# Patient Record
Sex: Male | Born: 1940
Health system: Southern US, Community
[De-identification: ages and names within clinical notes are randomized; demographics above are authoritative.]

## PROBLEM LIST (undated history)

## (undated) DIAGNOSIS — F1010 Alcohol abuse, uncomplicated: Secondary | ICD-10-CM

## (undated) DIAGNOSIS — R002 Palpitations: Secondary | ICD-10-CM

## (undated) DIAGNOSIS — R0600 Dyspnea, unspecified: Secondary | ICD-10-CM

## (undated) DIAGNOSIS — N529 Male erectile dysfunction, unspecified: Secondary | ICD-10-CM

## (undated) DIAGNOSIS — N400 Enlarged prostate without lower urinary tract symptoms: Secondary | ICD-10-CM

## (undated) DIAGNOSIS — K219 Gastro-esophageal reflux disease without esophagitis: Secondary | ICD-10-CM

## (undated) DIAGNOSIS — Z9289 Personal history of other medical treatment: Secondary | ICD-10-CM

## (undated) DIAGNOSIS — I4892 Unspecified atrial flutter: Secondary | ICD-10-CM

## (undated) DIAGNOSIS — R972 Elevated prostate specific antigen [PSA]: Secondary | ICD-10-CM

## (undated) DIAGNOSIS — I779 Disorder of arteries and arterioles, unspecified: Secondary | ICD-10-CM

## (undated) DIAGNOSIS — M1990 Unspecified osteoarthritis, unspecified site: Secondary | ICD-10-CM

## (undated) DIAGNOSIS — C61 Malignant neoplasm of prostate: Secondary | ICD-10-CM

## (undated) DIAGNOSIS — I10 Essential (primary) hypertension: Secondary | ICD-10-CM

## (undated) DIAGNOSIS — J189 Pneumonia, unspecified organism: Secondary | ICD-10-CM

## (undated) DIAGNOSIS — C801 Malignant (primary) neoplasm, unspecified: Secondary | ICD-10-CM

## (undated) DIAGNOSIS — N281 Cyst of kidney, acquired: Secondary | ICD-10-CM

## (undated) DIAGNOSIS — E785 Hyperlipidemia, unspecified: Secondary | ICD-10-CM

## (undated) DIAGNOSIS — J449 Chronic obstructive pulmonary disease, unspecified: Secondary | ICD-10-CM

## (undated) DIAGNOSIS — I2510 Atherosclerotic heart disease of native coronary artery without angina pectoris: Secondary | ICD-10-CM

## (undated) HISTORY — DX: Chronic obstructive pulmonary disease, unspecified: J44.9

## (undated) HISTORY — DX: Essential (primary) hypertension: I10

## (undated) HISTORY — DX: Malignant neoplasm of prostate: C61

## (undated) HISTORY — PX: CATARACT EXTRACTION, BILATERAL: SHX1313

## (undated) HISTORY — DX: Elevated prostate specific antigen (PSA): R97.20

## (undated) HISTORY — PX: EYE SURGERY: SHX253

## (undated) HISTORY — DX: Disorder of arteries and arterioles, unspecified: I77.9

## (undated) HISTORY — DX: Personal history of other medical treatment: Z92.89

## (undated) HISTORY — DX: Unspecified atrial flutter: I48.92

## (undated) HISTORY — DX: Alcohol abuse, uncomplicated: F10.10

## (undated) HISTORY — DX: Palpitations: R00.2

## (undated) HISTORY — DX: Male erectile dysfunction, unspecified: N52.9

## (undated) HISTORY — DX: Atherosclerotic heart disease of native coronary artery without angina pectoris: I25.10

## (undated) HISTORY — DX: Hyperlipidemia, unspecified: E78.5

## (undated) HISTORY — DX: Benign prostatic hyperplasia without lower urinary tract symptoms: N40.0

## (undated) HISTORY — DX: Cyst of kidney, acquired: N28.1

## (undated) HISTORY — PX: MULTIPLE TOOTH EXTRACTIONS: SHX2053

## (undated) HISTORY — PX: TONSILLECTOMY: SUR1361

## (undated) HISTORY — DX: Gastro-esophageal reflux disease without esophagitis: K21.9

## (undated) HISTORY — PX: KNEE ARTHROSCOPY: SUR90

---

## 1999-09-12 ENCOUNTER — Emergency Department (HOSPITAL_COMMUNITY): Admission: EM | Admit: 1999-09-12 | Discharge: 1999-09-12 | Payer: Self-pay

## 2000-07-30 HISTORY — PX: CARDIAC CATHETERIZATION: SHX172

## 2001-06-02 ENCOUNTER — Ambulatory Visit (HOSPITAL_COMMUNITY): Admission: RE | Admit: 2001-06-02 | Discharge: 2001-06-02 | Payer: Self-pay | Admitting: *Deleted

## 2003-04-28 ENCOUNTER — Encounter: Admission: RE | Admit: 2003-04-28 | Discharge: 2003-04-28 | Payer: Self-pay | Admitting: Otolaryngology

## 2003-04-28 ENCOUNTER — Encounter: Payer: Self-pay | Admitting: Otolaryngology

## 2006-04-03 ENCOUNTER — Emergency Department (HOSPITAL_COMMUNITY): Admission: EM | Admit: 2006-04-03 | Discharge: 2006-04-03 | Payer: Self-pay | Admitting: Emergency Medicine

## 2006-12-25 ENCOUNTER — Encounter: Admission: RE | Admit: 2006-12-25 | Discharge: 2006-12-25 | Payer: Self-pay | Admitting: Orthopedic Surgery

## 2007-01-08 ENCOUNTER — Encounter: Admission: RE | Admit: 2007-01-08 | Discharge: 2007-01-08 | Payer: Self-pay | Admitting: Orthopedic Surgery

## 2008-02-09 ENCOUNTER — Encounter: Admission: RE | Admit: 2008-02-09 | Discharge: 2008-02-09 | Payer: Self-pay | Admitting: Otolaryngology

## 2009-06-26 ENCOUNTER — Emergency Department (HOSPITAL_COMMUNITY): Admission: EM | Admit: 2009-06-26 | Discharge: 2009-06-27 | Payer: Self-pay | Admitting: Emergency Medicine

## 2009-11-22 ENCOUNTER — Ambulatory Visit (HOSPITAL_COMMUNITY): Admission: RE | Admit: 2009-11-22 | Discharge: 2009-11-22 | Payer: Self-pay | Admitting: Orthopedic Surgery

## 2010-10-17 LAB — COMPREHENSIVE METABOLIC PANEL
Albumin: 4.1 g/dL (ref 3.5–5.2)
Alkaline Phosphatase: 93 U/L (ref 39–117)
BUN: 8 mg/dL (ref 6–23)
Chloride: 101 mEq/L (ref 96–112)
Glucose, Bld: 91 mg/dL (ref 70–99)
Potassium: 4.1 mEq/L (ref 3.5–5.1)
Total Bilirubin: 0.9 mg/dL (ref 0.3–1.2)

## 2010-10-17 LAB — DIFFERENTIAL
Basophils Absolute: 0 10*3/uL (ref 0.0–0.1)
Basophils Relative: 0 % (ref 0–1)
Eosinophils Absolute: 0.1 10*3/uL (ref 0.0–0.7)
Monocytes Absolute: 0.6 10*3/uL (ref 0.1–1.0)
Neutro Abs: 7.2 10*3/uL (ref 1.7–7.7)
Neutrophils Relative %: 77 % (ref 43–77)

## 2010-10-17 LAB — URINALYSIS, ROUTINE W REFLEX MICROSCOPIC
Bilirubin Urine: NEGATIVE
Hgb urine dipstick: NEGATIVE
Ketones, ur: NEGATIVE mg/dL
Nitrite: NEGATIVE
pH: 7.5 (ref 5.0–8.0)

## 2010-10-17 LAB — CBC
HCT: 51.6 % (ref 39.0–52.0)
Hemoglobin: 17.5 g/dL — ABNORMAL HIGH (ref 13.0–17.0)
WBC: 9.4 10*3/uL (ref 4.0–10.5)

## 2010-10-17 LAB — APTT: aPTT: 31 seconds (ref 24–37)

## 2010-12-04 ENCOUNTER — Emergency Department (HOSPITAL_COMMUNITY)
Admission: EM | Admit: 2010-12-04 | Discharge: 2010-12-04 | Disposition: A | Discharge status: Home / Self Care | Payer: Medicare Other | Attending: Emergency Medicine | Admitting: Emergency Medicine

## 2010-12-04 DIAGNOSIS — I491 Atrial premature depolarization: Secondary | ICD-10-CM | POA: Insufficient documentation

## 2010-12-04 DIAGNOSIS — K219 Gastro-esophageal reflux disease without esophagitis: Secondary | ICD-10-CM | POA: Insufficient documentation

## 2010-12-04 DIAGNOSIS — E785 Hyperlipidemia, unspecified: Secondary | ICD-10-CM | POA: Insufficient documentation

## 2010-12-04 DIAGNOSIS — F411 Generalized anxiety disorder: Secondary | ICD-10-CM | POA: Insufficient documentation

## 2010-12-04 DIAGNOSIS — F1010 Alcohol abuse, uncomplicated: Secondary | ICD-10-CM | POA: Insufficient documentation

## 2010-12-04 LAB — CBC
HCT: 44.1 % (ref 39.0–52.0)
Hemoglobin: 15.7 g/dL (ref 13.0–17.0)
MCH: 34.7 pg — ABNORMAL HIGH (ref 26.0–34.0)
MCHC: 35.6 g/dL (ref 30.0–36.0)
MCV: 97.6 fL (ref 78.0–100.0)
RDW: 14.2 % (ref 11.5–15.5)

## 2010-12-04 LAB — DIFFERENTIAL
Basophils Absolute: 0.1 10*3/uL (ref 0.0–0.1)
Eosinophils Relative: 2 % (ref 0–5)
Lymphocytes Relative: 20 % (ref 12–46)
Lymphs Abs: 1.3 10*3/uL (ref 0.7–4.0)
Monocytes Absolute: 0.7 10*3/uL (ref 0.1–1.0)
Monocytes Relative: 11 % (ref 3–12)

## 2010-12-04 LAB — COMPREHENSIVE METABOLIC PANEL
BUN: 8 mg/dL (ref 6–23)
CO2: 29 mEq/L (ref 19–32)
Calcium: 9 mg/dL (ref 8.4–10.5)
GFR calc non Af Amer: >60 mL/min (ref >60)
Glucose, Bld: 67 mg/dL — ABNORMAL LOW (ref 70–99)
Total Protein: 6.5 g/dL (ref 6.0–8.3)

## 2010-12-04 LAB — RAPID URINE DRUG SCREEN, HOSP PERFORMED
Barbiturates: NOT DETECTED
Benzodiazepines: NOT DETECTED
Cocaine: NOT DETECTED

## 2010-12-04 LAB — ETHANOL: Alcohol, Ethyl (B): <11 mg/dL — ABNORMAL HIGH (ref 0–10)

## 2010-12-15 NOTE — Cardiovascular Report (Signed)
[place]  Patient:    SAVAN, RUTA Visit Number: [id number] MRN: [id number]          Service Type: CAT Location: [facility] 2874 [id number] Attending Physician:  Meade Maw A Dictated by:   Lemont Fillers Fraser Din, M.D. Proc. Date: 06/02/01 Admit Date:  06/02/2001 Discharge Date: 06/02/2001   CC:         Pearla Dubonnet, M.D.   Cardiac Catheterization  REFERRING PHYSICIAN: Pearla Dubonnet, M.D.  INDICATIONS FOR PROCEDURE: Positive stress test with development of jaw pain at 4 minutes into the test with nonspecific ST changes up to 1 mm horizontal ST depression at peak exercise.  DESCRIPTION OF PROCEDURE: After obtaining written informed consent, the patient was brought to the cardiac catheterization lab in the postabsorptive state. Preoperative sedation was achieved with IV Versed. The right groin was was prepped and draped in the usual sterile fashion. Local anesthesia was achieved using 1% Xylocaine. A 6 French hemostasis sheath was placed into the right femoral artery using a modified Seldinger technique.  Selective coronary angiography was performed using a JL4, JR4 Judkins catheters. Multiple views were obtained. All catheter exchange were made over a guide wire. Single plane ventriculogram was performed in the RAO position using a 6 French pigtail curved catheter.  FINDINGS: The aortic pressure was 123/66, LV pressure was 128/17. There was no gradient noted on pullback. Single plane ventriculogram revealed normal wall motion with an ejection fraction of 65%. There was no MR noted.  CORONARY ANGIOGRAPHY: Left main coronary artery: The left main coronary artery bifurcated into the left anterior descending and circumflex vessel. There was no significant disease in the left main coronary artery.  Left anterior descending: The left anterior descending gives rise to a small diagonal #1, large bifurcating diagonal #2, and goes on to end as an  apical branch. 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  Circumflex vessel: The circumflex vessel gives rise to a large bifurcating OM-1 and then ends as an AV groove vessel. There is no significant disease in the circumflex or its branches.  Right coronary artery: The right coronary artery is a large dominant artery giving rise to several RV marginals, PDA and PL branch. There is a long proximal lesion of up to 30 mm noted in the right coronary artery of 50% stenosis.  IMPRESSION: Borderline proximal disease in the right coronary artery. The films were reviewed with Dr. Corliss Marcus. The disease did not appear to be critical for intervention and it was felt that percutaneous revascularization with a long stent placement would result in more harm that good.  RECOMMENDATIONS: To continue with medical therapy and to keep LDL less than 100. Should he have significant ongoing pain which inhibits his ability to exercise, we will perform a stress Cardiolite for further evaluation. If the territory of the right coronary artery is noted to have problems, we will consider further intervention in that region. Dictated by:   Lemont Fillers Fraser Din, M.D. Attending Physician:  Meade Maw A DD:  06/02/01 TD:  06/03/01 Job: [id number]

## 2012-07-09 ENCOUNTER — Encounter (INDEPENDENT_AMBULATORY_CARE_PROVIDER_SITE_OTHER): Payer: Medicare Other | Admitting: Ophthalmology

## 2012-07-14 ENCOUNTER — Encounter (INDEPENDENT_AMBULATORY_CARE_PROVIDER_SITE_OTHER): Payer: Medicare Other | Admitting: Ophthalmology

## 2012-07-14 DIAGNOSIS — H43819 Vitreous degeneration, unspecified eye: Secondary | ICD-10-CM

## 2012-07-14 DIAGNOSIS — D3130 Benign neoplasm of unspecified choroid: Secondary | ICD-10-CM

## 2012-07-14 DIAGNOSIS — H33309 Unspecified retinal break, unspecified eye: Secondary | ICD-10-CM

## 2012-07-16 ENCOUNTER — Encounter (INDEPENDENT_AMBULATORY_CARE_PROVIDER_SITE_OTHER): Payer: Medicare Other | Admitting: Ophthalmology

## 2012-07-16 DIAGNOSIS — H33309 Unspecified retinal break, unspecified eye: Secondary | ICD-10-CM

## 2012-08-01 ENCOUNTER — Ambulatory Visit (INDEPENDENT_AMBULATORY_CARE_PROVIDER_SITE_OTHER): Payer: Medicare Other | Admitting: Ophthalmology

## 2012-08-01 DIAGNOSIS — H33309 Unspecified retinal break, unspecified eye: Secondary | ICD-10-CM

## 2012-12-01 ENCOUNTER — Ambulatory Visit (INDEPENDENT_AMBULATORY_CARE_PROVIDER_SITE_OTHER): Payer: Medicare Other | Admitting: Ophthalmology

## 2013-02-02 ENCOUNTER — Ambulatory Visit (INDEPENDENT_AMBULATORY_CARE_PROVIDER_SITE_OTHER): Payer: Self-pay | Admitting: Ophthalmology

## 2013-07-15 ENCOUNTER — Encounter: Payer: Self-pay | Admitting: Podiatry

## 2013-07-15 ENCOUNTER — Ambulatory Visit (INDEPENDENT_AMBULATORY_CARE_PROVIDER_SITE_OTHER): Payer: Medicare Other | Admitting: Podiatry

## 2013-07-15 VITALS — Ht 71.0 in | Wt 170.0 lb

## 2013-07-15 DIAGNOSIS — Q828 Other specified congenital malformations of skin: Secondary | ICD-10-CM

## 2013-07-15 NOTE — Progress Notes (Signed)
Patient ID: [name] [name], male   DOB: [birth year], 72 y.o.   MRN: [id number]  Subjective: This patient presents for ongoing debridement of a painful skin lesion on the plantar aspect of the left foot.  Objective: Hallux limitus left. Nucleated hyperkeratotic lesion plantar interphalangeal joint left hallux.  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  Plan: The hyperkeratotic tissue was debrided, packed with salinocaine. Return at patient request.

## 2013-10-05 ENCOUNTER — Ambulatory Visit: Payer: Medicare Other | Admitting: Podiatry

## 2014-09-28 ENCOUNTER — Other Ambulatory Visit: Payer: Self-pay | Admitting: Internal Medicine

## 2014-09-28 DIAGNOSIS — N281 Cyst of kidney, acquired: Secondary | ICD-10-CM

## 2014-09-29 ENCOUNTER — Ambulatory Visit
Admission: RE | Admit: 2014-09-29 | Discharge: 2014-09-29 | Disposition: A | Discharge status: Home / Self Care | Payer: Medicare Other | Source: Ambulatory Visit | Attending: Internal Medicine | Admitting: Internal Medicine

## 2014-09-29 DIAGNOSIS — N281 Cyst of kidney, acquired: Secondary | ICD-10-CM

## 2015-04-25 ENCOUNTER — Other Ambulatory Visit: Payer: Self-pay | Admitting: Acute Care

## 2015-04-25 ENCOUNTER — Encounter: Payer: Self-pay | Admitting: Acute Care

## 2015-04-25 DIAGNOSIS — F17210 Nicotine dependence, cigarettes, uncomplicated: Secondary | ICD-10-CM

## 2015-04-25 NOTE — Progress Notes (Signed)
Patient ID: [name] [name], male   DOB: October 28, 1940, 74 y.o.   MRN: [id number]     Cardiology Office Note   Date:  04/25/2015   ID:  [name] [name], DOB 1941-07-14, MRN [id number]  PCP:  Henrine Screws, MD  Cardiologist:   Jenkins Rouge, MD   No chief complaint on file.     History of Present Illness: [name] [name] is a 74 y.o. male who presents for evaluation of CAD.  As far as I can tell has not been evaluated in a long time  Seen by Keturah Barre In 2002.  Reviewed cath and he only had a 50% proximal RCA lesion that was long.  CRF;s elevated lipids on statin , HTN and smoking  35 pack year history Referred To radiology for low dose CT screening.  No chest pain but progressively more exertional dyspnea COPD described as severe with multiple inhalers.  Last [facility] cardiology Notes we could find showed normal myovue in 06/21/2003 with EF 68%  ECG portion of exercise stress test also reviewed and normal with 6 minutes of exercise Max HR not Reported   Limited by COPD just started on albuterol and advair with significant improvement.  Unable to walk on treadmill due to chronic left foot pain and great toe deformity. Has had an episode of SSCP while golfing 3 weeks ago.  Self limited central radiated to back Resolved spontaneously no pleuritic or positional component     Past Medical History  Diagnosis Date  . GERD (gastroesophageal reflux disease)   . COPD (chronic obstructive pulmonary disease)   . Coronary artery disease   . Hypertension   . Alcohol abuse   . Hyperlipidemia   . Elevated PSA   . BPH with elevated PSA   . Kidney cysts   . ED (erectile dysfunction)   . Palpitations     Past Surgical History  Procedure Laterality Date  . Tonsillectomy    . Multiple tooth extractions    . Cardiac catheterization  2002    50% RCA  . Knee arthroscopy    . Cataract extraction, bilateral       Current Outpatient Prescriptions    Medication Sig Dispense Refill  . albuterol (PROAIR HFA) 108 (90 BASE) MCG/ACT inhaler Inhale 2 puffs into the lungs every 4 (four) hours as needed. Breathing problems    . EPINEPHrine 0.3 mg/0.3 mL IJ SOAJ injection Inject 0.3 mg as directed once. Use as directed    . esomeprazole (NEXIUM) 20 MG capsule Take 20 mg by mouth daily at 12 noon.    . fluticasone-salmeterol (ADVAIR HFA) 115-21 MCG/ACT inhaler Inhale 2 puffs into the lungs daily.    . nitroGLYCERIN (NITROSTAT) 0.4 MG SL tablet Place 1 tablet under the tongue. Every 5 minutes, max 3    . rosuvastatin (CRESTOR) 5 MG tablet Take 10 mg by mouth daily.    Marland Kitchen Umeclidinium Bromide (INCRUSE ELLIPTA) 62.5 MCG/INH AEPB Inhale 1 puff into the lungs as needed.      No current facility-administered medications for this visit.    Allergies:   Flagyl     Social History:  The patient  reports that he has been smoking Cigarettes.  He has a 40 pack-year smoking history. He has never used smokeless tobacco. He reports that he does not drink alcohol or use illicit drugs.   Family History:  The patient's family history includes Alzheimer's disease in his mother; Hypertension in his mother.  ROS:  Please see the history of present illness.   Otherwise, review of systems are positive for none.   All other systems are reviewed and negative.    PHYSICAL EXAM: VS:  There were no vitals taken for this visit. , BMI There is no weight on file to calculate BMI. Affect appropriate Healthy:  appears stated age 58: normal Neck supple with no adenopathy JVP normal no bruits no thyromegaly Lungs Poor air movement exp wheezing and good diaphragmatic motion Heart:  S1/S2 no murmur, no rub, gallop or click PMI normal Abdomen: benighn, BS positve, no tenderness, no AAA no bruit.  No HSM or HJR Distal pulses intact with no bruits No edema Neuro non-focal Skin warm and dry No muscular weakness    EKG:   2015 SR rate 83  PAC otherwise normal    04/26/15  SR rate 78  RAD pulmonary disease pattern    Recent Labs: No results found for requested labs within last 365 days.    Lipid Panel No results found for: CHOL, TRIG, HDL, CHOLHDL, VLDL, LDLCALC, LDLDIRECT    Wt Readings from Last 3 Encounters:  07/15/13 77.111 kg (170 lb)      Other studies Reviewed: Additional studies/ records that were reviewed today include:  [facility] notes echo 2012 and myovue 2014 .    ASSESSMENT AND PLAN:  1.  Chest pain  Cath 2002 with 50% RCA.  Still smoking Isolated episode. ECG ok  F/u lexiscan myovue. 2. COPD long talk about smoking and ongoing risk of cancer and need for oxgyen.  He is getting a low dose screening CT Breathing much better with advair and albuterol 3. Chol:  On statin f/u labs primary   Current medicines are reviewed at length with the patient today.  The patient does not have concerns regarding medicines.  The following changes have been made:  no change  Labs/ tests ordered today include: Lexiscan myovue  No orders of the defined types were placed in this encounter.     Disposition:   FU with me in a  Year if myovue normal      Signed, Jenkins Rouge, MD  04/25/2015 3:19 PM    [facility] [place], [place], [place]  [address] Phone: [phone]; Fax: [phone]

## 2015-04-26 ENCOUNTER — Encounter: Payer: Self-pay | Admitting: Cardiovascular Disease

## 2015-04-26 ENCOUNTER — Ambulatory Visit (INDEPENDENT_AMBULATORY_CARE_PROVIDER_SITE_OTHER): Payer: Medicare Other | Admitting: Cardiovascular Disease

## 2015-04-26 VITALS — BP 106/54 | HR 78 | Ht 70.0 in | Wt 163.4 lb

## 2015-04-26 DIAGNOSIS — I998 Other disorder of circulatory system: Secondary | ICD-10-CM

## 2015-04-26 NOTE — Patient Instructions (Addendum)
Medication Instructions:  Your physician recommends that you continue on your current medications as directed. Please refer to the Current Medication list given to you today.  Labwork: NONE Testing/Procedures: Your physician has requested that you have a lexiscan myoview. For further information please visit [email or web address]. Please follow instruction sheet, as given.   Follow-Up: Your physician wants you to follow-up TC:YELY  WITH  DR Blima Singer will receive a reminder letter in the mail two months in advance. If you don't receive a letter, please call our office to schedule the follow-up appointment.   Any Other Special Instructions Will Be Listed Below (If Applicable).

## 2015-05-04 ENCOUNTER — Ambulatory Visit (INDEPENDENT_AMBULATORY_CARE_PROVIDER_SITE_OTHER): Payer: Medicare Other | Admitting: Podiatry

## 2015-05-04 ENCOUNTER — Encounter: Payer: Self-pay | Admitting: Podiatry

## 2015-05-04 DIAGNOSIS — Q828 Other specified congenital malformations of skin: Secondary | ICD-10-CM | POA: Diagnosis not present

## 2015-05-04 NOTE — Progress Notes (Signed)
   Subjective:    Patient ID: [name] [name], male    DOB: 1941/04/12, 74 y.o.   MRN: [id number]  HPI Patient presents today complaining of a painful plantar skin lesion on the left hallux gradually over the last several months. He was last seen for a similar problem for debridement on 07/15/2013   Review of Systems  All other systems reviewed and are negative.      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 Patient is advised that lesion is associated with hallux limitus rigidus Attached felt pad on patient's existing orthotic to pad off plantar left hallux interphalangeal joint . Return as needed for debridement

## 2015-05-04 NOTE — Patient Instructions (Signed)
The day I trimmed a nucleated corn on the bottom of the left great toe This area forms because the first great toe joint is fused are rigid and the area where the corn forms is trying to take the slack up creating excessive pressure in that area Removed a medicated pad on the left great toe in 1-2 days

## 2015-05-09 ENCOUNTER — Encounter: Payer: Self-pay | Admitting: Acute Care

## 2015-05-09 ENCOUNTER — Telehealth: Payer: Self-pay | Admitting: Acute Care

## 2015-05-09 ENCOUNTER — Ambulatory Visit (INDEPENDENT_AMBULATORY_CARE_PROVIDER_SITE_OTHER): Payer: Medicare Other | Admitting: Acute Care

## 2015-05-09 ENCOUNTER — Ambulatory Visit
Admission: RE | Admit: 2015-05-09 | Discharge: 2015-05-09 | Disposition: A | Discharge status: Home / Self Care | Payer: Medicare Other | Source: Ambulatory Visit | Attending: Acute Care | Admitting: Acute Care

## 2015-05-09 DIAGNOSIS — F17210 Nicotine dependence, cigarettes, uncomplicated: Secondary | ICD-10-CM

## 2015-05-09 NOTE — Progress Notes (Signed)
Shared Decision Making Visit Lung Cancer Screening Program [phone])   Eligibility:  Age 74 y.o.  Pack Years Smoking History Calculation 40 pack year (# packs/per year x # years smoked)  Recent History of coughing up blood  No  Unexplained weight loss? no ( >Than 15 pounds within the last 6 months )  Prior History Lung / other cancer no (Diagnosis within the last 5 years already requiring surveillance chest CT Scans).  Smoking Status Current Smoker  Former Smokers: Years since quit:NA  Quit Date: NA  Visit Components:  Discussion included one or more decision making aids. yes  Discussion included risk/benefits of screening. yes  Discussion included potential follow up diagnostic testing for abnormal scans. yes  Discussion included meaning and risk of over diagnosis. yes  Discussion included meaning and risk of False Positives. yes  Discussion included meaning of total radiation exposure. yes  Counseling Included:  Importance of adherence to annual lung cancer LDCT screening. yes  Impact of comorbidities on ability to participate in the program. yes  Ability and willingness to under diagnostic treatment. yes  Smoking Cessation Counseling:  Current Smokers:   Discussed importance of smoking cessation. yes  Information about tobacco cessation classes and interventions provided to patient. yes  Patient provided with "ticket" for LDCT Scan. yes  Symptomatic Patient. no  Counseling  Diagnosis Code: Tobacco Use Z72.0  Asymptomatic Patient yes  Counseling (Intermediate counseling: > three minutes counseling) P3790  Former Smokers:   Discussed the importance of maintaining cigarette abstinence. NA: current smoker.  Diagnosis Code: Personal History of Nicotine Dependence. W40.973  Information about tobacco cessation classes and interventions provided to patient. Yes  Patient provided with "ticket" for LDCT Scan. yes  Written Order for Lung Cancer  Screening with LDCT placed in Epic. Yes (CT Chest Lung Cancer Screening Low Dose W/O CM) [id number] Z12.2-Screening of respiratory organs Z87.891-Personal history of nicotine dependence  I spent 15 minutes of face to face time with Mr. Taras discussing the risks and benefits of lung cancer screening. We viewed a power point discussing the above noted topics, pausing at intervals to allow for questions to be asked and answered to ensure understanding. We discussed that the single most powerful action that he can take to decrease his risk of lung cancer is to stop smoking. We discussed classes available through both [place] and the public health department that may aid in his goal of becoming smoke free. He did quit at one time in the past by using behavior modification. He is currently in the process of trying to decrease his number of cigarettes by using a vapor system.We discussed the use of nicotine replacement therapy in addition to medication to assist liberating himself from cigarettes. He did site the cost of nicotine replacement therapy as a barrier to using that mode. I have given him the " Be stronger than your excuses" card with information about nicotine replacement therapy through [facility], which provides it for free. He has not set a quit date, but is in the process of doing so. He has my card and contact information. I have told him to call me and I will help him in any way I can to meet his goal. We discussed the time and location of his scan, and that I will call him with the results in the next 48 hours.I also gave him a copy of the power point we reviewed together in the event he has any questions in the future. He verbalized  understanding of all of the above and had no further questions before leaving the office.   Magdalen Spatz, NP

## 2015-05-09 NOTE — Telephone Encounter (Signed)
I called Mr. Fidalgo to give him the results of his LDCT scan. I explained to him that his scan was read as a Lung RADs 2, nodules with a very low likelihood of becoming a clinically active cancer due to lack of size or growth. I explained that the recommendation was for his to have a repeat scan in 12 months to continue to monitor nodules for any changes. I told him we will call him 3 weeks before the scan is due in Oct. Of 2017 to schedule the scan. I also told him I would fax the results of the scan to Dr. [name]. He verbalized understanding of all of the above and had no further questions. He has my contact information in the event he has any questions for me in the future. I reminded him to call Dr. [name] if he has any changes in his health that involve blood in his sputum when he coughs, or weight loss that is unintentional.He verbalized understanding.

## 2015-05-11 ENCOUNTER — Telehealth (HOSPITAL_COMMUNITY): Payer: Self-pay | Admitting: *Deleted

## 2015-05-11 NOTE — Telephone Encounter (Signed)
Left message with spouse in reference to upcoming appointment scheduled for 05/16/15. Phone number given for a call back so details instructions can be given. Hubbard Robinson, RN

## 2015-05-12 ENCOUNTER — Telehealth (HOSPITAL_COMMUNITY): Payer: Self-pay

## 2015-05-12 NOTE — Telephone Encounter (Signed)
Left message on voicemail in reference to upcoming appointment scheduled for 05-16-2015. Phone number given for a call back so details instructions can be given. Oletta Lamas, Louanne Calvillo A

## 2015-05-13 ENCOUNTER — Telehealth (HOSPITAL_COMMUNITY): Payer: Self-pay | Admitting: *Deleted

## 2015-05-13 NOTE — Telephone Encounter (Signed)
Patient given detailed instructions per Myocardial Perfusion Study Information Sheet for test on 05/16/15 at 12:30. Patient notified to arrive 15 minutes early and that it is imperative to arrive on time for appointment to keep from having the test rescheduled.  If you need to cancel or reschedule your appointment, please call the office within 24 hours of your appointment. Failure to do so may result in a cancellation of your appointment, and a $50 no show fee. Patient verbalized understanding. [name]

## 2015-05-13 NOTE — Telephone Encounter (Signed)
Left message on voicemail in reference to upcoming appointment scheduled for 05/16/15. Phone number given for a call back so details instructions can be given. [name] [name], Ranae Palms

## 2015-05-16 ENCOUNTER — Ambulatory Visit (HOSPITAL_COMMUNITY): Payer: Medicare Other | Attending: Cardiovascular Disease

## 2015-05-16 DIAGNOSIS — R0609 Other forms of dyspnea: Secondary | ICD-10-CM | POA: Insufficient documentation

## 2015-05-16 DIAGNOSIS — I998 Other disorder of circulatory system: Secondary | ICD-10-CM | POA: Insufficient documentation

## 2015-05-16 DIAGNOSIS — I10 Essential (primary) hypertension: Secondary | ICD-10-CM | POA: Insufficient documentation

## 2015-05-16 DIAGNOSIS — R9439 Abnormal result of other cardiovascular function study: Secondary | ICD-10-CM | POA: Insufficient documentation

## 2015-05-16 DIAGNOSIS — R079 Chest pain, unspecified: Secondary | ICD-10-CM | POA: Diagnosis not present

## 2015-05-16 DIAGNOSIS — R002 Palpitations: Secondary | ICD-10-CM | POA: Insufficient documentation

## 2015-05-16 DIAGNOSIS — F17200 Nicotine dependence, unspecified, uncomplicated: Secondary | ICD-10-CM | POA: Insufficient documentation

## 2015-05-16 DIAGNOSIS — I2510 Atherosclerotic heart disease of native coronary artery without angina pectoris: Secondary | ICD-10-CM | POA: Diagnosis not present

## 2015-05-16 LAB — MYOCARDIAL PERFUSION IMAGING
CHL CUP NUCLEAR SSS: 0
CHL CUP RESTING HR STRESS: 64 {beats}/min
CSEPPHR: 88 {beats}/min
LV dias vol: 91 mL
LVSYSVOL: 41 mL
NUC STRESS TID: 1.19
RATE: 0.28
SDS: 0
SRS: 0

## 2015-05-16 MED ORDER — THALLOUS CHLORIDE TL 201 1 MCI/ML IV SOLN: 4.0000 | Freq: Once | INTRAVENOUS | Status: DC | PRN

## 2015-05-16 MED ORDER — TECHNETIUM TC 99M SESTAMIBI GENERIC - CARDIOLITE
10.4000 | Freq: Once | INTRAVENOUS | Status: AC | PRN
  Administered 2015-05-16: 10 via INTRAVENOUS

## 2015-05-16 MED ORDER — REGADENOSON 0.4 MG/5ML IV SOLN
0.4000 mg | Freq: Once | INTRAVENOUS | Status: AC
  Administered 2015-05-16: 0.4 mg via INTRAVENOUS

## 2015-05-16 MED ORDER — TECHNETIUM TC 99M SESTAMIBI GENERIC - CARDIOLITE
32.1000 | Freq: Once | INTRAVENOUS | Status: AC | PRN
  Administered 2015-05-16: 32.1 via INTRAVENOUS

## 2015-05-26 ENCOUNTER — Encounter: Payer: Self-pay | Admitting: Cardiovascular Disease

## 2015-05-27 ENCOUNTER — Encounter: Payer: Self-pay | Admitting: Cardiovascular Disease

## 2015-05-30 ENCOUNTER — Other Ambulatory Visit: Payer: Self-pay | Admitting: *Deleted

## 2015-05-30 MED ORDER — ROSUVASTATIN CALCIUM 10 MG PO TABS: 10.0000 mg | ORAL_TABLET | Freq: Every day | ORAL | Status: DC

## 2015-10-18 ENCOUNTER — Other Ambulatory Visit: Payer: Self-pay | Admitting: Acute Care

## 2015-10-18 DIAGNOSIS — F17210 Nicotine dependence, cigarettes, uncomplicated: Secondary | ICD-10-CM

## 2016-05-21 ENCOUNTER — Encounter: Payer: Self-pay | Admitting: Acute Care

## 2016-06-01 ENCOUNTER — Ambulatory Visit
Admission: RE | Admit: 2016-06-01 | Discharge: 2016-06-01 | Disposition: A | Discharge status: Home / Self Care | Payer: Medicare Other | Source: Ambulatory Visit | Attending: Acute Care | Admitting: Acute Care

## 2016-06-01 DIAGNOSIS — F17210 Nicotine dependence, cigarettes, uncomplicated: Secondary | ICD-10-CM

## 2016-06-05 ENCOUNTER — Telehealth: Payer: Self-pay | Admitting: Acute Care

## 2016-06-05 NOTE — Telephone Encounter (Signed)
I have called Mr. Mourning with the results of his low-dose CT scan. I explained that his scan was read as a Lung RADS 4 B which indicates suspicious findings for which additional diagnostic testing and or tissue sampling is recommended. I explained that there were 5 new nodules that were not noted on last year scan. I explained the next steps would be to come in and meet with Dr. Baltazar Apo, pulmonologist , for consultation. We have made an appointment for Friday, November 10 at 9 AM with Dr. Lamonte Sakai. I explained to Mr. Colaluca that this is first step in determining if we need diagnostic follow-up of these new nodules. I explained that this will allow Dr. Lamonte Sakai to review the scan with him and go over options for next step in regard to tissue sampling and eventual diagnosis. Mr. Grech verbalized understanding of the above. I confirmed the address for the appointment, and that pulmonary was located on the second floor. He has verbalized understanding of the above and had no further questions. I did tell him that I would get in touch with his primary care physician Dr. Josetta Huddle and give him the results of the scan and let him know that the patient is scheduled to be seen by Dr. Lamonte Sakai on Friday. Mr. Winget had no further questions at completion of the call.

## 2016-06-05 NOTE — Telephone Encounter (Signed)
I have called [name] [name]'s primary care physician Dr. Josetta Huddle to update him on the results of the low-dose screening CT. I explained to him that the scan was read as a Lung RADS 4 B indicates suspicious findings for which additional diagnostic testing and or tissue sampling is recommended. I told him that I have [name] [name] scheduled to see Dr. Baltazar Apo this Friday, 06/08/2016 at 9 AM for consultation regarding next steps for imaging and potential tissue sampling. I did tell Dr. [name] that I have called the patient with the results of the scan. I also told Dr. [name] that we would make sure he got a copy of the office visit with Dr. Lamonte Sakai, and that we would make sure he was aware of neck steps and plans moving forward. Dr. [name] was in agreement with this plan.

## 2016-06-08 ENCOUNTER — Encounter: Payer: Self-pay | Admitting: Emergency Medicine

## 2016-06-08 ENCOUNTER — Ambulatory Visit (INDEPENDENT_AMBULATORY_CARE_PROVIDER_SITE_OTHER): Payer: Medicare Other | Admitting: Emergency Medicine

## 2016-06-08 DIAGNOSIS — R918 Other nonspecific abnormal finding of lung field: Secondary | ICD-10-CM

## 2016-06-08 DIAGNOSIS — J449 Chronic obstructive pulmonary disease, unspecified: Secondary | ICD-10-CM | POA: Diagnosis not present

## 2016-06-08 DIAGNOSIS — Z720 Tobacco use: Secondary | ICD-10-CM

## 2016-06-08 HISTORY — DX: Other nonspecific abnormal finding of lung field: R91.8

## 2016-06-08 NOTE — Assessment & Plan Note (Signed)
Has been using Anoro or breo or Incruse depending on samples from Dr gates. Currently on Anoro.

## 2016-06-08 NOTE — Patient Instructions (Signed)
Continue your Anoro as you are taking it.  Continue your chantix  Work hard on decreasing your cigarettes.  We will perform a PET scan to better evaluate your pulmonary nodules.  Follow with Dr Lamonte Sakai next available after the PET scan

## 2016-06-08 NOTE — Assessment & Plan Note (Signed)
We will perform a PET scan to better evaluate the pulmonary nodules. Depending on this result we will either arrange for a high-resolution CT to facilitate navigational bronchoscopy or we will wait for 3 months to repeat a CT to look for interval change. I will discuss this with him after the PET scan is done.

## 2016-06-08 NOTE — Assessment & Plan Note (Signed)
Discussed cessations. He is about to run out of chantix. We'll work on trying to help him find financial assistance for this.

## 2016-06-08 NOTE — Progress Notes (Signed)
Subjective:    Patient ID: [name] [name], male    DOB: 11-18-1940, 75 y.o.   MRN: [id number]  HPI 75 yo man with hx tobacco use (40 pack years), hx CAD, HTN, hyperlipidemia, COPD diagnosed in . Currently managed w samples of either Anoro or Breo, Incruse. He has been dissipating in the low-dose CT scan lung cancer screening program since October 2016. He had a repeat scan 06/02/16 that I personally reviewed. This scan identified new nodules compared with one year prior. There was an 11.7 mm left upper lobe pulmonary nodule, irregular 14.9 mm per segmental right lower lobe nodule and 13.1 mm posterior right upper lobe nodule with some central clearing. He is evaluated today for evaluation of the nodules. He tells me that he had a URI vs AE-COPD in September - this had cleared by the time the scan was done.   He has been trying to quit smoking - has been on chantix x 30 days.  No weight loss, decreased cough occas prod at night, no hemoptysis.    Review of Systems  Constitutional: Negative for fever and unexpected weight change.  HENT: Positive for dental problem. Negative for congestion, ear pain, nosebleeds, postnasal drip, rhinorrhea, sinus pressure, sneezing, sore throat and trouble swallowing.   Eyes: Negative for redness and itching.  Respiratory: Positive for cough and shortness of breath. Negative for chest tightness and wheezing.   Cardiovascular: Negative for palpitations and leg swelling.  Gastrointestinal: Negative for nausea and vomiting.  Genitourinary: Negative for dysuria.  Musculoskeletal: Negative for joint swelling.  Skin: Negative for rash.  Neurological: Negative for headaches.  Hematological: Does not bruise/bleed easily.  Psychiatric/Behavioral: Negative for dysphoric mood. The patient is not nervous/anxious.     Past Medical History:  Diagnosis Date  . Alcohol abuse   . BPH with elevated PSA   . COPD (chronic obstructive pulmonary disease) ([place])   .  Coronary artery disease   . ED (erectile dysfunction)   . Elevated PSA   . GERD (gastroesophageal reflux disease)   . Hyperlipidemia   . Hypertension   . Kidney cysts   . Palpitations      Family History  Problem Relation Age of Onset  . Hypertension Mother   . Alzheimer's disease Mother      Social History   Social History  . Marital status: Married    Spouse name: N/A  . Number of children: N/A  . Years of education: N/A   Occupational History  . Not on file.   Social History Main Topics  . Smoking status: Current Every Day Smoker    Packs/day: 0.25    Years: 40.00    Types: Cigarettes  . Smokeless tobacco: Never Used     Comment: Currently smokes 1/2 pack per day.  . Alcohol use No  . Drug use: No  . Sexual activity: Not on file   Other Topics Concern  . Not on file   Social History Narrative  . 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  stays active, likes to golf   Allergies  Allergen Reactions  . Flagyl  [Metronidazole] Other (See Comments)     Outpatient Medications Prior to Visit  Medication Sig Dispense Refill  . esomeprazole (NEXIUM) 20 MG capsule Take 20 mg by mouth daily at 12 noon.    Marland Kitchen albuterol (PROAIR HFA) 108 (90 BASE) MCG/ACT inhaler Inhale 2 puffs into the lungs  every 4 (four) hours as needed. Breathing problems    . ANORO ELLIPTA 62.5-25 MCG/INH AEPB     . BREO ELLIPTA 200-25 MCG/INH AEPB     . EPINEPHrine 0.3 mg/0.3 mL IJ SOAJ injection Inject 0.3 mg as directed once. Use as directed    . fluticasone-salmeterol (ADVAIR HFA) 115-21 MCG/ACT inhaler Inhale 2 puffs into the lungs daily.    . rosuvastatin (CRESTOR) 10 MG tablet Take 1 tablet (10 mg total) by mouth daily. 90 tablet 3  . Umeclidinium Bromide (INCRUSE ELLIPTA) 62.5 MCG/INH AEPB Inhale 1 puff into the lungs daily as needed (for wheezing & SOB).      Facility-Administered Medications Prior to Visit  Medication Dose Route Frequency  Provider Last Rate Last Dose  . thallous chloride (201 THALLIUM) injection 4 milli Curie  4 millicurie Intravenous Once PRN Larey Dresser, MD            Objective:   Physical Exam Vitals:   06/08/16 0858  Weight: 162 lb (73.5 kg)  Height: 5' 10.5" (1.791 m)   Gen: Pleasant, well-nourished, in no distress,  normal affect  ENT: No lesions,  mouth clear,  oropharynx clear, no postnasal drip  Neck: No JVD, no TMG, no carotid bruits  Lungs: No use of accessory muscles, no dullness to percussion, clear without rales or rhonchi  Cardiovascular: RRR, heart sounds normal, no murmur or gallops, no peripheral edema  Musculoskeletal: No deformities, no cyanosis or clubbing  Neuro: alert, non focal  Skin: Warm, no lesions or rashes   06/02/16 --  COMPARISON:  05/09/2015 screening chest CT.  FINDINGS: Cardiovascular: Normal heart size. No significant pericardial fluid/thickening. Left main, left anterior descending and right coronary atherosclerosis. Atherosclerotic nonaneurysmal thoracic aorta. Normal caliber pulmonary arteries.  Mediastinum/Nodes: No discrete thyroid nodules. Unremarkable esophagus. No pathologically enlarged axillary, mediastinal or gross hilar lymph nodes, noting limited sensitivity for the detection of hilar adenopathy on this noncontrast study.  Lungs/Pleura: No pneumothorax. No pleural effusion. Moderate centrilobular emphysema with diffuse bronchial wall thickening. The previously described pulmonary nodules have not significantly changed. There are 5 new pulmonary nodules, with the largest as follows:  - left upper lobe nodule measuring 11.7 mm in volume derived mean diameter (series 3/ image 109)  - irregular posterior superior segment right lower lobe nodule measuring 14.9 mm in volume derived mean diameter (series 3/ image 134)  - irregular posterior right upper lobe nodule measuring 13.1 mm in volume derived mean diameter (series 3/  image 130)  Upper abdomen: Unremarkable.  Musculoskeletal: No aggressive appearing focal osseous lesions. Moderate to marked thoracic spondylosis.  IMPRESSION: 1. Lung-RADS Category 4B-S, suspicious. Five new pulmonary nodules, including solid 11.7 mm left upper lobe pulmonary nodule and irregular 14.9 mm superior segment right lower lobe and 13.1 mm posterior right upper lobe pulmonary nodules. Recommend further evaluation with PET-CT for potential biopsy planning. If there is any clinical suspicion of pneumonia, a trial of antibiotic therapy preceding the PET-CT may be considered. Additional imaging evaluation or consultation with pulmonary medicine or thoracic surgery recommended. 2. The "S" modifier above refers to potentially clinically significant non lung cancer related findings. Specifically, left main and two-vessel coronary atherosclerosis . 3. Additional findings include aortic atherosclerosis and moderate emphysema.       Assessment & Plan:  Pulmonary nodules We will perform a PET scan to better evaluate the pulmonary nodules. Depending on this result we will either arrange for a high-resolution CT to facilitate navigational bronchoscopy or we will wait  for 3 months to repeat a CT to look for interval change. I will discuss this with him after the PET scan is done.  COPD (chronic obstructive pulmonary disease) (HCC) Has been using Anoro or breo or Incruse depending on samples from Dr gates. Currently on Anoro.   Tobacco use Discussed cessations. He is about to run out of chantix. We'll work on trying to help him find financial assistance for this.   Baltazar Apo, MD, PhD 06/08/2016, 9:28 AM  [facility] [phone] or if no answer [phone]

## 2016-06-14 ENCOUNTER — Ambulatory Visit (INDEPENDENT_AMBULATORY_CARE_PROVIDER_SITE_OTHER): Payer: Medicare Other | Admitting: Orthopaedic Surgery

## 2016-06-14 DIAGNOSIS — G8929 Other chronic pain: Secondary | ICD-10-CM | POA: Diagnosis not present

## 2016-06-14 DIAGNOSIS — M25562 Pain in left knee: Secondary | ICD-10-CM

## 2016-06-14 DIAGNOSIS — M1712 Unilateral primary osteoarthritis, left knee: Secondary | ICD-10-CM

## 2016-06-14 DIAGNOSIS — M7632 Iliotibial band syndrome, left leg: Secondary | ICD-10-CM | POA: Diagnosis not present

## 2016-06-14 MED ORDER — LIDOCAINE HCL 1 % IJ SOLN
3.0000 mL | INTRAMUSCULAR | Status: AC | PRN
  Administered 2016-06-14: 3 mL

## 2016-06-14 MED ORDER — METHYLPREDNISOLONE ACETATE 40 MG/ML IJ SUSP
40.0000 mg | INTRAMUSCULAR | Status: AC | PRN
  Administered 2016-06-14: 40 mg via INTRA_ARTICULAR

## 2016-06-14 MED ORDER — DICLOFENAC SODIUM 1 % TD GEL: 2.0000 g | Freq: Two times a day (BID) | TRANSDERMAL | Status: DC | PRN

## 2016-06-14 NOTE — Progress Notes (Signed)
Office Visit Note   Patient: [name] [name]           Date of Birth: 1941/06/26           MRN: [id number] Visit Date: 06/14/2016              Requested by: Josetta Huddle, MD [address] [place], [place] [address] PCP: Henrine Screws, MD   Assessment & Plan: Visit Diagnoses:  1. Chronic pain of left knee   2. Unilateral primary osteoarthritis, left knee     Plan: He is interested deathly and hyaluronic acid injection in his left knee. I gave him a handout on Monovisc we will make sure we ordered this for him. In the interim I did place an intra-articular steroid injection in his left knee as well as a steroid injection around the IT band and the lateral collateral ligament area. In 4 weeks when we see him back hopefully will have the monovisc injection approved for him  Follow-Up Instructions: Return in about 4 weeks (around 07/12/2016).   Orders:  No orders of the defined types were placed in this encounter.  Meds ordered this encounter  Medications  . diclofenac sodium (VOLTAREN) 1 % transdermal gel 2 g      Procedures: Large Joint Inj Date/Time: 06/14/2016 9:29 AM Performed by: Mcarthur Rossetti Authorized by: Mcarthur Rossetti   Location:  Knee Site:  L knee Ultrasound Guidance: No   Fluoroscopic Guidance: No   Arthrogram: No   Medications:  3 mL lidocaine 1 %; 40 mg methylPREDNISolone acetate 40 MG/ML     Clinical Data: No additional findings.   Subjective: Chief Complaint  Patient presents with  . Left Knee - Pain    Pain for several months in his left knee. Pain is bad when playing golf. 2-3 weeks ago went to [facility] and they told him he had arthritis and a meniscus tear.    HPI His main complaint is pain in his left knee when he swings and pulses in the golf club he is a right-handed golfer so he puts a lot of strain on the lateral aspect of his left knee when he swings. This been going on for a while. He has  had an injection around his knee but he feels ligaments is more intra-articular. He also has some wrist pain as well. He denies any locking catching in his knee and instability in the knee. He does wear a knee sleeve when he plays golf.   Review of Systems Negative for headache, shortness of breath, chest pain, fever, chills, nausea, vomiting  Objective: Vital Signs: There were no vitals taken for this visit.  Physical Exam He is alert and oriented 3 in no acute distress Ortho Exam Examination of his left knee shows no effusion. His knee is ligamentous is stable. There is some slight lateral joint line tenderness of the his pain seems to be more of the lateral collateral ligament and IT band. He deathly replicates this when he swings and is a popping sensation there as well. Specialty Comments:  No specialty comments available.  Imaging: No results found. He has some fluoroscopic images of his left knee that were taken at another office and you can see he has some lateral joint line narrowing of the left knee but no gross deformity. No significant malalignment of the left knee  PMFS History: Patient Active Problem List   Diagnosis Date Noted  . Pulmonary nodules 06/08/2016  .  COPD (chronic obstructive pulmonary disease) ([place]) 06/08/2016  . Tobacco use 06/08/2016   Past Medical History:  Diagnosis Date  . Alcohol abuse   . BPH with elevated PSA   . COPD (chronic obstructive pulmonary disease) ([place])   . Coronary artery disease   . ED (erectile dysfunction)   . Elevated PSA   . GERD (gastroesophageal reflux disease)   . Hyperlipidemia   . Hypertension   . Kidney cysts   . Palpitations     Family History  Problem Relation Age of Onset  . Hypertension Mother   . Alzheimer's disease Mother     Past Surgical History:  Procedure Laterality Date  . CARDIAC CATHETERIZATION  2002   50% RCA  . CATARACT EXTRACTION, BILATERAL    . KNEE ARTHROSCOPY    . MULTIPLE TOOTH  EXTRACTIONS    . TONSILLECTOMY     Social History   Occupational History  . Not on file.   Social History Main Topics  . Smoking status: Current Every Day Smoker    Packs/day: 0.25    Years: 40.00    Types: Cigarettes  . Smokeless tobacco: Never Used     Comment: Currently smokes 1/2 pack per day.  . Alcohol use No  . Drug use: No  . Sexual activity: Not on file

## 2016-06-18 ENCOUNTER — Encounter (HOSPITAL_COMMUNITY)
Admission: RE | Admit: 2016-06-18 | Discharge: 2016-06-18 | Disposition: A | Discharge status: Home / Self Care | Payer: Medicare Other | Source: Ambulatory Visit | Attending: Emergency Medicine | Admitting: Emergency Medicine

## 2016-06-18 ENCOUNTER — Telehealth (INDEPENDENT_AMBULATORY_CARE_PROVIDER_SITE_OTHER): Payer: Self-pay | Admitting: Orthopaedic Surgery

## 2016-06-18 DIAGNOSIS — R918 Other nonspecific abnormal finding of lung field: Secondary | ICD-10-CM | POA: Diagnosis present

## 2016-06-18 LAB — GLUCOSE, CAPILLARY: GLUCOSE-CAPILLARY: 107 mg/dL — AB (ref 65–99)

## 2016-06-18 MED ORDER — FLUDEOXYGLUCOSE F - 18 (FDG) INJECTION
9.0000 | Freq: Once | INTRAVENOUS | Status: AC | PRN
  Administered 2016-06-18: 9 via INTRAVENOUS

## 2016-06-18 MED ORDER — DICLOFENAC SODIUM 1 % TD GEL: 2.0000 g | Freq: Four times a day (QID) | TRANSDERMAL | 3 refills | Status: DC

## 2016-06-18 NOTE — Telephone Encounter (Signed)
as

## 2016-06-18 NOTE — Telephone Encounter (Signed)
Please advise 

## 2016-06-18 NOTE — Telephone Encounter (Signed)
Hopefully I just sent some in

## 2016-07-11 ENCOUNTER — Ambulatory Visit (INDEPENDENT_AMBULATORY_CARE_PROVIDER_SITE_OTHER): Payer: Medicare Other | Admitting: Orthopaedic Surgery

## 2016-07-12 ENCOUNTER — Ambulatory Visit (INDEPENDENT_AMBULATORY_CARE_PROVIDER_SITE_OTHER): Payer: Medicare Other | Admitting: Orthopaedic Surgery

## 2016-07-12 ENCOUNTER — Ambulatory Visit (INDEPENDENT_AMBULATORY_CARE_PROVIDER_SITE_OTHER): Payer: Medicare Other | Admitting: Emergency Medicine

## 2016-07-12 ENCOUNTER — Encounter: Payer: Self-pay | Admitting: Emergency Medicine

## 2016-07-12 DIAGNOSIS — J449 Chronic obstructive pulmonary disease, unspecified: Secondary | ICD-10-CM | POA: Diagnosis not present

## 2016-07-12 DIAGNOSIS — R918 Other nonspecific abnormal finding of lung field: Secondary | ICD-10-CM | POA: Diagnosis not present

## 2016-07-12 NOTE — Assessment & Plan Note (Signed)
He has been alternating long-acting bronchodilators to putting on which samples he can get. Currently on breo .

## 2016-07-12 NOTE — Patient Instructions (Signed)
We will perform a high resolution CT scan of your chest  We will arrange for a navigational bronchoscopy to be done after Christmas to evaluate your pulmonary nodules.  Continue your inhaled medication as you have been taking it. Follow with Dr Lamonte Sakai in 1 month

## 2016-07-12 NOTE — Assessment & Plan Note (Signed)
With hypermetabolism noted on PET scan. These remain concerning for possible malignancy. I believe the best modality is navigational bronchoscopy. We will perform a superD CT scan and plan to perform ENB after christmas to achieve tissue dx, BAL for cx data,

## 2016-07-12 NOTE — Progress Notes (Signed)
Subjective:    Patient ID: [name] [name], male    DOB: October 17, 1940, 75 y.o.   MRN: [id number]  HPI 75 yo man with hx tobacco use (40 pack years), hx CAD, HTN, hyperlipidemia, COPD diagnosed in . Currently managed w samples of either Anoro or Breo, Incruse. He has been Participating in the low-dose CT scan lung cancer screening program since October 2016. He had a repeat scan 06/02/16 that I personally reviewed. This scan identified new nodules compared with one year prior. There was an 11.7 mm left upper lobe pulmonary nodule, irregular 14.9 mm per segmental right lower lobe nodule and 13.1 mm posterior right upper lobe nodule with some central clearing. He is evaluated today for evaluation of the nodules. He tells me that he had a URI vs AE-COPD in September - this had cleared by the time the scan was done.   He has been trying to quit smoking - has been on chantix x 30 days.  No weight loss, decreased cough occas prod at night, no hemoptysis.   ROV 07/12/16 -- this follow-up visit for history of tobacco use and COPD as well as new pulmonary nodules identified on low-dose CT scan screening. His CT scan from 06/02/1710.7 mm left upper lobe nodule, irregular 14.9 mm segmental right lower lobe nodule. A stone this we arrange for a PET scan that was done on 06/18/16. He returns today to review the results. I have reviewed the scan myself. This shows hypermetabolism of the dominant right lower lobe nodule L as some hypermetabolism in the 2 Center meter posterior right upper lobe nodule, 0.7 cm right upper lobe nodule, 1.1 cm left upper lobe nodule. No evidence of distal disease. He is currently on Breo, rotates with Incruse and Anoro.    Review of Systems  Constitutional: Negative for fever and unexpected weight change.  HENT: Positive for dental problem. Negative for congestion, ear pain, nosebleeds, postnasal drip, rhinorrhea, sinus pressure, sneezing, sore throat and trouble swallowing.     Eyes: Negative for redness and itching.  Respiratory: Positive for cough and shortness of breath. Negative for chest tightness and wheezing.   Cardiovascular: Negative for palpitations and leg swelling.  Gastrointestinal: Negative for nausea and vomiting.  Genitourinary: Negative for dysuria.  Musculoskeletal: Negative for joint swelling.  Skin: Negative for rash.  Neurological: Negative for headaches.  Hematological: Does not bruise/bleed easily.  Psychiatric/Behavioral: Negative for dysphoric mood. The patient is not nervous/anxious.     Past Medical History:  Diagnosis Date  . Alcohol abuse   . BPH with elevated PSA   . COPD (chronic obstructive pulmonary disease) ([place])   . Coronary artery disease   . ED (erectile dysfunction)   . Elevated PSA   . GERD (gastroesophageal reflux disease)   . Hyperlipidemia   . Hypertension   . Kidney cysts   . Palpitations      Family History  Problem Relation Age of Onset  . Hypertension Mother   . Alzheimer's disease Mother      Social History   Social History  . Marital status: Married    Spouse name: N/A  . Number of children: N/A  . Years of education: N/A   Occupational History  . Not on file.   Social History Main Topics  . Smoking status: Current Every Day Smoker    Packs/day: 0.25    Years: 40.00    Types: Cigarettes  . Smokeless tobacco: Never Used     Comment: Currently smokes  1/2 pack per day.  . Alcohol use No  . Drug use: No  . Sexual activity: Not on file   Other Topics Concern  . Not on file   Social History Narrative  . 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  stays active, likes to golf   Allergies  Allergen Reactions  . Flagyl  [Metronidazole] Other (See Comments)     Outpatient Medications Prior to Visit  Medication Sig Dispense Refill  . esomeprazole (NEXIUM) 20 MG capsule Take 20 mg by mouth daily at 12 noon.    . umeclidinium-vilanterol (ANORO  ELLIPTA) 62.5-25 MCG/INH AEPB Inhale 1 puff into the lungs daily.    . diclofenac sodium (VOLTAREN) 1 % GEL Apply 2 g topically 4 (four) times daily. (Patient not taking: Reported on 07/12/2016) 100 g 3   Facility-Administered Medications Prior to Visit  Medication Dose Route Frequency Provider Last Rate Last Dose  . thallous chloride (201 THALLIUM) injection 4 milli Curie  4 millicurie Intravenous Once PRN Larey Dresser, MD            Objective:   Physical Exam Vitals:   07/12/16 1513  BP: 114/66  Pulse: 77  SpO2: 98%  Weight: 161 lb (73 kg)  Height: 5' 10.5" (1.791 m)   Gen: Pleasant, well-nourished, in no distress,  normal affect  ENT: No lesions,  mouth clear,  oropharynx clear, no postnasal drip  Neck: No JVD, no TMG, no carotid bruits  Lungs: No use of accessory muscles, no dullness to percussion, clear without rales or rhonchi  Cardiovascular: RRR, heart sounds normal, no murmur or gallops, no peripheral edema  Musculoskeletal: No deformities, no cyanosis or clubbing  Neuro: alert, non focal  Skin: Warm, no lesions or rashes    PET 06/18/16 --  COMPARISON:  06/01/2016 screening chest CT.  FINDINGS: NECK  No hypermetabolic lymph nodes in the neck.  CHEST  Left main, left anterior descending, left circumflex and right coronary atherosclerosis. Atherosclerotic nonaneurysmal thoracic aorta. No hypermetabolic axillary, mediastinal or hilar lymph nodes. No pleural effusions. Moderate centrilobular emphysema and mild diffuse bronchial wall thickening.  There is a dominant hypermetabolic irregular 2.5 x 2.4 cm superior segment right lower lobe pulmonary nodule abutting and distorting the posterior pleura with max SUV 3.6 (series 8/image 46), not appreciably changed in size from 06/01/2016 chest CT.  There is an irregular hypermetabolic 2.0 x 1.9 cm posterior right upper lobe pulmonary nodule abutting the right major fissure with max SUV 3.1 (series  8/image 42), not appreciably changed in size from 06/01/2016 chest CT.  There is an irregular 0.7 cm right upper lobe pulmonary nodule with low level metabolism with max SUV 1.7 (series 8/ image 35), considered significant for a nodule of this small size, which is unchanged in size from 06/01/2016 chest CT.  There is a solid hypermetabolic 1.1 x 1.0 cm left upper lobe pulmonary nodule with max SUV 3.2 (series 8/image 32), not appreciably changed in size from 06/01/2016 chest CT.  There are at least 4 additional scattered pulmonary nodules in both lungs, largest 5 mm in the right upper lobe (series 8/image 32), below PET resolution.  ABDOMEN/PELVIS  No abnormal hypermetabolic activity within the liver, pancreas, adrenal glands, or spleen. No hypermetabolic lymph nodes in the abdomen or pelvis. Small segmental focus of hypermetabolism in the cecum without definite wall thickening or other correlate on the CT images, favor physiologic activity. Exophytic simple  5.5 cm renal cyst in the lateral lower right kidney. Mildly enlarged prostate with nonspecific internal prostatic calcifications. Atherosclerotic nonaneurysmal abdominal aorta.  SKELETON  No focal hypermetabolic activity to suggest skeletal metastasis.  IMPRESSION: 1. Dominant hypermetabolic (max SUV 3.6) irregular 2.5 cm superior segment right lower lobe pulmonary nodule abutting and distorting the posterior pleura, suspicious for primary bronchogenic carcinoma. This nodule probably represents the best initial target for tissue sampling. 2. Three additional smaller hypermetabolic pulmonary nodules, measuring 2.0 cm in the posterior right upper lobe, 0.7 cm in the right upper lobe and 1.1 cm in the left upper lobe. These could represent synchronous primary bronchogenic carcinomas and/or pulmonary metastases. 3. No hypermetabolic thoracic lymphadenopathy or distant metastatic disease. No pleural effusions. 4.  Aortic atherosclerosis. Left main and 3 vessel coronary atherosclerosis. 5. Moderate centrilobular emphysema and mild diffuse bronchial wall thickening, suggesting COPD       Assessment & Plan:  Pulmonary nodules With hypermetabolism noted on PET scan. These remain concerning for possible malignancy. I believe the best modality is navigational bronchoscopy. We will perform a superD CT scan and plan to perform ENB after christmas to achieve tissue dx, BAL for cx data,   COPD (chronic obstructive pulmonary disease) ([place]) He has been alternating long-acting bronchodilators to putting on which samples he can get. Currently on breo .   Baltazar Apo, MD, PhD 07/12/2016, 3:50 PM [facility] [phone] or if no answer [phone]

## 2016-07-17 ENCOUNTER — Ambulatory Visit (INDEPENDENT_AMBULATORY_CARE_PROVIDER_SITE_OTHER)
Admission: RE | Admit: 2016-07-17 | Discharge: 2016-07-17 | Disposition: A | Discharge status: Home / Self Care | Payer: Medicare Other | Source: Ambulatory Visit | Attending: Emergency Medicine | Admitting: Emergency Medicine

## 2016-07-17 ENCOUNTER — Telehealth: Payer: Self-pay | Admitting: Emergency Medicine

## 2016-07-17 ENCOUNTER — Ambulatory Visit (INDEPENDENT_AMBULATORY_CARE_PROVIDER_SITE_OTHER): Payer: Medicare Other | Admitting: Orthopaedic Surgery

## 2016-07-17 DIAGNOSIS — R918 Other nonspecific abnormal finding of lung field: Secondary | ICD-10-CM

## 2016-07-17 NOTE — Telephone Encounter (Signed)
Return call from [facility]

## 2016-07-17 NOTE — Telephone Encounter (Signed)
[name] [name] from pre-admission returned phone call.[name] [name]

## 2016-07-17 NOTE — Telephone Encounter (Signed)
lmtcb for pt.  

## 2016-07-17 NOTE — Telephone Encounter (Signed)
Called and lmomtcb x 1 for Jan.

## 2016-07-18 NOTE — Telephone Encounter (Signed)
done

## 2016-07-18 NOTE — Telephone Encounter (Signed)
Spoke with pre admissions. They are needing orders placed by RB. Will route message to him to make him aware.

## 2016-07-18 NOTE — Pre-Procedure Instructions (Signed)
    [name] [name].  07/18/2016      [id number] [address], [address] [place] [address] Phone: [phone] Fax: [phone]    Your procedure is scheduled on Thursday, December 28th   Report to [facility] Admitting at 5:30 AM   Call this number if you have problems the [facility] of surgery:  [phone], or [phone] Monday - Friday 8 - 4:30 pm   Remember:  Do not eat food or drink liquids after midnight Wednesday.   Take these medicines the morning of surgery with A SIP OF WATER : Nexium             4-5 days prior to surgery, STOP TAKING any Vitamins, Herbal supplements, Anti-inflammatories.   Do not wear jewelry - no rings or watches.  Do not wear lotions, colognes or deoderant.              Men may shave face and neck.  Do not bring valuables to the hospital.  [facility] is not responsible for any belongings or valuables.  Contacts, dentures or bridgework may not be worn into surgery.  Leave your suitcase in the car.  After surgery it may be brought to your room. For patients admitted to the hospital, discharge time will be determined by your treatment team.  Patients discharged the day of surgery will not be allowed to drive home.   Please read over the following fact sheets that you were given. Pain Booklet and Surgical Site Infection Prevention

## 2016-07-19 ENCOUNTER — Encounter (HOSPITAL_COMMUNITY): Payer: Self-pay

## 2016-07-19 ENCOUNTER — Telehealth: Payer: Self-pay | Admitting: Emergency Medicine

## 2016-07-19 ENCOUNTER — Encounter (HOSPITAL_COMMUNITY)
Admission: RE | Admit: 2016-07-19 | Discharge: 2016-07-19 | Disposition: A | Discharge status: Home / Self Care | Payer: Medicare Other | Source: Ambulatory Visit | Attending: Emergency Medicine | Admitting: Emergency Medicine

## 2016-07-19 DIAGNOSIS — R911 Solitary pulmonary nodule: Secondary | ICD-10-CM | POA: Diagnosis not present

## 2016-07-19 DIAGNOSIS — I491 Atrial premature depolarization: Secondary | ICD-10-CM | POA: Diagnosis not present

## 2016-07-19 DIAGNOSIS — Z01810 Encounter for preprocedural cardiovascular examination: Secondary | ICD-10-CM | POA: Diagnosis present

## 2016-07-19 DIAGNOSIS — Z01812 Encounter for preprocedural laboratory examination: Secondary | ICD-10-CM | POA: Insufficient documentation

## 2016-07-19 HISTORY — DX: Dyspnea, unspecified: R06.00

## 2016-07-19 HISTORY — DX: Unspecified osteoarthritis, unspecified site: M19.90

## 2016-07-19 LAB — COMPREHENSIVE METABOLIC PANEL
ALT: 16 U/L — AB (ref 17–63)
AST: 18 U/L (ref 15–41)
Albumin: 4.2 g/dL (ref 3.5–5.0)
Alkaline Phosphatase: 78 U/L (ref 38–126)
Anion gap: 10 (ref 5–15)
BUN: 13 mg/dL (ref 6–20)
CHLORIDE: 101 mmol/L (ref 101–111)
CO2: 26 mmol/L (ref 22–32)
CREATININE: 1.11 mg/dL (ref 0.61–1.24)
Calcium: 9.3 mg/dL (ref 8.9–10.3)
GFR calc Af Amer: >60 mL/min (ref >60)
GFR calc non Af Amer: >60 mL/min (ref >60)
Glucose, Bld: 102 mg/dL — ABNORMAL HIGH (ref 65–99)
Potassium: 4.3 mmol/L (ref 3.5–5.1)
SODIUM: 137 mmol/L (ref 135–145)
Total Bilirubin: 0.8 mg/dL (ref 0.3–1.2)
Total Protein: 7.5 g/dL (ref 6.5–8.1)

## 2016-07-19 LAB — CBC
HCT: 47 % (ref 39.0–52.0)
Hemoglobin: 16 g/dL (ref 13.0–17.0)
MCH: 31.8 pg (ref 26.0–34.0)
MCHC: 34 g/dL (ref 30.0–36.0)
MCV: 93.4 fL (ref 78.0–100.0)
PLATELETS: 197 10*3/uL (ref 150–400)
RBC: 5.03 MIL/uL (ref 4.22–5.81)
RDW: 14 % (ref 11.5–15.5)
WBC: 13.6 10*3/uL — AB (ref 4.0–10.5)

## 2016-07-19 LAB — APTT: aPTT: 40 seconds — ABNORMAL HIGH (ref 24–36)

## 2016-07-19 LAB — PROTIME-INR
INR: 1.01
Prothrombin Time: 13.3 seconds (ref 11.4–15.2)

## 2016-07-19 NOTE — Progress Notes (Signed)
PCP is Dr. America Brown Did have heart cath back in 2002.  Not real sure why he had one.  Had seen Dr. Johnsie Cancel back in 2016 for stress test.  Has had no symptoms, no CP, N/V, diaphoresis.  And he has not been back since.

## 2016-07-20 ENCOUNTER — Telehealth: Payer: Self-pay | Admitting: Emergency Medicine

## 2016-07-20 NOTE — Telephone Encounter (Signed)
Spoke with patient regarding his medication and surgery. He was at the pharmacy picking up a prednisone RX and wanted to know if he fit in the categories of meds he should not take before his procedure next week. I explained that Prednisone did not fall into that category and he should be ok.

## 2016-07-20 NOTE — Telephone Encounter (Signed)
Nothing noted in the phone note.  Will close. error

## 2016-07-20 NOTE — Progress Notes (Signed)
Anesthesia chart review: Patient is a 75 year old male scheduled for navigational bronchoscopy with biopsies on 07/26/2016 by Dr. Lamonte Sakai. Diagnosis: pulmonary nodules (noted on repeat lung cancer screening low dose CT). PET showed some hypermetabolism in the RLL dominant nodule and also small RUL/LUL nodules.   History includes smoking, CAD (50% RCA '02), hypertension, hyperlipidemia, COPD, GERD, BPH, ED, palpitations, renal cyst, arthritis, dyspnea, ETOH abuse (sober since 2014), tonsillectomy, left knee arthroscopy '11.  PCP is Dr. America Brown. Cardiologist is Dr. Johnsie Cancel, last visit 04/25/15 after long hiatus from seeing a cardiologist. A stress test was ordered that was low risk.   Meds include vitamin D, Nexium, Anoro Ellipta.  BP (!) 161/83   Pulse 75   Temp 36.6 C   Resp 20   Ht 5' 10.5" (1.791 m)   Wt 158 lb 4.8 oz (71.8 kg)   SpO2 99%   BMI 22.39 kg/m   EKG 07/19/16: SR with PACs.  Nuclear stress test 04/1715:  The left ventricular ejection fraction is normal (55-65%).  Nuclear stress EF: 55%.  There was no ST segment deviation noted during stress.  Defect 1: There is a medium sized, fixed defect of mild severity present in the basal inferior, mid inferior and apical inferior location. This is consistent with diaphragmatic attenuation. No ischemia noted.  This is a low risk study.  Cardiac cath 06/02/01 (Dr. Fabio Asa): Left main without significant disease. LAD gives rise to a small diagonal 1, large bifurcating diagonal 2, and goes on to end as an apical branch. 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 Circumflex gives rise to a large bifurcating OM1 and and as an AV groove vessel. There is no significant disease in the circumflex or its branches. The RCA is a large dominant artery giving rise to several RV marginals, PDA and PL branch. There is a long proximal lesion up to 30 mm noted in the RCA of 50% stenosis. Recommendations: Continue medical therapy.  If he develops significant ongoing symptoms could consider stress Cardiolite and if the territory of the RCA is noted to have problems would consider intervention.  CT Super D Chest w/o contrast 07/17/16: IMPRESSION: 1. Bilateral pulmonary nodules are again identified as described above. 2. Diffuse bronchial wall thickening with emphysema, as above; imaging findings suggestive of underlying COPD. 3. Aortic atherosclerosis and coronary artery calcifications.  Preoperative labs noted. WBC 13.6, H/H 16.0/47.0, PLT 197. Cr 1.11. PT/INR WNL. PTT 40. Glucose 102. AST 18, ALT 16. PTT not elevated in 2011. Will recheck day of surgery to confirm.  Patient had a non-ischemic stress test just over a year ago. He denied CP, N/V, diaphoresis at PAT. If labs felt acceptable and otherwise no acute changes then I would anticipate that he could proceed as planned.  George Hugh [facility] Short Stay Center/Anesthesiology Phone [phone] 07/20/2016 2:07 PM

## 2016-07-20 NOTE — Telephone Encounter (Signed)
Pt cab [place] be reached @ [phone], he is presently on his way hm cn reach him better on cell.[name] [name]

## 2016-07-26 ENCOUNTER — Ambulatory Visit (HOSPITAL_COMMUNITY): Payer: Medicare Other | Admitting: Vascular Surgery

## 2016-07-26 ENCOUNTER — Encounter (HOSPITAL_COMMUNITY)
Admission: RE | Disposition: A | Discharge status: Home / Self Care | Payer: Self-pay | Source: Ambulatory Visit | Attending: Emergency Medicine

## 2016-07-26 ENCOUNTER — Ambulatory Visit (HOSPITAL_COMMUNITY): Payer: Medicare Other

## 2016-07-26 ENCOUNTER — Ambulatory Visit (HOSPITAL_COMMUNITY)
Admission: RE | Admit: 2016-07-26 | Discharge: 2016-07-26 | Disposition: A | Discharge status: Home / Self Care | Payer: Medicare Other | Source: Ambulatory Visit | Attending: Emergency Medicine | Admitting: Emergency Medicine

## 2016-07-26 ENCOUNTER — Encounter (HOSPITAL_COMMUNITY): Payer: Self-pay | Admitting: *Deleted

## 2016-07-26 DIAGNOSIS — I2510 Atherosclerotic heart disease of native coronary artery without angina pectoris: Secondary | ICD-10-CM | POA: Insufficient documentation

## 2016-07-26 DIAGNOSIS — Z419 Encounter for procedure for purposes other than remedying health state, unspecified: Secondary | ICD-10-CM

## 2016-07-26 DIAGNOSIS — Z79899 Other long term (current) drug therapy: Secondary | ICD-10-CM | POA: Insufficient documentation

## 2016-07-26 DIAGNOSIS — R918 Other nonspecific abnormal finding of lung field: Secondary | ICD-10-CM | POA: Diagnosis present

## 2016-07-26 DIAGNOSIS — K219 Gastro-esophageal reflux disease without esophagitis: Secondary | ICD-10-CM | POA: Insufficient documentation

## 2016-07-26 DIAGNOSIS — J60 Coalworker's pneumoconiosis: Secondary | ICD-10-CM | POA: Diagnosis not present

## 2016-07-26 DIAGNOSIS — F17210 Nicotine dependence, cigarettes, uncomplicated: Secondary | ICD-10-CM | POA: Insufficient documentation

## 2016-07-26 DIAGNOSIS — J449 Chronic obstructive pulmonary disease, unspecified: Secondary | ICD-10-CM | POA: Insufficient documentation

## 2016-07-26 DIAGNOSIS — Z7951 Long term (current) use of inhaled steroids: Secondary | ICD-10-CM | POA: Insufficient documentation

## 2016-07-26 DIAGNOSIS — Z9889 Other specified postprocedural states: Secondary | ICD-10-CM

## 2016-07-26 HISTORY — PX: VIDEO BRONCHOSCOPY WITH ENDOBRONCHIAL NAVIGATION: SHX6175

## 2016-07-26 LAB — APTT: APTT: 35 s (ref 24–36)

## 2016-07-26 SURGERY — VIDEO BRONCHOSCOPY WITH ENDOBRONCHIAL NAVIGATION
Anesthesia: General

## 2016-07-26 MED ORDER — PROMETHAZINE HCL 25 MG/ML IJ SOLN: 6.2500 mg | INTRAMUSCULAR | Status: DC | PRN

## 2016-07-26 MED ORDER — ALBUTEROL SULFATE HFA 108 (90 BASE) MCG/ACT IN AERS
INHALATION_SPRAY | RESPIRATORY_TRACT | Status: AC
  Filled 2016-07-26: qty 6.7

## 2016-07-26 MED ORDER — ONDANSETRON HCL 4 MG/2ML IJ SOLN
INTRAMUSCULAR | Status: DC | PRN
  Administered 2016-07-26: 4 mg via INTRAVENOUS

## 2016-07-26 MED ORDER — FENTANYL CITRATE (PF) 100 MCG/2ML IJ SOLN
INTRAMUSCULAR | Status: DC | PRN
  Administered 2016-07-26: 50 ug via INTRAVENOUS

## 2016-07-26 MED ORDER — SUGAMMADEX SODIUM 200 MG/2ML IV SOLN
INTRAVENOUS | Status: AC
  Filled 2016-07-26: qty 2

## 2016-07-26 MED ORDER — SUGAMMADEX SODIUM 200 MG/2ML IV SOLN
INTRAVENOUS | Status: DC | PRN
  Administered 2016-07-26: 150 mg via INTRAVENOUS

## 2016-07-26 MED ORDER — LIDOCAINE 2% (20 MG/ML) 5 ML SYRINGE
INTRAMUSCULAR | Status: DC | PRN
  Administered 2016-07-26 (×2): 60 mg via INTRAVENOUS

## 2016-07-26 MED ORDER — PROPOFOL 10 MG/ML IV BOLUS
INTRAVENOUS | Status: AC
  Filled 2016-07-26: qty 20

## 2016-07-26 MED ORDER — PHENYLEPHRINE HCL 10 MG/ML IJ SOLN
INTRAMUSCULAR | Status: DC | PRN
  Administered 2016-07-26: 40 ug via INTRAVENOUS
  Administered 2016-07-26: 120 ug via INTRAVENOUS
  Administered 2016-07-26 (×3): 80 ug via INTRAVENOUS
  Administered 2016-07-26: 40 ug via INTRAVENOUS
  Administered 2016-07-26: 80 ug via INTRAVENOUS
  Administered 2016-07-26: 40 ug via INTRAVENOUS

## 2016-07-26 MED ORDER — ARTIFICIAL TEARS OP OINT
TOPICAL_OINTMENT | OPHTHALMIC | Status: AC
  Filled 2016-07-26: qty 3.5

## 2016-07-26 MED ORDER — LIDOCAINE 2% (20 MG/ML) 5 ML SYRINGE
INTRAMUSCULAR | Status: AC
  Filled 2016-07-26: qty 10

## 2016-07-26 MED ORDER — ALBUTEROL SULFATE HFA 108 (90 BASE) MCG/ACT IN AERS
INHALATION_SPRAY | RESPIRATORY_TRACT | Status: DC | PRN
  Administered 2016-07-26: 8 via RESPIRATORY_TRACT

## 2016-07-26 MED ORDER — PROPOFOL 10 MG/ML IV BOLUS
INTRAVENOUS | Status: DC | PRN
  Administered 2016-07-26: 150 mg via INTRAVENOUS

## 2016-07-26 MED ORDER — LACTATED RINGERS IV SOLN
INTRAVENOUS | Status: DC | PRN
  Administered 2016-07-26 (×2): via INTRAVENOUS

## 2016-07-26 MED ORDER — ONDANSETRON HCL 4 MG/2ML IJ SOLN
INTRAMUSCULAR | Status: AC
  Filled 2016-07-26: qty 2

## 2016-07-26 MED ORDER — PHENYLEPHRINE 40 MCG/ML (10ML) SYRINGE FOR IV PUSH (FOR BLOOD PRESSURE SUPPORT)
PREFILLED_SYRINGE | INTRAVENOUS | Status: AC
  Filled 2016-07-26: qty 10

## 2016-07-26 MED ORDER — FENTANYL CITRATE (PF) 100 MCG/2ML IJ SOLN
INTRAMUSCULAR | Status: AC
  Filled 2016-07-26: qty 2

## 2016-07-26 MED ORDER — FENTANYL CITRATE (PF) 100 MCG/2ML IJ SOLN: 25.0000 ug | INTRAMUSCULAR | Status: DC | PRN

## 2016-07-26 MED ORDER — ROCURONIUM BROMIDE 10 MG/ML (PF) SYRINGE
PREFILLED_SYRINGE | INTRAVENOUS | Status: DC | PRN
  Administered 2016-07-26: 50 mg via INTRAVENOUS

## 2016-07-26 MED ORDER — 0.9 % SODIUM CHLORIDE (POUR BTL) OPTIME
TOPICAL | Status: DC | PRN
  Administered 2016-07-26: 1000 mL

## 2016-07-26 SURGICAL SUPPLY — 39 items
ADAPTER BRONCH F/PENTAX (ADAPTER) ×2 IMPLANT
ADPR BSCP EDG PNTX (ADAPTER) ×1
BRUSH BIOPSY BRONCH 10 SDTNB (MISCELLANEOUS) ×2 IMPLANT
BRUSH CYTOL CELLEBRITY 1.5X140 (MISCELLANEOUS) ×2 IMPLANT
BRUSH SUPERTRAX BIOPSY (INSTRUMENTS) IMPLANT
BRUSH SUPERTRAX NDL-TIP CYTO (INSTRUMENTS) ×4 IMPLANT
CANISTER SUCTION 2500CC (MISCELLANEOUS) ×2 IMPLANT
CHANNEL WORK EXTEND EDGE 180 (KITS) IMPLANT
CHANNEL WORK EXTEND EDGE 45 (KITS) IMPLANT
CHANNEL WORK EXTEND EDGE 90 (KITS) IMPLANT
CONT SPEC 4OZ CLIKSEAL STRL BL (MISCELLANEOUS) ×4 IMPLANT
COVER TABLE BACK 60X90 (DRAPES) ×2 IMPLANT
COVIDIEN SUPERDIMENSION CYTOLOGY BRUSH ×2 IMPLANT
FILTER STRAW FLUID ASPIR (MISCELLANEOUS) IMPLANT
FORCEPS BIOP SUPERTRX PREMAR (INSTRUMENTS) ×2 IMPLANT
GAUZE SPONGE 4X4 12PLY STRL (GAUZE/BANDAGES/DRESSINGS) ×2 IMPLANT
GLOVE BIO SURGEON STRL SZ7.5 (GLOVE) ×4 IMPLANT
GOWN STRL REUS W/ TWL LRG LVL3 (GOWN DISPOSABLE) ×1 IMPLANT
GOWN STRL REUS W/TWL LRG LVL3 (GOWN DISPOSABLE) ×1
KIT CLEAN ENDO COMPLIANCE (KITS) ×2 IMPLANT
KIT LOCATABLE GUIDE (CANNULA) IMPLANT
KIT MARKER FIDUCIAL DELIVERY (KITS) IMPLANT
KIT PROCEDURE EDGE 180 (KITS) ×2 IMPLANT
KIT PROCEDURE EDGE 45 (KITS) IMPLANT
KIT PROCEDURE EDGE 90 (KITS) IMPLANT
KIT ROOM TURNOVER OR (KITS) ×2 IMPLANT
MARKER SKIN DUAL TIP RULER LAB (MISCELLANEOUS) ×2 IMPLANT
NEEDLE SUPERTRX PREMARK BIOPSY (NEEDLE) ×2 IMPLANT
NS IRRIG 1000ML POUR BTL (IV SOLUTION) ×2 IMPLANT
OIL SILICONE PENTAX (PARTS (SERVICE/REPAIRS)) ×2 IMPLANT
PAD ARMBOARD 7.5X6 YLW CONV (MISCELLANEOUS) ×4 IMPLANT
PATCHES PATIENT (LABEL) ×2 IMPLANT
SYR 20CC LL (SYRINGE) ×2 IMPLANT
SYR 20ML ECCENTRIC (SYRINGE) ×2 IMPLANT
SYR 50ML SLIP (SYRINGE) ×2 IMPLANT
TOWEL OR 17X24 6PK STRL BLUE (TOWEL DISPOSABLE) ×2 IMPLANT
TRAP SPECIMEN MUCOUS 40CC (MISCELLANEOUS) IMPLANT
TUBE CONNECTING 20X1/4 (TUBING) ×2 IMPLANT
WATER STERILE IRR 1000ML POUR (IV SOLUTION) ×2 IMPLANT

## 2016-07-26 NOTE — Anesthesia Postprocedure Evaluation (Signed)
Anesthesia Post Note  Patient: [name] [name].  Procedure(s) Performed: Procedure(s) (LRB): VIDEO BRONCHOSCOPY WITH ENDOBRONCHIAL NAVIGATION (N/A)  Patient location during evaluation: PACU Anesthesia Type: General Level of consciousness: awake and alert Pain management: pain level controlled Vital Signs Assessment: post-procedure vital signs reviewed and stable Respiratory status: spontaneous breathing, nonlabored ventilation, respiratory function stable and patient connected to nasal cannula oxygen Cardiovascular status: blood pressure returned to baseline and stable Postop Assessment: no signs of nausea or vomiting Anesthetic complications: no       Last Vitals:  Vitals:   07/26/16 0547 07/26/16 0905  BP: (!) 142/68   Pulse: 81   Resp: 20   Temp: 36.5 C 36.3 C    Last Pain:  Vitals:   07/26/16 0547  TempSrc: Oral                 Marlen Mollica S

## 2016-07-26 NOTE — H&P (View-Only) (Signed)
Subjective:    Patient ID: [name] [name], male    DOB: 07-14-1941, 75 y.o.   MRN: [id number]  HPI 75 yo man with hx tobacco use (40 pack years), hx CAD, HTN, hyperlipidemia, COPD diagnosed in . Currently managed w samples of either Anoro or Breo, Incruse. He has been Participating in the low-dose CT scan lung cancer screening program since October 2016. He had a repeat scan 06/02/16 that I personally reviewed. This scan identified new nodules compared with one year prior. There was an 11.7 mm left upper lobe pulmonary nodule, irregular 14.9 mm per segmental right lower lobe nodule and 13.1 mm posterior right upper lobe nodule with some central clearing. He is evaluated today for evaluation of the nodules. He tells me that he had a URI vs AE-COPD in September - this had cleared by the time the scan was done.   He has been trying to quit smoking - has been on chantix x 30 days.  No weight loss, decreased cough occas prod at night, no hemoptysis.   ROV 07/12/16 -- this follow-up visit for history of tobacco use and COPD as well as new pulmonary nodules identified on low-dose CT scan screening. His CT scan from 06/02/1710.7 mm left upper lobe nodule, irregular 14.9 mm segmental right lower lobe nodule. A stone this we arrange for a PET scan that was done on 06/18/16. He returns today to review the results. I have reviewed the scan myself. This shows hypermetabolism of the dominant right lower lobe nodule L as some hypermetabolism in the 2 Center meter posterior right upper lobe nodule, 0.7 cm right upper lobe nodule, 1.1 cm left upper lobe nodule. No evidence of distal disease. He is currently on Breo, rotates with Incruse and Anoro.    Review of Systems  Constitutional: Negative for fever and unexpected weight change.  HENT: Positive for dental problem. Negative for congestion, ear pain, nosebleeds, postnasal drip, rhinorrhea, sinus pressure, sneezing, sore throat and trouble swallowing.     Eyes: Negative for redness and itching.  Respiratory: Positive for cough and shortness of breath. Negative for chest tightness and wheezing.   Cardiovascular: Negative for palpitations and leg swelling.  Gastrointestinal: Negative for nausea and vomiting.  Genitourinary: Negative for dysuria.  Musculoskeletal: Negative for joint swelling.  Skin: Negative for rash.  Neurological: Negative for headaches.  Hematological: Does not bruise/bleed easily.  Psychiatric/Behavioral: Negative for dysphoric mood. The patient is not nervous/anxious.     Past Medical History:  Diagnosis Date  . Alcohol abuse   . BPH with elevated PSA   . COPD (chronic obstructive pulmonary disease) ([place])   . Coronary artery disease   . ED (erectile dysfunction)   . Elevated PSA   . GERD (gastroesophageal reflux disease)   . Hyperlipidemia   . Hypertension   . Kidney cysts   . Palpitations      Family History  Problem Relation Age of Onset  . Hypertension Mother   . Alzheimer's disease Mother      Social History   Social History  . Marital status: Married    Spouse name: N/A  . Number of children: N/A  . Years of education: N/A   Occupational History  . Not on file.   Social History Main Topics  . Smoking status: Current Every Day Smoker    Packs/day: 0.25    Years: 40.00    Types: Cigarettes  . Smokeless tobacco: Never Used     Comment: Currently smokes  1/2 pack per day.  . Alcohol use No  . Drug use: No  . Sexual activity: Not on file   Other Topics Concern  . Not on file   Social History Narrative  . 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  stays active, likes to golf   Allergies  Allergen Reactions  . Flagyl  [Metronidazole] Other (See Comments)     Outpatient Medications Prior to Visit  Medication Sig Dispense Refill  . esomeprazole (NEXIUM) 20 MG capsule Take 20 mg by mouth daily at 12 noon.    . umeclidinium-vilanterol (ANORO  ELLIPTA) 62.5-25 MCG/INH AEPB Inhale 1 puff into the lungs daily.    . diclofenac sodium (VOLTAREN) 1 % GEL Apply 2 g topically 4 (four) times daily. (Patient not taking: Reported on 07/12/2016) 100 g 3   Facility-Administered Medications Prior to Visit  Medication Dose Route Frequency Provider Last Rate Last Dose  . thallous chloride (201 THALLIUM) injection 4 milli Curie  4 millicurie Intravenous Once PRN Larey Dresser, MD            Objective:   Physical Exam Vitals:   07/12/16 1513  BP: 114/66  Pulse: 77  SpO2: 98%  Weight: 161 lb (73 kg)  Height: 5' 10.5" (1.791 m)   Gen: Pleasant, well-nourished, in no distress,  normal affect  ENT: No lesions,  mouth clear,  oropharynx clear, no postnasal drip  Neck: No JVD, no TMG, no carotid bruits  Lungs: No use of accessory muscles, no dullness to percussion, clear without rales or rhonchi  Cardiovascular: RRR, heart sounds normal, no murmur or gallops, no peripheral edema  Musculoskeletal: No deformities, no cyanosis or clubbing  Neuro: alert, non focal  Skin: Warm, no lesions or rashes    PET 06/18/16 --  COMPARISON:  06/01/2016 screening chest CT.  FINDINGS: NECK  No hypermetabolic lymph nodes in the neck.  CHEST  Left main, left anterior descending, left circumflex and right coronary atherosclerosis. Atherosclerotic nonaneurysmal thoracic aorta. No hypermetabolic axillary, mediastinal or hilar lymph nodes. No pleural effusions. Moderate centrilobular emphysema and mild diffuse bronchial wall thickening.  There is a dominant hypermetabolic irregular 2.5 x 2.4 cm superior segment right lower lobe pulmonary nodule abutting and distorting the posterior pleura with max SUV 3.6 (series 8/image 46), not appreciably changed in size from 06/01/2016 chest CT.  There is an irregular hypermetabolic 2.0 x 1.9 cm posterior right upper lobe pulmonary nodule abutting the right major fissure with max SUV 3.1 (series  8/image 42), not appreciably changed in size from 06/01/2016 chest CT.  There is an irregular 0.7 cm right upper lobe pulmonary nodule with low level metabolism with max SUV 1.7 (series 8/ image 35), considered significant for a nodule of this small size, which is unchanged in size from 06/01/2016 chest CT.  There is a solid hypermetabolic 1.1 x 1.0 cm left upper lobe pulmonary nodule with max SUV 3.2 (series 8/image 32), not appreciably changed in size from 06/01/2016 chest CT.  There are at least 4 additional scattered pulmonary nodules in both lungs, largest 5 mm in the right upper lobe (series 8/image 32), below PET resolution.  ABDOMEN/PELVIS  No abnormal hypermetabolic activity within the liver, pancreas, adrenal glands, or spleen. No hypermetabolic lymph nodes in the abdomen or pelvis. Small segmental focus of hypermetabolism in the cecum without definite wall thickening or other correlate on the CT images, favor physiologic activity. Exophytic simple  5.5 cm renal cyst in the lateral lower right kidney. Mildly enlarged prostate with nonspecific internal prostatic calcifications. Atherosclerotic nonaneurysmal abdominal aorta.  SKELETON  No focal hypermetabolic activity to suggest skeletal metastasis.  IMPRESSION: 1. Dominant hypermetabolic (max SUV 3.6) irregular 2.5 cm superior segment right lower lobe pulmonary nodule abutting and distorting the posterior pleura, suspicious for primary bronchogenic carcinoma. This nodule probably represents the best initial target for tissue sampling. 2. Three additional smaller hypermetabolic pulmonary nodules, measuring 2.0 cm in the posterior right upper lobe, 0.7 cm in the right upper lobe and 1.1 cm in the left upper lobe. These could represent synchronous primary bronchogenic carcinomas and/or pulmonary metastases. 3. No hypermetabolic thoracic lymphadenopathy or distant metastatic disease. No pleural effusions. 4.  Aortic atherosclerosis. Left main and 3 vessel coronary atherosclerosis. 5. Moderate centrilobular emphysema and mild diffuse bronchial wall thickening, suggesting COPD       Assessment & Plan:  Pulmonary nodules With hypermetabolism noted on PET scan. These remain concerning for possible malignancy. I believe the best modality is navigational bronchoscopy. We will perform a superD CT scan and plan to perform ENB after christmas to achieve tissue dx, BAL for cx data,   COPD (chronic obstructive pulmonary disease) ([place]) He has been alternating long-acting bronchodilators to putting on which samples he can get. Currently on breo .   Baltazar Apo, MD, PhD 07/12/2016, 3:50 PM [facility] [phone] or if no answer [phone]

## 2016-07-26 NOTE — Interval H&P Note (Signed)
PCCM Interval Note  [name] [name] presents for further f/u of his B pulm nodules. Since I last saw him mid-Dec he has been treated for an AE of his COPD with prednisone taper. He had dyspnea, cough, wheeze, mucous. All of these have improved, taper almost completed. No hemoptysis reported.   Vitals:   07/26/16 0547 07/26/16 0558  BP: (!) 142/68   Pulse: 81   Resp: 20   Temp: 97.7 F (36.5 C)   TempSrc: Oral   SpO2: 100%   Weight: 71.7 kg (158 lb)   Height:  5' 10.5" (1.791 m)   Gen: Pleasant, well-nourished, in no distress,  normal affect  ENT: No lesions,  mouth clear,  oropharynx clear, no postnasal drip  Neck: No JVD, no TMG, no carotid bruits  Lungs: No use of accessory muscles, distant, no wheeze, rales or rhonchi  Cardiovascular: RRR, heart sounds normal, no murmur or gallops, no peripheral edema  Musculoskeletal: No deformities, no cyanosis or clubbing  Neuro: alert, non focal  Skin: Warm, no lesions or rashes  Impression / Plans:  1- bilateral pulm nodules, concerning for primary lung malignancy. Plan ENB with biopsies. Risks and benefits discussed w patient and wife today.  2- COPD with recent AE, completing prednisone taper. Improved. Will watch closely intraop and post-op for possible bronchospasm. Steroids if needed.   Baltazar Apo, MD, PhD 07/26/2016, 7:24 AM [facility] [phone] or if no answer [phone]

## 2016-07-26 NOTE — Op Note (Signed)
Video Bronchoscopy with Electromagnetic Navigation Procedure Note  Date of Operation: 07/26/2016  Pre-op Diagnosis: bilateral pulmonary nodules   Post-op Diagnosis: same  Surgeon: Baltazar Apo  Assistants: none  Anesthesia: General endotracheal anesthesia  Operation: Flexible video fiberoptic bronchoscopy with electromagnetic navigation and biopsies.  Estimated Blood Loss: Minimal  Complications: none apparent  Indications and History: [name] [name]. is a 75 y.o. male with hx former tobacco, COPD, found to have bilateral pulmonary nodules on CT scan of the chest. A PET scan was performed on 06/18/16 that showed hypermetabolism. Recommendation was made to achieve tissue sampling via navigational bronchoscopy.  The risks, benefits, complications, treatment options and expected outcomes were discussed with the patient.  The possibilities of pneumothorax, pneumonia, reaction to medication, pulmonary aspiration, perforation of a viscus, bleeding, failure to diagnose a condition and creating a complication requiring transfusion or operation were discussed with the patient who freely signed the consent.    Description of Procedure: The patient was seen in the Preoperative Area, was examined and was deemed appropriate to proceed.  The patient was taken to OR 10, identified as [name] [name]. and the procedure verified as Flexible Video Fiberoptic Bronchoscopy.  A Time Out was held and the above information confirmed.   Prior to the date of the procedure a high-resolution CT scan of the chest was performed. Utilizing [place] a virtual tracheobronchial tree was generated to allow the creation of distinct navigation pathways to the patient's parenchymal abnormalities in the right lower lobe (target 1), right upper lobe (target 2), left upper lobe (target 3). After being taken to the operating room general anesthesia was initiated and the patient  was orally intubated. The  video fiberoptic bronchoscope was introduced via the endotracheal tube and a general inspection was performed which showed normal airways throughout. There were no endobronchial lesions or abnormal secretions seen. The extendable working channel and locator guide were introduced into the bronchoscope. The distinct navigation pathways prepared prior to this procedure were then utilized to navigate to within 0.5-1.2 cm of patient's lesion identified on CT scan. The right lower lobe and right upper lobe nodules were more easily targeted. The left upper lobe nodule had an adjacent airway which was utilized for sampling but the central component of the lesion could not be directly targeted. The extendable working channel was secured into place at each location and the locator guide was withdrawn. Under fluoroscopic guidance transbronchial needle brushings, transbronchial Wang needle biopsies, and transbronchial forceps biopsies were performed to be sent for cytology and pathology with samples collected as detailed below. A bronchioalveolar lavage was performed in the left upper lobe adjacent to target 3 and sent for cytology and microbiology (bacterial, fungal, AFB smears and cultures). At the end of the procedure a general airway inspection was performed and there was no evidence of active bleeding. The bronchoscope was removed.  The patient tolerated the procedure well. There was no significant blood loss and there were no obvious complications. A post-procedural chest x-ray is pending.  Samples: 1. Transbronchial needle brushings from right lower lobe, target 1 2. Transbronchial Wang needle biopsies from right lower lobe, target 1 3. Transbronchial forceps biopsies from right lower lobe, target 1 4. Transbronchial needle brushings from right upper lobe, target 2 5. Transbronchial forceps biopsies from right upper lobe, target 2 6. Transbronchial needle brushings from left upper lobe, target 3 7.  Transbronchial Wang needle biopsies from left upper lobe, target 3 8. Transbronchial forceps biopsies from left upper  lobe, target 3 9. Bronchoalveolar lavage from left upper lobe  Plans:  The patient will be discharged from the PACU to home when recovered from anesthesia and after chest x-ray is reviewed. We will review the cytology, pathology and microbiology results with the patient when they become available. Outpatient followup will be with Dr Lamonte Sakai.    Baltazar Apo, MD, PhD 07/26/2016, 9:01 AM [facility] [phone] or if no answer [phone]

## 2016-07-26 NOTE — Anesthesia Procedure Notes (Signed)
Procedure Name: Intubation Date/Time: 07/26/2016 7:37 AM Performed by: Myna Bright Pre-anesthesia Checklist: Patient identified, Emergency Drugs available, Suction available and Patient being monitored Patient Re-evaluated:Patient Re-evaluated prior to inductionOxygen Delivery Method: Circle system utilized Preoxygenation: Pre-oxygenation with 100% oxygen Intubation Type: IV induction Ventilation: Mask ventilation without difficulty Laryngoscope Size: Mac and 4 Grade View: Grade II Tube type: Oral Tube size: 8.5 mm Number of attempts: 1 Airway Equipment and Method: Stylet and LTA kit utilized Placement Confirmation: ETT inserted through vocal cords under direct vision,  positive ETCO2 and breath sounds checked- equal and bilateral Secured at: 22 cm Tube secured with: Tape Dental Injury: Teeth and Oropharynx as per pre-operative assessment

## 2016-07-26 NOTE — Anesthesia Preprocedure Evaluation (Signed)
Anesthesia Evaluation  Patient identified by MRN, date of birth, ID band Patient awake    Reviewed: Allergy & Precautions, NPO status , Patient's Chart, lab work & pertinent test results  Airway Mallampati: II  TM Distance: >3 FB Neck ROM: Full    Dental no notable dental hx.    Pulmonary COPD, Current Smoker,    Pulmonary exam normal breath sounds clear to auscultation       Cardiovascular hypertension, Normal cardiovascular exam Rhythm:Regular Rate:Normal     Neuro/Psych negative neurological ROS  negative psych ROS   GI/Hepatic negative GI ROS, Neg liver ROS,   Endo/Other  negative endocrine ROS  Renal/GU negative Renal ROS  negative genitourinary   Musculoskeletal negative musculoskeletal ROS (+)   Abdominal   Peds negative pediatric ROS (+)  Hematology negative hematology ROS (+)   Anesthesia Other Findings   Reproductive/Obstetrics negative OB ROS                             Anesthesia Physical Anesthesia Plan  ASA: III  Anesthesia Plan: General   Post-op Pain Management:    Induction: Intravenous  Airway Management Planned: Oral ETT  Additional Equipment:   Intra-op Plan:   Post-operative Plan: Extubation in OR  Informed Consent: I have reviewed the patients History and Physical, chart, labs and discussed the procedure including the risks, benefits and alternatives for the proposed anesthesia with the patient or authorized representative who has indicated his/her understanding and acceptance.   Dental advisory given  Plan Discussed with: CRNA and Surgeon  Anesthesia Plan Comments:         Anesthesia Quick Evaluation

## 2016-07-26 NOTE — Transfer of Care (Signed)
Immediate Anesthesia Transfer of Care Note  Patient: [name] [name].  Procedure(s) Performed: Procedure(s): VIDEO BRONCHOSCOPY WITH ENDOBRONCHIAL NAVIGATION (N/A)  Patient Location: PACU  Anesthesia Type:General  Level of Consciousness: awake, alert , oriented and patient cooperative  Airway & Oxygen Therapy: Patient Spontanous Breathing and Patient connected to face mask oxygen  Post-op Assessment: Report given to RN, Post -op Vital signs reviewed and stable and Patient moving all extremities  Post vital signs: Reviewed and stable  Last Vitals:  Vitals:   07/26/16 0547  BP: (!) 142/68  Pulse: 81  Resp: 20  Temp: 36.5 C    Last Pain:  Vitals:   07/26/16 0547  TempSrc: Oral         Complications: No apparent anesthesia complications

## 2016-07-26 NOTE — Discharge Instructions (Signed)
Flexible Bronchoscopy, Care After These instructions give you information on caring for yourself after your procedure. Your doctor may also give you more specific instructions. Call your doctor if you have any problems or questions after your procedure. Follow these instructions at home:  Do not eat or drink anything for 2 hours after your procedure. If you try to eat or drink before the medicine wears off, food or drink could go into your lungs. You could also burn yourself.  After 2 hours have passed and when you can cough and gag normally, you may eat soft food and drink liquids slowly.  The day after the test, you may eat your normal diet.  You may do your normal activities.  Keep all doctor visits. Get help right away if:  You get more and more short of breath.  You get light-headed.  You feel like you are going to pass out (faint).  You have chest pain.  You have new problems that worry you.  You cough up more than a little blood.  You cough up more blood than before. This information is not intended to replace advice given to you by your health care provider. Make sure you discuss any questions you have with your health care provider. Document Released: 05/13/2009 Document Revised: 12/22/2015 Document Reviewed: 03/20/2013 Elsevier Interactive Patient Education  2017 [place].  Please call our office for any concerns or questions. [phone].

## 2016-07-27 ENCOUNTER — Encounter (HOSPITAL_COMMUNITY): Payer: Self-pay | Admitting: Emergency Medicine

## 2016-07-28 LAB — CULTURE, RESPIRATORY W GRAM STAIN
Culture: NO GROWTH
Gram Stain: NONE SEEN

## 2016-08-01 ENCOUNTER — Telehealth: Payer: Self-pay | Admitting: Emergency Medicine

## 2016-08-01 NOTE — Telephone Encounter (Signed)
Spoke with pt and reviewed the biopsy results w him - shows no malignancy, some normal epithelium, acanthosis and fibrotic change. Etiology unclear but no evidence cancer. We will plan to repeat CT chest in June 2018.

## 2016-08-07 ENCOUNTER — Other Ambulatory Visit: Payer: Self-pay | Admitting: Acute Care

## 2016-08-07 DIAGNOSIS — R9389 Abnormal findings on diagnostic imaging of other specified body structures: Secondary | ICD-10-CM

## 2016-08-20 ENCOUNTER — Ambulatory Visit (INDEPENDENT_AMBULATORY_CARE_PROVIDER_SITE_OTHER): Payer: Medicare Other | Admitting: Emergency Medicine

## 2016-08-20 ENCOUNTER — Encounter: Payer: Self-pay | Admitting: Emergency Medicine

## 2016-08-20 DIAGNOSIS — J449 Chronic obstructive pulmonary disease, unspecified: Secondary | ICD-10-CM

## 2016-08-20 DIAGNOSIS — R918 Other nonspecific abnormal finding of lung field: Secondary | ICD-10-CM

## 2016-08-20 DIAGNOSIS — Z720 Tobacco use: Secondary | ICD-10-CM

## 2016-08-20 NOTE — Assessment & Plan Note (Signed)
He is currently taking Chantix. He is working on setting a quit date for next Monday. I supported this.

## 2016-08-20 NOTE — Assessment & Plan Note (Signed)
Etiology unclear but biopsies were reassuring. There was evidence for fibrotic change with acanthosis. I believe since he is asymptomatic we can follow him with a repeat CT scan in June 2018. If the nodule change in any way we will plan an alternative means of biopsy

## 2016-08-20 NOTE — Assessment & Plan Note (Signed)
continue Anoro. discussed smoking cessation.  Albuterol prn. He will think about whether he wants a nebulizer for home.

## 2016-08-20 NOTE — Patient Instructions (Signed)
Please continue your Anoro once a day Take albuterol (ProAir) 2 puffs up to every 4 hours if needed for shortness of breath.  We will repeat your Ct scan chest in June 2018.  Follow up in June 2018 after the Ct scan to review.

## 2016-08-20 NOTE — Progress Notes (Signed)
Subjective:    Patient ID: [name] Kemps., male    DOB: 01/12/1941, 76 y.o.   MRN: [id number]  HPI 76 yo man with hx tobacco use (40 pack years), hx CAD, HTN, hyperlipidemia, COPD diagnosed in . Currently managed w samples of either Anoro or Breo, Incruse. He has been Participating in the low-dose CT scan lung cancer screening program since October 2016. He had a repeat scan 06/02/16 that I personally reviewed. This scan identified new nodules compared with one year prior. There was an 11.7 mm left upper lobe pulmonary nodule, irregular 14.9 mm per segmental right lower lobe nodule and 13.1 mm posterior right upper lobe nodule with some central clearing. He is evaluated today for evaluation of the nodules. He tells me that he had a URI vs AE-COPD in September - this had cleared by the time the scan was done.   He has been trying to quit smoking - has been on chantix x 30 days.  No weight loss, decreased cough occas prod at night, no hemoptysis.   ROV 07/12/16 -- this follow-up visit for history of tobacco use and COPD as well as new pulmonary nodules identified on low-dose CT scan screening. His CT scan from 06/02/1710.7 mm left upper lobe nodule, irregular 14.9 mm segmental right lower lobe nodule. A stone this we arrange for a PET scan that was done on 06/18/16. He returns today to review the results. I have reviewed the scan myself. This shows hypermetabolism of the dominant right lower lobe nodule L as some hypermetabolism in the 2 Center meter posterior right upper lobe nodule, 0.7 cm right upper lobe nodule, 1.1 cm left upper lobe nodule. No evidence of distal disease. He is currently on Breo, rotates with Incruse and Anoro.   ROV 08/20/16 -- This follow-up visit for history of COPD and an abnormal CT scan of the chest with hypermetabolic pulmonary nodular disease. He underwent bronchoscopy on 07/26/16 with navigational biopsies that were negative for malignancy. He is currently on Anoro and  tolerates well.    Review of Systems  Constitutional: Negative for fever and unexpected weight change.  HENT: Positive for dental problem. Negative for congestion, ear pain, nosebleeds, postnasal drip, rhinorrhea, sinus pressure, sneezing, sore throat and trouble swallowing.   Eyes: Negative for redness and itching.  Respiratory: Positive for cough and shortness of breath. Negative for chest tightness and wheezing.   Cardiovascular: Negative for palpitations and leg swelling.  Gastrointestinal: Negative for nausea and vomiting.  Genitourinary: Negative for dysuria.  Musculoskeletal: Negative for joint swelling.  Skin: Negative for rash.  Neurological: Negative for headaches.  Hematological: Does not bruise/bleed easily.  Psychiatric/Behavioral: Negative for dysphoric mood. The patient is not nervous/anxious.     Past Medical History:  Diagnosis Date  . Alcohol abuse   . Arthritis   . BPH with elevated PSA   . COPD (chronic obstructive pulmonary disease) ([facility])   . Coronary artery disease   . Dyspnea   . ED (erectile dysfunction)   . Elevated PSA   . GERD (gastroesophageal reflux disease)   . Hyperlipidemia   . Hypertension   . Kidney cysts   . Palpitations      Family History  Problem Relation Age of Onset  . Hypertension Mother   . Alzheimer's disease Mother      Social History   Social History  . Marital status: Married    Spouse name: N/A  . Number of children: N/A  . Years of  education: N/A   Occupational History  . Not on file.   Social History Main Topics  . Smoking status: Current Every Day Smoker    Packs/day: 0.25    Years: 40.00    Types: Cigarettes  . Smokeless tobacco: Never Used     Comment: Currently smokes 1/2 pack per day.  . Alcohol use No     Comment: quit drinking 2014  . Drug use: No  . Sexual activity: Not on file   Other Topics Concern  . Not on file   Social History Narrative  . 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  Never birds.  stays active, likes to golf   Allergies  Allergen Reactions  . No Known Allergies      Outpatient Medications Prior to Visit  Medication Sig Dispense Refill  . Cholecalciferol (VITAMIN D) 2000 units CAPS Take 2,000 Units by mouth daily.    Marland Kitchen esomeprazole (NEXIUM) 20 MG capsule Take 20 mg by mouth daily at 12 noon.    . umeclidinium-vilanterol (ANORO ELLIPTA) 62.5-25 MCG/INH AEPB Inhale 1 puff into the lungs daily.     Facility-Administered Medications Prior to Visit  Medication Dose Route Frequency Provider Last Rate Last Dose  . thallous chloride (201 THALLIUM) injection 4 milli Curie  4 millicurie Intravenous Once PRN Larey Dresser, MD            Objective:   Physical Exam Vitals:   08/20/16 1526  BP: 122/60  Pulse: 88  SpO2: 99%  Weight: 161 lb 12.8 oz (73.4 kg)  Height: 5' 10.5" (1.791 m)   Gen: Pleasant, well-nourished, in no distress,  normal affect  ENT: No lesions,  mouth clear,  oropharynx clear, no postnasal drip  Neck: No JVD, no TMG, no carotid bruits  Lungs: No use of accessory muscles, no dullness to percussion, clear without rales or rhonchi  Cardiovascular: RRR, heart sounds normal, no murmur or gallops, no peripheral edema  Musculoskeletal: No deformities, no cyanosis or clubbing  Neuro: alert, non focal  Skin: Warm, no lesions or rashes    PET 06/18/16 --  COMPARISON:  06/01/2016 screening chest CT.  FINDINGS: NECK  No hypermetabolic lymph nodes in the neck.  CHEST  Left main, left anterior descending, left circumflex and right coronary atherosclerosis. Atherosclerotic nonaneurysmal thoracic aorta. No hypermetabolic axillary, mediastinal or hilar lymph nodes. No pleural effusions. Moderate centrilobular emphysema and mild diffuse bronchial wall thickening.  There is a dominant hypermetabolic irregular 2.5 x 2.4 cm superior segment right lower lobe pulmonary nodule abutting and  distorting the posterior pleura with max SUV 3.6 (series 8/image 46), not appreciably changed in size from 06/01/2016 chest CT.  There is an irregular hypermetabolic 2.0 x 1.9 cm posterior right upper lobe pulmonary nodule abutting the right major fissure with max SUV 3.1 (series 8/image 42), not appreciably changed in size from 06/01/2016 chest CT.  There is an irregular 0.7 cm right upper lobe pulmonary nodule with low level metabolism with max SUV 1.7 (series 8/ image 35), considered significant for a nodule of this small size, which is unchanged in size from 06/01/2016 chest CT.  There is a solid hypermetabolic 1.1 x 1.0 cm left upper lobe pulmonary nodule with max SUV 3.2 (series 8/image 32), not appreciably changed in size from 06/01/2016 chest CT.  There are at least 4 additional scattered pulmonary nodules in both lungs, largest 5 mm in the right upper lobe (series 8/image  32), below PET resolution.  ABDOMEN/PELVIS  No abnormal hypermetabolic activity within the liver, pancreas, adrenal glands, or spleen. No hypermetabolic lymph nodes in the abdomen or pelvis. Small segmental focus of hypermetabolism in the cecum without definite wall thickening or other correlate on the CT images, favor physiologic activity. Exophytic simple 5.5 cm renal cyst in the lateral lower right kidney. Mildly enlarged prostate with nonspecific internal prostatic calcifications. Atherosclerotic nonaneurysmal abdominal aorta.  SKELETON  No focal hypermetabolic activity to suggest skeletal metastasis.  IMPRESSION: 1. Dominant hypermetabolic (max SUV 3.6) irregular 2.5 cm superior segment right lower lobe pulmonary nodule abutting and distorting the posterior pleura, suspicious for primary bronchogenic carcinoma. This nodule probably represents the best initial target for tissue sampling. 2. Three additional smaller hypermetabolic pulmonary nodules, measuring 2.0 cm in the posterior  right upper lobe, 0.7 cm in the right upper lobe and 1.1 cm in the left upper lobe. These could represent synchronous primary bronchogenic carcinomas and/or pulmonary metastases. 3. No hypermetabolic thoracic lymphadenopathy or distant metastatic disease. No pleural effusions. 4. Aortic atherosclerosis. Left main and 3 vessel coronary atherosclerosis. 5. Moderate centrilobular emphysema and mild diffuse bronchial wall thickening, suggesting COPD       Assessment & Plan:  Tobacco use He is currently taking Chantix. He is working on setting a quit date for next Monday. I supported this.   Pulmonary nodules Etiology unclear but biopsies were reassuring. There was evidence for fibrotic change with acanthosis. I believe since he is asymptomatic we can follow him with a repeat CT scan in June 2018. If the nodule change in any way we will plan an alternative means of biopsy  COPD (chronic obstructive pulmonary disease) (HCC) continue Anoro. discussed smoking cessation.  Albuterol prn. He will think about whether he wants a nebulizer for home.   Baltazar Apo, MD, PhD 08/20/2016, 4:22 PM [facility] [phone] or if no answer [phone]

## 2016-09-05 ENCOUNTER — Ambulatory Visit (INDEPENDENT_AMBULATORY_CARE_PROVIDER_SITE_OTHER): Payer: Medicare Other

## 2016-09-05 ENCOUNTER — Ambulatory Visit (INDEPENDENT_AMBULATORY_CARE_PROVIDER_SITE_OTHER): Payer: Medicare Other | Admitting: Physician Assistant

## 2016-09-05 ENCOUNTER — Encounter (INDEPENDENT_AMBULATORY_CARE_PROVIDER_SITE_OTHER): Payer: Self-pay | Admitting: Orthopaedic Surgery

## 2016-09-05 DIAGNOSIS — M25532 Pain in left wrist: Secondary | ICD-10-CM

## 2016-09-05 DIAGNOSIS — M25531 Pain in right wrist: Secondary | ICD-10-CM | POA: Diagnosis not present

## 2016-09-05 DIAGNOSIS — M25572 Pain in left ankle and joints of left foot: Secondary | ICD-10-CM | POA: Diagnosis not present

## 2016-09-05 MED ORDER — DICLOFENAC SODIUM 1 % TD GEL: 2.0000 g | Freq: Four times a day (QID) | TRANSDERMAL | Status: AC

## 2016-09-05 NOTE — Progress Notes (Signed)
Office Visit Note   Patient: [name] [name].           Date of Birth: 07-10-1941           MRN: [id number] Visit Date: 09/05/2016              Requested by: Josetta Huddle, MD [address] [place], [place] [address] PCP: Henrine Screws, MD   Assessment & Plan: Visit Diagnoses:  1. Pain in left wrist   2. Pain in right wrist   3. Sinus tarsi syndrome of left ankle     Plan: I have him apply 2 g of hearing gel to the wrist sinus Tarsi and left knee up to 4 times daily as needed. Otherwise and follow with [place] on a when necessary basis. Questions encouraged and answered  Follow-Up Instructions: Return if symptoms worsen or fail to improve.   Orders:  Orders Placed This Encounter  Procedures  . XR Wrist 2 Views Left   Meds ordered this encounter  Medications  . diclofenac sodium (VOLTAREN) 1 % transdermal gel 2 g      Procedures: No procedures performed   Clinical Data: No additional findings.   Subjective: Chief Complaint  Patient presents with  . Left Knee - Follow-up    Left knee is doing well overall, history of injections with relief. He was last seen on 06/14/16, had previously discussed hyaluronic acid injections.  . Left Wrist - Follow-up    Left wrist status post injection without relief    HPI [name] returns today playing both wrists hurting on lateral aspect only when playing golf. Does wear a brace on the left wrist will playing golf. He states the injection given to him by Dr. Trevor Mace last visit and left wrist did not help at all. 7 no pain in either wrist today. Denies any numbness tingling down either arm. Left knee status post injection 06/04/2016 by Dr. Ninfa Linden gave him good relief and is having no pain in the knee at all. He is having left ankle/foot pain for the last week with today he's having no pain at all. He is seen by podiatrist has orthotics and shoes and has pes planus Review of  Systems   Objective: Vital Signs: There were no vitals taken for this visit.  Physical Exam  Constitutional: He is oriented to person, place, and time. He appears well-developed and well-nourished. He appears distressed.  Cardiovascular: Intact distal pulses.   Pulmonary/Chest: Effort normal.  Neurological: He is alert and oriented to person, place, and time.  Skin: Skin is warm and dry.  Psychiatric: He has a normal mood and affect. His behavior is normal.    Ortho Exam Vital wrist he has excellent range of motion of the wrist without pain. No rashes skin lesions ulcerations or ecchymosis erythema or edema of either wrist. Full sensation throughout bilateral hands full motor bilateral hands. Positive grind test both thumbs. Foot he has tenderness over the sinus Tarsi region only. Good range of motion with dorsi/plantarflexion the ankle. No tenderness over the posterior tibial tendon and peroneal tendon. 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  Imaging: Xr Wrist 2 Views Left  Result Date: 09/05/2016 Left wrist AP lateral view: No acute fracture. CMC joint arthritic changes noted. Otherwise no acute findings.    PMFS History: Patient Active Problem List   Diagnosis Date Noted  . Pulmonary nodules 06/08/2016  . COPD (chronic  obstructive pulmonary disease) ([facility]) 06/08/2016  . Tobacco use 06/08/2016   Past Medical History:  Diagnosis Date  . Alcohol abuse   . Arthritis   . BPH with elevated PSA   . COPD (chronic obstructive pulmonary disease) ([facility])   . Coronary artery disease   . Dyspnea   . ED (erectile dysfunction)   . Elevated PSA   . GERD (gastroesophageal reflux disease)   . Hyperlipidemia   . Hypertension   . Kidney cysts   . Palpitations     Family History  Problem Relation Age of Onset  . Hypertension Mother   . Alzheimer's disease Mother     Past Surgical History:  Procedure Laterality Date  . CARDIAC  CATHETERIZATION  2002   50% RCA  . CATARACT EXTRACTION, BILATERAL    . EYE SURGERY    . KNEE ARTHROSCOPY    . MULTIPLE TOOTH EXTRACTIONS    . TONSILLECTOMY    . VIDEO BRONCHOSCOPY WITH ENDOBRONCHIAL NAVIGATION N/A 07/26/2016   Procedure: VIDEO BRONCHOSCOPY WITH ENDOBRONCHIAL NAVIGATION;  Surgeon: Collene Gobble, MD;  Location: [facility];  Service: Thoracic;  Laterality: N/A;   Social History   Occupational History  . Not on file.   Social History Main Topics  . Smoking status: Current Every Day Smoker    Packs/day: 0.25    Years: 40.00    Types: Cigarettes  . Smokeless tobacco: Never Used     Comment: Currently smokes 1/2 pack per day.  . Alcohol use No     Comment: quit drinking 2014  . Drug use: No  . Sexual activity: Not on file

## 2016-09-06 ENCOUNTER — Telehealth (INDEPENDENT_AMBULATORY_CARE_PROVIDER_SITE_OTHER): Payer: Self-pay | Admitting: Orthopaedic Surgery

## 2016-09-06 NOTE — Telephone Encounter (Signed)
Patient called advised the Rx for cortisone ointment is not at the pharmacy. Patient said he uses the [facility]  On [address]. The number to contact patient is [phone]

## 2016-09-07 ENCOUNTER — Other Ambulatory Visit (INDEPENDENT_AMBULATORY_CARE_PROVIDER_SITE_OTHER): Payer: Self-pay | Admitting: Orthopaedic Surgery

## 2016-09-07 MED ORDER — DICLOFENAC SODIUM 1 % TD GEL: 2.0000 g | Freq: Four times a day (QID) | TRANSDERMAL | 3 refills | Status: DC

## 2016-09-07 NOTE — Telephone Encounter (Signed)
[name] [name] was supposed to send in voltaren gel for him.  I just did it so hopefully it will get there

## 2016-09-07 NOTE — Telephone Encounter (Signed)
Please advise 

## 2016-09-17 ENCOUNTER — Telehealth (INDEPENDENT_AMBULATORY_CARE_PROVIDER_SITE_OTHER): Payer: Self-pay | Admitting: Orthopaedic Surgery

## 2016-09-17 NOTE — Telephone Encounter (Signed)
06/14/2016 progress note faxed to [facility] @ [place] **referring office

## 2016-12-17 ENCOUNTER — Other Ambulatory Visit: Payer: Self-pay | Admitting: Internal Medicine

## 2016-12-17 DIAGNOSIS — I739 Peripheral vascular disease, unspecified: Secondary | ICD-10-CM

## 2016-12-26 ENCOUNTER — Other Ambulatory Visit: Payer: Medicare Other

## 2016-12-28 ENCOUNTER — Ambulatory Visit
Admission: RE | Admit: 2016-12-28 | Discharge: 2016-12-28 | Disposition: A | Discharge status: Home / Self Care | Payer: Medicare Other | Source: Ambulatory Visit | Attending: Internal Medicine | Admitting: Internal Medicine

## 2016-12-28 DIAGNOSIS — I739 Peripheral vascular disease, unspecified: Secondary | ICD-10-CM

## 2017-01-02 ENCOUNTER — Other Ambulatory Visit: Payer: Self-pay | Admitting: Internal Medicine

## 2017-01-02 DIAGNOSIS — I739 Peripheral vascular disease, unspecified: Secondary | ICD-10-CM

## 2017-01-08 ENCOUNTER — Other Ambulatory Visit: Payer: Medicare Other

## 2017-01-10 ENCOUNTER — Other Ambulatory Visit: Payer: Self-pay | Admitting: Vascular Surgery

## 2017-01-10 DIAGNOSIS — I739 Peripheral vascular disease, unspecified: Secondary | ICD-10-CM

## 2017-01-14 ENCOUNTER — Other Ambulatory Visit: Payer: Medicare Other

## 2017-01-14 ENCOUNTER — Ambulatory Visit (INDEPENDENT_AMBULATORY_CARE_PROVIDER_SITE_OTHER)
Admission: RE | Admit: 2017-01-14 | Discharge: 2017-01-14 | Disposition: A | Discharge status: Home / Self Care | Payer: Medicare Other | Source: Ambulatory Visit | Attending: Acute Care | Admitting: Acute Care

## 2017-01-14 DIAGNOSIS — R938 Abnormal findings on diagnostic imaging of other specified body structures: Secondary | ICD-10-CM | POA: Diagnosis not present

## 2017-01-14 DIAGNOSIS — R9389 Abnormal findings on diagnostic imaging of other specified body structures: Secondary | ICD-10-CM

## 2017-01-23 ENCOUNTER — Encounter: Payer: Self-pay | Admitting: Emergency Medicine

## 2017-01-23 ENCOUNTER — Ambulatory Visit (INDEPENDENT_AMBULATORY_CARE_PROVIDER_SITE_OTHER): Payer: Medicare Other | Admitting: Emergency Medicine

## 2017-01-23 VITALS — BP 120/68 | HR 74 | Ht 70.5 in | Wt 170.0 lb

## 2017-01-23 DIAGNOSIS — J449 Chronic obstructive pulmonary disease, unspecified: Secondary | ICD-10-CM

## 2017-01-23 DIAGNOSIS — R918 Other nonspecific abnormal finding of lung field: Secondary | ICD-10-CM | POA: Diagnosis not present

## 2017-01-23 MED ORDER — ALBUTEROL SULFATE (2.5 MG/3ML) 0.083% IN NEBU: 2.5000 mg | INHALATION_SOLUTION | Freq: Four times a day (QID) | RESPIRATORY_TRACT | 5 refills | Status: DC | PRN

## 2017-01-23 NOTE — Progress Notes (Signed)
Subjective:    Patient ID: [name] Kemps., male    DOB: 1940/10/02, 76 y.o.   MRN: [id number]  HPI 76 yo man with hx tobacco use (40 pack years), hx CAD, HTN, hyperlipidemia, COPD diagnosed in . Currently managed w samples of either Anoro or Breo, Incruse. He has been Participating in the low-dose CT scan lung cancer screening program since October 2016. He had a repeat scan 06/02/16 that I personally reviewed. This scan identified new nodules compared with one year prior. There was an 11.7 mm left upper lobe pulmonary nodule, irregular 14.9 mm per segmental right lower lobe nodule and 13.1 mm posterior right upper lobe nodule with some central clearing. He is evaluated today for evaluation of the nodules. He tells me that he had a URI vs AE-COPD in September - this had cleared by the time the scan was done.   He has been trying to quit smoking - has been on chantix x 30 days.  No weight loss, decreased cough occas prod at night, no hemoptysis.   ROV 07/12/16 -- this follow-up visit for history of tobacco use and COPD as well as new pulmonary nodules identified on low-dose CT scan screening. His CT scan from 06/02/1710.7 mm left upper lobe nodule, irregular 14.9 mm segmental right lower lobe nodule. A stone this we arrange for a PET scan that was done on 06/18/16. He returns today to review the results. I have reviewed the scan myself. This shows hypermetabolism of the dominant right lower lobe nodule L as some hypermetabolism in the 2 Center meter posterior right upper lobe nodule, 0.7 cm right upper lobe nodule, 1.1 cm left upper lobe nodule. No evidence of distal disease. He is currently on Breo, rotates with Incruse and Anoro.   ROV 08/20/16 -- This follow-up visit for history of COPD and an abnormal CT scan of the chest with hypermetabolic pulmonary nodular disease. He underwent bronchoscopy on 07/26/16 with navigational biopsies that were negative for malignancy. He is currently on Anoro and  tolerates well.   ROV 01/23/17 -- Patient has a history of COPD for which she's been treated with Breo, rotates Anoro and incruse, currently using Anoro only. Performed a navigational bronchoscopy on 07/26/16 to evaluate hypermetabolic pulmonary nodular disease. The biopsies were negative. He underwent a repeat CT scan of the chest on 01/14/17 that I have personally reviewed. This shows that his multiple pulmonary nodules including a dominant 18 x 12 mm right lower lobe nodule have not changed compared with his prior study. He has been having trouble with some R LE claudication. He uses proAir when it it is hot / humid while golfing.   Review of Systems  Constitutional: Negative for fever and unexpected weight change.  HENT: Positive for dental problem. Negative for congestion, ear pain, nosebleeds, postnasal drip, rhinorrhea, sinus pressure, sneezing, sore throat and trouble swallowing.   Eyes: Negative for redness and itching.  Respiratory: Positive for cough and shortness of breath. Negative for chest tightness and wheezing.   Cardiovascular: Negative for palpitations and leg swelling.  Gastrointestinal: Negative for nausea and vomiting.  Genitourinary: Negative for dysuria.  Musculoskeletal: Negative for joint swelling.  Skin: Negative for rash.  Neurological: Negative for headaches.  Hematological: Does not bruise/bleed easily.  Psychiatric/Behavioral: Negative for dysphoric mood. The patient is not nervous/anxious.     Past Medical History:  Diagnosis Date  . Alcohol abuse   . Arthritis   . BPH with elevated PSA   . COPD (  chronic obstructive pulmonary disease) ([facility])   . Coronary artery disease   . Dyspnea   . ED (erectile dysfunction)   . Elevated PSA   . GERD (gastroesophageal reflux disease)   . Hyperlipidemia   . Hypertension   . Kidney cysts   . Palpitations      Family History  Problem Relation Age of Onset  . Hypertension Mother   . Alzheimer's disease Mother       Social History   Social History  . Marital status: Married    Spouse name: N/A  . Number of children: N/A  . Years of education: N/A   Occupational History  . Not on file.   Social History Main Topics  . Smoking status: Former Smoker    Packs/day: 0.25    Years: 40.00    Types: Cigarettes    Quit date: 08/28/2016  . Smokeless tobacco: Never Used     Comment: Currently using a Vape  . Alcohol use No     Comment: quit drinking 2014  . Drug use: No  . Sexual activity: Not on file   Other Topics Concern  . Not on file   Social History Narrative  . 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  stays active, likes to golf   Allergies  Allergen Reactions  . No Known Allergies      Outpatient Medications Prior to Visit  Medication Sig Dispense Refill  . Cholecalciferol (VITAMIN D) 2000 units CAPS Take 2,000 Units by mouth daily.    Marland Kitchen umeclidinium-vilanterol (ANORO ELLIPTA) 62.5-25 MCG/INH AEPB Inhale 1 puff into the lungs daily.    . diclofenac sodium (VOLTAREN) 1 % GEL Apply 2 g topically 4 (four) times daily. 100 g 3  . esomeprazole (NEXIUM) 20 MG capsule Take 20 mg by mouth daily at 12 noon.     Facility-Administered Medications Prior to Visit  Medication Dose Route Frequency Provider Last Rate Last Dose  . thallous chloride (201 THALLIUM) injection 4 milli Curie  4 millicurie Intravenous Once PRN Larey Dresser, MD            Objective:   Physical Exam Vitals:   01/23/17 1358  BP: 120/68  Pulse: 74  SpO2: 97%  Weight: 170 lb (77.1 kg)  Height: 5' 10.5" (1.791 m)   Gen: Pleasant, well-nourished, in no distress,  normal affect  ENT: No lesions,  mouth clear,  oropharynx clear, no postnasal drip  Neck: No JVD, no TMG, no carotid bruits  Lungs: No use of accessory muscles, no dullness to percussion, clear without rales or rhonchi  Cardiovascular: RRR, heart sounds normal, no murmur or gallops, no peripheral  edema  Musculoskeletal: No deformities, no cyanosis or clubbing  Neuro: alert, non focal  Skin: Warm, no lesions or rashes    PET 06/18/16 --  COMPARISON:  06/01/2016 screening chest CT.  FINDINGS: NECK  No hypermetabolic lymph nodes in the neck.  CHEST  Left main, left anterior descending, left circumflex and right coronary atherosclerosis. Atherosclerotic nonaneurysmal thoracic aorta. No hypermetabolic axillary, mediastinal or hilar lymph nodes. No pleural effusions. Moderate centrilobular emphysema and mild diffuse bronchial wall thickening.  There is a dominant hypermetabolic irregular 2.5 x 2.4 cm superior segment right lower lobe pulmonary nodule abutting and distorting the posterior pleura with max SUV 3.6 (series 8/image 46), not appreciably changed in size from 06/01/2016 chest CT.  There is an irregular hypermetabolic 2.0 x 1.9  cm posterior right upper lobe pulmonary nodule abutting the right major fissure with max SUV 3.1 (series 8/image 42), not appreciably changed in size from 06/01/2016 chest CT.  There is an irregular 0.7 cm right upper lobe pulmonary nodule with low level metabolism with max SUV 1.7 (series 8/ image 35), considered significant for a nodule of this small size, which is unchanged in size from 06/01/2016 chest CT.  There is a solid hypermetabolic 1.1 x 1.0 cm left upper lobe pulmonary nodule with max SUV 3.2 (series 8/image 32), not appreciably changed in size from 06/01/2016 chest CT.  There are at least 4 additional scattered pulmonary nodules in both lungs, largest 5 mm in the right upper lobe (series 8/image 32), below PET resolution.  ABDOMEN/PELVIS  No abnormal hypermetabolic activity within the liver, pancreas, adrenal glands, or spleen. No hypermetabolic lymph nodes in the abdomen or pelvis. Small segmental focus of hypermetabolism in the cecum without definite wall thickening or other correlate on the CT images,  favor physiologic activity. Exophytic simple 5.5 cm renal cyst in the lateral lower right kidney. Mildly enlarged prostate with nonspecific internal prostatic calcifications. Atherosclerotic nonaneurysmal abdominal aorta.  SKELETON  No focal hypermetabolic activity to suggest skeletal metastasis.  IMPRESSION: 1. Dominant hypermetabolic (max SUV 3.6) irregular 2.5 cm superior segment right lower lobe pulmonary nodule abutting and distorting the posterior pleura, suspicious for primary bronchogenic carcinoma. This nodule probably represents the best initial target for tissue sampling. 2. Three additional smaller hypermetabolic pulmonary nodules, measuring 2.0 cm in the posterior right upper lobe, 0.7 cm in the right upper lobe and 1.1 cm in the left upper lobe. These could represent synchronous primary bronchogenic carcinomas and/or pulmonary metastases. 3. No hypermetabolic thoracic lymphadenopathy or distant metastatic disease. No pleural effusions. 4. Aortic atherosclerosis. Left main and 3 vessel coronary atherosclerosis. 5. Moderate centrilobular emphysema and mild diffuse bronchial wall thickening, suggesting COPD       Assessment & Plan:  Pulmonary nodules Biopsy negative in December 2017. He needs a repeat CT scan without contrast in December 2018.  COPD (chronic obstructive pulmonary disease) (HCC) Continue on Anoro as he has been taking it. We will order albuterol nebs for him. He will use these and also pro-air as needed  Baltazar Apo, MD, PhD 01/23/2017, 2:16 PM [facility] [phone] or if no answer [phone]

## 2017-01-23 NOTE — Assessment & Plan Note (Signed)
Biopsy negative in December 2017. He needs a repeat CT scan without contrast in December 2018.

## 2017-01-23 NOTE — Patient Instructions (Addendum)
Your pulmonary nodules are unchanged on your CT scan of the chest. We will need to repeat your CT scan in 06/2017 to insure that they are not changing.  Please continue the Anoro once a day Take albuterol (ProAir) 2 puffs up to every 4 hours if needed for shortness of breath.  We will obtain a nebulizer machine so you can use albuterol as needed for shortness of breath.  Follow with Dr Lamonte Sakai in 6 months or sooner if you have any problems

## 2017-01-23 NOTE — Assessment & Plan Note (Signed)
Continue on Anoro as he has been taking it. We will order albuterol nebs for him. He will use these and also pro-air as needed

## 2017-01-28 ENCOUNTER — Encounter: Payer: Self-pay | Admitting: Vascular Surgery

## 2017-02-05 ENCOUNTER — Ambulatory Visit (HOSPITAL_COMMUNITY)
Admission: RE | Admit: 2017-02-05 | Discharge: 2017-02-05 | Disposition: A | Discharge status: Home / Self Care | Payer: Medicare Other | Source: Ambulatory Visit | Attending: Vascular Surgery | Admitting: Vascular Surgery

## 2017-02-05 DIAGNOSIS — I739 Peripheral vascular disease, unspecified: Secondary | ICD-10-CM | POA: Insufficient documentation

## 2017-02-05 LAB — VAS US LOWER EXTREMITY ARTERIAL DUPLEX
LSFDPSV: -15 cm/s
RATIBDISTSYS: 35 cm/s
RIGHT POST TIB DIST SYS: 40 cm/s
Right super femoral dist sys PSV: -25 cm/s
Right super femoral mid sys PSV: -63 cm/s
Right super femoral prox sys PSV: 96 cm/s

## 2017-02-07 ENCOUNTER — Ambulatory Visit (INDEPENDENT_AMBULATORY_CARE_PROVIDER_SITE_OTHER): Payer: Medicare Other | Admitting: Vascular Surgery

## 2017-02-07 ENCOUNTER — Encounter: Payer: Self-pay | Admitting: Vascular Surgery

## 2017-02-07 VITALS — BP 136/76 | HR 78 | Temp 97.8°F | Resp 16 | Ht 70.5 in | Wt 167.0 lb

## 2017-02-07 DIAGNOSIS — I70219 Atherosclerosis of native arteries of extremities with intermittent claudication, unspecified extremity: Secondary | ICD-10-CM

## 2017-02-07 NOTE — Progress Notes (Signed)
Vitals:   02/07/17 1423  BP: (!) 146/79  Pulse: 78  Resp: 16  Temp: 97.8 F (36.6 C)  SpO2: 98%  Weight: 167 lb (75.8 kg)  Height: 5' 10.5" (1.791 m)

## 2017-02-07 NOTE — Progress Notes (Signed)
Patient name: [name] [name]. MRN: [id number] DOB: 1940/09/18 Sex: male   REASON FOR CONSULT:    Claudication. The consult is requested by Dr. Josetta Huddle.  HPI:   [name] [name]. is a pleasant 76 y.o. male,  He was sent for evaluation of claudication. The patient describes the gradual onset of left calf claudication that began approximately 2 months ago. He spends his pain in the left calf which is brought on by ambulation at approximately 200 yards. His symptoms are relieved with rest. There are no other aggravating or alleviating factors. He denies any hip or thigh claudication. He denies any symptoms on the right side. His activity is somewhat limited also by his COPD. He denies any history of rest pain or nonhealing ulcers.  His risk factors for peripheral vascular disease include hypercholesterolemia and tobacco use. He smoked 1 pack per day but quit 5 months ago. He denies any history of hypertension, diabetes, or family history of premature cardiovascular disease.  I have reviewed the records that were sent by Dr. [name]. The patient does have a history of chronic obstructive pulmonary disease. He has mixed hyperlipidemia and essential hypertension which have been under good control. His most recent labs show a hemoglobin of 14.9. LDL 123. Creatinine is 1.15 with a GFR of 62  Past Medical History:  Diagnosis Date  . Alcohol abuse   . Arthritis   . BPH with elevated PSA   . COPD (chronic obstructive pulmonary disease) ([place])   . Coronary artery disease   . Dyspnea   . ED (erectile dysfunction)   . Elevated PSA   . GERD (gastroesophageal reflux disease)   . Hyperlipidemia   . Hypertension   . Kidney cysts   . Palpitations     Family History  Problem Relation Age of Onset  . Hypertension Mother   . Alzheimer's disease Mother     SOCIAL HISTORY: Social History   Social History  . Marital status: Married    Spouse name: N/A  . Number of children: N/A  .  Years of education: N/A   Occupational History  . Not on file.   Social History Main Topics  . Smoking status: Former Smoker    Packs/day: 0.25    Years: 40.00    Types: Cigarettes    Quit date: 08/28/2016  . Smokeless tobacco: Never Used     Comment: Currently using a Vape  . Alcohol use No     Comment: quit drinking 2014  . Drug use: No  . Sexual activity: Not on file   Other Topics Concern  . Not on file   Social History Narrative  . No narrative on file    Allergies  Allergen Reactions  . No Known Allergies     Current Outpatient Prescriptions  Medication Sig Dispense Refill  . albuterol (PROVENTIL) (2.5 MG/3ML) 0.083% nebulizer solution Take 3 mLs (2.5 mg total) by nebulization every 6 (six) hours as needed for wheezing or shortness of breath. 150 mL 5  . Cholecalciferol (VITAMIN D) 2000 units CAPS Take 2,000 Units by mouth daily.    Marland Kitchen esomeprazole (NEXIUM) 20 MG capsule Take 20 mg by mouth daily at 12 noon.    . umeclidinium-vilanterol (ANORO ELLIPTA) 62.5-25 MCG/INH AEPB Inhale 1 puff into the lungs daily.     No current facility-administered medications for this visit.    Facility-Administered Medications Ordered in Other Visits  Medication Dose Route Frequency Provider Last Rate Last Dose  .  thallous chloride (201 THALLIUM) injection 4 milli Curie  4 millicurie Intravenous Once PRN Larey Dresser, MD        REVIEW OF SYSTEMS:  [X]  denotes positive finding, [ ]  denotes negative finding Cardiac  Comments:  Chest pain or chest pressure:    Shortness of breath upon exertion: X   Short of breath when lying flat:    Irregular heart rhythm:        Vascular    Pain in calf, thigh, or hip brought on by ambulation: X Left calf   Pain in feet at night that wakes you up from your sleep:     Blood clot in your veins:    Leg swelling:         Pulmonary    Oxygen at home:    Productive cough:     Wheezing:         Neurologic    Sudden weakness in arms or  legs:     Sudden numbness in arms or legs:     Sudden onset of difficulty speaking or slurred speech:    Temporary loss of vision in one eye:     Problems with dizziness:         Gastrointestinal    Blood in stool:     Vomited blood:         Genitourinary    Burning when urinating:     Blood in urine:        Psychiatric    Major depression:         Hematologic    Bleeding problems:    Problems with blood clotting too easily:        Skin    Rashes or ulcers:        Constitutional    Fever or chills:     PHYSICAL EXAM:   Vitals:   02/07/17 1423 02/07/17 1426  BP: (!) 146/79 136/76  Pulse: 78 78  Resp: 16   Temp: 97.8 F (36.6 C)   SpO2: 98%   Weight: 167 lb (75.8 kg)   Height: 5' 10.5" (1.791 m)     GENERAL: The patient is a well-nourished male, in no acute distress. The vital signs are documented above. CARDIAC: There is a regular rate and rhythm.  VASCULAR: I do not detect carotid bruits. On the right side, he has a palpable femoral pulse. I cannot palpate popliteal or pedal pulses. On the left side, he has a palpable femoral pulse. I cannot palpate popliteal or pedal pulses. He has no significant lower extremity swelling. PULMONARY: There is good air exchange bilaterally without wheezing or rales. ABDOMEN: Soft and non-tender with normal pitched bowel sounds.  MUSCULOSKELETAL: There are no major deformities or cyanosis. NEUROLOGIC: No focal weakness or paresthesias are detected. SKIN: There are no ulcers or rashes noted. PSYCHIATRIC: The patient has a normal affect.  DATA:    LOWER EXTREMITY ARTERIAL DUPLEX: I have reviewed the lower extremity arterial duplex scanning was done on 02/05/2017. This showed a possible occlusion of the right superficial femoral artery. There was also irregular plaque noted in the right deep femoral artery. In addition, the left superficial femoral artery also appeared to have a possible occlusion.  ARTERIAL DOPPLER STUDY: I did  review his arterial Doppler study that was done on 12/28/2016. This showed an ABI of 77% on the right and 64% on the left.  These numbers dropped to 53% on the right after exercise and 34% on the left  after exercise.  MEDICAL ISSUES:   BILATERAL SUPERFICIAL FEMORAL ARTERY OCCLUSIVE DISEASE: His duplex scan suggests that he has bilateral superficial femoral artery occlusive disease. He has stable claudication in the left calf. We have talked about the importance of staying off of tobacco which he quit 5 months ago. I've also discussed the importance of getting on a structure walking program and the importance of nutrition. If his symptoms progress and certainly we could consider arteriography. He might potentially be a candidate for an endovascular approach to his superficial femoral artery occlusive disease on the left. Likewise, if he developed rest pain or a nonhealing ulcer then we would want to pursue arteriography. I'll be happy to see him back in anytime if his symptoms progress.  Deitra Mayo [facility] [phone]

## 2017-07-25 ENCOUNTER — Ambulatory Visit (INDEPENDENT_AMBULATORY_CARE_PROVIDER_SITE_OTHER)
Admission: RE | Admit: 2017-07-25 | Discharge: 2017-07-25 | Disposition: A | Discharge status: Home / Self Care | Payer: Medicare Other | Source: Ambulatory Visit | Attending: Emergency Medicine | Admitting: Emergency Medicine

## 2017-07-25 DIAGNOSIS — R918 Other nonspecific abnormal finding of lung field: Secondary | ICD-10-CM | POA: Diagnosis not present

## 2017-08-06 ENCOUNTER — Ambulatory Visit: Payer: Medicare Other | Admitting: Emergency Medicine

## 2017-08-26 DIAGNOSIS — J449 Chronic obstructive pulmonary disease, unspecified: Secondary | ICD-10-CM | POA: Diagnosis not present

## 2017-08-27 ENCOUNTER — Ambulatory Visit: Payer: PPO | Admitting: Emergency Medicine

## 2017-09-04 ENCOUNTER — Ambulatory Visit: Payer: PPO | Admitting: Emergency Medicine

## 2017-09-04 ENCOUNTER — Encounter: Payer: Self-pay | Admitting: Emergency Medicine

## 2017-09-04 VITALS — BP 102/70 | HR 77 | Ht 70.5 in | Wt 175.0 lb

## 2017-09-04 DIAGNOSIS — R911 Solitary pulmonary nodule: Secondary | ICD-10-CM

## 2017-09-04 MED ORDER — FLUTICASONE-UMECLIDIN-VILANT 100-62.5-25 MCG/INH IN AEPB: 1.0000 | INHALATION_SPRAY | Freq: Every day | RESPIRATORY_TRACT | 0 refills | Status: DC

## 2017-09-04 NOTE — Progress Notes (Signed)
Subjective:    Patient ID: [name] Kemps., male    DOB: 01/24/1941, 77 y.o.   MRN: [id number]  COPD  He complains of shortness of breath. There is no cough or wheezing. Pertinent negatives include no ear pain, fever, headaches, postnasal drip, rhinorrhea, sneezing, sore throat or trouble swallowing. His past medical history is significant for COPD.   77 yo man with hx tobacco use (40 pack years), hx CAD, HTN, hyperlipidemia, COPD diagnosed in . Currently managed w samples of either Anoro or Breo, Incruse. He has been Participating in the low-dose CT scan lung cancer screening program since October 2016. He had a repeat scan 06/02/16 that I personally reviewed. This scan identified new nodules compared with one year prior. There was an 11.7 mm left upper lobe pulmonary nodule, irregular 14.9 mm per segmental right lower lobe nodule and 13.1 mm posterior right upper lobe nodule with some central clearing. He is evaluated today for evaluation of the nodules. He tells me that he had a URI vs AE-COPD in September - this had cleared by the time the scan was done.   He has been trying to quit smoking - has been on chantix x 30 days.  No weight loss, decreased cough occas prod at night, no hemoptysis.   ROV 07/12/16 -- this follow-up visit for history of tobacco use and COPD as well as new pulmonary nodules identified on low-dose CT scan screening. His CT scan from 06/02/1710.7 mm left upper lobe nodule, irregular 14.9 mm segmental right lower lobe nodule. A stone this we arrange for a PET scan that was done on 06/18/16. He returns today to review the results. I have reviewed the scan myself. This shows hypermetabolism of the dominant right lower lobe nodule L as some hypermetabolism in the 2 Center meter posterior right upper lobe nodule, 0.7 cm right upper lobe nodule, 1.1 cm left upper lobe nodule. No evidence of distal disease. He is currently on Breo, rotates with Incruse and Anoro.   ROV 08/20/16 --  This follow-up visit for history of COPD and an abnormal CT scan of the chest with hypermetabolic pulmonary nodular disease. He underwent bronchoscopy on 07/26/16 with navigational biopsies that were negative for malignancy. He is currently on Anoro and tolerates well.   ROV 01/23/17 -- Patient has a history of COPD for which she's been treated with Breo, rotates Anoro and incruse, currently using Anoro only. Performed a navigational bronchoscopy on 07/26/16 to evaluate hypermetabolic pulmonary nodular disease. The biopsies were negative. He underwent a repeat CT scan of the chest on 01/14/17 that I have personally reviewed. This shows that his multiple pulmonary nodules including a dominant 18 x 12 mm right lower lobe nodule have not changed compared with his prior study. He has been having trouble with some R LE claudication. He uses proAir when it it is hot / humid while golfing.   ROV 09/04/17 --this is a follow-up visit for 77 year old gentleman with a history of COPD.  He also has pulmonary nodules with a dominant right lower lobe nodule.  We performed a navigational bronchoscopy that was negative for malignancy back in 07/26/16.  We have been following with serial CT scans.  Most recent scan was 07/25/17 which I have reviewed.  This shows no significant change in his pulmonary nodules dating back to 07/17/16.  He reports that he sometimes has dyspnea on the golf course. He reports that he tried some Trelegy in December that he got as sample -  he felt that he did better on this than on Anoro. He is back to Anoro right now, since he is on insurance right now.  Difficult concern is samples to keep available to allow him to get through the donut hole when it occurs.                                                        Review of Systems  Constitutional: Negative for fever and unexpected weight change.  HENT: Negative for congestion, dental problem, ear pain, nosebleeds, postnasal drip, rhinorrhea, sinus  pressure, sneezing, sore throat and trouble swallowing.   Eyes: Negative for redness and itching.  Respiratory: Positive for shortness of breath. Negative for cough, chest tightness and wheezing.   Cardiovascular: Negative for palpitations and leg swelling.  Gastrointestinal: Negative for nausea and vomiting.  Genitourinary: Negative for dysuria.  Musculoskeletal: Negative for joint swelling.  Skin: Negative for rash.  Neurological: Negative for headaches.  Hematological: Does not bruise/bleed easily.  Psychiatric/Behavioral: Negative for dysphoric mood. The patient is not nervous/anxious.     Past Medical History:  Diagnosis Date  . Alcohol abuse   . Arthritis   . BPH with elevated PSA   . COPD (chronic obstructive pulmonary disease) ([place])   . Coronary artery disease   . Dyspnea   . ED (erectile dysfunction)   . Elevated PSA   . GERD (gastroesophageal reflux disease)   . Hyperlipidemia   . Hypertension   . Kidney cysts   . Palpitations      Family History  Problem Relation Age of Onset  . Hypertension Mother   . Alzheimer's disease Mother      Social History   Socioeconomic History  . Marital status: Married    Spouse name: Not on file  . Number of children: Not on file  . Years of education: Not on file  . Highest education level: Not on file  Social Needs  . Financial resource strain: Not on file  . Food insecurity - worry: Not on file  . Food insecurity - inability: Not on file  . Transportation needs - medical: Not on file  . Transportation needs - non-medical: Not on file  Occupational History  . Not on file  Tobacco Use  . Smoking status: Former Smoker    Packs/day: 0.25    Years: 40.00    Pack years: 10.00    Types: Cigarettes    Last attempt to quit: 08/28/2016    Years since quitting: 1.0  . Smokeless tobacco: Never Used  . Tobacco comment: Currently using a Vape  Substance and Sexual Activity  . Alcohol use: No    Alcohol/week: 0.0 oz     Comment: quit drinking 2014  . Drug use: No  . Sexual activity: Not on file  Other Topics Concern  . Not on file  Social History Narrative  . 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  Never birds.  stays active, likes to golf   Allergies  Allergen Reactions  . No Known Allergies      Outpatient Medications Prior to Visit  Medication Sig Dispense Refill  . albuterol (PROVENTIL) (2.5 MG/3ML) 0.083% nebulizer solution Take 3 mLs (2.5 mg total) by nebulization every 6 (six) hours as needed for wheezing or  shortness of breath. 150 mL 5  . Cholecalciferol (VITAMIN D) 2000 units CAPS Take 2,000 Units by mouth daily.    Marland Kitchen esomeprazole (NEXIUM) 20 MG capsule Take 20 mg by mouth daily at 12 noon.    . umeclidinium-vilanterol (ANORO ELLIPTA) 62.5-25 MCG/INH AEPB Inhale 1 puff into the lungs daily.     Facility-Administered Medications Prior to Visit  Medication Dose Route Frequency Provider Last Rate Last Dose  . thallous chloride (201 THALLIUM) injection 4 milli Curie  4 millicurie Intravenous Once PRN Larey Dresser, MD            Objective:   Physical Exam Vitals:   09/04/17 1627  BP: 102/70  Pulse: 77  SpO2: 100%  Weight: 175 lb (79.4 kg)  Height: 5' 10.5" (1.791 m)   Gen: Pleasant, well-nourished, in no distress,  normal affect  ENT: No lesions,  mouth clear,  oropharynx clear, no postnasal drip  Neck: No JVD, no TMG, no carotid bruits  Lungs: No use of accessory muscles, no dullness to percussion, clear without rales or rhonchi  Cardiovascular: RRR, heart sounds normal, no murmur or gallops, no peripheral edema  Musculoskeletal: No deformities, no cyanosis or clubbing  Neuro: alert, non focal  Skin: Warm, no lesions or rashes      Assessment & Plan:  Pulmonary nodules Stable on CT scan from 06/2017.  We will repeat in 06/2018 to look for interval change.  COPD (chronic obstructive pulmonary disease) ([place]) Currently managed on Anoro.  He has  been on Trelegy at least one occasion and thought that it may have been better.  He requests samples of Trelegy today to test this again.  I also believe he wants to have these on reserve in order to get through the donut hole later in the year.Baltazar Apo, MD, PhD 09/04/2017, 4:55 PM [facility] [phone] or if no answer [phone]

## 2017-09-04 NOTE — Assessment & Plan Note (Signed)
Stable on CT scan from 06/2017.  We will repeat in 06/2018 to look for interval change.

## 2017-09-04 NOTE — Patient Instructions (Signed)
Please continue Anoro once a day Keep albuterol available to use 2 puffs if needed for shortness of breath, wheezing, chest tightness.  We will give you samples of trelegy to use once a day. If you prefer the trelegy then we can consider changing to this in the future.  We will repeat your Ct chest in December 2019 to look for interval change.  Follow with Dr Lamonte Sakai in December after the CT scan or sooner if you have any problems

## 2017-09-04 NOTE — Assessment & Plan Note (Signed)
Currently managed on Anoro.  He has been on Trelegy at least one occasion and thought that it may have been better.  He requests samples of Trelegy today to test this again.  I also believe he wants to have these on reserve in order to get through the donut hole later in the year.[name] [name]

## 2017-09-26 DIAGNOSIS — J449 Chronic obstructive pulmonary disease, unspecified: Secondary | ICD-10-CM | POA: Diagnosis not present

## 2017-10-24 DIAGNOSIS — J449 Chronic obstructive pulmonary disease, unspecified: Secondary | ICD-10-CM | POA: Diagnosis not present

## 2017-11-05 DIAGNOSIS — D2372 Other benign neoplasm of skin of left lower limb, including hip: Secondary | ICD-10-CM | POA: Diagnosis not present

## 2017-11-24 DIAGNOSIS — J449 Chronic obstructive pulmonary disease, unspecified: Secondary | ICD-10-CM | POA: Diagnosis not present

## 2017-12-16 DIAGNOSIS — J441 Chronic obstructive pulmonary disease with (acute) exacerbation: Secondary | ICD-10-CM | POA: Diagnosis not present

## 2017-12-24 DIAGNOSIS — J449 Chronic obstructive pulmonary disease, unspecified: Secondary | ICD-10-CM | POA: Diagnosis not present

## 2018-01-21 DIAGNOSIS — D2372 Other benign neoplasm of skin of left lower limb, including hip: Secondary | ICD-10-CM | POA: Diagnosis not present

## 2018-01-24 DIAGNOSIS — J449 Chronic obstructive pulmonary disease, unspecified: Secondary | ICD-10-CM | POA: Diagnosis not present

## 2018-02-08 IMAGING — CT CT CHEST LUNG CANCER SCREENING LOW DOSE W/O CM
1 of 5 series · 14 of 40 positions shown, 18 images · non-contrast
Comparison: 05/09/2015 screening chest CT.

CLINICAL DATA: 75-year-old asymptomatic male current smoker with 40
pack-year smoking history.

EXAM:
CT CHEST WITHOUT CONTRAST LOW-DOSE FOR LUNG CANCER SCREENING
TECHNIQUE: Multidetector CT imaging of the chest was performed following the
standard protocol without IV contrast.

[Series 3: lung windows · axial · 0.70mm/px · z∈[-304,+40]mm · 14 of 305 slices shown, 18 images]
[im 15/305  mediastinal]
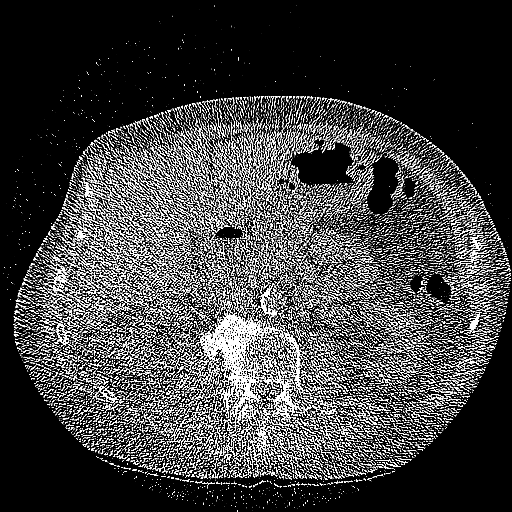
[im 15/305  lung]
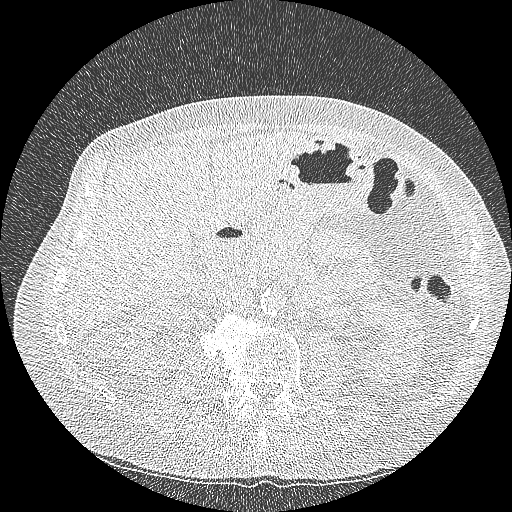
[im 44/305  lung]
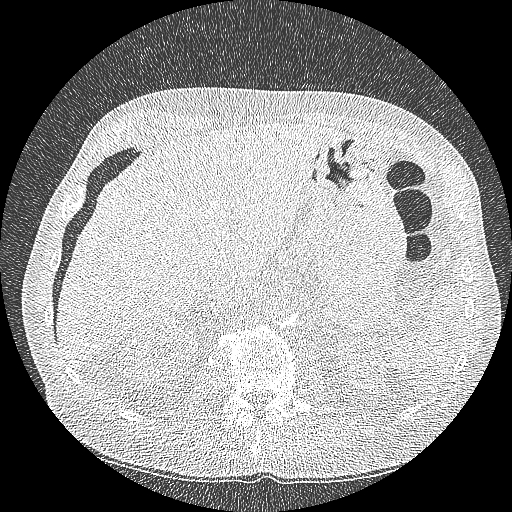
[im 58/305  lung]
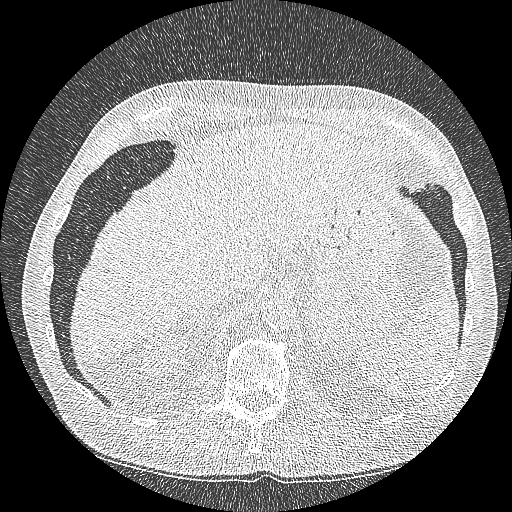
[im 87/305  lung]
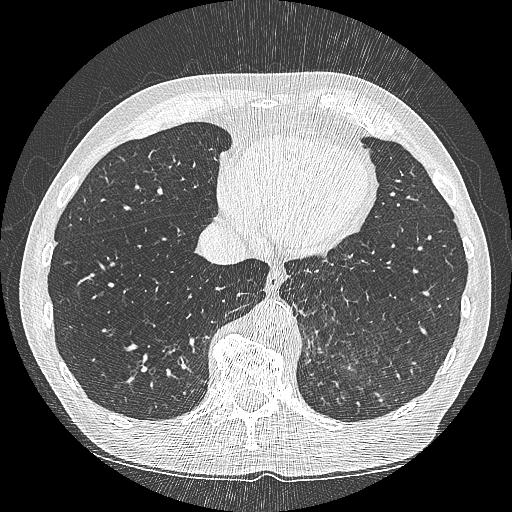
[im 102/305  mediastinal]
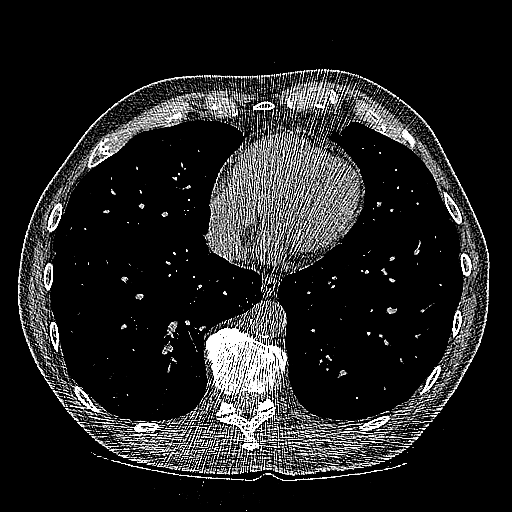
[im 102/305  lung]
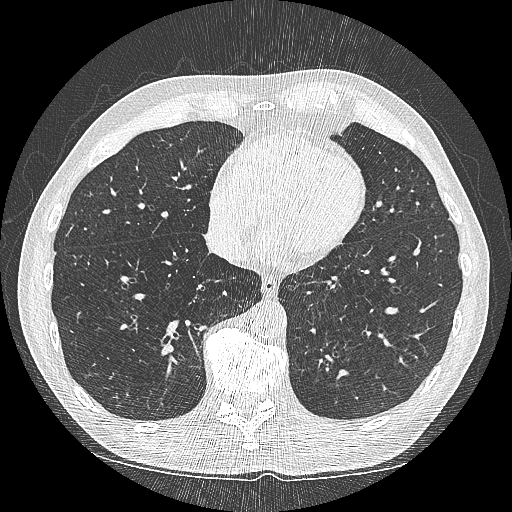
[im 116/305  lung]
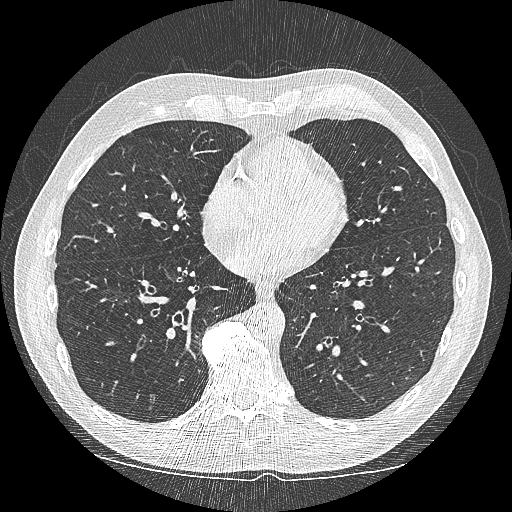
[im 145/305  lung]
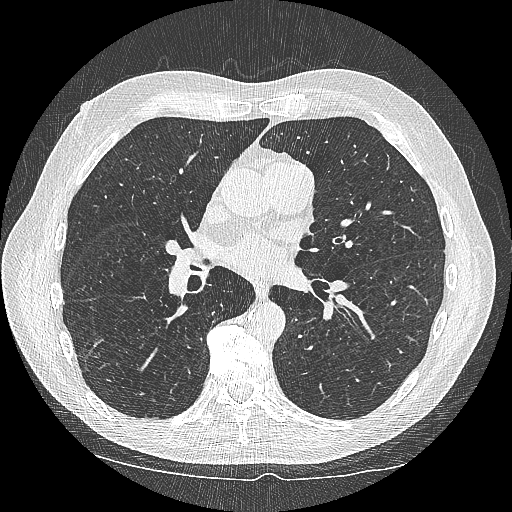
[im 160/305  lung]
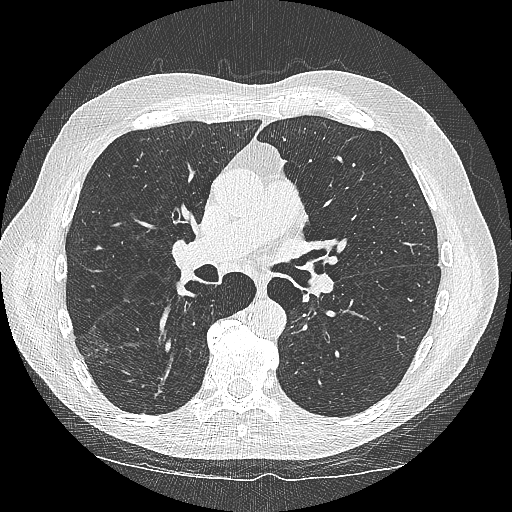
[im 189/305  mediastinal]
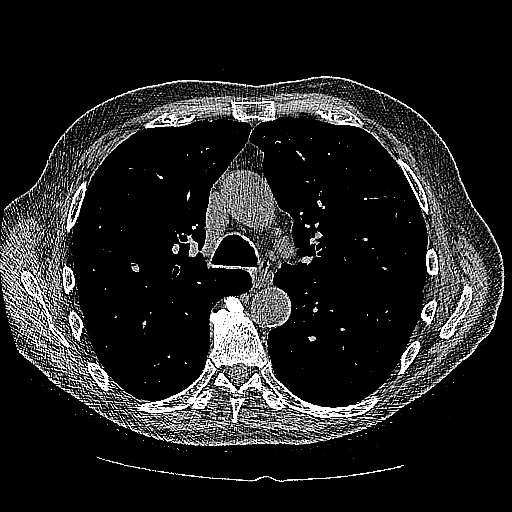
[im 189/305  lung]
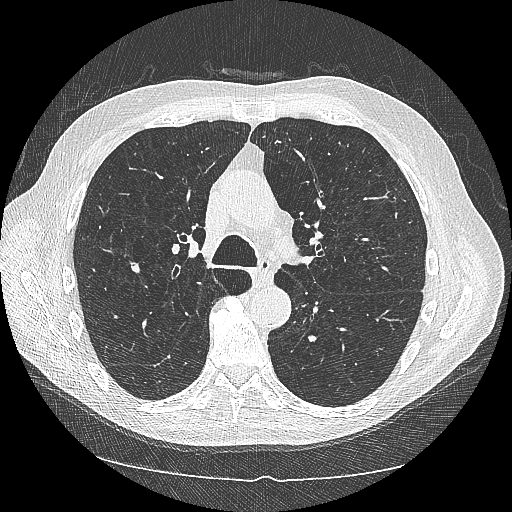
[im 203/305  lung]
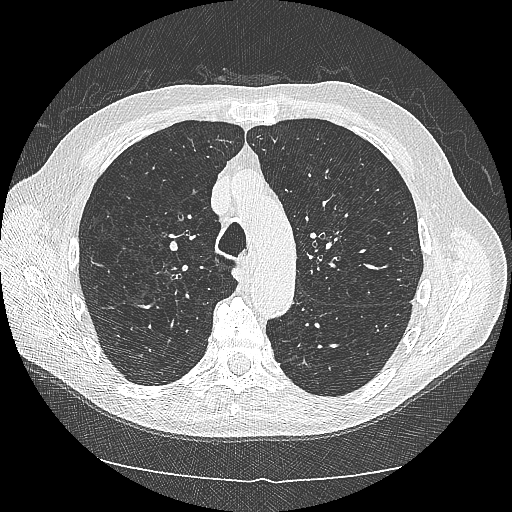
[im 232/305  lung]
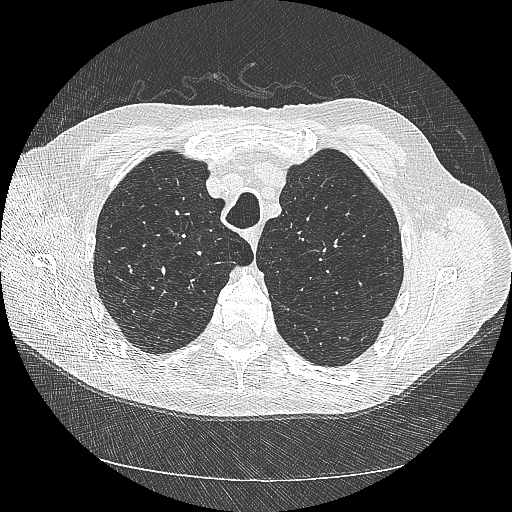
[im 247/305  lung]
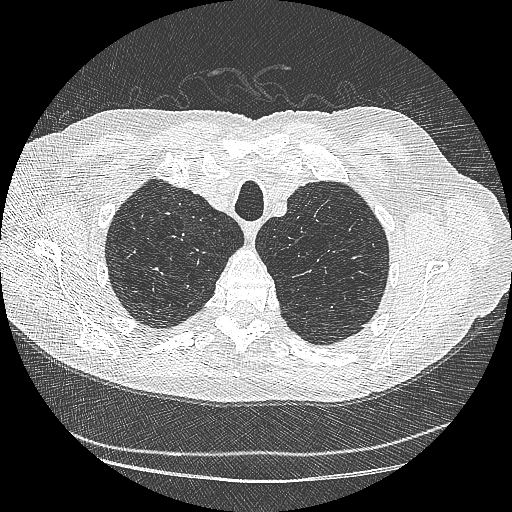
[im 261/305  mediastinal]
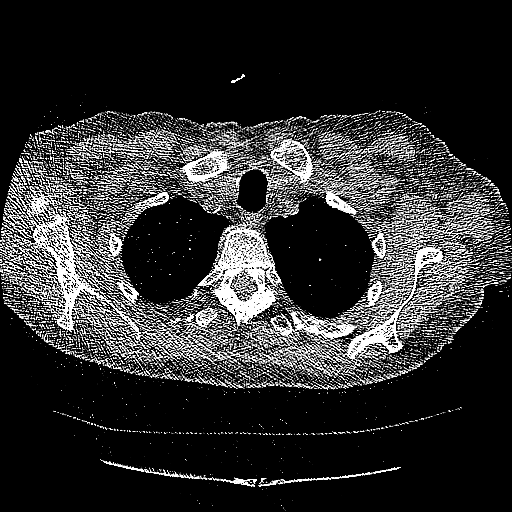
[im 261/305  lung]
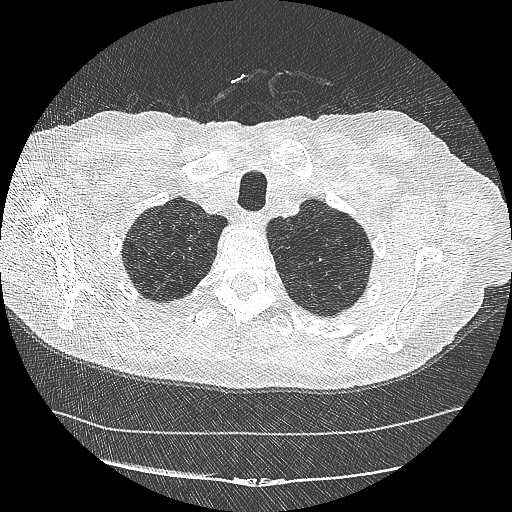
[im 290/305  lung]
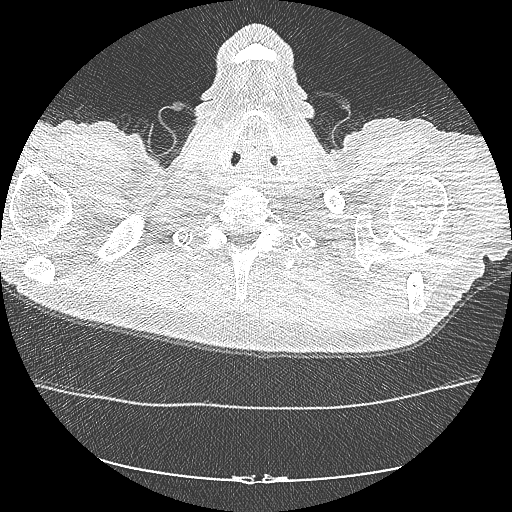

[14 of 40 positions shown; findings below may reference images not displayed]

FINDINGS: Cardiovascular: Normal heart size. No significant pericardial
fluid/thickening. Left main, left anterior descending and right
coronary atherosclerosis. Atherosclerotic nonaneurysmal thoracic
aorta. Normal caliber pulmonary arteries.

Mediastinum/Nodes: No discrete thyroid nodules. Unremarkable
esophagus. No pathologically enlarged axillary, mediastinal or gross
hilar lymph nodes, noting limited sensitivity for the detection of
hilar adenopathy on this noncontrast study.

Lungs/Pleura: No pneumothorax. No pleural effusion. Moderate
centrilobular emphysema with diffuse bronchial wall thickening. The
previously described pulmonary nodules have not significantly
changed. There are 5 new pulmonary nodules, with the largest as
follows:

- left upper lobe nodule measuring 11.7 mm in volume derived mean
diameter (series 3/ image 109)

- irregular posterior superior segment right lower lobe nodule
measuring 14.9 mm in volume derived mean diameter (series 3/ image
134)

- irregular posterior right upper lobe nodule measuring 13.1 mm in
volume derived mean diameter (series 3/ image 130)

Upper abdomen: Unremarkable.

Musculoskeletal: No aggressive appearing focal osseous lesions.
Moderate to marked thoracic spondylosis.
IMPRESSION: 1. Lung-RADS Category 4B-S, suspicious. Five new pulmonary nodules,
including solid 11.7 mm left upper lobe pulmonary nodule and
irregular 14.9 mm superior segment right lower lobe and 13.1 mm
posterior right upper lobe pulmonary nodules. Recommend further
evaluation with PET-CT for potential biopsy planning. If there is
any clinical suspicion of pneumonia, a trial of antibiotic therapy
preceding the PET-CT may be considered. Additional imaging
evaluation or consultation with pulmonary medicine or thoracic
surgery recommended.
2. The "S" modifier above refers to potentially clinically
significant non lung cancer related findings. Specifically, left
main and two-vessel coronary atherosclerosis .
3. Additional findings include aortic atherosclerosis and moderate
emphysema.

## 2018-02-18 DIAGNOSIS — J441 Chronic obstructive pulmonary disease with (acute) exacerbation: Secondary | ICD-10-CM | POA: Diagnosis not present

## 2018-04-01 DIAGNOSIS — D2372 Other benign neoplasm of skin of left lower limb, including hip: Secondary | ICD-10-CM | POA: Diagnosis not present

## 2018-04-02 ENCOUNTER — Encounter: Payer: Self-pay | Admitting: Emergency Medicine

## 2018-04-02 ENCOUNTER — Ambulatory Visit: Payer: PPO | Admitting: Emergency Medicine

## 2018-04-02 VITALS — BP 104/60 | HR 90 | Ht 70.5 in | Wt 175.0 lb

## 2018-04-02 DIAGNOSIS — R079 Chest pain, unspecified: Secondary | ICD-10-CM

## 2018-04-02 DIAGNOSIS — R911 Solitary pulmonary nodule: Secondary | ICD-10-CM

## 2018-04-02 DIAGNOSIS — J449 Chronic obstructive pulmonary disease, unspecified: Secondary | ICD-10-CM

## 2018-04-02 DIAGNOSIS — Z23 Encounter for immunization: Secondary | ICD-10-CM

## 2018-04-02 DIAGNOSIS — R0609 Other forms of dyspnea: Secondary | ICD-10-CM | POA: Insufficient documentation

## 2018-04-02 DIAGNOSIS — R0600 Dyspnea, unspecified: Secondary | ICD-10-CM | POA: Insufficient documentation

## 2018-04-02 HISTORY — DX: Dyspnea, unspecified: R06.00

## 2018-04-02 HISTORY — DX: Other forms of dyspnea: R06.09

## 2018-04-02 MED ORDER — TIOTROPIUM BROMIDE-OLODATEROL 2.5-2.5 MCG/ACT IN AERS: 2.0000 | INHALATION_SPRAY | Freq: Every day | RESPIRATORY_TRACT | 0 refills | Status: DC

## 2018-04-02 NOTE — Progress Notes (Signed)
Subjective:    Patient ID: [name] Kemps., male    DOB: 12/13/1940, 77 y.o.   MRN: [id number]  COPD  He complains of shortness of breath. There is no cough or wheezing. Pertinent negatives include no ear pain, fever, headaches, postnasal drip, rhinorrhea, sneezing, sore throat or trouble swallowing. His past medical history is significant for COPD.   77 yo man with hx tobacco use (40 pack years), hx CAD, HTN, hyperlipidemia, COPD diagnosed in . Currently managed w samples of either Anoro or Breo, Incruse. He has been Participating in the low-dose CT scan lung cancer screening program since October 2016. He had a repeat scan 06/02/16 that I personally reviewed. This scan identified new nodules compared with one year prior. There was an 11.7 mm left upper lobe pulmonary nodule, irregular 14.9 mm per segmental right lower lobe nodule and 13.1 mm posterior right upper lobe nodule with some central clearing. He is evaluated today for evaluation of the nodules. He tells me that he had a URI vs AE-COPD in September - this had cleared by the time the scan was done.   He has been trying to quit smoking - has been on chantix x 30 days.  No weight loss, decreased cough occas prod at night, no hemoptysis.   ROV 07/12/16 -- this follow-up visit for history of tobacco use and COPD as well as new pulmonary nodules identified on low-dose CT scan screening. His CT scan from 06/02/1710.7 mm left upper lobe nodule, irregular 14.9 mm segmental right lower lobe nodule. A stone this we arrange for a PET scan that was done on 06/18/16. He returns today to review the results. I have reviewed the scan myself. This shows hypermetabolism of the dominant right lower lobe nodule L as some hypermetabolism in the 2 Center meter posterior right upper lobe nodule, 0.7 cm right upper lobe nodule, 1.1 cm left upper lobe nodule. No evidence of distal disease. He is currently on Breo, rotates with Incruse and Anoro.   ROV 08/20/16 --  This follow-up visit for history of COPD and an abnormal CT scan of the chest with hypermetabolic pulmonary nodular disease. He underwent bronchoscopy on 07/26/16 with navigational biopsies that were negative for malignancy. He is currently on Anoro and tolerates well.   ROV 01/23/17 -- Patient has a history of COPD for which she's been treated with Breo, rotates Anoro and incruse, currently using Anoro only. Performed a navigational bronchoscopy on 07/26/16 to evaluate hypermetabolic pulmonary nodular disease. The biopsies were negative. He underwent a repeat CT scan of the chest on 01/14/17 that I have personally reviewed. This shows that his multiple pulmonary nodules including a dominant 18 x 12 mm right lower lobe nodule have not changed compared with his prior study. He has been having trouble with some R LE claudication. He uses proAir when it it is hot / humid while golfing.   ROV 09/04/17 --this is a follow-up visit for 77 year old gentleman with a history of COPD.  He also has pulmonary nodules with a dominant right lower lobe nodule.  We performed a navigational bronchoscopy that was negative for malignancy back in 07/26/16.  We have been following with serial CT scans.  Most recent scan was 07/25/17 which I have reviewed.  This shows no significant change in his pulmonary nodules dating back to 07/17/16.  He reports that he sometimes has dyspnea on the golf course. He reports that he tried some Trelegy in December that he got as sample -  he felt that he did better on this than on Anoro. He is back to Anoro right now, since he is on insurance right now.  Difficult concern is samples to keep available to allow him to get through the donut hole when it occurs.  ROV 04/02/18 --77 year old man with COPD and pulmonary nodules, dominant right lower lobe nodule that was negative for malignancy by navigational bronchoscopy 07/26/2016, next CT due in 06/2018.  He reports today that he has noticed some increased  dyspnea, most recently he had increased SOB while at altitude in [place]. He had SOB with walking short distance, also notes L arm pain with exertion. He has never had the arm or CP at sea level. He is avoiding walking when he golfs for the last year. He is currently on Anoro, is not sure that it is doing anything.  Her has tried Trelegy, felt that it was better, but he has saved samples to have available during the donut hole.                                                         Review of Systems  Constitutional: Negative for fever and unexpected weight change.  HENT: Negative for congestion, dental problem, ear pain, nosebleeds, postnasal drip, rhinorrhea, sinus pressure, sneezing, sore throat and trouble swallowing.   Eyes: Negative for redness and itching.  Respiratory: Positive for shortness of breath. Negative for cough, chest tightness and wheezing.   Cardiovascular: Negative for palpitations and leg swelling.  Gastrointestinal: Negative for nausea and vomiting.  Genitourinary: Negative for dysuria.  Musculoskeletal: Negative for joint swelling.  Skin: Negative for rash.  Neurological: Negative for headaches.  Hematological: Does not bruise/bleed easily.  Psychiatric/Behavioral: Negative for dysphoric mood. The patient is not nervous/anxious.        Objective:   Physical Exam Vitals:   04/02/18 1521  BP: 104/60  Pulse: 90  SpO2: 96%  Weight: 175 lb (79.4 kg)  Height: 5' 10.5" (1.791 m)   Gen: Pleasant, well-nourished, in no distress,  normal affect  ENT: No lesions,  mouth clear,  oropharynx clear, no postnasal drip  Neck: No JVD, no stridor  Lungs: No use of accessory muscles, no crackles, soft B exp wheezes.   Cardiovascular: RRR, heart sounds normal, no murmur or gallops, no peripheral edema  Musculoskeletal: No deformities, no cyanosis or clubbing  Neuro: alert, non focal  Skin: Warm, no lesions or rashes      Assessment & Plan:  Exertional  dyspnea Slow progressive exertional shortness of breath, but most recently he has had associated chest and left arm discomfort.  He does have a history of probable coronary disease, last stress test was 2016.  I think he needs further evaluation of this and I will refer to cardiology.  In parallel we will evaluate for progression of his COPD, hypoxemia, etc.  COPD (chronic obstructive pulmonary disease) (HCC) Walking oximetry today on room air. We will perform pulmonary function testing at your next office visit. Please stop Anoro for now. Please start Stiolto 2 puffs once daily.  Take this medication consistently for the next month so that we can see if you get more benefit. Keep albuterol available to use 2 puffs up to every 4 hours if needed for shortness of breath, chest tightness, wheezing. Follow with  Dr Lamonte Sakai in 1 month or next available with full PFT.  Pulmonary nodules We will repeat your CT scan of the chest in December 2019 to follow your right lower lobe nodule for stability.  Baltazar Apo, MD, PhD 04/02/2018, 3:59 PM [facility] [phone] or if no answer [phone]

## 2018-04-02 NOTE — Assessment & Plan Note (Signed)
Walking oximetry today on room air. We will perform pulmonary function testing at your next office visit. Please stop Anoro for now. Please start Stiolto 2 puffs once daily.  Take this medication consistently for the next month so that we can see if you get more benefit. Keep albuterol available to use 2 puffs up to every 4 hours if needed for shortness of breath, chest tightness, wheezing. Follow with Dr Lamonte Sakai in 1 month or next available with full PFT.

## 2018-04-02 NOTE — Patient Instructions (Signed)
Walking oximetry today on room air. We will perform pulmonary function testing at your next office visit. Please stop Anoro for now. Please start Stiolto 2 puffs once daily.  Take this medication consistently for the next month so that we can see if you get more benefit. Keep albuterol available to use 2 puffs up to every 4 hours if needed for shortness of breath, chest tightness, wheezing. We will refer you to cardiology for evaluation of exertional shortness of breath with associated arm pain. We will repeat your CT scan of the chest in December 2019 to follow your right lower lobe nodule for stability. Follow with Dr Lamonte Sakai in 1 month or next available with full PFT.

## 2018-04-02 NOTE — Assessment & Plan Note (Signed)
We will repeat your CT scan of the chest in December 2019 to follow your right lower lobe nodule for stability.

## 2018-04-02 NOTE — Assessment & Plan Note (Signed)
Slow progressive exertional shortness of breath, but most recently he has had associated chest and left arm discomfort.  He does have a history of probable coronary disease, last stress test was 2016.  I think he needs further evaluation of this and I will refer to cardiology.  In parallel we will evaluate for progression of his COPD, hypoxemia, etc.

## 2018-04-08 DIAGNOSIS — I10 Essential (primary) hypertension: Secondary | ICD-10-CM

## 2018-04-08 DIAGNOSIS — K6289 Other specified diseases of anus and rectum: Secondary | ICD-10-CM

## 2018-04-08 DIAGNOSIS — E782 Mixed hyperlipidemia: Secondary | ICD-10-CM | POA: Insufficient documentation

## 2018-04-08 DIAGNOSIS — M545 Low back pain, unspecified: Secondary | ICD-10-CM | POA: Insufficient documentation

## 2018-04-08 DIAGNOSIS — R143 Flatulence: Secondary | ICD-10-CM | POA: Insufficient documentation

## 2018-04-08 DIAGNOSIS — K219 Gastro-esophageal reflux disease without esophagitis: Secondary | ICD-10-CM

## 2018-04-08 DIAGNOSIS — J439 Emphysema, unspecified: Secondary | ICD-10-CM | POA: Diagnosis not present

## 2018-04-08 DIAGNOSIS — M189 Osteoarthritis of first carpometacarpal joint, unspecified: Secondary | ICD-10-CM

## 2018-04-08 DIAGNOSIS — G479 Sleep disorder, unspecified: Secondary | ICD-10-CM | POA: Insufficient documentation

## 2018-04-08 DIAGNOSIS — N410 Acute prostatitis: Secondary | ICD-10-CM

## 2018-04-08 DIAGNOSIS — E559 Vitamin D deficiency, unspecified: Secondary | ICD-10-CM | POA: Insufficient documentation

## 2018-04-08 DIAGNOSIS — R972 Elevated prostate specific antigen [PSA]: Secondary | ICD-10-CM

## 2018-04-08 DIAGNOSIS — H532 Diplopia: Secondary | ICD-10-CM | POA: Diagnosis not present

## 2018-04-08 DIAGNOSIS — I2510 Atherosclerotic heart disease of native coronary artery without angina pectoris: Secondary | ICD-10-CM | POA: Insufficient documentation

## 2018-04-08 DIAGNOSIS — H539 Unspecified visual disturbance: Secondary | ICD-10-CM | POA: Diagnosis not present

## 2018-04-08 HISTORY — DX: Vitamin D deficiency, unspecified: E55.9

## 2018-04-08 HISTORY — DX: Acute prostatitis: N41.0

## 2018-04-08 HISTORY — DX: Low back pain, unspecified: M54.50

## 2018-04-08 HISTORY — DX: Flatulence: R14.3

## 2018-04-08 HISTORY — DX: Elevated prostate specific antigen (PSA): R97.20

## 2018-04-08 HISTORY — DX: Essential (primary) hypertension: I10

## 2018-04-08 HISTORY — DX: Sleep disorder, unspecified: G47.9

## 2018-04-08 HISTORY — DX: Gastro-esophageal reflux disease without esophagitis: K21.9

## 2018-04-08 HISTORY — DX: Atherosclerotic heart disease of native coronary artery without angina pectoris: I25.10

## 2018-04-08 HISTORY — DX: Osteoarthritis of first carpometacarpal joint, unspecified: M18.9

## 2018-04-08 HISTORY — DX: Other specified diseases of anus and rectum: K62.89

## 2018-04-08 HISTORY — DX: Mixed hyperlipidemia: E78.2

## 2018-04-09 ENCOUNTER — Encounter: Payer: Self-pay | Admitting: Cardiology

## 2018-04-09 ENCOUNTER — Ambulatory Visit (INDEPENDENT_AMBULATORY_CARE_PROVIDER_SITE_OTHER): Payer: PPO | Admitting: Cardiology

## 2018-04-09 ENCOUNTER — Ambulatory Visit (HOSPITAL_BASED_OUTPATIENT_CLINIC_OR_DEPARTMENT_OTHER)
Admission: RE | Admit: 2018-04-09 | Discharge: 2018-04-09 | Disposition: A | Discharge status: Home / Self Care | Payer: PPO | Source: Ambulatory Visit | Attending: Cardiology | Admitting: Cardiology

## 2018-04-09 VITALS — BP 136/82 | HR 80 | Ht 70.5 in | Wt 176.8 lb

## 2018-04-09 DIAGNOSIS — M79646 Pain in unspecified finger(s): Secondary | ICD-10-CM

## 2018-04-09 DIAGNOSIS — M25569 Pain in unspecified knee: Secondary | ICD-10-CM | POA: Insufficient documentation

## 2018-04-09 DIAGNOSIS — I209 Angina pectoris, unspecified: Secondary | ICD-10-CM | POA: Insufficient documentation

## 2018-04-09 DIAGNOSIS — E782 Mixed hyperlipidemia: Secondary | ICD-10-CM | POA: Diagnosis not present

## 2018-04-09 DIAGNOSIS — N400 Enlarged prostate without lower urinary tract symptoms: Secondary | ICD-10-CM

## 2018-04-09 DIAGNOSIS — J431 Panlobular emphysema: Secondary | ICD-10-CM | POA: Diagnosis not present

## 2018-04-09 DIAGNOSIS — N529 Male erectile dysfunction, unspecified: Secondary | ICD-10-CM | POA: Insufficient documentation

## 2018-04-09 DIAGNOSIS — Z87891 Personal history of nicotine dependence: Secondary | ICD-10-CM

## 2018-04-09 DIAGNOSIS — M2550 Pain in unspecified joint: Secondary | ICD-10-CM

## 2018-04-09 DIAGNOSIS — L84 Corns and callosities: Secondary | ICD-10-CM | POA: Insufficient documentation

## 2018-04-09 DIAGNOSIS — M75100 Unspecified rotator cuff tear or rupture of unspecified shoulder, not specified as traumatic: Secondary | ICD-10-CM

## 2018-04-09 DIAGNOSIS — R351 Nocturia: Secondary | ICD-10-CM | POA: Insufficient documentation

## 2018-04-09 DIAGNOSIS — Q6100 Congenital renal cyst, unspecified: Secondary | ICD-10-CM | POA: Insufficient documentation

## 2018-04-09 DIAGNOSIS — G259 Extrapyramidal and movement disorder, unspecified: Secondary | ICD-10-CM

## 2018-04-09 DIAGNOSIS — Z79899 Other long term (current) drug therapy: Secondary | ICD-10-CM

## 2018-04-09 DIAGNOSIS — R197 Diarrhea, unspecified: Secondary | ICD-10-CM | POA: Insufficient documentation

## 2018-04-09 DIAGNOSIS — J449 Chronic obstructive pulmonary disease, unspecified: Secondary | ICD-10-CM | POA: Diagnosis not present

## 2018-04-09 DIAGNOSIS — F1020 Alcohol dependence, uncomplicated: Secondary | ICD-10-CM | POA: Insufficient documentation

## 2018-04-09 DIAGNOSIS — F17210 Nicotine dependence, cigarettes, uncomplicated: Secondary | ICD-10-CM | POA: Insufficient documentation

## 2018-04-09 HISTORY — DX: Diarrhea, unspecified: R19.7

## 2018-04-09 HISTORY — DX: Pain in unspecified finger(s): M79.646

## 2018-04-09 HISTORY — DX: Pain in unspecified joint: M25.50

## 2018-04-09 HISTORY — DX: Unspecified rotator cuff tear or rupture of unspecified shoulder, not specified as traumatic: M75.100

## 2018-04-09 HISTORY — DX: Other long term (current) drug therapy: Z79.899

## 2018-04-09 HISTORY — DX: Personal history of nicotine dependence: Z87.891

## 2018-04-09 HISTORY — DX: Male erectile dysfunction, unspecified: N52.9

## 2018-04-09 HISTORY — DX: Congenital renal cyst, unspecified: Q61.00

## 2018-04-09 HISTORY — DX: Corns and callosities: L84

## 2018-04-09 HISTORY — DX: Nicotine dependence, cigarettes, uncomplicated: F17.210

## 2018-04-09 HISTORY — DX: Extrapyramidal and movement disorder, unspecified: G25.9

## 2018-04-09 HISTORY — DX: Alcohol dependence, uncomplicated: F10.20

## 2018-04-09 HISTORY — DX: Pain in unspecified knee: M25.569

## 2018-04-09 HISTORY — DX: Benign prostatic hyperplasia without lower urinary tract symptoms: N40.0

## 2018-04-09 HISTORY — DX: Nocturia: R35.1

## 2018-04-09 MED ORDER — ASPIRIN EC 81 MG PO TBEC: 81.0000 mg | DELAYED_RELEASE_TABLET | Freq: Every day | ORAL | 3 refills | Status: DC

## 2018-04-09 MED ORDER — NITROGLYCERIN 0.4 MG SL SUBL: 0.4000 mg | SUBLINGUAL_TABLET | SUBLINGUAL | 11 refills | Status: DC | PRN

## 2018-04-09 NOTE — H&P (View-Only) (Signed)
Cardiology Office Note:    Date:  04/09/2018   ID:  [name] Kemps., DOB Dec 30, 1940, MRN [id number]  PCP:  Josetta Huddle, MD  Cardiologist:  Jenean Lindau, MD   Referring MD: Josetta Huddle, MD    ASSESSMENT:    1. 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 Ex-smoker   4. Mixed hyperlipidemia    PLAN:    In order of problems listed above:  1. Secondary prevention stressed with the patient.  Importance of compliance with diet and medication stressed and he vocalized understanding.  His lipids are followed by his primary care physician.  I advised him never to go back to smoking. 2. Sublingual nitroglycerin prescription was sent, its protocol and 911 protocol explained and the patient vocalized understanding questions were answered to the patient's satisfaction 3. In view of the patient's symptoms, I discussed with the patient options for evaluation. Invasive and noninvasive options were given to the patient. I discussed stress testing and coronary angiography and left heart catheterization at length. Benefits, pros and cons of each approach were discussed at length. Patient had multiple questions which were answered to the patient's satisfaction. Patient opted for invasive evaluation and we will set up for coronary angiography and left heart catheterization. Further recommendations will be made based on the findings with coronary angiography. In the interim if the patient has any significant symptoms in hospital to the nearest emergency room.   Medication Adjustments/Labs and Tests Ordered: Current medicines are reviewed at length with the patient today.  Concerns regarding medicines are outlined above.  No orders of the defined types were placed in this encounter.  No orders of the defined types were placed in this encounter.    History of Present Illness:    [name] Abshier. is a 77 y.o. male who is being seen today for the evaluation of chest  tightness on exertion suggesting angina at the request of Josetta Huddle, MD.  Patient is a pleasant 77 year old male.  He has past medical history of extensive smoking.  He quit about a year and a half ago.  Now he mentions to me that consistently on exertion he has chest tightness radiating to the left arm.  He does not give a history of coronary angiography but he tells me that he may have had one in the past.  Records indicate that he had a 50% RCA stenosis but I do not have that report.  His chest tightness is appearing consistently on exertion and he is concerned about it.  At the time of my evaluation, the patient is alert awake oriented and in no distress.  Past Medical History:  Diagnosis Date  . Alcohol abuse   . Arthritis   . BPH with elevated PSA   . COPD (chronic obstructive pulmonary disease) ([place])   . Coronary artery disease   . Dyspnea   . ED (erectile dysfunction)   . Elevated PSA   . GERD (gastroesophageal reflux disease)   . Hyperlipidemia   . Hypertension   . Kidney cysts   . Palpitations     Past Surgical History:  Procedure Laterality Date  . CARDIAC CATHETERIZATION  2002   50% RCA  . CATARACT EXTRACTION, BILATERAL    . EYE SURGERY    . KNEE ARTHROSCOPY    . MULTIPLE TOOTH EXTRACTIONS    . TONSILLECTOMY    . VIDEO BRONCHOSCOPY WITH ENDOBRONCHIAL NAVIGATION N/A 07/26/2016   Procedure: VIDEO BRONCHOSCOPY WITH  ENDOBRONCHIAL NAVIGATION;  Surgeon: Collene Gobble, MD;  Location: [facility] OR;  Service: Thoracic;  Laterality: N/A;    Current Medications: Current Meds  Medication Sig  . albuterol (PROVENTIL) (2.5 MG/3ML) 0.083% nebulizer solution Take 3 mLs (2.5 mg total) by nebulization every 6 (six) hours as needed for wheezing or shortness of breath.  . Cholecalciferol (VITAMIN D) 2000 units CAPS Take 2,000 Units by mouth daily.  Marland Kitchen esomeprazole (NEXIUM) 20 MG capsule Take 20 mg by mouth daily at 12 noon.  . famotidine (PEPCID) 20 MG tablet Take 20 mg by mouth 2 (two)  times daily.  . nitroGLYCERIN (NITROSTAT) 0.4 MG SL tablet Place 0.4 mg under the tongue every 5 (five) minutes as needed for chest pain.  Marland Kitchen umeclidinium-vilanterol (ANORO ELLIPTA) 62.5-25 MCG/INH AEPB Inhale 1 puff into the lungs daily.     Allergies:   No known allergies   Social History   Socioeconomic History  . Marital status: Married    Spouse name: Not on file  . Number of children: Not on file  . Years of education: Not on file  . Highest education level: Not on file  Occupational History  . Not on file  Social Needs  . Financial resource strain: Not on file  . Food insecurity:    Worry: Not on file    Inability: Not on file  . Transportation needs:    Medical: Not on file    Non-medical: Not on file  Tobacco Use  . Smoking status: Former Smoker    Packs/day: 0.25    Years: 40.00    Pack years: 10.00    Types: Cigarettes    Last attempt to quit: 08/28/2016    Years since quitting: 1.6  . Smokeless tobacco: Never Used  . Tobacco comment: Currently using a Vape  Substance and Sexual Activity  . Alcohol use: No    Alcohol/week: 0.0 standard drinks    Comment: quit drinking 2014  . Drug use: No  . Sexual activity: Not on file  Lifestyle  . Physical activity:    Days per week: Not on file    Minutes per session: Not on file  . Stress: Not on file  Relationships  . Social connections:    Talks on phone: Not on file    Gets together: Not on file    Attends religious service: Not on file    Active member of club or organization: Not on file    Attends meetings of clubs or organizations: Not on file    Relationship status: Not on file  Other Topics Concern  . Not on file  Social History Narrative  . Not on file     Family History: The patient's family history includes Alzheimer's disease in his mother; Hypertension in his mother.  ROS:   Please see the history of present illness.    All other systems reviewed and are negative.  EKGs/Labs/Other Studies  Reviewed:    The following studies were reviewed today: I discussed my findings with the patient at extensive length.  EKG reveals sinus rhythm and nonspecific ST changes.   Recent Labs: No results found for requested labs within last 8760 hours.  Recent Lipid Panel No results found for: CHOL, TRIG, HDL, CHOLHDL, VLDL, LDLCALC, LDLDIRECT  Physical Exam:    VS:  BP 136/82 (BP Location: Right Arm, Patient Position: Sitting, Cuff Size: Normal)   Pulse 80   Ht 5' 10.5" (1.791 m)   Wt 176 lb 12.8 oz (  80.2 kg)   SpO2 97%   BMI 25.01 kg/m     Wt Readings from Last 3 Encounters:  04/09/18 176 lb 12.8 oz (80.2 kg)  04/02/18 175 lb (79.4 kg)  09/04/17 175 lb (79.4 kg)     GEN: Patient is in no acute distress HEENT: Normal NECK: No JVD; No carotid bruits LYMPHATICS: No lymphadenopathy CARDIAC: S1 S2 regular, 2/6 systolic murmur at the apex. RESPIRATORY:  Clear to auscultation without rales, wheezing or rhonchi  ABDOMEN: Soft, non-tender, non-distended MUSCULOSKELETAL:  No edema; No deformity  SKIN: Warm and dry NEUROLOGIC:  Alert and oriented x 3 PSYCHIATRIC:  Normal affect    Signed, Jenean Lindau, MD  04/09/2018 4:27 PM    [facility]

## 2018-04-09 NOTE — Addendum Note (Signed)
Addended by: Mattie Marlin on: 04/09/2018 04:42 PM   Modules accepted: Orders

## 2018-04-09 NOTE — Progress Notes (Signed)
Cardiology Office Note:    Date:  04/09/2018   ID:  [name] Kemps., DOB [birth year], MRN [id number]  PCP:  Josetta Huddle, MD  Cardiologist:  Jenean Lindau, MD   Referring MD: Josetta Huddle, MD    ASSESSMENT:    1. 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 Ex-smoker   4. Mixed hyperlipidemia    PLAN:    In order of problems listed above:  1. Secondary prevention stressed with the patient.  Importance of compliance with diet and medication stressed and he vocalized understanding.  His lipids are followed by his primary care physician.  I advised him never to go back to smoking. 2. Sublingual nitroglycerin prescription was sent, its protocol and 911 protocol explained and the patient vocalized understanding questions were answered to the patient's satisfaction 3. In view of the patient's symptoms, I discussed with the patient options for evaluation. Invasive and noninvasive options were given to the patient. I discussed stress testing and coronary angiography and left heart catheterization at length. Benefits, pros and cons of each approach were discussed at length. Patient had multiple questions which were answered to the patient's satisfaction. Patient opted for invasive evaluation and we will set up for coronary angiography and left heart catheterization. Further recommendations will be made based on the findings with coronary angiography. In the interim if the patient has any significant symptoms in hospital to the nearest emergency room.   Medication Adjustments/Labs and Tests Ordered: Current medicines are reviewed at length with the patient today.  Concerns regarding medicines are outlined above.  No orders of the defined types were placed in this encounter.  No orders of the defined types were placed in this encounter.    History of Present Illness:    [name] Lacher. is a 77 y.o. male who is being seen today for the evaluation of chest  tightness on exertion suggesting angina at the request of Josetta Huddle, MD.  Patient is a pleasant 77 year old male.  He has past medical history of extensive smoking.  He quit about a year and a half ago.  Now he mentions to me that consistently on exertion he has chest tightness radiating to the left arm.  He does not give a history of coronary angiography but he tells me that he may have had one in the past.  Records indicate that he had a 50% RCA stenosis but I do not have that report.  His chest tightness is appearing consistently on exertion and he is concerned about it.  At the time of my evaluation, the patient is alert awake oriented and in no distress.  Past Medical History:  Diagnosis Date  . Alcohol abuse   . Arthritis   . BPH with elevated PSA   . COPD (chronic obstructive pulmonary disease) ([place])   . Coronary artery disease   . Dyspnea   . ED (erectile dysfunction)   . Elevated PSA   . GERD (gastroesophageal reflux disease)   . Hyperlipidemia   . Hypertension   . Kidney cysts   . Palpitations     Past Surgical History:  Procedure Laterality Date  . CARDIAC CATHETERIZATION  2002   50% RCA  . CATARACT EXTRACTION, BILATERAL    . EYE SURGERY    . KNEE ARTHROSCOPY    . MULTIPLE TOOTH EXTRACTIONS    . TONSILLECTOMY    . VIDEO BRONCHOSCOPY WITH ENDOBRONCHIAL NAVIGATION N/A 07/26/2016   Procedure: VIDEO BRONCHOSCOPY WITH  ENDOBRONCHIAL NAVIGATION;  Surgeon: Collene Gobble, MD;  Location: [facility] OR;  Service: Thoracic;  Laterality: N/A;    Current Medications: Current Meds  Medication Sig  . albuterol (PROVENTIL) (2.5 MG/3ML) 0.083% nebulizer solution Take 3 mLs (2.5 mg total) by nebulization every 6 (six) hours as needed for wheezing or shortness of breath.  . Cholecalciferol (VITAMIN D) 2000 units CAPS Take 2,000 Units by mouth daily.  Marland Kitchen esomeprazole (NEXIUM) 20 MG capsule Take 20 mg by mouth daily at 12 noon.  . famotidine (PEPCID) 20 MG tablet Take 20 mg by mouth 2 (two)  times daily.  . nitroGLYCERIN (NITROSTAT) 0.4 MG SL tablet Place 0.4 mg under the tongue every 5 (five) minutes as needed for chest pain.  Marland Kitchen umeclidinium-vilanterol (ANORO ELLIPTA) 62.5-25 MCG/INH AEPB Inhale 1 puff into the lungs daily.     Allergies:   No known allergies   Social History   Socioeconomic History  . Marital status: Married    Spouse name: Not on file  . Number of children: Not on file  . Years of education: Not on file  . Highest education level: Not on file  Occupational History  . Not on file  Social Needs  . Financial resource strain: Not on file  . Food insecurity:    Worry: Not on file    Inability: Not on file  . Transportation needs:    Medical: Not on file    Non-medical: Not on file  Tobacco Use  . Smoking status: Former Smoker    Packs/day: 0.25    Years: 40.00    Pack years: 10.00    Types: Cigarettes    Last attempt to quit: 08/28/2016    Years since quitting: 1.6  . Smokeless tobacco: Never Used  . Tobacco comment: Currently using a Vape  Substance and Sexual Activity  . Alcohol use: No    Alcohol/week: 0.0 standard drinks    Comment: quit drinking 2014  . Drug use: No  . Sexual activity: Not on file  Lifestyle  . Physical activity:    Days per week: Not on file    Minutes per session: Not on file  . Stress: Not on file  Relationships  . Social connections:    Talks on phone: Not on file    Gets together: Not on file    Attends religious service: Not on file    Active member of club or organization: Not on file    Attends meetings of clubs or organizations: Not on file    Relationship status: Not on file  Other Topics Concern  . Not on file  Social History Narrative  . Not on file     Family History: The patient's family history includes Alzheimer's disease in his mother; Hypertension in his mother.  ROS:   Please see the history of present illness.    All other systems reviewed and are negative.  EKGs/Labs/Other Studies  Reviewed:    The following studies were reviewed today: I discussed my findings with the patient at extensive length.  EKG reveals sinus rhythm and nonspecific ST changes.   Recent Labs: No results found for requested labs within last 8760 hours.  Recent Lipid Panel No results found for: CHOL, TRIG, HDL, CHOLHDL, VLDL, LDLCALC, LDLDIRECT  Physical Exam:    VS:  BP 136/82 (BP Location: Right Arm, Patient Position: Sitting, Cuff Size: Normal)   Pulse 80   Ht 5' 10.5" (1.791 m)   Wt 176 lb 12.8 oz (  80.2 kg)   SpO2 97%   BMI 25.01 kg/m     Wt Readings from Last 3 Encounters:  04/09/18 176 lb 12.8 oz (80.2 kg)  04/02/18 175 lb (79.4 kg)  09/04/17 175 lb (79.4 kg)     GEN: Patient is in no acute distress HEENT: Normal NECK: No JVD; No carotid bruits LYMPHATICS: No lymphadenopathy CARDIAC: S1 S2 regular, 2/6 systolic murmur at the apex. RESPIRATORY:  Clear to auscultation without rales, wheezing or rhonchi  ABDOMEN: Soft, non-tender, non-distended MUSCULOSKELETAL:  No edema; No deformity  SKIN: Warm and dry NEUROLOGIC:  Alert and oriented x 3 PSYCHIATRIC:  Normal affect    Signed, Jenean Lindau, MD  04/09/2018 4:27 PM    [facility]

## 2018-04-09 NOTE — Patient Instructions (Addendum)
Medication Instructions:  Your physician has recommended you make the following change in your medication:  START 81 mg enteric coated aspirin daily  Labwork: Your physician recommends that you have the following labs drawn: CBC and BMP today  Testing/Procedures: A chest x-ray takes a picture of the organs and structures inside the chest, including the heart, lungs, and blood vessels. This test can show several things, including, whether the heart is enlarges; whether fluid is building up in the lungs; and whether pacemaker / defibrillator leads are still in place.     [facility] [address] [place], [place] [address] Dept: [phone] Loc: [place].  04/09/2018  You are scheduled for a Cardiac Catheterization on Tuesday, September 17 with Dr. Larae Grooms.  1. Please arrive at the [facility] (Main Entrance A) at [facility]: [address] [place], [place] [address] at 7:00 AM (This time is two hours before your procedure to ensure your preparation). Free valet parking service is available.   Special note: Every effort is made to have your procedure done on time. Please understand that emergencies sometimes delay scheduled procedures.  2. Diet: Do not eat solid foods after midnight.  The patient may have clear liquids until 5am upon the day of the procedure.  3. Labs: Done today.  4. Medication instructions in preparation for your procedure:  On the morning of your procedure, take your Aspirin and any morning medicines NOT listed above.  You may use sips of water.  5. Plan for one night stay--bring personal belongings. 6. Bring a current list of your medications and current insurance cards. 7. You MUST have a responsible person to drive you home. 8. Someone MUST be with you the first 24 hours after you arrive home or your discharge will be  delayed. 9. Please wear clothes that are easy to get on and off and wear slip-on shoes.  Thank you for allowing [place] to care for you!   -- [facility]  Follow-Up: Your physician recommends that you schedule a follow-up appointment in: 1 month  Any Other Special Instructions Will Be Listed Below (If Applicable).     If you need a refill on your cardiac medications before your next appointment, please call your pharmacy.   [place], RN, BSN   Coronary Angiogram With Stent Coronary angiogram with stent placement is a procedure to widen or open a narrow blood vessel of the heart (coronary artery). Arteries may become blocked by cholesterol buildup (plaques) in the lining of the wall. When a coronary artery becomes partially blocked, blood flow to that area decreases. This may lead to chest pain or a heart attack (myocardial infarction). A stent is a small piece of metal that looks like mesh or a spring. Stent placement may be done as treatment for a heart attack or right after a coronary angiogram in which a blocked artery is found. Let your health care provider know about:  Any allergies you have.  All medicines you are taking, including vitamins, herbs, eye drops, creams, and over-the-counter medicines.  Any problems you or family members have had with anesthetic medicines.  Any blood disorders you have.  Any surgeries you have had.  Any medical conditions you have.  Whether you are pregnant or may be pregnant. What are the risks? Generally, this is a safe procedure. However, problems may  occur, including:  Damage to the heart or its blood vessels.  A return of blockage.  Bleeding, infection, or bruising at the insertion site.  A collection of blood under the skin (hematoma) at the insertion site.  A blood clot in another part of the body.  Kidney injury.  Allergic reaction to the dye or contrast that is  used.  Bleeding into the abdomen (retroperitoneal bleeding).  What happens before the procedure? Staying hydrated Follow instructions from your health care provider about hydration, which may include:  Up to 2 hours before the procedure - you may continue to drink clear liquids, such as water, clear fruit juice, black coffee, and plain tea.  Eating and drinking restrictions Follow instructions from your health care provider about eating and drinking, which may include:  8 hours before the procedure - stop eating heavy meals or foods such as meat, fried foods, or fatty foods.  6 hours before the procedure - stop eating light meals or foods, such as toast or cereal.  2 hours before the procedure - stop drinking clear liquids.  Ask your health care provider about:  Changing or stopping your regular medicines. This is especially important if you are taking diabetes medicines or blood thinners.  Taking medicines such as ibuprofen. These medicines can thin your blood. Do not take these medicines before your procedure if your health care provider instructs you not to. Generally, aspirin is recommended before a procedure of passing a small, thin tube (catheter) through a blood vessel and into the heart (cardiac catheterization).  What happens during the procedure?  An IV tube will be inserted into one of your veins.  You will be given one or more of the following: ? A medicine to help you relax (sedative). ? A medicine to numb the area where the catheter will be inserted into an artery (local anesthetic).  To reduce your risk of infection: ? Your health care team will wash or sanitize their hands. ? Your skin will be washed with soap. ? Hair may be removed from the area where the catheter will be inserted.  Using a guide wire, the catheter will be inserted into an artery. The location may be in your groin, in your wrist, or in the fold of your arm (near your elbow).  A type of X-ray  (fluoroscopy) will be used to help guide the catheter to the opening of the arteries in the heart.  A dye will be injected into the catheter, and X-rays will be taken. The dye will help to show where any narrowing or blockages are located in the arteries.  A tiny wire will be guided to the blocked spot, and a balloon will be inflated to make the artery wider.  The stent will be expanded and will crush the plaques into the wall of the vessel. The stent will hold the area open and improve the blood flow. Most stents have a drug coating to reduce the risk of the stent narrowing over time.  The artery may be made wider using a drill, laser, or other tools to remove plaques.  When the blood flow is better, the catheter will be removed. The lining of the artery will grow over the stent, which stays where it was placed. This procedure may vary among health care providers and hospitals. What happens after the procedure?  If the procedure is done through the leg, you will be kept in bed lying flat for about 6 hours. You will be instructed  to not bend and not cross your legs.  The insertion site will be checked frequently.  The pulse in your foot or wrist will be checked frequently.  You may have additional blood tests, X-rays, and a test that records the electrical activity of your heart (electrocardiogram, or ECG). This information is not intended to replace advice given to you by your health care provider. Make sure you discuss any questions you have with your health care provider. Document Released: 01/20/2003 Document Revised: 03/15/2016 Document Reviewed: 02/19/2016 Elsevier Interactive Patient Education  2018 [employer].  Aspirin and Your Heart Aspirin is a medicine that affects the way blood clots. Aspirin can be used to help reduce the risk of blood clots, heart attacks, and other heart-related problems. Should I take aspirin? Your health care provider will help you determine whether it  is safe and beneficial for you to take aspirin daily. Taking aspirin daily may be beneficial if you:  Have had a heart attack or chest pain.  Have undergone open heart surgery such as coronary artery bypass surgery (CABG).  Have had coronary angioplasty.  Have experienced a stroke or transient ischemic attack (TIA).  Have peripheral vascular disease (PVD).  Have chronic heart rhythm problems such as atrial fibrillation.  Are there any risks of taking aspirin daily? Daily use of aspirin can increase your risk of side effects. Some of these include:  Bleeding. Bleeding problems can be minor or serious. An example of a minor problem is a cut that does not stop bleeding. An example of a more serious problem is stomach bleeding or bleeding into the brain. Your risk of bleeding is increased if you are also taking non-steroidal anti-inflammatory medicine (NSAIDs).  Increased bruising.  Upset stomach.  An allergic reaction. People who have nasal polyps have an increased risk of developing an aspirin allergy.  What are some guidelines I should follow when taking aspirin?  Take aspirin only as directed by your health care provider. Make sure you understand how much you should take and what form you should take. The two forms of aspirin are: ? Non-enteric-coated. This type of aspirin does not have a coating and is absorbed quickly. Non-enteric-coated aspirin is usually recommended for people with chest pain. This type of aspirin also comes in a chewable form. ? Enteric-coated. This type of aspirin has a special coating that releases the medicine very slowly. Enteric-coated aspirin causes less stomach upset than non-enteric-coated aspirin. This type of aspirin should not be chewed or crushed.  Drink alcohol in moderation. Drinking alcohol increases your risk of bleeding. When should I seek medical care?  You have unusual bleeding or bruising.  You have stomach pain.  You have an allergic  reaction. Symptoms of an allergic reaction include: ? Hives. ? Itchy skin. ? Swelling of the lips, tongue, or face.  You have ringing in your ears. When should I seek immediate medical care?  Your bowel movements are bloody, dark red, or black in color.  You vomit or cough up blood.  You have blood in your urine.  You cough, wheeze, or feel short of breath. If you have any of the following symptoms, this is an emergency. Do not wait to see if the pain will go away. Get medical help at once. Call your local emergency services (911 in the [place] Do not drive yourself to the hospital.  You have severe chest pain, especially if the pain is crushing or pressure-like and spreads to the arms, back, neck, or  jaw.  You have stroke-like symptoms, such as: ? Loss of vision. ? Difficulty talking. ? Numbness or weakness on one side of your body. ? Numbness or weakness in your arm or leg. ? Not thinking clearly or feeling confused.  This information is not intended to replace advice given to you by your health care provider. Make sure you discuss any questions you have with your health care provider. Document Released: 06/28/2008 Document Revised: 11/23/2015 Document Reviewed: 10/21/2013 Elsevier Interactive Patient Education  2018 [employer].

## 2018-04-10 ENCOUNTER — Telehealth: Payer: Self-pay

## 2018-04-10 LAB — BASIC METABOLIC PANEL
BUN / CREAT RATIO: 16 (ref 10–24)
BUN: 18 mg/dL (ref 8–27)
CHLORIDE: 102 mmol/L (ref 96–106)
CO2: 24 mmol/L (ref 20–29)
Calcium: 9.2 mg/dL (ref 8.6–10.2)
Creatinine, Ser: 1.15 mg/dL (ref 0.76–1.27)
GFR calc Af Amer: 71 mL/min/{1.73_m2} (ref >59)
GFR calc non Af Amer: 61 mL/min/{1.73_m2} (ref >59)
GLUCOSE: 88 mg/dL (ref 65–99)
Potassium: 4.7 mmol/L (ref 3.5–5.2)
Sodium: 141 mmol/L (ref 134–144)

## 2018-04-10 LAB — CBC WITH DIFFERENTIAL/PLATELET
BASOS ABS: 0.1 10*3/uL (ref 0.0–0.2)
Basos: 1 %
EOS (ABSOLUTE): 0.2 10*3/uL (ref 0.0–0.4)
Eos: 3 %
Hematocrit: 44.6 % (ref 37.5–51.0)
Hemoglobin: 15.7 g/dL (ref 13.0–17.7)
IMMATURE GRANULOCYTES: 0 %
Immature Grans (Abs): 0 10*3/uL (ref 0.0–0.1)
Lymphocytes Absolute: 1.4 10*3/uL (ref 0.7–3.1)
Lymphs: 16 %
MCH: 30.8 pg (ref 26.6–33.0)
MCHC: 35.2 g/dL (ref 31.5–35.7)
MCV: 88 fL (ref 79–97)
Monocytes Absolute: 0.6 10*3/uL (ref 0.1–0.9)
Monocytes: 7 %
NEUTROS ABS: 6.5 10*3/uL (ref 1.4–7.0)
NEUTROS PCT: 73 %
PLATELETS: 285 10*3/uL (ref 150–450)
RBC: 5.1 x10E6/uL (ref 4.14–5.80)
RDW: 13.2 % (ref 12.3–15.4)
WBC: 8.9 10*3/uL (ref 3.4–10.8)

## 2018-04-10 NOTE — Telephone Encounter (Signed)
-----   Message from Jenean Lindau, MD sent at 04/10/2018  8:45 AM EDT ----- The results of the study is unremarkable. Please inform patient. I will discuss in detail at next appointment. Cc  primary care/referring physician Jenean Lindau, MD 04/10/2018 8:45 AM

## 2018-04-10 NOTE — Telephone Encounter (Signed)
Patient called and notified of resutls

## 2018-04-14 ENCOUNTER — Telehealth: Payer: Self-pay | Admitting: *Deleted

## 2018-04-14 DIAGNOSIS — H01002 Unspecified blepharitis right lower eyelid: Secondary | ICD-10-CM | POA: Diagnosis not present

## 2018-04-14 DIAGNOSIS — H532 Diplopia: Secondary | ICD-10-CM | POA: Diagnosis not present

## 2018-04-14 DIAGNOSIS — Z961 Presence of intraocular lens: Secondary | ICD-10-CM | POA: Diagnosis not present

## 2018-04-14 DIAGNOSIS — H01001 Unspecified blepharitis right upper eyelid: Secondary | ICD-10-CM | POA: Diagnosis not present

## 2018-04-14 NOTE — Telephone Encounter (Signed)
Pt contacted pre-catheterization scheduled at [facility] for: Tuesday September 17,2019 9 AM Verified arrival time and place: [place] Entrance A at: 7 AM  No solid food after midnight prior to cath, clear liquids until 5 AM day of procedure. Verified no known allergies  Verified no diabetes medications.  AM meds can be  taken pre-cath with sip of water including: ASA 81 mg  Confirmed patient has responsible person to drive home post procedure and for 24 hours after you arrive home:yes  Discussed instructions with patient's wife, Collie Siad [facility]), she verbalized understanding, thanked me for call.

## 2018-04-15 ENCOUNTER — Other Ambulatory Visit: Payer: Self-pay

## 2018-04-15 ENCOUNTER — Ambulatory Visit (HOSPITAL_COMMUNITY)
Admission: RE | Admit: 2018-04-15 | Discharge: 2018-04-15 | Disposition: A | Discharge status: Home / Self Care | Payer: PPO | Source: Ambulatory Visit | Attending: Interventional Cardiology | Admitting: Interventional Cardiology

## 2018-04-15 ENCOUNTER — Ambulatory Visit (HOSPITAL_COMMUNITY)
Admission: RE | Disposition: A | Discharge status: Home / Self Care | Payer: Self-pay | Source: Ambulatory Visit | Attending: Interventional Cardiology

## 2018-04-15 DIAGNOSIS — I10 Essential (primary) hypertension: Secondary | ICD-10-CM | POA: Insufficient documentation

## 2018-04-15 DIAGNOSIS — Z955 Presence of coronary angioplasty implant and graft: Secondary | ICD-10-CM | POA: Diagnosis not present

## 2018-04-15 DIAGNOSIS — Z9889 Other specified postprocedural states: Secondary | ICD-10-CM | POA: Diagnosis not present

## 2018-04-15 DIAGNOSIS — N400 Enlarged prostate without lower urinary tract symptoms: Secondary | ICD-10-CM | POA: Insufficient documentation

## 2018-04-15 DIAGNOSIS — M1990 Unspecified osteoarthritis, unspecified site: Secondary | ICD-10-CM | POA: Diagnosis not present

## 2018-04-15 DIAGNOSIS — Z9842 Cataract extraction status, left eye: Secondary | ICD-10-CM | POA: Insufficient documentation

## 2018-04-15 DIAGNOSIS — I209 Angina pectoris, unspecified: Secondary | ICD-10-CM

## 2018-04-15 DIAGNOSIS — K219 Gastro-esophageal reflux disease without esophagitis: Secondary | ICD-10-CM | POA: Diagnosis not present

## 2018-04-15 DIAGNOSIS — I25118 Atherosclerotic heart disease of native coronary artery with other forms of angina pectoris: Secondary | ICD-10-CM | POA: Insufficient documentation

## 2018-04-15 DIAGNOSIS — J431 Panlobular emphysema: Secondary | ICD-10-CM

## 2018-04-15 DIAGNOSIS — Z87891 Personal history of nicotine dependence: Secondary | ICD-10-CM | POA: Diagnosis not present

## 2018-04-15 DIAGNOSIS — N529 Male erectile dysfunction, unspecified: Secondary | ICD-10-CM | POA: Diagnosis not present

## 2018-04-15 DIAGNOSIS — J449 Chronic obstructive pulmonary disease, unspecified: Secondary | ICD-10-CM | POA: Insufficient documentation

## 2018-04-15 DIAGNOSIS — Z9841 Cataract extraction status, right eye: Secondary | ICD-10-CM | POA: Diagnosis not present

## 2018-04-15 DIAGNOSIS — Z8249 Family history of ischemic heart disease and other diseases of the circulatory system: Secondary | ICD-10-CM | POA: Diagnosis not present

## 2018-04-15 DIAGNOSIS — Z79899 Other long term (current) drug therapy: Secondary | ICD-10-CM | POA: Diagnosis not present

## 2018-04-15 DIAGNOSIS — E782 Mixed hyperlipidemia: Secondary | ICD-10-CM | POA: Diagnosis not present

## 2018-04-15 HISTORY — PX: LEFT HEART CATH AND CORONARY ANGIOGRAPHY: CATH118249

## 2018-04-15 HISTORY — PX: CORONARY STENT INTERVENTION: CATH118234

## 2018-04-15 LAB — POCT ACTIVATED CLOTTING TIME: Activated Clotting Time: 378 seconds

## 2018-04-15 SURGERY — LEFT HEART CATH AND CORONARY ANGIOGRAPHY
Anesthesia: LOCAL

## 2018-04-15 MED ORDER — IOHEXOL 350 MG/ML SOLN
INTRAVENOUS | Status: DC | PRN
  Administered 2018-04-15: 155 mL via INTRACARDIAC

## 2018-04-15 MED ORDER — SODIUM CHLORIDE 0.9% FLUSH: 3.0000 mL | Freq: Two times a day (BID) | INTRAVENOUS | Status: DC

## 2018-04-15 MED ORDER — HYDRALAZINE HCL 20 MG/ML IJ SOLN: 5.0000 mg | INTRAMUSCULAR | Status: AC | PRN

## 2018-04-15 MED ORDER — ASPIRIN EC 81 MG PO TBEC: 81.0000 mg | DELAYED_RELEASE_TABLET | Freq: Every day | ORAL | 0 refills | Status: DC

## 2018-04-15 MED ORDER — LIDOCAINE HCL (PF) 1 % IJ SOLN
INTRAMUSCULAR | Status: DC | PRN
  Administered 2018-04-15: 2 mL

## 2018-04-15 MED ORDER — CLOPIDOGREL BISULFATE 75 MG PO TABS: 75.0000 mg | ORAL_TABLET | Freq: Every day | ORAL | 0 refills | Status: DC

## 2018-04-15 MED ORDER — CLOPIDOGREL BISULFATE 300 MG PO TABS
ORAL_TABLET | ORAL | Status: AC
  Filled 2018-04-15: qty 2

## 2018-04-15 MED ORDER — TIROFIBAN (AGGRASTAT) BOLUS VIA INFUSION
INTRAVENOUS | Status: DC | PRN
  Administered 2018-04-15: 1927.5 ug via INTRAVENOUS

## 2018-04-15 MED ORDER — ASPIRIN 81 MG PO CHEW: 81.0000 mg | CHEWABLE_TABLET | ORAL | Status: DC

## 2018-04-15 MED ORDER — ROSUVASTATIN CALCIUM 20 MG PO TABS: 20.0000 mg | ORAL_TABLET | Freq: Every day | ORAL | 2 refills | Status: DC

## 2018-04-15 MED ORDER — NITROGLYCERIN 1 MG/10 ML FOR IR/CATH LAB
INTRA_ARTERIAL | Status: DC | PRN
  Administered 2018-04-15: 400 ug via INTRA_ARTERIAL
  Administered 2018-04-15 (×2): 200 ug via INTRACORONARY

## 2018-04-15 MED ORDER — SODIUM CHLORIDE 0.9 % IV SOLN: 250.0000 mL | INTRAVENOUS | Status: DC | PRN

## 2018-04-15 MED ORDER — ONDANSETRON HCL 4 MG/2ML IJ SOLN: 4.0000 mg | Freq: Four times a day (QID) | INTRAMUSCULAR | Status: DC | PRN

## 2018-04-15 MED ORDER — SODIUM CHLORIDE 0.9% FLUSH: 3.0000 mL | INTRAVENOUS | Status: DC | PRN

## 2018-04-15 MED ORDER — TIROFIBAN HCL IV 12.5 MG/250 ML
INTRAVENOUS | Status: AC
  Filled 2018-04-15: qty 250

## 2018-04-15 MED ORDER — HEPARIN (PORCINE) IN NACL 1000-0.9 UT/500ML-% IV SOLN
INTRAVENOUS | Status: DC | PRN
  Administered 2018-04-15 (×2): 500 mL

## 2018-04-15 MED ORDER — LABETALOL HCL 5 MG/ML IV SOLN
10.0000 mg | INTRAVENOUS | Status: AC | PRN
  Administered 2018-04-15: 10 mg via INTRAVENOUS
  Filled 2018-04-15: qty 4

## 2018-04-15 MED ORDER — VERAPAMIL HCL 2.5 MG/ML IV SOLN
INTRAVENOUS | Status: AC
  Filled 2018-04-15: qty 2

## 2018-04-15 MED ORDER — SODIUM CHLORIDE 0.9 % IV SOLN
INTRAVENOUS | Status: AC
  Administered 2018-04-15: 12:00:00 via INTRAVENOUS

## 2018-04-15 MED ORDER — LIDOCAINE HCL (PF) 1 % IJ SOLN
INTRAMUSCULAR | Status: AC
  Filled 2018-04-15: qty 30

## 2018-04-15 MED ORDER — CLOPIDOGREL BISULFATE 75 MG PO TABS: 75.0000 mg | ORAL_TABLET | Freq: Every day | ORAL | Status: DC

## 2018-04-15 MED ORDER — SODIUM CHLORIDE 0.9 % WEIGHT BASED INFUSION
3.0000 mL/kg/h | INTRAVENOUS | Status: AC
  Administered 2018-04-15: 3 mL/kg/h via INTRAVENOUS

## 2018-04-15 MED ORDER — FENTANYL CITRATE (PF) 100 MCG/2ML IJ SOLN
INTRAMUSCULAR | Status: AC
  Filled 2018-04-15: qty 2

## 2018-04-15 MED ORDER — ACETAMINOPHEN 325 MG PO TABS: 650.0000 mg | ORAL_TABLET | ORAL | Status: DC | PRN

## 2018-04-15 MED ORDER — ANGIOPLASTY BOOK
Freq: Once | Status: DC
  Filled 2018-04-15: qty 1

## 2018-04-15 MED ORDER — NITROGLYCERIN 1 MG/10 ML FOR IR/CATH LAB
INTRA_ARTERIAL | Status: AC
  Filled 2018-04-15: qty 10

## 2018-04-15 MED ORDER — HEPARIN SODIUM (PORCINE) 1000 UNIT/ML IJ SOLN
INTRAMUSCULAR | Status: DC | PRN
  Administered 2018-04-15: 5000 [IU] via INTRAVENOUS
  Administered 2018-04-15: 4000 [IU] via INTRAVENOUS

## 2018-04-15 MED ORDER — ASPIRIN 81 MG PO CHEW: 81.0000 mg | CHEWABLE_TABLET | Freq: Every day | ORAL | Status: DC

## 2018-04-15 MED ORDER — HEPARIN (PORCINE) IN NACL 1000-0.9 UT/500ML-% IV SOLN
INTRAVENOUS | Status: AC
  Filled 2018-04-15: qty 1000

## 2018-04-15 MED ORDER — VERAPAMIL HCL 2.5 MG/ML IV SOLN
INTRAVENOUS | Status: DC | PRN
  Administered 2018-04-15: 10 mL via INTRA_ARTERIAL

## 2018-04-15 MED ORDER — HEPARIN SODIUM (PORCINE) 1000 UNIT/ML IJ SOLN
INTRAMUSCULAR | Status: AC
  Filled 2018-04-15: qty 1

## 2018-04-15 MED ORDER — MIDAZOLAM HCL 2 MG/2ML IJ SOLN
INTRAMUSCULAR | Status: DC | PRN
  Administered 2018-04-15: 2 mg via INTRAVENOUS

## 2018-04-15 MED ORDER — FENTANYL CITRATE (PF) 100 MCG/2ML IJ SOLN
INTRAMUSCULAR | Status: DC | PRN
  Administered 2018-04-15 (×2): 25 ug via INTRAVENOUS

## 2018-04-15 MED ORDER — SODIUM CHLORIDE 0.9 % WEIGHT BASED INFUSION: 1.0000 mL/kg/h | INTRAVENOUS | Status: DC

## 2018-04-15 MED ORDER — MIDAZOLAM HCL 2 MG/2ML IJ SOLN
INTRAMUSCULAR | Status: AC
  Filled 2018-04-15: qty 2

## 2018-04-15 MED ORDER — CLOPIDOGREL BISULFATE 300 MG PO TABS
ORAL_TABLET | ORAL | Status: DC | PRN
  Administered 2018-04-15: 600 mg via ORAL

## 2018-04-15 MED FILL — CLOPIDOGREL 75 MG TABLET: 75 | 30 days supply | Qty: 30 | Fill #0

## 2018-04-15 SURGICAL SUPPLY — 18 items
BALLN SAPPHIRE 2.0X15 (BALLOONS) ×2
BALLOON SAPPHIRE 2.0X15 (BALLOONS) ×1 IMPLANT
CATH 5FR JL3.5 JR4 ANG PIG MP (CATHETERS) ×2 IMPLANT
CATH LAUNCHER 6FR EBU 3 (CATHETERS) ×2 IMPLANT
DEVICE RAD COMP TR BAND LRG (VASCULAR PRODUCTS) ×2 IMPLANT
GLIDESHEATH SLEND SS 6F .021 (SHEATH) ×2 IMPLANT
GUIDEWIRE INQWIRE 1.5J.035X260 (WIRE) ×1 IMPLANT
INQWIRE 1.5J .035X260CM (WIRE) ×2
KIT ENCORE 26 ADVANTAGE (KITS) ×2 IMPLANT
KIT HEART LEFT (KITS) ×2 IMPLANT
KIT HEMO VALVE WATCHDOG (MISCELLANEOUS) ×2 IMPLANT
PACK CARDIAC CATHETERIZATION (CUSTOM PROCEDURE TRAY) ×2 IMPLANT
STENT SYNERGY DES 2.5X24 (Permanent Stent) ×2 IMPLANT
STENT SYNERGY DES 2.75X8 (Permanent Stent) ×2 IMPLANT
TRANSDUCER W/STOPCOCK (MISCELLANEOUS) ×2 IMPLANT
TUBING CIL FLEX 10 FLL-RA (TUBING) ×2 IMPLANT
WIRE ASAHI PROWATER 180CM (WIRE) ×2 IMPLANT
WIRE HI TORQ BMW 190CM (WIRE) ×4 IMPLANT

## 2018-04-15 NOTE — Discharge Summary (Addendum)
Discharge Summary/SAME DAY PCI    Patient ID: [name] [name].,  MRN: [id number], DOB/AGE: 1941-04-25 77 y.o.  Admit date: 04/15/2018 Discharge date: 04/15/2018  Primary Care Provider: Josetta Huddle Primary Cardiologist: Dr. Geraldo Pitter  Discharge Diagnoses    Active Problems:   Angina pectoris ([place])   Allergies Allergies  Allergen Reactions  . No Known Allergies     Diagnostic Studies/Procedures    Cath: 04/15/18   Prox RCA lesion is 25% stenosed.  1st RPLB lesion is 50% stenosed. THis is a very small vessel.  Mid LAD lesion is 25% stenosed.  1st Mrg lesion is 90% stenosed.  A drug-eluting stent was successfully placed using a STENT SYNERGY DES 2.5X24.  Post intervention, there is a 0% residual stenosis.  Ost 1st Mrg lesion is 70% stenosed.  A drug-eluting stent was successfully placed using a STENT SYNERGY DES 2.75X8.  Post intervention, there is a 0% residual stenosis.  The left ventricular systolic function is normal.  LV end diastolic pressure is normal.  The left ventricular ejection fraction is 55-65% by visual estimate.  There is no aortic valve stenosis.     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   Continue aggressive secondary prevention.  _____________   History of Present Illness      77 y.o. male who was seen by Dr. Geraldo Pitter for the evaluation of chest tightness on exertion suggesting angina at the request of Josetta Huddle, MD.  He has past medical history of extensive smoking.  He quit about a year and a half ago. He reported that consistently on exertion he has chest tightness radiating to the left arm.  He did not give a history of coronary angiography but he reported that he may have had one in the past. 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  His chest tightness was appearing  consistently on exertion and he was concerned about it. Given his symptoms he was set up for outpatient cardiac cath.   Hospital Course     Underwent cardiac cath noted above with PCI/DES x2 to the 1st OM. Plan for DAPT with ASA/plavix for at least 6 months. Added high dose statin as well. Did have residual mild chest discomfort post cath, but resolved. Seen by cardiac rehab and provided education. Instructions/precautions regarding post cath site care given.   [name] was seen by Dr. Irish Lack and determined stable for discharge home. Follow up in the office has been arranged. Medications are listed below.   _____________  Discharge Vitals Blood pressure 125/60, pulse 66, temperature 97.6 F (36.4 C), temperature source Oral, resp. rate 17, height 5' 10.5" (1.791 m), weight 77.1 kg, SpO2 98 %.  Filed Weights   04/15/18 0705  Weight: 77.1 kg    Labs & Radiologic Studies    CBC No results for input(s): WBC, NEUTROABS, HGB, HCT, MCV, PLT in the last 72 hours. Basic Metabolic Panel No results for input(s): NA, K, CL, CO2, GLUCOSE, BUN, CREATININE, CALCIUM, MG, PHOS in the last 72 hours. Liver Function Tests No results for input(s): AST, ALT, ALKPHOS, BILITOT, PROT, ALBUMIN in the last 72 hours. No results for input(s): LIPASE, AMYLASE in the last 72 hours. Cardiac Enzymes No results for input(s): CKTOTAL, CKMB, CKMBINDEX, TROPONINI in the last 72 hours. BNP Invalid input(s): POCBNP D-Dimer No results for input(s): DDIMER in  the last 72 hours. Hemoglobin A1C No results for input(s): HGBA1C in the last 72 hours. Fasting Lipid Panel No results for input(s): CHOL, HDL, LDLCALC, TRIG, CHOLHDL, LDLDIRECT in the last 72 hours. Thyroid Function Tests No results for input(s): TSH, T4TOTAL, T3FREE, THYROIDAB in the last 72 hours.  Invalid input(s): FREET3 _____________  Dg Chest 2 View  Result Date: 04/10/2018 CLINICAL DATA:  Angina EXAM: CHEST - 2 VIEW COMPARISON:   07/25/2017 FINDINGS: There is hyperinflation of the lungs compatible with COPD. Heart and mediastinal contours are within normal limits. No focal opacities or effusions. No acute bony abnormality. Degenerative changes in the thoracic spine. IMPRESSION: COPD.  No active disease. Electronically Signed   By: Rolm Baptise M.D.   On: 04/10/2018 09:34   Disposition   Pt is being discharged home today in good condition.  Follow-up Plans & Appointments    Follow-up Information    Revankar, Reita Cliche, MD Follow up on 04/30/2018.   Specialty:  Cardiology Why:  at 3pm for your follow up appt.  Contact information: [address] [place] [address] [phone]          Discharge Instructions    AMB Referral to Cardiac Rehabilitation - Phase II   Complete by:  As directed    Diagnosis:  Coronary Stents   AMB Referral to Cardiac Rehabilitation - Phase II   Complete by:  As directed    Diagnosis:  Coronary Stents   Amb Referral to Cardiac Rehabilitation   Complete by:  As directed    Diagnosis:  Coronary Stents     Discharge Medications     Medication List    TAKE these medications   albuterol (2.5 MG/3ML) 0.083% nebulizer solution Commonly known as:  PROVENTIL Take 3 mLs (2.5 mg total) by nebulization every 6 (six) hours as needed for wheezing or shortness of breath.   ANORO ELLIPTA 62.5-25 MCG/INH Aepb Generic drug:  umeclidinium-vilanterol Inhale 1 puff into the lungs daily.   aspirin EC 81 MG tablet Take 1 tablet (81 mg total) by mouth daily.   clopidogrel 75 MG tablet Commonly known as:  PLAVIX Take 1 tablet (75 mg total) by mouth daily.   esomeprazole 20 MG capsule Commonly known as:  NEXIUM Take 20 mg by mouth daily at 12 noon.   nitroGLYCERIN 0.4 MG SL tablet Commonly known as:  NITROSTAT Place 1 tablet (0.4 mg total) under the tongue every 5 (five) minutes as needed for chest pain.   rosuvastatin 20 MG tablet Commonly known as:  CRESTOR Take 1  tablet (20 mg total) by mouth at bedtime.   STIOLTO RESPIMAT 2.5-2.5 MCG/ACT Aers Generic drug:  Tiotropium Bromide-Olodaterol Inhale 1 puff into the lungs daily.   Vitamin D 2000 units Caps Take 2,000 Units by mouth daily.       Acute coronary syndrome (MI, NSTEMI, STEMI, etc) this admission?: No.     Outstanding Labs/Studies   FLP/LFTs in 6 weeks.   Duration of Discharge Encounter   Greater than 30 minutes including physician time.  Signed, Reino Bellis NP-C 04/15/2018, 3:03 PM I have examined the patient and reviewed assessment and plan and discussed with patient.  Agree with above as stated.  S/p PCI of OM.  Stressed importance of DAPT.  OK for discharge.   Larae Grooms

## 2018-04-15 NOTE — Progress Notes (Signed)
Patient c/o mild discomfort in chest.  Stated that he was having it prior to the procedure and it is not worse.  Instructed patient to inform RN if new, different, or worsening chest pain occurs.

## 2018-04-15 NOTE — Discharge Instructions (Signed)
Radial Site Care °Refer to this sheet in the next few weeks. These instructions provide you with information about caring for yourself after your procedure. Your health care provider may also give you more specific instructions. Your treatment has been planned according to current medical practices, but problems sometimes occur. Call your health care provider if you have any problems or questions after your procedure. °What can I expect after the procedure? °After your procedure, it is typical to have the following: °· Bruising at the radial site that usually fades within 1-2 weeks. °· Blood collecting in the tissue (hematoma) that may be painful to the touch. It should usually decrease in size and tenderness within 1-2 weeks. ° °Follow these instructions at home: °· Take medicines only as directed by your health care provider. °· You may shower 24-48 hours after the procedure or as directed by your health care provider. Remove the bandage (dressing) and gently wash the site with plain soap and water. Pat the area dry with a clean towel. Do not rub the site, because this may cause bleeding. °· Do not take baths, swim, or use a hot tub until your health care provider approves. °· Check your insertion site every day for redness, swelling, or drainage. °· Do not apply powder or lotion to the site. °· Do not flex or bend the affected arm for 24 hours or as directed by your health care provider. °· Do not push or pull heavy objects with the affected arm for 24 hours or as directed by your health care provider. °· Do not lift over 10 lb (4.5 kg) for 5 days after your procedure or as directed by your health care provider. °· Ask your health care provider when it is okay to: °? Return to work or school. °? Resume usual physical activities or sports. °? Resume sexual activity. °· Do not drive home if you are discharged the same day as the procedure. Have someone else drive you. °· You may drive 24 hours after the procedure  unless otherwise instructed by your health care provider. °· Do not operate machinery or power tools for 24 hours after the procedure. °· If your procedure was done as an outpatient procedure, which means that you went home the same day as your procedure, a responsible adult should be with you for the first 24 hours after you arrive home. °· Keep all follow-up visits as directed by your health care provider. This is important. °Contact a health care provider if: °· You have a fever. °· You have chills. °· You have increased bleeding from the radial site. Hold pressure on the site. °Get help right away if: °· You have unusual pain at the radial site. °· You have redness, warmth, or swelling at the radial site. °· You have drainage (other than a small amount of blood on the dressing) from the radial site. °· The radial site is bleeding, and the bleeding does not stop after 30 minutes of holding steady pressure on the site. °· Your arm or hand becomes pale, cool, tingly, or numb. °This information is not intended to replace advice given to you by your health care provider. Make sure you discuss any questions you have with your health care provider. °Document Released: 08/18/2010 Document Revised: 12/22/2015 Document Reviewed: 02/01/2014 °Elsevier Interactive Patient Education © 2018 [employer]vier Inc[employer] ° ° ° °Moderate Conscious Sedation, Adult, Care After °These instructions provide you with information about caring for yourself after your procedure. Your health care provider   may also give you more specific instructions. Your treatment has been planned according to current medical practices, but problems sometimes occur. Call your health care provider if you have any problems or questions after your procedure. °What can I expect after the procedure? °After your procedure, it is common: °· To feel sleepy for several hours. °· To feel clumsy and have poor balance for several hours. °· To have poor judgment for several  hours. °· To vomit if you eat too soon. ° °Follow these instructions at home: °For at least 24 hours after the procedure: ° °· Do not: °? Participate in activities where you could fall or become injured. °? Drive. °? Use heavy machinery. °? Drink alcohol. °? Take sleeping pills or medicines that cause drowsiness. °? Make important decisions or sign legal documents. °? Take care of children on your own. °· Rest. °Eating and drinking °· Follow the diet recommended by your health care provider. °· If you vomit: °? Drink water, juice, or soup when you can drink without vomiting. °? Make sure you have little or no nausea before eating solid foods. °General instructions °· Have a responsible adult stay with you until you are awake and alert. °· Take over-the-counter and prescription medicines only as told by your health care provider. °· If you smoke, do not smoke without supervision. °· Keep all follow-up visits as told by your health care provider. This is important. °Contact a health care provider if: °· You keep feeling nauseous or you keep vomiting. °· You feel light-headed. °· You develop a rash. °· You have a fever. °Get help right away if: °· You have trouble breathing. °This information is not intended to replace advice given to you by your health care provider. Make sure you discuss any questions you have with your health care provider. °Document Released: 05/06/2013 Document Revised: 12/19/2015 Document Reviewed: 11/05/2015 °Elsevier Interactive Patient Education © 2018 Elsevier Inc. ° °

## 2018-04-15 NOTE — Progress Notes (Signed)
[id number] Stressed importance of plavix with stent. Reviewed NTG use, heart healthy food choices, CRP 2 and encouraged not to vape. Pt stated he only vapes once a day at the most and not willing to give it up right now. Gave pt CRP 2 and Pulmonary Rehab brochures. Pt knows that with a stent CRP 2 is needed prior to pulmonary. Pt stated with his COPD that he may not be able to do CRP 2. Will refer to [facility] and pt can discuss with MD if needed. Graylon Good RN BSN 04/15/2018 1:54 PM

## 2018-04-15 NOTE — Interval H&P Note (Signed)
Cath Lab Visit (complete for each Cath Lab visit)  Clinical Evaluation Leading to the Procedure:   ACS: No  Non-ACS:    Anginal Classification: CCS III  Anti-ischemic medical therapy: Minimal Therapy (1 class of medications)  Non-Invasive Test Results: No non-invasive testing performed  Prior CABG: No previous CABG  Prior cath with moderate disease    History and Physical Interval Note:  04/15/2018 10:04 AM  [name]  has presented today for surgery, with the diagnosis of [place]  The various methods of treatment have been discussed with the patient and family. After consideration of risks, benefits and other options for treatment, the patient has consented to  Procedure(s): LEFT HEART CATH AND CORONARY ANGIOGRAPHY (N/A) as a surgical intervention .  The patient's history has been reviewed, patient examined, no change in status, stable for surgery.  I have reviewed the patient's chart and labs.  Questions were answered to the patient's satisfaction.     Larae Grooms

## 2018-04-16 ENCOUNTER — Encounter (HOSPITAL_COMMUNITY): Payer: Self-pay | Admitting: Interventional Cardiology

## 2018-04-16 ENCOUNTER — Telehealth (HOSPITAL_COMMUNITY): Payer: Self-pay

## 2018-04-16 MED FILL — Tirofiban HCl in NaCl 0.9% IV Soln 12.5 MG/250ML (Base Eq): INTRAVENOUS | Qty: 250 | Status: AC

## 2018-04-16 NOTE — Telephone Encounter (Signed)
Pt insurance is active and benefits verified through [facility]. Co-pay $15.00, DED $0.00/$0.00 met, out of pocket $3,400.00/$96.73 met, co-insurance 0%. No pre-authorization. Lesia/[facility], 04/16/18 @ 2:09PM, MYT#[id number]  Will contact patient to see if he is interested in the Cardiac Rehab Program. If interested, patient will need to complete follow up appt. Once completed, patient will be contacted for scheduling upon review by the RN Navigator.

## 2018-04-16 NOTE — Telephone Encounter (Signed)
Called patient to see if he is interested in the Cardiac Rehab Program. Patient stated not at this time, the days of rehab are the days he plays golf. And does not want to "miss that".  Closed referral

## 2018-04-30 ENCOUNTER — Encounter: Payer: Self-pay | Admitting: Cardiology

## 2018-04-30 ENCOUNTER — Ambulatory Visit (INDEPENDENT_AMBULATORY_CARE_PROVIDER_SITE_OTHER): Payer: PPO | Admitting: Cardiology

## 2018-04-30 VITALS — BP 126/72 | HR 86 | Ht 70.5 in | Wt 175.0 lb

## 2018-04-30 DIAGNOSIS — I10 Essential (primary) hypertension: Secondary | ICD-10-CM

## 2018-04-30 DIAGNOSIS — I2510 Atherosclerotic heart disease of native coronary artery without angina pectoris: Secondary | ICD-10-CM | POA: Diagnosis not present

## 2018-04-30 DIAGNOSIS — E782 Mixed hyperlipidemia: Secondary | ICD-10-CM | POA: Diagnosis not present

## 2018-04-30 NOTE — Progress Notes (Signed)
Cardiology Office Note:    Date:  04/30/2018   ID:  [name] Kemps., DOB 06/29/1941, MRN [id number]  PCP:  Josetta Huddle, MD  Cardiologist:  Jenean Lindau, MD   Referring MD: Josetta Huddle, MD    ASSESSMENT:    1. Atherosclerosis of native coronary artery of native heart without angina pectoris   2. Essential (primary) hypertension   3. Mixed hyperlipidemia    PLAN:    In order of problems listed above:  1. Secondary prevention stressed with the patient.  Importance of compliance with diet and medication stressed and he vocalized understanding.  His blood pressure is stable.  Diet was discussed for dyslipidemia. 2. He will be back in 1 month for liver lipid check.  Exercise protocol was outlined to him at extensive length.  Nitroglycerin protocol was explained too. 3. Patient will be seen in follow-up appointment in 6 months or earlier if the patient has any concerns    Medication Adjustments/Labs and Tests Ordered: Current medicines are reviewed at length with the patient today.  Concerns regarding medicines are outlined above.  No orders of the defined types were placed in this encounter.  No orders of the defined types were placed in this encounter.    No chief complaint on file.    History of Present Illness:    [name] Kervin. is a 77 y.o. male patient was evaluated by me for angina pectoris and underwent coronary stenting.  Subsequently he has done fine.  And he walks on a regular basis.  No chest pain orthopnea or PND.  At the time of my evaluation, the patient is alert awake oriented and in no distress.  Past Medical History:  Diagnosis Date  . Alcohol abuse   . Arthritis   . BPH with elevated PSA   . COPD (chronic obstructive pulmonary disease) ([place])   . Coronary artery disease   . Dyspnea   . ED (erectile dysfunction)   . Elevated PSA   . GERD (gastroesophageal reflux disease)   . Hyperlipidemia   . Hypertension   . Kidney cysts   .  Palpitations     Past Surgical History:  Procedure Laterality Date  . CARDIAC CATHETERIZATION  2002   50% RCA  . CATARACT EXTRACTION, BILATERAL    . CORONARY STENT INTERVENTION N/A 04/15/2018   Procedure: CORONARY STENT INTERVENTION;  Surgeon: Jettie Booze, MD;  Location: [facility];  Service: Cardiovascular;  Laterality: N/A;  om1  . EYE SURGERY    . KNEE ARTHROSCOPY    . LEFT HEART CATH AND CORONARY ANGIOGRAPHY N/A 04/15/2018   Procedure: LEFT HEART CATH AND CORONARY ANGIOGRAPHY;  Surgeon: Jettie Booze, MD;  Location: [facility];  Service: Cardiovascular;  Laterality: N/A;  . MULTIPLE TOOTH EXTRACTIONS    . TONSILLECTOMY    . VIDEO BRONCHOSCOPY WITH ENDOBRONCHIAL NAVIGATION N/A 07/26/2016   Procedure: VIDEO BRONCHOSCOPY WITH ENDOBRONCHIAL NAVIGATION;  Surgeon: Collene Gobble, MD;  Location: [facility];  Service: Thoracic;  Laterality: N/A;    Current Medications: Current Meds  Medication Sig  . albuterol (PROVENTIL) (2.5 MG/3ML) 0.083% nebulizer solution Take 3 mLs (2.5 mg total) by nebulization every 6 (six) hours as needed for wheezing or shortness of breath.  Marland Kitchen aspirin EC 81 MG tablet Take 1 tablet (81 mg total) by mouth daily.  . Cholecalciferol (VITAMIN D) 2000 units CAPS Take 2,000 Units by mouth daily.  . clopidogrel (PLAVIX) 75 MG tablet Take 1  tablet (75 mg total) by mouth daily.  Marland Kitchen esomeprazole (NEXIUM) 20 MG capsule Take 20 mg by mouth daily at 12 noon.  . nitroGLYCERIN (NITROSTAT) 0.4 MG SL tablet Place 1 tablet (0.4 mg total) under the tongue every 5 (five) minutes as needed for chest pain.  . rosuvastatin (CRESTOR) 20 MG tablet Take 1 tablet (20 mg total) by mouth at bedtime.  . Tiotropium Bromide-Olodaterol (STIOLTO RESPIMAT) 2.5-2.5 MCG/ACT AERS Inhale 1 puff into the lungs daily.   Marland Kitchen umeclidinium-vilanterol (ANORO ELLIPTA) 62.5-25 MCG/INH AEPB Inhale 1 puff into the lungs daily.     Allergies:   No known allergies   Social History    Socioeconomic History  . Marital status: Married    Spouse name: Not on file  . Number of children: Not on file  . Years of education: Not on file  . Highest education level: Not on file  Occupational History  . Not on file  Social Needs  . Financial resource strain: Not on file  . Food insecurity:    Worry: Not on file    Inability: Not on file  . Transportation needs:    Medical: Not on file    Non-medical: Not on file  Tobacco Use  . Smoking status: Former Smoker    Packs/day: 0.25    Years: 40.00    Pack years: 10.00    Types: Cigarettes    Last attempt to quit: 08/28/2016    Years since quitting: 1.6  . Smokeless tobacco: Never Used  . Tobacco comment: Currently using a Vape  Substance and Sexual Activity  . Alcohol use: No    Alcohol/week: 0.0 standard drinks    Comment: quit drinking 2014  . Drug use: No  . Sexual activity: Not on file  Lifestyle  . Physical activity:    Days per week: Not on file    Minutes per session: Not on file  . Stress: Not on file  Relationships  . Social connections:    Talks on phone: Not on file    Gets together: Not on file    Attends religious service: Not on file    Active member of club or organization: Not on file    Attends meetings of clubs or organizations: Not on file    Relationship status: Not on file  Other Topics Concern  . Not on file  Social History Narrative  . Not on file     Family History: The patient's family history includes Alzheimer's disease in his mother; Hypertension in his mother.  ROS:   Please see the history of present illness.    All other systems reviewed and are negative.  EKGs/Labs/Other Studies Reviewed:    The following studies were reviewed today: 9/17 2019 CORONARY STENT INTERVENTION  LEFT HEART CATH AND CORONARY ANGIOGRAPHY  Conclusion     Prox RCA lesion is 25% stenosed.  1st RPLB lesion is 50% stenosed. THis is a very small vessel.  Mid LAD lesion is 25%  stenosed.  1st Mrg lesion is 90% stenosed.  A drug-eluting stent was successfully placed using a STENT SYNERGY DES 2.5X24.  Post intervention, there is a 0% residual stenosis.  Ost 1st Mrg lesion is 70% stenosed.  A drug-eluting stent was successfully placed using a STENT SYNERGY DES 2.75X8.  Post intervention, there is a 0% residual stenosis.  The left ventricular systolic function is normal.  LV end diastolic pressure is normal.  The left ventricular ejection fraction is 55-65% by visual estimate.  There is no aortic valve stenosis.     Recent Labs: 04/09/2018: BUN 18; Creatinine, Ser 1.15; Hemoglobin 15.7; Platelets 285; Potassium 4.7; Sodium 141  Recent Lipid Panel No results found for: CHOL, TRIG, HDL, CHOLHDL, VLDL, LDLCALC, LDLDIRECT  Physical Exam:    VS:  BP 126/72 (BP Location: Right Arm, Patient Position: Sitting, Cuff Size: Normal)   Pulse 86   Ht 5' 10.5" (1.791 m)   Wt 175 lb (79.4 kg)   SpO2 95%   BMI 24.75 kg/m     Wt Readings from Last 3 Encounters:  04/30/18 175 lb (79.4 kg)  04/15/18 170 lb (77.1 kg)  04/09/18 176 lb 12.8 oz (80.2 kg)     GEN: Patient is in no acute distress HEENT: Normal NECK: No JVD; No carotid bruits LYMPHATICS: No lymphadenopathy CARDIAC: Hear sounds regular, 2/6 systolic murmur at the apex. RESPIRATORY:  Clear to auscultation without rales, wheezing or rhonchi  ABDOMEN: Soft, non-tender, non-distended MUSCULOSKELETAL:  No edema; No deformity  SKIN: Warm and dry NEUROLOGIC:  Alert and oriented x 3 PSYCHIATRIC:  Normal affect   Signed, Jenean Lindau, MD  04/30/2018 3:32 PM    [facility]

## 2018-04-30 NOTE — Patient Instructions (Signed)
Medication Instructions:  Your physician recommends that you continue on your current medications as directed. Please refer to the Current Medication list given to you today.  Labwork: Your physician recommends that you have the following labs drawn: Please return to the office in 1 month fasting for BMP, liver and lipid panel. No appointment is needed for labs.  Testing/Procedures: None  Follow-Up: Your physician recommends that you schedule a follow-up appointment in: 6 months  Any Other Special Instructions Will Be Listed Below (If Applicable).     If you need a refill on your cardiac medications before your next appointment, please call your pharmacy.   [place], RN, BSN

## 2018-04-30 NOTE — Addendum Note (Signed)
Addended by: Mattie Marlin on: 04/30/2018 03:41 PM   Modules accepted: Orders

## 2018-05-01 DIAGNOSIS — R972 Elevated prostate specific antigen [PSA]: Secondary | ICD-10-CM | POA: Diagnosis not present

## 2018-05-01 DIAGNOSIS — G479 Sleep disorder, unspecified: Secondary | ICD-10-CM | POA: Diagnosis not present

## 2018-05-01 DIAGNOSIS — I2510 Atherosclerotic heart disease of native coronary artery without angina pectoris: Secondary | ICD-10-CM | POA: Diagnosis not present

## 2018-05-01 DIAGNOSIS — K219 Gastro-esophageal reflux disease without esophagitis: Secondary | ICD-10-CM | POA: Diagnosis not present

## 2018-05-01 DIAGNOSIS — I739 Peripheral vascular disease, unspecified: Secondary | ICD-10-CM | POA: Diagnosis not present

## 2018-05-01 DIAGNOSIS — Z86010 Personal history of colonic polyps: Secondary | ICD-10-CM | POA: Diagnosis not present

## 2018-05-01 DIAGNOSIS — Z0000 Encounter for general adult medical examination without abnormal findings: Secondary | ICD-10-CM | POA: Diagnosis not present

## 2018-05-01 DIAGNOSIS — J439 Emphysema, unspecified: Secondary | ICD-10-CM | POA: Diagnosis not present

## 2018-05-01 DIAGNOSIS — I10 Essential (primary) hypertension: Secondary | ICD-10-CM | POA: Diagnosis not present

## 2018-05-01 DIAGNOSIS — N401 Enlarged prostate with lower urinary tract symptoms: Secondary | ICD-10-CM | POA: Diagnosis not present

## 2018-05-02 ENCOUNTER — Encounter: Payer: Self-pay | Admitting: Emergency Medicine

## 2018-05-02 ENCOUNTER — Ambulatory Visit (INDEPENDENT_AMBULATORY_CARE_PROVIDER_SITE_OTHER): Payer: PPO | Admitting: Emergency Medicine

## 2018-05-02 DIAGNOSIS — J449 Chronic obstructive pulmonary disease, unspecified: Secondary | ICD-10-CM | POA: Diagnosis not present

## 2018-05-02 DIAGNOSIS — R918 Other nonspecific abnormal finding of lung field: Secondary | ICD-10-CM

## 2018-05-02 DIAGNOSIS — I2510 Atherosclerotic heart disease of native coronary artery without angina pectoris: Secondary | ICD-10-CM

## 2018-05-02 DIAGNOSIS — J431 Panlobular emphysema: Secondary | ICD-10-CM | POA: Diagnosis not present

## 2018-05-02 LAB — PULMONARY FUNCTION TEST
DL/VA % pred: 45 %
DL/VA: 2 ml/min/mmHg/L
DLCO UNC % PRED: 38 %
DLCO cor % pred: 37 %
DLCO cor: 10.72 ml/min/mmHg
DLCO unc: 11.04 ml/min/mmHg
FEF 25-75 Post: 0.43 L/sec
FEF 25-75 Pre: 0.37 L/sec
FEF2575-%Change-Post: 16 %
FEF2575-%Pred-Post: 23 %
FEF2575-%Pred-Pre: 19 %
FEV1-%Change-Post: 9 %
FEV1-%Pred-Post: 38 %
FEV1-%Pred-Pre: 35 %
FEV1-POST: 1.02 L
FEV1-PRE: 0.92 L
FEV1FVC-%Change-Post: 9 %
FEV1FVC-%Pred-Pre: 51 %
FEV6-%CHANGE-POST: 5 %
FEV6-%PRED-PRE: 66 %
FEV6-%Pred-Post: 70 %
FEV6-POST: 2.41 L
FEV6-Pre: 2.28 L
FEV6FVC-%Change-Post: 5 %
FEV6FVC-%PRED-POST: 102 %
FEV6FVC-%Pred-Pre: 97 %
FVC-%CHANGE-POST: 0 %
FVC-%PRED-PRE: 68 %
FVC-%Pred-Post: 68 %
FVC-POST: 2.52 L
FVC-PRE: 2.5 L
POST FEV6/FVC RATIO: 96 %
Post FEV1/FVC ratio: 40 %
Pre FEV1/FVC ratio: 37 %
Pre FEV6/FVC Ratio: 91 %
RV % pred: 213 %
RV: 5.2 L
TLC % PRED: 131 %
TLC: 8.5 L

## 2018-05-02 MED ORDER — ALBUTEROL SULFATE HFA 108 (90 BASE) MCG/ACT IN AERS: 2.0000 | INHALATION_SPRAY | Freq: Four times a day (QID) | RESPIRATORY_TRACT | 6 refills | Status: DC | PRN

## 2018-05-02 MED ORDER — GLYCOPYRROLATE-FORMOTEROL 9-4.8 MCG/ACT IN AERO: 2.0000 | INHALATION_SPRAY | Freq: Two times a day (BID) | RESPIRATORY_TRACT | 11 refills | Status: DC

## 2018-05-02 NOTE — Assessment & Plan Note (Signed)
Status post recent PTCI.  Hopefully this will also help his exertional dyspnea, functional capacity.

## 2018-05-02 NOTE — Assessment & Plan Note (Signed)
He felt that he benefited from the change to [facility] but notes that this is not going to be covered by his current insurance company.  He also notes that he starts to become more distant, the medication loses its effect later in the day.  I wonder whether starting him on the Bevespi might be more beneficial since it is twice a day.  If so then it may be covered by his current insurance company.  If in the end he prefers the Stiolto then he may decide to change to [facility] to ensure adequate coverage.  We will try changing to Bevespi to see if you get more benefit. Use 2 puffs twice a day.  Let us know if you benefit.  If so we will stay on this medication.  If you prefer the Stiolto then we will work on getting this medication for you either by petitioning your current insurance company or after you change to [place]. Keep your albuterol available to use either 2 puffs or one nebulizer treatment up to every 4 hours if needed for shortness of breath.  We will write you a prescription for Ventolin to see if it is covered by her insurance since you prefer this to your pro-air. Flu shot up-to-date Follow with Dr Lamonte Sakai in 3 months or sooner if you have any problems.

## 2018-05-02 NOTE — Addendum Note (Signed)
Addended by: Lorretta Harp on: 05/02/2018 03:48 PM   Modules accepted: Orders

## 2018-05-02 NOTE — Assessment & Plan Note (Signed)
Due for repeat CT scan in December 2019

## 2018-05-02 NOTE — Patient Instructions (Addendum)
We will try changing to Bevespi to see if you get more benefit. Use 2 puffs twice a day.  Let us know if you benefit.  If so we will stay on this medication.  If you prefer the Stiolto then we will work on getting this medication for you either by petitioning your current insurance company or after you change to [place]. Keep your albuterol available to use either 2 puffs or one nebulizer treatment up to every 4 hours if needed for shortness of breath.  We will write you a prescription for Ventolin to see if it is covered by her insurance since you prefer this to your pro-air. Follow with Dr Lamonte Sakai in 3 months or sooner if you have any problems.

## 2018-05-02 NOTE — Progress Notes (Signed)
PFT completed today. 05/02/18

## 2018-05-02 NOTE — Progress Notes (Signed)
Subjective:    Patient ID: [name] Kemps., male    DOB: 1940/09/26, 77 y.o.   MRN: [id number]  COPD  He complains of shortness of breath. There is no cough or wheezing. Pertinent negatives include no ear pain, fever, headaches, postnasal drip, rhinorrhea, sneezing, sore throat or trouble swallowing. His past medical history is significant for COPD.   77 yo man with hx tobacco use (40 pack years), hx CAD, HTN, hyperlipidemia, COPD diagnosed in . Currently managed w samples of either Anoro or Breo, Incruse. He has been Participating in the low-dose CT scan lung cancer screening program since October 2016. He had a repeat scan 06/02/16 that I personally reviewed. This scan identified new nodules compared with one year prior. There was an 11.7 mm left upper lobe pulmonary nodule, irregular 14.9 mm per segmental right lower lobe nodule and 13.1 mm posterior right upper lobe nodule with some central clearing. He is evaluated today for evaluation of the nodules. He tells me that he had a URI vs AE-COPD in September - this had cleared by the time the scan was done.   He has been trying to quit smoking - has been on chantix x 30 days.  No weight loss, decreased cough occas prod at night, no hemoptysis.   ROV 07/12/16 -- this follow-up visit for history of tobacco use and COPD as well as new pulmonary nodules identified on low-dose CT scan screening. His CT scan from 06/02/1710.7 mm left upper lobe nodule, irregular 14.9 mm segmental right lower lobe nodule. A stone this we arrange for a PET scan that was done on 06/18/16. He returns today to review the results. I have reviewed the scan myself. This shows hypermetabolism of the dominant right lower lobe nodule L as some hypermetabolism in the 2 Center meter posterior right upper lobe nodule, 0.7 cm right upper lobe nodule, 1.1 cm left upper lobe nodule. No evidence of distal disease. He is currently on Breo, rotates with Incruse and Anoro.   ROV 08/20/16 --  This follow-up visit for history of COPD and an abnormal CT scan of the chest with hypermetabolic pulmonary nodular disease. He underwent bronchoscopy on 07/26/16 with navigational biopsies that were negative for malignancy. He is currently on Anoro and tolerates well.   ROV 01/23/17 -- Patient has a history of COPD for which she's been treated with Breo, rotates Anoro and incruse, currently using Anoro only. Performed a navigational bronchoscopy on 07/26/16 to evaluate hypermetabolic pulmonary nodular disease. The biopsies were negative. He underwent a repeat CT scan of the chest on 01/14/17 that I have personally reviewed. This shows that his multiple pulmonary nodules including a dominant 18 x 12 mm right lower lobe nodule have not changed compared with his prior study. He has been having trouble with some R LE claudication. He uses proAir when it it is hot / humid while golfing.   ROV 09/04/17 --this is a follow-up visit for 77 year old gentleman with a history of COPD.  He also has pulmonary nodules with a dominant right lower lobe nodule.  We performed a navigational bronchoscopy that was negative for malignancy back in 07/26/16.  We have been following with serial CT scans.  Most recent scan was 07/25/17 which I have reviewed.  This shows no significant change in his pulmonary nodules dating back to 07/17/16.  He reports that he sometimes has dyspnea on the golf course. He reports that he tried some Trelegy in December that he got as sample -  he felt that he did better on this than on Anoro. He is back to Anoro right now, since he is on insurance right now.  Difficult concern is samples to keep available to allow him to get through the donut hole when it occurs.  ROV 04/02/18 --77 year old man with COPD and pulmonary nodules, dominant right lower lobe nodule that was negative for malignancy by navigational bronchoscopy 07/26/2016, next CT due in 06/2018.  He reports today that he has noticed some increased  dyspnea, most recently he had increased SOB while at altitude in [place]. He had SOB with walking short distance, also notes L arm pain with exertion. He has never had the arm or CP at sea level. He is avoiding walking when he golfs for the last year. He is currently on Anoro, is not sure that it is doing anything.  Her has tried Trelegy, felt that it was better, but he has saved samples to have available during the donut hole.   ROV 05/02/18 --follow-up visit for 77 year old gentleman with COPD and pulmonary nodular disease.  This was investigated by navigational bronchoscopy 07/26/2016 and was benign, next CT scan due in December 2019.  At our last visit he described progressive exertional dyspnea.  I tried changing him from Anoro to [employer] to see if he would get more benefit.  We performed pulmonary function testing that I have reviewed.  This shows very severe obstruction with an FEV1 of 0.92 L (35% predicted), no bronchodilator response, hyperinflated lung volumes, significantly decreased diffusion capacity.  I also had him see cardiology and he underwent a catheterization with Dr. Irish Lack on 9/17, required stent placement in his first marginal and his ostial first marginal. Difficult to tell whether the stents helped his breathing. He does feel that the Stiolto is benefiting him. He did not desaturate on ambulation last visit. He is golfing several times a week.                                                         Review of Systems  Constitutional: Negative for fever and unexpected weight change.  HENT: Negative for congestion, dental problem, ear pain, nosebleeds, postnasal drip, rhinorrhea, sinus pressure, sneezing, sore throat and trouble swallowing.   Eyes: Negative for redness and itching.  Respiratory: Positive for shortness of breath. Negative for cough, chest tightness and wheezing.   Cardiovascular: Negative for palpitations and leg swelling.  Gastrointestinal: Negative for nausea  and vomiting.  Genitourinary: Negative for dysuria.  Musculoskeletal: Negative for joint swelling.  Skin: Negative for rash.  Neurological: Negative for headaches.  Hematological: Does not bruise/bleed easily.  Psychiatric/Behavioral: Negative for dysphoric mood. The patient is not nervous/anxious.        Objective:   Physical Exam Vitals:   05/02/18 1451  BP: 100/60  Pulse: 89  SpO2: 94%  Weight: 173 lb (78.5 kg)  Height: 5' 7.13" (1.705 m)   Gen: Pleasant, well-nourished, in no distress,  normal affect  ENT: No lesions,  mouth clear,  oropharynx clear, no postnasal drip  Neck: No JVD, no stridor  Lungs: No use of accessory muscles, distant,  no crackles, no wheeze  Cardiovascular: RRR, heart sounds normal, no murmur or gallops, no peripheral edema  Musculoskeletal: No deformities, no cyanosis or clubbing  Neuro: alert, non focal  Skin:  Warm, no lesions or rashes      Assessment & Plan:  COPD (chronic obstructive pulmonary disease) ([place]) He felt that he benefited from the change to [facility] but notes that this is not going to be covered by his current insurance company.  He also notes that he starts to become more distant, the medication loses its effect later in the day.  I wonder whether starting him on the Bevespi might be more beneficial since it is twice a day.  If so then it may be covered by his current insurance company.  If in the end he prefers the Stiolto then he may decide to change to [facility] to ensure adequate coverage.  We will try changing to Beves[employer] to see if you get more benefit. Use 2 puffs twice a day.  Let us know if you benefit.  If so we will stay on this medication.  If you prefer the Stiolto then we will work on getting this medication for you either by petitioning your current insurance company or after you change to [place]. Keep your albuterol available to use either 2 puffs or one nebulizer treatment up to every 4 hours if needed for shortness of  breath.  We will write you a prescription for Ventolin to see if it is covered by her insurance since you prefer this to your pro-air. Flu shot up-to-date Follow with Dr Lamonte Sakai in 3 months or sooner if you have any problems.  Pulmonary nodules Due for repeat CT scan in December 2019  Atherosclerotic heart disease of native coronary artery without angina pectoris Status post recent PTCI.  Hopefully this will also help his exertional dyspnea, functional capacity.  Baltazar Apo, MD, PhD 05/02/2018, 3:22 PM [facility] [phone] or if no answer [phone]

## 2018-05-07 DIAGNOSIS — I2510 Atherosclerotic heart disease of native coronary artery without angina pectoris: Secondary | ICD-10-CM | POA: Insufficient documentation

## 2018-05-07 DIAGNOSIS — S01302D Unspecified open wound of left ear, subsequent encounter: Secondary | ICD-10-CM | POA: Diagnosis not present

## 2018-05-07 DIAGNOSIS — L309 Dermatitis, unspecified: Secondary | ICD-10-CM | POA: Diagnosis not present

## 2018-05-07 HISTORY — DX: Atherosclerotic heart disease of native coronary artery without angina pectoris: I25.10

## 2018-05-08 ENCOUNTER — Other Ambulatory Visit: Payer: Self-pay | Admitting: Interventional Cardiology

## 2018-05-08 ENCOUNTER — Telehealth: Payer: Self-pay | Admitting: Emergency Medicine

## 2018-05-08 NOTE — Telephone Encounter (Signed)
ATC patient LMTCB x1

## 2018-05-09 NOTE — Telephone Encounter (Signed)
Called and spoke with pt who stated the [employer] which RB had wanted to switch pt to instead of the stiolto was not covered by [employer].  Pt stated it was too expensive with his insurance. Pt stated he has been using the Stiolto due to having a sample that was given to him at the [place]. Pt states the Stiolto has been helping him but states the Stiolto will no longer be covered next year by insurance.  Per pt, insurance said if RB filled out a form in regards to the [place], they could add it to pt's formulary as a med they could cover.  Stated to pt if he could get the form from insurance, we could have RB fill it out for him. Pt expressed understanding. Nothing further needed.

## 2018-05-14 ENCOUNTER — Other Ambulatory Visit: Payer: Self-pay

## 2018-05-14 ENCOUNTER — Telehealth: Payer: Self-pay | Admitting: Cardiology

## 2018-05-14 MED ORDER — CLOPIDOGREL BISULFATE 75 MG PO TABS: 75.0000 mg | ORAL_TABLET | Freq: Every day | ORAL | 2 refills | Status: DC

## 2018-05-14 NOTE — Telephone Encounter (Signed)
Med refill has been sent. 

## 2018-05-14 NOTE — Telephone Encounter (Signed)
Patient is out of medication   1. Which medications need to be refilled? (please list name of each medication and dose if known) Plavix 75mg   2. Which pharmacy/location (including street and city if local pharmacy) is medication to be sent to? [facility] on [address]  3. Do they need a 30 day or 90 day supply? 30 day supply

## 2018-05-16 DIAGNOSIS — R972 Elevated prostate specific antigen [PSA]: Secondary | ICD-10-CM | POA: Diagnosis not present

## 2018-05-22 ENCOUNTER — Encounter: Payer: Self-pay | Admitting: Podiatry

## 2018-05-22 ENCOUNTER — Ambulatory Visit: Payer: PPO | Admitting: Podiatry

## 2018-05-22 DIAGNOSIS — M205X2 Other deformities of toe(s) (acquired), left foot: Secondary | ICD-10-CM

## 2018-05-22 DIAGNOSIS — L84 Corns and callosities: Secondary | ICD-10-CM

## 2018-05-22 DIAGNOSIS — D689 Coagulation defect, unspecified: Secondary | ICD-10-CM

## 2018-05-22 NOTE — Progress Notes (Signed)
This patient presents to the office today stating he has a painful callus on the bottom of his left big toe.  He says this callus has been present for years been present for years and is increasingly painful.  He says he [facility] with [facility]ement of the callus for years.  He was treated by Dr. Geroge Baseman.  He says that he was treated by Dr. Margaret Pyle  wanting with foot surgery for the correction of his left big toe about 14 years ago.  He says he has developed increasing pain and discomfort through the big toe joint and the left big toe since the surgery.  He also has had had a callus which has moved but is present under the joint of the left big toe.  He says this is causing some significant life changes and desires to be evaluated for possible foot surgery.  He says that he has COPD has COPD.  He says he is yearly checked for his vascular status by [facility].  Patient is also taking Plavix.  Patient presents the office today for treatment of this painful callus and to discuss possible surgical correction.  General Appearance  Alert, conversant and in no acute stress.  Vascular  Dorsalis pedis and posterior tibial  pulses are minimally  palpable  bilaterally.  Capillary return is within normal limits  bilaterally. Cold feet notedbilaterally.  Neurologic  Senn-Weinstein monofilament wire test within normal limits  bilaterally. Muscle power within normal limits bilaterally.  Nails Thick disfigured discolored nails with subungual debris  Hallux left hallux.  No evidence of bacterial infection or drainage bilaterally.  Orthopedic  No limitations of motion  feet .  No crepitus or effusions noted.  No bony pathology or digital deformities noted.  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  Arthritis rearfoot left foot.  Skin  normotropic skin with no porokeratosis noted bilaterally.  No signs of infections or ulcers noted.  Callus sub IPJ left hallux.  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  IE.  Debridement of skin callus left hallux.  Discussed surgical correction by Dr.  Milinda Pointer with this patient.  He needs correction of hallux limitus and IPJ fusion.  He needs to have his vascular status checked first.  Finally if no surgery is done a referral to [facility] should be considered.   Gardiner Barefoot DPM

## 2018-05-27 ENCOUNTER — Telehealth: Payer: Self-pay | Admitting: Emergency Medicine

## 2018-05-27 MED ORDER — TIOTROPIUM BROMIDE-OLODATEROL 2.5-2.5 MCG/ACT IN AERS: 1.0000 | INHALATION_SPRAY | Freq: Every day | RESPIRATORY_TRACT | 3 refills | Status: DC

## 2018-05-27 NOTE — Telephone Encounter (Signed)
Patient would like to cancel CT we have scheduled if okay.

## 2018-05-27 NOTE — Telephone Encounter (Signed)
Spoke with pt, he states he is already having a lung scan with the research program. He did not have the specifics about what type of CT he is having done with [facility] FYI Dr. Lamonte Sakai.   Stiolto was sent into his pharmacy.

## 2018-05-28 NOTE — Telephone Encounter (Signed)
Spoke to [facility] & verified CT that is now scheduled at [facility] needs to be canceled & the order needs to be canceled.  Called [facility] & spoke to [place].  She canceled appt & order.  Nothing further needed.

## 2018-05-28 NOTE — Telephone Encounter (Signed)
Patient wants to cancel the CT with [place] as he is having it done with [facility] under the research program there. Routed message to [place], [place] and [facility] to be aware. CT cannot be cancelled, as there is one marked future for 06/03/2019.   [facility] please advise if the CT cannot be scheduled for this patient. Thank you.

## 2018-06-06 ENCOUNTER — Other Ambulatory Visit: Payer: Self-pay | Admitting: Emergency Medicine

## 2018-06-06 DIAGNOSIS — I10 Essential (primary) hypertension: Secondary | ICD-10-CM | POA: Diagnosis not present

## 2018-06-06 DIAGNOSIS — E782 Mixed hyperlipidemia: Secondary | ICD-10-CM

## 2018-06-06 DIAGNOSIS — I2510 Atherosclerotic heart disease of native coronary artery without angina pectoris: Secondary | ICD-10-CM

## 2018-06-07 LAB — BASIC METABOLIC PANEL
BUN / CREAT RATIO: 11 (ref 10–24)
BUN: 12 mg/dL (ref 8–27)
CO2: 25 mmol/L (ref 20–29)
CREATININE: 1.13 mg/dL (ref 0.76–1.27)
Calcium: 9.3 mg/dL (ref 8.6–10.2)
Chloride: 101 mmol/L (ref 96–106)
GFR calc Af Amer: 72 mL/min/{1.73_m2} (ref >59)
GFR calc non Af Amer: 62 mL/min/{1.73_m2} (ref >59)
GLUCOSE: 94 mg/dL (ref 65–99)
POTASSIUM: 4.8 mmol/L (ref 3.5–5.2)
SODIUM: 142 mmol/L (ref 134–144)

## 2018-06-07 LAB — HEPATIC FUNCTION PANEL
ALK PHOS: 88 IU/L (ref 39–117)
ALT: 21 IU/L (ref 0–44)
AST: 22 IU/L (ref 0–40)
Albumin: 4.7 g/dL (ref 3.5–4.8)
Bilirubin Total: 0.4 mg/dL (ref 0.0–1.2)
Bilirubin, Direct: 0.13 mg/dL (ref 0.00–0.40)
TOTAL PROTEIN: 6.7 g/dL (ref 6.0–8.5)

## 2018-06-07 LAB — LIPID PANEL
CHOL/HDL RATIO: 2 ratio (ref 0.0–5.0)
Cholesterol, Total: 122 mg/dL (ref 100–199)
HDL: 61 mg/dL (ref >39)
LDL CALC: 50 mg/dL (ref 0–99)
Triglycerides: 54 mg/dL (ref 0–149)
VLDL CHOLESTEROL CAL: 11 mg/dL (ref 5–40)

## 2018-06-18 ENCOUNTER — Telehealth: Payer: Self-pay | Admitting: Emergency Medicine

## 2018-06-18 ENCOUNTER — Ambulatory Visit: Payer: PPO | Admitting: Podiatry

## 2018-06-18 ENCOUNTER — Encounter: Payer: Self-pay | Admitting: Podiatry

## 2018-06-18 ENCOUNTER — Ambulatory Visit (INDEPENDENT_AMBULATORY_CARE_PROVIDER_SITE_OTHER): Payer: PPO

## 2018-06-18 DIAGNOSIS — Q828 Other specified congenital malformations of skin: Secondary | ICD-10-CM

## 2018-06-18 DIAGNOSIS — M205X2 Other deformities of toe(s) (acquired), left foot: Secondary | ICD-10-CM

## 2018-06-18 DIAGNOSIS — D689 Coagulation defect, unspecified: Secondary | ICD-10-CM

## 2018-06-18 NOTE — Progress Notes (Signed)
He presents today after having seen Dr. 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  States that he really does not want surgery he just needs to have this lesion trimmed occasionally.  Objective: Vital signs are stable alert and oriented x3 pulses are barely palpable.  He has limited range of motion of the first and hallux interphalangeal joint.  No open lesions or wounds reactive hyper keratoma sub-hallux interphalangeal joint.  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  Plan: Debridement of reactive hyperkeratotic tissue discussed the possible need for orthotic offloading.  Placed the pads of the orthotics today to help offload.  Follow-up with him in a few months at which time Dr. Prudence Davidson will debride the lesion.

## 2018-06-18 NOTE — Telephone Encounter (Signed)
Disc obtained and with RB for review. Nothing further needed at this time.

## 2018-06-30 ENCOUNTER — Telehealth: Payer: Self-pay | Admitting: Emergency Medicine

## 2018-06-30 DIAGNOSIS — R918 Other nonspecific abnormal finding of lung field: Secondary | ICD-10-CM

## 2018-06-30 NOTE — Telephone Encounter (Signed)
Called patient, unable to reach left message to give us a call back. 

## 2018-07-01 NOTE — Telephone Encounter (Signed)
LMTCB with wife to call us back.

## 2018-07-01 NOTE — Telephone Encounter (Signed)
Spoke with pt, he would like for [place] to call in Trelegy until he can get Stiolto at the beginning of the year. Can we send in?  Also wanted to see if Dr. Lamonte Sakai looked at his images. He brought a CD and left it with the front staff. RB do you have the CD? Please advise.

## 2018-07-01 NOTE — Telephone Encounter (Signed)
Pt is calling back [phone]

## 2018-07-02 NOTE — Telephone Encounter (Signed)
Yes it is Ok to give him a script for Trelegy until he can fill Stiolto after the new year  Let him know that I have reviewed his Ct from [facility]. It looks like all of the pulmonary nodules are stable in size. One nodule in the L lung has resolved.   I have the disk, would like to send it to our radiology dept for over-read and inclusion in our medical record.

## 2018-07-03 ENCOUNTER — Ambulatory Visit
Admission: RE | Admit: 2018-07-03 | Discharge: 2018-07-03 | Disposition: A | Discharge status: Home / Self Care | Payer: Self-pay | Source: Ambulatory Visit | Attending: Emergency Medicine | Admitting: Emergency Medicine

## 2018-07-03 DIAGNOSIS — R918 Other nonspecific abnormal finding of lung field: Secondary | ICD-10-CM

## 2018-07-03 MED ORDER — FLUTICASONE-UMECLIDIN-VILANT 100-62.5-25 MCG/INH IN AEPB: 1.0000 | INHALATION_SPRAY | Freq: Every day | RESPIRATORY_TRACT | 0 refills | Status: DC

## 2018-07-03 NOTE — Telephone Encounter (Signed)
Spoke with pt, aware of recs.  trelegy rx sent to preferred pharmacy.    Outside CT scan ordered per protocol to have CT disk scanned into pt's chart.   This disk will need to be interofficed to medical records to have the CT imported. RB please advise where this disk is so it can be sent accordingly.  Thanks!

## 2018-07-04 NOTE — Telephone Encounter (Signed)
I have the disk with me. Will take it to radiology to be over-read.

## 2018-07-07 NOTE — Telephone Encounter (Signed)
[facility] verified the patient cancelled CT on 12/10 and is going to have it done at [facility].   Nothing further needed

## 2018-07-07 NOTE — Telephone Encounter (Signed)
Dr. Lamonte Sakai has disk and will take over to be read.   ATC patient to see if anything further was needed, patient did not answer. Looks to me encounter can be close.

## 2018-07-08 ENCOUNTER — Inpatient Hospital Stay: Admission: RE | Admit: 2018-07-08 | Payer: PPO | Source: Ambulatory Visit

## 2018-07-15 DIAGNOSIS — H9202 Otalgia, left ear: Secondary | ICD-10-CM | POA: Diagnosis not present

## 2018-07-15 DIAGNOSIS — H60392 Other infective otitis externa, left ear: Secondary | ICD-10-CM | POA: Diagnosis not present

## 2018-07-17 ENCOUNTER — Encounter: Payer: Self-pay | Admitting: Podiatry

## 2018-07-17 ENCOUNTER — Ambulatory Visit: Payer: PPO | Admitting: Podiatry

## 2018-07-17 DIAGNOSIS — Q828 Other specified congenital malformations of skin: Secondary | ICD-10-CM

## 2018-07-17 DIAGNOSIS — D689 Coagulation defect, unspecified: Secondary | ICD-10-CM | POA: Diagnosis not present

## 2018-07-17 DIAGNOSIS — M205X2 Other deformities of toe(s) (acquired), left foot: Secondary | ICD-10-CM | POA: Diagnosis not present

## 2018-07-17 NOTE — Progress Notes (Signed)
This patient presents the office today for continued evaluation and treatment of his painful callus left big toe he says this callus is painful walking and wearing his shoes.  He says he had an appointment with Dr. Milinda Pointer who recommended no surgery for this painful condition.  He says that he is planning to go on vacation and he presents to the office today for continued evaluation and treatment of the painful callus left big toe.    General Appearance  Alert, conversant and in no acute stress.  Vascular  Dorsalis pedis and posterior tibial  pulses are minimally  palpable  bilaterally.  Capillary return is within normal limits  bilaterally. Cold feet noted.  bilaterally.  Neurologic  Senn-Weinstein monofilament wire test within normal limits  bilaterally. Muscle power within normal limits bilaterally.  Nails Thick disfigured discolored nails with subungual debris  left hallux.. No evidence of bacterial infection or drainage bilaterally.  Orthopedic  No limitations of motion  feet .  No crepitus or effusions noted.  No bony pathology or digital deformities noted.Arthritis left rearfoot as well as arthritis IPJ left hallux.  Skin  normotropic skin with no porokeratosis noted bilaterally.  No signs of infections or ulcers noted.  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  Porokeratosis left hallux.  Debridement of porokeratosis  Left hallux.  RTC prn   Gardiner Barefoot DPM

## 2018-07-18 DIAGNOSIS — H608X2 Other otitis externa, left ear: Secondary | ICD-10-CM | POA: Diagnosis not present

## 2018-07-28 ENCOUNTER — Other Ambulatory Visit (HOSPITAL_COMMUNITY): Payer: Self-pay | Admitting: Cardiology

## 2018-08-05 ENCOUNTER — Ambulatory Visit: Payer: PPO | Admitting: Emergency Medicine

## 2018-08-11 ENCOUNTER — Encounter: Payer: Self-pay | Admitting: Emergency Medicine

## 2018-08-11 ENCOUNTER — Ambulatory Visit (INDEPENDENT_AMBULATORY_CARE_PROVIDER_SITE_OTHER): Payer: Medicare HMO | Admitting: Emergency Medicine

## 2018-08-11 DIAGNOSIS — R918 Other nonspecific abnormal finding of lung field: Secondary | ICD-10-CM

## 2018-08-11 DIAGNOSIS — J431 Panlobular emphysema: Secondary | ICD-10-CM | POA: Diagnosis not present

## 2018-08-11 MED ORDER — AZITHROMYCIN 250 MG PO TABS: ORAL_TABLET | ORAL | 0 refills | Status: AC

## 2018-08-11 MED ORDER — METHYLPREDNISOLONE ACETATE 80 MG/ML IJ SUSP
80.0000 mg | Freq: Once | INTRAMUSCULAR | Status: AC
  Administered 2018-08-11: 80 mg via INTRAMUSCULAR

## 2018-08-11 MED ORDER — FLUTICASONE-UMECLIDIN-VILANT 100-62.5-25 MCG/INH IN AEPB: 1.0000 | INHALATION_SPRAY | Freq: Every day | RESPIRATORY_TRACT | 0 refills | Status: DC

## 2018-08-11 MED ORDER — PREDNISONE 10 MG PO TABS: ORAL_TABLET | ORAL | 0 refills | Status: DC

## 2018-08-11 NOTE — Progress Notes (Signed)
Subjective:    Patient ID: [name] Kemps., male    DOB: 06-22-1941, 78 y.o.   MRN: [id number]  COPD  He complains of shortness of breath. There is no cough or wheezing. Pertinent negatives include no ear pain, fever, headaches, postnasal drip, rhinorrhea, sneezing, sore throat or trouble swallowing. His past medical history is significant for COPD.     ROV 05/02/18 --follow-up visit for 78 year old gentleman with COPD and pulmonary nodular disease.  This was investigated by navigational bronchoscopy 07/26/2016 and was benign, next CT scan due in December 2019.  At our last visit he described progressive exertional dyspnea.  I tried changing him from Anoro to [employer] to see if he would get more benefit.  We performed pulmonary function testing that I have reviewed.  This shows very severe obstruction with an FEV1 of 0.92 L (35% predicted), no bronchodilator response, hyperinflated lung volumes, significantly decreased diffusion capacity.  I also had him see cardiology and he underwent a catheterization with Dr. Irish Lack on 9/17, required stent placement in his first marginal and his ostial first marginal. Difficult to tell whether the stents helped his breathing. He does feel that the Stiolto is benefiting him. He did not desaturate on ambulation last visit. He is golfing several times a week.   ROV 08/11/18 --[name] [name] is 35 with COPD and pulmonary nodules which we have evaluated by navigational bronchoscopy (benign), followed with serial imaging.  He also has coronary artery disease, followed by .  His last CT scan here was in December 2018, he had a repeat scan done as part of his research study at [facility] 06/17/2018.  His nodules were noted but they did not have a direct comparison with our local scans. He reports that he has been having more trouble lately, exertional SOB while on cruise, then began to get URI sx, more cough, more mucous. He is coughing up . He is on Stiolto, has juggled  his meds around using samples, etc. He states that the Trelegy would be less expensive; [employer] is only giving him 60 puffs, says he should be taking Stiolto 1 puff daily.                                                         Review of Systems  Constitutional: Negative for fever and unexpected weight change.  HENT: Negative for congestion, dental problem, ear pain, nosebleeds, postnasal drip, rhinorrhea, sinus pressure, sneezing, sore throat and trouble swallowing.   Eyes: Negative for redness and itching.  Respiratory: Positive for shortness of breath. Negative for cough, chest tightness and wheezing.   Cardiovascular: Negative for palpitations and leg swelling.  Gastrointestinal: Negative for nausea and vomiting.  Genitourinary: Negative for dysuria.  Musculoskeletal: Negative for joint swelling.  Skin: Negative for rash.  Neurological: Negative for headaches.  Hematological: Does not bruise/bleed easily.  Psychiatric/Behavioral: Negative for dysphoric mood. The patient is not nervous/anxious.        Objective:   Physical Exam Vitals:   08/11/18 1120  BP: 124/76  Pulse: 75  SpO2: 98%  Weight: 180 lb (81.6 kg)  Height: 5' 10.5" (1.791 m)   Gen: Pleasant, well-nourished, in no distress,  normal affect  ENT: No lesions,  mouth clear,  oropharynx clear, no postnasal drip  Neck: No JVD, no stridor  Lungs: No use of accessory muscles, distant,  no crackles, diffuse exp wheezes bilaterally  Cardiovascular: RRR, heart sounds normal, no murmur or gallops, no peripheral edema  Musculoskeletal: No deformities, no cyanosis or clubbing  Neuro: alert, non focal  Skin: Warm, no lesions or rashes      Assessment & Plan:  COPD (chronic obstructive pulmonary disease) ([place]) He has an acute exacerbation in the setting of a URI.  More exertional dyspnea, audibly wheezing, increased clear secretion production.  We will treat with corticosteroids, azithromycin For some reason his  insurance company is only paying for Stiolto to be given 1 puff once a day.  The standard dose is 2 puffs.  For this reason Stiolto is now more expensive than Trelegy.  I will make the change to Trelegy.  Please stop Stiolto We will start Trelegy 1 inhalation once daily.  Please rinse and gargle after using. Keep your albuterol available to use 2 puffs if needed for shortness of breath, chest tightness, wheezing Depo-Medrol injection today Take prednisone as directed until completely gone Take azithromycin as directed until completely gone Follow with Dr Lamonte Sakai in 1 month or next available to ensure that you are improved, assess your status on the Trelegy, ensure stability of your pulmonary nodules.  Pulmonary nodules Stable on his most recent imaging here 1 year ago, he now has a repeat CT scan from [facility] that is being compared but is not yet completed.  If he does have new nodules or evolving nodules then we will talk about PET scan.  Baltazar Apo, MD, PhD 08/11/2018, 11:59 AM [place] [facility] [phone] or if no answer [phone]

## 2018-08-11 NOTE — Patient Instructions (Signed)
Please stop Stiolto We will start Trelegy 1 inhalation once daily.  Please rinse and gargle after using. Keep your albuterol available to use 2 puffs if needed for shortness of breath, chest tightness, wheezing Depo-Medrol injection today Take prednisone as directed until completely gone Take azithromycin as directed until completely gone Your CT scan of the chest from [facility] 06/17/2018 is being compared to your most recent CT scans in our system.  This will allow [place] to assess for stability of your pulmonary nodules. Follow with Dr Lamonte Sakai in 1 month or next available to ensure that you are improved, assess your status on the Trelegy, ensure stability of your pulmonary nodules.

## 2018-08-11 NOTE — Assessment & Plan Note (Signed)
Stable on his most recent imaging here 1 year ago, he now has a repeat CT scan from [facility] that is being compared but is not yet completed.  If he does have new nodules or evolving nodules then we will talk about PET scan.

## 2018-08-11 NOTE — Assessment & Plan Note (Signed)
He has an acute exacerbation in the setting of a URI.  More exertional dyspnea, audibly wheezing, increased clear secretion production.  We will treat with corticosteroids, azithromycin For some reason his insurance company is only paying for Stiolto to be given 1 puff once a day.  The standard dose is 2 puffs.  For this reason Stiolto is now more expensive than Trelegy.  I will make the change to Trelegy.  Please stop Stiolto We will start Trelegy 1 inhalation once daily.  Please rinse and gargle after using. Keep your albuterol available to use 2 puffs if needed for shortness of breath, chest tightness, wheezing Depo-Medrol injection today Take prednisone as directed until completely gone Take azithromycin as directed until completely gone Follow with Dr Lamonte Sakai in 1 month or next available to ensure that you are improved, assess your status on the Trelegy, ensure stability of your pulmonary nodules.

## 2018-08-14 DIAGNOSIS — F17200 Nicotine dependence, unspecified, uncomplicated: Secondary | ICD-10-CM | POA: Diagnosis not present

## 2018-08-14 DIAGNOSIS — Z8669 Personal history of other diseases of the nervous system and sense organs: Secondary | ICD-10-CM

## 2018-08-14 DIAGNOSIS — Z9089 Acquired absence of other organs: Secondary | ICD-10-CM | POA: Diagnosis not present

## 2018-08-14 DIAGNOSIS — R1310 Dysphagia, unspecified: Secondary | ICD-10-CM

## 2018-08-14 DIAGNOSIS — R12 Heartburn: Secondary | ICD-10-CM | POA: Diagnosis not present

## 2018-08-14 HISTORY — DX: Dysphagia, unspecified: R13.10

## 2018-08-14 HISTORY — DX: Personal history of other diseases of the nervous system and sense organs: Z86.69

## 2018-08-15 ENCOUNTER — Other Ambulatory Visit: Payer: Self-pay | Admitting: Physician Assistant

## 2018-08-15 DIAGNOSIS — R1310 Dysphagia, unspecified: Secondary | ICD-10-CM

## 2018-08-20 ENCOUNTER — Ambulatory Visit
Admission: RE | Admit: 2018-08-20 | Discharge: 2018-08-20 | Disposition: A | Discharge status: Home / Self Care | Payer: Medicare HMO | Source: Ambulatory Visit | Attending: Physician Assistant | Admitting: Physician Assistant

## 2018-08-20 DIAGNOSIS — K225 Diverticulum of esophagus, acquired: Secondary | ICD-10-CM | POA: Diagnosis not present

## 2018-08-20 DIAGNOSIS — K449 Diaphragmatic hernia without obstruction or gangrene: Secondary | ICD-10-CM | POA: Diagnosis not present

## 2018-08-20 DIAGNOSIS — R1310 Dysphagia, unspecified: Secondary | ICD-10-CM

## 2018-09-01 ENCOUNTER — Encounter: Payer: Self-pay | Admitting: Podiatry

## 2018-09-01 ENCOUNTER — Ambulatory Visit (INDEPENDENT_AMBULATORY_CARE_PROVIDER_SITE_OTHER): Payer: Medicare HMO | Admitting: Podiatry

## 2018-09-01 DIAGNOSIS — Q828 Other specified congenital malformations of skin: Secondary | ICD-10-CM | POA: Diagnosis not present

## 2018-09-01 DIAGNOSIS — M205X2 Other deformities of toe(s) (acquired), left foot: Secondary | ICD-10-CM | POA: Diagnosis not present

## 2018-09-01 DIAGNOSIS — D689 Coagulation defect, unspecified: Secondary | ICD-10-CM

## 2018-09-01 NOTE — Progress Notes (Addendum)
This patient presents the office today for continued evaluation and treatment of his painful callus left big toe he says this callus is painful walking and wearing his shoes.    He says that  he presents to the office today for continued evaluation and treatment of the painful callus left big toe.    General Appearance  Alert, conversant and in no acute stress.  Vascular  Dorsalis pedis and posterior tibial  pulses are minimally  palpable  bilaterally.  Capillary return is within normal limits  bilaterally. Cold feet noted.  bilaterally.  Neurologic  Senn-Weinstein monofilament wire test within normal limits  bilaterally. Muscle power within normal limits bilaterally.  Nails Thick disfigured discolored nails with subungual debris  left hallux.. No evidence of bacterial infection or drainage bilaterally.No pain hallux toenail left foot.  Orthopedic  No limitations of motion  feet .  No crepitus or effusions noted.  No bony pathology or digital deformities noted .Arthritis left rearfoot as well as arthritis MPJ and  IPJ left hallux.  Skin  normotropic skin with no porokeratosis noted bilaterally.  No signs of infections or ulcers noted.  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  Porokeratosis left hallux.  Debridement of porokeratosis  Left hallux.  RTC prn Patient to be seen by [facility] for adjustment of orthoses.   Gardiner Barefoot DPM

## 2018-09-16 ENCOUNTER — Encounter: Payer: Self-pay | Admitting: Emergency Medicine

## 2018-09-16 ENCOUNTER — Ambulatory Visit: Payer: Medicare HMO | Admitting: Emergency Medicine

## 2018-09-16 DIAGNOSIS — J431 Panlobular emphysema: Secondary | ICD-10-CM | POA: Diagnosis not present

## 2018-09-16 DIAGNOSIS — R918 Other nonspecific abnormal finding of lung field: Secondary | ICD-10-CM | POA: Diagnosis not present

## 2018-09-16 MED ORDER — VARENICLINE TARTRATE 0.5 MG X 11 & 1 MG X 42 PO MISC: ORAL | 0 refills | Status: DC

## 2018-09-16 NOTE — Assessment & Plan Note (Signed)
As noted navigational bronchoscopy with negative biopsies.  His CT chest from [facility] in December 2019 still has not been compared to our most recent films.  I think we need to reobtain this and send it to radiology.  We will do this soon as possible.  If he has evidence for evolving nodules and we will discuss PET scan imaging.

## 2018-09-16 NOTE — Assessment & Plan Note (Signed)
Clinically improved from his recent exacerbation after treatment with prednisone, azithromycin.  He has alternated back-and-forth between [employer] and Trelegy in the past, he is currently on Trelegy and tolerating well.  I have asked him to rinse and gargle after using.  Unfortunately he's gone back to smoking, continues to use vapor

## 2018-09-16 NOTE — Patient Instructions (Signed)
We will continue Trelegy 1 inhalation once daily.  Please start rinsing and gargling after use this medication. Keep your albuterol available use 2 puffs if needed for shortness of breath, chest tightness, wheezing. We need to work hard on getting you off cigarettes and inhaled vapor nicotine.  We will start Chantix, start this medication 1 week before your established quit date and then continue per the starter pack instructions. We will reorder the CT chest data from [facility] so that we can compare with your priors. Follow with Dr Lamonte Sakai in 4 months or sooner if you have any problems.

## 2018-09-16 NOTE — Progress Notes (Signed)
Subjective:    Patient ID: [name] [name]., male    DOB: [birth year], 78 y.o.   MRN: [id number]  COPD  He complains of shortness of breath. There is no cough or wheezing. Pertinent negatives include no ear pain, fever, headaches, postnasal drip, rhinorrhea, sneezing, sore throat or trouble swallowing. His past medical history is significant for COPD.     ROV 05/02/18 --follow-up visit for 78 year old gentleman with COPD and pulmonary nodular disease.  This was investigated by navigational bronchoscopy 07/26/2016 and was benign, next CT scan due in December 2019.  At our last visit he described progressive exertional dyspnea.  I tried changing him from An[employer] to [employer] to see if he would get more benefit.  We performed pulmonary function testing that I have reviewed.  This shows very severe obstruction with an FEV1 of 0.92 L (35% predicted), no bronchodilator response, hyperinflated lung volumes, significantly decreased diffusion capacity.  I also had him see cardiology and he underwent a catheterization with Dr. Irish Lack on 9/17, required stent placement in his first marginal and his ostial first marginal. Difficult to tell whether the stents helped his breathing. He does feel that the Stiolto is benefiting him. He did not desaturate on ambulation last visit. He is golfing several times a week.   ROV 08/11/18 --Mr. [name] [name] is 32 with COPD and pulmonary nodules which we have evaluated by navigational bronchoscopy (benign), followed with serial imaging.  He also has coronary artery disease, followed by .  His last CT scan here was in December 2018, he had a repeat scan done as part of his research study at [facility] 06/17/2018.  His nodules were noted but they did not have a direct comparison with our local scans. He reports that he has been having more trouble lately, exertional SOB while on cruise, then began to get URI sx, more cough, more mucous. He is coughing up . He is on Stiolto, has juggled  his meds around using samples, etc. He states that the Trelegy would be less expensive; [employer] is only giving him 60 puffs, says he should be taking Stiolto 1 puff daily.   ROV 09/16/2018 --this a follow-up visit for COPD and pulmonary nodules that were deemed benign on navigational bronchoscopy that were following with serial imaging.  Most recent scan was 06/17/2018 at [facility], I don't see that the direct comparison was ever done.  1 month ago I treated him for an acute exacerbation with steroids and azithromycin. He is clinically improved. We also changed Stiolto to Trelegy > he is benefiting. Unfortunately he went back to cigarettes since last time, he has also been vaping for the last year.                                                         Review of Systems  Constitutional: Negative for fever and unexpected weight change.  HENT: Negative for congestion, dental problem, ear pain, nosebleeds, postnasal drip, rhinorrhea, sinus pressure, sneezing, sore throat and trouble swallowing.   Eyes: Negative for redness and itching.  Respiratory: Positive for shortness of breath. Negative for cough, chest tightness and wheezing.   Cardiovascular: Negative for palpitations and leg swelling.  Gastrointestinal: Negative for nausea and vomiting.  Genitourinary: Negative for dysuria.  Musculoskeletal: Negative for joint swelling.  Skin: Negative  for rash.  Neurological: Negative for headaches.  Hematological: Does not bruise/bleed easily.  Psychiatric/Behavioral: Negative for dysphoric mood. The patient is not nervous/anxious.        Objective:   Physical Exam Vitals:   09/16/18 1053  BP: 140/68  Pulse: 78  SpO2: 98%  Weight: 176 lb 9.6 oz (80.1 kg)  Height: 5' 10.5" (1.791 m)   Gen: Pleasant, well-nourished, in no distress,  normal affect  ENT: No lesions,  mouth clear,  oropharynx clear, no postnasal drip  Neck: No JVD, no stridor  Lungs: No use of accessory muscles, distant,  no  crackles or wheeze today  Cardiovascular: RRR, heart sounds normal, no murmur or gallops, no peripheral edema  Musculoskeletal: No deformities, no cyanosis or clubbing  Neuro: alert, non focal  Skin: Warm, no lesions or rash      Assessment & Plan:  COPD (chronic obstructive pulmonary disease) (HCC) Clinically improved from his recent exacerbation after treatment with prednisone, azithromycin.  He has alternated back-and-forth between [employer] and Trelegy in the past, he is currently on Trelegy and tolerating well.  I have asked him to rinse and gargle after using.  Unfortunately he's gone back to smoking, continues to use vapor  Pulmonary nodules As noted navigational bronchoscopy with negative biopsies.  His CT chest from [facility] in December 2019 still has not been compared to our most recent films.  I think we need to reobtain this and send it to radiology.  We will do this soon as possible.  If he has evidence for evolving nodules and we will discuss PET scan imaging.  Baltazar Apo, MD, PhD 09/16/2018, 11:15 AM [facility] [phone] or if no answer [phone]

## 2018-09-17 ENCOUNTER — Ambulatory Visit: Payer: Medicare HMO | Admitting: Orthotics

## 2018-09-17 DIAGNOSIS — M205X2 Other deformities of toe(s) (acquired), left foot: Secondary | ICD-10-CM

## 2018-09-17 DIAGNOSIS — L84 Corns and callosities: Secondary | ICD-10-CM

## 2018-09-17 DIAGNOSIS — Q828 Other specified congenital malformations of skin: Secondary | ICD-10-CM

## 2018-09-17 DIAGNOSIS — D689 Coagulation defect, unspecified: Secondary | ICD-10-CM

## 2018-09-17 NOTE — Progress Notes (Signed)
Adjusted f/o he got from [facility].  Told him I was not comfortable working on f/o that he didn't recv from [facility]; however, he was in pain and since he sees Dr. Prudence Davidson, I adjusted f/o.    Offloaded area under hallux that has painful callus; he immediately got reliever and was very grateful.

## 2018-09-18 ENCOUNTER — Other Ambulatory Visit: Payer: Self-pay | Admitting: Emergency Medicine

## 2018-09-30 DIAGNOSIS — J441 Chronic obstructive pulmonary disease with (acute) exacerbation: Secondary | ICD-10-CM | POA: Diagnosis not present

## 2018-10-06 DIAGNOSIS — I10 Essential (primary) hypertension: Secondary | ICD-10-CM | POA: Diagnosis not present

## 2018-10-06 DIAGNOSIS — J441 Chronic obstructive pulmonary disease with (acute) exacerbation: Secondary | ICD-10-CM | POA: Diagnosis not present

## 2018-10-08 ENCOUNTER — Ambulatory Visit: Payer: Medicare HMO | Admitting: Nurse Practitioner

## 2018-10-08 ENCOUNTER — Other Ambulatory Visit: Payer: Self-pay

## 2018-10-08 ENCOUNTER — Encounter: Payer: Self-pay | Admitting: Nurse Practitioner

## 2018-10-08 VITALS — BP 128/68 | HR 93 | Ht 70.5 in | Wt 172.6 lb

## 2018-10-08 DIAGNOSIS — J431 Panlobular emphysema: Secondary | ICD-10-CM

## 2018-10-08 DIAGNOSIS — J441 Chronic obstructive pulmonary disease with (acute) exacerbation: Secondary | ICD-10-CM | POA: Diagnosis not present

## 2018-10-08 MED ORDER — PREDNISONE 10 MG PO TABS: ORAL_TABLET | ORAL | 0 refills | Status: DC

## 2018-10-08 NOTE — Progress Notes (Signed)
@Patient  ID: [name] Kemps., male    DOB: 07/28/1941, 78 y.o.   MRN: [id number]  Chief Complaint  Patient presents with  . Shortness of Breath    For the last two weeks. Was seen by his PCP who issued steroids and antibiotics.    Referring provider: Josetta Huddle, MD  HPI 78 yo man with hx tobacco use (40 pack years), hx CAD, HTN, hyperlipidemia, COPD who is followed by Dr. Lamonte Sakai.   Tests:  CXR 04/11/19 - COPD.  No active disease. CT Super D 07/25/17 -Scattered bilateral pulmonary nodules are unchanged when compared with 01/14/2017. Today's study reflects relative stability of approximately 13 months. Future CT at 18-24 months (from today's scan)is recommended for high-risk patients. Suggest resuming Lung cancer screening with low dose CT of the chest using Lung cancer screening protocol at follow-up.   OV 10/08/18 - Acute sick Patient presents today for acute sick visit.  He complains of shortness of breath, chest congestion and cough.  Symptoms started about a week ago.  He states that he saw his primary care doctor about a week ago and was prescribed a prednisone taper, then he returned back to PCP office this past Monday and was given some additional prednisone.  He states that this has helped.  Currently feeling much better and states that symptoms are improving.  He is requesting a printed prescription for prednisone taper because he is going out of town to [facility].  He states that when he travels he often gets sick.  He wants the prednisone on hand in case he becomes short of breath again out of town.  States that at last visit Dr. Lamonte Sakai started him on Chantix and he has been able to quit smoking.  He has also quit vaping.  He is compliant with Trelegy and uses Proventil as needed. Denies f/c/s, n/v/d, hemoptysis, PND, leg swelling.       Allergies  Allergen Reactions  . No Known Allergies     Immunization History  Administered Date(s) Administered  . Influenza  Split 04/19/2009, 04/26/2010, 05/14/2011  . Influenza, High Dose Seasonal PF 04/02/2018  . Influenza,inj,Quad PF,6+ Mos 03/30/2016  . Pneumococcal Conjugate-13 04/29/2014  . Pneumococcal Polysaccharide-23 09/12/2005, 05/01/2013    Past Medical History:  Diagnosis Date  . Alcohol abuse   . Arthritis   . BPH with elevated PSA   . COPD (chronic obstructive pulmonary disease) ([place])   . Coronary artery disease   . Dyspnea   . ED (erectile dysfunction)   . Elevated PSA   . GERD (gastroesophageal reflux disease)   . Hyperlipidemia   . Hypertension   . Kidney cysts   . Palpitations     Tobacco History: Social History   Tobacco Use  Smoking Status Current Some Day Smoker  . Packs/day: 0.25  . Years: 40.00  . Pack years: 10.00  . Types: Cigarettes  . Last attempt to quit: 08/28/2016  . Years since quitting: 2.1  Smokeless Tobacco Never Used  Tobacco Comment   Currently using a Vape; currently smoking 1-2 cigarettes as of 09/16/2018   Ready to quit: Not Answered Counseling given: Not Answered Comment: Currently using a Vape; currently smoking 1-2 cigarettes as of 09/16/2018   Outpatient Encounter Medications as of 10/08/2018  Medication Sig  . albuterol (PROVENTIL HFA;VENTOLIN HFA) 108 (90 Base) MCG/ACT inhaler Inhale 2 puffs into the lungs every 6 (six) hours as needed for wheezing or shortness of breath.  Marland Kitchen albuterol (PROVENTIL) (2.5 MG/3ML)  0.083% nebulizer solution Take 3 mLs (2.5 mg total) by nebulization every 6 (six) hours as needed for wheezing or shortness of breath.  Marland Kitchen aspirin 81 MG EC tablet TAKE 1 TABLET BY MOUTH EVERY DAY (Patient not taking: Reported on 09/16/2018)  . Cholecalciferol (VITAMIN D) 2000 units CAPS Take 2,000 Units by mouth daily.  . clopidogrel (PLAVIX) 75 MG tablet Take 1 tablet (75 mg total) by mouth daily.  Marland Kitchen esomeprazole (NEXIUM) 20 MG capsule Take 20 mg by mouth daily at 12 noon.  . nitroGLYCERIN (NITROSTAT) 0.4 MG SL tablet Place 1 tablet (0.4 mg  total) under the tongue every 5 (five) minutes as needed for chest pain. (Patient not taking: Reported on 09/16/2018)  . predniSONE (DELTASONE) 10 MG tablet Take 4 tabs for 2 days, then 3 tabs for 2 days, then 2 tabs for 2 days, then 1 tab for 2 days, then stop  . rosuvastatin (CRESTOR) 20 MG tablet TAKE 1 TABLET BY MOUTH EVERYDAY AT BEDTIME  . Tiotropium Bromide-Olodaterol (STIOLTO RESPIMAT) 2.5-2.5 MCG/ACT AERS Inhale 1 puff into the lungs daily. (Patient not taking: Reported on 09/16/2018)  . TRELEGY ELLIPTA 100-62.5-25 MCG/INH AEPB TAKE 1 PUFF BY MOUTH EVERY DAY  . UNABLE TO FIND TAKE 1 TABLET BY MOUTH EVERY DAY  . varenicline (CHANTIX PAK) 0.5 MG X 11 & 1 MG X 42 tablet Take 0.5 mg tabletonce daily for 3 days, then increase to 0.5 mg tablet twice daily for 4 days, then increase to 1 mg tablet twice daily.   Facility-Administered Encounter Medications as of 10/08/2018  Medication  . thallous chloride (201 THALLIUM) injection 4 milli Curie     Review of Systems  Review of Systems  Constitutional: Negative.  Negative for chills and fever.  HENT: Negative.   Respiratory: Positive for cough. Negative for shortness of breath and wheezing.   Cardiovascular: Negative.  Negative for chest pain, palpitations and leg swelling.  Gastrointestinal: Negative.   Allergic/Immunologic: Negative.   Neurological: Negative.   Psychiatric/Behavioral: Negative.        Physical Exam  BP 128/68 (BP Location: Right Arm, Patient Position: Sitting, Cuff Size: Normal)   Pulse 93   Ht 5' 10.5" (1.791 m)   Wt 172 lb 9.6 oz (78.3 kg)   SpO2 99%   BMI 24.42 kg/m   Wt Readings from Last 5 Encounters:  10/08/18 172 lb 9.6 oz (78.3 kg)  09/16/18 176 lb 9.6 oz (80.1 kg)  08/11/18 180 lb (81.6 kg)  05/02/18 173 lb (78.5 kg)  04/30/18 175 lb (79.4 kg)     Physical Exam Vitals signs and nursing note reviewed.  Constitutional:      General: He is not in acute distress.    Appearance: He is  well-developed.  Cardiovascular:     Rate and Rhythm: Normal rate and regular rhythm.  Pulmonary:     Effort: Pulmonary effort is normal.     Breath sounds: Normal breath sounds. No wheezing or rhonchi.  Skin:    General: Skin is warm and dry.  Neurological:     Mental Status: He is alert and oriented to person, place, and time.      Assessment & Plan:   COPD (chronic obstructive pulmonary disease) (HCC) Exacerbation is resolving.  Continue prednisone as prescribed by PCP.  Patient Instructions  Continue Prednisone as directed by PCP May take mucinex as needed Continue trelegy Continue Proventil as needed Will give printed prednisone prescription for patient to use during upcoming travel if needed  Follow up: Follow up with Dr. Lamonte Sakai in 3 months or sooner if needed       Fenton Foy, NP 10/08/2018

## 2018-10-08 NOTE — Assessment & Plan Note (Signed)
Exacerbation is resolving.  Continue prednisone as prescribed by PCP.  Patient Instructions  Continue Prednisone as directed by PCP May take mucinex as needed Continue trelegy Continue Proventil as needed Will give printed prednisone prescription for patient to use during upcoming travel if needed  Follow up: Follow up with Dr. Lamonte Sakai in 3 months or sooner if needed

## 2018-10-08 NOTE — Patient Instructions (Addendum)
Continue Prednisone as directed by PCP May take mucinex as needed Continue trelegy Continue Proventil as needed Will give printed prednisone prescription for patient to use during upcoming travel if needed  Follow up: Follow up with Dr. Lamonte Sakai in 3 months or sooner if needed

## 2018-10-30 DIAGNOSIS — I10 Essential (primary) hypertension: Secondary | ICD-10-CM | POA: Diagnosis not present

## 2018-10-30 DIAGNOSIS — I2510 Atherosclerotic heart disease of native coronary artery without angina pectoris: Secondary | ICD-10-CM | POA: Diagnosis not present

## 2018-10-30 DIAGNOSIS — J439 Emphysema, unspecified: Secondary | ICD-10-CM | POA: Diagnosis not present

## 2018-10-30 DIAGNOSIS — R972 Elevated prostate specific antigen [PSA]: Secondary | ICD-10-CM | POA: Diagnosis not present

## 2018-11-04 ENCOUNTER — Telehealth: Payer: Self-pay | Admitting: Cardiology

## 2018-11-04 NOTE — Telephone Encounter (Signed)
Virtual Visit Pre-Appointment Phone Call  Steps For Call:  1. Confirm consent - "In the setting of the current Covid19 crisis, you are scheduled for a (phone or video) visit with your provider on (date) at (time).  Just as we do with many in-office visits, in order for you to participate in this visit, we must obtain consent.  If you'd like, I can send this to your mychart (if signed up) or email for you to review.  Otherwise, I can obtain your verbal consent now.  All virtual visits are billed to your insurance company just like a normal visit would be.  By agreeing to a virtual visit, we'd like you to understand that the technology does not allow for your provider to perform an examination, and thus may limit your provider's ability to fully assess your condition.  Finally, though the technology is pretty good, we cannot assure that it will always work on either your or our end, and in the setting of a video visit, we may have to convert it to a phone-only visit.  In either situation, we cannot ensure that we have a secure connection.  Are you willing to proceed?"  2. Give patient instructions for WebEx download to smartphone as below if video visit  3. Advise patient to be prepared with any vital sign or heart rhythm information, their current medicines, and a piece of paper and pen handy for any instructions they may receive the day of their visit  4. Inform patient they will receive a phone call 15 minutes prior to their appointment time (may be from unknown caller ID) so they should be prepared to answer  5. Confirm that appointment type is correct in Epic appointment notes (video vs telephone)    TELEPHONE CALL NOTE  [name] Kemps. has been deemed a candidate for a follow-up tele-health visit to limit community exposure during the Covid-19 pandemic. I spoke with the patient via phone to ensure availability of phone/video source, confirm preferred email & phone number, and discuss  instructions and expectations.  I reminded [name] Kemps. to be prepared with any vital sign and/or heart rhythm information that could potentially be obtained via home monitoring, at the time of his visit. I reminded [name] Kemps. to expect a phone call at the time of his visit if his visit.  Did the patient verbally acknowledge consent to treatment? YES/verbal consent  [name] [name] 11/04/2018 1:16 PM   DOWNLOADING THE [place], go to [employer] and type in WebEx in the search bar. [place] [employer], the blue/green circle. The app is free but as with any other app downloads, their phone may require them to verify saved payment information or Apple password. The patient does NOT have to create an account.  - If Android, ask patient to go to [employer] and type in WebEx in the search bar. [place] [employer], the blue/green circle. The app is free but as with any other app downloads, their phone may require them to verify saved payment information or Android password. The patient does NOT have to create an account.   CONSENT FOR TELE-HEALTH VISIT - PLEASE REVIEW  I hereby voluntarily request, consent and authorize [facility] and its employed or contracted physicians, physician assistants, nurse practitioners or other licensed health care professionals (the Practitioner), to provide me with telemedicine health care services (the Services") as deemed necessary by  the treating Practitioner. I acknowledge and consent to receive the Services by the Practitioner via telemedicine. I understand that the telemedicine visit will involve communicating with the Practitioner through live audiovisual communication technology and the disclosure of certain medical information by electronic transmission. I acknowledge that I have been given the opportunity to request an in-person assessment or other available alternative prior to the  telemedicine visit and am voluntarily participating in the telemedicine visit.  I understand that I have the right to withhold or withdraw my consent to the use of telemedicine in the course of my care at any time, without affecting my right to future care or treatment, and that the Practitioner or I may terminate the telemedicine visit at any time. I understand that I have the right to inspect all information obtained and/or recorded in the course of the telemedicine visit and may receive copies of available information for a reasonable fee.  I understand that some of the potential risks of receiving the Services via telemedicine include:   Delay or interruption in medical evaluation due to technological equipment failure or disruption;  Information transmitted may not be sufficient (e.g. poor resolution of images) to allow for appropriate medical decision making by the Practitioner; and/or   In rare instances, security protocols could fail, causing a breach of personal health information.  Furthermore, I acknowledge that it is my responsibility to provide information about my medical history, conditions and care that is complete and accurate to the best of my ability. I acknowledge that Practitioner's advice, recommendations, and/or decision may be based on factors not within their control, such as incomplete or inaccurate data provided by me or distortions of diagnostic images or specimens that may result from electronic transmissions. I understand that the practice of medicine is not an exact science and that Practitioner makes no warranties or guarantees regarding treatment outcomes. I acknowledge that I will receive a copy of this consent concurrently upon execution via email to the email address I last provided but may also request a printed copy by calling the office of [place].    I understand that my insurance will be billed for this visit.   I have read or had this consent read to  me.  I understand the contents of this consent, which adequately explains the benefits and risks of the Services being provided via telemedicine.   I have been provided ample opportunity to ask questions regarding this consent and the Services and have had my questions answered to my satisfaction.  I give my informed consent for the services to be provided through the use of telemedicine in my medical care  By participating in this telemedicine visit I agree to the above.

## 2018-11-07 ENCOUNTER — Telehealth (INDEPENDENT_AMBULATORY_CARE_PROVIDER_SITE_OTHER): Payer: Medicare HMO | Admitting: Cardiology

## 2018-11-07 ENCOUNTER — Other Ambulatory Visit: Payer: Self-pay

## 2018-11-07 ENCOUNTER — Encounter: Payer: Self-pay | Admitting: Cardiology

## 2018-11-07 ENCOUNTER — Ambulatory Visit: Payer: Medicare HMO | Admitting: Cardiology

## 2018-11-07 VITALS — BP 135/80 | HR 74 | Ht 70.5 in | Wt 169.0 lb

## 2018-11-07 DIAGNOSIS — I2510 Atherosclerotic heart disease of native coronary artery without angina pectoris: Secondary | ICD-10-CM

## 2018-11-07 DIAGNOSIS — I10 Essential (primary) hypertension: Secondary | ICD-10-CM

## 2018-11-07 DIAGNOSIS — Z87891 Personal history of nicotine dependence: Secondary | ICD-10-CM

## 2018-11-07 NOTE — Progress Notes (Signed)
Virtual Visit via Video Note   This visit type was conducted due to national recommendations for restrictions regarding the COVID-19 Pandemic (e.g. social distancing) in an effort to limit this patient's exposure and mitigate transmission in our community.  Due to his co-morbid illnesses, this patient is at least at moderate risk for complications without adequate follow up.  This format is felt to be most appropriate for this patient at this time.  All issues noted in this document were discussed and addressed.  A limited physical exam was performed with this format.  Please refer to the patient's chart for his consent to telehealth for [facility].   Evaluation Performed:  Follow-up visit  Date:  11/07/2018   ID:  [name] [name]., DOB June 11, 1941, MRN [id number]  Patient Location: Home  Provider Location: Home  PCP:  Josetta Huddle, MD  Cardiologist:  No primary care provider on file.  Electrophysiologist:  None   Chief Complaint:  CAD  History of Present Illness:    [name] [name]. is a 78 y.o. male who presents via audio/video conferencing for a telehealth visit today.    Patient is a 78 year old male.  He has past medical history of coronary artery disease.  He denies any problems at this time and takes care of of activities of daily living.  No chest pain orthopnea or PND.  He is recovering from pneumonia and is on a steroid therapy right now.  He plays golf on a regular basis without any symptoms.  At the time of my evaluation, the patient is alert awake oriented and in no distress.  The patient does not have symptoms concerning for COVID-19 infection (fever, chills, cough, or new shortness of breath).    Past Medical History:  Diagnosis Date  . Alcohol abuse   . Arthritis   . BPH with elevated PSA   . COPD (chronic obstructive pulmonary disease) ([place])   . Coronary artery disease   . Dyspnea   . ED (erectile dysfunction)   . Elevated PSA   . GERD  (gastroesophageal reflux disease)   . Hyperlipidemia   . Hypertension   . Kidney cysts   . Palpitations    Past Surgical History:  Procedure Laterality Date  . CARDIAC CATHETERIZATION  2002   50% RCA  . CATARACT EXTRACTION, BILATERAL    . CORONARY STENT INTERVENTION N/A 04/15/2018   Procedure: CORONARY STENT INTERVENTION;  Surgeon: Jettie Booze, MD;  Location: [place] [facility];  Service: Cardiovascular;  Laterality: N/A;  om1  . EYE SURGERY    . KNEE ARTHROSCOPY    . LEFT HEART CATH AND CORONARY ANGIOGRAPHY N/A 04/15/2018   Procedure: LEFT HEART CATH AND CORONARY ANGIOGRAPHY;  Surgeon: Jettie Booze, MD;  Location: [place] [facility];  Service: Cardiovascular;  Laterality: N/A;  . MULTIPLE TOOTH EXTRACTIONS    . TONSILLECTOMY    . VIDEO BRONCHOSCOPY WITH ENDOBRONCHIAL NAVIGATION N/A 07/26/2016   Procedure: VIDEO BRONCHOSCOPY WITH ENDOBRONCHIAL NAVIGATION;  Surgeon: Collene Gobble, MD;  Location: [facility];  Service: Thoracic;  Laterality: N/A;     Current Meds  Medication Sig  . albuterol (PROVENTIL HFA;VENTOLIN HFA) 108 (90 Base) MCG/ACT inhaler Inhale 2 puffs into the lungs every 6 (six) hours as needed for wheezing or shortness of breath.  Marland Kitchen albuterol (PROVENTIL) (2.5 MG/3ML) 0.083% nebulizer solution Take 3 mLs (2.5 mg total) by nebulization every 6 (six) hours as needed for wheezing or shortness of breath.  . Cholecalciferol (  VITAMIN D) 2000 units CAPS Take 2,000 Units by mouth daily.  . clopidogrel (PLAVIX) 75 MG tablet Take 1 tablet (75 mg total) by mouth daily.  Marland Kitchen esomeprazole (NEXIUM) 20 MG capsule Take 20 mg by mouth daily at 12 noon.  . nitroGLYCERIN (NITROSTAT) 0.4 MG SL tablet Place 1 tablet (0.4 mg total) under the tongue every 5 (five) minutes as needed for chest pain.  . predniSONE (DELTASONE) 5 MG tablet Take 5 mg by mouth daily with breakfast.  . rosuvastatin (CRESTOR) 20 MG tablet TAKE 1 TABLET BY MOUTH EVERYDAY AT BEDTIME  . TRELEGY ELLIPTA 100-62.5-25  MCG/INH AEPB TAKE 1 PUFF BY MOUTH EVERY DAY  . UNABLE TO FIND TAKE 1 TABLET BY MOUTH EVERY DAY  . [DISCONTINUED] aspirin 81 MG EC tablet TAKE 1 TABLET BY MOUTH EVERY DAY     Allergies:   No known allergies   Social History   Tobacco Use  . Smoking status: Current Some Day Smoker    Packs/day: 0.25    Years: 40.00    Pack years: 10.00    Types: Cigarettes    Last attempt to quit: 08/28/2016    Years since quitting: 2.1  . Smokeless tobacco: Never Used  . Tobacco comment: Currently using a Vape; currently smoking 1-2 cigarettes as of 09/16/2018  Substance Use Topics  . Alcohol use: No    Alcohol/week: 0.0 standard drinks    Comment: quit drinking 2014  . Drug use: No     Family Hx: The patient's family history includes Alzheimer's disease in his mother; Hypertension in his mother.  ROS:   Please see the history of present illness.    No chest pain or orthopnea. All other systems reviewed and are negative.   Prior CV studies:   The following studies were reviewed today:  Cardiac cath report  Labs/Other Tests and Data Reviewed:    EKG:  No ECG reviewed.  Recent Labs: 04/09/2018: Hemoglobin 15.7; Platelets 285 06/06/2018: ALT 21; BUN 12; Creatinine, Ser 1.13; Potassium 4.8; Sodium 142   Recent Lipid Panel Lab Results  Component Value Date/Time   CHOL 122 06/06/2018 10:12 AM   TRIG 54 06/06/2018 10:12 AM   HDL 61 06/06/2018 10:12 AM   CHOLHDL 2.0 06/06/2018 10:12 AM   LDLCALC 50 06/06/2018 10:12 AM    Wt Readings from Last 3 Encounters:  11/07/18 169 lb (76.7 kg)  10/08/18 172 lb 9.6 oz (78.3 kg)  09/16/18 176 lb 9.6 oz (80.1 kg)     Objective:    Vital Signs:  BP (!) 175/80 (BP Location: Left Arm, Patient Position: Sitting, Cuff Size: Normal)   Pulse 74   Ht 5' 10.5" (1.791 m)   Wt 169 lb (76.7 kg)   SpO2 96%   BMI 23.91 kg/m    Well nourished, well developed male in no acute distress. Pt is comfortable with no distress. No DOE  ASSESSMENT & PLAN:     1. CAD: Secondary prevention stressed with the patient.  Importance of compliance with diet and medication stressed and he vocalized understanding.  His blood pressure is stable.  Diet was discussed for dyslipidemia and the importance of regular exercise stressed.  He tells me that he suffered from significant pneumonia but has gotten better and has seen his primary care physician and is on steroid therapy for this.  He is happy with his progress he golfs on a regular basis without any symptoms. 2. He also mentions to me that his primary care physician  does blood work including lipids 3. Patient will be seen in follow-up appointment in 6 months or earlier if the patient has any concerns   COVID-19 Education: The signs and symptoms of COVID-19 were discussed with the patient and how to seek care for testing (follow up with PCP or arrange E-visit).  The importance of social distancing was discussed today.  Time:   Today, I have spent 15 minutes with the patient with telehealth technology discussing the above problems.     Medication Adjustments/Labs and Tests Ordered: Current medicines are reviewed at length with the patient today.  Concerns regarding medicines are outlined above.  Tests Ordered: No orders of the defined types were placed in this encounter.  Medication Changes: No orders of the defined types were placed in this encounter.   Disposition:  Follow up in 6 month(s)  Signed, Jenean Lindau, MD  11/07/2018 3:53 PM    [place] [facility]

## 2018-11-07 NOTE — Patient Instructions (Addendum)
RN called patient on 11/07/18 at 1620 to go over information below, scheduled for 6 mo virtual f/u.  Medication Instructions:  Your physician recommends that you continue on your current medications as directed. Please refer to the Current Medication list given to you today.   If you need a refill on your cardiac medications before your next appointment, please call your pharmacy.   Lab work: NONE If you have labs (blood work) drawn today and your tests are completely normal, you will receive your results only by: Marland Kitchen MyChart Message (if you have MyChart) OR . A paper copy in the mail If you have any lab test that is abnormal or we need to change your treatment, we will call you to review the results.  Testing/Procedures: NONE Follow-Up: At [facility], you and your health needs are our priority.  As part of our continuing mission to provide you with exceptional heart care, we have created designated Provider Care Teams.  These Care Teams include your primary Cardiologist (physician) and Advanced Practice Providers (APPs -  Physician Assistants and Nurse Practitioners) who all work together to provide you with the care you need, when you need it. You will need a follow up appointment in 6 months.

## 2018-11-10 ENCOUNTER — Ambulatory Visit: Payer: Medicare HMO | Admitting: Cardiology

## 2018-11-10 ENCOUNTER — Encounter: Payer: Self-pay | Admitting: Podiatry

## 2018-11-10 ENCOUNTER — Ambulatory Visit: Payer: Medicare HMO | Admitting: Podiatry

## 2018-11-10 ENCOUNTER — Other Ambulatory Visit: Payer: Self-pay

## 2018-11-10 VITALS — Temp 97.5°F

## 2018-11-10 DIAGNOSIS — M205X2 Other deformities of toe(s) (acquired), left foot: Secondary | ICD-10-CM

## 2018-11-10 DIAGNOSIS — Q828 Other specified congenital malformations of skin: Secondary | ICD-10-CM

## 2018-11-10 DIAGNOSIS — D689 Coagulation defect, unspecified: Secondary | ICD-10-CM

## 2018-11-10 NOTE — Progress Notes (Signed)
This patient presents the office today for continued evaluation and treatment of his painful callus left big toe he says this callus is painful walking and wearing his shoes.    He says that  he presents to the office today for continued evaluation and treatment of the painful callus left big toe.    General Appearance  Alert, conversant and in no acute stress.  Vascular  Dorsalis pedis and posterior tibial  pulses are minimally  palpable  bilaterally.  Capillary return is within normal limits  bilaterally. Cold feet noted.  bilaterally.  Neurologic  Senn-Weinstein monofilament wire test within normal limits  bilaterally. Muscle power within normal limits bilaterally.  Nails Thick disfigured discolored nails with subungual debris  left hallux.. No evidence of bacterial infection or drainage bilaterally.No pain hallux toenail left foot.  Orthopedic  No limitations of motion  feet .  No crepitus or effusions noted.  No bony pathology or digital deformities noted .Arthritis left rearfoot as well as arthritis MPJ and  IPJ left hallux.  Skin  normotropic skin with no porokeratosis noted bilaterally.  No signs of infections or ulcers noted.  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  Porokeratosis left hallux.  Debridement of porokeratosis  Left hallux.  RTC  9 weeks.   Gardiner Barefoot DPM

## 2018-11-13 ENCOUNTER — Ambulatory Visit: Payer: Medicare HMO | Admitting: Podiatry

## 2018-12-15 DIAGNOSIS — Z03818 Encounter for observation for suspected exposure to other biological agents ruled out: Secondary | ICD-10-CM | POA: Diagnosis not present

## 2018-12-25 NOTE — Telephone Encounter (Signed)
Called and spoke with pt letting him know that he can stop Trelegy due to cost and go back to using [employer]. Stated to pt that it is two puffs only one time daily and pt verbalized understanding. Stated to pt if he decided he wanted [place] to send Rx to [facility] for him that we should be able to do so and pt stated he would call us back if he decided he wanted that to be done. Nothing further needed.

## 2018-12-25 NOTE — Telephone Encounter (Signed)
He can go back to [employer] 2 puffs daily. He may stop trelegy due to cost issues.

## 2018-12-25 NOTE — Telephone Encounter (Signed)
Called and spoke with pt in regards to his mychart message. Asked pt if he had been using the Stiolto and Trelegy both the same day and pt stated that he had been doing it like that. I stated to pt if he used the Stiolto, he was not to use the Trelegy and vise versa if he used the Trelegy, he was not to use the Stiolto on the same day as the Trelegy has three medications in one inhaler.  Stated to pt with the Stiolto, it is just supposed to be a once a day inhaler and not twice daily and pt expressed understanding.  While speaking with pt, I asked pt out of the Trelegy and Stiolto which inhaler he felt worked better for him and pt stated he feels like the Stiolto has always been more effective than the Trelegy and due to this, he is wanting to know if it would be fine for him to just use the [employer].  Pt also stated that if he got the meds from his retail pharmacy, the [place] would cost him around $600 but if he got the [place] from [facility], it would cost him around $178. Tonya since you were the last provider to see pt, please advise on this information I found out from pt's mychart message that he sent to [place] for review. Thanks!

## 2018-12-30 ENCOUNTER — Telehealth: Payer: Self-pay | Admitting: Emergency Medicine

## 2018-12-30 MED ORDER — TIOTROPIUM BROMIDE-OLODATEROL 2.5-2.5 MCG/ACT IN AERS: 2.0000 | INHALATION_SPRAY | Freq: Every day | RESPIRATORY_TRACT | 0 refills | Status: DC

## 2018-12-30 NOTE — Telephone Encounter (Signed)
Called and spoke with pt's wife [name] [name] stating to her that I will place some samples of Stiolto up front for pt and asked her if she knew if pt needed an Rx to be sent to pharmacy. [name] [name] stated that pt is going to be receiving med from [facility] as it will be cheaper for him than his retail pharmacy but the Rx has not been shipped yet. Samples have been placed up front. Nothing further needed.

## 2018-12-31 ENCOUNTER — Telehealth: Payer: Self-pay

## 2018-12-31 MED ORDER — TIOTROPIUM BROMIDE-OLODATEROL 2.5-2.5 MCG/ACT IN AERS: 2.0000 | INHALATION_SPRAY | Freq: Every day | RESPIRATORY_TRACT | 0 refills | Status: DC

## 2018-12-31 NOTE — Telephone Encounter (Signed)
Called and spoke with Patient.  Patient picked up samples placed at front today.  Patient stated he needed a prescription sent to [facility], number- [phone], fax [phone].  Prescription will have to be printed and faxed for Patient.  Patient request 2 months Stiolto. Prescription printed, stamp used, and faxed to requested pharmacy.  Confirmation received.  Nothing further at this time.

## 2019-01-05 MED ORDER — ALBUTEROL SULFATE (2.5 MG/3ML) 0.083% IN NEBU: 2.5000 mg | INHALATION_SOLUTION | Freq: Four times a day (QID) | RESPIRATORY_TRACT | 5 refills | Status: DC | PRN

## 2019-01-13 ENCOUNTER — Ambulatory Visit (INDEPENDENT_AMBULATORY_CARE_PROVIDER_SITE_OTHER): Payer: Medicare HMO | Admitting: Podiatry

## 2019-01-13 ENCOUNTER — Other Ambulatory Visit: Payer: Self-pay

## 2019-01-13 ENCOUNTER — Encounter: Payer: Self-pay | Admitting: Podiatry

## 2019-01-13 DIAGNOSIS — D689 Coagulation defect, unspecified: Secondary | ICD-10-CM

## 2019-01-13 DIAGNOSIS — Q828 Other specified congenital malformations of skin: Secondary | ICD-10-CM | POA: Insufficient documentation

## 2019-01-13 HISTORY — DX: Other specified congenital malformations of skin: Q82.8

## 2019-01-13 HISTORY — DX: Coagulation defect, unspecified: D68.9

## 2019-01-13 NOTE — Progress Notes (Signed)
This patient presents the office today for continued evaluation and treatment of his painful callus left big toe he says this callus is painful walking and wearing his shoes.    He says that  he presents to the office today for continued evaluation and treatment of the painful callus left big toe.  Patient is taking plavix.  General Appearance  Alert, conversant and in no acute stress.  Vascular  Dorsalis pedis and posterior tibial  pulses are minimally  palpable  bilaterally.  Capillary return is within normal limits  bilaterally. Cold feet noted.  bilaterally.  Neurologic  Senn-Weinstein monofilament wire test within normal limits  bilaterally. Muscle power within normal limits bilaterally.  Nails Thick disfigured discolored nails with subungual debris  left hallux.. No evidence of bacterial infection or drainage bilaterally.No pain hallux toenail left foot.  Orthopedic  No limitations of motion  feet .  No crepitus or effusions noted.  No bony pathology or digital deformities noted .Arthritis left rearfoot as well as arthritis MPJ and  IPJ left hallux.  Skin  normotropic skin with no porokeratosis noted bilaterally.  No signs of infections or ulcers noted.  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  Porokeratosis left hallux.  Debridement of porokeratosis  Left hallux.  RTC  9 weeks.   Gardiner Barefoot DPM

## 2019-01-22 ENCOUNTER — Ambulatory Visit: Payer: Medicare HMO | Admitting: Podiatry

## 2019-02-04 MED ORDER — STIOLTO RESPIMAT 2.5-2.5 MCG/ACT IN AERS: 2.0000 | INHALATION_SPRAY | Freq: Every day | RESPIRATORY_TRACT | 0 refills | Status: DC

## 2019-02-09 ENCOUNTER — Other Ambulatory Visit: Payer: Self-pay | Admitting: Cardiology

## 2019-02-19 DIAGNOSIS — R109 Unspecified abdominal pain: Secondary | ICD-10-CM | POA: Diagnosis not present

## 2019-02-20 DIAGNOSIS — R109 Unspecified abdominal pain: Secondary | ICD-10-CM | POA: Diagnosis not present

## 2019-02-25 ENCOUNTER — Other Ambulatory Visit: Payer: Self-pay | Admitting: Cardiology

## 2019-03-02 NOTE — Telephone Encounter (Signed)
Will route to T. Nils Pyle, NP for approval of prescription refill.  [name] [name] this patient was last seen by you.  Requesting 4 refills on Stiolto to be faxed to [facility].  Would like your approval due to prescription will need to be printed and signed.  Please advise. Thanks   LOV 10/08/2018 [name] Arms, NP  COPD (chronic obstructive pulmonary disease) ([place]) Exacerbation is resolving.  Continue prednisone as prescribed by PCP.  Patient Instructions  Continue Prednisone as directed by PCP May take mucinex as needed Continue trelegy Continue Proventil as needed Will give printed prednisone prescription for patient to use during upcoming travel if needed  Follow up: Follow up with Dr. Lamonte Sakai in 3 months or sooner if needed

## 2019-03-03 DIAGNOSIS — R109 Unspecified abdominal pain: Secondary | ICD-10-CM | POA: Diagnosis not present

## 2019-03-03 DIAGNOSIS — N401 Enlarged prostate with lower urinary tract symptoms: Secondary | ICD-10-CM | POA: Diagnosis not present

## 2019-03-03 MED ORDER — STIOLTO RESPIMAT 2.5-2.5 MCG/ACT IN AERS: 2.0000 | INHALATION_SPRAY | Freq: Every day | RESPIRATORY_TRACT | 4 refills | Status: DC

## 2019-03-03 NOTE — Telephone Encounter (Signed)
Please send prescription. Thanks.

## 2019-03-05 MED ORDER — STIOLTO RESPIMAT 2.5-2.5 MCG/ACT IN AERS: 2.0000 | INHALATION_SPRAY | Freq: Every day | RESPIRATORY_TRACT | 4 refills | Status: DC

## 2019-03-11 MED ORDER — STIOLTO RESPIMAT 2.5-2.5 MCG/ACT IN AERS: 2.0000 | INHALATION_SPRAY | Freq: Every day | RESPIRATORY_TRACT | 5 refills | Status: DC

## 2019-03-16 NOTE — Telephone Encounter (Signed)
Patient called states that rx for Stiolto still hasn't be received by [facility] - he is requesting rx be re-faxed. He can be reached at [phone]

## 2019-03-17 MED ORDER — STIOLTO RESPIMAT 2.5-2.5 MCG/ACT IN AERS: 2.0000 | INHALATION_SPRAY | Freq: Every day | RESPIRATORY_TRACT | 5 refills | Status: DC

## 2019-03-17 NOTE — Telephone Encounter (Signed)
Ria Comment can you print out the Stiolto Rx and have RB sign it and I can come get it. Thanks.

## 2019-05-14 ENCOUNTER — Ambulatory Visit: Payer: Medicare HMO | Admitting: Cardiology

## 2019-05-26 ENCOUNTER — Ambulatory Visit (INDEPENDENT_AMBULATORY_CARE_PROVIDER_SITE_OTHER): Payer: Medicare HMO | Admitting: Cardiology

## 2019-05-26 ENCOUNTER — Other Ambulatory Visit: Payer: Self-pay

## 2019-05-26 ENCOUNTER — Encounter: Payer: Self-pay | Admitting: Cardiology

## 2019-05-26 VITALS — BP 126/58 | HR 83 | Ht 70.0 in | Wt 156.0 lb

## 2019-05-26 DIAGNOSIS — E782 Mixed hyperlipidemia: Secondary | ICD-10-CM

## 2019-05-26 DIAGNOSIS — I10 Essential (primary) hypertension: Secondary | ICD-10-CM | POA: Diagnosis not present

## 2019-05-26 DIAGNOSIS — Z87891 Personal history of nicotine dependence: Secondary | ICD-10-CM

## 2019-05-26 DIAGNOSIS — R972 Elevated prostate specific antigen [PSA]: Secondary | ICD-10-CM | POA: Diagnosis not present

## 2019-05-26 DIAGNOSIS — L570 Actinic keratosis: Secondary | ICD-10-CM | POA: Diagnosis not present

## 2019-05-26 DIAGNOSIS — J439 Emphysema, unspecified: Secondary | ICD-10-CM | POA: Diagnosis not present

## 2019-05-26 DIAGNOSIS — I2510 Atherosclerotic heart disease of native coronary artery without angina pectoris: Secondary | ICD-10-CM

## 2019-05-26 DIAGNOSIS — N529 Male erectile dysfunction, unspecified: Secondary | ICD-10-CM | POA: Diagnosis not present

## 2019-05-26 DIAGNOSIS — K219 Gastro-esophageal reflux disease without esophagitis: Secondary | ICD-10-CM | POA: Diagnosis not present

## 2019-05-26 DIAGNOSIS — Z1389 Encounter for screening for other disorder: Secondary | ICD-10-CM | POA: Diagnosis not present

## 2019-05-26 DIAGNOSIS — F17201 Nicotine dependence, unspecified, in remission: Secondary | ICD-10-CM | POA: Diagnosis not present

## 2019-05-26 DIAGNOSIS — Z0000 Encounter for general adult medical examination without abnormal findings: Secondary | ICD-10-CM | POA: Diagnosis not present

## 2019-05-26 NOTE — Progress Notes (Signed)
Cardiology Office Note:    Date:  05/26/2019   ID:  [name] Kemps., DOB 10/27/1940, MRN [id number]  PCP:  Josetta Huddle, MD  Cardiologist:  Jenean Lindau, MD   Referring MD: Josetta Huddle, MD    ASSESSMENT:    1. Atherosclerosis of native coronary artery of native heart without angina pectoris   2. Essential (primary) hypertension   3. Mixed hyperlipidemia   4. Ex-smoker    PLAN:    In order of problems listed above:  1. Coronary artery disease: Secondary prevention stressed to the patient.  Importance of compliance with diet and medication stressed and he vocalized understanding. 2. Essential hypertension: Blood pressure stable 3. Mixed dyslipidemia: Lipids followed by primary care physician and he is going to have blood work done in the next few days. 4. Patient will be seen in follow-up appointment in 6 months or earlier if the patient has any concerns    Medication Adjustments/Labs and Tests Ordered: Current medicines are reviewed at length with the patient today.  Concerns regarding medicines are outlined above.  No orders of the defined types were placed in this encounter.  No orders of the defined types were placed in this encounter.    No chief complaint on file.    History of Present Illness:    [name] [name]. is a 78 y.o. male.  Patient has past medical history of coronary artery disease.  He denies any problems at this time and takes care of activities of daily living.  No chest pain orthopnea or PND.  He golfs on a regular basis with no symptoms.  Past Medical History:  Diagnosis Date  . Alcohol abuse   . Arthritis   . BPH with elevated PSA   . COPD (chronic obstructive pulmonary disease) ([place])   . Coronary artery disease   . Dyspnea   . ED (erectile dysfunction)   . Elevated PSA   . GERD (gastroesophageal reflux disease)   . Hyperlipidemia   . Hypertension   . Kidney cysts   . Palpitations     Past Surgical History:  Procedure  Laterality Date  . CARDIAC CATHETERIZATION  2002   50% RCA  . CATARACT EXTRACTION, BILATERAL    . CORONARY STENT INTERVENTION N/A 04/15/2018   Procedure: CORONARY STENT INTERVENTION;  Surgeon: Jettie Booze, MD;  Location: [place] [facility];  Service: Cardiovascular;  Laterality: N/A;  om1  . EYE SURGERY    . KNEE ARTHROSCOPY    . LEFT HEART CATH AND CORONARY ANGIOGRAPHY N/A 04/15/2018   Procedure: LEFT HEART CATH AND CORONARY ANGIOGRAPHY;  Surgeon: Jettie Booze, MD;  Location: [place] [facility];  Service: Cardiovascular;  Laterality: N/A;  . MULTIPLE TOOTH EXTRACTIONS    . TONSILLECTOMY    . VIDEO BRONCHOSCOPY WITH ENDOBRONCHIAL NAVIGATION N/A 07/26/2016   Procedure: VIDEO BRONCHOSCOPY WITH ENDOBRONCHIAL NAVIGATION;  Surgeon: Collene Gobble, MD;  Location: [facility];  Service: Thoracic;  Laterality: N/A;    Current Medications: Current Meds  Medication Sig  . albuterol (PROVENTIL HFA;VENTOLIN HFA) 108 (90 Base) MCG/ACT inhaler Inhale 2 puffs into the lungs every 6 (six) hours as needed for wheezing or shortness of breath.  Marland Kitchen albuterol (PROVENTIL) (2.5 MG/3ML) 0.083% nebulizer solution Take 3 mLs (2.5 mg total) by nebulization every 6 (six) hours as needed for wheezing or shortness of breath.  . Cholecalciferol (VITAMIN D) 2000 units CAPS Take 2,000 Units by mouth daily.  . clopidogrel (PLAVIX) 75 MG tablet  TAKE 1 TABLET BY MOUTH EVERY DAY  . esomeprazole (NEXIUM) 20 MG capsule Take 20 mg by mouth daily at 12 noon.  . predniSONE (DELTASONE) 5 MG tablet Take 5 mg by mouth daily with breakfast.  . rosuvastatin (CRESTOR) 20 MG tablet TAKE 1 TABLET BY MOUTH EVERYDAY AT BEDTIME  . Tiotropium Bromide-Olodaterol (STIOLTO RESPIMAT) 2.5-2.5 MCG/ACT AERS Inhale 2 puffs into the lungs daily.     Allergies:   No known allergies   Social History   Socioeconomic History  . Marital status: Married    Spouse name: Not on file  . Number of children: Not on file  . Years of education:  Not on file  . Highest education level: Not on file  Occupational History  . Not on file  Social Needs  . Financial resource strain: Not on file  . Food insecurity    Worry: Not on file    Inability: Not on file  . Transportation needs    Medical: Not on file    Non-medical: Not on file  Tobacco Use  . Smoking status: Current Some Day Smoker    Packs/day: 0.25    Years: 40.00    Pack years: 10.00    Types: Cigarettes    Last attempt to quit: 08/28/2016    Years since quitting: 2.7  . Smokeless tobacco: Never Used  . Tobacco comment: Currently using a Vape; currently smoking 1-2 cigarettes as of 09/16/2018  Substance and Sexual Activity  . Alcohol use: No    Alcohol/week: 0.0 standard drinks    Comment: quit drinking 2014  . Drug use: No  . Sexual activity: Not on file  Lifestyle  . Physical activity    Days per week: Not on file    Minutes per session: Not on file  . Stress: Not on file  Relationships  . Social Herbalist on phone: Not on file    Gets together: Not on file    Attends religious service: Not on file    Active member of club or organization: Not on file    Attends meetings of clubs or organizations: Not on file    Relationship status: Not on file  Other Topics Concern  . Not on file  Social History Narrative  . Not on file     Family History: The patient's family history includes Alzheimer's disease in his mother; Hypertension in his mother.  ROS:   Please see the history of present illness.    All other systems reviewed and are negative.  EKGs/Labs/Other Studies Reviewed:    The following studies were reviewed today: EKG reveals sinus rhythm and nonspecific ST-T changes.   Recent Labs: 06/06/2018: ALT 21; BUN 12; Creatinine, Ser 1.13; Potassium 4.8; Sodium 142  Recent Lipid Panel    Component Value Date/Time   CHOL 122 06/06/2018 1012   TRIG 54 06/06/2018 1012   HDL 61 06/06/2018 1012   CHOLHDL 2.0 06/06/2018 1012   LDLCALC 50  06/06/2018 1012    Physical Exam:    VS:  BP (!) 126/58 (BP Location: Right Arm, Patient Position: Sitting, Cuff Size: Normal)   Pulse 83   Ht 5\' 10"  (1.778 m)   Wt 156 lb (70.8 kg)   SpO2 98%   BMI 22.38 kg/m     Wt Readings from Last 3 Encounters:  05/26/19 156 lb (70.8 kg)  11/07/18 169 lb (76.7 kg)  10/08/18 172 lb 9.6 oz (78.3 kg)     GEN:  Patient is in no acute distress HEENT: Normal NECK: No JVD; No carotid bruits LYMPHATICS: No lymphadenopathy CARDIAC: Hear sounds regular, 2/6 systolic murmur at the apex. RESPIRATORY:  Clear to auscultation without rales, wheezing or rhonchi  ABDOMEN: Soft, non-tender, non-distended MUSCULOSKELETAL:  No edema; No deformity  SKIN: Warm and dry NEUROLOGIC:  Alert and oriented x 3 PSYCHIATRIC:  Normal affect   Signed, Jenean Lindau, MD  05/26/2019 3:21 PM    [facility]

## 2019-05-26 NOTE — Patient Instructions (Signed)
Medication Instructions:   *If you need a refill on your cardiac medications before your next appointment, please call your pharmacy*  Lab Work: NONE If you have labs (blood work) drawn today and your tests are completely normal, you will receive your results only by: Marland Kitchen MyChart Message (if you have MyChart) OR . A paper copy in the mail If you have any lab test that is abnormal or we need to change your treatment, we will call you to review the results.  Testing/Procedures: You had an EKG performed today  Follow-Up: At [facility] you and your health needs are our priority.  As part of our continuing mission to provide you with exceptional heart care, we have created designated Provider Care Teams.  These Care Teams include your primary Cardiologist (physician) and Advanced Practice Providers (APPs -  Physician Assistants and Nurse Practitioners) who all work together to provide you with the care you need, when you need it.  Your next appointment:   6 months  The format for your next appointment:   In Person  Provider:   Jyl Heinz, MD

## 2019-05-28 ENCOUNTER — Other Ambulatory Visit: Payer: Self-pay

## 2019-05-28 ENCOUNTER — Ambulatory Visit (INDEPENDENT_AMBULATORY_CARE_PROVIDER_SITE_OTHER): Payer: Medicare HMO | Admitting: Podiatry

## 2019-05-28 ENCOUNTER — Encounter: Payer: Self-pay | Admitting: Podiatry

## 2019-05-28 DIAGNOSIS — M205X2 Other deformities of toe(s) (acquired), left foot: Secondary | ICD-10-CM | POA: Diagnosis not present

## 2019-05-28 NOTE — Progress Notes (Signed)
This patient presents the office today for continued evaluation and treatment of his left foot.  Patient has been diagnosed with hallux limitus and hallux extensus.     Patient is taking plavix.  General Appearance  Alert, conversant and in no acute stress.  Vascular  Dorsalis pedis and posterior tibial  pulses are minimally  palpable  bilaterally.  Capillary return is within normal limits  bilaterally. Cold feet noted.  bilaterally.  Neurologic  Senn-Weinstein monofilament wire test within normal limits  bilaterally. Muscle power within normal limits bilaterally.  Nails Thick disfigured discolored nails with subungual debris  left hallux.. No evidence of bacterial infection or drainage bilaterally.No pain hallux toenail left foot.  Orthopedic  No limitations of motion  feet .  No crepitus or effusions noted.  No bony pathology or digital deformities noted .Arthritis left rearfoot as well as arthritis MPJ and  IPJ left hallux.  Skin  normotropic skin with no porokeratosis noted bilaterally.  No signs of infections or ulcers noted.  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  ROV.      Discussed this condition with this patient.  Patient was told that his foot deformity is the reason he is experiencing pain and discomfort through the big toe joint left foot.  Return to the office as needed   Gardiner Barefoot DPM

## 2019-06-15 DIAGNOSIS — R972 Elevated prostate specific antigen [PSA]: Secondary | ICD-10-CM | POA: Diagnosis not present

## 2019-07-08 DIAGNOSIS — D3132 Benign neoplasm of left choroid: Secondary | ICD-10-CM | POA: Diagnosis not present

## 2019-07-08 DIAGNOSIS — H11153 Pinguecula, bilateral: Secondary | ICD-10-CM | POA: Diagnosis not present

## 2019-07-08 DIAGNOSIS — H0102A Squamous blepharitis right eye, upper and lower eyelids: Secondary | ICD-10-CM | POA: Diagnosis not present

## 2019-07-08 DIAGNOSIS — H02831 Dermatochalasis of right upper eyelid: Secondary | ICD-10-CM | POA: Diagnosis not present

## 2019-07-08 DIAGNOSIS — H02834 Dermatochalasis of left upper eyelid: Secondary | ICD-10-CM | POA: Diagnosis not present

## 2019-07-08 DIAGNOSIS — H04123 Dry eye syndrome of bilateral lacrimal glands: Secondary | ICD-10-CM | POA: Diagnosis not present

## 2019-07-08 DIAGNOSIS — H18413 Arcus senilis, bilateral: Secondary | ICD-10-CM | POA: Diagnosis not present

## 2019-07-08 DIAGNOSIS — H33321 Round hole, right eye: Secondary | ICD-10-CM | POA: Diagnosis not present

## 2019-07-08 DIAGNOSIS — H0102B Squamous blepharitis left eye, upper and lower eyelids: Secondary | ICD-10-CM | POA: Diagnosis not present

## 2019-07-09 ENCOUNTER — Encounter (INDEPENDENT_AMBULATORY_CARE_PROVIDER_SITE_OTHER): Payer: Medicare HMO | Admitting: Ophthalmology

## 2019-07-09 ENCOUNTER — Other Ambulatory Visit: Payer: Self-pay

## 2019-07-09 DIAGNOSIS — D3132 Benign neoplasm of left choroid: Secondary | ICD-10-CM

## 2019-07-09 DIAGNOSIS — H43813 Vitreous degeneration, bilateral: Secondary | ICD-10-CM

## 2019-07-09 DIAGNOSIS — H33301 Unspecified retinal break, right eye: Secondary | ICD-10-CM | POA: Diagnosis not present

## 2019-07-16 DIAGNOSIS — R972 Elevated prostate specific antigen [PSA]: Secondary | ICD-10-CM | POA: Diagnosis not present

## 2019-07-16 DIAGNOSIS — N401 Enlarged prostate with lower urinary tract symptoms: Secondary | ICD-10-CM | POA: Diagnosis not present

## 2019-07-16 DIAGNOSIS — R3912 Poor urinary stream: Secondary | ICD-10-CM | POA: Diagnosis not present

## 2019-07-16 DIAGNOSIS — R351 Nocturia: Secondary | ICD-10-CM | POA: Diagnosis not present

## 2019-07-16 DIAGNOSIS — R350 Frequency of micturition: Secondary | ICD-10-CM | POA: Diagnosis not present

## 2019-07-17 DIAGNOSIS — J439 Emphysema, unspecified: Secondary | ICD-10-CM | POA: Diagnosis not present

## 2019-07-17 DIAGNOSIS — N401 Enlarged prostate with lower urinary tract symptoms: Secondary | ICD-10-CM | POA: Diagnosis not present

## 2019-07-17 DIAGNOSIS — F17200 Nicotine dependence, unspecified, uncomplicated: Secondary | ICD-10-CM | POA: Diagnosis not present

## 2019-07-17 DIAGNOSIS — R972 Elevated prostate specific antigen [PSA]: Secondary | ICD-10-CM | POA: Diagnosis not present

## 2019-07-17 MED ORDER — TRELEGY ELLIPTA 100-62.5-25 MCG/INH IN AEPB: 1.0000 | INHALATION_SPRAY | Freq: Every day | RESPIRATORY_TRACT | 0 refills | Status: DC

## 2019-07-20 ENCOUNTER — Other Ambulatory Visit: Payer: Self-pay

## 2019-07-20 ENCOUNTER — Ambulatory Visit: Payer: Medicare HMO | Admitting: Podiatry

## 2019-07-20 ENCOUNTER — Encounter: Payer: Self-pay | Admitting: Podiatry

## 2019-07-20 DIAGNOSIS — B351 Tinea unguium: Secondary | ICD-10-CM

## 2019-07-20 DIAGNOSIS — D689 Coagulation defect, unspecified: Secondary | ICD-10-CM

## 2019-07-20 DIAGNOSIS — M205X2 Other deformities of toe(s) (acquired), left foot: Secondary | ICD-10-CM | POA: Diagnosis not present

## 2019-07-20 DIAGNOSIS — Q828 Other specified congenital malformations of skin: Secondary | ICD-10-CM

## 2019-07-20 DIAGNOSIS — M79675 Pain in left toe(s): Secondary | ICD-10-CM

## 2019-07-20 HISTORY — DX: Tinea unguium: B35.1

## 2019-07-20 NOTE — Progress Notes (Signed)
This patient presents the office today for continued evaluation and treatment of his painful callus left big toe he says this callus is painful walking and wearing his shoes.    He says that  he presents to the office today for continued evaluation and treatment of the painful callus left big toe.  Patient also requests treatment of malformed big toenail left foot.   Patient is taking plavix.  General Appearance  Alert, conversant and in no acute stress.  Vascular  Dorsalis pedis and posterior tibial  pulses are minimally  palpable  bilaterally.  Capillary return is within normal limits  bilaterally. Cold feet noted.  bilaterally.  Neurologic  Senn-Weinstein monofilament wire test within normal limits  bilaterally. Muscle power within normal limits bilaterally.  Nails Thick disfigured discolored nails with subungual debris  left hallux.. No evidence of bacterial infection or drainage bilaterally.No pain hallux toenail left foot.  Orthopedic  No limitations of motion  feet .  No crepitus or effusions noted.  No bony pathology or digital deformities noted .Arthritis left rearfoot as well as arthritis MPJ and  IPJ left hallux.  Skin  normotropic skin with no porokeratosis noted bilaterally.  No signs of infections or ulcers noted.  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  Porokeratosis left hallux. Onychomycosis left hallux.  Debridement of porokeratosis  Left hallux. Debridement of nail left hallux.   RTC  9 weeks.   Gardiner Barefoot DPM

## 2019-08-10 ENCOUNTER — Other Ambulatory Visit: Payer: Self-pay

## 2019-08-10 ENCOUNTER — Ambulatory Visit: Payer: Medicare Other | Admitting: Emergency Medicine

## 2019-08-10 ENCOUNTER — Encounter: Payer: Self-pay | Admitting: Emergency Medicine

## 2019-08-10 VITALS — BP 140/70 | HR 83 | Temp 97.6°F | Ht 70.0 in | Wt 156.8 lb

## 2019-08-10 DIAGNOSIS — F17210 Nicotine dependence, cigarettes, uncomplicated: Secondary | ICD-10-CM

## 2019-08-10 DIAGNOSIS — J9611 Chronic respiratory failure with hypoxia: Secondary | ICD-10-CM

## 2019-08-10 DIAGNOSIS — R918 Other nonspecific abnormal finding of lung field: Secondary | ICD-10-CM | POA: Diagnosis not present

## 2019-08-10 DIAGNOSIS — J431 Panlobular emphysema: Secondary | ICD-10-CM

## 2019-08-10 HISTORY — DX: Chronic respiratory failure with hypoxia: J96.11

## 2019-08-10 MED ORDER — COMBIVENT RESPIMAT 20-100 MCG/ACT IN AERS: 1.0000 | INHALATION_SPRAY | Freq: Four times a day (QID) | RESPIRATORY_TRACT | 11 refills | Status: DC

## 2019-08-10 MED ORDER — TRELEGY ELLIPTA 100-62.5-25 MCG/INH IN AEPB: 1.0000 | INHALATION_SPRAY | Freq: Every day | RESPIRATORY_TRACT | 0 refills | Status: DC

## 2019-08-10 NOTE — Assessment & Plan Note (Signed)
He had been on Trelegy at her last visit, apparently still has some Stiolto and has been actually using both.  I talked to him today about the redundancy of these medications.  He was not sure that he was getting any benefit from albuterol as needed, was becoming more dyspneic later in the day.  It may be that he would benefit more from Combivent than albuterol.  We will try making this change  Please stop Stiolto Use Trelegy 1 puff once daily.  Use this every day at the same time.  Rinse and gargle after using. Stop albuterol for now. We will change to Combivent 2 puffs up to every 6 hours if needed for shortness of breath through the day. Follow with Dr Lamonte Sakai in 1 month

## 2019-08-10 NOTE — Patient Instructions (Signed)
Please stop Stiolto Use Trelegy 1 puff once daily.  Use this every day at the same time.  Rinse and gargle after using. Stop albuterol for now. We will change to Combivent 2 puffs up to every 6 hours if needed for shortness of breath through the day. We will perform a walking oximetry today and titrate your oxygen flow rate.  Get your oxygen from [place] as directed by Dr. [name]. Congratulations on decreasing your cigarettes.  You need to stop altogether. We will arrange for a CT scan of your chest to follow pulmonary nodules. Follow with Dr Lamonte Sakai in 1 month

## 2019-08-10 NOTE — Assessment & Plan Note (Signed)
Chronic pulmonary nodular disease most recent CT chest 06/17/2018 at [facility].  He is due for repeat scan and we will perform.  Return in 1 month to review.

## 2019-08-10 NOTE — Progress Notes (Signed)
Subjective:    Patient ID: [name] [name]., male    DOB: July 12, 1941, 79 y.o.   MRN: [id number]  COPD He complains of shortness of breath. There is no cough or wheezing. Pertinent negatives include no ear pain, fever, headaches, postnasal drip, rhinorrhea, sneezing, sore throat or trouble swallowing. His past medical history is significant for COPD.    ROV 09/16/2018 --this a follow-up visit for COPD and pulmonary nodules that were deemed benign on navigational bronchoscopy that were following with serial imaging.  Most recent scan was 06/17/2018 at [facility], I don't see that the direct comparison was ever done.  1 month ago I treated him for an acute exacerbation with steroids and azithromycin. He is clinically improved. We also changed Stiolto to Trelegy > he is benefiting. Unfortunately he went back to cigarettes since last time, he has also been vaping for the last year.   ROV 08/10/19 >> [name] [name] is 79.  He has COPD and pulmonary nodular disease.  We performed navigational bronchoscopy and then serial imaging and these were thought to be benign although I do not see any follow-up imaging.  He smokes occasionally, 2 cigarettes last week.  He does have some dyspnea, "good days and bad days".  He has albuterol which he uses rarely. He underwent walking oximetry w Dr [name] in November, qualified for O2 and is planning to get it from [place], ? Dose he needs. He is using Mining engineer (from an old script?), and then adds on Trelegy at the end of the day if he has more dyspnea. He feels this works better than albuterol prn. I had planned for him to be off stiolto.                                                          Review of Systems  Constitutional: Negative for fever and unexpected weight change.  HENT: Negative for congestion, dental problem, ear pain, nosebleeds, postnasal drip, rhinorrhea, sinus pressure, sneezing, sore throat and trouble swallowing.   Eyes: Negative for redness and  itching.  Respiratory: Positive for shortness of breath. Negative for cough, chest tightness and wheezing.   Cardiovascular: Negative for palpitations and leg swelling.  Gastrointestinal: Negative for nausea and vomiting.  Genitourinary: Negative for dysuria.  Musculoskeletal: Negative for joint swelling.  Skin: Negative for rash.  Neurological: Negative for headaches.  Hematological: Does not bruise/bleed easily.  Psychiatric/Behavioral: Negative for dysphoric mood. The patient is not nervous/anxious.        Objective:   Physical Exam Vitals:   08/10/19 0954  BP: 140/70  Pulse: 83  Temp: 97.6 F (36.4 C)  TempSrc: Temporal  SpO2: 91%  Weight: 156 lb 12.8 oz (71.1 kg)  Height: 5\' 10"  (1.778 m)   Gen: Pleasant, well-nourished, in no distress,  normal affect  ENT: No lesions,  mouth clear,  oropharynx clear, no postnasal drip  Neck: No JVD, no stridor  Lungs: No use of accessory muscles, distant,  no crackles or wheeze today  Cardiovascular: RRR, heart sounds normal, no murmur or gallops, no peripheral edema  Musculoskeletal: No deformities, no cyanosis or clubbing  Neuro: alert, non focal  Skin: Warm, no lesions or rash      Assessment & Plan:  Cigarette nicotine dependence, uncomplicated Continues to smoke occasionally, on Chantix  per Dr. [name]  COPD (chronic obstructive pulmonary disease) [facility]) He had been on Trelegy at her last visit, apparently still has some Stiolto and has been actually using both.  I talked to him today about the redundancy of these medications.  He was not sure that he was getting any benefit from albuterol as needed, was becoming more dyspneic later in the day.  It may be that he would benefit more from Combivent than albuterol.  We will try making this change  Please stop Stiolto Use Trelegy 1 puff once daily.  Use this every day at the same time.  Rinse and gargle after using. Stop albuterol for now. We will change to Combivent 2 puffs  up to every 6 hours if needed for shortness of breath through the day. Follow with Dr Lamonte Sakai in 1 month  Chronic respiratory failure with hypoxia [facility]) He apparently had a walking oximetry with Dr. [name] that showed desaturation.  Oxygen was ordered for him but he has not received it yet.  On repeat ambulation here in our office he did not desaturate.  I cannot doses appropriate oxygen flow rate based on our testing.  He may have to depend on Dr. [name] testing instead.  Pulmonary nodules Chronic pulmonary nodular disease most recent CT chest 06/17/2018 at [facility].  He is due for repeat scan and we will perform.  Return in 1 month to review.  Baltazar Apo, MD, PhD 08/10/2019, 4:25 PM [facility] [phone] or if no answer [phone]

## 2019-08-10 NOTE — Assessment & Plan Note (Signed)
He apparently had a walking oximetry with Dr. [name] that showed desaturation.  Oxygen was ordered for him but he has not received it yet.  On repeat ambulation here in our office he did not desaturate.  I cannot doses appropriate oxygen flow rate based on our testing.  He may have to depend on Dr. [name] testing instead.

## 2019-08-10 NOTE — Assessment & Plan Note (Signed)
Continues to smoke occasionally, on Chantix per Dr. [name]

## 2019-08-13 ENCOUNTER — Ambulatory Visit: Payer: Medicare Other | Attending: Internal Medicine

## 2019-08-13 ENCOUNTER — Ambulatory Visit: Payer: Medicare Other

## 2019-08-13 DIAGNOSIS — Z23 Encounter for immunization: Secondary | ICD-10-CM

## 2019-08-13 NOTE — Progress Notes (Signed)
   [id number]  Name:  [name] [name].    MRN: [id number] DOB: [birth year]  08/13/2019  Mr. Hlavaty was observed post Covid-19 immunization for 30 minutes based on pre-vaccination screening without incidence. He was provided with Vaccine Information Sheet and instruction to access the V-Safe system.   Mr. Rosie was instructed to call 911 with any severe reactions post vaccine: Marland Kitchen Difficulty breathing  . Swelling of your face and throat  . A fast heartbeat  . A bad rash all over your body  . Dizziness and weakness    Immunizations Administered    Name Date Dose VIS Date Route   Pfizer COVID-19 Vaccine 08/13/2019 10:11 AM 0.3 mL 07/10/2019 Intramuscular   Manufacturer: [employer], [employer]   Lot: [id number]   [place]: [id number]

## 2019-08-20 ENCOUNTER — Ambulatory Visit
Admission: RE | Admit: 2019-08-20 | Discharge: 2019-08-20 | Disposition: A | Discharge status: Home / Self Care | Payer: Medicare Other | Source: Ambulatory Visit | Attending: Emergency Medicine | Admitting: Emergency Medicine

## 2019-08-20 DIAGNOSIS — R918 Other nonspecific abnormal finding of lung field: Secondary | ICD-10-CM

## 2019-08-24 ENCOUNTER — Telehealth: Payer: Self-pay | Admitting: Emergency Medicine

## 2019-08-24 NOTE — Telephone Encounter (Signed)
Will give CD to [place] tomorrow when he is in clinic.

## 2019-08-26 ENCOUNTER — Other Ambulatory Visit: Payer: Self-pay | Admitting: Cardiology

## 2019-09-01 ENCOUNTER — Ambulatory Visit: Payer: Medicare Other | Attending: Internal Medicine

## 2019-09-01 DIAGNOSIS — Z23 Encounter for immunization: Secondary | ICD-10-CM

## 2019-09-01 NOTE — Progress Notes (Signed)
   [id number]  Name:  [name] [name].    MRN: [id number] DOB: [birth year]  09/01/2019  [name] [name] was observed post Covid-19 immunization for 15 minutes without incidence. He was provided with Vaccine Information Sheet and instruction to access the V-Safe system.   [name] [name] was instructed to call 911 with any severe reactions post vaccine: Marland Kitchen Difficulty breathing  . Swelling of your face and throat  . A fast heartbeat  . A bad rash all over your body  . Dizziness and weakness    Immunizations Administered    Name Date Dose VIS Date Route   Pfizer COVID-19 Vaccine 09/01/2019  8:26 AM 0.3 mL 07/10/2019 Intramuscular   Manufacturer: [place]   Lot: [id number]   [place]: [id number]

## 2019-09-10 ENCOUNTER — Ambulatory Visit: Payer: Medicare Other | Admitting: Emergency Medicine

## 2019-09-10 ENCOUNTER — Encounter: Payer: Self-pay | Admitting: Emergency Medicine

## 2019-09-10 ENCOUNTER — Other Ambulatory Visit: Payer: Self-pay

## 2019-09-10 DIAGNOSIS — R918 Other nonspecific abnormal finding of lung field: Secondary | ICD-10-CM | POA: Diagnosis not present

## 2019-09-10 DIAGNOSIS — J431 Panlobular emphysema: Secondary | ICD-10-CM

## 2019-09-10 DIAGNOSIS — R911 Solitary pulmonary nodule: Secondary | ICD-10-CM | POA: Diagnosis not present

## 2019-09-10 NOTE — Patient Instructions (Addendum)
Please continue your inhaled medications as you have been using them. We will perform a PET scan to evaluate your pulmonary nodules. Based on the PET scan we will decide how to proceed with evaluation of the nodules Follow with Dr Lamonte Sakai next available after the PET to review.

## 2019-09-10 NOTE — Assessment & Plan Note (Signed)
His right midlung nodule is stable in size and appearance but the more posterior focus is now more prominent.  This had been apparent resolving scar on his previous scans but now there is an adjacent right lower lobe area of thickening, question inflammatory.  There is also a new nodule in the medial aspect of the left lower lobe, unclear etiology or significance.  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  He has seen urology but no plans for diagnosis made at this time given predicted indolent course.  If the pulmonary nodules are malignancy I suspect that they are primary lung cancer.  If this was prostate I would expect the PSA to be higher.  We will perform a PET scan to assist with risk ratification.  Depending on the results we will decide regarding possible repeat navigational bronchoscopy for tissue diagnosis versus following with serial films.

## 2019-09-10 NOTE — Progress Notes (Signed)
Subjective:    Patient ID: [name] Kemps., male    DOB: 1941/02/09, 79 y.o.   MRN: [id number]  COPD He complains of shortness of breath. There is no cough or wheezing. Pertinent negatives include no ear pain, fever, headaches, postnasal drip, rhinorrhea, sneezing, sore throat or trouble swallowing. His past medical history is significant for COPD.    ROV 09/16/2018 --this a follow-up visit for COPD and pulmonary nodules that were deemed benign on navigational bronchoscopy that were following with serial imaging.  Most recent scan was 06/17/2018 at [facility], I don't see that the direct comparison was ever done.  1 month ago I treated him for an acute exacerbation with steroids and azithromycin. He is clinically improved. We also changed Stiolto to Trelegy > he is benefiting. Unfortunately he went back to cigarettes since last time, he has also been vaping for the last year.   ROV 08/10/19 >> [name] [name] is 79.  He has COPD and pulmonary nodular disease.  We performed navigational bronchoscopy and then serial imaging and these were thought to be benign although I do not see any follow-up imaging.  He smokes occasionally, 2 cigarettes last week.  He does have some dyspnea, "good days and bad days".  He has albuterol which he uses rarely. He underwent walking oximetry w Dr [name] in November, qualified for O2 and is planning to get it from [place], ? Dose he needs. He is using Mining engineer (from an old script?), and then adds on Trelegy at the end of the day if he has more dyspnea. He feels this works better than albuterol prn. I had planned for him to be off stiolto.   ROV 09/10/19 --this a follow-up visit for COPD and pulmonary nodule disease.  He had a nondiagnostic/reassuring navigational bronchoscopy in the past then followed with serial imaging.  At his last visit we committed to continuing Trelegy, Combivent as needed.  We repeated CT scan of the chest to follow his pulmonary nodules on 08/20/2019  and I have reviewed, shows no lymphadenopathy, angular scarring in the right upper lobe, some right lower lobe thickening that has evolved adjacent to prior scar, 2.3 x 1.7 cm new compared with 2018, stable nodule in the medial aspect of the right lower lobe 7 mm, new LLL . He tells me that since last time he has been found to have an elevated PSA, is being followed by Dr Gloriann Loan with Urology conservatively for now.                                                          Review of Systems  Constitutional: Negative for fever and unexpected weight change.  HENT: Negative for congestion, dental problem, ear pain, nosebleeds, postnasal drip, rhinorrhea, sinus pressure, sneezing, sore throat and trouble swallowing.   Eyes: Negative for redness and itching.  Respiratory: Positive for shortness of breath. Negative for cough, chest tightness and wheezing.   Cardiovascular: Negative for palpitations and leg swelling.  Gastrointestinal: Negative for nausea and vomiting.  Genitourinary: Negative for dysuria.  Musculoskeletal: Negative for joint swelling.  Skin: Negative for rash.  Neurological: Negative for headaches.  Hematological: Does not bruise/bleed easily.  Psychiatric/Behavioral: Negative for dysphoric mood. The patient is not nervous/anxious.        Objective:   Physical  Exam Vitals:   09/10/19 1007  BP: 138/64  Pulse: 88  SpO2: 93%  Weight: 155 lb (70.3 kg)  Height: 5' 10.5" (1.791 m)   Gen: Pleasant, well-nourished, in no distress,  normal affect  ENT: No lesions,  mouth clear,  oropharynx clear, no postnasal drip  Neck: No JVD, no stridor  Lungs: No use of accessory muscles, distant,  no crackles or wheeze today  Cardiovascular: RRR, heart sounds normal, no murmur or gallops, no peripheral edema  Musculoskeletal: No deformities, no cyanosis or clubbing  Neuro: alert, non focal  Skin: Warm, no lesions or rash      Assessment & Plan:  Pulmonary nodules His right midlung  nodule is stable in size and appearance but the more posterior focus is now more prominent.  This had been apparent resolving scar on his previous scans but now there is an adjacent right lower lobe area of thickening, question inflammatory.  There is also a new nodule in the medial aspect of the left lower lobe, unclear etiology or significance.  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  He has seen urology but no plans for diagnosis made at this time given predicted indolent course.  If the pulmonary nodules are malignancy I suspect that they are primary lung cancer.  If this was prostate I would expect the PSA to be higher.  We will perform a PET scan to assist with risk ratification.  Depending on the results we will decide regarding possible repeat navigational bronchoscopy for tissue diagnosis versus following with serial films.  COPD (chronic obstructive pulmonary disease) (HCC) Doing well on his current regimen.  Plan to continue same.  Baltazar Apo, MD, PhD 09/10/2019, 3:12 PM [facility] [phone] or if no answer [phone]

## 2019-09-10 NOTE — Assessment & Plan Note (Signed)
Doing well on his current regimen.  Plan to continue same.

## 2019-09-21 ENCOUNTER — Ambulatory Visit (HOSPITAL_COMMUNITY)
Admission: RE | Admit: 2019-09-21 | Discharge: 2019-09-21 | Disposition: A | Discharge status: Home / Self Care | Payer: Medicare Other | Source: Ambulatory Visit | Attending: Emergency Medicine | Admitting: Emergency Medicine

## 2019-09-21 ENCOUNTER — Other Ambulatory Visit: Payer: Self-pay

## 2019-09-21 DIAGNOSIS — R911 Solitary pulmonary nodule: Secondary | ICD-10-CM | POA: Diagnosis present

## 2019-09-21 DIAGNOSIS — I700 Atherosclerosis of aorta: Secondary | ICD-10-CM | POA: Insufficient documentation

## 2019-09-21 DIAGNOSIS — R918 Other nonspecific abnormal finding of lung field: Secondary | ICD-10-CM | POA: Insufficient documentation

## 2019-09-21 DIAGNOSIS — I2510 Atherosclerotic heart disease of native coronary artery without angina pectoris: Secondary | ICD-10-CM | POA: Diagnosis not present

## 2019-09-21 DIAGNOSIS — J439 Emphysema, unspecified: Secondary | ICD-10-CM | POA: Diagnosis not present

## 2019-09-21 LAB — GLUCOSE, CAPILLARY: Glucose-Capillary: 78 mg/dL (ref 70–99)

## 2019-09-21 MED ORDER — FLUDEOXYGLUCOSE F - 18 (FDG) INJECTION
7.7600 | Freq: Once | INTRAVENOUS | Status: AC
  Administered 2019-09-21: 13:00:00 7.76 via INTRAVENOUS

## 2019-09-23 ENCOUNTER — Telehealth: Payer: Self-pay | Admitting: Emergency Medicine

## 2019-09-23 DIAGNOSIS — R918 Other nonspecific abnormal finding of lung field: Secondary | ICD-10-CM

## 2019-09-23 NOTE — Telephone Encounter (Signed)
I reviewed the patient's PET scan with him today by phone.  The right lower lobe nodular infiltrate is increased in size, is hypermetabolic on the PET scan.  I do not see any distant disease or other hypermetabolic areas.  Presents at least moderate to high suspicion for primary lung malignancy.  He had ENB done in 2017 when the lesion was much smaller that was nondiagnostic.  I talked to him today about repeating the ENB and he is willing to do so.  I am going to try to schedule for March 17

## 2019-09-28 NOTE — Telephone Encounter (Signed)
Please let him know that he is correct we just changed the procedure from 3/17 to 3/16 and I'd like to keep it on the 16th if possible.   I am sorry that nobody called him yet to let him know.  If this works for him then the 16th will work better for the hospital schedule.

## 2019-09-28 NOTE — Telephone Encounter (Signed)
RB please advise- Per chart, pt is scheduled for a bronch on 10/13/2019.  Pt states nobody has called to let him know that he is scheduled for this.  States that he could make this date, but it was previously discussed that this would be done on 3/17 and would be more convenient for pt.  Is it possible to move bronch to 3/17?  Or do we need to keep it at 3/16 d/t scheduling in the OR/Bronch suite? Additionally, please advise if pt needs to be made aware of anything prior to procedure.  Thanks.

## 2019-10-01 ENCOUNTER — Ambulatory Visit (INDEPENDENT_AMBULATORY_CARE_PROVIDER_SITE_OTHER): Payer: Medicare Other | Admitting: Podiatry

## 2019-10-01 ENCOUNTER — Other Ambulatory Visit: Payer: Self-pay

## 2019-10-01 ENCOUNTER — Encounter: Payer: Self-pay | Admitting: Podiatry

## 2019-10-01 DIAGNOSIS — B351 Tinea unguium: Secondary | ICD-10-CM

## 2019-10-01 DIAGNOSIS — Q828 Other specified congenital malformations of skin: Secondary | ICD-10-CM | POA: Diagnosis not present

## 2019-10-01 DIAGNOSIS — M79675 Pain in left toe(s): Secondary | ICD-10-CM

## 2019-10-01 DIAGNOSIS — D689 Coagulation defect, unspecified: Secondary | ICD-10-CM

## 2019-10-01 DIAGNOSIS — M205X2 Other deformities of toe(s) (acquired), left foot: Secondary | ICD-10-CM

## 2019-10-01 NOTE — Progress Notes (Signed)
This patient presents the office today for continued evaluation and treatment of his painful callus left big toe he says this callus is painful walking and wearing his shoes.    He says that  he presents to the office today for continued evaluation and treatment of the painful callus left big toe.  Patient also requests treatment of malformed big toenail left foot.   Patient is taking plavix.  General Appearance  Alert, conversant and in no acute stress.  Vascular  Dorsalis pedis and posterior tibial  pulses are minimally  palpable  bilaterally.  Capillary return is within normal limits  bilaterally. Cold feet noted.  bilaterally.  Neurologic  Senn-Weinstein monofilament wire test within normal limits  bilaterally. Muscle power within normal limits bilaterally.  Nails Thick disfigured discolored nail  left hallux toenail left foot.  Orthopedic  No limitations of motion  feet .  No crepitus or effusions noted.  No bony pathology or digital deformities noted .Arthritis left rearfoot as well as arthritis MPJ and  IPJ left hallux.  Skin  normotropic skin with no porokeratosis noted bilaterally.  No signs of infections or ulcers noted.  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  Porokeratosis left hallux. Onychomycosis left hallux.  Debridement of porokeratosis  Left hallux. Debridement of nail left hallux.   RTC  9 weeks.   Gardiner Barefoot DPM

## 2019-10-10 ENCOUNTER — Other Ambulatory Visit (HOSPITAL_COMMUNITY)
Admission: RE | Admit: 2019-10-10 | Discharge: 2019-10-10 | Disposition: A | Discharge status: Home / Self Care | Payer: Medicare Other | Source: Ambulatory Visit | Attending: Emergency Medicine | Admitting: Emergency Medicine

## 2019-10-10 DIAGNOSIS — Z01812 Encounter for preprocedural laboratory examination: Secondary | ICD-10-CM | POA: Diagnosis present

## 2019-10-10 DIAGNOSIS — Z20822 Contact with and (suspected) exposure to covid-19: Secondary | ICD-10-CM | POA: Insufficient documentation

## 2019-10-10 LAB — SARS CORONAVIRUS 2 (TAT 6-24 HRS): SARS Coronavirus 2: NEGATIVE

## 2019-10-12 ENCOUNTER — Other Ambulatory Visit: Payer: Self-pay

## 2019-10-12 ENCOUNTER — Encounter (HOSPITAL_COMMUNITY): Payer: Self-pay | Admitting: Emergency Medicine

## 2019-10-12 NOTE — Progress Notes (Signed)
Spoke with pt's wife, Manuela Schwartz for pre-op call. DPR on file. Pt has hx of CAD with stents (in 2019). Pt's cardiologist is Dr. Geraldo Pitter. She states pt has not had any recent chest pain, he does have sob due to COPD. Pt is on Plavix, Manuela Schwartz states he was told to stop it (she thinks 5 days) prior to surgery. She knows he has stopped it but she is not certain when the last dose was.   Covid test was done 10/10/19 and it's negative. She states that pt is in quarantine and understands that he needs to continue quarantine until he comes to the hospital tomorrow.

## 2019-10-12 NOTE — Progress Notes (Signed)
Anesthesia Chart Review:  Pt is a same day work up    Case: [name] [name]:[id number] Date/Time: 10/13/19 1300   Procedure: [facility] AND VIDEO BRONCHOSCOPY WITH ENDOBRONCHIAL NAVIGATION (N/A )   Anesthesia type: General   Pre-op diagnosis: RIGHT LOWER LOPE LUNG MASS   Location: [place] 2 / [place] ENDOSCOPY   Surgeons: Collene Gobble, MD      DISCUSSION:  Pt is a 79 year old with hx of CAD (DES x2 to [place] in 2019), HTN, COPD, alcohol abuse. Former smoker - quit 2 months ago.   Pt to stop plavix 5 days before surgery   PROVIDERS: - PCP is Josetta Huddle, MD  - Cardiologist is Jyl Heinz, MD. Last office visit 05/26/19   LABS: Will be obtained day of surgery   IMAGES:  CT Super D chest 08/20/19:  1. New irregular thickening in the RIGHT lower lobe and new nodule in the LEFT lower lobe. In patient with smoking history and extensive centrilobular emphysema recommend follow-up FDG PET scan to determine malignant potential of these new lesions. 2. Angular scarring in the RIGHT upper lobe. 3. Aortic Atherosclerosis and Emphysema.    EKG 05/26/19: NSR   CV:  Cardiac cath 04/15/18:   Prox RCA lesion is 25% stenosed.  1st RPLB lesion is 50% stenosed. THis is a very small vessel.  Mid LAD lesion is 25% stenosed.  1st Mrg lesion is 90% stenosed.  A drug-eluting stent was successfully placed using a STENT SYNERGY DES 2.5X24.  Post intervention, there is a 0% residual stenosis.  Ost 1st Mrg lesion is 70% stenosed.  A drug-eluting stent was successfully placed using a STENT SYNERGY DES 2.75X8.  Post intervention, there is a 0% residual stenosis.  The left ventricular systolic function is normal.  LV end diastolic pressure is normal.  The left ventricular ejection fraction is 55-65% by visual estimate.  There is no aortic valve stenosis.    Past Medical History:  Diagnosis Date  . Alcohol abuse   . Arthritis   . BPH with elevated PSA   . COPD (chronic obstructive pulmonary  disease) ([place])   . Coronary artery disease    2019 with stents  . Dyspnea   . ED (erectile dysfunction)   . Elevated PSA   . GERD (gastroesophageal reflux disease)   . Hyperlipidemia   . Hypertension   . Kidney cysts   . Palpitations     Past Surgical History:  Procedure Laterality Date  . CARDIAC CATHETERIZATION  2002   50% RCA  . CATARACT EXTRACTION, BILATERAL    . CORONARY STENT INTERVENTION N/A 04/15/2018   Procedure: CORONARY STENT INTERVENTION;  Surgeon: Jettie Booze, MD;  Location: [place] [facility];  Service: Cardiovascular;  Laterality: N/A;  om1  . EYE SURGERY    . KNEE ARTHROSCOPY    . LEFT HEART CATH AND CORONARY ANGIOGRAPHY N/A 04/15/2018   Procedure: LEFT HEART CATH AND CORONARY ANGIOGRAPHY;  Surgeon: Jettie Booze, MD;  Location: [place] [facility];  Service: Cardiovascular;  Laterality: N/A;  . MULTIPLE TOOTH EXTRACTIONS    . TONSILLECTOMY    . VIDEO BRONCHOSCOPY WITH ENDOBRONCHIAL NAVIGATION N/A 07/26/2016   Procedure: VIDEO BRONCHOSCOPY WITH ENDOBRONCHIAL NAVIGATION;  Surgeon: Collene Gobble, MD;  Location: [facility] OR;  Service: Thoracic;  Laterality: N/A;    MEDICATIONS: No current facility-administered medications for this encounter.   Marland Kitchen albuterol (PROVENTIL) (2.5 MG/3ML) 0.083% nebulizer solution  . albuterol (VENTOLIN HFA) 108 (90 Base) MCG/ACT inhaler  .  amoxicillin (AMOXIL) 500 MG capsule  . CHANTIX CONTINUING MONTH PAK 1 MG tablet  . Cholecalciferol (VITAMIN D) 2000 units CAPS  . clopidogrel (PLAVIX) 75 MG tablet  . esomeprazole (NEXIUM) 20 MG capsule  . Fluticasone-Umeclidin-Vilant (TRELEGY ELLIPTA) 100-62.5-25 MCG/INH AEPB  . Ipratropium-Albuterol (COMBIVENT RESPIMAT) 20-100 MCG/ACT AERS respimat  . Melatonin 5 MG CAPS  . nitroGLYCERIN (NITROSTAT) 0.4 MG SL tablet  . predniSONE (DELTASONE) 5 MG tablet  . rosuvastatin (CRESTOR) 20 MG tablet  . Tiotropium Bromide-Olodaterol (STIOLTO RESPIMAT) 2.5-2.5 MCG/ACT AERS   . thallous chloride  (201 THALLIUM) injection 4 milli Curie   Pt to stop plavix 5 days before surgery   If labs acceptable day of surgery, I anticipate pt can proceed with surgery as scheduled.  Willeen Cass, FNP-BC [facility]/Anesthesiology Phone: [phone] 10/12/2019 3:20 PM

## 2019-10-13 ENCOUNTER — Ambulatory Visit (HOSPITAL_COMMUNITY): Payer: Medicare Other

## 2019-10-13 ENCOUNTER — Encounter (HOSPITAL_COMMUNITY): Payer: Self-pay | Admitting: Emergency Medicine

## 2019-10-13 ENCOUNTER — Other Ambulatory Visit: Payer: Self-pay

## 2019-10-13 ENCOUNTER — Ambulatory Visit (HOSPITAL_COMMUNITY)
Admission: RE | Admit: 2019-10-13 | Discharge: 2019-10-13 | Disposition: A | Discharge status: Home / Self Care | Payer: Medicare Other | Attending: Emergency Medicine | Admitting: Emergency Medicine

## 2019-10-13 ENCOUNTER — Encounter (HOSPITAL_COMMUNITY)
Admission: RE | Disposition: A | Discharge status: Home / Self Care | Payer: Self-pay | Source: Home / Self Care | Attending: Emergency Medicine

## 2019-10-13 ENCOUNTER — Ambulatory Visit (HOSPITAL_COMMUNITY): Payer: Medicare Other | Admitting: Emergency Medicine

## 2019-10-13 DIAGNOSIS — Z955 Presence of coronary angioplasty implant and graft: Secondary | ICD-10-CM | POA: Diagnosis not present

## 2019-10-13 DIAGNOSIS — Z7952 Long term (current) use of systemic steroids: Secondary | ICD-10-CM | POA: Insufficient documentation

## 2019-10-13 DIAGNOSIS — Z87891 Personal history of nicotine dependence: Secondary | ICD-10-CM | POA: Insufficient documentation

## 2019-10-13 DIAGNOSIS — R911 Solitary pulmonary nodule: Secondary | ICD-10-CM

## 2019-10-13 DIAGNOSIS — Z9889 Other specified postprocedural states: Secondary | ICD-10-CM

## 2019-10-13 DIAGNOSIS — R918 Other nonspecific abnormal finding of lung field: Secondary | ICD-10-CM

## 2019-10-13 DIAGNOSIS — I10 Essential (primary) hypertension: Secondary | ICD-10-CM | POA: Diagnosis not present

## 2019-10-13 DIAGNOSIS — Z7902 Long term (current) use of antithrombotics/antiplatelets: Secondary | ICD-10-CM | POA: Insufficient documentation

## 2019-10-13 DIAGNOSIS — K219 Gastro-esophageal reflux disease without esophagitis: Secondary | ICD-10-CM | POA: Diagnosis not present

## 2019-10-13 DIAGNOSIS — I2510 Atherosclerotic heart disease of native coronary artery without angina pectoris: Secondary | ICD-10-CM | POA: Insufficient documentation

## 2019-10-13 DIAGNOSIS — J449 Chronic obstructive pulmonary disease, unspecified: Secondary | ICD-10-CM | POA: Insufficient documentation

## 2019-10-13 DIAGNOSIS — E785 Hyperlipidemia, unspecified: Secondary | ICD-10-CM | POA: Diagnosis not present

## 2019-10-13 DIAGNOSIS — Z79899 Other long term (current) drug therapy: Secondary | ICD-10-CM | POA: Insufficient documentation

## 2019-10-13 DIAGNOSIS — Z7951 Long term (current) use of inhaled steroids: Secondary | ICD-10-CM | POA: Insufficient documentation

## 2019-10-13 HISTORY — PX: BRONCHIAL BRUSHINGS: SHX5108

## 2019-10-13 HISTORY — PX: VIDEO BRONCHOSCOPY WITH ENDOBRONCHIAL NAVIGATION: SHX6175

## 2019-10-13 HISTORY — PX: BRONCHIAL NEEDLE ASPIRATION BIOPSY: SHX5106

## 2019-10-13 HISTORY — PX: BRONCHIAL WASHINGS: SHX5105

## 2019-10-13 HISTORY — PX: BRONCHIAL BIOPSY: SHX5109

## 2019-10-13 LAB — COMPREHENSIVE METABOLIC PANEL
ALT: 20 U/L (ref 0–44)
AST: 23 U/L (ref 15–41)
Albumin: 4.2 g/dL (ref 3.5–5.0)
Alkaline Phosphatase: 65 U/L (ref 38–126)
Anion gap: 11 (ref 5–15)
BUN: 15 mg/dL (ref 8–23)
CO2: 28 mmol/L (ref 22–32)
Calcium: 9.1 mg/dL (ref 8.9–10.3)
Chloride: 101 mmol/L (ref 98–111)
Creatinine, Ser: 1.05 mg/dL (ref 0.61–1.24)
GFR calc Af Amer: >60 mL/min (ref >60)
GFR calc non Af Amer: >60 mL/min (ref >60)
Glucose, Bld: 101 mg/dL — ABNORMAL HIGH (ref 70–99)
Potassium: 4.4 mmol/L (ref 3.5–5.1)
Sodium: 140 mmol/L (ref 135–145)
Total Bilirubin: 0.9 mg/dL (ref 0.3–1.2)
Total Protein: 6.9 g/dL (ref 6.5–8.1)

## 2019-10-13 LAB — CBC
HCT: 51.2 % (ref 39.0–52.0)
Hemoglobin: 16.8 g/dL (ref 13.0–17.0)
MCH: 31 pg (ref 26.0–34.0)
MCHC: 32.8 g/dL (ref 30.0–36.0)
MCV: 94.5 fL (ref 80.0–100.0)
Platelets: 263 10*3/uL (ref 150–400)
RBC: 5.42 MIL/uL (ref 4.22–5.81)
RDW: 13.4 % (ref 11.5–15.5)
WBC: 10.2 10*3/uL (ref 4.0–10.5)
nRBC: 0 % (ref 0.0–0.2)

## 2019-10-13 LAB — PROTIME-INR
INR: 1 (ref 0.8–1.2)
Prothrombin Time: 13.5 seconds (ref 11.4–15.2)

## 2019-10-13 LAB — APTT: aPTT: 33 seconds (ref 24–36)

## 2019-10-13 SURGERY — VIDEO BRONCHOSCOPY WITH ENDOBRONCHIAL NAVIGATION
Anesthesia: General

## 2019-10-13 MED ORDER — SUGAMMADEX SODIUM 200 MG/2ML IV SOLN
INTRAVENOUS | Status: DC | PRN
  Administered 2019-10-13: 150 mg via INTRAVENOUS

## 2019-10-13 MED ORDER — FENTANYL CITRATE (PF) 250 MCG/5ML IJ SOLN
INTRAMUSCULAR | Status: DC | PRN
  Administered 2019-10-13: 100 ug via INTRAVENOUS

## 2019-10-13 MED ORDER — MIDAZOLAM HCL 5 MG/5ML IJ SOLN
INTRAMUSCULAR | Status: DC | PRN
  Administered 2019-10-13: 1 mg via INTRAVENOUS

## 2019-10-13 MED ORDER — PROPOFOL 10 MG/ML IV BOLUS
INTRAVENOUS | Status: DC | PRN
  Administered 2019-10-13: 120 mg via INTRAVENOUS

## 2019-10-13 MED ORDER — PHENYLEPHRINE 40 MCG/ML (10ML) SYRINGE FOR IV PUSH (FOR BLOOD PRESSURE SUPPORT)
PREFILLED_SYRINGE | INTRAVENOUS | Status: DC | PRN
  Administered 2019-10-13: 80 ug via INTRAVENOUS
  Administered 2019-10-13 (×2): 120 ug via INTRAVENOUS
  Administered 2019-10-13: 80 ug via INTRAVENOUS

## 2019-10-13 MED ORDER — LIDOCAINE 2% (20 MG/ML) 5 ML SYRINGE
INTRAMUSCULAR | Status: DC | PRN
  Administered 2019-10-13: 100 mg via INTRAVENOUS

## 2019-10-13 MED ORDER — CLOPIDOGREL BISULFATE 75 MG PO TABS: 75.0000 mg | ORAL_TABLET | Freq: Every day | ORAL | Status: DC

## 2019-10-13 MED ORDER — COMBIVENT RESPIMAT 20-100 MCG/ACT IN AERS: 1.0000 | INHALATION_SPRAY | Freq: Four times a day (QID) | RESPIRATORY_TRACT | Status: DC | PRN

## 2019-10-13 MED ORDER — LACTATED RINGERS IV SOLN: INTRAVENOUS | Status: DC

## 2019-10-13 MED ORDER — ONDANSETRON HCL 4 MG/2ML IJ SOLN
INTRAMUSCULAR | Status: DC | PRN
  Administered 2019-10-13: 4 mg via INTRAVENOUS

## 2019-10-13 MED ORDER — PHENYLEPHRINE HCL-NACL 10-0.9 MG/250ML-% IV SOLN
INTRAVENOUS | Status: DC | PRN
  Administered 2019-10-13: 50 ug/min via INTRAVENOUS

## 2019-10-13 MED ORDER — ROCURONIUM BROMIDE 50 MG/5ML IV SOSY
PREFILLED_SYRINGE | INTRAVENOUS | Status: DC | PRN
  Administered 2019-10-13: 40 mg via INTRAVENOUS
  Administered 2019-10-13: 60 mg via INTRAVENOUS

## 2019-10-13 NOTE — Op Note (Signed)
Video Bronchoscopy with Electromagnetic Navigation Procedure Note  Date of Operation: 10/13/2019  Pre-op Diagnosis: Bilateral pulmonary nodules  Post-op Diagnosis: Same  Surgeon: Baltazar Apo  Assistants: None  Anesthesia: General endotracheal anesthesia  Operation: Flexible video fiberoptic bronchoscopy with electromagnetic navigation and biopsies.  Estimated Blood Loss: Minimal  Complications: None apparent  Indications and History: [name] [name]. is a 79 y.o. male with history of tobacco.  He has evolving right lower lobe and a new left lower lobe nodule.  Recommendation was made to achieve a tissue diagnosis via navigational bronchoscopy with biopsies the risks, benefits, complications, treatment options and expected outcomes were discussed with the patient.  The possibilities of pneumothorax, pneumonia, reaction to medication, pulmonary aspiration, perforation of a viscus, bleeding, failure to diagnose a condition and creating a complication requiring transfusion or operation were discussed with the patient who freely signed the consent.    Description of Procedure: The patient was seen in the Preoperative Area, was examined and was deemed appropriate to proceed.  The patient was taken to [facility] endoscopy room 2, identified as [name] [name]. and the procedure verified as Flexible Video Fiberoptic Bronchoscopy.  A Time Out was held and the above information confirmed.   Prior to the date of the procedure a high-resolution CT scan of the chest was performed. Utilizing [place] a virtual tracheobronchial tree was generated to allow the creation of distinct navigation pathways to the patient's parenchymal abnormalities. After being taken to the operating room general anesthesia was initiated and the patient  was orally intubated. The video fiberoptic bronchoscope was introduced via the endotracheal tube and a general inspection was performed which showed  normal airways throughout. The extendable working channel and locator guide were introduced into the bronchoscope. The distinct navigation pathways prepared prior to this procedure were then utilized to navigate to within 0.5 to 1.5 cm of patient's lesions identified on CT scan. The extendable working channel was secured into place at each location and the locator guide was withdrawn. Under fluoroscopic guidance transbronchial needle brushings, transbronchial Wang needle biopsies, and transbronchial forceps biopsies were performed first at target 1 (right lower lobe) and then at target 3 (left lower lobe) to be sent for cytology and pathology. A bronchioalveolar lavage was performed in both the right lower lobe and the left lower lobe and sent for cytology and microbiology (bacterial, fungal, AFB smears and cultures). At the end of the procedure a general airway inspection was performed and there was no evidence of active bleeding. The bronchoscope was removed.  The patient tolerated the procedure well. There was no significant blood loss and there were no obvious complications. A post-procedural chest x-ray is pending.  Samples: 1. Transbronchial needle brushings from right lower lobe nodule 2. Transbronchial Wang needle biopsies from right lower lobe nodule 3. Transbronchial forceps biopsies from right lower lobe nodule 4. Bronchoalveolar lavage from right lower lobe 5. Transbronchial needle brushings from left lower lobe nodule 6. Transbronchial Wang needle biopsies from left lower lobe nodule 7. Transbronchial forceps biopsies from left lower lobe nodule 8. Bronchoalveolar lavage from left lower lobe  Plans:  The patient will be discharged from the PACU to home when recovered from anesthesia and after chest x-ray is reviewed. We will review the cytology, pathology and microbiology results with the patient when they become available. Outpatient followup will be with Dr. Lamonte Sakai.    [name] [name]  S. 10/13/2019

## 2019-10-13 NOTE — H&P (Signed)
[name] [name]. is an 79 y.o. male.   Chief Complaint: abnormal CT chest HPI: 79 year old man whom I have followed for COPD and pulmonary nodular disease.  He had a nondiagnostic/reassuring navigational bronchoscopy and December 2017.  I been following him with serial films.  His CT chest from 08/20/2019 showed some slight progression of right lower lobe infiltrate/opacity adjacent to scar.  He also had a stable medial right lower lobe nodule 7 mm and a new 1 cm left lower lobe nodule.  PET scan was performed on 09/21/2019 and showed that the irregular right lower lobe masslike opacity was hypermetabolic.  The left lower lobe nodule, 1 cm had low-level hypermetabolism as well.  A left upper lobe nodule was 9 mm and stable from prior.  He presents today for navigational bronchoscopy for tissue diagnosis.  He reports no problems since our last visit.  He understands the procedure, risk, benefits and agrees to proceed  Past Medical History:  Diagnosis Date  . Alcohol abuse   . Arthritis   . BPH with elevated PSA   . COPD (chronic obstructive pulmonary disease) ([place])   . Coronary artery disease    2019 with stents  . Dyspnea   . ED (erectile dysfunction)   . Elevated PSA   . GERD (gastroesophageal reflux disease)   . Hyperlipidemia   . Hypertension   . Kidney cysts   . Palpitations     Past Surgical History:  Procedure Laterality Date  . CARDIAC CATHETERIZATION  2002   50% RCA  . CATARACT EXTRACTION, BILATERAL    . CORONARY STENT INTERVENTION N/A 04/15/2018   Procedure: CORONARY STENT INTERVENTION;  Surgeon: Jettie Booze, MD;  Location: [place] [facility];  Service: Cardiovascular;  Laterality: N/A;  om1  . EYE SURGERY    . KNEE ARTHROSCOPY    . LEFT HEART CATH AND CORONARY ANGIOGRAPHY N/A 04/15/2018   Procedure: LEFT HEART CATH AND CORONARY ANGIOGRAPHY;  Surgeon: Jettie Booze, MD;  Location: [place] [facility];  Service: Cardiovascular;  Laterality: N/A;  . MULTIPLE  TOOTH EXTRACTIONS    . TONSILLECTOMY    . VIDEO BRONCHOSCOPY WITH ENDOBRONCHIAL NAVIGATION N/A 07/26/2016   Procedure: VIDEO BRONCHOSCOPY WITH ENDOBRONCHIAL NAVIGATION;  Surgeon: Collene Gobble, MD;  Location: [facility] OR;  Service: Thoracic;  Laterality: N/A;    Family History  Problem Relation Age of Onset  . Hypertension Mother   . Alzheimer's disease Mother    Social History:  reports that he quit smoking about 7 weeks ago. His smoking use included cigarettes. He has a 10.00 pack-year smoking history. He has never used smokeless tobacco. He reports that he does not drink alcohol or use drugs.  Allergies: No Known Allergies  Medications Prior to Admission  Medication Sig Dispense Refill  . albuterol (VENTOLIN HFA) 108 (90 Base) MCG/ACT inhaler Inhale 2 puffs into the lungs every 6 (six) hours as needed for wheezing or shortness of breath.    Marland Kitchen amoxicillin (AMOXIL) 500 MG capsule Take 500 mg by mouth 3 (three) times daily.    Hend[place] Limes CONTINUING MONTH PAK 1 MG tablet Take 1 mg by mouth daily.     . Cholecalciferol (VITAMIN D) 2000 units CAPS Take 2,000 Units by mouth daily.    . clopidogrel (PLAVIX) 75 MG tablet TAKE 1 TABLET BY MOUTH EVERY DAY (Patient taking differently: Take 75 mg by mouth daily. ) 90 tablet 2  . esomeprazole (NEXIUM) 20 MG capsule Take 20 mg by mouth  daily.     . Fluticasone-Umeclidin-Vilant (TRELEGY ELLIPTA) 100-62.5-25 MCG/INH AEPB Inhale 1 puff into the lungs daily. 28 each 0  . Ipratropium-Albuterol (COMBIVENT RESPIMAT) 20-100 MCG/ACT AERS respimat Inhale 1-2 puffs into the lungs every 6 (six) hours. (Patient taking differently: Inhale 1-2 puffs into the lungs every 6 (six) hours as needed for wheezing or shortness of breath. ) 4 g 11  . Melatonin 5 MG CAPS Take 5 mg by mouth at bedtime.    . nitroGLYCERIN (NITROSTAT) 0.4 MG SL tablet Place 1 tablet (0.4 mg total) under the tongue every 5 (five) minutes as needed for chest pain. 25 tablet 11  . predniSONE (DELTASONE)  5 MG tablet Take 5 mg by mouth daily with breakfast.    . rosuvastatin (CRESTOR) 20 MG tablet TAKE 1 TABLET BY MOUTH EVERYDAY AT BEDTIME (Patient taking differently: Take 20 mg by mouth every evening. ) 90 tablet 1  . albuterol (PROVENTIL) (2.5 MG/3ML) 0.083% nebulizer solution Take 3 mLs (2.5 mg total) by nebulization every 6 (six) hours as needed for wheezing or shortness of breath. 150 mL 5  . Tiotropium Bromide-Olodaterol (STIOLTO RESPIMAT) 2.5-2.5 MCG/ACT AERS Inhale 2 puffs into the lungs daily. (Patient not taking: Reported on 10/08/2019) 4 g 5    No results found for this or any previous visit (from the past 48 hour(s)). No results found.  Review of Systems  Blood pressure (!) 176/73, pulse 87, temperature 97.6 F (36.4 C), temperature source Oral, resp. rate 18, height 5' 10.5" (1.791 m), weight 70.3 kg, SpO2 100 %. Physical Exam  Gen: Pleasant, well-nourished, in no distress,  normal affect  ENT: No lesions,  mouth clear,  oropharynx clear, no postnasal drip  Neck: No JVD, no stridor  Lungs: No use of accessory muscles, distant, no crackles or wheezing on normal respiration, no wheeze on forced expiration  Cardiovascular: RRR, heart sounds normal, no murmur or gallops, no peripheral edema  Musculoskeletal: No deformities, no cyanosis or clubbing  Neuro: alert, awake, non focal  Skin: Warm, no lesions or rashes     Assessment/Plan Bilateral pulmonary nodules with interval change compared with prior CT chest, mild hypermetabolism noted on PET scan.  He presents today for navigational bronchoscopy for tissue diagnosis.  Procedure explained, risk, benefits.  He agrees to proceed.  I do not see any barriers.  Collene Gobble, MD 10/13/2019, 11:58 AM

## 2019-10-13 NOTE — Anesthesia Procedure Notes (Signed)
Procedure Name: Intubation Date/Time: 10/13/2019 1:28 PM Performed by: Griffin Dakin, CRNA Pre-anesthesia Checklist: Patient identified, Emergency Drugs available, Suction available and Patient being monitored Patient Re-evaluated:Patient Re-evaluated prior to induction Oxygen Delivery Method: Circle system utilized Preoxygenation: Pre-oxygenation with 100% oxygen Induction Type: IV induction Ventilation: Mask ventilation without difficulty Laryngoscope Size: Mac and 4 Grade View: Grade I Tube type: Oral Tube size: 8.5 mm Number of attempts: 1 Airway Equipment and Method: Stylet and Oral airway Placement Confirmation: ETT inserted through vocal cords under direct vision,  positive ETCO2 and breath sounds checked- equal and bilateral Secured at: 24 cm Tube secured with: Tape Dental Injury: Teeth and Oropharynx as per pre-operative assessment

## 2019-10-13 NOTE — Anesthesia Postprocedure Evaluation (Signed)
Anesthesia Post Note  Patient: [name] [name].  Procedure(s) Performed: BRONCH AND VIDEO BRONCHOSCOPY WITH ENDOBRONCHIAL NAVIGATION (N/A ) BRONCHIAL BRUSHINGS BRONCHIAL NEEDLE ASPIRATION BIOPSIES BRONCHIAL WASHINGS BRONCHIAL BIOPSIES     Patient location during evaluation: PACU Anesthesia Type: General Level of consciousness: awake and alert Pain management: pain level controlled Vital Signs Assessment: post-procedure vital signs reviewed and stable Respiratory status: spontaneous breathing, nonlabored ventilation and respiratory function stable Cardiovascular status: blood pressure returned to baseline and stable Postop Assessment: no apparent nausea or vomiting Anesthetic complications: no    Last Vitals:  Vitals:   10/13/19 1505 10/13/19 1520  BP: (!) 137/57 (!) 116/58  Pulse: 71 71  Resp: 13 14  Temp: 36.7 C   SpO2: 100% 98%    Last Pain:  Vitals:   10/13/19 1505  TempSrc:   PainSc: 0-No pain                 Lidia Collum

## 2019-10-13 NOTE — Anesthesia Preprocedure Evaluation (Signed)
Anesthesia Evaluation  Patient identified by MRN, date of birth, ID band Patient awake    Reviewed: Allergy & Precautions, NPO status , Patient's Chart, lab work & pertinent test results  History of Anesthesia Complications Negative for: history of anesthetic complications  Airway Mallampati: II  TM Distance: >3 FB Neck ROM: Full    Dental  (+) Teeth Intact   Pulmonary COPD, Patient abstained from smoking., former smoker,  RLL lung mass   Pulmonary exam normal        Cardiovascular hypertension, + CAD and + Cardiac Stents  Normal cardiovascular exam     Neuro/Psych negative neurological ROS  negative psych ROS   GI/Hepatic GERD  ,(+)     substance abuse  alcohol use,   Endo/Other  negative endocrine ROS  Renal/GU negative Renal ROS  negative genitourinary   Musculoskeletal negative musculoskeletal ROS (+)   Abdominal   Peds  Hematology negative hematology ROS (+)   Anesthesia Other Findings  Cath 2019: EF 55-65%, no AS, DES x2 placed  Reproductive/Obstetrics                            Anesthesia Physical Anesthesia Plan  ASA: III  Anesthesia Plan: General   Post-op Pain Management:    Induction: Intravenous  PONV Risk Score and Plan: 2 and Ondansetron, Dexamethasone, Treatment may vary due to age or medical condition and Midazolam  Airway Management Planned: Oral ETT  Additional Equipment: None  Intra-op Plan:   Post-operative Plan: Extubation in OR  Informed Consent: I have reviewed the patients History and Physical, chart, labs and discussed the procedure including the risks, benefits and alternatives for the proposed anesthesia with the patient or authorized representative who has indicated his/her understanding and acceptance.     Dental advisory given  Plan Discussed with:   Anesthesia Plan Comments:         Anesthesia Quick Evaluation

## 2019-10-13 NOTE — Transfer of Care (Signed)
Immediate Anesthesia Transfer of Care Note  Patient: [name] [name].  Procedure(s) Performed: BRONCH AND VIDEO BRONCHOSCOPY WITH ENDOBRONCHIAL NAVIGATION (N/A ) BRONCHIAL BRUSHINGS BRONCHIAL NEEDLE ASPIRATION BIOPSIES BRONCHIAL WASHINGS BRONCHIAL BIOPSIES  Patient Location: PACU  Anesthesia Type:General  Level of Consciousness: awake, alert  and oriented  Airway & Oxygen Therapy: Patient Spontanous Breathing and Patient connected to nasal cannula oxygen  Post-op Assessment: Report given to RN and Post -op Vital signs reviewed and stable  Post vital signs: Reviewed and stable  Last Vitals:  Vitals Value Taken Time  BP    Temp    Pulse    Resp    SpO2      Last Pain:  Vitals:   10/13/19 1148  TempSrc:   PainSc: 0-No pain      Patients Stated Pain Goal: 4 ([id number])  Complications: No apparent anesthesia complications

## 2019-10-13 NOTE — Discharge Instructions (Signed)
Flexible Bronchoscopy, Care After This sheet gives you information about how to care for yourself after your test. Your doctor may also give you more specific instructions. If you have problems or questions, contact your doctor. Follow these instructions at home: Eating and drinking  Do not eat or drink anything (not even water) for 2 hours after your test, or until your numbing medicine (local anesthetic) wears off.  When your numbness is gone and your cough and gag reflexes have come back, you may: ? Eat only soft foods. ? Slowly drink liquids.  The day after the test, go back to your normal diet. Driving  Do not drive for 24 hours if you were given a medicine to help you relax (sedative).  Do not drive or use heavy machinery while taking prescription pain medicine. General instructions   Take over-the-counter and prescription medicines only as told by your doctor.  Return to your normal activities as told. Ask what activities are safe for you.  Do not use any products that have nicotine or tobacco in them. This includes cigarettes and e-cigarettes. If you need help quitting, ask your doctor.  Keep all follow-up visits as told by your doctor. This is important. It is very important if you had a tissue sample (biopsy) taken. Get help right away if:  You have shortness of breath that gets worse.  You get light-headed.  You feel like you are going to pass out (faint).  You have chest pain.  You cough up: ? More than a little blood. ? More blood than before. Summary  Do not eat or drink anything (not even water) for 2 hours after your test, or until your numbing medicine wears off.  Do not use cigarettes. Do not use e-cigarettes.  Get help right away if you have chest pain.   Please call our office for any questions or concerns.  [phone].   This information is not intended to replace advice given to you by your health care provider. Make sure you discuss any  questions you have with your health care provider. Document Revised: 06/28/2017 Document Reviewed: 08/03/2016 Elsevier Patient Education  2020 [employer].

## 2019-10-14 LAB — SURGICAL PATHOLOGY

## 2019-10-14 LAB — CYTOLOGY - NON PAP

## 2019-10-15 ENCOUNTER — Telehealth: Payer: Self-pay | Admitting: Emergency Medicine

## 2019-10-15 LAB — CULTURE, RESPIRATORY W GRAM STAIN
Culture: NO GROWTH
Culture: NO GROWTH
Gram Stain: NONE SEEN
Gram Stain: NONE SEEN

## 2019-10-15 NOTE — Telephone Encounter (Signed)
Reviewed FOB results with him today - no evidence malignancy in either the RLL or the LLL nodules. The RLL nodule had some non-caseating granulomas present, all cx data pending. Will follow w serial Ct's, follow cx data to completion.

## 2019-10-15 NOTE — Telephone Encounter (Signed)
RB- please advise on pt email regarding bronch results, thanks!  have read all the test results. Apparently there was insufficient tissue for some analysis, but that which was analyzed was negative for malignancy. Am I to interpret this as good news.

## 2019-11-05 ENCOUNTER — Other Ambulatory Visit: Payer: Self-pay

## 2019-11-05 ENCOUNTER — Encounter: Payer: Self-pay | Admitting: Podiatry

## 2019-11-05 ENCOUNTER — Other Ambulatory Visit: Payer: Self-pay | Admitting: Cardiology

## 2019-11-05 ENCOUNTER — Ambulatory Visit: Payer: Medicare Other | Admitting: Podiatry

## 2019-11-05 VITALS — Temp 97.7°F

## 2019-11-05 DIAGNOSIS — M205X2 Other deformities of toe(s) (acquired), left foot: Secondary | ICD-10-CM | POA: Diagnosis not present

## 2019-11-05 DIAGNOSIS — D689 Coagulation defect, unspecified: Secondary | ICD-10-CM

## 2019-11-05 DIAGNOSIS — Q828 Other specified congenital malformations of skin: Secondary | ICD-10-CM

## 2019-11-05 NOTE — Progress Notes (Signed)
This patient returns to my office for at risk foot care.  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  Patient is taking plavix.  Patient has developed a painful callus under his left big toe.  Patient is unable to self treat since he cannot reach his feet.  Patient has had surgery which has resulted in bony prominence noted at the fused IPJ left big toe.  This patient presents for at risk foot care today.  General Appearance  Alert, conversant and in no acute stress.  Vascular  Dorsalis pedis and posterior tibial  pulses are barely  palpable  bilaterally.  Capillary return is within normal limits  Bilaterally.  Cold feet noted   bilaterally.  Neurologic  Senn-Weinstein monofilament wire test within normal limits  bilaterally. Muscle power within normal limits bilaterally.  Nails Thick disfigured discolored nails with subungual debris  from hallux to fifth toes bilaterally. No evidence of bacterial infection or drainage bilaterally.  Orthopedic  No limitations of motion  feet .  No crepitus or effusions noted.  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  No signs of infections or ulcers noted.     Onychomycosis    Consent was obtained for treatment procedures.   Debridement of callus left hallux with # 15 blade. Danley Danker with dremel without incident.    Return office visit      Prn.               Told patient to return for periodic foot care and evaluation due to potential at risk complications.   Gardiner Barefoot DPM

## 2019-11-06 ENCOUNTER — Encounter: Payer: Self-pay | Admitting: Emergency Medicine

## 2019-11-06 ENCOUNTER — Ambulatory Visit: Payer: Medicare Other | Admitting: Emergency Medicine

## 2019-11-06 VITALS — BP 92/60 | HR 61 | Ht 70.5 in | Wt 160.0 lb

## 2019-11-06 DIAGNOSIS — R918 Other nonspecific abnormal finding of lung field: Secondary | ICD-10-CM

## 2019-11-06 NOTE — Patient Instructions (Addendum)
We will check TB blood work today.  We will plan to repeat your CT scan of the chest in August 2021. Continue your Trelegy 1 inhalation once daily.  Rinse and gargle after using. Keep albuterol available to use 2 puffs up to every 4 hours if needed for shortness of breath, chest tightness, wheezing.  Follow with Dr. Lamonte Sakai in August after your CT scan to review the results or sooner if you have any problems.

## 2019-11-06 NOTE — Progress Notes (Signed)
Subjective:    Patient ID: [name] Kemps., male    DOB: 1941/05/01, 79 y.o.   MRN: [id number]  COPD [name] [name] complains of shortness of breath. There is no cough or wheezing. Pertinent negatives include no ear pain, fever, headaches, postnasal drip, rhinorrhea, sneezing, sore throat or trouble swallowing. His past medical history is significant for COPD.    ROV 09/16/2018 --this a follow-up visit for COPD and pulmonary nodules that were deemed benign on navigational bronchoscopy that were following with serial imaging.  Most recent scan was 06/17/2018 at [facility], I don't see that the direct comparison was ever done.  1 month ago I treated [name] [name] for an acute exacerbation with steroids and azithromycin. [name] [name] is clinically improved. We also changed Stiolto to Trelegy > [name] [name] is benefiting. Unfortunately [name] [name] went back to cigarettes since last time, [name] [name] has also been vaping for the last year.   ROV 08/10/19 >> [name] [name] is 79.  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  We performed navigational bronchoscopy and then serial imaging and these were thought to be benign although I do not see any follow-up imaging.  [name] [name] smokes occasionally, 2 cigarettes last week.  [name] [name] does have some dyspnea, "good days and bad days".  [name] [name] has albuterol which [name] [name] uses rarely. [name] [name] underwent walking oximetry w Dr [name] in November, qualified for O2 and is planning to get it from [place], ? Dose [name] [name] needs. [name] [name] is using Mining engineer (from an old script?), and then adds on Trelegy at the end of the day if [name] [name] has more dyspnea. [name] [name] feels this works better than albuterol prn. I had planned for [name] [name] to be off stiolto.   ROV 09/10/19 --this a follow-up visit for COPD and pulmonary nodule disease.  [name] [name] had a nondiagnostic/reassuring navigational bronchoscopy in the past then followed with serial imaging.  At his last visit we committed to continuing Trelegy, Combivent as needed.  We repeated CT scan of the chest to follow his pulmonary nodules on 08/20/2019  and I have reviewed, shows no lymphadenopathy, angular scarring in the right upper lobe, some right lower lobe thickening that has evolved adjacent to prior scar, 2.3 x 1.7 cm new compared with 2018, stable nodule in the medial aspect of the right lower lobe 7 mm, new LLL . [name] [name] tells me that since last time [name] [name] has been found to have an elevated PSA, is being followed by Dr Gloriann Loan with Urology conservatively for now.   ROV 11/06/19 -- 79 year old man with a history of COPD and pulmonary nodular disease.  We have been following with serial imaging, last was 08/20/2019.  [name] [name] had a new area of right lower lobe thickening 2.3 x 1.7 cm as well as a new left lower lobe nodule.  [name] [name] underwent repeat navigational bronchoscopy on 10/13/2019.  Right lower lobe brushings and biopsies showed granulomatous inflammation.  The left lower lobe samples were all negative.  Cultures are similarly negative.  No AFB smear available. [name] [name] remains on Trelegy, minimal albuterol use.                                                          Review of Systems  Constitutional: Negative for fever and unexpected weight change.  HENT: Negative for congestion, dental problem, ear pain, nosebleeds, postnasal drip, rhinorrhea,  sinus pressure, sneezing, sore throat and trouble swallowing.   Eyes: Negative for redness and itching.  Respiratory: Positive for shortness of breath. Negative for cough, chest tightness and wheezing.   Cardiovascular: Negative for palpitations and leg swelling.  Gastrointestinal: Negative for nausea and vomiting.  Genitourinary: Negative for dysuria.  Musculoskeletal: Negative for joint swelling.  Skin: Negative for rash.  Neurological: Negative for headaches.  Hematological: Does not bruise/bleed easily.  Psychiatric/Behavioral: Negative for dysphoric mood. The patient is not nervous/anxious.        Objective:   Physical Exam Vitals:   11/06/19 1520  BP: 92/60  Pulse: 61  SpO2: 94%  Weight: 160 lb (72.6 kg)    Height: 5' 10.5" (1.791 m)   Gen: Pleasant, well-nourished, in no distress,  normal affect  ENT: No lesions,  mouth clear,  oropharynx clear, no postnasal drip  Neck: No JVD, no stridor  Lungs: No use of accessory muscles, distant,  no crackles or wheeze today  Cardiovascular: RRR, heart sounds normal, no murmur or gallops, no peripheral edema  Musculoskeletal: No deformities, no cyanosis or clubbing  Neuro: alert, non focal  Skin: Warm, no lesions or rash      Assessment & Plan:  COPD (chronic obstructive pulmonary disease) (HCC) Continue your Trelegy 1 inhalation once daily.  Rinse and gargle after using. Keep albuterol available to use 2 puffs up to every 4 hours if needed for shortness of breath, chest tightness, wheezing.  Follow with Dr. Lamonte Sakai in August   Pulmonary nodules The irregularly shaped right lower lobe nodular focus showed granulomatous disease on biopsies and cytology.  No AFB was done.  Unclear etiology.  We will check a QuantiFERON gold for completeness.  His left lower lobe pulmonary nodule had negative cytology, still concerning for possible malignancy.  We will plan to repeat his CT chest in August, 6 months.  Baltazar Apo, MD, PhD 11/06/2019, 3:56 PM [facility] [phone] or if no answer [phone]

## 2019-11-06 NOTE — Assessment & Plan Note (Signed)
Continue your Trelegy 1 inhalation once daily.  Rinse and gargle after using. Keep albuterol available to use 2 puffs up to every 4 hours if needed for shortness of breath, chest tightness, wheezing.  Follow with Dr. Lamonte Sakai in August

## 2019-11-06 NOTE — Assessment & Plan Note (Signed)
The irregularly shaped right lower lobe nodular focus showed granulomatous disease on biopsies and cytology.  No AFB was done.  Unclear etiology.  We will check a QuantiFERON gold for completeness.  His left lower lobe pulmonary nodule had negative cytology, still concerning for possible malignancy.  We will plan to repeat his CT chest in August, 6 months.

## 2019-11-08 LAB — QUANTIFERON-TB GOLD PLUS
Mitogen-NIL: 8.38 IU/mL
NIL: 0.03 IU/mL
QuantiFERON-TB Gold Plus: NEGATIVE
TB1-NIL: <0 IU/mL
TB2-NIL: 0.01 IU/mL

## 2019-11-12 ENCOUNTER — Encounter: Payer: Self-pay | Admitting: Cardiology

## 2019-11-12 ENCOUNTER — Ambulatory Visit: Payer: Medicare Other | Admitting: Cardiology

## 2019-11-12 ENCOUNTER — Other Ambulatory Visit: Payer: Self-pay

## 2019-11-12 VITALS — BP 116/78 | HR 76 | Ht 70.5 in | Wt 159.0 lb

## 2019-11-12 DIAGNOSIS — I10 Essential (primary) hypertension: Secondary | ICD-10-CM | POA: Diagnosis not present

## 2019-11-12 DIAGNOSIS — E782 Mixed hyperlipidemia: Secondary | ICD-10-CM

## 2019-11-12 DIAGNOSIS — I2510 Atherosclerotic heart disease of native coronary artery without angina pectoris: Secondary | ICD-10-CM | POA: Diagnosis not present

## 2019-11-12 MED ORDER — NITROGLYCERIN 0.4 MG SL SUBL: 0.4000 mg | SUBLINGUAL_TABLET | SUBLINGUAL | 11 refills | Status: DC | PRN

## 2019-11-12 NOTE — Addendum Note (Signed)
Addended by: Truddie Hidden on: 11/12/2019 11:39 AM   Modules accepted: Orders

## 2019-11-12 NOTE — Patient Instructions (Signed)

## 2019-11-12 NOTE — Progress Notes (Signed)
Cardiology Office Note:    Date:  11/12/2019   ID:  [name], DOB 07-Jul-1941, MRN [id number]  PCP:  Josetta Huddle, MD  Cardiologist:  Jenean Lindau, MD   Referring MD: Josetta Huddle, MD    ASSESSMENT:    1. Atherosclerosis of native coronary artery of native heart without angina pectoris   2. Essential (primary) hypertension   3. Mixed hyperlipidemia    PLAN:    In order of problems listed above:  1. Coronary artery disease: Secondary prevention stressed with the patient.  Importance of compliance with diet and medication stressed and he vocalized understanding.  Importance of regular exercise stressed and he promises to do his best and try to walk at least 30 minutes a day 5 days a week. 2. Essential hypertension: Blood pressure is stable diet was emphasized. 3. Mixed dyslipidemia: Lipids followed by primary care physician and lipids from October are fine. 4. Patient will be seen in follow-up appointment in 6 months or earlier if the patient has any concerns    Medication Adjustments/Labs and Tests Ordered: Current medicines are reviewed at length with the patient today.  Concerns regarding medicines are outlined above.  No orders of the defined types were placed in this encounter.  No orders of the defined types were placed in this encounter.    Chief Complaint  Patient presents with  . Follow-up    6 Months     History of Present Illness:    [name] [name]. is a 79 y.o. male.  Patient has known coronary artery disease he denies any problems at this time and takes care of activities of daily living.  No chest pain orthopnea or PND.  Time he walks on a regular basis.  He has history of COPD and has some shortness of breath on exertion which is steady.  This is managed by his primary care physician.  At the time of my evaluation, the patient is alert awake oriented and in no distress.  Past Medical History:  Diagnosis Date  . Alcohol abuse   .  Arthritis   . BPH with elevated PSA   . COPD (chronic obstructive pulmonary disease) ([place])   . Coronary artery disease    2019 with stents  . Dyspnea   . ED (erectile dysfunction)   . Elevated PSA   . GERD (gastroesophageal reflux disease)   . Hyperlipidemia   . Hypertension   . Kidney cysts   . Palpitations     Past Surgical History:  Procedure Laterality Date  . BRONCHIAL BIOPSY  10/13/2019   Procedure: BRONCHIAL BIOPSIES;  Surgeon: Collene Gobble, MD;  Location: [facility] ENDOSCOPY;  Service: Pulmonary;;  . BRONCHIAL BRUSHINGS  10/13/2019   Procedure: BRONCHIAL BRUSHINGS;  Surgeon: Collene Gobble, MD;  Location: [facility] ENDOSCOPY;  Service: Pulmonary;;  . BRONCHIAL NEEDLE ASPIRATION BIOPSY  10/13/2019   Procedure: BRONCHIAL NEEDLE ASPIRATION BIOPSIES;  Surgeon: Collene Gobble, MD;  Location: [place];  Service: Pulmonary;;  . BRONCHIAL WASHINGS  10/13/2019   Procedure: BRONCHIAL WASHINGS;  Surgeon: Collene Gobble, MD;  Location: [place] ENDOSCOPY;  Service: Pulmonary;;  . CARDIAC CATHETERIZATION  2002   50% RCA  . CATARACT EXTRACTION, BILATERAL    . CORONARY STENT INTERVENTION N/A 04/15/2018   Procedure: CORONARY STENT INTERVENTION;  Surgeon: Jettie Booze, MD;  Location: [place] CV LAB;  Service: Cardiovascular;  Laterality: N/A;  om1  . EYE SURGERY    . KNEE ARTHROSCOPY    .  LEFT HEART CATH AND CORONARY ANGIOGRAPHY N/A 04/15/2018   Procedure: LEFT HEART CATH AND CORONARY ANGIOGRAPHY;  Surgeon: Jettie Booze, MD;  Location: [place] CV LAB;  Service: Cardiovascular;  Laterality: N/A;  . MULTIPLE TOOTH EXTRACTIONS    . TONSILLECTOMY    . VIDEO BRONCHOSCOPY WITH ENDOBRONCHIAL NAVIGATION N/A 07/26/2016   Procedure: VIDEO BRONCHOSCOPY WITH ENDOBRONCHIAL NAVIGATION;  Surgeon: Collene Gobble, MD;  Location: [place];  Service: Thoracic;  Laterality: N/A;  . VIDEO BRONCHOSCOPY WITH ENDOBRONCHIAL NAVIGATION N/A 10/13/2019   Procedure: [facility] AND VIDEO BRONCHOSCOPY WITH  ENDOBRONCHIAL NAVIGATION;  Surgeon: Collene Gobble, MD;  Location: [place] ENDOSCOPY;  Service: Pulmonary;  Laterality: N/A;    Current Medications: Current Meds  Medication Sig  . albuterol (PROVENTIL) (2.5 MG/3ML) 0.083% nebulizer solution Take 3 mLs (2.5 mg total) by nebulization every 6 (six) hours as needed for wheezing or shortness of breath.  Marland Kitchen albuterol (VENTOLIN HFA) 108 (90 Base) MCG/ACT inhaler Inhale 2 puffs into the lungs every 6 (six) hours as needed for wheezing or shortness of breath.  Marland Kitchen amoxicillin (AMOXIL) 500 MG capsule Take 500 mg by mouth 3 (three) times daily.  . chlorhexidine (PERIDEX) 0.12 % solution   . Cholecalciferol (VITAMIN D) 2000 units CAPS Take 2,000 Units by mouth daily.  . clopidogrel (PLAVIX) 75 MG tablet TAKE 1 TABLET BY MOUTH EVERY DAY  . esomeprazole (NEXIUM) 20 MG capsule Take 20 mg by mouth daily.   . Fluticasone-Umeclidin-Vilant (TRELEGY ELLIPTA) 100-62.5-25 MCG/INH AEPB Inhale 1 puff into the lungs daily.  . Ipratropium-Albuterol (COMBIVENT RESPIMAT) 20-100 MCG/ACT AERS respimat Inhale 1-2 puffs into the lungs every 6 (six) hours as needed for wheezing or shortness of breath.  . Melatonin 5 MG CAPS Take 5 mg by mouth at bedtime.  . nitroGLYCERIN (NITROSTAT) 0.4 MG SL tablet Place 1 tablet (0.4 mg total) under the tongue every 5 (five) minutes as needed for chest pain.  . predniSONE (DELTASONE) 5 MG tablet Take 5 mg by mouth daily with breakfast.  . rosuvastatin (CRESTOR) 20 MG tablet TAKE 1 TABLET BY MOUTH EVERYDAY AT BEDTIME (Patient taking differently: Take 20 mg by mouth every evening. )     Allergies:   Patient has no known allergies.   Social History   Socioeconomic History  . Marital status: Married    Spouse name: Not on file  . Number of children: Not on file  . Years of education: Not on file  . Highest education level: Not on file  Occupational History  . Not on file  Tobacco Use  . Smoking status: Former Smoker    Packs/day: 0.25     Years: 40.00    Pack years: 10.00    Types: Cigarettes    Quit date: 08/22/2019    Years since quitting: 0.2  . Smokeless tobacco: Never Used  Substance and Sexual Activity  . Alcohol use: No    Alcohol/week: 0.0 standard drinks    Comment: quit drinking 2014  . Drug use: No  . Sexual activity: Not on file  Other Topics Concern  . Not on file  Social History Narrative  . Not on file   Social Determinants of Health   Financial Resource Strain:   . Difficulty of Paying Living Expenses:   Food Insecurity:   . Worried About Charity fundraiser in the Last Year:   . Arboriculturist in the Last Year:   Transportation Needs:   . Film/video editor (Medical):   Marland Kitchen  Lack of Transportation (Non-Medical):   Physical Activity:   . Days of Exercise per Week:   . Minutes of Exercise per Session:   Stress:   . Feeling of Stress :   Social Connections:   . Frequency of Communication with Friends and Family:   . Frequency of Social Gatherings with Friends and Family:   . Attends Religious Services:   . Active Member of Clubs or Organizations:   . Attends Archivist Meetings:   Marland Kitchen Marital Status:      Family History: The patient's family history includes Alzheimer's disease in his mother; Hypertension in his mother.  ROS:   Please see the history of present illness.    All other systems reviewed and are negative.  EKGs/Labs/Other Studies Reviewed:    The following studies were reviewed today: I reviewed lab work that was done in October   Recent Labs: 10/13/2019: ALT 20; BUN 15; Creatinine, Ser 1.05; Hemoglobin 16.8; Platelets 263; Potassium 4.4; Sodium 140  Recent Lipid Panel    Component Value Date/Time   CHOL 122 06/06/2018 1012   TRIG 54 06/06/2018 1012   HDL 61 06/06/2018 1012   CHOLHDL 2.0 06/06/2018 1012   LDLCALC 50 06/06/2018 1012    Physical Exam:    VS:  BP 116/78   Pulse 76   Ht 5' 10.5" (1.791 m)   Wt 159 lb (72.1 kg)   SpO2 96%   BMI 22.49  kg/m     Wt Readings from Last 3 Encounters:  11/12/19 159 lb (72.1 kg)  11/06/19 160 lb (72.6 kg)  10/13/19 155 lb (70.3 kg)     GEN: Patient is in no acute distress HEENT: Normal NECK: No JVD; No carotid bruits LYMPHATICS: No lymphadenopathy CARDIAC: Hear sounds regular, 2/6 systolic murmur at the apex. RESPIRATORY:  Clear to auscultation without rales, wheezing or rhonchi  ABDOMEN: Soft, non-tender, non-distended MUSCULOSKELETAL:  No edema; No deformity  SKIN: Warm and dry NEUROLOGIC:  Alert and oriented x 3 PSYCHIATRIC:  Normal affect   Signed, Jenean Lindau, MD  11/12/2019 11:26 AM    [place]

## 2019-11-30 NOTE — Telephone Encounter (Signed)
Received email from patient  I recently saw a commercial for a COPD product called Judithann Sauger that suggested a significant reduction in the possibility of COPD related flare ups. Do you have any thoughts on this product and it's possible benefit for me?  Dr. Lamonte Sakai please advise on inhaler

## 2019-12-01 ENCOUNTER — Other Ambulatory Visit: Payer: Self-pay | Admitting: Cardiology

## 2019-12-01 MED ORDER — CLOPIDOGREL BISULFATE 75 MG PO TABS: 75.0000 mg | ORAL_TABLET | Freq: Every day | ORAL | 3 refills | Status: DC

## 2019-12-01 NOTE — Telephone Encounter (Signed)
Refill sent in per request.  

## 2019-12-01 NOTE — Telephone Encounter (Signed)
*  STAT* If patient is at the pharmacy, call can be transferred to refill team.   1. Which medications need to be refilled? (please list name of each medication and dose if known)  clopidogrel (PLAVIX) 75 MG tablet  2. Which pharmacy/location (including street and city if local pharmacy) is medication to be sent to?  [id number] - [place], [place] - [address]  3. Do they need a 30 day or 90 day supply? 90 with refills. Pt would like enough medication to last a full calendar year  Pt is out of medication

## 2019-12-02 NOTE — Telephone Encounter (Signed)
Info from [place] sent to pt in [place] message.

## 2019-12-02 NOTE — Telephone Encounter (Signed)
Let him know that [name] [name] is an inhaler that has 3 components in it, similar to Trelegy. It is an HFA, prescribed as 2 puffs twice a day. Contrasted with Trelegy which a powdered inhaler taken once a day. Both medications contain a steroid, which is the component that has been shown to decrease the frequency of exacerbations.

## 2019-12-17 ENCOUNTER — Ambulatory Visit: Payer: Medicare Other | Admitting: Podiatry

## 2019-12-17 ENCOUNTER — Encounter: Payer: Self-pay | Admitting: Podiatry

## 2019-12-17 ENCOUNTER — Other Ambulatory Visit: Payer: Self-pay

## 2019-12-17 DIAGNOSIS — D689 Coagulation defect, unspecified: Secondary | ICD-10-CM

## 2019-12-17 DIAGNOSIS — Q828 Other specified congenital malformations of skin: Secondary | ICD-10-CM | POA: Diagnosis not present

## 2019-12-17 DIAGNOSIS — M205X2 Other deformities of toe(s) (acquired), left foot: Secondary | ICD-10-CM

## 2019-12-17 NOTE — Progress Notes (Signed)
This patient returns to my office for at risk foot care.  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  Patient develops a painful callus under left hallux.  This patient presents for at risk foot care today.  General Appearance  Alert, conversant and in no acute stress.  Vascular  Dorsalis pedis and posterior tibial  pulses are palpable  bilaterally.  Capillary return is within normal limits  bilaterally. Temperature is within normal limits  bilaterally.  Neurologic  Senn-Weinstein monofilament wire test within normal limits  bilaterally. Muscle power within normal limits bilaterally.  Nails Thick disfigured discolored nails with subungual debris  from hallux to fifth toes bilaterally. No evidence of bacterial infection or drainage bilaterally.  Orthopedic  No limitations of motion  feet .  No crepitus or effusions noted.  No bony pathology or digital deformities noted.  Skin  normotropic skin with no porokeratosis noted bilaterally.  No signs of infections or ulcers noted.   Callus plantar aspect left foot.  Porokeratosis left foot   Consent was obtained for treatment procedures.  Debridement of callus with # 15 blade.     Return office visit   prn                  Told patient to return for periodic foot care and evaluation due to potential at risk complications.   Gardiner Barefoot DPM

## 2019-12-25 ENCOUNTER — Other Ambulatory Visit: Payer: Self-pay | Admitting: Emergency Medicine

## 2019-12-25 ENCOUNTER — Telehealth: Payer: Self-pay | Admitting: Emergency Medicine

## 2019-12-25 MED ORDER — TRELEGY ELLIPTA 100-62.5-25 MCG/INH IN AEPB: INHALATION_SPRAY | RESPIRATORY_TRACT | 5 refills | Status: DC

## 2019-12-25 NOTE — Telephone Encounter (Signed)
Rx for trelegy refilled  Spoke with the pt and notified that this was done

## 2020-01-08 MED ORDER — TRELEGY ELLIPTA 100-62.5-25 MCG/INH IN AEPB: INHALATION_SPRAY | RESPIRATORY_TRACT | 7 refills | Status: DC

## 2020-02-04 DIAGNOSIS — M720 Palmar fascial fibromatosis [Dupuytren]: Secondary | ICD-10-CM | POA: Insufficient documentation

## 2020-02-04 DIAGNOSIS — M65331 Trigger finger, right middle finger: Secondary | ICD-10-CM

## 2020-02-04 HISTORY — DX: Trigger finger, right middle finger: M65.331

## 2020-02-04 HISTORY — DX: Palmar fascial fibromatosis (dupuytren): M72.0

## 2020-02-09 NOTE — Telephone Encounter (Signed)
This sounds like a fairly significant change in his breathing. I think he needs to be seen so we can assess his meds, determine whether he needs ambulatory oximetry. Set up with RB or APP

## 2020-02-25 ENCOUNTER — Ambulatory Visit
Admission: RE | Admit: 2020-02-25 | Discharge: 2020-02-25 | Disposition: A | Discharge status: Home / Self Care | Payer: Medicare Other | Source: Ambulatory Visit | Attending: Emergency Medicine | Admitting: Emergency Medicine

## 2020-02-25 ENCOUNTER — Other Ambulatory Visit: Payer: Self-pay | Admitting: Cardiology

## 2020-02-25 DIAGNOSIS — R918 Other nonspecific abnormal finding of lung field: Secondary | ICD-10-CM

## 2020-03-01 ENCOUNTER — Telehealth: Payer: Self-pay | Admitting: Primary Care

## 2020-03-01 ENCOUNTER — Ambulatory Visit: Payer: Medicare Other | Admitting: Primary Care

## 2020-03-01 ENCOUNTER — Encounter: Payer: Self-pay | Admitting: Primary Care

## 2020-03-01 ENCOUNTER — Other Ambulatory Visit: Payer: Self-pay

## 2020-03-01 DIAGNOSIS — R918 Other nonspecific abnormal finding of lung field: Secondary | ICD-10-CM | POA: Diagnosis not present

## 2020-03-01 DIAGNOSIS — J431 Panlobular emphysema: Secondary | ICD-10-CM

## 2020-03-01 DIAGNOSIS — J9611 Chronic respiratory failure with hypoxia: Secondary | ICD-10-CM

## 2020-03-01 MED ORDER — BREZTRI AEROSPHERE 160-9-4.8 MCG/ACT IN AERO: 2.0000 | INHALATION_SPRAY | Freq: Two times a day (BID) | RESPIRATORY_TRACT | 0 refills | Status: DC

## 2020-03-01 MED ORDER — COMBIVENT RESPIMAT 20-100 MCG/ACT IN AERS: 1.0000 | INHALATION_SPRAY | Freq: Four times a day (QID) | RESPIRATORY_TRACT | Status: DC | PRN

## 2020-03-01 NOTE — Telephone Encounter (Signed)
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 6 month repeat Super-D CT on 02/26/20 showed multiple bilateral irregular peribronchovascular pulmonary nodular densities.  Compared to previous exam these appear increased in size and number  Do you want me to order PET or do you want to bronch him again?

## 2020-03-01 NOTE — Telephone Encounter (Signed)
mychart encounter was also sent by pt. Will close this encounter due to it being a duplicate to the mychart.

## 2020-03-01 NOTE — Assessment & Plan Note (Addendum)
-   Increased shortness of breath on exertion with heat/humanity. Absent AECOPD symptoms.   - Trial Breztri two puffs twice daily (rinse mouth after use) - Continue 5mg  prednisone daily  - FU in 3 months with Dr. Lamonte Sakai

## 2020-03-01 NOTE — Assessment & Plan Note (Signed)
-   Ambulatory walk today desaturated to 86% on RA after 2 laps normal pace. O2 94% on 2L. Recovers quickly with rest.  - Sending in new order for 2L oxygen on exertion to DME company

## 2020-03-01 NOTE — Progress Notes (Signed)
@Patient  ID: [name] [name]., male    DOB: 1940-10-20, 79 y.o.   MRN: [id number]  Chief Complaint  Patient presents with  . Follow-up    lung nodules    Referring provider: Josetta Huddle, MD  HPI: 79 year old male, former smoker quit in January 2021 (10-pack-year history).  Past medical history significant for COPD, chronic respiratory failure with hypoxia, GERD, dysphagia, sleep disorder, pulmonary nodules, mixed hyperlipidemia, hypertension, coronary arthrosclerosis.  Patient of Dr. Lamonte Sakai, last seen in April 2021.  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  Unclear etiology, quantiferon Gold negative. There is still concern for possible malignancy, 6 month repeat CT chest scheduled for August 2021. Maintained on Trelegy Ellipta and prn albuterol.   03/01/2020 Patient presents today for 44-month follow-up visit for COPD/pulmonary nodules. He had PET scan in February 2021 which showed hypermetabolic nodule RLL,SUV 9.5. He underwent bronchoscopy in March 2021 by Dr. Lamonte Sakai that was consistent with granulomatous disease. Repeat Super-D CT chest on 02/26/2020 showed multiple bilateral irregular peribronchovascular pulmonary nodular densities.  Compared to previous exam these appear increased in size and number, including new nodules to left upper lobe and right lower lobe.  Location and appearance of nodular densities suggest postinflammatory/infectious process.   He called our office on 02/07/20 with complaints of increased shortness of breath when outside in heat/humidity. He feels he may need oxygen. Otherwise he feels well. No acute bronchitis symptoms. He coughs up clear mucus once or twice a day. He continues to play golf every day. He is compliant with Trelegy Ellipta. Denies fever, sweats, chest tightness or wheezing.    Imaging: 09/21/19 PET scan- No thoracic nodal hypermetabolism. The irregular masslike opacity within the right  lower lobe is hypermetabolic and has increased compared to 08/20/2019. Measures 3.4 x 2.1 cm and a S.U.V. max of 9.5   02/26/20 Super D - irregular right lower lobe opacity detailed on the prior diagnostic chest CT demonstrates significant hypermetabolism, and is increased compared to 08/20/2019. Considerations include an area of infection or relatively abrupt progression of a primary bronchogenic carcinoma.   No Known Allergies  Immunization History  Administered Date(s) Administered  . Influenza Split 04/19/2009, 04/26/2010, 05/14/2011  . Influenza, High Dose Seasonal PF 04/02/2018, 03/11/2019  . Influenza,inj,Quad PF,6+ Mos 03/30/2016  . PFIZER SARS-COV-2 Vaccination 08/13/2019, 09/01/2019  . Pneumococcal Conjugate-13 04/29/2014  . Pneumococcal Polysaccharide-23 09/12/2005, 05/01/2013    Past Medical History:  Diagnosis Date  . Alcohol abuse   . Arthritis   . BPH with elevated PSA   . COPD (chronic obstructive pulmonary disease) ([place])   . Coronary artery disease    2019 with stents  . Dyspnea   . ED (erectile dysfunction)   . Elevated PSA   . GERD (gastroesophageal reflux disease)   . Hyperlipidemia   . Hypertension   . Kidney cysts   . Palpitations     Tobacco History: Social History   Tobacco Use  Smoking Status Former Smoker  . Packs/day: 0.25  . Years: 40.00  . Pack years: 10.00  . Types: Cigarettes  . Quit date: 08/22/2019  . Years since quitting: 0.5  Smokeless Tobacco Never Used   Counseling given: Not Answered   Outpatient Medications Prior to Visit  Medication Sig Dispense Refill  . albuterol (PROVENTIL) (2.5 MG/3ML) 0.083% nebulizer solution Take 3 mLs (2.5 mg total) by nebulization every 6 (six) hours as needed for wheezing or shortness of breath. 150 mL 5  .  albuterol (VENTOLIN HFA) 108 (90 Base) MCG/ACT inhaler Inhale 2 puffs into the lungs every 6 (six) hours as needed for wheezing or shortness of breath.    Marland Kitchen amoxicillin (AMOXIL) 500 MG capsule  Take 500 mg by mouth 3 (three) times daily.    . chlorhexidine (PERIDEX) 0.12 % solution     . Cholecalciferol (VITAMIN D) 2000 units CAPS Take 2,000 Units by mouth daily.    . clopidogrel (PLAVIX) 75 MG tablet Take 1 tablet (75 mg total) by mouth daily. 90 tablet 3  . esomeprazole (NEXIUM) 20 MG capsule Take 20 mg by mouth daily.     . Fluticasone-Umeclidin-Vilant (TRELEGY ELLIPTA) 100-62.5-25 MCG/INH AEPB INHALE 1 PUFF BY MOUTH EVERY DAY 60 each 7  . Melatonin 5 MG CAPS Take 5 mg by mouth at bedtime.    . nitroGLYCERIN (NITROSTAT) 0.4 MG SL tablet Place 1 tablet (0.4 mg total) under the tongue every 5 (five) minutes as needed for chest pain. 25 tablet 11  . predniSONE (DELTASONE) 5 MG tablet Take 5 mg by mouth daily with breakfast.    . rosuvastatin (CRESTOR) 20 MG tablet TAKE 1 TABLET BY MOUTH EVERYDAY AT BEDTIME 90 tablet 1  . Ipratropium-Albuterol (COMBIVENT RESPIMAT) 20-100 MCG/ACT AERS respimat Inhale 1-2 puffs into the lungs every 6 (six) hours as needed for wheezing or shortness of breath.     Facility-Administered Medications Prior to Visit  Medication Dose Route Frequency Provider Last Rate Last Admin  . thallous chloride (201 THALLIUM) injection 4 milli Curie  4 millicurie Intravenous Once PRN Larey Dresser, MD        Review of Systems  Review of Systems  Constitutional: Negative.   HENT: Negative.   Respiratory: Positive for cough. Negative for chest tightness and wheezing.        Dyspnea in heat/humidity   Cardiovascular: Negative.     Physical Exam  BP 112/62 (BP Location: Left Arm)   Pulse 70   Temp (!) 97.3 F (36.3 C) (Oral)   Ht 5' 10.5" (1.791 m)   Wt 153 lb 9.6 oz (69.7 kg)   SpO2 99%   BMI 21.73 kg/m  Physical Exam Constitutional:      Appearance: Normal appearance.  HENT:     Head: Normocephalic and atraumatic.     Mouth/Throat:     Mouth: Mucous membranes are moist.     Pharynx: Oropharynx is clear.  Cardiovascular:     Rate and Rhythm: Normal  rate and regular rhythm.  Pulmonary:     Effort: Pulmonary effort is normal.     Breath sounds: Normal breath sounds. No wheezing.     Comments: Clear, diminished  Musculoskeletal:        General: Normal range of motion.  Skin:    General: Skin is warm and dry.  Neurological:     General: No focal deficit present.     Mental Status: He is alert and oriented to person, place, and time. Mental status is at baseline.  Psychiatric:        Mood and Affect: Mood normal.        Behavior: Behavior normal.        Thought Content: Thought content normal.        Judgment: Judgment normal.      Lab Results:  CBC    Component Value Date/Time   WBC 10.2 10/13/2019 1110   RBC 5.42 10/13/2019 1110   HGB 16.8 10/13/2019 1110   HGB 15.7 04/09/2018 1650  HCT 51.2 10/13/2019 1110   HCT 44.6 04/09/2018 1650   PLT 263 10/13/2019 1110   PLT 285 04/09/2018 1650   MCV 94.5 10/13/2019 1110   MCV 88 04/09/2018 1650   MCH 31.0 10/13/2019 1110   MCHC 32.8 10/13/2019 1110   RDW 13.4 10/13/2019 1110   RDW 13.2 04/09/2018 1650   LYMPHSABS 1.4 04/09/2018 1650   MONOABS 0.7 12/04/2010 0910   EOSABS 0.2 04/09/2018 1650   BASOSABS 0.1 04/09/2018 1650    BMET    Component Value Date/Time   NA 140 10/13/2019 1110   NA 142 06/06/2018 1012   K 4.4 10/13/2019 1110   CL 101 10/13/2019 1110   CO2 28 10/13/2019 1110   GLUCOSE 101 (H) 10/13/2019 1110   BUN 15 10/13/2019 1110   BUN 12 06/06/2018 1012   CREATININE 1.05 10/13/2019 1110   CALCIUM 9.1 10/13/2019 1110   GFRNONAA >60 10/13/2019 1110   GFRAA >60 10/13/2019 1110    BNP No results found for: BNP  ProBNP No results found for: PROBNP  Imaging: CT SUPER D CHEST WO CONTRAST  Result Date: 02/26/2020 CLINICAL DATA:  Follow-up lung nodules. Former smoker. Shortness of breath. EXAM: CT CHEST WITHOUT CONTRAST TECHNIQUE: Multidetector CT imaging of the chest was performed using thin slice collimation for electromagnetic bronchoscopy planning  purposes, without intravenous contrast. COMPARISON:  Chest CT 08/20/2019 FINDINGS: Cardiovascular: Normal heart size. No pericardial effusion. Aortic atherosclerosis. Three vessel coronary artery atherosclerotic calcifications. Mediastinum/Nodes: No mediastinal or axillary adenopathy.Normal appearance of the thyroid gland. The trachea appears patent and is midline. Irregular filling defect along the posterior and left lateral wall of the lower trachea containing foci of gas likely represents retained mucus. The esophagus appears within normal limits. Lungs/Pleura: Moderate to severe centrilobular and paraseptal emphysema. No pleural effusion, airspace consolidation or atelectasis. Left upper lobe lung nodule with central calcification measures 0.9 cm, image 68/8. This is stable from previous study. Adjacent dilated and impacted bronchial is identified which leads of to a new peribronchovascular nodule which measures 1.0 cm, image 72/8. Previously characterized right lower lobe peribronchovascular nodule with surrounding architectural distortion measures 1.8 x 1.0 cm, image 97/8. This appears more solid than on the previous exam when it measured 1.5 by 1.0 cm. New peribronchovascular nodule within the anterior medial right lower lobe measures 1.3 x 0.9 cm, image 97/8. Also new is a irregular area of architectural distortion, nodularity and ground-glass attenuation within the peribronchovascular right lower lobe measuring approximately 1.9 x 1.1 cm, image 104/8. Previously characterized nodule within the left lower lobe measures 1.0 x 1.3 cm, image 95/8. This is compared with 0.8 x 1.1 cm previously. Previously noted nodule within the paravertebral right lower lobe measures 4 mm on today's study. Previously this measured 5 mm. Within the posterolateral right upper lobe previous index nodule measures 1 cm, image 86/8. Stable. Upper Abdomen: No acute abnormality identified within the imaged portions of the upper  abdomen. Right kidney cyst is again noted and only partially visualized. Musculoskeletal: Multi level degenerative disc disease identified. No acute or suspicious osseous findings. IMPRESSION: 1. Multiple, bilateral irregular peribronchovascular pulmonary nodular densities are again noted. Compared with previous exam these appear increased in size and number including new nodules in the left upper lobe and right lower lobe. The location and appearance of these nodular densities is suggestive of postinflammatory/infectious process. However, as this patient is at increased risk for lung cancer and given the progressive nature of these nodules, underlying malignancy cannot be excluded.  In the absence of signs or symptoms of infection further investigation with either PET-CT, bronchoscopy, or short-term interval follow-up with repeat CT of the chest in 3 months is advised. 2. 4 mm lung nodule within the paravertebral right lower lobe is decreased in size from the previous exam compatible with a benign process. Peripheral, posterolateral right upper lobe lung nodule is stable at 0.9 cm 3. Three vessel coronary artery atherosclerotic calcifications noted. Aortic Atherosclerosis (ICD10-I70.0) and Emphysema (ICD10-J43.9). Electronically Signed   By: Kerby Moors M.D.   On: 02/26/2020 11:48     Assessment & Plan:   COPD (chronic obstructive pulmonary disease) ([facility]) - Increased shortness of breath on exertion with heat/humanity. Absent AECOPD symptoms.   - Trial Breztri two puffs twice daily (rinse mouth after use) - Continue 5mg  prednisone daily  - FU in 3 months with Dr. Lamonte Sakai   Chronic respiratory failure with hypoxia ([place]) - Ambulatory walk today desaturated to 86% on RA after 2 laps normal pace. O2 94% on 2L. Recovers quickly with rest.  - Sending in new order for 2L oxygen on exertion to DME company   Pulmonary nodules - PET positive RLL nodule SUV 9.5. Bronchoscopy consistent with granulomatous  disease. Unclear origin, TB GOLD negative - Repeat CT chest showed multiple bilateral irregular peribronchovascular pulmonary nodular densities.  Compared to previous exam these appear increased in size and number. Location and appearance of nodular densities suggest postinflammatory/infectious process but can not rule out malignancy  - I will discuss with Dr. Lamonte Sakai- consider PET scan, bronchoscopy or repeat CT imaging in 3 months      Martyn Ehrich, NP 03/01/2020

## 2020-03-01 NOTE — Addendum Note (Signed)
Addended by: Coralie Keens on: 03/01/2020 10:58 AM   Modules accepted: Orders

## 2020-03-01 NOTE — Assessment & Plan Note (Signed)
-   PET positive RLL nodule SUV 9.5. Bronchoscopy consistent with granulomatous disease. Unclear origin, TB GOLD negative - Repeat CT chest showed multiple bilateral irregular peribronchovascular pulmonary nodular densities.  Compared to previous exam these appear increased in size and number. Location and appearance of nodular densities suggest postinflammatory/infectious process but can not rule out malignancy  - I will discuss with Dr. Lamonte Sakai- consider PET scan, bronchoscopy or repeat CT imaging in 3 months

## 2020-03-01 NOTE — Patient Instructions (Addendum)
Pleasure meeting you [name] [name]  Repeat CT chest showed multiple bilateral irregular peribronchovascular pulmonary nodular densities.  Compared to previous exam these appear increased in size and number. Location and appearance of nodular densities suggest postinflammatory/infectious process but can not rule out malignancy   I will discuss with Dr. Lamonte Sakai- consider PET scan, bronchoscopy or repeat CT imaging in 3 months  Recommendations: - Hold Trelegy while taking Breztri - Start trial [employer] tomorrow take two puffs morning and evening (rinse mouth after use) (If you prefer this to Trelegy please let us know and we will send in RX- you can call insurance to see if it is covered, if not will need to send in prior auth for medication)   Orders: Ambulatory walk test  Incentive spirometer   Follow-up: 3 months with Dr. Lamonte Sakai

## 2020-03-01 NOTE — Addendum Note (Signed)
Addended by: Coralie Keens on: 03/01/2020 11:43 AM   Modules accepted: Orders

## 2020-03-04 NOTE — Telephone Encounter (Signed)
I reviewed his scans - difficult case. His nodules are more prominent on the most recent scan. Last FOB with granulomas on the R, negative on the L, no AFB done. His PET was hot last time, probably will be again so not sure that helps [place].   I'd like for him to consider repeat FOB, think about it and then we should discuss next week.

## 2020-03-07 NOTE — Telephone Encounter (Signed)
PCC's can you please help with this? Looks like now he wants order for POC to be sent to [facility] and not [facility]

## 2020-03-07 NOTE — Telephone Encounter (Signed)
Please let patient know I discussed pulmonary nodules with Dr. Lamonte Sakai and he would like patient to consider repeat bronchoscopy to re-culture. He does not want to repeat PET. Please have him think about this and touch base with [place] this week. You can set him up with a telephone visit with either me or [facility] to discuss further if he'd like. Thanks

## 2020-03-07 NOTE — Telephone Encounter (Signed)
LMTCB x 1 and will hold in triage

## 2020-03-07 NOTE — Telephone Encounter (Signed)
Msg sent to [facility] 

## 2020-03-08 MED ORDER — BREZTRI AEROSPHERE 160-9-4.8 MCG/ACT IN AERO: 2.0000 | INHALATION_SPRAY | Freq: Two times a day (BID) | RESPIRATORY_TRACT | 5 refills | Status: DC

## 2020-03-08 NOTE — Telephone Encounter (Signed)
Can you guys try calling him again today and relay message from Dr. Lamonte Sakai

## 2020-03-08 NOTE — Telephone Encounter (Signed)
Called and spoke with pt in regards to message from [place] and [place]. Pt stated he would like to schedule a phone visit to further discuss. appt has been scheduled for pt with [facility] 8/19. Nothing further needed.

## 2020-03-09 ENCOUNTER — Telehealth: Payer: Self-pay | Admitting: Primary Care

## 2020-03-09 NOTE — Telephone Encounter (Signed)
Patient was already contacted, see previous encounter from [place].

## 2020-03-10 ENCOUNTER — Telehealth: Payer: Self-pay | Admitting: Primary Care

## 2020-03-10 DIAGNOSIS — J9611 Chronic respiratory failure with hypoxia: Secondary | ICD-10-CM

## 2020-03-10 NOTE — Telephone Encounter (Signed)
Order already placed for this to go to [place] today  I spoke with the pt and notified that this was done  He verbalized understanding

## 2020-03-16 ENCOUNTER — Telehealth: Payer: Self-pay | Admitting: Cardiology

## 2020-03-16 ENCOUNTER — Telehealth: Payer: Self-pay | Admitting: Primary Care

## 2020-03-16 DIAGNOSIS — J9611 Chronic respiratory failure with hypoxia: Secondary | ICD-10-CM

## 2020-03-16 NOTE — Telephone Encounter (Signed)
Patient is filling out medical forms and needs to know the date that Dr. Geraldo Pitter put two stents in his artery. You can leave a VM if he does not answer.

## 2020-03-16 NOTE — Telephone Encounter (Signed)
[name] [name] with [employer] is aware of below message.  She requested that goal saturations be placed in order.  Order has been corrected and placed to [place].  Nothing further is needed at this time.

## 2020-03-16 NOTE — Telephone Encounter (Signed)
Received a call from [place] with [employer].  She said that Derl Barrow NP ordered best fit portable oxyen with [facility].  She needs to know what the goal is for oxygen saturation.  Beth, Please advise what you want his sats above.  Thank you.

## 2020-03-16 NOTE — Telephone Encounter (Signed)
Goal O2 >88-92%

## 2020-03-17 ENCOUNTER — Ambulatory Visit (INDEPENDENT_AMBULATORY_CARE_PROVIDER_SITE_OTHER): Payer: Medicare Other | Admitting: Primary Care

## 2020-03-17 ENCOUNTER — Other Ambulatory Visit: Payer: Self-pay

## 2020-03-17 ENCOUNTER — Encounter: Payer: Self-pay | Admitting: Primary Care

## 2020-03-17 ENCOUNTER — Telehealth: Payer: Self-pay | Admitting: Primary Care

## 2020-03-17 DIAGNOSIS — J431 Panlobular emphysema: Secondary | ICD-10-CM | POA: Diagnosis not present

## 2020-03-17 DIAGNOSIS — R918 Other nonspecific abnormal finding of lung field: Secondary | ICD-10-CM

## 2020-03-17 DIAGNOSIS — J9611 Chronic respiratory failure with hypoxia: Secondary | ICD-10-CM

## 2020-03-17 NOTE — Telephone Encounter (Signed)
Waiting for [place], he will fill out dot phase

## 2020-03-17 NOTE — H&P (View-Only) (Signed)
Virtual Visit via Telephone Note  I connected with [name] [name]. on 03/17/20 at  9:30 AM EDT by telephone and verified that I am speaking with the correct person using two identifiers.  Location: Patient: Home Provider: Office   I discussed the limitations, risks, security and privacy concerns of performing an evaluation and management service by telephone and the availability of in person appointments. I also discussed with the patient that there may be a patient responsible charge related to this service. The patient expressed understanding and agreed to proceed.   History of Present Illness:  79 year old male, former smoker quit in January 2021 (10-pack-year history).  Past medical history significant for COPD, chronic respiratory failure with hypoxia, GERD, dysphagia, sleep disorder, pulmonary nodules, mixed hyperlipidemia, hypertension, coronary arthrosclerosis.  Patient of Dr. Lamonte Sakai, last seen in April 2021.  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  Unclear etiology, quantiferon Gold negative. There is still concern for possible malignancy, 6 month repeat CT chest scheduled for August 2021. Maintained on Trelegy Ellipta and prn albuterol.   Previous [facility] pulmonary encounters:  03/01/2020 Patient presents today for 46-monthfollow-up visit for COPD/pulmonary nodules. He had PET scan in February 2021 which showed hypermetabolic nodule RLL,SUV 9.5. He underwent bronchoscopy in March 2021 by Dr. BLamonte Sakaithat was consistent with granulomatous disease. Repeat Super-D CT chest on 02/26/2020 showed multiple bilateral irregular peribronchovascular pulmonary nodular densities.  Compared to previous exam these appear increased in size and number, including new nodules to left upper lobe and right lower lobe.  Location and appearance of nodular densities suggest postinflammatory/infectious process.   He called our office on 02/07/20 with  complaints of increased shortness of breath when outside in heat/humidity. He feels he may need oxygen. Otherwise he feels well. No acute bronchitis symptoms. He coughs up clear mucus once or twice a day. He continues to play golf every day. He is compliant with Trelegy Ellipta. Denies fever, sweats, chest tightness or wheezing.   03/17/2020- Interim hx Patient contacted today for televisit. Dr. BLamonte Sakaireviewed his scans, his nodules are more prominent on the most recent CT scan. Last flexible bronchoscopy showed granulomas on the right, negative on the left. No AFB done. His PET was positive. He would like patient to reconsider FOB and discuss further with [place] Reviewed Super-D CT scan with patient and Dr. BAgustina Carolirecommendations. He is on board with proceeding with re-peat bronchoscopy. He will be unable on August 24th and between August 31-September 10th d/t skin surgery for squamous cell carcinoma.   In terms of his breath, nothing major has changed. He has noticed an improvement with Breztri. He is currently finishing up samples and has filled a month supply. He does have 2-3 months worth of Trelegy medication already and I told patient that he can finish these inhalers but not at the same time he is taking Breztri. Picked up oxygen concentrator for [employer] He is on RA at rest; needs oxygen on exertion especially in the heat/humidity. He is still active playing golf.     Observations/Objective:  - Appears well; no overt shortness of breath or wheezing  Imaging: 09/21/19 PET scan- No thoracic nodal hypermetabolism. The irregular masslike opacity within the right lower lobe is hypermetabolic and has increased compared to 08/20/2019. Measures 3.4 x 2.1 cm and a S.U.V. max of 9.5   02/26/20 Super D - Multiple, bilateral irregular peribronchovascular pulmonary nodular densities are again noted. Compared with previous exam these appear  increased in size and number including new nodules in the left upper  lobe and right lower lobe. The location and appearance of these nodular densities is suggestive of postinflammatory/infectious process. However, as this patient is at increased risk for lung cancer and given the progressive nature of these nodules, underlying malignancy cannot be excluded  Assessment and Plan:  Pulmonary nodules: - Super-D CT in July 2021 showed increased size and number of nodular densities. He had bronchoscopy in March that was consistent with granulomatous disease. Reviewed Super-D imaging with Dr. Lamonte Sakai, he is recommending repeat FOB. Patient agrees with moving further with bronchoscopy for tissue sampling/cultures (no prior AFB). Discussed potential risk related to procedure, he has had previous bronchoscopies and has no new questions. Patient is no on blood thinner other than plavix.   COPD: - Stable; Reports improvement with Michel Bickers - Ok for patient to use up Trelegy medication and then he will stay on Breztri (reinforced inhalers not to be used at the same time)  Chronic respiratory failure - Worse on exertion/heat and humiditiy - Patient recent purchased POC from [place]  - Goal O2 88-92%   Follow Up Instructions:   - Follow up after bronchoscopy  I discussed the assessment and treatment plan with the patient. The patient was provided an opportunity to ask questions and all were answered. The patient agreed with the plan and demonstrated an understanding of the instructions.   The patient was advised to call back or seek an in-person evaluation if the symptoms worsen or if the condition fails to improve as anticipated.  I provided 18 minutes of non-face-to-face time during this encounter.   Martyn Ehrich, NP

## 2020-03-17 NOTE — Patient Instructions (Signed)
We will contact you with procedural date   Flexible Bronchoscopy  Flexible bronchoscopy is a procedure that is used to examine the passageways in the lungs. During the procedure, a thin, flexible tool with a camera on it (bronchoscope) is passed into the mouth or nose, down through the windpipe (trachea), and into the air tubes (bronchi) in the lungs. This tool allows your health care provider to look at your lungs from the inside and take testing (diagnostic) samples if needed. Tell a health care provider about:  Any allergies you have.  All medicines you are taking, including vitamins, herbs, eye drops, creams, and over-the-counter medicines.  Any problems you or family members have had with anesthetic medicines.  Any blood disorders you have.  Any surgeries you have had.  Any medical conditions you have.  Whether you are pregnant or may be pregnant. What are the risks? Generally, this is a safe procedure. However, problems may occur, including:  Infection.  Bleeding.  Damage to other structures or organs.  Allergic reactions to medicines.  Collapsed lung (pneumothorax).  Increased need for oxygen or difficulty breathing after the procedure. What happens before the procedure? Medicines Ask your health care provider about:  Changing or stopping your regular medicines. This is especially important if you are taking diabetes medicines or blood thinners.  Taking medicines such as aspirin and ibuprofen. These medicines can thin your blood. Do not take these medicines before your procedure if your health care provider instructs you not to. You may be given antibiotic medicine to help prevent infection. Staying hydrated Follow instructions from your health care provider about hydration, which may include:  Up to 2 hours before the procedure - you may continue to drink clear liquids, such as water, clear fruit juice, black coffee, and plain tea. Eating and drinking Follow  instructions from your health care provider about eating and drinking, which may include:  8 hours before the procedure - stop eating heavy meals or foods such as meat, fried foods, or fatty foods.  6 hours before the procedure - stop eating light meals or foods, such as toast or cereal.  6 hours before the procedure - stop drinking milk or drinks that contain milk.  2 hours before the procedure - stop drinking clear liquids. General instructions  Plan to have someone take you home from the hospital or clinic.  If you will be going home right after the procedure, plan to have someone with you for 24 hours. What happens during the procedure?  To lower your risk of infection: ? Your health care team will wash or sanitize their hands. ? Your skin will be washed with soap.  An IV tube will be inserted into one of your veins.  You will be given a medicine (local anesthetic) to numb your mouth, nose, throat, and voice box (larynx). You may also be given one or more of the following: ? A medicine to help you relax (sedative). ? A medicine to control coughing. ? A medicine to dry up any fluids in your lungs (secretions).  A bronchoscope will be passed into your nose or mouth, and into your lungs. Your health care provider will examine your lungs.  Samples of airway secretions may be collected for testing.  If abnormal areas are seen in your airways, tissue samples may be removed for examination under a microscope (biopsy).  If tissue samples are needed from the outer parts of the lung, a type of X-ray (fluoroscopy) may be used to  guide the bronchoscope to these areas.  If bleeding occurs, you may be given medicine to stop or decrease the bleeding. The procedure may vary among health care providers and hospitals. What happens after the procedure?  Do not drive for 24 hours if you were given a sedative.  Your blood pressure, heart rate, breathing rate, and blood oxygen level will be  monitored until the medicines you were given have worn off.  You may have a chest X-ray to check for signs of pneumothorax.  You will not be allowed to eat or drink anything for 2 hours after your procedure.  If a biopsy was taken, it is up to you to get the results of your procedure. Ask your health care provider, or the department that is doing the procedure, when your results will be ready. Summary  Flexible bronchoscopy is a procedure that allows your health care provider to look closely at your lungs from the inside and take testing (diagnostic) samples if needed.  Risks of flexible bronchoscopy include bleeding, infection, and pneumothorax.  Before a flexible bronchoscopy, you will be given a medicine (local anesthetic) to numb your mouth, nose, throat, and voice box (larynx). Then, a bronchoscope will be passed into your nose or mouth, and into your lungs.  After the procedure, your blood pressure, heart rate, breathing rate, and blood oxygen level will be monitored until the medicines you were given have worn off. You may have a chest X-ray to check for signs of pneumothorax.  You will not be allowed to eat or drink anything for 2 hours after your procedure. This information is not intended to replace advice given to you by your health care provider. Make sure you discuss any questions you have with your health care provider. Document Revised: 06/28/2017 Document Reviewed: 08/18/2016 Elsevier Patient Education  2020 [employer].

## 2020-03-17 NOTE — Telephone Encounter (Signed)
Spoke to patients wife just now and let her know that the patient's stents were placed on 04/15/18. She verbalizes understanding and states that she will let her husband know. No other issues or concerns were noted at this time.

## 2020-03-17 NOTE — Telephone Encounter (Signed)
04/15/2018 °

## 2020-03-17 NOTE — Telephone Encounter (Signed)
Beth please advise on patient mychart message  This is to follow up on our phone conversation of a few minutes ago. Thursday, Aug. 26 is also not a good day for this procedure, so it looks like we are waiting until some time after the 10th of September.   I also have another concern that I need to express. This past Tuesday, my wife and I signed contract papers and paid a deposit on a living unit at the [facility] in [place]. This is, as you probably know, a continuing care community. They now have  4 months to ready our villa to our specifications before we move in. Sometime maybe around Christmas.   As part of the overall process we have to provide a great deal of information about ourselves including our current health situation. Like the proverbial ostrich, what I don't know can't hurt me nor can I inform others of what I don't know. Their contract states that an omission of a change in health prior to moving in is a cause for their terminating the contract. Should I and can I afford to postpone the procedure until after we have moved into [facility].

## 2020-03-17 NOTE — Progress Notes (Signed)
Virtual Visit via Telephone Note  I connected with [name] [name]. on 03/17/20 at  9:30 AM EDT by telephone and verified that I am speaking with the correct person using two identifiers.  Location: Patient: Home Provider: Office   I discussed the limitations, risks, security and privacy concerns of performing an evaluation and management service by telephone and the availability of in person appointments. I also discussed with the patient that there may be a patient responsible charge related to this service. The patient expressed understanding and agreed to proceed.   History of Present Illness:  79 year old male, former smoker quit in January 2021 (10-pack-year history).  Past medical history significant for COPD, chronic respiratory failure with hypoxia, GERD, dysphagia, sleep disorder, pulmonary nodules, mixed hyperlipidemia, hypertension, coronary arthrosclerosis.  Patient of Dr. Lamonte Sakai, last seen in April 2021.  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  Unclear etiology, quantiferon Gold negative. There is still concern for possible malignancy, 6 month repeat CT chest scheduled for August 2021. Maintained on Trelegy Ellipta and prn albuterol.   Previous [facility] pulmonary encounters:  03/01/2020 Patient presents today for 90-monthfollow-up visit for COPD/pulmonary nodules. He had PET scan in February 2021 which showed hypermetabolic nodule RLL,SUV 9.5. He underwent bronchoscopy in March 2021 by Dr. BLamonte Sakaithat was consistent with granulomatous disease. Repeat Super-D CT chest on 02/26/2020 showed multiple bilateral irregular peribronchovascular pulmonary nodular densities.  Compared to previous exam these appear increased in size and number, including new nodules to left upper lobe and right lower lobe.  Location and appearance of nodular densities suggest postinflammatory/infectious process.   He called our office on 02/07/20 with  complaints of increased shortness of breath when outside in heat/humidity. He feels he may need oxygen. Otherwise he feels well. No acute bronchitis symptoms. He coughs up clear mucus once or twice a day. He continues to play golf every day. He is compliant with Trelegy Ellipta. Denies fever, sweats, chest tightness or wheezing.   03/17/2020- Interim hx Patient contacted today for televisit. Dr. BLamonte Sakaireviewed his scans, his nodules are more prominent on the most recent CT scan. Last flexible bronchoscopy showed granulomas on the right, negative on the left. No AFB done. His PET was positive. He would like patient to reconsider FOB and discuss further with [place] Reviewed Super-D CT scan with patient and Dr. BAgustina Carolirecommendations. He is on board with proceeding with re-peat bronchoscopy. He will be unable on August 24th and between August 31-September 10th d/t skin surgery for squamous cell carcinoma.   In terms of his breath, nothing major has changed. He has noticed an improvement with Breztri. He is currently finishing up samples and has filled a month supply. He does have 2-3 months worth of Trelegy medication already and I told patient that he can finish these inhalers but not at the same time he is taking Breztri. Picked up oxygen concentrator for [employer] He is on RA at rest; needs oxygen on exertion especially in the heat/humidity. He is still active playing golf.     Observations/Objective:  - Appears well; no overt shortness of breath or wheezing  Imaging: 09/21/19 PET scan- No thoracic nodal hypermetabolism. The irregular masslike opacity within the right lower lobe is hypermetabolic and has increased compared to 08/20/2019. Measures 3.4 x 2.1 cm and a S.U.V. max of 9.5   02/26/20 Super D - Multiple, bilateral irregular peribronchovascular pulmonary nodular densities are again noted. Compared with previous exam these appear  increased in size and number including new nodules in the left upper  lobe and right lower lobe. The location and appearance of these nodular densities is suggestive of postinflammatory/infectious process. However, as this patient is at increased risk for lung cancer and given the progressive nature of these nodules, underlying malignancy cannot be excluded  Assessment and Plan:  Pulmonary nodules: - Super-D CT in July 2021 showed increased size and number of nodular densities. He had bronchoscopy in March that was consistent with granulomatous disease. Reviewed Super-D imaging with Dr. Lamonte Sakai, he is recommending repeat FOB. Patient agrees with moving further with bronchoscopy for tissue sampling/cultures (no prior AFB). Discussed potential risk related to procedure, he has had previous bronchoscopies and has no new questions. Patient is no on blood thinner other than plavix.   COPD: - Stable; Reports improvement with Michel Bickers - Ok for patient to use up Trelegy medication and then he will stay on Breztri (reinforced inhalers not to be used at the same time)  Chronic respiratory failure - Worse on exertion/heat and humiditiy - Patient recent purchased POC from [place]  - Goal O2 88-92%   Follow Up Instructions:   - Follow up after bronchoscopy  I discussed the assessment and treatment plan with the patient. The patient was provided an opportunity to ask questions and all were answered. The patient agreed with the plan and demonstrated an understanding of the instructions.   The patient was advised to call back or seek an in-person evaluation if the symptoms worsen or if the condition fails to improve as anticipated.  I provided 18 minutes of non-face-to-face time during this encounter.   Martyn Ehrich, NP

## 2020-03-17 NOTE — Telephone Encounter (Signed)
Follow up    Patient is calling to follow up on the dates that he had the two stents.

## 2020-03-17 NOTE — Telephone Encounter (Signed)
Patient of yours Dr. Lamonte Sakai, he hade increase nodular densities on most recent Super-D CT in July 2021. You had recommended repeating FOB. He had a bronchoscopy in March 2021 that was consistent with granulomatous disease- unclear origin. Patient is agreeing to proceed with flexible bronchoscopy.   He will be unable August 24th and August 31-September 10th.   Cc: Lauren/[facility] procedural pool

## 2020-03-18 ENCOUNTER — Telehealth: Payer: Self-pay | Admitting: Emergency Medicine

## 2020-03-18 DIAGNOSIS — R911 Solitary pulmonary nodule: Secondary | ICD-10-CM

## 2020-03-18 DIAGNOSIS — Z5181 Encounter for therapeutic drug level monitoring: Secondary | ICD-10-CM

## 2020-03-18 NOTE — Telephone Encounter (Signed)
Thank you :)

## 2020-03-18 NOTE — Telephone Encounter (Signed)
This will need to be directed to Dr. Lamonte Sakai. It would be up to him if he could postpone

## 2020-03-18 NOTE — Telephone Encounter (Signed)
I will work on setting up [place] for 04/12/20

## 2020-03-18 NOTE — Telephone Encounter (Signed)
Pt has been scheduled for ENB on 9/14 at 7:30, covid test scheduled for 9/11.  I spoke to Chrystal at [facility] and she said Hildred Alamin is going to burn disk and send to [place].  I told wife pt needs to stop plavix 5 days prior to procedure.  Lauren can you put in lab orders please?  I told wife he needs to come week of 9/6 to get labs.

## 2020-03-21 NOTE — Telephone Encounter (Signed)
Orders have been placed, they will be ready for when patient comes into office

## 2020-04-05 ENCOUNTER — Other Ambulatory Visit (INDEPENDENT_AMBULATORY_CARE_PROVIDER_SITE_OTHER): Payer: Medicare Other

## 2020-04-05 DIAGNOSIS — R911 Solitary pulmonary nodule: Secondary | ICD-10-CM | POA: Diagnosis not present

## 2020-04-05 DIAGNOSIS — Z5181 Encounter for therapeutic drug level monitoring: Secondary | ICD-10-CM | POA: Diagnosis not present

## 2020-04-05 LAB — PROTIME-INR
INR: 1.1 ratio — ABNORMAL HIGH (ref 0.8–1.0)
Prothrombin Time: 12 s (ref 9.6–13.1)

## 2020-04-05 LAB — CBC WITH DIFFERENTIAL/PLATELET
Basophils Absolute: 0.1 10*3/uL (ref 0.0–0.1)
Basophils Relative: 0.7 % (ref 0.0–3.0)
Eosinophils Absolute: 0.1 10*3/uL (ref 0.0–0.7)
Eosinophils Relative: 1.6 % (ref 0.0–5.0)
HCT: 47.2 % (ref 39.0–52.0)
Hemoglobin: 15.9 g/dL (ref 13.0–17.0)
Lymphocytes Relative: 16.1 % (ref 12.0–46.0)
Lymphs Abs: 1.4 10*3/uL (ref 0.7–4.0)
MCHC: 33.8 g/dL (ref 30.0–36.0)
MCV: 90.8 fl (ref 78.0–100.0)
Monocytes Absolute: 0.6 10*3/uL (ref 0.1–1.0)
Monocytes Relative: 6.7 % (ref 3.0–12.0)
Neutro Abs: 6.4 10*3/uL (ref 1.4–7.7)
Neutrophils Relative %: 74.9 % (ref 43.0–77.0)
Platelets: 298 10*3/uL (ref 150.0–400.0)
RBC: 5.2 Mil/uL (ref 4.22–5.81)
RDW: 14.6 % (ref 11.5–15.5)
WBC: 8.5 10*3/uL (ref 4.0–10.5)

## 2020-04-05 LAB — COMPREHENSIVE METABOLIC PANEL
ALT: 12 U/L (ref 0–53)
AST: 16 U/L (ref 0–37)
Albumin: 4.4 g/dL (ref 3.5–5.2)
Alkaline Phosphatase: 65 U/L (ref 39–117)
BUN: 19 mg/dL (ref 6–23)
CO2: 31 mEq/L (ref 19–32)
Calcium: 9.8 mg/dL (ref 8.4–10.5)
Chloride: 100 mEq/L (ref 96–112)
Creatinine, Ser: 1.07 mg/dL (ref 0.40–1.50)
GFR: 66.66 mL/min (ref >60.00)
Glucose, Bld: 102 mg/dL — ABNORMAL HIGH (ref 70–99)
Potassium: 4.3 mEq/L (ref 3.5–5.1)
Sodium: 140 mEq/L (ref 135–145)
Total Bilirubin: 0.6 mg/dL (ref 0.2–1.2)
Total Protein: 7.2 g/dL (ref 6.0–8.3)

## 2020-04-05 LAB — APTT: aPTT: 31.8 s (ref 23.4–32.7)

## 2020-04-06 NOTE — Telephone Encounter (Signed)
Dr. Lamonte Sakai please see patient mychart message in response to your message.   Tell Dr. Lamonte Sakai that I need a day to respond to his message that we might be able to postpone this test. I will let him know asap as to whether we will continue or postpone until later in the year. I feel better that he did not say that this was imperative that we do it now.           [name] [name],  Thanks for this information. I think it is reasonable for [place] to consider postponing if that puts you in the best position with [facility]. We just need to understand the potential drawbacks - namely that if these nodules do reflect cancerous process then they could enlarge or even spread in the interim. I do acknowledge that however they are changing it is happening very slowly, which is why I would be willing to postpone, possibly repeat a CT chest late in the year, if that is what works best for you on the whole. Let our office know what you would like to do.   Thanks, Dr B

## 2020-04-08 ENCOUNTER — Encounter (HOSPITAL_COMMUNITY): Payer: Self-pay | Admitting: Emergency Medicine

## 2020-04-08 ENCOUNTER — Other Ambulatory Visit: Payer: Self-pay

## 2020-04-08 NOTE — Progress Notes (Signed)
Spoke with pt for pre-op call. Pt has hx of CAD with stents (2019). Pt's cardiologist is Dr. Geraldo Pitter, last office visit was 11/12/19. Pt denies chest pain, states he is always short of breath. Pt is on Plavix and was instructed to hold Plavix 5 days prior to procedure, last dose was 04/06/20. Pt states he is not diabetic.   Covid test scheduled for 04/09/20. Pt instructed to quarantine after he gets the Covid test done and to stay in quarantine until he comes to the hospital on Tuesday.

## 2020-04-09 ENCOUNTER — Other Ambulatory Visit (HOSPITAL_COMMUNITY)
Admission: RE | Admit: 2020-04-09 | Discharge: 2020-04-09 | Disposition: A | Discharge status: Home / Self Care | Payer: Medicare Other | Source: Ambulatory Visit | Attending: Emergency Medicine | Admitting: Emergency Medicine

## 2020-04-09 DIAGNOSIS — Z20822 Contact with and (suspected) exposure to covid-19: Secondary | ICD-10-CM | POA: Insufficient documentation

## 2020-04-09 DIAGNOSIS — Z01818 Encounter for other preprocedural examination: Secondary | ICD-10-CM | POA: Insufficient documentation

## 2020-04-09 LAB — SARS CORONAVIRUS 2 (TAT 6-24 HRS): SARS Coronavirus 2: NEGATIVE

## 2020-04-12 ENCOUNTER — Other Ambulatory Visit: Payer: Self-pay | Admitting: Pulmonary Disease

## 2020-04-12 ENCOUNTER — Ambulatory Visit (HOSPITAL_COMMUNITY): Payer: Medicare Other | Admitting: Anesthesiology

## 2020-04-12 ENCOUNTER — Ambulatory Visit (HOSPITAL_COMMUNITY): Payer: Medicare Other

## 2020-04-12 ENCOUNTER — Other Ambulatory Visit: Payer: Self-pay

## 2020-04-12 ENCOUNTER — Inpatient Hospital Stay (HOSPITAL_COMMUNITY)
Admission: RE | Admit: 2020-04-12 | Discharge: 2020-04-13 | DRG: 167 | Disposition: A | Discharge status: Home / Self Care | Payer: Medicare Other | Attending: Emergency Medicine | Admitting: Emergency Medicine

## 2020-04-12 ENCOUNTER — Encounter (HOSPITAL_COMMUNITY): Payer: Self-pay | Admitting: Emergency Medicine

## 2020-04-12 ENCOUNTER — Inpatient Hospital Stay (HOSPITAL_COMMUNITY): Payer: Medicare Other

## 2020-04-12 ENCOUNTER — Encounter (HOSPITAL_COMMUNITY)
Admission: RE | Disposition: A | Discharge status: Home / Self Care | Payer: Self-pay | Source: Home / Self Care | Attending: Emergency Medicine

## 2020-04-12 DIAGNOSIS — J939 Pneumothorax, unspecified: Secondary | ICD-10-CM

## 2020-04-12 DIAGNOSIS — Z9889 Other specified postprocedural states: Secondary | ICD-10-CM

## 2020-04-12 DIAGNOSIS — G479 Sleep disorder, unspecified: Secondary | ICD-10-CM | POA: Diagnosis present

## 2020-04-12 DIAGNOSIS — J449 Chronic obstructive pulmonary disease, unspecified: Secondary | ICD-10-CM | POA: Diagnosis present

## 2020-04-12 DIAGNOSIS — J95811 Postprocedural pneumothorax: Principal | ICD-10-CM | POA: Diagnosis present

## 2020-04-12 DIAGNOSIS — I10 Essential (primary) hypertension: Secondary | ICD-10-CM | POA: Diagnosis present

## 2020-04-12 DIAGNOSIS — R739 Hyperglycemia, unspecified: Secondary | ICD-10-CM | POA: Diagnosis present

## 2020-04-12 DIAGNOSIS — Z85828 Personal history of other malignant neoplasm of skin: Secondary | ICD-10-CM

## 2020-04-12 DIAGNOSIS — I2510 Atherosclerotic heart disease of native coronary artery without angina pectoris: Secondary | ICD-10-CM | POA: Diagnosis present

## 2020-04-12 DIAGNOSIS — E782 Mixed hyperlipidemia: Secondary | ICD-10-CM | POA: Diagnosis present

## 2020-04-12 DIAGNOSIS — Z9689 Presence of other specified functional implants: Secondary | ICD-10-CM

## 2020-04-12 DIAGNOSIS — Z87891 Personal history of nicotine dependence: Secondary | ICD-10-CM

## 2020-04-12 DIAGNOSIS — K219 Gastro-esophageal reflux disease without esophagitis: Secondary | ICD-10-CM | POA: Diagnosis present

## 2020-04-12 DIAGNOSIS — J9611 Chronic respiratory failure with hypoxia: Secondary | ICD-10-CM | POA: Diagnosis present

## 2020-04-12 DIAGNOSIS — R1310 Dysphagia, unspecified: Secondary | ICD-10-CM | POA: Diagnosis present

## 2020-04-12 DIAGNOSIS — Z20822 Contact with and (suspected) exposure to covid-19: Secondary | ICD-10-CM | POA: Diagnosis present

## 2020-04-12 DIAGNOSIS — Z23 Encounter for immunization: Secondary | ICD-10-CM | POA: Diagnosis present

## 2020-04-12 DIAGNOSIS — R918 Other nonspecific abnormal finding of lung field: Secondary | ICD-10-CM | POA: Diagnosis present

## 2020-04-12 DIAGNOSIS — J441 Chronic obstructive pulmonary disease with (acute) exacerbation: Secondary | ICD-10-CM | POA: Diagnosis not present

## 2020-04-12 HISTORY — DX: Pneumothorax, unspecified: J93.9

## 2020-04-12 HISTORY — DX: Malignant (primary) neoplasm, unspecified: C80.1

## 2020-04-12 HISTORY — DX: Other specified postprocedural states: Z98.890

## 2020-04-12 HISTORY — DX: Pneumonia, unspecified organism: J18.9

## 2020-04-12 LAB — CBC
HCT: 46.3 % (ref 39.0–52.0)
Hemoglobin: 14.7 g/dL (ref 13.0–17.0)
MCH: 29.8 pg (ref 26.0–34.0)
MCHC: 31.7 g/dL (ref 30.0–36.0)
MCV: 93.7 fL (ref 80.0–100.0)
Platelets: 253 10*3/uL (ref 150–400)
RBC: 4.94 MIL/uL (ref 4.22–5.81)
RDW: 13.6 % (ref 11.5–15.5)
WBC: 7.8 10*3/uL (ref 4.0–10.5)
nRBC: 0 % (ref 0.0–0.2)

## 2020-04-12 LAB — COMPREHENSIVE METABOLIC PANEL
ALT: <5 U/L (ref 0–44)
AST: 18 U/L (ref 15–41)
Albumin: 3.5 g/dL (ref 3.5–5.0)
Alkaline Phosphatase: 58 U/L (ref 38–126)
Anion gap: 8 (ref 5–15)
BUN: 14 mg/dL (ref 8–23)
CO2: 30 mmol/L (ref 22–32)
Calcium: 9 mg/dL (ref 8.9–10.3)
Chloride: 102 mmol/L (ref 98–111)
Creatinine, Ser: 1.18 mg/dL (ref 0.61–1.24)
GFR calc Af Amer: >60 mL/min (ref >60)
GFR calc non Af Amer: 58 mL/min — ABNORMAL LOW (ref >60)
Glucose, Bld: 95 mg/dL (ref 70–99)
Potassium: 3.7 mmol/L (ref 3.5–5.1)
Sodium: 140 mmol/L (ref 135–145)
Total Bilirubin: 0.5 mg/dL (ref 0.3–1.2)
Total Protein: 5.5 g/dL — ABNORMAL LOW (ref 6.5–8.1)

## 2020-04-12 SURGERY — VIDEO BRONCHOSCOPY WITH ENDOBRONCHIAL NAVIGATION
Anesthesia: General

## 2020-04-12 MED ORDER — FENTANYL CITRATE (PF) 100 MCG/2ML IJ SOLN
INTRAMUSCULAR | Status: AC
  Filled 2020-04-12: qty 2

## 2020-04-12 MED ORDER — SUGAMMADEX SODIUM 200 MG/2ML IV SOLN
INTRAVENOUS | Status: DC | PRN
  Administered 2020-04-12: 200 mg via INTRAVENOUS

## 2020-04-12 MED ORDER — MELATONIN 5 MG PO TABS
5.0000 mg | ORAL_TABLET | Freq: Every day | ORAL | Status: DC
  Administered 2020-04-12: 5 mg via ORAL
  Filled 2020-04-12: qty 1

## 2020-04-12 MED ORDER — FENTANYL CITRATE (PF) 100 MCG/2ML IJ SOLN
50.0000 ug | Freq: Once | INTRAMUSCULAR | Status: AC
  Administered 2020-04-12 (×2): 25 ug via INTRAVENOUS

## 2020-04-12 MED ORDER — LACTATED RINGERS IV SOLN: INTRAVENOUS | Status: DC | PRN

## 2020-04-12 MED ORDER — UMECLIDINIUM BROMIDE 62.5 MCG/INH IN AEPB
1.0000 | INHALATION_SPRAY | Freq: Every day | RESPIRATORY_TRACT | Status: DC
  Filled 2020-04-12: qty 7

## 2020-04-12 MED ORDER — MIDAZOLAM HCL 2 MG/2ML IJ SOLN
2.0000 mg | Freq: Once | INTRAMUSCULAR | Status: AC
  Administered 2020-04-12: 2 mg via INTRAVENOUS

## 2020-04-12 MED ORDER — NITROGLYCERIN 0.4 MG SL SUBL: 0.4000 mg | SUBLINGUAL_TABLET | SUBLINGUAL | Status: DC | PRN

## 2020-04-12 MED ORDER — HYDROCODONE-ACETAMINOPHEN 5-325 MG PO TABS
ORAL_TABLET | ORAL | Status: AC
  Filled 2020-04-12: qty 2

## 2020-04-12 MED ORDER — OXYCODONE HCL 5 MG PO TABS: 5.0000 mg | ORAL_TABLET | Freq: Once | ORAL | Status: DC | PRN

## 2020-04-12 MED ORDER — CHLORHEXIDINE GLUCONATE 0.12 % MT SOLN: 15.0000 mL | Freq: Once | OROMUCOSAL | Status: AC

## 2020-04-12 MED ORDER — IPRATROPIUM-ALBUTEROL 20-100 MCG/ACT IN AERS: 1.0000 | INHALATION_SPRAY | Freq: Four times a day (QID) | RESPIRATORY_TRACT | Status: DC | PRN

## 2020-04-12 MED ORDER — LIDOCAINE 2% (20 MG/ML) 5 ML SYRINGE
INTRAMUSCULAR | Status: DC | PRN
  Administered 2020-04-12: 40 mg via INTRAVENOUS

## 2020-04-12 MED ORDER — FENTANYL CITRATE (PF) 100 MCG/2ML IJ SOLN
25.0000 ug | Freq: Once | INTRAMUSCULAR | Status: AC
  Administered 2020-04-12: 25 ug via INTRAVENOUS

## 2020-04-12 MED ORDER — PROPOFOL 10 MG/ML IV BOLUS
INTRAVENOUS | Status: DC | PRN
  Administered 2020-04-12: 170 mg via INTRAVENOUS

## 2020-04-12 MED ORDER — HYDROCODONE-ACETAMINOPHEN 5-325 MG PO TABS
1.0000 | ORAL_TABLET | ORAL | Status: DC | PRN
  Administered 2020-04-12 – 2020-04-13 (×4): 2 via ORAL
  Filled 2020-04-12 (×3): qty 2

## 2020-04-12 MED ORDER — FENTANYL CITRATE (PF) 250 MCG/5ML IJ SOLN
INTRAMUSCULAR | Status: DC | PRN
Start: 2020-04-12 — End: 2020-04-12
  Administered 2020-04-12: 50 ug via INTRAVENOUS

## 2020-04-12 MED ORDER — PHENYLEPHRINE HCL-NACL 10-0.9 MG/250ML-% IV SOLN
INTRAVENOUS | Status: DC | PRN
  Administered 2020-04-12: 25 ug/min via INTRAVENOUS

## 2020-04-12 MED ORDER — PANTOPRAZOLE SODIUM 40 MG PO TBEC
40.0000 mg | DELAYED_RELEASE_TABLET | Freq: Every day | ORAL | Status: DC
  Administered 2020-04-12 – 2020-04-13 (×2): 40 mg via ORAL
  Filled 2020-04-12 (×2): qty 1

## 2020-04-12 MED ORDER — ALBUTEROL SULFATE (2.5 MG/3ML) 0.083% IN NEBU: 2.5000 mg | INHALATION_SOLUTION | Freq: Four times a day (QID) | RESPIRATORY_TRACT | Status: DC | PRN

## 2020-04-12 MED ORDER — LACTATED RINGERS IV SOLN: INTRAVENOUS | Status: DC

## 2020-04-12 MED ORDER — FENTANYL CITRATE (PF) 100 MCG/2ML IJ SOLN: 25.0000 ug | INTRAMUSCULAR | Status: DC | PRN

## 2020-04-12 MED ORDER — ACETAMINOPHEN 160 MG/5ML PO SOLN: 325.0000 mg | ORAL | Status: DC | PRN

## 2020-04-12 MED ORDER — ONDANSETRON HCL 4 MG/2ML IJ SOLN: 4.0000 mg | Freq: Once | INTRAMUSCULAR | Status: DC | PRN

## 2020-04-12 MED ORDER — OXYCODONE HCL 5 MG/5ML PO SOLN: 5.0000 mg | Freq: Once | ORAL | Status: DC | PRN

## 2020-04-12 MED ORDER — IPRATROPIUM-ALBUTEROL 0.5-2.5 (3) MG/3ML IN SOLN: 3.0000 mL | Freq: Four times a day (QID) | RESPIRATORY_TRACT | Status: DC | PRN

## 2020-04-12 MED ORDER — ROSUVASTATIN CALCIUM 20 MG PO TABS
20.0000 mg | ORAL_TABLET | Freq: Every day | ORAL | Status: DC
  Administered 2020-04-12: 20 mg via ORAL
  Filled 2020-04-12: qty 1

## 2020-04-12 MED ORDER — MIDAZOLAM HCL 2 MG/2ML IJ SOLN
INTRAMUSCULAR | Status: AC
  Filled 2020-04-12: qty 2

## 2020-04-12 MED ORDER — PREDNISONE 5 MG PO TABS
5.0000 mg | ORAL_TABLET | Freq: Every day | ORAL | Status: DC
  Administered 2020-04-13: 5 mg via ORAL
  Filled 2020-04-12: qty 1

## 2020-04-12 MED ORDER — MOMETASONE FURO-FORMOTEROL FUM 100-5 MCG/ACT IN AERO
2.0000 | INHALATION_SPRAY | Freq: Two times a day (BID) | RESPIRATORY_TRACT | Status: DC
  Filled 2020-04-12: qty 8.8

## 2020-04-12 MED ORDER — ACETAMINOPHEN 325 MG PO TABS: 325.0000 mg | ORAL_TABLET | ORAL | Status: DC | PRN

## 2020-04-12 MED ORDER — DEXAMETHASONE SODIUM PHOSPHATE 10 MG/ML IJ SOLN
INTRAMUSCULAR | Status: DC | PRN
  Administered 2020-04-12: 5 mg via INTRAVENOUS

## 2020-04-12 MED ORDER — BUDESON-GLYCOPYRROL-FORMOTEROL 160-9-4.8 MCG/ACT IN AERO: 2.0000 | INHALATION_SPRAY | Freq: Two times a day (BID) | RESPIRATORY_TRACT | Status: DC

## 2020-04-12 MED ORDER — ONDANSETRON HCL 4 MG/2ML IJ SOLN
INTRAMUSCULAR | Status: DC | PRN
  Administered 2020-04-12: 4 mg via INTRAVENOUS

## 2020-04-12 MED ORDER — CLOPIDOGREL BISULFATE 75 MG PO TABS
75.0000 mg | ORAL_TABLET | Freq: Every day | ORAL | 3 refills | Status: DC
Start: 2020-04-12 — End: 2020-10-27

## 2020-04-12 MED ORDER — VITAMIN D 25 MCG (1000 UNIT) PO TABS
1000.0000 [IU] | ORAL_TABLET | ORAL | Status: DC
  Administered 2020-04-13: 1000 [IU] via ORAL
  Filled 2020-04-12: qty 1

## 2020-04-12 MED ORDER — ROCURONIUM BROMIDE 10 MG/ML (PF) SYRINGE
PREFILLED_SYRINGE | INTRAVENOUS | Status: DC | PRN
  Administered 2020-04-12: 60 mg via INTRAVENOUS

## 2020-04-12 MED ORDER — FENTANYL CITRATE (PF) 100 MCG/2ML IJ SOLN
50.0000 ug | Freq: Once | INTRAMUSCULAR | Status: AC
  Administered 2020-04-12: 50 ug via INTRAVENOUS

## 2020-04-12 MED ORDER — CHLORHEXIDINE GLUCONATE 0.12 % MT SOLN
OROMUCOSAL | Status: AC
  Administered 2020-04-12: 15 mL via OROMUCOSAL
  Filled 2020-04-12: qty 15

## 2020-04-12 NOTE — Anesthesia Preprocedure Evaluation (Addendum)
Anesthesia Evaluation  Patient identified by MRN, date of birth, ID band Patient awake    Reviewed: Patient's Chart, lab work & pertinent test results  Airway Mallampati: II       Dental  (+) Teeth Intact, Dental Advisory Given   Pulmonary COPD, former smoker,    Pulmonary exam normal        Cardiovascular hypertension, Pt. on medications + CAD   Rhythm:Regular Rate:Normal  2019 with stenting   Neuro/Psych negative neurological ROS  negative psych ROS   GI/Hepatic Neg liver ROS, GERD  Medicated and Controlled,  Endo/Other  negative endocrine ROS  Renal/GU Renal diseaseRenal cysts  negative genitourinary   Musculoskeletal  (+) Arthritis , Osteoarthritis,    Abdominal Normal abdominal exam  (+)  Abdomen: soft. Bowel sounds: normal.  Peds  Hematology negative hematology ROS (+)   Anesthesia Other Findings   Reproductive/Obstetrics negative OB ROS                            Anesthesia Physical Anesthesia Plan  ASA: III  Anesthesia Plan: General   Post-op Pain Management:    Induction:   PONV Risk Score and Plan: 2 and Ondansetron and Dexamethasone  Airway Management Planned: Mask, LMA and Oral ETT  Additional Equipment: None  Intra-op Plan:   Post-operative Plan: Extubation in OR  Informed Consent: I have reviewed the patients History and Physical, chart, labs and discussed the procedure including the risks, benefits and alternatives for the proposed anesthesia with the patient or authorized representative who has indicated his/her understanding and acceptance.       Plan Discussed with: CRNA and Anesthesiologist  Anesthesia Plan Comments: (Lab Results      Component                Value               Date                      WBC                      7.8                 04/12/2020                HGB                      14.7                04/12/2020                 HCT                      46.3                04/12/2020                MCV                      93.7                04/12/2020                PLT                      253  04/12/2020           Covid-19 Nucleic Acid Test Results Lab Results      Component                Value               Date                      SARSCOV2NAA              NEGATIVE            04/09/2020                [place]              NEGATIVE            10/10/2019           Cath 2019:  Prox RCA lesion is 25% stenosed.  1st RPLB lesion is 50% stenosed. THis is a very small vessel.  Mid LAD lesion is 25% stenosed.  1st Mrg lesion is 90% stenosed.  A drug-eluting stent was successfully placed using a STENT SYNERGY DES 2.5X24.  Post intervention, there is a 0% residual stenosis.  Ost 1st Mrg lesion is 70% stenosed.  A drug-eluting stent was successfully placed using a STENT SYNERGY DES 2.75X8.  Post intervention, there is a 0% residual stenosis.  The left ventricular systolic function is normal.  LV end diastolic pressure is normal.  The left ventricular ejection fraction is 55-65% by visual estimate.  There is no aortic valve stenosis.   )        Anesthesia Quick Evaluation

## 2020-04-12 NOTE — H&P (Signed)
NAME:  [name] Storlie., MRN:  [id number], DOB:  1941-06-16, LOS: 0 ADMISSION DATE:  04/12/2020,  CHIEF COMPLAINT: Iatrogenic pneumothorax  Brief History     History of present illness   79 year old man with severe COPD, emphysema with associated hypoxemia and pulmonary nodular disease.  He is undergone bronchoscopy to evaluate right lower lobe opacity, new left lower lobe opacity.  The initial cytology was suggestive of granulomatous inflammation in the right lower lobe.  The left lower lobe was nondiagnostic.  We followed serial scans and there is been some increase in size and a new left upper lobe area of opacity.  We decided to repeat his navigational bronchoscopy to pursue tissue.  He tolerated the procedure well but had a right-sided postprocedural iatrogenic pneumothorax.  A small bore pigtail chest tube was placed and is on -20 cm water suction.  He will be admitted for management of his pneumothorax  Past Medical History   Past Medical History:  Diagnosis Date  . Alcohol abuse   . Arthritis   . BPH with elevated PSA   . 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 COPD (chronic obstructive pulmonary disease) ([place])   . Coronary artery disease    2019 with stents  . Dyspnea   . ED (erectile dysfunction)   . Elevated PSA   . GERD (gastroesophageal reflux disease)   . Hyperlipidemia   . Hypertension   . Kidney cysts   . Palpitations   . Pneumonia      Significant Hospital Events   Bronchoscopy 9/14  Consults:    Procedures:  Bronchoscopy 9/14 Right pigtail chest tube 9/14 >>   Significant Diagnostic Tests:  Right lower lobe nodule cytology >>  Left lower lobe nodule cytology >>   Micro Data:  *None  Antimicrobials:  None  Interim history/subjective:  Having some pleuritic right-sided chest pain  Objective   Blood pressure 131/62, pulse 73, temperature 97.8 F (36.6 C), resp. rate 13, height 5' 10.5" (1.791 m), weight 68 kg, SpO2 99 %.          Intake/Output Summary (Last 24 hours) at 04/12/2020 1328 Last data filed at 04/12/2020 1100 Gross per 24 hour  Intake 700 ml  Output 550 ml  Net 150 ml   Filed Weights   04/12/20 0608  Weight: 68 kg    Examination: General: Thin elderly man, no distress on oxygen HENT: Oropharynx clear, pupils equal, no stridor Lungs: Distant, clear bilaterally.  Air movement heard bilaterally.  Chest tube in place and to suction, no air leak noted Cardiovascular: Regular, no murmur Abdomen: Nondistended, positive bowel sounds Extremities: No edema Neuro: Awake, alert, appropriate, follows commands and moves all extremities  Resolved Hospital Problem list     Assessment & Plan:   Iatrogenic right pneumothorax post bronchoscopy.  Right pigtail catheter placed -Continue -20 cmH2O suction overnight tonight with chest x-ray 9/15. -If no pneumothorax present on 9/15 then will place to waterseal and assess for resolution. -Hold Plavix post procedure until assessed on 9/15.  Can probably restart at that time.  Bilateral pulmonary nodular disease, post bronchoscopy -Preliminary information from some of the left-sided biopsies suggested granulomatous disease which would be consistent with information obtained on his prior bronchoscopy on the right.  Await final cytology and pathology  COPD with chronic hypoxic respiratory failure -On chronic prednisone 5 mg daily -Continue Breztri 2 puffs twice daily -Combivent as needed for breakthrough symptoms  GERD -Replaced his  home Nexium with pantoprazole  Hyperglycemia -Rosuvastatin    Best practice:  Diet: Regular Pain/Anxiety/Delirium protocol (if indicated): Norco as needed VAP protocol (if indicated): N/A DVT prophylaxis: N/A GI prophylaxis: PPI Glucose control: N/A Mobility: Out of bed with assistance Code Status: Full Family Communication: Discussed with the patient's wife 9/14 Disposition: Med-surg  Labs   CBC: Recent Labs   Lab 04/12/20 0607  WBC 7.8  HGB 14.7  HCT 46.3  MCV 93.7  PLT 431    Basic Metabolic Panel: Recent Labs  Lab 04/12/20 0607  NA 140  K 3.7  CL 102  CO2 30  GLUCOSE 95  BUN 14  CREATININE 1.18  CALCIUM 9.0   GFR: Estimated Creatinine Clearance: 48.8 mL/min (by C-G formula based on SCr of 1.18 mg/dL). Recent Labs  Lab 04/12/20 0607  WBC 7.8    Liver Function Tests: Recent Labs  Lab 04/12/20 0607  AST 18  ALT <5  ALKPHOS 58  BILITOT 0.5  PROT 5.5*  ALBUMIN 3.5   No results for input(s): LIPASE, AMYLASE in the last 168 hours. No results for input(s): AMMONIA in the last 168 hours.  ABG No results found for: PHART, PCO2ART, PO2ART, HCO3, TCO2, ACIDBASEDEF, O2SAT   Coagulation Profile: No results for input(s): INR, PROTIME in the last 168 hours.  Cardiac Enzymes: No results for input(s): CKTOTAL, CKMB, CKMBINDEX, TROPONINI in the last 168 hours.  HbA1C: No results found for: HGBA1C  CBG: No results for input(s): GLUCAP in the last 168 hours.  Review of Systems:   Pleuritic R CP   Past Medical History  He,  has a past medical history of Alcohol abuse, Arthritis, BPH with elevated PSA, Cancer ([place]), COPD (chronic obstructive pulmonary disease) ([place]), Coronary artery disease, Dyspnea, ED (erectile dysfunction), Elevated PSA, GERD (gastroesophageal reflux disease), Hyperlipidemia, Hypertension, Kidney cysts, Palpitations, and Pneumonia.   Surgical History    Past Surgical History:  Procedure Laterality Date  . BRONCHIAL BIOPSY  10/13/2019   Procedure: BRONCHIAL BIOPSIES;  Surgeon: Collene Gobble, MD;  Location[facility] ENDOSCOPY;  Service: Pulmonary;;  . BRONCHIAL BRUSHINGS  10/13/2019   Procedure: BRONCHIAL BRUSHINGS;  Surgeon: Collene Gobble, MD;  Location: [facility] ENDOSCOPY;  Service: Pulmonary;;  . BRONCHIAL NEEDLE ASPIRATION BIOPSY  10/13/2019   Procedure: BRONCHIAL NEEDLE ASPIRATION BIOPSIES;  Surgeon: Collene Gobble, MD;  Location: [place];  Service:  Pulmonary;;  . BRONCHIAL WASHINGS  10/13/2019   Procedure: BRONCHIAL WASHINGS;  Surgeon: Collene Gobble, MD;  Location: [place] ENDOSCOPY;  Service: Pulmonary;;  . CARDIAC CATHETERIZATION  2002   50% RCA  . CATARACT EXTRACTION, BILATERAL    . CORONARY STENT INTERVENTION N/A 04/15/2018   Procedure: CORONARY STENT INTERVENTION;  Surgeon: Jettie Booze, MD;  Location: [place] CV LAB;  Service: Cardiovascular;  Laterality: N/A;  om1  . EYE SURGERY    . KNEE ARTHROSCOPY    . LEFT HEART CATH AND CORONARY ANGIOGRAPHY N/A 04/15/2018   Procedure: LEFT HEART CATH AND CORONARY ANGIOGRAPHY;  Surgeon: Jettie Booze, MD;  Location: [place] CV LAB;  Service: Cardiovascular;  Laterality: N/A;  . MULTIPLE TOOTH EXTRACTIONS    . TONSILLECTOMY    . VIDEO BRONCHOSCOPY WITH ENDOBRONCHIAL NAVIGATION N/A 07/26/2016   Procedure: VIDEO BRONCHOSCOPY WITH ENDOBRONCHIAL NAVIGATION;  Surgeon: Collene Gobble, MD;  Location: [place];  Service: Thoracic;  Laterality: N/A;  . VIDEO BRONCHOSCOPY WITH ENDOBRONCHIAL NAVIGATION N/A 10/13/2019   Procedure: [facility] AND VIDEO BRONCHOSCOPY WITH ENDOBRONCHIAL NAVIGATION;  Surgeon: Baltazar Apo  S, MD;  Location: [place];  Service: Pulmonary;  Laterality: N/A;     Social History   reports that he quit smoking about 7 months ago. His smoking use included cigarettes. He has a 10.00 pack-year smoking history. He has never used smokeless tobacco. He reports that he does not drink alcohol and does not use drugs.   Family History   His family history includes Alzheimer's disease in his mother; Hypertension in his mother.   Allergies No Known Allergies   Home Medications  Prior to Admission medications   Medication Sig Start Date End Date Taking? Authorizing Provider  Budeson-Glycopyrrol-Formoterol (BREZTRI AEROSPHERE) 160-9-4.8 MCG/ACT AERO Inhale 2 puffs into the lungs 2 (two) times daily. 03/08/20  Yes Martyn Ehrich, NP  cholecalciferol (VITAMIN D3) 25 MCG  (1000 UNIT) tablet Take 1,000 Units by mouth 3 (three) times a week.    Yes [provider]  esomeprazole (NEXIUM) 20 MG capsule Take 20 mg by mouth daily.    Yes [provider]  Ipratropium-Albuterol (COMBIVENT RESPIMAT) 20-100 MCG/ACT AERS respimat Inhale 1-2 puffs into the lungs every 6 (six) hours as needed for wheezing or shortness of breath. 03/01/20  Yes Martyn Ehrich, NP  Melatonin 5 MG CAPS Take 5 mg by mouth at bedtime.   Yes [provider]  nitroGLYCERIN (NITROSTAT) 0.4 MG SL tablet Place 1 tablet (0.4 mg total) under the tongue every 5 (five) minutes as needed for chest pain. 11/12/19  Yes Revankar, Reita Cliche, MD  predniSONE (DELTASONE) 5 MG tablet Take 5 mg by mouth daily with breakfast.   Yes [provider]  rosuvastatin (CRESTOR) 20 MG tablet TAKE 1 TABLET BY MOUTH EVERYDAY AT BEDTIME Patient taking differently: Take 20 mg by mouth at bedtime.  02/25/20  Yes Revankar, Reita Cliche, MD  albuterol (PROVENTIL) (2.5 MG/3ML) 0.083% nebulizer solution Take 3 mLs (2.5 mg total) by nebulization every 6 (six) hours as needed for wheezing or shortness of breath. 01/05/19   Collene Gobble, MD  albuterol (VENTOLIN HFA) 108 (90 Base) MCG/ACT inhaler Inhale 2 puffs into the lungs every 6 (six) hours as needed for wheezing or shortness of breath. Patient not taking: Reported on 04/07/2020    [provider]  amoxicillin (AMOXIL) 500 MG capsule Take 500 mg by mouth 3 (three) times daily. Patient not taking: Reported on 03/17/2020    [provider]  chlorhexidine (PERIDEX) 0.12 % solution  11/04/19   [provider]  clopidogrel (PLAVIX) 75 MG tablet Take 1 tablet (75 mg total) by mouth daily. Ok to restart this medication on Wednesday 04/13/20. 04/12/20   Collene Gobble, MD  Fluticasone-Umeclidin-Vilant (TRELEGY ELLIPTA) 100-62.5-25 MCG/INH AEPB INHALE 1 PUFF BY MOUTH EVERY DAY Patient not taking: Reported on 03/17/2020 01/08/20   Collene Gobble, MD      Critical care time: NA    Baltazar Apo, MD, PhD 04/12/2020, 3:28 PM [facility] [phone] or if no answer [phone]

## 2020-04-12 NOTE — Op Note (Signed)
Video Bronchoscopy with Electromagnetic Navigation Procedure Note  Date of Operation: 04/12/2020  Pre-op Diagnosis: Pulmonary nodules  Post-op Diagnosis: Same  Surgeon: Baltazar Apo  Assistants: None  Anesthesia: General endotracheal anesthesia  Operation: Flexible video fiberoptic bronchoscopy with electromagnetic navigation and biopsies.  Estimated Blood Loss: Minimal  Complications: None apparent  Indications and History: [name] [name]. is a 79 y.o. male with history tobacco.  He has slowly enlarging bilateral pulmonary nodules on serial CT scans of the chest.  He has had nondiagnostic bronchoscopy in the past and returns now for repeat bronchoscopy to obtain tissue diagnosis.  The risks, benefits, complications, treatment options and expected outcomes were discussed with the patient.  The possibilities of pneumothorax, pneumonia, reaction to medication, pulmonary aspiration, perforation of a viscus, bleeding, failure to diagnose a condition and creating a complication requiring transfusion or operation were discussed with the patient who freely signed the consent.    Description of Procedure: The patient was seen in the Preoperative Area, was examined and was deemed appropriate to proceed.  The patient was taken to [facility] endoscopy room 2, identified as [name] [name]. and the procedure verified as Flexible Video Fiberoptic Bronchoscopy.  A Time Out was held and the above information confirmed.   Prior to the date of the procedure a high-resolution CT scan of the chest was performed. Utilizing [place] a virtual tracheobronchial tree was generated to allow the creation of distinct navigation pathways to the patient's parenchymal abnormalities. After being taken to the operating room general anesthesia was initiated and the patient  was orally intubated. The video fiberoptic bronchoscope was introduced via the endotracheal tube and a general inspection was  performed which showed normal airways throughout.  No endobronchial lesions.  Right mainstem bronchus endobronchial brushings were performed to facilitate Percepta testing. The extendable working channel and locator guide were introduced into the bronchoscope. The distinct navigation pathways prepared prior to this procedure were then utilized to navigate to within 0.3-0.6 cm of patient's lesions in the right lower lobe (target 1 and 2) and left lower lobe (target 3) identified on CT scan.  The left upper lobe nodule was not sampled (target 4).  The extendable working channel was secured into place and the locator guide was withdrawn. Under fluoroscopic guidance transbronchial brushings, transbronchial Wang needle biopsies, and transbronchial forceps biopsies were performed to be sent for cytology and pathology. A bronchioalveolar lavage was performed in the right upper lobe and right lower lobe and sent for cytology and microbiology (bacterial, fungal, AFB smears and cultures). At the end of the procedure a general airway inspection was performed and there was no evidence of active bleeding. The bronchoscope was removed.  The patient tolerated the procedure well. There was no significant blood loss and there were no obvious complications. A post-procedural chest x-ray is pending.  Samples: 1. Transbronchial brushings from superior segment right lower lobe, superior segment left lower lobe 2. Transbronchial Wang needle biopsies from superior segment right lower lobe, superior segment left lower lobe 3. Transbronchial forceps biopsies from superior segment right lower lobe, superior segment left lower lobe 4. Bronchoalveolar lavage from superior segment right lower lobe, superior segment left lower lobe 5. Endobronchial brushings from right mainstem bronchus for Percepta testing  Plans:  The patient will be discharged from the PACU to home when recovered from anesthesia and after chest x-ray is reviewed. We  will review the cytology, pathology and microbiology results with the patient when they become available. Outpatient followup  will be with Dr Lamonte Sakai.    Baltazar Apo, MD, PhD 04/12/2020, 9:28 AM  [facility] [phone] or if no answer [phone]

## 2020-04-12 NOTE — Anesthesia Procedure Notes (Signed)
Procedure Name: Intubation Date/Time: 04/12/2020 7:52 AM Performed by: Alain Marion, CRNA Pre-anesthesia Checklist: Patient identified, Emergency Drugs available, Suction available and Patient being monitored Patient Re-evaluated:Patient Re-evaluated prior to induction Oxygen Delivery Method: Circle System Utilized Preoxygenation: Pre-oxygenation with 100% oxygen Induction Type: IV induction Ventilation: Mask ventilation without difficulty Laryngoscope Size: Miller and 2 Grade View: Grade I Tube type: Oral Tube size: 8.5 mm Number of attempts: 1 Airway Equipment and Method: Stylet Placement Confirmation: ETT inserted through vocal cords under direct vision,  positive ETCO2 and breath sounds checked- equal and bilateral Secured at: 23 cm Tube secured with: Tape Dental Injury: Teeth and Oropharynx as per pre-operative assessment

## 2020-04-12 NOTE — Transfer of Care (Signed)
Immediate Anesthesia Transfer of Care Note  Patient: Shubham Thackston.  Procedure(s) Performed: VIDEO BRONCHOSCOPY WITH ENDOBRONCHIAL NAVIGATION (N/A ) BRONCHIAL BRUSHINGS BRONCHIAL WASHINGS BRONCHIAL BIOPSIES BRONCHIAL NEEDLE ASPIRATION BIOPSIES  Patient Location: PACU  Anesthesia Type:General  Level of Consciousness: awake, alert  and oriented  Airway & Oxygen Therapy: Patient Spontanous Breathing and Patient connected to nasal cannula oxygen  Post-op Assessment: Report given to RN and Post -op Vital signs reviewed and stable  Post vital signs: Reviewed and stable  Last Vitals:  Vitals Value Taken Time  BP 134/48 04/12/20 0928  Temp    Pulse 76 04/12/20 0932  Resp 38 04/12/20 0932  SpO2 99 % 04/12/20 0932  Vitals shown include unvalidated device data.  Last Pain: There were no vitals filed for this visit.       Complications: No complications documented.

## 2020-04-12 NOTE — Progress Notes (Signed)
Pt arrived from PACU to 4E room 25 after chest tube placement following post-bronchoscopy right pneumothorax.  Telemetry monitor applied and CCMD notified.  Pt oriented to unit and room to include call light and phone.  Will continue to monitor.

## 2020-04-12 NOTE — Discharge Instructions (Signed)
Flexible Bronchoscopy, Care After This sheet gives you information about how to care for yourself after your test. Your doctor may also give you more specific instructions. If you have problems or questions, contact your doctor. Follow these instructions at home: Eating and drinking  Do not eat or drink anything (not even water) for 2 hours after your test, or until your numbing medicine (local anesthetic) wears off.  When your numbness is gone and your cough and gag reflexes have come back, you may: ? Eat only soft foods. ? Slowly drink liquids.  The day after the test, go back to your normal diet. Driving  Do not drive for 24 hours if you were given a medicine to help you relax (sedative).  Do not drive or use heavy machinery while taking prescription pain medicine. General instructions   Take over-the-counter and prescription medicines only as told by your doctor.  Return to your normal activities as told. Ask what activities are safe for you.  Do not use any products that have nicotine or tobacco in them. This includes cigarettes and e-cigarettes. If you need help quitting, ask your doctor.  Keep all follow-up visits as told by your doctor. This is important. It is very important if you had a tissue sample (biopsy) taken. Get help right away if:  You have shortness of breath that gets worse.  You get light-headed.  You feel like you are going to pass out (faint).  You have chest pain.  You cough up: ? More than a little blood. ? More blood than before. Summary  Do not eat or drink anything (not even water) for 2 hours after your test, or until your numbing medicine wears off.  Do not use cigarettes. Do not use e-cigarettes.  Get help right away if you have chest pain.  Please call our office for any questions or concerns.  [phone].  This information is not intended to replace advice given to you by your health care provider. Make sure you discuss any  questions you have with your health care provider. Document Revised: 06/28/2017 Document Reviewed: 08/03/2016 Elsevier Patient Education  2020 [employer].

## 2020-04-12 NOTE — Interval H&P Note (Signed)
History and Physical Interval Note:  04/12/2020 7:44 AM  [name] [name].  has presented today for surgery, with the diagnosis of PULMONARY NODULES.  The various methods of treatment have been discussed with the patient and family. After consideration of risks, benefits and other options for treatment, the patient has consented to  Procedure(s): [place] (N/A) as a surgical intervention.  The patient's history has been reviewed, patient examined, no change in status, stable for surgery.  I have reviewed the patient's chart and labs.  Questions were answered to the patient's satisfaction.     Collene Gobble

## 2020-04-12 NOTE — Anesthesia Postprocedure Evaluation (Signed)
Anesthesia Post Note  Patient: Sinclair Alligood.  Procedure(s) Performed: VIDEO BRONCHOSCOPY WITH ENDOBRONCHIAL NAVIGATION (N/A ) BRONCHIAL BRUSHINGS BRONCHIAL WASHINGS BRONCHIAL BIOPSIES BRONCHIAL NEEDLE ASPIRATION BIOPSIES     Patient location during evaluation: PACU Anesthesia Type: General Level of consciousness: awake and alert Pain management: pain level controlled Vital Signs Assessment: post-procedure vital signs reviewed and stable Respiratory status: spontaneous breathing, nonlabored ventilation, respiratory function stable and patient connected to nasal cannula oxygen Cardiovascular status: blood pressure returned to baseline and stable Postop Assessment: no apparent nausea or vomiting Anesthetic complications: no Comments: Procedure complicated by proceduralist PTX necessitating pigtail placement. Patient hemodynamically stable with vitals WNL, evaluated multiple times in PACU without changes.    No complications documented.  Last Vitals:  Vitals:   04/12/20 1000 04/12/20 1100  BP: 136/69 137/76  Pulse: 84 77  Resp: (!) 21 20  Temp:    SpO2: 96% 94%    Last Pain:  Vitals:   04/12/20 1000  PainSc: 0-No pain                 Belenda Cruise P Ramiro Pangilinan

## 2020-04-13 ENCOUNTER — Encounter (HOSPITAL_COMMUNITY): Payer: Self-pay | Admitting: Emergency Medicine

## 2020-04-13 ENCOUNTER — Inpatient Hospital Stay (HOSPITAL_COMMUNITY): Payer: Medicare Other

## 2020-04-13 DIAGNOSIS — J939 Pneumothorax, unspecified: Secondary | ICD-10-CM

## 2020-04-13 DIAGNOSIS — J441 Chronic obstructive pulmonary disease with (acute) exacerbation: Secondary | ICD-10-CM

## 2020-04-13 LAB — SURGICAL PATHOLOGY

## 2020-04-13 LAB — FUNGUS STAIN

## 2020-04-13 LAB — ACID FAST SMEAR (AFB, MYCOBACTERIA)
Acid Fast Smear: NEGATIVE
Acid Fast Smear: NEGATIVE

## 2020-04-13 MED ORDER — UMECLIDINIUM BROMIDE 62.5 MCG/INH IN AEPB
1.0000 | INHALATION_SPRAY | Freq: Every day | RESPIRATORY_TRACT | Status: DC
  Administered 2020-04-13: 08:00:00 1 via RESPIRATORY_TRACT

## 2020-04-13 MED ORDER — MOMETASONE FURO-FORMOTEROL FUM 100-5 MCG/ACT IN AERO
2.0000 | INHALATION_SPRAY | Freq: Two times a day (BID) | RESPIRATORY_TRACT | Status: DC
  Administered 2020-04-13: 2 via RESPIRATORY_TRACT

## 2020-04-13 MED ORDER — INFLUENZA VAC A&B SA ADJ QUAD 0.5 ML IM PRSY
0.5000 mL | PREFILLED_SYRINGE | INTRAMUSCULAR | Status: AC
  Administered 2020-04-13: 0.5 mL via INTRAMUSCULAR
  Filled 2020-04-13: qty 0.5

## 2020-04-13 NOTE — Discharge Summary (Signed)
Physician Discharge Summary         Patient ID: [name] [name]. MRN: [id number] DOB/AGE: [birth year] 79 y.o.  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  He has undergone previous bronchoscopy to evaluate right lower lobe opacities also having new left lower lobe opacities.  The initial cytology was suggestive of granulomatous disease in the right lower lobe the left lower lobe was nondiagnostic.  He had undergone serial CT imaging with increased size in the left upper lobe opacity and because of this it was decided to proceed again with repeat flexible video fiberoptic bronchoscopy with electromagnetic navigation to assist with biopsies.  He was brought to operating room at [facility] on 9/14 by Dr. Lamonte Sakai.  Samples were obtained from the following:  1. Transbronchial brushings from superior segment right lower lobe, superior segment left lower lobe 2. Transbronchial Wang needle biopsies from superior segment right lower lobe, superior segment left lower lobe 3. Transbronchial forceps biopsies from superior segment right lower lobe, superior segment left lower lobe 4. Bronchoalveolar lavage from superior segment right lower lobe, superior segment left lower lobe 5. Endobronchial brushings from right mainstem bronchus for Percepta testing  He was brought to the postanesthesia recovery unit post bronchoscopy, and post procedural chest x-ray imaging demonstrated right-sided postprocedural iatric pneumothorax, because of this a pigtail catheter was placed in the recovery room and placed in waterseal. He was supported overnight on 20 cm water suction, chest tube demonstrated no air leak on morning  rounds, chest x-ray was negative for pneumothorax post chest tube placement.  Following this the patient was placed on waterseal throughout the morning and into the early afternoon.  Follow-up chest x-ray was again obtained demonstrating resolution of pneumothorax and so the chest tube was removed.  He is now deemed ready for discharge with follow-up in the pulmonary office.  Discharge Plan by Active Problems   Resolved iatrogenic right pneumothorax.  Chest tube has since been removed on 9/15 Plan Okay to discharge to home Keep current dressing in place for 24 hours, then may shower and resume typical activities with the exception of heavy lifting or heavy exertion for 2 weeks He has follow-up in our office on 9/23 He has been instructed to call our office should he develop sudden worsening of shortness of breath, pleuritic chest discomfort, or pain radiating up the shoulder blade.  He is also been instructed to report to the emergency room should these symptoms occur  Bilateral pulmonary nodules Consistent with granulomatous disease Plan Awaiting final cytology and pathology Outpatient therapy to be determined  COPD with chronic hypoxic respiratory failure Plan Continue prednisone 5 mg daily Continue Bretztri  Continue Combivent as needed  GERD Plan Nexium Significant Hospital tests/ studies   Procedures   Right chest tube, placed 9/14, removed 9/15 Culture data/antimicrobials      Consults      Discharge Exam: Blood Pressure (Abnormal) 151/75 (BP Location: Right Arm)   Pulse 81   Temperature 98.4 F (36.9 C) (Oral)   Respiration 17   Height 5' 10.5" (1.791 m)   Weight 68 kg   Oxygen Saturation 93%   Body Mass Index 21.22 kg/m  General: Pleasant 79 year old white male resting in bed HEENT normocephalic  atraumatic no jugular venous distention Pulmonary: Clear to auscultation no accessory use Chest tube, negative air leak Cardiac: Regular rate and rhythm Abdomen:  Soft nontender Extremities: Warm dry brisk cap refill Neuro: Awake oriented no focal deficit. Labs at discharge   Lab Results  Component Value Date   CREATININE 1.18 04/12/2020   BUN 14 04/12/2020   NA 140 04/12/2020   K 3.7 04/12/2020   CL 102 04/12/2020   CO2 30 04/12/2020   Lab Results  Component Value Date   WBC 7.8 04/12/2020   HGB 14.7 04/12/2020   HCT 46.3 04/12/2020   MCV 93.7 04/12/2020   PLT 253 04/12/2020   Lab Results  Component Value Date   ALT <5 04/12/2020   AST 18 04/12/2020   ALKPHOS 58 04/12/2020   BILITOT 0.5 04/12/2020   Lab Results  Component Value Date   INR 1.1 (H) 04/05/2020   INR 1.0 10/13/2019   INR 1.01 07/19/2016    Current radiological studies    DG Chest 1 View  Result Date: 04/12/2020 CLINICAL DATA:  Pneumothorax post bronchoscopy.  Chest tube EXAM: CHEST  1 VIEW COMPARISON:  04/12/2020 FINDINGS: Interval placement of pigtail chest tube on the right in good position. Right pneumothorax has resolved. Mild bibasilar airspace disease. Negative for heart failure or effusion. IMPRESSION: Satisfactory right chest tube placement with complete resolution of right pneumothorax Mild bibasilar airspace disease. Electronically Signed   By: Franchot Gallo M.D.   On: 04/12/2020 12:57   DG Chest Port 1 View  Result Date: 04/13/2020 CLINICAL DATA:  Follow-up right-sided pneumothorax EXAM: PORTABLE CHEST 1 VIEW COMPARISON:  04/12/2020 FINDINGS: Cardiac shadow is stable. Pigtail catheter is again noted on the right. No pneumothorax is seen. The lungs are hyperinflated. No focal infiltrate or sizable effusion is seen. No acute bony abnormality is noted. IMPRESSION: Pigtail catheter in place on the right. No recurrent pneumothorax is seen. Electronically Signed   By: Inez Catalina M.D.   On: 04/13/2020 06:45   DG Chest Port 1 View  Result Date: 04/12/2020 CLINICAL DATA:  Bronchoscopy and biopsy. EXAM: PORTABLE CHEST 1 VIEW COMPARISON:  10/13/2019 FINDINGS:  Sizable right pneumothorax, likely 40- 50%. No visible alveolar hemorrhage. No mediastinal shift or diaphragm depression when allowing for chronic hyperinflation. Emphysema. Normal heart size. Critical Value/emergent results were called by telephone at the time of interpretation on 04/12/2020 at 10:34 am to [place] , who reports that Dr Malvin Johns is aware and already treating the patient. IMPRESSION: Moderate to large right pneumothorax without mediastinal shift. Emphysema. Electronically Signed   By: Monte Fantasia M.D.   On: 04/12/2020 10:34   DG C-ARM BRONCHOSCOPY  Result Date: 04/12/2020 C-ARM BRONCHOSCOPY: Fluoroscopy was utilized by the requesting physician.  No radiographic interpretation.    Disposition:    Discharge disposition: 01-Home or Self Care       Discharge Instructions    Diet - low sodium heart healthy   Complete by: As directed    Diet - low sodium heart healthy   Complete by: As directed    Diet general   Complete by: As directed    Discharge patient   Complete by: As directed    After CXR has been evaluated/   Discharge disposition: 01-Home or Self Care   Discharge patient date: 04/12/2020   Increase activity slowly   Complete by: As directed    Increase activity slowly   Complete by: As directed    No lifting over 8 to 10  pounds in the next 2 weeks Okay to shower effective 9/16 No heavy straining Call for fever, chills, worsening chest pain, worsening shortness of breath      Allergies as of 04/13/2020   No Known Allergies     Medication List    Stop taking these medications   amoxicillin 500 MG capsule Commonly known as: AMOXIL   chlorhexidine 0.12 % solution Commonly known as: PERIDEX   Trelegy Ellipta 100-62.5-25 MCG/INH Aepb Generic drug: Fluticasone-Umeclidin-Vilant     Take these medications   albuterol (2.5 MG/3ML) 0.083% nebulizer solution Commonly known as: PROVENTIL Take 3 mLs (2.5 mg total) by nebulization every 6 (six) hours  as needed for wheezing or shortness of breath. What changed: Another medication with the same name was removed. Continue taking this medication, and follow the directions you see here.   Breztri Aerosphere 160-9-4.8 MCG/ACT Aero Generic drug: Budeson-Glycopyrrol-Formoterol Inhale 2 puffs into the lungs 2 (two) times daily.   cholecalciferol 25 MCG (1000 UNIT) tablet Commonly known as: VITAMIN D3 Take 1,000 Units by mouth 3 (three) times a week.   clopidogrel 75 MG tablet Commonly known as: PLAVIX Take 1 tablet (75 mg total) by mouth daily. Ok to restart this medication on Wednesday 04/13/20. What changed: additional instructions   Combivent Respimat 20-100 MCG/ACT Aers respimat Generic drug: Ipratropium-Albuterol Inhale 1-2 puffs into the lungs every 6 (six) hours as needed for wheezing or shortness of breath.   esomeprazole 20 MG capsule Commonly known as: NEXIUM Take 20 mg by mouth daily.   Melatonin 5 MG Caps Take 5 mg by mouth at bedtime.   nitroGLYCERIN 0.4 MG SL tablet Commonly known as: NITROSTAT Place 1 tablet (0.4 mg total) under the tongue every 5 (five) minutes as needed for chest pain.   predniSONE 5 MG tablet Commonly known as: DELTASONE Take 5 mg by mouth daily with breakfast.   rosuvastatin 20 MG tablet Commonly known as: CRESTOR TAKE 1 TABLET BY MOUTH EVERYDAY AT BEDTIME What changed: See the new instructions.        Follow-up appointment   Made for 9/23 at 1130 am  Discharge Condition:    good  Physician Statement:   The Patient was personally examined, the discharge assessment and plan has been personally reviewed and I agree with ACNP Timur Nibert's assessment and plan. 32 minutes of time have been dedicated to discharge assessment, planning and discharge instructions.   Signed: Clementeen Graham 04/13/2020, 3:58 PM

## 2020-04-13 NOTE — Progress Notes (Signed)
Order received to discharge patient. Telemetry monitor removed and CCMD notified.  Discharge instructions, follow up, medications and instructions for their use discussed with patient and patient's wife.

## 2020-04-13 NOTE — H&P (Signed)
NAME:  [name] Weed., MRN:  [id number], DOB:  June 10, 1941, LOS: 1 ADMISSION DATE:  04/12/2020,  CHIEF COMPLAINT: Iatrogenic pneumothorax  Brief History   79 year old man with post procedural pneumothorax.   History of present illness   78 year old man with severe COPD, emphysema with associated hypoxemia and pulmonary nodular disease.  He is undergone bronchoscopy to evaluate right lower lobe opacity, new left lower lobe opacity.  The initial cytology was suggestive of granulomatous inflammation in the right lower lobe.  The left lower lobe was nondiagnostic.  We followed serial scans and there is been some increase in size and a new left upper lobe area of opacity.  We decided to repeat his navigational bronchoscopy to pursue tissue.  He tolerated the procedure well but had a right-sided postprocedural iatrogenic pneumothorax.  A small bore pigtail chest tube was placed and is on -20 cm water suction.  He will be admitted for management of his pneumothorax  Past Medical History   Past Medical History:  Diagnosis Date  . Alcohol abuse   . Arthritis   . BPH with elevated PSA   . 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 COPD (chronic obstructive pulmonary disease) ([place])   . Coronary artery disease    2019 with stents  . Dyspnea   . ED (erectile dysfunction)   . Elevated PSA   . GERD (gastroesophageal reflux disease)   . Hyperlipidemia   . Hypertension   . Kidney cysts   . Palpitations   . Pneumonia     Significant Hospital Events   Bronchoscopy 9/14  Consults:    Procedures:  Bronchoscopy 9/14 Right pigtail chest tube 9/14 >>   Significant Diagnostic Tests:  Right lower lobe nodule cytology >>  Left lower lobe nodule cytology >>  CXR 9/15 >> pigtail in good position, no pneumothorax.   Micro Data:  *None  Antimicrobials:  None  Interim history/subjective:  No voiced complaints.  Objective   Blood pressure (!) 120/56, pulse 85, temperature  (!) 97.3 F (36.3 C), temperature source Oral, resp. rate 18, height 5' 10.5" (1.791 m), weight 68 kg, SpO2 97 %.        Intake/Output Summary (Last 24 hours) at 04/13/2020 1111 Last data filed at 04/13/2020 0900 Gross per 24 hour  Intake 900 ml  Output 650 ml  Net 250 ml   Filed Weights   04/12/20 0608  Weight: 68 kg    Examination: General: Thin elderly man, no distress on oxygen HENT: Oropharynx clear, pupils equal, no stridor Lungs: Distant, clear bilaterally.  Air movement heard bilaterally.  Chest tube in place and to suction, no air leak noted Cardiovascular: Regular, no murmur Abdomen: Nondistended, positive bowel sounds Extremities: No edema Neuro: Awake, alert, appropriate, follows commands and moves all extremities  Resolved Hospital Problem list     Assessment & Plan:   Iatrogenic right pneumothorax post bronchoscopy.  Right pigtail catheter placed -Chest tube to water seal.  -Hold Plavix post procedure until assessed on 9/15.  Can probably restart at that time.  Bilateral pulmonary nodular disease, post bronchoscopy -Preliminary information from some of the left-sided biopsies suggested granulomatous disease which would be consistent with information obtained on his prior bronchoscopy on the right.  Await final cytology and pathology  COPD with chronic hypoxic respiratory failure -On chronic prednisone 5 mg daily -Continue Breztri 2 puffs twice daily -Combivent as needed for breakthrough symptoms  GERD -Replaced his home  Nexium with pantoprazole  Hyperglycemia -Rosuvastatin  Best practice:  Diet: Regular Pain/Anxiety/Delirium protocol (if indicated): Norco as needed VAP protocol (if indicated): N/A DVT prophylaxis: N/A GI prophylaxis: PPI Glucose control: N/A Mobility: Out of bed with assistance Code Status: Full Family Communication: Discussed with the patient's wife 9/14 Disposition: Med-surg  Labs   CBC: Recent Labs  Lab 04/12/20 0607    WBC 7.8  HGB 14.7  HCT 46.3  MCV 93.7  PLT 747    Basic Metabolic Panel: Recent Labs  Lab 04/12/20 0607  NA 140  K 3.7  CL 102  CO2 30  GLUCOSE 95  BUN 14  CREATININE 1.18  CALCIUM 9.0   GFR: Estimated Creatinine Clearance: 48.8 mL/min (by C-G formula based on SCr of 1.18 mg/dL). Recent Labs  Lab 04/12/20 0607  WBC 7.8    Liver Function Tests: Recent Labs  Lab 04/12/20 0607  AST 18  ALT <5  ALKPHOS 58  BILITOT 0.5  PROT 5.5*  ALBUMIN 3.5   No results for input(s): LIPASE, AMYLASE in the last 168 hours. No results for input(s): AMMONIA in the last 168 hours.  ABG No results found for: PHART, PCO2ART, PO2ART, HCO3, TCO2, ACIDBASEDEF, O2SAT   Coagulation Profile: No results for input(s): INR, PROTIME in the last 168 hours.  Cardiac Enzymes: No results for input(s): CKTOTAL, CKMB, CKMBINDEX, TROPONINI in the last 168 hours.  HbA1C: No results found for: HGBA1C  CBG: No results for input(s): GLUCAP in the last 168 hours.  Kipp Brood, MD [facility] ICU Physician [place]  Pager: [phone] Mobile: [phone] After hours: [phone].  04/13/2020, 11:19 AM

## 2020-04-14 ENCOUNTER — Encounter (HOSPITAL_COMMUNITY): Payer: Self-pay | Admitting: Emergency Medicine

## 2020-04-14 LAB — CULTURE, RESPIRATORY W GRAM STAIN
Culture: NO GROWTH
Culture: NO GROWTH

## 2020-04-14 LAB — CYTOLOGY - NON PAP

## 2020-04-15 ENCOUNTER — Telehealth: Payer: Self-pay | Admitting: Emergency Medicine

## 2020-04-15 NOTE — Telephone Encounter (Signed)
Called patient to discuss FOB results. No malignant cells seen in LLL and RLL biopsies or brushings. All cx's are pending.  No answer,left message with these details. Will try him again. If he calls back, ok to report this info to him.

## 2020-04-19 NOTE — Telephone Encounter (Signed)
Spoke with him and reviewed cytology, pathology. Cultures are all still pending.   He informed me that he had desaturation even while sleeping on RA the other night >> will probably need ONO to get a home concentrator. He has follow up this week w a CXR to insure no PTX.

## 2020-04-20 ENCOUNTER — Other Ambulatory Visit: Payer: Self-pay

## 2020-04-20 DIAGNOSIS — R911 Solitary pulmonary nodule: Secondary | ICD-10-CM

## 2020-04-20 DIAGNOSIS — J441 Chronic obstructive pulmonary disease with (acute) exacerbation: Secondary | ICD-10-CM

## 2020-04-21 ENCOUNTER — Telehealth: Payer: Self-pay | Admitting: Primary Care

## 2020-04-21 ENCOUNTER — Ambulatory Visit: Payer: Medicare Other | Admitting: Primary Care

## 2020-04-21 ENCOUNTER — Other Ambulatory Visit: Payer: Self-pay

## 2020-04-21 ENCOUNTER — Encounter: Payer: Self-pay | Admitting: Primary Care

## 2020-04-21 ENCOUNTER — Ambulatory Visit (INDEPENDENT_AMBULATORY_CARE_PROVIDER_SITE_OTHER): Payer: Medicare Other

## 2020-04-21 VITALS — BP 120/68 | HR 89 | Temp 98.0°F | Ht 70.0 in | Wt 148.8 lb

## 2020-04-21 DIAGNOSIS — R911 Solitary pulmonary nodule: Secondary | ICD-10-CM

## 2020-04-21 DIAGNOSIS — J431 Panlobular emphysema: Secondary | ICD-10-CM

## 2020-04-21 DIAGNOSIS — G4734 Idiopathic sleep related nonobstructive alveolar hypoventilation: Secondary | ICD-10-CM

## 2020-04-21 DIAGNOSIS — J441 Chronic obstructive pulmonary disease with (acute) exacerbation: Secondary | ICD-10-CM

## 2020-04-21 DIAGNOSIS — J95811 Postprocedural pneumothorax: Secondary | ICD-10-CM | POA: Diagnosis not present

## 2020-04-21 DIAGNOSIS — J9611 Chronic respiratory failure with hypoxia: Secondary | ICD-10-CM

## 2020-04-21 DIAGNOSIS — R918 Other nonspecific abnormal finding of lung field: Secondary | ICD-10-CM

## 2020-04-21 MED ORDER — PREDNISONE 10 MG PO TABS: ORAL_TABLET | ORAL | 0 refills | Status: DC

## 2020-04-21 MED ORDER — METHYLPREDNISOLONE ACETATE 80 MG/ML IJ SUSP
80.0000 mg | Freq: Once | INTRAMUSCULAR | Status: AC
  Administered 2020-04-21: 80 mg via INTRAMUSCULAR

## 2020-04-21 NOTE — Telephone Encounter (Signed)
Order was placed for ONO.

## 2020-04-21 NOTE — Assessment & Plan Note (Signed)
-   O2 97% RA today; continue to use 2-3L POC as needed on exertion  - Patient reports nocturnal hypoxia during most recent hospital stay. He did have small pneumothorax which likely contributed to this. Recommend Checking ONO to determine if he needs nocturnal oxygen

## 2020-04-21 NOTE — Assessment & Plan Note (Addendum)
-   Patient has been experiencing increased dyspnea and reports unexplained weight loss. Underlying pulmonary nodular disease. He previously underwent a bronchoscopy  In March 2021 with Dr. Lamonte Sakai which showed granulomas on right, negative on left. No cultures sent. He had repeat imaging which showed increased size in left upper lobe opacity. He underwent repeat bronchoscopy on 04/12/20 with Dr. Lamonte Sakai findings again consistent with granulomatous disease. Transbronchial brushing and bx samples obtained.  - Cytology showed inflammation, no malignant cells. Fungal smear negative.   - Discussed with Dr. Lamonte Sakai, we are awaiting final AFB and fungal cultures. Checking Hypersensitivity panel today. Doubt sarcoid but could consider getting ACE level if everything else is negative.

## 2020-04-21 NOTE — Telephone Encounter (Signed)
ONO order needs to include the way the ONO is to be performed.  Will you please update ONO order to reflect this?

## 2020-04-21 NOTE — Assessment & Plan Note (Signed)
-   Increased dyspnea and wheezing - CXR today showed no acute cardiopulmonary process. No pneumothorax - RX depo-medrol 80mg  IM x 1 and prednisone taper - Continue Breztri two puffs twice daily

## 2020-04-21 NOTE — Patient Instructions (Signed)
Bronchoscopy results are consistent with inflammation/ felt to be granulomatous. NO malignant cells.  We are awaiting several final cultures to come back (mainly AFB and fungal) Treating for acute exacerbation with steroids. You may need long course of an antibiotic depending on culture results  We will follow-up with you after getting final testing results  Follow-up: 1 months with Dr. Lamonte Sakai

## 2020-04-21 NOTE — Progress Notes (Signed)
_0  ID: [name], male    DOB: [birth year], 79 y.o.   MRN: [id number]  Chief Complaint  Patient presents with  . Follow-up    Referring provider: Josetta Huddle, MD  HPI: 79 year old male, former smoker quit in January 2021 (10-pack-year history).  Past medical history significant for COPD, chronic respiratory failure with hypoxia, GERD, dysphagia, sleep disorder, pulmonary nodules, mixed hyperlipidemia, hypertension, coronary arthrosclerosis.  Patient of Dr. Lamonte Sakai.  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  Unclear etiology, quantiferon Gold negative. There is still concern for possible malignancy, 6 month repeat CT chest scheduled for August 2021. Maintained on Trelegy Ellipta and prn albuterol.   Previous [facility] pulmonary encounters:  03/01/2020 Patient presents today for 11-monthfollow-up visit for COPD/pulmonary nodules. He had PET scan in February 2021 which showed hypermetabolic nodule RLL,SUV 9.5. He underwent bronchoscopy in March 2021 by Dr. BLamonte Sakaithat was consistent with granulomatous disease. Repeat Super-D CT chest on 02/26/2020 showed multiple bilateral irregular peribronchovascular pulmonary nodular densities.  Compared to previous exam these appear increased in size and number, including new nodules to left upper lobe and right lower lobe.  Location and appearance of nodular densities suggest postinflammatory/infectious process.   He called our office on 02/07/20 with complaints of increased shortness of breath when outside in heat/humidity. He feels he may need oxygen. Otherwise he feels well. No acute bronchitis symptoms. He coughs up clear mucus once or twice a day. He continues to play golf every day. He is compliant with Trelegy Ellipta. Denies fever, sweats, chest tightness or wheezing.   03/17/2020 Patient contacted today for televisit. Dr. BLamonte Sakaireviewed his scans, his nodules are more prominent on the  most recent CT scan. Last flexible bronchoscopy showed granulomas on the right, negative on the left. No AFB done. His PET was positive. He would like patient to reconsider FOB and discuss further with [place] Reviewed Super-D CT scan with patient and Dr. BAgustina Carolirecommendations. He is on board with proceeding with re-peat bronchoscopy. He will be unable on August 24th and between August 31-September 10th d/t skin surgery for squamous cell carcinoma.   In terms of his breath, nothing major has changed. He has noticed an improvement with Breztri. He is currently finishing up samples and has filled a month supply. He does have 2-3 months worth of Trelegy medication already and I told patient that he can finish these inhalers but not at the same time he is taking Breztri. Picked up oxygen concentrator for [employer] He is on RA at rest; needs oxygen on exertion especially in the heat/humidity. He is still active playing golf.   04/21/2020 - Interim history Patient presents today for hospital follow-up. He was admitted 9/14-9/15 after bronchoscopy following small pneumothorax. Chest tube removed 9/15, pneumothorax resolved . Results have been consistent with inflammation felt to be related to granulomatous disease. No malignant cells. Awaiting final fungal and AFB cultures. Feels his breathing is significantly worse following procedure. He is very limited in what he can do d/t his dyspnea. Walking from car door to office causes him to get out of breath. However his breathing improves when golfing He is still taking Breztri two puffs twice daily.  He originally had congested productive cough with grey mucus which has improved somewhat over the last several days. He uses 3L pulsed oxygen, his oxygen level was 97% on RA. He recovers quickly. Oxygen in the hospital was noted to be 79% at night, this  was following pneumothorax and likely has resolved. We will check ONO to ensure that he does not need nocturnal oxygen. He states  that he needs a home concentrator but would like to wait as he is moving in a few months. He has POC to use as needed. He has lost weight, eating normally.    04/12/20 Surgical pathology FINAL MICROSCOPIC DIAGNOSIS:  A. LUNG, RIGHT LOWER LOBE, BIOPSY:  - Scanty benign lung tissue and abundant blood.  - No granulomas or malignancy.  B. LUNG, LEFT LOWER LOBE, BIOPSY:  - Benign lung tissue and blood.  - No granulomas or malignancy.   04/12/20 Cytology FINAL MICROSCOPIC DIAGNOSIS:  A. LUNG, SUPERIOR SEGMENT RLL TARGET 1, BRUSHING:  - No malignant cells identified  B. LUNG, SUPERIOR SEGEMENT RLL TARGET 1, NEEDLE:  - No malignant cells identified  D. LUNG, LLL, BRUSHING:  - No malignant cells identified  E. LUNG, LLL, NEEDLE:  - No malignant cells identified    No Known Allergies  Immunization History  Administered Date(s) Administered  . Fluad Quad(high Dose 65+) 04/13/2020  . Influenza Split 04/19/2009, 04/26/2010, 05/14/2011  . Influenza, High Dose Seasonal PF 04/02/2018, 03/11/2019, 04/13/2020  . Influenza,inj,Quad PF,6+ Mos 03/30/2016  . PFIZER SARS-COV-2 Vaccination 08/13/2019, 09/01/2019  . Pneumococcal Conjugate-13 04/29/2014  . Pneumococcal Polysaccharide-23 09/12/2005, 05/01/2013    Past Medical History:  Diagnosis Date  . Alcohol abuse   . Arthritis   . BPH with elevated PSA   . Cancer ([place])    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  . COPD (chronic obstructive pulmonary disease) ([place])   . Coronary artery disease    2019 with stents  . Dyspnea   . ED (erectile dysfunction)   . Elevated PSA   . GERD (gastroesophageal reflux disease)   . Hyperlipidemia   . Hypertension   . Kidney cysts   . Palpitations   . Pneumonia     Tobacco History: Social History   Tobacco Use  Smoking Status Former Smoker  . Packs/day: 0.75  . Years: 67.00  . Pack years: 50.25  . Types: Cigarettes  . Start date: 53  . Quit date: 08/22/2019  . Years since quitting: 0.6    Smokeless Tobacco Never Used  Tobacco Comment   quit for 10 years between   Counseling given: Not Answered Comment: quit for 10 years between   Outpatient Medications Prior to Visit  Medication Sig Dispense Refill  . albuterol (PROVENTIL) (2.5 MG/3ML) 0.083% nebulizer solution Take 3 mLs (2.5 mg total) by nebulization every 6 (six) hours as needed for wheezing or shortness of breath. 150 mL 5  . Budeson-Glycopyrrol-Formoterol (BREZTRI AEROSPHERE) 160-9-4.8 MCG/ACT AERO Inhale 2 puffs into the lungs 2 (two) times daily. 4.8 g 5  . cholecalciferol (VITAMIN D3) 25 MCG (1000 UNIT) tablet Take 1,000 Units by mouth 3 (three) times a week.     . clopidogrel (PLAVIX) 75 MG tablet Take 1 tablet (75 mg total) by mouth daily. Ok to restart this medication on Wednesday 04/13/20. 90 tablet 3  . esomeprazole (NEXIUM) 20 MG capsule Take 20 mg by mouth daily.     . Ipratropium-Albuterol (COMBIVENT RESPIMAT) 20-100 MCG/ACT AERS respimat Inhale 1-2 puffs into the lungs every 6 (six) hours as needed for wheezing or shortness of breath.    . Melatonin 5 MG CAPS Take 5 mg by mouth at bedtime.    . nitroGLYCERIN (NITROSTAT) 0.4 MG SL tablet Place 1 tablet (0.4 mg total) under the tongue every 5 (five)  minutes as needed for chest pain. 25 tablet 11  . predniSONE (DELTASONE) 5 MG tablet Take 5 mg by mouth daily with breakfast.    . rosuvastatin (CRESTOR) 20 MG tablet TAKE 1 TABLET BY MOUTH EVERYDAY AT BEDTIME (Patient taking differently: Take 20 mg by mouth at bedtime. ) 90 tablet 1   Facility-Administered Medications Prior to Visit  Medication Dose Route Frequency Provider Last Rate Last Admin  . thallous chloride (201 THALLIUM) injection 4 milli Curie  4 millicurie Intravenous Once PRN Larey Dresser, MD       Review of Systems  Review of Systems  Constitutional: Positive for fatigue.  Respiratory: Positive for cough, shortness of breath and wheezing.   Cardiovascular: Negative.    Physical Exam  BP  120/68 (BP Location: Right Arm, Cuff Size: Normal)   Pulse 89   Temp 98 F (36.7 C) (Temporal)   Ht _0  (1.778 m)   Wt 148 lb 12.8 oz (67.5 kg)   SpO2 97%   BMI 21.35 kg/m  Physical Exam Constitutional:      Appearance: Normal appearance.  HENT:     Head: Normocephalic and atraumatic.     Mouth/Throat:     Comments: Deferred d/t masking Cardiovascular:     Rate and Rhythm: Normal rate and regular rhythm.  Pulmonary:     Effort: Pulmonary effort is normal.     Breath sounds: Wheezing present. No rhonchi or rales.  Musculoskeletal:        General: Normal range of motion.  Skin:    General: Skin is warm and dry.  Neurological:     General: No focal deficit present.     Mental Status: He is alert and oriented to person, place, and time. Mental status is at baseline.  Psychiatric:        Mood and Affect: Mood normal.        Behavior: Behavior normal.        Thought Content: Thought content normal.        Judgment: Judgment normal.      Lab Results:  CBC    Component Value Date/Time   WBC 7.8 04/12/2020 0607   RBC 4.94 04/12/2020 0607   HGB 14.7 04/12/2020 0607   HGB 15.7 04/09/2018 1650   HCT 46.3 04/12/2020 0607   HCT 44.6 04/09/2018 1650   PLT 253 04/12/2020 0607   PLT 285 04/09/2018 1650   MCV 93.7 04/12/2020 0607   MCV 88 04/09/2018 1650   MCH 29.8 04/12/2020 0607   MCHC 31.7 04/12/2020 0607   RDW 13.6 04/12/2020 0607   RDW 13.2 04/09/2018 1650   LYMPHSABS 1.4 04/05/2020 0921   LYMPHSABS 1.4 04/09/2018 1650   MONOABS 0.6 04/05/2020 0921   EOSABS 0.1 04/05/2020 0921   EOSABS 0.2 04/09/2018 1650   BASOSABS 0.1 04/05/2020 0921   BASOSABS 0.1 04/09/2018 1650    BMET    Component Value Date/Time   NA 140 04/12/2020 0607   NA 142 06/06/2018 1012   K 3.7 04/12/2020 0607   CL 102 04/12/2020 0607   CO2 30 04/12/2020 0607   GLUCOSE 95 04/12/2020 0607   BUN 14 04/12/2020 0607   BUN 12 06/06/2018 1012   CREATININE 1.18 04/12/2020 0607   CALCIUM 9.0  04/12/2020 0607   GFRNONAA 58 (L) 04/12/2020 0607   GFRAA >60 04/12/2020 0607    BNP No results found for: BNP  ProBNP No results found for: PROBNP  Imaging: DG Chest 1 View  Result  Date: 04/12/2020 CLINICAL DATA:  Pneumothorax post bronchoscopy.  Chest tube EXAM: CHEST  1 VIEW COMPARISON:  04/12/2020 FINDINGS: Interval placement of pigtail chest tube on the right in good position. Right pneumothorax has resolved. Mild bibasilar airspace disease. Negative for heart failure or effusion. IMPRESSION: Satisfactory right chest tube placement with complete resolution of right pneumothorax Mild bibasilar airspace disease. Electronically Signed   By: Franchot Gallo M.D.   On: 04/12/2020 12:57   DG Chest 2 View  Result Date: 04/21/2020 CLINICAL DATA:  Status post bronchoscopy for solitary pulmonary nodule. EXAM: CHEST - 2 VIEW COMPARISON:  April 13, 2020 FINDINGS: The heart size and mediastinal contours are within normal limits. No focal infiltrate, pulmonary edema, or pleural effusion is identified. There is no pneumothorax. The visualized skeletal structures are stable. IMPRESSION: No active cardiopulmonary disease. No pneumothorax. Electronically Signed   By: Abelardo Diesel M.D.   On: 04/21/2020 12:10   DG CHEST PORT 1 VIEW  Result Date: 04/13/2020 CLINICAL DATA:  79 year old male with right side pneumothorax status post transbronchial biopsy yesterday. Chest tube. EXAM: PORTABLE CHEST 1 VIEW COMPARISON:  Portable chest 0437 hours today and earlier. FINDINGS: Portable AP semi upright view at 1457 hours. Stable pigtail right chest tube. No residual pleural edge or pneumothorax identified. Stable lung volumes and mediastinal contours. Stable lung markings. No acute pulmonary opacity. Visualized tracheal air column is within normal limits. Stable visualized osseous structures. Negative visible bowel gas pattern. IMPRESSION: Stable right chest tube with no pneumothorax or new cardiopulmonary  abnormality identified. Electronically Signed   By: Genevie Ann M.D.   On: 04/13/2020 16:53   DG Chest Port 1 View  Result Date: 04/13/2020 CLINICAL DATA:  Follow-up right-sided pneumothorax EXAM: PORTABLE CHEST 1 VIEW COMPARISON:  04/12/2020 FINDINGS: Cardiac shadow is stable. Pigtail catheter is again noted on the right. No pneumothorax is seen. The lungs are hyperinflated. No focal infiltrate or sizable effusion is seen. No acute bony abnormality is noted. IMPRESSION: Pigtail catheter in place on the right. No recurrent pneumothorax is seen. Electronically Signed   By: Inez Catalina M.D.   On: 04/13/2020 06:45   DG Chest Port 1 View  Result Date: 04/12/2020 CLINICAL DATA:  Bronchoscopy and biopsy. EXAM: PORTABLE CHEST 1 VIEW COMPARISON:  10/13/2019 FINDINGS: Sizable right pneumothorax, likely 40- 50%. No visible alveolar hemorrhage. No mediastinal shift or diaphragm depression when allowing for chronic hyperinflation. Emphysema. Normal heart size. Critical Value/emergent results were called by telephone at the time of interpretation on 04/12/2020 at 10:34 am to [facility] , who reports that Dr Malvin Johns is aware and already treating the patient. IMPRESSION: Moderate to large right pneumothorax without mediastinal shift. Emphysema. Electronically Signed   By: Monte Fantasia M.D.   On: 04/12/2020 10:34   DG C-ARM BRONCHOSCOPY  Result Date: 04/12/2020 C-ARM BRONCHOSCOPY: Fluoroscopy was utilized by the requesting physician.  No radiographic interpretation.     Assessment & Plan:   Pulmonary nodules - Patient has been experiencing increased dyspnea and reports unexplained weight loss. Underlying pulmonary nodular disease. He previously underwent a bronchoscopy  In March 2021 with Dr. Lamonte Sakai which showed granulomas on right, negative on left. No cultures sent. He had repeat imaging which showed increased size in left upper lobe opacity. He underwent repeat bronchoscopy on 04/12/20 with Dr. Lamonte Sakai findings  again consistent with granulomatous disease. Transbronchial brushing and bx samples obtained.  - Cytology showed inflammation, no malignant cells. Fungal smear negative.   - Discussed with Dr. Lamonte Sakai, we are  awaiting final AFB and fungal cultures. Checking Hypersensitivity panel today. Doubt sarcoid but could consider getting ACE level if everything else is negative.   COPD (chronic obstructive pulmonary disease) ([facility]) - Increased dyspnea and wheezing - CXR today showed no acute cardiopulmonary process. No pneumothorax - RX depo-medrol 15m IM x 1 and prednisone taper - Continue Breztri two puffs twice daily  Chronic respiratory failure with hypoxia ([facility]) - O2 97% RA today; continue to use 2-3L POC as needed on exertion  - Patient reports nocturnal hypoxia during most recent hospital stay. He did have small pneumothorax which likely contributed to this. Recommend Checking ONO to determine if he needs nocturnal oxygen  FU 1 month    EMartyn Ehrich NP 04/21/2020

## 2020-04-22 ENCOUNTER — Other Ambulatory Visit: Payer: Self-pay

## 2020-04-22 ENCOUNTER — Ambulatory Visit: Payer: Medicare Other | Admitting: Cardiology

## 2020-04-22 DIAGNOSIS — G4733 Obstructive sleep apnea (adult) (pediatric): Secondary | ICD-10-CM

## 2020-04-22 DIAGNOSIS — G4734 Idiopathic sleep related nonobstructive alveolar hypoventilation: Secondary | ICD-10-CM

## 2020-04-25 LAB — HYPERSENSITIVITY PNEUMONITIS
A. Pullulans Abs: NEGATIVE
A.Fumigatus #1 Abs: NEGATIVE
Micropolyspora faeni, IgG: NEGATIVE
Pigeon Serum Abs: NEGATIVE
Thermoact. Saccharii: NEGATIVE
Thermoactinomyces vulgaris, IgG: NEGATIVE

## 2020-04-25 NOTE — Telephone Encounter (Signed)
Patient is asking for next steps. He had recent lab results come back (fungal and other sputum). He has a f/u with [facility] in Oct. Please advise.

## 2020-04-26 NOTE — Telephone Encounter (Signed)
AFB came back negative. I would follow-up with [facility] in October. Nothing to do right now.

## 2020-04-29 ENCOUNTER — Telehealth: Payer: Self-pay | Admitting: Emergency Medicine

## 2020-04-29 NOTE — Telephone Encounter (Signed)
Attempted to call pt to review Dr. Agustina Caroli recommendation of patient wearing 2L at night. Left detailed message.

## 2020-04-29 NOTE — Telephone Encounter (Signed)
Called patient, no answer. I'd like to know if he improves when he takes prednisone? If so, we can refill previous taper for a second time. Keep apt with Dr. Lamonte Sakai 9/21

## 2020-04-29 NOTE — Telephone Encounter (Signed)
Patient requesting another round of prednisone per mychart message sent into patient's pharmacy Heavy wheezing, shortness of breath with little activity, minimal cough and mucus, no fever. Simply can't take a deep breath   Beth can you pleas advise  Thank you .

## 2020-05-02 NOTE — Telephone Encounter (Signed)
Patient sent email  I wanted to wait until after this past weekend to respond to your message. We just returned from [place] where we went on a golf outing with several other couples. It was a good test of my ability to function.   The short answer to your question is that the prednisone is not helping as I had hoped it would and as it has in the past. I actually haven't felt this bad since I had pneumonia in 2012. I find myself relying on the concentrator just to do simple tasks and I am not happy about that.   We need to get this inflammation under control. The last time it took two regimens of prednisone to control this,  but please do or prescribe any antibiotics that will help.   Phone is [phone]  [name] [name]  BW or RB please advise

## 2020-05-03 MED ORDER — PREDNISONE 10 MG PO TABS
ORAL_TABLET | ORAL | 0 refills | Status: DC
Start: 2020-05-03 — End: 2020-09-12

## 2020-05-03 MED ORDER — CEFDINIR 300 MG PO CAPS: 300.0000 mg | ORAL_CAPSULE | Freq: Two times a day (BID) | ORAL | 0 refills | Status: DC

## 2020-05-03 NOTE — Telephone Encounter (Signed)
I'm sorry to hear he is having such a difficult time. We can send in another course of prednisone taper and cefdinir for COPD exacerbation. He has follow-up with Dr. Lamonte Sakai on 05/19/20.

## 2020-05-03 NOTE — Telephone Encounter (Signed)
D/t exposure I would recommend he get tested for covid. Has he had the booster yet?

## 2020-05-03 NOTE — Telephone Encounter (Signed)
Please advise. Thanks.  

## 2020-05-04 NOTE — Telephone Encounter (Signed)
Routing to Beth as an FYI. 

## 2020-05-04 NOTE — Telephone Encounter (Signed)
Great to hear that, thank you

## 2020-05-09 NOTE — Telephone Encounter (Signed)
I think I need to see Mr Rickey to trouble shoot this.  Can we set him up to see me this week? OK to use one of my blocked slots.

## 2020-05-09 NOTE — Telephone Encounter (Signed)
RB please advise. Thanks.  

## 2020-05-09 NOTE — Telephone Encounter (Signed)
Called pt to see if we could schedule a OV with Dr. Lamonte Sakai this week. Left message to return telephone call to office.

## 2020-05-12 ENCOUNTER — Other Ambulatory Visit: Payer: Self-pay

## 2020-05-12 ENCOUNTER — Ambulatory Visit (INDEPENDENT_AMBULATORY_CARE_PROVIDER_SITE_OTHER): Payer: Medicare Other | Admitting: Emergency Medicine

## 2020-05-12 ENCOUNTER — Other Ambulatory Visit: Payer: Self-pay | Admitting: Primary Care

## 2020-05-12 DIAGNOSIS — R918 Other nonspecific abnormal finding of lung field: Secondary | ICD-10-CM

## 2020-05-12 DIAGNOSIS — R0609 Other forms of dyspnea: Secondary | ICD-10-CM

## 2020-05-12 DIAGNOSIS — R0600 Dyspnea, unspecified: Secondary | ICD-10-CM | POA: Diagnosis not present

## 2020-05-12 LAB — FUNGUS CULTURE WITH STAIN

## 2020-05-12 LAB — FUNGAL ORGANISM REFLEX

## 2020-05-12 LAB — FUNGUS CULTURE RESULT

## 2020-05-12 MED ORDER — BREZTRI AEROSPHERE 160-9-4.8 MCG/ACT IN AERO: 2.0000 | INHALATION_SPRAY | Freq: Two times a day (BID) | RESPIRATORY_TRACT | 0 refills | Status: DC

## 2020-05-19 ENCOUNTER — Ambulatory Visit: Payer: Medicare Other | Admitting: Emergency Medicine

## 2020-05-19 NOTE — Telephone Encounter (Signed)
I am ok if Hal would like to stop the prednisone - we weren't really sure how much difference it was going to make before the trial. I don't have any other meds that I would like to add or that I believe will give benefit right now.

## 2020-05-19 NOTE — Telephone Encounter (Signed)
Dr. Byrum, please see pt's mychart message and advise. 

## 2020-05-20 NOTE — Telephone Encounter (Signed)
Definitely possible.  When he is at rest his oxygen needs are at their lowest. When he is awake and moving around the oxygen needs go up.  Additionally, it may take some time after he wakes up for him to start using his lung capacity to its maximum.  He does need to insure that he is using his oxygen w exertion, and that he isn't desaturating below 90% at any time.

## 2020-05-20 NOTE — Telephone Encounter (Signed)
Received the following message from patient:   "Ask Dr. Lamonte Sakai this. For 8 hours a night my lungs are getting all the oxygen it needs without having to work for it. I wake up, turn the concentrator off and all of a sudden the lungs have to work to get the same amount it got during the night. So, my breathing is really labored in the morning and gradually, gradually improves as the day progresses.   Is that possible???"  Dr. Lamonte Sakai, please advise. Thanks!

## 2020-05-23 NOTE — Telephone Encounter (Signed)
Pt returning a phone call. Pt can be reached at [phone].

## 2020-05-23 NOTE — Telephone Encounter (Signed)
05/23/2020  I am unable to comment on this as I do not see a completed note from 05/12/2020.  Patient has been seen by EW NP in the past.  Would recommend setting up patient for a follow-up appointment with her.  Wyn Quaker, FNP

## 2020-05-23 NOTE — Telephone Encounter (Signed)
Called the pt to get more information. Had to [facility].

## 2020-05-23 NOTE — Telephone Encounter (Signed)
Called and spoke with the pt  He states that his breathing has not improved at all since seen Dr Lamonte Sakai on 05/12/20  The note from that visit is incomplete due to power going out that day  Pt states that at that visit, Dr Lamonte Sakai started pt on prednisone 30 mg  He states that although he is not worse, he has seen no improvement- still winded walking room to room at home  He denies any new co's- f/c/s, CP, cough, wheezing  He states that Dr Lamonte Sakai had mentioned that he had "a plan B" if the pred didn't help  I notified the pt and Dr Lamonte Sakai is out of the office this week  He is asking if there is anything that can be done in the meantime  He is currently still taking Breztri bid and using his combivent 1-2 x per day as well as neb with albuterol  Please advise, thanks!

## 2020-05-26 ENCOUNTER — Other Ambulatory Visit: Payer: Self-pay

## 2020-05-26 LAB — ACID FAST CULTURE WITH REFLEXED SENSITIVITIES (MYCOBACTERIA)
Acid Fast Culture: NEGATIVE
Acid Fast Culture: NEGATIVE

## 2020-05-30 ENCOUNTER — Ambulatory Visit: Payer: Medicare Other | Admitting: Podiatry

## 2020-05-30 ENCOUNTER — Encounter: Payer: Self-pay | Admitting: Cardiology

## 2020-05-30 ENCOUNTER — Other Ambulatory Visit: Payer: Self-pay

## 2020-05-30 ENCOUNTER — Ambulatory Visit: Payer: Medicare Other | Admitting: Cardiology

## 2020-05-30 ENCOUNTER — Encounter: Payer: Self-pay | Admitting: Podiatry

## 2020-05-30 VITALS — BP 134/74 | HR 92 | Ht 70.5 in | Wt 150.6 lb

## 2020-05-30 DIAGNOSIS — I10 Essential (primary) hypertension: Secondary | ICD-10-CM

## 2020-05-30 DIAGNOSIS — I2510 Atherosclerotic heart disease of native coronary artery without angina pectoris: Secondary | ICD-10-CM

## 2020-05-30 DIAGNOSIS — D689 Coagulation defect, unspecified: Secondary | ICD-10-CM | POA: Diagnosis not present

## 2020-05-30 DIAGNOSIS — Q828 Other specified congenital malformations of skin: Secondary | ICD-10-CM | POA: Diagnosis not present

## 2020-05-30 DIAGNOSIS — E782 Mixed hyperlipidemia: Secondary | ICD-10-CM

## 2020-05-30 DIAGNOSIS — M205X2 Other deformities of toe(s) (acquired), left foot: Secondary | ICD-10-CM

## 2020-05-30 NOTE — Patient Instructions (Signed)

## 2020-05-30 NOTE — Progress Notes (Signed)
This patient returns to my office for at risk foot care.  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  Patient develops a painful callus under left hallux.  This patient presents for at risk foot care today.  General Appearance  Alert, conversant and in no acute stress.  Vascular  Dorsalis pedis and posterior tibial  pulses are palpable  bilaterally.  Capillary return is within normal limits  bilaterally. Temperature is within normal limits  bilaterally.  Neurologic  Senn-Weinstein monofilament wire test within normal limits  bilaterally. Muscle power within normal limits bilaterally.  Nails Thick disfigured discolored nails with subungual debris  from hallux to fifth toes bilaterally. No evidence of bacterial infection or drainage bilaterally.  Orthopedic  No limitations of motion  feet .  No crepitus or effusions noted.  No bony pathology or digital deformities noted.  Skin  normotropic skin with no porokeratosis noted bilaterally.  No signs of infections or ulcers noted.   Callus plantar aspect left hallux.  Porokeratosis left foot   Consent was obtained for treatment procedures.  Debridement of callus with # 15 blade.     Return office visit   prn                  Told patient to return for periodic foot care and evaluation due to potential at risk complications.   Gardiner Barefoot DPM

## 2020-05-30 NOTE — Progress Notes (Signed)
Cardiology Office Note:    Date:  05/30/2020   ID:  [name] Kemps., DOB 1940/12/31, MRN [id number]  PCP:  Josetta Huddle, MD  Cardiologist:  Jenean Lindau, MD   Referring MD: Josetta Huddle, MD    ASSESSMENT:    1. Atherosclerosis of native coronary artery of native heart without angina pectoris   2. Essential (primary) hypertension   3. Mixed hyperlipidemia    PLAN:    In order of problems listed above:  1. Coronary artery disease: Secondary prevention stressed with the patient.  Importance of compliance with diet medication stressed any vocalized understanding. 2. Essential hypertension: Blood pressure is stable. 3. Dyslipidemia: Diet was emphasized.  He had blood work done by his primary care physician and will obtain a copy. 4. Advanced COPD with significant pulmonary issues.  Shortness of breath on exertion which is chronic.  This is managed by his primary care physician.  Patient is currently on steroids. 5. Patient will be seen in follow-up appointment in 6 months or earlier if the patient has any concerns 6. Patient had multiple questions which were answered to his satisfaction.   Medication Adjustments/Labs and Tests Ordered: Current medicines are reviewed at length with the patient today.  Concerns regarding medicines are outlined above.  No orders of the defined types were placed in this encounter.  No orders of the defined types were placed in this encounter.    No chief complaint on file.    History of Present Illness:    [name] [name]. is a 79 y.o. male.  Patient has past medical history of coronary artery disease, essential hypertension and dyslipidemia he has significant COPD he denies any problems at this time.  Activities of daily living.  He complains of shortness of breath on exertion which is chronic.  This is managed by his pulmonologist.  He had chest tightness prestenting but subsequently has had no chest tightness issues.  At the time of  my evaluation, the patient is alert awake oriented and in no distress.  Past Medical History:  Diagnosis Date  . Acquired trigger finger of right middle finger 02/04/2020  . Acute prostatitis 04/08/2018  . Alcohol abuse   . Anal or rectal pain 04/08/2018  . Arthritis   . Atherosclerotic heart disease of native coronary artery without angina pectoris 04/08/2018  . BPH with elevated PSA   . 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 Chronic respiratory failure with hypoxia ([place]) 08/10/2019  . Cigarette nicotine dependence, uncomplicated 0/03/2329  . Coagulation disorder ([place]) 01/13/2019  . 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 COPD (chronic obstructive pulmonary disease) ([place])   . Corns and callosities 04/09/2018  . Coronary artery disease    2019 with stents  . Coronary atherosclerosis 05/07/2018  . Diarrhea 04/09/2018  . 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 Dysphagia 08/14/2018  . Dyspnea   . ED (erectile dysfunction)   . Elevated prostate specific antigen (PSA) 04/08/2018  . Elevated PSA   . Enlarged prostate without lower urinary tract symptoms (luts) 04/09/2018  . Essential (primary) hypertension 04/08/2018  . Ex-smoker 04/09/2018  . Exertional dyspnea 04/02/2018  . 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 Flatulence 04/08/2018  . Gastro-esophageal reflux disease without esophagitis 04/08/2018  . GERD (gastroesophageal reflux disease)   . History of acute otitis externa 08/14/2018  . Hyperlipidemia   . Hypertension   . Kidney cysts   . Low back pain 04/08/2018  .  Male erectile dysfunction 04/09/2018  . Mixed hyperlipidemia 04/08/2018  . Nocturia 04/09/2018  . Osteoarthritis of first carpometacarpal joint 04/08/2018  . Pain due to onychomycosis of toenail of left foot 07/20/2019  . Pain in joint 04/09/2018  . Pain in knee 04/09/2018  . Pain of finger 04/09/2018  . Palpitations   . Pneumonia   . Pneumothorax 04/12/2020  . Polypharmacy 04/09/2018  . Porokeratosis 01/13/2019   . Pulmonary nodules 06/08/2016  . Sleep disorder 04/08/2018  . Status post bronchoscopy 04/12/2020  . Tear of rotator cuff 04/09/2018  . Uncomplicated alcohol dependence ([place]) 04/09/2018  . Vitamin D deficiency 04/08/2018    Past Surgical History:  Procedure Laterality Date  . BRONCHIAL BIOPSY  10/13/2019   Procedure: BRONCHIAL BIOPSIES;  Surgeon: Collene Gobble, MD;  Location: [facility] ENDOSCOPY;  Service: Pulmonary;;  . BRONCHIAL BIOPSY  04/12/2020   Procedure: BRONCHIAL BIOPSIES;  Surgeon: Collene Gobble, MD;  Location: [facility] ENDOSCOPY;  Service: Pulmonary;;  . BRONCHIAL BRUSHINGS  10/13/2019   Procedure: BRONCHIAL BRUSHINGS;  Surgeon: Collene Gobble, MD;  Location: [facility] ENDOSCOPY;  Service: Pulmonary;;  . BRONCHIAL BRUSHINGS  04/12/2020   Procedure: BRONCHIAL BRUSHINGS;  Surgeon: Collene Gobble, MD;  Location: [facility] ENDOSCOPY;  Service: Pulmonary;;  . BRONCHIAL NEEDLE ASPIRATION BIOPSY  10/13/2019   Procedure: BRONCHIAL NEEDLE ASPIRATION BIOPSIES;  Surgeon: Collene Gobble, MD;  Location: [facility] ENDOSCOPY;  Service: Pulmonary;;  . BRONCHIAL NEEDLE ASPIRATION BIOPSY  04/12/2020   Procedure: BRONCHIAL NEEDLE ASPIRATION BIOPSIES;  Surgeon: Collene Gobble, MD;  Location: [facility] ENDOSCOPY;  Service: Pulmonary;;  . BRONCHIAL WASHINGS  10/13/2019   Procedure: BRONCHIAL WASHINGS;  Surgeon: Collene Gobble, MD;  Location: [place];  Service: Pulmonary;;  . BRONCHIAL WASHINGS  04/12/2020   Procedure: BRONCHIAL WASHINGS;  Surgeon: Collene Gobble, MD;  Location: [place];  Service: Pulmonary;;  . CARDIAC CATHETERIZATION  2002   50% RCA  . CATARACT EXTRACTION, BILATERAL    . CORONARY STENT INTERVENTION N/A 04/15/2018   Procedure: CORONARY STENT INTERVENTION;  Surgeon: Jettie Booze, MD;  Location: [place] CV LAB;  Service: Cardiovascular;  Laterality: N/A;  om1  . EYE SURGERY    . KNEE ARTHROSCOPY    . LEFT HEART CATH AND CORONARY ANGIOGRAPHY N/A 04/15/2018   Procedure: LEFT HEART CATH AND CORONARY  ANGIOGRAPHY;  Surgeon: Jettie Booze, MD;  Location: [place] CV LAB;  Service: Cardiovascular;  Laterality: N/A;  . MULTIPLE TOOTH EXTRACTIONS    . TONSILLECTOMY    . VIDEO BRONCHOSCOPY  04/12/2020  . VIDEO BRONCHOSCOPY WITH ENDOBRONCHIAL NAVIGATION N/A 07/26/2016   Procedure: VIDEO BRONCHOSCOPY WITH ENDOBRONCHIAL NAVIGATION;  Surgeon: Collene Gobble, MD;  Location: [place];  Service: Thoracic;  Laterality: N/A;  . VIDEO BRONCHOSCOPY WITH ENDOBRONCHIAL NAVIGATION N/A 10/13/2019   Procedure: [facility] AND VIDEO BRONCHOSCOPY WITH ENDOBRONCHIAL NAVIGATION;  Surgeon: Collene Gobble, MD;  Location: [place] ENDOSCOPY;  Service: Pulmonary;  Laterality: N/A;  . VIDEO BRONCHOSCOPY WITH ENDOBRONCHIAL NAVIGATION N/A 04/12/2020   Procedure: VIDEO BRONCHOSCOPY WITH ENDOBRONCHIAL NAVIGATION;  Surgeon: Collene Gobble, MD;  Location: [place] ENDOSCOPY;  Service: Pulmonary;  Laterality: N/A;    Current Medications: Current Meds  Medication Sig  . albuterol (PROVENTIL) (2.5 MG/3ML) 0.083% nebulizer solution Take 3 mLs (2.5 mg total) by nebulization every 6 (six) hours as needed for wheezing or shortness of breath.  . Budeson-Glycopyrrol-Formoterol (BREZTRI AEROSPHERE) 160-9-4.8 MCG/ACT AERO Inhale 2 puffs into the lungs 2 (two) times daily.  . cholecalciferol (VITAMIN D3) 25  MCG (1000 UNIT) tablet Take 1,000 Units by mouth 3 (three) times a week.   . clopidogrel (PLAVIX) 75 MG tablet Take 1 tablet (75 mg total) by mouth daily. Ok to restart this medication on Wednesday 04/13/20.  Marland Kitchen esomeprazole (NEXIUM) 20 MG capsule Take 20 mg by mouth daily.   . Ipratropium-Albuterol (COMBIVENT RESPIMAT) 20-100 MCG/ACT AERS respimat Inhale 1-2 puffs into the lungs every 6 (six) hours as needed for wheezing or shortness of breath.  . ketoconazole (NIZORAL) 2 % cream Apply 1 application topically daily.  . Melatonin 5 MG CAPS Take 5 mg by mouth at bedtime.  . nitroGLYCERIN (NITROSTAT) 0.4 MG SL tablet Place 1 tablet (0.4 mg total)  under the tongue every 5 (five) minutes as needed for chest pain.  . predniSONE (DELTASONE) 10 MG tablet Take 4 tabs po daily x 3 days; then 3 tabs daily x3 days; then 2 tabs daily x3 days; then 1 tab daily x 3 days; then resume 5mg  daily  . rosuvastatin (CRESTOR) 20 MG tablet TAKE 1 TABLET BY MOUTH EVERYDAY AT BEDTIME     Allergies:   Patient has no known allergies.   Social History   Socioeconomic History  . Marital status: Married    Spouse name: Not on file  . Number of children: Not on file  . Years of education: Not on file  . Highest education level: Not on file  Occupational History  . Not on file  Tobacco Use  . Smoking status: Former Smoker    Packs/day: 0.75    Years: 67.00    Pack years: 50.25    Types: Cigarettes    Start date: 71    Quit date: 08/22/2019    Years since quitting: 0.7  . Smokeless tobacco: Never Used  . Tobacco comment: quit for 10 years between  Vaping Use  . Vaping Use: Former  Substance and Sexual Activity  . Alcohol use: No    Alcohol/week: 0.0 standard drinks    Comment: quit drinking 2014  . Drug use: No  . Sexual activity: Not on file  Other Topics Concern  . Not on file  Social History Narrative  . Not on file   Social Determinants of Health   Financial Resource Strain:   . Difficulty of Paying Living Expenses: Not on file  Food Insecurity:   . Worried About Charity fundraiser in the Last Year: Not on file  . Ran Out of Food in the Last Year: Not on file  Transportation Needs:   . Lack of Transportation (Medical): Not on file  . Lack of Transportation (Non-Medical): Not on file  Physical Activity:   . Days of Exercise per Week: Not on file  . Minutes of Exercise per Session: Not on file  Stress:   . Feeling of Stress : Not on file  Social Connections:   . Frequency of Communication with Friends and Family: Not on file  . Frequency of Social Gatherings with Friends and Family: Not on file  . Attends Religious Services:  Not on file  . Active Member of Clubs or Organizations: Not on file  . Attends Archivist Meetings: Not on file  . Marital Status: Not on file     Family History: The patient's family history includes Alzheimer's disease in his mother; Hypertension in his mother.  ROS:   Please see the history of present illness.    All other systems reviewed and are negative.  EKGs/Labs/Other Studies Reviewed:  The following studies were reviewed today: EKG reveals sinus rhythm and nonspecific ST-T changes.   Recent Labs: 04/12/2020: ALT <5; BUN 14; Creatinine, Ser 1.18; Hemoglobin 14.7; Platelets 253; Potassium 3.7; Sodium 140  Recent Lipid Panel    Component Value Date/Time   CHOL 122 06/06/2018 1012   TRIG 54 06/06/2018 1012   HDL 61 06/06/2018 1012   CHOLHDL 2.0 06/06/2018 1012   LDLCALC 50 06/06/2018 1012    Physical Exam:    VS:  BP 134/74   Pulse 92   Ht 5' 10.5" (1.791 m)   Wt 150 lb 9.6 oz (68.3 kg)   SpO2 95%   BMI 21.30 kg/m     Wt Readings from Last 3 Encounters:  05/30/20 150 lb 9.6 oz (68.3 kg)  04/21/20 148 lb 12.8 oz (67.5 kg)  04/12/20 150 lb (68 kg)     GEN: Patient is in no acute distress HEENT: Normal NECK: No JVD; No carotid bruits LYMPHATICS: No lymphadenopathy CARDIAC: Hear sounds regular, 2/6 systolic murmur at the apex. RESPIRATORY:  Clear to auscultation without rales, wheezing or rhonchi  ABDOMEN: Soft, non-tender, non-distended MUSCULOSKELETAL:  No edema; No deformity  SKIN: Warm and dry NEUROLOGIC:  Alert and oriented x 3 PSYCHIATRIC:  Normal affect   Signed, Jenean Lindau, MD  05/30/2020 11:35 AM    [place]

## 2020-05-31 ENCOUNTER — Encounter: Payer: Self-pay | Admitting: Emergency Medicine

## 2020-05-31 ENCOUNTER — Ambulatory Visit: Payer: Medicare Other | Admitting: Emergency Medicine

## 2020-05-31 ENCOUNTER — Other Ambulatory Visit: Payer: Self-pay | Admitting: Emergency Medicine

## 2020-05-31 DIAGNOSIS — J431 Panlobular emphysema: Secondary | ICD-10-CM

## 2020-05-31 DIAGNOSIS — J9611 Chronic respiratory failure with hypoxia: Secondary | ICD-10-CM

## 2020-05-31 DIAGNOSIS — R918 Other nonspecific abnormal finding of lung field: Secondary | ICD-10-CM

## 2020-05-31 MED ORDER — BREZTRI AEROSPHERE 160-9-4.8 MCG/ACT IN AERO: 2.0000 | INHALATION_SPRAY | Freq: Two times a day (BID) | RESPIRATORY_TRACT | 0 refills | Status: DC

## 2020-05-31 NOTE — Assessment & Plan Note (Signed)
He has severe disease that has really started to catch up with him over the last 6 months.  His functional capacity is worsened, now an oxygen requirement.  He sometimes changes his maintenance inhaler regimen depending on what he has available and cost.  Currently on Breztri, but he has Trelegy at home and he may decide to use this when when his current Judithann Sauger runs out.  He has Combivent to use as needed but also some old [place] that he occasionally uses as needed. He did not seem to get much benefit from the recent course of prednisone.  He may benefit from cardiopulmonary rehab.  He be willing to undertake this at some point in the future after he gets settled at his new assisted living.  Please continue Breztri 2 puffs twice a day.  Rinse and gargle after using. Keep your Combivent available to use 2 puffs up to every 6 hours if needed for shortness of breath, chest tightness, wheezing. You may benefit from undertaking cardiopulmonary rehab to build up your stamina.  We can talk about a referral for this next time. We will follow up in January or sooner if you have any new problems.  We can talk about the timing of any repeat CT scan of your chest at that visit.

## 2020-05-31 NOTE — Assessment & Plan Note (Signed)
Again no evidence of malignancy on repeat bronchoscopy. We'll follow his culture data. Given the granulomatous nature on at least his pulmonary cytology okay for trial of 3 weeks of steroids although this clinical picture not entirely consistent with sarcoidosis. Would be unusual for the isolated nodular infiltrates to cause his dyspnea. Treat with prednisone for 3 weeks.

## 2020-05-31 NOTE — Assessment & Plan Note (Signed)
Bilateral lower lobe nodules.  He is undergone multiple bronchoscopies that have all showed no evidence for malignancy.  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  HSP panel negative.  I treated him with 3 weeks prednisone more because he was concerned about his worsening dyspnea than because I thought his nodular disease represented clinically relevant sarcoidosis.  We will plan to follow his CT chest for interval stability.

## 2020-05-31 NOTE — Progress Notes (Signed)
Subjective:    Patient ID: [name] [name]., male    DOB: 06-May-1941, 79 y.o.   MRN: [id number]  COPD He complains of shortness of breath. There is no cough or wheezing. Pertinent negatives include no ear pain, fever, headaches, postnasal drip, rhinorrhea, sneezing, sore throat or trouble swallowing. His past medical history is significant for COPD.    ROV 11/06/19 -- 79 year old man with a history of COPD and pulmonary nodular disease.  We have been following with serial imaging, last was 08/20/2019.  He had a new area of right lower lobe thickening 2.3 x 1.7 cm as well as a new left lower lobe nodule.  He underwent repeat navigational bronchoscopy on 10/13/2019.  Right lower lobe brushings and biopsies showed granulomatous inflammation.  The left lower lobe samples were all negative.  Cultures are similarly negative.  No AFB smear available. He remains on Trelegy, minimal albuterol use.   OV 05/12/20 --we repeated Hal's bronchoscopy on 04/12/2020 to follow his bilateral infiltrates and pulmonary nodular disease. This showed granulomatous disease least on the preliminary read. No malignancy on final path. He has been dealing with progressive dyspnea, he has a new oxygen requirement. Was seen in our office and treated with cefdinir and prednisone taper. He is on Breztri, has Combivent to use as needed. Has been using it about 1-2 times daily. His shortness of breath is progressive, worse. He is having shortness of breath even just standing. He is not using his oxygen regularly, has checked his SPO2 and has not noticed any lows.  ROV 05/31/20 --79 year old man with pulmonary nodule disease which she had been following with serial imaging and also have investigated with repeat bronchoscopies. His last was on 04/12/2020. Again no malignancy was found. There may have been some hint of granulomatous inflammation on initial read although this was not included in the formal pathology report. HSP panel negative  04/21/20.  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 Based on his progressive dyspnea and a possible granulomatous disease I committed to treating him with a 3-week course of prednisone. He reports today that his breathing is largely unchanged.  He is using Breztri reliably, uses combivent a few times a day. Sometimes he uses left over Dulera prn.                           Review of Systems  Constitutional: Negative for fever and unexpected weight change.  HENT: Negative for congestion, dental problem, ear pain, nosebleeds, postnasal drip, rhinorrhea, sinus pressure, sneezing, sore throat and trouble swallowing.   Eyes: Negative for redness and itching.  Respiratory: Positive for shortness of breath. Negative for cough, chest tightness and wheezing.   Cardiovascular: Negative for palpitations and leg swelling.  Gastrointestinal: Negative for nausea and vomiting.  Genitourinary: Negative for dysuria.  Musculoskeletal: Negative for joint swelling.  Skin: Negative for rash.  Neurological: Negative for headaches.  Hematological: Does not bruise/bleed easily.  Psychiatric/Behavioral: Negative for dysphoric mood. The patient is not nervous/anxious.        Objective:   Physical Exam Vitals:   05/31/20 1124  BP: 130/70  Pulse: 96  SpO2: 91%  Weight: 148 lb 3.2 oz (67.2 kg)  Height: 5' 10.5" (1.791 m)   Gen: Pleasant, well-nourished, in no distress,  normal affect  ENT: No lesions,  mouth clear,  oropharynx clear, no postnasal drip  Neck: No JVD, mild UA noise  Lungs:  No use of accessory muscles, distant,  no crackles or wheeze today  Cardiovascular: RRR, heart sounds normal, no murmur or gallops, no peripheral edema  Musculoskeletal: No deformities, no cyanosis or clubbing  Neuro: alert, non focal  Skin: Warm, no lesions or rash      Assessment & Plan:  COPD (chronic obstructive pulmonary disease) ([place]) He has severe  disease that has really started to catch up with him over the last 6 months.  His functional capacity is worsened, now an oxygen requirement.  He sometimes changes his maintenance inhaler regimen depending on what he has available and cost.  Currently on Breztri, but he has Trelegy at home and he may decide to use this when when his current Judithann Sauger runs out.  He has Combivent to use as needed but also some old [place] that he occasionally uses as needed. He did not seem to get much benefit from the recent course of prednisone.  He may benefit from cardiopulmonary rehab.  He be willing to undertake this at some point in the future after he gets settled at his new assisted living.  Please continue Breztri 2 puffs twice a day.  Rinse and gargle after using. Keep your Combivent available to use 2 puffs up to every 6 hours if needed for shortness of breath, chest tightness, wheezing. You may benefit from undertaking cardiopulmonary rehab to build up your stamina.  We can talk about a referral for this next time. We will follow up in January or sooner if you have any new problems.  We can talk about the timing of any repeat CT scan of your chest at that visit.  Pulmonary nodules Bilateral lower lobe nodules.  He is undergone multiple bronchoscopies that have all showed no evidence for malignancy.  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  HSP panel negative.  I treated him with 3 weeks prednisone more because he was concerned about his worsening dyspnea than because I thought his nodular disease represented clinically relevant sarcoidosis.  We will plan to follow his CT chest for interval stability.    Chronic respiratory failure with hypoxia (H[facility]) Continue oxygen as he has been using it  Baltazar Apo, MD, PhD 05/31/2020, 2:20 PM [facility] [phone] or if no answer [phone]

## 2020-05-31 NOTE — Assessment & Plan Note (Signed)
Continue oxygen as he has been using it

## 2020-05-31 NOTE — Telephone Encounter (Signed)
Pt is requesting refill on PREDNISONE 10 MG TABLET had ov today 06/01/20

## 2020-05-31 NOTE — Progress Notes (Signed)
Subjective:    Patient ID: [name] [name]., male    DOB: 1940-08-06, 79 y.o.   MRN: [id number]  COPD He complains of shortness of breath. There is no cough or wheezing. Pertinent negatives include no ear pain, fever, headaches, postnasal drip, rhinorrhea, sneezing, sore throat or trouble swallowing. His past medical history is significant for COPD.    ROV 11/06/19 -- 79 year old man with a history of COPD and pulmonary nodular disease.  We have been following with serial imaging, last was 08/20/2019.  He had a new area of right lower lobe thickening 2.3 x 1.7 cm as well as a new left lower lobe nodule.  He underwent repeat navigational bronchoscopy on 10/13/2019.  Right lower lobe brushings and biopsies showed granulomatous inflammation.  The left lower lobe samples were all negative.  Cultures are similarly negative.  No AFB smear available. He remains on Trelegy, minimal albuterol use.   OV 05/12/20 --we repeated [facility] bronchoscopy on 04/12/2020 to follow his bilateral infiltrates and pulmonary nodular disease. This showed granulomatous disease least on the preliminary read. No malignancy on final path. He has been dealing with progressive dyspnea, he has a new oxygen requirement. Was seen in our office and treated with cefdinir and prednisone taper. He is on Breztri, has Combivent to use as needed. Has been using it about 1-2 times daily. His shortness of breath is progressive, worse. He is having shortness of breath even just standing. He is not using his oxygen regularly, has checked his SPO2 and has not noticed any lows.                                                          Review of Systems  Constitutional: Negative for fever and unexpected weight change.  HENT: Negative for congestion, dental problem, ear pain, nosebleeds, postnasal drip, rhinorrhea, sinus pressure, sneezing, sore throat and trouble swallowing.   Eyes: Negative for redness and itching.  Respiratory: Positive for  shortness of breath. Negative for cough, chest tightness and wheezing.   Cardiovascular: Negative for palpitations and leg swelling.  Gastrointestinal: Negative for nausea and vomiting.  Genitourinary: Negative for dysuria.  Musculoskeletal: Negative for joint swelling.  Skin: Negative for rash.  Neurological: Negative for headaches.  Hematological: Does not bruise/bleed easily.  Psychiatric/Behavioral: Negative for dysphoric mood. The patient is not nervous/anxious.        Objective:   Physical Exam Vitals:   05/12/20 1521  BP: 112/60  Pulse: 86  SpO2: 95%   Gen: Pleasant, well-nourished, in no distress,  normal affect  ENT: No lesions,  mouth clear,  oropharynx clear, no postnasal drip  Neck: No JVD, no stridor  Lungs: No use of accessory muscles, distant,  no crackles or wheeze today  Cardiovascular: RRR, heart sounds normal, no murmur or gallops, no peripheral edema  Musculoskeletal: No deformities, no cyanosis or clubbing  Neuro: alert, non focal  Skin: Warm, no lesions or rash      Assessment & Plan:  Pulmonary nodules Again no evidence of malignancy on repeat bronchoscopy. We'll follow his culture data. Given the granulomatous nature on at least his pulmonary cytology okay for trial of 3 weeks of steroids although this clinical picture not entirely consistent with sarcoidosis. Would be unusual for the isolated nodular infiltrates to cause his dyspnea.  Treat with prednisone for 3 weeks.  Exertional dyspnea Fairly rapidly progressive and severe with significant limitations, new hypoxemia. Suspect that this is due to his COPD with possibly some overlying deconditioning. 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 He is on a schedule bronchodilator regimen, now submental oxygen. We'll check a CBC, TSH. See if he has any response to corticosteroid as above.  Baltazar Apo, MD, PhD 05/31/2020, 9:40 AM [facility] [phone] or if no answer  [phone]

## 2020-05-31 NOTE — Patient Instructions (Addendum)
Please continue Breztri 2 puffs twice a day.  Rinse and gargle after using. Keep your Combivent available to use 2 puffs up to every 6 hours if needed for shortness of breath, chest tightness, wheezing. Continue your oxygen at all times as you have been using it. Finish your prednisone this week and then stop.  Keep track of how your symptoms change when the prednisone ends. You may benefit from undertaking cardiopulmonary rehab to build up your stamina.  We can talk about a referral for this next time. We will follow up in January or sooner if you have any new problems.  We can talk about the timing of any repeat CT scan of your chest at that visit.

## 2020-05-31 NOTE — Assessment & Plan Note (Signed)
Fairly rapidly progressive and severe with significant limitations, new hypoxemia. Suspect that this is due to his COPD with possibly some overlying deconditioning. 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 He is on a schedule bronchodilator regimen, now submental oxygen. We'll check a CBC, TSH. See if he has any response to corticosteroid as above.

## 2020-06-06 NOTE — Telephone Encounter (Signed)
Dr. Byrum, please see mychart message sent by pt and advise. 

## 2020-06-06 NOTE — Telephone Encounter (Signed)
Dr. Lamonte Sakai, Please see update from patient, just an FYI.  Thank you.

## 2020-07-04 ENCOUNTER — Encounter: Payer: Self-pay | Admitting: Podiatry

## 2020-07-04 NOTE — Telephone Encounter (Signed)
Will you fix the address that was included.

## 2020-07-08 NOTE — Telephone Encounter (Signed)
Let him know that OFEV is a medication used to prevent progression of Interstitial Lung Disease. I don't know of any role for it in COPD.   Combivent at his current dosing is a good rescue medication for him. It doesn't have any other dose   I do want him to use his O2 with all exertion

## 2020-07-08 NOTE — Telephone Encounter (Signed)
Dr. Lamonte Sakai, this message came for you this morning.  My breathing has been a struggle the past few days. I'm on the concentrator for most activity.  I have two friends with the same lung disease, not COPD. Both are on a medication, "Ofab", and they have experienced significant  results. Is this a med that would benefit COPD?  Is Combivent Respimate the best of the emergency inhalers or does it come with a larger dose than the 20/100 mg of medication?   [place]   Message routed to Dr. Lamonte Sakai

## 2020-07-25 NOTE — Telephone Encounter (Signed)
Dr. Delton Coombes please respond to the following My Chart message: I have been tentatively asked to participate in a liquid nitrogen cryospray study for people with COPD or Bronchitis sponsored by [facility]. Do you have any thoughts or opinions about my participation?

## 2020-07-25 NOTE — Telephone Encounter (Signed)
I've seen some preliminary studies, although unclear whether it has shown itself to be helpful yet  I would support his participation as long as he makes sure the treatments don't cause him to have problematic symptoms.

## 2020-07-25 NOTE — Telephone Encounter (Signed)
Pt was notified via My Chart response of Dr. Kavin Leech recommendations. Nothing further needed at this time.

## 2020-08-16 DIAGNOSIS — J9611 Chronic respiratory failure with hypoxia: Secondary | ICD-10-CM | POA: Diagnosis not present

## 2020-08-16 DIAGNOSIS — J449 Chronic obstructive pulmonary disease, unspecified: Secondary | ICD-10-CM | POA: Diagnosis not present

## 2020-08-18 ENCOUNTER — Other Ambulatory Visit: Payer: Self-pay

## 2020-08-18 ENCOUNTER — Encounter: Payer: Self-pay | Admitting: Podiatry

## 2020-08-18 ENCOUNTER — Ambulatory Visit: Payer: Medicare Other | Admitting: Podiatry

## 2020-08-18 DIAGNOSIS — Q828 Other specified congenital malformations of skin: Secondary | ICD-10-CM | POA: Diagnosis not present

## 2020-08-18 DIAGNOSIS — D689 Coagulation defect, unspecified: Secondary | ICD-10-CM | POA: Diagnosis not present

## 2020-08-18 DIAGNOSIS — M205X2 Other deformities of toe(s) (acquired), left foot: Secondary | ICD-10-CM | POA: Diagnosis not present

## 2020-08-18 NOTE — Progress Notes (Signed)
This patient returns to my office for at risk foot care.  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  Patient develops a painful callus under left hallux.  This patient presents for at risk foot care today. ° °General Appearance  Alert, conversant and in no acute stress. ° °Vascular  Dorsalis pedis and posterior tibial  pulses are palpable  bilaterally.  Capillary return is within normal limits  bilaterally. Temperature is within normal limits  bilaterally. ° °Neurologic  Senn-Weinstein monofilament wire test within normal limits  bilaterally. Muscle power within normal limits bilaterally. ° °Nails Thick disfigured discolored nails with subungual debris  from hallux to fifth toes bilaterally. No evidence of bacterial infection or drainage bilaterally. ° °Orthopedic  No limitations of motion  feet .  No crepitus or effusions noted.  No bony pathology or digital deformities noted. ° °Skin  normotropic skin with no porokeratosis noted bilaterally.  No signs of infections or ulcers noted.   Callus plantar aspect left hallux. ° °Porokeratosis left foot ° ° °Consent was obtained for treatment procedures.  Debridement of callus with # 15 blade.   ° ° °Return office visit   prn                  Told patient to return for periodic foot care and evaluation due to potential at risk complications. ° ° °Cordarius Benning DPM  °

## 2020-08-23 DIAGNOSIS — J431 Panlobular emphysema: Secondary | ICD-10-CM

## 2020-08-23 NOTE — Telephone Encounter (Signed)
Please advise on patient mychart message  We are now completely settled into our new home at [facility]) in [place]. You had once mentioned my working with a pulmonary rehab specialist for breathing exercises, etc. I'm writing to ask for a referral to such a group/person preferably here in [place], but [place] is OK.

## 2020-08-24 ENCOUNTER — Other Ambulatory Visit: Payer: Self-pay | Admitting: Cardiology

## 2020-08-24 NOTE — Telephone Encounter (Signed)
Rx refill sent to pharmacy. 

## 2020-08-29 NOTE — Telephone Encounter (Signed)
Yes please refer him to pulm rehab at [facility] if possible, diagnosis is emphysema. Thanks

## 2020-09-12 ENCOUNTER — Encounter: Payer: Self-pay | Admitting: *Deleted

## 2020-09-12 ENCOUNTER — Other Ambulatory Visit: Payer: Self-pay

## 2020-09-12 ENCOUNTER — Encounter: Payer: Medicare Other | Attending: Emergency Medicine | Admitting: *Deleted

## 2020-09-12 DIAGNOSIS — Z87891 Personal history of nicotine dependence: Secondary | ICD-10-CM | POA: Insufficient documentation

## 2020-09-12 DIAGNOSIS — Z79899 Other long term (current) drug therapy: Secondary | ICD-10-CM | POA: Insufficient documentation

## 2020-09-12 DIAGNOSIS — J431 Panlobular emphysema: Secondary | ICD-10-CM

## 2020-09-12 NOTE — Progress Notes (Signed)
  Virtual orientation call completed today. he has an appointment on Date: 09/15/2020 for EP eval and gym Orientation.  Documentation of diagnosis can be found in [facility]  Date: 05/31/2020.

## 2020-09-15 ENCOUNTER — Encounter: Payer: Medicare Other | Admitting: *Deleted

## 2020-09-15 ENCOUNTER — Other Ambulatory Visit: Payer: Self-pay

## 2020-09-15 VITALS — Ht 69.75 in | Wt 150.2 lb

## 2020-09-15 DIAGNOSIS — J431 Panlobular emphysema: Secondary | ICD-10-CM | POA: Diagnosis not present

## 2020-09-15 DIAGNOSIS — Z79899 Other long term (current) drug therapy: Secondary | ICD-10-CM | POA: Diagnosis not present

## 2020-09-15 DIAGNOSIS — Z87891 Personal history of nicotine dependence: Secondary | ICD-10-CM | POA: Diagnosis not present

## 2020-09-15 NOTE — Progress Notes (Signed)
Pulmonary Individual Treatment Plan  Patient Details  Name: [name] [name]. 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  albuterol (PROVENTIL) (2.5 MG/3ML) 0.083% nebulizer solution, Take 3 mLs (2.5 mg total) by nebulization every 6 (six) hours as needed for wheezing or shortness of breath., Disp: 150 mL, Rfl: 5 .  Budeson-Glycopyrrol-Formoterol (BREZTRI AEROSPHERE) 160-9-4.8 MCG/ACT AERO, Inhale 2 puffs into the lungs 2 (two) times daily. (Patient not taking: Reported on 09/12/2020), Disp: 4.8 g, Rfl: 5 .  Budeson-Glycopyrrol-Formoterol (BREZTRI AEROSPHERE) 160-9-4.8 MCG/ACT AERO, Inhale 2 puffs into the lungs 2 (two) times daily., Disp: 10.7 g, Rfl: 0 .  cholecalciferol (VITAMIN D3) 25 MCG (1000 UNIT) tablet, Take 1,000 Units by mouth 3 (three) times a week. , Disp: , Rfl:  .  clopidogrel (PLAVIX) 75 MG tablet, Take 1 tablet (75 mg total) by mouth daily. Ok to restart this medication on Wednesday 04/13/20., Disp: 90 tablet, Rfl: 3 .  esomeprazole (NEXIUM) 20 MG capsule, Take 20 mg by mouth daily. , Disp: , Rfl:  .  Ipratropium-Albuterol (COMBIVENT RESPIMAT) 20-100 MCG/ACT AERS respimat, Inhale 1-2 puffs into the lungs every 6 (six) hours as needed for wheezing or shortness of breath., Disp: , Rfl:  .  ketoconazole (NIZORAL) 2 % cream, Apply 1 application topically daily. (Patient not taking: Reported on 09/12/2020), Disp: , Rfl:  .  Melatonin 5 MG CAPS, Take 5 mg by mouth at bedtime., Disp: , Rfl:  .  nitroGLYCERIN (NITROSTAT) 0.4 MG SL tablet, Place 1 tablet (0.4 mg total) under the tongue every 5  (five) minutes as needed for chest pain., Disp: 25 tablet, Rfl: 11 .  predniSONE (DELTASONE) 5 MG tablet, Take 5 mg by mouth daily., Disp: , Rfl:  .  rosuvastatin (CRESTOR) 20 MG tablet, TAKE 1 TABLET BY MOUTH EVERYDAY AT BEDTIME, Disp: 90 tablet, Rfl: 1 .  tamsulosin (FLOMAX) 0.4 MG CAPS capsule, 1 capsule, Disp: , Rfl:  No current facility-administered medications for this visit.  Facility-Administered Medications Ordered in Other Visits:  .  thallous chloride (201 THALLIUM) injection 4 milli Curie, 4 millicurie, Intravenous, Once PRN, Larey Dresser, MD  Past Medical History: Past Medical History:  Diagnosis Date  . Acquired trigger finger of right middle finger 02/04/2020  . Acute prostatitis 04/08/2018  . Alcohol abuse   . Anal or rectal pain 04/08/2018  . Arthritis   . Atherosclerotic heart disease of native coronary artery without angina pectoris 04/08/2018  . BPH with elevated PSA   . 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 Chronic respiratory failure with hypoxia ([place]) 08/10/2019  . Cigarette nicotine dependence, uncomplicated 10/30/4740  . Coagulation disorder ([place]) 01/13/2019  . 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 COPD (chronic obstructive pulmonary disease) ([place])   . Corns and callosities 04/09/2018  . Coronary artery disease    2019 with stents  . Coronary atherosclerosis 05/07/2018  . Diarrhea 04/09/2018  . 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 Dysphagia 08/14/2018  . Dyspnea   . ED (erectile dysfunction)   . Elevated prostate specific antigen (PSA) 04/08/2018  . Elevated  PSA   . Enlarged prostate without lower urinary tract symptoms (luts) 04/09/2018  . Essential (primary) hypertension 04/08/2018  . Ex-smoker 04/09/2018  . Exertional dyspnea 04/02/2018  . 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 Flatulence 04/08/2018  . Gastro-esophageal reflux disease without esophagitis 04/08/2018  . GERD (gastroesophageal reflux disease)   . History of acute otitis  externa 08/14/2018  . Hyperlipidemia   . Hypertension   . Kidney cysts   . Low back pain 04/08/2018  . Male erectile dysfunction 04/09/2018  . Mixed hyperlipidemia 04/08/2018  . Nocturia 04/09/2018  . Osteoarthritis of first carpometacarpal joint 04/08/2018  . Pain due to onychomycosis of toenail of left foot 07/20/2019  . Pain in joint 04/09/2018  . Pain in knee 04/09/2018  . Pain of finger 04/09/2018  . Palpitations   . Pneumonia   . Pneumothorax 04/12/2020  . Polypharmacy 04/09/2018  . Porokeratosis 01/13/2019  . Pulmonary nodules 06/08/2016  . Sleep disorder 04/08/2018  . Status post bronchoscopy 04/12/2020  . Tear of rotator cuff 04/09/2018  . Uncomplicated alcohol dependence ([place]) 04/09/2018  . Vitamin D deficiency 04/08/2018    Tobacco Use: Social History   Tobacco Use  Smoking Status Former Smoker  . Packs/day: 0.75  . Years: 67.00  . Pack years: 50.25  . Types: Cigarettes  . Start date: 57  . Quit date: 07/13/2020  . Years since quitting: 0.1  Smokeless Tobacco Never Used  Tobacco Comment   Recent Quit      Labs: Recent Review Flowsheet Data    Labs for ITP Cardiac and Pulmonary Rehab Latest Ref Rng & Units 06/06/2018   Cholestrol 100 - 199 mg/dL 122   LDLCALC 0 - 99 mg/dL 50   HDL >39 mg/dL 61   Trlycerides 0 - 149 mg/dL 54       Pulmonary Assessment Scores:  Pulmonary Assessment Scores    Row Name 09/15/20 1347         ADL UCSD   ADL Phase Entry     SOB Score total 56     Rest 0     Walk 2     Stairs 4     Bath 3     Dress 2     Shop 3           CAT Score   CAT Score 16           mMRC Score   mMRC Score 3            UCSD: Self-administered rating of dyspnea associated with activities of daily living (ADLs) 6-point scale (0 = "not at all" to 5 = "maximal or unable to do because of breathlessness")  Scoring Scores range from 0 to 120.  Minimally important difference is 5 units  CAT: CAT can identify the health impairment of COPD patients  and is better correlated with disease progression.  CAT has a scoring range of zero to 40. The CAT score is classified into four groups of low (less than 10), medium (10 - 20), high (21-30) and very high (31-40) based on the impact level of disease on health status. A CAT score over 10 suggests significant symptoms.  A worsening CAT score could be explained by an exacerbation, poor medication adherence, poor inhaler technique, or progression of COPD or comorbid conditions.  CAT MCID is 2 points  mMRC: mMRC (Modified Medical Research Council) Dyspnea Scale is used to assess the degree of baseline functional disability in patients of respiratory  disease due to dyspnea. No minimal important difference is established. A decrease in score of 1 point or greater is considered a positive change.   Pulmonary Function Assessment:  Pulmonary Function Assessment - 09/15/20 1347      Breath   Shortness of Breath Yes;Fear of Shortness of Breath;Limiting activity           Exercise Target Goals: Exercise Program Goal: Individual exercise prescription set using results from initial 6 min walk test and THRR while considering  patient's activity barriers and safety.   Exercise Prescription Goal: Initial exercise prescription builds to 30-45 minutes a day of aerobic activity, 2-3 days per week.  Home exercise guidelines will be given to patient during program as part of exercise prescription that the participant will acknowledge.  Education: Aerobic Exercise: - Group verbal and visual presentation on the components of exercise prescription. Introduces F.I.T.T principle from ACSM for exercise prescriptions.  Reviews F.I.T.T. principles of aerobic exercise including progression. Written material given at graduation.   Education: Resistance Exercise: - Group verbal and visual presentation on the components of exercise prescription. Introduces F.I.T.T principle from ACSM for exercise prescriptions  Reviews  F.I.T.T. principles of resistance exercise including progression. Written material given at graduation.    Education: Exercise & Equipment Safety: - Individual verbal instruction and demonstration of equipment use and safety with use of the equipment. Flowsheet Row Pulmonary Rehab from 09/15/2020 in [facility] Cardiac and Pulmonary Rehab  Date 09/15/20  Educator [facility]  Instruction Review Code 1- Verbalizes Understanding      Education: Exercise Physiology & General Exercise Guidelines: - Group verbal and written instruction with models to review the exercise physiology of the cardiovascular system and associated critical values. Provides general exercise guidelines with specific guidelines to those with heart or lung disease.    Education: Flexibility, Balance, Mind/Body Relaxation: - Group verbal and visual presentation with interactive activity on the components of exercise prescription. Introduces F.I.T.T principle from ACSM for exercise prescriptions. Reviews F.I.T.T. principles of flexibility and balance exercise training including progression. Also discusses the mind body connection.  Reviews various relaxation techniques to help reduce and manage stress (i.e. Deep breathing, progressive muscle relaxation, and visualization). Balance handout provided to take home. Written material given at graduation.   Activity Barriers & Risk Stratification:  Activity Barriers & Cardiac Risk Stratification - 09/15/20 1339      Activity Barriers & Cardiac Risk Stratification   Activity Barriers Shortness of Breath;Balance Concerns;Other (comment);Deconditioning;Muscular Weakness    Comments minimizes walking because of SOB.           6 Minute Walk:  6 Minute Walk    Row Name 09/15/20 1336         6 Minute Walk   Phase Initial     Distance 580 feet     Walk Time 4.18 minutes     # of Rest Breaks 2  16 sec, 1:33     MPH 1.58     METS 1.74     RPE 15     Perceived Dyspnea  3     VO2 Peak 6.04      Symptoms Yes (comment)     Comments SOB, leg fatigue     Resting HR 85 bpm     Resting BP 126/64     Resting Oxygen Saturation  99 %     Exercise Oxygen Saturation  during 6 min walk 92 %     Max Ex. HR 98 bpm  Max Ex. BP 156/74     2 Minute Post BP 136/74           Interval HR   1 Minute HR 85     2 Minute HR 82     3 Minute HR 90     4 Minute HR 94     5 Minute HR 98     6 Minute HR 83     2 Minute Post HR 94     Interval Heart Rate? Yes           Interval Oxygen   Interval Oxygen? Yes     Baseline Oxygen Saturation % 99 %     1 Minute Oxygen Saturation % 95 %     1 Minute Liters of Oxygen 4 L  pulsed     2 Minute Oxygen Saturation % 94 %     2 Minute Liters of Oxygen 4 L     3 Minute Oxygen Saturation % 91 %     3 Minute Liters of Oxygen 5 L     4 Minute Oxygen Saturation % 92 %  seated     4 Minute Liters of Oxygen 5 L     5 Minute Oxygen Saturation % 93 %     5 Minute Liters of Oxygen 5 L     6 Minute Oxygen Saturation % 92 %     6 Minute Liters of Oxygen 5 L     2 Minute Post Oxygen Saturation % 96 %     2 Minute Post Liters of Oxygen 4 L           Oxygen Initial Assessment:  Oxygen Initial Assessment - 09/15/20 1346      Home Oxygen   Home Oxygen Device Portable Concentrator    Sleep Oxygen Prescription Pulsed    Liters per minute 2    Home Exercise Oxygen Prescription Pulsed    Liters per minute 4    Home Resting Oxygen Prescription None    Compliance with Home Oxygen Use Yes      Initial 6 min Walk   Oxygen Used Pulsed;Portable Concentrator    Liters per minute 4      Program Oxygen Prescription   Program Oxygen Prescription Continuous;E-Tanks    Liters per minute 4      Intervention   Short Term Goals To learn and exhibit compliance with exercise, home and travel O2 prescription;To learn and understand importance of monitoring SPO2 with pulse oximeter and demonstrate accurate use of the pulse oximeter.;To learn and understand importance  of maintaining oxygen saturations>88%;To learn and demonstrate proper pursed lip breathing techniques or other breathing techniques.;To learn and demonstrate proper use of respiratory medications    Long  Term Goals Exhibits compliance with exercise, home and travel O2 prescription;Verbalizes importance of monitoring SPO2 with pulse oximeter and return demonstration;Maintenance of O2 saturations>88%;Exhibits proper breathing techniques, such as pursed lip breathing or other method taught during program session;Compliance with respiratory medication;Demonstrates proper use of MDI's           Oxygen Re-Evaluation:   Oxygen Discharge (Final Oxygen Re-Evaluation):   Initial Exercise Prescription:  Initial Exercise Prescription - 09/15/20 1300      Date of Initial Exercise RX and Referring Provider   Date 09/15/20    Referring Provider Baltazar Apo MD      Oxygen   Oxygen Continuous    Liters 4      Treadmill   MPH 1.3  Grade 0.5    Minutes 15    METs 2.09      Recumbant Bike   Level 1    RPM 50    Watts 6    Minutes 15    METs 2      Arm Ergometer   Level 1    Watts 18    RPM 25    Minutes 15    METs 2      REL-XR   Level 1    Speed 50    Minutes 15    METs 2      Prescription Details   Frequency (times per week) 3    Duration Progress to 30 minutes of continuous aerobic without signs/symptoms of physical distress      Intensity   THRR 40-80% of Max Heartrate 107-130    Ratings of Perceived Exertion 11-13    Perceived Dyspnea 0-4      Progression   Progression Continue to progress workloads to maintain intensity without signs/symptoms of physical distress.      Resistance Training   Training Prescription Yes    Weight 3 lb    Reps 10-15           Perform Capillary Blood Glucose checks as needed.  Exercise Prescription Changes:  Exercise Prescription Changes    Row Name 09/15/20 1300             Response to Exercise   Blood Pressure  (Admit) 126/64       Blood Pressure (Exercise) 156/74       Blood Pressure (Exit) 122/68       Heart Rate (Admit) 85 bpm       Heart Rate (Exercise) 98 bpm       Heart Rate (Exit) 87 bpm       Oxygen Saturation (Admit) 99 %       Oxygen Saturation (Exercise) 91 %       Oxygen Saturation (Exit) 99 %       Rating of Perceived Exertion (Exercise) 15       Perceived Dyspnea (Exercise) 3       Symptoms SOB, leg fatigue       Comments walk test results              Exercise Comments:   Exercise Goals and Review:  Exercise Goals    Row Name 09/15/20 1342             Exercise Goals   Increase Physical Activity Yes       Intervention Provide advice, education, support and counseling about physical activity/exercise needs.;Develop an individualized exercise prescription for aerobic and resistive training based on initial evaluation findings, risk stratification, comorbidities and participant's personal goals.       Expected Outcomes Short Term: Attend rehab on a regular basis to increase amount of physical activity.;Long Term: Add in home exercise to make exercise part of routine and to increase amount of physical activity.;Long Term: Exercising regularly at least 3-5 days a week.       Increase Strength and Stamina Yes       Intervention Provide advice, education, support and counseling about physical activity/exercise needs.;Develop an individualized exercise prescription for aerobic and resistive training based on initial evaluation findings, risk stratification, comorbidities and participant's personal goals.       Expected Outcomes Short Term: Perform resistance training exercises routinely during rehab and add in resistance training at home;Short Term: Increase workloads from initial exercise  prescription for resistance, speed, and METs.;Long Term: Improve cardiorespiratory fitness, muscular endurance and strength as measured by increased METs and functional capacity (6MWT)       Able  to understand and use rate of perceived exertion (RPE) scale Yes       Intervention Provide education and explanation on how to use RPE scale       Expected Outcomes Short Term: Able to use RPE daily in rehab to express subjective intensity level;Long Term:  Able to use RPE to guide intensity level when exercising independently       Able to understand and use Dyspnea scale Yes       Intervention Provide education and explanation on how to use Dyspnea scale       Expected Outcomes Short Term: Able to use Dyspnea scale daily in rehab to express subjective sense of shortness of breath during exertion;Long Term: Able to use Dyspnea scale to guide intensity level when exercising independently       Knowledge and understanding of Target Heart Rate Range (THRR) Yes       Intervention Provide education and explanation of THRR including how the numbers were predicted and where they are located for reference       Expected Outcomes Short Term: Able to state/look up THRR;Short Term: Able to use daily as guideline for intensity in rehab;Long Term: Able to use THRR to govern intensity when exercising independently       Able to check pulse independently Yes       Intervention Provide education and demonstration on how to check pulse in carotid and radial arteries.;Review the importance of being able to check your own pulse for safety during independent exercise       Expected Outcomes Short Term: Able to explain why pulse checking is important during independent exercise;Long Term: Able to check pulse independently and accurately       Understanding of Exercise Prescription Yes       Intervention Provide education, explanation, and written materials on patient's individual exercise prescription       Expected Outcomes Short Term: Able to explain program exercise prescription;Long Term: Able to explain home exercise prescription to exercise independently              Exercise Goals Re-Evaluation  :   Discharge Exercise Prescription (Final Exercise Prescription Changes):  Exercise Prescription Changes - 09/15/20 1300      Response to Exercise   Blood Pressure (Admit) 126/64    Blood Pressure (Exercise) 156/74    Blood Pressure (Exit) 122/68    Heart Rate (Admit) 85 bpm    Heart Rate (Exercise) 98 bpm    Heart Rate (Exit) 87 bpm    Oxygen Saturation (Admit) 99 %    Oxygen Saturation (Exercise) 91 %    Oxygen Saturation (Exit) 99 %    Rating of Perceived Exertion (Exercise) 15    Perceived Dyspnea (Exercise) 3    Symptoms SOB, leg fatigue    Comments walk test results           Nutrition:  Target Goals: Understanding of nutrition guidelines, daily intake of sodium 1500mg , cholesterol 200mg , calories 30% from fat and 7% or less from saturated fats, daily to have 5 or more servings of fruits and vegetables.  Education: All About Nutrition: -Group instruction provided by verbal, written material, interactive activities, discussions, models, and posters to present general guidelines for heart healthy nutrition including fat, fiber, MyPlate, the role of sodium in heart  healthy nutrition, utilization of the nutrition label, and utilization of this knowledge for meal planning. Follow up email sent as well. Written material given at graduation.   Biometrics:  Pre Biometrics - 09/15/20 1343      Pre Biometrics   Height 5' 9.75" (1.772 m)    Weight 150 lb 3.2 oz (68.1 kg)    BMI (Calculated) 21.7    Single Leg Stand 2.9 seconds            Nutrition Therapy Plan and Nutrition Goals:   Nutrition Assessments:  MEDIFICTS Score Key:  ?70 Need to make dietary changes   40-70 Heart Healthy Diet  ? 40 Therapeutic Level Cholesterol Diet  Flowsheet Row Pulmonary Rehab from 09/15/2020 in [facility] Cardiac and Pulmonary Rehab  Picture Your Plate Total Score on Admission 54     Picture Your Plate Scores:  <74 Unhealthy dietary pattern with much room for  improvement.  41-50 Dietary pattern unlikely to meet recommendations for good health and room for improvement.  51-60 More healthful dietary pattern, with some room for improvement.   >60 Healthy dietary pattern, although there may be some specific behaviors that could be improved.   Nutrition Goals Re-Evaluation:   Nutrition Goals Discharge (Final Nutrition Goals Re-Evaluation):   Psychosocial: Target Goals: Acknowledge presence or absence of significant depression and/or stress, maximize coping skills, provide positive support system. Participant is able to verbalize types and ability to use techniques and skills needed for reducing stress and depression.   Education: Stress, Anxiety, and Depression - Group verbal and visual presentation to define topics covered.  Reviews how body is impacted by stress, anxiety, and depression.  Also discusses healthy ways to reduce stress and to treat/manage anxiety and depression.  Written material given at graduation.   Education: Sleep Hygiene -Provides group verbal and written instruction about how sleep can affect your health.  Define sleep hygiene, discuss sleep cycles and impact of sleep habits. Review good sleep hygiene tips.    Initial Review & Psychosocial Screening:  Initial Psych Review & Screening - 09/12/20 1136      Barriers   Psychosocial barriers to participate in program There are no identifiable barriers or psychosocial needs.      Screening Interventions   Interventions Encouraged to exercise;To provide support and resources with identified psychosocial needs;Provide feedback about the scores to participant           Quality of Life Scores:  Scores of 19 and below usually indicate a poorer quality of life in these areas.  A difference of  2-3 points is a clinically meaningful difference.  A difference of 2-3 points in the total score of the Quality of Life Index has been associated with significant improvement in overall  quality of life, self-image, physical symptoms, and general health in studies assessing change in quality of life.  PHQ-9: Recent Review Flowsheet Data    Depression screen [facility] 2/9 09/15/2020   Decreased Interest 0   Down, Depressed, Hopeless 0   PHQ - 2 Score 0   Altered sleeping 0   Tired, decreased energy 0   Change in appetite 0   Feeling bad or failure about yourself  0   Trouble concentrating 0   Moving slowly or fidgety/restless 0   Suicidal thoughts 0   PHQ-9 Score 0   Difficult doing work/chores Not difficult at all     Interpretation of Total Score  Total Score Depression Severity:  1-4 = Minimal depression, 5-9 = Mild  depression, 10-14 = Moderate depression, 15-19 = Moderately severe depression, 20-27 = Severe depression   Psychosocial Evaluation and Intervention:  Psychosocial Evaluation - 09/12/20 1137      Psychosocial Evaluation & Interventions   Interventions Encouraged to exercise with the program and follow exercise prescription    Comments Shammond has no barriers to entering the program. He lives in a [place] at [place] with his wife and their dog. THey had moved to [facility] last year from [facility]. He keeps busy with social activities- a golf group on Sundays which is golf and then a meal. He has not been able to play golf for the past 4-5 months as the course has restrictions on how close a cart can get to the green and his shortness of breath limits his ability to play with the retsitctions.  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 He stays active planning golf outings out of town at least 2 times a year.  He does not usually walk the dog , as his wife does that. He does take to dog to their dog pasrk at [facility] and to the dog park in [place]. He does have a portable oxygen concentrator that he will use if needed during the day. Walking to the car and then off when driving is an example. He quit tobacco in Dec 2021 and  feels confident he will not resume tobacco use. He lives in a smoke free environment. His shortness of breath is limintg to him. He is hoping to see improvement to enable him to participate more in his social activities. He is concerned that he is losing too much weight. He is around 150 pounds from 175 pounds last year. He did find an article that speaks to the calori needs of a patient with COPD and is interested in learning more so he can gain back some weight and stop weight loss.    Expected Outcomes STG: Levy attends all scheduled appointments, he remains tobacco free. He meets with RD about his weight concerns.  LTG: Able to maintian tobacco cessation, has learned about maintianing his weight, ways to improve his SOB and is able to use the tools for improve quality of life           Psychosocial Re-Evaluation:   Psychosocial Discharge (Final Psychosocial Re-Evaluation):   Education: Education Goals: Education classes will be provided on a weekly basis, covering required topics. Participant will state understanding/return demonstration of topics presented.  Learning Barriers/Preferences:  Learning Barriers/Preferences - 09/12/20 1124      Learning Barriers/Preferences   Learning Barriers None    Learning Preferences None           General Pulmonary Education Topics:  Infection Prevention: - Provides verbal and written material to individual with discussion of infection control including proper hand washing and proper equipment cleaning during exercise session. Flowsheet Row Pulmonary Rehab from 09/15/2020 in [facility] Cardiac and Pulmonary Rehab  Date 09/15/20  Educator [facility]  Instruction Review Code 1- Verbalizes Understanding      Falls Prevention: - Provides verbal and written material to individual with discussion of falls prevention and safety. Flowsheet Row Pulmonary Rehab from 09/15/2020 in [facility] Cardiac and Pulmonary Rehab  Date 09/12/20  Educator SB  Instruction  Review Code 1- Verbalizes Understanding      Chronic Lung Disease Review: - Group verbal instruction with posters, models, PowerPoint presentations and videos,  to review new  updates, new respiratory medications, new advancements in procedures and treatments. Providing information on websites and "800" numbers for continued self-education. Includes information about supplement oxygen, available portable oxygen systems, continuous and intermittent flow rates, oxygen safety, concentrators, and Medicare reimbursement for oxygen. Explanation of Pulmonary Drugs, including class, frequency, complications, importance of spacers, rinsing mouth after steroid MDI's, and proper cleaning methods for nebulizers. Review of basic lung anatomy and physiology related to function, structure, and complications of lung disease. Review of risk factors. Discussion about methods for diagnosing sleep apnea and types of masks and machines for OSA. Includes a review of the use of types of environmental controls: home humidity, furnaces, filters, dust mite/pet prevention, HEPA vacuums. Discussion about weather changes, air quality and the benefits of nasal washing. Instruction on Warning signs, infection symptoms, calling MD promptly, preventive modes, and value of vaccinations. Review of effective airway clearance, coughing and/or vibration techniques. Emphasizing that all should Create an Action Plan. Written material given at graduation. Flowsheet Row Pulmonary Rehab from 09/15/2020 in [facility] Cardiac and Pulmonary Rehab  Education need identified 09/15/20      AED/CPR: - Group verbal and written instruction with the use of models to demonstrate the basic use of the AED with the basic ABC's of resuscitation.    Anatomy and Cardiac Procedures: - Group verbal and visual presentation and models provide information about basic cardiac anatomy and function. Reviews the testing methods done to diagnose heart disease and the outcomes  of the test results. Describes the treatment choices: Medical Management, Angioplasty, or Coronary Bypass Surgery for treating various heart conditions including Myocardial Infarction, Angina, Valve Disease, and Cardiac Arrhythmias.  Written material given at graduation.   Medication Safety: - Group verbal and visual instruction to review commonly prescribed medications for heart and lung disease. Reviews the medication, class of the drug, and side effects. Includes the steps to properly store meds and maintain the prescription regimen.  Written material given at graduation.   Other: -Provides group and verbal instruction on various topics (see comments)   Knowledge Questionnaire Score:  Knowledge Questionnaire Score - 09/15/20 1344      Knowledge Questionnaire Score   Pre Score 16/18 Education Focus: O2 safety            Core Components/Risk Factors/Patient Goals at Admission:  Personal Goals and Risk Factors at Admission - 09/15/20 1345      Core Components/Risk Factors/Patient Goals on Admission    Weight Management Yes;Weight Gain    Intervention Weight Management: Develop a combined nutrition and exercise program designed to reach desired caloric intake, while maintaining appropriate intake of nutrient and fiber, sodium and fats, and appropriate energy expenditure required for the weight goal.;Weight Management: Provide education and appropriate resources to help participant work on and attain dietary goals.    Admit Weight 150 lb 3.2 oz (68.1 kg)    Goal Weight: Short Term 152 lb (68.9 kg)    Goal Weight: Long Term 170 lb (77.1 kg)    Expected Outcomes Short Term: Continue to assess and modify interventions until short term weight is achieved;Long Term: Adherence to nutrition and physical activity/exercise program aimed toward attainment of established weight goal;Weight Gain: Understanding of general recommendations for a high calorie, high protein meal plan that promotes weight  gain by distributing calorie intake throughout the day with the consumption for 4-5 meals, snacks, and/or supplements    Tobacco Cessation Yes    Number of packs per day o since 07/13/2020    Intervention Assist  the participant in steps to quit. Provide individualized education and counseling about committing to Tobacco Cessation, relapse prevention, and pharmacological support that can be provided by physician.;Advice worker, assist with locating and accessing local/national Quit Smoking programs, and support quit date choice.    Expected Outcomes Short Term: Will demonstrate readiness to quit, by selecting a quit date.;Short Term: Will quit all tobacco product use, adhering to prevention of relapse plan.;Long Term: Complete abstinence from all tobacco products for at least 12 months from quit date.    Improve shortness of breath with ADL's Yes    Intervention Provide education, individualized exercise plan and daily activity instruction to help decrease symptoms of SOB with activities of daily living.    Expected Outcomes Short Term: Improve cardiorespiratory fitness to achieve a reduction of symptoms when performing ADLs;Long Term: Be able to perform more ADLs without symptoms or delay the onset of symptoms    Hypertension Yes    Intervention Provide education on lifestyle modifcations including regular physical activity/exercise, weight management, moderate sodium restriction and increased consumption of fresh fruit, vegetables, and low fat dairy, alcohol moderation, and smoking cessation.;Monitor prescription use compliance.    Expected Outcomes Short Term: Continued assessment and intervention until BP is < 140/61mm HG in hypertensive participants. < 130/84mm HG in hypertensive participants with diabetes, heart failure or chronic kidney disease.;Long Term: Maintenance of blood pressure at goal levels.    Lipids Yes    Intervention Provide education and support for participant on  nutrition & aerobic/resistive exercise along with prescribed medications to achieve LDL 70mg , HDL >40mg .    Expected Outcomes Short Term: Participant states understanding of desired cholesterol values and is compliant with medications prescribed. Participant is following exercise prescription and nutrition guidelines.;Long Term: Cholesterol controlled with medications as prescribed, with individualized exercise RX and with personalized nutrition plan. Value goals: LDL < 70mg , HDL > 40 mg.           Education:Diabetes - Individual verbal and written instruction to review signs/symptoms of diabetes, desired ranges of glucose level fasting, after meals and with exercise. Acknowledge that pre and post exercise glucose checks will be done for 3 sessions at entry of program.   Know Your Numbers and Heart Failure: - Group verbal and visual instruction to discuss disease risk factors for cardiac and pulmonary disease and treatment options.  Reviews associated critical values for Overweight/Obesity, Hypertension, Cholesterol, and Diabetes.  Discusses basics of heart failure: signs/symptoms and treatments.  Introduces Heart Failure Zone chart for action plan for heart failure.  Written material given at graduation.   Core Components/Risk Factors/Patient Goals Review:    Core Components/Risk Factors/Patient Goals at Discharge (Final Review):    ITP Comments:  ITP Comments    Row Name 09/12/20 1152 09/15/20 1336         ITP Comments Virtual orientation call completed today. he has an appointment on Date: 09/15/2020 for EP eval and gym Orientation.  Documentation of diagnosis can be found in [facility]  Date: 05/31/2020. Completed 6MWT and gym orientation. Initial ITP created and sent for review to Dr. Emily Filbert, Medical Director.             Comments: Initial ITP

## 2020-09-15 NOTE — Patient Instructions (Signed)
Patient Instructions  Patient Details  Name: [name] [name]. MRN: [id number] Date of Birth: [birth year] Referring Provider:  Collene Gobble, MD  Below are your personal goals for exercise, nutrition, and risk factors. Our goal is to help you stay on track towards obtaining and maintaining these goals. We will be discussing your progress on these goals with you throughout the program.  Initial Exercise Prescription:  Initial Exercise Prescription - 09/15/20 1300      Date of Initial Exercise RX and Referring Provider   Date 09/15/20    Referring Provider Baltazar Apo MD      Oxygen   Oxygen Continuous    Liters 4      Treadmill   MPH 1.3    Grade 0.5    Minutes 15    METs 2.09      Recumbant Bike   Level 1    RPM 50    Watts 6    Minutes 15    METs 2      Arm Ergometer   Level 1    Watts 18    RPM 25    Minutes 15    METs 2      REL-XR   Level 1    Speed 50    Minutes 15    METs 2      Prescription Details   Frequency (times per week) 3    Duration Progress to 30 minutes of continuous aerobic without signs/symptoms of physical distress      Intensity   THRR 40-80% of Max Heartrate 107-130    Ratings of Perceived Exertion 11-13    Perceived Dyspnea 0-4      Progression   Progression Continue to progress workloads to maintain intensity without signs/symptoms of physical distress.      Resistance Training   Training Prescription Yes    Weight 3 lb    Reps 10-15           Exercise Goals: Frequency: Be able to perform aerobic exercise two to three times per week in program working toward 2-5 days per week of home exercise.  Intensity: Work with a perceived exertion of 11 (fairly light) - 15 (hard) while following your exercise prescription.  We will make changes to your prescription with you as you progress through the program.   Duration: Be able to do 30 to 45 minutes of continuous aerobic exercise in addition to a 5 minute warm-up and a 5  minute cool-down routine.   Nutrition Goals: Your personal nutrition goals will be established when you do your nutrition analysis with the dietician.  The following are general nutrition guidelines to follow: Cholesterol < 200mg /day Sodium < 1500mg /day Fiber: Men over 50 yrs - 30 grams per day  Personal Goals:  Personal Goals and Risk Factors at Admission - 09/15/20 1345      Core Components/Risk Factors/Patient Goals on Admission    Weight Management Yes;Weight Gain    Intervention Weight Management: Develop a combined nutrition and exercise program designed to reach desired caloric intake, while maintaining appropriate intake of nutrient and fiber, sodium and fats, and appropriate energy expenditure required for the weight goal.;Weight Management: Provide education and appropriate resources to help participant work on and attain dietary goals.    Admit Weight 150 lb 3.2 oz (68.1 kg)    Goal Weight: Short Term 152 lb (68.9 kg)    Goal Weight: Long Term 170 lb (77.1 kg)    Expected Outcomes Short  Term: Continue to assess and modify interventions until short term weight is achieved;Long Term: Adherence to nutrition and physical activity/exercise program aimed toward attainment of established weight goal;Weight Gain: Understanding of general recommendations for a high calorie, high protein meal plan that promotes weight gain by distributing calorie intake throughout the day with the consumption for 4-5 meals, snacks, and/or supplements    Tobacco Cessation Yes    Number of packs per day o since 07/13/2020    Intervention Assist the participant in steps to quit. Provide individualized education and counseling about committing to Tobacco Cessation, relapse prevention, and pharmacological support that can be provided by physician.;Advice worker, assist with locating and accessing local/national Quit Smoking programs, and support quit date choice.    Expected Outcomes Short Term:  Will demonstrate readiness to quit, by selecting a quit date.;Short Term: Will quit all tobacco product use, adhering to prevention of relapse plan.;Long Term: Complete abstinence from all tobacco products for at least 12 months from quit date.    Improve shortness of breath with ADL's Yes    Intervention Provide education, individualized exercise plan and daily activity instruction to help decrease symptoms of SOB with activities of daily living.    Expected Outcomes Short Term: Improve cardiorespiratory fitness to achieve a reduction of symptoms when performing ADLs;Long Term: Be able to perform more ADLs without symptoms or delay the onset of symptoms    Hypertension Yes    Intervention Provide education on lifestyle modifcations including regular physical activity/exercise, weight management, moderate sodium restriction and increased consumption of fresh fruit, vegetables, and low fat dairy, alcohol moderation, and smoking cessation.;Monitor prescription use compliance.    Expected Outcomes Short Term: Continued assessment and intervention until BP is < 140/25mm HG in hypertensive participants. < 130/24mm HG in hypertensive participants with diabetes, heart failure or chronic kidney disease.;Long Term: Maintenance of blood pressure at goal levels.    Lipids Yes    Intervention Provide education and support for participant on nutrition & aerobic/resistive exercise along with prescribed medications to achieve LDL 70mg , HDL >40mg .    Expected Outcomes Short Term: Participant states understanding of desired cholesterol values and is compliant with medications prescribed. Participant is following exercise prescription and nutrition guidelines.;Long Term: Cholesterol controlled with medications as prescribed, with individualized exercise RX and with personalized nutrition plan. Value goals: LDL < 70mg , HDL > 40 mg.           Tobacco Use Initial Evaluation: Social History   Tobacco Use  Smoking  Status Former Smoker  . Packs/day: 0.75  . Years: 67.00  . Pack years: 50.25  . Types: Cigarettes  . Start date: 59  . Quit date: 07/13/2020  . Years since quitting: 0.1  Smokeless Tobacco Never Used  Tobacco Comment   Recent Quit      Exercise Goals and Review:  Exercise Goals    Row Name 09/15/20 1342             Exercise Goals   Increase Physical Activity Yes       Intervention Provide advice, education, support and counseling about physical activity/exercise needs.;Develop an individualized exercise prescription for aerobic and resistive training based on initial evaluation findings, risk stratification, comorbidities and participant's personal goals.       Expected Outcomes Short Term: Attend rehab on a regular basis to increase amount of physical activity.;Long Term: Add in home exercise to make exercise part of routine and to increase amount of physical activity.;Long Term: Exercising regularly at least  3-5 days a week.       Increase Strength and Stamina Yes       Intervention Provide advice, education, support and counseling about physical activity/exercise needs.;Develop an individualized exercise prescription for aerobic and resistive training based on initial evaluation findings, risk stratification, comorbidities and participant's personal goals.       Expected Outcomes Short Term: Perform resistance training exercises routinely during rehab and add in resistance training at home;Short Term: Increase workloads from initial exercise prescription for resistance, speed, and METs.;Long Term: Improve cardiorespiratory fitness, muscular endurance and strength as measured by increased METs and functional capacity (6MWT)       Able to understand and use rate of perceived exertion (RPE) scale Yes       Intervention Provide education and explanation on how to use RPE scale       Expected Outcomes Short Term: Able to use RPE daily in rehab to express subjective intensity level;Long  Term:  Able to use RPE to guide intensity level when exercising independently       Able to understand and use Dyspnea scale Yes       Intervention Provide education and explanation on how to use Dyspnea scale       Expected Outcomes Short Term: Able to use Dyspnea scale daily in rehab to express subjective sense of shortness of breath during exertion;Long Term: Able to use Dyspnea scale to guide intensity level when exercising independently       Knowledge and understanding of Target Heart Rate Range (THRR) Yes       Intervention Provide education and explanation of THRR including how the numbers were predicted and where they are located for reference       Expected Outcomes Short Term: Able to state/look up THRR;Short Term: Able to use daily as guideline for intensity in rehab;Long Term: Able to use THRR to govern intensity when exercising independently       Able to check pulse independently Yes       Intervention Provide education and demonstration on how to check pulse in carotid and radial arteries.;Review the importance of being able to check your own pulse for safety during independent exercise       Expected Outcomes Short Term: Able to explain why pulse checking is important during independent exercise;Long Term: Able to check pulse independently and accurately       Understanding of Exercise Prescription Yes       Intervention Provide education, explanation, and written materials on patient's individual exercise prescription       Expected Outcomes Short Term: Able to explain program exercise prescription;Long Term: Able to explain home exercise prescription to exercise independently              Copy of goals given to participant.

## 2020-09-16 DIAGNOSIS — J9611 Chronic respiratory failure with hypoxia: Secondary | ICD-10-CM | POA: Diagnosis not present

## 2020-09-16 DIAGNOSIS — J449 Chronic obstructive pulmonary disease, unspecified: Secondary | ICD-10-CM | POA: Diagnosis not present

## 2020-09-19 ENCOUNTER — Other Ambulatory Visit: Payer: Self-pay

## 2020-09-19 DIAGNOSIS — J431 Panlobular emphysema: Secondary | ICD-10-CM

## 2020-09-19 DIAGNOSIS — Z79899 Other long term (current) drug therapy: Secondary | ICD-10-CM | POA: Diagnosis not present

## 2020-09-19 DIAGNOSIS — Z87891 Personal history of nicotine dependence: Secondary | ICD-10-CM | POA: Diagnosis not present

## 2020-09-19 NOTE — Progress Notes (Signed)
Daily Session Note  Patient Details  Name: [name] [name]. MRN: [id number] Date of Birth: Mar 12, 1941 Referring Provider:   Flowsheet Row Pulmonary Rehab from 09/15/2020 in [facility] Cardiac and Pulmonary Rehab  Referring Provider Baltazar Apo MD      Encounter Date: 09/19/2020  Check In:  Session Check In - 09/19/20 1409      Check-In   Supervising physician immediately available to respond to emergencies See telemetry face sheet for immediately available ER MD    Location [facility]-Cardiac & Pulmonary Rehab    Staff Present Birdie Sons, MPA, Mauricia Area, BS, ACSM CEP, Exercise Physiologist;Kara Eliezer Bottom, MS Exercise Physiologist;Meredith Sherryll Burger, RN BSN    Virtual Visit No    Medication changes reported     No    Fall or balance concerns reported    No    Warm-up and Cool-down Performed on first and last piece of equipment    Resistance Training Performed Yes    VAD Patient? No    PAD/SET Patient? No      Pain Assessment   Currently in Pain? No/denies              Social History   Tobacco Use  Smoking Status Former Smoker  . Packs/day: 0.75  . Years: 67.00  . Pack years: 50.25  . Types: Cigarettes  . Start date: 64  . Quit date: 07/13/2020  . Years since quitting: 0.1  Smokeless Tobacco Never Used  Tobacco Comment   Recent Quit      Goals Met:  Independence with exercise equipment Exercise tolerated well No report of cardiac concerns or symptoms Strength training completed today  Goals Unmet:  Not Applicable  Comments: First full day of exercise!  Patient was oriented to gym and equipment including functions, settings, policies, and procedures.  Patient's individual exercise prescription and treatment plan were reviewed.  All starting workloads were established based on the results of the 6 minute walk test done at initial orientation visit.  The plan for exercise progression was also introduced and progression will be customized based on patient's  performance and goals.    Dr. Emily Filbert is Medical Director for [place] and [facility].

## 2020-09-21 ENCOUNTER — Other Ambulatory Visit: Payer: Self-pay

## 2020-09-21 ENCOUNTER — Encounter: Payer: Self-pay | Admitting: *Deleted

## 2020-09-21 DIAGNOSIS — J431 Panlobular emphysema: Secondary | ICD-10-CM | POA: Diagnosis not present

## 2020-09-21 DIAGNOSIS — Z79899 Other long term (current) drug therapy: Secondary | ICD-10-CM | POA: Diagnosis not present

## 2020-09-21 DIAGNOSIS — Z87891 Personal history of nicotine dependence: Secondary | ICD-10-CM | POA: Diagnosis not present

## 2020-09-21 NOTE — Progress Notes (Signed)
Daily Session Note  Patient Details  Name: Haniel Fix. MRN: [id number] Date of Birth: 1940/12/04 Referring Provider:   Flowsheet Row Pulmonary Rehab from 09/15/2020 in [facility] Cardiac and Pulmonary Rehab  Referring Provider Baltazar Apo MD      Encounter Date: 09/21/2020  Check In:  Session Check In - 09/21/20 1403      Check-In   Supervising physician immediately available to respond to emergencies See telemetry face sheet for immediately available ER MD    Location [facility]-Cardiac & Pulmonary Rehab    Staff Present Birdie Sons, MPA, RN;Joseph Lou Miner, MS Exercise Physiologist    Virtual Visit No    Medication changes reported     No    Fall or balance concerns reported    No    Warm-up and Cool-down Performed on first and last piece of equipment    Resistance Training Performed Yes    VAD Patient? No    PAD/SET Patient? No      Pain Assessment   Currently in Pain? No/denies              Social History   Tobacco Use  Smoking Status Former Smoker  . Packs/day: 0.75  . Years: 67.00  . Pack years: 50.25  . Types: Cigarettes  . Start date: 26  . Quit date: 07/13/2020  . Years since quitting: 0.1  Smokeless Tobacco Never Used  Tobacco Comment   Recent Quit      Goals Met:  Independence with exercise equipment Exercise tolerated well No report of cardiac concerns or symptoms Strength training completed today  Goals Unmet:  Not Applicable  Comments: Pt able to follow exercise prescription today without complaint.  Will continue to monitor for progression.    Dr. Emily Filbert is Medical Director for [place] and [facility].

## 2020-09-21 NOTE — Progress Notes (Signed)
Pulmonary Individual Treatment Plan  Patient Details  Name: [name] [name]. 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  albuterol (PROVENTIL) (2.5 MG/3ML) 0.083% nebulizer solution, Take 3 mLs (2.5 mg total) by nebulization every 6 (six) hours as needed for wheezing or shortness of breath., Disp: 150 mL, Rfl: 5 .  Budeson-Glycopyrrol-Formoterol (BREZTRI AEROSPHERE) 160-9-4.8 MCG/ACT AERO, Inhale 2 puffs into the lungs 2 (two) times daily. (Patient not taking: Reported on 09/12/2020), Disp: 4.8 g, Rfl: 5 .  Budeson-Glycopyrrol-Formoterol (BREZTRI AEROSPHERE) 160-9-4.8 MCG/ACT AERO, Inhale 2 puffs into the lungs 2 (two) times daily., Disp: 10.7 g, Rfl: 0 .  cholecalciferol (VITAMIN D3) 25 MCG (1000 UNIT) tablet, Take 1,000 Units by mouth 3 (three) times a week. , Disp: , Rfl:  .  clopidogrel (PLAVIX) 75 MG tablet, Take 1 tablet (75 mg total) by mouth daily. Ok to restart this medication on Wednesday 04/13/20., Disp: 90 tablet, Rfl: 3 .  esomeprazole (NEXIUM) 20 MG capsule, Take 20 mg by mouth daily. , Disp: , Rfl:  .  Ipratropium-Albuterol (COMBIVENT RESPIMAT) 20-100 MCG/ACT AERS respimat, Inhale 1-2 puffs into the lungs every 6 (six) hours as needed for wheezing or shortness of breath., Disp: , Rfl:  .  ketoconazole (NIZORAL) 2 % cream, Apply 1 application topically daily. (Patient not taking: Reported on 09/12/2020), Disp: , Rfl:  .  Melatonin 5 MG CAPS, Take 5 mg by mouth at bedtime., Disp: , Rfl:  .  nitroGLYCERIN (NITROSTAT) 0.4 MG SL tablet, Place 1 tablet (0.4 mg total) under the tongue every 5  (five) minutes as needed for chest pain., Disp: 25 tablet, Rfl: 11 .  predniSONE (DELTASONE) 5 MG tablet, Take 5 mg by mouth daily., Disp: , Rfl:  .  rosuvastatin (CRESTOR) 20 MG tablet, TAKE 1 TABLET BY MOUTH EVERYDAY AT BEDTIME, Disp: 90 tablet, Rfl: 1 .  tamsulosin (FLOMAX) 0.4 MG CAPS capsule, 1 capsule, Disp: , Rfl:  No current facility-administered medications for this visit.  Facility-Administered Medications Ordered in Other Visits:  .  thallous chloride (201 THALLIUM) injection 4 milli Curie, 4 millicurie, Intravenous, Once PRN, Larey Dresser, MD  Past Medical History: Past Medical History:  Diagnosis Date  . Acquired trigger finger of right middle finger 02/04/2020  . Acute prostatitis 04/08/2018  . Alcohol abuse   . Anal or rectal pain 04/08/2018  . Arthritis   . Atherosclerotic heart disease of native coronary artery without angina pectoris 04/08/2018  . BPH with elevated PSA   . 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 Chronic respiratory failure with hypoxia ([place]) 08/10/2019  . Cigarette nicotine dependence, uncomplicated 01/13/8371  . Coagulation disorder ([place]) 01/13/2019  . 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 COPD (chronic obstructive pulmonary disease) ([place])   . Corns and callosities 04/09/2018  . Coronary artery disease    2019 with stents  . Coronary atherosclerosis 05/07/2018  . Diarrhea 04/09/2018  . 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 Dysphagia 08/14/2018  . Dyspnea   . ED (erectile dysfunction)   . Elevated prostate specific antigen (PSA) 04/08/2018  . Elevated  PSA   . Enlarged prostate without lower urinary tract symptoms (luts) 04/09/2018  . Essential (primary) hypertension 04/08/2018  . Ex-smoker 04/09/2018  . Exertional dyspnea 04/02/2018  . 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 Flatulence 04/08/2018  . Gastro-esophageal reflux disease without esophagitis 04/08/2018  . GERD (gastroesophageal reflux disease)   . History of acute otitis  externa 08/14/2018  . Hyperlipidemia   . Hypertension   . Kidney cysts   . Low back pain 04/08/2018  . Male erectile dysfunction 04/09/2018  . Mixed hyperlipidemia 04/08/2018  . Nocturia 04/09/2018  . Osteoarthritis of first carpometacarpal joint 04/08/2018  . Pain due to onychomycosis of toenail of left foot 07/20/2019  . Pain in joint 04/09/2018  . Pain in knee 04/09/2018  . Pain of finger 04/09/2018  . Palpitations   . Pneumonia   . Pneumothorax 04/12/2020  . Polypharmacy 04/09/2018  . Porokeratosis 01/13/2019  . Pulmonary nodules 06/08/2016  . Sleep disorder 04/08/2018  . Status post bronchoscopy 04/12/2020  . Tear of rotator cuff 04/09/2018  . Uncomplicated alcohol dependence ([place]) 04/09/2018  . Vitamin D deficiency 04/08/2018    Tobacco Use: Social History   Tobacco Use  Smoking Status Former Smoker  . Packs/day: 0.75  . Years: 67.00  . Pack years: 50.25  . Types: Cigarettes  . Start date: 85  . Quit date: 07/13/2020  . Years since quitting: 0.1  Smokeless Tobacco Never Used  Tobacco Comment   Recent Quit      Labs: Recent Review Flowsheet Data    Labs for ITP Cardiac and Pulmonary Rehab Latest Ref Rng & Units 06/06/2018   Cholestrol 100 - 199 mg/dL 122   LDLCALC 0 - 99 mg/dL 50   HDL >39 mg/dL 61   Trlycerides 0 - 149 mg/dL 54       Pulmonary Assessment Scores:  Pulmonary Assessment Scores    Row Name 09/15/20 1347         ADL UCSD   ADL Phase Entry     SOB Score total 56     Rest 0     Walk 2     Stairs 4     Bath 3     Dress 2     Shop 3           CAT Score   CAT Score 16           mMRC Score   mMRC Score 3            UCSD: Self-administered rating of dyspnea associated with activities of daily living (ADLs) 6-point scale (0 = "not at all" to 5 = "maximal or unable to do because of breathlessness")  Scoring Scores range from 0 to 120.  Minimally important difference is 5 units  CAT: CAT can identify the health impairment of COPD patients  and is better correlated with disease progression.  CAT has a scoring range of zero to 40. The CAT score is classified into four groups of low (less than 10), medium (10 - 20), high (21-30) and very high (31-40) based on the impact level of disease on health status. A CAT score over 10 suggests significant symptoms.  A worsening CAT score could be explained by an exacerbation, poor medication adherence, poor inhaler technique, or progression of COPD or comorbid conditions.  CAT MCID is 2 points  mMRC: mMRC (Modified Medical Research Council) Dyspnea Scale is used to assess the degree of baseline functional disability in patients of respiratory  disease due to dyspnea. No minimal important difference is established. A decrease in score of 1 point or greater is considered a positive change.   Pulmonary Function Assessment:  Pulmonary Function Assessment - 09/15/20 1347      Breath   Shortness of Breath Yes;Fear of Shortness of Breath;Limiting activity           Exercise Target Goals: Exercise Program Goal: Individual exercise prescription set using results from initial 6 min walk test and THRR while considering  patient's activity barriers and safety.   Exercise Prescription Goal: Initial exercise prescription builds to 30-45 minutes a day of aerobic activity, 2-3 days per week.  Home exercise guidelines will be given to patient during program as part of exercise prescription that the participant will acknowledge.  Education: Aerobic Exercise: - Group verbal and visual presentation on the components of exercise prescription. Introduces F.I.T.T principle from ACSM for exercise prescriptions.  Reviews F.I.T.T. principles of aerobic exercise including progression. Written material given at graduation.   Education: Resistance Exercise: - Group verbal and visual presentation on the components of exercise prescription. Introduces F.I.T.T principle from ACSM for exercise prescriptions  Reviews  F.I.T.T. principles of resistance exercise including progression. Written material given at graduation.    Education: Exercise & Equipment Safety: - Individual verbal instruction and demonstration of equipment use and safety with use of the equipment. Flowsheet Row Pulmonary Rehab from 09/15/2020 in [facility] Cardiac and Pulmonary Rehab  Date 09/15/20  Educator [facility]  Instruction Review Code 1- Verbalizes Understanding      Education: Exercise Physiology & General Exercise Guidelines: - Group verbal and written instruction with models to review the exercise physiology of the cardiovascular system and associated critical values. Provides general exercise guidelines with specific guidelines to those with heart or lung disease.    Education: Flexibility, Balance, Mind/Body Relaxation: - Group verbal and visual presentation with interactive activity on the components of exercise prescription. Introduces F.I.T.T principle from ACSM for exercise prescriptions. Reviews F.I.T.T. principles of flexibility and balance exercise training including progression. Also discusses the mind body connection.  Reviews various relaxation techniques to help reduce and manage stress (i.e. Deep breathing, progressive muscle relaxation, and visualization). Balance handout provided to take home. Written material given at graduation.   Activity Barriers & Risk Stratification:  Activity Barriers & Cardiac Risk Stratification - 09/15/20 1339      Activity Barriers & Cardiac Risk Stratification   Activity Barriers Shortness of Breath;Balance Concerns;Other (comment);Deconditioning;Muscular Weakness    Comments minimizes walking because of SOB.           6 Minute Walk:  6 Minute Walk    Row Name 09/15/20 1336         6 Minute Walk   Phase Initial     Distance 580 feet     Walk Time 4.18 minutes     # of Rest Breaks 2  16 sec, 1:33     MPH 1.58     METS 1.74     RPE 15     Perceived Dyspnea  3     VO2 Peak 6.04      Symptoms Yes (comment)     Comments SOB, leg fatigue     Resting HR 85 bpm     Resting BP 126/64     Resting Oxygen Saturation  99 %     Exercise Oxygen Saturation  during 6 min walk 92 %     Max Ex. HR 98 bpm  Max Ex. BP 156/74     2 Minute Post BP 136/74           Interval HR   1 Minute HR 85     2 Minute HR 82     3 Minute HR 90     4 Minute HR 94     5 Minute HR 98     6 Minute HR 83     2 Minute Post HR 94     Interval Heart Rate? Yes           Interval Oxygen   Interval Oxygen? Yes     Baseline Oxygen Saturation % 99 %     1 Minute Oxygen Saturation % 95 %     1 Minute Liters of Oxygen 4 L  pulsed     2 Minute Oxygen Saturation % 94 %     2 Minute Liters of Oxygen 4 L     3 Minute Oxygen Saturation % 91 %     3 Minute Liters of Oxygen 5 L     4 Minute Oxygen Saturation % 92 %  seated     4 Minute Liters of Oxygen 5 L     5 Minute Oxygen Saturation % 93 %     5 Minute Liters of Oxygen 5 L     6 Minute Oxygen Saturation % 92 %     6 Minute Liters of Oxygen 5 L     2 Minute Post Oxygen Saturation % 96 %     2 Minute Post Liters of Oxygen 4 L           Oxygen Initial Assessment:  Oxygen Initial Assessment - 09/15/20 1346      Home Oxygen   Home Oxygen Device Portable Concentrator    Sleep Oxygen Prescription Pulsed    Liters per minute 2    Home Exercise Oxygen Prescription Pulsed    Liters per minute 4    Home Resting Oxygen Prescription None    Compliance with Home Oxygen Use Yes      Initial 6 min Walk   Oxygen Used Pulsed;Portable Concentrator    Liters per minute 4      Program Oxygen Prescription   Program Oxygen Prescription Continuous;E-Tanks    Liters per minute 4      Intervention   Short Term Goals To learn and exhibit compliance with exercise, home and travel O2 prescription;To learn and understand importance of monitoring SPO2 with pulse oximeter and demonstrate accurate use of the pulse oximeter.;To learn and understand importance  of maintaining oxygen saturations>88%;To learn and demonstrate proper pursed lip breathing techniques or other breathing techniques.;To learn and demonstrate proper use of respiratory medications    Long  Term Goals Exhibits compliance with exercise, home and travel O2 prescription;Verbalizes importance of monitoring SPO2 with pulse oximeter and return demonstration;Maintenance of O2 saturations>88%;Exhibits proper breathing techniques, such as pursed lip breathing or other method taught during program session;Compliance with respiratory medication;Demonstrates proper use of MDI's           Oxygen Re-Evaluation:  Oxygen Re-Evaluation    Row Name 09/19/20 1411             Program Oxygen Prescription   Program Oxygen Prescription Continuous;E-Tanks       Liters per minute 4               Home Oxygen   Home Oxygen Device Portable Concentrator  Sleep Oxygen Prescription Pulsed       Liters per minute 2       Home Exercise Oxygen Prescription Pulsed       Liters per minute 4       Home Resting Oxygen Prescription None       Compliance with Home Oxygen Use Yes               Goals/Expected Outcomes   Short Term Goals To learn and exhibit compliance with exercise, home and travel O2 prescription;To learn and understand importance of monitoring SPO2 with pulse oximeter and demonstrate accurate use of the pulse oximeter.;To learn and understand importance of maintaining oxygen saturations>88%;To learn and demonstrate proper pursed lip breathing techniques or other breathing techniques.       Long  Term Goals Exhibits compliance with exercise, home and travel O2 prescription;Verbalizes importance of monitoring SPO2 with pulse oximeter and return demonstration;Maintenance of O2 saturations>88%;Exhibits proper breathing techniques, such as pursed lip breathing or other method taught during program session       Comments Reviewed PLB technique with pt.  Talked about how it works and it's  importance in maintaining their exercise saturations.       Goals/Expected Outcomes Short: Become more profiecient at using PLB.   Long: Become independent at using PLB.              Oxygen Discharge (Final Oxygen Re-Evaluation):  Oxygen Re-Evaluation - 09/19/20 1411      Program Oxygen Prescription   Program Oxygen Prescription Continuous;E-Tanks    Liters per minute 4      Home Oxygen   Home Oxygen Device Portable Concentrator    Sleep Oxygen Prescription Pulsed    Liters per minute 2    Home Exercise Oxygen Prescription Pulsed    Liters per minute 4    Home Resting Oxygen Prescription None    Compliance with Home Oxygen Use Yes      Goals/Expected Outcomes   Short Term Goals To learn and exhibit compliance with exercise, home and travel O2 prescription;To learn and understand importance of monitoring SPO2 with pulse oximeter and demonstrate accurate use of the pulse oximeter.;To learn and understand importance of maintaining oxygen saturations>88%;To learn and demonstrate proper pursed lip breathing techniques or other breathing techniques.    Long  Term Goals Exhibits compliance with exercise, home and travel O2 prescription;Verbalizes importance of monitoring SPO2 with pulse oximeter and return demonstration;Maintenance of O2 saturations>88%;Exhibits proper breathing techniques, such as pursed lip breathing or other method taught during program session    Comments Reviewed PLB technique with pt.  Talked about how it works and it's importance in maintaining their exercise saturations.    Goals/Expected Outcomes Short: Become more profiecient at using PLB.   Long: Become independent at using PLB.           Initial Exercise Prescription:  Initial Exercise Prescription - 09/15/20 1300      Date of Initial Exercise RX and Referring Provider   Date 09/15/20    Referring Provider Baltazar Apo MD      Oxygen   Oxygen Continuous    Liters 4      Treadmill   MPH 1.3     Grade 0.5    Minutes 15    METs 2.09      Recumbant Bike   Level 1    RPM 50    Watts 6    Minutes 15    METs 2  Arm Ergometer   Level 1    Watts 18    RPM 25    Minutes 15    METs 2      REL-XR   Level 1    Speed 50    Minutes 15    METs 2      Prescription Details   Frequency (times per week) 3    Duration Progress to 30 minutes of continuous aerobic without signs/symptoms of physical distress      Intensity   THRR 40-80% of Max Heartrate 107-130    Ratings of Perceived Exertion 11-13    Perceived Dyspnea 0-4      Progression   Progression Continue to progress workloads to maintain intensity without signs/symptoms of physical distress.      Resistance Training   Training Prescription Yes    Weight 3 lb    Reps 10-15           Perform Capillary Blood Glucose checks as needed.  Exercise Prescription Changes:  Exercise Prescription Changes    Row Name 09/15/20 1300             Response to Exercise   Blood Pressure (Admit) 126/64       Blood Pressure (Exercise) 156/74       Blood Pressure (Exit) 122/68       Heart Rate (Admit) 85 bpm       Heart Rate (Exercise) 98 bpm       Heart Rate (Exit) 87 bpm       Oxygen Saturation (Admit) 99 %       Oxygen Saturation (Exercise) 91 %       Oxygen Saturation (Exit) 99 %       Rating of Perceived Exertion (Exercise) 15       Perceived Dyspnea (Exercise) 3       Symptoms SOB, leg fatigue       Comments walk test results              Exercise Comments:  Exercise Comments    Row Name 09/19/20 1410           Exercise Comments First full day of exercise!  Patient was oriented to gym and equipment including functions, settings, policies, and procedures.  Patient's individual exercise prescription and treatment plan were reviewed.  All starting workloads were established based on the results of the 6 minute walk test done at initial orientation visit.  The plan for exercise progression was also  introduced and progression will be customized based on patient's performance and goals.              Exercise Goals and Review:  Exercise Goals    Row Name 09/15/20 1342             Exercise Goals   Increase Physical Activity Yes       Intervention Provide advice, education, support and counseling about physical activity/exercise needs.;Develop an individualized exercise prescription for aerobic and resistive training based on initial evaluation findings, risk stratification, comorbidities and participant's personal goals.       Expected Outcomes Short Term: Attend rehab on a regular basis to increase amount of physical activity.;Long Term: Add in home exercise to make exercise part of routine and to increase amount of physical activity.;Long Term: Exercising regularly at least 3-5 days a week.       Increase Strength and Stamina Yes       Intervention Provide advice, education, support and counseling  about physical activity/exercise needs.;Develop an individualized exercise prescription for aerobic and resistive training based on initial evaluation findings, risk stratification, comorbidities and participant's personal goals.       Expected Outcomes Short Term: Perform resistance training exercises routinely during rehab and add in resistance training at home;Short Term: Increase workloads from initial exercise prescription for resistance, speed, and METs.;Long Term: Improve cardiorespiratory fitness, muscular endurance and strength as measured by increased METs and functional capacity (6MWT)       Able to understand and use rate of perceived exertion (RPE) scale Yes       Intervention Provide education and explanation on how to use RPE scale       Expected Outcomes Short Term: Able to use RPE daily in rehab to express subjective intensity level;Long Term:  Able to use RPE to guide intensity level when exercising independently       Able to understand and use Dyspnea scale Yes        Intervention Provide education and explanation on how to use Dyspnea scale       Expected Outcomes Short Term: Able to use Dyspnea scale daily in rehab to express subjective sense of shortness of breath during exertion;Long Term: Able to use Dyspnea scale to guide intensity level when exercising independently       Knowledge and understanding of Target Heart Rate Range (THRR) Yes       Intervention Provide education and explanation of THRR including how the numbers were predicted and where they are located for reference       Expected Outcomes Short Term: Able to state/look up THRR;Short Term: Able to use daily as guideline for intensity in rehab;Long Term: Able to use THRR to govern intensity when exercising independently       Able to check pulse independently Yes       Intervention Provide education and demonstration on how to check pulse in carotid and radial arteries.;Review the importance of being able to check your own pulse for safety during independent exercise       Expected Outcomes Short Term: Able to explain why pulse checking is important during independent exercise;Long Term: Able to check pulse independently and accurately       Understanding of Exercise Prescription Yes       Intervention Provide education, explanation, and written materials on patient's individual exercise prescription       Expected Outcomes Short Term: Able to explain program exercise prescription;Long Term: Able to explain home exercise prescription to exercise independently              Exercise Goals Re-Evaluation :  Exercise Goals Re-Evaluation    Row Name 09/19/20 1410             Exercise Goal Re-Evaluation   Exercise Goals Review Increase Physical Activity;Able to understand and use rate of perceived exertion (RPE) scale;Knowledge and understanding of Target Heart Rate Range (THRR);Understanding of Exercise Prescription;Increase Strength and Stamina;Able to understand and use Dyspnea scale;Able to  check pulse independently       Comments Reviewed RPE and dyspnea scales, THR and program prescription with pt today.  Pt voiced understanding and was given a copy of goals to take home.       Expected Outcomes Short: Use RPE daily to regulate intensity. Long: Follow program prescription in THR.              Discharge Exercise Prescription (Final Exercise Prescription Changes):  Exercise Prescription Changes - 09/15/20 1300  Response to Exercise   Blood Pressure (Admit) 126/64    Blood Pressure (Exercise) 156/74    Blood Pressure (Exit) 122/68    Heart Rate (Admit) 85 bpm    Heart Rate (Exercise) 98 bpm    Heart Rate (Exit) 87 bpm    Oxygen Saturation (Admit) 99 %    Oxygen Saturation (Exercise) 91 %    Oxygen Saturation (Exit) 99 %    Rating of Perceived Exertion (Exercise) 15    Perceived Dyspnea (Exercise) 3    Symptoms SOB, leg fatigue    Comments walk test results           Nutrition:  Target Goals: Understanding of nutrition guidelines, daily intake of sodium 1500mg , cholesterol 200mg , calories 30% from fat and 7% or less from saturated fats, daily to have 5 or more servings of fruits and vegetables.  Education: All About Nutrition: -Group instruction provided by verbal, written material, interactive activities, discussions, models, and posters to present general guidelines for heart healthy nutrition including fat, fiber, MyPlate, the role of sodium in heart healthy nutrition, utilization of the nutrition label, and utilization of this knowledge for meal planning. Follow up email sent as well. Written material given at graduation.   Biometrics:  Pre Biometrics - 09/15/20 1343      Pre Biometrics   Height 5' 9.75" (1.772 m)    Weight 150 lb 3.2 oz (68.1 kg)    BMI (Calculated) 21.7    Single Leg Stand 2.9 seconds            Nutrition Therapy Plan and Nutrition Goals:   Nutrition Assessments:  MEDIFICTS Score Key:  ?70 Need to make dietary changes    40-70 Heart Healthy Diet  ? 40 Therapeutic Level Cholesterol Diet  Flowsheet Row Pulmonary Rehab from 09/15/2020 in [facility] Cardiac and Pulmonary Rehab  Picture Your Plate Total Score on Admission 54     Picture Your Plate Scores:  <01 Unhealthy dietary pattern with much room for improvement.  41-50 Dietary pattern unlikely to meet recommendations for good health and room for improvement.  51-60 More healthful dietary pattern, with some room for improvement.   >60 Healthy dietary pattern, although there may be some specific behaviors that could be improved.   Nutrition Goals Re-Evaluation:   Nutrition Goals Discharge (Final Nutrition Goals Re-Evaluation):   Psychosocial: Target Goals: Acknowledge presence or absence of significant depression and/or stress, maximize coping skills, provide positive support system. Participant is able to verbalize types and ability to use techniques and skills needed for reducing stress and depression.   Education: Stress, Anxiety, and Depression - Group verbal and visual presentation to define topics covered.  Reviews how body is impacted by stress, anxiety, and depression.  Also discusses healthy ways to reduce stress and to treat/manage anxiety and depression.  Written material given at graduation.   Education: Sleep Hygiene -Provides group verbal and written instruction about how sleep can affect your health.  Define sleep hygiene, discuss sleep cycles and impact of sleep habits. Review good sleep hygiene tips.    Initial Review & Psychosocial Screening:  Initial Psych Review & Screening - 09/12/20 1136      Barriers   Psychosocial barriers to participate in program There are no identifiable barriers or psychosocial needs.      Screening Interventions   Interventions Encouraged to exercise;To provide support and resources with identified psychosocial needs;Provide feedback about the scores to participant           Quality  of Life  Scores:  Scores of 19 and below usually indicate a poorer quality of life in these areas.  A difference of  2-3 points is a clinically meaningful difference.  A difference of 2-3 points in the total score of the Quality of Life Index has been associated with significant improvement in overall quality of life, self-image, physical symptoms, and general health in studies assessing change in quality of life.  PHQ-9: Recent Review Flowsheet Data    Depression screen [facility] 2/9 09/15/2020   Decreased Interest 0   Down, Depressed, Hopeless 0   PHQ - 2 Score 0   Altered sleeping 0   Tired, decreased energy 0   Change in appetite 0   Feeling bad or failure about yourself  0   Trouble concentrating 0   Moving slowly or fidgety/restless 0   Suicidal thoughts 0   PHQ-9 Score 0   Difficult doing work/chores Not difficult at all     Interpretation of Total Score  Total Score Depression Severity:  1-4 = Minimal depression, 5-9 = Mild depression, 10-14 = Moderate depression, 15-19 = Moderately severe depression, 20-27 = Severe depression   Psychosocial Evaluation and Intervention:  Psychosocial Evaluation - 09/12/20 1137      Psychosocial Evaluation & Interventions   Interventions Encouraged to exercise with the program and follow exercise prescription    Comments Geremiah has no barriers to entering the program. He lives in a [place] at [place] with his wife and their dog. THey had moved to [facility] last year from [facility]. He keeps busy with social activities- a golf group on Sundays which is golf and then a meal. He has not been able to play golf for the past 4-5 months as the course has restrictions on how close a cart can get to the green and his shortness of breath limits his ability to play with the retsitctions.  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 He stays active planning golf outings out of town at least 2 times a year.  He does not usually walk  the dog , as his wife does that. He does take to dog to their dog pasrk at [facility] and to the dog park in [place]. He does have a portable oxygen concentrator that he will use if needed during the day. Walking to the car and then off when driving is an example. He quit tobacco in Dec 2021 and feels confident he will not resume tobacco use. He lives in a smoke free environment. His shortness of breath is limintg to him. He is hoping to see improvement to enable him to participate more in his social activities. He is concerned that he is losing too much weight. He is around 150 pounds from 175 pounds last year. He did find an article that speaks to the calori needs of a patient with COPD and is interested in learning more so he can gain back some weight and stop weight loss.    Expected Outcomes STG: Orvin attends all scheduled appointments, he remains tobacco free. He meets with RD about his weight concerns.  LTG: Able to maintian tobacco cessation, has learned about maintianing his weight, ways to improve his SOB and is able to use the tools for improve quality of life           Psychosocial Re-Evaluation:   Psychosocial Discharge (Final Psychosocial Re-Evaluation):   Education: Education Goals: Education  classes will be provided on a weekly basis, covering required topics. Participant will state understanding/return demonstration of topics presented.  Learning Barriers/Preferences:  Learning Barriers/Preferences - 09/12/20 1124      Learning Barriers/Preferences   Learning Barriers None    Learning Preferences None           General Pulmonary Education Topics:  Infection Prevention: - Provides verbal and written material to individual with discussion of infection control including proper hand washing and proper equipment cleaning during exercise session. Flowsheet Row Pulmonary Rehab from 09/15/2020 in [facility] Cardiac and Pulmonary Rehab  Date 09/15/20  Educator [facility]  Instruction Review  Code 1- Verbalizes Understanding      Falls Prevention: - Provides verbal and written material to individual with discussion of falls prevention and safety. Flowsheet Row Pulmonary Rehab from 09/15/2020 in [facility] Cardiac and Pulmonary Rehab  Date 09/12/20  Educator SB  Instruction Review Code 1- Verbalizes Understanding      Chronic Lung Disease Review: - Group verbal instruction with posters, models, PowerPoint presentations and videos,  to review new updates, new respiratory medications, new advancements in procedures and treatments. Providing information on websites and "800" numbers for continued self-education. Includes information about supplement oxygen, available portable oxygen systems, continuous and intermittent flow rates, oxygen safety, concentrators, and Medicare reimbursement for oxygen. Explanation of Pulmonary Drugs, including class, frequency, complications, importance of spacers, rinsing mouth after steroid MDI's, and proper cleaning methods for nebulizers. Review of basic lung anatomy and physiology related to function, structure, and complications of lung disease. Review of risk factors. Discussion about methods for diagnosing sleep apnea and types of masks and machines for OSA. Includes a review of the use of types of environmental controls: home humidity, furnaces, filters, dust mite/pet prevention, HEPA vacuums. Discussion about weather changes, air quality and the benefits of nasal washing. Instruction on Warning signs, infection symptoms, calling MD promptly, preventive modes, and value of vaccinations. Review of effective airway clearance, coughing and/or vibration techniques. Emphasizing that all should Create an Action Plan. Written material given at graduation. Flowsheet Row Pulmonary Rehab from 09/15/2020 in [facility] Cardiac and Pulmonary Rehab  Education need identified 09/15/20      AED/CPR: - Group verbal and written instruction with the use of models to demonstrate the  basic use of the AED with the basic ABC's of resuscitation.    Anatomy and Cardiac Procedures: - Group verbal and visual presentation and models provide information about basic cardiac anatomy and function. Reviews the testing methods done to diagnose heart disease and the outcomes of the test results. Describes the treatment choices: Medical Management, Angioplasty, or Coronary Bypass Surgery for treating various heart conditions including Myocardial Infarction, Angina, Valve Disease, and Cardiac Arrhythmias.  Written material given at graduation.   Medication Safety: - Group verbal and visual instruction to review commonly prescribed medications for heart and lung disease. Reviews the medication, class of the drug, and side effects. Includes the steps to properly store meds and maintain the prescription regimen.  Written material given at graduation.   Other: -Provides group and verbal instruction on various topics (see comments)   Knowledge Questionnaire Score:  Knowledge Questionnaire Score - 09/15/20 1344      Knowledge Questionnaire Score   Pre Score 16/18 Education Focus: O2 safety            Core Components/Risk Factors/Patient Goals at Admission:  Personal Goals and Risk Factors at Admission - 09/15/20 1345      Core Components/Risk Factors/Patient Goals on Admission  Weight Management Yes;Weight Gain    Intervention Weight Management: Develop a combined nutrition and exercise program designed to reach desired caloric intake, while maintaining appropriate intake of nutrient and fiber, sodium and fats, and appropriate energy expenditure required for the weight goal.;Weight Management: Provide education and appropriate resources to help participant work on and attain dietary goals.    Admit Weight 150 lb 3.2 oz (68.1 kg)    Goal Weight: Short Term 152 lb (68.9 kg)    Goal Weight: Long Term 170 lb (77.1 kg)    Expected Outcomes Short Term: Continue to assess and modify  interventions until short term weight is achieved;Long Term: Adherence to nutrition and physical activity/exercise program aimed toward attainment of established weight goal;Weight Gain: Understanding of general recommendations for a high calorie, high protein meal plan that promotes weight gain by distributing calorie intake throughout the day with the consumption for 4-5 meals, snacks, and/or supplements    Tobacco Cessation Yes    Number of packs per day o since 07/13/2020    Intervention Assist the participant in steps to quit. Provide individualized education and counseling about committing to Tobacco Cessation, relapse prevention, and pharmacological support that can be provided by physician.;Advice worker, assist with locating and accessing local/national Quit Smoking programs, and support quit date choice.    Expected Outcomes Short Term: Will demonstrate readiness to quit, by selecting a quit date.;Short Term: Will quit all tobacco product use, adhering to prevention of relapse plan.;Long Term: Complete abstinence from all tobacco products for at least 12 months from quit date.    Improve shortness of breath with ADL's Yes    Intervention Provide education, individualized exercise plan and daily activity instruction to help decrease symptoms of SOB with activities of daily living.    Expected Outcomes Short Term: Improve cardiorespiratory fitness to achieve a reduction of symptoms when performing ADLs;Long Term: Be able to perform more ADLs without symptoms or delay the onset of symptoms    Hypertension Yes    Intervention Provide education on lifestyle modifcations including regular physical activity/exercise, weight management, moderate sodium restriction and increased consumption of fresh fruit, vegetables, and low fat dairy, alcohol moderation, and smoking cessation.;Monitor prescription use compliance.    Expected Outcomes Short Term: Continued assessment and intervention  until BP is < 140/74mm HG in hypertensive participants. < 130/22mm HG in hypertensive participants with diabetes, heart failure or chronic kidney disease.;Long Term: Maintenance of blood pressure at goal levels.    Lipids Yes    Intervention Provide education and support for participant on nutrition & aerobic/resistive exercise along with prescribed medications to achieve LDL 70mg , HDL >40mg .    Expected Outcomes Short Term: Participant states understanding of desired cholesterol values and is compliant with medications prescribed. Participant is following exercise prescription and nutrition guidelines.;Long Term: Cholesterol controlled with medications as prescribed, with individualized exercise RX and with personalized nutrition plan. Value goals: LDL < 70mg , HDL > 40 mg.           Education:Diabetes - Individual verbal and written instruction to review signs/symptoms of diabetes, desired ranges of glucose level fasting, after meals and with exercise. Acknowledge that pre and post exercise glucose checks will be done for 3 sessions at entry of program.   Know Your Numbers and Heart Failure: - Group verbal and visual instruction to discuss disease risk factors for cardiac and pulmonary disease and treatment options.  Reviews associated critical values for Overweight/Obesity, Hypertension, Cholesterol, and Diabetes.  Discusses basics of heart failure:  signs/symptoms and treatments.  Introduces Heart Failure Zone chart for action plan for heart failure.  Written material given at graduation.   Core Components/Risk Factors/Patient Goals Review:    Core Components/Risk Factors/Patient Goals at Discharge (Final Review):    ITP Comments:  ITP Comments    Row Name 09/12/20 1152 09/15/20 1336 09/19/20 1410 09/21/20 0658     ITP Comments Virtual orientation call completed today. he has an appointment on Date: 09/15/2020 for EP eval and gym Orientation.  Documentation of diagnosis [facility] in  [facility]  Date: 05/31/2020. Completed 6MWT and gym orientation. Initial ITP created and sent for review to Dr. Emily Filbert, Medical Director. First full day of exercise!  Patient was oriented to gym and equipment including functions, settings, policies, and procedures.  Patient's individual exercise prescription and treatment plan were reviewed.  All starting workloads were established based on the results of the 6 minute walk test done at initial orientation visit.  The plan for exercise progression was also introduced and progression will be customized based on patient's performance and goals. 30 Day review completed. Medical Director ITP review done, changes made as directed, and signed approval by Medical Director.  New to program           Comments:

## 2020-09-22 ENCOUNTER — Encounter: Payer: Medicare Other | Admitting: *Deleted

## 2020-09-22 ENCOUNTER — Other Ambulatory Visit: Payer: Self-pay

## 2020-09-22 DIAGNOSIS — J431 Panlobular emphysema: Secondary | ICD-10-CM

## 2020-09-22 DIAGNOSIS — Z87891 Personal history of nicotine dependence: Secondary | ICD-10-CM | POA: Diagnosis not present

## 2020-09-22 DIAGNOSIS — Z79899 Other long term (current) drug therapy: Secondary | ICD-10-CM | POA: Diagnosis not present

## 2020-09-22 NOTE — Progress Notes (Signed)
Daily Session Note  Patient Details  Name: [name] [name]. MRN: [id number] Date of Birth: [birth year] Referring Provider:   Flowsheet Row Pulmonary Rehab from 09/15/2020 in [facility] Cardiac and Pulmonary Rehab  Referring Provider Baltazar Apo MD      Encounter Date: 09/22/2020  Check In:  Session Check In - 09/22/20 1356      Check-In   Supervising physician immediately available to respond to emergencies See telemetry face sheet for immediately available ER MD    Location [facility]-Cardiac & Pulmonary Rehab    Staff Present Renita Papa, RN BSN;Joseph [address] [place], [place], RCEP, CCRP, CCET    Virtual Visit No    Medication changes reported     No    Fall or balance concerns reported    No    Warm-up and Cool-down Performed on first and last piece of equipment    Resistance Training Performed Yes    VAD Patient? No    PAD/SET Patient? No      Pain Assessment   Currently in Pain? No/denies              Social History   Tobacco Use  Smoking Status Former Smoker  . Packs/day: 0.75  . Years: 67.00  . Pack years: 50.25  . Types: Cigarettes  . Start date: 25  . Quit date: 07/13/2020  . Years since quitting: 0.1  Smokeless Tobacco Never Used  Tobacco Comment   Recent Quit      Goals Met:  Independence with exercise equipment Exercise tolerated well No report of cardiac concerns or symptoms Strength training completed today  Goals Unmet:  Not Applicable  Comments: Pt able to follow exercise prescription today without complaint.  Will continue to monitor for progression.    Dr. Emily Filbert is Medical Director for [place] and [facility].

## 2020-09-23 ENCOUNTER — Ambulatory Visit: Payer: Medicare Other | Admitting: Podiatry

## 2020-09-26 ENCOUNTER — Encounter: Payer: Self-pay | Admitting: Podiatry

## 2020-09-26 ENCOUNTER — Other Ambulatory Visit: Payer: Self-pay

## 2020-09-26 ENCOUNTER — Ambulatory Visit: Payer: Medicare Other | Admitting: Podiatry

## 2020-09-26 DIAGNOSIS — Q828 Other specified congenital malformations of skin: Secondary | ICD-10-CM | POA: Diagnosis not present

## 2020-09-26 DIAGNOSIS — D689 Coagulation defect, unspecified: Secondary | ICD-10-CM

## 2020-09-26 DIAGNOSIS — Z79899 Other long term (current) drug therapy: Secondary | ICD-10-CM | POA: Diagnosis not present

## 2020-09-26 DIAGNOSIS — Z87891 Personal history of nicotine dependence: Secondary | ICD-10-CM | POA: Diagnosis not present

## 2020-09-26 DIAGNOSIS — J431 Panlobular emphysema: Secondary | ICD-10-CM | POA: Diagnosis not present

## 2020-09-26 NOTE — Progress Notes (Signed)
Daily Session Note  Patient Details  Name: Jabriel Vanduyne. MRN: [id number] Date of Birth: 09-12-1940 Referring Provider:   Flowsheet Row Pulmonary Rehab from 09/15/2020 in [facility] Cardiac and Pulmonary Rehab  Referring Provider Baltazar Apo MD      Encounter Date: 09/26/2020  Check In:  Session Check In - 09/26/20 1403      Check-In   Supervising physician immediately available to respond to emergencies See telemetry face sheet for immediately available ER MD    Location [facility]-Cardiac & Pulmonary Rehab    Staff Present Birdie Sons, MPA, Mauricia Area, BS, ACSM CEP, Exercise Physiologist;Kara Eliezer Bottom, MS Exercise Physiologist    Virtual Visit No    Medication changes reported     No    Fall or balance concerns reported    No    Warm-up and Cool-down Performed on first and last piece of equipment    Resistance Training Performed Yes    VAD Patient? No    PAD/SET Patient? No      Pain Assessment   Currently in Pain? No/denies              Social History   Tobacco Use  Smoking Status Former Smoker  . Packs/day: 0.75  . Years: 67.00  . Pack years: 50.25  . Types: Cigarettes  . Start date: 30  . Quit date: 07/13/2020  . Years since quitting: 0.2  Smokeless Tobacco Never Used  Tobacco Comment   Recent Quit      Goals Met:  Independence with exercise equipment Exercise tolerated well No report of cardiac concerns or symptoms Strength training completed today  Goals Unmet:  Not Applicable  Comments: Pt able to follow exercise prescription today without complaint.  Will continue to monitor for progression.    Dr. Emily Filbert is Medical Director for [place] and [facility].

## 2020-09-26 NOTE — Progress Notes (Signed)
This patient returns to my office for at risk foot care.  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 Patient is taking plavix. Patient develops a painful callus under left hallux.  This patient presents for at risk foot care today.    General Appearance  Alert, conversant and in no acute stress.  Vascular  Dorsalis pedis and posterior tibial  pulses are palpable  bilaterally.  Capillary return is within normal limits  bilaterally. Temperature is within normal limits  bilaterally.  Neurologic  Senn-Weinstein monofilament wire test within normal limits  bilaterally. Muscle power within normal limits bilaterally.  Nails Normal nails  Noted  B/L.  No evidence of bacterial infection or drainage bilaterally.  Orthopedic  No limitations of motion  feet .  No crepitus or effusions noted.  No bony pathology or digital deformities noted.HAV  B/L.  Skin  normotropic skin with no porokeratosis noted bilaterally.  No signs of infections or ulcers noted.   Callus plantar aspect left hallux.  Porokeratosis left foot   Consent was obtained for treatment procedures.  Debridement of callus with # 15 blade.  Patient injured his right leg and was given additional bandage by the nurse.   Return office visit   prn                  Told patient to return for periodic foot care and evaluation due to potential at risk complications.   Gardiner Barefoot DPM

## 2020-09-28 ENCOUNTER — Other Ambulatory Visit: Payer: Self-pay

## 2020-09-28 ENCOUNTER — Encounter: Payer: Medicare Other | Attending: Emergency Medicine

## 2020-09-28 DIAGNOSIS — J431 Panlobular emphysema: Secondary | ICD-10-CM | POA: Insufficient documentation

## 2020-09-28 NOTE — Progress Notes (Signed)
Daily Session Note  Patient Details  Name: [name] [name]. MRN: [id number] Date of Birth: [birth year] Referring Provider:   Flowsheet Row Pulmonary Rehab from 09/15/2020 in [facility] Cardiac and Pulmonary Rehab  Referring Provider Baltazar Apo MD      Encounter Date: 09/28/2020  Check In:  Session Check In - 09/28/20 1425      Check-In   Supervising physician immediately available to respond to emergencies See telemetry face sheet for immediately available ER MD    Location [facility]-Cardiac & Pulmonary Rehab    Staff Present Birdie Sons, MPA, RN;Joseph Lou Miner, MS Exercise Physiologist    Virtual Visit No    Medication changes reported     No    Fall or balance concerns reported    No    Warm-up and Cool-down Performed on first and last piece of equipment    Resistance Training Performed Yes    VAD Patient? No    PAD/SET Patient? No      Pain Assessment   Currently in Pain? No/denies              Social History   Tobacco Use  Smoking Status Former Smoker  . Packs/day: 0.75  . Years: 67.00  . Pack years: 50.25  . Types: Cigarettes  . Start date: 88  . Quit date: 07/13/2020  . Years since quitting: 0.2  Smokeless Tobacco Never Used  Tobacco Comment   Recent Quit      Goals Met:  Independence with exercise equipment Exercise tolerated well No report of cardiac concerns or symptoms Strength training completed today  Goals Unmet:  Not Applicable  Comments: Pt able to follow exercise prescription today without complaint.  Will continue to monitor for progression.    Dr. Emily Filbert is Medical Director for [place] and [facility].

## 2020-09-29 ENCOUNTER — Encounter: Payer: Medicare Other | Admitting: *Deleted

## 2020-09-29 ENCOUNTER — Other Ambulatory Visit: Payer: Self-pay

## 2020-09-29 DIAGNOSIS — J431 Panlobular emphysema: Secondary | ICD-10-CM | POA: Diagnosis not present

## 2020-09-29 DIAGNOSIS — S81812A Laceration without foreign body, left lower leg, initial encounter: Secondary | ICD-10-CM | POA: Diagnosis not present

## 2020-09-29 NOTE — Progress Notes (Signed)
Daily Session Note  Patient Details  Name: Jemuel Laursen. MRN: [id number] Date of Birth: [birth year] Referring Provider:   Flowsheet Row Pulmonary Rehab from 09/15/2020 in [facility] Cardiac and Pulmonary Rehab  Referring Provider Baltazar Apo MD      Encounter Date: 09/29/2020  Check In:  Session Check In - 09/29/20 1358      Check-In   Supervising physician immediately available to respond to emergencies See telemetry face sheet for immediately available ER MD    Location [facility]-Cardiac & Pulmonary Rehab    Staff Present Renita Papa, RN BSN;Joseph [address] [place], [place], [place], CCRP, CCET    Virtual Visit No    Medication changes reported     No    Fall or balance concerns reported    No    Warm-up and Cool-down Performed on first and last piece of equipment    Resistance Training Performed Yes    VAD Patient? No    PAD/SET Patient? No      Pain Assessment   Currently in Pain? No/denies              Social History   Tobacco Use  Smoking Status Former Smoker  . Packs/day: 0.75  . Years: 67.00  . Pack years: 50.25  . Types: Cigarettes  . Start date: 103  . Quit date: 07/13/2020  . Years since quitting: 0.2  Smokeless Tobacco Never Used  Tobacco Comment   Recent Quit      Goals Met:  Independence with exercise equipment Exercise tolerated well No report of cardiac concerns or symptoms Strength training completed today  Goals Unmet:  Not Applicable  Comments: Pt able to follow exercise prescription today without complaint.  Will continue to monitor for progression.    Dr. Emily Filbert is Medical Director for [place] and [facility].

## 2020-10-03 ENCOUNTER — Other Ambulatory Visit: Payer: Self-pay

## 2020-10-03 DIAGNOSIS — J431 Panlobular emphysema: Secondary | ICD-10-CM

## 2020-10-03 NOTE — Progress Notes (Signed)
Daily Session Note  Patient Details  Name: Danilo Cappiello. MRN: [id number] Date of Birth: 09-Oct-1940 Referring Provider:   Flowsheet Row Pulmonary Rehab from 09/15/2020 in [facility] Cardiac and Pulmonary Rehab  Referring Provider Baltazar Apo MD      Encounter Date: 10/03/2020  Check In:  Session Check In - 10/03/20 1408      Check-In   Supervising physician immediately available to respond to emergencies See telemetry face sheet for immediately available ER MD    Location [facility]-Cardiac & Pulmonary Rehab    Staff Present Birdie Sons, MPA, Mauricia Area, BS, ACSM CEP, Exercise Physiologist;Kara Eliezer Bottom, MS Exercise Physiologist    Virtual Visit No    Medication changes reported     No    Fall or balance concerns reported    No    Warm-up and Cool-down Performed on first and last piece of equipment    Resistance Training Performed Yes    VAD Patient? No    PAD/SET Patient? No      Pain Assessment   Currently in Pain? No/denies              Social History   Tobacco Use  Smoking Status Former Smoker  . Packs/day: 0.75  . Years: 67.00  . Pack years: 50.25  . Types: Cigarettes  . Start date: 74  . Quit date: 07/13/2020  . Years since quitting: 0.2  Smokeless Tobacco Never Used  Tobacco Comment   Recent Quit      Goals Met:  Independence with exercise equipment Exercise tolerated well No report of cardiac concerns or symptoms Strength training completed today  Goals Unmet:  Not Applicable  Comments: Pt able to follow exercise prescription today without complaint.  Will continue to monitor for progression.    Dr. Emily Filbert is Medical Director for [place] and [facility].

## 2020-10-05 ENCOUNTER — Other Ambulatory Visit: Payer: Self-pay

## 2020-10-05 DIAGNOSIS — J431 Panlobular emphysema: Secondary | ICD-10-CM

## 2020-10-05 NOTE — Progress Notes (Signed)
Daily Session Note  Patient Details  Name: [name] [name]. MRN: [id number] Date of Birth: 1941-04-18 Referring Provider:   Flowsheet Row Pulmonary Rehab from 09/15/2020 in [facility] Cardiac and Pulmonary Rehab  Referring Provider Baltazar Apo MD      Encounter Date: 10/05/2020  Check In:  Session Check In - 10/05/20 1411      Check-In   Supervising physician immediately available to respond to emergencies See telemetry face sheet for immediately available ER MD    Location [facility]-Cardiac & Pulmonary Rehab    Staff Present Birdie Sons, MPA, Elveria Rising, BA, ACSM CEP, Exercise Physiologist;Kara Eliezer Bottom, MS Exercise Physiologist    Virtual Visit No    Medication changes reported     No    Fall or balance concerns reported    No    Warm-up and Cool-down Performed on first and last piece of equipment    Resistance Training Performed Yes    VAD Patient? No    PAD/SET Patient? No      Pain Assessment   Currently in Pain? No/denies              Social History   Tobacco Use  Smoking Status Former Smoker  . Packs/day: 0.75  . Years: 67.00  . Pack years: 50.25  . Types: Cigarettes  . Start date: 67  . Quit date: 07/13/2020  . Years since quitting: 0.2  Smokeless Tobacco Never Used  Tobacco Comment   Recent Quit      Goals Met:  Independence with exercise equipment Exercise tolerated well Personal goals reviewed No report of cardiac concerns or symptoms Strength training completed today  Goals Unmet:  Not Applicable  Comments: Pt able to follow exercise prescription today without complaint.  Will continue to monitor for progression.    Dr. Emily Filbert is Medical Director for [place] and [facility].

## 2020-10-06 ENCOUNTER — Other Ambulatory Visit: Payer: Self-pay

## 2020-10-06 ENCOUNTER — Encounter: Payer: Medicare Other | Admitting: *Deleted

## 2020-10-06 DIAGNOSIS — J431 Panlobular emphysema: Secondary | ICD-10-CM

## 2020-10-06 NOTE — Progress Notes (Signed)
Daily Session Note  Patient Details  Name: [name] [name]. MRN: [id number] Date of Birth: 1941/06/26 Referring Provider:   Flowsheet Row Pulmonary Rehab from 09/15/2020 in [facility] Card[facility] Rehab  Referring Provider Baltazar Apo MD      Encounter Date: 10/06/2020  Check In:  Session Check In - 10/06/20 1359      Check-In   Supervising physician immediately available to respond to emergencies See telemetry face sheet for immediately available ER MD    Location [facility]Cardiac & Pulmonary Rehab    Staff Present Renita Papa, RN BSN;Joseph [address] [place], [place], [place], CCRP, CCET    Virtual Visit No    Medication changes reported     No    Fall or balance concerns reported    No    Warm-up and Cool-down Performed on first and last piece of equipment    Resistance Training Performed Yes    VAD Patient? No    PAD/SET Patient? No      Pain Assessment   Currently in Pain? No/denies              Social History   Tobacco Use  Smoking Status Former Smoker  . Packs/day: 0.75  . Years: 67.00  . Pack years: 50.25  . Types: Cigarettes  . Start date: 45  . Quit date: 07/13/2020  . Years since quitting: 0.2  Smokeless Tobacco Never Used  Tobacco Comment   Recent Quit      Goals Met:  Independence with exercise equipment Exercise tolerated well No report of cardiac concerns or symptoms Strength training completed today  Goals Unmet:  Not Applicable  Comments: Pt able to follow exercise prescription today without complaint.  Will continue to monitor for progression.    Dr. Emily Filbert is Medical Director for [place] and [facility].

## 2020-10-10 ENCOUNTER — Other Ambulatory Visit: Payer: Self-pay

## 2020-10-10 DIAGNOSIS — J431 Panlobular emphysema: Secondary | ICD-10-CM | POA: Diagnosis not present

## 2020-10-10 NOTE — Progress Notes (Signed)
Daily Session Note  Patient Details  Name: [name] [name]. MRN: [id number] Date of Birth: 09/18/1940 Referring Provider:   Flowsheet Row Pulmonary Rehab from 09/15/2020 in [facility] Cardiac and Pulmonary Rehab  Referring Provider Baltazar Apo MD      Encounter Date: 10/10/2020  Check In:  Session Check In - 10/10/20 1356      Check-In   Supervising physician immediately available to respond to emergencies See telemetry face sheet for immediately available ER MD    Location [facility]-Cardiac & Pulmonary Rehab    Staff Present Birdie Sons, MPA, Mauricia Area, BS, ACSM CEP, Exercise Physiologist;Kara Eliezer Bottom, MS Exercise Physiologist    Virtual Visit No    Medication changes reported     No    Fall or balance concerns reported    No    Warm-up and Cool-down Performed on first and last piece of equipment    Resistance Training Performed Yes    VAD Patient? No    PAD/SET Patient? No      Pain Assessment   Currently in Pain? No/denies              Social History   Tobacco Use  Smoking Status Former Smoker  . Packs/day: 0.75  . Years: 67.00  . Pack years: 50.25  . Types: Cigarettes  . Start date: 28  . Quit date: 07/13/2020  . Years since quitting: 0.2  Smokeless Tobacco Never Used  Tobacco Comment   Recent Quit      Goals Met:  Independence with exercise equipment Exercise tolerated well No report of cardiac concerns or symptoms Strength training completed today  Goals Unmet:  Not Applicable  Comments: Pt able to follow exercise prescription today without complaint.  Will continue to monitor for progression.    Dr. Emily Filbert is Medical Director for [place] and [facility].

## 2020-10-12 ENCOUNTER — Other Ambulatory Visit: Payer: Self-pay

## 2020-10-12 DIAGNOSIS — J431 Panlobular emphysema: Secondary | ICD-10-CM

## 2020-10-12 NOTE — Progress Notes (Signed)
Daily Session Note  Patient Details  Name: [name] [name]. MRN: [id number] Date of Birth: 1940-08-06 Referring Provider:   Flowsheet Row Pulmonary Rehab from 09/15/2020 in [facility] Cardiac and Pulmonary Rehab  Referring Provider Baltazar Apo MD      Encounter Date: 10/12/2020  Check In:  Session Check In - 10/12/20 1412      Check-In   Supervising physician immediately available to respond to emergencies See telemetry face sheet for immediately available ER MD    Location [facility]-Cardiac & Pulmonary Rehab    Staff Present Birdie Sons, MPA, RN;Joseph Lou Miner, MS Exercise Physiologist    Virtual Visit No    Medication changes reported     No    Fall or balance concerns reported    No    Warm-up and Cool-down Performed on first and last piece of equipment    Resistance Training Performed Yes    VAD Patient? No    PAD/SET Patient? No      Pain Assessment   Currently in Pain? No/denies              Social History   Tobacco Use  Smoking Status Former Smoker  . Packs/day: 0.75  . Years: 67.00  . Pack years: 50.25  . Types: Cigarettes  . Start date: 68  . Quit date: 07/13/2020  . Years since quitting: 0.2  Smokeless Tobacco Never Used  Tobacco Comment   Recent Quit      Goals Met:  Independence with exercise equipment Exercise tolerated well No report of cardiac concerns or symptoms Strength training completed today  Goals Unmet:  Not Applicable  Comments: Pt able to follow exercise prescription today without complaint.  Will continue to monitor for progression.    Dr. Emily Filbert is Medical Director for [place] and [facility].

## 2020-10-14 DIAGNOSIS — J449 Chronic obstructive pulmonary disease, unspecified: Secondary | ICD-10-CM | POA: Diagnosis not present

## 2020-10-14 DIAGNOSIS — I872 Venous insufficiency (chronic) (peripheral): Secondary | ICD-10-CM | POA: Diagnosis not present

## 2020-10-14 DIAGNOSIS — J9611 Chronic respiratory failure with hypoxia: Secondary | ICD-10-CM | POA: Diagnosis not present

## 2020-10-14 DIAGNOSIS — L97911 Non-pressure chronic ulcer of unspecified part of right lower leg limited to breakdown of skin: Secondary | ICD-10-CM | POA: Diagnosis not present

## 2020-10-14 DIAGNOSIS — I8311 Varicose veins of right lower extremity with inflammation: Secondary | ICD-10-CM | POA: Diagnosis not present

## 2020-10-17 ENCOUNTER — Other Ambulatory Visit: Payer: Self-pay

## 2020-10-17 DIAGNOSIS — J431 Panlobular emphysema: Secondary | ICD-10-CM | POA: Diagnosis not present

## 2020-10-17 NOTE — Progress Notes (Signed)
Daily Session Note  Patient Details  Name: [name] [name]. MRN: [id number] Date of Birth: [birth year] Referring Provider:   Flowsheet Row Pulmonary Rehab from 09/15/2020 in [facility] Rehab  Referring Provider Baltazar Apo MD      Encounter Date: 10/17/2020  Check In:  Session Check In - 10/17/20 1400      Check-In   Supervising physician immediately available to respond to emergencies See telemetry face sheet for immediately available ER MD    Location [facility]-Cardiac & Pulmonary Rehab    Staff Present Birdie Sons, MPA, Mauricia Area, BS, ACSM CEP, Exercise Physiologist;Kara Eliezer Bottom, MS Exercise Physiologist    Virtual Visit No    Medication changes reported     No    Fall or balance concerns reported    No    Warm-up and Cool-down Performed on first and last piece of equipment    Resistance Training Performed Yes    VAD Patient? No    PAD/SET Patient? No      Pain Assessment   Currently in Pain? No/denies              Social History   Tobacco Use  Smoking Status Former Smoker  . Packs/day: 0.75  . Years: 67.00  . Pack years: 50.25  . Types: Cigarettes  . Start date: 27  . Quit date: 07/13/2020  . Years since quitting: 0.2  Smokeless Tobacco Never Used  Tobacco Comment   Recent Quit      Goals Met:  Independence with exercise equipment Exercise tolerated well No report of cardiac concerns or symptoms Strength training completed today  Goals Unmet:  Not Applicable  Comments: Pt able to follow exercise prescription today without complaint.  Will continue to monitor for progression.    Dr. Emily Filbert is Medical Director for [place] and [facility].

## 2020-10-19 ENCOUNTER — Other Ambulatory Visit: Payer: Self-pay

## 2020-10-19 ENCOUNTER — Encounter: Payer: Self-pay | Admitting: *Deleted

## 2020-10-19 DIAGNOSIS — J431 Panlobular emphysema: Secondary | ICD-10-CM

## 2020-10-19 NOTE — Progress Notes (Signed)
Daily Session Note  Patient Details  Name: [name] [name]. MRN: [id number] Date of Birth: Jan 18, 1941 Referring Provider:   Flowsheet Row Pulmonary Rehab from 09/15/2020 in [facility] Cardiac and Pulmonary Rehab  Referring Provider Baltazar Apo MD      Encounter Date: 10/19/2020  Check In:  Session Check In - 10/19/20 1407      Check-In   Supervising physician immediately available to respond to emergencies See telemetry face sheet for immediately available ER MD    Location [facility]-Cardiac & Pulmonary Rehab    Staff Present Birdie Sons, MPA, RN;Joseph Lou Miner, MS Exercise Physiologist    Virtual Visit No    Medication changes reported     No    Fall or balance concerns reported    No    Warm-up and Cool-down Performed on first and last piece of equipment    Resistance Training Performed Yes    VAD Patient? No    PAD/SET Patient? No      Pain Assessment   Currently in Pain? No/denies              Social History   Tobacco Use  Smoking Status Former Smoker  . Packs/day: 0.75  . Years: 67.00  . Pack years: 50.25  . Types: Cigarettes  . Start date: 41  . Quit date: 07/13/2020  . Years since quitting: 0.2  Smokeless Tobacco Never Used  Tobacco Comment   Recent Quit      Goals Met:  Independence with exercise equipment Exercise tolerated well No report of cardiac concerns or symptoms Strength training completed today  Goals Unmet:  Not Applicable  Comments: Pt able to follow exercise prescription today without complaint.  Will continue to monitor for progression.    Dr. Emily Filbert is Medical Director for [place] and [facility].

## 2020-10-19 NOTE — Progress Notes (Signed)
Pulmonary Individual Treatment Plan  Patient Details  Name: [name] [name]. 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  albuterol (PROVENTIL) (2.5 MG/3ML) 0.083% nebulizer solution, Take 3 mLs (2.5 mg total) by nebulization every 6 (six) hours as needed for wheezing or shortness of breath., Disp: 150 mL, Rfl: 5 .  Budeson-Glycopyrrol-Formoterol (BREZTRI AEROSPHERE) 160-9-4.8 MCG/ACT AERO, Inhale 2 puffs into the lungs 2 (two) times daily., Disp: 4.8 g, Rfl: 5 .  Budeson-Glycopyrrol-Formoterol (BREZTRI AEROSPHERE) 160-9-4.8 MCG/ACT AERO, Inhale 2 puffs into the lungs 2 (two) times daily., Disp: 10.7 g, Rfl: 0 .  cholecalciferol (VITAMIN D3) 25 MCG (1000 UNIT) tablet, Take 1,000 Units by mouth 3 (three) times a week. , Disp: , Rfl:  .  clopidogrel (PLAVIX) 75 MG tablet, Take 1 tablet (75 mg total) by mouth daily. Ok to restart this medication on Wednesday 04/13/20., Disp: 90 tablet, Rfl: 3 .  esomeprazole (NEXIUM) 20 MG capsule, Take 20 mg by mouth daily. , Disp: , Rfl:  .  Ipratropium-Albuterol (COMBIVENT RESPIMAT) 20-100 MCG/ACT AERS respimat, Inhale 1-2 puffs into the lungs every 6 (six) hours as needed for wheezing or shortness of breath., Disp: , Rfl:  .  ketoconazole (NIZORAL) 2 % cream, Apply 1 application topically daily., Disp: , Rfl:  .  Melatonin 5 MG CAPS, Take 5 mg by mouth at bedtime., Disp: , Rfl:  .  nitroGLYCERIN (NITROSTAT) 0.4 MG SL tablet, Place 1 tablet (0.4 mg total) under the tongue every 5 (five) minutes as needed for chest pain., Disp: 25 tablet, Rfl: 11 .  predniSONE  (DELTASONE) 5 MG tablet, Take 5 mg by mouth daily., Disp: , Rfl:  .  rosuvastatin (CRESTOR) 20 MG tablet, TAKE 1 TABLET BY MOUTH EVERYDAY AT BEDTIME, Disp: 90 tablet, Rfl: 1 .  tamsulosin (FLOMAX) 0.4 MG CAPS capsule, 1 capsule, Disp: , Rfl:  No current facility-administered medications for this visit.  Facility-Administered Medications Ordered in Other Visits:  .  thallous chloride (201 THALLIUM) injection 4 milli Curie, 4 millicurie, Intravenous, Once PRN, Larey Dresser, MD  Past Medical History: Past Medical History:  Diagnosis Date  . Acquired trigger finger of right middle finger 02/04/2020  . Acute prostatitis 04/08/2018  . Alcohol abuse   . Anal or rectal pain 04/08/2018  . Arthritis   . Atherosclerotic heart disease of native coronary artery without angina pectoris 04/08/2018  . BPH with elevated PSA   . 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 Chronic respiratory failure with hypoxia ([place]) 08/10/2019  . Cigarette nicotine dependence, uncomplicated 1/60/7371  . Coagulation disorder ([place]) 01/13/2019  . 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 COPD (chronic obstructive pulmonary disease) ([place])   . Corns and callosities 04/09/2018  . Coronary artery disease    2019 with stents  . Coronary atherosclerosis 05/07/2018  . Diarrhea 04/09/2018  . 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 Dysphagia 08/14/2018  . Dyspnea   . ED (erectile dysfunction)   . Elevated prostate specific antigen (PSA) 04/08/2018  . Elevated PSA   . Enlarged prostate without lower urinary tract symptoms (luts)  04/09/2018  . Essential (primary) hypertension 04/08/2018  . Ex-smoker 04/09/2018  . Exertional dyspnea 04/02/2018  . 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 Flatulence 04/08/2018  . Gastro-esophageal reflux disease without esophagitis 04/08/2018  . GERD (gastroesophageal reflux disease)   . History of acute otitis externa 08/14/2018  . Hyperlipidemia   . Hypertension   . Kidney cysts   . Low  back pain 04/08/2018  . Male erectile dysfunction 04/09/2018  . Mixed hyperlipidemia 04/08/2018  . Nocturia 04/09/2018  . Osteoarthritis of first carpometacarpal joint 04/08/2018  . Pain due to onychomycosis of toenail of left foot 07/20/2019  . Pain in joint 04/09/2018  . Pain in knee 04/09/2018  . Pain of finger 04/09/2018  . Palpitations   . Pneumonia   . Pneumothorax 04/12/2020  . Polypharmacy 04/09/2018  . Porokeratosis 01/13/2019  . Pulmonary nodules 06/08/2016  . Sleep disorder 04/08/2018  . Status post bronchoscopy 04/12/2020  . Tear of rotator cuff 04/09/2018  . Uncomplicated alcohol dependence ([place]) 04/09/2018  . Vitamin D deficiency 04/08/2018    Tobacco Use: Social History   Tobacco Use  Smoking Status Former Smoker  . Packs/day: 0.75  . Years: 67.00  . Pack years: 50.25  . Types: Cigarettes  . Start date: 69  . Quit date: 07/13/2020  . Years since quitting: 0.2  Smokeless Tobacco Never Used  Tobacco Comment   Recent Quit      Labs: Recent Review Flowsheet Data    Labs for ITP Cardiac and Pulmonary Rehab Latest Ref Rng & Units 06/06/2018   Cholestrol 100 - 199 mg/dL 122   LDLCALC 0 - 99 mg/dL 50   HDL >39 mg/dL 61   Trlycerides 0 - 149 mg/dL 54       Pulmonary Assessment Scores:  Pulmonary Assessment Scores    Row Name 09/15/20 1347         ADL UCSD   ADL Phase Entry     SOB Score total 56     Rest 0     Walk 2     Stairs 4     Bath 3     Dress 2     Shop 3           CAT Score   CAT Score 16           mMRC Score   mMRC Score 3            UCSD: Self-administered rating of dyspnea associated with activities of daily living (ADLs) 6-point scale (0 = "not at all" to 5 = "maximal or unable to do because of breathlessness")  Scoring Scores range from 0 to 120.  Minimally important difference is 5 units  CAT: CAT can identify the health impairment of COPD patients and is better correlated with disease progression.  CAT has a scoring range of  zero to 40. The CAT score is classified into four groups of low (less than 10), medium (10 - 20), high (21-30) and very high (31-40) based on the impact level of disease on health status. A CAT score over 10 suggests significant symptoms.  A worsening CAT score could be explained by an exacerbation, poor medication adherence, poor inhaler technique, or progression of COPD or comorbid conditions.  CAT MCID is 2 points  mMRC: mMRC (Modified Medical Research Council) Dyspnea Scale is used to assess the degree of baseline functional disability in patients of respiratory disease due to dyspnea. No minimal important difference is established. A decrease  in score of 1 point or greater is considered a positive change.   Pulmonary Function Assessment:  Pulmonary Function Assessment - 09/15/20 1347      Breath   Shortness of Breath Yes;Fear of Shortness of Breath;Limiting activity           Exercise Target Goals: Exercise Program Goal: Individual exercise prescription set using results from initial 6 min walk test and THRR while considering  patient's activity barriers and safety.   Exercise Prescription Goal: Initial exercise prescription builds to 30-45 minutes a day of aerobic activity, 2-3 days per week.  Home exercise guidelines will be given to patient during program as part of exercise prescription that the participant will acknowledge.  Education: Aerobic Exercise: - Group verbal and visual presentation on the components of exercise prescription. Introduces F.I.T.T principle from ACSM for exercise prescriptions.  Reviews F.I.T.T. principles of aerobic exercise including progression. Written material given at graduation.   Education: Resistance Exercise: - Group verbal and visual presentation on the components of exercise prescription. Introduces F.I.T.T principle from ACSM for exercise prescriptions  Reviews F.I.T.T. principles of resistance exercise including progression. Written material  given at graduation. Flowsheet Row Pulmonary Rehab from 10/12/2020 in [facility] Cardiac and Pulmonary Rehab  Date 09/21/20  Educator AS  Instruction Review Code 1- Verbalizes Understanding       Education: Exercise & Equipment Safety: - Individual verbal instruction and demonstration of equipment use and safety with use of the equipment. Flowsheet Row Pulmonary Rehab from 10/12/2020 in [facility] Cardiac and Pulmonary Rehab  Date 09/15/20  Educator [facility]  Instruction Review Code 1- Verbalizes Understanding      Education: Exercise Physiology & General Exercise Guidelines: - Group verbal and written instruction with models to review the exercise physiology of the cardiovascular system and associated critical values. Provides general exercise guidelines with specific guidelines to those with heart or lung disease.    Education: Flexibility, Balance, Mind/Body Relaxation: - Group verbal and visual presentation with interactive activity on the components of exercise prescription. Introduces F.I.T.T principle from ACSM for exercise prescriptions. Reviews F.I.T.T. principles of flexibility and balance exercise training including progression. Also discusses the mind body connection.  Reviews various relaxation techniques to help reduce and manage stress (i.e. Deep breathing, progressive muscle relaxation, and visualization). Balance handout provided to take home. Written material given at graduation. Flowsheet Row Pulmonary Rehab from 10/12/2020 in [facility] Cardiac and Pulmonary Rehab  Date 09/28/20  Educator AS  Instruction Review Code 1- Verbalizes Understanding      Activity Barriers & Risk Stratification:  Activity Barriers & Cardiac Risk Stratification - 09/15/20 1339      Activity Barriers & Cardiac Risk Stratification   Activity Barriers Shortness of Breath;Balance Concerns;Other (comment);Deconditioning;Muscular Weakness    Comments minimizes walking because of SOB.           6 Minute Walk:  6  Minute Walk    Row Name 09/15/20 1336         6 Minute Walk   Phase Initial     Distance 580 feet     Walk Time 4.18 minutes     # of Rest Breaks 2  16 sec, 1:33     MPH 1.58     METS 1.74     RPE 15     Perceived Dyspnea  3     VO2 Peak 6.04     Symptoms Yes (comment)     Comments SOB, leg fatigue     Resting HR 85 bpm  Resting BP 126/64     Resting Oxygen Saturation  99 %     Exercise Oxygen Saturation  during 6 min walk 92 %     Max Ex. HR 98 bpm     Max Ex. BP 156/74     2 Minute Post BP 136/74           Interval HR   1 Minute HR 85     2 Minute HR 82     3 Minute HR 90     4 Minute HR 94     5 Minute HR 98     6 Minute HR 83     2 Minute Post HR 94     Interval Heart Rate? Yes           Interval Oxygen   Interval Oxygen? Yes     Baseline Oxygen Saturation % 99 %     1 Minute Oxygen Saturation % 95 %     1 Minute Liters of Oxygen 4 L  pulsed     2 Minute Oxygen Saturation % 94 %     2 Minute Liters of Oxygen 4 L     3 Minute Oxygen Saturation % 91 %     3 Minute Liters of Oxygen 5 L     4 Minute Oxygen Saturation % 92 %  seated     4 Minute Liters of Oxygen 5 L     5 Minute Oxygen Saturation % 93 %     5 Minute Liters of Oxygen 5 L     6 Minute Oxygen Saturation % 92 %     6 Minute Liters of Oxygen 5 L     2 Minute Post Oxygen Saturation % 96 %     2 Minute Post Liters of Oxygen 4 L           Oxygen Initial Assessment:  Oxygen Initial Assessment - 09/15/20 1346      Home Oxygen   Home Oxygen Device Portable Concentrator    Sleep Oxygen Prescription Pulsed    Liters per minute 2    Home Exercise Oxygen Prescription Pulsed    Liters per minute 4    Home Resting Oxygen Prescription None    Compliance with Home Oxygen Use Yes      Initial 6 min Walk   Oxygen Used Pulsed;Portable Concentrator    Liters per minute 4      Program Oxygen Prescription   Program Oxygen Prescription Continuous;E-Tanks    Liters per minute 4      Intervention    Short Term Goals To learn and exhibit compliance with exercise, home and travel O2 prescription;To learn and understand importance of monitoring SPO2 with pulse oximeter and demonstrate accurate use of the pulse oximeter.;To learn and understand importance of maintaining oxygen saturations>88%;To learn and demonstrate proper pursed lip breathing techniques or other breathing techniques.;To learn and demonstrate proper use of respiratory medications    Long  Term Goals Exhibits compliance with exercise, home and travel O2 prescription;Verbalizes importance of monitoring SPO2 with pulse oximeter and return demonstration;Maintenance of O2 saturations>88%;Exhibits proper breathing techniques, such as pursed lip breathing or other method taught during program session;Compliance with respiratory medication;Demonstrates proper use of MDI's           Oxygen Re-Evaluation:  Oxygen Re-Evaluation    Row Name 09/19/20 1411 10/05/20 1433           Program Oxygen Prescription   Program Oxygen  Prescription Continuous;E-Tanks E-Tanks;Continuous      Liters per minute 4 4             Home Oxygen   Home Oxygen Device Portable Concentrator Portable Concentrator      Sleep Oxygen Prescription Pulsed None      Liters per minute 2 --      Home Exercise Oxygen Prescription Pulsed Pulsed      Liters per minute 4 3      Home Resting Oxygen Prescription None None      Compliance with Home Oxygen Use Yes Yes             Goals/Expected Outcomes   Short Term Goals To learn and exhibit compliance with exercise, home and travel O2 prescription;To learn and understand importance of monitoring SPO2 with pulse oximeter and demonstrate accurate use of the pulse oximeter.;To learn and understand importance of maintaining oxygen saturations>88%;To learn and demonstrate proper pursed lip breathing techniques or other breathing techniques. To learn and exhibit compliance with exercise, home and travel O2  prescription;To learn and understand importance of monitoring SPO2 with pulse oximeter and demonstrate accurate use of the pulse oximeter.;To learn and understand importance of maintaining oxygen saturations>88%;To learn and demonstrate proper pursed lip breathing techniques or other breathing techniques.;To learn and demonstrate proper use of respiratory medications      Long  Term Goals Exhibits compliance with exercise, home and travel O2 prescription;Verbalizes importance of monitoring SPO2 with pulse oximeter and return demonstration;Maintenance of O2 saturations>88%;Exhibits proper breathing techniques, such as pursed lip breathing or other method taught during program session Exhibits compliance with exercise, home and travel O2 prescription;Verbalizes importance of monitoring SPO2 with pulse oximeter and return demonstration;Maintenance of O2 saturations>88%;Exhibits proper breathing techniques, such as pursed lip breathing or other method taught during program session;Compliance with respiratory medication;Demonstrates proper use of MDI's      Comments Reviewed PLB technique with pt.  Talked about how it works and it's importance in maintaining their exercise saturations. Hal states that he has been having alot more shortness of breath the past week. Went over his respiratory medications and he states that he does not have an inhaler for any of his medications. Gave patient a spacer and informed him how to use it. Patient will get back to staff if he feels a difference in his breathing next week. Two of his medications can be used with his spacer.      Goals/Expected Outcomes Short: Become more profiecient at using PLB.   Long: Become independent at using PLB. Short: use spacer with his medications. Long: maintain taking his medication independently with spacer.             Oxygen Discharge (Final Oxygen Re-Evaluation):  Oxygen Re-Evaluation - 10/05/20 1433      Program Oxygen Prescription    Program Oxygen Prescription E-Tanks;Continuous    Liters per minute 4      Home Oxygen   Home Oxygen Device Portable Concentrator    Sleep Oxygen Prescription None    Home Exercise Oxygen Prescription Pulsed    Liters per minute 3    Home Resting Oxygen Prescription None    Compliance with Home Oxygen Use Yes      Goals/Expected Outcomes   Short Term Goals To learn and exhibit compliance with exercise, home and travel O2 prescription;To learn and understand importance of monitoring SPO2 with pulse oximeter and demonstrate accurate use of the pulse oximeter.;To learn and understand importance of maintaining oxygen saturations>88%;To learn  and demonstrate proper pursed lip breathing techniques or other breathing techniques.;To learn and demonstrate proper use of respiratory medications    Long  Term Goals Exhibits compliance with exercise, home and travel O2 prescription;Verbalizes importance of monitoring SPO2 with pulse oximeter and return demonstration;Maintenance of O2 saturations>88%;Exhibits proper breathing techniques, such as pursed lip breathing or other method taught during program session;Compliance with respiratory medication;Demonstrates proper use of MDI's    Comments Hal states that he has been having alot more shortness of breath the past week. Went over his respiratory medications and he states that he does not have an inhaler for any of his medications. Gave patient a spacer and informed him how to use it. Patient will get back to staff if he feels a difference in his breathing next week. Two of his medications can be used with his spacer.    Goals/Expected Outcomes Short: use spacer with his medications. Long: maintain taking his medication independently with spacer.           Initial Exercise Prescription:  Initial Exercise Prescription - 09/15/20 1300      Date of Initial Exercise RX and Referring Provider   Date 09/15/20    Referring Provider Baltazar Apo MD       Oxygen   Oxygen Continuous    Liters 4      Treadmill   MPH 1.3    Grade 0.5    Minutes 15    METs 2.09      Recumbant Bike   Level 1    RPM 50    Watts 6    Minutes 15    METs 2      Arm Ergometer   Level 1    Watts 18    RPM 25    Minutes 15    METs 2      REL-XR   Level 1    Speed 50    Minutes 15    METs 2      Prescription Details   Frequency (times per week) 3    Duration Progress to 30 minutes of continuous aerobic without signs/symptoms of physical distress      Intensity   THRR 40-80% of Max Heartrate 107-130    Ratings of Perceived Exertion 11-13    Perceived Dyspnea 0-4      Progression   Progression Continue to progress workloads to maintain intensity without signs/symptoms of physical distress.      Resistance Training   Training Prescription Yes    Weight 3 lb    Reps 10-15           Perform Capillary Blood Glucose checks as needed.  Exercise Prescription Changes:  Exercise Prescription Changes    Row Name 09/15/20 1300 10/03/20 1600 10/17/20 1000         Response to Exercise   Blood Pressure (Admit) 126/64 132/60 104/58     Blood Pressure (Exercise) 156/74 138/68 132/60     Blood Pressure (Exit) 122/68 134/70 98/60     Heart Rate (Admit) 85 bpm 82 bpm 93 bpm     Heart Rate (Exercise) 98 bpm 86 bpm 96 bpm     Heart Rate (Exit) 87 bpm 83 bpm 93 bpm     Oxygen Saturation (Admit) 99 % 97 % 99 %     Oxygen Saturation (Exercise) 91 % 96 % 94 %     Oxygen Saturation (Exit) 99 % 100 % 98 %     Rating of Perceived  Exertion (Exercise) 15 13 12      Perceived Dyspnea (Exercise) 3 2 2      Symptoms SOB, leg fatigue SOB SOB     Comments walk test results -- --     Duration -- Continue with 30 min of aerobic exercise without signs/symptoms of physical distress. Continue with 30 min of aerobic exercise without signs/symptoms of physical distress.     Intensity -- THRR unchanged THRR unchanged           Progression   Progression -- Continue  to progress workloads to maintain intensity without signs/symptoms of physical distress. Continue to progress workloads to maintain intensity without signs/symptoms of physical distress.     Average METs -- 1.99 2.27           Resistance Training   Training Prescription -- Yes Yes     Weight -- 3 lb 3 lb     Reps -- 10-15 10-15           Interval Training   Interval Training -- No No           Oxygen   Oxygen -- Continuous Continuous     Liters -- 4 4           Recumbant Bike   Level -- 2 3     Watts -- 16 --     Minutes -- 15 15     METs -- 2.67 3.14           NuStep   Level -- 1 1     Minutes -- 15 15     METs -- 1.5 1.4           Arm Ergometer   Level -- 1 --     Minutes -- 115 --     METs -- 1.8 --            Exercise Comments:  Exercise Comments    Row Name 09/19/20 1410           Exercise Comments First full day of exercise!  Patient was oriented to gym and equipment including functions, settings, policies, and procedures.  Patient's individual exercise prescription and treatment plan were reviewed.  All starting workloads were established based on the results of the 6 minute walk test done at initial orientation visit.  The plan for exercise progression was also introduced and progression will be customized based on patient's performance and goals.              Exercise Goals and Review:  Exercise Goals    Row Name 09/15/20 1342             Exercise Goals   Increase Physical Activity Yes       Intervention Provide advice, education, support and counseling about physical activity/exercise needs.;Develop an individualized exercise prescription for aerobic and resistive training based on initial evaluation findings, risk stratification, comorbidities and participant's personal goals.       Expected Outcomes Short Term: Attend rehab on a regular basis to increase amount of physical activity.;Long Term: Add in home exercise to make exercise part of  routine and to increase amount of physical activity.;Long Term: Exercising regularly at least 3-5 days a week.       Increase Strength and Stamina Yes       Intervention Provide advice, education, support and counseling about physical activity/exercise needs.;Develop an individualized exercise prescription for aerobic and resistive training based on initial evaluation findings, risk stratification, comorbidities and  participant's personal goals.       Expected Outcomes Short Term: Perform resistance training exercises routinely during rehab and add in resistance training at home;Short Term: Increase workloads from initial exercise prescription for resistance, speed, and METs.;Long Term: Improve cardiorespiratory fitness, muscular endurance and strength as measured by increased METs and functional capacity (6MWT)       Able to understand and use rate of perceived exertion (RPE) scale Yes       Intervention Provide education and explanation on how to use RPE scale       Expected Outcomes Short Term: Able to use RPE daily in rehab to express subjective intensity level;Long Term:  Able to use RPE to guide intensity level when exercising independently       Able to understand and use Dyspnea scale Yes       Intervention Provide education and explanation on how to use Dyspnea scale       Expected Outcomes Short Term: Able to use Dyspnea scale daily in rehab to express subjective sense of shortness of breath during exertion;Long Term: Able to use Dyspnea scale to guide intensity level when exercising independently       Knowledge and understanding of Target Heart Rate Range (THRR) Yes       Intervention Provide education and explanation of THRR including how the numbers were predicted and where they are located for reference       Expected Outcomes Short Term: Able to state/look up THRR;Short Term: Able to use daily as guideline for intensity in rehab;Long Term: Able to use THRR to govern intensity when  exercising independently       Able to check pulse independently Yes       Intervention Provide education and demonstration on how to check pulse in carotid and radial arteries.;Review the importance of being able to check your own pulse for safety during independent exercise       Expected Outcomes Short Term: Able to explain why pulse checking is important during independent exercise;Long Term: Able to check pulse independently and accurately       Understanding of Exercise Prescription Yes       Intervention Provide education, explanation, and written materials on patient's individual exercise prescription       Expected Outcomes Short Term: Able to explain program exercise prescription;Long Term: Able to explain home exercise prescription to exercise independently              Exercise Goals Re-Evaluation :  Exercise Goals Re-Evaluation    Row Name 09/19/20 1410 10/03/20 1611 10/05/20 1442 10/17/20 1043       Exercise Goal Re-Evaluation   Exercise Goals Review Increase Physical Activity;Able to understand and use rate of perceived exertion (RPE) scale;Knowledge and understanding of Target Heart Rate Range (THRR);Understanding of Exercise Prescription;Increase Strength and Stamina;Able to understand and use Dyspnea scale;Able to check pulse independently Increase Physical Activity;Increase Strength and Stamina;Understanding of Exercise Prescription Understanding of Exercise Prescription;Increase Physical Activity;Increase Strength and Stamina Increase Physical Activity;Increase Strength and Stamina    Comments Reviewed RPE and dyspnea scales, THR and program prescription with pt today.  Pt voiced understanding and was given a copy of goals to take home. Hal is off to a good start in rehab.  He is now up to 16 watts on the bike.  We will continue to monitor his progress. Hal is going to go the they gym at [facility] where he and his wife live after the program is over. He  is doing well  maintaining his shortness of breath in the program. He rest when he needs but has to work on PLB when he is exercising more. Hal is tolerating exercise well.  Hs oxygen has stayed in mid to upper 90s on 4L continuous.    Expected Outcomes Short: Use RPE daily to regulate intensity. Long: Follow program prescription in THR. Short: Continue to attend regularly Long: Continue to follow program prescription Short: work on PLB while exercising. Long: PLB when exercising independently. Short: try to increae levels on machines Long: improve overall stamina           Discharge Exercise Prescription (Final Exercise Prescription Changes):  Exercise Prescription Changes - 10/17/20 1000      Response to Exercise   Blood Pressure (Admit) 104/58    Blood Pressure (Exercise) 132/60    Blood Pressure (Exit) 98/60    Heart Rate (Admit) 93 bpm    Heart Rate (Exercise) 96 bpm    Heart Rate (Exit) 93 bpm    Oxygen Saturation (Admit) 99 %    Oxygen Saturation (Exercise) 94 %    Oxygen Saturation (Exit) 98 %    Rating of Perceived Exertion (Exercise) 12    Perceived Dyspnea (Exercise) 2    Symptoms SOB    Duration Continue with 30 min of aerobic exercise without signs/symptoms of physical distress.    Intensity THRR unchanged      Progression   Progression Continue to progress workloads to maintain intensity without signs/symptoms of physical distress.    Average METs 2.27      Resistance Training   Training Prescription Yes    Weight 3 lb    Reps 10-15      Interval Training   Interval Training No      Oxygen   Oxygen Continuous    Liters 4      Recumbant Bike   Level 3    Minutes 15    METs 3.14      NuStep   Level 1    Minutes 15    METs 1.4           Nutrition:  Target Goals: Understanding of nutrition guidelines, daily intake of sodium 1500mg , cholesterol 200mg , calories 30% from fat and 7% or less from saturated fats, daily to have 5 or more servings of fruits and  vegetables.  Education: All About Nutrition: -Group instruction provided by verbal, written material, interactive activities, discussions, models, and posters to present general guidelines for heart healthy nutrition including fat, fiber, MyPlate, the role of sodium in heart healthy nutrition, utilization of the nutrition label, and utilization of this knowledge for meal planning. Follow up email sent as well. Written material given at graduation. Flowsheet Row Pulmonary Rehab from 10/12/2020 in [facility] Cardiac and Pulmonary Rehab  Date 10/05/20  Educator [facility]  Instruction Review Code 1- Verbalizes Understanding      Biometrics:  Pre Biometrics - 09/15/20 1343      Pre Biometrics   Height 5' 9.75" (1.772 m)    Weight 150 lb 3.2 oz (68.1 kg)    BMI (Calculated) 21.7    Single Leg Stand 2.9 seconds            Nutrition Therapy Plan and Nutrition Goals:  Nutrition Therapy & Goals - 09/21/20 1543      Nutrition Therapy   Diet Pulmonary MNT    Protein (specify units) 82g    Fiber 30 grams    Whole Grain Foods 3  servings    Saturated Fats 12 max. grams    Fruits and Vegetables 8 servings/day    Sodium 1.5 grams      Personal Nutrition Goals   Nutrition Goal ST: add fat and protein to breakfast - butter, nuts/seeds, eggs LT: gain weight - 170lbs    Comments Ensure some of the time. Handful of pistachios. B: english muffin or half of bagel or cold cereal with fruit, egg with bacon and toast 1x/week with coffee and orange juice S: chocolate and pistachios L: cup of soup, sandwich, chips, drink S: milkshake D: meat (variety - fish, meat, chicken), vegetable (green vegetables), starch, salad. S: ice cream  Hal report losing weight everytime he went to the doctor and has been eating more to gaiun weight, he is now relatively weight stable at 150 lbs, but would like to be 170lbs. Discussed pulmonary nutrition including more protein. suggested snacks like peanut butter to add fat and protein,  protein with breakfast like boiled eggs and some additional fat.      Intervention Plan   Intervention Prescribe, educate and counsel regarding individualized specific dietary modifications aiming towards targeted core components such as weight, hypertension, lipid management, diabetes, heart failure and other comorbidities.;Nutrition handout(s) given to patient.    Expected Outcomes Short Term Goal: Understand basic principles of dietary content, such as calories, fat, sodium, cholesterol and nutrients.;Short Term Goal: A plan has been developed with personal nutrition goals set during dietitian appointment.;Long Term Goal: Adherence to prescribed nutrition plan.           Nutrition Assessments:  MEDIFICTS Score Key:  ?70 Need to make dietary changes   40-70 Heart Healthy Diet  ? 40 Therapeutic Level Cholesterol Diet  Flowsheet Row Pulmonary Rehab from 09/15/2020 in [facility] Cardiac and Pulmonary Rehab  Picture Your Plate Total Score on Admission 54     Picture Your Plate Scores:  <38 Unhealthy dietary pattern with much room for improvement.  41-50 Dietary pattern unlikely to meet recommendations for good health and room for improvement.  51-60 More healthful dietary pattern, with some room for improvement.   >60 Healthy dietary pattern, although there may be some specific behaviors that could be improved.   Nutrition Goals Re-Evaluation:   Nutrition Goals Discharge (Final Nutrition Goals Re-Evaluation):   Psychosocial: Target Goals: Acknowledge presence or absence of significant depression and/or stress, maximize coping skills, provide positive support system. Participant is able to verbalize types and ability to use techniques and skills needed for reducing stress and depression.   Education: Stress, Anxiety, and Depression - Group verbal and visual presentation to define topics covered.  Reviews how body is impacted by stress, anxiety, and depression.  Also discusses  healthy ways to reduce stress and to treat/manage anxiety and depression.  Written material given at graduation.   Education: Sleep Hygiene -Provides group verbal and written instruction about how sleep can affect your health.  Define sleep hygiene, discuss sleep cycles and impact of sleep habits. Review good sleep hygiene tips.    Initial Review & Psychosocial Screening:  Initial Psych Review & Screening - 09/12/20 1136      Barriers   Psychosocial barriers to participate in program There are no identifiable barriers or psychosocial needs.      Screening Interventions   Interventions Encouraged to exercise;To provide support and resources with identified psychosocial needs;Provide feedback about the scores to participant           Quality of Life Scores:  Scores of 19  and below usually indicate a poorer quality of life in these areas.  A difference of  2-3 points is a clinically meaningful difference.  A difference of 2-3 points in the total score of the Quality of Life Index has been associated with significant improvement in overall quality of life, self-image, physical symptoms, and general health in studies assessing change in quality of life.  PHQ-9: Recent Review Flowsheet Data    Depression screen [facility] 2/9 09/15/2020   Decreased Interest 0   Down, Depressed, Hopeless 0   PHQ - 2 Score 0   Altered sleeping 0   Tired, decreased energy 0   Change in appetite 0   Feeling bad or failure about yourself  0   Trouble concentrating 0   Moving slowly or fidgety/restless 0   Suicidal thoughts 0   PHQ-9 Score 0   Difficult doing work/chores Not difficult at all     Interpretation of Total Score  Total Score Depression Severity:  1-4 = Minimal depression, 5-9 = Mild depression, 10-14 = Moderate depression, 15-19 = Moderately severe depression, 20-27 = Severe depression   Psychosocial Evaluation and Intervention:  Psychosocial Evaluation - 09/12/20 1137      Psychosocial  Evaluation & Interventions   Interventions Encouraged to exercise with the program and follow exercise prescription    Comments Nefi has no barriers to entering the program. He lives in a [place] [facility] [place] with his wife and their dog. THey had moved to [facility] last year from [facility]. He keeps busy with social activities- a golf group on Sundays which is golf and then a meal. He has not been able to play golf for the past 4-5 months as the course has restrictions on how close a cart can get to the green and his shortness of breath limits his ability to play with the retsitctions.  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 He stays active planning golf outings out of town at least 2 times a year.  He does not usually walk the dog , as his wife does that. He does take to dog to their dog pasrk at [facility] and to the dog park in [place]. He does have a portable oxygen concentrator that he will use if needed during the day. Walking to the car and then off when driving is an example. He quit tobacco in Dec 2021 and feels confident he will not resume tobacco use. He lives in a smoke free environment. His shortness of breath is limintg to him. He is hoping to see improvement to enable him to participate more in his social activities. He is concerned that he is losing too much weight. He is around 150 pounds from 175 pounds last year. He did find an article that speaks to the calori needs of a patient with COPD and is interested in learning more so he can gain back some weight and stop weight loss.    Expected Outcomes STG: Raymar attends all scheduled appointments, he remains tobacco free. He meets with RD about his weight concerns.  LTG: Able to maintian tobacco cessation, has learned about maintianing his weight, ways to improve his SOB and is able to use the tools for improve quality of life           Psychosocial Re-Evaluation:  Psychosocial Re-Evaluation     [place] Name 10/05/20 1440  Psychosocial Re-Evaluation   Current issues with Current Stress Concerns       Comments Patient states that he is generally a positve person but sometimes gets down becuase he cannot do the things he use to do such as golfing. His shortness of breath often gets in the way of him being able to do things.       Expected Outcomes Short: Continue to exercise regularly to support mental health and notify staff of any changes. Long: maintain mental health and well being through teaching of rehab or prescribed medications independently.       Interventions Encouraged to attend Pulmonary Rehabilitation for the exercise       Continue Psychosocial Services  Follow up required by staff              Psychosocial Discharge (Final Psychosocial Re-Evaluation):  Psychosocial Re-Evaluation - 10/05/20 1440      Psychosocial Re-Evaluation   Current issues with Current Stress Concerns    Comments Patient states that he is generally a positve person but sometimes gets down becuase he cannot do the things he use to do such as golfing. His shortness of breath often gets in the way of him being able to do things.    Expected Outcomes Short: Continue to exercise regularly to support mental health and notify staff of any changes. Long: maintain mental health and well being through teaching of rehab or prescribed medications independently.    Interventions Encouraged to attend Pulmonary Rehabilitation for the exercise    Continue Psychosocial Services  Follow up required by staff           Education: Education Goals: Education classes will be provided on a weekly basis, covering required topics. Participant will state understanding/return demonstration of topics presented.  Learning Barriers/Preferences:  Learning Barriers/Preferences - 09/12/20 1124      Learning Barriers/Preferences   Learning Barriers None    Learning Preferences None           General  Pulmonary Education Topics:  Infection Prevention: - Provides verbal and written material to individual with discussion of infection control including proper hand washing and proper equipment cleaning during exercise session. Flowsheet Row Pulmonary Rehab from 10/12/2020 in [facility] Cardiac and Pulmonary Rehab  Date 09/15/20  Educator [facility]  Instruction Review Code 1- Verbalizes Understanding      Falls Prevention: - Provides verbal and written material to individual with discussion of falls prevention and safety. Flowsheet Row Pulmonary Rehab from 10/12/2020 in [facility] Cardiac and Pulmonary Rehab  Date 09/12/20  Educator SB  Instruction Review Code 1- Verbalizes Understanding      Chronic Lung Disease Review: - Group verbal instruction with posters, models, PowerPoint presentations and videos,  to review new updates, new respiratory medications, new advancements in procedures and treatments. Providing information on websites and "800" numbers for continued self-education. Includes information about supplement oxygen, available portable oxygen systems, continuous and intermittent flow rates, oxygen safety, concentrators, and Medicare reimbursement for oxygen. Explanation of Pulmonary Drugs, including class, frequency, complications, importance of spacers, rinsing mouth after steroid MDI's, and proper cleaning methods for nebulizers. Review of basic lung anatomy and physiology related to function, structure, and complications of lung disease. Review of risk factors. Discussion about methods for diagnosing sleep apnea and types of masks and machines for OSA. Includes a review of the use of types of environmental controls: home humidity, furnaces, filters, dust mite/pet prevention, HEPA vacuums. Discussion about weather changes, air quality and the benefits of nasal washing.  Instruction on Warning signs, infection symptoms, calling MD promptly, preventive modes, and value of vaccinations. Review of effective  airway clearance, coughing and/or vibration techniques. Emphasizing that all should Create an Action Plan. Written material given at graduation. Flowsheet Row Pulmonary Rehab from 10/12/2020 in [facility] Cardiac and Pulmonary Rehab  Education need identified 09/15/20      AED/CPR: - Group verbal and written instruction with the use of models to demonstrate the basic use of the AED with the basic ABC's of resuscitation.    Anatomy and Cardiac Procedures: - Group verbal and visual presentation and models provide information about basic cardiac anatomy and function. Reviews the testing methods done to diagnose heart disease and the outcomes of the test results. Describes the treatment choices: Medical Management, Angioplasty, or Coronary Bypass Surgery for treating various heart conditions including Myocardial Infarction, Angina, Valve Disease, and Cardiac Arrhythmias.  Written material given at graduation. Flowsheet Row Pulmonary Rehab from 10/12/2020 in [facility] Cardiac and Pulmonary Rehab  Date 09/21/20  Educator [facility]  Instruction Review Code 1- Verbalizes Understanding      Medication Safety: - Group verbal and visual instruction to review commonly prescribed medications for heart and lung disease. Reviews the medication, class of the drug, and side effects. Includes the steps to properly store meds and maintain the prescription regimen.  Written material given at graduation. Flowsheet Row Pulmonary Rehab from 10/12/2020 in [facility] Cardiac and Pulmonary Rehab  Date 10/12/20  Educator [facility]  Instruction Review Code 1- Verbalizes Understanding      Other: -Provides group and verbal instruction on various topics (see comments)   Knowledge Questionnaire Score:  Knowledge Questionnaire Score - 09/15/20 1344      Knowledge Questionnaire Score   Pre Score 16/18 Education Focus: O2 safety            Core Components/Risk Factors/Patient Goals at Admission:  Personal Goals and Risk Factors at  Admission - 09/15/20 1345      Core Components/Risk Factors/Patient Goals on Admission    Weight Management Yes;Weight Gain    Intervention Weight Management: Develop a combined nutrition and exercise program designed to reach desired caloric intake, while maintaining appropriate intake of nutrient and fiber, sodium and fats, and appropriate energy expenditure required for the weight goal.;Weight Management: Provide education and appropriate resources to help participant work on and attain dietary goals.    Admit Weight 150 lb 3.2 oz (68.1 kg)    Goal Weight: Short Term 152 lb (68.9 kg)    Goal Weight: Long Term 170 lb (77.1 kg)    Expected Outcomes Short Term: Continue to assess and modify interventions until short term weight is achieved;Long Term: Adherence to nutrition and physical activity/exercise program aimed toward attainment of established weight goal;Weight Gain: Understanding of general recommendations for a high calorie, high protein meal plan that promotes weight gain by distributing calorie intake throughout the day with the consumption for 4-5 meals, snacks, and/or supplements    Tobacco Cessation Yes    Number of packs per day o since 07/13/2020    Intervention Assist the participant in steps to quit. Provide individualized education and counseling about committing to Tobacco Cessation, relapse prevention, and pharmacological support that can be provided by physician.;Advice worker, assist with locating and accessing local/national Quit Smoking programs, and support quit date choice.    Expected Outcomes Short Term: Will demonstrate readiness to quit, by selecting a quit date.;Short Term: Will quit all tobacco product use, adhering to prevention of relapse plan.;Long Term: Complete  abstinence from all tobacco products for at least 12 months from quit date.    Improve shortness of breath with ADL's Yes    Intervention Provide education, individualized exercise plan and  daily activity instruction to help decrease symptoms of SOB with activities of daily living.    Expected Outcomes Short Term: Improve cardiorespiratory fitness to achieve a reduction of symptoms when performing ADLs;Long Term: Be able to perform more ADLs without symptoms or delay the onset of symptoms    Hypertension Yes    Intervention Provide education on lifestyle modifcations including regular physical activity/exercise, weight management, moderate sodium restriction and increased consumption of fresh fruit, vegetables, and low fat dairy, alcohol moderation, and smoking cessation.;Monitor prescription use compliance.    Expected Outcomes Short Term: Continued assessment and intervention until BP is < 140/33mm HG in hypertensive participants. < 130/66mm HG in hypertensive participants with diabetes, heart failure or chronic kidney disease.;Long Term: Maintenance of blood pressure at goal levels.    Lipids Yes    Intervention Provide education and support for participant on nutrition & aerobic/resistive exercise along with prescribed medications to achieve LDL 70mg , HDL >40mg .    Expected Outcomes Short Term: Participant states understanding of desired cholesterol values and is compliant with medications prescribed. Participant is following exercise prescription and nutrition guidelines.;Long Term: Cholesterol controlled with medications as prescribed, with individualized exercise RX and with personalized nutrition plan. Value goals: LDL < 70mg , HDL > 40 mg.           Education:Diabetes - Individual verbal and written instruction to review signs/symptoms of diabetes, desired ranges of glucose level fasting, after meals and with exercise. Acknowledge that pre and post exercise glucose checks will be done for 3 sessions at entry of program.   Know Your Numbers and Heart Failure: - Group verbal and visual instruction to discuss disease risk factors for cardiac and pulmonary disease and treatment  options.  Reviews associated critical values for Overweight/Obesity, Hypertension, Cholesterol, and Diabetes.  Discusses basics of heart failure: signs/symptoms and treatments.  Introduces Heart Failure Zone chart for action plan for heart failure.  Written material given at graduation.   Core Components/Risk Factors/Patient Goals Review:   Goals and Risk Factor Review    Row Name 10/05/20 1435             Core Components/Risk Factors/Patient Goals Review   Personal Goals Review Improve shortness of breath with ADL's       Review Spoke to patient about their shortness of breath and what they can do to improve. Patient has been informed of breathing techniques when starting the program. Patient is informed to tell staff if they have had any med changes and that certain meds they are taking or not taking can be causing shortness of breath.       Expected Outcomes Short: Attend [facility] regularly to improve shortness of breath with ADL's. Long: maintain independence with ADL's              Core Components/Risk Factors/Patient Goals at Discharge (Final Review):   Goals and Risk Factor Review - 10/05/20 1435      Core Components/Risk Factors/Patient Goals Review   Personal Goals Review Improve shortness of breath with ADL's    Review Spoke to patient about their shortness of breath and what they can do to improve. Patient has been informed of breathing techniques when starting the program. Patient is informed to tell staff if they have had any med changes and that certain meds  they are taking or not taking can be causing shortness of breath.    Expected Outcomes Short: Attend [facility] regularly to improve shortness of breath with ADL's. Long: maintain independence with ADL's           ITP Comments:  ITP Comments    Row Name 09/12/20 1152 09/15/20 1336 09/19/20 1410 09/21/20 0658 10/19/20 1001   ITP Comments Virtual orientation call completed today. he has an appointment on Date:  09/15/2020 for EP eval and gym Orientation.  Documentation of diagnosis can be found in [facility]  Date: 05/31/2020. Completed 6MWT and gym orientation. Initial ITP created and sent for review to Dr. Emily Filbert, Medical Director. First full day of exercise!  Patient was oriented to gym and equipment including functions, settings, policies, and procedures.  Patient's individual exercise prescription and treatment plan were reviewed.  All starting workloads were established based on the results of the 6 minute walk test done at initial orientation visit.  The plan for exercise progression was also introduced and progression will be customized based on patient's performance and goals. 30 Day review completed. Medical Director ITP review done, changes made as directed, and signed approval by Medical Director.  New to program 30 Day review completed. Medical Director ITP review done, changes made as directed, and signed approval by Medical Director.          Comments:

## 2020-10-20 ENCOUNTER — Other Ambulatory Visit: Payer: Self-pay

## 2020-10-20 ENCOUNTER — Encounter: Payer: Medicare Other | Admitting: *Deleted

## 2020-10-20 DIAGNOSIS — J431 Panlobular emphysema: Secondary | ICD-10-CM | POA: Diagnosis not present

## 2020-10-20 NOTE — Progress Notes (Signed)
Daily Session Note  Patient Details  Name: Armari Fussell. MRN: [id number] Date of Birth: Sep 26, 1940 Referring Provider:   Flowsheet Row Pulmonary Rehab from 09/15/2020 in [facility] Card[facility] and Pulmonary Rehab  Referring Provider Baltazar Apo MD      Encounter Date: 10/20/2020  Check In:  Session Check In - 10/20/20 1401      Check-In   Supervising physician immediately available to respond to emergencies See telemetry face sheet for immediately available ER MD    Location [facility]-Cardiac & Pulmonary Rehab    Staff Present Renita Papa, RN BSN;Joseph [address] [place], [place], RCEP, CCRP, CCET    Virtual Visit No    Medication changes reported     No    Fall or balance concerns reported    No    Warm-up and Cool-down Performed on first and last piece of equipment    Resistance Training Performed Yes    VAD Patient? No    PAD/SET Patient? No      Pain Assessment   Currently in Pain? No/denies              Social History   Tobacco Use  Smoking Status Former Smoker  . Packs/day: 0.75  . Years: 67.00  . Pack years: 50.25  . Types: Cigarettes  . Start date: 65  . Quit date: 07/13/2020  . Years since quitting: 0.2  Smokeless Tobacco Never Used  Tobacco Comment   Recent Quit      Goals Met:  Independence with exercise equipment Exercise tolerated well No report of cardiac concerns or symptoms Strength training completed today  Goals Unmet:  Not Applicable  Comments: Pt able to follow exercise prescription today without complaint.  Will continue to monitor for progression.    Dr. Emily Filbert is Medical Director for [facility] and [facility].

## 2020-10-26 ENCOUNTER — Other Ambulatory Visit: Payer: Self-pay

## 2020-10-26 DIAGNOSIS — J431 Panlobular emphysema: Secondary | ICD-10-CM

## 2020-10-26 NOTE — Progress Notes (Signed)
Daily Session Note  Patient Details  Name: [name] A Carsey Jr. MRN: [id number] Date of Birth: 10/02/1940 Referring Provider:   Flowsheet Row Pulmonary Rehab from 09/15/2020 in [facility] Cardiac and Pulmonary Rehab  Referring Provider Byrum, Robert MD      Encounter Date: 10/26/2020  Check In:  Session Check In - 10/26/20 1404      Check-In   Supervising physician immediately available to respond to emergencies See telemetry face sheet for immediately available ER MD    Location [facility]    Staff Present Kelly Bollinger, MPA, RN;Melissa Caiola RDN, LDN;Susanne Bice, RN, BSN, CCRP;Jessica Hawkins, MA, RCEP, CCRP, CCET    Virtual Visit No    Medication changes reported     No    Fall or balance concerns reported    No    Warm-up and Cool-down Performed on first and last piece of equipment    Resistance Training Performed Yes    VAD Patient? No    PAD/SET Patient? No      Pain Assessment   Currently in Pain? No/denies              Social History   Tobacco Use  Smoking Status Former Smoker  . Packs/day: 0.75  . Years: 67.00  . Pack years: 50.25  . Types: Cigarettes  . Start date: 1954  . Quit date: 07/13/2020  . Years since quitting: 0.2  Smokeless Tobacco Never Used  Tobacco Comment   Recent Quit      Goals Met:  Independence with exercise equipment Exercise tolerated well No report of cardiac concerns or symptoms Strength training completed today  Goals Unmet:  Not Applicable  Comments: Pt able to follow exercise prescription today without complaint.  Will continue to monitor for progression.    Dr. Mark Miller is Medical Director for [facility] and [facility]. 

## 2020-10-27 ENCOUNTER — Other Ambulatory Visit: Payer: Self-pay

## 2020-10-27 ENCOUNTER — Encounter: Payer: Medicare Other | Admitting: *Deleted

## 2020-10-27 DIAGNOSIS — J431 Panlobular emphysema: Secondary | ICD-10-CM

## 2020-10-27 MED ORDER — CLOPIDOGREL BISULFATE 75 MG PO TABS: 75.0000 mg | ORAL_TABLET | Freq: Every day | ORAL | 1 refills | Status: DC

## 2020-10-27 NOTE — Progress Notes (Signed)
Daily Session Note  Patient Details  Name: [name] [name]. MRN: [id number] Date of Birth: 05-14-1941 Referring Provider:   Flowsheet Row Pulmonary Rehab from 09/15/2020 in [facility] Cardiac and Pulmonary Rehab  Referring Provider Baltazar Apo MD      Encounter Date: 10/27/2020  Check In:  Session Check In - 10/27/20 1402      Check-In   Supervising physician immediately available to respond to emergencies See telemetry face sheet for immediately available ER MD    Location [facility]    Staff Present Renita Papa, RN BSN;Joseph [address] [place], [place], [place], CCRP, CCET    Virtual Visit No    Medication changes reported     No    Fall or balance concerns reported    No    Warm-up and Cool-down Performed on first and last piece of equipment    Resistance Training Performed Yes    VAD Patient? No    PAD/SET Patient? No      Pain Assessment   Currently in Pain? No/denies              Social History   Tobacco Use  Smoking Status Former Smoker  . Packs/day: 0.75  . Years: 67.00  . Pack years: 50.25  . Types: Cigarettes  . Start date: 66  . Quit date: 07/13/2020  . Years since quitting: 0.2  Smokeless Tobacco Never Used  Tobacco Comment   Recent Quit      Goals Met:  Independence with exercise equipment Exercise tolerated well No report of cardiac concerns or symptoms Strength training completed today  Goals Unmet:  Not Applicable  Comments: Pt able to follow exercise prescription today without complaint.  Will continue to monitor for progression.    Dr. Emily Filbert is Medical Director for [facility] and [facility].

## 2020-10-27 NOTE — Telephone Encounter (Signed)
Refill of Clopidogrel 75 mg sent to [facility] in [place].

## 2020-10-31 ENCOUNTER — Encounter: Payer: Medicare Other | Attending: Emergency Medicine

## 2020-10-31 ENCOUNTER — Other Ambulatory Visit: Payer: Self-pay

## 2020-10-31 DIAGNOSIS — J431 Panlobular emphysema: Secondary | ICD-10-CM | POA: Diagnosis not present

## 2020-10-31 NOTE — Progress Notes (Signed)
Daily Session Note  Patient Details  Name: [name] [name]. MRN: [id number] Date of Birth: [birth year] Referring Provider:   Flowsheet Row Pulmonary Rehab from 09/15/2020 in [facility] Cardiac and Pulmonary Rehab  Referring Provider Baltazar Apo MD      Encounter Date: 10/31/2020  Check In:  Session Check In - 10/31/20 1404      Check-In   Supervising physician immediately available to respond to emergencies See telemetry face sheet for immediately available ER MD    Location [facility]    Staff Present Birdie Sons, MPA, Mauricia Area, BS, ACSM CEP, Exercise Physiologist;Kara Eliezer Bottom, MS Exercise Physiologist    Virtual Visit No    Medication changes reported     No    Fall or balance concerns reported    No    Warm-up and Cool-down Performed on first and last piece of equipment    Resistance Training Performed Yes    VAD Patient? No    PAD/SET Patient? No      Pain Assessment   Currently in Pain? No/denies              Social History   Tobacco Use  Smoking Status Former Smoker  . Packs/day: 0.75  . Years: 67.00  . Pack years: 50.25  . Types: Cigarettes  . Start date: 72  . Quit date: 07/13/2020  . Years since quitting: 0.3  Smokeless Tobacco Never Used  Tobacco Comment   Recent Quit      Goals Met:  Independence with exercise equipment Exercise tolerated well No report of cardiac concerns or symptoms Strength training completed today  Goals Unmet:  Not Applicable  Comments: Pt able to follow exercise prescription today without complaint.  Will continue to monitor for progression.    Dr. Emily Filbert is Medical Director for [facility] and [facility].

## 2020-11-02 ENCOUNTER — Other Ambulatory Visit: Payer: Self-pay

## 2020-11-02 DIAGNOSIS — J431 Panlobular emphysema: Secondary | ICD-10-CM | POA: Diagnosis not present

## 2020-11-02 NOTE — Progress Notes (Signed)
Daily Session Note  Patient Details  Name: [name] [name]. MRN: [id number] Date of Birth: [birth year] Referring Provider:   Flowsheet Row Pulmonary Rehab from 09/15/2020 in [facility] Cardiac and Pulmonary Rehab  Referring Provider Baltazar Apo MD      Encounter Date: 11/02/2020  Check In:  Session Check In - 11/02/20 1358      Check-In   Supervising physician immediately available to respond to emergencies See telemetry face sheet for immediately available ER MD    Location [facility]    Staff Present Birdie Sons, MPA, RN;Meredith Sherryll Burger, RN Margurite Auerbach, MS Exercise Physiologist    Virtual Visit No    Medication changes reported     No    Fall or balance concerns reported    No    Warm-up and Cool-down Performed on first and last piece of equipment    Resistance Training Performed Yes    VAD Patient? No    PAD/SET Patient? No      Pain Assessment   Currently in Pain? No/denies              Social History   Tobacco Use  Smoking Status Former Smoker  . Packs/day: 0.75  . Years: 67.00  . Pack years: 50.25  . Types: Cigarettes  . Start date: 58  . Quit date: 07/13/2020  . Years since quitting: 0.3  Smokeless Tobacco Never Used  Tobacco Comment   Recent Quit      Goals Met:  Independence with exercise equipment Exercise tolerated well No report of cardiac concerns or symptoms Strength training completed today  Goals Unmet:  Not Applicable  Comments: Pt able to follow exercise prescription today without complaint.  Will continue to monitor for progression.    Dr. Emily Filbert is Medical Director for [facility] and [facility].

## 2020-11-03 ENCOUNTER — Other Ambulatory Visit: Payer: Self-pay

## 2020-11-03 ENCOUNTER — Encounter: Payer: Medicare Other | Admitting: *Deleted

## 2020-11-03 DIAGNOSIS — J431 Panlobular emphysema: Secondary | ICD-10-CM | POA: Diagnosis not present

## 2020-11-03 NOTE — Progress Notes (Signed)
Daily Session Note  Patient Details  Name: [name] [name]. MRN: [id number] Date of Birth: 1940/12/26 Referring Provider:   Flowsheet Row Pulmonary Rehab from 09/15/2020 in [facility] Cardiac and Pulmonary Rehab  Referring Provider Baltazar Apo MD      Encounter Date: 11/03/2020  Check In:  Session Check In - 11/03/20 1400      Check-In   Supervising physician immediately available to respond to emergencies See telemetry face sheet for immediately available ER MD    Location [facility] Re[facility]    Staff Present Renita Papa, RN BSN;Joseph [address] [place], [place], [place], CCRP, CCET    Virtual Visit No    Medication changes reported     No    Fall or balance concerns reported    No    Warm-up and Cool-down Performed on first and last piece of equipment    Resistance Training Performed Yes    VAD Patient? No    PAD/SET Patient? No      Pain Assessment   Currently in Pain? No/denies              Social History   Tobacco Use  Smoking Status Former Smoker  . Packs/day: 0.75  . Years: 67.00  . Pack years: 50.25  . Types: Cigarettes  . Start date: 27  . Quit date: 07/13/2020  . Years since quitting: 0.3  Smokeless Tobacco Never Used  Tobacco Comment   Recent Quit      Goals Met:  Independence with exercise equipment Exercise tolerated well No report of cardiac concerns or symptoms Strength training completed today  Goals Unmet:  Not Applicable  Comments: Pt able to follow exercise prescription today without complaint.  Will continue to monitor for progression.    Dr. Emily Filbert is Medical Director for [place] and [facility].

## 2020-11-09 ENCOUNTER — Other Ambulatory Visit: Payer: Self-pay

## 2020-11-09 DIAGNOSIS — J431 Panlobular emphysema: Secondary | ICD-10-CM | POA: Diagnosis not present

## 2020-11-09 NOTE — Progress Notes (Signed)
Daily Session Note  Patient Details  Name: Sawyer Mentzer. MRN: [id number] Date of Birth: 09-17-1940 Referring Provider:   Flowsheet Row Pulmonary Rehab from 09/15/2020 in [facility] Cardiac and Pulmonary Rehab  Referring Provider Baltazar Apo MD      Encounter Date: 11/09/2020  Check In:  Session Check In - 11/09/20 1412      Check-In   Supervising physician immediately available to respond to emergencies See telemetry face sheet for immediately available ER MD    Location [facility] Pul[facility] Re[facility]    Staff Present Birdie Sons, MPA, RN;Joseph Lou Miner, MS Exercise Physiologist    Virtual Visit No    Medication changes reported     No    Fall or balance concerns reported    No    Warm-up and Cool-down Performed on first and last piece of equipment    Resistance Training Performed Yes    VAD Patient? No    PAD/SET Patient? No      Pain Assessment   Currently in Pain? No/denies              Social History   Tobacco Use  Smoking Status Former Smoker  . Packs/day: 0.75  . Years: 67.00  . Pack years: 50.25  . Types: Cigarettes  . Start date: 21  . Quit date: 07/13/2020  . Years since quitting: 0.3  Smokeless Tobacco Never Used  Tobacco Comment   Recent Quit      Goals Met:  Independence with exercise equipment Exercise tolerated well No report of cardiac concerns or symptoms Strength training completed today  Goals Unmet:  Not Applicable  Comments: Pt able to follow exercise prescription today without complaint.  Will continue to monitor for progression.    Dr. Emily Filbert is Medical Director for [place] and [facility].

## 2020-11-10 ENCOUNTER — Other Ambulatory Visit: Payer: Self-pay

## 2020-11-10 ENCOUNTER — Encounter: Payer: Medicare Other | Admitting: *Deleted

## 2020-11-10 DIAGNOSIS — J431 Panlobular emphysema: Secondary | ICD-10-CM | POA: Diagnosis not present

## 2020-11-10 NOTE — Progress Notes (Signed)
Daily Session Note  Patient Details  Name: [name] [name]. MRN: [id number] Date of Birth: [birth year] Referring Provider:   Flowsheet Row Pulmonary Rehab from 09/15/2020 in [facility] Cardiac and Pulmonary Rehab  Referring Provider Baltazar Apo MD      Encounter Date: 11/10/2020  Check In:  Session Check In - 11/10/20 1401      Check-In   Supervising physician immediately available to respond to emergencies See telemetry face sheet for immediately available ER MD    Location [facility] Pul[facility] Re[facility]    Staff Present Renita Papa, RN BSN;Joseph [address] [place], [place], [place], CCRP, CCET    Virtual Visit No    Medication changes reported     No    Fall or balance concerns reported    No    Warm-up and Cool-down Performed on first and last piece of equipment    Resistance Training Performed Yes    VAD Patient? No    PAD/SET Patient? No      Pain Assessment   Currently in Pain? No/denies              Social History   Tobacco Use  Smoking Status Former Smoker  . Packs/day: 0.75  . Years: 67.00  . Pack years: 50.25  . Types: Cigarettes  . Start date: 68  . Quit date: 07/13/2020  . Years since quitting: 0.3  Smokeless Tobacco Never Used  Tobacco Comment   Recent Quit      Goals Met:  Independence with exercise equipment Exercise tolerated well No report of cardiac concerns or symptoms Strength training completed today  Goals Unmet:  Not Applicable  Comments: Pt able to follow exercise prescription today without complaint.  Will continue to monitor for progression.    Dr. Emily Filbert is Medical Director for [place] and [facility].

## 2020-11-14 ENCOUNTER — Other Ambulatory Visit: Payer: Self-pay

## 2020-11-14 ENCOUNTER — Encounter: Payer: Medicare Other | Admitting: *Deleted

## 2020-11-14 DIAGNOSIS — J431 Panlobular emphysema: Secondary | ICD-10-CM | POA: Diagnosis not present

## 2020-11-14 DIAGNOSIS — J9611 Chronic respiratory failure with hypoxia: Secondary | ICD-10-CM | POA: Diagnosis not present

## 2020-11-14 DIAGNOSIS — J449 Chronic obstructive pulmonary disease, unspecified: Secondary | ICD-10-CM | POA: Diagnosis not present

## 2020-11-14 NOTE — Progress Notes (Signed)
Daily Session Note  Patient Details  Name: [name] [name]. MRN: [id number] Date of Birth: March 16, 1941 Referring Provider:   Flowsheet Row Pulmonary Rehab from 09/15/2020 in [facility] Cardiac and Pulmonary Rehab  Referring Provider Baltazar Apo MD      Encounter Date: 11/14/2020  Check In:  Session Check In - 11/14/20 1447      Check-In   Supervising physician immediately available to respond to emergencies See telemetry face sheet for immediately available ER MD    Location [facility]Card[facility] & Pulmonary Rehab    Staff Present Heath Lark, RN, BSN, Laveda Norman, BS, ACSM CEP, Exercise Physiologist;Joseph Tessie Fass RCP,RRT,BSRT    Virtual Visit No    Medication changes reported     No    Fall or balance concerns reported    No    Warm-up and Cool-down Performed on first and last piece of equipment    Resistance Training Performed Yes    VAD Patient? No    PAD/SET Patient? No      Pain Assessment   Currently in Pain? No/denies              Social History   Tobacco Use  Smoking Status Former Smoker  . Packs/day: 0.75  . Years: 67.00  . Pack years: 50.25  . Types: Cigarettes  . Start date: 76  . Quit date: 07/13/2020  . Years since quitting: 0.3  Smokeless Tobacco Never Used  Tobacco Comment   Recent Quit      Goals Met:  Proper associated with RPD/PD & O2 Sat Independence with exercise equipment Exercise tolerated well No report of cardiac concerns or symptoms  Goals Unmet:  Not Applicable  Comments: Pt able to follow exercise prescription today without complaint.  Will continue to monitor for progression.    Dr. Emily Filbert is Medical Director for [place] and [facility].

## 2020-11-16 ENCOUNTER — Other Ambulatory Visit: Payer: Self-pay

## 2020-11-16 ENCOUNTER — Encounter: Payer: Self-pay | Admitting: *Deleted

## 2020-11-16 ENCOUNTER — Encounter: Payer: Medicare Other | Admitting: *Deleted

## 2020-11-16 DIAGNOSIS — J431 Panlobular emphysema: Secondary | ICD-10-CM

## 2020-11-16 NOTE — Progress Notes (Signed)
Daily Session Note  Patient Details  Name: [name] [name]. MRN: [id number] Date of Birth: 1941/02/23 Referring Provider:   Flowsheet Row Pulmonary Rehab from 09/15/2020 in [facility] Cardiac and Pulmonary Rehab  Referring Provider Baltazar Apo MD      Encounter Date: 11/16/2020  Check In:  Session Check In - 11/16/20 1408      Check-In   Supervising physician immediately available to respond to emergencies See telemetry face sheet for immediately available ER MD    Location [facility] Pulmonary Re[facility]    Staff Present Renita Papa, RN BSN;Joseph Lou Miner, [place] Exercise Physiologist    Virtual Visit No    Medication changes reported     No    Fall or balance concerns reported    No    Warm-up and Cool-down Performed on first and last piece of equipment    Resistance Training Performed Yes    VAD Patient? No    PAD/SET Patient? No      Pain Assessment   Currently in Pain? No/denies              Social History   Tobacco Use  Smoking Status Former Smoker  . Packs/day: 0.75  . Years: 67.00  . Pack years: 50.25  . Types: Cigarettes  . Start date: 72  . Quit date: 07/13/2020  . Years since quitting: 0.3  Smokeless Tobacco Never Used  Tobacco Comment   Recent Quit      Goals Met:  Independence with exercise equipment Exercise tolerated well No report of cardiac concerns or symptoms Strength training completed today  Goals Unmet:  Not Applicable  Comments: Pt able to follow exercise prescription today without complaint.  Will continue to monitor for progression.    Dr. Emily Filbert is Medical Director for [place] and [facility].

## 2020-11-16 NOTE — Progress Notes (Signed)
Pulmonary Individual Treatment Plan  Patient Details  Name: [name] [name]. 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  albuterol (PROVENTIL) (2.5 MG/3ML) 0.083% nebulizer solution, Take 3 mLs (2.5 mg total) by nebulization every 6 (six) hours as needed for wheezing or shortness of breath., Disp: 150 mL, Rfl: 5 .  Budeson-Glycopyrrol-Formoterol (BREZTRI AEROSPHERE) 160-9-4.8 MCG/ACT AERO, Inhale 2 puffs into the lungs 2 (two) times daily., Disp: 4.8 g, Rfl: 5 .  Budeson-Glycopyrrol-Formoterol (BREZTRI AEROSPHERE) 160-9-4.8 MCG/ACT AERO, Inhale 2 puffs into the lungs 2 (two) times daily., Disp: 10.7 g, Rfl: 0 .  cholecalciferol (VITAMIN D3) 25 MCG (1000 UNIT) tablet, Take 1,000 Units by mouth 3 (three) times a week. , Disp: , Rfl:  .  clopidogrel (PLAVIX) 75 MG tablet, Take 1 tablet (75 mg total) by mouth daily. Ok to restart this medication on Wednesday 04/13/20., Disp: 90 tablet, Rfl: 1 .  esomeprazole (NEXIUM) 20 MG capsule, Take 20 mg by mouth daily. , Disp: , Rfl:  .  Ipratropium-Albuterol (COMBIVENT RESPIMAT) 20-100 MCG/ACT AERS respimat, Inhale 1-2 puffs into the lungs every 6 (six) hours as needed for wheezing or shortness of breath., Disp: , Rfl:  .  ketoconazole (NIZORAL) 2 % cream, Apply 1 application topically daily., Disp: , Rfl:  .  Melatonin 5 MG CAPS, Take 5 mg by mouth at bedtime., Disp: , Rfl:  .  nitroGLYCERIN (NITROSTAT) 0.4 MG SL tablet, Place 1 tablet (0.4 mg total) under the tongue every 5 (five) minutes as needed for chest pain., Disp: 25 tablet, Rfl: 11 .  predniSONE  (DELTASONE) 5 MG tablet, Take 5 mg by mouth daily., Disp: , Rfl:  .  rosuvastatin (CRESTOR) 20 MG tablet, TAKE 1 TABLET BY MOUTH EVERYDAY AT BEDTIME, Disp: 90 tablet, Rfl: 1 .  tamsulosin (FLOMAX) 0.4 MG CAPS capsule, 1 capsule, Disp: , Rfl:  No current facility-administered medications for this visit.  Facility-Administered Medications Ordered in Other Visits:  .  thallous chloride (201 THALLIUM) injection 4 milli Curie, 4 millicurie, Intravenous, Once PRN, Larey Dresser, MD  Past Medical History: Past Medical History:  Diagnosis Date  . Acquired trigger finger of right middle finger 02/04/2020  . Acute prostatitis 04/08/2018  . Alcohol abuse   . Anal or rectal pain 04/08/2018  . Arthritis   . Atherosclerotic heart disease of native coronary artery without angina pectoris 04/08/2018  . BPH with elevated PSA   . 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 Chronic respiratory failure with hypoxia ([place]) 08/10/2019  . Cigarette nicotine dependence, uncomplicated 5/88/5027  . Coagulation disorder ([place]) 01/13/2019  . 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 COPD (chronic obstructive pulmonary disease) ([place])   . Corns and callosities 04/09/2018  . Coronary artery disease    2019 with stents  . Coronary atherosclerosis 05/07/2018  . Diarrhea 04/09/2018  . 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 Dysphagia 08/14/2018  . Dyspnea   . ED (erectile dysfunction)   . Elevated prostate specific antigen (PSA) 04/08/2018  . Elevated PSA   . Enlarged prostate without lower urinary tract symptoms (luts)  04/09/2018  . Essential (primary) hypertension 04/08/2018  . Ex-smoker 04/09/2018  . Exertional dyspnea 04/02/2018  . 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 Flatulence 04/08/2018  . Gastro-esophageal reflux disease without esophagitis 04/08/2018  . GERD (gastroesophageal reflux disease)   . History of acute otitis externa 08/14/2018  . Hyperlipidemia   . Hypertension   . Kidney cysts   . Low  back pain 04/08/2018  . Male erectile dysfunction 04/09/2018  . Mixed hyperlipidemia 04/08/2018  . Nocturia 04/09/2018  . Osteoarthritis of first carpometacarpal joint 04/08/2018  . Pain due to onychomycosis of toenail of left foot 07/20/2019  . Pain in joint 04/09/2018  . Pain in knee 04/09/2018  . Pain of finger 04/09/2018  . Palpitations   . Pneumonia   . Pneumothorax 04/12/2020  . Polypharmacy 04/09/2018  . Porokeratosis 01/13/2019  . Pulmonary nodules 06/08/2016  . Sleep disorder 04/08/2018  . Status post bronchoscopy 04/12/2020  . Tear of rotator cuff 04/09/2018  . Uncomplicated alcohol dependence ([place]) 04/09/2018  . Vitamin D deficiency 04/08/2018    Tobacco Use: Social History   Tobacco Use  Smoking Status Former Smoker  . Packs/day: 0.75  . Years: 67.00  . Pack years: 50.25  . Types: Cigarettes  . Start date: 59  . Quit date: 07/13/2020  . Years since quitting: 0.3  Smokeless Tobacco Never Used  Tobacco Comment   Recent Quit      Labs: Recent Review Flowsheet Data    Labs for ITP Cardiac and Pulmonary Rehab Latest Ref Rng & Units 06/06/2018   Cholestrol 100 - 199 mg/dL 122   LDLCALC 0 - 99 mg/dL 50   HDL >39 mg/dL 61   Trlycerides 0 - 149 mg/dL 54       Pulmonary Assessment Scores:  Pulmonary Assessment Scores    Row Name 09/15/20 1347         ADL UCSD   ADL Phase Entry     SOB Score total 56     Rest 0     Walk 2     Stairs 4     Bath 3     Dress 2     Shop 3           CAT Score   CAT Score 16           mMRC Score   mMRC Score 3            UCSD: Self-administered rating of dyspnea associated with activities of daily living (ADLs) 6-point scale (0 = "not at all" to 5 = "maximal or unable to do because of breathlessness")  Scoring Scores range from 0 to 120.  Minimally important difference is 5 units  CAT: CAT can identify the health impairment of COPD patients and is better correlated with disease progression.  CAT has a scoring range of  zero to 40. The CAT score is classified into four groups of low (less than 10), medium (10 - 20), high (21-30) and very high (31-40) based on the impact level of disease on health status. A CAT score over 10 suggests significant symptoms.  A worsening CAT score could be explained by an exacerbation, poor medication adherence, poor inhaler technique, or progression of COPD or comorbid conditions.  CAT MCID is 2 points  mMRC: mMRC (Modified Medical Research Council) Dyspnea Scale is used to assess the degree of baseline functional disability in patients of respiratory disease due to dyspnea. No minimal important difference is established. A decrease  in score of 1 point or greater is considered a positive change.   Pulmonary Function Assessment:  Pulmonary Function Assessment - 09/15/20 1347      Breath   Shortness of Breath Yes;Fear of Shortness of Breath;Limiting activity           Exercise Target Goals: Exercise Program Goal: Individual exercise prescription set using results from initial 6 min walk test and THRR while considering  patient's activity barriers and safety.   Exercise Prescription Goal: Initial exercise prescription builds to 30-45 minutes a day of aerobic activity, 2-3 days per week.  Home exercise guidelines will be given to patient during program as part of exercise prescription that the participant will acknowledge.  Education: Aerobic Exercise: - Group verbal and visual presentation on the components of exercise prescription. Introduces F.I.T.T principle from ACSM for exercise prescriptions.  Reviews F.I.T.T. principles of aerobic exercise including progression. Written material given at graduation.   Education: Resistance Exercise: - Group verbal and visual presentation on the components of exercise prescription. Introduces F.I.T.T principle from ACSM for exercise prescriptions  Reviews F.I.T.T. principles of resistance exercise including progression. Written material  given at graduation. Flowsheet Row Pulmonary Rehab from 11/09/2020 in [facility] Cardiac and Pulmonary Rehab  Date 09/21/20  Educator AS  Instruction Review Code 1- Verbalizes Understanding       Education: Exercise & Equipment Safety: - Individual verbal instruction and demonstration of equipment use and safety with use of the equipment. Flowsheet Row Pulmonary Rehab from 11/09/2020 in [facility] Cardiac and Pulmonary Rehab  Date 09/15/20  Educator [facility]  Instruction Review Code 1- Verbalizes Understanding      Education: Exercise Physiology & General Exercise Guidelines: - Group verbal and written instruction with models to review the exercise physiology of the cardiovascular system and associated critical values. Provides general exercise guidelines with specific guidelines to those with heart or lung disease.  Flowsheet Row Pulmonary Rehab from 11/09/2020 in [facility] Cardiac and Pulmonary Rehab  Date 11/09/20  Educator AS  Instruction Review Code 1- Verbalizes Understanding      Education: Flexibility, Balance, Mind/Body Relaxation: - Group verbal and visual presentation with interactive activity on the components of exercise prescription. Introduces F.I.T.T principle from ACSM for exercise prescriptions. Reviews F.I.T.T. principles of flexibility and balance exercise training including progression. Also discusses the mind body connection.  Reviews various relaxation techniques to help reduce and manage stress (i.e. Deep breathing, progressive muscle relaxation, and visualization). Balance handout provided to take home. Written material given at graduation. Flowsheet Row Pulmonary Rehab from 11/09/2020 in [facility] Cardiac and Pulmonary Rehab  Date 09/28/20  Educator AS  Instruction Review Code 1- Verbalizes Understanding      Activity Barriers & Risk Stratification:  Activity Barriers & Cardiac Risk Stratification - 09/15/20 1339      Activity Barriers & Cardiac Risk Stratification   Activity  Barriers Shortness of Breath;Balance Concerns;Other (comment);Deconditioning;Muscular Weakness    Comments minimizes walking because of SOB.           6 Minute Walk:  6 Minute Walk    Row Name 09/15/20 1336         6 Minute Walk   Phase Initial     Distance 580 feet     Walk Time 4.18 minutes     # of Rest Breaks 2  16 sec, 1:33     MPH 1.58     METS 1.74     RPE 15     Perceived Dyspnea  3  VO2 Peak 6.04     Symptoms Yes (comment)     Comments SOB, leg fatigue     Resting HR 85 bpm     Resting BP 126/64     Resting Oxygen Saturation  99 %     Exercise Oxygen Saturation  during 6 min walk 92 %     Max Ex. HR 98 bpm     Max Ex. BP 156/74     2 Minute Post BP 136/74           Interval HR   1 Minute HR 85     2 Minute HR 82     3 Minute HR 90     4 Minute HR 94     5 Minute HR 98     6 Minute HR 83     2 Minute Post HR 94     Interval Heart Rate? Yes           Interval Oxygen   Interval Oxygen? Yes     Baseline Oxygen Saturation % 99 %     1 Minute Oxygen Saturation % 95 %     1 Minute Liters of Oxygen 4 L  pulsed     2 Minute Oxygen Saturation % 94 %     2 Minute Liters of Oxygen 4 L     3 Minute Oxygen Saturation % 91 %     3 Minute Liters of Oxygen 5 L     4 Minute Oxygen Saturation % 92 %  seated     4 Minute Liters of Oxygen 5 L     5 Minute Oxygen Saturation % 93 %     5 Minute Liters of Oxygen 5 L     6 Minute Oxygen Saturation % 92 %     6 Minute Liters of Oxygen 5 L     2 Minute Post Oxygen Saturation % 96 %     2 Minute Post Liters of Oxygen 4 L           Oxygen Initial Assessment:  Oxygen Initial Assessment - 09/15/20 1346      Home Oxygen   Home Oxygen Device Portable Concentrator    Sleep Oxygen Prescription Pulsed    Liters per minute 2    Home Exercise Oxygen Prescription Pulsed    Liters per minute 4    Home Resting Oxygen Prescription None    Compliance with Home Oxygen Use Yes      Initial 6 min Walk   Oxygen Used  Pulsed;Portable Concentrator    Liters per minute 4      Program Oxygen Prescription   Program Oxygen Prescription Continuous;E-Tanks    Liters per minute 4      Intervention   Short Term Goals To learn and exhibit compliance with exercise, home and travel O2 prescription;To learn and understand importance of monitoring SPO2 with pulse oximeter and demonstrate accurate use of the pulse oximeter.;To learn and understand importance of maintaining oxygen saturations>88%;To learn and demonstrate proper pursed lip breathing techniques or other breathing techniques.;To learn and demonstrate proper use of respiratory medications    Long  Term Goals Exhibits compliance with exercise, home and travel O2 prescription;Verbalizes importance of monitoring SPO2 with pulse oximeter and return demonstration;Maintenance of O2 saturations>88%;Exhibits proper breathing techniques, such as pursed lip breathing or other method taught during program session;Compliance with respiratory medication;Demonstrates proper use of MDI's           Oxygen  Re-Evaluation:  Oxygen Re-Evaluation    Row Name 09/19/20 1411 10/05/20 1433 11/02/20 1420         Program Oxygen Prescription   Program Oxygen Prescription Continuous;E-Tanks E-Tanks;Continuous E-Tanks;Continuous     Liters per minute _0 Home Oxygen   Home Oxygen Device Portable Concentrator Portable Concentrator Portable Concentrator;Home Concentrator     Sleep Oxygen Prescription Pulsed None None     Liters per minute 2 -- 2     Home Exercise Oxygen Prescription Pulsed Pulsed Pulsed     Liters per minute _1 Home Resting Oxygen Prescription None None None     Compliance with Home Oxygen Use Yes Yes Yes           Goals/Expected Outcomes   Short Term Goals To learn and exhibit compliance with exercise, home and travel O2 prescription;To learn and understand importance of monitoring SPO2 with pulse oximeter and demonstrate accurate use of  the pulse oximeter.;To learn and understand importance of maintaining oxygen saturations>88%;To learn and demonstrate proper pursed lip breathing techniques or other breathing techniques. To learn and exhibit compliance with exercise, home and travel O2 prescription;To learn and understand importance of monitoring SPO2 with pulse oximeter and demonstrate accurate use of the pulse oximeter.;To learn and understand importance of maintaining oxygen saturations>88%;To learn and demonstrate proper pursed lip breathing techniques or other breathing techniques.;To learn and demonstrate proper use of respiratory medications To learn and exhibit compliance with exercise, home and travel O2 prescription;To learn and understand importance of monitoring SPO2 with pulse oximeter and demonstrate accurate use of the pulse oximeter.;To learn and understand importance of maintaining oxygen saturations>88%;To learn and demonstrate proper pursed lip breathing techniques or other breathing techniques.;To learn and demonstrate proper use of respiratory medications     Long  Term Goals Exhibits compliance with exercise, home and travel O2 prescription;Verbalizes importance of monitoring SPO2 with pulse oximeter and return demonstration;Maintenance of O2 saturations>88%;Exhibits proper breathing techniques, such as pursed lip breathing or other method taught during program session Exhibits compliance with exercise, home and travel O2 prescription;Verbalizes importance of monitoring SPO2 with pulse oximeter and return demonstration;Maintenance of O2 saturations>88%;Exhibits proper breathing techniques, such as pursed lip breathing or other method taught during program session;Compliance with respiratory medication;Demonstrates proper use of MDI's Exhibits compliance with exercise, home and travel O2 prescription;Verbalizes importance of monitoring SPO2 with pulse oximeter and return demonstration;Maintenance of O2  saturations>88%;Exhibits proper breathing techniques, such as pursed lip breathing or other method taught during program session;Compliance with respiratory medication;Demonstrates proper use of MDI's     Comments Reviewed PLB technique with pt.  Talked about how it works and it's importance in maintaining their exercise saturations. Hal states that he has been having alot more shortness of breath the past week. Went over his respiratory medications and he states that he does not have an inhaler for any of his medications. Gave patient a spacer and informed him how to use it. Patient will get back to staff if he feels a difference in his breathing next week. Two of his medications can be used with his spacer. Hal is doing well with his breathing.  He feels that after he exercises his breathing improves temporarily. Overall, it seems to be getting better.  He is using the spacer we gave him to help.  He has also gotten better at using his PLB.     Goals/Expected Outcomes Short: Become  more profiecient at using PLB.   Long: Become independent at using PLB. Short: use spacer with his medications. Long: maintain taking his medication independently with spacer. Short: Continue to use oxygen at home for activity Long; Continue to manage breathing            Oxygen Discharge (Final Oxygen Re-Evaluation):  Oxygen Re-Evaluation - 11/02/20 1420      Program Oxygen Prescription   Program Oxygen Prescription E-Tanks;Continuous    Liters per minute 4      Home Oxygen   Home Oxygen Device Portable Concentrator;Home Concentrator    Sleep Oxygen Prescription None    Liters per minute 2    Home Exercise Oxygen Prescription Pulsed    Liters per minute 3    Home Resting Oxygen Prescription None    Compliance with Home Oxygen Use Yes      Goals/Expected Outcomes   Short Term Goals To learn and exhibit compliance with exercise, home and travel O2 prescription;To learn and understand importance of monitoring SPO2  with pulse oximeter and demonstrate accurate use of the pulse oximeter.;To learn and understand importance of maintaining oxygen saturations>88%;To learn and demonstrate proper pursed lip breathing techniques or other breathing techniques.;To learn and demonstrate proper use of respiratory medications    Long  Term Goals Exhibits compliance with exercise, home and travel O2 prescription;Verbalizes importance of monitoring SPO2 with pulse oximeter and return demonstration;Maintenance of O2 saturations>88%;Exhibits proper breathing techniques, such as pursed lip breathing or other method taught during program session;Compliance with respiratory medication;Demonstrates proper use of MDI's    Comments Hal is doing well with his breathing.  He feels that after he exercises his breathing improves temporarily. Overall, it seems to be getting better.  He is using the spacer we gave him to help.  He has also gotten better at using his PLB.    Goals/Expected Outcomes Short: Continue to use oxygen at home for activity Long; Continue to manage breathing           Initial Exercise Prescription:  Initial Exercise Prescription - 09/15/20 1300      Date of Initial Exercise RX and Referring Provider   Date 09/15/20    Referring Provider Baltazar Apo MD      Oxygen   Oxygen Continuous    Liters 4      Treadmill   MPH 1.3    Grade 0.5    Minutes 15    METs 2.09      Recumbant Bike   Level 1    RPM 50    Watts 6    Minutes 15    METs 2      Arm Ergometer   Level 1    Watts 18    RPM 25    Minutes 15    METs 2      REL-XR   Level 1    Speed 50    Minutes 15    METs 2      Prescription Details   Frequency (times per week) 3    Duration Progress to 30 minutes of continuous aerobic without signs/symptoms of physical distress      Intensity   THRR 40-80% of Max Heartrate 107-130    Ratings of Perceived Exertion 11-13    Perceived Dyspnea 0-4      Progression   Progression Continue  to progress workloads to maintain intensity without signs/symptoms of physical distress.      Resistance Training   Training Prescription Yes  Weight 3 lb    Reps 10-15           Perform Capillary Blood Glucose checks as needed.  Exercise Prescription Changes:  Exercise Prescription Changes    Row Name 09/15/20 1300 10/03/20 1600 10/17/20 1000 11/09/20 1500       Response to Exercise   Blood Pressure (Admit) 126/64 132/60 104/58 --    Blood Pressure (Exercise) 156/74 138/68 132/60 --    Blood Pressure (Exit) 122/68 134/70 98/60 --    Heart Rate (Admit) 85 bpm 82 bpm 93 bpm --    Heart Rate (Exercise) 98 bpm 86 bpm 96 bpm --    Heart Rate (Exit) 87 bpm 83 bpm 93 bpm --    Oxygen Saturation (Admit) 99 % 97 % 99 % --    Oxygen Saturation (Exercise) 91 % 96 % 94 % --    Oxygen Saturation (Exit) 99 % 100 % 98 % --    Rating of Perceived Exertion (Exercise) _0 --    Perceived Dyspnea (Exercise) _1 --    Symptoms SOB, leg fatigue SOB SOB --    Comments walk test results -- -- --    Duration -- Continue with 30 min of aerobic exercise without signs/symptoms of physical distress. Continue with 30 min of aerobic exercise without signs/symptoms of physical distress. --    Intensity -- THRR unchanged THRR unchanged --         Progression   Progression -- Continue to progress workloads to maintain intensity without signs/symptoms of physical distress. Continue to progress workloads to maintain intensity without signs/symptoms of physical distress. --    Average METs -- 1.99 2.27 --         Resistance Training   Training Prescription -- Yes Yes --    Weight -- 3 lb 3 lb --    Reps -- 10-15 10-15 --         Interval Training   Interval Training -- No No --         Oxygen   Oxygen -- Continuous Continuous --    Liters -- 4 4 --         Recumbant Bike   Level -- 2 3 --    Watts -- 16 -- --    Minutes -- 15 15 --    METs -- 2.67 3.14 --         NuStep   Level  -- 1 1 --    Minutes -- 15 15 --    METs -- 1.5 1.4 --         Arm Ergometer   Level -- 1 -- --    Minutes -- 115 -- --    METs -- 1.8 -- --         Home Exercise Plan   Plans to continue exercise at -- -- -- [employer] (comment)  [place]    Frequency -- -- -- Add 1 additional day to program exercise sessions.  Add on 2nd day after    Initial Home Exercises Provided -- -- -- 11/09/20           Exercise Comments:  Exercise Comments    Row Name 09/19/20 1410           Exercise Comments First full day of exercise!  Patient was oriented to gym and equipment including functions, settings, policies, and procedures.  Patient's individual exercise prescription and treatment plan were reviewed.  All  starting workloads were established based on the results of the 6 minute walk test done at initial orientation visit.  The plan for exercise progression was also introduced and progression will be customized based on patient's performance and goals.              Exercise Goals and Review:  Exercise Goals    Row Name 09/15/20 1342             Exercise Goals   Increase Physical Activity Yes       Intervention Provide advice, education, support and counseling about physical activity/exercise needs.;Develop an individualized exercise prescription for aerobic and resistive training based on initial evaluation findings, risk stratification, comorbidities and participant's personal goals.       Expected Outcomes Short Term: Attend rehab on a regular basis to increase amount of physical activity.;Long Term: Add in home exercise to make exercise part of routine and to increase amount of physical activity.;Long Term: Exercising regularly at least 3-5 days a week.       Increase Strength and Stamina Yes       Intervention Provide advice, education, support and counseling about physical activity/exercise needs.;Develop an individualized exercise prescription for aerobic and resistive  training based on initial evaluation findings, risk stratification, comorbidities and participant's personal goals.       Expected Outcomes Short Term: Perform resistance training exercises routinely during rehab and add in resistance training at home;Short Term: Increase workloads from initial exercise prescription for resistance, speed, and METs.;Long Term: Improve cardiorespiratory fitness, muscular endurance and strength as measured by increased METs and functional capacity (6MWT)       Able to understand and use rate of perceived exertion (RPE) scale Yes       Intervention Provide education and explanation on how to use RPE scale       Expected Outcomes Short Term: Able to use RPE daily in rehab to express subjective intensity level;Long Term:  Able to use RPE to guide intensity level when exercising independently       Able to understand and use Dyspnea scale Yes       Intervention Provide education and explanation on how to use Dyspnea scale       Expected Outcomes Short Term: Able to use Dyspnea scale daily in rehab to express subjective sense of shortness of breath during exertion;Long Term: Able to use Dyspnea scale to guide intensity level when exercising independently       Knowledge and understanding of Target Heart Rate Range (THRR) Yes       Intervention Provide education and explanation of THRR including how the numbers were predicted and where they are located for reference       Expected Outcomes Short Term: Able to state/look up THRR;Short Term: Able to use daily as guideline for intensity in rehab;Long Term: Able to use THRR to govern intensity when exercising independently       Able to check pulse independently Yes       Intervention Provide education and demonstration on how to check pulse in carotid and radial arteries.;Review the importance of being able to check your own pulse for safety during independent exercise       Expected Outcomes Short Term: Able to explain why pulse  checking is important during independent exercise;Long Term: Able to check pulse independently and accurately       Understanding of Exercise Prescription Yes       Intervention Provide education, explanation, and written materials on  patient's individual exercise prescription       Expected Outcomes Short Term: Able to explain program exercise prescription;Long Term: Able to explain home exercise prescription to exercise independently              Exercise Goals Re-Evaluation :  Exercise Goals Re-Evaluation    Row Name 09/19/20 1410 10/03/20 1611 10/05/20 1442 10/17/20 1043 11/02/20 1419     Exercise Goal Re-Evaluation   Exercise Goals Review Increase Physical Activity;Able to understand and use rate of perceived exertion (RPE) scale;Knowledge and understanding of Target Heart Rate Range (THRR);Understanding of Exercise Prescription;Increase Strength and Stamina;Able to understand and use Dyspnea scale;Able to check pulse independently Increase Physical Activity;Increase Strength and Stamina;Understanding of Exercise Prescription Understanding of Exercise Prescription;Increase Physical Activity;Increase Strength and Stamina Increase Physical Activity;Increase Strength and Stamina Increase Physical Activity;Increase Strength and Stamina;Understanding of Exercise Prescription   Comments Reviewed RPE and dyspnea scales, THR and program prescription with pt today.  Pt voiced understanding and was given a copy of goals to take home. Hal is off to a good start in rehab.  He is now up to 16 watts on the bike.  We will continue to monitor his progress. Hal is going to go the they gym at [facility] where he and his wife live after the program is over. He is doing well maintaining his shortness of breath in the program. He rest when he needs but has to work on PLB when he is exercising more. Hal is tolerating exercise well.  Hs oxygen has stayed in mid to upper 90s on 4L continuous. Hal is doing well in rehab.   He has met with the director at [facility] to help get them to supply continuous flow oxygen in the gym for those that can only get pulsed for out and about. He is feeling better overall.   Expected Outcomes Short: Use RPE daily to regulate intensity. Long: Follow program prescription in THR. Short: Continue to attend regularly Long: Continue to follow program prescription Short: work on PLB while exercising. Long: PLB when exercising independently. Short: try to increae levels on machines Long: improve overall stamina Short: Continue to make plans for exercising at home Long: Continue to improve stamina.   [place] Name 11/09/20 1518             Exercise Goal Re-Evaluation   Comments Reviewed home exercise with pt today.  Pt plans to attend his gym facility at [facility] for exercise.  Reviewed THR, pulse, RPE, sign and symptoms, pulse oximetery and when to call 911 or MD.  Also discussed weather considerations and indoor options.  Pt voiced understanding.              Discharge Exercise Prescription (Final Exercise Prescription Changes):  Exercise Prescription Changes - 11/09/20 1500      Home Exercise Plan   Plans to continue exercise at [facility] (comment)   [place]   Frequency Add 1 additional day to program exercise sessions.   Add on 2nd day after   Initial Home Exercises Provided 11/09/20           Nutrition:  Target Goals: Understanding of nutrition guidelines, daily intake of sodium <1566m, cholesterol <202m calories 30% from fat and 7% or less from saturated fats, daily to have 5 or more servings of fruits and vegetables.  Education: All About Nutrition: -Group instruction provided by verbal, written material, interactive activities, discussions, models, and posters to present general guidelines for heart  healthy nutrition including fat, fiber, MyPlate, the role of sodium in heart healthy nutrition, utilization of the nutrition label, and utilization of this  knowledge for meal planning. Follow up email sent as well. Written material given at graduation. Flowsheet Row Pulmonary Rehab from 11/09/2020 in [facility] Card[facility] Pulmonary Rehab  Date 10/05/20  Educator [facility]  Instruction Review Code 1- Verbalizes Understanding      Biometrics:  Pre Biometrics - 09/15/20 1343      Pre Biometrics   Height 5' 9.75" (1.772 m)    Weight 150 lb 3.2 oz (68.1 kg)    BMI (Calculated) 21.7    Single Leg Stand 2.9 seconds            Nutrition Therapy Plan and Nutrition Goals:  Nutrition Therapy & Goals - 09/21/20 1543      Nutrition Therapy   Diet Pulmonary MNT    Protein (specify units) 82g    Fiber 30 grams    Whole Grain Foods 3 servings    Saturated Fats 12 max. grams    Fruits and Vegetables 8 servings/day    Sodium 1.5 grams      Personal Nutrition Goals   Nutrition Goal ST: add fat and protein to breakfast - butter, nuts/seeds, eggs LT: gain weight - 170lbs    Comments Ensure some of the time. Handful of pistachios. B: english muffin or half of bagel or cold cereal with fruit, egg with bacon and toast 1x/week with coffee and orange juice S: chocolate and pistachios L: cup of soup, sandwich, chips, drink S: milkshake D: meat (variety - fish, meat, chicken), vegetable (green vegetables), starch, salad. S: ice cream  Hal report losing weight everytime he went to the doctor and has been eating more to gaiun weight, he is now relatively weight stable at 150 lbs, but would like to be 170lbs. Discussed pulmonary nutrition including more protein. suggested snacks like peanut butter to add fat and protein, protein with breakfast like boiled eggs and some additional fat.      Intervention Plan   Intervention Prescribe, educate and counsel regarding individualized specific dietary modifications aiming towards targeted core components such as weight, hypertension, lipid management, diabetes, heart failure and other comorbidities.;Nutrition handout(s) given to  patient.    Expected Outcomes Short Term Goal: Understand basic principles of dietary content, such as calories, fat, sodium, cholesterol and nutrients.;Short Term Goal: A plan has been developed with personal nutrition goals set during dietitian appointment.;Long Term Goal: Adherence to prescribed nutrition plan.           Nutrition Assessments:  MEDIFICTS Score Key:  ?70 Need to make dietary changes   40-70 Heart Healthy Diet  ? 40 Therapeutic Level Cholesterol Diet  Flowsheet Row Pulmonary Rehab from 09/15/2020 in [facility] Cardiac and Pulmonary Rehab  Picture Your Plate Total Score on Admission 54     Picture Your Plate Scores:  <92 Unhealthy dietary pattern with much room for improvement.  41-50 Dietary pattern unlikely to meet recommendations for good health and room for improvement.  51-60 More healthful dietary pattern, with some room for improvement.   >60 Healthy dietary pattern, although there may be some specific behaviors that could be improved.   Nutrition Goals Re-Evaluation:  Nutrition Goals Re-Evaluation    [place] Name 11/02/20 1425             Goals   Nutrition Goal ST: add fat and protein to breakfast - butter, nuts/seeds, eggs LT: gain weight - 170lbs  Comment Hal is eating well and trying to get in more protein.  He has taken to drinking shakes and eating protein snacks to hlep.  He is trying to eat more eggs for breakfast to help add in more protein.  He feels that he is eating well.       Expected Outcome Short: Continue to add in protein Long: Continue to work on weight gain.              Nutrition Goals Discharge (Final Nutrition Goals Re-Evaluation):  Nutrition Goals Re-Evaluation - 11/02/20 1425      Goals   Nutrition Goal ST: add fat and protein to breakfast - butter, nuts/seeds, eggs LT: gain weight - 170lbs    Comment Hal is eating well and trying to get in more protein.  He has taken to drinking shakes and eating protein snacks to hlep.   He is trying to eat more eggs for breakfast to help add in more protein.  He feels that he is eating well.    Expected Outcome Short: Continue to add in protein Long: Continue to work on weight gain.           Psychosocial: Target Goals: Acknowledge presence or absence of significant depression and/or stress, maximize coping skills, provide positive support system. Participant is able to verbalize types and ability to use techniques and skills needed for reducing stress and depression.   Education: Stress, Anxiety, and Depression - Group verbal and visual presentation to define topics covered.  Reviews how body is impacted by stress, anxiety, and depression.  Also discusses healthy ways to reduce stress and to treat/manage anxiety and depression.  Written material given at graduation. Flowsheet Row Pulmonary Rehab from 11/09/2020 in [facility] Cardiac and Pulmonary Rehab  Date 11/02/20  Educator [facility]  Instruction Review Code 1- [employer] Understanding      Education: Sleep Hygiene -Provides group verbal and written instruction about how sleep can affect your health.  Define sleep hygiene, discuss sleep cycles and impact of sleep habits. Review good sleep hygiene tips.    Initial Review & Psychosocial Screening:  Initial Psych Review & Screening - 09/12/20 1136      Barriers   Psychosocial barriers to participate in program There are no identifiable barriers or psychosocial needs.      Screening Interventions   Interventions Encouraged to exercise;To provide support and resources with identified psychosocial needs;Provide feedback about the scores to participant           Quality of Life Scores:  Scores of 19 and below usually indicate a poorer quality of life in these areas.  A difference of  2-3 points is a clinically meaningful difference.  A difference of 2-3 points in the total score of the Quality of Life Index has been associated with significant improvement in overall quality of  life, self-image, physical symptoms, and general health in studies assessing change in quality of life.  PHQ-9: Recent Review Flowsheet Data    Depression screen [facility] 2/9 09/15/2020   Decreased Interest 0   Down, Depressed, Hopeless 0   PHQ - 2 Score 0   Altered sleeping 0   Tired, decreased energy 0   Change in appetite 0   Feeling bad or failure about yourself  0   Trouble concentrating 0   Moving slowly or fidgety/restless 0   Suicidal thoughts 0   PHQ-9 Score 0   Difficult doing work/chores Not difficult at all     Interpretation of Total  Score  Total Score Depression Severity:  1-4 = Minimal depression, 5-9 = Mild depression, 10-14 = Moderate depression, 15-19 = Moderately severe depression, 20-27 = Severe depression   Psychosocial Evaluation and Intervention:  Psychosocial Evaluation - 09/12/20 1137      Psychosocial Evaluation & Interventions   Interventions Encouraged to exercise with the program and follow exercise prescription    Comments Tarick has no barriers to entering the program. He lives in a [place] with his wife and their dog. THey had moved to [facility] last year from [facility]. He keeps busy with social activities- a golf group on Sundays which is golf and then a meal. He has not been able to play golf for the past 4-5 months as the course has restrictions on how close a cart can get to the green and his shortness of breath limits his ability to play with the retsitctions.  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 He stays active planning golf outings out of town at least 2 times a year.  He does not usually walk the dog , as his wife does that. He does take to dog to their dog pasrk at [facility] and to the dog park in [place]. He does have a portable oxygen concentrator that he will use if needed during the day. Walking to the car and then off when driving is an example. He quit tobacco in Dec 2021 and feels  confident he will not resume tobacco use. He lives in a smoke free environment. His shortness of breath is limintg to him. He is hoping to see improvement to enable him to participate more in his social activities. He is concerned that he is losing too much weight. He is around 150 pounds from 175 pounds last year. He did find an article that speaks to the calori needs of a patient with COPD and is interested in learning more so he can gain back some weight and stop weight loss.    Expected Outcomes STG: Casey attends all scheduled appointments, he remains tobacco free. He meets with RD about his weight concerns.  LTG: Able to maintian tobacco cessation, has learned about maintianing his weight, ways to improve his SOB and is able to use the tools for improve quality of life           Psychosocial Re-Evaluation:  Psychosocial Re-Evaluation    Row Name 10/05/20 1440 11/02/20 1426           Psychosocial Re-Evaluation   Current issues with Current Stress Concerns Current Stress Concerns      Comments Patient states that he is generally a positve person but sometimes gets down becuase he cannot do the things he use to do such as golfing. His shortness of breath often gets in the way of him being able to do things. Hal is doing well in rehab.  He is usually pretty positive.  He is working with [employer] staff to get oxygen for use at their gym.  He did his research and they are going to look into it more for him.  He is sleeping well.      Expected Outcomes Short: Continue to exercise regularly to support mental health and notify staff of any changes. Long: maintain mental health and well being through teaching of rehab or prescribed medications independently. Short: Continue to focus on positive and work with staff Long; Continue to cope  with his pulmonary diseaes.      Interventions Encouraged to attend Pulmonary Rehabilitation for the exercise Encouraged to attend Pulmonary Rehabilitation for the  exercise      Continue Psychosocial Services  Follow up required by staff Follow up required by staff             Psychosocial Discharge (Final Psychosocial Re-Evaluation):  Psychosocial Re-Evaluation - 11/02/20 1426      Psychosocial Re-Evaluation   Current issues with Current Stress Concerns    Comments Hal is doing well in rehab.  He is usually pretty positive.  He is working with [employer] staff to get oxygen for use at their gym.  He did his research and they are going to look into it more for him.  He is sleeping well.    Expected Outcomes Short: Continue to focus on positive and work with staff Long; Continue to cope with his pulmonary diseaes.    Interventions Encouraged to attend Pulmonary Rehabilitation for the exercise    Continue Psychosocial Services  Follow up required by staff           Education: Education Goals: Education classes will be provided on a weekly basis, covering required topics. Participant will state understanding/return demonstration of topics presented.  Learning Barriers/Preferences:  Learning Barriers/Preferences - 09/12/20 1124      Learning Barriers/Preferences   Learning Barriers None    Learning Preferences None           General Pulmonary Education Topics:  Infection Prevention: - Provides verbal and written material to individual with discussion of infection control including proper hand washing and proper equipment cleaning during exercise session. Flowsheet Row Pulmonary Rehab from 11/09/2020 in [facility] Cardiac and Pulmonary Rehab  Date 09/15/20  Educator [facility]  Instruction Review Code 1- Verbalizes Understanding      Falls Prevention: - Provides verbal and written material to individual with discussion of falls prevention and safety. Flowsheet Row Pulmonary Rehab from 11/09/2020 in [facility] Cardiac and Pulmonary Rehab  Date 09/12/20  Educator SB  Instruction Review Code 1- Verbalizes Understanding      Chronic Lung Disease  Review: - Group verbal instruction with posters, models, PowerPoint presentations and videos,  to review new updates, new respiratory medications, new advancements in procedures and treatments. Providing information on websites and "800" numbers for continued self-education. Includes information about supplement oxygen, available portable oxygen systems, continuous and intermittent flow rates, oxygen safety, concentrators, and Medicare reimbursement for oxygen. Explanation of Pulmonary Drugs, including class, frequency, complications, importance of spacers, rinsing mouth after steroid MDI's, and proper cleaning methods for nebulizers. Review of basic lung anatomy and physiology related to function, structure, and complications of lung disease. Review of risk factors. Discussion about methods for diagnosing sleep apnea and types of masks and machines for OSA. Includes a review of the use of types of environmental controls: home humidity, furnaces, filters, dust mite/pet prevention, HEPA vacuums. Discussion about weather changes, air quality and the benefits of nasal washing. Instruction on Warning signs, infection symptoms, calling MD promptly, preventive modes, and value of vaccinations. Review of effective airway clearance, coughing and/or vibration techniques. Emphasizing that all should Create an Action Plan. Written material given at graduation. Flowsheet Row Pulmonary Rehab from 11/09/2020 in [facility] Cardiac and Pulmonary Rehab  Education need identified 09/15/20      AED/CPR: - Group verbal and written instruction with the use of models to demonstrate the basic use of the AED with the basic ABC's of resuscitation.  Anatomy and Cardiac Procedures: - Group verbal and visual presentation and models provide information about basic cardiac anatomy and function. Reviews the testing methods done to diagnose heart disease and the outcomes of the test results. Describes the treatment choices: Medical  Management, Angioplasty, or Coronary Bypass Surgery for treating various heart conditions including Myocardial Infarction, Angina, Valve Disease, and Cardiac Arrhythmias.  Written material given at graduation. Flowsheet Row Pulmonary Rehab from 11/09/2020 in [facility] Cardiac and Pulmonary Rehab  Date 09/21/20  Educator [facility]  Instruction Review Code 1- Verbalizes Understanding      Medication Safety: - Group verbal and visual instruction to review commonly prescribed medications for heart and lung disease. Reviews the medication, class of the drug, and side effects. Includes the steps to properly store meds and maintain the prescription regimen.  Written material given at graduation. Flowsheet Row Pulmonary Rehab from 11/09/2020 in [facility] Cardiac and Pulmonary Rehab  Date 10/12/20  Educator [facility]  Instruction Review Code 1- Verbalizes Understanding      Other: -Provides group and verbal instruction on various topics (see comments)   Knowledge Questionnaire Score:  Knowledge Questionnaire Score - 09/15/20 1344      Knowledge Questionnaire Score   Pre Score 16/18 Education Focus: O2 safety            Core Components/Risk Factors/Patient Goals at Admission:  Personal Goals and Risk Factors at Admission - 09/15/20 1345      Core Components/Risk Factors/Patient Goals on Admission    Weight Management Yes;Weight Gain    Intervention Weight Management: Develop a combined nutrition and exercise program designed to reach desired caloric intake, while maintaining appropriate intake of nutrient and fiber, sodium and fats, and appropriate energy expenditure required for the weight goal.;Weight Management: Provide education and appropriate resources to help participant work on and attain dietary goals.    Admit Weight 150 lb 3.2 oz (68.1 kg)    Goal Weight: Short Term 152 lb (68.9 kg)    Goal Weight: Long Term 170 lb (77.1 kg)    Expected Outcomes Short Term: Continue to assess and modify interventions  until short term weight is achieved;Long Term: Adherence to nutrition and physical activity/exercise program aimed toward attainment of established weight goal;Weight Gain: Understanding of general recommendations for a high calorie, high protein meal plan that promotes weight gain by distributing calorie intake throughout the day with the consumption for 4-5 meals, snacks, and/or supplements    Tobacco Cessation Yes    Number of packs per day o since 07/13/2020    Intervention Assist the participant in steps to quit. Provide individualized education and counseling about committing to Tobacco Cessation, relapse prevention, and pharmacological support that can be provided by physician.;Advice worker, assist with locating and accessing local/national Quit Smoking programs, and support quit date choice.    Expected Outcomes Short Term: Will demonstrate readiness to quit, by selecting a quit date.;Short Term: Will quit all tobacco product use, adhering to prevention of relapse plan.;Long Term: Complete abstinence from all tobacco products for at least 12 months from quit date.    Improve shortness of breath with ADL's Yes    Intervention Provide education, individualized exercise plan and daily activity instruction to help decrease symptoms of SOB with activities of daily living.    Expected Outcomes Short Term: Improve cardiorespiratory fitness to achieve a reduction of symptoms when performing ADLs;Long Term: Be able to perform more ADLs without symptoms or delay the onset of symptoms    Hypertension Yes  Intervention Provide education on lifestyle modifcations including regular physical activity/exercise, weight management, moderate sodium restriction and increased consumption of fresh fruit, vegetables, and low fat dairy, alcohol moderation, and smoking cessation.;Monitor prescription use compliance.    Expected Outcomes Short Term: Continued assessment and intervention until BP is <  140/50m HG in hypertensive participants. < 130/829mHG in hypertensive participants with diabetes, heart failure or chronic kidney disease.;Long Term: Maintenance of blood pressure at goal levels.    Lipids Yes    Intervention Provide education and support for participant on nutrition & aerobic/resistive exercise along with prescribed medications to achieve LDL <7080mHDL >19m41m  Expected Outcomes Short Term: Participant states understanding of desired cholesterol values and is compliant with medications prescribed. Participant is following exercise prescription and nutrition guidelines.;Long Term: Cholesterol controlled with medications as prescribed, with individualized exercise RX and with personalized nutrition plan. Value goals: LDL < 70mg54mL > 40 mg.           Education:Diabetes - Individual verbal and written instruction to review signs/symptoms of diabetes, desired ranges of glucose level fasting, after meals and with exercise. Acknowledge that pre and post exercise glucose checks will be done for 3 sessions at entry of program.   Know Your Numbers and Heart Failure: - Group verbal and visual instruction to discuss disease risk factors for cardiac and pulmonary disease and treatment options.  Reviews associated critical values for Overweight/Obesity, Hypertension, Cholesterol, and Diabetes.  Discusses basics of heart failure: signs/symptoms and treatments.  Introduces Heart Failure Zone chart for action plan for heart failure.  Written material given at graduation. Flowsheet [facility]  Date 10/19/20  Educator SB  Instruction Review Code 1- Verbalizes Understanding      Core Components/Risk Factors/Patient Goals Review:   Goals and Risk Factor Review    Row Name 10/05/20 1435 11/02/20 1422           Core Components/Risk Factors/Patient Goals Review   Personal Goals Review Improve shortness of breath with ADL's  Improve shortness of breath with ADL's;Weight Management/Obesity;Tobacco Cessation;Hypertension      Review Spoke to patient about their shortness of breath and what they can do to improve. Patient has been informed of breathing techniques when starting the program. Patient is informed to tell staff if they have had any med changes and that certain meds they are taking or not taking can be causing shortness of breath. Hal is doing well in rehab.  His weight was up last week but back down again this week, which is concerning for him.  He is using his PLB and breathing is getting a little bit better. Hal is staying away from smoking 4 months now!!  His pressures have been good in class.      Expected Outcomes Short: Attend [facility] regularly to improve shortness of breath with ADL's. Long: maintain independence with ADL's Short: Continue to monitor weight closely Long; Contiue to monitor risk factors             Core Components/Risk Factors/Patient Goals at Discharge (Final Review):   Goals and Risk Factor Review - 11/02/20 1422      Core Components/Risk Factors/Patient Goals Review   Personal Goals Review Improve shortness of breath with ADL's;Weight Management/Obesity;Tobacco Cessation;Hypertension    Review Hal is doing well in rehab.  His weight was up last week but back down again this week, which is concerning for him.  He is using his PLB  and breathing is getting a little bit better. Hal is staying away from smoking 4 months now!!  His pressures have been good in class.    Expected Outcomes Short: Continue to monitor weight closely Long; Contiue to monitor risk factors           ITP Comments:  ITP Comments    Row Name 09/12/20 1152 09/15/20 1336 09/19/20 1410 09/21/20 0658 10/19/20 1001   ITP Comments Virtual orientation call completed today. he has an appointment on Date: 09/15/2020 for EP eval and gym Orientation.  Documentation of diagnosis [facility] be found in [facility]  Date: 05/31/2020.  Completed 6MWT and gym orientation. Initial ITP created and sent for review to Dr. Emily Filbert, Medical Director. First full day of exercise!  Patient was oriented to gym and equipment including functions, settings, policies, and procedures.  Patient's individual exercise prescription and treatment plan were reviewed.  All starting workloads were established based on the results of the 6 minute walk test done at initial orientation visit.  The plan for exercise progression was also introduced and progression will be customized based on patient's performance and goals. 30 Day review completed. Medical Director ITP review done, changes made as directed, and signed approval by Medical Director.  New to program 30 Day review completed. Medical Director ITP review done, changes made as directed, and signed approval by Medical Director.   [facility] Name 11/16/20 0615           ITP Comments 30 Day review completed. Medical Director ITP review done, changes made as directed, and signed approval by Medical Director.              Comments:

## 2020-11-17 ENCOUNTER — Encounter: Payer: Medicare Other | Admitting: *Deleted

## 2020-11-17 ENCOUNTER — Other Ambulatory Visit: Payer: Self-pay

## 2020-11-17 ENCOUNTER — Other Ambulatory Visit: Payer: Self-pay | Admitting: Cardiology

## 2020-11-17 DIAGNOSIS — J431 Panlobular emphysema: Secondary | ICD-10-CM

## 2020-11-17 NOTE — Progress Notes (Signed)
Daily Session Note  Patient Details  Name: [name] [name]. MRN: [id number] Date of Birth: 13-May-1941 Referring Provider:   Flowsheet Row Pulmonary Rehab from 09/15/2020 in [facility]  Referring Provider Baltazar Apo MD      Encounter Date: 11/17/2020  Check In:  Session Check In - 11/17/20 1408      Check-In   Supervising physician immediately available to respond to emergencies See telemetry face sheet for immediately available ER MD    Location [facility] & Pul[facility]    Staff Present Renita Papa, RN BSN;Joseph Hood RCP,RRT,BSRT;Melissa [place] RDN, LDN    Virtual Visit No    Medication changes reported     No    Fall or balance concerns reported    No    Warm-up and Cool-down Performed on first and last piece of equipment    Resistance Training Performed Yes    VAD Patient? No    PAD/SET Patient? No      Pain Assessment   Currently in Pain? No/denies              Social History   Tobacco Use  Smoking Status Former Smoker  . Packs/day: 0.75  . Years: 67.00  . Pack years: 50.25  . Types: Cigarettes  . Start date: 60  . Quit date: 07/13/2020  . Years since quitting: 0.3  Smokeless Tobacco Never Used  Tobacco Comment   Recent Quit      Goals Met:  Independence with exercise equipment Exercise tolerated well No report of cardiac concerns or symptoms Strength training completed today  Goals Unmet:  Not Applicable  Comments: Pt able to follow exercise prescription today without complaint.  Will continue to monitor for progression.    Dr. Emily Filbert is Medical Director for [place] and [facility].

## 2020-11-18 ENCOUNTER — Other Ambulatory Visit: Payer: Self-pay

## 2020-11-18 MED ORDER — CLOPIDOGREL BISULFATE 75 MG PO TABS
75.0000 mg | ORAL_TABLET | Freq: Every day | ORAL | 1 refills | Status: DC
Start: 2020-11-18 — End: 2021-03-31

## 2020-11-21 ENCOUNTER — Other Ambulatory Visit: Payer: Self-pay

## 2020-11-21 DIAGNOSIS — J431 Panlobular emphysema: Secondary | ICD-10-CM

## 2020-11-21 NOTE — Progress Notes (Signed)
Daily Session Note  Patient Details  Name: [name] [name]. MRN: [id number] Date of Birth: 21-Jan-1941 Referring Provider:   Flowsheet Row Pulmonary Rehab from 09/15/2020 in [facility] and Pulmonary Rehab  Referring Provider Baltazar Apo MD      Encounter Date: 11/21/2020  Check In:  Session Check In - 11/21/20 1404      Check-In   Supervising physician immediately available to respond to emergencies See telemetry face sheet for immediately available ER MD    Location [facility]    Staff Present Birdie Sons, MPA, Mauricia Area, BS, ACSM CEP, Exercise Physiologist;Kara Eliezer Bottom, MS Exercise Physiologist    Virtual Visit No    Medication changes reported     No    Fall or balance concerns reported    No    Warm-up and Cool-down Performed on first and last piece of equipment    Resistance Training Performed Yes    VAD Patient? No    PAD/SET Patient? No      Pain Assessment   Currently in Pain? No/denies              Social History   Tobacco Use  Smoking Status Former Smoker  . Packs/day: 0.75  . Years: 67.00  . Pack years: 50.25  . Types: Cigarettes  . Start date: 33  . Quit date: 07/13/2020  . Years since quitting: 0.3  Smokeless Tobacco Never Used  Tobacco Comment   Recent Quit      Goals Met:  Independence with exercise equipment Exercise tolerated well No report of cardiac concerns or symptoms Strength training completed today  Goals Unmet:  Not Applicable  Comments: Pt able to follow exercise prescription today without complaint.  Will continue to monitor for progression.    Dr. Emily Filbert is Medical Director for [place] and [facility].

## 2020-11-23 ENCOUNTER — Other Ambulatory Visit: Payer: Self-pay

## 2020-11-23 DIAGNOSIS — J431 Panlobular emphysema: Secondary | ICD-10-CM | POA: Diagnosis not present

## 2020-11-23 NOTE — Progress Notes (Signed)
Daily Session Note  Patient Details  Name: Antwoin Lackey. MRN: [id number] Date of Birth: September 17, 1940 Referring Provider:   Flowsheet Row Pulmonary Rehab from 09/15/2020 in [facility] Card[facility] and Pulmonary Rehab  Referring Provider Baltazar Apo MD      Encounter Date: 11/23/2020  Check In:  Session Check In - 11/23/20 1416      Check-In   Supervising physician immediately available to respond to emergencies See telemetry face sheet for immediately available ER MD    Location [facility]    Staff Present Birdie Sons, MPA, RN;Joseph Lou Miner, MS Exercise Physiologist    Virtual Visit No    Medication changes reported     No    Fall or balance concerns reported    No    Warm-up and Cool-down Performed on first and last piece of equipment    Resistance Training Performed Yes    VAD Patient? No    PAD/SET Patient? No      Pain Assessment   Currently in Pain? No/denies              Social History   Tobacco Use  Smoking Status Former Smoker  . Packs/day: 0.75  . Years: 67.00  . Pack years: 50.25  . Types: Cigarettes  . Start date: 14  . Quit date: 07/13/2020  . Years since quitting: 0.3  Smokeless Tobacco Never Used  Tobacco Comment   Recent Quit      Goals Met:  Independence with exercise equipment Exercise tolerated well No report of cardiac concerns or symptoms Strength training completed today  Goals Unmet:  Not Applicable  Comments: Pt able to follow exercise prescription today without complaint.  Will continue to monitor for progression.    Dr. Emily Filbert is Medical Director for [place] and [facility].

## 2020-11-24 ENCOUNTER — Encounter: Payer: Medicare Other | Admitting: *Deleted

## 2020-11-24 ENCOUNTER — Other Ambulatory Visit: Payer: Self-pay

## 2020-11-24 DIAGNOSIS — J431 Panlobular emphysema: Secondary | ICD-10-CM | POA: Diagnosis not present

## 2020-11-24 NOTE — Progress Notes (Signed)
Daily Session Note  Patient Details  Name: [name] [name]. MRN: [id number] Date of Birth: 04/11/1941 Referring Provider:   Flowsheet Row Pulmonary Rehab from 09/15/2020 in [facility] Pulmonary Rehab  Referring Provider Baltazar Apo MD      Encounter Date: 11/24/2020  Check In:  Session Check In - 11/24/20 1406      Check-In   Supervising physician immediately available to respond to emergencies See telemetry face sheet for immediately available ER MD    Location [facility]    Staff Present Renita Papa, RN BSN;Joseph [address] [place], [place], [place], CCRP, CCET    Virtual Visit No    Medication changes reported     No    Fall or balance concerns reported    No    Warm-up and Cool-down Performed on first and last piece of equipment    Resistance Training Performed Yes    VAD Patient? No    PAD/SET Patient? No      Pain Assessment   Currently in Pain? No/denies              Social History   Tobacco Use  Smoking Status Former Smoker  . Packs/day: 0.75  . Years: 67.00  . Pack years: 50.25  . Types: Cigarettes  . Start date: 23  . Quit date: 07/13/2020  . Years since quitting: 0.3  Smokeless Tobacco Never Used  Tobacco Comment   Recent Quit      Goals Met:  Independence with exercise equipment Exercise tolerated well No report of cardiac concerns or symptoms Strength training completed today  Goals Unmet:  Not Applicable  Comments: Pt able to follow exercise prescription today without complaint.  Will continue to monitor for progression.    Dr. Emily Filbert is Medical Director for [place] and [facility].

## 2020-11-29 DIAGNOSIS — H5213 Myopia, bilateral: Secondary | ICD-10-CM | POA: Diagnosis not present

## 2020-11-29 DIAGNOSIS — H0102B Squamous blepharitis left eye, upper and lower eyelids: Secondary | ICD-10-CM | POA: Diagnosis not present

## 2020-11-29 DIAGNOSIS — H52223 Regular astigmatism, bilateral: Secondary | ICD-10-CM | POA: Diagnosis not present

## 2020-11-29 DIAGNOSIS — Z961 Presence of intraocular lens: Secondary | ICD-10-CM | POA: Diagnosis not present

## 2020-11-29 DIAGNOSIS — H0102A Squamous blepharitis right eye, upper and lower eyelids: Secondary | ICD-10-CM | POA: Diagnosis not present

## 2020-11-29 DIAGNOSIS — D3132 Benign neoplasm of left choroid: Secondary | ICD-10-CM | POA: Diagnosis not present

## 2020-11-29 DIAGNOSIS — H33301 Unspecified retinal break, right eye: Secondary | ICD-10-CM | POA: Diagnosis not present

## 2020-11-29 DIAGNOSIS — H02834 Dermatochalasis of left upper eyelid: Secondary | ICD-10-CM | POA: Diagnosis not present

## 2020-11-29 DIAGNOSIS — H524 Presbyopia: Secondary | ICD-10-CM | POA: Diagnosis not present

## 2020-11-29 DIAGNOSIS — H02831 Dermatochalasis of right upper eyelid: Secondary | ICD-10-CM | POA: Diagnosis not present

## 2020-11-30 ENCOUNTER — Other Ambulatory Visit: Payer: Self-pay

## 2020-11-30 ENCOUNTER — Encounter: Payer: Medicare Other | Attending: Emergency Medicine

## 2020-11-30 DIAGNOSIS — J431 Panlobular emphysema: Secondary | ICD-10-CM | POA: Insufficient documentation

## 2020-11-30 NOTE — Progress Notes (Signed)
Daily Session Note  Patient Details  Name: [name] [name]. MRN: [id number] Date of Birth: 03-31-1941 Referring Provider:   Flowsheet Row Pulmonary Rehab from 09/15/2020 in [facility] Pulmonary Rehab  Referring Provider Baltazar Apo MD      Encounter Date: 11/30/2020  Check In:  Session Check In - 11/30/20 1427      Check-In   Supervising physician immediately available to respond to emergencies See telemetry face sheet for immediately available ER MD    Location [facility]    Staff Present Birdie Sons, MPA, RN;Joseph Lou Miner, MS Exercise Physiologist    Virtual Visit No    Medication changes reported     No    Fall or balance concerns reported    No    Warm-up and Cool-down Performed on first and last piece of equipment    Resistance Training Performed Yes    VAD Patient? No    PAD/SET Patient? No      Pain Assessment   Currently in Pain? No/denies              Social History   Tobacco Use  Smoking Status Former Smoker  . Packs/day: 0.75  . Years: 67.00  . Pack years: 50.25  . Types: Cigarettes  . Start date: 69  . Quit date: 07/13/2020  . Years since quitting: 0.3  Smokeless Tobacco Never Used  Tobacco Comment   Recent Quit      Goals Met:  Independence with exercise equipment Exercise tolerated well No report of cardiac concerns or symptoms Strength training completed today  Goals Unmet:  Not Applicable  Comments: Pt able to follow exercise prescription today without complaint.  Will continue to monitor for progression.    Dr. Emily Filbert is Medical Director for [place] and [facility].

## 2020-12-01 ENCOUNTER — Encounter: Payer: Medicare Other | Admitting: *Deleted

## 2020-12-01 ENCOUNTER — Other Ambulatory Visit: Payer: Self-pay

## 2020-12-01 DIAGNOSIS — J431 Panlobular emphysema: Secondary | ICD-10-CM

## 2020-12-01 NOTE — Progress Notes (Signed)
Daily Session Note  Patient Details  Name: [name] [name]. MRN: [id number] Date of Birth: June 14, 1941 Referring Provider:   Flowsheet Row Pulmonary Rehab from 09/15/2020 in [facility] Cardiac and Pulmonary Rehab  Referring Provider Baltazar Apo MD      Encounter Date: 12/01/2020  Check In:  Session Check In - 12/01/20 1404      Check-In   Supervising physician immediately available to respond to emergencies See telemetry face sheet for immediately available ER MD    Location [facility]    Staff Present Renita Papa, RN BSN;Joseph [address] [place], [place], [place], CCRP, CCET    Virtual Visit No    Medication changes reported     No    Fall or balance concerns reported    No    Warm-up and Cool-down Performed on first and last piece of equipment    Resistance Training Performed Yes    VAD Patient? No    PAD/SET Patient? No      Pain Assessment   Currently in Pain? No/denies              Social History   Tobacco Use  Smoking Status Former Smoker  . Packs/day: 0.75  . Years: 67.00  . Pack years: 50.25  . Types: Cigarettes  . Start date: 54  . Quit date: 07/13/2020  . Years since quitting: 0.3  Smokeless Tobacco Never Used  Tobacco Comment   Recent Quit      Goals Met:  Independence with exercise equipment Exercise tolerated well No report of cardiac concerns or symptoms Strength training completed today  Goals Unmet:  Not Applicable  Comments: Pt able to follow exercise prescription today without complaint.  Will continue to monitor for progression.    Dr. Emily Filbert is Medical Director for [place] and [facility].

## 2020-12-05 ENCOUNTER — Other Ambulatory Visit: Payer: Self-pay

## 2020-12-05 DIAGNOSIS — J431 Panlobular emphysema: Secondary | ICD-10-CM

## 2020-12-05 MED ORDER — COMBIVENT RESPIMAT 20-100 MCG/ACT IN AERS: 1.0000 | INHALATION_SPRAY | Freq: Four times a day (QID) | RESPIRATORY_TRACT | 6 refills | Status: DC | PRN

## 2020-12-05 NOTE — Progress Notes (Signed)
Daily Session Note  Patient Details  Name: [name] [name]. MRN: [id number] Date of Birth: Mar 28, 1941 Referring Provider:   Flowsheet Row Pulmonary Rehab from 09/15/2020 in [facility] Cardiac and Pulmonary Rehab  Referring Provider Baltazar Apo MD      Encounter Date: 12/05/2020  Check In:  Session Check In - 12/05/20 1404      Check-In   Supervising physician immediately available to respond to emergencies See telemetry face sheet for immediately available ER MD    Location [facility]    Staff Present Birdie Sons, MPA, Mauricia Area, BS, ACSM CEP, Exercise Physiologist;Kara Eliezer Bottom, MS Exercise Physiologist    Virtual Visit No    Medication changes reported     No    Fall or balance concerns reported    No    Warm-up and Cool-down Performed on first and last piece of equipment    Resistance Training Performed Yes    VAD Patient? No    PAD/SET Patient? No      Pain Assessment   Currently in Pain? No/denies              Social History   Tobacco Use  Smoking Status Former Smoker  . Packs/day: 0.75  . Years: 67.00  . Pack years: 50.25  . Types: Cigarettes  . Start date: 89  . Quit date: 07/13/2020  . Years since quitting: 0.3  Smokeless Tobacco Never Used  Tobacco Comment   Recent Quit      Goals Met:  Independence with exercise equipment Exercise tolerated well No report of cardiac concerns or symptoms Strength training completed today  Goals Unmet:  Not Applicable  Comments: Pt able to follow exercise prescription today without complaint.  Will continue to monitor for progression.    Dr. Emily Filbert is Medical Director for [place] and [facility].

## 2020-12-07 ENCOUNTER — Other Ambulatory Visit: Payer: Self-pay

## 2020-12-07 DIAGNOSIS — I10 Essential (primary) hypertension: Secondary | ICD-10-CM | POA: Insufficient documentation

## 2020-12-07 DIAGNOSIS — Z0000 Encounter for general adult medical examination without abnormal findings: Secondary | ICD-10-CM | POA: Insufficient documentation

## 2020-12-07 DIAGNOSIS — M1990 Unspecified osteoarthritis, unspecified site: Secondary | ICD-10-CM | POA: Insufficient documentation

## 2020-12-07 DIAGNOSIS — M25562 Pain in left knee: Secondary | ICD-10-CM

## 2020-12-07 DIAGNOSIS — Z860100 Personal history of colon polyps, unspecified: Secondary | ICD-10-CM

## 2020-12-07 DIAGNOSIS — E7800 Pure hypercholesterolemia, unspecified: Secondary | ICD-10-CM

## 2020-12-07 DIAGNOSIS — I2510 Atherosclerotic heart disease of native coronary artery without angina pectoris: Secondary | ICD-10-CM | POA: Insufficient documentation

## 2020-12-07 DIAGNOSIS — J189 Pneumonia, unspecified organism: Secondary | ICD-10-CM | POA: Insufficient documentation

## 2020-12-07 DIAGNOSIS — R0600 Dyspnea, unspecified: Secondary | ICD-10-CM | POA: Insufficient documentation

## 2020-12-07 DIAGNOSIS — M179 Osteoarthritis of knee, unspecified: Secondary | ICD-10-CM | POA: Insufficient documentation

## 2020-12-07 DIAGNOSIS — J329 Chronic sinusitis, unspecified: Secondary | ICD-10-CM

## 2020-12-07 DIAGNOSIS — Q619 Cystic kidney disease, unspecified: Secondary | ICD-10-CM

## 2020-12-07 DIAGNOSIS — R109 Unspecified abdominal pain: Secondary | ICD-10-CM | POA: Insufficient documentation

## 2020-12-07 DIAGNOSIS — K629 Disease of anus and rectum, unspecified: Secondary | ICD-10-CM | POA: Insufficient documentation

## 2020-12-07 DIAGNOSIS — M25569 Pain in unspecified knee: Secondary | ICD-10-CM

## 2020-12-07 DIAGNOSIS — R002 Palpitations: Secondary | ICD-10-CM | POA: Insufficient documentation

## 2020-12-07 DIAGNOSIS — M720 Palmar fascial fibromatosis [Dupuytren]: Secondary | ICD-10-CM | POA: Insufficient documentation

## 2020-12-07 DIAGNOSIS — R0902 Hypoxemia: Secondary | ICD-10-CM | POA: Insufficient documentation

## 2020-12-07 DIAGNOSIS — H539 Unspecified visual disturbance: Secondary | ICD-10-CM

## 2020-12-07 DIAGNOSIS — S83209A Unspecified tear of unspecified meniscus, current injury, unspecified knee, initial encounter: Secondary | ICD-10-CM

## 2020-12-07 DIAGNOSIS — N401 Enlarged prostate with lower urinary tract symptoms: Secondary | ICD-10-CM

## 2020-12-07 DIAGNOSIS — M1710 Unilateral primary osteoarthritis, unspecified knee: Secondary | ICD-10-CM | POA: Insufficient documentation

## 2020-12-07 DIAGNOSIS — Z7901 Long term (current) use of anticoagulants: Secondary | ICD-10-CM | POA: Insufficient documentation

## 2020-12-07 DIAGNOSIS — F1010 Alcohol abuse, uncomplicated: Secondary | ICD-10-CM | POA: Insufficient documentation

## 2020-12-07 DIAGNOSIS — K219 Gastro-esophageal reflux disease without esophagitis: Secondary | ICD-10-CM | POA: Insufficient documentation

## 2020-12-07 DIAGNOSIS — H532 Diplopia: Secondary | ICD-10-CM

## 2020-12-07 DIAGNOSIS — J690 Pneumonitis due to inhalation of food and vomit: Secondary | ICD-10-CM | POA: Insufficient documentation

## 2020-12-07 DIAGNOSIS — C61 Malignant neoplasm of prostate: Secondary | ICD-10-CM | POA: Insufficient documentation

## 2020-12-07 DIAGNOSIS — J439 Emphysema, unspecified: Secondary | ICD-10-CM

## 2020-12-07 DIAGNOSIS — F1020 Alcohol dependence, uncomplicated: Secondary | ICD-10-CM

## 2020-12-07 DIAGNOSIS — J441 Chronic obstructive pulmonary disease with (acute) exacerbation: Secondary | ICD-10-CM | POA: Insufficient documentation

## 2020-12-07 DIAGNOSIS — N281 Cyst of kidney, acquired: Secondary | ICD-10-CM | POA: Insufficient documentation

## 2020-12-07 DIAGNOSIS — E785 Hyperlipidemia, unspecified: Secondary | ICD-10-CM | POA: Insufficient documentation

## 2020-12-07 DIAGNOSIS — R972 Elevated prostate specific antigen [PSA]: Secondary | ICD-10-CM | POA: Insufficient documentation

## 2020-12-07 DIAGNOSIS — I739 Peripheral vascular disease, unspecified: Secondary | ICD-10-CM | POA: Insufficient documentation

## 2020-12-07 DIAGNOSIS — M75100 Unspecified rotator cuff tear or rupture of unspecified shoulder, not specified as traumatic: Secondary | ICD-10-CM

## 2020-12-07 DIAGNOSIS — M779 Enthesopathy, unspecified: Secondary | ICD-10-CM

## 2020-12-07 DIAGNOSIS — Z1389 Encounter for screening for other disorder: Secondary | ICD-10-CM

## 2020-12-07 DIAGNOSIS — N400 Enlarged prostate without lower urinary tract symptoms: Secondary | ICD-10-CM | POA: Insufficient documentation

## 2020-12-07 DIAGNOSIS — Z79899 Other long term (current) drug therapy: Secondary | ICD-10-CM

## 2020-12-07 DIAGNOSIS — N529 Male erectile dysfunction, unspecified: Secondary | ICD-10-CM | POA: Insufficient documentation

## 2020-12-07 DIAGNOSIS — L84 Corns and callosities: Secondary | ICD-10-CM | POA: Insufficient documentation

## 2020-12-07 DIAGNOSIS — C801 Malignant (primary) neoplasm, unspecified: Secondary | ICD-10-CM | POA: Insufficient documentation

## 2020-12-07 DIAGNOSIS — Z720 Tobacco use: Secondary | ICD-10-CM

## 2020-12-07 HISTORY — DX: Disease of anus and rectum, unspecified: K62.9

## 2020-12-07 HISTORY — DX: Pure hypercholesterolemia, unspecified: E78.00

## 2020-12-07 HISTORY — DX: Enthesopathy, unspecified: M77.9

## 2020-12-07 HISTORY — DX: Cyst of kidney, acquired: N28.1

## 2020-12-07 HISTORY — DX: Osteoarthritis of knee, unspecified: M17.9

## 2020-12-07 HISTORY — DX: Cystic kidney disease, unspecified: Q61.9

## 2020-12-07 HISTORY — DX: Other long term (current) drug therapy: Z79.899

## 2020-12-07 HISTORY — DX: Chronic obstructive pulmonary disease with (acute) exacerbation: J44.1

## 2020-12-07 HISTORY — DX: Palmar fascial fibromatosis (dupuytren): M72.0

## 2020-12-07 HISTORY — DX: Hypoxemia: R09.02

## 2020-12-07 HISTORY — DX: Male erectile dysfunction, unspecified: N52.9

## 2020-12-07 HISTORY — DX: Chronic sinusitis, unspecified: J32.9

## 2020-12-07 HISTORY — DX: Peripheral vascular disease, unspecified: I73.9

## 2020-12-07 HISTORY — DX: Unspecified tear of unspecified meniscus, current injury, unspecified knee, initial encounter: S83.209A

## 2020-12-07 HISTORY — DX: Benign prostatic hyperplasia with lower urinary tract symptoms: N40.1

## 2020-12-07 HISTORY — DX: Personal history of colon polyps, unspecified: Z86.0100

## 2020-12-07 HISTORY — DX: Unilateral primary osteoarthritis, unspecified knee: M17.10

## 2020-12-07 HISTORY — DX: Pain in unspecified knee: M25.569

## 2020-12-07 HISTORY — DX: Corns and callosities: L84

## 2020-12-07 HISTORY — DX: Tobacco use: Z72.0

## 2020-12-07 HISTORY — DX: Alcohol dependence, uncomplicated: F10.20

## 2020-12-07 HISTORY — DX: Encounter for screening for other disorder: Z13.89

## 2020-12-07 HISTORY — DX: Unspecified visual disturbance: H53.9

## 2020-12-07 HISTORY — DX: Diplopia: H53.2

## 2020-12-07 HISTORY — DX: Pain in left knee: M25.562

## 2020-12-07 HISTORY — DX: Emphysema, unspecified: J43.9

## 2020-12-07 HISTORY — DX: Unspecified rotator cuff tear or rupture of unspecified shoulder, not specified as traumatic: M75.100

## 2020-12-07 HISTORY — DX: Unspecified abdominal pain: R10.9

## 2020-12-07 HISTORY — DX: Personal history of colonic polyps: Z86.010

## 2020-12-07 HISTORY — DX: Encounter for general adult medical examination without abnormal findings: Z00.00

## 2020-12-12 ENCOUNTER — Telehealth: Payer: Self-pay

## 2020-12-12 DIAGNOSIS — J431 Panlobular emphysema: Secondary | ICD-10-CM

## 2020-12-12 NOTE — Telephone Encounter (Signed)
[name] [name] is at session 31 and would like to discharge from the program as he cant come this week or next.  Staff will mail education info

## 2020-12-12 NOTE — Progress Notes (Signed)
Discharge Progress Report  Patient Details  Name: [name] [name]. MRN: [id number] Date of Birth: [birth year] Referring Provider:   Flowsheet Row Pulmonary Rehab from 09/15/2020 in [facility] Cardiac and Pulmonary Rehab  Referring Provider Baltazar Apo MD       Number of Visits: 31  Reason for Discharge:  Patient reached a stable level of exercise.  Smoking History:  Social History   Tobacco Use  Smoking Status Former Smoker  . Packs/day: 0.75  . Years: 67.00  . Pack years: 50.25  . Types: Cigarettes  . Start date: 90  . Quit date: 07/13/2020  . Years since quitting: 0.4  Smokeless Tobacco Never Used  Tobacco Comment   Recent Quit      Diagnosis:  Panlobular emphysema ([place])  ADL UCSD:  Pulmonary Assessment Scores    Row Name 09/15/20 1347         ADL UCSD   ADL Phase Entry     SOB Score total 56     Rest 0     Walk 2     Stairs 4     Bath 3     Dress 2     Shop 3           CAT Score   CAT Score 16           mMRC Score   mMRC Score 3            Initial Exercise Prescription:  Initial Exercise Prescription - 09/15/20 1300      Date of Initial Exercise RX and Referring Provider   Date 09/15/20    Referring Provider Baltazar Apo MD      Oxygen   Oxygen Continuous    Liters 4      Treadmill   MPH 1.3    Grade 0.5    Minutes 15    METs 2.09      Recumbant Bike   Level 1    RPM 50    Watts 6    Minutes 15    METs 2      Arm Ergometer   Level 1    Watts 18    RPM 25    Minutes 15    METs 2      REL-XR   Level 1    Speed 50    Minutes 15    METs 2      Prescription Details   Frequency (times per week) 3    Duration Progress to 30 minutes of continuous aerobic without signs/symptoms of physical distress      Intensity   THRR 40-80% of Max Heartrate 107-130    Ratings of Perceived Exertion 11-13    Perceived Dyspnea 0-4      Progression   Progression Continue to progress workloads to maintain intensity without  signs/symptoms of physical distress.      Resistance Training   Training Prescription Yes    Weight 3 lb    Reps 10-15           Discharge Exercise Prescription (Final Exercise Prescription Changes):  Exercise Prescription Changes - 11/30/20 0900      Response to Exercise   Blood Pressure (Admit) 110/60    Blood Pressure (Exercise) 132/74    Blood Pressure (Exit) 102/62    Heart Rate (Admit) 90 bpm    Heart Rate (Exercise) 98 bpm    Heart Rate (Exit) 90 bpm    Oxygen Saturation (Admit) 93 %  Oxygen Saturation (Exercise) 89 %    Oxygen Saturation (Exit) 97 %    Perceived Dyspnea (Exercise) 2    Symptoms SOB    Duration Continue with 30 min of aerobic exercise without signs/symptoms of physical distress.    Intensity THRR unchanged      Progression   Progression Continue to progress workloads to maintain intensity without signs/symptoms of physical distress.    Average METs 1.8      Resistance Training   Training Prescription Yes    Weight 4lb    Reps 10-15      Interval Training   Interval Training No      Oxygen   Oxygen Continuous    Liters 4      NuStep   Level 4    Minutes 30    METs 2      Home Exercise Plan   Plans to continue exercise at [employer] (comment)   [place]   Frequency Add 1 additional day to program exercise sessions.   Add on 2nd day after   Initial Home Exercises Provided 11/09/20           Functional Capacity:  6 Minute Walk    Row Name 09/15/20 1336         6 Minute Walk   Phase Initial     Distance 580 feet     Walk Time 4.18 minutes     # of Rest Breaks 2  16 sec, 1:33     MPH 1.58     METS 1.74     RPE 15     Perceived Dyspnea  3     VO2 Peak 6.04     Symptoms Yes (comment)     Comments SOB, leg fatigue     Resting HR 85 bpm     Resting BP 126/64     Resting Oxygen Saturation  99 %     Exercise Oxygen Saturation  during 6 min walk 92 %     Max Ex. HR 98 bpm     Max Ex. BP 156/74     2 Minute Post  BP 136/74           Interval HR   1 Minute HR 85     2 Minute HR 82     3 Minute HR 90     4 Minute HR 94     5 Minute HR 98     6 Minute HR 83     2 Minute Post HR 94     Interval Heart Rate? Yes           Interval Oxygen   Interval Oxygen? Yes     Baseline Oxygen Saturation % 99 %     1 Minute Oxygen Saturation % 95 %     1 Minute Liters of Oxygen 4 L  pulsed     2 Minute Oxygen Saturation % 94 %     2 Minute Liters of Oxygen 4 L     3 Minute Oxygen Saturation % 91 %     3 Minute Liters of Oxygen 5 L     4 Minute Oxygen Saturation % 92 %  seated     4 Minute Liters of Oxygen 5 L     5 Minute Oxygen Saturation % 93 %     5 Minute Liters of Oxygen 5 L     6 Minute Oxygen Saturation % 92 %  6 Minute Liters of Oxygen 5 L     2 Minute Post Oxygen Saturation % 96 %     2 Minute Post Liters of Oxygen 4 L            Psychological, QOL, Others - Outcomes: PHQ 2/9: Depression screen PHQ 2/9 09/15/2020  Decreased Interest 0  Down, Depressed, Hopeless 0  PHQ - 2 Score 0  Altered sleeping 0  Tired, decreased energy 0  Change in appetite 0  Feeling bad or failure about yourself  0  Trouble concentrating 0  Moving slowly or fidgety/restless 0  Suicidal thoughts 0  PHQ-9 Score 0  Difficult doing work/chores Not difficult at all    Quality of Life:   Nutrition & Weight - Outcomes:  Pre Biometrics - 09/15/20 1343      Pre Biometrics   Height 5' 9.75" (1.772 m)    Weight 150 lb 3.2 oz (68.1 kg)    BMI (Calculated) 21.7    Single Leg Stand 2.9 seconds            Nutrition:  Nutrition Therapy & Goals - 09/21/20 1543      Nutrition Therapy   Diet Pulmonary MNT    Protein (specify units) 82g    Fiber 30 grams    Whole Grain Foods 3 servings    Saturated Fats 12 max. grams    Fruits and Vegetables 8 servings/day    Sodium 1.5 grams      Personal Nutrition Goals   Nutrition Goal ST: add fat and protein to breakfast - butter, nuts/seeds, eggs LT: gain  weight - 170lbs    Comments Ensure some of the time. Handful of pistachios. B: english muffin or half of bagel or cold cereal with fruit, egg with bacon and toast 1x/week with coffee and orange juice S: chocolate and pistachios L: cup of soup, sandwich, chips, drink S: milkshake D: meat (variety - fish, meat, chicken), vegetable (green vegetables), starch, salad. S: ice cream  Hal report losing weight everytime he went to the doctor and has been eating more to [place] weight, he is now relatively weight stable at 150 lbs, but would like to be 170lbs. Discussed pulmonary nutrition including more protein. suggested snacks like peanut butter to add fat and protein, protein with breakfast like boiled eggs and some additional fat.      Intervention Plan   Intervention Prescribe, educate and counsel regarding individualized specific dietary modifications aiming towards targeted core components such as weight, hypertension, lipid management, diabetes, heart failure and other comorbidities.;Nutrition handout(s) given to patient.    Expected Outcomes Short Term Goal: Understand basic principles of dietary content, such as calories, fat, sodium, cholesterol and nutrients.;Short Term Goal: A plan has been developed with personal nutrition goals set during dietitian appointment.;Long Term Goal: Adherence to prescribed nutrition plan.           Nutrition Discharge:   Education Questionnaire Score:  Knowledge Questionnaire Score - 09/15/20 1344      Knowledge Questionnaire Score   Pre Score 16/18 Education Focus: O2 safety           Goals reviewed with patient; copy given to patient.

## 2020-12-12 NOTE — Progress Notes (Signed)
Pulmonary Individual Treatment Plan  Patient Details  Name: [name] [name]. 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  albuterol (PROVENTIL) (2.5 MG/3ML) 0.083% nebulizer solution, Take 3 mLs (2.5 mg total) by nebulization every 6 (six) hours as needed for wheezing or shortness of breath., Disp: 150 mL, Rfl: 5 .  albuterol (VENTOLIN HFA) 108 (90 Base) MCG/ACT inhaler, Inhale 2 puffs into the lungs 3 (three) times daily as needed for wheezing or shortness of breath., Disp: , Rfl:  .  Budeson-Glycopyrrol-Formoterol (BREZTRI AEROSPHERE) 160-9-4.8 MCG/ACT AERO, Inhale 2 puffs into the lungs 2 (two) times daily., Disp: 10.7 g, Rfl: 0 .  cholecalciferol (VITAMIN D3) 25 MCG (1000 UNIT) tablet, Take 1,000 Units by mouth 3 (three) times a week. , Disp: , Rfl:  .  clopidogrel (PLAVIX) 75 MG tablet, Take 1 tablet (75 mg total) by mouth daily. Ok to restart this medication on Wednesday 04/13/20., Disp: 90 tablet, Rfl: 1 .  esomeprazole (NEXIUM) 20 MG capsule, Take 20 mg by mouth daily. , Disp: , Rfl:  .  Ipratropium-Albuterol (COMBIVENT RESPIMAT) 20-100 MCG/ACT AERS respimat, Inhale 1-2 puffs into the lungs every 6 (six) hours as needed for wheezing or shortness of breath., Disp: 4 g, Rfl: 6 .  ketoconazole (NIZORAL) 2 % cream, Apply 1 application topically daily., Disp: , Rfl:  .  Melatonin 5 MG CAPS, Take 5 mg by mouth at bedtime., Disp: , Rfl:  .  nitroGLYCERIN (NITROSTAT) 0.4 MG SL tablet, Place 1 tablet (0.4 mg total) under the tongue every 5 (five) minutes as needed for chest pain., Disp: 25 tablet,  Rfl: 11 .  predniSONE (DELTASONE) 5 MG tablet, Take 5 mg by mouth daily., Disp: , Rfl:  .  rosuvastatin (CRESTOR) 20 MG tablet, Take 20 mg by mouth daily., Disp: , Rfl:  .  tamsulosin (FLOMAX) 0.4 MG CAPS capsule, Take 0.4 mg by mouth daily., Disp: , Rfl:  .  varenicline (CHANTIX) 1 MG tablet, Take 0.5-1 mg by mouth 2 (two) times daily., Disp: , Rfl:   Past Medical History: Past Medical History:  Diagnosis Date  . Abdominal discomfort 12/07/2020  . Acquired trigger finger of right middle finger 02/04/2020  . Acute prostatitis 04/08/2018  . Alcohol abuse   . Alcohol dependence ([place]) 12/07/2020  . Anal or rectal pain 04/08/2018  . Annual physical exam 12/07/2020  . Anorectal disorder 12/07/2020  . Arthritis   . Atherosclerotic heart disease of native coronary artery without angina pectoris 04/08/2018  . Benign prostatic hyperplasia with lower urinary tract symptoms 12/07/2020  . BPH with elevated PSA   . 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 Chronic obstructive pulmonary disease with (acute) exacerbation ([place]) 12/07/2020  . Chronic respiratory failure with hypoxia ([place]) 08/10/2019  . Chronic sinusitis 12/07/2020  . Cigarette nicotine dependence, uncomplicated 03/26/33  . Coagulation disorder ([place]) 01/13/2019  . 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 Contracture of palmar fascia 12/07/2020  . COPD (chronic obstructive pulmonary disease) ([place])   . Corn of toe 12/07/2020  . Corns and  callosities 04/09/2018  . Coronary artery disease    2019 with stents  . Coronary atherosclerosis 05/07/2018  . Diarrhea 04/09/2018  . Double vision with both eyes open 12/07/2020  . 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 Dysphagia 08/14/2018  . Dyspnea   . ED (erectile dysfunction)   . ED (erectile dysfunction) of organic origin 12/07/2020  . Elevated prostate specific antigen (PSA) 04/08/2018  . Elevated PSA   . Encounter for screening for other disorder  12/07/2020  . Enlarged prostate without lower urinary tract symptoms (luts) 04/09/2018  . Enthesopathy 12/07/2020  . Essential (primary) hypertension 04/08/2018  . Ex-smoker 04/09/2018  . Exertional dyspnea 04/02/2018  . 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 Flatulence 04/08/2018  . Gastro-esophageal reflux disease without esophagitis 04/08/2018  . GERD (gastroesophageal reflux disease)   . History of acute otitis externa 08/14/2018  . Hyperlipidemia   . Hypertension   . Hypoxemia 12/07/2020  . Kidney cysts   . Left knee pain 12/07/2020  . Low back pain 04/08/2018  . Male erectile dysfunction 04/09/2018  . Mixed hyperlipidemia 04/08/2018  . Nocturia 04/09/2018  . Osteoarthritis of first carpometacarpal joint 04/08/2018  . Osteoarthritis of knee 12/07/2020  . Other long term (current) drug therapy 12/07/2020  . Pain due to onychomycosis of toenail of left foot 07/20/2019  . Pain in joint 04/09/2018  . Pain in knee 04/09/2018  . Pain in unspecified knee 12/07/2020  . Pain of finger 04/09/2018  . Palpitations   . 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[place]) 12/07/2020  . Personal history of colonic polyps 12/07/2020  . Pneumonia   . Pneumothorax 04/12/2020  . Polypharmacy 04/09/2018  . Porokeratosis 01/13/2019  . 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[place]) 12/07/2020  . Pulmonary nodules 06/08/2016  . Pure hypercholesterolemia 12/07/2020  . Renal cyst 12/07/2020  . Sleep disorder 04/08/2018  . Status post bronchoscopy 04/12/2020  . Tear of rotator cuff 04/09/2018  . Tobacco user 12/07/2020  . Uncomplicated alcohol dependence ([place]) 04/09/2018  . Unspecified rotator cuff tear or rupture of unspecified shoulder, not specified as traumatic 12/07/2020  . Unspecified tear of unspecified meniscus, current injury, unspecified knee, initial encounter 12/07/2020  . Visual disturbance 12/07/2020  . Vitamin D deficiency 04/08/2018    Tobacco Use: Social History   Tobacco Use  Smoking Status Former Smoker  . Packs/day: 0.75  .  Years: 67.00  . Pack years: 50.25  . Types: Cigarettes  . Start date: 15  . Quit date: 07/13/2020  . Years since quitting: 0.4  Smokeless Tobacco Never Used  Tobacco Comment   Recent Quit      Labs: Recent Review Flowsheet Data    Labs for ITP Cardiac and Pulmonary Rehab Latest Ref Rng & Units 06/06/2018   Cholestrol 100 - 199 mg/dL 122   LDLCALC 0 - 99 mg/dL 50   HDL >39 mg/dL 61   Trlycerides 0 - 149 mg/dL 54       Pulmonary Assessment Scores:  Pulmonary Assessment Scores    Row Name 09/15/20 1347         ADL UCSD   ADL Phase Entry     SOB Score total 56     Rest 0     Walk 2     Stairs 4     Bath 3     Dress 2     Shop 3           CAT Score   CAT Score 16  mMRC Score   mMRC Score 3            UCSD: Self-administered rating of dyspnea associated with activities of daily living (ADLs) 6-point scale (0 = "not at all" to 5 = "maximal or unable to do because of breathlessness")  Scoring Scores range from 0 to 120.  Minimally important difference is 5 units  CAT: CAT can identify the health impairment of COPD patients and is better correlated with disease progression.  CAT has a scoring range of zero to 40. The CAT score is classified into four groups of low (less than 10), medium (10 - 20), high (21-30) and very high (31-40) based on the impact level of disease on health status. A CAT score over 10 suggests significant symptoms.  A worsening CAT score could be explained by an exacerbation, poor medication adherence, poor inhaler technique, or progression of COPD or comorbid conditions.  CAT MCID is 2 points  mMRC: mMRC (Modified Medical Research Council) Dyspnea Scale is used to assess the degree of baseline functional disability in patients of respiratory disease due to dyspnea. No minimal important difference is established. A decrease in score of 1 point or greater is considered a positive change.   Pulmonary Function Assessment:  Pulmonary  Function Assessment - 09/15/20 1347      Breath   Shortness of Breath Yes;Fear of Shortness of Breath;Limiting activity           Exercise Target Goals: Exercise Program Goal: Individual exercise prescription set using results from initial 6 min walk test and THRR while considering  patient's activity barriers and safety.   Exercise Prescription Goal: Initial exercise prescription builds to 30-45 minutes a day of aerobic activity, 2-3 days per week.  Home exercise guidelines will be given to patient during program as part of exercise prescription that the participant will acknowledge.  Education: Aerobic Exercise: - Group verbal and visual presentation on the components of exercise prescription. Introduces F.I.T.T principle from ACSM for exercise prescriptions.  Reviews F.I.T.T. principles of aerobic exercise including progression. Written material given at graduation. Flowsheet Row Pulmonary Rehab from 11/30/2020 in [facility] Cardiac and Pulmonary Rehab  Date 11/16/20  Educator AS  Instruction Review Code 1- Verbalizes Understanding      Education: Resistance Exercise: - Group verbal and visual presentation on the components of exercise prescription. Introduces F.I.T.T principle from ACSM for exercise prescriptions  Reviews F.I.T.T. principles of resistance exercise including progression. Written material given at graduation. Flowsheet Row Pulmonary Rehab from 11/30/2020 in [facility] Cardiac and Pulmonary Rehab  Date 09/21/20  Educator AS  Instruction Review Code 1- Verbalizes Understanding       Education: Exercise & Equipment Safety: - Individual verbal instruction and demonstration of equipment use and safety with use of the equipment. Flowsheet Row Pulmonary Rehab from 11/30/2020 in [facility] Cardiac and Pulmonary Rehab  Date 09/15/20  Educator [facility]  Instruction Review Code 1- Verbalizes Understanding      Education: Exercise Physiology & General Exercise Guidelines: - Group verbal and  written instruction with models to review the exercise physiology of the cardiovascular system and associated critical values. Provides general exercise guidelines with specific guidelines to those with heart or lung disease.  Flowsheet Row Pulmonary Rehab from 11/30/2020 in [facility] Cardiac and Pulmonary Rehab  Date 11/09/20  Educator AS  Instruction Review Code 1- Verbalizes Understanding      Education: Flexibility, Balance, Mind/Body Relaxation: - Group verbal and visual presentation with interactive activity on the components of exercise prescription.  Introduces F.I.T.T principle from ACSM for exercise prescriptions. Reviews F.I.T.T. principles of flexibility and balance exercise training including progression. Also discusses the mind body connection.  Reviews various relaxation techniques to help reduce and manage stress (i.e. Deep breathing, progressive muscle relaxation, and visualization). Balance handout provided to take home. Written material given at graduation. Flowsheet Row Pulmonary Rehab from 11/30/2020 in [facility] Cardiac and Pulmonary Rehab  Date 11/30/20  Educator AS  Instruction Review Code 1- Verbalizes Understanding      Activity Barriers & Risk Stratification:  Activity Barriers & Cardiac Risk Stratification - 09/15/20 1339      Activity Barriers & Cardiac Risk Stratification   Activity Barriers Shortness of Breath;Balance Concerns;Other (comment);Deconditioning;Muscular Weakness    Comments minimizes walking because of SOB.           6 Minute Walk:  6 Minute Walk    Row Name 09/15/20 1336         6 Minute Walk   Phase Initial     Distance 580 feet     Walk Time 4.18 minutes     # of Rest Breaks 2  16 sec, 1:33     MPH 1.58     METS 1.74     RPE 15     Perceived Dyspnea  3     VO2 Peak 6.04     Symptoms Yes (comment)     Comments SOB, leg fatigue     Resting HR 85 bpm     Resting BP 126/64     Resting Oxygen Saturation  99 %     Exercise Oxygen Saturation   during 6 min walk 92 %     Max Ex. HR 98 bpm     Max Ex. BP 156/74     2 Minute Post BP 136/74           Interval HR   1 Minute HR 85     2 Minute HR 82     3 Minute HR 90     4 Minute HR 94     5 Minute HR 98     6 Minute HR 83     2 Minute Post HR 94     Interval Heart Rate? Yes           Interval Oxygen   Interval Oxygen? Yes     Baseline Oxygen Saturation % 99 %     1 Minute Oxygen Saturation % 95 %     1 Minute Liters of Oxygen 4 L  pulsed     2 Minute Oxygen Saturation % 94 %     2 Minute Liters of Oxygen 4 L     3 Minute Oxygen Saturation % 91 %     3 Minute Liters of Oxygen 5 L     4 Minute Oxygen Saturation % 92 %  seated     4 Minute Liters of Oxygen 5 L     5 Minute Oxygen Saturation % 93 %     5 Minute Liters of Oxygen 5 L     6 Minute Oxygen Saturation % 92 %     6 Minute Liters of Oxygen 5 L     2 Minute Post Oxygen Saturation % 96 %     2 Minute Post Liters of Oxygen 4 L           Oxygen Initial Assessment:  Oxygen Initial Assessment - 09/15/20 1346      Home Oxygen  Home Oxygen Device Portable Concentrator    Sleep Oxygen Prescription Pulsed    Liters per minute 2    Home Exercise Oxygen Prescription Pulsed    Liters per minute 4    Home Resting Oxygen Prescription None    Compliance with Home Oxygen Use Yes      Initial 6 min Walk   Oxygen Used Pulsed;Portable Concentrator    Liters per minute 4      Program Oxygen Prescription   Program Oxygen Prescription Continuous;E-Tanks    Liters per minute 4      Intervention   Short Term Goals To learn and exhibit compliance with exercise, home and travel O2 prescription;To learn and understand importance of monitoring SPO2 with pulse oximeter and demonstrate accurate use of the pulse oximeter.;To learn and understand importance of maintaining oxygen saturations>88%;To learn and demonstrate proper pursed lip breathing techniques or other breathing techniques.;To learn and demonstrate proper use of  respiratory medications    Long  Term Goals Exhibits compliance with exercise, home and travel O2 prescription;Verbalizes importance of monitoring SPO2 with pulse oximeter and return demonstration;Maintenance of O2 saturations>88%;Exhibits proper breathing techniques, such as pursed lip breathing or other method taught during program session;Compliance with respiratory medication;Demonstrates proper use of MDI's           Oxygen Re-Evaluation:  Oxygen Re-Evaluation    Row Name 09/19/20 1411 10/05/20 1433 11/02/20 1420 12/01/20 1438       Program Oxygen Prescription   Program Oxygen Prescription Continuous;E-Tanks E-Tanks;Continuous E-Tanks;Continuous Continuous    Liters per minute 4 4 4 4          Home Oxygen   Home Oxygen Device Portable Concentrator Portable Concentrator Portable Concentrator;Home Concentrator Portable Concentrator;Home Concentrator    Sleep Oxygen Prescription Pulsed None None Continuous    Liters per minute 2 -- 2 2    Home Exercise Oxygen Prescription Pulsed Pulsed Pulsed Continuous    Liters per minute 4 3 3 3     Home Resting Oxygen Prescription None None None Continuous    Liters per minute -- -- -- 2    Compliance with Home Oxygen Use Yes Yes Yes Yes         Goals/Expected Outcomes   Short Term Goals To learn and exhibit compliance with exercise, home and travel O2 prescription;To learn and understand importance of monitoring SPO2 with pulse oximeter and demonstrate accurate use of the pulse oximeter.;To learn and understand importance of maintaining oxygen saturations>88%;To learn and demonstrate proper pursed lip breathing techniques or other breathing techniques. To learn and exhibit compliance with exercise, home and travel O2 prescription;To learn and understand importance of monitoring SPO2 with pulse oximeter and demonstrate accurate use of the pulse oximeter.;To learn and understand importance of maintaining oxygen saturations>88%;To learn and  demonstrate proper pursed lip breathing techniques or other breathing techniques.;To learn and demonstrate proper use of respiratory medications To learn and exhibit compliance with exercise, home and travel O2 prescription;To learn and understand importance of monitoring SPO2 with pulse oximeter and demonstrate accurate use of the pulse oximeter.;To learn and understand importance of maintaining oxygen saturations>88%;To learn and demonstrate proper pursed lip breathing techniques or other breathing techniques.;To learn and demonstrate proper use of respiratory medications To learn and demonstrate proper use of respiratory medications;To learn and understand importance of maintaining oxygen saturations>88%;To learn and understand importance of monitoring SPO2 with pulse oximeter and demonstrate accurate use of the pulse oximeter.    Long  Term Goals Exhibits compliance with exercise, home and  travel O2 prescription;Verbalizes importance of monitoring SPO2 with pulse oximeter and return demonstration;Maintenance of O2 saturations>88%;Exhibits proper breathing techniques, such as pursed lip breathing or other method taught during program session Exhibits compliance with exercise, home and travel O2 prescription;Verbalizes importance of monitoring SPO2 with pulse oximeter and return demonstration;Maintenance of O2 saturations>88%;Exhibits proper breathing techniques, such as pursed lip breathing or other method taught during program session;Compliance with respiratory medication;Demonstrates proper use of MDI's Exhibits compliance with exercise, home and travel O2 prescription;Verbalizes importance of monitoring SPO2 with pulse oximeter and return demonstration;Maintenance of O2 saturations>88%;Exhibits proper breathing techniques, such as pursed lip breathing or other method taught during program session;Compliance with respiratory medication;Demonstrates proper use of MDI's Compliance with respiratory  medication;Maintenance of O2 saturations>88%;Verbalizes importance of monitoring SPO2 with pulse oximeter and return demonstration    Comments Reviewed PLB technique with pt.  Talked about how it works and it's importance in maintaining their exercise saturations. Hal states that he has been having alot more shortness of breath the past week. Went over his respiratory medications and he states that he does not have an inhaler for any of his medications. Gave patient a spacer and informed him how to use it. Patient will get back to staff if he feels a difference in his breathing next week. Two of his medications can be used with his spacer. Hal is doing well with his breathing.  He feels that after he exercises his breathing improves temporarily. Overall, it seems to be getting better.  He is using the spacer we gave him to help.  He has also gotten better at using his PLB. Hal is checking his oxygen at home. He has no questions about his medications. His oxygen has been more stable and staying above 88 percent while doing exercise.    Goals/Expected Outcomes Short: Become more profiecient at using PLB.   Long: Become independent at using PLB. Short: use spacer with his medications. Long: maintain taking his medication independently with spacer. Short: Continue to use oxygen at home for activity Long; Continue to manage breathing Short: continue checking oxygen at home. Long: maintain breathing techniques and oxygen saturation independently.           Oxygen Discharge (Final Oxygen Re-Evaluation):  Oxygen Re-Evaluation - 12/01/20 1438      Program Oxygen Prescription   Program Oxygen Prescription Continuous    Liters per minute 4      Home Oxygen   Home Oxygen Device Portable Concentrator;Home Concentrator    Sleep Oxygen Prescription Continuous    Liters per minute 2    Home Exercise Oxygen Prescription Continuous    Liters per minute 3    Home Resting Oxygen Prescription Continuous    Liters  per minute 2    Compliance with Home Oxygen Use Yes      Goals/Expected Outcomes   Short Term Goals To learn and demonstrate proper use of respiratory medications;To learn and understand importance of maintaining oxygen saturations>88%;To learn and understand importance of monitoring SPO2 with pulse oximeter and demonstrate accurate use of the pulse oximeter.    Long  Term Goals Compliance with respiratory medication;Maintenance of O2 saturations>88%;Verbalizes importance of monitoring SPO2 with pulse oximeter and return demonstration    Comments Hal is checking his oxygen at home. He has no questions about his medications. His oxygen has been more stable and staying above 88 percent while doing exercise.    Goals/Expected Outcomes Short: continue checking oxygen at home. Long: maintain breathing techniques and oxygen saturation  independently.           Initial Exercise Prescription:  Initial Exercise Prescription - 09/15/20 1300      Date of Initial Exercise RX and Referring Provider   Date 09/15/20    Referring Provider Baltazar Apo MD      Oxygen   Oxygen Continuous    Liters 4      Treadmill   MPH 1.3    Grade 0.5    Minutes 15    METs 2.09      Recumbant Bike   Level 1    RPM 50    Watts 6    Minutes 15    METs 2      Arm Ergometer   Level 1    Watts 18    RPM 25    Minutes 15    METs 2      REL-XR   Level 1    Speed 50    Minutes 15    METs 2      Prescription Details   Frequency (times per week) 3    Duration Progress to 30 minutes of continuous aerobic without signs/symptoms of physical distress      Intensity   THRR 40-80% of Max Heartrate 107-130    Ratings of Perceived Exertion 11-13    Perceived Dyspnea 0-4      Progression   Progression Continue to progress workloads to maintain intensity without signs/symptoms of physical distress.      Resistance Training   Training Prescription Yes    Weight 3 lb    Reps 10-15           Perform  Capillary Blood Glucose checks as needed.  Exercise Prescription Changes:  Exercise Prescription Changes    Row Name 09/15/20 1300 10/03/20 1600 10/17/20 1000 11/09/20 1500 11/16/20 0900     Response to Exercise   Blood Pressure (Admit) 126/64 132/60 104/58 -- 122/64   Blood Pressure (Exercise) 156/74 138/68 132/60 -- 128/62   Blood Pressure (Exit) 122/68 134/70 98/60 -- 132/62   Heart Rate (Admit) 85 bpm 82 bpm 93 bpm -- 99 bpm   Heart Rate (Exercise) 98 bpm 86 bpm 96 bpm -- 103 bpm   Heart Rate (Exit) 87 bpm 83 bpm 93 bpm -- 103 bpm   Oxygen Saturation (Admit) 99 % 97 % 99 % -- 97 %   Oxygen Saturation (Exercise) 91 % 96 % 94 % -- 88 %   Oxygen Saturation (Exit) 99 % 100 % 98 % -- 99 %   Rating of Perceived Exertion (Exercise) 15 13 12  -- --   Perceived Dyspnea (Exercise) 3 2 2  -- 3   Symptoms SOB, leg fatigue SOB SOB -- --   Comments walk test results -- -- -- --   Duration -- Continue with 30 min of aerobic exercise without signs/symptoms of physical distress. Continue with 30 min of aerobic exercise without signs/symptoms of physical distress. -- Continue with 30 min of aerobic exercise without signs/symptoms of physical distress.   Intensity -- THRR unchanged THRR unchanged -- THRR unchanged     Progression   Progression -- Continue to progress workloads to maintain intensity without signs/symptoms of physical distress. Continue to progress workloads to maintain intensity without signs/symptoms of physical distress. -- Continue to progress workloads to maintain intensity without signs/symptoms of physical distress.   Average METs -- 1.99 2.27 -- 3.17     Resistance Training   Training Prescription -- Yes Yes --  Yes   Weight -- 3 lb 3 lb -- 4lb   Reps -- 10-15 10-15 -- 10-15     Interval Training   Interval Training -- No No -- No     Oxygen   Oxygen -- Continuous Continuous -- Continuous   Liters -- 4 4 -- 4     Recumbant Bike   Level -- 2 3 -- 5   Watts -- 16 -- -- --    Minutes -- 15 15 -- 15   METs -- 2.67 3.14 -- 3.17     NuStep   Level -- 1 1 -- --   Minutes -- 15 15 -- --   METs -- 1.5 1.4 -- --     Arm Ergometer   Level -- 1 -- -- --   Minutes -- 115 -- -- --   METs -- 1.8 -- -- --     T5 Nustep   Level -- -- -- -- 1   Minutes -- -- -- -- 15     Home Exercise Plan   Plans to continue exercise at -- -- -- [employer] (comment)  [employer] planner (comment)  [place]   Frequency -- -- -- Add 1 additional day to program exercise sessions.  Add on 2nd day after Add 1 additional day to program exercise sessions.  Add on 2nd day after   Initial Home Exercises Provided -- -- -- 11/09/20 11/09/20   Row Name 11/30/20 0900             Response to Exercise   Blood Pressure (Admit) 110/60       Blood Pressure (Exercise) 132/74       Blood Pressure (Exit) 102/62       Heart Rate (Admit) 90 bpm       Heart Rate (Exercise) 98 bpm       Heart Rate (Exit) 90 bpm       Oxygen Saturation (Admit) 93 %       Oxygen Saturation (Exercise) 89 %       Oxygen Saturation (Exit) 97 %       Perceived Dyspnea (Exercise) 2       Symptoms SOB       Duration Continue with 30 min of aerobic exercise without signs/symptoms of physical distress.       Intensity THRR unchanged               Progression   Progression Continue to progress workloads to maintain intensity without signs/symptoms of physical distress.       Average METs 1.8               Resistance Training   Training Prescription Yes       Weight 4lb       Reps 10-15               Interval Training   Interval Training No               Oxygen   Oxygen Continuous       Liters 4               NuStep   Level 4       Minutes 30       METs 2               Home Exercise Plan   Plans to continue exercise at [employer] (comment)  [facility]  Frequency Add 1 additional day to program exercise sessions.  Add on 2nd day after       Initial Home Exercises  Provided 11/09/20              Exercise Comments:  Exercise Comments    Row Name 09/19/20 1410           Exercise Comments First full day of exercise!  Patient was oriented to gym and equipment including functions, settings, policies, and procedures.  Patient's individual exercise prescription and treatment plan were reviewed.  All starting workloads were established based on the results of the 6 minute walk test done at initial orientation visit.  The plan for exercise progression was also introduced and progression will be customized based on patient's performance and goals.              Exercise Goals and Review:  Exercise Goals    Row Name 09/15/20 1342             Exercise Goals   Increase Physical Activity Yes       Intervention Provide advice, education, support and counseling about physical activity/exercise needs.;Develop an individualized exercise prescription for aerobic and resistive training based on initial evaluation findings, risk stratification, comorbidities and participant's personal goals.       Expected Outcomes Short Term: Attend rehab on a regular basis to increase amount of physical activity.;Long Term: Add in home exercise to make exercise part of routine and to increase amount of physical activity.;Long Term: Exercising regularly at least 3-5 days a week.       Increase Strength and Stamina Yes       Intervention Provide advice, education, support and counseling about physical activity/exercise needs.;Develop an individualized exercise prescription for aerobic and resistive training based on initial evaluation findings, risk stratification, comorbidities and participant's personal goals.       Expected Outcomes Short Term: Perform resistance training exercises routinely during rehab and add in resistance training at home;Short Term: Increase workloads from initial exercise prescription for resistance, speed, and METs.;Long Term: Improve cardiorespiratory  fitness, muscular endurance and strength as measured by increased METs and functional capacity (6MWT)       Able to understand and use rate of perceived exertion (RPE) scale Yes       Intervention Provide education and explanation on how to use RPE scale       Expected Outcomes Short Term: Able to use RPE daily in rehab to express subjective intensity level;Long Term:  Able to use RPE to guide intensity level when exercising independently       Able to understand and use Dyspnea scale Yes       Intervention Provide education and explanation on how to use Dyspnea scale       Expected Outcomes Short Term: Able to use Dyspnea scale daily in rehab to express subjective sense of shortness of breath during exertion;Long Term: Able to use Dyspnea scale to guide intensity level when exercising independently       Knowledge and understanding of Target Heart Rate Range (THRR) Yes       Intervention Provide education and explanation of THRR including how the numbers were predicted and where they are located for reference       Expected Outcomes Short Term: Able to state/look up THRR;Short Term: Able to use daily as guideline for intensity in rehab;Long Term: Able to use THRR to govern intensity when exercising independently       Able to  check pulse independently Yes       Intervention Provide education and demonstration on how to check pulse in carotid and radial arteries.;Review the importance of being able to check your own pulse for safety during independent exercise       Expected Outcomes Short Term: Able to explain why pulse checking is important during independent exercise;Long Term: Able to check pulse independently and accurately       Understanding of Exercise Prescription Yes       Intervention Provide education, explanation, and written materials on patient's individual exercise prescription       Expected Outcomes Short Term: Able to explain program exercise prescription;Long Term: Able to explain  home exercise prescription to exercise independently              Exercise Goals Re-Evaluation :  Exercise Goals Re-Evaluation    Row Name 09/19/20 1410 10/03/20 1611 10/05/20 1442 10/17/20 1043 11/02/20 1419     Exercise Goal Re-Evaluation   Exercise Goals Review Increase Physical Activity;Able to understand and use rate of perceived exertion (RPE) scale;Knowledge and understanding of Target Heart Rate Range (THRR);Understanding of Exercise Prescription;Increase Strength and Stamina;Able to understand and use Dyspnea scale;Able to check pulse independently Increase Physical Activity;Increase Strength and Stamina;Understanding of Exercise Prescription Understanding of Exercise Prescription;Increase Physical Activity;Increase Strength and Stamina Increase Physical Activity;Increase Strength and Stamina Increase Physical Activity;Increase Strength and Stamina;Understanding of Exercise Prescription   Comments Reviewed RPE and dyspnea scales, THR and program prescription with pt today.  Pt voiced understanding and was given a copy of goals to take home. Hal is off to a good start in rehab.  He is now up to 16 watts on the bike.  We will continue to monitor his progress. Hal is going to go the they gym at [facility] where he and his wife live after the program is over. He is doing well maintaining his shortness of breath in the program. He rest when he needs but has to work on PLB when he is exercising more. Hal is tolerating exercise well.  Hs oxygen has stayed in mid to upper 90s on 4L continuous. Hal is doing well in rehab.  He has met with the director at [employer] to help get them to supply continuous flow oxygen in the gym for those that can only get pulsed for out and about. He is feeling better overall.   Expected Outcomes Short: Use RPE daily to regulate intensity. Long: Follow program prescription in THR. Short: Continue to attend regularly Long: Continue to follow program prescription Short:  work on PLB while exercising. Long: PLB when exercising independently. Short: try to increae levels on machines Long: improve overall stamina Short: Continue to make plans for exercising at home Long: Continue to improve stamina.   [place] Name 11/09/20 1518 11/16/20 0906 11/30/20 0904         Exercise Goal Re-Evaluation   Exercise Goals Review -- Increase Physical Activity;Increase Strength and Stamina Increase Physical Activity;Increase Strength and Stamina;Understanding of Exercise Prescription     Comments Reviewed home exercise with pt today.  Pt plans to attend his gym facility at [facility] for exercise.  Reviewed THR, pulse, RPE, sign and symptoms, pulse oximetery and when to call 911 or MD.  Also discussed weather considerations and indoor options.  Pt voiced understanding. Hal tolerates exercise well and with only one exception has maintained oxygen in the 90s.  He has increased to 4lb for strength work.  We will  continue to monitor progress. Hal continues to do well in rehab.  He does frequently need to be reminded to replace his mask, but he is doing well otherwise.  He is up to level 4 on the NuStep.  He does not want to use the treadmill or walk the track. We will continue to monitor his progress.     Expected Outcomes -- Short: continue to attend consistently Long:  conitnue to build stamina Short: Continue to try to push more Long: Continue to build stamina.            Discharge Exercise Prescription (Final Exercise Prescription Changes):  Exercise Prescription Changes - 11/30/20 0900      Response to Exercise   Blood Pressure (Admit) 110/60    Blood Pressure (Exercise) 132/74    Blood Pressure (Exit) 102/62    Heart Rate (Admit) 90 bpm    Heart Rate (Exercise) 98 bpm    Heart Rate (Exit) 90 bpm    Oxygen Saturation (Admit) 93 %    Oxygen Saturation (Exercise) 89 %    Oxygen Saturation (Exit) 97 %    Perceived Dyspnea (Exercise) 2    Symptoms SOB    Duration Continue with 30  min of aerobic exercise without signs/symptoms of physical distress.    Intensity THRR unchanged      Progression   Progression Continue to progress workloads to maintain intensity without signs/symptoms of physical distress.    Average METs 1.8      Resistance Training   Training Prescription Yes    Weight 4lb    Reps 10-15      Interval Training   Interval Training No      Oxygen   Oxygen Continuous    Liters 4      NuStep   Level 4    Minutes 30    METs 2      Home Exercise Plan   Plans to continue exercise at [employer] (comment)   [place]   Frequency Add 1 additional day to program exercise sessions.   Add on 2nd day after   Initial Home Exercises Provided 11/09/20           Nutrition:  Target Goals: Understanding of nutrition guidelines, daily intake of sodium <1563m, cholesterol <2056m calories 30% from fat and 7% or less from saturated fats, daily to have 5 or more servings of fruits and vegetables.  Education: All About Nutrition: -Group instruction provided by verbal, written material, interactive activities, discussions, models, and posters to present general guidelines for heart healthy nutrition including fat, fiber, MyPlate, the role of sodium in heart healthy nutrition, utilization of the nutrition label, and utilization of this knowledge for meal planning. Follow up email sent as well. Written material given at graduation. [facility]  Date 10/05/20  Educator [facility] CorporationInstruction Review Code 1- Verbalizes Understanding      Biometrics:  Pre Biometrics - 09/15/20 1343      Pre Biometrics   Height 5' 9.75" (1.772 m)    Weight 150 lb 3.2 oz (68.1 kg)    BMI (Calculated) 21.7    Single Leg Stand 2.9 seconds            Nutrition Therapy Plan and Nutrition Goals:  Nutrition Therapy & Goals - 09/21/20 1543      Nutrition Therapy   Diet Pulmonary MNT    Protein (specify  units) 82g  Fiber 30 grams    Whole Grain Foods 3 servings    Saturated Fats 12 max. grams    Fruits and Vegetables 8 servings/day    Sodium 1.5 grams      Personal Nutrition Goals   Nutrition Goal ST: add fat and protein to breakfast - butter, nuts/seeds, eggs LT: gain weight - 170lbs    Comments Ensure some of the time. Handful of pistachios. B: english muffin or half of bagel or cold cereal with fruit, egg with bacon and toast 1x/week with coffee and orange juice S: chocolate and pistachios L: cup of soup, sandwich, chips, drink S: milkshake D: meat (variety - fish, meat, chicken), vegetable (green vegetables), starch, salad. S: ice cream  Hal report losing weight everytime he went to the doctor and has been eating more to gaiun weight, he is now relatively weight stable at 150 lbs, but would like to be 170lbs. Discussed pulmonary nutrition including more protein. suggested snacks like peanut butter to add fat and protein, protein with breakfast like boiled eggs and some additional fat.      Intervention Plan   Intervention Prescribe, educate and counsel regarding individualized specific dietary modifications aiming towards targeted core components such as weight, hypertension, lipid management, diabetes, heart failure and other comorbidities.;Nutrition handout(s) given to patient.    Expected Outcomes Short Term Goal: Understand basic principles of dietary content, such as calories, fat, sodium, cholesterol and nutrients.;Short Term Goal: A plan has been developed with personal nutrition goals set during dietitian appointment.;Long Term Goal: Adherence to prescribed nutrition plan.           Nutrition Assessments:  MEDIFICTS Score Key:  ?70 Need to make dietary changes   40-70 Heart Healthy Diet  ? 40 Therapeutic Level Cholesterol Diet  Flowsheet Row Pulmonary Rehab from 09/15/2020 in [facility] Cardiac and Pulmonary Rehab  Picture Your Plate Total Score on Admission 54     Picture  Your Plate Scores:  <74 Unhealthy dietary pattern with much room for improvement.  41-50 Dietary pattern unlikely to meet recommendations for good health and room for improvement.  51-60 More healthful dietary pattern, with some room for improvement.   >60 Healthy dietary pattern, although there may be some specific behaviors that could be improved.   Nutrition Goals Re-Evaluation:  Nutrition Goals Re-Evaluation    [place] Name 11/02/20 1425 12/01/20 1446           Goals   Nutrition Goal ST: add fat and protein to breakfast - butter, nuts/seeds, eggs LT: gain weight - 170lbs Weight gain.      Comment Hal is eating well and trying to get in more protein.  He has taken to drinking shakes and eating protein snacks to hlep.  He is trying to eat more eggs for breakfast to help add in more protein.  He feels that he is eating well. Hal wants to gain more weight but has not been able to. He is snacking and eating 3 meals a day.      Expected Outcome Short: Continue to add in protein Long: Continue to work on weight gain. Short: gain more weight. Long: maintain weight gain.             Nutrition Goals Discharge (Final Nutrition Goals Re-Evaluation):  Nutrition Goals Re-Evaluation - 12/01/20 1446      Goals   Nutrition Goal Weight gain.    Comment Hal wants to gain more weight but has not been able to. He is snacking and  eating 3 meals a day.    Expected Outcome Short: gain more weight. Long: maintain weight gain.           Psychosocial: Target Goals: Acknowledge presence or absence of significant depression and/or stress, maximize coping skills, provide positive support system. Participant is able to verbalize types and ability to use techniques and skills needed for reducing stress and depression.   Education: Stress, Anxiety, and Depression - Group verbal and visual presentation to define topics covered.  Reviews how body is impacted by stress, anxiety, and depression.  Also discusses  healthy ways to reduce stress and to treat/manage anxiety and depression.  Written material given at graduation. Flowsheet Row Pulmonary Rehab from 11/30/2020 in [facility] and Pulmonary Re[facility]  Date 11/02/20  Educator [facility]  Instruction Review Code 1- [employer] Understanding      Education: Sleep Hygiene -Provides group verbal and written instruction about how sleep can affect your health.  Define sleep hygiene, discuss sleep cycles and impact of sleep habits. Review good sleep hygiene tips.    Initial Review & Psychosocial Screening:  Initial Psych Review & Screening - 09/12/20 1136      Barriers   Psychosocial barriers to participate in program There are no identifiable barriers or psychosocial needs.      Screening Interventions   Interventions Encouraged to exercise;To provide support and resources with identified psychosocial needs;Provide feedback about the scores to participant           Quality of Life Scores:  Scores of 19 and below usually indicate a poorer quality of life in these areas.  A difference of  2-3 points is a clinically meaningful difference.  A difference of 2-3 points in the total score of the Quality of Life Index has been associated with significant improvement in overall quality of life, self-image, physical symptoms, and general health in studies assessing change in quality of life.  PHQ-9: Recent Review Flowsheet Data    Depression screen [facility] 2/9 09/15/2020   Decreased Interest 0   Down, Depressed, Hopeless 0   PHQ - 2 Score 0   Altered sleeping 0   Tired, decreased energy 0   Change in appetite 0   Feeling bad or failure about yourself  0   Trouble concentrating 0   Moving slowly or fidgety/restless 0   Suicidal thoughts 0   PHQ-9 Score 0   Difficult doing work/chores Not difficult at all     Interpretation of Total Score  Total Score Depression Severity:  1-4 = Minimal depression, 5-9 = Mild depression, 10-14 = Moderate depression, 15-19 =  Moderately severe depression, 20-27 = Severe depression   Psychosocial Evaluation and Intervention:  Psychosocial Evaluation - 09/12/20 1137      Psychosocial Evaluation & Interventions   Interventions Encouraged to exercise with the program and follow exercise prescription    Comments Camauri has no barriers to entering the program. He lives in a [place] at [place] with his wife and their dog. THey had moved to [facility] last year from [facility]. He keeps busy with social activities- a golf group on Sundays which is golf and then a meal. He has not been able to play golf for the past 4-5 months as the course has restrictions on how close a cart can get to the green and his shortness of breath limits his ability to play with the retsitctions.  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 He stays active planning golf outings out of town at least 2 times a year.  He does not usually walk the dog , as his wife does that. He does take to dog to their dog pasrk at [facility] and to the dog park in [place]. He does have a portable oxygen concentrator that he will use if needed during the day. Walking to the car and then off when driving is an example. He quit tobacco in Dec 2021 and feels confident he will not resume tobacco use. He lives in a smoke free environment. His shortness of breath is limintg to him. He is hoping to see improvement to enable him to participate more in his social activities. He is concerned that he is losing too much weight. He is around 150 pounds from 175 pounds last year. He did find an article that speaks to the calori needs of a patient with COPD and is interested in learning more so he can gain back some weight and stop weight loss.    Expected Outcomes STG: Kaden attends all scheduled appointments, he remains tobacco free. He meets with RD about his weight concerns.  LTG: Able to maintian tobacco cessation, has learned about maintianing his  weight, ways to improve his SOB and is able to use the tools for improve quality of life           Psychosocial Re-Evaluation:  Psychosocial Re-Evaluation    [place] Name 10/05/20 1440 11/02/20 1426 12/01/20 1447         Psychosocial Re-Evaluation   Current issues with Current Stress Concerns Current Stress Concerns Current Stress Concerns     Comments Patient states that he is generally a positve person but sometimes gets down becuase he cannot do the things he use to do such as golfing. His shortness of breath often gets in the way of him being able to do things. Hal is doing well in rehab.  He is usually pretty positive.  He is working with [employer] staff to get oxygen for use at their gym.  He did his research and they are going to look into it more for him.  He is sleeping well. Hal states that he is not looking forward to death but is to an extent. He knows that you cant be prepared for everything and is taking it one day at a time. He understand that he has a progressive disease and is aware what will happen.     Expected Outcomes Short: Continue to exercise regularly to support mental health and notify staff of any changes. Long: maintain mental health and well being through teaching of rehab or prescribed medications independently. Short: Continue to focus on positive and work with staff Long; Continue to cope with his pulmonary diseaes. Short: Continue to exercise regularly to support mental health and notify staff of any changes. Long: maintain mental health and well being through teaching of rehab or prescribed medications independently.     Interventions Encouraged to attend Pulmonary Rehabilitation for the exercise Encouraged to attend Pulmonary Rehabilitation for the exercise Encouraged to attend Pulmonary Rehabilitation for the exercise     Continue Psychosocial Services  Follow up required by staff Follow up required by staff Follow up required by staff            Psychosocial  Discharge (Final Psychosocial Re-Evaluation):  Psychosocial Re-Evaluation - 12/01/20 1447      Psychosocial Re-Evaluation   Current issues with Current Stress Concerns  Comments Hal states that he is not looking forward to death but is to an extent. He knows that you cant be prepared for everything and is taking it one day at a time. He understand that he has a progressive disease and is aware what will happen.    Expected Outcomes Short: Continue to exercise regularly to support mental health and notify staff of any changes. Long: maintain mental health and well being through teaching of rehab or prescribed medications independently.    Interventions Encouraged to attend Pulmonary Rehabilitation for the exercise    Continue Psychosocial Services  Follow up required by staff           Education: Education Goals: Education classes will be provided on a weekly basis, covering required topics. Participant will state understanding/return demonstration of topics presented.  Learning Barriers/Preferences:  Learning Barriers/Preferences - 09/12/20 1124      Learning Barriers/Preferences   Learning Barriers None    Learning Preferences None           General Pulmonary Education Topics:  Infection Prevention: - Provides verbal and written material to individual with discussion of infection control including proper hand washing and proper equipment cleaning during exercise session. Flowsheet Row Pulmonary Rehab from 11/30/2020 in [facility] Cardiac and Pulmonary Rehab  Date 09/15/20  Educator [facility]  Instruction Review Code 1- Verbalizes Understanding      Falls Prevention: - Provides verbal and written material to individual with discussion of falls prevention and safety. Flowsheet Row Pulmonary Rehab from 11/30/2020 in [facility] Cardiac and Pulmonary Rehab  Date 09/12/20  Educator SB  Instruction Review Code 1- Verbalizes Understanding      Chronic Lung Disease Review: - Group verbal  instruction with posters, models, PowerPoint presentations and videos,  to review new updates, new respiratory medications, new advancements in procedures and treatments. Providing information on websites and "800" numbers for continued self-education. Includes information about supplement oxygen, available portable oxygen systems, continuous and intermittent flow rates, oxygen safety, concentrators, and Medicare reimbursement for oxygen. Explanation of Pulmonary Drugs, including class, frequency, complications, importance of spacers, rinsing mouth after steroid MDI's, and proper cleaning methods for nebulizers. Review of basic lung anatomy and physiology related to function, structure, and complications of lung disease. Review of risk factors. Discussion about methods for diagnosing sleep apnea and types of masks and machines for OSA. Includes a review of the use of types of environmental controls: home humidity, furnaces, filters, dust mite/pet prevention, HEPA vacuums. Discussion about weather changes, air quality and the benefits of nasal washing. Instruction on Warning signs, infection symptoms, calling MD promptly, preventive modes, and value of vaccinations. Review of effective airway clearance, coughing and/or vibration techniques. Emphasizing that all should Create an Action Plan. Written material given at graduation. Flowsheet Row Pulmonary Rehab from 11/30/2020 in [facility] and Pulmonary Rehab  Education need identified 09/15/20      AED/CPR: - Group verbal and written instruction with the use of models to demonstrate the basic use of the AED with the basic ABC's of resuscitation.    Anatomy and Cardiac Procedures: - Group verbal and visual presentation and models provide information about basic cardiac anatomy and function. Reviews the testing methods done to diagnose heart disease and the outcomes of the test results. Describes the treatment choices: Medical Management, Angioplasty, or  Coronary Bypass Surgery for treating various heart conditions including Myocardial Infarction, Angina, Valve Disease, and Cardiac Arrhythmias.  Written material given at graduation. Flowsheet Row Pulmonary Rehab from 11/30/2020 in [facility]  Cardiac and Pulmonary Rehab  Date 09/21/20  Educator [facility]  Instruction Review Code 1- Verbalizes Understanding      Medication Safety: - Group verbal and visual instruction to review commonly prescribed medications for heart and lung disease. Reviews the medication, class of the drug, and side effects. Includes the steps to properly store meds and maintain the prescription regimen.  Written material given at graduation. Flowsheet Row Pulmonary Rehab from 11/30/2020 in [facility] Cardiac and Pulmonary Rehab  Date 10/12/20  Educator [facility]  Instruction Review Code 1- Verbalizes Understanding      Other: -Provides group and verbal instruction on various topics (see comments)   Knowledge Questionnaire Score:  Knowledge Questionnaire Score - 09/15/20 1344      Knowledge Questionnaire Score   Pre Score 16/18 Education Focus: O2 safety            Core Components/Risk Factors/Patient Goals at Admission:  Personal Goals and Risk Factors at Admission - 09/15/20 1345      Core Components/Risk Factors/Patient Goals on Admission    Weight Management Yes;Weight Gain    Intervention Weight Management: Develop a combined nutrition and exercise program designed to reach desired caloric intake, while maintaining appropriate intake of nutrient and fiber, sodium and fats, and appropriate energy expenditure required for the weight goal.;Weight Management: Provide education and appropriate resources to help participant work on and attain dietary goals.    Admit Weight 150 lb 3.2 oz (68.1 kg)    Goal Weight: Short Term 152 lb (68.9 kg)    Goal Weight: Long Term 170 lb (77.1 kg)    Expected Outcomes Short Term: Continue to assess and modify interventions until short term weight is  achieved;Long Term: Adherence to nutrition and physical activity/exercise program aimed toward attainment of established weight goal;Weight Gain: Understanding of general recommendations for a high calorie, high protein meal plan that promotes weight gain by distributing calorie intake throughout the day with the consumption for 4-5 meals, snacks, and/or supplements    Tobacco Cessation Yes    Number of packs per day o since 07/13/2020    Intervention Assist the participant in steps to quit. Provide individualized education and counseling about committing to Tobacco Cessation, relapse prevention, and pharmacological support that can be provided by physician.;Advice worker, assist with locating and accessing local/national Quit Smoking programs, and support quit date choice.    Expected Outcomes Short Term: Will demonstrate readiness to quit, by selecting a quit date.;Short Term: Will quit all tobacco product use, adhering to prevention of relapse plan.;Long Term: Complete abstinence from all tobacco products for at least 12 months from quit date.    Improve shortness of breath with ADL's Yes    Intervention Provide education, individualized exercise plan and daily activity instruction to help decrease symptoms of SOB with activities of daily living.    Expected Outcomes Short Term: Improve cardiorespiratory fitness to achieve a reduction of symptoms when performing ADLs;Long Term: Be able to perform more ADLs without symptoms or delay the onset of symptoms    Hypertension Yes    Intervention Provide education on lifestyle modifcations including regular physical activity/exercise, weight management, moderate sodium restriction and increased consumption of fresh fruit, vegetables, and low fat dairy, alcohol moderation, and smoking cessation.;Monitor prescription use compliance.    Expected Outcomes Short Term: Continued assessment and intervention until BP is < 140/67m HG in hypertensive  participants. < 130/85mHG in hypertensive participants with diabetes, heart failure or chronic kidney disease.;Long Term: Maintenance of blood pressure at  goal levels.    Lipids Yes    Intervention Provide education and support for participant on nutrition & aerobic/resistive exercise along with prescribed medications to achieve LDL <42m, HDL >466m    Expected Outcomes Short Term: Participant states understanding of desired cholesterol values and is compliant with medications prescribed. Participant is following exercise prescription and nutrition guidelines.;Long Term: Cholesterol controlled with medications as prescribed, with individualized exercise RX and with personalized nutrition plan. Value goals: LDL < 7059mHDL > 40 mg.           Education:Diabetes - Individual verbal and written instruction to review signs/symptoms of diabetes, desired ranges of glucose level fasting, after meals and with exercise. Acknowledge that pre and post exercise glucose checks will be done for 3 sessions at entry of program.   Know Your Numbers and Heart Failure: - Group verbal and visual instruction to discuss disease risk factors for cardiac and pulmonary disease and treatment options.  Reviews associated critical values for Overweight/Obesity, Hypertension, Cholesterol, and Diabetes.  Discusses basics of heart failure: signs/symptoms and treatments.  Introduces Heart Failure Zone chart for action plan for heart failure.  Written material given at graduation. Flowsheet Row [facility]  Date 10/19/20  Educator [facility]  Instruction Review Code 1- Verbalizes Understanding      Core Components/Risk Factors/Patient Goals Review:   Goals and Risk Factor Review    Row Name 10/05/20 1435 11/02/20 1422 12/01/20 1442         Core Components/Risk Factors/Patient Goals Review   Personal Goals Review Improve shortness of breath with ADL's Improve shortness of  breath with ADL's;Weight Management/Obesity;Tobacco Cessation;Hypertension Weight Management/Obesity;Improve shortness of breath with ADL's     Review Spoke to patient about their shortness of breath and what they can do to improve. Patient has been informed of breathing techniques when starting the program. Patient is informed to tell staff if they have had any med changes and that certain meds they are taking or not taking can be causing shortness of breath. Hal is doing well in rehab.  His weight was up last week but back down again this week, which is concerning for him.  He is using his PLB and breathing is getting a little bit better. Hal is staying away from smoking 4 months now!!  His pressures have been good in class. Hal would like to gain more weight but has been staying the same since he started the program. He states that taking a shower is hard for him and gets really short of breath. He has a seat in his shower so he does not have to stand. He wears his oxygen while he showers. Informed him to take his showers more slowly and do PLB while doing so.     Expected Outcomes Short: Attend [facility] regularly to improve shortness of breath with ADL's. Long: maintain independence with ADL's Short: Continue to monitor weight closely Long; Contiue to monitor risk factors Short: take showers more slowly. Long: take showers more efficiently to keep shortness of breath down.            Core Components/Risk Factors/Patient Goals at Discharge (Final Review):   Goals and Risk Factor Review - 12/01/20 1442      Core Components/Risk Factors/Patient Goals Review   Personal Goals Review Weight Management/Obesity;Improve shortness of breath with ADL's    Review Hal would like to gain more weight but has been staying the same since he started the  program. He states that taking a shower is hard for him and gets really short of breath. He has a seat in his shower so he does not have to stand. He wears his  oxygen while he showers. Informed him to take his showers more slowly and do PLB while doing so.    Expected Outcomes Short: take showers more slowly. Long: take showers more efficiently to keep shortness of breath down.           ITP Comments:  ITP Comments    Row Name 09/12/20 1152 09/15/20 1336 09/19/20 1410 09/21/20 0658 10/19/20 1001   ITP Comments Virtual orientation call completed today. he has an appointment on Date: 09/15/2020 for EP eval and gym Orientation.  Documentation of diagnosis can be found in [facility]  Date: 05/31/2020. Completed 6MWT and gym orientation. Initial ITP created and sent for review to Dr. Emily Filbert, Medical Director. First full day of exercise!  Patient was oriented to gym and equipment including functions, settings, policies, and procedures.  Patient's individual exercise prescription and treatment plan were reviewed.  All starting workloads were established based on the results of the 6 minute walk test done at initial orientation visit.  The plan for exercise progression was also introduced and progression will be customized based on patient's performance and goals. 30 Day review completed. Medical Director ITP review done, changes made as directed, and signed approval by Medical Director.  New to program 30 Day review completed. Medical Director ITP review done, changes made as directed, and signed approval by Medical Director.   [place] Name 11/16/20 0615 12/12/20 1500         ITP Comments 30 Day review completed. Medical Director ITP review done, changes made as directed, and signed approval by Medical Director. Hal would like to graduate early due to not being able to come for 2 weeks.             Comments: Discharge ITP

## 2020-12-14 ENCOUNTER — Ambulatory Visit: Payer: Medicare Other | Admitting: Cardiology

## 2020-12-14 ENCOUNTER — Ambulatory Visit: Payer: Medicare Other

## 2020-12-14 ENCOUNTER — Encounter: Payer: Self-pay | Admitting: Cardiology

## 2020-12-14 ENCOUNTER — Other Ambulatory Visit: Payer: Self-pay

## 2020-12-14 VITALS — BP 132/62 | HR 90 | Ht 70.6 in | Wt 148.0 lb

## 2020-12-14 DIAGNOSIS — I2510 Atherosclerotic heart disease of native coronary artery without angina pectoris: Secondary | ICD-10-CM

## 2020-12-14 DIAGNOSIS — E782 Mixed hyperlipidemia: Secondary | ICD-10-CM | POA: Diagnosis not present

## 2020-12-14 DIAGNOSIS — J431 Panlobular emphysema: Secondary | ICD-10-CM

## 2020-12-14 DIAGNOSIS — I10 Essential (primary) hypertension: Secondary | ICD-10-CM

## 2020-12-14 DIAGNOSIS — J9611 Chronic respiratory failure with hypoxia: Secondary | ICD-10-CM | POA: Diagnosis not present

## 2020-12-14 DIAGNOSIS — J449 Chronic obstructive pulmonary disease, unspecified: Secondary | ICD-10-CM | POA: Diagnosis not present

## 2020-12-14 NOTE — Patient Instructions (Signed)

## 2020-12-14 NOTE — Progress Notes (Signed)
Cardiology Office Note:    Date:  12/14/2020   ID:  [name] [name]., DOB Sep 15, 1940, MRN [id number]  PCP:  Josetta Huddle, MD  Cardiologist:  Jenean Lindau, MD   Referring MD: Josetta Huddle, MD    ASSESSMENT:    1. Atherosclerosis of native coronary artery of native heart without angina pectoris   2. Essential (primary) hypertension   3. Mixed hyperlipidemia   4. Panlobular emphysema ([place])    PLAN:    In order of problems listed above:  1. Coronary artery disease: Secondary prevention stressed with the patient.  Importance of compliance with diet medication stressed any vocalized understanding he is an active gentleman and is involved in pulmonary rehab and enjoys it. 2. Essential hypertension: Blood pressure stable and diet was emphasized. 3. Mixed dyslipidemia: Lipids were reviewed from [facility] notes.  I told him to have his doctor send me a copy of his records of blood work done.  This is coming up soon. 4. Patient will be seen in follow-up appointment in 6 months or earlier if the patient has any concerns.  I ensured that he has nitroglycerin and knows how to use it.   Medication Adjustments/Labs and Tests Ordered: Current medicines are reviewed at length with the patient today.  Concerns regarding medicines are outlined above.  No orders of the defined types were placed in this encounter.  No orders of the defined types were placed in this encounter.    No chief complaint on file.    History of Present Illness:    [name] [name]. is a 80 y.o. male.  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  He denies any problems at this time.  His problems and challenges are mainly related to shortness of breath from COPD.  No chest pain orthopnea or PND.  At the time of my evaluation, the patient is alert awake oriented and in no distress.  Past Medical History:  Diagnosis Date  .  Abdominal discomfort 12/07/2020  . Acquired trigger finger of right middle finger 02/04/2020  . Acute prostatitis 04/08/2018  . Alcohol abuse   . Alcohol dependence ([place]) 12/07/2020  . Anal or rectal pain 04/08/2018  . Annual physical exam 12/07/2020  . Anorectal disorder 12/07/2020  . Arthritis   . Atherosclerotic heart disease of native coronary artery without angina pectoris 04/08/2018  . Benign prostatic hyperplasia with lower urinary tract symptoms 12/07/2020  . BPH with elevated PSA   . 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 Chronic obstructive pulmonary disease with (acute) exacerbation ([place]) 12/07/2020  . Chronic respiratory failure with hypoxia ([place]) 08/10/2019  . Chronic sinusitis 12/07/2020  . Cigarette nicotine dependence, uncomplicated 11/05/8117  . Coagulation disorder ([place]) 01/13/2019  . 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 Contracture of palmar fascia 12/07/2020  . COPD (chronic obstructive pulmonary disease) ([place])   . Corn of toe 12/07/2020  . Corns and callosities 04/09/2018  . Coronary artery disease    2019 with stents  . Coronary atherosclerosis 05/07/2018  . Diarrhea 04/09/2018  . Double vision with both eyes open 12/07/2020  . 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 Dysphagia 08/14/2018  . Dyspnea   . ED (erectile dysfunction)   . ED (erectile dysfunction) of organic origin 12/07/2020  . Elevated prostate specific antigen (PSA) 04/08/2018  . Elevated PSA   .  Encounter for screening for other disorder 12/07/2020  . Enlarged prostate without lower urinary tract symptoms (luts) 04/09/2018  . Enthesopathy 12/07/2020  . Essential (primary) hypertension 04/08/2018  . Ex-smoker 04/09/2018  . Exertional dyspnea 04/02/2018  . 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 Flatulence 04/08/2018  . Gastro-esophageal reflux disease without esophagitis 04/08/2018  . GERD (gastroesophageal reflux disease)   . History of acute otitis  externa 08/14/2018  . Hyperlipidemia   . Hypertension   . Hypoxemia 12/07/2020  . Kidney cysts   . Left knee pain 12/07/2020  . Low back pain 04/08/2018  . Male erectile dysfunction 04/09/2018  . Mixed hyperlipidemia 04/08/2018  . Nocturia 04/09/2018  . Osteoarthritis of first carpometacarpal joint 04/08/2018  . Osteoarthritis of knee 12/07/2020  . Other long term (current) drug therapy 12/07/2020  . Pain due to onychomycosis of toenail of left foot 07/20/2019  . Pain in joint 04/09/2018  . Pain in knee 04/09/2018  . Pain in unspecified knee 12/07/2020  . Pain of finger 04/09/2018  . Palpitations   . 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[place]) 12/07/2020  . Personal history of colonic polyps 12/07/2020  . Pneumonia   . Pneumothorax 04/12/2020  . Polypharmacy 04/09/2018  . Porokeratosis 01/13/2019  . 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[place]) 12/07/2020  . Pulmonary nodules 06/08/2016  . Pure hypercholesterolemia 12/07/2020  . Renal cyst 12/07/2020  . Sleep disorder 04/08/2018  . Status post bronchoscopy 04/12/2020  . Tear of rotator cuff 04/09/2018  . Tobacco user 12/07/2020  . Uncomplicated alcohol dependence ([place]) 04/09/2018  . Unspecified rotator cuff tear or rupture of unspecified shoulder, not specified as traumatic 12/07/2020  . Unspecified tear of unspecified meniscus, current injury, unspecified knee, initial encounter 12/07/2020  . Visual disturbance 12/07/2020  . Vitamin D deficiency 04/08/2018    Past Surgical History:  Procedure Laterality Date  . BRONCHIAL BIOPSY  10/13/2019   Procedure: BRONCHIAL BIOPSIES;  Surgeon: Collene Gobble, MD;  Location: [facility] ENDOSCOPY;  Service: Pulmonary;;  . BRONCHIAL BIOPSY  04/12/2020   Procedure: BRONCHIAL BIOPSIES;  Surgeon: Collene Gobble, MD;  Location: [facility] ENDOSCOPY;  Service: Pulmonary;;  . BRONCHIAL BRUSHINGS  10/13/2019   Procedure: BRONCHIAL BRUSHINGS;  Surgeon: Collene Gobble, MD;  Location: [facility] ENDOSCOPY;  Service: Pulmonary;;  . BRONCHIAL BRUSHINGS  04/12/2020   Procedure:  BRONCHIAL BRUSHINGS;  Surgeon: Collene Gobble, MD;  Location: [facility] ENDOSCOPY;  Service: Pulmonary;;  . BRONCHIAL NEEDLE ASPIRATION BIOPSY  10/13/2019   Procedure: BRONCHIAL NEEDLE ASPIRATION BIOPSIES;  Surgeon: Collene Gobble, MD;  Location: [facility] ENDOSCOPY;  Service: Pulmonary;;  . BRONCHIAL NEEDLE ASPIRATION BIOPSY  04/12/2020   Procedure: BRONCHIAL NEEDLE ASPIRATION BIOPSIES;  Surgeon: Collene Gobble, MD;  Location: [facility] ENDOSCOPY;  Service: Pulmonary;;  . BRONCHIAL WASHINGS  10/13/2019   Procedure: BRONCHIAL WASHINGS;  Surgeon: Collene Gobble, MD;  Location: [place];  Service: Pulmonary;;  . BRONCHIAL WASHINGS  04/12/2020   Procedure: BRONCHIAL WASHINGS;  Surgeon: Collene Gobble, MD;  Location: [place];  Service: Pulmonary;;  . CARDIAC CATHETERIZATION  2002   50% RCA  . CATARACT EXTRACTION, BILATERAL    . CORONARY STENT INTERVENTION N/A 04/15/2018   Procedure: CORONARY STENT INTERVENTION;  Surgeon: Jettie Booze, MD;  Location: [place] CV LAB;  Service: Cardiovascular;  Laterality: N/A;  om1  . EYE SURGERY    . KNEE ARTHROSCOPY    . LEFT HEART CATH AND CORONARY ANGIOGRAPHY N/A 04/15/2018   Procedure: LEFT HEART CATH AND CORONARY ANGIOGRAPHY;  Surgeon: Jettie Booze,  MD;  Location: [place] CV LAB;  Service: Cardiovascular;  Laterality: N/A;  . MULTIPLE TOOTH EXTRACTIONS    . TONSILLECTOMY    . VIDEO BRONCHOSCOPY  04/12/2020  . VIDEO BRONCHOSCOPY WITH ENDOBRONCHIAL NAVIGATION N/A 07/26/2016   Procedure: VIDEO BRONCHOSCOPY WITH ENDOBRONCHIAL NAVIGATION;  Surgeon: Collene Gobble, MD;  Location: [place];  Service: Thoracic;  Laterality: N/A;  . VIDEO BRONCHOSCOPY WITH ENDOBRONCHIAL NAVIGATION N/A 10/13/2019   Procedure: [facility] AND VIDEO BRONCHOSCOPY WITH ENDOBRONCHIAL NAVIGATION;  Surgeon: Collene Gobble, MD;  Location: [place] ENDOSCOPY;  Service: Pulmonary;  Laterality: N/A;  . VIDEO BRONCHOSCOPY WITH ENDOBRONCHIAL NAVIGATION N/A 04/12/2020   Procedure: VIDEO BRONCHOSCOPY  WITH ENDOBRONCHIAL NAVIGATION;  Surgeon: Collene Gobble, MD;  Location: [place] ENDOSCOPY;  Service: Pulmonary;  Laterality: N/A;    Current Medications: Current Meds  Medication Sig  . albuterol (PROVENTIL) (2.5 MG/3ML) 0.083% nebulizer solution Take 3 mLs (2.5 mg total) by nebulization every 6 (six) hours as needed for wheezing or shortness of breath.  Marland Kitchen albuterol (VENTOLIN HFA) 108 (90 Base) MCG/ACT inhaler Inhale 2 puffs into the lungs 3 (three) times daily as needed for wheezing or shortness of breath.  . Budeson-Glycopyrrol-Formoterol (BREZTRI AEROSPHERE) 160-9-4.8 MCG/ACT AERO Inhale 2 puffs into the lungs 2 (two) times daily.  . cholecalciferol (VITAMIN D3) 25 MCG (1000 UNIT) tablet Take 1,000 Units by mouth 3 (three) times a week.   . clopidogrel (PLAVIX) 75 MG tablet Take 1 tablet (75 mg total) by mouth daily. Ok to restart this medication on Wednesday 04/13/20.  Marland Kitchen esomeprazole (NEXIUM) 20 MG capsule Take 20 mg by mouth daily.   . Ipratropium-Albuterol (COMBIVENT RESPIMAT) 20-100 MCG/ACT AERS respimat Inhale 1-2 puffs into the lungs every 6 (six) hours as needed for wheezing or shortness of breath.  . Melatonin 5 MG CAPS Take 5 mg by mouth at bedtime.  . nitroGLYCERIN (NITROSTAT) 0.4 MG SL tablet Place 1 tablet (0.4 mg total) under the tongue every 5 (five) minutes as needed for chest pain.  . predniSONE (DELTASONE) 5 MG tablet Take 5 mg by mouth daily.  . rosuvastatin (CRESTOR) 20 MG tablet Take 20 mg by mouth daily.  . tamsulosin (FLOMAX) 0.4 MG CAPS capsule Take 0.4 mg by mouth daily.     Allergies:   Patient has no known allergies.   Social History   Socioeconomic History  . Marital status: Married    Spouse name: Not on file  . Number of children: Not on file  . Years of education: Not on file  . Highest education level: Not on file  Occupational History  . Not on file  Tobacco Use  . Smoking status: Former Smoker    Packs/day: 0.75    Years: 67.00    Pack years: 50.25     Types: Cigarettes    Start date: 75    Quit date: 07/13/2020    Years since quitting: 0.4  . Smokeless tobacco: Never Used  . Tobacco comment: Recent Quit    Vaping Use  . Vaping Use: Former  Substance and Sexual Activity  . Alcohol use: No    Alcohol/week: 0.0 standard drinks    Comment: quit drinking 2014  . Drug use: No  . Sexual activity: Not on file  Other Topics Concern  . Not on file  Social History Narrative  . Not on file   Social Determinants of Health   Financial Resource Strain: Not on file  Food Insecurity: Not on file  Transportation Needs: Not on file  Physical Activity: Not on file  Stress: Not on file  Social Connections: Not on file     Family History: The patient's family history includes Alzheimer's disease in his mother; Hypertension in his mother.  ROS:   Please see the history of present illness.    All other systems reviewed and are negative.  EKGs/Labs/Other Studies Reviewed:    The following studies were reviewed today: CORONARY STENT INTERVENTION  LEFT HEART CATH AND CORONARY ANGIOGRAPHY    Conclusion    Prox RCA lesion is 25% stenosed.  1st RPLB lesion is 50% stenosed. THis is a very small vessel.  Mid LAD lesion is 25% stenosed.  1st Mrg lesion is 90% stenosed.  A drug-eluting stent was successfully placed using a STENT SYNERGY DES 2.5X24.  Post intervention, there is a 0% residual stenosis.  Ost 1st Mrg lesion is 70% stenosed.  A drug-eluting stent was successfully placed using a STENT SYNERGY DES 2.75X8.  Post intervention, there is a 0% residual stenosis.  The left ventricular systolic function is normal.  LV end diastolic pressure is normal.  The left ventricular ejection fraction is 55-65% by visual estimate.  There is no aortic valve stenosis.     Recommend uninterrupted dual antiplatelet therapy with Aspirin 81mg  daily and Clopidogrel 75mg  daily for a minimum of 6 months (stable ischemic heart  disease - Class I recommendation).   Continue aggressive secondary prevention.     Recent Labs: 04/12/2020: ALT <5; BUN 14; Creatinine, Ser 1.18; Hemoglobin 14.7; Platelets 253; Potassium 3.7; Sodium 140  Recent Lipid Panel    Component Value Date/Time   CHOL 122 06/06/2018 1012   TRIG 54 06/06/2018 1012   HDL 61 06/06/2018 1012   CHOLHDL 2.0 06/06/2018 1012   LDLCALC 50 06/06/2018 1012    Physical Exam:    VS:  BP 132/62   Pulse 90   Ht 5' 10.6" (1.793 m)   Wt 148 lb (67.1 kg)   SpO2 96%   BMI 20.88 kg/m     Wt Readings from Last 3 Encounters:  12/14/20 148 lb (67.1 kg)  09/15/20 150 lb 3.2 oz (68.1 kg)  05/31/20 148 lb 3.2 oz (67.2 kg)     GEN: Patient is in no acute distress HEENT: Normal NECK: No JVD; No carotid bruits LYMPHATICS: No lymphadenopathy CARDIAC: Hear sounds regular, 2/6 systolic murmur at the apex. RESPIRATORY:  Clear to auscultation without rales, wheezing or rhonchi  ABDOMEN: Soft, non-tender, non-distended MUSCULOSKELETAL:  No edema; No deformity  SKIN: Warm and dry NEUROLOGIC:  Alert and oriented x 3 PSYCHIATRIC:  Normal affect   Signed, Jenean Lindau, MD  12/14/2020 2:40 PM    [facility]

## 2020-12-15 ENCOUNTER — Ambulatory Visit: Payer: Medicare Other

## 2020-12-16 NOTE — Telephone Encounter (Signed)
Pt scheduled  to see Dr Lamonte Sakai on Thursday  June 9th at 11:15 am.

## 2020-12-19 ENCOUNTER — Ambulatory Visit: Payer: Medicare Other

## 2020-12-21 ENCOUNTER — Ambulatory Visit: Payer: Medicare Other

## 2020-12-22 ENCOUNTER — Ambulatory Visit: Payer: Medicare Other

## 2021-01-05 ENCOUNTER — Other Ambulatory Visit: Payer: Self-pay

## 2021-01-05 ENCOUNTER — Encounter: Payer: Self-pay | Admitting: Emergency Medicine

## 2021-01-05 ENCOUNTER — Ambulatory Visit: Payer: Medicare Other | Admitting: Emergency Medicine

## 2021-01-05 DIAGNOSIS — J431 Panlobular emphysema: Secondary | ICD-10-CM

## 2021-01-05 DIAGNOSIS — R918 Other nonspecific abnormal finding of lung field: Secondary | ICD-10-CM

## 2021-01-05 DIAGNOSIS — J9611 Chronic respiratory failure with hypoxia: Secondary | ICD-10-CM | POA: Diagnosis not present

## 2021-01-05 NOTE — Assessment & Plan Note (Signed)
Continue your oxygen at 2-4L/min depending on activity

## 2021-01-05 NOTE — Progress Notes (Signed)
Subjective:    Patient ID: [name] Kemps., male    DOB: 08/28/1940, 80 y.o.   MRN: [id number]  COPD He complains of shortness of breath. There is no cough or wheezing. Pertinent negatives include no ear pain, fever, headaches, postnasal drip, rhinorrhea, sneezing, sore throat or trouble swallowing. His past medical history is significant for COPD.    ROV 05/31/20 --80 year old man with pulmonary nodule disease which she had been following with serial imaging and also have investigated with repeat bronchoscopies. His last was on 04/12/2020. Again no malignancy was found. There may have been some hint of granulomatous inflammation on initial read although this was not included in the formal pathology report. HSP panel negative 04/21/20.  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 Based on his progressive dyspnea and a possible granulomatous disease I committed to treating him with a 3-week course of prednisone. He reports today that his breathing is largely unchanged.  He is using Breztri reliably, uses combivent a few times a day. Sometimes he uses left over Dulera prn.   ROV 01/05/21 --follow-up visit for 80 year old gentleman with severe COPD, severe obstruction with associated chronic hypoxemic respiratory failure also with pulmonary nodular disease that has been investigated with multiple bronchoscopies.  No malignancy found but question hint of granulomatous inflammation.  HSP panel negative, I treated him empirically with steroids in the past.  Currently maintained on Breztri, Combivent as needed - 0-2x a day. He is on pred 5mg , sometimes he takes 10mg  depending on his breathing.  His wt has stabilized some. He completed pulm rehab. No flares or abx.   CT chest 02/26/2020 reviewed, showed multiple bilateral irregular peribronchovascular nodules in postinflammatory infectious pattern.                          Review of Systems   Constitutional:  Negative for fever and unexpected weight change.  HENT:  Negative for congestion, dental problem, ear pain, nosebleeds, postnasal drip, rhinorrhea, sinus pressure, sneezing, sore throat and trouble swallowing.   Eyes:  Negative for redness and itching.  Respiratory:  Positive for shortness of breath. Negative for cough, chest tightness and wheezing.   Cardiovascular:  Negative for palpitations and leg swelling.  Gastrointestinal:  Negative for nausea and vomiting.  Genitourinary:  Negative for dysuria.  Musculoskeletal:  Negative for joint swelling.  Skin:  Negative for rash.  Neurological:  Negative for headaches.  Hematological:  Does not bruise/bleed easily.  Psychiatric/Behavioral:  Negative for dysphoric mood. The patient is not nervous/anxious.       Objective:   Physical Exam Vitals:   01/05/21 1132  BP: 118/70  Pulse: 84  Temp: 98.1 F (36.7 C)  TempSrc: Temporal  SpO2: 98%  Weight: 148 lb (67.1 kg)  Height: 5\' 10"  (1.778 m)   Gen: Pleasant, well-nourished, in no distress,  normal affect  ENT: No lesions,  mouth clear,  oropharynx clear, no postnasal drip  Neck: No JVD, mild UA noise  Lungs: No use of accessory muscles, distant,  no crackles or wheeze today  Cardiovascular: RRR, heart sounds normal, no murmur or gallops, no peripheral edema  Musculoskeletal: No deformities, no cyanosis or clubbing  Neuro: alert, non focal  Skin: Warm, no lesions or rash      Assessment & Plan:  COPD (chronic obstructive pulmonary disease) (HCC) No flares.  On maintenance prednisone.  Remains fairly limited.  He needs to get back  to his exercise regimen and he is planning to do so.  Please continue Breztri 2 puffs twice a day.  Rinse and gargle after using. Keep albuterol available to use 2 puffs up to every 4 hours if needed for shortness of breath, chest tightness, wheezing.  Continue your prednisone 5mg  daily Follow with Dr. Lamonte Sakai in July after your CT  scan so that we can review results together.  Chronic respiratory failure with hypoxia (HCC) Continue your oxygen at 2-4L/min depending on activity  Pulmonary nodules Multiple bronchoscopies, no malignancy, question granulomatous process blood cultures negative.  We will plan to repeat his CT chest in July to look for interval stability, change.  Baltazar Apo, MD, PhD 01/05/2021, 11:53 AM [facility] [phone] or if no answer [phone]

## 2021-01-05 NOTE — Assessment & Plan Note (Signed)
No flares.  On maintenance prednisone.  Remains fairly limited.  He needs to get back to his exercise regimen and he is planning to do so.  Please continue Breztri 2 puffs twice a day.  Rinse and gargle after using. Keep albuterol available to use 2 puffs up to every 4 hours if needed for shortness of breath, chest tightness, wheezing.  Continue your prednisone 5mg  daily Follow with Dr. Lamonte Sakai in July after your CT scan so that we can review results together.

## 2021-01-05 NOTE — Patient Instructions (Addendum)
Please continue Breztri 2 puffs twice a day.  Rinse and gargle after using. Keep albuterol available to use 2 puffs up to every 4 hours if needed for shortness of breath, chest tightness, wheezing.  Continue your prednisone 5mg  daily Continue your oxygen at 2-4L/min depending on activity We will repeat your your CT chest in July to compare with priors Follow with Dr. Lamonte Sakai in July after your CT scan so that we can review results together.

## 2021-01-05 NOTE — Assessment & Plan Note (Signed)
Multiple bronchoscopies, no malignancy, question granulomatous process blood cultures negative.  We will plan to repeat his CT chest in July to look for interval stability, change.

## 2021-01-10 DIAGNOSIS — R0902 Hypoxemia: Secondary | ICD-10-CM | POA: Diagnosis not present

## 2021-01-10 DIAGNOSIS — G479 Sleep disorder, unspecified: Secondary | ICD-10-CM | POA: Diagnosis not present

## 2021-01-10 DIAGNOSIS — I739 Peripheral vascular disease, unspecified: Secondary | ICD-10-CM | POA: Diagnosis not present

## 2021-01-10 DIAGNOSIS — J439 Emphysema, unspecified: Secondary | ICD-10-CM | POA: Diagnosis not present

## 2021-01-10 DIAGNOSIS — J9611 Chronic respiratory failure with hypoxia: Secondary | ICD-10-CM | POA: Diagnosis not present

## 2021-01-10 DIAGNOSIS — K219 Gastro-esophageal reflux disease without esophagitis: Secondary | ICD-10-CM | POA: Diagnosis not present

## 2021-01-10 DIAGNOSIS — I2510 Atherosclerotic heart disease of native coronary artery without angina pectoris: Secondary | ICD-10-CM | POA: Diagnosis not present

## 2021-01-11 ENCOUNTER — Ambulatory Visit (INDEPENDENT_AMBULATORY_CARE_PROVIDER_SITE_OTHER): Payer: Medicare Other | Admitting: Urology

## 2021-01-11 ENCOUNTER — Other Ambulatory Visit: Payer: Self-pay

## 2021-01-11 ENCOUNTER — Encounter: Payer: Self-pay | Admitting: Urology

## 2021-01-11 VITALS — BP 107/64 | HR 88 | Ht 70.0 in | Wt 147.0 lb

## 2021-01-11 DIAGNOSIS — R972 Elevated prostate specific antigen [PSA]: Secondary | ICD-10-CM

## 2021-01-11 DIAGNOSIS — N401 Enlarged prostate with lower urinary tract symptoms: Secondary | ICD-10-CM

## 2021-01-11 MED ORDER — MIRABEGRON ER 25 MG PO TB24: 25.0000 mg | ORAL_TABLET | Freq: Every day | ORAL | 0 refills | Status: DC

## 2021-01-11 NOTE — Progress Notes (Signed)
01/11/2021 2:02 PM   [name] [name]. 20-Jun-1941 [id number]  Referring provider: Josetta Huddle, MD [facility] [employer] [address],  [place] [address]  Chief Complaint  Patient presents with   Elevated PSA    HPI: [name] [name] is a 80 y.o. male requesting transfer of urologic care from [place].  Referred to Dr. Gloriann Loan 07/16/2019 for a PSA of 8.22 which had increased from a prior PSA of 6.44 in October 2020 PSA January 2012 was 2.43 Bothersome LUTS with IPSS 25/35 Noted to have firmness and nodularity of the left apex on DRE Shared decision-making was carried out with Dr. Gloriann Loan and based on his significant comorbidities and life expectancy of <10 years he elected not to pursue prostate biopsy Follow-up visit 10/17/2019 for left flank and left lower quadrant pain.  Renal ultrasound showed a 3 mm left midpole stone and a 7 cm simple right renal cyst He also had a PET CT which showed a hypermetabolic nodule in the right lower lobe suspicious for malignancy Was last seen by Dr. Gloriann Loan 01/21/2020 and PSA and increased to 10.4 Negative biopsy on the lung nodule which is being observed At that visit he desired to continue observation; tamsulosin started for his voiding symptoms Still with bothersome LUTS on tamsulosin with his most bothersome symptoms being frequency, urgency with urge incontinence and nocturia x3 Remains on tamsulosin He was inquiring about starting medication to shrink the prostate Denies gross hematuria No flank, abdominal or pelvic pain   PMH: Past Medical History:  Diagnosis Date   Abdominal discomfort 12/07/2020   Acquired trigger finger of right middle finger 02/04/2020   Acute prostatitis 04/08/2018   Alcohol abuse    Alcohol dependence ([place]) 12/07/2020   Anal or rectal pain 04/08/2018   Annual physical exam 12/07/2020   Anorectal disorder 12/07/2020   Arthritis    Atherosclerotic heart disease of native coronary artery without angina pectoris  04/08/2018   Benign prostatic hyperplasia with lower urinary tract symptoms 12/07/2020   BPH with elevated PSA    Cancer ([place])    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   Chronic obstructive pulmonary disease with (acute) exacerbation ([place]) 12/07/2020   Chronic respiratory failure with hypoxia ([place]) 08/10/2019   Chronic sinusitis 12/07/2020   Cigarette nicotine dependence, uncomplicated 03/16/2992   Coagulation disorder ([place]) 01/13/2019   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   Contracture of palmar fascia 12/07/2020   COPD (chronic obstructive pulmonary disease) ([place])    Corn of toe 12/07/2020   Corns and callosities 04/09/2018   Coronary artery disease    2019 with stents   Coronary atherosclerosis 05/07/2018   Diarrhea 04/09/2018   Double vision with both eyes open 12/07/2020   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   Dysphagia 08/14/2018   Dyspnea    ED (erectile dysfunction)    ED (erectile dysfunction) of organic origin 12/07/2020   Elevated prostate specific antigen (PSA) 04/08/2018   Elevated PSA    Encounter for screening for other disorder 12/07/2020   Enlarged prostate without lower urinary tract symptoms (luts) 04/09/2018   Enthesopathy 12/07/2020   Essential (primary) hypertension 04/08/2018   Ex-smoker 04/09/2018   Exertional dyspnea 04/02/2018   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   Flatulence 04/08/2018   Gastro-esophageal reflux disease without esophagitis 04/08/2018   GERD (gastroesophageal reflux disease)    History of acute otitis externa 08/14/2018   Hyperlipidemia    Hypertension    Hypoxemia 12/07/2020  Kidney cysts    Left knee pain 12/07/2020   Low back pain 04/08/2018   Male erectile dysfunction 04/09/2018   Mixed hyperlipidemia 04/08/2018   Nocturia 04/09/2018   Osteoarthritis of first carpometacarpal joint 04/08/2018   Osteoarthritis of knee 12/07/2020   Other long term (current) drug therapy 12/07/2020   Pain due to  onychomycosis of toenail of left foot 07/20/2019   Pain in joint 04/09/2018   Pain in knee 04/09/2018   Pain in unspecified knee 12/07/2020   Pain of finger 04/09/2018   Palpitations    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[place]) 12/07/2020   Personal history of colonic polyps 12/07/2020   Pneumonia    Pneumothorax 04/12/2020   Polypharmacy 04/09/2018   Porokeratosis 01/13/2019   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   Pulmonary nodules 06/08/2016   Pure hypercholesterolemia 12/07/2020   Renal cyst 12/07/2020   Sleep disorder 04/08/2018   Status post bronchoscopy 04/12/2020   Tear of rotator cuff 04/09/2018   Tobacco user 3/50/0938   Uncomplicated alcohol dependence ([place]) 04/09/2018   Unspecified rotator cuff tear or rupture of unspecified shoulder, not specified as traumatic 12/07/2020   Unspecified tear of unspecified meniscus, current injury, unspecified knee, initial encounter 12/07/2020   Visual disturbance 12/07/2020   Vitamin D deficiency 04/08/2018    Surgical History: Past Surgical History:  Procedure Laterality Date   BRONCHIAL BIOPSY  10/13/2019   Procedure: BRONCHIAL BIOPSIES;  Surgeon: Collene Gobble, MD;  Location: [facility];  Service: Pulmonary;;   BRONCHIAL BIOPSY  04/12/2020   Procedure: BRONCHIAL BIOPSIES;  Surgeon: Collene Gobble, MD;  Location: [facility];  Service: Pulmonary;;   BRONCHIAL BRUSHINGS  10/13/2019   Procedure: BRONCHIAL BRUSHINGS;  Surgeon: Collene Gobble, MD;  Location: [facility] ENDOSCOPY;  Service: Pulmonary;;   BRONCHIAL BRUSHINGS  04/12/2020   Procedure: BRONCHIAL BRUSHINGS;  Surgeon: Collene Gobble, MD;  Location: [facility] ENDOSCOPY;  Service: Pulmonary;;   BRONCHIAL NEEDLE ASPIRATION BIOPSY  10/13/2019   Procedure: BRONCHIAL NEEDLE ASPIRATION BIOPSIES;  Surgeon: Collene Gobble, MD;  Location: [facility] ENDOSCOPY;  Service: Pulmonary;;   BRONCHIAL NEEDLE ASPIRATION BIOPSY  04/12/2020   Procedure: BRONCHIAL NEEDLE ASPIRATION BIOPSIES;  Surgeon: Collene Gobble, MD;  Location[facility]  ENDOSCOPY;  Service: Pulmonary;;   BRONCHIAL WASHINGS  10/13/2019   Procedure: BRONCHIAL WASHINGS;  Surgeon: Collene Gobble, MD;  [facility];  Service: Pulmonary;;   BRONCHIAL WASHINGS  04/12/2020   Procedure: BRONCHIAL WASHINGS;  Surgeon: Collene Gobble, MD;  Location: [facility] ENDOSCOPY;  Service: Pulmonary;;   CARDIAC CATHETERIZATION  2002   50% RCA   CATARACT EXTRACTION, BILATERAL     CORONARY STENT INTERVENTION N/A 04/15/2018   Procedure: CORONARY STENT INTERVENTION;  Surgeon: Jettie Booze, MD;  Location: [facility] CV LAB;  Service: Cardiovascular;  Laterality: N/A;  om1   EYE SURGERY     KNEE ARTHROSCOPY     LEFT HEART CATH AND CORONARY ANGIOGRAPHY N/A 04/15/2018   Procedure: LEFT HEART CATH AND CORONARY ANGIOGRAPHY;  Surgeon: Jettie Booze, MD;  Location: [facility] CV LAB;  Service: Cardiovascular;  Laterality: N/A;   MULTIPLE TOOTH EXTRACTIONS     TONSILLECTOMY     VIDEO BRONCHOSCOPY  04/12/2020   VIDEO BRONCHOSCOPY WITH ENDOBRONCHIAL NAVIGATION N/A 07/26/2016   Procedure: VIDEO BRONCHOSCOPY WITH ENDOBRONCHIAL NAVIGATION;  Surgeon: Collene Gobble, MD;  Location: [place] [facility];  Service: Thoracic;  Laterality: N/A;   VIDEO BRONCHOSCOPY WITH ENDOBRONCHIAL NAVIGATION N/A 10/13/2019   Procedure: [facility] AND VIDEO BRONCHOSCOPY WITH ENDOBRONCHIAL NAVIGATION;  Surgeon: Collene Gobble, MD;  Location: [facility] ENDOSCOPY;  Service: Pulmonary;  Laterality: N/A;   VIDEO BRONCHOSCOPY WITH ENDOBRONCHIAL NAVIGATION N/A 04/12/2020   Procedure: VIDEO BRONCHOSCOPY WITH ENDOBRONCHIAL NAVIGATION;  Surgeon: Collene Gobble, MD;  Location: [place] [facility] ENDOSCOPY;  Service: Pulmonary;  Laterality: N/A;    Home Medications:  Allergies as of 01/11/2021       Reactions   Metronidazole Other (See Comments)   Other reaction(s): skin reaction        Medication List        Accurate as of January 11, 2021  2:02 PM. If you have any questions, ask your nurse or doctor.          STOP taking these  medications    esomeprazole 20 MG capsule Commonly known as: [place] by: Abbie Sons, MD       TAKE these medications    albuterol (2.5 MG/3ML) 0.083% nebulizer solution Commonly known as: PROVENTIL Take 3 mLs (2.5 mg total) by nebulization every 6 (six) hours as needed for wheezing or shortness of breath.   albuterol 108 (90 Base) MCG/ACT inhaler Commonly known as: VENTOLIN HFA Inhale 2 puffs into the lungs 3 (three) times daily as needed for wheezing or shortness of breath.   Breztri Aerosphere 160-9-4.8 MCG/ACT Aero Generic drug: Budeson-Glycopyrrol-Formoterol Inhale 2 puffs into the lungs 2 (two) times daily.   cholecalciferol 25 MCG (1000 UNIT) tablet Commonly known as: VITAMIN D3 Take 1,000 Units by mouth 3 (three) times a week.   clonazePAM 0.5 MG tablet Commonly known as: KLONOPIN Take 0.5 mg by mouth daily as needed.   clopidogrel 75 MG tablet Commonly known as: PLAVIX Take 1 tablet (75 mg total) by mouth daily. Ok to restart this medication on Wednesday 04/13/20.   Combivent Respimat 20-100 MCG/ACT Aers respimat Generic drug: Ipratropium-Albuterol Inhale 1-2 puffs into the lungs every 6 (six) hours as needed for wheezing or shortness of breath.   Melatonin 5 MG Caps Take 5 mg by mouth at bedtime.   nitroGLYCERIN 0.4 MG SL tablet Commonly known as: NITROSTAT Place 1 tablet (0.4 mg total) under the tongue every 5 (five) minutes as needed for chest pain.   pantoprazole 40 MG tablet Commonly known as: PROTONIX 1 tablet   predniSONE 5 MG tablet Commonly known as: DELTASONE Take 5 mg by mouth daily.   rosuvastatin 20 MG tablet Commonly known as: CRESTOR Take 20 mg by mouth daily.   tamsulosin 0.4 MG Caps capsule Commonly known as: FLOMAX Take 0.4 mg by mouth daily.   varenicline 1 MG tablet Commonly known as: CHANTIX Take by mouth.        Allergies:  Allergies  Allergen Reactions   Metronidazole Other (See Comments)    Other  reaction(s): skin reaction    Family History: Family History  Problem Relation Age of Onset   Hypertension Mother    Alzheimer's disease Mother     Social History:  reports that he quit smoking about 5 months ago. His smoking use included cigarettes. He started smoking about 68 years ago. He has a 50.25 pack-year smoking history. He has never used smokeless tobacco. He reports that he does not drink alcohol and does not use drugs.   Physical Exam: There were no vitals taken for this visit.  Constitutional:  Alert and oriented, No acute distress. HEENT: [place] AT, moist mucus membranes.  Trachea midline, no masses. Cardiovascular: No clubbing, cyanosis, or edema. Respiratory: Supplemental O2 GU: Prostate 45 g with nodularity/induration  lower one half on left Skin: No rashes, bruises or suspicious lesions. Neurologic: Grossly intact, no focal deficits, moving all 4 extremities. Psychiatric: Normal mood and affect.   Assessment & Plan:    1.  Elevated PSA Rising PSA and abnormal DRE He is aware he most likely has prostate cancer however based on his life expectancy and comorbidities he has elected not to pursue biopsy. PSA was repeated today  2.  BPH with LUTS He has severe LUTS which are bothersome His most bothersome symptoms at present are storage related voiding symptoms I discussed starting a 5-ARI however he was informed it would take at least 4-6 months to achieve efficacy I initially recommended a trial of Myrbetriq to see if this improves his storage voiding symptoms.  He was given samples and Rx sent to pharmacy to check for coverage Follow-up 6 months  I spent 45 total minutes on the day of the encounter including pre-visit review of the medical record, face-to-face time with the patient, and post visit ordering of labs/imaging/tests.   Abbie Sons, [place] [address] [place], [place] [address] [phone]

## 2021-01-12 ENCOUNTER — Encounter: Payer: Self-pay | Admitting: Urology

## 2021-01-12 ENCOUNTER — Encounter: Payer: Self-pay | Admitting: *Deleted

## 2021-01-12 ENCOUNTER — Ambulatory Visit: Payer: Medicare Other | Admitting: Podiatry

## 2021-01-12 ENCOUNTER — Encounter: Payer: Self-pay | Admitting: Podiatry

## 2021-01-12 DIAGNOSIS — M205X2 Other deformities of toe(s) (acquired), left foot: Secondary | ICD-10-CM | POA: Diagnosis not present

## 2021-01-12 DIAGNOSIS — D689 Coagulation defect, unspecified: Secondary | ICD-10-CM

## 2021-01-12 DIAGNOSIS — Q828 Other specified congenital malformations of skin: Secondary | ICD-10-CM

## 2021-01-12 LAB — PSA: Prostate Specific Ag, Serum: 13.3 ng/mL — ABNORMAL HIGH (ref 0.0–4.0)

## 2021-01-12 NOTE — Progress Notes (Signed)
This patient returns to my office for at risk foot care.  This patient requires this care by a professional since this patient will be at risk due to having  pvd and coagulation defect. Patient is taking plavix. Patient develops a painful callus under left hallux.  This patient presents for at risk foot care today.    General Appearance  Alert, conversant and in no acute stress.  Vascular  Dorsalis pedis and posterior tibial  pulses are  weakly palpable  bilaterally.  Capillary return is within normal limits  bilaterally. Cold feet  bilaterally. Absent digital hair.  Neurologic  Senn-Weinstein monofilament wire test within normal limits  bilaterally. Muscle power within normal limits bilaterally.  Nails Normal nails  Noted  B/L.  No evidence of bacterial infection or drainage bilaterally.  Orthopedic  No limitations of motion  feet .  No crepitus or effusions noted.  No bony pathology or digital deformities noted.HAV  B/L.Marland Kitchen  Hallux extensus fused left hallux.  Skin  normotropic skin with no porokeratosis noted bilaterally.  No signs of infections or ulcers noted.   Callus plantar aspect left hallux.  Porokeratosis left foot   Consent was obtained for treatment procedures.  Debridement of callus with # 15 blade.     Return office visit   prn                  Told patient to return for periodic foot care and evaluation due to potential at risk complications.   Gardiner Barefoot DPM

## 2021-01-14 DIAGNOSIS — J9611 Chronic respiratory failure with hypoxia: Secondary | ICD-10-CM | POA: Diagnosis not present

## 2021-01-14 DIAGNOSIS — J449 Chronic obstructive pulmonary disease, unspecified: Secondary | ICD-10-CM | POA: Diagnosis not present

## 2021-01-30 ENCOUNTER — Encounter: Payer: Self-pay | Admitting: Urology

## 2021-01-31 ENCOUNTER — Other Ambulatory Visit: Payer: Self-pay | Admitting: *Deleted

## 2021-01-31 MED ORDER — MIRABEGRON ER 25 MG PO TB24: 25.0000 mg | ORAL_TABLET | Freq: Every day | ORAL | 3 refills | Status: DC

## 2021-02-01 DIAGNOSIS — Z85828 Personal history of other malignant neoplasm of skin: Secondary | ICD-10-CM | POA: Diagnosis not present

## 2021-02-01 DIAGNOSIS — D692 Other nonthrombocytopenic purpura: Secondary | ICD-10-CM | POA: Diagnosis not present

## 2021-02-01 DIAGNOSIS — L853 Xerosis cutis: Secondary | ICD-10-CM | POA: Diagnosis not present

## 2021-02-01 DIAGNOSIS — L570 Actinic keratosis: Secondary | ICD-10-CM | POA: Diagnosis not present

## 2021-02-01 DIAGNOSIS — L820 Inflamed seborrheic keratosis: Secondary | ICD-10-CM | POA: Diagnosis not present

## 2021-02-13 DIAGNOSIS — J9611 Chronic respiratory failure with hypoxia: Secondary | ICD-10-CM | POA: Diagnosis not present

## 2021-02-13 DIAGNOSIS — J449 Chronic obstructive pulmonary disease, unspecified: Secondary | ICD-10-CM | POA: Diagnosis not present

## 2021-02-16 ENCOUNTER — Other Ambulatory Visit: Payer: Self-pay | Admitting: Primary Care

## 2021-02-16 MED ORDER — BREZTRI AEROSPHERE 160-9-4.8 MCG/ACT IN AERO: 2.0000 | INHALATION_SPRAY | Freq: Two times a day (BID) | RESPIRATORY_TRACT | 5 refills | Status: DC

## 2021-02-17 ENCOUNTER — Other Ambulatory Visit: Payer: Self-pay | Admitting: Cardiology

## 2021-02-17 NOTE — Telephone Encounter (Signed)
Rosuvastatin 20 mg # 90 x 1 refill sent to [id number]- [place], [place] - [address]

## 2021-02-21 NOTE — Telephone Encounter (Signed)
Super D CT order placed 02/16/21 as seen in [facility]. PCCs please advise

## 2021-02-21 NOTE — Telephone Encounter (Signed)
PCCs could you please advise on the following My Chart message:   Have not heard anything yet about the CT scan CT was supposed to be done in July.   Thank you

## 2021-02-21 NOTE — Telephone Encounter (Signed)
There needs to be an order placed for CT.  CT has not been ordered.

## 2021-02-22 ENCOUNTER — Telehealth: Payer: Self-pay | Admitting: Emergency Medicine

## 2021-02-22 DIAGNOSIS — R918 Other nonspecific abnormal finding of lung field: Secondary | ICD-10-CM

## 2021-02-22 NOTE — Telephone Encounter (Signed)
CT is scheduled at [facility] where pt has gone past 2 times.  Sched for 8/16 at 2:00.  Left vm for pt to call me back for appt info & in message made him aware he will need appt with RB to discuss results.

## 2021-02-22 NOTE — Telephone Encounter (Signed)
There is a super D CT chest that has already been ordered from 7/21 and is listed as 'pending' but was ordered under [employer]. New order placed for super D CT chest under Dr. Lamonte Sakai. Schedule for now. Pt needs OV after CT.   PCCs please advise when scan has been scheduled. Pt needs to OV to see RB after to review results.

## 2021-02-23 NOTE — Telephone Encounter (Signed)
Since RB had the opening 8/26 without having to use a held slot, went ahead and scheduled pt for that day at 1:45.nothing further needed.

## 2021-02-23 NOTE — Telephone Encounter (Signed)
Spoke to pt & gave him appt info.  Message states to advise when scheduled so pt can be made appt with RB.  I checked RB's schedule to see if he has something shortly after that but next opening is 8/26.  Routing back to triage to see if pt needs to be put in a hold spot.

## 2021-02-27 ENCOUNTER — Ambulatory Visit: Payer: Medicare Other | Admitting: Podiatry

## 2021-03-04 ENCOUNTER — Other Ambulatory Visit: Payer: Self-pay

## 2021-03-04 ENCOUNTER — Ambulatory Visit
Admission: RE | Admit: 2021-03-04 | Discharge: 2021-03-04 | Disposition: A | Discharge status: Home / Self Care | Payer: Medicare Other | Source: Ambulatory Visit | Attending: Emergency Medicine | Admitting: Emergency Medicine

## 2021-03-04 DIAGNOSIS — R918 Other nonspecific abnormal finding of lung field: Secondary | ICD-10-CM

## 2021-03-04 DIAGNOSIS — J439 Emphysema, unspecified: Secondary | ICD-10-CM | POA: Diagnosis not present

## 2021-03-04 DIAGNOSIS — I700 Atherosclerosis of aorta: Secondary | ICD-10-CM | POA: Diagnosis not present

## 2021-03-04 DIAGNOSIS — J449 Chronic obstructive pulmonary disease, unspecified: Secondary | ICD-10-CM | POA: Diagnosis not present

## 2021-03-04 DIAGNOSIS — R911 Solitary pulmonary nodule: Secondary | ICD-10-CM | POA: Diagnosis not present

## 2021-03-09 ENCOUNTER — Ambulatory Visit: Payer: Medicare Other | Admitting: Podiatry

## 2021-03-14 ENCOUNTER — Other Ambulatory Visit: Payer: Medicare Other

## 2021-03-16 ENCOUNTER — Other Ambulatory Visit: Payer: Self-pay

## 2021-03-16 ENCOUNTER — Encounter: Payer: Self-pay | Admitting: Podiatry

## 2021-03-16 ENCOUNTER — Ambulatory Visit: Payer: Medicare Other | Admitting: Podiatry

## 2021-03-16 DIAGNOSIS — Q828 Other specified congenital malformations of skin: Secondary | ICD-10-CM

## 2021-03-16 DIAGNOSIS — D689 Coagulation defect, unspecified: Secondary | ICD-10-CM

## 2021-03-16 DIAGNOSIS — M205X2 Other deformities of toe(s) (acquired), left foot: Secondary | ICD-10-CM

## 2021-03-16 DIAGNOSIS — J449 Chronic obstructive pulmonary disease, unspecified: Secondary | ICD-10-CM | POA: Diagnosis not present

## 2021-03-16 DIAGNOSIS — M25472 Effusion, left ankle: Secondary | ICD-10-CM | POA: Diagnosis not present

## 2021-03-16 DIAGNOSIS — J9611 Chronic respiratory failure with hypoxia: Secondary | ICD-10-CM | POA: Diagnosis not present

## 2021-03-16 HISTORY — DX: Effusion, left ankle: M25.472

## 2021-03-16 NOTE — Progress Notes (Signed)
This patient returns to my office for at risk foot care.  This patient requires this care by a professional since this patient will be at risk due to having  pvd and coagulation defect. Patient is taking plavix. Patient develops a painful callus under left hallux.  This patient presents for at risk foot care today.  Patient says he has been experiencing swelling at his ankle the last few months.  He says it is most noticeable at the end of the day.  General Appearance  Alert, conversant and in no acute stress.  Vascular  Dorsalis pedis and posterior tibial  pulses are  weakly palpable  bilaterally.  Capillary return is within normal limits  bilaterally. Cold feet  bilaterally. Absent digital hair.  Neurologic  Senn-Weinstein monofilament wire test within normal limits  bilaterally. Muscle power within normal limits bilaterally.  Nails Normal nails  Noted  B/L.  No evidence of bacterial infection or drainage bilaterally.  Orthopedic  No limitations of motion  feet .  No crepitus or effusions noted.  No bony pathology or digital deformities noted.HAV  B/L.Marland Kitchen  Hallux extensus fused left hallux.  Skin  normotropic skin with no porokeratosis noted bilaterally.  No signs of infections or ulcers noted.   Callus plantar aspect left hallux.  Porokeratosis left foot  Swelling left ankle   Consent was obtained for treatment procedures.  Debridement of callus with # 15 blade.  Discussed swelling with this patient.  Told him to wear anklet and apply anklet upon rising.   Return office visit   prn                  Told patient to return for periodic foot care and evaluation due to potential at risk complications.   Gardiner Barefoot DPM

## 2021-03-24 ENCOUNTER — Ambulatory Visit: Payer: Medicare Other | Admitting: Emergency Medicine

## 2021-03-24 ENCOUNTER — Other Ambulatory Visit: Payer: Self-pay

## 2021-03-24 ENCOUNTER — Encounter: Payer: Self-pay | Admitting: Emergency Medicine

## 2021-03-24 VITALS — BP 138/72 | HR 60 | Temp 98.6°F | Ht 70.5 in | Wt 150.8 lb

## 2021-03-24 DIAGNOSIS — J431 Panlobular emphysema: Secondary | ICD-10-CM | POA: Diagnosis not present

## 2021-03-24 DIAGNOSIS — R918 Other nonspecific abnormal finding of lung field: Secondary | ICD-10-CM

## 2021-03-24 DIAGNOSIS — J9611 Chronic respiratory failure with hypoxia: Secondary | ICD-10-CM

## 2021-03-24 MED ORDER — BREZTRI AEROSPHERE 160-9-4.8 MCG/ACT IN AERO: 2.0000 | INHALATION_SPRAY | Freq: Two times a day (BID) | RESPIRATORY_TRACT | 0 refills | Status: DC

## 2021-03-24 NOTE — Assessment & Plan Note (Signed)
Reviewed his most recent CT chest.  Waxing and waning pulmonary nodular disease with granulomas on one of his transbronchial biopsies.  HSP panel negative.  Could consider sarcoid.  Also will evaluate for other nodular processes, obtain screening ANCA, RF, ANA.  Plan to be more conservative with following serial imaging, cut back on frequency of CT scans to annually

## 2021-03-24 NOTE — Assessment & Plan Note (Signed)
Continue oxygen, 2-4 L/min pulsed.

## 2021-03-24 NOTE — Progress Notes (Signed)
Subjective:    Patient ID: [name] Kemps., male    DOB: 1940-11-18, 80 y.o.   MRN: [id number]  COPD He complains of shortness of breath. There is no cough or wheezing. Pertinent negatives include no ear pain, fever, headaches, postnasal drip, rhinorrhea, sneezing, sore throat or trouble swallowing. His past medical history is significant for COPD.    ROV 05/31/20 --80 year old man with pulmonary nodule disease which she had been following with serial imaging and also have investigated with repeat bronchoscopies. His last was on 04/12/2020. Again no malignancy was found. There may have been some hint of granulomatous inflammation on initial read although this was not included in the formal pathology report. HSP panel negative 04/21/20.  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 Based on his progressive dyspnea and a possible granulomatous disease I committed to treating him with a 3-week course of prednisone. He reports today that his breathing is largely unchanged.  He is using Breztri reliably, uses combivent a few times a day. Sometimes he uses left over Dulera prn.   ROV 01/05/21 --follow-up visit for 80 year old gentleman with severe COPD, severe obstruction with associated chronic hypoxemic respiratory failure also with pulmonary nodular disease that has been investigated with multiple bronchoscopies.  No malignancy found but question hint of granulomatous inflammation.  HSP panel negative, I treated him empirically with steroids in the past.  Currently maintained on Breztri, Combivent as needed - 0-2x a day. He is on pred '5mg'$ , sometimes he takes '10mg'$  depending on his breathing.  His wt has stabilized some. He completed pulm rehab. No flares or abx.   CT chest 02/26/2020 reviewed, showed multiple bilateral irregular peribronchovascular nodules in postinflammatory infectious pattern.   ROV 03/24/2021  -- [name] [name] is an 80 year old gentleman whom I  have followed for pulmonary nodular disease.  He is undergone serial bronchoscopies with nodular biopsies, no evidence of malignancy but possibly some granulomatous inflammation.  He also has severe obstruction and associated chronic hypoxemic respiratory failure.  Question whether his nodular process may be steroid responsive.  HSP panel 1 year ago negative.  He is on chronic prednisone 5-10 mg for his obstructive disease.  Currently maintained on Breztri, oxygen at 2-4 L/min depending on his degree of activity.  He uses Combivent approximately 2-3 times daily He did pulm rehab and this was beneficial. He is trying to get to the fitness center 2-3x a week. Uses his O2 reliably. He has trouble taking a shower. He can do most things but slowly.   We discussed end-of-life issues today. He has DNR orders at his assisted living. Confirmed w him today.   CT scan of the chest performed 03/04/2021 reviewed by me, shows fairly advanced bullous emphysema, no mediastinal or hilar lymphadenopathy, decrease in size of the posterior right lower lobe nodule, near complete resolution of a 1.3 cm right lower lobe nodule, some stable nodules as well as some new foci including a 7 mm right upper lobe pulmonary nodule, 4 mm right middle lobe nodule.                          Review of Systems  Constitutional:  Negative for fever and unexpected weight change.  HENT:  Negative for congestion, dental problem, ear pain, nosebleeds, postnasal drip, rhinorrhea, sinus pressure, sneezing, sore throat and trouble swallowing.   Eyes:  Negative for redness and itching.  Respiratory:  Positive for shortness of breath.  Negative for cough, chest tightness and wheezing.   Cardiovascular:  Negative for palpitations and leg swelling.  Gastrointestinal:  Negative for nausea and vomiting.  Genitourinary:  Negative for dysuria.  Musculoskeletal:  Negative for joint swelling.  Skin:  Negative for rash.  Neurological:  Negative for  headaches.  Hematological:  Does not bruise/bleed easily.  Psychiatric/Behavioral:  Negative for dysphoric mood. The patient is not nervous/anxious.       Objective:   Physical Exam Vitals:   03/24/21 1343  BP: 138/72  Pulse: 60  Temp: 98.6 F (37 C)  TempSrc: Oral  SpO2: 99%  Weight: 150 lb 12.8 oz (68.4 kg)  Height: 5' 10.5" (1.791 m)    Gen: Pleasant, well-nourished, in no distress,  normal affect  ENT: No lesions,  mouth clear,  oropharynx clear, no postnasal drip  Neck: No JVD, mild UA noise  Lungs: No use of accessory muscles, distant,  no crackles or wheeze today  Cardiovascular: RRR, heart sounds normal, no murmur or gallops, no peripheral edema  Musculoskeletal: No deformities, no cyanosis or clubbing  Neuro: alert, non focal  Skin: Warm, no lesions or rash, multiple ecchymoses      Assessment & Plan:  Pulmonary nodules Reviewed his most recent CT chest.  Waxing and waning pulmonary nodular disease with granulomas on one of his transbronchial biopsies.  HSP panel negative.  Could consider sarcoid.  Also will evaluate for other nodular processes, obtain screening ANCA, RF, ANA.  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  We talked some today about life expectancy, the risk of fatality if he were to develop a flare.  He understands this.  He has DNR orders in place at his assisted living.  I confirmed this today and will place these orders in our system as well.  We will continue Breztri, daily prednisone 5-10 mg, Combivent as needed.    Chronic respiratory failure with hypoxia (HCC) Continue oxygen, 2-4 L/min pulsed.  Time spent 50 minutes  Baltazar Apo, MD,  PhD 03/24/2021, 2:31 PM [facility] [phone] or if no answer [phone]

## 2021-03-24 NOTE — Assessment & Plan Note (Signed)
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  We talked some today about life expectancy, the risk of fatality if he were to develop a flare.  He understands this.  He has DNR orders in place at his assisted living.  I confirmed this today and will place these orders in our system as well.  We will continue Breztri, daily prednisone 5-10 mg, Combivent as needed.

## 2021-03-24 NOTE — Addendum Note (Signed)
Addended by: Gavin Potters R on: 03/24/2021 03:22 PM   Modules accepted: Orders

## 2021-03-24 NOTE — Patient Instructions (Addendum)
We will obtain blood work today. Please continue Breztri 2 puffs twice a day.  Rinse and gargle after using. Keep your Combivent available use 2 puffs when needed for shortness of breath Continue prednisone 5-10 mg once daily depending on how your breathing is doing. We reviewed your CT scan of the chest today.  We will plan to repeat this in 1 year. We will confirm that you are DNR orders and wishes are in place in our office chart. Continue your oxygen 2-4 L/min depending on your level of exertion. Keep your COVID-vaccine and your flu shot up-to-date Follow with Dr Lamonte Sakai in 6 months or sooner if you have any problems

## 2021-03-27 LAB — RHEUMATOID FACTOR: Rheumatoid fact SerPl-aCnc: <14 IU/mL (ref <14)

## 2021-03-27 LAB — ANCA SCREEN W REFLEX TITER: ANCA Screen: NEGATIVE

## 2021-03-27 LAB — ANA: Anti Nuclear Antibody (ANA): NEGATIVE

## 2021-03-28 ENCOUNTER — Other Ambulatory Visit: Payer: Self-pay | Admitting: Cardiology

## 2021-03-29 ENCOUNTER — Encounter: Payer: Self-pay | Admitting: *Deleted

## 2021-04-06 ENCOUNTER — Ambulatory Visit: Payer: Medicare Other | Admitting: Podiatry

## 2021-04-16 DIAGNOSIS — J449 Chronic obstructive pulmonary disease, unspecified: Secondary | ICD-10-CM | POA: Diagnosis not present

## 2021-04-16 DIAGNOSIS — J9611 Chronic respiratory failure with hypoxia: Secondary | ICD-10-CM | POA: Diagnosis not present

## 2021-05-03 DIAGNOSIS — H903 Sensorineural hearing loss, bilateral: Secondary | ICD-10-CM | POA: Diagnosis not present

## 2021-05-16 DIAGNOSIS — J449 Chronic obstructive pulmonary disease, unspecified: Secondary | ICD-10-CM | POA: Diagnosis not present

## 2021-05-16 DIAGNOSIS — J9611 Chronic respiratory failure with hypoxia: Secondary | ICD-10-CM | POA: Diagnosis not present

## 2021-06-15 ENCOUNTER — Other Ambulatory Visit: Payer: Self-pay | Admitting: Cardiology

## 2021-06-16 DIAGNOSIS — J9611 Chronic respiratory failure with hypoxia: Secondary | ICD-10-CM | POA: Diagnosis not present

## 2021-06-16 DIAGNOSIS — J449 Chronic obstructive pulmonary disease, unspecified: Secondary | ICD-10-CM | POA: Diagnosis not present

## 2021-06-19 DIAGNOSIS — E559 Vitamin D deficiency, unspecified: Secondary | ICD-10-CM | POA: Diagnosis not present

## 2021-06-19 DIAGNOSIS — K219 Gastro-esophageal reflux disease without esophagitis: Secondary | ICD-10-CM | POA: Diagnosis not present

## 2021-06-19 DIAGNOSIS — R0902 Hypoxemia: Secondary | ICD-10-CM | POA: Diagnosis not present

## 2021-06-19 DIAGNOSIS — I2510 Atherosclerotic heart disease of native coronary artery without angina pectoris: Secondary | ICD-10-CM | POA: Diagnosis not present

## 2021-06-19 DIAGNOSIS — I739 Peripheral vascular disease, unspecified: Secondary | ICD-10-CM | POA: Diagnosis not present

## 2021-06-19 DIAGNOSIS — J439 Emphysema, unspecified: Secondary | ICD-10-CM | POA: Diagnosis not present

## 2021-06-19 DIAGNOSIS — M2550 Pain in unspecified joint: Secondary | ICD-10-CM | POA: Diagnosis not present

## 2021-06-19 DIAGNOSIS — Z0001 Encounter for general adult medical examination with abnormal findings: Secondary | ICD-10-CM | POA: Diagnosis not present

## 2021-06-19 DIAGNOSIS — J9611 Chronic respiratory failure with hypoxia: Secondary | ICD-10-CM | POA: Diagnosis not present

## 2021-06-19 DIAGNOSIS — I10 Essential (primary) hypertension: Secondary | ICD-10-CM | POA: Diagnosis not present

## 2021-06-19 DIAGNOSIS — Z79899 Other long term (current) drug therapy: Secondary | ICD-10-CM | POA: Diagnosis not present

## 2021-07-07 DIAGNOSIS — B029 Zoster without complications: Secondary | ICD-10-CM | POA: Diagnosis not present

## 2021-07-13 ENCOUNTER — Telehealth: Payer: Self-pay | Admitting: Cardiology

## 2021-07-13 MED ORDER — NITROGLYCERIN 0.4 MG SL SUBL: 0.4000 mg | SUBLINGUAL_TABLET | SUBLINGUAL | 4 refills | Status: DC | PRN

## 2021-07-13 NOTE — Telephone Encounter (Signed)
Refill of Nitroglycerin 0.4 mg sent to [facility].

## 2021-07-13 NOTE — Telephone Encounter (Signed)
Pt states he needs nitro refill [facility] to [facility] [address] in [place] number [phone]   Thank you!

## 2021-07-14 ENCOUNTER — Ambulatory Visit: Payer: Medicare Other | Admitting: Urology

## 2021-07-14 ENCOUNTER — Other Ambulatory Visit: Payer: Self-pay

## 2021-07-14 ENCOUNTER — Encounter: Payer: Self-pay | Admitting: Urology

## 2021-07-14 VITALS — BP 107/64 | HR 89 | Ht 70.0 in | Wt 150.0 lb

## 2021-07-14 DIAGNOSIS — N401 Enlarged prostate with lower urinary tract symptoms: Secondary | ICD-10-CM | POA: Diagnosis not present

## 2021-07-14 DIAGNOSIS — R972 Elevated prostate specific antigen [PSA]: Secondary | ICD-10-CM | POA: Diagnosis not present

## 2021-07-14 MED ORDER — FINASTERIDE 5 MG PO TABS: 5.0000 mg | ORAL_TABLET | Freq: Every day | ORAL | 3 refills | Status: DC

## 2021-07-14 MED ORDER — TAMSULOSIN HCL 0.4 MG PO CAPS: 0.4000 mg | ORAL_CAPSULE | Freq: Every day | ORAL | 3 refills | Status: DC

## 2021-07-14 NOTE — Progress Notes (Signed)
07/14/2021 1:11 PM   [name] [name]. 09/08/1940 [id number]  Referring provider: Josetta Huddle, MD [facility] [employer] [facility] [address],  [place] [address]  Chief Complaint  Patient presents with   Elevated PSA    HPI: 80 y.o. male presents for 6 month follow-up.  Refer to my previous note 01/11/2021 for clinical summary PSA 01/11/2021 was 13.3 He was given samples of Myrbetriq at last visit for urge symptoms.  He vaguely remembers taking them Presently on tamsulosin he has nocturia x2 and gets up 4-5 times per night when not taking the medication Less urge incontinence recently Over the past 2-3 weeks he has had increased urinary hesitancy and straining to urinate   PMH: Past Medical History:  Diagnosis Date   Abdominal discomfort 12/07/2020   Acquired trigger finger of right middle finger 02/04/2020   Acute prostatitis 04/08/2018   Alcohol abuse    Alcohol dependence ([place]) 12/07/2020   Anal or rectal pain 04/08/2018   Annual physical exam 12/07/2020   Anorectal disorder 12/07/2020   Arthritis    Atherosclerotic heart disease of native coronary artery without angina pectoris 04/08/2018   Benign prostatic hyperplasia with lower urinary tract symptoms 12/07/2020   BPH with elevated PSA    Cancer ([place])    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   Chronic obstructive pulmonary disease with (acute) exacerbation ([place]) 12/07/2020   Chronic respiratory failure with hypoxia ([place]) 08/10/2019   Chronic sinusitis 12/07/2020   Cigarette nicotine dependence, uncomplicated 9/37/1696   Coagulation disorder ([place]) 01/13/2019   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   Contracture of palmar fascia 12/07/2020   COPD (chronic obstructive pulmonary disease) ([place])    Corn of toe 12/07/2020   Corns and callosities 04/09/2018   Coronary artery disease    2019 with stents   Coronary atherosclerosis 05/07/2018   Diarrhea 04/09/2018   Double vision with both eyes open  12/07/2020   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   Dysphagia 08/14/2018   Dyspnea    ED (erectile dysfunction)    ED (erectile dysfunction) of organic origin 12/07/2020   Elevated prostate specific antigen (PSA) 04/08/2018   Elevated PSA    Encounter for screening for other disorder 12/07/2020   Enlarged prostate without lower urinary tract symptoms (luts) 04/09/2018   Enthesopathy 12/07/2020   Essential (primary) hypertension 04/08/2018   Ex-smoker 04/09/2018   Exertional dyspnea 04/02/2018   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   Flatulence 04/08/2018   Gastro-esophageal reflux disease without esophagitis 04/08/2018   GERD (gastroesophageal reflux disease)    History of acute otitis externa 08/14/2018   Hyperlipidemia    Hypertension    Hypoxemia 12/07/2020   Kidney cysts    Left knee pain 12/07/2020   Low back pain 04/08/2018   Male erectile dysfunction 04/09/2018   Mixed hyperlipidemia 04/08/2018   Nocturia 04/09/2018   Osteoarthritis of first carpometacarpal joint 04/08/2018   Osteoarthritis of knee 12/07/2020   Other long term (current) drug therapy 12/07/2020   Pain due to onychomycosis of toenail of left foot 07/20/2019   Pain in joint 04/09/2018   Pain in knee 04/09/2018   Pain in unspecified knee 12/07/2020   Pain of finger 04/09/2018   Palpitations    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[place]) 12/07/2020   Personal history of colonic polyps 12/07/2020   Pneumonia    Pneumothorax 04/12/2020   Polypharmacy 04/09/2018   Porokeratosis 01/13/2019   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[place]) 12/07/2020   Pulmonary  nodules 06/08/2016   Pure hypercholesterolemia 12/07/2020   Renal cyst 12/07/2020   Sleep disorder 04/08/2018   Status post bronchoscopy 04/12/2020   Tear of rotator cuff 04/09/2018   Tobacco user 8/84/1660   Uncomplicated alcohol dependence ([place]) 04/09/2018   Unspecified rotator cuff tear or rupture of unspecified shoulder, not specified as traumatic 12/07/2020   Unspecified tear of unspecified  meniscus, current injury, unspecified knee, initial encounter 12/07/2020   Visual disturbance 12/07/2020   Vitamin D deficiency 04/08/2018    Surgical History: Past Surgical History:  Procedure Laterality Date   BRONCHIAL BIOPSY  10/13/2019   Procedure: BRONCHIAL BIOPSIES;  Surgeon: Collene Gobble, MD;  Location: [facility];  Service: Pulmonary;;   BRONCHIAL BIOPSY  04/12/2020   Procedure: BRONCHIAL BIOPSIES;  Surgeon: Collene Gobble, MD;  Location: [facility];  Service: Pulmonary;;   BRONCHIAL BRUSHINGS  10/13/2019   Procedure: BRONCHIAL BRUSHINGS;  Surgeon: Collene Gobble, MD;  Location: [facility] ENDOSCOPY;  Service: Pulmonary;;   BRONCHIAL BRUSHINGS  04/12/2020   Procedure: BRONCHIAL BRUSHINGS;  Surgeon: Collene Gobble, MD;  Location[facility] ENDOSCOPY;  Service: Pulmonary;;   BRONCHIAL NEEDLE ASPIRATION BIOPSY  10/13/2019   Procedure: BRONCHIAL NEEDLE ASPIRATION BIOPSIES;  Surgeon: Collene Gobble, MD;  Location: [facility] ENDOSCOPY;  Service: Pulmonary;;   BRONCHIAL NEEDLE ASPIRATION BIOPSY  04/12/2020   Procedure: BRONCHIAL NEEDLE ASPIRATION BIOPSIES;  Surgeon: Collene Gobble, MD;  Location: [facility] ENDOSCOPY;  Service: Pulmonary;;   BRONCHIAL WASHINGS  10/13/2019   Procedure: BRONCHIAL WASHINGS;  Surgeon: Collene Gobble, MD;  Location: [facility] ENDOSCOPY;  Service: Pulmonary;;   BRONCHIAL WASHINGS  04/12/2020   Procedure: BRONCHIAL WASHINGS;  Surgeon: Collene Gobble, MD;  Location: [facility] ENDOSCOPY;  Service: Pulmonary;;   CARDIAC CATHETERIZATION  2002   50% RCA   CATARACT EXTRACTION, BILATERAL     CORONARY STENT INTERVENTION N/A 04/15/2018   Procedure: CORONARY STENT INTERVENTION;  Surgeon: Jettie Booze, MD;  Location: [facility] CV LAB;  Service: Cardiovascular;  Laterality: N/A;  om1   EYE SURGERY     KNEE ARTHROSCOPY     LEFT HEART CATH AND CORONARY ANGIOGRAPHY N/A 04/15/2018   Procedure: LEFT HEART CATH AND CORONARY ANGIOGRAPHY;  Surgeon: Jettie Booze, MD;  Location: [facility] CV LAB;   Service: Cardiovascular;  Laterality: N/A;   MULTIPLE TOOTH EXTRACTIONS     TONSILLECTOMY     VIDEO BRONCHOSCOPY  04/12/2020   VIDEO BRONCHOSCOPY WITH ENDOBRONCHIAL NAVIGATION N/A 07/26/2016   Procedure: VIDEO BRONCHOSCOPY WITH ENDOBRONCHIAL NAVIGATION;  Surgeon: Collene Gobble, MD;  Location: [facility];  Service: Thoracic;  Laterality: N/A;   VIDEO BRONCHOSCOPY WITH ENDOBRONCHIAL NAVIGATION N/A 10/13/2019   Procedure: [facility] AND VIDEO BRONCHOSCOPY WITH ENDOBRONCHIAL NAVIGATION;  Surgeon: Collene Gobble, MD;  Location: [facility] ENDOSCOPY;  Service: Pulmonary;  Laterality: N/A;   VIDEO BRONCHOSCOPY WITH ENDOBRONCHIAL NAVIGATION N/A 04/12/2020   Procedure: VIDEO BRONCHOSCOPY WITH ENDOBRONCHIAL NAVIGATION;  Surgeon: Collene Gobble, MD;  Location: [facility] ENDOSCOPY;  Service: Pulmonary;  Laterality: N/A;    Home Medications:  Allergies as of 07/14/2021       Reactions   Metronidazole Other (See Comments)   Other reaction(s): skin reaction        Medication List        Accurate as of July 14, 2021  1:11 PM. If you have any questions, ask your nurse or doctor.          albuterol (2.5 MG/3ML) 0.083% nebulizer solution Commonly known as: PROVENTIL Take 3  mLs (2.5 mg total) by nebulization every 6 (six) hours as needed for wheezing or shortness of breath.   albuterol 108 (90 Base) MCG/ACT inhaler Commonly known as: VENTOLIN HFA Inhale 2 puffs into the lungs 3 (three) times daily as needed for wheezing or shortness of breath.   Breztri Aerosphere 160-9-4.8 MCG/ACT Aero Generic drug: Budeson-Glycopyrrol-Formoterol Inhale 2 puffs into the lungs 2 (two) times daily.   Breztri Aerosphere 160-9-4.8 MCG/ACT Aero Generic drug: Budeson-Glycopyrrol-Formoterol Inhale 2 puffs into the lungs in the morning and at bedtime.   cholecalciferol 25 MCG (1000 UNIT) tablet Commonly known as: VITAMIN D3 Take 1,000 Units by mouth 3 (three) times a week.   clonazePAM 0.5 MG tablet Commonly known as:  KLONOPIN Take 0.5 mg by mouth daily as needed.   clopidogrel 75 MG tablet Commonly known as: PLAVIX TAKE 1 TABLET (75 MG TOTAL) BY MOUTH DAILY. OK TO RESTART THIS MEDICATION ON [facility] 04/13/20.   Combivent Respimat 20-100 MCG/ACT Aers respimat Generic drug: Ipratropium-Albuterol Inhale 1-2 puffs into the lungs every 6 (six) hours as needed for wheezing or shortness of breath.   Melatonin 5 MG Caps Take 5 mg by mouth at bedtime.   mirabegron ER 25 MG Tb24 tablet Commonly known as: MYRBETRIQ Take 1 tablet (25 mg total) by mouth daily.   nitroGLYCERIN 0.4 MG SL tablet Commonly known as: NITROSTAT Place 1 tablet (0.4 mg total) under the tongue every 5 (five) minutes as needed for chest pain.   pantoprazole 40 MG tablet Commonly known as: PROTONIX 1 tablet   predniSONE 5 MG tablet Commonly known as: DELTASONE Take 5 mg by mouth daily.   rosuvastatin 20 MG tablet Commonly known as: CRESTOR TAKE 1 TABLET BY MOUTH EVERYDAY AT BEDTIME   tamsulosin 0.4 MG Caps capsule Commonly known as: FLOMAX Take 0.4 mg by mouth daily.   varenicline 1 MG tablet Commonly known as: CHANTIX Take by mouth.        Allergies:  Allergies  Allergen Reactions   Metronidazole Other (See Comments)    Other reaction(s): skin reaction    Family History: Family History  Problem Relation Age of Onset   Hypertension Mother    Alzheimer's disease Mother     Social History:  reports that he quit smoking about a year ago. His smoking use included cigarettes. He started smoking about 69 years ago. He has a 50.25 pack-year smoking history. He has never used smokeless tobacco. He reports that he does not drink alcohol and does not use drugs.   Physical Exam: BP 107/64    Pulse 89    Ht 5\' 10"  (1.778 m)    Wt 150 lb (68 kg)    BMI 21.52 kg/m   Constitutional:  Alert and oriented, No acute distress. HEENT: [place] AT, moist mucus membranes.  Trachea midline, no masses. Cardiovascular: No clubbing,  cyanosis, or edema. Respiratory: Normal respiratory effort, no increased work of breathing. GU: 40-45 g with induration left prostate Psychiatric: Normal mood and affect.   Assessment & Plan:    1.  Elevated PSA Abnormal DRE We discussed findings are suspicious for prostate cancer along with his elevated PSA PSA drawn today and if rising he is agreeable to biopsy  2.  BPH with LUTS Worsening obstructive symptoms Add finasteride 5 mg daily   Abbie Sons, MD  [facility] [address], [place], [place] [address] [phone]

## 2021-07-15 ENCOUNTER — Encounter: Payer: Self-pay | Admitting: Urology

## 2021-07-15 LAB — PSA: Prostate Specific Ag, Serum: 13.7 ng/mL — ABNORMAL HIGH (ref 0.0–4.0)

## 2021-07-16 DIAGNOSIS — J449 Chronic obstructive pulmonary disease, unspecified: Secondary | ICD-10-CM | POA: Diagnosis not present

## 2021-07-16 DIAGNOSIS — J9611 Chronic respiratory failure with hypoxia: Secondary | ICD-10-CM | POA: Diagnosis not present

## 2021-07-18 ENCOUNTER — Telehealth: Payer: Self-pay | Admitting: *Deleted

## 2021-07-18 NOTE — Telephone Encounter (Signed)
Attempted to reach patient. No answer; left message for patient to contact office to schedule a follow up appt for cardiac clearance.

## 2021-07-18 NOTE — Telephone Encounter (Signed)
REQUEST FOR CARDIAC CLEARANCE       Date  07/18/2021  Request Clearance from Dr. Jenean Lindau, MD   Faxed to: [phone]   Procedure: Prostate Biopsy   Date of Procedure: ?  Provider: Dr. Bernardo Heater     Cardiac  Clearance : Yes  Reason: To stop  Plavix 5 to 7 days prior to biopsy      Risk Assessment:    Low   []       Moderate   []     High   []            I recommend further assessment/workup prior to procedure:    YES []      NO  []   Appointment scheduled for: _______________________   Further recommendations: ______________________________    Physician Signature:__________________________________   Printed Name: ________________________________________   Date: _________________

## 2021-07-18 NOTE — Telephone Encounter (Signed)
Primary Cardiologist:Rajan R Revankar, MD  Chart reviewed as part of pre-operative protocol coverage. Because of [name] KAUTH Jr.'s past medical history and time since last visit, he/she will require a follow-up visit in order to better assess preoperative cardiovascular risk.  Pre-op covering staff: - Please schedule appointment and call patient to inform them. - Please contact requesting surgeon's office via preferred method (i.e, phone, fax) to inform them of need for appointment prior to surgery.  If applicable, this message will also be routed to pharmacy pool and/or primary cardiologist for input on holding anticoagulant/antiplatelet agent as requested below so that this information is available at time of patient's appointment.   Lenna Sciara, NP  07/18/2021, 2:06 PM

## 2021-07-19 NOTE — Telephone Encounter (Signed)
Received a message from the scheduler who states the patient called back and scheduled an appt on 08/03/21 at 1:20 pm with Dr Geraldo Pitter.

## 2021-07-19 NOTE — Telephone Encounter (Addendum)
Left messages for patient to contact the office with all number in the patients chart.

## 2021-07-26 ENCOUNTER — Ambulatory Visit: Payer: Medicare Other | Admitting: Cardiology

## 2021-07-26 DIAGNOSIS — M47816 Spondylosis without myelopathy or radiculopathy, lumbar region: Secondary | ICD-10-CM | POA: Diagnosis not present

## 2021-07-26 DIAGNOSIS — M545 Low back pain, unspecified: Secondary | ICD-10-CM | POA: Diagnosis not present

## 2021-07-26 DIAGNOSIS — M4316 Spondylolisthesis, lumbar region: Secondary | ICD-10-CM | POA: Diagnosis not present

## 2021-07-26 DIAGNOSIS — R29890 Loss of height: Secondary | ICD-10-CM | POA: Diagnosis not present

## 2021-07-26 DIAGNOSIS — R911 Solitary pulmonary nodule: Secondary | ICD-10-CM | POA: Diagnosis not present

## 2021-07-26 DIAGNOSIS — M542 Cervicalgia: Secondary | ICD-10-CM | POA: Diagnosis not present

## 2021-07-26 DIAGNOSIS — M8588 Other specified disorders of bone density and structure, other site: Secondary | ICD-10-CM | POA: Diagnosis not present

## 2021-07-31 ENCOUNTER — Encounter: Payer: Self-pay | Admitting: Emergency Medicine

## 2021-08-01 ENCOUNTER — Other Ambulatory Visit: Payer: Self-pay

## 2021-08-02 ENCOUNTER — Encounter: Payer: Self-pay | Admitting: Emergency Medicine

## 2021-08-02 MED ORDER — PREDNISONE 10 MG PO TABS: ORAL_TABLET | ORAL | 0 refills | Status: DC

## 2021-08-02 MED ORDER — AZITHROMYCIN 250 MG PO TABS: ORAL_TABLET | ORAL | 0 refills | Status: DC

## 2021-08-02 NOTE — Telephone Encounter (Signed)
Are you able to call this patient? He was offered a Mychart visit and declined. Please advise.

## 2021-08-02 NOTE — Telephone Encounter (Signed)
Pt has been experiencing URI sx for the last 7-10 days. Has evolved SOB, more cough now productive > clear to gray.  Remains on breztri  Pt also mentioned that I will need to discuss his status with Dr John Giovanni w Urology > possible prostate CA, procedure, prognosis regarding his lung disease. I will communicate w Dr Bernardo Heater  Needs to be treated for an AE-COPD: Pred > Take 40mg  daily for 3 days, then 30mg  daily for 3 days, then 20mg  daily for 3 days, then 10mg  daily for 3 days, then stop Azithro > Z pack  [facility]

## 2021-08-03 ENCOUNTER — Encounter: Payer: Self-pay | Admitting: Cardiology

## 2021-08-03 ENCOUNTER — Ambulatory Visit: Payer: Medicare Other | Admitting: Cardiology

## 2021-08-03 ENCOUNTER — Other Ambulatory Visit: Payer: Self-pay

## 2021-08-03 VITALS — BP 104/60 | HR 91 | Ht 70.6 in | Wt 152.2 lb

## 2021-08-03 DIAGNOSIS — I2510 Atherosclerotic heart disease of native coronary artery without angina pectoris: Secondary | ICD-10-CM

## 2021-08-03 DIAGNOSIS — J431 Panlobular emphysema: Secondary | ICD-10-CM

## 2021-08-03 DIAGNOSIS — E782 Mixed hyperlipidemia: Secondary | ICD-10-CM | POA: Diagnosis not present

## 2021-08-03 DIAGNOSIS — I10 Essential (primary) hypertension: Secondary | ICD-10-CM

## 2021-08-03 MED ORDER — ASPIRIN EC 81 MG PO TBEC: 81.0000 mg | DELAYED_RELEASE_TABLET | Freq: Every day | ORAL | 3 refills | Status: DC

## 2021-08-03 NOTE — Patient Instructions (Addendum)
Medication Instructions:  Your physician has recommended you make the following change in your medication: STOP PLAVIX START ASPIRIN 81 MG ONCE DAILY  *If you need a refill on your cardiac medications before your next appointment, please call your pharmacy*   Lab Work: NONE If you have labs (blood work) drawn today and your tests are completely normal, you will receive your results only by: [place] (if you have MyChart) OR A paper copy in the mail If you have any lab test that is abnormal or we need to change your treatment, we will call you to review the results.   Testing/Procedures: EKG   Follow-Up: At [facility], you and your health needs are our priority.  As part of our continuing mission to provide you with exceptional heart care, we have created designated Provider Care Teams.  These Care Teams include your primary Cardiologist (physician) and Advanced Practice Providers (APPs -  Physician Assistants and Nurse Practitioners) who all work together to provide you with the care you need, when you need it.  We recommend signing up for the patient portal called "MyChart".  Sign up information is provided on this After Visit Summary.  MyChart is used to connect with patients for Virtual Visits (Telemedicine).  Patients are able to view lab/test results, encounter notes, upcoming appointments, etc.  Non-urgent messages can be sent to your provider as well.   To learn more about what you can do with MyChart, go to [email or web address].    Your next appointment:   2 month(s)  The format for your next appointment:   In Person  Provider:   Jyl [facility], MD    Other Instructions

## 2021-08-03 NOTE — Progress Notes (Signed)
Cardiology Office Note:    Date:  08/03/2021   ID:  [name] Kemps., DOB [birth year], MRN [id number]  PCP:  Josetta Huddle, MD  Cardiologist:  Jenean Lindau, MD   Referring MD: Josetta Huddle, MD    ASSESSMENT:    1. Coronary artery disease involving native coronary artery of native heart without angina pectoris   2. Mixed hyperlipidemia   3. Essential (primary) hypertension   4. Panlobular emphysema (HCC)    PLAN:    In order of problems listed above:  Coronary artery disease: Secondary prevention stressed with the patient.  Importance of compliance with diet medication stressed and vocalized understanding.  He has excellent effort tolerance and I am happy with this exercise protocol. Preop assessment: Patient is undergoing prostate biopsy.  Based on his effort tolerance he does not seem to be at high risk for the biopsy.  If he needs any further procedures such as surgery will have to evaluate him again.  Also he is on Plavix on a daily basis.  I told him to stop Plavix and start aspirin coated 81 mg daily and he understands.  I will review this when he sees me in follow-up appointment in a month. Essential hypertension: Blood pressure stable and diet was emphasized. Mixed dyslipidemia: On statin therapy and doing well. COPD: On oxygen supplementation.  This and its perioperative ramifications will be managed by his primary care. Patient will be seen in follow-up appointment in 2 months or earlier if the patient has any concerns    Medication Adjustments/Labs and Tests Ordered: Current medicines are reviewed at length with the patient today.  Concerns regarding medicines are outlined above.  Orders Placed This Encounter  Procedures   EKG 12-Lead   Meds ordered this encounter  Medications   aspirin EC 81 MG tablet    Sig: Take 1 tablet (81 mg total) by mouth daily. Swallow whole.    Dispense:  90 tablet    Refill:  3     No chief complaint on file.    History of  Present Illness:    [name] [name]. is a 81 y.o. male.  Patient has past medical history of coronary artery disease, essential hypertension and dyslipidemia.  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  He is here for preop assessment for prostate biopsy.  He mentions to me that he uses an exercise bicycle for 20 minutes without any problems and does other exercises without any symptoms 2.  No chest pain orthopnea or PND.  At the time of my evaluation, the patient is alert awake oriented and in no distress.  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   Chronic obstructive pulmonary disease with (acute) exacerbation ([place]) 12/07/2020   Chronic respiratory failure with hypoxia ([place]) 08/10/2019   Chronic sinusitis 12/07/2020   Cigarette nicotine dependence, uncomplicated 1/76/1607   Coagulation disorder ([place]) 01/13/2019   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   Contracture of palmar fascia 12/07/2020   COPD (chronic obstructive pulmonary disease) (HCC)    Corn of toe 12/07/2020   Corns and callosities 04/09/2018   Coronary artery disease    2019 with stents   Coronary atherosclerosis 05/07/2018   Diarrhea 04/09/2018   Double vision with both eyes open 12/07/2020   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   Dysphagia 08/14/2018   Dyspnea    ED (erectile dysfunction)    ED (erectile dysfunction) of organic origin 12/07/2020   Elevated prostate  specific antigen (PSA) 04/08/2018   Elevated PSA    Encounter for screening for other disorder 12/07/2020   Enlarged prostate without lower urinary tract symptoms (luts) 04/09/2018   Enthesopathy 12/07/2020   Essential (primary) hypertension 04/08/2018   Ex-smoker 04/09/2018   Exertional dyspnea 04/02/2018   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   Flatulence 04/08/2018   Gastro-esophageal reflux disease without esophagitis 04/08/2018   GERD (gastroesophageal reflux disease)    History of acute otitis externa 08/14/2018   Hyperlipidemia    Hypertension    Hypoxemia 12/07/2020   Kidney cysts    Left knee pain 12/07/2020   Low back pain 04/08/2018   Male erectile dysfunction 04/09/2018   Mixed hyperlipidemia 04/08/2018   Nocturia 04/09/2018   Nocturia more than twice per night 04/09/2018   Osteoarthritis of first carpometacarpal joint 04/08/2018   Osteoarthritis of knee 12/07/2020   Other long term (current) drug therapy 12/07/2020   Pain due to onychomycosis of toenail of left foot 07/20/2019   Pain in joint 04/09/2018   Pain in knee 04/09/2018   Pain in unspecified knee 12/07/2020   Pain of finger 04/09/2018   Palpitations    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[place]) 12/07/2020   Personal history of colonic polyps 12/07/2020   Pneumonia    Pneumothorax 04/12/2020   Polypharmacy 04/09/2018   Porokeratosis 01/13/2019   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[place]) 12/07/2020   Pulmonary nodules 06/08/2016   Pure hypercholesterolemia 12/07/2020   Renal cyst 12/07/2020   Sleep disorder 04/08/2018   Status post bronchoscopy 04/12/2020   Swelling of ankle joint, left 03/16/2021   Tear of rotator cuff 04/09/2018   Tobacco user 11/21/9561   Uncomplicated alcohol dependence ([place]) 04/09/2018   Unspecified rotator cuff tear or rupture of unspecified shoulder, not specified as traumatic 12/07/2020   Unspecified tear of unspecified meniscus, current injury, unspecified knee, initial encounter 12/07/2020   Visual disturbance 12/07/2020    Vitamin D deficiency 04/08/2018    Past Surgical History:  Procedure Laterality Date   BRONCHIAL BIOPSY  10/13/2019   Procedure: BRONCHIAL BIOPSIES;  Surgeon: Collene Gobble, MD;  Location: [place];  Service: Pulmonary;;   BRONCHIAL BIOPSY  04/12/2020   Procedure: BRONCHIAL BIOPSIES;  Surgeon: Collene Gobble, MD;  Location: [employer];  Service: Pulmonary;;   BRONCHIAL BRUSHINGS  10/13/2019   Procedure: BRONCHIAL BRUSHINGS;  Surgeon: Collene Gobble, MD;  Location: [facility] ENDOSCOPY;  Service: Pulmonary;;   BRONCHIAL BRUSHINGS  04/12/2020   Procedure: BRONCHIAL BRUSHINGS;  Surgeon: Collene Gobble, MD;  Location[facility] ENDOSCOPY;  Service: Pulmonary;;   BRONCHIAL NEEDLE ASPIRATION BIOPSY  10/13/2019   Procedure: BRONCHIAL NEEDLE ASPIRATION BIOPSIES;  Surgeon: Collene Gobble, MD;  Location: [facility] ENDOSCOPY;  Service: Pulmonary;;   BRONCHIAL NEEDLE ASPIRATION BIOPSY  04/12/2020   Procedure: BRONCHIAL NEEDLE ASPIRATION BIOPSIES;  Surgeon: Collene Gobble, MD;  Location: [facility] ENDOSCOPY;  Service: Pulmonary;;   BRONCHIAL WASHINGS  10/13/2019   Procedure: BRONCHIAL WASHINGS;  Surgeon: Collene Gobble, MD;  Location: [facility] ENDOSCOPY;  Service: Pulmonary;;   BRONCHIAL WASHINGS  04/12/2020   Procedure: BRONCHIAL WASHINGS;  Surgeon: Collene Gobble, MD;  Location: [facility] ENDOSCOPY;  Service: Pulmonary;;   CARDIAC CATHETERIZATION  2002   50% RCA   CATARACT EXTRACTION, BILATERAL     CORONARY STENT INTERVENTION N/A 04/15/2018   Procedure: CORONARY STENT INTERVENTION;  Surgeon: Jettie Booze, MD;  Location: [place] CV LAB;  Service: Cardiovascular;  Laterality: N/A;  om1   EYE SURGERY     KNEE ARTHROSCOPY     LEFT HEART CATH AND CORONARY ANGIOGRAPHY N/A 04/15/2018   Procedure: LEFT HEART CATH AND CORONARY ANGIOGRAPHY;  Surgeon: Jettie Booze, MD;  Location: [place] CV LAB;  Service: Cardiovascular;  Laterality: N/A;   MULTIPLE TOOTH EXTRACTIONS     TONSILLECTOMY     VIDEO BRONCHOSCOPY  04/12/2020    VIDEO BRONCHOSCOPY WITH ENDOBRONCHIAL NAVIGATION N/A 07/26/2016   Procedure: VIDEO BRONCHOSCOPY WITH ENDOBRONCHIAL NAVIGATION;  Surgeon: Collene Gobble, MD;  Location: [place];  Service: Thoracic;  Laterality: N/A;   VIDEO BRONCHOSCOPY WITH ENDOBRONCHIAL NAVIGATION N/A 10/13/2019   Procedure: [facility] AND VIDEO BRONCHOSCOPY WITH ENDOBRONCHIAL NAVIGATION;  Surgeon: Collene Gobble, MD;  Location: [place] ENDOSCOPY;  Service: Pulmonary;  Laterality: N/A;   VIDEO BRONCHOSCOPY WITH ENDOBRONCHIAL NAVIGATION N/A 04/12/2020   Procedure: VIDEO BRONCHOSCOPY WITH ENDOBRONCHIAL NAVIGATION;  Surgeon: Collene Gobble, MD;  Location: [place] ENDOSCOPY;  Service: Pulmonary;  Laterality: N/A;    Current Medications: Current Meds  Medication Sig   albuterol (PROVENTIL) (2.5 MG/3ML) 0.083% nebulizer solution Take 3 mLs (2.5 mg total) by nebulization every 6 (six) hours as needed for wheezing or shortness of breath.   albuterol (VENTOLIN HFA) 108 (90 Base) MCG/ACT inhaler Inhale 2 puffs into the lungs 3 (three) times daily as needed for wheezing or shortness of breath.   aspirin EC 81 MG tablet Take 1 tablet (81 mg total) by mouth daily. Swallow whole.   azithromycin (ZITHROMAX Z-PAK) 250 MG tablet Take as directed   Budeson-Glycopyrrol-Formoterol (BREZTRI AEROSPHERE) 160-9-4.8 MCG/ACT AERO Inhale 2 puffs into the lungs 2 (two) times daily.   cholecalciferol (VITAMIN D3) 25 MCG (1000 UNIT) tablet Take 1,000 Units by mouth 3 (three) times a week.    clonazePAM (KLONOPIN) 0.5 MG tablet Take 0.5 mg by mouth daily as needed for anxiety.   clopidogrel (PLAVIX) 75 MG tablet TAKE 1 TABLET (75 MG TOTAL) BY MOUTH DAILY. OK TO RESTART THIS MEDICATION ON [facility] 04/13/20.   [employer] MUCUS EXTENDED RELEASE 600 MG 12 hr tablet Take 600 mg by mouth every 12 (twelve) hours as needed for congestion.   finasteride (PROSCAR) 5 MG tablet Take 1 tablet (5 mg total) by mouth daily.   gabapentin (NEURONTIN) 100 MG capsule Take 100 mg by mouth 2 (two)  times daily.   Ipratropium-Albuterol (COMBIVENT RESPIMAT) 20-100 MCG/ACT AERS respimat Inhale 1-2 puffs into the lungs every 6 (six) hours as needed for wheezing or shortness of breath.   Melatonin 5 MG CAPS Take 5 mg by mouth at bedtime.   mirabegron ER (MYRBETRIQ) 25 MG TB24 tablet Take 1 tablet (25 mg total) by mouth daily.   nitroGLYCERIN (NITROSTAT) 0.4 MG SL tablet Place 1 tablet (0.4 mg total) under the tongue every 5 (five) minutes as needed for chest pain.   pantoprazole (PROTONIX) 40 MG tablet Take 40 mg by mouth daily.   predniSONE (DELTASONE) 5 MG tablet Take 5 mg by mouth daily.  rosuvastatin (CRESTOR) 20 MG tablet TAKE 1 TABLET BY MOUTH EVERYDAY AT BEDTIME   sildenafil (VIAGRA) 100 MG tablet Take 100 mg by mouth as needed for erectile dysfunction.   tamsulosin (FLOMAX) 0.4 MG CAPS capsule Take 1 capsule (0.4 mg total) by mouth daily.   valsartan (DIOVAN) 80 MG tablet Take 80 mg by mouth daily.   varenicline (CHANTIX) 1 MG tablet Take 1 mg by mouth daily.     Allergies:   Metronidazole   Social History   Socioeconomic History   Marital status: Married    Spouse name: Not on file   Number of children: Not on file   Years of education: Not on file   Highest education level: Not on file  Occupational History   Not on file  Tobacco Use   Smoking status: Former    Packs/day: 0.75    Years: 67.00    Pack years: 50.25    Types: Cigarettes    Start date: 65    Quit date: 07/13/2020    Years since quitting: 1.0   Smokeless tobacco: Never   Tobacco comments:    Recent Quit    Vaping Use   Vaping Use: Former  Substance and Sexual Activity   Alcohol use: No    Alcohol/week: 0.0 standard drinks    Comment: quit drinking 2014   Drug use: No   Sexual activity: Not on file  Other Topics Concern   Not on file  Social History Narrative   Not on file   Social Determinants of Health   Financial Resource Strain: Not on file  Food Insecurity: Not on file   Transportation Needs: Not on file  Physical Activity: Not on file  Stress: Not on file  Social Connections: Not on file     Family History: The patient's family history includes Alzheimer's disease in his mother; Hypertension in his mother.  ROS:   Please see the history of present illness.    All other systems reviewed and are negative.  EKGs/Labs/Other Studies Reviewed:    The following studies were reviewed today: EKG reveals sinus tach and nonspecific ST changes   Recent Labs: No results found for requested labs within last 8760 hours.  Recent Lipid Panel    Component Value Date/Time   CHOL 122 06/06/2018 1012   TRIG 54 06/06/2018 1012   HDL 61 06/06/2018 1012   CHOLHDL 2.0 06/06/2018 1012   LDLCALC 50 06/06/2018 1012    Physical Exam:    VS:  BP 104/60    Pulse 91    Ht 5' 10.6" (1.793 m)    Wt 152 lb 3.2 oz (69 kg)    SpO2 96%    BMI 21.47 kg/m     Wt Readings from Last 3 Encounters:  08/03/21 152 lb 3.2 oz (69 kg)  07/14/21 150 lb (68 kg)  03/24/21 150 lb 12.8 oz (68.4 kg)     GEN: Patient is in no acute distress HEENT: Normal NECK: No JVD; No carotid bruits LYMPHATICS: No lymphadenopathy CARDIAC: Hear sounds regular, 2/6 systolic murmur at the apex. RESPIRATORY:  Clear to auscultation without rales, wheezing or rhonchi  ABDOMEN: Soft, non-tender, non-distended MUSCULOSKELETAL:  No edema; No deformity  SKIN: Warm and dry NEUROLOGIC:  Alert and oriented x 3 PSYCHIATRIC:  Normal affect   Signed, Jenean Lindau, MD  08/03/2021 1:53 PM    [facility]

## 2021-08-04 NOTE — Telephone Encounter (Signed)
Read Dr. Geraldo Pitter office note . Patient may have this prostate biopsy done. I called patient and left a message for him to call the office back and schedule his prostate biopsy

## 2021-08-07 NOTE — Telephone Encounter (Signed)
Talked with patient about the risk of aspirin . Advised patient that we may have oxygen here at the office for use if needed. Went over the instruction with him .

## 2021-08-11 ENCOUNTER — Other Ambulatory Visit: Payer: Self-pay

## 2021-08-11 ENCOUNTER — Other Ambulatory Visit: Payer: Self-pay | Admitting: Urology

## 2021-08-11 ENCOUNTER — Encounter: Payer: Self-pay | Admitting: Urology

## 2021-08-11 ENCOUNTER — Ambulatory Visit: Payer: Medicare Other | Admitting: Urology

## 2021-08-11 ENCOUNTER — Encounter: Payer: Self-pay | Admitting: Emergency Medicine

## 2021-08-11 VITALS — BP 128/74 | HR 83 | Ht 70.0 in | Wt 150.0 lb

## 2021-08-11 DIAGNOSIS — C61 Malignant neoplasm of prostate: Secondary | ICD-10-CM

## 2021-08-11 DIAGNOSIS — R972 Elevated prostate specific antigen [PSA]: Secondary | ICD-10-CM

## 2021-08-11 MED ORDER — GENTAMICIN SULFATE 40 MG/ML IJ SOLN
80.0000 mg | Freq: Once | INTRAMUSCULAR | Status: AC
  Administered 2021-08-11: 80 mg via INTRAMUSCULAR

## 2021-08-11 MED ORDER — LEVOFLOXACIN 500 MG PO TABS
500.0000 mg | ORAL_TABLET | Freq: Once | ORAL | Status: AC
  Administered 2021-08-11: 500 mg via ORAL

## 2021-08-11 NOTE — Telephone Encounter (Signed)
Received the following message from patient:   "The medication that you prescribed for this respiratory infection has worked wonders. For the past three days I have been breathing freely, have had limited use of my concentrator and feel really good. Tells me that I need to be more proactive when I begin to have difficulties rather than just thinking that it's normal."  Will route to RB as a FYI.

## 2021-08-11 NOTE — Progress Notes (Signed)
Prostate Biopsy Procedure   Informed consent was obtained after discussing risks/benefits of the procedure.  A time out was performed to ensure correct patient identity.  Pre-Procedure: - Last PSA Level: 07/14/2021-13.7; abnormally firm left prostate - Gentamicin given prophylactically - Levaquin 500 mg administered PO - Plavix discontinued -Transrectal Ultrasound performed revealing a 35 gm prostate - Left PZ hypoechoic; normal seminal vesicles  Procedure: - Prostate block performed using 10 cc 1% lidocaine and biopsies taken from sextant areas, a total of 6 under ultrasound guidance +1 digital biopsy left prostate that was placed in the left apex container.  Post-Procedure: - Patient tolerated the procedure well - He was counseled to seek immediate medical attention if experiences any severe pain, significant bleeding, or fevers - May resume Plavix tomorrow - Return in one week to discuss biopsy results   John Giovanni, MD

## 2021-08-14 LAB — SURGICAL PATHOLOGY

## 2021-08-16 DIAGNOSIS — J9611 Chronic respiratory failure with hypoxia: Secondary | ICD-10-CM | POA: Diagnosis not present

## 2021-08-16 DIAGNOSIS — J449 Chronic obstructive pulmonary disease, unspecified: Secondary | ICD-10-CM | POA: Diagnosis not present

## 2021-08-17 ENCOUNTER — Telehealth: Payer: Self-pay | Admitting: Emergency Medicine

## 2021-08-17 MED ORDER — AZITHROMYCIN 250 MG PO TABS: 250.0000 mg | ORAL_TABLET | ORAL | 0 refills | Status: DC

## 2021-08-17 MED ORDER — PREDNISONE 10 MG PO TABS: ORAL_TABLET | ORAL | 0 refills | Status: DC

## 2021-08-17 NOTE — Telephone Encounter (Signed)
I spoke with the pt and notified of response per MR. He verbalized understanding. Rxs sent to pharm. He will call if not improving.

## 2021-08-17 NOTE — Telephone Encounter (Signed)
[place] for another [employer] and Please take prednisone 40 mg x1 day, then 30 mg x1 day, then 20 mg x1 day, then 10 mg x1 day, and then 5 mg x1 day and stop

## 2021-08-17 NOTE — Telephone Encounter (Signed)
Called and spoke with patient. He stated that RB had sent in a zpak and prednisone back on 07/31/21 due to him having a COPD exacerbation. He has finished both medications. He felt great for about a week but the wheezing and SOB has returned. Non-productive cough. He denied any fevers or body aches. Also denied being around anyone who has been sick recently. States he is still using the Lamoni daily and Combivent as needed.   He wanted to know if RB would be willing to send in another zpak and prednisone taper for him. He is aware that RB is not here today and this message would be sent to the DOD. I offered a MyChart video visit with an APP but he refused.   [facility] [place] on [address].   MR, can you please advise since RB is not available today? Thanks!

## 2021-08-18 ENCOUNTER — Encounter: Payer: Self-pay | Admitting: Urology

## 2021-08-18 ENCOUNTER — Other Ambulatory Visit: Payer: Self-pay

## 2021-08-18 ENCOUNTER — Ambulatory Visit (INDEPENDENT_AMBULATORY_CARE_PROVIDER_SITE_OTHER): Payer: Medicare Other | Admitting: Urology

## 2021-08-18 VITALS — BP 120/71 | HR 86 | Ht 70.0 in | Wt 150.0 lb

## 2021-08-18 DIAGNOSIS — C61 Malignant neoplasm of prostate: Secondary | ICD-10-CM | POA: Diagnosis not present

## 2021-08-18 NOTE — Progress Notes (Signed)
08/18/2021 12:58 PM   [name] [name]. [birth year] [id number]  Referring provider: Josetta Huddle, MD [place]. [employer] [address],  [place] [address]  Chief Complaint  Patient presents with   Results    HPI: 81 y.o. male presents for prostate biopsy follow-up.  Biopsy 08/11/2021 PSA 13.7 and firm left prostate No postbiopsy complaints Pathology: Only sextant biopsies were performed.  3/3 left sided cores positive (Gleason 3+4 adenocarcinoma mid and apex; 3+3 adenocarcinoma left base.  Tissue involvement greatest mid core at 89%   PMH: Past Medical History:  Diagnosis Date   Abdominal discomfort 12/07/2020   Acquired trigger finger of right middle finger 02/04/2020   Acute prostatitis 04/08/2018   Alcohol abuse    Alcohol dependence ([place]) 12/07/2020   Anal or rectal pain 04/08/2018   Annual physical exam 12/07/2020   Anorectal disorder 12/07/2020   Arthritis    Atherosclerotic heart disease of native coronary artery without angina pectoris 04/08/2018   Benign prostatic hyperplasia with lower urinary tract symptoms 12/07/2020   BPH with elevated PSA    Cancer ([place])    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   Chronic obstructive pulmonary disease with (acute) exacerbation ([place]) 12/07/2020   Chronic respiratory failure with hypoxia ([place]) 08/10/2019   Chronic sinusitis 12/07/2020   Cigarette nicotine dependence, uncomplicated 3/00/9233   Coagulation disorder ([place]) 01/13/2019   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   Contracture of palmar fascia 12/07/2020   COPD (chronic obstructive pulmonary disease) ([place])    Corn of toe 12/07/2020   Corns and callosities 04/09/2018   Coronary artery disease    2019 with stents   Coronary atherosclerosis 05/07/2018   Diarrhea 04/09/2018   Double vision with both eyes open 12/07/2020   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   Dysphagia 08/14/2018   Dyspnea    ED (erectile dysfunction)    ED (erectile  dysfunction) of organic origin 12/07/2020   Elevated prostate specific antigen (PSA) 04/08/2018   Elevated PSA    Encounter for screening for other disorder 12/07/2020   Enlarged prostate without lower urinary tract symptoms (luts) 04/09/2018   Enthesopathy 12/07/2020   Essential (primary) hypertension 04/08/2018   Ex-smoker 04/09/2018   Exertional dyspnea 04/02/2018   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   Flatulence 04/08/2018   Gastro-esophageal reflux disease without esophagitis 04/08/2018   GERD (gastroesophageal reflux disease)    History of acute otitis externa 08/14/2018   Hyperlipidemia    Hypertension    Hypoxemia 12/07/2020   Kidney cysts    Left knee pain 12/07/2020   Low back pain 04/08/2018   Male erectile dysfunction 04/09/2018   Mixed hyperlipidemia 04/08/2018   Nocturia 04/09/2018   Nocturia more than twice per night 04/09/2018   Osteoarthritis of first carpometacarpal joint 04/08/2018   Osteoarthritis of knee 12/07/2020   Other long term (current) drug therapy 12/07/2020   Pain due to onychomycosis of toenail of left foot 07/20/2019   Pain in joint 04/09/2018   Pain in knee 04/09/2018   Pain in unspecified knee 12/07/2020   Pain of finger 04/09/2018   Palpitations    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[place]) 12/07/2020   Personal history of colonic polyps 12/07/2020   Pneumonia    Pneumothorax 04/12/2020   Polypharmacy 04/09/2018   Porokeratosis 01/13/2019   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   Pulmonary nodules 06/08/2016   Pure hypercholesterolemia 12/07/2020   Renal cyst 12/07/2020   Sleep disorder 04/08/2018   Status  post bronchoscopy 04/12/2020   Swelling of ankle joint, left 03/16/2021   Tear of rotator cuff 04/09/2018   Tobacco user 12/25/4130   Uncomplicated alcohol dependence ([place]) 04/09/2018   Unspecified rotator cuff tear or rupture of unspecified shoulder, not specified as traumatic 12/07/2020   Unspecified tear of unspecified meniscus, current injury, unspecified knee,  initial encounter 12/07/2020   Visual disturbance 12/07/2020   Vitamin D deficiency 04/08/2018    Surgical History: Past Surgical History:  Procedure Laterality Date   BRONCHIAL BIOPSY  10/13/2019   Procedure: BRONCHIAL BIOPSIES;  Surgeon: Collene Gobble, MD;  Location: [facility];  Service: Pulmonary;;   BRONCHIAL BIOPSY  04/12/2020   Procedure: BRONCHIAL BIOPSIES;  Surgeon: Collene Gobble, MD;  Location: [facility];  Service: Pulmonary;;   BRONCHIAL BRUSHINGS  10/13/2019   Procedure: BRONCHIAL BRUSHINGS;  Surgeon: Collene Gobble, MD;  Location: [facility];  Service: Pulmonary;;   BRONCHIAL BRUSHINGS  04/12/2020   Procedure: BRONCHIAL BRUSHINGS;  Surgeon: Collene Gobble, MD;  Location: [facility];  Service: Pulmonary;;   BRONCHIAL NEEDLE ASPIRATION BIOPSY  10/13/2019   Procedure: BRONCHIAL NEEDLE ASPIRATION BIOPSIES;  Surgeon: Collene Gobble, MD;  Location: [facility]OS[facility];  Service: Pulmonary;;   BRONCHIAL NEEDLE ASPIRATION BIOPSY  04/12/2020   Procedure: BRONCHIAL NEEDLE ASPIRATION BIOPSIES;  Surgeon: Collene Gobble, MD;  Location: [place];  Service: Pulmonary;;   BRONCHIAL WASHINGS  10/13/2019   Procedure: BRONCHIAL WASHINGS;  Surgeon: Collene Gobble, MD;  Location: [facility];  Service: Pulmonary;;   BRONCHIAL WASHINGS  04/12/2020   Procedure: BRONCHIAL WASHINGS;  Surgeon: Collene Gobble, MD;  Location: [facility];  Service: Pulmonary;;   CARDIAC CATHETERIZATION  2002   50% RCA   CATARACT EXTRACTION, BILATERAL     CORONARY STENT INTERVENTION N/A 04/15/2018   Procedure: CORONARY STENT INTERVENTION;  Surgeon: Jettie Booze, MD;  Location: [facility];  Service: Cardiovascular;  Laterality: N/A;  om1   EYE SURGERY     KNEE ARTHROSCOPY     LEFT HEART CATH AND CORONARY ANGIOGRAPHY N/A 04/15/2018   Procedure: LEFT HEART CATH AND CORONARY ANGIOGRAPHY;  Surgeon: Jettie Booze, MD;  Location: [facility];  Service: Cardiovascular;  Laterality: N/A;    MULTIPLE TOOTH EXTRACTIONS     TONSILLECTOMY     VIDEO BRONCHOSCOPY  04/12/2020   VIDEO BRONCHOSCOPY WITH ENDOBRONCHIAL NAVIGATION N/A 07/26/2016   Procedure: VIDEO BRONCHOSCOPY WITH ENDOBRONCHIAL NAVIGATION;  Surgeon: Collene Gobble, MD;  Location: [facility];  Service: Thoracic;  Laterality: N/A;   VIDEO BRONCHOSCOPY WITH ENDOBRONCHIAL NAVIGATION N/A 10/13/2019   Procedure: [facility] AND VIDEO BRONCHOSCOPY WITH ENDOBRONCHIAL NAVIGATION;  Surgeon: Collene Gobble, MD;  Location: [facility]COPY;  Service: Pulmonary;  Laterality: N/A;   VIDEO BRONCHOSCOPY WITH ENDOBRONCHIAL NAVIGATION N/A 04/12/2020   Procedure: VIDEO BRONCHOSCOPY WITH ENDOBRONCHIAL NAVIGATION;  Surgeon: Collene Gobble, MD;  Location: [facility];  Service: Pulmonary;  Laterality: N/A;    Home Medications:  Allergies as of 08/18/2021       Reactions   Metronidazole Other (See Comments)   Other reaction(s): skin reaction        Medication List        Accurate as of August 18, 2021 12:58 PM. If you have any questions, ask your nurse or doctor.          albuterol (2.5 MG/3ML) 0.083% nebulizer solution Commonly known as: PROVENTIL Take 3 mLs (2.5 mg total) by nebulization every 6 (six) hours as needed for  wheezing or shortness of breath.   albuterol 108 (90 Base) MCG/ACT inhaler Commonly known as: VENTOLIN HFA Inhale 2 puffs into the lungs 3 (three) times daily as needed for wheezing or shortness of breath.   aspirin EC 81 MG tablet Take 1 tablet (81 mg total) by mouth daily. Swallow whole.   azithromycin 250 MG tablet Commonly known as: Zithromax Z-Pak Take as directed   azithromycin 250 MG tablet Commonly known as: ZITHROMAX Take 1 tablet (250 mg total) by mouth as directed.   Breztri Aerosphere 160-9-4.8 MCG/ACT Aero Generic drug: Budeson-Glycopyrrol-Formoterol Inhale 2 puffs into the lungs 2 (two) times daily.   cholecalciferol 25 MCG (1000 UNIT) tablet Commonly known as: VITAMIN D3 Take 1,000 Units by  mouth 3 (three) times a week.   clonazePAM 0.5 MG tablet Commonly known as: KLONOPIN Take 0.5 mg by mouth daily as needed for anxiety.   clopidogrel 75 MG tablet Commonly known as: PLAVIX TAKE 1 TABLET (75 MG TOTAL) BY MOUTH DAILY. OK TO RESTART THIS MEDICATION ON [facility] 04/13/20.   Combivent Respimat 20-100 MCG/ACT Aers respimat Generic drug: Ipratropium-Albuterol Inhale 1-2 puffs into the lungs every 6 (six) hours as needed for wheezing or shortness of breath.   [employer] Mucus Extended Release 600 MG 12 hr tablet Generic drug: guaiFENesin Take 600 mg by mouth every 12 (twelve) hours as needed for congestion.   finasteride 5 MG tablet Commonly known as: PROSCAR Take 1 tablet (5 mg total) by mouth daily.   gabapentin 100 MG capsule Commonly known as: NEURONTIN Take 100 mg by mouth 2 (two) times daily.   Melatonin 5 MG Caps Take 5 mg by mouth at bedtime.   mirabegron ER 25 MG Tb24 tablet Commonly known as: MYRBETRIQ Take 1 tablet (25 mg total) by mouth daily.   nitroGLYCERIN 0.4 MG SL tablet Commonly known as: NITROSTAT Place 1 tablet (0.4 mg total) under the tongue every 5 (five) minutes as needed for chest pain.   pantoprazole 40 MG tablet Commonly known as: PROTONIX Take 40 mg by mouth daily.   predniSONE 5 MG tablet Commonly known as: DELTASONE Take 5 mg by mouth daily.   predniSONE 10 MG tablet Commonly known as: DELTASONE 4 x 1 day, 3 x 1 day, 2 x 1 day 1 x 1 day then resume 5 mg maintenance dose   rosuvastatin 20 MG tablet Commonly known as: CRESTOR TAKE 1 TABLET BY MOUTH EVERYDAY AT BEDTIME   sildenafil 100 MG tablet Commonly known as: VIAGRA Take 100 mg by mouth as needed for erectile dysfunction.   tamsulosin 0.4 MG Caps capsule Commonly known as: FLOMAX Take 1 capsule (0.4 mg total) by mouth daily.   valsartan 80 MG tablet Commonly known as: DIOVAN Take 80 mg by mouth daily.   varenicline 1 MG tablet Commonly known as: CHANTIX Take 1 mg by  mouth daily.        Allergies:  Allergies  Allergen Reactions   Metronidazole Other (See Comments)    Other reaction(s): skin reaction    Family History: Family History  Problem Relation Age of Onset   Hypertension Mother    Alzheimer's disease Mother     Social History:  reports that he quit smoking about 13 months ago. His smoking use included cigarettes. He started smoking about 69 years ago. He has a 50.25 pack-year smoking history. He has never used smokeless tobacco. He reports that he does not drink alcohol and does not use drugs.   Physical Exam: BP 120/71  Pulse 86    Ht 5\' 10"  (1.778 m)    Wt 150 lb (68 kg)    BMI 21.52 kg/m   Constitutional:  Alert and oriented, No acute distress.    Assessment & Plan:    1.  T2 Gleason 3+4 adenocarcinoma prostate NCCN risk stratification intermediate (unfavorable) We discussed staging studies of CT and bone scan which were ordered Based on SSA tables life expectancy at 5-10 years.  If no evidence of metastatic disease management options of observation and radiation therapy were discussed ADT for metastatic disease was reviewed He will be notified with staging evaluation results and further recommendations at that time.   Abbie Sons, [place] [address], [place], [place] [address] [phone]

## 2021-08-21 ENCOUNTER — Encounter: Payer: Self-pay | Admitting: Emergency Medicine

## 2021-08-23 DIAGNOSIS — J069 Acute upper respiratory infection, unspecified: Secondary | ICD-10-CM | POA: Diagnosis not present

## 2021-08-23 DIAGNOSIS — J441 Chronic obstructive pulmonary disease with (acute) exacerbation: Secondary | ICD-10-CM | POA: Diagnosis not present

## 2021-08-23 NOTE — Telephone Encounter (Signed)
Thank you very much 

## 2021-08-28 ENCOUNTER — Encounter: Payer: Self-pay | Admitting: Adult Health

## 2021-08-28 ENCOUNTER — Telehealth: Payer: Self-pay | Admitting: Emergency Medicine

## 2021-08-28 ENCOUNTER — Ambulatory Visit (INDEPENDENT_AMBULATORY_CARE_PROVIDER_SITE_OTHER): Payer: Medicare Other

## 2021-08-28 ENCOUNTER — Telehealth: Payer: Self-pay | Admitting: Adult Health

## 2021-08-28 ENCOUNTER — Other Ambulatory Visit: Payer: Self-pay

## 2021-08-28 ENCOUNTER — Ambulatory Visit: Payer: Medicare Other | Admitting: Adult Health

## 2021-08-28 VITALS — BP 130/58 | HR 89 | Temp 98.5°F | Ht 70.0 in | Wt 159.4 lb

## 2021-08-28 DIAGNOSIS — J449 Chronic obstructive pulmonary disease, unspecified: Secondary | ICD-10-CM

## 2021-08-28 DIAGNOSIS — J9611 Chronic respiratory failure with hypoxia: Secondary | ICD-10-CM | POA: Diagnosis not present

## 2021-08-28 DIAGNOSIS — J441 Chronic obstructive pulmonary disease with (acute) exacerbation: Secondary | ICD-10-CM

## 2021-08-28 MED ORDER — BENZONATATE 200 MG PO CAPS: 200.0000 mg | ORAL_CAPSULE | Freq: Three times a day (TID) | ORAL | 1 refills | Status: DC | PRN

## 2021-08-28 NOTE — Assessment & Plan Note (Signed)
Slow to resolve COPD flare  Check CXR  And sputum culture  Finish up antibiotics as described.  We will begin a slow prednisone taper.  Cough control regimen added Would also use albuterol inhaler or nebulizer as needed  Plan  Patient Instructions  Finish Doxycycline as directed  Prednisone 20mg  daily for 1 week and then 10mg  daily and then 5mg  daily  Robitussin DM 2 tsp every 4hrs as needed  Begin Albuterol neb every 6hr as needed .  Tessalon Three times a day  As needed  cough  Chest xray today  Continue on BREZTRI 2 puffs Twice daily  , rinse after use.  Continue on Oxygen 3-4 l/m with activity and At bedtime  (pulsed oxygen )  Use Continuous flow Oxygen 2l/m At bedtime   Order for continuous concentrator.  Follow up with Dr. Lamonte Sakai  in 6 weeks and As needed   Please contact office for sooner follow up if symptoms do not improve or worsen or seek emergency care

## 2021-08-28 NOTE — Progress Notes (Signed)
@Patient  ID: [name] Kemps., male    DOB: 1941-07-18, 81 y.o.   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  On chronic steroids with prednisone 10 mg daily Patient is underwent serial bronchoscopies due to pulmonary nodularity.  No evidence of malignancy but possible underlying granulomatous inflammation.  It appears to be steroid responsive.  HSP panel was negative.  TEST/EVENTS :   CT chest 02/26/2020 reviewed, showed multiple bilateral irregular peribronchovascular nodules in postinflammatory infectious pattern.  08/28/2021 Follow up : COPD , Oxygen dependent respiratory failure and pulmonary nodularity Patient complains over the last 3 weeks of increased cough, congestion with thick mucus.  Patient has been seen by his primary care provider and started on antibiotics and steroids.  He is currently on doxycycline,  and prednisone burst . Prior to this he was treated with Aztihromycin and steroid shot.  Still with ongoing cough, congestion and wheezing . Covid home test x 2 negative.  Lives independent living at [facility]. Drives. No known sick contacts at home.  Remains on Breztri twice daily.  Has albuterol inhaler and nebulizer but has not been using. He remains on oxygen 3 to 4 L with activity and at bedtime.  He has a pulsing oxygen device that he only has at home.  He does not have a regular concentrator.  At nighttime he uses 2 L pulsed oxygen. Patient says he has 4 days of doxycycline left. He denies any hemoptysis, chest pain, orthopnea or edema  Recently dx with Prostate cancer. Has bone scan and MRI brain coming up in couple of weeks.   Remains active, plays golf.   Allergies  Allergen Reactions   Metronidazole Other (See Comments)    Other reaction(s): skin reaction     Immunization History  Administered Date(s) Administered   Fluad Quad(high Dose 65+) 04/13/2020, 03/29/2021   Influenza Split 04/19/2009, 04/26/2010, 05/14/2011, 05/06/2015, 02/28/2019, 04/13/2020   Influenza, High Dose Seasonal PF 04/02/2018, 03/11/2019, 04/13/2020   Influenza,inj,Quad PF,6+ Mos 03/30/2016   PFIZER(Purple Top)SARS-COV-2 Vaccination 08/13/2019, 09/01/2019, 05/24/2020, 09/13/2020   Pneumococcal Conjugate-13 04/29/2014   Pneumococcal Polysaccharide-23 09/12/2005, 05/01/2013   Zoster, Live 04/13/2008    Past Medical History:  Diagnosis Date   Abdominal discomfort 12/07/2020   Acquired trigger finger of right middle finger 02/04/2020   Acute prostatitis 04/08/2018   Alcohol abuse    Alcohol dependence ([place]) 12/07/2020   Anal or rectal pain 04/08/2018   Annual physical exam 12/07/2020   Anorectal disorder 12/07/2020   Arthritis    Atherosclerotic heart disease of native coronary artery without angina pectoris 04/08/2018   Benign prostatic hyperplasia with lower urinary tract symptoms 12/07/2020   BPH with elevated PSA    Cancer ([place])    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   Chronic obstructive pulmonary disease with (acute) exacerbation ([place]) 12/07/2020   Chronic respiratory failure with hypoxia ([place]) 08/10/2019   Chronic sinusitis 12/07/2020   Cigarette nicotine dependence, uncomplicated 1/44/3154   Coagulation disorder ([place]) 01/13/2019   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   Contracture of palmar fascia 12/07/2020   COPD (chronic obstructive pulmonary disease) ([place])    Corn of toe 12/07/2020   Corns and callosities 04/09/2018   Coronary artery disease    2019 with stents   Coronary atherosclerosis 05/07/2018  Diarrhea 04/09/2018   Double vision with both eyes open 12/07/2020   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   Dysphagia 08/14/2018   Dyspnea    ED (erectile dysfunction)    ED (erectile dysfunction) of organic origin 12/07/2020    Elevated prostate specific antigen (PSA) 04/08/2018   Elevated PSA    Encounter for screening for other disorder 12/07/2020   Enlarged prostate without lower urinary tract symptoms (luts) 04/09/2018   Enthesopathy 12/07/2020   Essential (primary) hypertension 04/08/2018   Ex-smoker 04/09/2018   Exertional dyspnea 04/02/2018   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   Flatulence 04/08/2018   Gastro-esophageal reflux disease without esophagitis 04/08/2018   GERD (gastroesophageal reflux disease)    History of acute otitis externa 08/14/2018   Hyperlipidemia    Hypertension    Hypoxemia 12/07/2020   Kidney cysts    Left knee pain 12/07/2020   Low back pain 04/08/2018   Male erectile dysfunction 04/09/2018   Mixed hyperlipidemia 04/08/2018   Nocturia 04/09/2018   Nocturia more than twice per night 04/09/2018   Osteoarthritis of first carpometacarpal joint 04/08/2018   Osteoarthritis of knee 12/07/2020   Other long term (current) drug therapy 12/07/2020   Pain due to onychomycosis of toenail of left foot 07/20/2019   Pain in joint 04/09/2018   Pain in knee 04/09/2018   Pain in unspecified knee 12/07/2020   Pain of finger 04/09/2018   Palpitations    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[place]) 12/07/2020   Personal history of colonic polyps 12/07/2020   Pneumonia    Pneumothorax 04/12/2020   Polypharmacy 04/09/2018   Porokeratosis 01/13/2019   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   Pulmonary nodules 06/08/2016   Pure hypercholesterolemia 12/07/2020   Renal cyst 12/07/2020   Sleep disorder 04/08/2018   Status post bronchoscopy 04/12/2020   Swelling of ankle joint, left 03/16/2021   Tear of rotator cuff 04/09/2018   Tobacco user 09/18/2540   Uncomplicated alcohol dependence ([place]) 04/09/2018   Unspecified rotator cuff tear or rupture of unspecified shoulder, not specified as traumatic 12/07/2020   Unspecified tear of unspecified meniscus, current injury, unspecified knee, initial encounter 12/07/2020   Visual  disturbance 12/07/2020   Vitamin D deficiency 04/08/2018    Tobacco History: Social History   Tobacco Use  Smoking Status Former   Packs/day: 0.75   Years: 67.00   Pack years: 50.25   Types: Cigarettes   Start date: 14   Quit date: 07/13/2020   Years since quitting: 1.1  Smokeless Tobacco Never  Tobacco Comments   Recent Quit     Counseling given: Not Answered Tobacco comments: Recent Quit     Outpatient Medications Prior to Visit  Medication Sig Dispense Refill   albuterol (PROVENTIL) (2.5 MG/3ML) 0.083% nebulizer solution Take 3 mLs (2.5 mg total) by nebulization every 6 (six) hours as needed for wheezing or shortness of breath. 150 mL 5   albuterol (VENTOLIN HFA) 108 (90 Base) MCG/ACT inhaler Inhale 2 puffs into the lungs 3 (three) times daily as needed for wheezing or shortness of breath.     aspirin EC 81 MG tablet Take 1 tablet (81 mg total) by mouth daily. Swallow whole. 90 tablet 3   azithromycin (ZITHROMAX) 250 MG tablet Take 1 tablet (250 mg total) by mouth as directed. 6 tablet 0   Budeson-Glycopyrrol-Formoterol (BREZTRI AEROSPHERE) 160-9-4.8 MCG/ACT AERO Inhale 2 puffs into the lungs 2 (two) times daily. 10.7 g 5   cholecalciferol (VITAMIN D3) 25 MCG (1000 UNIT) tablet Take 1,000 Units by  mouth 3 (three) times a week.      clonazePAM (KLONOPIN) 0.5 MG tablet Take 0.5 mg by mouth daily as needed for anxiety.     clopidogrel (PLAVIX) 75 MG tablet TAKE 1 TABLET (75 MG TOTAL) BY MOUTH DAILY. OK TO RESTART THIS MEDICATION ON [facility] 04/13/20. 90 tablet 1   [employer] MUCUS EXTENDED RELEASE 600 MG 12 hr tablet Take 600 mg by mouth every 12 (twelve) hours as needed for congestion.     doxycycline (VIBRAMYCIN) 100 MG capsule Take 100 mg by mouth 2 (two) times daily.     finasteride (PROSCAR) 5 MG tablet Take 1 tablet (5 mg total) by mouth daily. 90 tablet 3   gabapentin (NEURONTIN) 100 MG capsule Take 100 mg by mouth 2 (two) times daily.     Ipratropium-Albuterol (COMBIVENT  RESPIMAT) 20-100 MCG/ACT AERS respimat Inhale 1-2 puffs into the lungs every 6 (six) hours as needed for wheezing or shortness of breath. 4 g 6   Melatonin 5 MG CAPS Take 5 mg by mouth at bedtime.     mirabegron ER (MYRBETRIQ) 25 MG TB24 tablet Take 1 tablet (25 mg total) by mouth daily. 30 tablet 3   nitroGLYCERIN (NITROSTAT) 0.4 MG SL tablet Place 1 tablet (0.4 mg total) under the tongue every 5 (five) minutes as needed for chest pain. 25 tablet 4   pantoprazole (PROTONIX) 40 MG tablet Take 40 mg by mouth daily.     rosuvastatin (CRESTOR) 20 MG tablet TAKE 1 TABLET BY MOUTH EVERYDAY AT BEDTIME 90 tablet 1   sildenafil (VIAGRA) 100 MG tablet Take 100 mg by mouth as needed for erectile dysfunction.     tamsulosin (FLOMAX) 0.4 MG CAPS capsule Take 1 capsule (0.4 mg total) by mouth daily. 30 capsule 3   valsartan (DIOVAN) 80 MG tablet Take 80 mg by mouth daily.     varenicline (CHANTIX) 1 MG tablet Take 1 mg by mouth daily.     predniSONE (DELTASONE) 5 MG tablet Take 5 mg by mouth daily. (Patient not taking: Reported on 08/28/2021)     azithromycin (ZITHROMAX Z-PAK) 250 MG tablet Take as directed (Patient not taking: Reported on 08/28/2021) 6 each 0   predniSONE (DELTASONE) 10 MG tablet 4 x 1 day, 3 x 1 day, 2 x 1 day 1 x 1 day then resume 5 mg maintenance dose (Patient not taking: Reported on 08/28/2021) 10 tablet 0   No facility-administered medications prior to visit.     Review of Systems:   Constitutional:   No  weight loss, night sweats,  Fevers, chills,  +fatigue, or  lassitude.  HEENT:   No headaches,  Difficulty swallowing,  Tooth/dental problems, or  Sore throat,                No sneezing, itching, ear ache,  +nasal congestion, post nasal drip,   CV:  No chest pain,  Orthopnea, PND, swelling in lower extremities, anasarca, dizziness, palpitations, syncope.   GI  No heartburn, indigestion, abdominal pain, nausea, vomiting, diarrhea, change in bowel habits, loss of appetite, bloody  stools.   Resp: .  No chest wall deformity  Skin: no rash or lesions.  GU: no dysuria, change in color of urine, no urgency or frequency.  No flank pain, no hematuria   MS:  No joint pain or swelling.  No decreased range of motion.  No back pain.    Physical Exam  BP (!) 130/58 (BP Location: Left Arm, Patient Position: Sitting, Cuff Size:  Normal)    Pulse 89    Temp 98.5 F (36.9 C) (Oral)    Ht 5\' 10"  (1.778 m)    Wt 159 lb 6.4 oz (72.3 kg)    SpO2 94%    BMI 22.87 kg/m   GEN: A/Ox3; pleasant , NAD,    HEENT:  [place]/AT,  NOSE-clear, THROAT-clear, no lesions, no postnasal drip or exudate noted.   NECK:  Supple w/ fair ROM; no JVD; normal carotid impulses w/o bruits; no thyromegaly or nodules palpated; no lymphadenopathy.    RESP scattered rhonchi with a few expiratory wheezes no  accessory muscle use, no dullness to percussion  CARD:  RRR, no m/r/g, no peripheral edema, pulses intact, no cyanosis or clubbing.  GI:   Soft & nt; nml bowel sounds; no organomegaly or masses detected.   Musco: Warm bil, no deformities or joint swelling noted.   Neuro: alert, no focal deficits noted.    Skin: Warm, no lesions or rashes    Lab Results:    BMET   BNP No results found for: BNP  ProBNP No results found for: PROBNP  Imaging: DG Chest 2 View  Result Date: 08/28/2021 CLINICAL DATA:  COPD exacerbation EXAM: CHEST - 2 VIEW COMPARISON:  None. FINDINGS: Normal mediastinum and cardiac silhouette. Lungs are hyperinflated. Normal pulmonary vasculature. No evidence of effusion, infiltrate, or pneumothorax. No acute bony abnormality. Degenerative osteophytosis of the spine. IMPRESSION: Hyperinflated lungs.  No acute findings Electronically Signed   By: Suzy Bouchard M.D.   On: 08/28/2021 16:10    gentamicin (GARAMYCIN) injection 80 mg     Date Action Dose Route User   08/11/2021 1140 Given 80 mg Intramuscular (Other) Narda Bonds, Jessica L, CMA      levofloxacin (LEVAQUIN) tablet  500 mg     Date Action Dose Route User   08/11/2021 1140 Given 500 mg Oral Chrystie Nose, CMA       PFT Results Latest Ref Rng & Units 05/02/2018  FVC-Pre L 2.50  FVC-Predicted Pre % 68  FVC-Post L 2.52  FVC-Predicted Post % 68  Pre FEV1/FVC % % 37  Post FEV1/FCV % % 40  FEV1-Pre L 0.92  FEV1-Predicted Pre % 35  FEV1-Post L 1.02  DLCO uncorrected ml/min/mmHg 11.04  DLCO UNC% % 38  DLCO corrected ml/min/mmHg 10.72  DLCO COR %Predicted % 37  DLVA Predicted % 45  TLC L 8.50  TLC % Predicted % 131  RV % Predicted % 213    No results found for: NITRICOXIDE      Assessment & Plan:   COPD (chronic obstructive pulmonary disease) (HCC) Slow to resolve COPD flare  Check CXR  And sputum culture  Finish up antibiotics as described.  We will begin a slow prednisone taper.  Cough control regimen added Would also use albuterol inhaler or nebulizer as needed  Plan  Patient Instructions  Finish Doxycycline as directed  Prednisone 20mg  daily for 1 week and then 10mg  daily and then 5mg  daily  Robitussin DM 2 tsp every 4hrs as needed  Begin Albuterol neb every 6hr as needed .  Tessalon Three times a day  As needed  cough  Chest xray today  Continue on BREZTRI 2 puffs Twice daily  , rinse after use.  Continue on Oxygen 3-4 l/m with activity and At bedtime  (pulsed oxygen )  Use Continuous flow Oxygen 2l/m At bedtime   Order for continuous concentrator.  Follow up with Dr. Lamonte Sakai  in 6 weeks and  As needed   Please contact office for sooner follow up if symptoms do not improve or worsen or seek emergency care        Chronic respiratory failure with hypoxia ([place]) Continue on oxygen to maintain O2 saturations greater than 88 to 9%. Would prefer that he use continuous flow oxygen at nighttime.  Order for POC sent to his [place], NP 08/28/2021

## 2021-08-28 NOTE — Patient Instructions (Addendum)
Finish Doxycycline as directed  Prednisone 20mg  daily for 1 week and then 10mg  daily and then 5mg  daily  Robitussin DM 2 tsp every 4hrs as needed  Begin Albuterol neb every 6hr as needed .  Tessalon Three times a day  As needed  cough  Chest xray today  Continue on BREZTRI 2 puffs Twice daily  , rinse after use.  Continue on Oxygen 3-4 l/m with activity and At bedtime  (pulsed oxygen )  Use Continuous flow Oxygen 2l/m At bedtime   Order for continuous concentrator.  Follow up with Dr. Lamonte Sakai  in 6 weeks and As needed   Please contact office for sooner follow up if symptoms do not improve or worsen or seek emergency care

## 2021-08-28 NOTE — Assessment & Plan Note (Signed)
Continue on oxygen to maintain O2 saturations greater than 88 to 9%. Would prefer that he use continuous flow oxygen at nighttime.  Order for POC sent to his DME company

## 2021-08-29 MED ORDER — PREDNISONE 10 MG PO TABS: ORAL_TABLET | ORAL | 0 refills | Status: DC

## 2021-08-29 NOTE — Progress Notes (Signed)
Called and spoke with patient, advised of results/recommendations per Tammy Parrett NP.  He verbalized understanding.  Nothing further needed.

## 2021-08-29 NOTE — Telephone Encounter (Signed)
Spoke to patient.  Patient is requesting Rx for prednisone 10mg - 2 tab x1 week then 1 tablet x1 week. He has 5mg  on hand.  Rx sent to preferred pharmacy.  He is aware that Lavella Lemons was sent to pharmacy on 08/28/2021. Nothing further needed.

## 2021-08-31 ENCOUNTER — Ambulatory Visit: Payer: Medicare Other

## 2021-08-31 ENCOUNTER — Encounter: Payer: Self-pay | Admitting: Podiatry

## 2021-08-31 ENCOUNTER — Ambulatory Visit: Payer: Medicare Other | Admitting: Podiatry

## 2021-08-31 ENCOUNTER — Other Ambulatory Visit: Payer: Self-pay

## 2021-08-31 ENCOUNTER — Encounter: Payer: Self-pay | Admitting: Urology

## 2021-08-31 DIAGNOSIS — M205X2 Other deformities of toe(s) (acquired), left foot: Secondary | ICD-10-CM

## 2021-08-31 DIAGNOSIS — D689 Coagulation defect, unspecified: Secondary | ICD-10-CM

## 2021-08-31 NOTE — Progress Notes (Signed)
This patient returns to my office for at risk foot care.  This patient requires this care by a professional since this patient will be at risk due to having  pvd and coagulation defect. Patient is taking plavix. Patient develops a painful area and bony prominence  under left hallux.  This patient presents for at risk foot care today.   General Appearance  Alert, conversant and in no acute stress.  Vascular  Dorsalis pedis and posterior tibial  pulses are  weakly palpable  bilaterally.  Capillary return is within normal limits  bilaterally. Cold feet  bilaterally. Absent digital hair.  Neurologic  Senn-Weinstein monofilament wire test within normal limits  bilaterally. Muscle power within normal limits bilaterally.  Nails Normal nails  Noted  B/L.  No evidence of bacterial infection or drainage bilaterally.  Orthopedic  No limitations of motion  feet .  No crepitus or effusions noted.  No bony pathology or digital deformities noted. HAV  B/L with hallux limitus.  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  Skin  normotropic skin with no porokeratosis noted bilaterally.  No signs of infections or ulcers noted.   Callus plantar aspect left hallux has resolved.  HAV/DJD right foot.     Consent was obtained for treatment procedures.  Discussed his painful left big toe joint and big toe with this patient.  We decided to x-ray his foot to have an updated x-ray.  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  Calcified vessels noted left foot and leg.  There is a bone cyst in tibia left foot.     Return office visit   prn                  Told patient to return for periodic foot care and evaluation due to potential at risk complications.   Gardiner Barefoot DPM

## 2021-09-07 ENCOUNTER — Ambulatory Visit
Admission: RE | Admit: 2021-09-07 | Discharge: 2021-09-07 | Disposition: A | Discharge status: Home / Self Care | Payer: Medicare Other | Source: Ambulatory Visit | Attending: Urology | Admitting: Urology

## 2021-09-07 ENCOUNTER — Encounter
Admission: RE | Admit: 2021-09-07 | Discharge: 2021-09-07 | Disposition: A | Discharge status: Home / Self Care | Payer: Medicare Other | Source: Ambulatory Visit | Attending: Urology | Admitting: Urology

## 2021-09-07 ENCOUNTER — Other Ambulatory Visit: Payer: Self-pay

## 2021-09-07 DIAGNOSIS — N281 Cyst of kidney, acquired: Secondary | ICD-10-CM | POA: Diagnosis not present

## 2021-09-07 DIAGNOSIS — C61 Malignant neoplasm of prostate: Secondary | ICD-10-CM | POA: Diagnosis present

## 2021-09-07 DIAGNOSIS — K5730 Diverticulosis of large intestine without perforation or abscess without bleeding: Secondary | ICD-10-CM | POA: Diagnosis not present

## 2021-09-07 DIAGNOSIS — N3289 Other specified disorders of bladder: Secondary | ICD-10-CM | POA: Diagnosis not present

## 2021-09-07 LAB — POCT I-STAT CREATININE: Creatinine, Ser: 1.2 mg/dL (ref 0.61–1.24)

## 2021-09-07 MED ORDER — TECHNETIUM TC 99M MEDRONATE IV KIT
20.0000 | PACK | Freq: Once | INTRAVENOUS | Status: AC | PRN
  Administered 2021-09-07: 22 via INTRAVENOUS

## 2021-09-07 MED ORDER — IOHEXOL 300 MG/ML  SOLN
100.0000 mL | Freq: Once | INTRAMUSCULAR | Status: AC | PRN
  Administered 2021-09-07: 100 mL via INTRAVENOUS

## 2021-09-08 ENCOUNTER — Encounter: Payer: Self-pay | Admitting: *Deleted

## 2021-09-14 ENCOUNTER — Encounter: Payer: Self-pay | Admitting: Emergency Medicine

## 2021-09-15 NOTE — Telephone Encounter (Signed)
Separate encounter was created. Closing this encounter.

## 2021-09-16 DIAGNOSIS — J9611 Chronic respiratory failure with hypoxia: Secondary | ICD-10-CM | POA: Diagnosis not present

## 2021-09-16 DIAGNOSIS — J449 Chronic obstructive pulmonary disease, unspecified: Secondary | ICD-10-CM | POA: Diagnosis not present

## 2021-09-27 ENCOUNTER — Other Ambulatory Visit: Payer: Self-pay | Admitting: Cardiology

## 2021-10-02 DIAGNOSIS — M5136 Other intervertebral disc degeneration, lumbar region: Secondary | ICD-10-CM | POA: Diagnosis not present

## 2021-10-02 DIAGNOSIS — Z7952 Long term (current) use of systemic steroids: Secondary | ICD-10-CM | POA: Diagnosis not present

## 2021-10-02 DIAGNOSIS — M51369 Other intervertebral disc degeneration, lumbar region without mention of lumbar back pain or lower extremity pain: Secondary | ICD-10-CM | POA: Insufficient documentation

## 2021-10-02 DIAGNOSIS — M5030 Other cervical disc degeneration, unspecified cervical region: Secondary | ICD-10-CM | POA: Diagnosis not present

## 2021-10-02 DIAGNOSIS — Z13820 Encounter for screening for osteoporosis: Secondary | ICD-10-CM | POA: Diagnosis not present

## 2021-10-02 HISTORY — DX: Other intervertebral disc degeneration, lumbar region without mention of lumbar back pain or lower extremity pain: M51.369

## 2021-10-02 HISTORY — DX: Other cervical disc degeneration, unspecified cervical region: M50.30

## 2021-10-02 HISTORY — DX: Other intervertebral disc degeneration, lumbar region: M51.36

## 2021-10-05 ENCOUNTER — Encounter: Payer: Self-pay | Admitting: Cardiology

## 2021-10-05 ENCOUNTER — Other Ambulatory Visit: Payer: Self-pay

## 2021-10-05 ENCOUNTER — Ambulatory Visit: Payer: Medicare Other | Admitting: Cardiology

## 2021-10-05 VITALS — BP 94/50 | HR 78 | Ht 70.6 in | Wt 156.8 lb

## 2021-10-05 DIAGNOSIS — J431 Panlobular emphysema: Secondary | ICD-10-CM

## 2021-10-05 DIAGNOSIS — E782 Mixed hyperlipidemia: Secondary | ICD-10-CM

## 2021-10-05 DIAGNOSIS — I2510 Atherosclerotic heart disease of native coronary artery without angina pectoris: Secondary | ICD-10-CM | POA: Diagnosis not present

## 2021-10-05 DIAGNOSIS — I10 Essential (primary) hypertension: Secondary | ICD-10-CM

## 2021-10-05 NOTE — Patient Instructions (Signed)
Medication Instructions:  Your physician recommends that you continue on your current medications as directed. Please refer to the Current Medication list given to you today.  *If you need a refill on your cardiac medications before your next appointment, please call your pharmacy*   Lab Work: None ordered If you have labs (blood work) drawn today and your tests are completely normal, you will receive your results only by: MyChart Message (if you have MyChart) OR A paper copy in the mail If you have any lab test that is abnormal or we need to change your treatment, we will call you to review the results.   Testing/Procedures: None ordered   Follow-Up: At [facility], you and your health needs are our priority.  As part of our continuing mission to provide you with exceptional heart care, we have created designated Provider Care Teams.  These Care Teams include your primary Cardiologist (physician) and Advanced Practice Providers (APPs -  Physician Assistants and Nurse Practitioners) who all work together to provide you with the care you need, when you need it.  We recommend signing up for the patient portal called "MyChart".  Sign up information is provided on this After Visit Summary.  MyChart is used to connect with patients for Virtual Visits (Telemedicine).  Patients are able to view lab/test results, encounter notes, upcoming appointments, etc.  Non-urgent messages can be sent to your provider as well.   To learn more about what you can do with MyChart, go to [email or web address].    Your next appointment:   12 month(s)  The format for your next appointment:   In Person  Provider:   Rajan Revankar, MD   Other Instructions NA  

## 2021-10-05 NOTE — Progress Notes (Signed)
Cardiology Office Note:    Date:  10/05/2021   ID:  [name], DOB 12-14-1940, MRN [id number]  PCP:  Josetta Huddle, MD  Cardiologist:  Jenean Lindau, MD   Referring MD: Josetta Huddle, MD    ASSESSMENT:    1. Coronary artery disease involving native coronary artery of native heart without angina pectoris   2. Essential (primary) hypertension   3. Mixed hyperlipidemia   4. Panlobular emphysema (HCC)    PLAN:    In order of problems listed above:  Coronary artery disease: Secondary prevention stressed to the patient.  Importance of compliance with diet medication stressed any vocalized understanding.  He was advised to be ambulatory to the best of his ability. Essential hypertension: Blood pressure stable and diet was emphasized.  Lifestyle modification urged. Mixed dyslipidemia: On statin therapy and labs were reviewed.  Status managed by primary care. COPD on oxygen: Stable.  He has advanced COPD.  He also mentions to me that he is recently diagnosed with prostate cancer.  Fortunately it has not spread. Patient will be seen in follow-up appointment in 12 months or earlier if the patient has any concerns    Medication Adjustments/Labs and Tests Ordered: Current medicines are reviewed at length with the patient today.  Concerns regarding medicines are outlined above.  No orders of the defined types were placed in this encounter.  No orders of the defined types were placed in this encounter.    No chief complaint on file.    History of Present Illness:    Sadrac Zeoli. is a 81 y.o. male.  Patient has past medical history of coronary artery disease, essential hypertension, dyslipidemia and COPD on oxygen.  He denies any problems at this time and takes care of activities of daily living.  No chest pain orthopnea or PND.  At the time of my evaluation, the patient is alert awake oriented and in no distress.  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   Chronic obstructive pulmonary disease with (acute) exacerbation ([place]) 12/07/2020   Chronic respiratory failure with hypoxia ([place]) 08/10/2019   Chronic sinusitis 12/07/2020   Cigarette nicotine dependence, uncomplicated 37/62/8315   Coagulation disorder ([place]) 01/13/2019   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   Contracture of palmar fascia 12/07/2020   COPD (chronic obstructive pulmonary disease) ([place])    Corn of toe 12/07/2020   Corns and callosities 04/09/2018   Coronary artery disease    2019 with stents   Coronary atherosclerosis 05/07/2018   Degenerative disc disease, 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   Degenerative disc disease, 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   Diarrhea 04/09/2018   Double vision with both eyes open 12/07/2020   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   Dysphagia 08/14/2018   Dyspnea    ED (erectile dysfunction)    ED (erectile dysfunction) of organic origin 12/07/2020   Elevated prostate specific antigen (PSA) 04/08/2018  Elevated PSA    Encounter for screening for other disorder 12/07/2020   Enlarged prostate without lower urinary tract symptoms (luts) 04/09/2018   Enthesopathy 12/07/2020   Essential (primary) hypertension 04/08/2018   Ex-smoker 04/09/2018   Exertional dyspnea 04/02/2018   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   Flatulence 04/08/2018   Gastro-esophageal reflux disease without esophagitis  04/08/2018   GERD (gastroesophageal reflux disease)    History of acute otitis externa 08/14/2018   Hyperlipidemia    Hypertension    Hypoxemia 12/07/2020   Kidney cysts    Left knee pain 12/07/2020   Low back pain 04/08/2018   Male erectile dysfunction 04/09/2018   Mixed hyperlipidemia 04/08/2018   Nocturia more than twice per night 04/09/2018   Osteoarthritis of first carpometacarpal joint 04/08/2018   Osteoarthritis of knee 12/07/2020   Other long term (current) drug therapy 12/07/2020   Pain due to onychomycosis of toenail of left foot 07/20/2019   Pain in joint 04/09/2018   Pain in knee 04/09/2018   Pain in unspecified knee 12/07/2020   Pain of finger 04/09/2018   Palpitations    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[place]) 12/07/2020   Personal history of colonic polyps 12/07/2020   Pneumonia    Pneumothorax 04/12/2020   Polypharmacy 04/09/2018   Porokeratosis 01/13/2019   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[place]) 12/07/2020   Pulmonary nodules 06/08/2016   Pure hypercholesterolemia 12/07/2020   Renal cyst 12/07/2020   Sleep disorder 04/08/2018   Status post bronchoscopy 04/12/2020   Tear of rotator cuff 04/09/2018   Tobacco user 67/34/1937   Uncomplicated alcohol dependence ([place]) 04/09/2018   Unspecified rotator cuff tear or rupture of unspecified shoulder, not specified as traumatic 12/07/2020   Unspecified tear of unspecified meniscus, current injury, unspecified knee, initial encounter 12/07/2020   Visual disturbance 12/07/2020   Vitamin D deficiency 04/08/2018    Past Surgical History:  Procedure Laterality Date   BRONCHIAL BIOPSY  10/13/2019   Procedure: BRONCHIAL BIOPSIES;  Surgeon: Collene Gobble, MD;  Location: [place];  Service: Pulmonary;;   BRONCHIAL BIOPSY  04/12/2020   Procedure: BRONCHIAL BIOPSIES;  Surgeon: Collene Gobble, MD;  Location: [place];  Service: Pulmonary;;   BRONCHIAL BRUSHINGS  10/13/2019   Procedure: BRONCHIAL BRUSHINGS;  Surgeon: Collene Gobble,  MD;  Location: [facility] ENDOSCOPY;  Service: Pulmonary;;   BRONCHIAL BRUSHINGS  04/12/2020   Procedure: BRONCHIAL BRUSHINGS;  Surgeon: Collene Gobble, MD;  Location: [facility] ENDOSCOPY;  Service: Pulmonary;;   BRONCHIAL NEEDLE ASPIRATION BIOPSY  10/13/2019   Procedure: BRONCHIAL NEEDLE ASPIRATION BIOPSIES;  Surgeon: Collene Gobble, MD;  Location: [facility] ENDOSCOPY;  Service: Pulmonary;;   BRONCHIAL NEEDLE ASPIRATION BIOPSY  04/12/2020   Procedure: BRONCHIAL NEEDLE ASPIRATION BIOPSIES;  Surgeon: Collene Gobble, MD;  Location: [facility] ENDOSCOPY;  Service: Pulmonary;;   BRONCHIAL WASHINGS  10/13/2019   Procedure: BRONCHIAL WASHINGS;  Surgeon: Collene Gobble, MD;  Location: [facility] ENDOSCOPY;  Service: Pulmonary;;   BRONCHIAL WASHINGS  04/12/2020   Procedure: BRONCHIAL WASHINGS;  Surgeon: Collene Gobble, MD;  Location: [place] ENDOSCOPY;  Service: Pulmonary;;   CARDIAC CATHETERIZATION  2002   50% RCA   CATARACT EXTRACTION, BILATERAL     CORONARY STENT INTERVENTION N/A 04/15/2018   Procedure: CORONARY STENT INTERVENTION;  Surgeon: Jettie Booze, MD;  Location: [place] CV LAB;  Service: Cardiovascular;  Laterality: N/A;  om1   EYE SURGERY     KNEE ARTHROSCOPY     LEFT HEART CATH AND CORONARY ANGIOGRAPHY N/A 04/15/2018   Procedure: LEFT  HEART CATH AND CORONARY ANGIOGRAPHY;  Surgeon: Jettie Booze, MD;  Location: [place] CV [facility];  Service: Cardiovascular;  Laterality: N/A;   MULTIPLE TOOTH EXTRACTIONS     TONSILLECTOMY     VIDEO BRONCHOSCOPY  04/12/2020   VIDEO BRONCHOSCOPY WITH ENDOBRONCHIAL NAVIGATION N/A 07/26/2016   Procedure: VIDEO BRONCHOSCOPY WITH ENDOBRONCHIAL NAVIGATION;  Surgeon: Collene Gobble, MD;  Location: [place];  Service: Thoracic;  Laterality: N/A;   VIDEO BRONCHOSCOPY WITH ENDOBRONCHIAL NAVIGATION N/A 10/13/2019   Procedure: [facility] AND VIDEO BRONCHOSCOPY WITH ENDOBRONCHIAL NAVIGATION;  Surgeon: Collene Gobble, MD;  Location: [place] ENDOSCOPY;  Service: Pulmonary;  Laterality: N/A;   VIDEO  BRONCHOSCOPY WITH ENDOBRONCHIAL NAVIGATION N/A 04/12/2020   Procedure: VIDEO BRONCHOSCOPY WITH ENDOBRONCHIAL NAVIGATION;  Surgeon: Collene Gobble, MD;  Location: [place] ENDOSCOPY;  Service: Pulmonary;  Laterality: N/A;    Current Medications: Current Meds  Medication Sig   albuterol (PROVENTIL) (2.5 MG/3ML) 0.083% nebulizer solution Take 3 mLs (2.5 mg total) by nebulization every 6 (six) hours as needed for wheezing or shortness of breath.   albuterol (VENTOLIN HFA) 108 (90 Base) MCG/ACT inhaler Inhale 2 puffs into the lungs 3 (three) times daily as needed for wheezing or shortness of breath.   aspirin EC 81 MG tablet Take 1 tablet (81 mg total) by mouth daily. Swallow whole.   azithromycin (ZITHROMAX) 250 MG tablet Take 1 tablet (250 mg total) by mouth as directed.   benzonatate (TESSALON) 200 MG capsule Take 1 capsule (200 mg total) by mouth 3 (three) times daily as needed for cough.   Budeson-Glycopyrrol-Formoterol (BREZTRI AEROSPHERE) 160-9-4.8 MCG/ACT AERO Inhale 2 puffs into the lungs 2 (two) times daily.   cholecalciferol (VITAMIN D3) 25 MCG (1000 UNIT) tablet Take 1,000 Units by mouth 3 (three) times a week.    clonazePAM (KLONOPIN) 0.5 MG tablet Take 0.5 mg by mouth daily as needed for anxiety.   clopidogrel (PLAVIX) 75 MG tablet Take 1 tablet (75 mg total) by mouth daily.   gabapentin (NEURONTIN) 100 MG capsule Take 100 mg by mouth 2 (two) times daily.   Ipratropium-Albuterol (COMBIVENT RESPIMAT) 20-100 MCG/ACT AERS respimat Inhale 1-2 puffs into the lungs every 6 (six) hours as needed for wheezing or shortness of breath.   Melatonin 5 MG CAPS Take 5 mg by mouth at bedtime.   nitroGLYCERIN (NITROSTAT) 0.4 MG SL tablet Place 1 tablet (0.4 mg total) under the tongue every 5 (five) minutes as needed for chest pain.   pantoprazole (PROTONIX) 40 MG tablet Take 40 mg by mouth daily.   predniSONE (DELTASONE) 10 MG tablet 2tabs x1 week then 1tab x1 week.   predniSONE (DELTASONE) 5 MG tablet Take  5 mg by mouth daily.   rosuvastatin (CRESTOR) 20 MG tablet TAKE 1 TABLET BY MOUTH EVERYDAY AT BEDTIME   sildenafil (VIAGRA) 100 MG tablet Take 100 mg by mouth as needed for erectile dysfunction.   tamsulosin (FLOMAX) 0.4 MG CAPS capsule Take 1 capsule (0.4 mg total) by mouth daily.   valsartan (DIOVAN) 80 MG tablet Take 80 mg by mouth daily.   varenicline (CHANTIX) 1 MG tablet Take 1 mg by mouth daily.     Allergies:   Metronidazole   Social History   Socioeconomic History   Marital status: Married    Spouse name: Not on file   Number of children: Not on file   Years of education: Not on file   Highest education level: Not on file  Occupational History   Not on file  Tobacco Use   Smoking status: Former    Packs/day: 0.75    Years: 67.00    Pack years: 50.25    Types: Cigarettes    Start date: 76    Quit date: 07/13/2020    Years since quitting: 1.2   Smokeless tobacco: Never   Tobacco comments:    Recent Quit    Vaping Use   Vaping Use: Former  Substance and Sexual Activity   Alcohol use: No    Alcohol/week: 0.0 standard drinks    Comment: quit drinking 2014   Drug use: No   Sexual activity: Not on file  Other Topics Concern   Not on file  Social History Narrative   Not on file   Social Determinants of Health   Financial Resource Strain: Not on file  Food Insecurity: Not on file  Transportation Needs: Not on file  Physical Activity: Not on file  Stress: Not on file  Social Connections: Not on file     Family History: The patient's family history includes Alzheimer's disease in his mother; Hypertension in his mother.  ROS:   Please see the history of present illness.    All other systems reviewed and are negative.  EKGs/Labs/Other Studies Reviewed:    The following studies were reviewed today: I discussed my findings with the patient at length   Recent Labs: 09/07/2021: Creatinine, Ser 1.20  Recent Lipid Panel    Component Value Date/Time    CHOL 122 06/06/2018 1012   TRIG 54 06/06/2018 1012   HDL 61 06/06/2018 1012   CHOLHDL 2.0 06/06/2018 1012   LDLCALC 50 06/06/2018 1012    Physical Exam:    VS:  BP (!) 94/50    Pulse 78    Ht 5' 10.6" (1.793 m)    Wt 156 lb 12.8 oz (71.1 kg)    SpO2 95%    BMI 22.12 kg/m     Wt Readings from Last 3 Encounters:  10/05/21 156 lb 12.8 oz (71.1 kg)  08/28/21 159 lb 6.4 oz (72.3 kg)  08/18/21 150 lb (68 kg)     GEN: Patient is in no acute distress HEENT: Normal NECK: No JVD; No carotid bruits LYMPHATICS: No lymphadenopathy CARDIAC: Hear sounds regular, 2/6 systolic murmur at the apex. RESPIRATORY:  Clear to auscultation without rales, wheezing or rhonchi  ABDOMEN: Soft, non-tender, non-distended MUSCULOSKELETAL:  No edema; No deformity  SKIN: Warm and dry NEUROLOGIC:  Alert and oriented x 3 PSYCHIATRIC:  Normal affect   Signed, Jenean Lindau, MD  10/05/2021 3:09 PM    [facility]

## 2021-10-12 DIAGNOSIS — M8588 Other specified disorders of bone density and structure, other site: Secondary | ICD-10-CM | POA: Diagnosis not present

## 2021-10-14 DIAGNOSIS — J449 Chronic obstructive pulmonary disease, unspecified: Secondary | ICD-10-CM | POA: Diagnosis not present

## 2021-10-14 DIAGNOSIS — J9611 Chronic respiratory failure with hypoxia: Secondary | ICD-10-CM | POA: Diagnosis not present

## 2021-10-15 ENCOUNTER — Encounter: Payer: Self-pay | Admitting: Emergency Medicine

## 2021-10-18 MED ORDER — BREZTRI AEROSPHERE 160-9-4.8 MCG/ACT IN AERO: 2.0000 | INHALATION_SPRAY | Freq: Two times a day (BID) | RESPIRATORY_TRACT | 11 refills | Status: DC

## 2021-10-30 ENCOUNTER — Other Ambulatory Visit: Payer: Self-pay | Admitting: Adult Health

## 2021-10-30 ENCOUNTER — Other Ambulatory Visit: Payer: Self-pay | Admitting: Urology

## 2021-10-31 DIAGNOSIS — J441 Chronic obstructive pulmonary disease with (acute) exacerbation: Secondary | ICD-10-CM | POA: Diagnosis not present

## 2021-11-06 DIAGNOSIS — L853 Xerosis cutis: Secondary | ICD-10-CM | POA: Diagnosis not present

## 2021-11-06 DIAGNOSIS — L814 Other melanin hyperpigmentation: Secondary | ICD-10-CM | POA: Diagnosis not present

## 2021-11-06 DIAGNOSIS — S51812A Laceration without foreign body of left forearm, initial encounter: Secondary | ICD-10-CM | POA: Diagnosis not present

## 2021-11-06 DIAGNOSIS — L905 Scar conditions and fibrosis of skin: Secondary | ICD-10-CM | POA: Diagnosis not present

## 2021-11-10 ENCOUNTER — Ambulatory Visit: Payer: Medicare Other | Admitting: Cardiology

## 2021-11-14 DIAGNOSIS — J449 Chronic obstructive pulmonary disease, unspecified: Secondary | ICD-10-CM | POA: Diagnosis not present

## 2021-11-14 DIAGNOSIS — J9611 Chronic respiratory failure with hypoxia: Secondary | ICD-10-CM | POA: Diagnosis not present

## 2021-11-25 ENCOUNTER — Other Ambulatory Visit: Payer: Self-pay | Admitting: Cardiology

## 2021-12-14 ENCOUNTER — Other Ambulatory Visit: Payer: Self-pay | Admitting: Emergency Medicine

## 2021-12-14 DIAGNOSIS — J449 Chronic obstructive pulmonary disease, unspecified: Secondary | ICD-10-CM | POA: Diagnosis not present

## 2021-12-14 DIAGNOSIS — J9611 Chronic respiratory failure with hypoxia: Secondary | ICD-10-CM | POA: Diagnosis not present

## 2021-12-15 ENCOUNTER — Ambulatory Visit: Payer: Medicare Other | Admitting: Adult Health

## 2021-12-15 ENCOUNTER — Encounter: Payer: Self-pay | Admitting: Adult Health

## 2021-12-15 VITALS — BP 120/70 | HR 82 | Temp 98.0°F | Ht 70.0 in | Wt 156.8 lb

## 2021-12-15 DIAGNOSIS — J9611 Chronic respiratory failure with hypoxia: Secondary | ICD-10-CM

## 2021-12-15 DIAGNOSIS — R918 Other nonspecific abnormal finding of lung field: Secondary | ICD-10-CM | POA: Diagnosis not present

## 2021-12-15 DIAGNOSIS — J431 Panlobular emphysema: Secondary | ICD-10-CM | POA: Diagnosis not present

## 2021-12-15 MED ORDER — PREDNISONE 10 MG PO TABS: ORAL_TABLET | ORAL | 0 refills | Status: DC

## 2021-12-15 MED ORDER — DOXYCYCLINE HYCLATE 100 MG PO TABS: 100.0000 mg | ORAL_TABLET | Freq: Two times a day (BID) | ORAL | 0 refills | Status: DC

## 2021-12-15 NOTE — Telephone Encounter (Signed)
Yes Trelegy is similar .  Sorry to hear this on cost. Totally agree.   We can leave you a sample up front if you would like.

## 2021-12-15 NOTE — Assessment & Plan Note (Signed)
Pulmonary nodularity noted on CT chest.  Patient has upcoming CT chest August 2023.

## 2021-12-15 NOTE — Patient Instructions (Addendum)
Continue on BREZTRI 2 puffs Twice daily  , rinse after use.  Albuterol Inhaler or neb as needed.  Continue on Oxygen 3-4 l/m with activity and At bedtime  (pulsed oxygen )  Use Continuous flow Oxygen 2l/m At bedtime   CT chest in end of August 2023 -for lung nodules  Have a great time on your trip.  Antibiotic /steroid burst has been sent to your pharmacy if you have a flare while traveling (use sunscreen) , if not improving make sure you go to urgent care or ER while traveling .  Follow up with Dr. Lamonte Sakai  in 3-4 months and As needed

## 2021-12-15 NOTE — Progress Notes (Signed)
$'@Patient'c$  ID: [name] Kemps., male    DOB: [birth year], 81 y.o.   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  Patient is on chronic steroids with prednisone 10 mg daily Underwent serial bronchoscopies due to pulmonary nodularity.  No evidence of malignancy but possible underlying granulomatous inflammation.  It has been steroid responsive.  HSP panel was negative.  TEST/EVENTS :  CT chest 02/26/2020 reviewed, showed multiple bilateral irregular peribronchovascular nodules in postinflammatory infectious pattern  CT chest March 04, 2021 dominant pulmonary nodules are decreased in size.  Interval new nodules are noted.  Maximum size 7 mm (plan repeat 02/2022)   PFTs October 2019 showed severe COPD with FEV1 at 38%, ratio 40, FVC 68%, no significant bronchodilator response, mid flow reversibility, DLCO 38%.  12/15/2021 Follow up : COPD , O2 RF , Pulmonary nodularity  Patient returns for a 74-monthfollow-up.  Patient was seen last visit for a slow to resolve COPD exacerbation.  Been treated with multiple antibiotics and steroid burst.  Patient says that his symptoms did finally resolve .  Overall says he is doing well.  He remains on Breztri twice daily.  Is on prednisone 5 mg daily.  He remains on oxygen 2 L at rest and at bedtime and 3 to 4 L with activity. Patient says he remains active.  [facility]  He is able to drive.  Goes to gym 3 days a week.   [place] 2 [place].  Wants to have a prescription for antibiotics and steroids to have on hold in case he has a flare while he is gone.  We went over in detail how to use the antibiotic and steroid.  Advised that if symptoms not improving he will need to seek local  medical care.  Patient is to contact our office if he gets sick traveling .   Allergies  Allergen Reactions   Metronidazole Other (See Comments)    Other reaction(s): skin reaction    Immunization History  Administered Date(s) Administered   Fluad Quad(high Dose 65+) 04/13/2020, 03/29/2021   Influenza Split 04/19/2009, 04/26/2010, 05/14/2011, 05/06/2015, 02/28/2019, 04/13/2020   Influenza, High Dose Seasonal PF 04/02/2018, 03/11/2019, 04/13/2020   Influenza,inj,Quad PF,6+ Mos 03/30/2016   PFIZER(Purple Top)SARS-COV-2 Vaccination 08/13/2019, 09/01/2019, 05/24/2020, 09/13/2020   Pneumococcal Conjugate-13 04/29/2014   Pneumococcal Polysaccharide-23 09/12/2005, 05/01/2013   Zoster, Live 04/13/2008    Past Medical History:  Diagnosis Date   Abdominal discomfort 12/07/2020   Acquired trigger finger of right middle finger 02/04/2020   Acute prostatitis 04/08/2018   Alcohol abuse    Alcohol dependence ([place] 12/07/2020   Anal or rectal pain 04/08/2018   Annual physical exam 12/07/2020   Anorectal disorder 12/07/2020   Arthritis    Atherosclerotic heart disease of native coronary artery without angina pectoris 04/08/2018   Benign prostatic hyperplasia with lower urinary tract symptoms 12/07/2020   BPH with elevated PSA    Cancer ([facility]    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   Chronic obstructive pulmonary disease with (acute) exacerbation ([facility] 12/07/2020   Chronic respiratory failure with hypoxia ([place] 08/10/2019   Chronic sinusitis 12/07/2020   Cigarette nicotine dependence, uncomplicated 056/81/2751  Coagulation disorder ([facility] 01/13/2019   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   Contracture of palmar fascia 12/07/2020   COPD (chronic obstructive pulmonary disease) (HCC)    Corn of toe 12/07/2020   Corns and callosities 04/09/2018   Coronary artery disease    2019 with stents   Coronary atherosclerosis 05/07/2018   Degenerative disc  disease, 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   Degenerative disc disease, 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   Diarrhea 04/09/2018   Double vision with both eyes open 12/07/2020   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   Dysphagia 08/14/2018   Dyspnea    ED (erectile dysfunction)    ED (erectile dysfunction) of organic origin 12/07/2020   Elevated prostate specific antigen (PSA) 04/08/2018   Elevated PSA    Encounter for screening for other disorder 12/07/2020   Enlarged prostate without lower urinary tract symptoms (luts) 04/09/2018   Enthesopathy 12/07/2020   Essential (primary) hypertension 04/08/2018   Ex-smoker 04/09/2018   Exertional dyspnea 04/02/2018   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   Flatulence 04/08/2018   Gastro-esophageal reflux disease without esophagitis 04/08/2018   GERD (gastroesophageal reflux disease)    History of acute otitis externa 08/14/2018   Hyperlipidemia    Hypertension    Hypoxemia 12/07/2020   Kidney cysts    Left knee pain 12/07/2020   Low back pain 04/08/2018   Male erectile dysfunction 04/09/2018   Mixed hyperlipidemia 04/08/2018   Nocturia more than twice per night 04/09/2018   Osteoarthritis of first carpometacarpal joint 04/08/2018   Osteoarthritis of knee 12/07/2020   Other long term (current) drug therapy 12/07/2020   Pain due to onychomycosis of toenail of left foot 07/20/2019   Pain in joint 04/09/2018   Pain in knee 04/09/2018   Pain in unspecified knee 12/07/2020   Pain of finger 04/09/2018   Palpitations    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[place]) 12/07/2020   Personal history of colonic polyps 12/07/2020   Pneumonia    Pneumothorax 04/12/2020   Polypharmacy 04/09/2018   Porokeratosis 01/13/2019   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   Pulmonary nodules 06/08/2016   Pure hypercholesterolemia 12/07/2020   Renal cyst 12/07/2020   Sleep disorder 04/08/2018   Status post bronchoscopy 04/12/2020   Tear of rotator cuff 04/09/2018   Tobacco user 72/03/4708    Uncomplicated alcohol dependence ([place]) 04/09/2018   Unspecified rotator cuff tear or rupture of unspecified shoulder, not specified as traumatic 12/07/2020   Unspecified tear of unspecified meniscus, current injury, unspecified knee, initial encounter 12/07/2020   Visual disturbance 12/07/2020   Vitamin D deficiency 04/08/2018    Tobacco History: Social History   Tobacco Use  Smoking Status Former   Packs/day: 0.75   Years: 67.00   Pack years: 50.25   Types: Cigarettes   Start date: 4   Quit date: 07/13/2020   Years since quitting: 1.4  Smokeless Tobacco Never  Tobacco Comments   Recent Quit     Counseling given: Not Answered Tobacco comments: Recent Quit     Outpatient Medications Prior to Visit  Medication Sig Dispense Refill   albuterol (PROVENTIL) (2.5 MG/3ML) 0.083% nebulizer solution Take 3 mLs (2.5 mg total) by nebulization every 6 (six) hours as needed for wheezing or shortness of breath. 150 mL 5   albuterol (VENTOLIN HFA) 108 (90 Base) MCG/ACT inhaler Inhale 2 puffs into the lungs 3 (three) times daily as needed for wheezing or shortness of breath.     aspirin EC 81 MG tablet Take 1 tablet (81 mg total) by mouth daily. Swallow whole. 90 tablet  3   Budeson-Glycopyrrol-Formoterol (BREZTRI AEROSPHERE) 160-9-4.8 MCG/ACT AERO Inhale 2 puffs into the lungs 2 (two) times daily. 10.7 g 11   cholecalciferol (VITAMIN D3) 25 MCG (1000 UNIT) tablet Take 1,000 Units by mouth 3 (three) times a week.      clonazePAM (KLONOPIN) 0.5 MG tablet Take 0.5 mg by mouth daily as needed for anxiety.     clopidogrel (PLAVIX) 75 MG tablet Take 1 tablet (75 mg total) by mouth daily. 90 tablet 2   gabapentin (NEURONTIN) 100 MG capsule Take 100 mg by mouth 2 (two) times daily.     Ipratropium-Albuterol (COMBIVENT RESPIMAT) 20-100 MCG/ACT AERS respimat Inhale 1-2 puffs into the lungs every 6 (six) hours as needed for wheezing or shortness of breath. 4 g 6   Melatonin 5 MG CAPS Take 5 mg by  mouth at bedtime.     nitroGLYCERIN (NITROSTAT) 0.4 MG SL tablet Place 1 tablet (0.4 mg total) under the tongue every 5 (five) minutes as needed for chest pain. 25 tablet 4   pantoprazole (PROTONIX) 40 MG tablet Take 40 mg by mouth daily.     predniSONE (DELTASONE) 10 MG tablet 2tabs x1 week then 1tab x1 week. 21 tablet 0   predniSONE (DELTASONE) 5 MG tablet Take 5 mg by mouth daily.     rosuvastatin (CRESTOR) 20 MG tablet Take 1 tablet (20 mg total) by mouth at bedtime. 90 tablet 2   sildenafil (VIAGRA) 100 MG tablet Take 100 mg by mouth as needed for erectile dysfunction.     tamsulosin (FLOMAX) 0.4 MG CAPS capsule TAKE 1 CAPSULE BY MOUTH EVERY DAY 90 capsule 1   valsartan (DIOVAN) 80 MG tablet Take 80 mg by mouth daily.     varenicline (CHANTIX) 1 MG tablet Take 1 mg by mouth daily.     azithromycin (ZITHROMAX) 250 MG tablet Take 1 tablet (250 mg total) by mouth as directed. (Patient not taking: Reported on 12/15/2021) 6 tablet 0   benzonatate (TESSALON) 200 MG capsule TAKE 1 CAPSULE (200 MG TOTAL) BY MOUTH 3 (THREE) TIMES DAILY AS NEEDED FOR COUGH. (Patient not taking: Reported on 12/15/2021) 45 capsule 1   No facility-administered medications prior to visit.     Review of Systems:   Constitutional:   No  weight loss, night sweats,  Fevers, chills, fatigue, or  lassitude.  HEENT:   No headaches,  Difficulty swallowing,  Tooth/dental problems, or  Sore throat,                No sneezing, itching, ear ache, nasal congestion, post nasal drip,   CV:  No chest pain,  Orthopnea, PND, swelling in lower extremities, anasarca, dizziness, palpitations, syncope.   GI  No heartburn, indigestion, abdominal pain, nausea, vomiting, diarrhea, change in bowel habits, loss of appetite, bloody stools.   Resp: No shortness of breath with exertion or at rest.  No excess mucus, no productive cough,  No non-productive cough,  No coughing up of blood.  No change in color of mucus.  No wheezing.  No chest wall  deformity  Skin: no rash or lesions.  GU: no dysuria, change in color of urine, no urgency or frequency.  No flank pain, no hematuria   MS:  No joint pain or swelling.  No decreased range of motion.  No back pain.    Physical Exam  BP 120/70 (BP Location: Right Arm, Patient Position: Sitting, Cuff Size: Normal)   Pulse 82   Temp 98 F (36.7 C) (Oral)  Ht '5\' 10"'$  (1.778 m)   Wt 156 lb 12.8 oz (71.1 kg)   SpO2 94%   BMI 22.50 kg/m   GEN: A/Ox3; pleasant , NAD, well nourished    HEENT:  [place]/AT,  EACs-clear, TMs-wnl, NOSE-clear, THROAT-clear, no lesions, no postnasal drip or exudate noted.   NECK:  Supple w/ fair ROM; no JVD; normal carotid impulses w/o bruits; no thyromegaly or nodules palpated; no lymphadenopathy.    RESP  Clear  P & A; w/o, wheezes/ rales/ or rhonchi. no accessory muscle use, no dullness to percussion  CARD:  RRR, no m/r/g, no peripheral edema, pulses intact, no cyanosis or clubbing.  GI:   Soft & nt; nml bowel sounds; no organomegaly or masses detected.   Musco: Warm bil, no deformities or joint swelling noted.   Neuro: alert, no focal deficits noted.    Skin: Warm, no lesions or rashes    Lab Results:  CBC    Component Value Date/Time   WBC 7.8 04/12/2020 0607   RBC 4.94 04/12/2020 0607   HGB 14.7 04/12/2020 0607   HGB 15.7 04/09/2018 1650   HCT 46.3 04/12/2020 0607   HCT 44.6 04/09/2018 1650   PLT 253 04/12/2020 0607   PLT 285 04/09/2018 1650   MCV 93.7 04/12/2020 0607   MCV 88 04/09/2018 1650   MCH 29.8 04/12/2020 0607   MCHC 31.7 04/12/2020 0607   RDW 13.6 04/12/2020 0607   RDW 13.2 04/09/2018 1650   LYMPHSABS 1.4 04/05/2020 0921   LYMPHSABS 1.4 04/09/2018 1650   MONOABS 0.6 04/05/2020 0921   EOSABS 0.1 04/05/2020 0921   EOSABS 0.2 04/09/2018 1650   BASOSABS 0.1 04/05/2020 0921   BASOSABS 0.1 04/09/2018 1650    BMET    Component Value Date/Time   NA 140 04/12/2020 0607   NA 142 06/06/2018 1012   K 3.7 04/12/2020 0607   CL  102 04/12/2020 0607   CO2 30 04/12/2020 0607   GLUCOSE 95 04/12/2020 0607   BUN 14 04/12/2020 0607   BUN 12 06/06/2018 1012   CREATININE 1.20 09/07/2021 0741   CALCIUM 9.0 04/12/2020 0607   GFRNONAA 58 (L) 04/12/2020 0607   GFRAA >60 04/12/2020 0607    BNP No results found for: BNP  ProBNP No results found for: PROBNP  Imaging: No results found.       Latest Ref Rng & Units 05/02/2018    2:45 PM  PFT Results  FVC-Pre L 2.50    FVC-Predicted Pre % 68    FVC-Post L 2.52    FVC-Predicted Post % 68    Pre FEV1/FVC % % 37    Post FEV1/FCV % % 40    FEV1-Pre L 0.92    FEV1-Predicted Pre % 35    FEV1-Post L 1.02    DLCO uncorrected ml/min/mmHg 11.04    DLCO UNC% % 38    DLCO corrected ml/min/mmHg 10.72    DLCO COR %Predicted % 37    DLVA Predicted % 45    TLC L 8.50    TLC % Predicted % 131    RV % Predicted % 213      No results found for: NITRICOXIDE      Assessment & Plan:   COPD (chronic obstructive pulmonary disease) (HCC) Severe COPD that is oxygen and steroid-dependent.  Patient appears to be at baseline.  He is encouraged to remain active.  Immunizations are up-to-date. He was given a prescription for antibiotics and steroids to have on hold while  traveling.  Patient given a action plan for COPD exacerbation.  Plan  Patient Instructions  Continue on BREZTRI 2 puffs Twice daily  , rinse after use.  Albuterol Inhaler or neb as needed.  Continue on Oxygen 3-4 l/m with activity and At bedtime  (pulsed oxygen )  Use Continuous flow Oxygen 2l/m At bedtime   CT chest in end of August 2023 -for lung nodules  Have a great time on your trip.  Antibiotic /steroid burst has been sent to your pharmacy if you have a flare while traveling (use sunscreen) , if not improving make sure you go to urgent care or ER while traveling .  Follow up with Dr. Lamonte Sakai  in 3-4 months and As needed          Pulmonary nodules Pulmonary nodularity noted on CT chest.  Patient  has upcoming CT chest August 2023.  Chronic respiratory failure with hypoxia (HCC) Continue on oxygen 2 L at rest and bedtime.  And 3 to 4 L with activity.  Goal is to maintain O2 saturations greater than 88 to 90%.     Rexene Edison, NP 12/15/2021

## 2021-12-15 NOTE — Assessment & Plan Note (Signed)
Severe COPD that is oxygen and steroid-dependent.  Patient appears to be at baseline.  He is encouraged to remain active.  Immunizations are up-to-date. He was given a prescription for antibiotics and steroids to have on hold while traveling.  Patient given a action plan for COPD exacerbation.  Plan  Patient Instructions  Continue on BREZTRI 2 puffs Twice daily  , rinse after use.  Albuterol Inhaler or neb as needed.  Continue on Oxygen 3-4 l/m with activity and At bedtime  (pulsed oxygen )  Use Continuous flow Oxygen 2l/m At bedtime   CT chest in end of August 2023 -for lung nodules  Have a great time on your trip.  Antibiotic /steroid burst has been sent to your pharmacy if you have a flare while traveling (use sunscreen) , if not improving make sure you go to urgent care or ER while traveling .  Follow up with Dr. Lamonte Sakai  in 3-4 months and As needed

## 2021-12-15 NOTE — Assessment & Plan Note (Signed)
Continue on oxygen 2 L at rest and bedtime.  And 3 to 4 L with activity.  Goal is to maintain O2 saturations greater than 88 to 90%.

## 2021-12-19 DIAGNOSIS — J439 Emphysema, unspecified: Secondary | ICD-10-CM | POA: Diagnosis not present

## 2021-12-19 DIAGNOSIS — I10 Essential (primary) hypertension: Secondary | ICD-10-CM | POA: Diagnosis not present

## 2021-12-19 DIAGNOSIS — I700 Atherosclerosis of aorta: Secondary | ICD-10-CM | POA: Diagnosis not present

## 2021-12-19 DIAGNOSIS — J9611 Chronic respiratory failure with hypoxia: Secondary | ICD-10-CM | POA: Diagnosis not present

## 2021-12-19 DIAGNOSIS — R0902 Hypoxemia: Secondary | ICD-10-CM | POA: Diagnosis not present

## 2021-12-19 DIAGNOSIS — I2510 Atherosclerotic heart disease of native coronary artery without angina pectoris: Secondary | ICD-10-CM | POA: Diagnosis not present

## 2021-12-19 DIAGNOSIS — K219 Gastro-esophageal reflux disease without esophagitis: Secondary | ICD-10-CM | POA: Diagnosis not present

## 2021-12-19 DIAGNOSIS — G4700 Insomnia, unspecified: Secondary | ICD-10-CM | POA: Diagnosis not present

## 2021-12-19 DIAGNOSIS — M2550 Pain in unspecified joint: Secondary | ICD-10-CM | POA: Diagnosis not present

## 2021-12-19 DIAGNOSIS — E559 Vitamin D deficiency, unspecified: Secondary | ICD-10-CM | POA: Diagnosis not present

## 2022-01-14 DIAGNOSIS — J9611 Chronic respiratory failure with hypoxia: Secondary | ICD-10-CM | POA: Diagnosis not present

## 2022-01-14 DIAGNOSIS — J449 Chronic obstructive pulmonary disease, unspecified: Secondary | ICD-10-CM | POA: Diagnosis not present

## 2022-01-15 ENCOUNTER — Telehealth: Payer: Self-pay | Admitting: *Deleted

## 2022-01-15 ENCOUNTER — Encounter: Payer: Self-pay | Admitting: Emergency Medicine

## 2022-01-15 MED ORDER — BREZTRI AEROSPHERE 160-9-4.8 MCG/ACT IN AERO: 2.0000 | INHALATION_SPRAY | Freq: Two times a day (BID) | RESPIRATORY_TRACT | 3 refills | Status: DC

## 2022-01-15 NOTE — Telephone Encounter (Signed)
Prescription for [facility] signed by Rexene Edison NP and faxed to [place]:  [phone].  Received a fax that it went through successfully.  Nothing further needed.

## 2022-02-01 DIAGNOSIS — M5030 Other cervical disc degeneration, unspecified cervical region: Secondary | ICD-10-CM | POA: Diagnosis not present

## 2022-02-01 DIAGNOSIS — M5136 Other intervertebral disc degeneration, lumbar region: Secondary | ICD-10-CM | POA: Diagnosis not present

## 2022-02-02 ENCOUNTER — Telehealth: Payer: Self-pay | Admitting: Emergency Medicine

## 2022-02-02 DIAGNOSIS — K580 Irritable bowel syndrome with diarrhea: Secondary | ICD-10-CM

## 2022-02-02 NOTE — Telephone Encounter (Signed)
Pt would like referral from [facility] to [facility]. States he is having serious issues and that PCP is on vacation.  Fax [phone]     Please advise.

## 2022-02-06 NOTE — Telephone Encounter (Signed)
Please let him know that I am happy to make the referral to [facility] in his PCPs absence.  He needs to let us know the specific problem he is experiencing so we can include this in the referral.

## 2022-02-07 NOTE — Telephone Encounter (Signed)
Called and spoke to patient and got him a referral sent into [facility]. Patient states he believes that he has IBS and is in constant pain. Referral sent. Patient verbalized understanding. Nothing further needed

## 2022-02-13 DIAGNOSIS — J449 Chronic obstructive pulmonary disease, unspecified: Secondary | ICD-10-CM | POA: Diagnosis not present

## 2022-02-13 DIAGNOSIS — J9611 Chronic respiratory failure with hypoxia: Secondary | ICD-10-CM | POA: Diagnosis not present

## 2022-02-17 ENCOUNTER — Encounter: Payer: Self-pay | Admitting: Emergency Medicine

## 2022-02-19 NOTE — Telephone Encounter (Signed)
PCCs please advise on GI referral. Thanks.

## 2022-02-21 ENCOUNTER — Encounter: Payer: Self-pay | Admitting: Gastroenterology

## 2022-02-21 DIAGNOSIS — B354 Tinea corporis: Secondary | ICD-10-CM | POA: Diagnosis not present

## 2022-02-21 DIAGNOSIS — L570 Actinic keratosis: Secondary | ICD-10-CM | POA: Diagnosis not present

## 2022-02-21 DIAGNOSIS — L821 Other seborrheic keratosis: Secondary | ICD-10-CM | POA: Diagnosis not present

## 2022-02-21 DIAGNOSIS — L299 Pruritus, unspecified: Secondary | ICD-10-CM | POA: Diagnosis not present

## 2022-02-21 DIAGNOSIS — R233 Spontaneous ecchymoses: Secondary | ICD-10-CM | POA: Diagnosis not present

## 2022-02-21 DIAGNOSIS — D1801 Hemangioma of skin and subcutaneous tissue: Secondary | ICD-10-CM | POA: Diagnosis not present

## 2022-02-21 DIAGNOSIS — L820 Inflamed seborrheic keratosis: Secondary | ICD-10-CM | POA: Diagnosis not present

## 2022-02-21 DIAGNOSIS — L853 Xerosis cutis: Secondary | ICD-10-CM | POA: Diagnosis not present

## 2022-02-21 NOTE — Telephone Encounter (Signed)
Referral was sent to [facility] on 7/12 - I just called and spoke to [place].  They are just behind - she states she will give a call to the pt now.  Will route back to triage to make them aware and so they can close message.

## 2022-02-27 ENCOUNTER — Ambulatory Visit: Payer: Medicare Other | Admitting: Gastroenterology

## 2022-03-07 ENCOUNTER — Ambulatory Visit
Admission: RE | Admit: 2022-03-07 | Discharge: 2022-03-07 | Disposition: A | Discharge status: Home / Self Care | Payer: Medicare Other | Source: Ambulatory Visit | Attending: Adult Health | Admitting: Adult Health

## 2022-03-07 DIAGNOSIS — R911 Solitary pulmonary nodule: Secondary | ICD-10-CM | POA: Diagnosis not present

## 2022-03-07 DIAGNOSIS — J439 Emphysema, unspecified: Secondary | ICD-10-CM | POA: Diagnosis not present

## 2022-03-07 DIAGNOSIS — R918 Other nonspecific abnormal finding of lung field: Secondary | ICD-10-CM

## 2022-03-08 ENCOUNTER — Encounter: Payer: Self-pay | Admitting: Emergency Medicine

## 2022-03-09 NOTE — Progress Notes (Signed)
ATC x1.  No answer.  LVM to return call.

## 2022-03-10 ENCOUNTER — Encounter: Payer: Self-pay | Admitting: Cardiology

## 2022-03-10 DIAGNOSIS — I771 Stricture of artery: Secondary | ICD-10-CM

## 2022-03-12 NOTE — Telephone Encounter (Signed)
Pt is on Rosuvastatin and taking it.

## 2022-03-15 ENCOUNTER — Other Ambulatory Visit: Payer: Self-pay

## 2022-03-15 DIAGNOSIS — R918 Other nonspecific abnormal finding of lung field: Secondary | ICD-10-CM

## 2022-03-16 DIAGNOSIS — J449 Chronic obstructive pulmonary disease, unspecified: Secondary | ICD-10-CM | POA: Diagnosis not present

## 2022-03-16 DIAGNOSIS — J9611 Chronic respiratory failure with hypoxia: Secondary | ICD-10-CM | POA: Diagnosis not present

## 2022-03-19 ENCOUNTER — Other Ambulatory Visit: Payer: Self-pay | Admitting: *Deleted

## 2022-03-19 DIAGNOSIS — R0989 Other specified symptoms and signs involving the circulatory and respiratory systems: Secondary | ICD-10-CM

## 2022-03-20 ENCOUNTER — Encounter: Payer: Self-pay | Admitting: Gastroenterology

## 2022-03-20 ENCOUNTER — Ambulatory Visit: Payer: Medicare Other | Admitting: Gastroenterology

## 2022-03-20 VITALS — BP 114/50 | HR 68 | Ht 70.5 in | Wt 163.0 lb

## 2022-03-20 DIAGNOSIS — K5900 Constipation, unspecified: Secondary | ICD-10-CM

## 2022-03-20 MED ORDER — POLYETHYLENE GLYCOL 3350 17 GM/SCOOP PO POWD: 1.0000 | Freq: Every day | ORAL | 3 refills | Status: DC

## 2022-03-20 NOTE — Patient Instructions (Addendum)
If you are age 81 or older, your body mass index should be between 23-30. Your Body mass index is 23.06 kg/m. If this is out of the aforementioned range listed, please consider follow up with your Primary Care Provider.  If you are age 27 or younger, your body mass index should be between 19-25. Your Body mass index is 23.06 kg/m. If this is out of the aformentioned range listed, please consider follow up with your Primary Care Provider.   Start Metamucil daily.  Follow up in two month if needed.  We have sent the following medications to your pharmacy for you to pick up at your convenience: Miralax 17g daily.   The [facility] providers would like to encourage you to use [facility] to communicate with providers for non-urgent requests or questions.  Due to long hold times on the telephone, sending your provider a message by [facility] may be a faster and more efficient way to get a response.  Please allow 48 business hours for a response.  Please remember that this is for non-urgent requests.   It was a pleasure to see you today!  Thank you for trusting me with your gastrointestinal care!    Scott E.Candis Schatz, MD

## 2022-03-20 NOTE — Progress Notes (Signed)
HPI : [name] [name] is a very pleasant 81 year old male with coronary artery disease and severe, oxygen dependent emphysema who is referred to [place] by Dr. Baltazar Apo for further evaluation and management of persistent constipation and pain with defecation.  Patient reports that his constipation started about 6 or 8 months ago.  He has been having bowel movement every 2 to 3 days, but reports significant difficulty with straining and difficulty evacuating stool.  He reports that his evacuation is improved by using manual pressure in the perianal area to help for stool out.  He does not insert his finger into the anus, he just presses the soft tissues around the anal opening.  His stool is formed and soft.  He denies hard stools.  Diarrhea is not a problem for him.  No problems with incontinence or leakage of stool.  He denies any blood in the stool.  His appetite is good and his weight has been increasing.  Abdominal pain is not a problem for him.  He does report pain with the passage of stool.  The pain is not severe, but it is uncomfortable.  He denies sensation of "passing glass". He has been taking an over-the-counter laxative which does provide good relief of his constipation.  He takes this once or twice a week.  He has not tried any daily laxative such as a fiber supplement or MiraLAX. The patient is certain he has had a colonoscopy, but he has no idea when it was or where it would have been performed. Patient's shortness of breath continues to be a significant issue for him, and he feels it is getting worse.  He gets very short of breath with minimal activity currently.   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   Chronic obstructive pulmonary disease with (acute) exacerbation ([place]) 12/07/2020   Chronic respiratory failure with hypoxia ([place]) 08/10/2019   Chronic sinusitis 12/07/2020   Cigarette nicotine dependence, uncomplicated 69/67/8938   Coagulation disorder ([place]) 01/13/2019   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   Contracture of palmar fascia 12/07/2020   COPD (chronic obstructive pulmonary disease) ([place])    Corn of toe 12/07/2020   Corns and callosities 04/09/2018   Coronary artery disease    2019 with stents   Coronary atherosclerosis 05/07/2018   Degenerative disc disease, 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   Degenerative disc disease, 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   Diarrhea 04/09/2018   Double vision with both eyes open 12/07/2020   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   Dysphagia 08/14/2018   Dyspnea    ED (erectile dysfunction)    ED (erectile dysfunction) of organic origin 12/07/2020   Elevated prostate specific antigen (PSA) 04/08/2018   Elevated PSA    Encounter for screening for other disorder 12/07/2020   Enlarged prostate without lower urinary tract symptoms (luts) 04/09/2018   Enthesopathy 12/07/2020   Essential (primary) hypertension 04/08/2018   Ex-smoker 04/09/2018  Exertional dyspnea 04/02/2018   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   Flatulence 04/08/2018   Gastro-esophageal reflux disease without esophagitis 04/08/2018   GERD (gastroesophageal reflux disease)    History of acute otitis externa 08/14/2018   Hyperlipidemia    Hypertension    Hypoxemia 12/07/2020   Kidney cysts    Left knee pain 12/07/2020   Low back pain 04/08/2018   Male erectile dysfunction 04/09/2018    Mixed hyperlipidemia 04/08/2018   Nocturia more than twice per night 04/09/2018   Osteoarthritis of first carpometacarpal joint 04/08/2018   Osteoarthritis of knee 12/07/2020   Other long term (current) drug therapy 12/07/2020   Pain due to onychomycosis of toenail of left foot 07/20/2019   Pain in joint 04/09/2018   Pain in knee 04/09/2018   Pain in unspecified knee 12/07/2020   Pain of finger 04/09/2018   Palpitations    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[place]) 12/07/2020   Personal history of colonic polyps 12/07/2020   Pneumonia    Pneumothorax 04/12/2020   Polypharmacy 04/09/2018   Porokeratosis 01/13/2019   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[place])    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[place]) 12/07/2020   Pulmonary nodules 06/08/2016   Pure hypercholesterolemia 12/07/2020   Renal cyst 12/07/2020   Sleep disorder 04/08/2018   Status post bronchoscopy 04/12/2020   Tear of rotator cuff 04/09/2018   Tobacco user 75/17/0017   Uncomplicated alcohol dependence ([place]) 04/09/2018   Unspecified rotator cuff tear or rupture of unspecified shoulder, not specified as traumatic 12/07/2020   Unspecified tear of unspecified meniscus, current injury, unspecified knee, initial encounter 12/07/2020   Visual disturbance 12/07/2020   Vitamin D deficiency 04/08/2018     Past Surgical History:  Procedure Laterality Date   BRONCHIAL BIOPSY  10/13/2019   Procedure: BRONCHIAL BIOPSIES;  Surgeon: Collene Gobble, MD;  Location: [place];  Service: Pulmonary;;   BRONCHIAL BIOPSY  04/12/2020   Procedure: BRONCHIAL BIOPSIES;  Surgeon: Collene Gobble, MD;  Location: [place];  Service: Pulmonary;;   BRONCHIAL BRUSHINGS  10/13/2019   Procedure: BRONCHIAL BRUSHINGS;  Surgeon: Collene Gobble, MD;  Location: [facility] ENDOSCOPY;  Service: Pulmonary;;   BRONCHIAL BRUSHINGS  04/12/2020   Procedure: BRONCHIAL BRUSHINGS;  Surgeon: Collene Gobble, MD;  Location: [facility] ENDOSCOPY;  Service: Pulmonary;;   BRONCHIAL NEEDLE ASPIRATION BIOPSY  10/13/2019    Procedure: BRONCHIAL NEEDLE ASPIRATION BIOPSIES;  Surgeon: Collene Gobble, MD;  Location: [facility] ENDOSCOPY;  Service: Pulmonary;;   BRONCHIAL NEEDLE ASPIRATION BIOPSY  04/12/2020   Procedure: BRONCHIAL NEEDLE ASPIRATION BIOPSIES;  Surgeon: Collene Gobble, MD;  Location: [facility] ENDOSCOPY;  Service: Pulmonary;;   BRONCHIAL WASHINGS  10/13/2019   Procedure: BRONCHIAL WASHINGS;  Surgeon: Collene Gobble, MD;  Location[facility] ENDOSCOPY;  Service: Pulmonary;;   BRONCHIAL WASHINGS  04/12/2020   Procedure: BRONCHIAL WASHINGS;  Surgeon: Collene Gobble, MD;  Location: [place] ENDOSCOPY;  Service: Pulmonary;;   CARDIAC CATHETERIZATION  2002   50% RCA   CATARACT EXTRACTION, BILATERAL     CORONARY STENT INTERVENTION N/A 04/15/2018   Procedure: CORONARY STENT INTERVENTION;  Surgeon: Jettie Booze, MD;  Location: [place] CV LAB;  Service: Cardiovascular;  Laterality: N/A;  om1   EYE SURGERY     KNEE ARTHROSCOPY     LEFT HEART CATH AND CORONARY ANGIOGRAPHY N/A 04/15/2018   Procedure: LEFT HEART CATH AND CORONARY ANGIOGRAPHY;  Surgeon: Jettie Booze, MD;  Location: [place] CV LAB;  Service: Cardiovascular;  Laterality: N/A;   MULTIPLE TOOTH EXTRACTIONS  TONSILLECTOMY     VIDEO BRONCHOSCOPY  04/12/2020   VIDEO BRONCHOSCOPY WITH ENDOBRONCHIAL NAVIGATION N/A 07/26/2016   Procedure: VIDEO BRONCHOSCOPY WITH ENDOBRONCHIAL NAVIGATION;  Surgeon: Collene Gobble, MD;  Location: [place];  Service: Thoracic;  Laterality: N/A;   VIDEO BRONCHOSCOPY WITH ENDOBRONCHIAL NAVIGATION N/A 10/13/2019   Procedure: [facility] AND VIDEO BRONCHOSCOPY WITH ENDOBRONCHIAL NAVIGATION;  Surgeon: Collene Gobble, MD;  Location: [place] ENDOSCOPY;  Service: Pulmonary;  Laterality: N/A;   VIDEO BRONCHOSCOPY WITH ENDOBRONCHIAL NAVIGATION N/A 04/12/2020   Procedure: VIDEO BRONCHOSCOPY WITH ENDOBRONCHIAL NAVIGATION;  Surgeon: Collene Gobble, MD;  Location: [place] ENDOSCOPY;  Service: Pulmonary;  Laterality: N/A;   Family History  Problem Relation Age  of Onset   Hypertension Mother    Alzheimer's disease Mother    Social History   Tobacco Use   Smoking status: Former    Packs/day: 0.75    Years: 67.00    Total pack years: 50.25    Types: Cigarettes    Start date: 62    Quit date: 07/13/2020    Years since quitting: 1.6   Smokeless tobacco: Never   Tobacco comments:    Recent Quit    Vaping Use   Vaping Use: Former  Substance Use Topics   Alcohol use: No    Alcohol/week: 0.0 standard drinks of alcohol    Comment: quit drinking 2014   Drug use: No   Current Outpatient Medications  Medication Sig Dispense Refill   albuterol (PROVENTIL) (2.5 MG/3ML) 0.083% nebulizer solution Take 3 mLs (2.5 mg total) by nebulization every 6 (six) hours as needed for wheezing or shortness of breath. 150 mL 5   albuterol (VENTOLIN HFA) 108 (90 Base) MCG/ACT inhaler Inhale 2 puffs into the lungs 3 (three) times daily as needed for wheezing or shortness of breath.     ammonium lactate (AMLACTIN) 12 % cream Apply topically at bedtime.     aspirin EC 81 MG tablet Take 1 tablet (81 mg total) by mouth daily. Swallow whole. 90 tablet 3   Budeson-Glycopyrrol-Formoterol (BREZTRI AEROSPHERE) 160-9-4.8 MCG/ACT AERO Inhale 2 puffs into the lungs 2 (two) times daily. 32.1 g 3   cholecalciferol (VITAMIN D3) 25 MCG (1000 UNIT) tablet Take 1,000 Units by mouth 3 (three) times a week.      clonazePAM (KLONOPIN) 0.5 MG tablet Take 0.5 mg by mouth daily as needed for anxiety.     clopidogrel (PLAVIX) 75 MG tablet Take 1 tablet (75 mg total) by mouth daily. 90 tablet 2   finasteride (PROSCAR) 5 MG tablet Take 5 mg by mouth daily.     gabapentin (NEURONTIN) 100 MG capsule Take 100 mg by mouth 2 (two) times daily.     Ipratropium-Albuterol (COMBIVENT RESPIMAT) 20-100 MCG/ACT AERS respimat Inhale 1-2 puffs into the lungs every 6 (six) hours as needed for wheezing or shortness of breath. 4 g 6   ketoconazole (NIZORAL) 2 % cream Apply topically 2 (two) times daily.      Melatonin 5 MG CAPS Take 5 mg by mouth at bedtime.     nitroGLYCERIN (NITROSTAT) 0.4 MG SL tablet Place 1 tablet (0.4 mg total) under the tongue every 5 (five) minutes as needed for chest pain. 25 tablet 4   OXYGEN Inhale into the lungs. 2-3 liters upon exertion and 2 liters continuous at night.     pantoprazole (PROTONIX) 40 MG tablet Take 40 mg by mouth daily.     predniSONE (DELTASONE) 5 MG tablet Take 5 mg by mouth daily.  rosuvastatin (CRESTOR) 20 MG tablet Take 1 tablet (20 mg total) by mouth at bedtime. 90 tablet 2   sildenafil (VIAGRA) 100 MG tablet Take 100 mg by mouth as needed for erectile dysfunction.     tamsulosin (FLOMAX) 0.4 MG CAPS capsule TAKE 1 CAPSULE BY MOUTH EVERY DAY 90 capsule 1   valsartan (DIOVAN) 80 MG tablet Take 80 mg by mouth daily.     varenicline (CHANTIX) 1 MG tablet Take 1 mg by mouth once a week.     No current facility-administered medications for this visit.   Allergies  Allergen Reactions   Flagyl [Metronidazole] Other (See Comments)    Other reaction(s): skin reaction     Review of Systems: All systems reviewed and negative except where noted in HPI.    CT Chest Wo Contrast  Result Date: 03/07/2022 CLINICAL DATA:  Follow-up pulmonary nodules. EXAM: CT CHEST WITHOUT CONTRAST TECHNIQUE: Multidetector CT imaging of the chest was performed following the standard protocol without IV contrast. RADIATION DOSE REDUCTION: This exam was performed according to the departmental dose-optimization program which includes automated exposure control, adjustment of the mA and/or kV according to patient size and/or use of iterative reconstruction technique. COMPARISON:  PA and lateral chest 08/28/2021, and chest CTs without contrast 03/04/2021 and 02/25/2020. FINDINGS: Cardiovascular: The cardiac size is normal. There is no pericardial effusion. There is three-vessel coronary artery calcification heaviest in the left-sided arteries. There are normal caliber pulmonary  arteries and veins. There is lipomatous hypertrophy of the interatrial septum as seen previously previously. Aortic and great vessel atherosclerosis is seen without aneurysm with heavily calcified and severely stenotic right subclavian artery origin and at least 50-60% calcific stenosis in the proximal left subclavian artery. Mediastinum/Nodes: No enlarged mediastinal or axillary lymph nodes. Thyroid gland, trachea, and esophagus demonstrate no significant findings. Lungs/Pleura: Advanced centrilobular/panlobular emphysematous disease. No pleural effusion, thickening or pneumothorax. Stable 6 mm rounded noncalcified right upper lobe nodule on 5:65, new as of 03/04/2021. Similar 1 cm posterior right upper lobe nodule on 5:79 is not significantly changed. Continued decreased size of 11 mm right lower lobe superior segment nodule on 5:84, previously was 1.4 cm. Just below this, 1.5 cm right lower lobe nodule with central lucency is stable measuring 1.5 cm, third nodule more medially at this area is 8.5 mm on 5:82, previously 1.1 cm. There is a stable 5 mm fissural nodule 5:90. Nearby left upper lobe nodules again measure 1.1 cm on 5:63 and 0.9 cm on 5:65, previously 1.1 cm and 1 cm respectively. Rounded left lower lobe nodule today is 7.6 mm on 5:66 and unchanged since the last CT but new as of 03/04/2021. There is a new 7 mm nodule in the base of the right middle lobe on 5:136. There is no confluent infiltrate or further new nodule. Coarse linear scar-like opacities again noted in both bases. Upper Abdomen: No acute abnormality. Musculoskeletal: No acute or significant osseous findings. Osteopenia, degenerative change in mild thoracic kyphodextroscoliosis are noted with multilevel spinal bridging enthesopathy. IMPRESSION: 1. 7 mm new nodule in the base of the right middle lobe. Six-month follow-up study recommended. 2. Right upper and left lower lobe nodules which were new as of the most recent CT are unchanged since  then. 3. Other nodules described above are stable to slightly decreased in size. 4. Severe emphysematous disease with no confluent infiltrates or other new findings. 5. Aortic and coronary artery atherosclerosis. 6. 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 Electronically Signed   By:  Telford Nab M.D.   On: 03/07/2022 23:41    Physical Exam: BP (!) 114/50   Pulse 68   Ht 5' 10.5" (1.791 m)   Wt 163 lb (73.9 kg)   BMI 23.06 kg/m  Constitutional: Pleasant,well-developed, Caucasian male in no acute distress. HEENT: Normocephalic and atraumatic. Conjunctivae are normal. No scleral icterus. Neck supple.  Cardiovascular: Normal rate, regular rhythm.  Pulmonary/chest: Effort normal and breath sounds normal. No wheezing, rales or rhonchi.  Patient noted to be extremely short of breath after he climbed off the exam table and got back in his chair.  He had taken off his oxygen prior to the physical examination. Abdominal: Soft, nondistended, nontender. Bowel sounds active throughout. There are no masses palpable. No hepatomegaly. Extremities: 2+ pitting edema bilaterally Rectal: No external hemorrhoid or anal fissure noted on exam.  Hyperpigmented skin noted.  Digital rectal exam notable for soft brown stool in the rectal vault.  Sphincter tone appeared normal no rectal mass appreciated. Neurological: Alert and oriented to person place and time. Skin: Skin is warm and dry. No rashes noted.  Confluent ecchymoses noted on bilateral distal upper extremities Psychiatric: Normal mood and affect. Behavior is normal.  CBC    Component Value Date/Time   WBC 7.8 04/12/2020 0607   RBC 4.94 04/12/2020 0607   HGB 14.7 04/12/2020 0607   HGB 15.7 04/09/2018 1650   HCT 46.3 04/12/2020 0607   HCT 44.6 04/09/2018 1650   PLT 253 04/12/2020 0607   PLT 285 04/09/2018 1650   MCV 93.7 04/12/2020 0607   MCV 88 04/09/2018 1650   MCH 29.8 04/12/2020 0607   MCHC 31.7 04/12/2020 0607   RDW 13.6  04/12/2020 0607   RDW 13.2 04/09/2018 1650   LYMPHSABS 1.4 04/05/2020 0921   LYMPHSABS 1.4 04/09/2018 1650   MONOABS 0.6 04/05/2020 0921   EOSABS 0.1 04/05/2020 0921   EOSABS 0.2 04/09/2018 1650   BASOSABS 0.1 04/05/2020 0921   BASOSABS 0.1 04/09/2018 1650    CMP     Component Value Date/Time   NA 140 04/12/2020 0607   NA 142 06/06/2018 1012   K 3.7 04/12/2020 0607   CL 102 04/12/2020 0607   CO2 30 04/12/2020 0607   GLUCOSE 95 04/12/2020 0607   BUN 14 04/12/2020 0607   BUN 12 06/06/2018 1012   CREATININE 1.20 09/07/2021 0741   CALCIUM 9.0 04/12/2020 0607   PROT 5.5 (L) 04/12/2020 0607   PROT 6.7 06/06/2018 1012   ALBUMIN 3.5 04/12/2020 0607   ALBUMIN 4.7 06/06/2018 1012   AST 18 04/12/2020 0607   ALT <5 04/12/2020 0607   ALKPHOS 58 04/12/2020 0607   BILITOT 0.5 04/12/2020 0607   BILITOT 0.4 06/06/2018 1012   GFRNONAA 58 (L) 04/12/2020 0607   GFRAA >60 04/12/2020 8657     ASSESSMENT AND PLAN: 81 year old male with severe, oxygen dependent emphysema with 6 months of constipation and pain with defecation.  Rectal exam negative for anal fissure, hemorrhoids or mass.  He has significant straining and reports improvement in his defecation with manual splinting.  This is suggestive of some pelvic floor dysfunction.  Although a colonoscopy would not be unreasonable given new constipation in an 81 year old, I think he is at significant risk for perioperative complications given his severe lung disease and coronary artery disease.  He does not have other concerning symptoms such as anemia, rectal bleeding or unintentional weight loss.  I believe the risks of the procedure likely outweigh the benefits at this time.  For now, I recommended that he start taking a daily fiber supplement such as Metamucil.  He will likely need additional laxative therapy, so I suggested MiraLAX as well.  He can titrate the MiraLAX dosing as needed to achieve a daily bowel movement.  He can continue to take  his over-the-counter stimulant laxative as needed as well.  Given that he most likely has some degree of pelvic floor dysfunction, we also discussed pelvic floor physical therapy.  He does not seem very enthusiastic about this.  Therefore, we will continue to manage his symptoms as best with medications.  I think his pain with defecation will improve with a more robust laxative regimen.  Constipation/dyschezia - Metamucil daily - MiraLAX, frequency titrated to achieve 1 daily bowel movement - Continue as needed stimulant laxative (presumably Dulcolax) - Consider pelvic floor physical therapy if no improvement in symptoms - Hold off on colonoscopy given lack of red flag symptoms and patient's significant cardiopulmonary comorbidities  Kaydi Kley E. Candis Schatz, MD [facility]  CC:  Collene Gobble, MD

## 2022-03-29 ENCOUNTER — Encounter: Payer: Self-pay | Admitting: Cardiology

## 2022-03-29 ENCOUNTER — Encounter: Payer: Self-pay | Admitting: Vascular Surgery

## 2022-03-29 ENCOUNTER — Ambulatory Visit (HOSPITAL_COMMUNITY)
Admission: RE | Admit: 2022-03-29 | Discharge: 2022-03-29 | Disposition: A | Discharge status: Home / Self Care | Payer: Medicare Other | Source: Ambulatory Visit | Attending: Vascular Surgery | Admitting: Vascular Surgery

## 2022-03-29 ENCOUNTER — Ambulatory Visit: Payer: Medicare Other | Admitting: Vascular Surgery

## 2022-03-29 VITALS — BP 128/75 | HR 78 | Temp 98.2°F | Resp 20 | Ht 70.5 in | Wt 162.0 lb

## 2022-03-29 DIAGNOSIS — R0989 Other specified symptoms and signs involving the circulatory and respiratory systems: Secondary | ICD-10-CM | POA: Diagnosis not present

## 2022-03-29 DIAGNOSIS — I2510 Atherosclerotic heart disease of native coronary artery without angina pectoris: Secondary | ICD-10-CM

## 2022-03-29 DIAGNOSIS — I771 Stricture of artery: Secondary | ICD-10-CM | POA: Diagnosis not present

## 2022-03-29 NOTE — Progress Notes (Signed)
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  He has had no problems with dizziness and I would not recommend any further work-up unless he developed worsening problems with dizziness or right arm symptoms.  The lesion is heavily calcified and would be associated with increased risk if he underwent angioplasty.  Given his severe COPD and coronary artery disease he would not be a good candidate for surgery.  Fortunately this is not causing him any problems.  I have simply instructed him to be sure that people take his blood pressure and his left arm as this will be more accurate.  I will see him back as needed.  REASON FOR CONSULT:    Subclavian stenosis.  The consult is requested by Dr. Geraldo Pitter  HPI:   [name] [name]. is a 81 y.o. male who I saw 5 years ago on 02/07/2017 with claudication.  At that time, he had evidence of bilateral superficial femoral artery occlusive disease.  We talked about the importance of a structured walking program and the importance of staying off tobacco.  He had recently quit back then.   Since I saw him last he is not really having any symptoms of claudication.  I think this is limited by his pulmonary status which prevents him from walking much.  He is on oxygen for his COPD.  He denies any problems with dizziness.  He denies any focal weakness or paresthesias.  He said no right upper extremity symptoms.  He tells me he smoked his last cigarette in December 2022.  He is tried to quit multiple times.  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   Chronic obstructive pulmonary disease with (acute) exacerbation ([place]) 12/07/2020   Chronic respiratory failure with hypoxia ([place]) 08/10/2019   Chronic sinusitis 12/07/2020   Cigarette nicotine dependence, uncomplicated 52/84/1324   Coagulation disorder ([place]) 01/13/2019   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   Contracture of palmar fascia 12/07/2020   COPD (chronic obstructive pulmonary disease) ([place])    Corn of toe 12/07/2020   Corns and callosities 04/09/2018   Coronary artery disease    2019 with stents   Coronary atherosclerosis 05/07/2018   Degenerative disc disease, 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   Degenerative disc disease, 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   Diarrhea 04/09/2018   Double vision with both eyes open 12/07/2020   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   Dysphagia 08/14/2018   Dyspnea    ED (erectile dysfunction)    ED (erectile dysfunction) of organic origin 12/07/2020   Elevated prostate specific antigen (PSA) 04/08/2018   Elevated PSA    Encounter for screening for other disorder 12/07/2020   Enlarged prostate without lower urinary tract symptoms (luts) 04/09/2018   Enthesopathy 12/07/2020   Essential (primary) hypertension 04/08/2018   Ex-smoker 04/09/2018   Exertional dyspnea 04/02/2018   Extrapyramidal  and movement disorder 04/09/2018   Flatulence 04/08/2018   Gastro-esophageal reflux disease without esophagitis 04/08/2018   GERD (gastroesophageal reflux disease)    History of acute otitis externa 08/14/2018   Hyperlipidemia    Hypertension    Hypoxemia 12/07/2020   Kidney cysts    Left knee pain 12/07/2020   Low back pain 04/08/2018   Male erectile dysfunction 04/09/2018   Mixed hyperlipidemia 04/08/2018    Nocturia more than twice per night 04/09/2018   Osteoarthritis of first carpometacarpal joint 04/08/2018   Osteoarthritis of knee 12/07/2020   Other long term (current) drug therapy 12/07/2020   Pain due to onychomycosis of toenail of left foot 07/20/2019   Pain in joint 04/09/2018   Pain in knee 04/09/2018   Pain in unspecified knee 12/07/2020   Pain of finger 04/09/2018   Palpitations    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[place]) 12/07/2020   Personal history of colonic polyps 12/07/2020   Pneumonia    Pneumothorax 04/12/2020   Polypharmacy 04/09/2018   Porokeratosis 01/13/2019   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[place])    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   Pulmonary nodules 06/08/2016   Pure hypercholesterolemia 12/07/2020   Renal cyst 12/07/2020   Sleep disorder 04/08/2018   Status post bronchoscopy 04/12/2020   Tear of rotator cuff 04/09/2018   Tobacco user 56/21/3086   Uncomplicated alcohol dependence ([place]) 04/09/2018   Unspecified rotator cuff tear or rupture of unspecified shoulder, not specified as traumatic 12/07/2020   Unspecified tear of unspecified meniscus, current injury, unspecified knee, initial encounter 12/07/2020   Visual disturbance 12/07/2020   Vitamin D deficiency 04/08/2018    Family History  Problem Relation Age of Onset   Hypertension Mother    Alzheimer's disease Mother     SOCIAL HISTORY: Social History   Tobacco Use   Smoking status: Former    Packs/day: 0.75    Years: 67.00    Total pack years: 50.25    Types: Cigarettes    Start date: 59    Quit date: 07/13/2020    Years since quitting: 1.7   Smokeless tobacco: Never   Tobacco comments:    Recent Quit    Substance Use Topics   Alcohol use: No    Alcohol/week: 0.0 standard drinks of alcohol    Comment: quit drinking 2014    Allergies  Allergen Reactions   Flagyl [Metronidazole] Other (See Comments)    Other reaction(s): skin reaction    Current Outpatient Medications  Medication Sig Dispense  Refill   albuterol (PROVENTIL) (2.5 MG/3ML) 0.083% nebulizer solution Take 3 mLs (2.5 mg total) by nebulization every 6 (six) hours as needed for wheezing or shortness of breath. 150 mL 5   albuterol (VENTOLIN HFA) 108 (90 Base) MCG/ACT inhaler Inhale 2 puffs into the lungs 3 (three) times daily as needed for wheezing or shortness of breath.     ammonium lactate (AMLACTIN) 12 % cream Apply topically at bedtime.     Budeson-Glycopyrrol-Formoterol (BREZTRI AEROSPHERE) 160-9-4.8 MCG/ACT AERO Inhale 2 puffs into the lungs 2 (two) times daily. 32.1 g 3   cholecalciferol (VITAMIN D3) 25 MCG (1000 UNIT) tablet Take 1,000 Units by mouth 3 (three) times a week.      clonazePAM (KLONOPIN) 0.5 MG tablet Take 0.5 mg by mouth daily as needed for anxiety.     clopidogrel (PLAVIX) 75 MG tablet Take 1 tablet (75 mg total) by mouth daily. 90 tablet 2   finasteride (PROSCAR) 5 MG tablet Take 5 mg by mouth daily.  gabapentin (NEURONTIN) 100 MG capsule Take 100 mg by mouth 2 (two) times daily.     Ipratropium-Albuterol (COMBIVENT RESPIMAT) 20-100 MCG/ACT AERS respimat Inhale 1-2 puffs into the lungs every 6 (six) hours as needed for wheezing or shortness of breath. 4 g 6   ketoconazole (NIZORAL) 2 % cream Apply topically 2 (two) times daily.     Melatonin 5 MG CAPS Take 5 mg by mouth at bedtime.     nitroGLYCERIN (NITROSTAT) 0.4 MG SL tablet Place 1 tablet (0.4 mg total) under the tongue every 5 (five) minutes as needed for chest pain. 25 tablet 4   OXYGEN Inhale into the lungs. 2-3 liters upon exertion and 2 liters continuous at night.     pantoprazole (PROTONIX) 40 MG tablet Take 40 mg by mouth daily.     polyethylene glycol powder (GLYCOLAX/MIRALAX) 17 GM/SCOOP powder Take 255 g by mouth daily. 510 g 3   predniSONE (DELTASONE) 5 MG tablet Take 5 mg by mouth daily.     rosuvastatin (CRESTOR) 20 MG tablet Take 1 tablet (20 mg total) by mouth at bedtime. 90 tablet 2   tamsulosin (FLOMAX) 0.4 MG CAPS capsule TAKE 1  CAPSULE BY MOUTH EVERY DAY 90 capsule 1   valsartan (DIOVAN) 80 MG tablet Take 80 mg by mouth daily.     varenicline (CHANTIX) 1 MG tablet Take 1 mg by mouth once a week.     No current facility-administered medications for this visit.    REVIEW OF SYSTEMS:  '[X]'$  denotes positive finding, '[ ]'$  denotes negative finding Cardiac  Comments:  Chest pain or chest pressure:    Shortness of breath upon exertion: x   Short of breath when lying flat:    Irregular heart rhythm:        Vascular    Pain in calf, thigh, or hip brought on by ambulation:    Pain in feet at night that wakes you up from your sleep:     Blood clot in your veins:    Leg swelling:  x       Pulmonary    Oxygen at home: x   Productive cough:  x   Wheezing:  x       Neurologic    Sudden weakness in arms or legs:     Sudden numbness in arms or legs:     Sudden onset of difficulty speaking or slurred speech:    Temporary loss of vision in one eye:     Problems with dizziness:         Gastrointestinal    Blood in stool:     Vomited blood:         Genitourinary    Burning when urinating:     Blood in urine:        Psychiatric    Major depression:         Hematologic    Bleeding problems:    Problems with blood clotting too easily:        Skin    Rashes or ulcers:        Constitutional    Fever or chills:    -  PHYSICAL EXAM:   Vitals:   03/29/22 1040 03/29/22 1041  BP: (!) 159/80 128/75  Pulse: 78   Resp: 20   Temp: 98.2 F (36.8 C)   SpO2: 97%   Weight: 162 lb (73.5 kg)   Height: 5' 10.5" (1.791 m)    Body mass index is 22.92  kg/m. GENERAL: The patient is a well-nourished male, in no acute distress. The vital signs are documented above. CARDIAC: There is a regular rate and rhythm.  VASCULAR: I do not detect carotid bruits. He has palpable femoral pulses. I cannot palpate pedal pulses however both feet appear adequately perfused. PULMONARY: There is good air exchange bilaterally without  wheezing or rales. ABDOMEN: Soft and non-tender with normal pitched bowel sounds.  MUSCULOSKELETAL: There are no major deformities. NEUROLOGIC: No focal weakness or paresthesias are detected. SKIN: There are no ulcers or rashes noted. PSYCHIATRIC: The patient has a normal affect.  DATA:    CAROTID DUPLEX: I have independently interpreted his carotid duplex scan today.  On the right side there is a less than 39% stenosis.  The right vertebral artery is patent with antegrade flow.  There is some stenosis in the right subclavian artery with a peak systolic velocity of 977 cm/s.  On the left side there is a less than 39% stenosis.  Left vertebral artery is patent with antegrade flow.  There is no significant left subclavian artery stenosis.  Deitra [facility]

## 2022-04-03 DIAGNOSIS — I771 Stricture of artery: Secondary | ICD-10-CM | POA: Diagnosis not present

## 2022-04-03 DIAGNOSIS — I2510 Atherosclerotic heart disease of native coronary artery without angina pectoris: Secondary | ICD-10-CM | POA: Diagnosis not present

## 2022-04-04 LAB — BASIC METABOLIC PANEL
BUN/Creatinine Ratio: 20 (ref 10–24)
BUN: 23 mg/dL (ref 8–27)
CO2: 22 mmol/L (ref 20–29)
Calcium: 9 mg/dL (ref 8.6–10.2)
Chloride: 101 mmol/L (ref 96–106)
Creatinine, Ser: 1.13 mg/dL (ref 0.76–1.27)
Glucose: 94 mg/dL (ref 70–99)
Potassium: 5.2 mmol/L (ref 3.5–5.2)
Sodium: 143 mmol/L (ref 134–144)
eGFR: 65 mL/min/{1.73_m2} (ref >59)

## 2022-04-04 LAB — LIPID PANEL
Chol/HDL Ratio: 1.6 ratio (ref 0.0–5.0)
Cholesterol, Total: 153 mg/dL (ref 100–199)
HDL: 96 mg/dL (ref >39)
LDL Chol Calc (NIH): 47 mg/dL (ref 0–99)
Triglycerides: 42 mg/dL (ref 0–149)
VLDL Cholesterol Cal: 10 mg/dL (ref 5–40)

## 2022-04-04 LAB — HEPATIC FUNCTION PANEL
ALT: 15 IU/L (ref 0–44)
AST: 25 IU/L (ref 0–40)
Albumin: 4.2 g/dL (ref 3.7–4.7)
Alkaline Phosphatase: 65 IU/L (ref 44–121)
Bilirubin Total: 0.4 mg/dL (ref 0.0–1.2)
Bilirubin, Direct: 0.11 mg/dL (ref 0.00–0.40)
Total Protein: 6.1 g/dL (ref 6.0–8.5)

## 2022-04-06 ENCOUNTER — Encounter: Payer: Self-pay | Admitting: Emergency Medicine

## 2022-04-06 NOTE — Telephone Encounter (Signed)
"  [facility] will be offering [place] the new COVID vaccination and the RSV vaccination. Should I get the RSV?"  Dr. Lamonte Sakai please advise.

## 2022-04-07 NOTE — Telephone Encounter (Signed)
Please let the patient know that I believe he would benefit from getting both vaccinations, covid booster and the RSV

## 2022-04-12 ENCOUNTER — Telehealth: Payer: Self-pay | Admitting: Emergency Medicine

## 2022-04-12 NOTE — Telephone Encounter (Signed)
Patient is returning a call regarding his CT results.  Please call patient back at this # [phone]

## 2022-04-16 DIAGNOSIS — J439 Emphysema, unspecified: Secondary | ICD-10-CM | POA: Diagnosis not present

## 2022-04-16 DIAGNOSIS — J449 Chronic obstructive pulmonary disease, unspecified: Secondary | ICD-10-CM | POA: Diagnosis not present

## 2022-04-16 DIAGNOSIS — R0609 Other forms of dyspnea: Secondary | ICD-10-CM | POA: Diagnosis not present

## 2022-04-16 DIAGNOSIS — R600 Localized edema: Secondary | ICD-10-CM | POA: Diagnosis not present

## 2022-04-16 DIAGNOSIS — J9611 Chronic respiratory failure with hypoxia: Secondary | ICD-10-CM | POA: Diagnosis not present

## 2022-04-16 NOTE — Telephone Encounter (Signed)
Already addressed. Nothing further needed  

## 2022-04-17 ENCOUNTER — Other Ambulatory Visit: Payer: Self-pay | Admitting: Internal Medicine

## 2022-04-17 ENCOUNTER — Ambulatory Visit
Admission: RE | Admit: 2022-04-17 | Discharge: 2022-04-17 | Disposition: A | Discharge status: Home / Self Care | Payer: Medicare Other | Source: Ambulatory Visit | Attending: Internal Medicine | Admitting: Internal Medicine

## 2022-04-17 DIAGNOSIS — R0609 Other forms of dyspnea: Secondary | ICD-10-CM

## 2022-04-17 DIAGNOSIS — J439 Emphysema, unspecified: Secondary | ICD-10-CM | POA: Diagnosis not present

## 2022-04-19 ENCOUNTER — Ambulatory Visit: Payer: Medicare Other | Admitting: Emergency Medicine

## 2022-04-19 ENCOUNTER — Encounter: Payer: Self-pay | Admitting: Emergency Medicine

## 2022-04-19 DIAGNOSIS — J431 Panlobular emphysema: Secondary | ICD-10-CM

## 2022-04-19 DIAGNOSIS — R0602 Shortness of breath: Secondary | ICD-10-CM

## 2022-04-19 DIAGNOSIS — R918 Other nonspecific abnormal finding of lung field: Secondary | ICD-10-CM

## 2022-04-19 MED ORDER — METHYLPREDNISOLONE ACETATE 80 MG/ML IJ SUSP
80.0000 mg | Freq: Once | INTRAMUSCULAR | Status: AC
  Administered 2022-04-19: 80 mg via INTRAMUSCULAR

## 2022-04-19 MED ORDER — PREDNISONE 10 MG PO TABS: ORAL_TABLET | ORAL | 0 refills | Status: DC

## 2022-04-19 NOTE — Addendum Note (Signed)
Addended by: Gavin Potters R on: 04/19/2022 03:37 PM   Modules accepted: Orders

## 2022-04-19 NOTE — Patient Instructions (Addendum)
Depo-Medrol injection today We will extend your prednisone taper.  Please complete the taper and then go back to usual 5 mg once daily. Continue your Breztri 2 puffs twice a day.  Rinse and gargle after using. Use Combivent as needed for shortness of breath Continue your oxygen at 2-4 L/min depending on your level of exertion. Agree with starting diuretics as planned by Dr. [name] We reviewed your CT scan of the chest.  We will plan to repeat your CT without contrast in 6 months which would be February 2024 Keep your flu shot, COVID-19 vaccine, RSV vaccine up-to-date. Follow with Dr Lamonte Sakai in 6 months or sooner if you have any problems

## 2022-04-19 NOTE — Progress Notes (Addendum)
Subjective:    Patient ID: [name] Kemps., male    DOB: [birth year], 82 y.o.   MRN: [id number]  COPD He complains of shortness of breath. There is no cough or wheezing. Pertinent negatives include no ear pain, fever, headaches, postnasal drip, rhinorrhea, sneezing, sore throat or trouble swallowing. His past medical history is significant for COPD.   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  He is undergone serial bronchoscopies with nodular biopsies, no evidence of malignancy but possibly some granulomatous inflammation.  He also has severe obstruction and associated chronic hypoxemic respiratory failure.  Question whether his nodular process may be steroid responsive.  HSP panel 1 year ago negative.  He is on chronic prednisone 5-10 mg for his obstructive disease.  Currently maintained on Breztri, oxygen at 2-4 L/min depending on his degree of activity.  He uses Combivent approximately 2-3 times daily He did pulm rehab and this was beneficial. He is trying to get to the fitness center 2-3x a week. Uses his O2 reliably. He has trouble taking a shower. He can do most things but slowly.   We discussed end-of-life issues today. He has DNR orders at his assisted living. Confirmed w him today.   CT scan of the chest performed 03/04/2021 reviewed by me, shows fairly advanced bullous emphysema, no mediastinal or hilar lymphadenopathy, decrease in size of the posterior right lower lobe nodule, near complete resolution of a 1.3 cm right lower lobe nodule, some stable nodules as well as some new foci including a 7 mm right upper lobe pulmonary nodule, 4 mm right middle lobe nodule.   ROV 04/19/22 --follow-up visit for 81 year old man with a history of severe obstructive lung disease and associated chronic hypoxemic respiratory failure.  I have followed him for pulmonary nodular disease that has prompted serial CT scans and bronchoscopies with  nodular biopsies.  We have never been able to establish malignancy but there has been some evidence of possible granulomatous inflammation.  He has had a negative HSP panel. He he is on oxygen at 2-4 L/min depending on his degree of activity.  Maintained on Breztri and uses Combivent as needed. He reports more exertional SOB over the last 3 weeks, but he has continued to be able to go to the gym. He is on prednisone '5mg'$  qd. Has experienced some increased wheeze as well.   Addendum added 04/24/22 >> more detail on the patient's sx: Coughing more frequently particularly this week. This followed by bouts of sneezing. Nose runs excessively in morning and again in evening usually following eating. Evidence of blood in left nostril when blowing nose. Mucus is becoming more of a factor. Difficulty in clearing. Tightness of chest when SOB. Legs, ankles, feet swell on daily basis. Some relief overnight in legs and ankles, but not feet. Skin bruises and cuts easily. Skin peels off. Excessive bleeding probably due to Plavix and Prednisone. Takes 2 + months for a leg cut to heal  CT chest performed 03/07/2022 reviewed by me shows waxing and waning pulmonary nodular disease.  There is been a decrease in size of right lower lobe superior segmental nodule now 11 mm, stable scattered other nodules.  There are new nodules present as well including a 6 mm rounded noncalcified right upper lobe nodule, 7 mm basilar right middle lobe nodule  Review of Systems  Constitutional:  Negative for fever and unexpected weight change.  HENT:  Negative for congestion, dental problem, ear pain, nosebleeds, postnasal drip, rhinorrhea, sinus pressure, sneezing, sore throat and trouble swallowing.   Eyes:  Negative for redness and itching.  Respiratory:  Positive for shortness of breath. Negative for cough, chest tightness and wheezing.   Cardiovascular:  Negative for palpitations and leg swelling.   Gastrointestinal:  Negative for nausea and vomiting.  Genitourinary:  Negative for dysuria.  Musculoskeletal:  Negative for joint swelling.  Skin:  Negative for rash.  Neurological:  Negative for headaches.  Hematological:  Does not bruise/bleed easily.  Psychiatric/Behavioral:  Negative for dysphoric mood. The patient is not nervous/anxious.        Objective:   Physical Exam Vitals:   04/19/22 1439 04/19/22 1442  BP: 136/78 118/70  Pulse: 88 89  Temp:  98.2 F (36.8 C)  TempSrc:  Oral  SpO2:  97%  Weight:  161 lb 6.4 oz (73.2 kg)  Height:  5' 10.5" (1.791 m)    Gen: Pleasant, well-nourished, in no distress,  normal affect  ENT: No lesions,  mouth clear,  oropharynx clear, no postnasal drip  Neck: No JVD, no upper airway noise  Lungs: No use of accessory muscles, distant, end expiratory wheeze especially on forced expiration  Cardiovascular: RRR, heart sounds normal, no murmur or gallops, 1+ bilateral ankle edema   Musculoskeletal: No deformities, no cyanosis or clubbing  Neuro: alert, non focal  Skin: Warm, no lesions or rash, multiple ecchymoses      Assessment & Plan:  Dyspnea Progressive dyspnea over the last 3 weeks.  He had wheezing when he saw his primary care physician recently, has been treated with azithromycin and prednisone 40 mg for 5 days.  He is not wheezing on exam today but certainly this could be consistent with an acute flare.  I will extend the prednisone taper, then get him back down to 5 mg daily.  Also note that he has lower extremity edema, now starting Lasix.  Volume overload or cardiac function could be impacting breathing as well.  COPD (chronic obstructive pulmonary disease) (H[facility]) Depo-Medrol injection today We will extend your prednisone taper.  Please complete the taper and then go back to usual 5 mg once daily. Continue your Breztri 2 puffs twice a day.  Rinse and gargle after using. Use Combivent as needed for shortness of  breath Keep your flu shot, COVID-19 vaccine, RSV vaccine up-to-date. Follow with Dr Lamonte Sakai in 6 months or sooner if you have any problems  Pulmonary nodules These have been waxing waning.  His biopsies have only shown granulomatous disease.  Nothing has ever grown out on cultures, his HSP panel has been negative.  As there were a few new nodules on the most recent scan I think we need to continue to scan regularly, next to be done in 6 months.    Baltazar Apo, MD, PhD 04/19/2022, 3:08 PM  Pulmonary and Critical Care [phone] or if no answer [phone]

## 2022-04-19 NOTE — Assessment & Plan Note (Signed)
These have been waxing waning.  His biopsies have only shown granulomatous disease.  Nothing has ever grown out on cultures, his HSP panel has been negative.  As there were a few new nodules on the most recent scan I think we need to continue to scan regularly, next to be done in 6 months.

## 2022-04-19 NOTE — Assessment & Plan Note (Signed)
Progressive dyspnea over the last 3 weeks.  He had wheezing when he saw his primary care physician recently, has been treated with azithromycin and prednisone 40 mg for 5 days.  He is not wheezing on exam today but certainly this could be consistent with an acute flare.  I will extend the prednisone taper, then get him back down to 5 mg daily.  Also note that he has lower extremity edema, now starting Lasix.  Volume overload or cardiac function could be impacting breathing as well.

## 2022-04-19 NOTE — Telephone Encounter (Signed)
Will forward to Dr. Lamonte Sakai as Juluis Rainier and advise further if needed.

## 2022-04-19 NOTE — Assessment & Plan Note (Signed)
Depo-Medrol injection today We will extend your prednisone taper.  Please complete the taper and then go back to usual 5 mg once daily. Continue your Breztri 2 puffs twice a day.  Rinse and gargle after using. Use Combivent as needed for shortness of breath Keep your flu shot, COVID-19 vaccine, RSV vaccine up-to-date. Follow with Dr Lamonte Sakai in 6 months or sooner if you have any problems

## 2022-04-19 NOTE — Addendum Note (Signed)
Addended by: Gavin Potters R on: 04/19/2022 03:29 PM   Modules accepted: Orders

## 2022-04-24 NOTE — Telephone Encounter (Signed)
Thank you I addended his office note to have this on record

## 2022-05-16 DIAGNOSIS — J449 Chronic obstructive pulmonary disease, unspecified: Secondary | ICD-10-CM | POA: Diagnosis not present

## 2022-05-16 DIAGNOSIS — J9611 Chronic respiratory failure with hypoxia: Secondary | ICD-10-CM | POA: Diagnosis not present

## 2022-05-17 ENCOUNTER — Other Ambulatory Visit: Payer: Self-pay | Admitting: Urology

## 2022-05-21 DIAGNOSIS — J441 Chronic obstructive pulmonary disease with (acute) exacerbation: Secondary | ICD-10-CM | POA: Diagnosis not present

## 2022-05-21 DIAGNOSIS — R208 Other disturbances of skin sensation: Secondary | ICD-10-CM | POA: Diagnosis not present

## 2022-05-23 DIAGNOSIS — L570 Actinic keratosis: Secondary | ICD-10-CM | POA: Diagnosis not present

## 2022-05-23 DIAGNOSIS — L578 Other skin changes due to chronic exposure to nonionizing radiation: Secondary | ICD-10-CM | POA: Diagnosis not present

## 2022-05-23 DIAGNOSIS — L210 Seborrhea capitis: Secondary | ICD-10-CM | POA: Diagnosis not present

## 2022-05-23 DIAGNOSIS — L820 Inflamed seborrheic keratosis: Secondary | ICD-10-CM | POA: Diagnosis not present

## 2022-05-25 ENCOUNTER — Telehealth: Payer: Self-pay | Admitting: Emergency Medicine

## 2022-05-25 NOTE — Telephone Encounter (Signed)
Left message for patient to call back  

## 2022-05-28 ENCOUNTER — Encounter: Payer: Self-pay | Admitting: Emergency Medicine

## 2022-05-28 NOTE — Telephone Encounter (Signed)
Routing to Dr. Lamonte Sakai as Juluis Rainier;  [name]  P [facility] (supporting Collene Gobble, MD) 2 hours ago (2:09 PM)    My primary care physician wanted me to pass this information on to you. I have had a very difficult time breathing these past two weeks. Very shallow and short breaths. Last Monday I went to my PCP office ([facility]). I received a steroid shot and put on a Z pack of Prednisone. This has not helped. A prescription for an antibiotic was called in to my pharmacy this morning and I am waiting for it to be filled.    You should also know that my PCP provider, Dr. Mertha Finders, has retired. I am going to change PCP's to the [facility] facility in [place]. Seems to make sense to have as many people on the same team as possible.  [name] [name]

## 2022-05-29 NOTE — Telephone Encounter (Signed)
Attempted to call pt to get an appt scheduled but unable to reach. Left message for him letting him know that we needed him to be seen for an appt.

## 2022-05-29 NOTE — Telephone Encounter (Signed)
If he is still having SOB after the steroids, may need to be seen in office. Would offer him an acute OV

## 2022-05-30 ENCOUNTER — Encounter: Payer: Self-pay | Admitting: Internal Medicine

## 2022-05-30 ENCOUNTER — Ambulatory Visit: Payer: Medicare Other | Admitting: Internal Medicine

## 2022-05-30 VITALS — BP 106/58 | HR 66 | Temp 98.0°F | Ht 70.5 in | Wt 161.0 lb

## 2022-05-30 DIAGNOSIS — J9611 Chronic respiratory failure with hypoxia: Secondary | ICD-10-CM

## 2022-05-30 DIAGNOSIS — J9612 Chronic respiratory failure with hypercapnia: Secondary | ICD-10-CM | POA: Diagnosis not present

## 2022-05-30 DIAGNOSIS — R0609 Other forms of dyspnea: Secondary | ICD-10-CM | POA: Diagnosis not present

## 2022-05-30 LAB — CBC WITH DIFFERENTIAL/PLATELET
Basophils Absolute: 0 10*3/uL (ref 0.0–0.1)
Basophils Relative: 0.1 % (ref 0.0–3.0)
Eosinophils Absolute: 0 10*3/uL (ref 0.0–0.7)
Eosinophils Relative: 0.1 % (ref 0.0–5.0)
HCT: 46.1 % (ref 39.0–52.0)
Hemoglobin: 15.3 g/dL (ref 13.0–17.0)
Lymphocytes Relative: 5.8 % — ABNORMAL LOW (ref 12.0–46.0)
Lymphs Abs: 0.8 10*3/uL (ref 0.7–4.0)
MCHC: 33.2 g/dL (ref 30.0–36.0)
MCV: 93.3 fl (ref 78.0–100.0)
Monocytes Absolute: 1 10*3/uL (ref 0.1–1.0)
Monocytes Relative: 6.9 % (ref 3.0–12.0)
Neutro Abs: 12.4 10*3/uL — ABNORMAL HIGH (ref 1.4–7.7)
Neutrophils Relative %: 87.1 % — ABNORMAL HIGH (ref 43.0–77.0)
Platelets: 332 10*3/uL (ref 150.0–400.0)
RBC: 4.94 Mil/uL (ref 4.22–5.81)
RDW: 14.3 % (ref 11.5–15.5)
WBC: 14.2 10*3/uL — ABNORMAL HIGH (ref 4.0–10.5)

## 2022-05-30 LAB — BASIC METABOLIC PANEL
BUN: 38 mg/dL — ABNORMAL HIGH (ref 6–23)
CO2: 33 mEq/L — ABNORMAL HIGH (ref 19–32)
Calcium: 9.1 mg/dL (ref 8.4–10.5)
Chloride: 102 mEq/L (ref 96–112)
Creatinine, Ser: 1.2 mg/dL (ref 0.40–1.50)
GFR: 56.84 mL/min — ABNORMAL LOW (ref >60.00)
Glucose, Bld: 87 mg/dL (ref 70–99)
Potassium: 5.1 mEq/L (ref 3.5–5.1)
Sodium: 142 mEq/L (ref 135–145)

## 2022-05-30 LAB — TSH: TSH: 0.48 u[IU]/mL (ref 0.35–5.50)

## 2022-05-30 LAB — BRAIN NATRIURETIC PEPTIDE: Pro B Natriuretic peptide (BNP): 34 pg/mL (ref 0.0–100.0)

## 2022-05-30 LAB — D-DIMER, QUANTITATIVE: D-Dimer, Quant: 0.29 mcg/mL FEU (ref <0.50)

## 2022-05-30 NOTE — Patient Instructions (Signed)
Plan A = Automatic = Always=    Breztri Take 2 puffs first thing in am and then another 2 puffs about 12 hours later and prednisone 10 mg daily    Work on inhaler technique:  relax and gently blow all the way out then take a nice smooth full deep breath back in, triggering the inhaler at same time you start breathing in.  Hold breath in for at least  5 seconds if you can. Blow out breztri  thru nose. Rinse and gargle with water when done.  If mouth or throat bother you at all,  try brushing teeth/gums/tongue with arm and hammer toothpaste/ make a slurry and gargle and spit out.     Plan B = Backup (to supplement plan A, not to replace it) Only use your albuterol inhaler as a rescue medication to be used if you can't catch your breath by resting or doing a relaxed purse lip breathing pattern.  - The less you use it, the better it will work when you need it. - Ok to use the inhaler up to 2 puffs  every 4 hours if you must but call for appointment if use goes up over your usual need - Don't leave home without it !!  (think of it like the spare tire for your car)   Plan C = Crisis (instead of Plan B but only if Plan B stops working) - only use your albuterol nebulizer if you first try Plan B and it fails to help > ok to use the nebulizer up to every 4 hours but if start needing it regularly call for immediate appointment   Plan D = Deltasone - use this if not happy with ABC  Double until better then back  Plan E = ER - go to ER or call 911 if all else fails    Make sure you check your oxygen saturation  AT  your highest level of activity (not after you stop)   to be sure it stays over 90% and adjust  02 flow upward to maintain this level if needed but remember to turn it back to previous settings when you stop (to conserve your supply).   Whenever resp symptoms flare,  prilosec Take 30- 60 min before your first and last meals of the day   Please remember to go to the lab department   for your  tests - we will call you with the results when they are available.

## 2022-05-30 NOTE — Progress Notes (Signed)
[name] [name]., male    DOB: [birth year]   MRN: [id number]   Brief patient profile:  81   yowm  quit smoking 06/2020  GOLD 3 pfts 05/02/18  with baseline bicycle pedaling 20 min on 3lpm but otherwise very sedentary on Breztri and prednisone 10 mg qd     History of Present Illness  05/30/2022  Pulmonary/ 1st office eval/[facility]  Chief Complaint  Patient presents with   Acute Visit    Increased DOE x 2 wks. He gets winded just standing up sometimes. He has been wheezing and is coughing with clear sputum. He is using his albuterol a few times per wk.   Dyspnea: worse than usual x 2 week despite depomerol /prednisone taper  and then zpak / assoc chest tightness typical of his flares  Cough: usual cough / no purulent  mucus Sleep: no problem  SABA use: has not tried neb  02  2lpm hs up to 4lpm with activity   No obvious day to day or daytime pattern/variability or assoc excess/ purulent sputum or mucus plugs or hemoptysis or cp or subjective wheeze or overt sinus or hb symptoms.   sleeping without nocturnal  or early am exacerbation  of respiratory  c/o's or need for noct saba. Also denies any obvious fluctuation of symptoms with weather or environmental changes or other aggravating or alleviating factors except as outlined above   No unusual exposure hx or h/o childhood pna/ asthma or knowledge of premature birth.  Current Allergies, Complete Past Medical History, Past Surgical History, Family History, and Social History were reviewed in [employer] record.  ROS  The following are not active complaints unless bolded Hoarseness, sore throat, dysphagia/globus?, dental problems, itching, sneezing,  nasal congestion or discharge of excess mucus or purulent secretions, ear ache,   fever, chills, sweats, unintended wt loss or wt gain, classically pleuritic or exertional cp,  orthopnea pnd or arm/hand swelling  or leg swelling, presyncope, palpitations, abdominal pain,  anorexia, nausea, vomiting, diarrhea  or change in bowel habits or change in bladder habits, change in stools or change in urine, dysuria, hematuria,  rash, arthralgias, visual complaints, headache, numbness, weakness or ataxia or problems with walking or coordination,  change in mood or  memory.           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   Contracture of palmar fascia 12/07/2020   COPD (chronic obstructive pulmonary disease) (HCC)    Corn of toe 12/07/2020   Corns and callosities 04/09/2018   Coronary artery disease    2019 with stents   Coronary atherosclerosis 05/07/2018  Degenerative disc disease, 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   Degenerative disc disease, 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   Diarrhea 04/09/2018   Double vision with both eyes open 12/07/2020   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   Dysphagia 08/14/2018   Dyspnea    ED (erectile dysfunction)    ED (erectile dysfunction) of organic origin 12/07/2020   Elevated prostate specific antigen (PSA) 04/08/2018   Elevated PSA    Encounter for screening for  other disorder 12/07/2020   Enlarged prostate without lower urinary tract symptoms (luts) 04/09/2018   Enthesopathy 12/07/2020   Essential (primary) hypertension 04/08/2018   Ex-smoker 04/09/2018   Exertional dyspnea 04/02/2018   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   Flatulence 04/08/2018   Gastro-esophageal reflux disease without esophagitis 04/08/2018   GERD (gastroesophageal reflux disease)    History of acute otitis externa 08/14/2018   Hyperlipidemia    Hypertension    Hypoxemia 12/07/2020   Kidney cysts    Left knee pain 12/07/2020   Low back pain 04/08/2018   Male erectile dysfunction 04/09/2018   Mixed hyperlipidemia 04/08/2018   Nocturia more than twice per night 04/09/2018   Osteoarthritis of first carpometacarpal joint 04/08/2018   Osteoarthritis of knee 12/07/2020   Other long term (current) drug therapy 12/07/2020   Pain due to onychomycosis of toenail of left foot 07/20/2019   Pain in joint 04/09/2018   Pain in knee 04/09/2018   Pain in unspecified knee 12/07/2020   Pain of finger 04/09/2018   Palpitations    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HCC) 12/07/2020   Personal history of colonic polyps 12/07/2020   Pneumonia    Pneumothorax 04/12/2020   Polypharmacy 04/09/2018   Porokeratosis 01/13/2019   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HCC)    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   Pulmonary nodules 06/08/2016   Pure hypercholesterolemia 12/07/2020   Renal cyst 12/07/2020   Sleep disorder 04/08/2018   Status post bronchoscopy 04/12/2020   Tear of rotator cuff 04/09/2018   Tobacco user 12/07/2020   Uncomplicated alcohol dependence (HCC) 04/09/2018   Unspecified rotator cuff tear or rupture of unspecified shoulder, not specified as traumatic 12/07/2020   Unspecified tear of unspecified meniscus, current injury, unspecified knee, initial encounter 12/07/2020   Visual disturbance 12/07/2020   Vitamin D deficiency 04/08/2018    Outpatient Medications Prior to Visit   Medication Sig Dispense Refill   albuterol (PROVENTIL) (2.5 MG/3ML) 0.083% nebulizer solution Take 3 mLs (2.5 mg total) by nebulization every 6 (six) hours as needed for wheezing or shortness of breath. 150 mL 5   albuterol (VENTOLIN HFA) 108 (90 Base) MCG/ACT inhaler Inhale 2 puffs into the lungs 3 (three) times daily as needed for wheezing or shortness of breath.     ammonium lactate (AMLACTIN) 12 % cream Apply topically at bedtime.     azithromycin (ZITHROMAX) 250 MG tablet zpack as directed     Budeson-Glycopyrrol-Formoterol (BREZTRI AEROSPHERE) 160-9-4.8 MCG/ACT AERO Inhale 2 puffs into the lungs 2 (two) times daily. 32.1 g 3   clonazePAM (KLONOPIN) 0.5 MG tablet Take 0.5 mg by mouth daily as needed for anxiety.     clopidogrel (PLAVIX) 75 MG tablet Take 1 tablet (75 mg total) by mouth daily. 90 tablet 2   finasteride (PROSCAR) 5 MG tablet Take 5 mg by mouth daily.     gabapentin (NEURONTIN) 100 MG capsule Take 100 mg by mouth 2 (two) times daily.     Ipratropium-Albuterol (COMBIVENT RESPIMAT) 20-100 MCG/ACT AERS respimat Inhale 1-2 puffs into the lungs every 6 (six) hours as needed for  wheezing or shortness of breath. 4 g 6   ketoconazole (NIZORAL) 2 % cream Apply topically 2 (two) times daily.     Melatonin 5 MG CAPS Take 5 mg by mouth at bedtime.     nitroGLYCERIN (NITROSTAT) 0.4 MG SL tablet Place 1 tablet (0.4 mg total) under the tongue every 5 (five) minutes as needed for chest pain. 25 tablet 4   OXYGEN Inhale into the lungs. 2-3 liters upon exertion and 2 liters continuous at night.     pantoprazole (PROTONIX) 40 MG tablet Take 40 mg by mouth daily.     polyethylene glycol powder (GLYCOLAX/MIRALAX) 17 GM/SCOOP powder Take 255 g by mouth daily. (Patient taking differently: Take 1 Container by mouth daily as needed.) 510 g 3   predniSONE (DELTASONE) 10 MG tablet Take 3 tabs X 3 days, 2 tabs x 3 days, 1 tab x 3 days 30 tablet 0   rosuvastatin (CRESTOR) 20 MG tablet Take 1 tablet (20 mg  total) by mouth at bedtime. 90 tablet 2   tamsulosin (FLOMAX) 0.4 MG CAPS capsule TAKE 1 CAPSULE BY MOUTH EVERY DAY 90 capsule 1   valsartan (DIOVAN) 80 MG tablet Take 80 mg by mouth daily.     predniSONE (DELTASONE) 5 MG tablet Take 5 mg by mouth daily. (Patient not taking: Reported on 05/30/2022)     cholecalciferol (VITAMIN D3) 25 MCG (1000 UNIT) tablet Take 1,000 Units by mouth 3 (three) times a week.      varenicline (CHANTIX) 1 MG tablet Take 1 mg by mouth once a week.     No facility-administered medications prior to visit.     Objective:     BP (!) 106/58 (BP Location: Left Arm, Cuff Size: Normal)   Pulse 66   Temp 98 F (36.7 C) (Oral)   Ht 5' 10.5" (1.791 m)   Wt 161 lb (73 kg)   SpO2 100% Comment: on RA  BMI 22.77 kg/m   SpO2: 100 % (on RA)  W/c bound elderly wm nad at rest  HEENT :  Oropharynx  clear   Nasal turbinates nl    NECK :  without JVD/Nodes/TM/ nl carotid upstrokes bilaterally   LUNGS: no acc muscle use,  Mod barrel  contour chest wall with bilateral  Distant pan exp  wheeze and  without cough on insp or exp maneuvers and mod  Hyperresonant  to  percussion bilaterally     CV:  RRR  no s3 or murmur or increase in P2, and trace ankle edema sym bilaterally   ABD:  soft and nontender with pos mid insp Hoover's  in the supine position. No bruits or organomegaly appreciated, bowel sounds nl  MS:   Ext warm without deformities or   obvious joint restrictions , calf tenderness, cyanosis or clubbing  SKIN: warm and dry without lesions    NEURO:  alert, approp, nl sensorium with  no motor or cerebellar deficits apparent.          I personally reviewed images and agree with radiology impression as follows:  CXR:   04/17/22  1. Emphysema with bibasilar scarring. 2. No acute airspace disease.  Labs ordered/ reviewed:      Chemistry      Component Value Date/Time   NA 142 05/30/2022 1106   NA 143 04/03/2022 0921   K 5.1 05/30/2022 1106   CL 102  05/30/2022 1106   CO2 33 (H) 05/30/2022 1106   BUN 38 (H) 05/30/2022 1106  BUN 23 04/03/2022 0921   CREATININE 1.20 05/30/2022 1106      Component Value Date/Time   CALCIUM 9.1 05/30/2022 1106   ALKPHOS 65 04/03/2022 0921   AST 25 04/03/2022 0921   ALT 15 04/03/2022 0921   BILITOT 0.4 04/03/2022 0921        Lab Results  Component Value Date   WBC 14.2 (H) 05/30/2022   HGB 15.3 05/30/2022   HCT 46.1 05/30/2022   MCV 93.3 05/30/2022   PLT 332.0 05/30/2022     Lab Results  Component Value Date   DDIMER 0.29 05/30/2022      Lab Results  Component Value Date   TSH 0.48 05/30/2022     Lab Results  Component Value Date   PROBNP 34.0 05/30/2022      Labs ordered 05/30/2022  :  allergy screen/ Eos 0.1  alpha one AT phenotype      Assessment   DOE (dyspnea on exertion) Quit smoking 2021 with GOLD 3 criteria for copd 05/02/18  - 05/30/2022  After extensive coaching inhaler device,  effectiveness =    75% (short Ti)   DDX of  difficult airways management almost all start with A and  include Adherence, Ace Inhibitors, Acid Reflux, Active Sinus Disease, Alpha 1 Antitripsin deficiency, Anxiety masquerading as Airways dz,  ABPA,  Allergy(esp in young), Aspiration (esp in elderly), Adverse effects of meds,  Active smoking or vaping, A bunch of PE's (a small clot burden can't cause this syndrome unless there is already severe underlying pulm or vascular dz with poor reserve) plus two Bs  = Bronchiectasis and Beta blocker use..and one C= CHF  Adherence is always the initial "prime suspect" and is a multilayered concern that requires a "trust but verify" approach in every patient - starting with knowing how to use medications, especially inhalers, correctly, keeping up with refills and understanding the fundamental difference between maintenance and prns vs those medications only taken for a very short course and then stopped and not refilled.  - see hfa teaching, return with all meds in  hand using a trust but verify approach to confirm accurate Medication  Reconciliation The principal here is that until we are certain that the  patients are doing what we've asked, it makes no sense to ask them to do more.   ? Allergy/asthma > pred 20 mg / day until better then 10 mg daily  Re saba: not really using them approp as prns Re SABA :  I spent extra time with pt today reviewing appropriate use of albuterol for prn use on exertion with the following points: 1) saba is for relief of sob that does not improve by walking a slower pace or resting but rather if the pt does not improve after trying this first. 2) If the pt is convinced, as many are, that saba helps recover from activity faster then it's easy to tell if this is the case by re-challenging : ie stop, take the inhaler, then p 5 minutes try the exact same activity (intensity of workload) that just caused the symptoms and see if they are substantially diminished or not after saba 3) if there is an activity that reproducibly causes the symptoms, try the saba 15 min before the activity on alternate days   If in fact the saba really does help, then fine to continue to use it prn but advised may need to look closer at the maintenance regimen being used to achieve better control of airways  disease with exertion.   ? Acid (or non-acid) GERD > always difficult to exclude as up to 75% of pts in some series report no assoc GI/ Heartburn symptoms>  max rx with bid ppi when resp symptoms flare and observe dietary restrictions as well/ reviewed.  ? Alpha on AT def > send phenotype   ? A bunch of pe's D dimer nl - while a normal  or high normal value (seen commonly in the elderly or chronically ill)  may miss small peripheral pe, the clot burden with sob is moderately high and the d dimer  has a very high neg pred value if used in this setting.    ? CHF  > bnp less than 100 rules against   Chronic respiratory failure with hypoxia and hypercapnia  (HCC) HC03  05/30/2022   = 33   Doing fine at rest RA but advised: Make sure you check your oxygen saturation  AT  your highest level of activity (not after you stop)   to be sure it stays over 90% and adjust  02 flow upward to maintain this level if needed but remember to turn it back to previous settings when you stop (to conserve your supply).   I had an extended discussion with the patient and reviewed all relevant studies  with moderate level of MDM for acute pt not previously known by me.  Each maintenance medication was reviewed in detail including most importantly the difference between maintenance and prns and under what circumstances the prns are to be triggered using an action plan format that is not reflected in the computer generated alphabetically organized AVS.     Please see AVS for specific instructions unique to this visit that I personally wrote and verbalized to the the pt in detail and then reviewed with pt  by my nurse highlighting any  changes in therapy recommended at today's visit to their plan of care.       Sandrea Hughs, MD 05/30/2022

## 2022-05-30 NOTE — Telephone Encounter (Signed)
Patient had an OV with Dr. Melvyn Novas today.  Nothing further needed.

## 2022-05-31 ENCOUNTER — Encounter: Payer: Self-pay | Admitting: Internal Medicine

## 2022-05-31 DIAGNOSIS — J9611 Chronic respiratory failure with hypoxia: Secondary | ICD-10-CM | POA: Insufficient documentation

## 2022-05-31 NOTE — Assessment & Plan Note (Addendum)
HC03  05/30/2022   = 33   Doing fine at rest RA but advised: Make sure you check your oxygen saturation  AT  your highest level of activity (not after you stop)   to be sure it stays over 90% and adjust  02 flow upward to maintain this level if needed but remember to turn it back to previous settings when you stop (to conserve your supply).   I had an extended discussion with the patient and reviewed all relevant studies  with moderate level of MDM for acute pt not previously known by me.  Each maintenance medication was reviewed in detail including most importantly the difference between maintenance and prns and under what circumstances the prns are to be triggered using an action plan format that is not reflected in the computer generated alphabetically organized AVS.     Please see AVS for specific instructions unique to this visit that I personally wrote and verbalized to the the pt in detail and then reviewed with pt  by my nurse highlighting any  changes in therapy recommended at today's visit to their plan of care.

## 2022-05-31 NOTE — Assessment & Plan Note (Addendum)
Quit smoking 2021 with GOLD 3 criteria for copd 05/02/18  - 05/30/2022  After extensive coaching inhaler device,  effectiveness =    75% (short Ti)   DDX of  difficult airways management almost all start with A and  include Adherence, Ace Inhibitors, Acid Reflux, Active Sinus Disease, Alpha 1 Antitripsin deficiency, Anxiety masquerading as Airways dz,  ABPA,  Allergy(esp in young), Aspiration (esp in elderly), Adverse effects of meds,  Active smoking or vaping, A bunch of PE's (a small clot burden can't cause this syndrome unless there is already severe underlying pulm or vascular dz with poor reserve) plus two Bs  = Bronchiectasis and Beta blocker use..and one C= CHF  Adherence is always the initial "prime suspect" and is a multilayered concern that requires a "trust but verify" approach in every patient - starting with knowing how to use medications, especially inhalers, correctly, keeping up with refills and understanding the fundamental difference between maintenance and prns vs those medications only taken for a very short course and then stopped and not refilled.  - see hfa teaching, return with all meds in hand using a trust but verify approach to confirm accurate Medication  Reconciliation The principal here is that until we are certain that the  patients are doing what we've asked, it makes no sense to ask them to do more.   ? Allergy/asthma > pred 20 mg / day until better then 10 mg daily  Re saba: not really using them approp as prns Re SABA :  I spent extra time with pt today reviewing appropriate use of albuterol for prn use on exertion with the following points: 1) saba is for relief of sob that does not improve by walking a slower pace or resting but rather if the pt does not improve after trying this first. 2) If the pt is convinced, as many are, that saba helps recover from activity faster then it's easy to tell if this is the case by re-challenging : ie stop, take the inhaler, then p 5  minutes try the exact same activity (intensity of workload) that just caused the symptoms and see if they are substantially diminished or not after saba 3) if there is an activity that reproducibly causes the symptoms, try the saba 15 min before the activity on alternate days   If in fact the saba really does help, then fine to continue to use it prn but advised may need to look closer at the maintenance regimen being used to achieve better control of airways disease with exertion.   ? Acid (or non-acid) GERD > always difficult to exclude as up to 75% of pts in some series report no assoc GI/ Heartburn symptoms>  max rx with bid ppi when resp symptoms flare and observe dietary restrictions as well/ reviewed.  ? Alpha on AT def > send phenotype   ? A bunch of pe's D dimer nl - while a normal  or high normal value (seen commonly in the elderly or chronically ill)  may miss small peripheral pe, the clot burden with sob is moderately high and the d dimer  has a very high neg pred value if used in this setting.    ? CHF  > bnp less than 100 rules against

## 2022-06-01 ENCOUNTER — Other Ambulatory Visit: Payer: Self-pay

## 2022-06-01 MED ORDER — CLOPIDOGREL BISULFATE 75 MG PO TABS: 75.0000 mg | ORAL_TABLET | Freq: Every day | ORAL | 0 refills | Status: DC

## 2022-06-04 ENCOUNTER — Encounter: Payer: Self-pay | Admitting: Internal Medicine

## 2022-06-06 LAB — ALPHA-1-ANTITRYPSIN PHENOTYP: A-1 Antitrypsin: 113 mg/dL (ref 101–187)

## 2022-06-13 DIAGNOSIS — J441 Chronic obstructive pulmonary disease with (acute) exacerbation: Secondary | ICD-10-CM | POA: Diagnosis not present

## 2022-06-13 DIAGNOSIS — J439 Emphysema, unspecified: Secondary | ICD-10-CM | POA: Diagnosis not present

## 2022-06-16 DIAGNOSIS — J449 Chronic obstructive pulmonary disease, unspecified: Secondary | ICD-10-CM | POA: Diagnosis not present

## 2022-06-16 DIAGNOSIS — J9611 Chronic respiratory failure with hypoxia: Secondary | ICD-10-CM | POA: Diagnosis not present

## 2022-06-18 ENCOUNTER — Ambulatory Visit (INDEPENDENT_AMBULATORY_CARE_PROVIDER_SITE_OTHER): Payer: Medicare Other

## 2022-06-18 ENCOUNTER — Ambulatory Visit: Payer: Medicare Other | Admitting: Adult Health

## 2022-06-18 ENCOUNTER — Encounter: Payer: Self-pay | Admitting: Adult Health

## 2022-06-18 VITALS — BP 120/70 | HR 106 | Temp 97.6°F | Ht 70.5 in | Wt 161.0 lb

## 2022-06-18 DIAGNOSIS — J441 Chronic obstructive pulmonary disease with (acute) exacerbation: Secondary | ICD-10-CM | POA: Diagnosis not present

## 2022-06-18 DIAGNOSIS — J9611 Chronic respiratory failure with hypoxia: Secondary | ICD-10-CM | POA: Diagnosis not present

## 2022-06-18 DIAGNOSIS — R911 Solitary pulmonary nodule: Secondary | ICD-10-CM | POA: Diagnosis not present

## 2022-06-18 MED ORDER — ALBUTEROL SULFATE (2.5 MG/3ML) 0.083% IN NEBU: 2.5000 mg | INHALATION_SOLUTION | Freq: Four times a day (QID) | RESPIRATORY_TRACT | 5 refills | Status: DC | PRN

## 2022-06-18 MED ORDER — AMOXICILLIN-POT CLAVULANATE 875-125 MG PO TABS: 1.0000 | ORAL_TABLET | Freq: Two times a day (BID) | ORAL | 0 refills | Status: DC

## 2022-06-18 NOTE — Progress Notes (Signed)
$'@Patient'Q$  ID: [name] Kemps., male    DOB: 07-Feb-1941, 81 y.o.   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  Patient is on chronic steroids with prednisone 5 mg daily Has underwent serial bronchoscopies due to pulmonary nodularity.  No evidence of malignancy but possible underlying granulomatous inflammation.  It has been steroid responsive.  HSP panel was negative  TEST/EVENTS :  CT chest 02/26/2020 reviewed, showed multiple bilateral irregular peribronchovascular nodules in postinflammatory infectious pattern   CT chest March 04, 2021 dominant pulmonary nodules are decreased in size.  Interval new nodules are noted.  Maximum size 7 mm (plan repeat 02/2022)    PFTs October 2019 showed severe COPD with FEV1 at 38%, ratio 40, FVC 68%, no significant bronchodilator response, mid flow reversibility, DLCO 38%.  CT chest 03/07/22-CT chest shows advanced emphysema. New nodule in the right upper lobe measuring 6 mm and small 7 mm right middle lobe nodule Stable right upper lobe nodule. Decreased size of right lower lobe nodule. Stable left upper lobe nodules.  06/18/2022 Follow up: COPD, oxygen dependent respiratory failure and pulmonary nodular disease Patient returns for a 3-week follow-up.  Patient complains of ongoing cough, congestion , increased dyspnea , and decreased activity tolerance that has been going on for 3-4 weeks.  Patient received a prednisone burst to 20 mg daily for 1 week.  Patient says he did not have any improvement in symptoms.  And last week symptoms got worse. Seen by PCP last week , started on Doxycyline and steroid burst.  He is on day 3 out of 7. Patient remains on Breztri inhaler twice daily.  Remains on oxygen 3 L at rest and 4 L with activity. Home covid test  negative. Patient says he is eating well.  He is now taking Lasix 20 mg daily.  Has had decreased leg swelling.  He denies any hemoptysis, chest pain, orthopnea, nausea vomiting diarrhea. Says he was able to go to the gym 3 days ago and do some light exercises.  As above patient has known waxing and waning pulmonary nodules.  Biopsies have been negative for malignant cells, showing granulomatous changes.  Most recent CT scan in August showed decreased size in right lower lobe nodule.  Stable left upper lobe nodule.  And new nodule in the right upper and right middle lobe.  Patient has a planned CT chest in 6 months.   Allergies  Allergen Reactions   Flagyl [Metronidazole] Other (See Comments)    Other reaction(s): skin reaction    Immunization History  Administered Date(s) Administered   Fluad Quad(high Dose 65+) 04/13/2020, 03/29/2021, 05/18/2022   Influenza Split 04/19/2009, 04/26/2010, 05/14/2011, 05/06/2015, 02/28/2019, 04/13/2020   Influenza, High Dose Seasonal PF 04/02/2018, 03/11/2019, 04/13/2020   Influenza,inj,Quad PF,6+ Mos 03/30/2016   PFIZER(Purple Top)SARS-COV-2 Vaccination 08/13/2019, 09/01/2019, 05/24/2020, 09/13/2020   Pneumococcal Conjugate-13 04/29/2014   Pneumococcal Polysaccharide-23 09/12/2005, 05/01/2013   Zoster, Live 04/13/2008    Past Medical History:  Diagnosis Date   Abdominal discomfort 12/07/2020   Acquired trigger finger of right middle finger 02/04/2020   Acute prostatitis 04/08/2018   Alcohol abuse    Alcohol dependence ([place]) 12/07/2020   Anal or rectal pain 04/08/2018   Annual physical exam 12/07/2020   Anorectal disorder 12/07/2020   Arthritis    Atherosclerotic heart disease of  native coronary artery without angina pectoris 04/08/2018   Benign prostatic hyperplasia with lower urinary tract symptoms 12/07/2020   BPH with elevated PSA    Cancer ([place])    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   Chronic obstructive pulmonary disease with (acute)  exacerbation (HCC) 12/07/2020   Chronic respiratory failure with hypoxia (HCC) 08/10/2019   Chronic sinusitis 12/07/2020   Cigarette nicotine dependence, uncomplicated 67/89/3810   Coagulation disorder ([place]) 01/13/2019   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   Contracture of palmar fascia 12/07/2020   COPD (chronic obstructive pulmonary disease) ([place])    Corn of toe 12/07/2020   Corns and callosities 04/09/2018   Coronary artery disease    2019 with stents   Coronary atherosclerosis 05/07/2018   Degenerative disc disease, 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   Degenerative disc disease, 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   Diarrhea 04/09/2018   Double vision with both eyes open 12/07/2020   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   Dysphagia 08/14/2018   Dyspnea    ED (erectile dysfunction)    ED (erectile dysfunction) of organic origin 12/07/2020   Elevated prostate specific antigen (PSA) 04/08/2018   Elevated PSA    Encounter for screening for other disorder 12/07/2020   Enlarged prostate without lower urinary tract symptoms (luts) 04/09/2018   Enthesopathy 12/07/2020   Essential (primary) hypertension 04/08/2018   Ex-smoker 04/09/2018   Exertional dyspnea 04/02/2018   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   Flatulence 04/08/2018   Gastro-esophageal reflux disease without esophagitis 04/08/2018   GERD (gastroesophageal reflux disease)    History of acute otitis externa 08/14/2018   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[place]) 12/07/2020   Personal history of colonic polyps 12/07/2020   Pneumonia    Pneumothorax 04/12/2020   Polypharmacy 04/09/2018   Porokeratosis 01/13/2019   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[place])    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[place]) 12/07/2020   Pulmonary nodules 06/08/2016   Pure hypercholesterolemia 12/07/2020   Renal cyst 12/07/2020   Sleep disorder 04/08/2018   Status post bronchoscopy 04/12/2020   Tear of rotator cuff 04/09/2018   Tobacco user 17/51/0258   Uncomplicated alcohol dependence ([place]) 04/09/2018   Unspecified rotator cuff tear or rupture of unspecified shoulder, not specified as traumatic 12/07/2020   Unspecified tear of unspecified meniscus, current injury, unspecified knee, initial encounter 12/07/2020   Visual disturbance 12/07/2020   Vitamin D deficiency 04/08/2018    Tobacco History: Social History   Tobacco Use  Smoking Status Former   Packs/day: 0.75   Years: 67.00   Total pack years: 50.25   Types: Cigarettes   Start date: 60   Quit date: 07/13/2020   Years since quitting: 1.9  Smokeless Tobacco Never  Tobacco Comments   Recent Quit     Counseling given: Not Answered Tobacco comments: Recent Quit     Outpatient Medications Prior to  Visit  Medication Sig Dispense Refill   albuterol (VENTOLIN HFA) 108 (90 Base) MCG/ACT inhaler Inhale 2 puffs into the lungs 3 (three) times daily as needed for wheezing or shortness of breath.     Budeson-Glycopyrrol-Formoterol (BREZTRI AEROSPHERE) 160-9-4.8 MCG/ACT AERO Inhale 2 puffs into the lungs 2 (two) times daily. 32.1 g 3   clonazePAM (KLONOPIN) 0.5 MG tablet Take 0.5 mg by mouth daily as needed for anxiety.     clopidogrel (PLAVIX) 75 MG tablet Take 1 tablet (75 mg total) by mouth daily. 90 tablet 0   doxycycline (VIBRAMYCIN) 100 MG capsule Take 100 mg by mouth 2 (two) times  daily.     finasteride (PROSCAR) 5 MG tablet Take 5 mg by mouth daily.     furosemide (LASIX) 20 MG tablet Take 20 mg by mouth daily.     gabapentin (NEURONTIN) 100 MG capsule Take 100 mg by mouth 2 (two) times daily.     ketoconazole (NIZORAL) 2 % cream Apply topically 2 (two) times daily.     Melatonin 5 MG CAPS Take 5 mg by mouth at bedtime.     nitroGLYCERIN (NITROSTAT) 0.4 MG SL tablet Place 1 tablet (0.4 mg total) under the tongue every 5 (five) minutes as needed for chest pain. 25 tablet 4   OXYGEN Inhale into the lungs. 2-3 liters upon exertion and 2 liters continuous at night.     pantoprazole (PROTONIX) 40 MG tablet Take 40 mg by mouth daily.     polyethylene glycol powder (GLYCOLAX/MIRALAX) 17 GM/SCOOP powder Take 255 g by mouth daily. (Patient taking differently: Take 1 Container by mouth daily as needed.) 510 g 3   predniSONE (DELTASONE) 10 MG tablet Take 3 tabs X 3 days, 2 tabs x 3 days, 1 tab x 3 days 30 tablet 0   rosuvastatin (CRESTOR) 20 MG tablet Take 1 tablet (20 mg total) by mouth at bedtime. 90 tablet 2   tamsulosin (FLOMAX) 0.4 MG CAPS capsule TAKE 1 CAPSULE BY MOUTH EVERY DAY 90 capsule 1   valsartan (DIOVAN) 80 MG tablet Take 80 mg by mouth daily.     albuterol (PROVENTIL) (2.5 MG/3ML) 0.083% nebulizer solution Take 3 mLs (2.5 mg total) by nebulization every 6 (six) hours as needed for wheezing or shortness of breath. 150 mL 5   ammonium lactate (AMLACTIN) 12 % cream Apply topically at bedtime. (Patient not taking: Reported on 06/18/2022)     azithromycin (ZITHROMAX) 250 MG tablet zpack as directed (Patient not taking: Reported on 06/18/2022)     Ipratropium-Albuterol (COMBIVENT RESPIMAT) 20-100 MCG/ACT AERS respimat Inhale 1-2 puffs into the lungs every 6 (six) hours as needed for wheezing or shortness of breath. (Patient not taking: Reported on 06/18/2022) 4 g 6   predniSONE (DELTASONE) 5 MG tablet Take 5 mg by mouth daily. (Patient not taking: Reported on 05/30/2022)      No facility-administered medications prior to visit.     Review of Systems:   Constitutional:   No  weight loss, night sweats,  Fevers, chills, + fatigue, or  lassitude.  HEENT:   No headaches,  Difficulty swallowing,  Tooth/dental problems, or  Sore throat,                No sneezing, itching, ear ache, nasal congestion, post nasal drip,   CV:  No chest pain,  Orthopnea, PND, swelling in lower extremities, anasarca, dizziness, palpitations, syncope.   GI  No heartburn, indigestion, abdominal pain, nausea, vomiting, diarrhea, change in bowel habits,  loss of appetite, bloody stools.   Resp: .  No chest wall deformity  Skin: no rash or lesions.  GU: no dysuria, change in color of urine, no urgency or frequency.  No flank pain, no hematuria   MS:  No joint pain or swelling.  No decreased range of motion.  No back pain.    Physical Exam  BP 120/70 (BP Location: Right Arm, Patient Position: Sitting, Cuff Size: Normal)   Pulse (!) 106   Temp 97.6 F (36.4 C) (Oral)   Ht 5' 10.5" (1.791 m)   Wt 161 lb (73 kg)   SpO2 99%   BMI 22.77 kg/m   GEN: A/Ox3; pleasant , NAD, well nourished , elderly, frail in wheelchair   HEENT:  [place]/AT,   NOSE-clear, THROAT-clear, no lesions, no postnasal drip or exudate noted.   NECK:  Supple w/ fair ROM; no JVD; normal carotid impulses w/o bruits; no thyromegaly or nodules palpated; no lymphadenopathy.    RESP scattered rhonchi bilaterally speaks in full sentences  no accessory muscle use, no dullness to percussion  CARD:  RRR, no m/r/g, tr peripheral edema, pulses intact, no cyanosis or clubbing.  GI:   Soft & nt; nml bowel sounds; no organomegaly or masses detected.   Musco: Warm bil, no deformities or joint swelling noted.   Neuro: alert, no focal deficits noted.    Skin: Warm, no lesions or rashes    Lab Results:  CBC    Component Value Date/Time   WBC 14.2 (H) 05/30/2022 1106   RBC 4.94 05/30/2022 1106   HGB 15.3 05/30/2022  1106   HGB 15.7 04/09/2018 1650   HCT 46.1 05/30/2022 1106   HCT 44.6 04/09/2018 1650   PLT 332.0 05/30/2022 1106   PLT 285 04/09/2018 1650   MCV 93.3 05/30/2022 1106   MCV 88 04/09/2018 1650   MCH 29.8 04/12/2020 0607   MCHC 33.2 05/30/2022 1106   RDW 14.3 05/30/2022 1106   RDW 13.2 04/09/2018 1650   LYMPHSABS 0.8 05/30/2022 1106   LYMPHSABS 1.4 04/09/2018 1650   MONOABS 1.0 05/30/2022 1106   EOSABS 0.0 05/30/2022 1106   EOSABS 0.2 04/09/2018 1650   BASOSABS 0.0 05/30/2022 1106   BASOSABS 0.1 04/09/2018 1650    BMET    Component Value Date/Time   NA 142 05/30/2022 1106   NA 143 04/03/2022 0921   K 5.1 05/30/2022 1106   CL 102 05/30/2022 1106   CO2 33 (H) 05/30/2022 1106   GLUCOSE 87 05/30/2022 1106   BUN 38 (H) 05/30/2022 1106   BUN 23 04/03/2022 0921   CREATININE 1.20 05/30/2022 1106   CALCIUM 9.1 05/30/2022 1106   GFRNONAA 58 (L) 04/12/2020 0607   GFRAA >60 04/12/2020 0607    BNP No results found for: "BNP"  ProBNP    Component Value Date/Time   PROBNP 34.0 05/30/2022 1106    Imaging: DG Chest 2 View  Result Date: 06/18/2022 CLINICAL DATA:  COPD exacerbation. EXAM: CHEST - 2 VIEW COMPARISON:  04/17/2022 FINDINGS: Lungs are hyperexpanded. Interstitial markings are diffusely coarsened with chronic features. No focal consolidation. No pleural effusion. The cardiopericardial silhouette is within normal limits for size. The visualized bony structures of the thorax are unremarkable. IMPRESSION: Hyperexpansion consistent with emphysema. No focal airspace consolidation. Multiple pulmonary nodule seen on chest CT 03/07/2022 not readily evident by x-ray. Electronically Signed   By: Misty Stanley M.D.   On: 06/18/2022 11:27    methylPREDNISolone acetate (DEPO-MEDROL) injection 80 mg  Date Action Dose Route User   04/19/2022 1519 Given 80 mg Intramuscular (Left Ventrogluteal) Dierdre Highman, RN          Latest Ref Rng & Units 05/02/2018    2:45 PM  PFT Results   FVC-Pre L 2.50   FVC-Predicted Pre % 68   FVC-Post L 2.52   FVC-Predicted Post % 68   Pre FEV1/FVC % % 37   Post FEV1/FCV % % 40   FEV1-Pre L 0.92   FEV1-Predicted Pre % 35   FEV1-Post L 1.02   DLCO uncorrected ml/min/mmHg 11.04   DLCO UNC% % 38   DLCO corrected ml/min/mmHg 10.72   DLCO COR %Predicted % 37   DLVA Predicted % 45   TLC L 8.50   TLC % Predicted % 131   RV % Predicted % 213     No results found for: "NITRICOXIDE"      Assessment & Plan:   Chronic obstructive pulmonary disease with (acute) exacerbation (HCC) Slow to resolve COPD exacerbation.  Chest x-ray today shows COPD changes without acute process We discussed hospitalization patient declines at this time.  We will treat with additional empiric antibiotics and continue with steroid taper.  Add in flutter valve for mucociliary clearance.  Maximize nebulized bronchodilators, mucolytic's and triple therapy maintenance inhaler.  Will need close follow-up.  Patient and wife advised if symptoms are not improving or worsen they will need to seek emergency room care and consideration for hospital admission as he is high risk for decompensation.  They verbalized understanding  Plan Patient Instructions  Finish doxycycline as directed Add Augmentin 875 mg twice daily for 7 days. Mucinex DM twice daily as needed for cough and congestion Add Flutter valve Three times a day   Taper prednisone as directed down to 10 mg and hold at this dose Continue on BREZTRI 2 puffs Twice daily  , rinse after use.  Albuterol Inhaler or neb as needed.  Continue on Oxygen 3-4 l/m with activity and At bedtime  Use Continuous flow Oxygen 2l/m At bedtime   Follow up with Dr. Lamonte Sakai or Joshuajames Moehring NP in 1 week and As needed   Please contact office for sooner follow up if symptoms do not improve or worsen or seek emergency care        Chronic respiratory failure with hypoxia ([place]) Continue on oxygen to maintain O2 saturations greater than  88 to 90%     Rexene Edison, NP 06/18/2022

## 2022-06-18 NOTE — Patient Instructions (Addendum)
Finish doxycycline as directed Add Augmentin 875 mg twice daily for 7 days. Mucinex DM twice daily as needed for cough and congestion Add Flutter valve Three times a day   Taper prednisone as directed down to 10 mg and hold at this dose Continue on BREZTRI 2 puffs Twice daily  , rinse after use.  Albuterol Inhaler or neb as needed.  Continue on Oxygen 3-4 l/m with activity and At bedtime  Use Continuous flow Oxygen 2l/m At bedtime   Follow up with Dr. Lamonte Sakai or Kathryne Ramella NP in 1 week and As needed   Please contact office for sooner follow up if symptoms do not improve or worsen or seek emergency care

## 2022-06-18 NOTE — Assessment & Plan Note (Signed)
Continue on oxygen to maintain O2 saturations greater than 88 to 90%. 

## 2022-06-18 NOTE — Assessment & Plan Note (Addendum)
Slow to resolve COPD exacerbation.  Chest x-ray today shows COPD changes without acute process We discussed hospitalization patient declines at this time.  We will treat with additional empiric antibiotics and continue with steroid taper.  Add in flutter valve for mucociliary clearance.  Maximize nebulized bronchodilators, mucolytic's and triple therapy maintenance inhaler.  Will need close follow-up.  Patient and wife advised if symptoms are not improving or worsen they will need to seek emergency room care and consideration for hospital admission as he is high risk for decompensation.  They verbalized understanding  Plan Patient Instructions  Finish doxycycline as directed Add Augmentin 875 mg twice daily for 7 days. Mucinex DM twice daily as needed for cough and congestion Add Flutter valve Three times a day   Taper prednisone as directed down to 10 mg and hold at this dose Continue on BREZTRI 2 puffs Twice daily  , rinse after use.  Albuterol Inhaler or neb as needed.  Continue on Oxygen 3-4 l/m with activity and At bedtime  Use Continuous flow Oxygen 2l/m At bedtime   Follow up with Dr. Lamonte Sakai or Delberta Folts NP in 1 week and As needed   Please contact office for sooner follow up if symptoms do not improve or worsen or seek emergency care

## 2022-06-20 DIAGNOSIS — L570 Actinic keratosis: Secondary | ICD-10-CM | POA: Diagnosis not present

## 2022-06-20 DIAGNOSIS — L578 Other skin changes due to chronic exposure to nonionizing radiation: Secondary | ICD-10-CM | POA: Diagnosis not present

## 2022-06-20 DIAGNOSIS — L814 Other melanin hyperpigmentation: Secondary | ICD-10-CM | POA: Diagnosis not present

## 2022-06-22 NOTE — Telephone Encounter (Signed)
Not recommended right now. Doxycycline and Augmentin should work if he has a bacterial infection. CXR did not show evidence of pneumonia. Recommend he continue Augmentin until complete, can extend if needed next week if needed. Per Tammy's note prednisone did not help. He should see Tammy or Dr. Lamonte Sakai first available if not getting better may need additional testing. Cont breztri  twice daily and use albuterol nebulizer every 4-6 hours for shortness of breth/wheezing. Take mucinex '600mg'$ -120-mg twice daily to loosen congestion.

## 2022-06-22 NOTE — Telephone Encounter (Signed)
Pt has seen beth prior even though he has mainly seen either Dr. Lamonte Sakai or Tammy. Sending this to [facility] to see if she is okay prescribing something different for pt.  Pt said that he has finished the doxy that was prescribed and is almost finished with the augmentin but states that he is still not feeling any better and wants to know if something else could be called in for him with the weekend coming up.

## 2022-06-25 ENCOUNTER — Ambulatory Visit: Payer: Medicare Other | Admitting: Adult Health

## 2022-06-25 ENCOUNTER — Encounter: Payer: Self-pay | Admitting: Adult Health

## 2022-06-25 VITALS — BP 100/60 | HR 70 | Temp 98.1°F | Ht 70.5 in | Wt 161.0 lb

## 2022-06-25 DIAGNOSIS — J441 Chronic obstructive pulmonary disease with (acute) exacerbation: Secondary | ICD-10-CM | POA: Diagnosis not present

## 2022-06-25 DIAGNOSIS — J9611 Chronic respiratory failure with hypoxia: Secondary | ICD-10-CM

## 2022-06-25 NOTE — Assessment & Plan Note (Signed)
Resolving COPD flare.  Patient is clinically stable and seems to have slight improvement of symptoms.  No further antibiotics.  Chest x-ray without acute process.  Patient has a decreased pulmonary reserve.  Advised to advance activity as tolerated.  Recent lab work and chest x-ray are reassuring. We will taper down slowly to 10 mg and hold at this dose until follow-up in 2 weeks.  Plan  Patient Instructions  Mucinex DM twice daily as needed for cough and congestion Add Flutter valve Three times a day   Taper prednisone as directed down to 10 mg and hold at this dose (Prednisone '20mg'$  daily for 3 days then '10mg'$  daily )  Continue on BREZTRI 2 puffs Twice daily  , rinse after use.  Albuterol Inhaler or neb as needed.  Continue on Oxygen 3-4 l/m with activity and At bedtime  Use Continuous flow Oxygen 2l/m At bedtime   Follow up with Dr. Lamonte Sakai or Yoshiaki Kreuser NP in 2 week and As needed   Please contact office for sooner follow up if symptoms do not improve or worsen or seek emergency care

## 2022-06-25 NOTE — Patient Instructions (Addendum)
Mucinex DM twice daily as needed for cough and congestion Add Flutter valve Three times a day   Taper prednisone as directed down to 10 mg and hold at this dose (Prednisone '20mg'$  daily for 3 days then '10mg'$  daily )  Continue on BREZTRI 2 puffs Twice daily  , rinse after use.  Albuterol Inhaler or neb as needed.  Continue on Oxygen 3-4 l/m with activity and At bedtime  Use Continuous flow Oxygen 2l/m At bedtime   Follow up with Dr. Lamonte Sakai or Daphnie Venturini NP in 2 week and As needed   Please contact office for sooner follow up if symptoms do not improve or worsen or seek emergency care

## 2022-06-25 NOTE — Assessment & Plan Note (Signed)
Continue on oxygen to maintain O2 saturations greater than 88 to 90%. 

## 2022-06-25 NOTE — Progress Notes (Signed)
$'@Patient'c$  ID: [name] Kemps., male    DOB: Nov 07, 1940, 81 y.o.   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  Patient is on chronic steroids with baseline prednisone at 5 mg daily Has underwent serial bronchoscopies due to pulmonary nodularity.  No evidence of malignancy but possible underlying granulomatous inflammation.  HSP panel was negative  TEST/EVENTS :  CT chest 02/26/2020 reviewed, showed multiple bilateral irregular peribronchovascular nodules in postinflammatory infectious pattern   CT chest March 04, 2021 dominant pulmonary nodules are decreased in size.  Interval new nodules are noted.  Maximum size 7 mm (plan repeat 02/2022)    PFTs October 2019 showed severe COPD with FEV1 at 38%, ratio 40, FVC 68%, no significant bronchodilator response, mid flow reversibility, DLCO 38%.   CT chest 03/07/22-CT chest shows advanced emphysema. New nodule in the right upper lobe measuring 6 mm and small 7 mm right middle lobe nodule Stable right upper lobe nodule. Decreased size of right lower lobe nodule. Stable left upper lobe nodules.  06/25/2022 Follow up : COPD , O2 RF  Patient presents for a 1 week follow-up. Seen last office visit for a slow to resolve COPD flare.  Patient was treated with additional empiric antibiotics.  Patient had completed doxycycline.  Started on Augmentin x7 days.  Prednisone burst.  With a slow prednisone taper.  Chest x-ray showed no acute process.  Patient returns today and states that he is slightly better but continues to feel very weak.  Gets short of breath with minimal activity.  Previous lab work showed a normal BNP and D-dimer.  White blood cell count was elevated consistent with a COPD exacerbation and prednisone burst.  Patient is eating well.  No nausea  vomiting or diarrhea.  No increased oxygen demands.  Patient unfortunately did not decrease prednisone as directed.  He remains on 30 mg of prednisone.  We had a long discussion regarding prednisone and potential side effects.    Allergies  Allergen Reactions   Flagyl [Metronidazole] Other (See Comments)    Other reaction(s): skin reaction    Immunization History  Administered Date(s) Administered   Fluad Quad(high Dose 65+) 04/13/2020, 03/29/2021, 05/18/2022   Influenza Split 04/19/2009, 04/26/2010, 05/14/2011, 05/06/2015, 02/28/2019, 04/13/2020   Influenza, High Dose Seasonal PF 04/02/2018, 03/11/2019, 04/13/2020   Influenza,inj,Quad PF,6+ Mos 03/30/2016   PFIZER(Purple Top)SARS-COV-2 Vaccination 08/13/2019, 09/01/2019, 05/24/2020, 09/13/2020   Pneumococcal Conjugate-13 04/29/2014   Pneumococcal Polysaccharide-23 09/12/2005, 05/01/2013   Zoster, Live 04/13/2008    Past Medical History:  Diagnosis Date   Abdominal discomfort 12/07/2020   Acquired trigger finger of right middle finger 02/04/2020   Acute prostatitis 04/08/2018   Alcohol abuse    Alcohol dependence ([place]) 12/07/2020   Anal or rectal pain 04/08/2018   Annual physical exam 12/07/2020   Anorectal disorder 12/07/2020   Arthritis    Atherosclerotic heart disease of native coronary artery without angina pectoris 04/08/2018   Benign prostatic hyperplasia with lower urinary tract symptoms 12/07/2020   BPH with elevated PSA    Cancer ([place])    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   Chronic obstructive pulmonary disease with (acute) exacerbation ([place]) 12/07/2020   Chronic respiratory failure with hypoxia ([place]) 08/10/2019   Chronic sinusitis 12/07/2020   Cigarette nicotine dependence, uncomplicated 35/59/7416   Coagulation disorder ([place]) 01/13/2019  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   Contracture of palmar fascia 12/07/2020   COPD (chronic obstructive pulmonary disease) (HCC)     Corn of toe 12/07/2020   Corns and callosities 04/09/2018   Coronary artery disease    2019 with stents   Coronary atherosclerosis 05/07/2018   Degenerative disc disease, 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   Degenerative disc disease, 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   Diarrhea 04/09/2018   Double vision with both eyes open 12/07/2020   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   Dysphagia 08/14/2018   Dyspnea    ED (erectile dysfunction)    ED (erectile dysfunction) of organic origin 12/07/2020   Elevated prostate specific antigen (PSA) 04/08/2018   Elevated PSA    Encounter for screening for other disorder 12/07/2020   Enlarged prostate without lower urinary tract symptoms (luts) 04/09/2018   Enthesopathy 12/07/2020   Essential (primary) hypertension 04/08/2018   Ex-smoker 04/09/2018   Exertional dyspnea 04/02/2018   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   Flatulence 04/08/2018   Gastro-esophageal reflux disease without esophagitis 04/08/2018   GERD (gastroesophageal reflux disease)    History of acute otitis externa 08/14/2018   Hyperlipidemia    Hypertension    Hypoxemia 12/07/2020   Kidney cysts    Left knee pain 12/07/2020   Low back pain 04/08/2018   Male erectile dysfunction 04/09/2018   Mixed hyperlipidemia 04/08/2018   Nocturia more than twice per night 04/09/2018   Osteoarthritis of first carpometacarpal joint 04/08/2018   Osteoarthritis of knee 12/07/2020   Other long term (current) drug therapy 12/07/2020   Pain due to onychomycosis of toenail of left foot 07/20/2019   Pain in joint 04/09/2018   Pain in knee 04/09/2018   Pain in unspecified knee 12/07/2020   Pain of finger 04/09/2018   Palpitations    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[place]) 12/07/2020   Personal history of colonic polyps 12/07/2020   Pneumonia    Pneumothorax 04/12/2020   Polypharmacy 04/09/2018   Porokeratosis 01/13/2019   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[place])    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) 12/07/2020   Pulmonary nodules  06/08/2016   Pure hypercholesterolemia 12/07/2020   Renal cyst 12/07/2020   Sleep disorder 04/08/2018   Status post bronchoscopy 04/12/2020   Tear of rotator cuff 04/09/2018   Tobacco user 67/06/4579   Uncomplicated alcohol dependence ([place]) 04/09/2018   Unspecified rotator cuff tear or rupture of unspecified shoulder, not specified as traumatic 12/07/2020   Unspecified tear of unspecified meniscus, current injury, unspecified knee, initial encounter 12/07/2020   Visual disturbance 12/07/2020   Vitamin D deficiency 04/08/2018    Tobacco History: Social History   Tobacco Use  Smoking Status Former   Packs/day: 0.75   Years: 67.00   Total pack years: 50.25   Types: Cigarettes   Start date: 32   Quit date: 07/13/2020   Years since quitting: 1.9  Smokeless Tobacco Never  Tobacco Comments   Recent Quit     Counseling given: Not Answered Tobacco comments: Recent Quit     Outpatient Medications Prior to Visit  Medication Sig Dispense Refill   albuterol (PROVENTIL) (2.5 MG/3ML) 0.083% nebulizer solution Take 3 mLs (2.5 mg total) by nebulization every 6 (six) hours as needed for wheezing or shortness of breath. 150 mL 5   albuterol (VENTOLIN HFA) 108 (90 Base) MCG/ACT inhaler Inhale 2 puffs into the lungs 3 (three) times daily as needed for wheezing or shortness of breath.     ammonium lactate (AMLACTIN) 12 % cream Apply topically  at bedtime.     Budeson-Glycopyrrol-Formoterol (BREZTRI AEROSPHERE) 160-9-4.8 MCG/ACT AERO Inhale 2 puffs into the lungs 2 (two) times daily. 32.1 g 3   clonazePAM (KLONOPIN) 0.5 MG tablet Take 0.5 mg by mouth daily as needed for anxiety.     clopidogrel (PLAVIX) 75 MG tablet Take 1 tablet (75 mg total) by mouth daily. 90 tablet 0   finasteride (PROSCAR) 5 MG tablet Take 5 mg by mouth daily.     furosemide (LASIX) 20 MG tablet Take 20 mg by mouth daily.     gabapentin (NEURONTIN) 100 MG capsule Take 100 mg by mouth 2 (two) times daily.     ketoconazole  (NIZORAL) 2 % cream Apply topically 2 (two) times daily.     Melatonin 5 MG CAPS Take 5 mg by mouth at bedtime.     nitroGLYCERIN (NITROSTAT) 0.4 MG SL tablet Place 1 tablet (0.4 mg total) under the tongue every 5 (five) minutes as needed for chest pain. 25 tablet 4   OXYGEN Inhale into the lungs. 2-3 liters upon exertion and 2 liters continuous at night.     pantoprazole (PROTONIX) 40 MG tablet Take 40 mg by mouth daily.     polyethylene glycol powder (GLYCOLAX/MIRALAX) 17 GM/SCOOP powder Take 255 g by mouth daily. (Patient taking differently: Take 1 Container by mouth daily as needed.) 510 g 3   predniSONE (DELTASONE) 10 MG tablet Take 3 tabs X 3 days, 2 tabs x 3 days, 1 tab x 3 days 30 tablet 0   rosuvastatin (CRESTOR) 20 MG tablet Take 1 tablet (20 mg total) by mouth at bedtime. 90 tablet 2   tamsulosin (FLOMAX) 0.4 MG CAPS capsule TAKE 1 CAPSULE BY MOUTH EVERY DAY 90 capsule 1   valsartan (DIOVAN) 80 MG tablet Take 80 mg by mouth daily.     amoxicillin-clavulanate (AUGMENTIN) 875-125 MG tablet Take 1 tablet by mouth 2 (two) times daily. (Patient not taking: Reported on 06/25/2022) 14 tablet 0   doxycycline (VIBRAMYCIN) 100 MG capsule Take 100 mg by mouth 2 (two) times daily. (Patient not taking: Reported on 06/25/2022)     No facility-administered medications prior to visit.     Review of Systems:   Constitutional:   No  weight loss, night sweats,  Fevers, chills,  +fatigue, or  lassitude.  HEENT:   No headaches,  Difficulty swallowing,  Tooth/dental problems, or  Sore throat,                No sneezing, itching, ear ache, nasal congestion, post nasal drip,   CV:  No chest pain,  Orthopnea, PND, swelling in lower extremities, anasarca, dizziness, palpitations, syncope.   GI  No heartburn, indigestion, abdominal pain, nausea, vomiting, diarrhea, change in bowel habits, loss of appetite, bloody stools.   Resp: .  No chest wall deformity  Skin: no rash or lesions.  GU: no dysuria,  change in color of urine, no urgency or frequency.  No flank pain, no hematuria   MS:  No joint pain or swelling.  No decreased range of motion.  No back pain.    Physical Exam  BP 100/60 (BP Location: Right Arm, Patient Position: Sitting, Cuff Size: Normal)   Pulse 70   Temp 98.1 F (36.7 C) (Oral)   Ht 5' 10.5" (1.791 m)   Wt 161 lb (73 kg)   SpO2 91%   BMI 22.77 kg/m   GEN: A/Ox3; pleasant , NAD, elderly and frail, on oxygen, wheelchair   HEENT:  [place]/AT, NOSE-clear, THROAT-clear, no lesions, no postnasal drip or exudate noted.   NECK:  Supple w/ fair ROM; no JVD; normal carotid impulses w/o bruits; no thyromegaly or nodules palpated; no lymphadenopathy.    RESP  Clear  P & A; w/o, wheezes/ rales/ or rhonchi. no accessory muscle use, no dullness to percussion  CARD:  RRR, no m/r/g, tr peripheral edema, pulses intact, no cyanosis or clubbing.  GI:   Soft & nt; nml bowel sounds; no organomegaly or masses detected.   Musco: Warm bil, no deformities or joint swelling noted.   Neuro: alert, no focal deficits noted.    Skin: Warm, no lesions or rashes    Lab Results:  CBC    Component Value Date/Time   WBC 14.2 (H) 05/30/2022 1106   RBC 4.94 05/30/2022 1106   HGB 15.3 05/30/2022 1106   HGB 15.7 04/09/2018 1650   HCT 46.1 05/30/2022 1106   HCT 44.6 04/09/2018 1650   PLT 332.0 05/30/2022 1106   PLT 285 04/09/2018 1650   MCV 93.3 05/30/2022 1106   MCV 88 04/09/2018 1650   MCH 29.8 04/12/2020 0607   MCHC 33.2 05/30/2022 1106   RDW 14.3 05/30/2022 1106   RDW 13.2 04/09/2018 1650   LYMPHSABS 0.8 05/30/2022 1106   LYMPHSABS 1.4 04/09/2018 1650   MONOABS 1.0 05/30/2022 1106   EOSABS 0.0 05/30/2022 1106   EOSABS 0.2 04/09/2018 1650   BASOSABS 0.0 05/30/2022 1106   BASOSABS 0.1 04/09/2018 1650    BMET    Component Value Date/Time   NA 142 05/30/2022 1106   NA 143 04/03/2022 0921   K 5.1 05/30/2022 1106   CL 102 05/30/2022 1106   CO2 33 (H) 05/30/2022 1106    GLUCOSE 87 05/30/2022 1106   BUN 38 (H) 05/30/2022 1106   BUN 23 04/03/2022 0921   CREATININE 1.20 05/30/2022 1106   CALCIUM 9.1 05/30/2022 1106   GFRNONAA 58 (L) 04/12/2020 0607   GFRAA >60 04/12/2020 0607    BNP No results found for: "BNP"  ProBNP    Component Value Date/Time   PROBNP 34.0 05/30/2022 1106    Imaging: DG Chest 2 View  Result Date: 06/18/2022 CLINICAL DATA:  COPD exacerbation. EXAM: CHEST - 2 VIEW COMPARISON:  04/17/2022 FINDINGS: Lungs are hyperexpanded. Interstitial markings are diffusely coarsened with chronic features. No focal consolidation. No pleural effusion. The cardiopericardial silhouette is within normal limits for size. The visualized bony structures of the thorax are unremarkable. IMPRESSION: Hyperexpansion consistent with emphysema. No focal airspace consolidation. Multiple pulmonary nodule seen on chest CT 03/07/2022 not readily evident by x-ray. Electronically Signed   By: Misty Stanley M.D.   On: 06/18/2022 11:27         Latest Ref Rng & Units 05/02/2018    2:45 PM  PFT Results  FVC-Pre L 2.50   FVC-Predicted Pre % 68   FVC-Post L 2.52   FVC-Predicted Post % 68   Pre FEV1/FVC % % 37   Post FEV1/FCV % % 40   FEV1-Pre L 0.92   FEV1-Predicted Pre % 35   FEV1-Post L 1.02   DLCO uncorrected ml/min/mmHg 11.04   DLCO UNC% % 38   DLCO corrected ml/min/mmHg 10.72   DLCO COR %Predicted % 37   DLVA Predicted % 45   TLC L 8.50   TLC % Predicted % 131   RV % Predicted % 213     No results found for: "NITRICOXIDE"      Assessment & Plan:  Chronic obstructive pulmonary disease with (acute) exacerbation (HCC) Resolving COPD flare.  Patient is clinically stable and seems to have slight improvement of symptoms.  No further antibiotics.  Chest x-ray without acute process.  Patient has a decreased pulmonary reserve.  Advised to advance activity as tolerated.  Recent lab work and chest x-ray are reassuring. We will taper down slowly to 10 mg  and hold at this dose until follow-up in 2 weeks.  Plan  Patient Instructions  Mucinex DM twice daily as needed for cough and congestion Add Flutter valve Three times a day   Taper prednisone as directed down to 10 mg and hold at this dose (Prednisone '20mg'$  daily for 3 days then '10mg'$  daily )  Continue on BREZTRI 2 puffs Twice daily  , rinse after use.  Albuterol Inhaler or neb as needed.  Continue on Oxygen 3-4 l/m with activity and At bedtime  Use Continuous flow Oxygen 2l/m At bedtime   Follow up with Dr. Lamonte Sakai or Ashad Fawbush NP in 2 week and As needed   Please contact office for sooner follow up if symptoms do not improve or worsen or seek emergency care       Chronic respiratory failure with hypoxia ([place]) Continue on oxygen to maintain O2 saturations greater than 88 to 90%     Rexene Edison, NP 06/25/2022

## 2022-07-03 ENCOUNTER — Telehealth: Payer: Self-pay

## 2022-07-03 NOTE — Telephone Encounter (Signed)
Spoke with pt who states his SOB and wheezing have not improved even though he completed his Prednisone taper. Pt does not want to go to the hospital because he is afraid if admitted he will not be home this weekend for his wife's birthday. Pt is scheduled for OV with Tammy Parrett on Monday 07/09/22 and was offered earlier appointments with other NP s but refused. Pt instructed that since he was not improving and in fact getting slightly worse he should go to ED or UC. Pt stated he would not be going and would just wait it out until he sees Tammy in office. Again pt was instructed to go to UC or ED if symptoms did not improve or got worse. Pt stated understanding along with his refusal to go.   Routing to [employer] as [employer]

## 2022-07-03 NOTE — Telephone Encounter (Signed)
Called and left message with wife for pt to call back regarding his symptoms.

## 2022-07-04 NOTE — Telephone Encounter (Signed)
Spoke to patient offered office visit today or tomorrow in the office to reevaluate however patient declined and states he wants to celebrate his wife's birthday and then will regroup next week.  Advised if symptoms worsen he is to seek emergency room or urgent care attention Patient complains he is not getting worse but just not getting any better.  Please contact office for sooner follow up if symptoms do not improve or worsen or seek emergency care

## 2022-07-05 ENCOUNTER — Ambulatory Visit (INDEPENDENT_AMBULATORY_CARE_PROVIDER_SITE_OTHER): Payer: Medicare Other | Admitting: Internal Medicine

## 2022-07-05 ENCOUNTER — Encounter: Payer: Self-pay | Admitting: Internal Medicine

## 2022-07-05 VITALS — BP 98/60 | HR 57 | Temp 97.6°F | Ht 70.5 in | Wt 160.0 lb

## 2022-07-05 DIAGNOSIS — J9611 Chronic respiratory failure with hypoxia: Secondary | ICD-10-CM

## 2022-07-05 DIAGNOSIS — J9612 Chronic respiratory failure with hypercapnia: Secondary | ICD-10-CM

## 2022-07-05 DIAGNOSIS — F1021 Alcohol dependence, in remission: Secondary | ICD-10-CM | POA: Diagnosis not present

## 2022-07-05 DIAGNOSIS — K219 Gastro-esophageal reflux disease without esophagitis: Secondary | ICD-10-CM

## 2022-07-05 DIAGNOSIS — Z7952 Long term (current) use of systemic steroids: Secondary | ICD-10-CM | POA: Insufficient documentation

## 2022-07-05 DIAGNOSIS — I2510 Atherosclerotic heart disease of native coronary artery without angina pectoris: Secondary | ICD-10-CM

## 2022-07-05 DIAGNOSIS — J441 Chronic obstructive pulmonary disease with (acute) exacerbation: Secondary | ICD-10-CM | POA: Diagnosis not present

## 2022-07-05 DIAGNOSIS — C61 Malignant neoplasm of prostate: Secondary | ICD-10-CM | POA: Diagnosis not present

## 2022-07-05 MED ORDER — METHYLPREDNISOLONE ACETATE 80 MG/ML IJ SUSP
80.0000 mg | Freq: Once | INTRAMUSCULAR | Status: AC
  Administered 2022-07-05: 80 mg via INTRAMUSCULAR

## 2022-07-05 NOTE — Assessment & Plan Note (Signed)
Cath 2019 with stents No chest pain now  On plavix and crestor '20mg'$ 

## 2022-07-05 NOTE — Assessment & Plan Note (Signed)
Continues to be abstinent since 2014

## 2022-07-05 NOTE — Assessment & Plan Note (Addendum)
Feels he may want to try going into hospital next week to finally break this flare--but will discuss with Tammy on Monday Continue the breztri and albuterol Will give depomedrol '80mg'$  IM now--continue prednisone '10mg'$  daily

## 2022-07-05 NOTE — Progress Notes (Signed)
Subjective:    Patient ID: [name] Kemps., male    DOB: 08-21-1940, 81 y.o.   MRN: [id number]  HPI Here with wife to establish care PCP retired Lives at 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 Ongoing symptoms though Had doxycycline--off now Back to prednisone '10mg'$  daily. Is steroid dependent but had been on '5mg'$  Really tough month---and plans this weekend (wife's birthday)  Known CAD---2 stents  No chest pain Occasional racing sensation--with SOB Does get edema--is on diuretic. Compression socks at times  Has oxygen 2l/min Does increase to 4liters with walking Uses transport chair and medical scooter at [facility] Occasionally keeps oxygen off while just sitting and watching TV  Current Outpatient Medications on File Prior to Visit  Medication Sig Dispense Refill   albuterol (PROVENTIL) (2.5 MG/3ML) 0.083% nebulizer solution Take 3 mLs (2.5 mg total) by nebulization every 6 (six) hours as needed for wheezing or shortness of breath. 150 mL 5   albuterol (VENTOLIN HFA) 108 (90 Base) MCG/ACT inhaler Inhale 2 puffs into the lungs 3 (three) times daily as needed for wheezing or shortness of breath.     ammonium lactate (AMLACTIN) 12 % cream Apply topically at bedtime.     Budeson-Glycopyrrol-Formoterol (BREZTRI AEROSPHERE) 160-9-4.8 MCG/ACT AERO Inhale 2 puffs into the lungs 2 (two) times daily. 32.1 g 3   clonazePAM (KLONOPIN) 0.5 MG tablet Take 0.5 mg by mouth daily as needed for anxiety.     clopidogrel (PLAVIX) 75 MG tablet Take 1 tablet (75 mg total) by mouth daily. 90 tablet 0   finasteride (PROSCAR) 5 MG tablet Take 5 mg by mouth daily.     furosemide (LASIX) 20 MG tablet Take 20 mg by mouth daily.     gabapentin (NEURONTIN) 100 MG capsule Take 100 mg by mouth 2 (two) times daily.     ketoconazole (NIZORAL) 2 % cream Apply topically 2 (two) times daily.     Melatonin 5 MG CAPS  Take 5 mg by mouth at bedtime.     nitroGLYCERIN (NITROSTAT) 0.4 MG SL tablet Place 1 tablet (0.4 mg total) under the tongue every 5 (five) minutes as needed for chest pain. 25 tablet 4   OXYGEN Inhale into the lungs. 2-3 liters upon exertion and 2 liters continuous at night.     pantoprazole (PROTONIX) 40 MG tablet Take 40 mg by mouth daily.     polyethylene glycol powder (GLYCOLAX/MIRALAX) 17 GM/SCOOP powder Take 255 g by mouth daily. (Patient taking differently: Take 1 Container by mouth daily as needed.) 510 g 3   predniSONE (DELTASONE) 10 MG tablet Take 10 mg by mouth daily with breakfast.     rosuvastatin (CRESTOR) 20 MG tablet Take 1 tablet (20 mg total) by mouth at bedtime. 90 tablet 2   tamsulosin (FLOMAX) 0.4 MG CAPS capsule TAKE 1 CAPSULE BY MOUTH EVERY DAY 90 capsule 1   valsartan (DIOVAN) 80 MG tablet Take 80 mg by mouth daily.     No current facility-administered medications on file prior to visit.    Allergies  Allergen Reactions   Flagyl [Metronidazole] Other (See Comments)    Other reaction(s): skin reaction    Past Medical History:  Diagnosis Date   Abdominal discomfort 12/07/2020   Acquired trigger finger of right middle finger 02/04/2020   Acute prostatitis 04/08/2018   Alcohol abuse    Alcohol dependence ([place]) 12/07/2020   Anal or rectal pain 04/08/2018  Annual physical exam 12/07/2020   Anorectal disorder 12/07/2020   Arthritis    Atherosclerotic heart disease of native coronary artery without angina pectoris 04/08/2018   Benign prostatic hyperplasia with lower urinary tract symptoms 12/07/2020   BPH with elevated PSA    Cancer ([place])    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   Chronic obstructive pulmonary disease with (acute) exacerbation (HCC) 12/07/2020   Chronic respiratory failure with hypoxia (HCC) 08/10/2019   Chronic sinusitis 12/07/2020   Cigarette nicotine dependence, uncomplicated 78/93/8101   Coagulation disorder ([place]) 01/13/2019   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   Contracture of palmar fascia 12/07/2020   COPD (chronic obstructive pulmonary disease) ([place])    Corn of toe 12/07/2020   Corns and callosities 04/09/2018   Coronary artery disease    2019 with stents   Coronary atherosclerosis 05/07/2018   Degenerative disc disease, 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   Degenerative disc disease, 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   Diarrhea 04/09/2018   Double vision with both eyes open 12/07/2020   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   Dysphagia 08/14/2018   Dyspnea    ED (erectile dysfunction)    ED (erectile dysfunction) of organic origin 12/07/2020   Elevated prostate specific antigen (PSA) 04/08/2018   Elevated PSA    Encounter for screening for other disorder 12/07/2020   Enlarged prostate without lower urinary tract symptoms (luts) 04/09/2018   Enthesopathy 12/07/2020   Essential (primary) hypertension 04/08/2018   Ex-smoker 04/09/2018   Exertional dyspnea 04/02/2018   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   Flatulence 04/08/2018   Gastro-esophageal reflux disease without esophagitis 04/08/2018   GERD (gastroesophageal reflux disease)    History of acute otitis externa 08/14/2018   Hyperlipidemia    Hypertension    Hypoxemia 12/07/2020   Kidney cysts    Left knee pain 12/07/2020   Low back pain 04/08/2018   Male erectile dysfunction 04/09/2018   Mixed hyperlipidemia 04/08/2018   Nocturia more than twice per night 04/09/2018   Osteoarthritis of first carpometacarpal joint 04/08/2018   Osteoarthritis of knee 12/07/2020   Other long term (current) drug therapy 12/07/2020   Pain due to onychomycosis of toenail of left foot 07/20/2019   Pain in joint 04/09/2018   Pain in knee 04/09/2018   Pain in unspecified knee 12/07/2020   Pain of finger 04/09/2018   Palpitations    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[place]) 12/07/2020   Personal history of colonic polyps 12/07/2020   Pneumonia     Pneumothorax 04/12/2020   Polypharmacy 04/09/2018   Porokeratosis 01/13/2019   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[place])    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   Pulmonary nodules 06/08/2016   Pure hypercholesterolemia 12/07/2020   Renal cyst 12/07/2020   Sleep disorder 04/08/2018   Status post bronchoscopy 04/12/2020   Tear of rotator cuff 04/09/2018   Tobacco user 75/04/2584   Uncomplicated alcohol dependence ([place]) 04/09/2018   Unspecified rotator cuff tear or rupture of unspecified shoulder, not specified as traumatic 12/07/2020   Unspecified tear of unspecified meniscus, current injury, unspecified knee, initial encounter 12/07/2020   Visual disturbance 12/07/2020   Vitamin D deficiency 04/08/2018    Past Surgical History:  Procedure Laterality Date   BRONCHIAL BIOPSY  10/13/2019   Procedure: BRONCHIAL BIOPSIES;  Surgeon: Collene Gobble, MD;  Location: [place];  Service: Pulmonary;;   BRONCHIAL BIOPSY  04/12/2020   Procedure: BRONCHIAL BIOPSIES;  Surgeon: Collene Gobble, MD;  Location: [facility] ENDOSCOPY;  Service: Pulmonary;;   BRONCHIAL  BRUSHINGS  10/13/2019   Procedure: BRONCHIAL BRUSHINGS;  Surgeon: Collene Gobble, MD;  Location: [facility] ENDOSCOPY;  Service: Pulmonary;;   BRONCHIAL BRUSHINGS  04/12/2020   Procedure: BRONCHIAL BRUSHINGS;  Surgeon: Collene Gobble, MD;  Location: [facility] ENDOSCOPY;  Service: Pulmonary;;   BRONCHIAL NEEDLE ASPIRATION BIOPSY  10/13/2019   Procedure: BRONCHIAL NEEDLE ASPIRATION BIOPSIES;  Surgeon: Collene Gobble, MD;  Location: [facility] ENDOSCOPY;  Service: Pulmonary;;   BRONCHIAL NEEDLE ASPIRATION BIOPSY  04/12/2020   Procedure: BRONCHIAL NEEDLE ASPIRATION BIOPSIES;  Surgeon: Collene Gobble, MD;  Location: [facility] ENDOSCOPY;  Service: Pulmonary;;   BRONCHIAL WASHINGS  10/13/2019   Procedure: BRONCHIAL WASHINGS;  Surgeon: Collene Gobble, MD;  Location: [place];  Service: Pulmonary;;   BRONCHIAL WASHINGS  04/12/2020   Procedure: BRONCHIAL WASHINGS;  Surgeon: Collene Gobble, MD;   Location: [facility] ENDOSCOPY;  Service: Pulmonary;;   CARDIAC CATHETERIZATION  2002   50% RCA   CATARACT EXTRACTION, BILATERAL     CORONARY STENT INTERVENTION N/A 04/15/2018   Procedure: CORONARY STENT INTERVENTION;  Surgeon: Jettie Booze, MD;  Location: [place] CV LAB;  Service: Cardiovascular;  Laterality: N/A;  om1   EYE SURGERY     KNEE ARTHROSCOPY     LEFT HEART CATH AND CORONARY ANGIOGRAPHY N/A 04/15/2018   Procedure: LEFT HEART CATH AND CORONARY ANGIOGRAPHY;  Surgeon: Jettie Booze, MD;  Location: [place] [facility];  Service: Cardiovascular;  Laterality: N/A;   MULTIPLE TOOTH EXTRACTIONS     TONSILLECTOMY     VIDEO BRONCHOSCOPY  04/12/2020   VIDEO BRONCHOSCOPY WITH ENDOBRONCHIAL NAVIGATION N/A 07/26/2016   Procedure: VIDEO BRONCHOSCOPY WITH ENDOBRONCHIAL NAVIGATION;  Surgeon: Collene Gobble, MD;  Location: [place];  Service: Thoracic;  Laterality: N/A;   VIDEO BRONCHOSCOPY WITH ENDOBRONCHIAL NAVIGATION N/A 10/13/2019   Procedure: [facility] AND VIDEO BRONCHOSCOPY WITH ENDOBRONCHIAL NAVIGATION;  Surgeon: Collene Gobble, MD;  Location: [place] ENDOSCOPY;  Service: Pulmonary;  Laterality: N/A;   VIDEO BRONCHOSCOPY WITH ENDOBRONCHIAL NAVIGATION N/A 04/12/2020   Procedure: VIDEO BRONCHOSCOPY WITH ENDOBRONCHIAL NAVIGATION;  Surgeon: Collene Gobble, MD;  Location: [place] ENDOSCOPY;  Service: Pulmonary;  Laterality: N/A;    Family History  Problem Relation Age of Onset   Hypertension Mother    Alzheimer's disease Mother     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  Tobacco Use   Smoking status: Former    Packs/day: 0.75    Years: 67.00    Total pack years: 50.25    Types: Cigarettes    Start date: 1954    Quit date: 07/13/2020    Years  since quitting: 1.9    Passive exposure: Never   Smokeless tobacco: Never   Tobacco comments:    Recent Quit    Vaping Use   Vaping Use: Former  Substance and Sexual Activity   Alcohol use: No    Alcohol/week: 0.0 standard drinks of alcohol    Comment: quit drinking 2014   Drug use: No   Sexual activity: Not on file  Other Topics Concern   Not on file  Social History Narrative   Has living will   Wife is health care POA--alternate is son Buelah Manis)   Has daughter in  [place]   Has DNR   No tube feeds if cognitively unaware   Social Determinants of Health   Financial Resource Strain: Not on file  Food Insecurity: Not on file  Transportation Needs: Not on file  Physical Activity: Not on file  Stress: Not on file  Social Connections: Not on file  Intimate Partner Violence: Not on file    Review of Systems Appetite is okay--taste off some though Weight is stable--with ice cream/mild shakes Sleeps okay      Objective:   Physical Exam Constitutional:      Appearance: Normal appearance.  Cardiovascular:     Rate and Rhythm: Normal rate and regular rhythm.     Comments: Distant sub-xiphoid heart sounds Pulmonary:     Effort: Pulmonary effort is normal.     Comments: Reduced but audible breath sounds Moderately prolonged expiratory phase with wheezing/rhonchi Abdominal:     Palpations: Abdomen is soft.     Tenderness: There is no abdominal tenderness.  Musculoskeletal:     Cervical back: Neck supple.     Comments: Trace pedal edema  Lymphadenopathy:     Cervical: No cervical adenopathy.  Neurological:     Mental Status: He is alert.            Assessment & Plan:

## 2022-07-05 NOTE — Assessment & Plan Note (Signed)
Asked him to restart vitamin D Will check DEXA

## 2022-07-05 NOTE — Assessment & Plan Note (Signed)
Quiet on pantoprazole '40mg'$  daily Given the COPD---would not wean

## 2022-07-05 NOTE — Assessment & Plan Note (Signed)
On oxygen all the time pretty much

## 2022-07-05 NOTE — Addendum Note (Signed)
Addended by: Pilar Grammes on: 07/05/2022 10:29 AM   Modules accepted: Orders

## 2022-07-05 NOTE — Assessment & Plan Note (Signed)
Last PSA 13---down from 18-19 (last year) Not doing any treatment

## 2022-07-09 ENCOUNTER — Ambulatory Visit: Payer: Medicare Other | Admitting: Adult Health

## 2022-07-09 ENCOUNTER — Encounter: Payer: Self-pay | Admitting: Adult Health

## 2022-07-09 ENCOUNTER — Other Ambulatory Visit: Payer: Self-pay | Admitting: Urology

## 2022-07-09 VITALS — BP 110/60 | HR 82 | Temp 97.6°F | Ht 70.5 in | Wt 160.0 lb

## 2022-07-09 DIAGNOSIS — J431 Panlobular emphysema: Secondary | ICD-10-CM

## 2022-07-09 DIAGNOSIS — R0609 Other forms of dyspnea: Secondary | ICD-10-CM

## 2022-07-09 DIAGNOSIS — J9611 Chronic respiratory failure with hypoxia: Secondary | ICD-10-CM

## 2022-07-09 NOTE — Patient Instructions (Addendum)
Set up 2 D echo .  Mucinex DM twice daily as needed for cough and congestion Flutter valve Three times a day   Continue on Prednisone '10mg'$  daily  Continue on BREZTRI 2 puffs Twice daily  , rinse after use.  Albuterol Inhaler or neb as needed.  Continue on Oxygen 3-4 l/m  Saline nasal gel Twice daily   Follow up with Dr. Lamonte Sakai or Brennen Gardiner NP in 6 week and As needed   Please contact office for sooner follow up if symptoms do not improve or worsen or seek emergency care

## 2022-07-09 NOTE — Assessment & Plan Note (Signed)
Slow to resolve COPD exacerbation.  Patient has significant symptom burden.  Workup has been essentially unrevealing.  Chest x-ray without acute process.  Lab work with normal D-dimer and BNP.  He has been given 2 antibiotics and a steroid burst.  Now back to 10 mg at new baseline.  Will check 2D echo to rule out underlying cardiac source of increased shortness of breath and decreased activity tolerance.  Plan  Patient Instructions  Set up 2 D echo .  Mucinex DM twice daily as needed for cough and congestion Flutter valve Three times a day   Continue on Prednisone '10mg'$  daily  Continue on BREZTRI 2 puffs Twice daily  , rinse after use.  Albuterol Inhaler or neb as needed.  Continue on Oxygen 3-4 l/m  Saline nasal gel Twice daily   Follow up with Dr. Lamonte Sakai or Khi Mcmillen NP in 6 week and As needed   Please contact office for sooner follow up if symptoms do not improve or worsen or seek emergency care

## 2022-07-09 NOTE — Assessment & Plan Note (Signed)
Continue on oxygen to maintain O2 saturations greater than 88 to 90%. 

## 2022-07-09 NOTE — Progress Notes (Signed)
$'@Patient'N$  ID: [name] [name]., male    DOB: March 17, 1941, 81 y.o.   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  Patient is on chronic steroids with baseline prednisone at 5 mg daily Underwent serial bronchoscopies due to pulmonary nodularity.  No evidence of malignancy but possible underlying granulomatous inflammation.  HSP panel was negative.  TEST/EVENTS :  CT chest 02/26/2020 reviewed, showed multiple bilateral irregular peribronchovascular nodules in postinflammatory infectious pattern   CT chest March 04, 2021 dominant pulmonary nodules are decreased in size.  Interval new nodules are noted.  Maximum size 7 mm (plan repeat 02/2022)    PFTs October 2019 showed severe COPD with FEV1 at 38%, ratio 40, FVC 68%, no significant bronchodilator response, mid flow reversibility, DLCO 38%.   CT chest 03/07/22-CT chest shows advanced emphysema. New nodule in the right upper lobe measuring 6 mm and small 7 mm right middle lobe nodule Stable right upper lobe nodule. Decreased size of right lower lobe nodule. Stable left upper lobe nodules.  07/09/2022 Follow up : COPD , O2 RF  Patient returns for 2-week follow-up.  Patient was seen last visit with a slow to resolve COPD flare over the last 6 to 7 weeks.  Patient was initially treated with doxycycline.  He then continued to have ongoing symptoms with cough congestion.  Was given Augmentin x 7 days and a prednisone taper.  Chest x-ray showed no acute process.  Previous lab work showed a normal BNP and D-dimer.  White count was elevated consistent with a COPD exacerbation and also on steroids.  Last visit patient was tapered down to prednisone 10 mg and held.  Flutter valve was added.  Patient says he is doing slightly better however continues to be very  short of breath with activities.  Has decreased activity tolerance.  Feels that he just has not bounced back.  He does get some mild ankle edema that is worse in the evening hours.  He is currently using Breztri twice daily.  Endorses compliance.  He remains on oxygen 3 to 4 L.  Does have a stuffy nose and occasional bloody nasal discharge. Patient denies any hemoptysis, chest pain, orthopnea, abdominal pain nausea vomiting.  Allergies  Allergen Reactions   Flagyl [Metronidazole] Other (See Comments)    Other reaction(s): skin reaction    Immunization History  Administered Date(s) Administered   Fluad Quad(high Dose 65+) 04/13/2020, 03/29/2021, 05/18/2022   Influenza Split 04/19/2009, 04/26/2010, 05/14/2011, 05/06/2015, 02/28/2019, 04/13/2020   Influenza, High Dose Seasonal PF 04/02/2018, 03/11/2019, 04/13/2020   Influenza,inj,Quad PF,6+ Mos 03/30/2016   PFIZER(Purple Top)SARS-COV-2 Vaccination 08/13/2019, 09/01/2019, 05/24/2020, 09/13/2020   Pneumococcal Conjugate-13 04/29/2014   Pneumococcal Polysaccharide-23 09/12/2005, 05/01/2013   Zoster, Live 04/13/2008    Past Medical History:  Diagnosis Date   Abdominal discomfort 12/07/2020   Acquired trigger finger of right middle finger 02/04/2020   Acute prostatitis 04/08/2018   Alcohol abuse    Alcohol dependence ([place]) 12/07/2020   Anal or rectal pain 04/08/2018   Annual physical exam 12/07/2020   Anorectal disorder 12/07/2020   Arthritis    Atherosclerotic heart disease of native coronary artery without angina pectoris 04/08/2018   Benign prostatic hyperplasia with lower urinary tract symptoms 12/07/2020   BPH with elevated PSA    Cancer ([place])    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   Chronic obstructive pulmonary disease with (acute) exacerbation (HCC) 12/07/2020   Chronic respiratory failure with hypoxia (HCC) 08/10/2019   Chronic sinusitis 12/07/2020   Cigarette nicotine dependence, uncomplicated 09/38/1829   Coagulation  disorder ([place]) 01/13/2019   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   Contracture of palmar fascia 12/07/2020   COPD (chronic obstructive pulmonary disease) ([place])    Corn of toe 12/07/2020   Corns and callosities 04/09/2018   Coronary artery disease    2019 with stents   Coronary atherosclerosis 05/07/2018   Degenerative disc disease, 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   Degenerative disc disease, 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   Diarrhea 04/09/2018   Double vision with both eyes open 12/07/2020   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   Dysphagia 08/14/2018   Dyspnea    ED (erectile dysfunction)    ED (erectile dysfunction) of organic origin 12/07/2020   Elevated prostate specific antigen (PSA) 04/08/2018   Elevated PSA    Encounter for screening for other disorder 12/07/2020   Enlarged prostate without lower urinary tract symptoms (luts) 04/09/2018   Enthesopathy 12/07/2020   Essential (primary) hypertension 04/08/2018   Ex-smoker 04/09/2018   Exertional dyspnea 04/02/2018   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   Flatulence 04/08/2018   Gastro-esophageal reflux disease without esophagitis 04/08/2018   GERD (gastroesophageal reflux disease)    History of acute otitis externa 08/14/2018   Hyperlipidemia    Hypertension    Hypoxemia 12/07/2020   Kidney cysts    Left knee pain 12/07/2020   Low back pain 04/08/2018   Male erectile dysfunction 04/09/2018   Mixed hyperlipidemia 04/08/2018   Nocturia more than twice per night 04/09/2018   Osteoarthritis of first carpometacarpal joint 04/08/2018   Osteoarthritis of knee 12/07/2020   Other long term (current) drug therapy 12/07/2020   Pain due to onychomycosis of toenail of left foot 07/20/2019   Pain in joint 04/09/2018   Pain in knee 04/09/2018   Pain in unspecified knee 12/07/2020   Pain of finger 04/09/2018   Palpitations    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[place]) 12/07/2020   Personal history of colonic  polyps 12/07/2020   Pneumonia    Pneumothorax 04/12/2020   Polypharmacy 04/09/2018   Porokeratosis 01/13/2019   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[place])    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[place]) 12/07/2020   Pulmonary nodules 06/08/2016   Pure hypercholesterolemia 12/07/2020   Renal cyst 12/07/2020   Sleep disorder 04/08/2018   Status post bronchoscopy 04/12/2020   Tear of rotator cuff 04/09/2018   Tobacco user 93/71/6967   Uncomplicated alcohol dependence ([place]) 04/09/2018   Unspecified rotator cuff tear or rupture of unspecified shoulder, not specified as traumatic 12/07/2020   Unspecified tear of unspecified meniscus, current injury, unspecified knee, initial encounter 12/07/2020   Visual disturbance 12/07/2020   Vitamin D deficiency 04/08/2018    Tobacco History: Social History   Tobacco Use  Smoking Status Former   Packs/day: 0.75   Years: 67.00   Total pack years: 50.25   Types: Cigarettes   Start date: 19   Quit date: 07/13/2020   Years since quitting: 1.9   Passive exposure: Never  Smokeless Tobacco Never  Tobacco Comments   Recent Quit     Counseling given: Not Answered Tobacco comments: Recent Quit     Outpatient Medications Prior to Visit  Medication Sig Dispense Refill   albuterol (PROVENTIL) (2.5 MG/3ML) 0.083% nebulizer solution Take 3 mLs (2.5 mg total) by nebulization every 6 (six) hours as needed for  wheezing or shortness of breath. 150 mL 5   albuterol (VENTOLIN HFA) 108 (90 Base) MCG/ACT inhaler Inhale 2 puffs into the lungs 3 (three) times daily as needed for wheezing or shortness of breath.     ammonium lactate (AMLACTIN) 12 % cream Apply topically at bedtime.     Budeson-Glycopyrrol-Formoterol (BREZTRI AEROSPHERE) 160-9-4.8 MCG/ACT AERO Inhale 2 puffs into the lungs 2 (two) times daily. 32.1 g 3   clonazePAM (KLONOPIN) 0.5 MG tablet Take 0.5 mg by mouth daily as needed for anxiety.     clopidogrel (PLAVIX) 75 MG tablet Take 1 tablet (75 mg total) by mouth daily. 90 tablet 0    finasteride (PROSCAR) 5 MG tablet Take 5 mg by mouth daily.     furosemide (LASIX) 20 MG tablet Take 20 mg by mouth daily.     gabapentin (NEURONTIN) 100 MG capsule Take 100 mg by mouth 2 (two) times daily.     ketoconazole (NIZORAL) 2 % cream Apply topically 2 (two) times daily.     Melatonin 5 MG CAPS Take 5 mg by mouth at bedtime.     nitroGLYCERIN (NITROSTAT) 0.4 MG SL tablet Place 1 tablet (0.4 mg total) under the tongue every 5 (five) minutes as needed for chest pain. 25 tablet 4   OXYGEN Inhale into the lungs. 2-3 liters upon exertion and 2 liters continuous at night.     pantoprazole (PROTONIX) 40 MG tablet Take 40 mg by mouth daily.     polyethylene glycol powder (GLYCOLAX/MIRALAX) 17 GM/SCOOP powder Take 255 g by mouth daily. (Patient taking differently: Take 1 Container by mouth daily as needed.) 510 g 3   predniSONE (DELTASONE) 10 MG tablet Take 10 mg by mouth daily with breakfast.     rosuvastatin (CRESTOR) 20 MG tablet Take 1 tablet (20 mg total) by mouth at bedtime. 90 tablet 2   tamsulosin (FLOMAX) 0.4 MG CAPS capsule TAKE 1 CAPSULE BY MOUTH EVERY DAY 90 capsule 1   valsartan (DIOVAN) 80 MG tablet Take 80 mg by mouth daily.     No facility-administered medications prior to visit.     Review of Systems:   Constitutional:   No  weight loss, night sweats,  Fevers, chills, ' +fatigue, or  lassitude.  HEENT:   No headaches,  Difficulty swallowing,  Tooth/dental problems, or  Sore throat,                No sneezing, itching, ear ache, nasal congestion, post nasal drip,   CV:  No chest pain,  Orthopnea, PND, swelling in lower extremities, anasarca, dizziness, palpitations, syncope.   GI  No heartburn, indigestion, abdominal pain, nausea, vomiting, diarrhea, change in bowel habits, loss of appetite, bloody stools.   Resp:   No chest wall deformity  Skin: no rash or lesions.  GU: no dysuria, change in color of urine, no urgency or frequency.  No flank pain, no hematuria    MS:  No joint pain or swelling.  No decreased range of motion.  No back pain.    Physical Exam  BP 110/60   Pulse 82   Temp 97.6 F (36.4 C) (Oral)   Ht 5' 10.5" (1.791 m)   Wt 160 lb (72.6 kg)   SpO2 90%   BMI 22.63 kg/m   GEN: A/Ox3; pleasant , NAD, elderly, frail-appearing, wheelchair, oxygen   HEENT:  [place]/AT,  NOSE-clear, THROAT-clear, no lesions, no postnasal drip or exudate noted.   NECK:  Supple w/ fair ROM; no JVD;  normal carotid impulses w/o bruits; no thyromegaly or nodules palpated; no lymphadenopathy.    RESP  Clear  P & A; w/o, wheezes/ rales/ or rhonchi. no accessory muscle use, no dullness to percussion  CARD:  RRR, no m/r/g, tr-1+  peripheral edema, pulses intact, no cyanosis or clubbing.  GI:   Soft & nt; nml bowel sounds; no organomegaly or masses detected.   Musco: Warm bil, no deformities or joint swelling noted.   Neuro: alert, no focal deficits noted.    Skin: Warm, no lesions or rashes    Lab Results:  CBC    Component Value Date/Time   WBC 14.2 (H) 05/30/2022 1106   RBC 4.94 05/30/2022 1106   HGB 15.3 05/30/2022 1106   HGB 15.7 04/09/2018 1650   HCT 46.1 05/30/2022 1106   HCT 44.6 04/09/2018 1650   PLT 332.0 05/30/2022 1106   PLT 285 04/09/2018 1650   MCV 93.3 05/30/2022 1106   MCV 88 04/09/2018 1650   MCH 29.8 04/12/2020 0607   MCHC 33.2 05/30/2022 1106   RDW 14.3 05/30/2022 1106   RDW 13.2 04/09/2018 1650   LYMPHSABS 0.8 05/30/2022 1106   LYMPHSABS 1.4 04/09/2018 1650   MONOABS 1.0 05/30/2022 1106   EOSABS 0.0 05/30/2022 1106   EOSABS 0.2 04/09/2018 1650   BASOSABS 0.0 05/30/2022 1106   BASOSABS 0.1 04/09/2018 1650    BMET    Component Value Date/Time   NA 142 05/30/2022 1106   NA 143 04/03/2022 0921   K 5.1 05/30/2022 1106   CL 102 05/30/2022 1106   CO2 33 (H) 05/30/2022 1106   GLUCOSE 87 05/30/2022 1106   BUN 38 (H) 05/30/2022 1106   BUN 23 04/03/2022 0921   CREATININE 1.20 05/30/2022 1106   CALCIUM 9.1  05/30/2022 1106   GFRNONAA 58 (L) 04/12/2020 0607   GFRAA >60 04/12/2020 0607    BNP No results found for: "BNP"  ProBNP    Component Value Date/Time   PROBNP 34.0 05/30/2022 1106    Imaging: DG Chest 2 View  Result Date: 06/18/2022 CLINICAL DATA:  COPD exacerbation. EXAM: CHEST - 2 VIEW COMPARISON:  04/17/2022 FINDINGS: Lungs are hyperexpanded. Interstitial markings are diffusely coarsened with chronic features. No focal consolidation. No pleural effusion. The cardiopericardial silhouette is within normal limits for size. The visualized bony structures of the thorax are unremarkable. IMPRESSION: Hyperexpansion consistent with emphysema. No focal airspace consolidation. Multiple pulmonary nodule seen on chest CT 03/07/2022 not readily evident by x-ray. Electronically Signed   By: Misty Stanley M.D.   On: 06/18/2022 11:27    methylPREDNISolone acetate (DEPO-MEDROL) injection 80 mg     Date Action Dose Route User   07/05/2022 1027 Given 80 mg Intramuscular (Right Upper Outer Quadrant) Pilar Grammes, CMA          Latest Ref Rng & Units 05/02/2018    2:45 PM  PFT Results  FVC-Pre L 2.50   FVC-Predicted Pre % 68   FVC-Post L 2.52   FVC-Predicted Post % 68   Pre FEV1/FVC % % 37   Post FEV1/FCV % % 40   FEV1-Pre L 0.92   FEV1-Predicted Pre % 35   FEV1-Post L 1.02   DLCO uncorrected ml/min/mmHg 11.04   DLCO UNC% % 38   DLCO corrected ml/min/mmHg 10.72   DLCO COR %Predicted % 37   DLVA Predicted % 45   TLC L 8.50   TLC % Predicted % 131   RV % Predicted % 213  No results found for: "NITRICOXIDE"      Assessment & Plan:   COPD (chronic obstructive pulmonary disease) (HCC) Slow to resolve COPD exacerbation.  Patient has significant symptom burden.  Workup has been essentially unrevealing.  Chest x-ray without acute process.  Lab work with normal D-dimer and BNP.  He has been given 2 antibiotics and a steroid burst.  Now back to 10 mg at new baseline.  Will check  2D echo to rule out underlying cardiac source of increased shortness of breath and decreased activity tolerance.  Plan  Patient Instructions  Set up 2 D echo .  Mucinex DM twice daily as needed for cough and congestion Flutter valve Three times a day   Continue on Prednisone '10mg'$  daily  Continue on BREZTRI 2 puffs Twice daily  , rinse after use.  Albuterol Inhaler or neb as needed.  Continue on Oxygen 3-4 l/m  Saline nasal gel Twice daily   Follow up with Dr. Lamonte Sakai or Montie Swiderski NP in 6 week and As needed   Please contact office for sooner follow up if symptoms do not improve or worsen or seek emergency care      Chronic respiratory failure with hypoxia ([place]) Continue on oxygen to maintain O2 saturations greater than 88 to 90%.     Rexene Edison, NP 07/09/2022

## 2022-07-16 DIAGNOSIS — J449 Chronic obstructive pulmonary disease, unspecified: Secondary | ICD-10-CM | POA: Diagnosis not present

## 2022-07-16 DIAGNOSIS — J9611 Chronic respiratory failure with hypoxia: Secondary | ICD-10-CM | POA: Diagnosis not present

## 2022-07-25 ENCOUNTER — Encounter: Payer: Self-pay | Admitting: Emergency Medicine

## 2022-07-26 NOTE — Telephone Encounter (Signed)
Mychart message sent by pt: [name] [name].  [facility] (supporting Collene Gobble, MD)17 hours ago (3:15 PM)    Tammy suggested and our [facility] agrees that I should become acquainted with [name]. This requires your referring me to them . It would be [facility] in [place] at [phone]. Thanks.     Tammy, please advise if you are okay with the referral to Palliative Care being placed.

## 2022-08-01 NOTE — Telephone Encounter (Signed)
I think that is fine lets discuss it at office visit this month with Dr. Lamonte Sakai  and can place referral at that office visit .

## 2022-08-06 ENCOUNTER — Encounter: Payer: Self-pay | Admitting: Emergency Medicine

## 2022-08-06 ENCOUNTER — Other Ambulatory Visit: Payer: Self-pay | Admitting: Cardiology

## 2022-08-06 ENCOUNTER — Other Ambulatory Visit: Payer: Self-pay | Admitting: Urology

## 2022-08-06 ENCOUNTER — Ambulatory Visit (HOSPITAL_COMMUNITY): Payer: Medicare Other | Attending: Cardiology

## 2022-08-06 DIAGNOSIS — R0609 Other forms of dyspnea: Secondary | ICD-10-CM | POA: Insufficient documentation

## 2022-08-06 MED ORDER — BREZTRI AEROSPHERE 160-9-4.8 MCG/ACT IN AERO: 2.0000 | INHALATION_SPRAY | Freq: Two times a day (BID) | RESPIRATORY_TRACT | 6 refills | Status: DC

## 2022-08-07 ENCOUNTER — Telehealth: Payer: Self-pay | Admitting: Internal Medicine

## 2022-08-07 LAB — ECHOCARDIOGRAM COMPLETE: S' Lateral: 2 cm

## 2022-08-07 NOTE — Telephone Encounter (Signed)
I just faxed it.

## 2022-08-07 NOTE — Telephone Encounter (Signed)
Elijah from [facility] called in requesting a call back regarding orders for PT that was sent to PCP for approval . Please advise # [phone]

## 2022-08-08 NOTE — Telephone Encounter (Signed)
If he prefers the albuterol HFA as a rescue inhaler then we can use that instead of combivent prn,.    If he wants to have the combivent available to use q6h prn, then I am Ok with sending order for this

## 2022-08-08 NOTE — Telephone Encounter (Signed)
Dr. Lamonte Sakai, please see mychart messages sent by pt and advise.

## 2022-08-09 ENCOUNTER — Telehealth: Payer: Self-pay | Admitting: Primary Care

## 2022-08-09 MED ORDER — LEVOFLOXACIN 500 MG PO TABS: 500.0000 mg | ORAL_TABLET | Freq: Every day | ORAL | 0 refills | Status: DC

## 2022-08-09 MED ORDER — PREDNISONE 10 MG PO TABS: ORAL_TABLET | ORAL | 0 refills | Status: DC

## 2022-08-09 NOTE — Telephone Encounter (Signed)
Called and spoke with patient. He stated that he feels like he has been battling a cold for the past week. He has a productive cough but has not looked at the color of the phlegm. He is coughing at least 6-8 times per hour. He is simply swallowing the phlegm. He has also noticed an increase in SOB and wheezing especially with exertion. He confirmed that he is still using 3-4L of O2. Denied any fever or body aches.   When asked about any sick exposures, he stated that he lives at an assisted living facility where COVID is currently on the rise. I advised him it would be wise to get tested for covid to be the safe side.   He confirmed that he is still using his Breztri twice daily as well as his albuterol HFA and nebulizer solution as needed.   Pharmacy is [facility] in [place].   RB, can you please advise? Thanks!

## 2022-08-09 NOTE — Telephone Encounter (Signed)
Agree w covid testing - although he may be outside window to treatr Please give him levaquin '500mg'$  qd x 5 days Prednisone > Take '40mg'$  daily for 3 days, then '30mg'$  daily for 3 days, then '20mg'$  daily for 3 days, then back to '10mg'$  daily

## 2022-08-09 NOTE — Telephone Encounter (Signed)
Called and spoke with patient. He verbalized understanding. Medications have been sent to pharmacy.   Nothing further needed at time of call.

## 2022-08-09 NOTE — Telephone Encounter (Signed)
PT called wanting to speak w/Ms. Volanda Napoleon or nurse. SOB and copious amounts of phlegm. Offered appt already. He dcln and would like a CB. TY  [phone]

## 2022-08-11 ENCOUNTER — Encounter: Payer: Self-pay | Admitting: Emergency Medicine

## 2022-08-12 ENCOUNTER — Other Ambulatory Visit: Payer: Self-pay

## 2022-08-12 ENCOUNTER — Emergency Department: Payer: Medicare Other

## 2022-08-12 ENCOUNTER — Inpatient Hospital Stay
Admission: EM | Admit: 2022-08-12 | Discharge: 2022-08-20 | DRG: 202 | Disposition: A | Discharge status: Skilled Nursing Facility | Payer: Medicare Other | Source: Skilled Nursing Facility | Attending: Family Medicine | Admitting: Family Medicine

## 2022-08-12 DIAGNOSIS — I2510 Atherosclerotic heart disease of native coronary artery without angina pectoris: Secondary | ICD-10-CM | POA: Diagnosis present

## 2022-08-12 DIAGNOSIS — R2681 Unsteadiness on feet: Secondary | ICD-10-CM | POA: Diagnosis not present

## 2022-08-12 DIAGNOSIS — R042 Hemoptysis: Secondary | ICD-10-CM | POA: Diagnosis present

## 2022-08-12 DIAGNOSIS — J440 Chronic obstructive pulmonary disease with acute lower respiratory infection: Secondary | ICD-10-CM | POA: Diagnosis present

## 2022-08-12 DIAGNOSIS — I5A Non-ischemic myocardial injury (non-traumatic): Secondary | ICD-10-CM | POA: Diagnosis not present

## 2022-08-12 DIAGNOSIS — I272 Pulmonary hypertension, unspecified: Secondary | ICD-10-CM | POA: Diagnosis present

## 2022-08-12 DIAGNOSIS — I4892 Unspecified atrial flutter: Secondary | ICD-10-CM

## 2022-08-12 DIAGNOSIS — J449 Chronic obstructive pulmonary disease, unspecified: Secondary | ICD-10-CM | POA: Diagnosis not present

## 2022-08-12 DIAGNOSIS — I4819 Other persistent atrial fibrillation: Secondary | ICD-10-CM | POA: Diagnosis not present

## 2022-08-12 DIAGNOSIS — I4811 Longstanding persistent atrial fibrillation: Secondary | ICD-10-CM | POA: Diagnosis not present

## 2022-08-12 DIAGNOSIS — J441 Chronic obstructive pulmonary disease with (acute) exacerbation: Secondary | ICD-10-CM | POA: Diagnosis not present

## 2022-08-12 DIAGNOSIS — E785 Hyperlipidemia, unspecified: Secondary | ICD-10-CM | POA: Diagnosis present

## 2022-08-12 DIAGNOSIS — Z741 Need for assistance with personal care: Secondary | ICD-10-CM | POA: Diagnosis not present

## 2022-08-12 DIAGNOSIS — J205 Acute bronchitis due to respiratory syncytial virus: Secondary | ICD-10-CM | POA: Diagnosis present

## 2022-08-12 DIAGNOSIS — Z87891 Personal history of nicotine dependence: Secondary | ICD-10-CM | POA: Diagnosis not present

## 2022-08-12 DIAGNOSIS — Z1152 Encounter for screening for COVID-19: Secondary | ICD-10-CM

## 2022-08-12 DIAGNOSIS — E782 Mixed hyperlipidemia: Secondary | ICD-10-CM | POA: Diagnosis present

## 2022-08-12 DIAGNOSIS — I10 Essential (primary) hypertension: Secondary | ICD-10-CM | POA: Diagnosis not present

## 2022-08-12 DIAGNOSIS — Z888 Allergy status to other drugs, medicaments and biological substances status: Secondary | ICD-10-CM | POA: Diagnosis not present

## 2022-08-12 DIAGNOSIS — K219 Gastro-esophageal reflux disease without esophagitis: Secondary | ICD-10-CM | POA: Diagnosis present

## 2022-08-12 DIAGNOSIS — J439 Emphysema, unspecified: Secondary | ICD-10-CM | POA: Diagnosis not present

## 2022-08-12 DIAGNOSIS — E1151 Type 2 diabetes mellitus with diabetic peripheral angiopathy without gangrene: Secondary | ICD-10-CM | POA: Diagnosis not present

## 2022-08-12 DIAGNOSIS — J9621 Acute and chronic respiratory failure with hypoxia: Secondary | ICD-10-CM | POA: Diagnosis not present

## 2022-08-12 DIAGNOSIS — B338 Other specified viral diseases: Secondary | ICD-10-CM | POA: Diagnosis present

## 2022-08-12 DIAGNOSIS — Z955 Presence of coronary angioplasty implant and graft: Secondary | ICD-10-CM | POA: Diagnosis not present

## 2022-08-12 DIAGNOSIS — Z743 Need for continuous supervision: Secondary | ICD-10-CM | POA: Diagnosis not present

## 2022-08-12 DIAGNOSIS — Z9981 Dependence on supplemental oxygen: Secondary | ICD-10-CM

## 2022-08-12 DIAGNOSIS — R278 Other lack of coordination: Secondary | ICD-10-CM | POA: Diagnosis not present

## 2022-08-12 DIAGNOSIS — F419 Anxiety disorder, unspecified: Secondary | ICD-10-CM | POA: Diagnosis not present

## 2022-08-12 DIAGNOSIS — N400 Enlarged prostate without lower urinary tract symptoms: Secondary | ICD-10-CM | POA: Diagnosis present

## 2022-08-12 DIAGNOSIS — M6281 Muscle weakness (generalized): Secondary | ICD-10-CM | POA: Diagnosis not present

## 2022-08-12 DIAGNOSIS — R29898 Other symptoms and signs involving the musculoskeletal system: Secondary | ICD-10-CM | POA: Diagnosis not present

## 2022-08-12 DIAGNOSIS — Z8249 Family history of ischemic heart disease and other diseases of the circulatory system: Secondary | ICD-10-CM

## 2022-08-12 DIAGNOSIS — R0689 Other abnormalities of breathing: Secondary | ICD-10-CM | POA: Diagnosis not present

## 2022-08-12 DIAGNOSIS — Z79899 Other long term (current) drug therapy: Secondary | ICD-10-CM

## 2022-08-12 DIAGNOSIS — Z85828 Personal history of other malignant neoplasm of skin: Secondary | ICD-10-CM | POA: Diagnosis not present

## 2022-08-12 DIAGNOSIS — F1011 Alcohol abuse, in remission: Secondary | ICD-10-CM | POA: Diagnosis present

## 2022-08-12 DIAGNOSIS — Z7951 Long term (current) use of inhaled steroids: Secondary | ICD-10-CM

## 2022-08-12 DIAGNOSIS — Z7901 Long term (current) use of anticoagulants: Secondary | ICD-10-CM

## 2022-08-12 DIAGNOSIS — Z7401 Bed confinement status: Secondary | ICD-10-CM | POA: Diagnosis not present

## 2022-08-12 DIAGNOSIS — I25118 Atherosclerotic heart disease of native coronary artery with other forms of angina pectoris: Secondary | ICD-10-CM | POA: Diagnosis not present

## 2022-08-12 DIAGNOSIS — R6889 Other general symptoms and signs: Secondary | ICD-10-CM | POA: Diagnosis not present

## 2022-08-12 DIAGNOSIS — R001 Bradycardia, unspecified: Secondary | ICD-10-CM | POA: Diagnosis present

## 2022-08-12 DIAGNOSIS — Z820 Family history of epilepsy and other diseases of the nervous system: Secondary | ICD-10-CM

## 2022-08-12 DIAGNOSIS — I4891 Unspecified atrial fibrillation: Secondary | ICD-10-CM | POA: Diagnosis present

## 2022-08-12 DIAGNOSIS — I499 Cardiac arrhythmia, unspecified: Secondary | ICD-10-CM | POA: Diagnosis not present

## 2022-08-12 DIAGNOSIS — R0602 Shortness of breath: Secondary | ICD-10-CM | POA: Diagnosis not present

## 2022-08-12 DIAGNOSIS — J432 Centrilobular emphysema: Secondary | ICD-10-CM | POA: Diagnosis not present

## 2022-08-12 DIAGNOSIS — R5381 Other malaise: Secondary | ICD-10-CM | POA: Diagnosis not present

## 2022-08-12 DIAGNOSIS — J9611 Chronic respiratory failure with hypoxia: Secondary | ICD-10-CM | POA: Diagnosis not present

## 2022-08-12 DIAGNOSIS — Z7902 Long term (current) use of antithrombotics/antiplatelets: Secondary | ICD-10-CM

## 2022-08-12 DIAGNOSIS — K5900 Constipation, unspecified: Secondary | ICD-10-CM | POA: Diagnosis present

## 2022-08-12 DIAGNOSIS — Z8546 Personal history of malignant neoplasm of prostate: Secondary | ICD-10-CM

## 2022-08-12 LAB — CBC WITH DIFFERENTIAL/PLATELET
Abs Immature Granulocytes: 0.21 10*3/uL — ABNORMAL HIGH (ref 0.00–0.07)
Basophils Absolute: 0 10*3/uL (ref 0.0–0.1)
Basophils Relative: 0 %
Eosinophils Absolute: 0.2 10*3/uL (ref 0.0–0.5)
Eosinophils Relative: 1 %
HCT: 39.7 % (ref 39.0–52.0)
Hemoglobin: 12.5 g/dL — ABNORMAL LOW (ref 13.0–17.0)
Immature Granulocytes: 2 %
Lymphocytes Relative: 12 %
Lymphs Abs: 1.6 10*3/uL (ref 0.7–4.0)
MCH: 31.1 pg (ref 26.0–34.0)
MCHC: 31.5 g/dL (ref 30.0–36.0)
MCV: 98.8 fL (ref 80.0–100.0)
Monocytes Absolute: 1.1 10*3/uL — ABNORMAL HIGH (ref 0.1–1.0)
Monocytes Relative: 8 %
Neutro Abs: 10.6 10*3/uL — ABNORMAL HIGH (ref 1.7–7.7)
Neutrophils Relative %: 77 %
Platelets: 317 10*3/uL (ref 150–400)
RBC: 4.02 MIL/uL — ABNORMAL LOW (ref 4.22–5.81)
RDW: 14.9 % (ref 11.5–15.5)
WBC: 13.6 10*3/uL — ABNORMAL HIGH (ref 4.0–10.5)
nRBC: 0 % (ref 0.0–0.2)

## 2022-08-12 LAB — BLOOD GAS, VENOUS
Acid-Base Excess: 6.6 mmol/L — ABNORMAL HIGH (ref 0.0–2.0)
Bicarbonate: 35 mmol/L — ABNORMAL HIGH (ref 20.0–28.0)
Delivery systems: POSITIVE
FIO2: 60 %
O2 Saturation: 60.2 %
Patient temperature: 37
pCO2, Ven: 68 mmHg — ABNORMAL HIGH (ref 44–60)
pH, Ven: 7.32 (ref 7.25–7.43)
pO2, Ven: 43 mmHg (ref 32–45)

## 2022-08-12 LAB — BRAIN NATRIURETIC PEPTIDE: B Natriuretic Peptide: 61 pg/mL (ref 0.0–100.0)

## 2022-08-12 LAB — COMPREHENSIVE METABOLIC PANEL
ALT: 14 U/L (ref 0–44)
AST: 22 U/L (ref 15–41)
Albumin: 3.6 g/dL (ref 3.5–5.0)
Alkaline Phosphatase: 50 U/L (ref 38–126)
Anion gap: 8 (ref 5–15)
BUN: 25 mg/dL — ABNORMAL HIGH (ref 8–23)
CO2: 31 mmol/L (ref 22–32)
Calcium: 8.7 mg/dL — ABNORMAL LOW (ref 8.9–10.3)
Chloride: 101 mmol/L (ref 98–111)
Creatinine, Ser: 1.21 mg/dL (ref 0.61–1.24)
GFR, Estimated: >60 mL/min (ref >60)
Glucose, Bld: 100 mg/dL — ABNORMAL HIGH (ref 70–99)
Potassium: 3.8 mmol/L (ref 3.5–5.1)
Sodium: 140 mmol/L (ref 135–145)
Total Bilirubin: 0.9 mg/dL (ref 0.3–1.2)
Total Protein: 6.4 g/dL — ABNORMAL LOW (ref 6.5–8.1)

## 2022-08-12 LAB — TROPONIN I (HIGH SENSITIVITY)
Troponin I (High Sensitivity): 31 ng/L — ABNORMAL HIGH (ref <18)
Troponin I (High Sensitivity): 29 ng/L — ABNORMAL HIGH (ref <18)
Troponin I (High Sensitivity): 25 ng/L — ABNORMAL HIGH (ref <18)

## 2022-08-12 LAB — EXPECTORATED SPUTUM ASSESSMENT W GRAM STAIN, RFLX TO RESP C

## 2022-08-12 LAB — PROTIME-INR
INR: 1.1 (ref 0.8–1.2)
Prothrombin Time: 13.9 seconds (ref 11.4–15.2)

## 2022-08-12 LAB — RESP PANEL BY RT-PCR (RSV, FLU A&B, COVID)  RVPGX2
Influenza A by PCR: NEGATIVE
Influenza B by PCR: NEGATIVE
Resp Syncytial Virus by PCR: POSITIVE — AB
SARS Coronavirus 2 by RT PCR: NEGATIVE

## 2022-08-12 LAB — LACTIC ACID, PLASMA
Lactic Acid, Venous: 1.7 mmol/L (ref 0.5–1.9)
Lactic Acid, Venous: 1.4 mmol/L (ref 0.5–1.9)

## 2022-08-12 LAB — MAGNESIUM: Magnesium: 2 mg/dL (ref 1.7–2.4)

## 2022-08-12 LAB — TSH: TSH: 0.7 u[IU]/mL (ref 0.350–4.500)

## 2022-08-12 LAB — APTT: aPTT: 29 seconds (ref 24–36)

## 2022-08-12 MED ORDER — IPRATROPIUM-ALBUTEROL 0.5-2.5 (3) MG/3ML IN SOLN: 3.0000 mL | Freq: Once | RESPIRATORY_TRACT | Status: DC

## 2022-08-12 MED ORDER — TAMSULOSIN HCL 0.4 MG PO CAPS
0.4000 mg | ORAL_CAPSULE | Freq: Every day | ORAL | Status: DC
  Administered 2022-08-12 – 2022-08-20 (×9): 0.4 mg via ORAL
  Filled 2022-08-12 (×9): qty 1

## 2022-08-12 MED ORDER — DILTIAZEM HCL 25 MG/5ML IV SOLN
5.0000 mg | Freq: Once | INTRAVENOUS | Status: AC
  Administered 2022-08-12: 5 mg via INTRAVENOUS
  Filled 2022-08-12: qty 5

## 2022-08-12 MED ORDER — ENOXAPARIN SODIUM 40 MG/0.4ML IJ SOSY: 40.0000 mg | PREFILLED_SYRINGE | INTRAMUSCULAR | Status: DC

## 2022-08-12 MED ORDER — ALBUTEROL SULFATE (2.5 MG/3ML) 0.083% IN NEBU
2.5000 mg | INHALATION_SOLUTION | RESPIRATORY_TRACT | Status: DC | PRN
  Administered 2022-08-13 – 2022-08-20 (×3): 2.5 mg via RESPIRATORY_TRACT
  Filled 2022-08-12 (×4): qty 3

## 2022-08-12 MED ORDER — DILTIAZEM HCL 25 MG/5ML IV SOLN: 5.0000 mg | Freq: Once | INTRAVENOUS | Status: DC

## 2022-08-12 MED ORDER — IPRATROPIUM BROMIDE 0.02 % IN SOLN
0.5000 mg | Freq: Four times a day (QID) | RESPIRATORY_TRACT | Status: DC
  Administered 2022-08-12 – 2022-08-18 (×24): 0.5 mg via RESPIRATORY_TRACT
  Filled 2022-08-12 (×24): qty 2.5

## 2022-08-12 MED ORDER — HEPARIN BOLUS VIA INFUSION
4000.0000 [IU] | Freq: Once | INTRAVENOUS | Status: AC
  Administered 2022-08-12: 4000 [IU] via INTRAVENOUS
  Filled 2022-08-12: qty 4000

## 2022-08-12 MED ORDER — CLOPIDOGREL BISULFATE 75 MG PO TABS
75.0000 mg | ORAL_TABLET | Freq: Every day | ORAL | Status: DC
  Administered 2022-08-12 – 2022-08-13 (×2): 75 mg via ORAL
  Filled 2022-08-12 (×2): qty 1

## 2022-08-12 MED ORDER — IRBESARTAN 75 MG PO TABS
75.0000 mg | ORAL_TABLET | Freq: Every day | ORAL | Status: DC
  Filled 2022-08-12: qty 1

## 2022-08-12 MED ORDER — DILTIAZEM HCL-DEXTROSE 125-5 MG/125ML-% IV SOLN (PREMIX)
5.0000 mg/h | INTRAVENOUS | Status: DC
  Filled 2022-08-12: qty 125

## 2022-08-12 MED ORDER — HEPARIN (PORCINE) 25000 UT/250ML-% IV SOLN
850.0000 [IU]/h | INTRAVENOUS | Status: DC
  Administered 2022-08-12: 1000 [IU]/h via INTRAVENOUS
  Filled 2022-08-12 (×2): qty 250

## 2022-08-12 MED ORDER — LEVALBUTEROL HCL 0.63 MG/3ML IN NEBU
0.6300 mg | INHALATION_SOLUTION | Freq: Four times a day (QID) | RESPIRATORY_TRACT | Status: DC
  Administered 2022-08-12 – 2022-08-18 (×25): 0.63 mg via RESPIRATORY_TRACT
  Filled 2022-08-12 (×25): qty 3

## 2022-08-12 MED ORDER — ACETAMINOPHEN 325 MG PO TABS
650.0000 mg | ORAL_TABLET | Freq: Four times a day (QID) | ORAL | Status: DC | PRN
  Administered 2022-08-15 – 2022-08-19 (×4): 650 mg via ORAL
  Filled 2022-08-12 (×4): qty 2

## 2022-08-12 MED ORDER — MAGNESIUM SULFATE 2 GM/50ML IV SOLN
2.0000 g | Freq: Once | INTRAVENOUS | Status: AC
  Administered 2022-08-12: 2 g via INTRAVENOUS
  Filled 2022-08-12: qty 50

## 2022-08-12 MED ORDER — ROSUVASTATIN CALCIUM 10 MG PO TABS
20.0000 mg | ORAL_TABLET | Freq: Every day | ORAL | Status: DC
  Administered 2022-08-12 – 2022-08-20 (×9): 20 mg via ORAL
  Filled 2022-08-12: qty 1
  Filled 2022-08-12 (×2): qty 2
  Filled 2022-08-12: qty 1
  Filled 2022-08-12 (×2): qty 2
  Filled 2022-08-12: qty 1
  Filled 2022-08-12 (×2): qty 2

## 2022-08-12 MED ORDER — ONDANSETRON HCL 4 MG/2ML IJ SOLN: 4.0000 mg | Freq: Three times a day (TID) | INTRAMUSCULAR | Status: DC | PRN

## 2022-08-12 MED ORDER — METOPROLOL TARTRATE 25 MG PO TABS
25.0000 mg | ORAL_TABLET | Freq: Two times a day (BID) | ORAL | Status: DC
  Administered 2022-08-12 – 2022-08-13 (×2): 25 mg via ORAL
  Filled 2022-08-12 (×2): qty 1

## 2022-08-12 MED ORDER — GABAPENTIN 100 MG PO CAPS
100.0000 mg | ORAL_CAPSULE | Freq: Two times a day (BID) | ORAL | Status: DC
  Administered 2022-08-12 – 2022-08-20 (×16): 100 mg via ORAL
  Filled 2022-08-12 (×16): qty 1

## 2022-08-12 MED ORDER — METHYLPREDNISOLONE SODIUM SUCC 125 MG IJ SOLR
80.0000 mg | INTRAMUSCULAR | Status: DC
  Administered 2022-08-12 – 2022-08-15 (×4): 80 mg via INTRAVENOUS
  Filled 2022-08-12 (×4): qty 2

## 2022-08-12 MED ORDER — METHYLPREDNISOLONE SODIUM SUCC 125 MG IJ SOLR
125.0000 mg | Freq: Once | INTRAMUSCULAR | Status: AC
  Administered 2022-08-12: 125 mg via INTRAVENOUS
  Filled 2022-08-12: qty 2

## 2022-08-12 MED ORDER — POLYETHYLENE GLYCOL 3350 17 G PO PACK
17.0000 g | PACK | Freq: Every day | ORAL | Status: DC | PRN
  Administered 2022-08-14: 17 g via ORAL
  Filled 2022-08-12: qty 1

## 2022-08-12 MED ORDER — FINASTERIDE 5 MG PO TABS
5.0000 mg | ORAL_TABLET | Freq: Every day | ORAL | Status: DC
  Administered 2022-08-12 – 2022-08-20 (×9): 5 mg via ORAL
  Filled 2022-08-12 (×9): qty 1

## 2022-08-12 MED ORDER — SODIUM CHLORIDE 0.9 % IV BOLUS
500.0000 mL | Freq: Once | INTRAVENOUS | Status: AC
  Administered 2022-08-12: 500 mL via INTRAVENOUS

## 2022-08-12 MED ORDER — ALBUTEROL SULFATE (2.5 MG/3ML) 0.083% IN NEBU: 2.5000 mg | INHALATION_SOLUTION | Freq: Once | RESPIRATORY_TRACT | Status: DC

## 2022-08-12 MED ORDER — HYDRALAZINE HCL 20 MG/ML IJ SOLN: 5.0000 mg | INTRAMUSCULAR | Status: DC | PRN

## 2022-08-12 MED ORDER — DM-GUAIFENESIN ER 30-600 MG PO TB12
1.0000 | ORAL_TABLET | Freq: Two times a day (BID) | ORAL | Status: DC | PRN
  Administered 2022-08-17: 1 via ORAL
  Filled 2022-08-12 (×2): qty 1

## 2022-08-12 MED ORDER — METOPROLOL TARTRATE 5 MG/5ML IV SOLN: 5.0000 mg | INTRAVENOUS | Status: DC | PRN

## 2022-08-12 MED ORDER — PANTOPRAZOLE SODIUM 40 MG PO TBEC
40.0000 mg | DELAYED_RELEASE_TABLET | Freq: Every day | ORAL | Status: DC
  Administered 2022-08-12 – 2022-08-20 (×9): 40 mg via ORAL
  Filled 2022-08-12 (×9): qty 1

## 2022-08-12 MED ORDER — DILTIAZEM HCL-DEXTROSE 125-5 MG/125ML-% IV SOLN (PREMIX)
5.0000 mg/h | INTRAVENOUS | Status: DC
  Administered 2022-08-12 – 2022-08-14 (×3): 5 mg/h via INTRAVENOUS
  Filled 2022-08-12 (×2): qty 125

## 2022-08-12 MED ORDER — CLONAZEPAM 0.5 MG PO TABS
0.5000 mg | ORAL_TABLET | Freq: Every day | ORAL | Status: DC | PRN
  Administered 2022-08-17 – 2022-08-20 (×4): 0.5 mg via ORAL
  Filled 2022-08-12 (×4): qty 1

## 2022-08-12 MED ORDER — MELATONIN 5 MG PO TABS
5.0000 mg | ORAL_TABLET | Freq: Every day | ORAL | Status: DC
  Administered 2022-08-12 – 2022-08-19 (×8): 5 mg via ORAL
  Filled 2022-08-12 (×8): qty 1

## 2022-08-12 MED ORDER — METOPROLOL TARTRATE 5 MG/5ML IV SOLN
5.0000 mg | Freq: Once | INTRAVENOUS | Status: AC
  Administered 2022-08-12: 5 mg via INTRAVENOUS
  Filled 2022-08-12: qty 5

## 2022-08-12 MED ORDER — NITROGLYCERIN 0.4 MG SL SUBL: 0.4000 mg | SUBLINGUAL_TABLET | SUBLINGUAL | Status: DC | PRN

## 2022-08-12 MED ORDER — LEVALBUTEROL HCL 1.25 MG/0.5ML IN NEBU
1.2500 mg | INHALATION_SOLUTION | Freq: Once | RESPIRATORY_TRACT | Status: AC
  Administered 2022-08-12: 1.25 mg via RESPIRATORY_TRACT
  Filled 2022-08-12: qty 0.5

## 2022-08-12 MED ORDER — IPRATROPIUM-ALBUTEROL 0.5-2.5 (3) MG/3ML IN SOLN: 3.0000 mL | RESPIRATORY_TRACT | Status: DC

## 2022-08-12 NOTE — Consult Note (Signed)
ANTICOAGULATION CONSULT NOTE - Initial Consult  Pharmacy Consult for heparin infusion Indication: atrial fibrillation  Allergies  Allergen Reactions   Flagyl [Metronidazole] Other (See Comments)    Other reaction(s): skin reaction    Patient Measurements: Weight: 72.6 kg (160 lb 0.9 oz) (recorded 06/2022) Heparin Dosing Weight: 72.6 kg  Vital Signs: BP: 106/61 (01/14 1530) Pulse Rate: 60 (01/14 1530)  Labs: Recent Labs    08/12/22 1224 08/12/22 1438  HGB 12.5*  --   HCT 39.7  --   PLT 317  --   CREATININE 1.21  --   TROPONINIHS 31* 29*    Estimated Creatinine Clearance: 49.2 mL/min (by C-G formula based on SCr of 1.21 mg/dL).   Medical History: Past Medical History:  Diagnosis Date   Abdominal discomfort 12/07/2020   Acquired trigger finger of right middle finger 02/04/2020   Acute prostatitis 04/08/2018   Alcohol abuse    Alcohol dependence ([place]) 12/07/2020   Anal or rectal pain 04/08/2018   Annual physical exam 12/07/2020   Anorectal disorder 12/07/2020   Arthritis    Atherosclerotic heart disease of native coronary artery without angina pectoris 04/08/2018   Benign prostatic hyperplasia with lower urinary tract symptoms 12/07/2020   BPH with elevated PSA    Cancer ([place])    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   Chronic obstructive pulmonary disease with (acute) exacerbation ([place]) 12/07/2020   Chronic respiratory failure with hypoxia ([place]) 08/10/2019   Chronic sinusitis 12/07/2020   Cigarette nicotine dependence, uncomplicated 02/72/5366   Coagulation disorder ([place]) 01/13/2019   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   Contracture of palmar fascia 12/07/2020   COPD (chronic obstructive pulmonary disease) ([place])    Corn of toe 12/07/2020   Corns and callosities 04/09/2018   Coronary artery disease    2019 with stents   Coronary atherosclerosis 05/07/2018   Degenerative disc disease, 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    Degenerative disc disease, 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   Diarrhea 04/09/2018   Double vision with both eyes open 12/07/2020   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   Dysphagia 08/14/2018   Dyspnea    ED (erectile dysfunction)    ED (erectile dysfunction) of organic origin 12/07/2020   Elevated prostate specific antigen (PSA) 04/08/2018   Elevated PSA    Encounter for screening for other disorder 12/07/2020   Enlarged prostate without lower urinary tract symptoms (luts) 04/09/2018   Enthesopathy 12/07/2020   Essential (primary) hypertension 04/08/2018   Ex-smoker 04/09/2018   Exertional dyspnea 04/02/2018   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   Flatulence 04/08/2018   Gastro-esophageal reflux disease without esophagitis 04/08/2018   GERD (gastroesophageal reflux disease)    History of acute otitis externa 08/14/2018   Hyperlipidemia    Hypertension    Hypoxemia 12/07/2020   Kidney cysts    Left knee pain 12/07/2020   Low back pain 04/08/2018   Male erectile dysfunction 04/09/2018   Mixed hyperlipidemia 04/08/2018   Nocturia more than twice per night 04/09/2018   Osteoarthritis of first carpometacarpal joint 04/08/2018   Osteoarthritis of knee 12/07/2020   Other long term (current) drug therapy 12/07/2020   Pain due to onychomycosis of toenail of left foot 07/20/2019   Pain in joint 04/09/2018   Pain in knee 04/09/2018   Pain in unspecified knee 12/07/2020   Pain of finger 04/09/2018   Palpitations    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[place]) 12/07/2020   Personal history of colonic polyps 12/07/2020   Pneumonia  Pneumothorax 04/12/2020   Polypharmacy 04/09/2018   Porokeratosis 01/13/2019   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[place])    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[place]) 12/07/2020   Pulmonary nodules 06/08/2016   Pure hypercholesterolemia 12/07/2020   Renal cyst 12/07/2020   Sleep disorder 04/08/2018   Status post bronchoscopy 04/12/2020   Tear of rotator cuff 04/09/2018   Tobacco user 41/28/7867    Uncomplicated alcohol dependence ([place]) 04/09/2018   Unspecified rotator cuff tear or rupture of unspecified shoulder, not specified as traumatic 12/07/2020   Unspecified tear of unspecified meniscus, current injury, unspecified knee, initial encounter 12/07/2020   Visual disturbance 12/07/2020   Vitamin D deficiency 04/08/2018    Medications:  No prior anticoagulation noted  Assessment: 82 y.o. male with medical history significant of HTN, CAD s/p stent presents with acute respiratory distress. Found to have new onset Afib with RVR.  Baseline labs ordered  Goal of Therapy:  Heparin level 0.3-0.7 units/ml Monitor platelets by anticoagulation protocol: Yes   Plan:  Give 4000 units bolus x 1 Start heparin infusion at 1000 units/hr Check anti-Xa level in 8 hours  Continue to monitor H&H and platelets  Dorothe Pea, PharmD, BCPS Clinical Pharmacist   08/12/2022,3:40 PM

## 2022-08-12 NOTE — ED Notes (Signed)
Attempted to call report to ICU and was told that the pt was rejected due to night time staffing.

## 2022-08-12 NOTE — H&P (Signed)
History and Physical    [name] YPP:[id number] DOB: [birth year] DOA: 08/12/2022  Referring MD/NP/PA:   PCP: [name], MD   Patient coming from:  The patient is coming from SNF.        Chief Complaint: SOB  HPI: [name] [name]. is a 82 y.o. 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  Patient states that he has cough and shortness of breath for more than 3 days, which has been progressively worsening.  He coughs up yellow-colored sputum.  Denies fever and chills.  He also reports chest pressure.  Patient was found to have severe respiratory distress and severe tachycardia with heart rates up to 180s.  Found to have new onset atrial fibrillation.  He cannot speak in full sentence, using accessory muscle for breathing, initially started on CPAP but still has respiratory distress.  BiPAP is started in the ED.  Patient does not have nausea, vomiting, diarrhea or abdominal pain.  No symptoms of UTI.  He denies any recent fall or head injury.  No rectal bleeding or dark stool.  Data reviewed independently and ED Course: pt was found to have positive PCR for RSV, BNP 61, lactic acid normal 1.7, WBC 13.6, GFR> 60, blood pressure 130/59, RR 28.  ABG with pH 7.32, CO2 68, O2 43.  Chest x-ray showed COPD without infiltration.  Patient is admitted to stepdown as inpatient.  EKG: I have personally reviewed.  Atrial fibrillation, heart rate 181, early R wave progression, low voltage in lead I   Review of Systems:   General: no fevers, chills, no body weight gain, has fatigue HEENT: no blurry vision, hearing changes or sore throat Respiratory: has dyspnea, coughing, wheezing CV: has chest pressure,  palpitations GI: no nausea, vomiting, abdominal pain, diarrhea, constipation GU: no dysuria, burning on urination, increased urinary frequency,  hematuria  Ext: no leg edema Neuro: no unilateral weakness, numbness, or tingling, no vision change or hearing loss Skin: no rash, no skin tear. MSK: No muscle spasm, no deformity, no limitation of range of movement in spin Heme: No easy bruising.  Travel history: No recent long distant travel.   Allergy:  Allergies  Allergen Reactions   Flagyl [Metronidazole] Other (See Comments)    Other reaction(s): skin reaction    Past Medical History:  Diagnosis Date   Abdominal discomfort 12/07/2020   Acquired trigger finger of right middle finger 02/04/2020   Acute prostatitis 04/08/2018   Alcohol abuse    Alcohol dependence ([place]) 12/07/2020   Anal or rectal pain 04/08/2018   Annual physical exam 12/07/2020   Anorectal disorder 12/07/2020   Arthritis    Atherosclerotic heart disease of native coronary artery without angina pectoris 04/08/2018   Benign prostatic hyperplasia with lower urinary tract symptoms 12/07/2020   BPH with elevated PSA    Cancer ([place])    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   Chronic obstructive pulmonary disease with (acute) exacerbation (HCC) 12/07/2020   Chronic respiratory failure with hypoxia ([place]) 08/10/2019   Chronic sinusitis 12/07/2020   Cigarette nicotine dependence, uncomplicated 45/80/9983   Coagulation disorder ([place]) 01/13/2019   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   Contracture of palmar fascia 12/07/2020   COPD (chronic obstructive pulmonary disease) ([place])    Corn of toe 12/07/2020   Corns and callosities 04/09/2018  Coronary artery disease    2019 with stents   Coronary atherosclerosis 05/07/2018   Degenerative disc disease, 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   Degenerative disc disease, 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   Diarrhea 04/09/2018   Double vision with both eyes open 12/07/2020   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   Dysphagia 08/14/2018   Dyspnea    ED (erectile dysfunction)    ED (erectile dysfunction) of  organic origin 12/07/2020   Elevated prostate specific antigen (PSA) 04/08/2018   Elevated PSA    Encounter for screening for other disorder 12/07/2020   Enlarged prostate without lower urinary tract symptoms (luts) 04/09/2018   Enthesopathy 12/07/2020   Essential (primary) hypertension 04/08/2018   Ex-smoker 04/09/2018   Exertional dyspnea 04/02/2018   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   Flatulence 04/08/2018   Gastro-esophageal reflux disease without esophagitis 04/08/2018   GERD (gastroesophageal reflux disease)    History of acute otitis externa 08/14/2018   Hyperlipidemia    Hypertension    Hypoxemia 12/07/2020   Kidney cysts    Left knee pain 12/07/2020   Low back pain 04/08/2018   Male erectile dysfunction 04/09/2018   Mixed hyperlipidemia 04/08/2018   Nocturia more than twice per night 04/09/2018   Osteoarthritis of first carpometacarpal joint 04/08/2018   Osteoarthritis of knee 12/07/2020   Other long term (current) drug therapy 12/07/2020   Pain due to onychomycosis of toenail of left foot 07/20/2019   Pain in joint 04/09/2018   Pain in knee 04/09/2018   Pain in unspecified knee 12/07/2020   Pain of finger 04/09/2018   Palpitations    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[place]) 12/07/2020   Personal history of colonic polyps 12/07/2020   Pneumonia    Pneumothorax 04/12/2020   Polypharmacy 04/09/2018   Porokeratosis 01/13/2019   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[place])    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   Pulmonary nodules 06/08/2016   Pure hypercholesterolemia 12/07/2020   Renal cyst 12/07/2020   Sleep disorder 04/08/2018   Status post bronchoscopy 04/12/2020   Tear of rotator cuff 04/09/2018   Tobacco user 39/53/2023   Uncomplicated alcohol dependence ([place]) 04/09/2018   Unspecified rotator cuff tear or rupture of unspecified shoulder, not specified as traumatic 12/07/2020   Unspecified tear of unspecified meniscus, current injury, unspecified knee, initial encounter  12/07/2020   Visual disturbance 12/07/2020   Vitamin D deficiency 04/08/2018    Past Surgical History:  Procedure Laterality Date   BRONCHIAL BIOPSY  10/13/2019   Procedure: BRONCHIAL BIOPSIES;  Surgeon: Collene Gobble, MD;  Location: [place];  Service: Pulmonary;;   BRONCHIAL BIOPSY  04/12/2020   Procedure: BRONCHIAL BIOPSIES;  Surgeon: Collene Gobble, MD;  Location: [place];  Service: Pulmonary;;   BRONCHIAL BRUSHINGS  10/13/2019   Procedure: BRONCHIAL BRUSHINGS;  Surgeon: Collene Gobble, MD;  Location: [facility] ENDOSCOPY;  Service: Pulmonary;;   BRONCHIAL BRUSHINGS  04/12/2020   Procedure: BRONCHIAL BRUSHINGS;  Surgeon: Collene Gobble, MD;  Location: [facility] ENDOSCOPY;  Service: Pulmonary;;   BRONCHIAL NEEDLE ASPIRATION BIOPSY  10/13/2019   Procedure: BRONCHIAL NEEDLE ASPIRATION BIOPSIES;  Surgeon: Collene Gobble, MD;  Location: [facility] ENDOSCOPY;  Service: Pulmonary;;   BRONCHIAL NEEDLE ASPIRATION BIOPSY  04/12/2020   Procedure: BRONCHIAL NEEDLE ASPIRATION BIOPSIES;  Surgeon: Collene Gobble, MD;  Location: [facility] END[facility]COPY;  Service: Pulmonary;;   BRONCHIAL WASHINGS  10/13/2019   Procedure: BRONCHIAL WASHINGS;  Surgeon: Collene Gobble, MD;  Location: [facility] ENDOSCOPY;  Service: Pulmonary;;   BRONCHIAL WASHINGS  04/12/2020   Procedure: BRONCHIAL WASHINGS;  Surgeon:  Collene Gobble, MD;  Location: [facility] ENDOSCOPY;  Service: Pulmonary;;   CARDIAC CATHETERIZATION  2002   50% RCA   CATARACT EXTRACTION, BILATERAL     CORONARY STENT INTERVENTION N/A 04/15/2018   Procedure: CORONARY STENT INTERVENTION;  Surgeon: Jettie Booze, MD;  Location: [employer] CV LAB;  Service: Cardiovascular;  Laterality: N/A;  om1   EYE SURGERY     KNEE ARTHROSCOPY     LEFT HEART CATH AND CORONARY ANGIOGRAPHY N/A 04/15/2018   Procedure: LEFT HEART CATH AND CORONARY ANGIOGRAPHY;  Surgeon: Jettie Booze, MD;  Location: [place] CV LAB;  Service: Cardiovascular;  Laterality: N/A;   MULTIPLE TOOTH EXTRACTIONS      TONSILLECTOMY     VIDEO BRONCHOSCOPY  04/12/2020   VIDEO BRONCHOSCOPY WITH ENDOBRONCHIAL NAVIGATION N/A 07/26/2016   Procedure: VIDEO BRONCHOSCOPY WITH ENDOBRONCHIAL NAVIGATION;  Surgeon: Collene Gobble, MD;  Location: [place];  Service: Thoracic;  Laterality: N/A;   VIDEO BRONCHOSCOPY WITH ENDOBRONCHIAL NAVIGATION N/A 10/13/2019   Procedure: [facility] AND VIDEO BRONCHOSCOPY WITH ENDOBRONCHIAL NAVIGATION;  Surgeon: Collene Gobble, MD;  Location: [place] ENDOSCOPY;  Service: Pulmonary;  Laterality: N/A;   VIDEO BRONCHOSCOPY WITH ENDOBRONCHIAL NAVIGATION N/A 04/12/2020   Procedure: VIDEO BRONCHOSCOPY WITH ENDOBRONCHIAL NAVIGATION;  Surgeon: Collene Gobble, MD;  Location: [place] ENDOSCOPY;  Service: Pulmonary;  Laterality: N/A;    Social History:  reports that he quit smoking about 2 years ago. His smoking use included cigarettes. He started smoking about 70 years ago. He has a 50.25 pack-year smoking history. He has never been exposed to tobacco smoke. He has never used smokeless tobacco. He reports that he does not drink alcohol and does not use drugs.  Family History:  Family History  Problem Relation Age of Onset   Hypertension Mother    Alzheimer's disease Mother      Prior to Admission medications   Medication Sig Start Date End Date Taking? Authorizing Provider  albuterol (PROVENTIL) (2.5 MG/3ML) 0.083% nebulizer solution Take 3 mLs (2.5 mg total) by nebulization every 6 (six) hours as needed for wheezing or shortness of breath. 06/18/22   Parrett, Fonnie Mu, NP  albuterol (VENTOLIN HFA) 108 (90 Base) MCG/ACT inhaler Inhale 2 puffs into the lungs 3 (three) times daily as needed for wheezing or shortness of breath. 11/10/20   [provider]  ammonium lactate (AMLACTIN) 12 % cream Apply topically at bedtime. 11/06/21   [provider]  Budeson-Glycopyrrol-Formoterol (BREZTRI AEROSPHERE) 160-9-4.8 MCG/ACT AERO Inhale 2 puffs into the lungs 2 (two) times daily. 08/06/22   Collene Gobble, MD   clonazePAM (KLONOPIN) 0.5 MG tablet Take 0.5 mg by mouth daily as needed for anxiety. 01/10/21   [provider]  clopidogrel (PLAVIX) 75 MG tablet Take 1 tablet (75 mg total) by mouth daily. 08/06/22   Revankar, Reita Cliche, MD  finasteride (PROSCAR) 5 MG tablet Take 5 mg by mouth daily. 01/13/22   [provider]  furosemide (LASIX) 20 MG tablet Take 20 mg by mouth daily. 04/18/22   [provider]  gabapentin (NEURONTIN) 100 MG capsule Take 100 mg by mouth 2 (two) times daily. 07/26/21   [provider]  ketoconazole (NIZORAL) 2 % cream Apply topically 2 (two) times daily. 02/22/22   [provider]  levofloxacin (LEVAQUIN) 500 MG tablet Take 1 tablet (500 mg total) by mouth daily. 08/09/22   Collene Gobble, MD  Melatonin 5 MG CAPS Take 5 mg by mouth at bedtime.    [provider]  nitroGLYCERIN (NITROSTAT) 0.4 MG SL tablet Place 1 tablet (0.4 mg total) under the tongue every 5 (five) minutes as needed for chest pain. 07/13/21   Revankar, Reita Cliche, MD  OXYGEN Inhale into the lungs. 2-3 liters upon exertion and 2 liters continuous at night.    [provider]  pantoprazole (PROTONIX) 40 MG tablet Take 40 mg by mouth daily. 01/10/21   [provider]  polyethylene glycol powder (GLYCOLAX/MIRALAX) 17 GM/SCOOP powder Take 255 g by mouth daily. Patient taking differently: Take 1 Container by mouth daily as needed. 03/20/22   Daryel November, MD  predniSONE (DELTASONE) 10 MG tablet Take 10 mg by mouth daily with breakfast.    [provider]  predniSONE (DELTASONE) 10 MG tablet Take 4 tablets (40 mg total) by mouth daily with breakfast for 3 days, THEN 3 tablets (30 mg total) daily with breakfast for 3 days, THEN 2 tablets (20 mg total) daily with breakfast for 3 days. 08/09/22 08/18/22  Collene Gobble, MD  rosuvastatin (CRESTOR) 20 MG tablet Take 1 tablet (20 mg total) by mouth at bedtime. 11/27/21   Revankar, Reita Cliche, MD   tamsulosin (FLOMAX) 0.4 MG CAPS capsule TAKE 1 CAPSULE BY MOUTH EVERY DAY 05/17/22   Stoioff, Ronda Fairly, MD  valsartan (DIOVAN) 80 MG tablet Take 80 mg by mouth daily. 07/12/21   [provider]    Physical Exam: Vitals:   08/12/22 1616 08/12/22 1620 08/12/22 1630 08/12/22 1634  BP:   135/65   Pulse: 61 70 60 (!) 152  Resp: '14 19 20 17  '$ SpO2: 100% 100% 100% 100%  Weight:       General: Not in acute distress HEENT:       Eyes: PERRL, EOMI, no scleral icterus.       ENT: No discharge from the ears and nose, no pharynx injection, no tonsillar enlargement.        Neck: No JVD, no bruit, no mass felt. Heme: No neck lymph node enlargement. Cardiac: S1/S2, irregularly irregular rhythm, no gallops or rubs. Respiratory: has wheezing bilaterally GI: Soft, nondistended, nontender, no rebound pain, no organomegaly, BS present. GU: No hematuria Ext: No pitting leg edema bilaterally. 1+DP/PT pulse bilaterally. Musculoskeletal: No joint deformities, No joint redness or warmth, no limitation of ROM in spin. Skin: No rashes.  Neuro: Alert, oriented X3, cranial nerves II-XII grossly intact, moves all extremities normally. Psych: Patient is not psychotic, no suicidal or hemocidal ideation.  Labs on Admission: I have personally reviewed following labs and imaging studies  CBC: Recent Labs  Lab 08/12/22 1224  WBC 13.6*  NEUTROABS 10.6*  HGB 12.5*  HCT 39.7  MCV 98.8  PLT 397   Basic Metabolic Panel: Recent Labs  Lab 08/12/22 1224  NA 140  K 3.8  CL 101  CO2 31  GLUCOSE 100*  BUN 25*  CREATININE 1.21  CALCIUM 8.7*  MG 2.0   GFR: Estimated Creatinine Clearance: 49.2 mL/min (by C-G formula based on SCr of 1.21 mg/dL). Liver Function Tests: Recent Labs  Lab 08/12/22 1224  AST 22  ALT 14  ALKPHOS 50  BILITOT 0.9  PROT 6.4*  ALBUMIN 3.6   No results for input(s): "LIPASE", "AMYLASE" in the last 168 hours. No results for input(s): "AMMONIA" in the last 168  hours. Coagulation Profile: Recent Labs  Lab 08/12/22 1606  INR 1.1   Cardiac Enzymes: No results for input(s): "CKTOTAL", "CKMB", "CKMBINDEX", "TROPONINI" in the last 168 hours. BNP (  last 3 results) Recent Labs    05/30/22 1106  PROBNP 34.0   HbA1C: No results for input(s): "HGBA1C" in the last 72 hours. CBG: No results for input(s): "GLUCAP" in the last 168 hours. Lipid Profile: No results for input(s): "CHOL", "HDL", "LDLCALC", "TRIG", "CHOLHDL", "LDLDIRECT" in the last 72 hours. Thyroid Function Tests: No results for input(s): "TSH", "T4TOTAL", "FREET4", "T3FREE", "THYROIDAB" in the last 72 hours. Anemia Panel: No results for input(s): "VITAMINB12", "FOLATE", "FERRITIN", "TIBC", "IRON", "RETICCTPCT" in the last 72 hours. Urine analysis:    Component Value Date/Time   COLORURINE YELLOW 11/16/2009 1130   APPEARANCEUR CLEAR 11/16/2009 1130   LABSPEC 1.017 11/16/2009 1130   PHURINE 7.5 11/16/2009 1130   GLUCOSEU NEGATIVE 11/16/2009 1130   HGBUR NEGATIVE 11/16/2009 1130   BILIRUBINUR NEGATIVE 11/16/2009 1130   KETONESUR NEGATIVE 11/16/2009 1130   PROTEINUR NEGATIVE 11/16/2009 1130   UROBILINOGEN 0.2 11/16/2009 1130   NITRITE NEGATIVE 11/16/2009 1130   LEUKOCYTESUR  11/16/2009 1130    NEGATIVE MICROSCOPIC NOT DONE ON URINES WITH NEGATIVE PROTEIN, BLOOD, LEUKOCYTES, NITRITE, OR GLUCOSE <1000 mg/dL.   Sepsis Labs: '@LABRCNTIP'$ (procalcitonin:4,lacticidven:4) ) Recent Results (from the past 240 hour(s))  Resp panel by RT-PCR (RSV, Flu A&B, Covid) Anterior Nasal Swab     Status: Abnormal   Collection Time: 08/12/22 12:27 PM   Specimen: Anterior Nasal Swab  Result Value Ref Range Status   SARS Coronavirus 2 by RT PCR NEGATIVE NEGATIVE Final    Comment: (NOTE) SARS-CoV-2 target nucleic acids are NOT DETECTED.  The SARS-CoV-2 RNA is generally detectable in upper respiratory specimens during the acute phase of infection. The lowest concentration of SARS-CoV-2 viral copies  this assay can detect is 138 copies/mL. A negative result does not preclude SARS-Cov-2 infection and should not be used as the sole basis for treatment or other patient management decisions. A negative result may occur with  improper specimen collection/handling, submission of specimen other than nasopharyngeal swab, presence of viral mutation(s) within the areas targeted by this assay, and inadequate number of viral copies(<138 copies/mL). A negative result must be combined with clinical observations, patient history, and epidemiological information. The expected result is Negative.  Fact Sheet for Patients:  EntrepreneurPulse.com.au  Fact Sheet for Healthcare Providers:  IncredibleEmployment.be  This test is no t yet approved or cleared by the [place] FDA and  has been authorized for detection and/or diagnosis of SARS-CoV-2 by FDA under an Emergency Use Authorization (EUA). This EUA will remain  in effect (meaning this test can be used) for the duration of the COVID-19 declaration under Section 564(b)(1) of the Act, 21 U.S.C.section 360bbb-3(b)(1), unless the authorization is terminated  or revoked sooner.       Influenza A by PCR NEGATIVE NEGATIVE Final   Influenza B by PCR NEGATIVE NEGATIVE Final    Comment: (NOTE) The Xpert Xpress SARS-CoV-2/FLU/RSV plus assay is intended as an aid in the diagnosis of influenza from Nasopharyngeal swab specimens and should not be used as a sole basis for treatment. Nasal washings and aspirates are unacceptable for Xpert Xpress SARS-CoV-2/FLU/RSV testing.  Fact Sheet for Patients: EntrepreneurPulse.com.au  Fact Sheet for Healthcare Providers: IncredibleEmployment.be  This test is not yet approved or cleared by the [place] FDA and has been authorized for detection and/or diagnosis of SARS-CoV-2 by FDA under an Emergency Use Authorization (EUA). This EUA  will remain in effect (meaning this test can be used) for the duration of the COVID-19 declaration under Section 564(b)(1) of the Act, 21 U.S.C. section  360bbb-3(b)(1), unless the authorization is terminated or revoked.     Resp Syncytial Virus by PCR POSITIVE (A) NEGATIVE Final    Comment: (NOTE) Fact Sheet for Patients: EntrepreneurPulse.com.au  Fact Sheet for Healthcare Providers: IncredibleEmployment.be  This test is not yet approved or cleared by the [place] FDA and has been authorized for detection and/or diagnosis of SARS-CoV-2 by FDA under an Emergency Use Authorization (EUA). This EUA will remain in effect (meaning this test can be used) for the duration of the COVID-19 declaration under Section 564(b)(1) of the Act, 21 U.S.C. section 360bbb-3(b)(1), unless the authorization is terminated or revoked.  Performed at [facility], [address] [place], [place] [address]   Expectorated Sputum Assessment w Gram Stain, Rflx to Resp Cult     Status: None   Collection Time: 08/12/22  4:39 PM   Specimen: Sputum  Result Value Ref Range Status   Specimen Description SPUTUM  Final   Special Requests NONE  Final   Sputum evaluation   Final    Sputum specimen not acceptable for testing.  Please recollect.   NOTIFIED ASHLEY TREXLER ON 08/12/22 AT 1735 QSD Performed at [facility], [address] [place], [place] [address]    Report Status 08/12/2022 FINAL  Final     Radiological Exams on Admission: DG Chest Port 1 View  Result Date: 08/12/2022 CLINICAL DATA:  Shortness of breath. EXAM: PORTABLE CHEST 1 VIEW COMPARISON:  Two-view chest x-ray 07/18/2022 FINDINGS: Heart size is normal. Changes of COPD again noted. No superimposed airspace disease or edema is present. No significant effusions are present. Overlying defibrillator pads and leads are noted. IMPRESSION: 1. No acute cardiopulmonary disease. 2. COPD.  Electronically Signed   By: San Morelle M.D.   On: 08/12/2022 12:54      Assessment/Plan Principal Problem:   RSV infection Active Problems:   Acute on chronic respiratory failure with hypoxia (HCC)   COPD exacerbation (HCC)   Atrial fibrillation with RVR (HCC)   Coronary artery disease   Myocardial injury   Hypertension   Hyperlipidemia   BPH (benign prostatic hyperplasia)   Anxiety   Assessment and Plan:  Acute on chronic respiratory failure with hypoxia and  RSV infection and RSV induced COPD exacerbation: Patient is requiring BiPAP now.  -will admit to SDU as inpatient -BiPAP -Bronchodilators -Patient was given 2 g of magnesium sulfate in ED -Solu-Medrol 40 mg IV bid -Mucinex for cough  -Incentive spirometry -sputum culture -Nasal cannula oxygen as needed to maintain O2 saturation 93% or greater when pt is off BiPAP  Atrial fibrillation with RVR, new onset: HR is up to 180s.  CHADS2 score is 4. Will need anticoagulants.  Consulted Dr. Audie Box of cardiology.  Patient had 2D echo 08/06/2022 which showed EF of 65 to 70% -Started Cardizem drip -Start metoprolol tart 25 mg twice daily -IV metoprolol 5 mg every 2 hours for heart rate> 125 -If heart rate is not controlled, will start amiodarone drip -IV heparin -check TSH  Coronary artery disease and Myocardial injury: trop 31 -Trend troponin -Continue Crestor, Plavix -Check A1c, FLP -prn NTG  Hypertension -IV hydralazine as needed -Hold Diovan since patient is started on Cardizem drip and metoprolol  Hyperlipidemia -Cresto  BPH (benign prostatic hyperplasia) -Proscar and Flomax  Anxiety -As needed Klonopin     DVT ppx: IV heparin  Code Status:  Partial code (I discussed with the patient, and explained the meaning of CODE STATUS, patient wants to be partial code,  OK for CPR, but no intubation).  Family Communication: I have tried to call his wife who did not pick up the phone.  I left a message to  her.  Disposition Plan:  Anticipate discharge back to previous environment  Consults called:  Dr. Audie Box of card  Admission status and Level of care: Stepdown  as inpt      Dispo: The patient is from: SNF              Anticipated d/c is to: SNF              Anticipated d/c date is: 2 days              Patient currently is not medically stable to d/c.    Severity of Illness:  The appropriate patient status for this patient is INPATIENT. Inpatient status is judged to be reasonable and necessary in order to provide the required intensity of service to ensure the patient's safety. The patient's presenting symptoms, physical exam findings, and initial radiographic and laboratory data in the context of their chronic comorbidities is felt to place them at high risk for further clinical deterioration. Furthermore, it is not anticipated that the patient will be medically stable for discharge from the hospital within 2 midnights of admission.   * I certify that at the point of admission it is my clinical judgment that the patient will require inpatient hospital care spanning beyond 2 midnights from the point of admission due to high intensity of service, high risk for further deterioration and high frequency of surveillance required.*       Date of Service 08/12/2022    Ivor Costa [facility]   If 7PM-7AM, please contact night-coverage [email or web address] 08/12/2022, 6:06 PM

## 2022-08-12 NOTE — ED Triage Notes (Signed)
Pt arrived via EMS from [facility]. They were called out for SOB, pt normally wears 2L [place] at home, pt was 94% on 4L. Per EMS with any movement pt had increased WOB. Hx of COPD and emphysema. Pt had 2 duonebs before EMS arrived. Pt placed on CPAP enroute to [facility] because of increased WOB. Per EMS heart rate rose from 100-180 enroute to ED.

## 2022-08-12 NOTE — ED Notes (Signed)
Spoke face to face with Dr Blaine Hamper re: pt maxed out on cardizem, HR still in 150s. Dr Blaine Hamper will order amiodarone and ordered for now '5mg'$  metoprolol IV.

## 2022-08-12 NOTE — Progress Notes (Signed)
PHARMACIST - PHYSICIAN COMMUNICATION   CONCERNING: Methylprednisolone IV    Current order: Methylprednisolone IV '40mg'$  bid     DESCRIPTION: Per [place] Protocol:   IV methylprednisolone will be converted to either a q12h or q24h frequency with the same total daily dose (TDD).  Ordered Dose: 1 to 125 mg TDD; convert to: TDD q24h.  Ordered Dose: 126 to 250 mg TDD; convert to: TDD div q12h.  Ordered Dose: >250 mg TDD; DAW.  Order has been adjusted to: Methylprednisolone IV '80mg'$  qd   Darrick Penna , PharmD, BCPS Clinical Pharmacist  08/12/2022 5:59 PM

## 2022-08-12 NOTE — ED Notes (Addendum)
Pt reports breathing has gotten worse in the last 10 mins. Thinks it may be due to anxiety at needing to use the restroom. Unable to give any PO meds will inform admit MD.

## 2022-08-12 NOTE — ED Notes (Signed)
Pt requested repositioning and water.  RN repositioned patient and supplied pt with ice water.  Pt denies CP, SOB at this time.

## 2022-08-12 NOTE — ED Notes (Signed)
Primary RN called to ICU to give report. Bed is being taken away due to night staffing shortage. CN informed.

## 2022-08-12 NOTE — ED Notes (Signed)
Notified MD of pt troponin series and trend.  Orders to DC further troponin draws.

## 2022-08-12 NOTE — ED Notes (Signed)
PO meds will be retimed. Dr Blaine Hamper informed. Pt is back on bipap due to resp difficulty.

## 2022-08-12 NOTE — ED Provider Notes (Signed)
[facility] Provider Note    None    (approximate)   History   Shortness of Breath   HPI  [name] [name]. is a 82 y.o. male with a history of COPD on home O2, pulmonary nodular disease, CAD, hypertension, hyperlipidemia, and GERD who presents with shortness of breath, gradual onset over the last few days, worsening course, associated with productive cough but not with fever.  The patient denies any vomiting or diarrhea.  He has no chest pain but states it feels tight.  I reviewed the past medical records.  The patient was recently admitted in September 2021 for a postprocedural pneumothorax.  His most recent outpatient encounter was on 12/11 of last year with [facility] for follow-up after a COPD flare.   Physical Exam   Triage Vital Signs: ED Triage Vitals  Enc Vitals Group     BP      Pulse      Resp      Temp      Temp src      SpO2      Weight      Height      Head Circumference      Peak Flow      Pain Score      Pain Loc      Pain Edu?      Excl. in [place]?     Most recent vital signs: Vitals:   08/12/22 1404 08/12/22 1432  BP:    Pulse:  (!) 165  Resp:  (!) 22  SpO2: 100% 100%     General: Alert and oriented, no acute distress. CV:  Good peripheral perfusion.  Normal heart sounds. Resp:  Respiratory effort with diminished breath sounds bilaterally. Abd:  No distention.  Other:  No peripheral edema.   ED Results / Procedures / Treatments   Labs (all labs ordered are listed, but only abnormal results are displayed) Labs Reviewed  RESP PANEL BY RT-PCR (RSV, FLU A&B, COVID)  RVPGX2 - Abnormal; Notable for the following components:      Result Value   Resp Syncytial Virus by PCR POSITIVE (*)    All other components within normal limits  COMPREHENSIVE METABOLIC PANEL - Abnormal; Notable for the following components:   Glucose, Bld 100 (*)    BUN 25 (*)    Calcium 8.7 (*)    Total Protein 6.4 (*)    All other  components within normal limits  CBC WITH DIFFERENTIAL/PLATELET - Abnormal; Notable for the following components:   WBC 13.6 (*)    RBC 4.02 (*)    Hemoglobin 12.5 (*)    Neutro Abs 10.6 (*)    Monocytes Absolute 1.1 (*)    Abs Immature Granulocytes 0.21 (*)    All other components within normal limits  BLOOD GAS, VENOUS - Abnormal; Notable for the following components:   pCO2, Ven 68 (*)    Bicarbonate 35.0 (*)    Acid-Base Excess 6.6 (*)    All other components within normal limits  TROPONIN I (HIGH SENSITIVITY) - Abnormal; Notable for the following components:   Troponin I (High Sensitivity) 31 (*)    All other components within normal limits  EXPECTORATED SPUTUM ASSESSMENT W GRAM STAIN, RFLX TO RESP C  BRAIN NATRIURETIC PEPTIDE  LACTIC ACID, PLASMA  LACTIC ACID, PLASMA  MAGNESIUM  HEMOGLOBIN A1C  TROPONIN I (HIGH SENSITIVITY)     EKG  ED ECG REPORT I, Arta Silence, the  attending physician, personally viewed and interpreted this ECG.  Date: 08/12/2022 EKG Time: 1219 Rate: 181 Rhythm: Atrial fibrillation with RVR QRS Axis: RBBB Intervals: normal ST/T Wave abnormalities: Nonspecific ST abnormalities Narrative Interpretation: Atrial for relation with RVR but no evidence of acute ischemia    RADIOLOGY  Chest x-ray: I independently viewed and interpreted the images; there is no focal consolidation or edema   PROCEDURES:  Critical Care performed: Yes, see critical care procedure note(s)  .Critical Care  Performed by: Arta Silence, MD Authorized by: Arta Silence, MD   Critical care provider statement:    Critical care time (minutes):  30   Critical care time was exclusive of:  Separately billable procedures and treating other patients   Critical care was necessary to treat or prevent imminent or life-threatening deterioration of the following conditions:  Respiratory failure   Critical care was time spent personally by me on the following  activities:  Development of treatment plan with patient or surrogate, discussions with consultants, evaluation of patient's response to treatment, examination of patient, ordering and review of laboratory studies, ordering and review of radiographic studies, ordering and performing treatments and interventions, pulse oximetry, re-evaluation of patient's condition, review of old charts and obtaining history from patient or surrogate   Care discussed with: admitting provider      MEDICATIONS ORDERED IN ED: Medications  albuterol (PROVENTIL) (2.5 MG/3ML) 0.083% nebulizer solution 2.5 mg (has no administration in time range)  dextromethorphan-guaiFENesin (MUCINEX DM) 30-600 MG per 12 hr tablet 1 tablet (has no administration in time range)  ondansetron (ZOFRAN) injection 4 mg (has no administration in time range)  hydrALAZINE (APRESOLINE) injection 5 mg (has no administration in time range)  acetaminophen (TYLENOL) tablet 650 mg (has no administration in time range)  enoxaparin (LOVENOX) injection 40 mg (has no administration in time range)  nitroGLYCERIN (NITROSTAT) SL tablet 0.4 mg (has no administration in time range)  rosuvastatin (CRESTOR) tablet 20 mg (has no administration in time range)  irbesartan (AVAPRO) tablet 75 mg (has no administration in time range)  pantoprazole (PROTONIX) EC tablet 40 mg (has no administration in time range)  polyethylene glycol (MIRALAX / GLYCOLAX) packet 17 g (has no administration in time range)  finasteride (PROSCAR) tablet 5 mg (has no administration in time range)  tamsulosin (FLOMAX) capsule 0.4 mg (has no administration in time range)  clopidogrel (PLAVIX) tablet 75 mg (has no administration in time range)  melatonin tablet 5 mg (has no administration in time range)  clonazePAM (KLONOPIN) tablet 0.5 mg (has no administration in time range)  gabapentin (NEURONTIN) capsule 100 mg (has no administration in time range)  levalbuterol (XOPENEX) nebulizer  solution 0.63 mg (has no administration in time range)  ipratropium (ATROVENT) nebulizer solution 0.5 mg (has no administration in time range)  diltiazem (CARDIZEM) 125 mg in dextrose 5% 125 mL (1 mg/mL) infusion (5 mg/hr Intravenous New Bag/Given 08/12/22 1451)  magnesium sulfate IVPB 2 g 50 mL (0 g Intravenous Stopped 08/12/22 1342)  methylPREDNISolone sodium succinate (SOLU-MEDROL) 125 mg/2 mL injection 125 mg (125 mg Intravenous Given 08/12/22 1249)  sodium chloride 0.9 % bolus 500 mL (0 mLs Intravenous Stopped 08/12/22 1313)  diltiazem (CARDIZEM) injection 5 mg (5 mg Intravenous Given 08/12/22 1233)  sodium chloride 0.9 % bolus 500 mL (500 mLs Intravenous New Bag/Given 08/12/22 1518)  levalbuterol (XOPENEX) nebulizer solution 1.25 mg (1.25 mg Nebulization Given 08/12/22 1404)     IMPRESSION / MDM / [place] / ED COURSE  I reviewed  the triage vital signs and the nursing notes.  82 year old male with PMH as noted above including COPD presents with worsening shortness of breath and cough over the last several days.  He was hypoxic with EMS and was started on BiPAP.  In the ED the patient is tachycardic to the 180s but with a normal blood pressure.  He demonstrates some tachypnea and increased work of breathing with diminished breath sounds.  Differential diagnosis includes, but is not limited to, COPD exacerbation, influenza, RSV, or other viral etiology, acute bronchitis, pneumonia, less likely acute pulmonary edema or other cardiac etiology.  We will continue treatment with BiPAP, give steroid, magnesium, and bronchodilators, fluid bolus, obtain chest x-ray, lab workup, and reassess.  I ordered a dose of Cardizem due to the rapid atrial fibrillation as well.  Patient's presentation is most consistent with acute presentation with potential threat to life or bodily function.  The patient is on the cardiac monitor to evaluate for evidence of arrhythmia and/or significant heart rate  changes.  ----------------------------------------- 3:00 PM on 08/12/2022 -----------------------------------------  Chest x-ray showed no focal acute findings.  Respiratory panel was positive for RSV which is consistent with the patient's presentation.  Heart rate improved with fluids and Cardizem although is still in the 130s so I have ordered a second dose.  The patient may need a Cardizem infusion if this does not adequately improve the heart rate.  Troponin, BNP, lactate are all reassuring.  I consulted Dr. Blaine Hamper from the hospitalist service; based on discussion he agrees to admit the patient.   FINAL CLINICAL IMPRESSION(S) / ED DIAGNOSES   Final diagnoses:  Acute on chronic respiratory failure with hypoxia ([place])  RSV bronchitis     Rx / DC Orders   ED Discharge Orders     None        Note:  This document was prepared using Dragon voice recognition software and may include unintentional dictation errors.    Arta Silence, MD 08/12/22 1526

## 2022-08-13 ENCOUNTER — Telehealth: Payer: Self-pay | Admitting: Emergency Medicine

## 2022-08-13 ENCOUNTER — Encounter: Payer: Self-pay | Admitting: Internal Medicine

## 2022-08-13 ENCOUNTER — Other Ambulatory Visit: Payer: Self-pay

## 2022-08-13 DIAGNOSIS — I4891 Unspecified atrial fibrillation: Secondary | ICD-10-CM | POA: Diagnosis not present

## 2022-08-13 DIAGNOSIS — B338 Other specified viral diseases: Secondary | ICD-10-CM | POA: Diagnosis not present

## 2022-08-13 DIAGNOSIS — J431 Panlobular emphysema: Secondary | ICD-10-CM

## 2022-08-13 LAB — CBC
HCT: 34.9 % — ABNORMAL LOW (ref 39.0–52.0)
Hemoglobin: 11 g/dL — ABNORMAL LOW (ref 13.0–17.0)
MCH: 31.1 pg (ref 26.0–34.0)
MCHC: 31.5 g/dL (ref 30.0–36.0)
MCV: 98.6 fL (ref 80.0–100.0)
Platelets: 232 10*3/uL (ref 150–400)
RBC: 3.54 MIL/uL — ABNORMAL LOW (ref 4.22–5.81)
RDW: 15 % (ref 11.5–15.5)
WBC: 8.1 10*3/uL (ref 4.0–10.5)
nRBC: 0 % (ref 0.0–0.2)

## 2022-08-13 LAB — BASIC METABOLIC PANEL
Anion gap: 8 (ref 5–15)
BUN: 27 mg/dL — ABNORMAL HIGH (ref 8–23)
CO2: 27 mmol/L (ref 22–32)
Calcium: 8.1 mg/dL — ABNORMAL LOW (ref 8.9–10.3)
Chloride: 102 mmol/L (ref 98–111)
Creatinine, Ser: 1.14 mg/dL (ref 0.61–1.24)
GFR, Estimated: >60 mL/min (ref >60)
Glucose, Bld: 169 mg/dL — ABNORMAL HIGH (ref 70–99)
Potassium: 4.7 mmol/L (ref 3.5–5.1)
Sodium: 137 mmol/L (ref 135–145)

## 2022-08-13 LAB — LIPID PANEL
Cholesterol: 141 mg/dL (ref 0–200)
HDL: 80 mg/dL (ref >40)
LDL Cholesterol: 52 mg/dL (ref 0–99)
Total CHOL/HDL Ratio: 1.8 RATIO
Triglycerides: 46 mg/dL (ref <150)
VLDL: 9 mg/dL (ref 0–40)

## 2022-08-13 LAB — HEPARIN LEVEL (UNFRACTIONATED)
Heparin Unfractionated: 0.58 IU/mL (ref 0.30–0.70)
Heparin Unfractionated: 0.56 IU/mL (ref 0.30–0.70)

## 2022-08-13 LAB — EXPECTORATED SPUTUM ASSESSMENT W GRAM STAIN, RFLX TO RESP C

## 2022-08-13 LAB — HEMOGLOBIN A1C
Hgb A1c MFr Bld: 5 % (ref 4.8–5.6)
Mean Plasma Glucose: 96.8 mg/dL

## 2022-08-13 MED ORDER — FUROSEMIDE 10 MG/ML IJ SOLN
20.0000 mg | Freq: Once | INTRAMUSCULAR | Status: AC
  Administered 2022-08-13: 20 mg via INTRAVENOUS
  Filled 2022-08-13: qty 4

## 2022-08-13 MED ORDER — DILTIAZEM HCL 60 MG PO TABS
30.0000 mg | ORAL_TABLET | Freq: Four times a day (QID) | ORAL | Status: DC
  Administered 2022-08-13 – 2022-08-14 (×3): 30 mg via ORAL
  Filled 2022-08-13 (×3): qty 1

## 2022-08-13 NOTE — Hospital Course (Addendum)
[name] Todt. is a 82 y.o. 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  01/14: Patient was found to have severe respiratory distress and severe tachycardia with heart rates up to 180s.  Found to have new onset atrial fibrillation.  He cannot speak in full sentence, using accessory muscle for breathing, initially started on CPAP but still has respiratory distress.  BiPAP is started in the ED. (+)PCR for RSV, BNP 61, lactic acid normal 1.7, WBC 13.6, GFR> 60, blood pressure 130/59, RR 28.  ABG with pH 7.32, CO2 68, O2 43.  Chest x-ray showed COPD without infiltration.  Patient is admitted to stepdown as inpatient. Later in the afternoon was transitioned to [place] O2 2L/min 01/15: maintaining on [place] O2 2-4L. HR improved. Cardizem gtt d/c overnight d/t bradycardia. Afib --> Aflutter. Remains on heparin gtt. Cardiology [facility]) saw patient - "If remains symptomatic with shortness of breath and palpitations can consider TEE/DCCV"  01/16: remains significantly SOB, titrating rate control meds   Consultants:  Cardiology  Procedures: none      ASSESSMENT & PLAN:   Principal Problem:   RSV infection Active Problems:   Acute on chronic respiratory failure with hypoxia (HCC)   COPD exacerbation ([place])   Atrial fibrillation with RVR ([place])   Coronary artery disease   Myocardial injury   Hypertension   Hyperlipidemia   BPH (benign prostatic hyperplasia)   Anxiety  Acute on chronic respiratory failure with hypoxia (on 2-4L O2 at home)  RSV infection  RSV induced COPD exacerbation:  BiPAP --> [place]. Bronchodilators was given 2 g of magnesium sulfate in ED Solu-Medrol 40 mg IV bid Mucinex for cough  Incentive spirometry sputum culture Supplemental O2 - he is usually on 2-4L/min at home  Reports he is about at baseline, still significant SOB,  consider ambulatory walk test tomorrow  Per cardiology, Lasix 20 mg IV x1 today    Atrial fibrillation with RVR, new onset:  HR is up to 180s.   CHADS2 score is 4. Will need anticoagulants.    Patient had 2D echo 08/06/2022 which showed EF of 65 to 70% Cardizem drip d/c Rate control w/ po Cardizem -- increased today to 45 mg q6h IV metoprolol 5 mg every 2 hours for heart rate> 125 IV heparin --> Eliquis check TSH Cardiology [facility]) following   Anticipate outpatient cardioversion once acute respiratory failure/RSV infection have resolved and the patient has completed 4 weeks of uninterrupted anticoagulation    Mild volume overload s/p Lasix IV x1 per cardiology   Coronary artery disease and Myocardial injury, ACS ruled out:  Trend troponin --> flat  Continue Crestor D/c Plavix now that on Eliquis Check A1c, FLP prn NTG D/c ASA    Hypertension IV hydralazine as needed Hold Diovan since patient was started on diltiazem drip and metoprolol, now is on po diltiazem for rate control    Hyperlipidemia Crestor   BPH (benign prostatic hyperplasia) Proscar and Flomax   Anxiety As needed Klonopin   DVT prophylaxis: heparin gtt off today, transition to Eliquis Pertinent IV fluids/nutrition: no continuous IV fludis  Central lines / invasive devices: none  Code Status: FULL CODE   Current Admission Status: inpatient  TOC needs / Dispo plan: none at this time, anticipate back home Barriers to discharge / significant pending items: cardiac stabilization and cardiology clearance, wean to  home O2

## 2022-08-13 NOTE — ED Notes (Signed)
Pt feeling SOB and wheezy. PRN albuterol administered

## 2022-08-13 NOTE — ED Notes (Signed)
Microbiology called from [facility] and states patient's respiratory culture results have changed. She states she is about to upload results into computer now

## 2022-08-13 NOTE — Consult Note (Signed)
Cardiology Consultation   Patient ID: [name] [name]. MRN: [id number]; DOB: 02/02/1941  Admit date: 08/12/2022 Date of Consult: 08/13/2022  PCP:  [name] Carbon, MD   [facility] Cardiologist:  Jenean Lindau, MD        Patient Profile:   [name] [name]. is a 82 y.o. 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 Blaine Hamper.  History of Present Illness:   Mr. 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  Left heart catheterization was completed on 04/15/2018 where he successful DES/PCI of the ostial first marginal with successful DES/PCI.  He was recommended at that time that he have uninterrupted dual antiplatelet therapy with aspirin 81 mg and clopidogrel 75 mg daily for minimum of 6 months with the continuation of aggressive secondary prevention.  Echocardiogram completed 08/06/2022 which showed LVEF of 65 to 70%, no regional wall motion abnormalities, mild left ventricular hypertrophy, without valvular abnormalities.  He was last seen in clinic 10/05/2021 at that time secondary prevention of coronary artery disease was stressed with the importance of compliance with diet and medication.  Blood pressures were stable.  He was continued on statin therapy.  COPD on oxygen was stable.  There were no further labs or testing that were ordered at that time.  He presented to the [facility] emergency department on 08/12/2022 with complaints of worsening shortness of breath.  He stated been  gradual onset over the past few days with a worsening course associated with productive cough but not with fever.  He denied any chest pain but endorsed chest tightness.  Denied any nausea, vomiting or any other associated symptoms.  Was found to be in new onset atrial fibrillation RVR on arrival to the emergency department.  And he was started on CPAP was still in respiratory distress and was transitioned to BiPAP, with some improvement and later on was able to be weaned to nasal cannula.  He was started on a diltiazem drip and beta-blocker therapy with metoprolol to tartrate for rate control.  Unfortunately the drip had to be stopped due to low heart rates and he had changed from atrial fibrillation to atrial flutter.  With rate control his symptoms of chest tightness and palpitations had resolved.  He continues with shortness of breath and cough.  Initial vitals: Blood pressure 129/113 with a recheck of 130/70, pulse 165, respirations 22, oxygen saturation of 100%  Pertinent labs: Respiratory panel positive for RSV, blood glucose 500, BUN 25, calcium 8.7, WBCs 13.6, hemoglobin 12.5, high-sensitivity troponin of 31  Imaging: Chest x-ray revealed no acute cardiopulmonary disease and revealed COPD.  Medications administered in the emergency department: DuoNeb, Mucinex, Cardizem infusion 5 mg/h, magnesium sulfate 2 g IVP, methylprednisone 125 mg IVP, normal saline 500 mL bolus x 2    Past Medical History:  Diagnosis Date   Abdominal discomfort 12/07/2020   Acquired trigger finger of right middle finger 02/04/2020   Acute prostatitis 04/08/2018   Alcohol abuse    Alcohol dependence ([place]) 12/07/2020   Anal or rectal pain 04/08/2018   Annual physical exam 12/07/2020  Anorectal disorder 12/07/2020   Arthritis    Atherosclerotic heart disease of native coronary artery without angina pectoris 04/08/2018   Benign prostatic hyperplasia with lower urinary tract symptoms 12/07/2020   BPH with elevated  PSA    Cancer ([place])    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   Chronic obstructive pulmonary disease with (acute) exacerbation (HCC) 12/07/2020   Chronic respiratory failure with hypoxia (HCC) 08/10/2019   Chronic sinusitis 12/07/2020   Cigarette nicotine dependence, uncomplicated 24/82/5003   Coagulation disorder ([place]) 01/13/2019   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   Contracture of palmar fascia 12/07/2020   COPD (chronic obstructive pulmonary disease) ([place])    Corn of toe 12/07/2020   Corns and callosities 04/09/2018   Coronary artery disease    2019 with stents   Coronary atherosclerosis 05/07/2018   Degenerative disc disease, 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   Degenerative disc disease, 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   Diarrhea 04/09/2018   Double vision with both eyes open 12/07/2020   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   Dysphagia 08/14/2018   Dyspnea    ED (erectile dysfunction)    ED (erectile dysfunction) of organic origin 12/07/2020   Elevated prostate specific antigen (PSA) 04/08/2018   Elevated PSA    Encounter for screening for other disorder 12/07/2020   Enlarged prostate without lower urinary tract symptoms (luts) 04/09/2018   Enthesopathy 12/07/2020   Essential (primary) hypertension 04/08/2018   Ex-smoker 04/09/2018   Exertional dyspnea 04/02/2018   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   Flatulence 04/08/2018   Gastro-esophageal reflux disease without esophagitis 04/08/2018   GERD (gastroesophageal reflux disease)    History of acute otitis externa 08/14/2018   Hyperlipidemia    Hypertension    Hypoxemia 12/07/2020   Kidney cysts    Left knee pain 12/07/2020   Low back pain 04/08/2018   Male erectile dysfunction 04/09/2018   Mixed hyperlipidemia 04/08/2018   Nocturia more than twice per night 04/09/2018   Osteoarthritis of first carpometacarpal joint 04/08/2018   Osteoarthritis of knee 12/07/2020   Other long term  (current) drug therapy 12/07/2020   Pain due to onychomycosis of toenail of left foot 07/20/2019   Pain in joint 04/09/2018   Pain in knee 04/09/2018   Pain in unspecified knee 12/07/2020   Pain of finger 04/09/2018   Palpitations    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[place]) 12/07/2020   Personal history of colonic polyps 12/07/2020   Pneumonia    Pneumothorax 04/12/2020   Polypharmacy 04/09/2018   Porokeratosis 01/13/2019   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[place])    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   Pulmonary nodules 06/08/2016   Pure hypercholesterolemia 12/07/2020   Renal cyst 12/07/2020   Sleep disorder 04/08/2018   Status post bronchoscopy 04/12/2020   Tear of rotator cuff 04/09/2018   Tobacco user 70/48/8891   Uncomplicated alcohol dependence ([place]) 04/09/2018   Unspecified rotator cuff tear or rupture of unspecified shoulder, not specified as traumatic 12/07/2020   Unspecified tear of unspecified meniscus, current injury, unspecified knee, initial encounter 12/07/2020   Visual disturbance 12/07/2020   Vitamin D deficiency 04/08/2018    Past Surgical History:  Procedure Laterality Date   BRONCHIAL BIOPSY  10/13/2019   Procedure: BRONCHIAL BIOPSIES;  Surgeon: Collene Gobble, MD;  Location: [place];  Service: Pulmonary;;   BRONCHIAL BIOPSY  04/12/2020   Procedure: BRONCHIAL BIOPSIES;  Surgeon: Collene Gobble, MD;  Location: [place];  Service: Pulmonary;;   BRONCHIAL BRUSHINGS  10/13/2019   Procedure:  BRONCHIAL BRUSHINGS;  Surgeon: Collene Gobble, MD;  Location: [facility] ENDOSCOPY;  Service: Pulmonary;;   BRONCHIAL BRUSHINGS  04/12/2020   Procedure: BRONCHIAL BRUSHINGS;  Surgeon: Collene Gobble, MD;  Location: [facility] ENDOSCOPY;  Service: Pulmonary;;   BRONCHIAL NEEDLE ASPIRATION BIOPSY  10/13/2019   Procedure: BRONCHIAL NEEDLE ASPIRATION BIOPSIES;  Surgeon: Collene Gobble, MD;  Location: [facility] ENDOSCOPY;  Service: Pulmonary;;   BRONCHIAL NEEDLE ASPIRATION BIOPSY  04/12/2020   Procedure: BRONCHIAL  NEEDLE ASPIRATION BIOPSIES;  Surgeon: Collene Gobble, MD;  Location: [facility] ENDOSCOPY;  Service: Pulmonary;;   BRONCHIAL WASHINGS  10/13/2019   Procedure: BRONCHIAL WASHINGS;  Surgeon: Collene Gobble, MD;  Location: [place];  Service: Pulmonary;;   BRONCHIAL WASHINGS  04/12/2020   Procedure: BRONCHIAL WASHINGS;  Surgeon: Collene Gobble, MD;  Location: [facility] ENDOSCOPY;  Service: Pulmonary;;   CARDIAC CATHETERIZATION  2002   50% RCA   CATARACT EXTRACTION, BILATERAL     CORONARY STENT INTERVENTION N/A 04/15/2018   Procedure: CORONARY STENT INTERVENTION;  Surgeon: Jettie Booze, MD;  Location: [place] CV LAB;  Service: Cardiovascular;  Laterality: N/A;  om1   EYE SURGERY     KNEE ARTHROSCOPY     LEFT HEART CATH AND CORONARY ANGIOGRAPHY N/A 04/15/2018   Procedure: LEFT HEART CATH AND CORONARY ANGIOGRAPHY;  Surgeon: Jettie Booze, MD;  Location: [place] CV LAB;  Service: Cardiovascular;  Laterality: N/A;   MULTIPLE TOOTH EXTRACTIONS     TONSILLECTOMY     VIDEO BRONCHOSCOPY  04/12/2020   VIDEO BRONCHOSCOPY WITH ENDOBRONCHIAL NAVIGATION N/A 07/26/2016   Procedure: VIDEO BRONCHOSCOPY WITH ENDOBRONCHIAL NAVIGATION;  Surgeon: Collene Gobble, MD;  Location: [place];  Service: Thoracic;  Laterality: N/A;   VIDEO BRONCHOSCOPY WITH ENDOBRONCHIAL NAVIGATION N/A 10/13/2019   Procedure: [facility] AND VIDEO BRONCHOSCOPY WITH ENDOBRONCHIAL NAVIGATION;  Surgeon: Collene Gobble, MD;  Location: [place] ENDOSCOPY;  Service: Pulmonary;  Laterality: N/A;   VIDEO BRONCHOSCOPY WITH ENDOBRONCHIAL NAVIGATION N/A 04/12/2020   Procedure: VIDEO BRONCHOSCOPY WITH ENDOBRONCHIAL NAVIGATION;  Surgeon: Collene Gobble, MD;  Location: [place] ENDOSCOPY;  Service: Pulmonary;  Laterality: N/A;     Home Medications:  Prior to Admission medications   Medication Sig Start Date End Date Taking? Authorizing Provider  albuterol (PROVENTIL) (2.5 MG/3ML) 0.083% nebulizer solution Take 3 mLs (2.5 mg total) by nebulization every 6  (six) hours as needed for wheezing or shortness of breath. 06/18/22  Yes Parrett, Tammy S, NP  albuterol (VENTOLIN HFA) 108 (90 Base) MCG/ACT inhaler Inhale 2 puffs into the lungs 3 (three) times daily as needed for wheezing or shortness of breath. 11/10/20  Yes [provider]  ammonium lactate (AMLACTIN) 12 % cream Apply topically at bedtime. 11/06/21  Yes [provider]  Budeson-Glycopyrrol-Formoterol (BREZTRI AEROSPHERE) 160-9-4.8 MCG/ACT AERO Inhale 2 puffs into the lungs 2 (two) times daily. 08/06/22  Yes Collene Gobble, MD  clopidogrel (PLAVIX) 75 MG tablet Take 1 tablet (75 mg total) by mouth daily. 08/06/22  Yes Revankar, Reita Cliche, MD  finasteride (PROSCAR) 5 MG tablet Take 5 mg by mouth daily. 01/13/22  Yes [provider]  furosemide (LASIX) 20 MG tablet Take 20 mg by mouth daily. 04/18/22  Yes [provider]  gabapentin (NEURONTIN) 100 MG capsule Take 100 mg by mouth 2 (two) times daily. 07/26/21  Yes [provider]  ketoconazole (NIZORAL) 2 % cream Apply topically 2 (two) times daily. 02/22/22  Yes [provider]  Melatonin 5 MG CAPS Take 5 mg by  mouth at bedtime.   Yes [provider]  pantoprazole (PROTONIX) 40 MG tablet Take 40 mg by mouth daily. 01/10/21  Yes [provider]  predniSONE (DELTASONE) 10 MG tablet Take 4 tablets (40 mg total) by mouth daily with breakfast for 3 days, THEN 3 tablets (30 mg total) daily with breakfast for 3 days, THEN 2 tablets (20 mg total) daily with breakfast for 3 days. 08/09/22 08/18/22 Yes Collene Gobble, MD  rosuvastatin (CRESTOR) 20 MG tablet Take 1 tablet (20 mg total) by mouth at bedtime. 11/27/21  Yes Revankar, Reita Cliche, MD  tamsulosin (FLOMAX) 0.4 MG CAPS capsule TAKE 1 CAPSULE BY MOUTH EVERY DAY 05/17/22  Yes Stoioff, Ronda Fairly, MD  valsartan (DIOVAN) 80 MG tablet Take 80 mg by mouth daily. 07/12/21  Yes [provider]  clonazePAM (KLONOPIN) 0.5 MG tablet Take 0.5 mg by  mouth daily as needed for anxiety. 01/10/21   [provider]  levofloxacin (LEVAQUIN) 500 MG tablet Take 1 tablet (500 mg total) by mouth daily. Patient not taking: Reported on 08/12/2022 08/09/22   Collene Gobble, MD  nitroGLYCERIN (NITROSTAT) 0.4 MG SL tablet Place 1 tablet (0.4 mg total) under the tongue every 5 (five) minutes as needed for chest pain. 07/13/21   Revankar, Reita Cliche, MD  OXYGEN Inhale into the lungs. 2-3 liters upon exertion and 2 liters continuous at night.    [provider]  polyethylene glycol powder (GLYCOLAX/MIRALAX) 17 GM/SCOOP powder Take 255 g by mouth daily. Patient taking differently: Take 1 Container by mouth daily as needed. 03/20/22   Daryel November, MD  predniSONE (DELTASONE) 10 MG tablet Take 10 mg by mouth daily with breakfast. Patient not taking: Reported on 08/12/2022    [provider]    Inpatient Medications: Scheduled Meds:  clopidogrel  75 mg Oral Daily   finasteride  5 mg Oral Daily   gabapentin  100 mg Oral BID   ipratropium  0.5 mg Nebulization Q6H   levalbuterol  0.63 mg Nebulization Q6H   melatonin  5 mg Oral QHS   methylPREDNISolone (SOLU-MEDROL) injection  80 mg Intravenous Q24H   metoprolol tartrate  25 mg Oral BID   pantoprazole  40 mg Oral Daily   rosuvastatin  20 mg Oral Daily   tamsulosin  0.4 mg Oral Daily   Continuous Infusions:  diltiazem (CARDIZEM) infusion Stopped (08/13/22 0505)   heparin 1,000 Units/hr (08/13/22 0124)   PRN Meds: acetaminophen, albuterol, clonazePAM, dextromethorphan-guaiFENesin, hydrALAZINE, metoprolol tartrate, nitroGLYCERIN, ondansetron (ZOFRAN) IV, polyethylene glycol  Allergies:    Allergies  Allergen Reactions   Flagyl [Metronidazole] Other (See Comments)    Other reaction(s): skin reaction    Social History:   Social History   Socioeconomic History   Marital status: Married    Spouse name: Not on file   Number of children: 2   Years of education: Not on file    Highest education level: Not on file  Occupational History   Occupation: Personal assistant, insurance, 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  Tobacco Use   Smoking status: Former    Packs/day: 0.75    Years: 67.00    Total pack years: 50.25    Types: Cigarettes    Start date: 1954    Quit date: 07/13/2020    Years since quitting: 2.0    Passive exposure: Never   Smokeless tobacco: Never   Tobacco comments:    Recent Quit    Vaping Use   Vaping Use:  Former  Substance and Sexual Activity   Alcohol use: No    Alcohol/week: 0.0 standard drinks of alcohol    Comment: quit drinking 2014   Drug use: No   Sexual activity: Not on file  Other Topics Concern   Not on file  Social History Narrative   Has living will   Wife is health care POA--alternate is son Buelah Manis)   Has daughter in [place]   Has DNR   No tube feeds if cognitively unaware   Social Determinants of Health   Financial Resource Strain: Not on file  Food Insecurity: Not on file  Transportation Needs: Not on file  Physical Activity: Not on file  Stress: Not on file  Social Connections: Not on file  Intimate Partner Violence: Not on file    Family History:    Family History  Problem Relation Age of Onset   Hypertension Mother    Alzheimer's disease Mother      ROS:  Please see the history of present illness.  Review of Systems  Constitutional:  Positive for malaise/fatigue.  Respiratory:  Positive for cough, shortness of breath and wheezing.   Cardiovascular:  Positive for palpitations.       Noted chest tightness  Neurological:  Positive for weakness.    All other ROS reviewed and negative.     Physical Exam/Data:   Vitals:   08/13/22 0445 08/13/22 0500 08/13/22 0530 08/13/22 0600  BP:  (!) 111/58 (!) 127/56 134/68  Pulse: (!) 58 (!) 58 (!) 58 61  Resp: '19 12 16 '$ (!) 22  Temp:      TempSrc:      SpO2: 100% 100% 100% 100%  Weight:        Intake/Output Summary (Last 24 hours) at 08/13/2022  0816 Last data filed at 08/12/2022 1313 Gross per 24 hour  Intake 500 ml  Output --  Net 500 ml      08/12/2022    2:32 PM 07/09/2022   10:40 AM 07/05/2022    9:30 AM  Last 3 Weights  Weight (lbs) 160 lb 0.9 oz 160 lb 160 lb  Weight (kg) 72.6 kg 72.576 kg 72.576 kg     Body mass index is 22.64 kg/m.  General:  Well nourished, well developed, in no acute distress HEENT: normal, glasses on Neck: no JVD Vascular: No carotid bruits; Distal pulses 2+ bilaterally Cardiac:  normal S1, S2; RRR; II/VI systolic murmur without rubs or gallops  Lungs:  clear upper lobes with diminished bases to auscultation bilaterally, expiratory wheezing noted throughout, without rhonchi or rales, currently receiving breathing treatment, oxygen saturations have been maintained on 2 L of O2 via nasal cannula Abd: soft, nontender, no hepatomegaly  Ext: no edema, venous discoloration noted to the bilateral lower extremities with hyperpigmentation, he does have a reddened area to the second toe on the left foot that had blistered as well as redness and noted to his pinky toe, no open areas were noted and no drainage, he does have scabbing Musculoskeletal:  No deformities, BUE and BLE strength normal and equal Skin: warm and dry  Neuro:  CNs 2-12 intact, no focal abnormalities noted Psych:  Normal affect   EKG:  The EKG was personally reviewed and demonstrates: On arrival he was atrial fibrillation RVR with a rate of 181, this morning he was A-fib to a flutter with a rate of 59 Telemetry:  Telemetry was personally reviewed and demonstrates: Atrial flutter rates in the 80s  Relevant CV  Studies: TTE 08/06/22 1. Tachycardia noted. Left ventricular ejection fraction, by estimation,  is 65 to 70%. The left ventricle has normal function. The left ventricle  has no regional wall motion abnormalities. There is mild left ventricular  hypertrophy. Left ventricular  diastolic parameters are indeterminate. There is the  interventricular  septum is flattened in systole and diastole, consistent with right  ventricular pressure and volume overload.   2. Right ventricular systolic function is normal. The right ventricular  size is normal. Tricuspid regurgitation signal is inadequate for assessing  PA pressure.   3. The mitral valve is normal in structure. No evidence of mitral valve  regurgitation. No evidence of mitral stenosis.   4. The aortic valve is normal in structure. Aortic valve regurgitation is  not visualized. No aortic stenosis is present.   5. The inferior vena cava is normal in size with greater than 50%  respiratory variability, suggesting right atrial pressure of 3 mmHg.    LHC 04/15/2018 Prox RCA lesion is 25% stenosed. 1st RPLB lesion is 50% stenosed. THis is a very small vessel. Mid LAD lesion is 25% stenosed. 1st Mrg lesion is 90% stenosed. A drug-eluting stent was successfully placed using a STENT SYNERGY DES 2.5X24. Post intervention, there is a 0% residual stenosis. Ost 1st Mrg lesion is 70% stenosed. A drug-eluting stent was successfully placed using a STENT SYNERGY DES 2.75X8. Post intervention, there is a 0% residual stenosis. The left ventricular systolic function is normal. LV end diastolic pressure is normal. The left ventricular ejection fraction is 55-65% by visual estimate. There is no aortic valve stenosis. Recommend uninterrupted dual antiplatelet therapy with Aspirin '81mg'$  daily and Clopidogrel '75mg'$  daily for a minimum of 6 months (stable ischemic heart disease - Class I recommendation).    Continue aggressive secondary prevention.   Laboratory Data:  High Sensitivity Troponin:   Recent Labs  Lab 08/12/22 1224 08/12/22 1438 08/12/22 1852  TROPONINIHS 31* 29* 25*     Chemistry Recent Labs  Lab 08/12/22 1224 08/13/22 0303  NA 140 137  K 3.8 4.7  CL 101 102  CO2 31 27  GLUCOSE 100* 169*  BUN 25* 27*  CREATININE 1.21 1.14  CALCIUM 8.7* 8.1*  MG 2.0  --    GFRNONAA >60 >60  ANIONGAP 8 8    Recent Labs  Lab 08/12/22 1224  PROT 6.4*  ALBUMIN 3.6  AST 22  ALT 14  ALKPHOS 50  BILITOT 0.9   Lipids  Recent Labs  Lab 08/13/22 0303  CHOL 141  TRIG 46  HDL 80  LDLCALC 52  CHOLHDL 1.8    Hematology Recent Labs  Lab 08/12/22 1224 08/13/22 0303  WBC 13.6* 8.1  RBC 4.02* 3.54*  HGB 12.5* 11.0*  HCT 39.7 34.9*  MCV 98.8 98.6  MCH 31.1 31.1  MCHC 31.5 31.5  RDW 14.9 15.0  PLT 317 232   Thyroid  Recent Labs  Lab 08/12/22 1438  TSH 0.700    BNP Recent Labs  Lab 08/12/22 1227  BNP 61.0    DDimer No results for input(s): "DDIMER" in the last 168 hours.   Radiology/Studies:  DG Chest Port 1 View  Result Date: 08/12/2022 CLINICAL DATA:  Shortness of breath. EXAM: PORTABLE CHEST 1 VIEW COMPARISON:  Two-view chest x-ray 07/18/2022 FINDINGS: Heart size is normal. Changes of COPD again noted. No superimposed airspace disease or edema is present. No significant effusions are present. Overlying defibrillator pads and leads are noted. IMPRESSION: 1. No acute cardiopulmonary disease.  2. COPD. Electronically Signed   By: San Morelle M.D.   On: 08/12/2022 12:54     Assessment and Plan:   New onset atrial fibrillation RVR/atrial flutter -He arrived to the emergency department noted to be in atrial fibrillation with rates of 180 -Likely driven secondary to acute on chronic respiratory failure with hypoxia, COPD exacerbation, and RSV -He was started on Cardizem drip -During the night he became bradycardic and Cardizem drip was discontinued -Last echocardiogram completed 07/2022 which revealed LVEF of 65-70%, no regional wall motion abnormalities, mild left ventricular hypertrophy, -He converted from atrial fibrillation to atrial flutter -Has been continued on metoprolol tartrate 25 mg twice daily -Currently remains on a heparin drip per pharmacy protocol -TSH 0.70 -If remains symptomatic with shortness of breath and  palpitations can consider TEE/DCCV during this hospitalization.  With current diagnoses of RSV and COPD exacerbation will likely need some recovery time prior to procedure being scheduled, procedure was also discussed with patient -Continue cardiac monitoring -Will need to be transition to to [place] prior to discharge for CHA2DS2-VASc score of at least 4, this has been explained to the patient  Acute on chronic respiratory failure with hypoxia and RSV infection accompanied with longstanding history of COPD likely COPD exacerbation induced by RSV -Respiratory panel positive for RSV -Continue with shortness of breath and wheezing requiring breathing treatments -Current COPD exacerbation receiving methylprednisone IVP -Continued on 2 L of O2 via nasal cannula currently maintaining oxygen saturations -Continue with supportive care -Management per IM  Coronary artery disease -With the chest tightness noted yesterday while he was in RVR -Chest tightness has subsided since a rate has been controlled -High-sensitivity troponins trended at 31, 29, and 25 -No other ischemic changes were noted on EKG -Recent echocardiogram completed 07/2022 -Continued on clopidogrel 75 mg daily and rosuvastatin 20 mg daily -Continue cardiac monitoring -EKG as needed for pain or changes -Continued with Nitrostat 0.4 mg sublingual as needed  Hypertension -Blood pressure 140/67 -Continued on metoprolol 25 mg twice daily -PTA valsartan currently on hold to allow for up titration of diltiazem drip, with drip currently on hold if continued elevated blood pressures will need to restart home medications -Vital signs per unit protocol  Hyperlipidemia -LDL 52 at goal on 08/13/2022 -Continue rosuvastatin 20 mg daily  Risk Assessment/Risk Scores:          CHA2DS2-VASc Score = 4   This indicates a 4.8% annual risk of stroke. The patient's score is based upon: CHF History: 0 HTN History: 1 Diabetes History: 0 Stroke  History: 0 Vascular Disease History: 1 Age Score: 2 Gender Score: 0         For questions or updates, please contact [name] Please consult [email or web address] for contact info under    Signed, Kahla Risdon, NP  08/13/2022 8:16 AM

## 2022-08-13 NOTE — Telephone Encounter (Signed)
Called and spoke with patient. He wanted to let RB know that he is currently being admitted to [facility] for RSV and afib. He was scheduled for an appt with RB on 01/17 but cancelled.   While on the phone, he wanted to know the results of his echo test. He was upset that the test was done last week and he hadn't heard anything in regards to the results. I advised him that I would send a message over to TP to ask her for the results.   TP, can you please advise on his echo results? Thanks!

## 2022-08-13 NOTE — ED Notes (Signed)
New male purewick placed on patient. Patient in t shirt that has stains on it, offered to place patient in a gown but refused. States he will change in the am

## 2022-08-13 NOTE — Telephone Encounter (Signed)
Melvenia Needles, NP 08/13/2022  1:58 PM EST     2D echo shows a preserved EF at 65 to 70%, mild LVH, right ventricular systolic function is normal.  RV size is normal.  No valvular disease.  Some mild flattening of the septum in systole and diastole consistent with some volume overload.   Hospital records show that he is hospitalized with A-fib and RSV.  Recommend that he follow-up with cardiology.   Called pt's main number we have listed which was pt's home number. Spouse answered the phone and stated that pt was at the hospital. Tried to call pt on mobile phone but it went directly to VM. Left message for him to return call so we could go over results with him.

## 2022-08-13 NOTE — Consult Note (Signed)
ANTICOAGULATION CONSULT NOTE  Pharmacy Consult for heparin infusion Indication: atrial fibrillation   Patient Measurements: Weight: 72.6 kg (160 lb 0.9 oz) (recorded 06/2022) Heparin Dosing Weight: 72.6 kg  Vital Signs: BP: 109/66 (01/15 1100) Pulse Rate: 90 (01/15 1100)   Estimated Creatinine Clearance: 52.2 mL/min (by C-G formula based on SCr of 1.14 mg/dL).    Medications:  No prior anticoagulation noted  Assessment: 82 y.o. male with medical history significant of HTN, CAD s/p stent presents with acute respiratory distress. Found to have new onset Afib with RVR.  Baseline labs: aPTT 29, INR 1.1, Hgb 12.5, Hct 39.7, Plts 317  Goal of Therapy:  Heparin level 0.3-0.7 units/ml Monitor platelets by anticoagulation protocol: Yes   01/15 0129 HL 0.58, therapeutic x 1 01/15 1047 HL 0.56, therapeutic x 2  Plan:  Continue heparin infusion at 1000 units/hr Recheck HL with daily with morning labs while levels are therapeutic Continue to monitor H&H and platelets  Aubery Lapping, PharmD 08/13/2022 11:56 AM

## 2022-08-13 NOTE — ED Notes (Signed)
RN palpated radial pulse at 64.  Repeated EKG, QRS complexes total 60 bpm.  Diltiazem paused and MD notified. MD verified EKG and gave order to pause Diltiazem.

## 2022-08-13 NOTE — Telephone Encounter (Signed)
Sorry for the delay . See echo result note

## 2022-08-13 NOTE — Progress Notes (Signed)
PROGRESS NOTE    [name] [name].   HDQ:[id number] DOB: 1941-03-17  DOA: 08/12/2022 Date of Service: 08/13/22 PCP: [name], MD     Brief Narrative / Hospital Course:  [name] [name]. is a 82 y.o. 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  01/14: Patient was found to have severe respiratory distress and severe tachycardia with heart rates up to 180s.  Found to have new onset atrial fibrillation.  He cannot speak in full sentence, using accessory muscle for breathing, initially started on CPAP but still has respiratory distress.  BiPAP is started in the ED. (+)PCR for RSV, BNP 61, lactic acid normal 1.7, WBC 13.6, GFR> 60, blood pressure 130/59, RR 28.  ABG with pH 7.32, CO2 68, O2 43.  Chest x-ray showed COPD without infiltration.  Patient is admitted to stepdown as inpatient. Later in the afternoon was transitioned to [place] O2 2L/min 01/15: maintaining on [place] O2 2-4L. HR improved. Cardizem gtt d/c overnight d/t bradycardia. Afib --> Aflutter. Remains on heparin gtt. Cardiology [facility]) saw patient - "If remains symptomatic with shortness of breath and palpitations can consider TEE/DCCV"   Consultants:  Cardiology  Procedures: none      ASSESSMENT & PLAN:   Principal Problem:   RSV infection Active Problems:   Acute on chronic respiratory failure with hypoxia (HCC)   COPD exacerbation ([place])   Atrial fibrillation with RVR ([place])   Coronary artery disease   Myocardial injury   Hypertension   Hyperlipidemia   BPH (benign prostatic hyperplasia)   Anxiety  Acute on chronic respiratory failure with hypoxia (on 2L O2 at home)  RSV infection  RSV induced COPD exacerbation:  BiPAP --> [place]. Bronchodilators already given 2 g of magnesium sulfate in ED Solu-Medrol 40 mg IV bid Mucinex for cough  Incentive  spirometry sputum culture Supplemental O2   Atrial fibrillation with RVR, new onset:  HR is up to 180s.   CHADS2 score is 4. Will need anticoagulants.    Patient had 2D echo 08/06/2022 which showed EF of 65 to 70% Cardizem drip d/c metoprolol tart 25 mg twice daily IV metoprolol 5 mg every 2 hours for heart rate> 125 IV heparin --> plan DOAC prior to d/c  check TSH Cardiology [facility]) following     Coronary artery disease and Myocardial injury:  trop 31 Trend troponin --> flat  Continue Crestor, Plavix Check A1c, FLP prn NTG   Hypertension IV hydralazine as needed Hold Diovan since patient is started on Cardizem drip and metoprolol   Hyperlipidemia Crestor   BPH (benign prostatic hyperplasia) Proscar and Flomax   Anxiety As needed Klonopin   DVT prophylaxis: heparin gtt, will need po AC Pertinent IV fluids/nutrition: no continuous IV fludis  Central lines / invasive devices: none  Code Status: FULL CODE   Current Admission Status: inpatient  TOC needs / Dispo plan: none at this time, anticipate back home Barriers to discharge / significant pending items: cardiac clearance, curently on heparin gtt, anticipate d/c tomorrow/day after if stabilizes as expected              Subjective / Brief ROS:  Patient reports doing better today, still coughing and some SOB w/ activity, no palpitations  Denies CP/SOB at rest  Pain controlled.  Denies new weakness.  Tolerating diet.  Reports no concerns  w/ urination/defecation.   Family Communication: wife at bedside on rouncs     Objective Findings:  Vitals:   08/13/22 0930 08/13/22 1000 08/13/22 1030 08/13/22 1100  BP: 109/70 120/65 113/63 109/66  Pulse: 89 88 90 90  Resp:  (!) '21 16 13  '$ Temp:      TempSrc:      SpO2:  99% 99% 100%  Weight:       No intake or output data in the 24 hours ending 08/13/22 1355 Filed Weights   08/12/22 1432  Weight: 72.6 kg    Examination:  Physical Exam Constitutional:       General: He is not in acute distress.    Appearance: He is well-developed. He is not ill-appearing.  Cardiovascular:     Rate and Rhythm: Normal rate. Rhythm irregular.  Pulmonary:     Effort: Pulmonary effort is normal. No respiratory distress.     Breath sounds: Examination of the right-upper field reveals wheezing. Examination of the left-upper field reveals wheezing. Examination of the right-lower field reveals decreased breath sounds. Examination of the left-lower field reveals decreased breath sounds. Decreased breath sounds and wheezing present.  Musculoskeletal:     Right lower leg: No edema.     Left lower leg: No edema.  Skin:    General: Skin is warm and dry.  Neurological:     General: No focal deficit present.     Mental Status: He is alert and oriented to person, place, and time.  Psychiatric:        Mood and Affect: Mood normal.        Behavior: Behavior normal.          Scheduled Medications:   clopidogrel  75 mg Oral Daily   finasteride  5 mg Oral Daily   gabapentin  100 mg Oral BID   ipratropium  0.5 mg Nebulization Q6H   levalbuterol  0.63 mg Nebulization Q6H   melatonin  5 mg Oral QHS   methylPREDNISolone (SOLU-MEDROL) injection  80 mg Intravenous Q24H   metoprolol tartrate  25 mg Oral BID   pantoprazole  40 mg Oral Daily   rosuvastatin  20 mg Oral Daily   tamsulosin  0.4 mg Oral Daily    Continuous Infusions:  diltiazem (CARDIZEM) infusion Stopped (08/13/22 0505)   heparin 1,000 Units/hr (08/13/22 0124)    PRN Medications:  acetaminophen, albuterol, clonazePAM, dextromethorphan-guaiFENesin, hydrALAZINE, metoprolol tartrate, nitroGLYCERIN, ondansetron (ZOFRAN) IV, polyethylene glycol  Antimicrobials from admission:  Anti-infectives (From admission, onward)    None           Data Reviewed:  I have personally reviewed the following...  CBC: Recent Labs  Lab 08/12/22 1224 08/13/22 0303  WBC 13.6* 8.1  NEUTROABS 10.6*  --    HGB 12.5* 11.0*  HCT 39.7 34.9*  MCV 98.8 98.6  PLT 317 222   Basic Metabolic Panel: Recent Labs  Lab 08/12/22 1224 08/13/22 0303  NA 140 137  K 3.8 4.7  CL 101 102  CO2 31 27  GLUCOSE 100* 169*  BUN 25* 27*  CREATININE 1.21 1.14  CALCIUM 8.7* 8.1*  MG 2.0  --    GFR: Estimated Creatinine Clearance: 52.2 mL/min (by C-G formula based on SCr of 1.14 mg/dL). Liver Function Tests: Recent Labs  Lab 08/12/22 1224  AST 22  ALT 14  ALKPHOS 50  BILITOT 0.9  PROT 6.4*  ALBUMIN 3.6   No results for input(s): "LIPASE", "AMYLASE" in the last 168 hours. No results  for input(s): "AMMONIA" in the last 168 hours. Coagulation Profile: Recent Labs  Lab 08/12/22 1606  INR 1.1   Cardiac Enzymes: No results for input(s): "CKTOTAL", "CKMB", "CKMBINDEX", "TROPONINI" in the last 168 hours. BNP (last 3 results) Recent Labs    05/30/22 1106  PROBNP 34.0   HbA1C: Recent Labs    08/13/22 0129  HGBA1C 5.0   CBG: No results for input(s): "GLUCAP" in the last 168 hours. Lipid Profile: Recent Labs    08/13/22 0303  CHOL 141  HDL 80  LDLCALC 52  TRIG 46  CHOLHDL 1.8   Thyroid Function Tests: Recent Labs    08/12/22 1438  TSH 0.700   Anemia Panel: No results for input(s): "VITAMINB12", "FOLATE", "FERRITIN", "TIBC", "IRON", "RETICCTPCT" in the last 72 hours. Most Recent Urinalysis On File:     Component Value Date/Time   COLORURINE YELLOW 11/16/2009 1130   APPEARANCEUR CLEAR 11/16/2009 1130   LABSPEC 1.017 11/16/2009 1130   PHURINE 7.5 11/16/2009 1130   GLUCOSEU NEGATIVE 11/16/2009 1130   HGBUR NEGATIVE 11/16/2009 1130   BILIRUBINUR NEGATIVE 11/16/2009 1130   KETONESUR NEGATIVE 11/16/2009 1130   PROTEINUR NEGATIVE 11/16/2009 1130   UROBILINOGEN 0.2 11/16/2009 1130   NITRITE NEGATIVE 11/16/2009 1130   LEUKOCYTESUR  11/16/2009 1130    NEGATIVE MICROSCOPIC NOT DONE ON URINES WITH NEGATIVE PROTEIN, BLOOD, LEUKOCYTES, NITRITE, OR GLUCOSE <1000 mg/dL.   Sepsis  Labs: '@LABRCNTIP'$ (procalcitonin:4,lacticidven:4) Microbiology: Recent Results (from the past 240 hour(s))  Resp panel by RT-PCR (RSV, Flu A&B, Covid) Anterior Nasal Swab     Status: Abnormal   Collection Time: 08/12/22 12:27 PM   Specimen: Anterior Nasal Swab  Result Value Ref Range Status   SARS Coronavirus 2 by RT PCR NEGATIVE NEGATIVE Final    Comment: (NOTE) SARS-CoV-2 target nucleic acids are NOT DETECTED.  The SARS-CoV-2 RNA is generally detectable in upper respiratory specimens during the acute phase of infection. The lowest concentration of SARS-CoV-2 viral copies this assay can detect is 138 copies/mL. A negative result does not preclude SARS-Cov-2 infection and should not be used as the sole basis for treatment or other patient management decisions. A negative result may occur with  improper specimen collection/handling, submission of specimen other than nasopharyngeal swab, presence of viral mutation(s) within the areas targeted by this assay, and inadequate number of viral copies(<138 copies/mL). A negative result must be combined with clinical observations, patient history, and epidemiological information. The expected result is Negative.  Fact Sheet for Patients:  EntrepreneurPulse.com.au  Fact Sheet for Healthcare Providers:  IncredibleEmployment.be  This test is no t yet approved or cleared by the [place] FDA and  has been authorized for detection and/or diagnosis of SARS-CoV-2 by FDA under an Emergency Use Authorization (EUA). This EUA will remain  in effect (meaning this test can be used) for the duration of the COVID-19 declaration under Section 564(b)(1) of the Act, 21 U.S.C.section 360bbb-3(b)(1), unless the authorization is terminated  or revoked sooner.       Influenza A by PCR NEGATIVE NEGATIVE Final   Influenza B by PCR NEGATIVE NEGATIVE Final    Comment: (NOTE) The Xpert Xpress SARS-CoV-2/FLU/RSV plus assay  is intended as an aid in the diagnosis of influenza from Nasopharyngeal swab specimens and should not be used as a sole basis for treatment. Nasal washings and aspirates are unacceptable for Xpert Xpress SARS-CoV-2/FLU/RSV testing.  Fact Sheet for Patients: EntrepreneurPulse.com.au  Fact Sheet for Healthcare Providers: IncredibleEmployment.be  This test is not yet approved or cleared by  the [employer] and has been authorized for detection and/or diagnosis of SARS-CoV-2 by FDA under an Emergency Use Authorization (EUA). This EUA will remain in effect (meaning this test can be used) for the duration of the COVID-19 declaration under Section 564(b)(1) of the Act, 21 U.S.C. section 360bbb-3(b)(1), unless the authorization is terminated or revoked.     Resp Syncytial Virus by PCR POSITIVE (A) NEGATIVE Final    Comment: (NOTE) Fact Sheet for Patients: EntrepreneurPulse.com.au  Fact Sheet for Healthcare Providers: IncredibleEmployment.be  This test is not yet approved or cleared by the [place] FDA and has been authorized for detection and/or diagnosis of SARS-CoV-2 by FDA under an Emergency Use Authorization (EUA). This EUA will remain in effect (meaning this test can be used) for the duration of the COVID-19 declaration under Section 564(b)(1) of the Act, 21 U.S.C. section 360bbb-3(b)(1), unless the authorization is terminated or revoked.  Performed at [facility], [address] [place], [place] [address]   Expectorated Sputum Assessment w Gram Stain, Rflx to Resp Cult     Status: None   Collection Time: 08/12/22  4:39 PM   Specimen: Sputum  Result Value Ref Range Status   Specimen Description SPUTUM  Final   Special Requests NONE  Final   Sputum evaluation   Final    Sputum specimen not acceptable for testing.  Please recollect.   NOTIFIED ASHLEY TREXLER ON 08/12/22 AT 1735  QSD Performed at [facility], [address] [place], [place] [address]    Report Status 08/12/2022 FINAL  Final  Expectorated Sputum Assessment w Gram Stain, Rflx to Resp Cult     Status: None   Collection Time: 08/13/22 10:52 AM   Specimen: Sputum  Result Value Ref Range Status   Specimen Description SPU  Final   Special Requests SPU  Final   Sputum evaluation   Final    THIS SPECIMEN IS ACCEPTABLE FOR SPUTUM CULTURE Performed at [facility], [address], [place], [place] [address]    Report Status 08/13/2022 FINAL  Final      Radiology Studies last 3 days: [facility] Chest Port 1 View  Result Date: 08/12/2022 CLINICAL DATA:  Shortness of breath. EXAM: PORTABLE CHEST 1 VIEW COMPARISON:  Two-view chest x-ray 07/18/2022 FINDINGS: Heart size is normal. Changes of COPD again noted. No superimposed airspace disease or edema is present. No significant effusions are present. Overlying defibrillator pads and leads are noted. IMPRESSION: 1. No acute cardiopulmonary disease. 2. COPD. Electronically Signed   By: San Morelle M.D.   On: 08/12/2022 12:54             LOS: 1 day    Time spent: 35 min    Emeterio Reeve, DO [facility] 08/13/2022, 1:55 PM    Dictation software may have been used to generate the above note. Typos may occur and escape review in typed/dictated notes. Please contact Dr Sheppard Coil directly for clarity if needed.  Staff may message me via secure chat in [place]  but this may not receive an immediate response,  please page me for urgent matters!  If 7PM-7AM, please contact night coverage [email or web address]

## 2022-08-13 NOTE — Consult Note (Signed)
ANTICOAGULATION CONSULT NOTE  Pharmacy Consult for heparin infusion Indication: atrial fibrillation  Allergies  Allergen Reactions   Flagyl [Metronidazole] Other (See Comments)    Other reaction(s): skin reaction    Patient Measurements: Weight: 72.6 kg (160 lb 0.9 oz) (recorded 06/2022) Heparin Dosing Weight: 72.6 kg  Vital Signs: Temp: 98 F (36.7 C) (01/14 1833) Temp Source: Oral (01/14 1833) BP: 110/62 (01/15 0130) Pulse Rate: 70 (01/15 0145)  Labs: Recent Labs    08/12/22 1224 08/12/22 1438 08/12/22 1606 08/12/22 1852 08/13/22 0129  HGB 12.5*  --   --   --   --   HCT 39.7  --   --   --   --   PLT 317  --   --   --   --   APTT  --   --  29  --   --   LABPROT  --   --  13.9  --   --   INR  --   --  1.1  --   --   HEPARINUNFRC  --   --   --   --  0.58  CREATININE 1.21  --   --   --   --   TROPONINIHS 31* 29*  --  25*  --      Estimated Creatinine Clearance: 49.2 mL/min (by C-G formula based on SCr of 1.21 mg/dL).   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   Chronic obstructive pulmonary disease with (acute) exacerbation ([place]) 12/07/2020   Chronic respiratory failure with hypoxia ([place]) 08/10/2019   Chronic sinusitis 12/07/2020   Cigarette nicotine dependence, uncomplicated 32/91/9166   Coagulation disorder ([place]) 01/13/2019   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    Contracture of palmar fascia 12/07/2020   COPD (chronic obstructive pulmonary disease) ([place])    Corn of toe 12/07/2020   Corns and callosities 04/09/2018   Coronary artery disease    2019 with stents   Coronary atherosclerosis 05/07/2018   Degenerative disc disease, 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   Degenerative disc disease, 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   Diarrhea 04/09/2018   Double vision with both eyes open 12/07/2020   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   Dysphagia 08/14/2018   Dyspnea    ED (erectile dysfunction)    ED (erectile dysfunction) of organic origin 12/07/2020   Elevated prostate specific antigen (PSA) 04/08/2018   Elevated PSA    Encounter for screening for other disorder 12/07/2020   Enlarged prostate without lower urinary tract symptoms (luts) 04/09/2018   Enthesopathy 12/07/2020   Essential (primary) hypertension 04/08/2018   Ex-smoker 04/09/2018   Exertional dyspnea 04/02/2018   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   Flatulence 04/08/2018   Gastro-esophageal reflux disease without esophagitis 04/08/2018   GERD (gastroesophageal reflux disease)    History of acute otitis externa 08/14/2018   Hyperlipidemia    Hypertension    Hypoxemia 12/07/2020  Kidney cysts    Left knee pain 12/07/2020   Low back pain 04/08/2018   Male erectile dysfunction 04/09/2018   Mixed hyperlipidemia 04/08/2018   Nocturia more than twice per night 04/09/2018   Osteoarthritis of first carpometacarpal joint 04/08/2018   Osteoarthritis of knee 12/07/2020   Other long term (current) drug therapy 12/07/2020   Pain due to onychomycosis of toenail of left foot 07/20/2019   Pain in joint 04/09/2018   Pain in knee 04/09/2018   Pain in unspecified knee 12/07/2020   Pain of finger 04/09/2018   Palpitations    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[place]) 12/07/2020   Personal history of colonic polyps 12/07/2020   Pneumonia    Pneumothorax 04/12/2020   Polypharmacy 04/09/2018   Porokeratosis  01/13/2019   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[place])    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   Pulmonary nodules 06/08/2016   Pure hypercholesterolemia 12/07/2020   Renal cyst 12/07/2020   Sleep disorder 04/08/2018   Status post bronchoscopy 04/12/2020   Tear of rotator cuff 04/09/2018   Tobacco user 83/15/1761   Uncomplicated alcohol dependence ([place]) 04/09/2018   Unspecified rotator cuff tear or rupture of unspecified shoulder, not specified as traumatic 12/07/2020   Unspecified tear of unspecified meniscus, current injury, unspecified knee, initial encounter 12/07/2020   Visual disturbance 12/07/2020   Vitamin D deficiency 04/08/2018    Medications:  No prior anticoagulation noted  Assessment: 82 y.o. male with medical history significant of HTN, CAD s/p stent presents with acute respiratory distress. Found to have new onset Afib with RVR.  Baseline labs ordered  Goal of Therapy:  Heparin level 0.3-0.7 units/ml Monitor platelets by anticoagulation protocol: Yes   01/15 0129 HL 0.58, therapeutic x 1 Plan:  Continue heparin infusion at 1000 units/hr Recheck HL in 8 hr to confirm Continue to monitor H&H and platelets  Renda Rolls, PharmD, [facility] 08/13/2022 2:17 AM

## 2022-08-14 ENCOUNTER — Other Ambulatory Visit (HOSPITAL_COMMUNITY): Payer: Self-pay

## 2022-08-14 DIAGNOSIS — I4892 Unspecified atrial flutter: Secondary | ICD-10-CM | POA: Diagnosis not present

## 2022-08-14 DIAGNOSIS — J9621 Acute and chronic respiratory failure with hypoxia: Secondary | ICD-10-CM | POA: Diagnosis not present

## 2022-08-14 DIAGNOSIS — I4891 Unspecified atrial fibrillation: Secondary | ICD-10-CM | POA: Diagnosis not present

## 2022-08-14 LAB — BASIC METABOLIC PANEL
Anion gap: 8 (ref 5–15)
BUN: 35 mg/dL — ABNORMAL HIGH (ref 8–23)
CO2: 28 mmol/L (ref 22–32)
Calcium: 8.3 mg/dL — ABNORMAL LOW (ref 8.9–10.3)
Chloride: 100 mmol/L (ref 98–111)
Creatinine, Ser: 1.11 mg/dL (ref 0.61–1.24)
GFR, Estimated: >60 mL/min (ref >60)
Glucose, Bld: 156 mg/dL — ABNORMAL HIGH (ref 70–99)
Potassium: 4.4 mmol/L (ref 3.5–5.1)
Sodium: 136 mmol/L (ref 135–145)

## 2022-08-14 LAB — CBC
HCT: 33.4 % — ABNORMAL LOW (ref 39.0–52.0)
Hemoglobin: 10.9 g/dL — ABNORMAL LOW (ref 13.0–17.0)
MCH: 31.2 pg (ref 26.0–34.0)
MCHC: 32.6 g/dL (ref 30.0–36.0)
MCV: 95.7 fL (ref 80.0–100.0)
Platelets: 255 10*3/uL (ref 150–400)
RBC: 3.49 MIL/uL — ABNORMAL LOW (ref 4.22–5.81)
RDW: 14.9 % (ref 11.5–15.5)
WBC: 11.7 10*3/uL — ABNORMAL HIGH (ref 4.0–10.5)
nRBC: 0 % (ref 0.0–0.2)

## 2022-08-14 LAB — HEPARIN LEVEL (UNFRACTIONATED): Heparin Unfractionated: 0.88 IU/mL — ABNORMAL HIGH (ref 0.30–0.70)

## 2022-08-14 MED ORDER — DILTIAZEM HCL 30 MG PO TABS
45.0000 mg | ORAL_TABLET | Freq: Four times a day (QID) | ORAL | Status: DC
  Administered 2022-08-14 – 2022-08-15 (×4): 45 mg via ORAL
  Filled 2022-08-14: qty 0.5
  Filled 2022-08-14: qty 2
  Filled 2022-08-14 (×2): qty 0.5
  Filled 2022-08-14 (×2): qty 2

## 2022-08-14 MED ORDER — FUROSEMIDE 10 MG/ML IJ SOLN
20.0000 mg | Freq: Once | INTRAMUSCULAR | Status: AC
  Administered 2022-08-14: 20 mg via INTRAVENOUS
  Filled 2022-08-14: qty 4

## 2022-08-14 MED ORDER — APIXABAN 5 MG PO TABS
5.0000 mg | ORAL_TABLET | Freq: Two times a day (BID) | ORAL | Status: DC
  Administered 2022-08-14 – 2022-08-20 (×13): 5 mg via ORAL
  Filled 2022-08-14 (×13): qty 1

## 2022-08-14 NOTE — Progress Notes (Signed)
Rounding Note    Patient Name: [name] [name]. Date of Encounter: 08/14/2022  [facility] Cardiologist: Jenean Lindau, MD   Subjective   Continues to feel short of breath today, similar to yesterday.  No chest pain or palpitations.  Wonders about how his atrial fibrillation/flutter is going to be managed and its impact on his breathing.  Inpatient Medications    Scheduled Meds:  apixaban  5 mg Oral BID   diltiazem  30 mg Oral Q6H   finasteride  5 mg Oral Daily   gabapentin  100 mg Oral BID   ipratropium  0.5 mg Nebulization Q6H   levalbuterol  0.63 mg Nebulization Q6H   melatonin  5 mg Oral QHS   methylPREDNISolone (SOLU-MEDROL) injection  80 mg Intravenous Q24H   pantoprazole  40 mg Oral Daily   rosuvastatin  20 mg Oral Daily   tamsulosin  0.4 mg Oral Daily   Continuous Infusions:  diltiazem (CARDIZEM) infusion Stopped (08/14/22 0838)   PRN Meds: acetaminophen, albuterol, clonazePAM, dextromethorphan-guaiFENesin, metoprolol tartrate, nitroGLYCERIN, ondansetron (ZOFRAN) IV, polyethylene glycol   Vital Signs    Vitals:   08/14/22 0645 08/14/22 0700 08/14/22 0800 08/14/22 0900  BP: (!) 120/100 (!) 96/42 (!) 107/51 137/67  Pulse: 88 (!) 59 60 89  Resp: (!) 21 18 (!) 22 (!) 27  Temp:    97.6 F (36.4 C)  TempSrc:    Oral  SpO2: 98% 99% 100% 100%  Weight:        Intake/Output Summary (Last 24 hours) at 08/14/2022 1011 Last data filed at 08/14/2022 0536 Gross per 24 hour  Intake 179.1 ml  Output 1500 ml  Net -1320.9 ml      08/12/2022    2:32 PM 07/09/2022   10:40 AM 07/05/2022    9:30 AM  Last 3 Weights  Weight (lbs) 160 lb 0.9 oz 160 lb 160 lb  Weight (kg) 72.6 kg 72.576 kg 72.576 kg      Telemetry    Atrial flutter with variable AV conduction.  Ventricular rates mostly 80-110 bpm (extreme tachycardia alarms on telemetry are due to counting of flutter waves). - Personally Reviewed  ECG    No new tracing.  Physical Exam   GEN:  Mild respiratory distress noted with increased work of breathing. Neck: No JVD Cardiac: Irregular rhythm with distant heart sounds.  No obvious murmurs appreciated. Respiratory: Diminished breath sound throughout with decreased air movement and diffuse expiratory wheezing. GI: Soft, nontender, non-distended  MS: No edema; No deformity. Neuro:  Nonfocal  Psych: Normal affect   Labs    High Sensitivity Troponin:   Recent Labs  Lab 08/12/22 1224 08/12/22 1438 08/12/22 1852  TROPONINIHS 31* 29* 25*     Chemistry Recent Labs  Lab 08/12/22 1224 08/13/22 0303 08/14/22 0501  NA 140 137 136  K 3.8 4.7 4.4  CL 101 102 100  CO2 '31 27 28  '$ GLUCOSE 100* 169* 156*  BUN 25* 27* 35*  CREATININE 1.21 1.14 1.11  CALCIUM 8.7* 8.1* 8.3*  MG 2.0  --   --   PROT 6.4*  --   --   ALBUMIN 3.6  --   --   AST 22  --   --   ALT 14  --   --   ALKPHOS 50  --   --   BILITOT 0.9  --   --   GFRNONAA >60 >60 >60  ANIONGAP '8 8 8    '$ Lipids  Recent Labs  Lab 08/13/22 0303  CHOL 141  TRIG 46  HDL 80  LDLCALC 52  CHOLHDL 1.8    Hematology Recent Labs  Lab 08/12/22 1224 08/13/22 0303 08/14/22 0501  WBC 13.6* 8.1 11.7*  RBC 4.02* 3.54* 3.49*  HGB 12.5* 11.0* 10.9*  HCT 39.7 34.9* 33.4*  MCV 98.8 98.6 95.7  MCH 31.1 31.1 31.2  MCHC 31.5 31.5 32.6  RDW 14.9 15.0 14.9  PLT 317 232 255   Thyroid  Recent Labs  Lab 08/12/22 1438  TSH 0.700    BNP Recent Labs  Lab 08/12/22 1227  BNP 61.0    DDimer No results for input(s): "DDIMER" in the last 168 hours.   Radiology    DG Chest Port 1 View  Result Date: 08/12/2022 CLINICAL DATA:  Shortness of breath. EXAM: PORTABLE CHEST 1 VIEW COMPARISON:  Two-view chest x-ray 07/18/2022 FINDINGS: Heart size is normal. Changes of COPD again noted. No superimposed airspace disease or edema is present. No significant effusions are present. Overlying defibrillator pads and leads are noted. IMPRESSION: 1. No acute cardiopulmonary disease. 2. COPD.  Electronically Signed   By: San Morelle M.D.   On: 08/12/2022 12:54    Cardiac Studies   TTE (08/06/2022):  1. Tachycardia noted. Left ventricular ejection fraction, by estimation,  is 65 to 70%. The left ventricle has normal function. The left ventricle  has no regional wall motion abnormalities. There is mild left ventricular  hypertrophy. Left ventricular  diastolic parameters are indeterminate. There is the interventricular  septum is flattened in systole and diastole, consistent with right  ventricular pressure and volume overload.   2. Right ventricular systolic function is normal. The right ventricular  size is normal. Tricuspid regurgitation signal is inadequate for assessing  PA pressure.   3. The mitral valve is normal in structure. No evidence of mitral valve  regurgitation. No evidence of mitral stenosis.   4. The aortic valve is normal in structure. Aortic valve regurgitation is  not visualized. No aortic stenosis is present.   5. The inferior vena cava is normal in size with greater than 50%  respiratory variability, suggesting right atrial pressure of 3 mmHg.   Patient Profile     82 y.o. 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  Assessment & Plan    Atrial fibrillation/flutter: Mr. Dehaas remains in atrial flutter with ventricular rates relatively well-controlled, typically 80-110 bpm.  I think it would be best to continue with a rate control strategy in the setting of his acute respiratory illness, as he would be high risk for decompensation with sedation needed for TEE/DCCV.  He would also be at elevated risk for recurrent atrial fibrillation/flutter in the short-term.  He is currently on oral diltiazem only, as  IV diltiazem precipitated hypotension. -Increase diltiazem to 45 mg every 6 hours. -Continue apixaban 5 mg twice daily. -Anticipate outpatient cardioversion once acute respiratory failure/RSV infection have resolved and the patient has completed 4 weeks of uninterrupted anticoagulation.  Acute on chronic respiratory failure with hypoxia: Mr. Graca demonstrates increased work of breathing today and has notable wheezing and diminished air movement on exam.  I suspect that his COPD exacerbation in the setting of RSV infection is driving this.  It should be noted that his echocardiogram earlier this month showed some  secondary evidence of underlying pulmonary hypertension, though PA pressures could not be measured. -Continue steroids and nebulizers per primary team. -Redosed furosemide 20 mg IV x 1 today.  Coronary artery disease: No angina reported.  Minimal elevation in high-sensitivity troponin I this admission is not consistent with acute coronary syndrome and most likely reflects supply-demand mismatch in the setting of his acute on chronic respiratory failure from RSV infection and atrial fibrillation/flutter. -Continue rosuvastatin for secondary prevention as well as apixaban and lieu of aspirin in the setting of new atrial fibrillation/flutter.  For questions or updates, please contact [place] Please consult [email or web address] for contact info under [facility].     Signed, Nelva Bush, MD  08/14/2022, 10:11 AM

## 2022-08-14 NOTE — Progress Notes (Signed)
PROGRESS NOTE    [name] [name].   WSF:[id number] DOB: 1941-01-29  DOA: 08/12/2022 Date of Service: 08/14/22 PCP: [name], MD     Brief Narrative / Hospital Course:  [name] [name]. is a 82 y.o. 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  01/14: Patient was found to have severe respiratory distress and severe tachycardia with heart rates up to 180s.  Found to have new onset atrial fibrillation.  He cannot speak in full sentence, using accessory muscle for breathing, initially started on CPAP but still has respiratory distress.  BiPAP is started in the ED. (+)PCR for RSV, BNP 61, lactic acid normal 1.7, WBC 13.6, GFR> 60, blood pressure 130/59, RR 28.  ABG with pH 7.32, CO2 68, O2 43.  Chest x-ray showed COPD without infiltration.  Patient is admitted to stepdown as inpatient. Later in the afternoon was transitioned to [place] O2 2L/min 01/15: maintaining on [place] O2 2-4L. HR improved. Cardizem gtt d/c overnight d/t bradycardia. Afib --> Aflutter. Remains on heparin gtt. Cardiology [facility]) saw patient - "If remains symptomatic with shortness of breath and palpitations can consider TEE/DCCV"  01/16: remains significantly SOB, titrating rate control meds   Consultants:  Cardiology  Procedures: none      ASSESSMENT & PLAN:   Principal Problem:   RSV infection Active Problems:   Acute on chronic respiratory failure with hypoxia (HCC)   COPD exacerbation ([place])   Atrial fibrillation with RVR ([place])   Coronary artery disease   Myocardial injury   Hypertension   Hyperlipidemia   BPH (benign prostatic hyperplasia)   Anxiety  Acute on chronic respiratory failure with hypoxia (on 2-4L O2 at home)  RSV infection  RSV induced COPD exacerbation:  BiPAP --> [place]. Bronchodilators was given 2 g of magnesium sulfate in  ED Solu-Medrol 40 mg IV bid Mucinex for cough  Incentive spirometry sputum culture Supplemental O2 - he is usually on 2-4L/min at home  Reports he is about at baseline, still significant SOB, consider ambulatory walk test tomorrow  Per cardiology, Lasix 20 mg IV x1 today    Atrial fibrillation with RVR, new onset:  HR is up to 180s.   CHADS2 score is 4. Will need anticoagulants.    Patient had 2D echo 08/06/2022 which showed EF of 65 to 70% Cardizem drip d/c Rate control w/ po Cardizem -- increased today to 45 mg q6h IV metoprolol 5 mg every 2 hours for heart rate> 125 IV heparin --> Eliquis check TSH Cardiology [facility]) following   Anticipate outpatient cardioversion once acute respiratory failure/RSV infection have resolved and the patient has completed 4 weeks of uninterrupted anticoagulation    Mild volume overload s/p Lasix IV x1 per cardiology   Coronary artery disease and Myocardial injury, ACS ruled out:  Trend troponin --> flat  Continue Crestor D/c Plavix now that on Eliquis Check A1c, FLP prn NTG D/c ASA    Hypertension IV hydralazine as needed Hold Diovan since patient was started on diltiazem drip and metoprolol, now is on po diltiazem for rate control    Hyperlipidemia Crestor   BPH (benign prostatic hyperplasia) Proscar and Flomax   Anxiety As needed Klonopin   DVT prophylaxis: heparin gtt off today, transition to Eliquis Pertinent IV fluids/nutrition: no continuous IV fludis  Central lines / invasive devices: none  Code  Status: FULL CODE   Current Admission Status: inpatient  TOC needs / Dispo plan: none at this time, anticipate back home Barriers to discharge / significant pending items: cardiac stabilization and cardiology clearance, wean to home O2              Subjective / Brief ROS:  Patient reports significant SOB w/ minimal activity Denies CP  Pain controlled.  Denies new weakness.  Tolerating diet.  Reports no concerns w/  urination/defecation.   Family Communication: wife at bedside on rounds     Objective Findings:  Vitals:   08/14/22 1000 08/14/22 1100 08/14/22 1357 08/14/22 1420  BP: 125/63 (!) 123/93 129/80 137/82  Pulse: 90 87  100  Resp: (!) 23 18  (!) 22  Temp:    97.6 F (36.4 C)  TempSrc:      SpO2: 100% 99%  95%  Weight:        Intake/Output Summary (Last 24 hours) at 08/14/2022 1618 Last data filed at 08/14/2022 0536 Gross per 24 hour  Intake 179.1 ml  Output 1500 ml  Net -1320.9 ml   Filed Weights   08/12/22 1432  Weight: 72.6 kg    Examination:  Physical Exam Constitutional:      General: He is not in acute distress.    Appearance: He is well-developed. He is not ill-appearing.  Cardiovascular:     Rate and Rhythm: Normal rate. Rhythm irregular.  Pulmonary:     Effort: Pulmonary effort is normal. No respiratory distress.     Breath sounds: Examination of the right-upper field reveals wheezing. Examination of the left-upper field reveals wheezing. Examination of the right-lower field reveals decreased breath sounds. Examination of the left-lower field reveals decreased breath sounds. Decreased breath sounds and wheezing present.  Musculoskeletal:     Right lower leg: No edema.     Left lower leg: No edema.  Skin:    General: Skin is warm and dry.  Neurological:     General: No focal deficit present.     Mental Status: He is alert and oriented to person, place, and time.  Psychiatric:        Mood and Affect: Mood normal.        Behavior: Behavior normal.          Scheduled Medications:   apixaban  5 mg Oral BID   diltiazem  45 mg Oral Q6H   finasteride  5 mg Oral Daily   gabapentin  100 mg Oral BID   ipratropium  0.5 mg Nebulization Q6H   levalbuterol  0.63 mg Nebulization Q6H   melatonin  5 mg Oral QHS   methylPREDNISolone (SOLU-MEDROL) injection  80 mg Intravenous Q24H   pantoprazole  40 mg Oral Daily   rosuvastatin  20 mg Oral Daily   tamsulosin  0.4  mg Oral Daily    Continuous Infusions:    PRN Medications:  acetaminophen, albuterol, clonazePAM, dextromethorphan-guaiFENesin, nitroGLYCERIN, ondansetron (ZOFRAN) IV, polyethylene glycol  Antimicrobials from admission:  Anti-infectives (From admission, onward)    None           Data Reviewed:  I have personally reviewed the following...  CBC: Recent Labs  Lab 08/12/22 1224 08/13/22 0303 08/14/22 0501  WBC 13.6* 8.1 11.7*  NEUTROABS 10.6*  --   --   HGB 12.5* 11.0* 10.9*  HCT 39.7 34.9* 33.4*  MCV 98.8 98.6 95.7  PLT 317 232 323   Basic Metabolic Panel: Recent Labs  Lab 08/12/22 1224 08/13/22 0303  08/14/22 0501  NA 140 137 136  K 3.8 4.7 4.4  CL 101 102 100  CO2 '31 27 28  '$ GLUCOSE 100* 169* 156*  BUN 25* 27* 35*  CREATININE 1.21 1.14 1.11  CALCIUM 8.7* 8.1* 8.3*  MG 2.0  --   --    GFR: Estimated Creatinine Clearance: 53.6 mL/min (by C-G formula based on SCr of 1.11 mg/dL). Liver Function Tests: Recent Labs  Lab 08/12/22 1224  AST 22  ALT 14  ALKPHOS 50  BILITOT 0.9  PROT 6.4*  ALBUMIN 3.6   No results for input(s): "LIPASE", "AMYLASE" in the last 168 hours. No results for input(s): "AMMONIA" in the last 168 hours. Coagulation Profile: Recent Labs  Lab 08/12/22 1606  INR 1.1   Cardiac Enzymes: No results for input(s): "CKTOTAL", "CKMB", "CKMBINDEX", "TROPONINI" in the last 168 hours. BNP (last 3 results) Recent Labs    05/30/22 1106  PROBNP 34.0   HbA1C: Recent Labs    08/13/22 0129  HGBA1C 5.0   CBG: No results for input(s): "GLUCAP" in the last 168 hours. Lipid Profile: Recent Labs    08/13/22 0303  CHOL 141  HDL 80  LDLCALC 52  TRIG 46  CHOLHDL 1.8   Thyroid Function Tests: Recent Labs    08/12/22 1438  TSH 0.700   Anemia Panel: No results for input(s): "VITAMINB12", "FOLATE", "FERRITIN", "TIBC", "IRON", "RETICCTPCT" in the last 72 hours. Most Recent Urinalysis On File:     Component Value Date/Time    COLORURINE YELLOW 11/16/2009 1130   APPEARANCEUR CLEAR 11/16/2009 1130   LABSPEC 1.017 11/16/2009 1130   PHURINE 7.5 11/16/2009 1130   GLUCOSEU NEGATIVE 11/16/2009 1130   HGBUR NEGATIVE 11/16/2009 1130   BILIRUBINUR NEGATIVE 11/16/2009 1130   KETONESUR NEGATIVE 11/16/2009 1130   PROTEINUR NEGATIVE 11/16/2009 1130   UROBILINOGEN 0.2 11/16/2009 1130   NITRITE NEGATIVE 11/16/2009 1130   LEUKOCYTESUR  11/16/2009 1130    NEGATIVE MICROSCOPIC NOT DONE ON URINES WITH NEGATIVE PROTEIN, BLOOD, LEUKOCYTES, NITRITE, OR GLUCOSE <1000 mg/dL.   Sepsis Labs: '@LABRCNTIP'$ (procalcitonin:4,lacticidven:4) Microbiology: Recent Results (from the past 240 hour(s))  Resp panel by RT-PCR (RSV, Flu A&B, Covid) Anterior Nasal Swab     Status: Abnormal   Collection Time: 08/12/22 12:27 PM   Specimen: Anterior Nasal Swab  Result Value Ref Range Status   SARS Coronavirus 2 by RT PCR NEGATIVE NEGATIVE Final    Comment: (NOTE) SARS-CoV-2 target nucleic acids are NOT DETECTED.  The SARS-CoV-2 RNA is generally detectable in upper respiratory specimens during the acute phase of infection. The lowest concentration of SARS-CoV-2 viral copies this assay can detect is 138 copies/mL. A negative result does not preclude SARS-Cov-2 infection and should not be used as the sole basis for treatment or other patient management decisions. A negative result may occur with  improper specimen collection/handling, submission of specimen other than nasopharyngeal swab, presence of viral mutation(s) within the areas targeted by this assay, and inadequate number of viral copies(<138 copies/mL). A negative result must be combined with clinical observations, patient history, and epidemiological information. The expected result is Negative.  Fact Sheet for Patients:  EntrepreneurPulse.com.au  Fact Sheet for Healthcare Providers:  IncredibleEmployment.be  This test is no t yet approved or  cleared by the [place] FDA and  has been authorized for detection and/or diagnosis of SARS-CoV-2 by FDA under an Emergency Use Authorization (EUA). This EUA will remain  in effect (meaning this test can be used) for the duration of the COVID-19 declaration  under Section 564(b)(1) of the Act, 21 U.S.C.section 360bbb-3(b)(1), unless the authorization is terminated  or revoked sooner.       Influenza A by PCR NEGATIVE NEGATIVE Final   Influenza B by PCR NEGATIVE NEGATIVE Final    Comment: (NOTE) The Xpert Xpress SARS-CoV-2/FLU/RSV plus assay is intended as an aid in the diagnosis of influenza from Nasopharyngeal swab specimens and should not be used as a sole basis for treatment. Nasal washings and aspirates are unacceptable for Xpert Xpress SARS-CoV-2/FLU/RSV testing.  Fact Sheet for Patients: EntrepreneurPulse.com.au  Fact Sheet for Healthcare Providers: IncredibleEmployment.be  This test is not yet approved or cleared by the [place] FDA and has been authorized for detection and/or diagnosis of SARS-CoV-2 by FDA under an Emergency Use Authorization (EUA). This EUA will remain in effect (meaning this test can be used) for the duration of the COVID-19 declaration under Section 564(b)(1) of the Act, 21 U.S.C. section 360bbb-3(b)(1), unless the authorization is terminated or revoked.     Resp Syncytial Virus by PCR POSITIVE (A) NEGATIVE Final    Comment: (NOTE) Fact Sheet for Patients: EntrepreneurPulse.com.au  Fact Sheet for Healthcare Providers: IncredibleEmployment.be  This test is not yet approved or cleared by the [place] FDA and has been authorized for detection and/or diagnosis of SARS-CoV-2 by FDA under an Emergency Use Authorization (EUA). This EUA will remain in effect (meaning this test can be used) for the duration of the COVID-19 declaration under Section 564(b)(1) of the Act,  21 U.S.C. section 360bbb-3(b)(1), unless the authorization is terminated or revoked.  Performed at [facility], [address] [place], [place] [address]   Expectorated Sputum Assessment w Gram Stain, Rflx to Resp Cult     Status: None   Collection Time: 08/12/22  4:39 PM   Specimen: Sputum  Result Value Ref Range Status   Specimen Description SPUTUM  Final   Special Requests NONE  Final   Sputum evaluation   Final    Sputum specimen not acceptable for testing.  Please recollect.   NOTIFIED ASHLEY TREXLER ON 08/12/22 AT 1735 QSD Performed at [facility], [address] [place], [place] [address]    Report Status 08/12/2022 FINAL  Final  Expectorated Sputum Assessment w Gram Stain, Rflx to Resp Cult     Status: None   Collection Time: 08/13/22 10:52 AM   Specimen: Sputum  Result Value Ref Range Status   Specimen Description SPU  Final   Special Requests SPU  Final   Sputum evaluation   Final    THIS SPECIMEN IS ACCEPTABLE FOR SPUTUM CULTURE Performed at [facility], [address], [place], [place] [address]    Report Status 08/13/2022 FINAL  Final  Culture, Respiratory w Gram Stain     Status: None (Preliminary result)   Collection Time: 08/13/22 10:52 AM   Specimen: Sputum  Result Value Ref Range Status   Specimen Description   Final    SPU Performed at [facility], [address], [place], [place] [address]    Special Requests   Final    SPU Reflexed from [id number] Performed at [facility], [place], [place] [address]    Gram Stain   Final    RARE WBC PRESENT, PREDOMINANTLY PMN MODERATE GRAM POSITIVE COCCI IN CHAINS FEW GRAM NEGATIVE RODS FEW YEAST PREVIOUSLY REPORTED AS: MODERATE GRAM NEGATIVE RODS MODERATE WBC PRESENT, PREDOMINANTLY PMN CORRECTED RESULTS CALLED TO:  C/  A. DAVIS, RN 08/13/22 2023 A.  [place]    Culture   Final    CULTURE REINCUBATED FOR BETTER GROWTH Performed at [facility], [place] [address] [place], [place] [address]    Report Status PENDING  Incomplete      Radiology Studies last 3 days: DG Chest Port 1 View  Result Date: 08/12/2022 CLINICAL DATA:  Shortness of breath. EXAM: PORTABLE CHEST 1 VIEW COMPARISON:  Two-view chest x-ray 07/18/2022 FINDINGS: Heart size is normal. Changes of COPD again noted. No superimposed airspace disease or edema is present. No significant effusions are present. Overlying defibrillator pads and leads are noted. IMPRESSION: 1. No acute cardiopulmonary disease. 2. COPD. Electronically Signed   By: San Morelle M.D.   On: 08/12/2022 12:54             LOS: 2 days        Emeterio Reeve, DO [facility] 08/14/2022, 4:18 PM    Dictation software may have been used to generate the above note. Typos may occur and escape review in typed/dictated notes. Please contact Dr Sheppard Coil directly for clarity if needed.  Staff may message me via secure chat in [place]  but this may not receive an immediate response,  please page me for urgent matters!  If 7PM-7AM, please contact night coverage [email or web address]

## 2022-08-14 NOTE — Consult Note (Signed)
ANTICOAGULATION CONSULT NOTE  Pharmacy Consult for heparin infusion Indication: atrial fibrillation   Patient Measurements: Weight: 72.6 kg (160 lb 0.9 oz) (recorded 06/2022) Heparin Dosing Weight: 72.6 kg  Vital Signs: Temp: 97.9 F (36.6 C) (01/16 0246) Temp Source: Oral (01/16 0246) BP: 124/68 (01/16 0445) Pulse Rate: 91 (01/16 0445)   Estimated Creatinine Clearance: 53.6 mL/min (by C-G formula based on SCr of 1.11 mg/dL).    Medications:  No prior anticoagulation noted  Assessment: 82 y.o. male with medical history significant of HTN, CAD s/p stent presents with acute respiratory distress. Found to have new onset Afib with RVR.  Baseline labs: aPTT 29, INR 1.1, Hgb 12.5, Hct 39.7, Plts 317  Goal of Therapy:  Heparin level 0.3-0.7 units/ml Monitor platelets by anticoagulation protocol: Yes   01/15 0129 HL 0.58, therapeutic x 1 01/15 1047 HL 0.56, therapeutic x 2 01/16 0501 HL 0.88, elevated  Plan:  1/16: HL @ 0501 = 0.88, elevated  Will decrease heparin drip rate to 850 units/hr. Will recheck HL 8 hrs after rate change   Ladarious Kresse D 08/14/2022 5:46 AM

## 2022-08-14 NOTE — TOC Benefit Eligibility Note (Signed)
Patient Teacher, English as a foreign language completed.    The patient is currently admitted and upon discharge could be taking Eliquis.  The current 30 day co-pay is $47.00.   The patient is insured through [facility] Part D

## 2022-08-14 NOTE — Progress Notes (Signed)
ATC x2.  LVM on home and mobile # to return call.

## 2022-08-15 ENCOUNTER — Ambulatory Visit: Payer: Medicare Other | Admitting: Emergency Medicine

## 2022-08-15 DIAGNOSIS — I2510 Atherosclerotic heart disease of native coronary artery without angina pectoris: Secondary | ICD-10-CM | POA: Diagnosis not present

## 2022-08-15 DIAGNOSIS — I4892 Unspecified atrial flutter: Secondary | ICD-10-CM | POA: Diagnosis not present

## 2022-08-15 DIAGNOSIS — B338 Other specified viral diseases: Secondary | ICD-10-CM | POA: Diagnosis not present

## 2022-08-15 LAB — BASIC METABOLIC PANEL
Anion gap: 7 (ref 5–15)
BUN: 41 mg/dL — ABNORMAL HIGH (ref 8–23)
CO2: 29 mmol/L (ref 22–32)
Calcium: 8.3 mg/dL — ABNORMAL LOW (ref 8.9–10.3)
Chloride: 99 mmol/L (ref 98–111)
Creatinine, Ser: 1.04 mg/dL (ref 0.61–1.24)
GFR, Estimated: >60 mL/min (ref >60)
Glucose, Bld: 144 mg/dL — ABNORMAL HIGH (ref 70–99)
Potassium: 4.4 mmol/L (ref 3.5–5.1)
Sodium: 135 mmol/L (ref 135–145)

## 2022-08-15 MED ORDER — POLYETHYLENE GLYCOL 3350 17 G PO PACK
17.0000 g | PACK | Freq: Every day | ORAL | Status: DC
  Administered 2022-08-15 – 2022-08-19 (×4): 17 g via ORAL
  Filled 2022-08-15 (×5): qty 1

## 2022-08-15 MED ORDER — DILTIAZEM HCL ER COATED BEADS 180 MG PO CP24
180.0000 mg | ORAL_CAPSULE | Freq: Every day | ORAL | Status: DC
  Administered 2022-08-15 – 2022-08-16 (×2): 180 mg via ORAL
  Filled 2022-08-15 (×2): qty 1

## 2022-08-15 NOTE — Progress Notes (Signed)
Rounding Note    Patient Name: [name] [name]. Date of Encounter: 08/15/2022  [facility] Cardiologist: Jenean Lindau, MD   Subjective   Endorses shortness of breath, cough, wheezing.  Shortness of breath gets worse with ambulation.  Inpatient Medications    Scheduled Meds:  apixaban  5 mg Oral BID   diltiazem  45 mg Oral Q6H   finasteride  5 mg Oral Daily   gabapentin  100 mg Oral BID   ipratropium  0.5 mg Nebulization Q6H   levalbuterol  0.63 mg Nebulization Q6H   melatonin  5 mg Oral QHS   methylPREDNISolone (SOLU-MEDROL) injection  80 mg Intravenous Q24H   pantoprazole  40 mg Oral Daily   polyethylene glycol  17 g Oral Daily   rosuvastatin  20 mg Oral Daily   tamsulosin  0.4 mg Oral Daily   Continuous Infusions:  PRN Meds: acetaminophen, albuterol, clonazePAM, dextromethorphan-guaiFENesin, nitroGLYCERIN, ondansetron (ZOFRAN) IV   Vital Signs    Vitals:   08/15/22 0248 08/15/22 0501 08/15/22 0747 08/15/22 0759  BP:  (!) 118/59  (!) 141/76  Pulse:  81  89  Resp:  20  20  Temp:  97.8 F (36.6 C)    TempSrc:      SpO2: 99% 100% 97% 100%  Weight:        Intake/Output Summary (Last 24 hours) at 08/15/2022 1125 Last data filed at 08/15/2022 0855 Gross per 24 hour  Intake 45.44 ml  Output 1100 ml  Net -1054.56 ml      08/12/2022    2:32 PM 07/09/2022   10:40 AM 07/05/2022    9:30 AM  Last 3 Weights  Weight (lbs) 160 lb 0.9 oz 160 lb 160 lb  Weight (kg) 72.6 kg 72.576 kg 72.576 kg      Telemetry    Atrial flutter heart rate 89- Personally Reviewed  ECG     - Personally Reviewed  Physical Exam   GEN: No acute distress.   Neck: No JVD Cardiac: Regular rhythm Respiratory: Bilateral wheezing, rhonchi GI: Soft, nontender, non-distended  MS: No edema; No deformity. Neuro:  Nonfocal  Psych: Normal affect   Labs    High Sensitivity Troponin:   Recent Labs  Lab 08/12/22 1224 08/12/22 1438 08/12/22 1852  TROPONINIHS 31* 29*  25*     Chemistry Recent Labs  Lab 08/12/22 1224 08/13/22 0303 08/14/22 0501 08/15/22 0514  NA 140 137 136 135  K 3.8 4.7 4.4 4.4  CL 101 102 100 99  CO2 '31 27 28 29  '$ GLUCOSE 100* 169* 156* 144*  BUN 25* 27* 35* 41*  CREATININE 1.21 1.14 1.11 1.04  CALCIUM 8.7* 8.1* 8.3* 8.3*  MG 2.0  --   --   --   PROT 6.4*  --   --   --   ALBUMIN 3.6  --   --   --   AST 22  --   --   --   ALT 14  --   --   --   ALKPHOS 50  --   --   --   BILITOT 0.9  --   --   --   GFRNONAA >60 >60 >60 >60  ANIONGAP '8 8 8 7    '$ Lipids  Recent Labs  Lab 08/13/22 0303  CHOL 141  TRIG 46  HDL 80  LDLCALC 52  CHOLHDL 1.8    Hematology Recent Labs  Lab 08/12/22 1224 08/13/22 0303 08/14/22 0501  WBC 13.6* 8.1 11.7*  RBC 4.02* 3.54* 3.49*  HGB 12.5* 11.0* 10.9*  HCT 39.7 34.9* 33.4*  MCV 98.8 98.6 95.7  MCH 31.1 31.1 31.2  MCHC 31.5 31.5 32.6  RDW 14.9 15.0 14.9  PLT 317 232 255   Thyroid  Recent Labs  Lab 08/12/22 1438  TSH 0.700    BNP Recent Labs  Lab 08/12/22 1227  BNP 61.0    DDimer No results for input(s): "DDIMER" in the last 168 hours.   Radiology    No results found.  Cardiac Studies   TTE 08/06/2022  1. Tachycardia noted. Left ventricular ejection fraction, by estimation,  is 65 to 70%. The left ventricle has normal function. The left ventricle  has no regional wall motion abnormalities. There is mild left ventricular  hypertrophy. Left ventricular  diastolic parameters are indeterminate. There is the interventricular  septum is flattened in systole and diastole, consistent with right  ventricular pressure and volume overload.   2. Right ventricular systolic function is normal. The right ventricular  size is normal. Tricuspid regurgitation signal is inadequate for assessing  PA pressure.   3. The mitral valve is normal in structure. No evidence of mitral valve  regurgitation. No evidence of mitral stenosis.   4. The aortic valve is normal in structure. Aortic  valve regurgitation is  not visualized. No aortic stenosis is present.   5. The inferior vena cava is normal in size with greater than 50%  respiratory variability, suggesting right atrial pressure of 3 mmHg.   Patient Profile     82 y.o. male with history of COPD on home oxygen, respiratory failure, persistent atrial fibrillation/flutter, CAD/PCI's, hypertension presenting with shortness of breath, diagnosed with RSV, being seen for A-fib/flutter  Assessment & Plan    Persistent A-fib/flutter -Currently in atrial flutter, heart rate controlled -Consolidate Cardizem to 180 mg daily.  Continue Eliquis -Plan DC cardioversion as outpatient after acute illness  2.  CAD/PCI's -Denies chest pain -cont Crestor, Eliquis  3.  RSV, COPD, acute on chronic respiratory dysfunction -Oxygen supplementation, treatment as per pulmonology  Total encounter time more than 50 minutes  Greater than 50% was spent in counseling and coordination of care with the patient      Signed, Kate Sable, MD  08/15/2022, 11:25 AM

## 2022-08-15 NOTE — Progress Notes (Signed)
PROGRESS NOTE    [name]  PYK:[id number] DOB: 06/21/1941 DOA: 08/12/2022 PCP: [name], MD   Brief Narrative:  This 82 y.o. 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  01/14: Patient was found to have severe respiratory distress and severe tachycardia with heart rates up to 180s.  Found to have new onset atrial fibrillation.  He cannot speak in full sentence, using accessory muscle for breathing, initially started on CPAP but still has respiratory distress.  BiPAP is started in the ED. (+)PCR for RSV, BNP 61, lactic acid normal 1.7, WBC 13.6, GFR> 60, blood pressure 130/59, RR 28.  ABG with pH 7.32, CO2 68, O2 43.  Chest x-ray showed COPD without infiltration.  Patient is admitted to stepdown as inpatient. Later in the afternoon was transitioned to [place] O2 2L/min 01/15: maintaining on [place] O2 2-4L. HR improved. Cardizem gtt d/c overnight d/t bradycardia. Afib --> Aflutter. Remains on heparin gtt. Cardiology [facility]) saw patient - "If remains symptomatic with shortness of breath and palpitations can consider TEE/DCCV"  01/16: remains significantly SOB, titrating rate control meds. 01/17: Heart rate is well-controlled.  Remains in atrial flutter.  Plan for DC cardioversion as an outpatient after acute illness.  Assessment & Plan:   Principal Problem:   RSV infection Active Problems:   Acute on chronic respiratory failure with hypoxia (HCC)   COPD exacerbation (HCC)   Atrial fibrillation with RVR (HCC)   Coronary artery disease   Myocardial injury   Hypertension   Hyperlipidemia   BPH (benign prostatic hyperplasia)   Anxiety   Atrial flutter (HCC)  Acute on chronic hypoxic respiratory failure, in the setting of RSV infection: RSV induced COPD exacerbation: Patient was found to be hypoxic requiring BiPAP on arrival.   Now weaned down to [place] Continue nebulized bronchodilators Continue Solu-Medrol 40 mg IV bid Continue Mucinex for cough  Incentive spirometry. F/u :sputum culture Continue Supplemental O2 - he is usually on 2-4L/min at home  Reports he is about at baseline, still significant SOB, consider ambulatory walk test tomorrow    New onset atrial fibrillation with RVR: Heart rate is now well-controlled. CHADS2 score is 4.  Started on heparin infusion. Patient had 2D echo 08/06/2022 which showed EF of 65 to 70% Cardizem drip discontinued.  Continue Cardizem 180 mg daily Continue IV metoprolol 5 mg every 2 hours for heart rate> 125 IV heparin transitioned with Eliquis. Plan for cardioversion as outpatient after acute illness resolves.   Mild volume overload: Resolved with 1 dose of IV Lasix.   Coronary artery disease.  ACS ruled out. Trend troponin --> flat  Continue Crestor D/c Plavix now that on Eliquis Discontinue aspirin.  Continue as needed nitroglycerin.   Hypertension: Continue IV hydralazine as needed. Hold Diovan since patient was started on diltiazem drip and metoprolol, now is on po diltiazem for rate control    Hyperlipidemia Continue Crestor.   BPH (benign prostatic hyperplasia) Continue Proscar and Flomax.   Anxiety Continue As needed Klonopin.   DVT prophylaxis: Heparin gtt Code Status:Full code. Family Communication: No family at bed side. Disposition Plan:   Status is: Inpatient Remains inpatient appropriate because: Admitted for new onset A-fib requiring Cardizem infusion.  Cardiology consulted    Consultants:  Cardiology  Procedures:  Antimicrobials:  Anti-infectives (From admission, onward)    None  Subjective: Patient seen and examined at bedside.  Overnight events noted. Patient reports doing better denies any chest pain.  He tried to get out of bed and became severely short of breath.  Objective: Vitals:   08/15/22 0747 08/15/22 0759  08/15/22 1144 08/15/22 1312  BP:  (!) 141/76 (!) 150/66   Pulse:  89 95   Resp:  20 18   Temp:      TempSrc:      SpO2: 97% 100% 100% 98%  Weight:        Intake/Output Summary (Last 24 hours) at 08/15/2022 1555 Last data filed at 08/15/2022 0855 Gross per 24 hour  Intake 45.44 ml  Output 1100 ml  Net -1054.56 ml   Filed Weights   08/12/22 1432  Weight: 72.6 kg    Examination:  General exam: Appears comfortable, not in any acute distress.  Deconditioned. Respiratory system: Wheezing bilaterally, respiratory effort normal, RR 16. Cardiovascular system: S1 & S2 heard, irregular rhythm, no murmur. Gastrointestinal system: Abdomen is soft, non tender, non distended, BS+ Central nervous system: Alert and oriented x 3. No focal neurological deficits. Extremities: No edema, No cyanosis, No clubbing. Skin: No rashes, lesions or ulcers Psychiatry: Judgement and insight appear normal. Mood & affect appropriate.     Data Reviewed: I have personally reviewed following labs and imaging studies  CBC: Recent Labs  Lab 08/12/22 1224 08/13/22 0303 08/14/22 0501  WBC 13.6* 8.1 11.7*  NEUTROABS 10.6*  --   --   HGB 12.5* 11.0* 10.9*  HCT 39.7 34.9* 33.4*  MCV 98.8 98.6 95.7  PLT 317 232 295   Basic Metabolic Panel: Recent Labs  Lab 08/12/22 1224 08/13/22 0303 08/14/22 0501 08/15/22 0514  NA 140 137 136 135  K 3.8 4.7 4.4 4.4  CL 101 102 100 99  CO2 '31 27 28 29  '$ GLUCOSE 100* 169* 156* 144*  BUN 25* 27* 35* 41*  CREATININE 1.21 1.14 1.11 1.04  CALCIUM 8.7* 8.1* 8.3* 8.3*  MG 2.0  --   --   --    GFR: Estimated Creatinine Clearance: 57.2 mL/min (by C-G formula based on SCr of 1.04 mg/dL). Liver Function Tests: Recent Labs  Lab 08/12/22 1224  AST 22  ALT 14  ALKPHOS 50  BILITOT 0.9  PROT 6.4*  ALBUMIN 3.6   No results for input(s): "LIPASE", "AMYLASE" in the last 168 hours. No results for input(s): "AMMONIA" in the last 168 hours. Coagulation Profile: Recent  Labs  Lab 08/12/22 1606  INR 1.1   Cardiac Enzymes: No results for input(s): "CKTOTAL", "CKMB", "CKMBINDEX", "TROPONINI" in the last 168 hours. BNP (last 3 results) Recent Labs    05/30/22 1106  PROBNP 34.0   HbA1C: Recent Labs    08/13/22 0129  HGBA1C 5.0   CBG: No results for input(s): "GLUCAP" in the last 168 hours. Lipid Profile: Recent Labs    08/13/22 0303  CHOL 141  HDL 80  LDLCALC 52  TRIG 46  CHOLHDL 1.8   Thyroid Function Tests: No results for input(s): "TSH", "T4TOTAL", "FREET4", "T3FREE", "THYROIDAB" in the last 72 hours. Anemia Panel: No results for input(s): "VITAMINB12", "FOLATE", "FERRITIN", "TIBC", "IRON", "RETICCTPCT" in the last 72 hours. Sepsis Labs: Recent Labs  Lab 08/12/22 1227 08/12/22 1438  LATICACIDVEN 1.7 1.4    Recent Results (from the past 240 hour(s))  Resp panel by RT-PCR (RSV, Flu A&B, Covid) Anterior Nasal Swab     Status: Abnormal   Collection Time: 08/12/22 12:27 PM  Specimen: Anterior Nasal Swab  Result Value Ref Range Status   SARS Coronavirus 2 by RT PCR NEGATIVE NEGATIVE Final    Comment: (NOTE) SARS-CoV-2 target nucleic acids are NOT DETECTED.  The SARS-CoV-2 RNA is generally detectable in upper respiratory specimens during the acute phase of infection. The lowest concentration of SARS-CoV-2 viral copies this assay can detect is 138 copies/mL. A negative result does not preclude SARS-Cov-2 infection and should not be used as the sole basis for treatment or other patient management decisions. A negative result may occur with  improper specimen collection/handling, submission of specimen other than nasopharyngeal swab, presence of viral mutation(s) within the areas targeted by this assay, and inadequate number of viral copies(<138 copies/mL). A negative result must be combined with clinical observations, patient history, and epidemiological information. The expected result is Negative.  Fact Sheet for Patients:   EntrepreneurPulse.com.au  Fact Sheet for Healthcare Providers:  IncredibleEmployment.be  This test is no t yet approved or cleared by the [place] FDA and  has been authorized for detection and/or diagnosis of SARS-CoV-2 by FDA under an Emergency Use Authorization (EUA). This EUA will remain  in effect (meaning this test can be used) for the duration of the COVID-19 declaration under Section 564(b)(1) of the Act, 21 U.S.C.section 360bbb-3(b)(1), unless the authorization is terminated  or revoked sooner.       Influenza A by PCR NEGATIVE NEGATIVE Final   Influenza B by PCR NEGATIVE NEGATIVE Final    Comment: (NOTE) The Xpert Xpress SARS-CoV-2/FLU/RSV plus assay is intended as an aid in the diagnosis of influenza from Nasopharyngeal swab specimens and should not be used as a sole basis for treatment. Nasal washings and aspirates are unacceptable for Xpert Xpress SARS-CoV-2/FLU/RSV testing.  Fact Sheet for Patients: EntrepreneurPulse.com.au  Fact Sheet for Healthcare Providers: IncredibleEmployment.be  This test is not yet approved or cleared by the [place] FDA and has been authorized for detection and/or diagnosis of SARS-CoV-2 by FDA under an Emergency Use Authorization (EUA). This EUA will remain in effect (meaning this test can be used) for the duration of the COVID-19 declaration under Section 564(b)(1) of the Act, 21 U.S.C. section 360bbb-3(b)(1), unless the authorization is terminated or revoked.     Resp Syncytial Virus by PCR POSITIVE (A) NEGATIVE Final    Comment: (NOTE) Fact Sheet for Patients: EntrepreneurPulse.com.au  Fact Sheet for Healthcare Providers: IncredibleEmployment.be  This test is not yet approved or cleared by the [place] FDA and has been authorized for detection and/or diagnosis of SARS-CoV-2 by FDA under an Emergency Use  Authorization (EUA). This EUA will remain in effect (meaning this test can be used) for the duration of the COVID-19 declaration under Section 564(b)(1) of the Act, 21 U.S.C. section 360bbb-3(b)(1), unless the authorization is terminated or revoked.  Performed at [facility], [address] [place], [place] [address]   Expectorated Sputum Assessment w Gram Stain, Rflx to Resp Cult     Status: None   Collection Time: 08/12/22  4:39 PM   Specimen: Sputum  Result Value Ref Range Status   Specimen Description SPUTUM  Final   Special Requests NONE  Final   Sputum evaluation   Final    Sputum specimen not acceptable for testing.  Please recollect.   NOTIFIED ASHLEY TREXLER ON 08/12/22 AT 1735 QSD Performed at [facility], [address], [place], [place] [address]    Report Status 08/12/2022 FINAL  Final  Expectorated Sputum Assessment w Gram Stain, Rflx  to Resp Cult     Status: None   Collection Time: 08/13/22 10:52 AM   Specimen: Sputum  Result Value Ref Range Status   Specimen Description SPU  Final   Special Requests SPU  Final   Sputum evaluation   Final    THIS SPECIMEN IS ACCEPTABLE FOR SPUTUM CULTURE Performed at [facility], [address] [place], [place] [address]    Report Status 08/13/2022 FINAL  Final  Culture, Respiratory w Gram Stain     Status: None (Preliminary result)   Collection Time: 08/13/22 10:52 AM   Specimen: Sputum  Result Value Ref Range Status   Specimen Description   Final    SPU Performed at [facility], [address] [place], [place] [address]    Special Requests   Final    SPU Reflexed from [id number] Performed at [facility], [place], [place] [address]    Gram Stain   Final    RARE WBC PRESENT, PREDOMINANTLY PMN MODERATE GRAM POSITIVE COCCI IN CHAINS FEW GRAM NEGATIVE RODS FEW YEAST PREVIOUSLY REPORTED AS: MODERATE GRAM NEGATIVE RODS MODERATE WBC PRESENT, PREDOMINANTLY  PMN CORRECTED RESULTS CALLED TO:  C/  A. DAVIS, RN 08/13/22 2023 A. LAFRANCE    Culture   Final    CULTURE REINCUBATED FOR BETTER GROWTH Performed at [facility], [place] [address], [place], [place] [address]    Report Status PENDING  Incomplete    Radiology Studies: No results found.  Scheduled Meds:  apixaban  5 mg Oral BID   diltiazem  180 mg Oral Daily   finasteride  5 mg Oral Daily   gabapentin  100 mg Oral BID   ipratropium  0.5 mg Nebulization Q6H   levalbuterol  0.63 mg Nebulization Q6H   melatonin  5 mg Oral QHS   methylPREDNISolone (SOLU-MEDROL) injection  80 mg Intravenous Q24H   pantoprazole  40 mg Oral Daily   polyethylene glycol  17 g Oral Daily   rosuvastatin  20 mg Oral Daily   tamsulosin  0.4 mg Oral Daily   Continuous Infusions:   LOS: 3 days    Time spent: 50 mins    Zakk Borgen, MD [facility]   If 7PM-7AM, please contact night-coverage

## 2022-08-16 DIAGNOSIS — J441 Chronic obstructive pulmonary disease with (acute) exacerbation: Secondary | ICD-10-CM

## 2022-08-16 DIAGNOSIS — I25118 Atherosclerotic heart disease of native coronary artery with other forms of angina pectoris: Secondary | ICD-10-CM

## 2022-08-16 DIAGNOSIS — J205 Acute bronchitis due to respiratory syncytial virus: Secondary | ICD-10-CM

## 2022-08-16 DIAGNOSIS — I4891 Unspecified atrial fibrillation: Secondary | ICD-10-CM | POA: Diagnosis not present

## 2022-08-16 DIAGNOSIS — J9621 Acute and chronic respiratory failure with hypoxia: Secondary | ICD-10-CM | POA: Diagnosis not present

## 2022-08-16 LAB — CBC
HCT: 33.4 % — ABNORMAL LOW (ref 39.0–52.0)
Hemoglobin: 11 g/dL — ABNORMAL LOW (ref 13.0–17.0)
MCH: 31.3 pg (ref 26.0–34.0)
MCHC: 32.9 g/dL (ref 30.0–36.0)
MCV: 94.9 fL (ref 80.0–100.0)
Platelets: 260 10*3/uL (ref 150–400)
RBC: 3.52 MIL/uL — ABNORMAL LOW (ref 4.22–5.81)
RDW: 14.6 % (ref 11.5–15.5)
WBC: 10.8 10*3/uL — ABNORMAL HIGH (ref 4.0–10.5)
nRBC: 0 % (ref 0.0–0.2)

## 2022-08-16 LAB — BASIC METABOLIC PANEL
Anion gap: 5 (ref 5–15)
BUN: 47 mg/dL — ABNORMAL HIGH (ref 8–23)
CO2: 32 mmol/L (ref 22–32)
Calcium: 8.4 mg/dL — ABNORMAL LOW (ref 8.9–10.3)
Chloride: 99 mmol/L (ref 98–111)
Creatinine, Ser: 1.1 mg/dL (ref 0.61–1.24)
GFR, Estimated: >60 mL/min (ref >60)
Glucose, Bld: 153 mg/dL — ABNORMAL HIGH (ref 70–99)
Potassium: 4.8 mmol/L (ref 3.5–5.1)
Sodium: 136 mmol/L (ref 135–145)

## 2022-08-16 LAB — CULTURE, RESPIRATORY W GRAM STAIN: Culture: NORMAL

## 2022-08-16 LAB — PHOSPHORUS: Phosphorus: 4.1 mg/dL (ref 2.5–4.6)

## 2022-08-16 LAB — MAGNESIUM: Magnesium: 2.4 mg/dL (ref 1.7–2.4)

## 2022-08-16 MED ORDER — PREDNISONE 20 MG PO TABS
40.0000 mg | ORAL_TABLET | Freq: Every day | ORAL | Status: DC
  Administered 2022-08-17 – 2022-08-19 (×3): 40 mg via ORAL
  Filled 2022-08-16 (×3): qty 2

## 2022-08-16 MED ORDER — DILTIAZEM HCL ER COATED BEADS 180 MG PO CP24
180.0000 mg | ORAL_CAPSULE | Freq: Two times a day (BID) | ORAL | Status: DC
  Administered 2022-08-16 – 2022-08-20 (×8): 180 mg via ORAL
  Filled 2022-08-16 (×8): qty 1

## 2022-08-16 NOTE — Progress Notes (Signed)
PROGRESS NOTE    [name] [name].  FAO:[id number] DOB: 1941-04-25 DOA: 08/12/2022  PCP: [name], MD   Brief Narrative:  This 82 y.o. 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  01/14: Patient was found to have severe respiratory distress and severe tachycardia with heart rates up to 180s.  Found to have new onset atrial fibrillation.  He cannot speak in full sentence, using accessory muscle for breathing, initially started on CPAP but still has respiratory distress.  BiPAP is started in the ED. (+)PCR for RSV, BNP 61, lactic acid normal 1.7, WBC 13.6, GFR> 60, blood pressure 130/59, RR 28.  ABG with pH 7.32, CO2 68, O2 43.  Chest x-ray showed COPD without infiltration.  Patient is admitted to stepdown as inpatient. Later in the afternoon was transitioned to [place] O2 2L/min 01/15: maintaining on [place] O2 2-4L. HR improved. Cardizem gtt d/c overnight d/t bradycardia. Afib --> Aflutter. Remains on heparin gtt. Cardiology [facility]) saw patient - "If remains symptomatic with shortness of breath and palpitations can consider TEE/DCCV"  01/16: remains significantly SOB, titrating rate control meds. 01/17: Heart rate is well-controlled.  Remains in atrial flutter.  Plan for DC cardioversion as an outpatient after acute illness.  Assessment & Plan:   Principal Problem:   RSV bronchitis Active Problems:   Acute on chronic respiratory failure with hypoxia (HCC)   COPD exacerbation (HCC)   Atrial fibrillation with RVR (HCC)   Coronary artery disease   Myocardial injury   Hypertension   Hyperlipidemia   BPH (benign prostatic hyperplasia)   Anxiety   Atrial flutter (HCC)  Acute on chronic hypoxic respiratory failure, in the setting of RSV infection: RSV induced COPD exacerbation: Patient was found to be hypoxic requiring BiPAP on  arrival.  Now weaned down to [place] Continue nebulized bronchodilators Continue Solu-Medrol 40 mg IV bid Continue Mucinex for cough  Incentive spirometry. F/u :sputum culture Continue Supplemental O2 - he is usually on 2-4L/min at home  Reports he is about at baseline, still significant SOB, still has hypoxia while ablation.   New onset atrial fibrillation with RVR: Heart rate is now well-controlled. CHADS2 score is 4.  Started on heparin infusion. Patient had 2D echo 08/06/2022 which showed EF of 65 to 70% Cardizem drip discontinued.  Continue Cardizem 180 mg daily Continue IV metoprolol 5 mg every 2 hours for heart rate> 125 IV heparin transitioned with Eliquis. Plan for cardioversion as outpatient after acute illness resolves.   Mild volume overload: Resolved with 1 dose of IV Lasix.   Coronary artery disease.  ACS ruled out. Trend troponin --> flat  Continue Crestor D/c Plavix now that on Eliquis Discontinue aspirin.  Continue as needed nitroglycerin.   Hypertension: Continue IV hydralazine as needed. Hold Diovan , Continue Cardizem   Hyperlipidemia Continue Crestor.   BPH (benign prostatic hyperplasia) Continue Proscar and Flomax.   Anxiety Continue As needed Klonopin.   DVT prophylaxis: Heparin gtt Code Status:Full code. Family Communication: No family at bed side. Disposition Plan:   Status is: Inpatient Remains inpatient appropriate because: Admitted for new onset A-fib requiring Cardizem infusion.  Cardiology consulted, medication adjusted.  Recommended cardioversion once acute event resolved.    Consultants:  Cardiology  Procedures:  Antimicrobials:  Anti-infectives (From admission, onward)    None      Subjective: Patient  seen and examined at bedside.  Overnight events noted. Patient reports doing better, denies any chest pain.  He still has shortness of breath while talking. He tried to get out of bed and became severely short of  breath.  Objective: Vitals:   08/16/22 0812 08/16/22 0914 08/16/22 1229 08/16/22 1328  BP: (!) 150/71  (!) 140/66   Pulse: 88 87 90 91  Resp: 18 (!) 22 20 (!) 22  Temp: 98.4 F (36.9 C)  98.3 F (36.8 C)   TempSrc: Oral  Oral   SpO2: 97% 96% 100% 98%  Weight:        Intake/Output Summary (Last 24 hours) at 08/16/2022 1606 Last data filed at 08/16/2022 1449 Gross per 24 hour  Intake --  Output 900 ml  Net -900 ml   Filed Weights   08/12/22 1432  Weight: 72.6 kg    Examination:  General exam: Appears comfortable, not in any acute distress.  Deconditioned. Respiratory system: CTA bilaterally, respiratory effort normal, RR 14. Cardiovascular system: S1 & S2 heard, irregular rhythm, no murmur. Gastrointestinal system: Abdomen is soft, non tender, non distended, BS+ Central nervous system: Alert and oriented x 3. No focal neurological deficits. Extremities: No edema, No cyanosis, No clubbing. Skin: No rashes, lesions or ulcers Psychiatry: Judgement and insight appear normal. Mood & affect appropriate.     Data Reviewed: I have personally reviewed following labs and imaging studies  CBC: Recent Labs  Lab 08/12/22 1224 08/13/22 0303 08/14/22 0501 08/16/22 0146  WBC 13.6* 8.1 11.7* 10.8*  NEUTROABS 10.6*  --   --   --   HGB 12.5* 11.0* 10.9* 11.0*  HCT 39.7 34.9* 33.4* 33.4*  MCV 98.8 98.6 95.7 94.9  PLT 317 232 255 376   Basic Metabolic Panel: Recent Labs  Lab 08/12/22 1224 08/13/22 0303 08/14/22 0501 08/15/22 0514 08/16/22 0146  NA 140 137 136 135 136  K 3.8 4.7 4.4 4.4 4.8  CL 101 102 100 99 99  CO2 '31 27 28 29 '$ 32  GLUCOSE 100* 169* 156* 144* 153*  BUN 25* 27* 35* 41* 47*  CREATININE 1.21 1.14 1.11 1.04 1.10  CALCIUM 8.7* 8.1* 8.3* 8.3* 8.4*  MG 2.0  --   --   --  2.4  PHOS  --   --   --   --  4.1   GFR: Estimated Creatinine Clearance: 54.1 mL/min (by C-G formula based on SCr of 1.1 mg/dL). Liver Function Tests: Recent Labs  Lab 08/12/22 1224   AST 22  ALT 14  ALKPHOS 50  BILITOT 0.9  PROT 6.4*  ALBUMIN 3.6   No results for input(s): "LIPASE", "AMYLASE" in the last 168 hours. No results for input(s): "AMMONIA" in the last 168 hours. Coagulation Profile: Recent Labs  Lab 08/12/22 1606  INR 1.1   Cardiac Enzymes: No results for input(s): "CKTOTAL", "CKMB", "CKMBINDEX", "TROPONINI" in the last 168 hours. BNP (last 3 results) Recent Labs    05/30/22 1106  PROBNP 34.0   HbA1C: No results for input(s): "HGBA1C" in the last 72 hours.  CBG: No results for input(s): "GLUCAP" in the last 168 hours. Lipid Profile: No results for input(s): "CHOL", "HDL", "LDLCALC", "TRIG", "CHOLHDL", "LDLDIRECT" in the last 72 hours.  Thyroid Function Tests: No results for input(s): "TSH", "T4TOTAL", "FREET4", "T3FREE", "THYROIDAB" in the last 72 hours. Anemia Panel: No results for input(s): "VITAMINB12", "FOLATE", "FERRITIN", "TIBC", "IRON", "RETICCTPCT" in the last 72 hours. Sepsis Labs: Recent Labs  Lab 08/12/22 1227 08/12/22 1438  LATICACIDVEN 1.7 1.4    Recent Results (from the past 240 hour(s))  Resp panel by RT-PCR (RSV, Flu A&B, Covid) Anterior Nasal Swab     Status: Abnormal   Collection Time: 08/12/22 12:27 PM   Specimen: Anterior Nasal Swab  Result Value Ref Range Status   SARS Coronavirus 2 by RT PCR NEGATIVE NEGATIVE Final    Comment: (NOTE) SARS-CoV-2 target nucleic acids are NOT DETECTED.  The SARS-CoV-2 RNA is generally detectable in upper respiratory specimens during the acute phase of infection. The lowest concentration of SARS-CoV-2 viral copies this assay can detect is 138 copies/mL. A negative result does not preclude SARS-Cov-2 infection and should not be used as the sole basis for treatment or other patient management decisions. A negative result may occur with  improper specimen collection/handling, submission of specimen other than nasopharyngeal swab, presence of viral mutation(s) within the areas  targeted by this assay, and inadequate number of viral copies(<138 copies/mL). A negative result must be combined with clinical observations, patient history, and epidemiological information. The expected result is Negative.  Fact Sheet for Patients:  EntrepreneurPulse.com.au  Fact Sheet for Healthcare Providers:  IncredibleEmployment.be  This test is no t yet approved or cleared by the [place] FDA and  has been authorized for detection and/or diagnosis of SARS-CoV-2 by FDA under an Emergency Use Authorization (EUA). This EUA will remain  in effect (meaning this test can be used) for the duration of the COVID-19 declaration under Section 564(b)(1) of the Act, 21 U.S.C.section 360bbb-3(b)(1), unless the authorization is terminated  or revoked sooner.       Influenza A by PCR NEGATIVE NEGATIVE Final   Influenza B by PCR NEGATIVE NEGATIVE Final    Comment: (NOTE) The Xpert Xpress SARS-CoV-2/FLU/RSV plus assay is intended as an aid in the diagnosis of influenza from Nasopharyngeal swab specimens and should not be used as a sole basis for treatment. Nasal washings and aspirates are unacceptable for Xpert Xpress SARS-CoV-2/FLU/RSV testing.  Fact Sheet for Patients: EntrepreneurPulse.com.au  Fact Sheet for Healthcare Providers: IncredibleEmployment.be  This test is not yet approved or cleared by the [place] FDA and has been authorized for detection and/or diagnosis of SARS-CoV-2 by FDA under an Emergency Use Authorization (EUA). This EUA will remain in effect (meaning this test can be used) for the duration of the COVID-19 declaration under Section 564(b)(1) of the Act, 21 U.S.C. section 360bbb-3(b)(1), unless the authorization is terminated or revoked.     Resp Syncytial Virus by PCR POSITIVE (A) NEGATIVE Final    Comment: (NOTE) Fact Sheet for  Patients: EntrepreneurPulse.com.au  Fact Sheet for Healthcare Providers: IncredibleEmployment.be  This test is not yet approved or cleared by the [place] FDA and has been authorized for detection and/or diagnosis of SARS-CoV-2 by FDA under an Emergency Use Authorization (EUA). This EUA will remain in effect (meaning this test can be used) for the duration of the COVID-19 declaration under Section 564(b)(1) of the Act, 21 U.S.C. section 360bbb-3(b)(1), unless the authorization is terminated or revoked.  Performed at [facility], [address] [place], [place] [address]   Expectorated Sputum Assessment w Gram Stain, Rflx to Resp Cult     Status: None   Collection Time: 08/12/22  4:39 PM   Specimen: Sputum  Result Value Ref Range Status   Specimen Description SPUTUM  Final   Special Requests NONE  Final   Sputum evaluation   Final    Sputum specimen not acceptable for testing.  Please  recollect.   NOTIFIED ASHLEY TREXLER ON 08/12/22 AT 1735 QSD Performed at [facility], [address] [place], [place] [address]    Report Status 08/12/2022 FINAL  Final  Expectorated Sputum Assessment w Gram Stain, Rflx to Resp Cult     Status: None   Collection Time: 08/13/22 10:52 AM   Specimen: Sputum  Result Value Ref Range Status   Specimen Description SPU  Final   Special Requests SPU  Final   Sputum evaluation   Final    THIS SPECIMEN IS ACCEPTABLE FOR SPUTUM CULTURE Performed at [facility], [address] [place], [place] [address]    Report Status 08/13/2022 FINAL  Final  Culture, Respiratory w Gram Stain     Status: None   Collection Time: 08/13/22 10:52 AM   Specimen: Sputum  Result Value Ref Range Status   Specimen Description   Final    SPU Performed at [facility] [address], [place], [place] [address]    Special Requests   Final    SPU Reflexed from [id number] Performed at [facility], [address] [place], [place] [address]    Gram Stain   Final    RARE WBC PRESENT, PREDOMINANTLY PMN MODERATE GRAM POSITIVE COCCI IN CHAINS FEW GRAM NEGATIVE RODS FEW YEAST PREVIOUSLY REPORTED AS: MODERATE GRAM NEGATIVE RODS MODERATE WBC PRESENT, PREDOMINANTLY PMN CORRECTED RESULTS CALLED TO:  C/  A. DAVIS, RN 08/13/22 2023 A. LAFRANCE    Culture   Final    Normal respiratory flora-no Staph aureus or Pseudomonas seen Performed at [facility] [address], [place], [place] [address]    Report Status 08/16/2022 FINAL  Final    Radiology Studies: No results found.  Scheduled Meds:  apixaban  5 mg Oral BID   diltiazem  180 mg Oral BID   finasteride  5 mg Oral Daily   gabapentin  100 mg Oral BID   ipratropium  0.5 mg Nebulization Q6H   levalbuterol  0.63 mg Nebulization Q6H   melatonin  5 mg Oral QHS   pantoprazole  40 mg Oral Daily   polyethylene glycol  17 g Oral Daily   [START ON 08/17/2022] predniSONE  40 mg Oral Q breakfast   rosuvastatin  20 mg Oral Daily   tamsulosin  0.4 mg Oral Daily   Continuous Infusions:   LOS: 4 days    Time spent: 35 mins    Dyesha Henault, MD [facility]   If 7PM-7AM, please contact night-coverage

## 2022-08-16 NOTE — NC FL2 (Signed)
[place] LEVEL OF CARE FORM     IDENTIFICATION  Patient Name: [name] [name]. Birthdate: 12-30-1940 Sex: male Admission Date (Current Location): 08/12/2022  [facility] and [place] Number:  Engineering geologist and Address:  [facility], [address] [place], [place] [address]      Provider Number: [id number]  Attending Physician Name and Address:  Shawna Clamp, MD  Relative Name and Phone Number:  Manuela Schwartz  [phone]    Current Level of Care: Hospital Recommended Level of Care: [place] Prior Approval Number:    Date Approved/Denied:   PASRR Number: [id number]  Discharge Plan: SNF    Current Diagnoses: Patient Active Problem List   Diagnosis Date Noted   Atrial flutter ([place]) 08/15/2022   RSV bronchitis 08/12/2022   Acute on chronic respiratory failure with hypoxia ([place]) 08/12/2022   COPD exacerbation ([place]) 08/12/2022   Atrial fibrillation with RVR ([place]) 08/12/2022   Myocardial injury 08/12/2022   Anxiety 08/12/2022   BPH (benign prostatic hyperplasia) 08/12/2022   Current chronic use of systemic steroids 07/05/2022   Chronic respiratory failure with hypoxia and hypercapnia ([place]) 05/31/2022   Degenerative disc disease, 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   Degenerative disc disease, 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   Annual physical exam 12/07/2020   Chronic sinusitis 12/07/2020   Double vision with both eyes open 12/07/2020   Left knee pain 12/07/2020   Hypoxemia 12/07/2020   Osteoarthritis of knee 12/07/2020   Benign prostatic hyperplasia with lower urinary tract symptoms 12/07/2020   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   Renal cyst 12/07/2020   Chronic obstructive pulmonary disease with (acute) exacerbation ([place]) 12/07/2020   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[place]) 12/07/2020   Contracture of palmar fascia 12/07/2020   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[place]) 12/07/2020   Alcohol dependence ([place]) 12/07/2020   Alcohol abuse    Prostate  cancer ([place])    Coronary artery disease    GERD (gastroesophageal reflux disease)    Hyperlipidemia    Hypertension    Kidney cysts    Pneumonia    Status post bronchoscopy 04/12/2020   Pneumothorax 04/12/2020   Acquired trigger finger of right middle finger 02/04/2020   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   Chronic respiratory failure with hypoxia ([place]) 08/10/2019   Coagulation disorder ([place]) 01/13/2019   Dysphagia 08/14/2018   History of acute otitis externa 08/14/2018   Coronary atherosclerosis 05/07/2018   Corns and callosities 04/09/2018   Diarrhea 04/09/2018   Enlarged prostate without lower urinary tract symptoms (luts) 04/09/2018   Male erectile dysfunction 04/09/2018   Cigarette nicotine dependence, uncomplicated 62/83/6629   Nocturia more than twice per night 04/09/2018   Pain in joint 04/09/2018   Pain in knee 04/09/2018   Pain of finger 04/09/2018   Polypharmacy 47/65/4650   Uncomplicated alcohol dependence ([place]) 04/09/2018   Ex-smoker 04/09/2018   Vitamin D deficiency 04/08/2018   Osteoarthritis of first carpometacarpal joint 04/08/2018   Gastro-esophageal reflux disease without esophagitis 04/08/2018   Mixed hyperlipidemia 04/08/2018   Flatulence 04/08/2018   Anal or rectal pain 04/08/2018   Elevated prostate specific antigen (PSA) 04/08/2018   Atherosclerotic heart disease of native coronary artery without angina pectoris 04/08/2018   Essential (primary) hypertension 04/08/2018   Sleep disorder 04/08/2018   Low back pain 04/08/2018   DOE (dyspnea on exertion) 04/02/2018   Pulmonary nodules 06/08/2016   COPD (chronic obstructive pulmonary disease) ([place]) 06/08/2016    Orientation RESPIRATION BLADDER Height & Weight     Self, Time, Situation, Place  O2 (3L nasal cannula) Incontinent, External catheter Weight: 160 lb 0.9 oz (72.6 kg) (recorded 06/2022) Height:     BEHAVIORAL SYMPTOMS/MOOD NEUROLOGICAL BOWEL NUTRITION STATUS      Continent Diet (see  discharge summary)  AMBULATORY STATUS COMMUNICATION OF NEEDS Skin   Limited Assist Verbally Other (Comment) (blister left toe, abrasion left knee)                       Personal Care Assistance Level of Assistance  Bathing, Dressing, Feeding, Total care Bathing Assistance: Limited assistance Feeding assistance: Independent Dressing Assistance: Limited assistance Total Care Assistance: Limited assistance   Functional Limitations Info  Sight, Hearing, Speech Sight Info: Impaired Hearing Info: Adequate Speech Info: Adequate    SPECIAL CARE FACTORS FREQUENCY  PT (By licensed PT), OT (By licensed OT)     PT Frequency: min 4x weekly OT Frequency: min 4x weekly            Contractures Contractures Info: Not present    Additional Factors Info  Code Status, Allergies Code Status Info: full Allergies Info: flagyl (metronidazole)           Current Medications (08/16/2022):  This is the current hospital active medication list Current Facility-Administered Medications  Medication Dose Route Frequency Provider Last Rate Last Admin   acetaminophen (TYLENOL) tablet 650 mg  650 mg Oral Q6H PRN Ivor Costa, MD   650 mg at 08/15/22 2227   albuterol (PROVENTIL) (2.5 MG/3ML) 0.083% nebulizer solution 2.5 mg  2.5 mg Nebulization Q4H PRN Ivor Costa, MD   2.5 mg at 08/13/22 1047   apixaban (ELIQUIS) tablet 5 mg  5 mg Oral BID Emeterio Reeve, DO   5 mg at 08/16/22 5573   clonazePAM (KLONOPIN) tablet 0.5 mg  0.5 mg Oral Daily PRN Ivor Costa, MD       dextromethorphan-guaiFENesin [facility] DM) 30-600 MG per 12 hr tablet 1 tablet  1 tablet Oral BID PRN Ivor Costa, MD       diltiazem (CARDIZEM CD) 24 hr capsule 180 mg  180 mg Oral BID Dunn, Ryan M, PA-C       finasteride (PROSCAR) tablet 5 mg  5 mg Oral Daily Ivor Costa, MD   5 mg at 08/16/22 0955   gabapentin (NEURONTIN) capsule 100 mg  100 mg Oral BID Ivor Costa, MD   100 mg at 08/16/22 0955   ipratropium (ATROVENT) nebulizer solution  0.5 mg  0.5 mg Nebulization Q6H Ivor Costa, MD   0.5 mg at 08/16/22 1327   levalbuterol (XOPENEX) nebulizer solution 0.63 mg  0.63 mg Nebulization Q6H Ivor Costa, MD   0.63 mg at 08/16/22 1327   melatonin tablet 5 mg  5 mg Oral QHS Ivor Costa, MD   5 mg at 08/15/22 2216   nitroGLYCERIN (NITROSTAT) SL tablet 0.4 mg  0.4 mg Sublingual Q5 min PRN Ivor Costa, MD       ondansetron [facility]) injection 4 mg  4 mg Intravenous Q8H PRN Ivor Costa, MD       pantoprazole (PROTONIX) EC tablet 40 mg  40 mg Oral Daily Ivor Costa, MD   40 mg at 08/16/22 0956   polyethylene glycol (MIRALAX / GLYCOLAX) packet 17 g  17 g Oral Daily Shawna Clamp, MD   17 g at 08/15/22 0957   [START ON 08/17/2022] predniSONE (DELTASONE) tablet 40 mg  40 mg Oral Q breakfast Shawna Clamp, MD       rosuvastatin (CRESTOR) tablet 20 mg  20  mg Oral Daily Ivor Costa, MD   20 mg at 08/16/22 0957   tamsulosin (FLOMAX) capsule 0.4 mg  0.4 mg Oral Daily Ivor Costa, MD   0.4 mg at 08/16/22 4784     Discharge Medications: Please see discharge summary for a list of discharge medications.  Relevant Imaging Results:  Relevant Lab Results:   Additional Information SSN: [id number]  Tiburcio Bash, LCSW

## 2022-08-16 NOTE — Evaluation (Signed)
Physical Therapy Evaluation Patient Details Name: [name] [name]. MRN: [id number] DOB: 1940/11/01 Today's Date: 08/16/2022  History of Present Illness  Pt is an 82 y.o. male presenting to hospital 08/12/22 with worsening SOB and productive cough last several days; pt noted with elevated HR.  (+) RSV.  Pt admited with acute on chronic respiratory failure with hypoxia and RSV infection; RSV induced COPD exacerbation; and a-fib with RVR (new onset).  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  Clinical Impression  Prior to hospital admission, pt was independent with ambulation within home; uses transport chair or electric scooter in community; chronic home O2 use (0-3L at rest; 3-4.5 L with activity; 3 L at night); lives with his wife in [place] at [facility].  Currently pt is modified independent semi-supine to sitting edge of bed; CGA with transfers using RW; and CGA to ambulate 20 feet with RW use.  Limited distance ambulating (and pt requiring a few standing rest breaks) d/t significant increased SOB and work of breathing during ambulation (pt also appearing to become very anxious with all activity during session).  O2 sats 98% on 3 L O2 at rest beginning of session; O2 sats 92% or greater on 4 L O2 with activity; and O2 sats 96% on 3.5 L O2 at rest end of session (nurse notified).  Pt would benefit from skilled PT to address noted impairments and functional limitations (see below for any additional details).  D/t pt's impaired activity tolerance, upon hospital discharge, pt would benefit from SNF.      Recommendations for follow up therapy are one component of a multi-disciplinary discharge planning process, led by the attending physician.  Recommendations may be updated based on patient status, additional functional criteria and insurance authorization.  Follow Up Recommendations Skilled nursing-short term rehab (<3 hours/day) Can patient  physically be transported by private vehicle: No    Assistance Recommended at Discharge Frequent or constant Supervision/Assistance  Patient can return home with the following  A little help with walking and/or transfers;A little help with bathing/dressing/bathroom;Assistance with cooking/housework;Assist for transportation;Help with stairs or ramp for entrance    Equipment Recommendations Rolling walker (2 wheels);Wheelchair (measurements PT);Wheelchair cushion (measurements PT);BSC/3in1  Recommendations for Other Services       Functional Status Assessment Patient has had a recent decline in their functional status and demonstrates the ability to make significant improvements in function in a reasonable and predictable amount of time.     Precautions / Restrictions Precautions Precautions: Fall Restrictions Weight Bearing Restrictions: No      Mobility  Bed Mobility Overal bed mobility: Modified Independent             General bed mobility comments: Mild increased effort to perform on own; HOB elevated    Transfers Overall transfer level: Needs assistance Equipment used: Rolling walker (2 wheels) Transfers: Sit to/from Stand Sit to Stand: Min guard           General transfer comment: vc's for UE placement; increased effort/time to stand up to RW    Ambulation/Gait Ambulation/Gait assistance: Min guard Gait Distance (Feet): 20 Feet Assistive device: Rolling walker (2 wheels) Gait Pattern/deviations: Decreased step length - right, Decreased step length - left Gait velocity: decreased     General Gait Details: pt requiring a few standing rest breaks d/t SOB/increased WOB; overall steady with RW use  Science writer  Modified Rankin (Stroke Patients Only)       Balance Overall balance assessment: Needs assistance Sitting-balance support: No upper extremity supported, Feet supported Sitting balance-[name]:  Good Sitting balance - Comments: steady sitting reaching within BOS   Standing balance support: Bilateral upper extremity supported, During functional activity, Reliant on assistive device for balance Standing balance-[name]: Good Standing balance comment: steady ambulating with RW use                             Pertinent Vitals/Pain Pain Assessment Pain Assessment: No/denies pain HR WFL during sessions activities.    Home Living Family/patient expects to be discharged to:: Private residence ([place]) Living Arrangements: Spouse/significant other Available Help at Discharge: Family;Available PRN/intermittently Type of Home: House Home Access: Level entry       Home Layout: One level Home Equipment: Rollator (4 wheels);Shower seat;Transport chair;Electric scooter Additional Comments: home O2    Prior Function Prior Level of Function : Independent/Modified Independent             Mobility Comments: Independent ambulating without AD use in home; uses transport chair or electric scooter in community.  3 L home O2 at night; 0-3 L O2 at rest during day; 3-4.5 L O2 with activity during day.  No recent falls reported. ADLs Comments: (+) driving     Hand Dominance        Extremity/Trunk Assessment   Upper Extremity Assessment Upper Extremity Assessment: Overall WFL for tasks assessed    Lower Extremity Assessment Lower Extremity Assessment: Generalized weakness    Cervical / Trunk Assessment Cervical / Trunk Assessment: Normal  Communication   Communication: No difficulties  Cognition Arousal/Alertness: Awake/alert Behavior During Therapy: Anxious Overall Cognitive Status: Within Functional Limits for tasks assessed                                          General Comments  Nursing cleared pt for participation in physical therapy.  Pt agreeable to PT session.    Exercises     Assessment/Plan    PT  Assessment Patient needs continued PT services  PT Problem List Decreased strength;Decreased activity tolerance;Decreased balance;Decreased mobility;Decreased knowledge of use of DME;Decreased knowledge of precautions;Cardiopulmonary status limiting activity       PT Treatment Interventions DME instruction;Gait training;Functional mobility training;Therapeutic activities;Therapeutic exercise;Balance training;Patient/family education    PT Goals (Current goals can be found in the Care Plan section)  Acute Rehab PT Goals Patient Stated Goal: improve breathing PT Goal Formulation: With patient Time For Goal Achievement: 08/30/22 Potential to Achieve Goals: Fair    Frequency Min 2X/week     Co-evaluation               AM-PAC PT "6 Clicks" Mobility  Outcome Measure Help needed turning from your back to your side while in a flat bed without using bedrails?: None Help needed moving from lying on your back to sitting on the side of a flat bed without using bedrails?: None Help needed moving to and from a bed to a chair (including a wheelchair)?: A Little Help needed standing up from a chair using your arms (e.g., wheelchair or bedside chair)?: A Little Help needed to walk in hospital room?: A Little Help needed climbing 3-5 steps with a railing? : A Lot 6 Click Score: 19  End of Session Equipment Utilized During Treatment: Gait belt;Oxygen Activity Tolerance: Other (comment) (Limited d/t SOB/increased WOB) Patient left: in chair;with call bell/phone within reach;with chair alarm set Nurse Communication: Mobility status;Precautions;Other (comment) (pt's vitals, SOB/increased WOB, and pt on 3.5 L O2 end of session) PT Visit Diagnosis: Other abnormalities of gait and mobility (R26.89);Muscle weakness (generalized) (M62.81)    Time: 3474-2595 PT Time Calculation (min) (ACUTE ONLY): 28 min   Charges:   PT Evaluation $PT Eval Low Complexity: 1 Low PT Treatments $Therapeutic  Activity: 8-22 mins       Leitha Bleak, PT 08/16/22, 3:08 PM

## 2022-08-16 NOTE — TOC Initial Note (Signed)
Transition of Care (TOC) - Initial/Assessment Note    Patient Details  Name: [name] [name]. MRN: [id number] Date of Birth: 12-02-1940  Transition of Care [facility]) CM/SW Contact:    Tiburcio Bash, LCSW Phone Number: 08/16/2022, 3:36 PM  Clinical Narrative:                  CSW notes patient is agreeable to SNF at [facility] where he has been living at [place]. Per md likely ready tomorrow. CSW has updated Seth Bake at [facility] and started insurance authorization.   Expected Discharge Plan: Skilled Nursing Facility Barriers to Discharge: Continued Medical Work up   Patient Goals and CMS Choice Patient states their goals for this hospitalization and ongoing recovery are:: to go home CMS Medicare.gov Compare Post Acute Care list provided to:: Patient Choice offered to / list presented to : Patient      Expected Discharge Plan and Services       Living arrangements for the past 2 months: [place]                                      Prior Living Arrangements/Services Living arrangements for the past 2 months: [place] Lives with:: Self                   Activities of Daily Living Home Assistive Devices/Equipment: Environmental consultant (specify type), Eyeglasses ADL Screening (condition at time of admission) Patient's cognitive ability adequate to safely complete daily activities?: Yes Is the patient deaf or have difficulty hearing?: No Does the patient have difficulty seeing, even when wearing glasses/contacts?: No Does the patient have difficulty concentrating, remembering, or making decisions?: No Patient able to express need for assistance with ADLs?: Yes Does the patient have difficulty dressing or bathing?: No Independently performs ADLs?: Yes (appropriate for developmental age) Does the patient have difficulty walking or climbing stairs?: No Weakness of Legs: None Weakness of Arms/Hands: None  Permission  Sought/Granted                  Emotional Assessment              Admission diagnosis:  RSV bronchitis [J20.5] RSV infection [B33.8] Acute on chronic respiratory failure with hypoxia ([place]) [J96.21] Patient Active Problem List   Diagnosis Date Noted   Atrial flutter ([place]) 08/15/2022   RSV bronchitis 08/12/2022   Acute on chronic respiratory failure with hypoxia ([place]) 08/12/2022   COPD exacerbation ([place]) 08/12/2022   Atrial fibrillation with RVR ([place]) 08/12/2022   Myocardial injury 08/12/2022   Anxiety 08/12/2022   BPH (benign prostatic hyperplasia) 08/12/2022   Current chronic use of systemic steroids 07/05/2022   Chronic respiratory failure with hypoxia and hypercapnia ([place]) 05/31/2022   Degenerative disc disease, 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   Degenerative disc disease, 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   Annual physical exam 12/07/2020   Chronic sinusitis 12/07/2020   Double vision with both eyes open 12/07/2020   Left knee pain 12/07/2020   Hypoxemia 12/07/2020   Osteoarthritis of knee 12/07/2020   Benign prostatic hyperplasia with lower urinary tract symptoms 12/07/2020   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   Renal cyst 12/07/2020   Chronic obstructive pulmonary disease with (acute) exacerbation ([place]) 12/07/2020   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[place]) 12/07/2020   Contracture of palmar fascia 12/07/2020   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[place]) 12/07/2020   Alcohol dependence ([place]) 12/07/2020  Alcohol abuse    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[place])    Coronary artery disease    GERD (gastroesophageal reflux disease)    Hyperlipidemia    Hypertension    Kidney cysts    Pneumonia    Status post bronchoscopy 04/12/2020   Pneumothorax 04/12/2020   Acquired trigger finger of right middle finger 02/04/2020   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   Chronic respiratory failure with hypoxia ([place]) 08/10/2019   Coagulation disorder ([place]) 01/13/2019   Dysphagia 08/14/2018   History of acute otitis externa  08/14/2018   Coronary atherosclerosis 05/07/2018   Corns and callosities 04/09/2018   Diarrhea 04/09/2018   Enlarged prostate without lower urinary tract symptoms (luts) 04/09/2018   Male erectile dysfunction 04/09/2018   Cigarette nicotine dependence, uncomplicated 07/37/1062   Nocturia more than twice per night 04/09/2018   Pain in joint 04/09/2018   Pain in knee 04/09/2018   Pain of finger 04/09/2018   Polypharmacy 69/48/5462   Uncomplicated alcohol dependence ([place]) 04/09/2018   Ex-smoker 04/09/2018   Vitamin D deficiency 04/08/2018   Osteoarthritis of first carpometacarpal joint 04/08/2018   Gastro-esophageal reflux disease without esophagitis 04/08/2018   Mixed hyperlipidemia 04/08/2018   Flatulence 04/08/2018   Anal or rectal pain 04/08/2018   Elevated prostate specific antigen (PSA) 04/08/2018   Atherosclerotic heart disease of native coronary artery without angina pectoris 04/08/2018   Essential (primary) hypertension 04/08/2018   Sleep disorder 04/08/2018   Low back pain 04/08/2018   DOE (dyspnea on exertion) 04/02/2018   Pulmonary nodules 06/08/2016   COPD (chronic obstructive pulmonary disease) ([place]) 06/08/2016   PCP:  [name], MD Pharmacy:   [id number] [place] [id number]Phone: [phone]: [phone]    Social Determinants of Health (SDOH) Social History: SDOH Screenings   Food Insecurity: No Food Insecurity (08/13/2022)  Housing: Low Risk  (08/13/2022)  Transportation Needs: No Transportation Needs (08/13/2022)  Utilities: Not At Risk (08/13/2022)  Depression (PHQ2-9): Low Risk  (07/05/2022)  Tobacco Use: Medium Risk (08/13/2022)   SDOH Interventions:     Readmission Risk Interventions     No data to display

## 2022-08-16 NOTE — Progress Notes (Signed)
Per Seth Bake at [facility] patient is from their [place], from home with wife, has home O2.   Pending PT evals for recommendations.   Kelby Fam, [place], MSW, [place]

## 2022-08-16 NOTE — Progress Notes (Signed)
Progress Note  Patient Name: [name] [name]. Date of Encounter: 08/16/2022  Primary Cardiologist: Revankar  Subjective   Dyspnea improving. Still with cough, wheezing, and fatigue. Supplemental oxygen at 3 L.   Inpatient Medications    Scheduled Meds:  apixaban  5 mg Oral BID   diltiazem  180 mg Oral BID   finasteride  5 mg Oral Daily   gabapentin  100 mg Oral BID   ipratropium  0.5 mg Nebulization Q6H   levalbuterol  0.63 mg Nebulization Q6H   melatonin  5 mg Oral QHS   pantoprazole  40 mg Oral Daily   polyethylene glycol  17 g Oral Daily   [START ON 08/17/2022] predniSONE  40 mg Oral Q breakfast   rosuvastatin  20 mg Oral Daily   tamsulosin  0.4 mg Oral Daily   Continuous Infusions:  PRN Meds: acetaminophen, albuterol, clonazePAM, dextromethorphan-guaiFENesin, nitroGLYCERIN, ondansetron (ZOFRAN) IV   Vital Signs    Vitals:   08/16/22 0116 08/16/22 0453 08/16/22 0812 08/16/22 0914  BP:  (!) 146/72 (!) 150/71   Pulse:  88 88 87  Resp:  18 18 (!) 22  Temp:  97.7 F (36.5 C) 98.4 F (36.9 C)   TempSrc:   Oral   SpO2: 99% 100% 97% 96%  Weight:        Intake/Output Summary (Last 24 hours) at 08/16/2022 1153 Last data filed at 08/16/2022 0813 Gross per 24 hour  Intake --  Output 550 ml  Net -550 ml   Filed Weights   08/12/22 1432  Weight: 72.6 kg    Telemetry    Afib, 90s bpm - Personally Reviewed  ECG    No new tracings - Personally Reviewed  Physical Exam   GEN: No acute distress. Cough noted.  Neck: No JVD. Cardiac: IRIR, no murmurs, rubs, or gallops.  Respiratory: Diminished and course breath sounds bilaterally with expiratory wheezing.  GI: Soft, nontender, non-distended.   MS: No edema; No deformity. Neuro:  Alert and oriented x 3; Nonfocal.  Psych: Normal affect.  Labs    Chemistry Recent Labs  Lab 08/12/22 1224 08/13/22 0303 08/14/22 0501 08/15/22 0514 08/16/22 0146  NA 140   < > 136 135 136  K 3.8   < > 4.4 4.4 4.8  CL  101   < > 100 99 99  CO2 31   < > 28 29 32  GLUCOSE 100*   < > 156* 144* 153*  BUN 25*   < > 35* 41* 47*  CREATININE 1.21   < > 1.11 1.04 1.10  CALCIUM 8.7*   < > 8.3* 8.3* 8.4*  PROT 6.4*  --   --   --   --   ALBUMIN 3.6  --   --   --   --   AST 22  --   --   --   --   ALT 14  --   --   --   --   ALKPHOS 50  --   --   --   --   BILITOT 0.9  --   --   --   --   GFRNONAA >60   < > >60 >60 >60  ANIONGAP 8   < > '8 7 5   '$ < > = values in this interval not displayed.     Hematology Recent Labs  Lab 08/13/22 0303 08/14/22 0501 08/16/22 0146  WBC 8.1 11.7* 10.8*  RBC 3.54* 3.49* 3.52*  HGB 11.0* 10.9* 11.0*  HCT 34.9* 33.4* 33.4*  MCV 98.6 95.7 94.9  MCH 31.1 31.2 31.3  MCHC 31.5 32.6 32.9  RDW 15.0 14.9 14.6  PLT 232 255 260    Cardiac EnzymesNo results for input(s): "TROPONINI" in the last 168 hours. No results for input(s): "TROPIPOC" in the last 168 hours.   BNP Recent Labs  Lab 08/12/22 1227  BNP 61.0     DDimer No results for input(s): "DDIMER" in the last 168 hours.   Radiology    No results found.  Cardiac Studies   2D echo 08/06/2022: 1. Tachycardia noted. Left ventricular ejection fraction, by estimation,  is 65 to 70%. The left ventricle has normal function. The left ventricle  has no regional wall motion abnormalities. There is mild left ventricular  hypertrophy. Left ventricular  diastolic parameters are indeterminate. There is the interventricular  septum is flattened in systole and diastole, consistent with right  ventricular pressure and volume overload.   2. Right ventricular systolic function is normal. The right ventricular  size is normal. Tricuspid regurgitation signal is inadequate for assessing  PA pressure.   3. The mitral valve is normal in structure. No evidence of mitral valve  regurgitation. No evidence of mitral stenosis.   4. The aortic valve is normal in structure. Aortic valve regurgitation is  not visualized. No aortic stenosis  is present.   5. The inferior vena cava is normal in size with greater than 50%  respiratory variability, suggesting right atrial pressure of 3 mmHg.   Patient Profile     82 y.o. male with history of COPD on chronic home oxygen, pulmonary nodular disease, coronary artery disease, hypertension, hyperlipidemia, right subclavian artery stenosis and gastroesophageal reflux disease who is being seen for new onset Afib/flutter.   Assessment & Plan    1. Persistent Afib/flutter with RVR: -He remains in Afib with ventricular rates in the 90s bpm -Titrate Cardizem to 180 mg bid for added rate control -CHADS2VASc 4 (HTN, age x 2, vascular disease) -High risk for decompensation with sedation for TEE-guided DCCV with acute pulmonary illness -Plan for rate control for now with outpatient DCCV once he has improved from his acute pulmonary illness   2. CAD:  -No symptoms of angina -Mildly elevated high sensitivity troponin not consistent with ACS, suggestive of supply demand ischemia in the setting of underlying CAD with acute on chronic hypoxic respiratory failure and Afib with RVR -No plans for inpatient ischemic evaluation  -Now on Eliquis in place of antiplatelet given Afib/flutter -PTA [place]   3. Acute on chronic hypoxic respiratory failure: -Multifactorial including RSV, COPD exacerbation, and Afib/flutter with RVR -Ongoing management for Afib/flutter as above  -BUN/SCr trending up, less likely volume up currently  -Remaining therapy per IM  4. HTN: -Blood pressure  -Titrate Cardizem to 180 mg bid as above -Monitor   5. HLD: -LDL 52 in 07/2022 -Crestor 20 mg     For questions or updates, please contact [place] Please consult [email or web address] for contact info under Cardiology/STEMI.    Signed, Christell Faith, PA-C [facility] Pager: [phone] 08/16/2022, 11:53 AM

## 2022-08-17 ENCOUNTER — Inpatient Hospital Stay: Payer: Medicare Other

## 2022-08-17 DIAGNOSIS — J441 Chronic obstructive pulmonary disease with (acute) exacerbation: Secondary | ICD-10-CM | POA: Diagnosis not present

## 2022-08-17 DIAGNOSIS — J205 Acute bronchitis due to respiratory syncytial virus: Secondary | ICD-10-CM | POA: Diagnosis not present

## 2022-08-17 DIAGNOSIS — I4891 Unspecified atrial fibrillation: Secondary | ICD-10-CM | POA: Diagnosis not present

## 2022-08-17 DIAGNOSIS — J9621 Acute and chronic respiratory failure with hypoxia: Secondary | ICD-10-CM | POA: Diagnosis not present

## 2022-08-17 MED ORDER — AZITHROMYCIN 250 MG PO TABS
250.0000 mg | ORAL_TABLET | Freq: Every day | ORAL | Status: DC
  Administered 2022-08-17 – 2022-08-20 (×4): 250 mg via ORAL
  Filled 2022-08-17 (×4): qty 1

## 2022-08-17 NOTE — Evaluation (Signed)
Occupational Therapy Evaluation Patient Details Name: [name] [name]. MRN: [id number] DOB: 1941-01-19 Today's Date: 08/17/2022   History of Present Illness Pt is an 82 y.o. male presenting to hospital 08/12/22 with worsening SOB and productive cough last several days; pt noted with elevated HR.  (+) RSV.  Pt admited with acute on chronic respiratory failure with hypoxia and RSV infection; RSV induced COPD exacerbation; and a-fib with RVR (new onset).  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   Clinical Impression   Mr Bentsen was seen for OT evaluation this date. Prior to hospital admission, pt was MOD I for mobility using 3L [place]. Pt lives at [place]. Pt presents to acute OT demonstrating impaired ADL performance and functional mobility 2/2 decreased activity tolerance and functional strength/ROM/balance deficits. Pt currently requires CGA + RW sit<>stand, limited steps as pt reports feeling significantly SOB, noted to be wheezing. Returns to bed, RN notified. Tolerates ~10 min sitting EOB for grooming tasks, breaks for shortness of breath. Pt would benefit from skilled OT to address noted impairments and functional limitations (see below for any additional details). Upon hospital discharge, recommend STR to maximize pt safety and return to PLOF.   Recommendations for follow up therapy are one component of a multi-disciplinary discharge planning process, led by the attending physician.  Recommendations may be updated based on patient status, additional functional criteria and insurance authorization.   Follow Up Recommendations  Skilled nursing-short term rehab (<3 hours/day)     Assistance Recommended at Discharge Intermittent Supervision/Assistance  Patient can return home with the following A lot of help with walking and/or transfers;A lot of help with bathing/dressing/bathroom;Help with stairs or ramp for entrance    Functional Status  Assessment  Patient has had a recent decline in their functional status and demonstrates the ability to make significant improvements in function in a reasonable and predictable amount of time.  Equipment Recommendations  Other (comment) (defer)    Recommendations for Other Services       Precautions / Restrictions Precautions Precautions: Fall Restrictions Weight Bearing Restrictions: No      Mobility Bed Mobility Overal bed mobility: Modified Independent                  Transfers Overall transfer level: Needs assistance Equipment used: Rolling walker (2 wheels) Transfers: Sit to/from Stand Sit to Stand: Min guard           General transfer comment: initially unable to rise with MIN A, improves with O2 on 4L [place]      Balance Overall balance assessment: Needs assistance Sitting-balance support: No upper extremity supported, Feet supported Sitting balance-[name]: Good     Standing balance support: Bilateral upper extremity supported, During functional activity, Reliant on assistive device for balance Standing balance-[name]: Fair                             ADL either performed or assessed with clinical judgement   ADL Overall ADL's : Needs assistance/impaired                                       General ADL Comments: MAX A don B socks seated EOB. Unable to tolerate standign for groomingt asks, SUP to complete seated.      Pertinent Vitals/Pain Pain Assessment  Pain Assessment: No/denies pain     Hand Dominance     Extremity/Trunk Assessment Upper Extremity Assessment Upper Extremity Assessment: Overall WFL for tasks assessed   Lower Extremity Assessment Lower Extremity Assessment: Generalized weakness       Communication Communication Communication: No difficulties   Cognition Arousal/Alertness: Awake/alert Behavior During Therapy: WFL for tasks assessed/performed Overall Cognitive Status: Within  Functional Limits for tasks assessed                                       General Comments  SpO2 98% on 3L [place]     Home Living Family/patient expects to be discharged to:: Private residence [facility]) Living Arrangements: Spouse/significant other Available Help at Discharge: Family;Available PRN/intermittently Type of Home: House Home Access: Level entry     Home Layout: One level     Bathroom Shower/Tub: Occupational psychologist: Standard     Home Equipment: Rollator (4 wheels);Shower seat;Transport chair;Electric scooter   Additional Comments: home O2      Prior Functioning/Environment Prior Level of Function : Independent/Modified Independent;Driving             Mobility Comments: Independent ambulating without AD use in home; uses transport chair or electric scooter in community.  3 L home O2 at night; 0-3 L O2 at rest during day; 3-4.5 L O2 with activity during day.  No recent falls reported.          OT Problem List: Decreased strength;Decreased range of motion;Decreased activity tolerance;Impaired balance (sitting and/or standing);Cardiopulmonary status limiting activity      OT Treatment/Interventions: Self-care/ADL training;Therapeutic exercise;Energy conservation;DME and/or AE instruction;Therapeutic activities;Patient/family education;Balance training    OT Goals(Current goals can be found in the care plan section) Acute Rehab OT Goals Patient Stated Goal: to go home OT Goal Formulation: With patient Time For Goal Achievement: 08/31/22 Potential to Achieve Goals: Good ADL Goals Pt Will Perform Grooming: standing;with modified independence Pt Will Perform Lower Body Dressing: with modified independence;sit to/from stand Pt Will Transfer to Toilet: with modified independence;ambulating;regular height toilet  OT Frequency: Min 2X/week    Co-evaluation              AM-PAC OT "6 Clicks" Daily Activity     Outcome  Measure Help from another person eating meals?: None Help from another person taking care of personal grooming?: A Little Help from another person toileting, which includes using toliet, bedpan, or urinal?: A Lot Help from another person bathing (including washing, rinsing, drying)?: A Lot Help from another person to put on and taking off regular upper body clothing?: A Little Help from another person to put on and taking off regular lower body clothing?: A Lot 6 Click Score: 16   End of Session Equipment Utilized During Treatment: Rolling walker (2 wheels);Oxygen Nurse Communication: Mobility status  Activity Tolerance: Patient tolerated treatment well Patient left: in bed;with call bell/phone within reach  OT Visit Diagnosis: Other abnormalities of gait and mobility (R26.89);Muscle weakness (generalized) (M62.81)                Time: 4098-1191 OT Time Calculation (min): 16 min Charges:  OT General Charges $OT Visit: 1 Visit OT Evaluation $OT Eval Moderate Complexity: 1 Mod  Dessie Coma, M.S. OTR/L  08/17/22, 10:48 AM  ascom [phone]

## 2022-08-17 NOTE — Care Management Important Message (Signed)
Important Message  Patient Details  Name: [name] [name]. MRN: [id number] Date of Birth: 09/21/1940   Medicare Important Message Given:  Yes  Reviewed Medicare IM with patient via room phone [phone]).  Patient confirmed he received Medicare IM notice and has available to reference.     Dannette Barbara 08/17/2022, 1:31 PM

## 2022-08-17 NOTE — Consult Note (Signed)
PULMONOLOGY         Date: 08/17/2022,   MRN# [id number] [name] [name]. 09/11/1940     AdmissionWeight: 72.6 kg (recorded 06/2022)                 CurrentWeight: 72.6 kg (recorded 06/2022)  Referring provider: Dr. Dwyane Dee   CHIEF COMPLAINT:   Acute on chronic hypoxemic respiratory failure   HISTORY OF PRESENT ILLNESS   82 year old male with a history of dyslipidemia, alcohol abuse, essential hypertension BPH, advanced COPD with chronic hypoxemia on 3 L of oxygen via nasal cannula comes in with flulike illness and cough x 3 days with worsening shortness of breath.  On admission to the ER he also had atrial fibrillation with rapid ventricular response.  He was placed on BiPAP found to be positive for RSV.  He is hospitalized with findings of acute exacerbation of COPD and is having difficulty weaning off of oxygen with progressive dyspnea.  Cardiology is on board and has evaluated patient today.  During my evaluation patient did have an episode of not massive hemoptysis mixed in with thick and inspissated phlegm.  I reviewed his CT chest with findings of centrilobular emphysema and mild bilateral groundglass attenuation consistent with viral pneumonia.  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   Chronic obstructive pulmonary disease with (acute) exacerbation ([place]) 12/07/2020   Chronic respiratory failure with  hypoxia ([place]) 08/10/2019   Chronic sinusitis 12/07/2020   Cigarette nicotine dependence, uncomplicated 95/63/8756   Coagulation disorder ([place]) 01/13/2019   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   Contracture of palmar fascia 12/07/2020   COPD (chronic obstructive pulmonary disease) ([place])    Corn of toe 12/07/2020   Corns and callosities 04/09/2018   Coronary artery disease    2019 with stents   Coronary atherosclerosis 05/07/2018   Degenerative disc disease, 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   Degenerative disc disease, 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   Diarrhea 04/09/2018   Double vision with both eyes open 12/07/2020   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   Dysphagia 08/14/2018   Dyspnea    ED (erectile dysfunction)    ED (erectile dysfunction) of organic origin 12/07/2020   Elevated prostate specific antigen (PSA) 04/08/2018   Elevated PSA    Encounter for screening for other disorder 12/07/2020   Enlarged prostate without lower urinary tract symptoms (luts) 04/09/2018   Enthesopathy 12/07/2020   Essential (primary) hypertension 04/08/2018   Ex-smoker 04/09/2018   Exertional dyspnea 04/02/2018   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   Flatulence 04/08/2018   Gastro-esophageal reflux disease without esophagitis 04/08/2018   GERD (gastroesophageal reflux disease)    History of acute otitis externa 08/14/2018   Hyperlipidemia    Hypertension    Hypoxemia 12/07/2020   Kidney cysts    Left knee pain 12/07/2020   Low back pain 04/08/2018   Male erectile dysfunction 04/09/2018  Mixed hyperlipidemia 04/08/2018   Nocturia more than twice per night 04/09/2018   Osteoarthritis of first carpometacarpal joint 04/08/2018   Osteoarthritis of knee 12/07/2020   Other long term (current) drug therapy 12/07/2020   Pain due to onychomycosis of toenail of left foot 07/20/2019   Pain in joint 04/09/2018   Pain in knee 04/09/2018   Pain in unspecified knee  12/07/2020   Pain of finger 04/09/2018   Palpitations    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[place]) 12/07/2020   Personal history of colonic polyps 12/07/2020   Pneumonia    Pneumothorax 04/12/2020   Polypharmacy 04/09/2018   Porokeratosis 01/13/2019   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[place])    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/07/2020   Pulmonary nodules 06/08/2016   Pure hypercholesterolemia 12/07/2020   Renal cyst 12/07/2020   Sleep disorder 04/08/2018   Status post bronchoscopy 04/12/2020   Tear of rotator cuff 04/09/2018   Tobacco user 34/74/2595   Uncomplicated alcohol dependence ([place]) 04/09/2018   Unspecified rotator cuff tear or rupture of unspecified shoulder, not specified as traumatic 12/07/2020   Unspecified tear of unspecified meniscus, current injury, unspecified knee, initial encounter 12/07/2020   Visual disturbance 12/07/2020   Vitamin D deficiency 04/08/2018     SURGICAL HISTORY   Past Surgical History:  Procedure Laterality Date   BRONCHIAL BIOPSY  10/13/2019   Procedure: BRONCHIAL BIOPSIES;  Surgeon: Collene Gobble, MD;  Location: [place];  Service: Pulmonary;;   BRONCHIAL BIOPSY  04/12/2020   Procedure: BRONCHIAL BIOPSIES;  Surgeon: Collene Gobble, MD;  Location: [place];  Service: Pulmonary;;   BRONCHIAL BRUSHINGS  10/13/2019   Procedure: BRONCHIAL BRUSHINGS;  Surgeon: Collene Gobble, MD;  Location: [facility] ENDOSCOPY;  Service: Pulmonary;;   BRONCHIAL BRUSHINGS  04/12/2020   Procedure: BRONCHIAL BRUSHINGS;  Surgeon: Collene Gobble, MD;  Location: [facility] ENDOSCOPY;  Service: Pulmonary;;   BRONCHIAL NEEDLE ASPIRATION BIOPSY  10/13/2019   Procedure: BRONCHIAL NEEDLE ASPIRATION BIOPSIES;  Surgeon: Collene Gobble, MD;  Location: [facility] ENDOSCOPY;  Service: Pulmonary;;   BRONCHIAL NEEDLE ASPIRATION BIOPSY  04/12/2020   Procedure: BRONCHIAL NEEDLE ASPIRATION BIOPSIES;  Surgeon: Collene Gobble, MD;  Location: [facility] ENDOSCOPY;  Service: Pulmonary;;   BRONCHIAL WASHINGS  10/13/2019   Procedure:  BRONCHIAL WASHINGS;  Surgeon: Collene Gobble, MD;  Location: [facility] ENDOSCOPY;  Service: Pulmonary;;   BRONCHIAL WASHINGS  04/12/2020   Procedure: BRONCHIAL WASHINGS;  Surgeon: Collene Gobble, MD;  Location: [place] ENDOSCOPY;  Service: Pulmonary;;   CARDIAC CATHETERIZATION  2002   50% RCA   CATARACT EXTRACTION, BILATERAL     CORONARY STENT INTERVENTION N/A 04/15/2018   Procedure: CORONARY STENT INTERVENTION;  Surgeon: Jettie Booze, MD;  Location: [name] CV LAB;  Service: Cardiovascular;  Laterality: N/A;  om1   EYE SURGERY     KNEE ARTHROSCOPY     LEFT HEART CATH AND CORONARY ANGIOGRAPHY N/A 04/15/2018   Procedure: LEFT HEART CATH AND CORONARY ANGIOGRAPHY;  Surgeon: Jettie Booze, MD;  Location: [place] CV LAB;  Service: Cardiovascular;  Laterality: N/A;   MULTIPLE TOOTH EXTRACTIONS     TONSILLECTOMY     VIDEO BRONCHOSCOPY  04/12/2020   VIDEO BRONCHOSCOPY WITH ENDOBRONCHIAL NAVIGATION N/A 07/26/2016   Procedure: VIDEO BRONCHOSCOPY WITH ENDOBRONCHIAL NAVIGATION;  Surgeon: Collene Gobble, MD;  Location: [place];  Service: Thoracic;  Laterality: N/A;   VIDEO BRONCHOSCOPY WITH ENDOBRONCHIAL NAVIGATION N/A 10/13/2019   Procedure: [facility] AND VIDEO BRONCHOSCOPY WITH ENDOBRONCHIAL NAVIGATION;  Surgeon: Collene Gobble, MD;  Location: [facility]  ENDOSCOPY;  Service: Pulmonary;  Laterality: N/A;   VIDEO BRONCHOSCOPY WITH ENDOBRONCHIAL NAVIGATION N/A 04/12/2020   Procedure: VIDEO BRONCHOSCOPY WITH ENDOBRONCHIAL NAVIGATION;  Surgeon: Collene Gobble, MD;  Location: [place] ENDOSCOPY;  Service: Pulmonary;  Laterality: N/A;     FAMILY HISTORY   Family History  Problem Relation Age of Onset   Hypertension Mother    Alzheimer's disease Mother      SOCIAL HISTORY   Social History   Tobacco Use   Smoking status: Former    Packs/day: 0.75    Years: 67.00    Total pack years: 50.25    Types: Cigarettes    Start date: 64    Quit date: 07/13/2020    Years since quitting: 2.0    Passive  exposure: Never   Smokeless tobacco: Never   Tobacco comments:    Recent Quit    Vaping Use   Vaping Use: Former  Substance Use Topics   Alcohol use: No    Alcohol/week: 0.0 standard drinks of alcohol    Comment: quit drinking 2014   Drug use: No     MEDICATIONS    Home Medication:    Current Medication:  Current Facility-Administered Medications:    acetaminophen (TYLENOL) tablet 650 mg, 650 mg, Oral, Q6H PRN, Ivor Costa, MD, 650 mg at 08/16/22 2123   albuterol (PROVENTIL) (2.5 MG/3ML) 0.083% nebulizer solution 2.5 mg, 2.5 mg, Nebulization, Q4H PRN, Ivor Costa, MD, 2.5 mg at 08/17/22 0521   apixaban (ELIQUIS) tablet 5 mg, 5 mg, Oral, BID, Emeterio Reeve, DO, 5 mg at 08/17/22 0350   clonazePAM (KLONOPIN) tablet 0.5 mg, 0.5 mg, Oral, Daily PRN, Ivor Costa, MD, 0.5 mg at 08/17/22 0554   dextromethorphan-guaiFENesin ([place] DM) 30-600 MG per 12 hr tablet 1 tablet, 1 tablet, Oral, BID PRN, Ivor Costa, MD   diltiazem (CARDIZEM CD) 24 hr capsule 180 mg, 180 mg, Oral, BID, Dunn, Ryan M, PA-C, 180 mg at 08/17/22 0851   finasteride (PROSCAR) tablet 5 mg, 5 mg, Oral, Daily, Ivor Costa, MD, 5 mg at 08/17/22 0938   gabapentin (NEURONTIN) capsule 100 mg, 100 mg, Oral, BID, Ivor Costa, MD, 100 mg at 08/17/22 0852   ipratropium (ATROVENT) nebulizer solution 0.5 mg, 0.5 mg, Nebulization, Q6H, Ivor Costa, MD, 0.5 mg at 08/17/22 0827   levalbuterol (XOPENEX) nebulizer solution 0.63 mg, 0.63 mg, Nebulization, Q6H, Ivor Costa, MD, 0.63 mg at 08/17/22 0827   melatonin tablet 5 mg, 5 mg, Oral, QHS, Niu, Soledad Gerlach, MD, 5 mg at 08/16/22 2123   nitroGLYCERIN (NITROSTAT) SL tablet 0.4 mg, 0.4 mg, Sublingual, Q5 min PRN, Ivor Costa, MD   ondansetron (ZOFRAN) injection 4 mg, 4 mg, Intravenous, Q8H PRN, Ivor Costa, MD   pantoprazole (PROTONIX) EC tablet 40 mg, 40 mg, Oral, Daily, Ivor Costa, MD, 40 mg at 08/17/22 0851   polyethylene glycol (MIRALAX / GLYCOLAX) packet 17 g, 17 g, Oral, Daily, Shawna Clamp,  MD, 17 g at 08/17/22 1829   predniSONE (DELTASONE) tablet 40 mg, 40 mg, Oral, Q breakfast, Shawna Clamp, MD, 40 mg at 08/17/22 9371   rosuvastatin (CRESTOR) tablet 20 mg, 20 mg, Oral, Daily, Ivor Costa, MD, 20 mg at 08/17/22 0851   tamsulosin (FLOMAX) capsule 0.4 mg, 0.4 mg, Oral, Daily, Ivor Costa, MD, 0.4 mg at 08/17/22 0851    ALLERGIES   Flagyl [metronidazole]     REVIEW OF SYSTEMS    Review of Systems:  Gen:  Denies  fever, sweats, chills weigh loss  HEENT: Denies blurred vision,  double vision, ear pain, eye pain, hearing loss, nose bleeds, sore throat Cardiac:  No dizziness, chest pain or heaviness, chest tightness,edema Resp:   reports dyspnea chronically  Gi: Denies swallowing difficulty, stomach pain, nausea or vomiting, diarrhea, constipation, bowel incontinence Gu:  Denies bladder incontinence, burning urine Ext:   Denies Joint pain, stiffness or swelling Skin: Denies  skin rash, easy bruising or bleeding or hives Endoc:  Denies polyuria, polydipsia , polyphagia or weight change Psych:   Denies depression, insomnia or hallucinations   Other:  All other systems negative   VS: BP 128/60 (BP Location: Left Arm)   Pulse 89   Temp (!) 97.5 F (36.4 C) (Oral)   Resp 18   Wt 72.6 kg Comment: recorded 06/2022  SpO2 100%   BMI 22.64 kg/m      PHYSICAL EXAM    GENERAL:NAD, no fevers, chills, no weakness no fatigue HEAD: Normocephalic, atraumatic.  EYES: Pupils equal, round, reactive to light. Extraocular muscles intact. No scleral icterus.  MOUTH: Moist mucosal membrane. Dentition intact. No abscess noted.  EAR, NOSE, THROAT: Clear without exudates. No external lesions.  NECK: Supple. No thyromegaly. No nodules. No JVD.  PULMONARY: decreased breath sounds with mild rhonchi worse at bases bilaterally.  CARDIOVASCULAR: S1 and S2. Regular rate and rhythm. No murmurs, rubs, or gallops. No edema. Pedal pulses 2+ bilaterally.  GASTROINTESTINAL: Soft, nontender,  nondistended. No masses. Positive bowel sounds. No hepatosplenomegaly.  MUSCULOSKELETAL: No swelling, clubbing, or edema. Range of motion full in all extremities.  NEUROLOGIC: Cranial nerves II through XII are intact. No gross focal neurological deficits. Sensation intact. Reflexes intact.  SKIN: No ulceration, lesions, rashes, or cyanosis. Skin warm and dry. Turgor intact.  PSYCHIATRIC: Mood, affect within normal limits. The patient is awake, alert and oriented x 3. Insight, judgment intact.       IMAGING     ASSESSMENT/PLAN   Severe acute exacerbation of COPD -Present on admission due to viral RSV infection -Continue nebulizer therapy with Xopenex, will DC ipratropium due to arrhythmogenic potential,  -Have initiated Zithromax p.o. 250 mg, continue with prednisone currently at 40 mg. -Incentive spirometry and flutter valve, patient is using these devices during my eval   Non-massive hemoptysis -This is likely due to anticoagulation and is not clinically significant at this time I would not recommend to DC anticoagulation at this time and would monitor only in hopes of resolution post treatment of underlying COPD exacerbation   Persistent atrial fibrillation/flutter with rapid ventricular response -This is certainly contributing to dyspnea and shortness of breath and should improve post cardiac therapy.  Cardiology team is on board-appreciate input  Thank you for allowing me to participate in the care of this patient.   Patient/Family are satisfied with care plan and all questions have been answered.    Provider disclosure: Patient with at least one acute or chronic illness or injury that poses a threat to life or bodily function and is being managed actively during this encounter.  All of the below services have been performed independently by signing provider:  review of prior documentation from internal and or external health records.  Review of previous and current lab  results.  Interview and comprehensive assessment during patient visit today. Review of current and previous chest radiographs/CT scans. Discussion of management and test interpretation with health care team and patient/family.   This document was prepared using Dragon voice recognition software and may include unintentional dictation errors.     Ottie Glazier, M.D.  Division of Pulmonary & Critical Care Medicine

## 2022-08-17 NOTE — Progress Notes (Addendum)
Progress Note  Patient Name: [name] [name]. Date of Encounter: 08/17/2022  Primary Cardiologist: Revankar  Subjective   No chest pain. Cough becoming productive. Supplemental oxygen at 3 L. Ambulated this morning with some dyspnea. CXR today suggestive of bronchitis/reactive airway disease along with scarring of the right mid lung and emphysema.   Inpatient Medications    Scheduled Meds:  apixaban  5 mg Oral BID   diltiazem  180 mg Oral BID   finasteride  5 mg Oral Daily   gabapentin  100 mg Oral BID   ipratropium  0.5 mg Nebulization Q6H   levalbuterol  0.63 mg Nebulization Q6H   melatonin  5 mg Oral QHS   pantoprazole  40 mg Oral Daily   polyethylene glycol  17 g Oral Daily   predniSONE  40 mg Oral Q breakfast   rosuvastatin  20 mg Oral Daily   tamsulosin  0.4 mg Oral Daily   Continuous Infusions:  PRN Meds: acetaminophen, albuterol, clonazePAM, dextromethorphan-guaiFENesin, nitroGLYCERIN, ondansetron (ZOFRAN) IV   Vital Signs    Vitals:   08/17/22 0351 08/17/22 0832 08/17/22 0844 08/17/22 1149  BP: 117/81  (!) 149/67 128/60  Pulse: 87  87 89  Resp: '18  19 18  '$ Temp: 97.7 F (36.5 C)  (!) 97.5 F (36.4 C) (!) 97.5 F (36.4 C)  TempSrc: Oral  Oral Oral  SpO2: 100% 99% 100% 100%  Weight:        Intake/Output Summary (Last 24 hours) at 08/17/2022 1214 Last data filed at 08/17/2022 0855 Gross per 24 hour  Intake --  Output 1550 ml  Net -1550 ml    Filed Weights   08/12/22 1432  Weight: 72.6 kg    Telemetry    Afib, 90s bpm - Personally Reviewed  ECG    No new tracings - Personally Reviewed  Physical Exam   GEN: No acute distress. Cough noted.  Neck: No JVD. Cardiac: IRIR, no murmurs, rubs, or gallops.  Respiratory: Diminished and course breath sounds bilaterally with expiratory wheezing.  GI: Soft, nontender, non-distended.   MS: No edema; No deformity. Neuro:  Alert and oriented x 3; Nonfocal.  Psych: Normal affect.  Labs     Chemistry Recent Labs  Lab 08/12/22 1224 08/13/22 0303 08/14/22 0501 08/15/22 0514 08/16/22 0146  NA 140   < > 136 135 136  K 3.8   < > 4.4 4.4 4.8  CL 101   < > 100 99 99  CO2 31   < > 28 29 32  GLUCOSE 100*   < > 156* 144* 153*  BUN 25*   < > 35* 41* 47*  CREATININE 1.21   < > 1.11 1.04 1.10  CALCIUM 8.7*   < > 8.3* 8.3* 8.4*  PROT 6.4*  --   --   --   --   ALBUMIN 3.6  --   --   --   --   AST 22  --   --   --   --   ALT 14  --   --   --   --   ALKPHOS 50  --   --   --   --   BILITOT 0.9  --   --   --   --   GFRNONAA >60   < > >60 >60 >60  ANIONGAP 8   < > '8 7 5   '$ < > = values in this interval not displayed.  Hematology Recent Labs  Lab 08/13/22 0303 08/14/22 0501 08/16/22 0146  WBC 8.1 11.7* 10.8*  RBC 3.54* 3.49* 3.52*  HGB 11.0* 10.9* 11.0*  HCT 34.9* 33.4* 33.4*  MCV 98.6 95.7 94.9  MCH 31.1 31.2 31.3  MCHC 31.5 32.6 32.9  RDW 15.0 14.9 14.6  PLT 232 255 260     Cardiac EnzymesNo results for input(s): "TROPONINI" in the last 168 hours. No results for input(s): "TROPIPOC" in the last 168 hours.   BNP Recent Labs  Lab 08/12/22 1227  BNP 61.0      DDimer No results for input(s): "DDIMER" in the last 168 hours.   Radiology    DG Chest Port 1 View  Result Date: 08/17/2022 IMPRESSION: 1. Airway thickening is present, suggesting bronchitis or reactive airways disease. 2. Stable scarring in the right mid lung. 3. Emphysema. 4. Mild chronic interstitial accentuation. Electronically Signed   By: Van Clines M.D.   On: 08/17/2022 10:10    Cardiac Studies   2D echo 08/06/2022: 1. Tachycardia noted. Left ventricular ejection fraction, by estimation,  is 65 to 70%. The left ventricle has normal function. The left ventricle  has no regional wall motion abnormalities. There is mild left ventricular  hypertrophy. Left ventricular  diastolic parameters are indeterminate. There is the interventricular  septum is flattened in systole and diastole,  consistent with right  ventricular pressure and volume overload.   2. Right ventricular systolic function is normal. The right ventricular  size is normal. Tricuspid regurgitation signal is inadequate for assessing  PA pressure.   3. The mitral valve is normal in structure. No evidence of mitral valve  regurgitation. No evidence of mitral stenosis.   4. The aortic valve is normal in structure. Aortic valve regurgitation is  not visualized. No aortic stenosis is present.   5. The inferior vena cava is normal in size with greater than 50%  respiratory variability, suggesting right atrial pressure of 3 mmHg.   Patient Profile     82 y.o. male with history of COPD on chronic home oxygen, pulmonary nodular disease, coronary artery disease, hypertension, hyperlipidemia, right subclavian artery stenosis and gastroesophageal reflux disease who is being seen for new onset Afib/flutter.   Assessment & Plan    1. Persistent Afib/flutter with RVR: -He remains in Afib with ventricular rates in the 80s to 90s bpm -Continue titrated dose of Cardizem to 180 mg bid for added rate control -CHADS2VASc 4 (HTN, age x 2, vascular disease) -Eliquis 5 mg bid (does not meet reduced dosing criteria currently) -High risk for decompensation with sedation for TEE-guided DCCV with acute pulmonary illness -Plan for rate control for now with outpatient DCCV once he has improved from his acute pulmonary illness   2. CAD:  -No symptoms of angina -Mildly elevated high sensitivity troponin not consistent with ACS, suggestive of supply demand ischemia in the setting of underlying CAD with acute on chronic hypoxic respiratory failure and Afib with RVR -No plans for inpatient ischemic evaluation  -Now on Eliquis in place of antiplatelet given Afib/flutter -PTA Crestor   3. Acute on chronic hypoxic respiratory failure: -Multifactorial including RSV, COPD exacerbation, and Afib/flutter with RVR -Ongoing management for  Afib/flutter as above  -BUN/SCr trending up, less likely volume up currently  -Question of flutter vest  -Remaining therapy per IM/pulmonology   4. HTN: -Blood pressure improved Cardizem BID -Monitor   5. HLD: -LDL 52 in 07/2022 -Crestor 20 mg   [facility] will sign off.  Medication Recommendations: Continue current medications Other recommendations (labs, testing, etc): No further testing Follow up as an outpatient: Outpatient follow-up in cardiology clinic after respiratory status improves   For questions or updates, please contact [place] Please consult [email or web address] for contact info under Cardiology/STEMI.    Signed, Christell Faith, PA-C [facility] Pager: [phone] 08/17/2022, 12:14 PM

## 2022-08-17 NOTE — TOC Progression Note (Signed)
Transition of Care (TOC) - Progression Note    Patient Details  Name: [name] [name]. MRN: [id number] Date of Birth: 1941-06-01  Transition of Care [facility]) CM/SW [place], [place] Phone Number: 08/17/2022, 11:00 AM  Clinical Narrative:     Insurance auth achieved  [id number]  Seth Bake at [facility] updated, per MD not medically cleared to dc today. Auth good through 1/23.  Expected Discharge Plan: [place] Barriers to Discharge: Continued Medical Work up  Expected Discharge Plan and Services       Living arrangements for the past 2 months: [place]                                       Social Determinants of Health (SDOH) Interventions SDOH Screenings   Food Insecurity: No Food Insecurity (08/13/2022)  Housing: Low Risk  (08/13/2022)  Transportation Needs: No Transportation Needs (08/13/2022)  Utilities: Not At Risk (08/13/2022)  Depression (PHQ2-9): Low Risk  (07/05/2022)  Tobacco Use: Medium Risk (08/13/2022)    Readmission Risk Interventions     No data to display

## 2022-08-17 NOTE — Progress Notes (Signed)
PROGRESS NOTE    [name] [name].  NGE:[id number] DOB: 11-16-1940 DOA: 08/12/2022  PCP: [name], MD   Brief Narrative:  This 82 y.o. 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  01/14: Patient was found to have severe respiratory distress and severe tachycardia with heart rates up to 180s.  Found to have new onset atrial fibrillation.  He cannot speak in full sentence, using accessory muscle for breathing, initially started on CPAP but still has respiratory distress.  BiPAP is started in the ED. (+)PCR for RSV, BNP 61, lactic acid normal 1.7, WBC 13.6, GFR> 60, blood pressure 130/59, RR 28.  ABG with pH 7.32, CO2 68, O2 43.  Chest x-ray showed COPD without infiltration.  Patient is admitted to stepdown as inpatient. Later in the afternoon was transitioned to [place] O2 2L/min 01/15: maintaining on [place] O2 2-4L. HR improved. Cardizem gtt d/c overnight d/t bradycardia. Afib --> Aflutter. Remains on heparin gtt. Cardiology [facility]) saw patient - "If remains symptomatic with shortness of breath and palpitations can consider TEE/DCCV"  01/16: remains significantly SOB, titrating rate control meds. 01/17: Heart rate is well-controlled.  Remains in atrial flutter.  Plan for DC cardioversion as an outpatient after acute illness.  Assessment & Plan:   Principal Problem:   RSV bronchitis Active Problems:   Acute on chronic respiratory failure with hypoxia (HCC)   COPD exacerbation (HCC)   Atrial fibrillation with RVR (HCC)   Coronary artery disease   Myocardial injury   Hypertension   Hyperlipidemia   BPH (benign prostatic hyperplasia)   Anxiety   Atrial flutter (HCC)  Acute on chronic hypoxic respiratory failure, in the setting of RSV infection: RSV induced COPD exacerbation: Patient was found to be hypoxic requiring BiPAP on  arrival.  Now weaned down to [place] Continue nebulized bronchodilators Initiated on Solu-Medrol 40 mg IV bid, then transitioned to prednisone 40 mg daily Continue Mucinex for cough  Incentive spirometry. F/u :sputum culture Continue Supplemental O2 - he is usually on 2-4L/min at home  Reports he is about at baseline, still significant SOB, still has hypoxia while ambulation. Pulmonology consulted for evaluation.  Continued to remain with significant dyspnea while ambulation.   Persistent atrial fibrillation with RVR: Heart rate is now well-controlled, remains in A.Fibrillation. CHADS2 score is 4.  Started on heparin infusion. Patient had 2D echo 08/06/2022 which showed LVEF of 65 to 70% Cardizem drip discontinued. Continue Cardizem 180 mg twice daily Continue IV metoprolol 5 mg every 2 hours for heart rate> 125 IV heparin transitioned with Eliquis. Plan for cardioversion as outpatient after acute illness resolves. Cardiology signed off.   Mild volume overload: Resolved with 1 dose of IV Lasix.   Coronary artery disease.  ACS ruled out. Trend troponin --> flat  Continue Crestor D/c Plavix now that on Eliquis Discontinue aspirin.  Continue as needed nitroglycerin.   Hypertension: Continue IV hydralazine as needed. Hold Diovan , Continue Cardizem   Hyperlipidemia Continue Crestor.   BPH (benign prostatic hyperplasia) Continue Proscar and Flomax.   Anxiety Continue As needed Klonopin.   DVT prophylaxis: Heparin gtt Code Status:Full code. Family Communication: No family at bed side. Disposition Plan:   Status is: Inpatient Remains inpatient appropriate because: Admitted for new onset A-fib requiring Cardizem infusion.  Cardiology consulted, medication adjusted.  Recommended cardioversion once acute event resolved.  Consultants:  Cardiology  Procedures:  Antimicrobials:  Anti-infectives (From admission, onward)    Start     Dose/Rate Route Frequency Ordered Stop    08/17/22 1315  azithromycin (ZITHROMAX) tablet 250 mg        250 mg Oral Daily 08/17/22 1227        Subjective: Patient seen and examined at bedside.  Overnight events noted. Patient reports doing better denies any chest pain,   He still reports having congestion,  cough and shortness of breath while ambulation.  Objective: Vitals:   08/17/22 0832 08/17/22 0844 08/17/22 1149 08/17/22 1405  BP:  (!) 149/67 128/60   Pulse:  87 89   Resp:  19 18   Temp:  (!) 97.5 F (36.4 C) (!) 97.5 F (36.4 C)   TempSrc:  Oral Oral   SpO2: 99% 100% 100% 100%  Weight:        Intake/Output Summary (Last 24 hours) at 08/17/2022 1406 Last data filed at 08/17/2022 0855 Gross per 24 hour  Intake --  Output 1550 ml  Net -1550 ml   Filed Weights   08/12/22 1432  Weight: 72.6 kg    Examination:  General exam: Appears comfortable, not in any acute distress.  Deconditioned Respiratory system: CTA bilaterally, respiratory effort normal, RR 14. Cardiovascular system: S1-S2 heard, irregular rhythm, no murmur. Gastrointestinal system: Abdomen is soft, non tender, non distended, BS+ Central nervous system: Alert and oriented x 3. No focal neurological deficits. Extremities: No edema, No cyanosis, No clubbing. Skin: No rashes, lesions or ulcers Psychiatry: Judgement and insight appear normal. Mood & affect appropriate.     Data Reviewed: I have personally reviewed following labs and imaging studies  CBC: Recent Labs  Lab 08/12/22 1224 08/13/22 0303 08/14/22 0501 08/16/22 0146  WBC 13.6* 8.1 11.7* 10.8*  NEUTROABS 10.6*  --   --   --   HGB 12.5* 11.0* 10.9* 11.0*  HCT 39.7 34.9* 33.4* 33.4*  MCV 98.8 98.6 95.7 94.9  PLT 317 232 255 182   Basic Metabolic Panel: Recent Labs  Lab 08/12/22 1224 08/13/22 0303 08/14/22 0501 08/15/22 0514 08/16/22 0146  NA 140 137 136 135 136  K 3.8 4.7 4.4 4.4 4.8  CL 101 102 100 99 99  CO2 '31 27 28 29 '$ 32  GLUCOSE 100* 169* 156* 144* 153*  BUN 25*  27* 35* 41* 47*  CREATININE 1.21 1.14 1.11 1.04 1.10  CALCIUM 8.7* 8.1* 8.3* 8.3* 8.4*  MG 2.0  --   --   --  2.4  PHOS  --   --   --   --  4.1   GFR: Estimated Creatinine Clearance: 54.1 mL/min (by C-G formula based on SCr of 1.1 mg/dL). Liver Function Tests: Recent Labs  Lab 08/12/22 1224  AST 22  ALT 14  ALKPHOS 50  BILITOT 0.9  PROT 6.4*  ALBUMIN 3.6   No results for input(s): "LIPASE", "AMYLASE" in the last 168 hours. No results for input(s): "AMMONIA" in the last 168 hours. Coagulation Profile: Recent Labs  Lab 08/12/22 1606  INR 1.1   Cardiac Enzymes: No results for input(s): "CKTOTAL", "CKMB", "CKMBINDEX", "TROPONINI" in the last 168 hours. BNP (last 3 results) Recent Labs    05/30/22 1106  PROBNP 34.0   HbA1C: No results for input(s): "HGBA1C" in the last 72 hours.  CBG: No results for input(s): "GLUCAP" in the last 168 hours. Lipid Profile: No results for input(s): "CHOL", "HDL", "LDLCALC", "TRIG", "CHOLHDL", "LDLDIRECT" in the last  72 hours.  Thyroid Function Tests: No results for input(s): "TSH", "T4TOTAL", "FREET4", "T3FREE", "THYROIDAB" in the last 72 hours. Anemia Panel: No results for input(s): "VITAMINB12", "FOLATE", "FERRITIN", "TIBC", "IRON", "RETICCTPCT" in the last 72 hours. Sepsis Labs: Recent Labs  Lab 08/12/22 1227 08/12/22 1438  LATICACIDVEN 1.7 1.4    Recent Results (from the past 240 hour(s))  Resp panel by RT-PCR (RSV, Flu A&B, Covid) Anterior Nasal Swab     Status: Abnormal   Collection Time: 08/12/22 12:27 PM   Specimen: Anterior Nasal Swab  Result Value Ref Range Status   SARS Coronavirus 2 by RT PCR NEGATIVE NEGATIVE Final    Comment: (NOTE) SARS-CoV-2 target nucleic acids are NOT DETECTED.  The SARS-CoV-2 RNA is generally detectable in upper respiratory specimens during the acute phase of infection. The lowest concentration of SARS-CoV-2 viral copies this assay can detect is 138 copies/mL. A negative result does not  preclude SARS-Cov-2 infection and should not be used as the sole basis for treatment or other patient management decisions. A negative result may occur with  improper specimen collection/handling, submission of specimen other than nasopharyngeal swab, presence of viral mutation(s) within the areas targeted by this assay, and inadequate number of viral copies(<138 copies/mL). A negative result must be combined with clinical observations, patient history, and epidemiological information. The expected result is Negative.  Fact Sheet for Patients:  EntrepreneurPulse.com.au  Fact Sheet for Healthcare Providers:  IncredibleEmployment.be  This test is no t yet approved or cleared by the [place] FDA and  has been authorized for detection and/or diagnosis of SARS-CoV-2 by FDA under an Emergency Use Authorization (EUA). This EUA will remain  in effect (meaning this test can be used) for the duration of the COVID-19 declaration under Section 564(b)(1) of the Act, 21 U.S.C.section 360bbb-3(b)(1), unless the authorization is terminated  or revoked sooner.       Influenza A by PCR NEGATIVE NEGATIVE Final   Influenza B by PCR NEGATIVE NEGATIVE Final    Comment: (NOTE) The Xpert Xpress SARS-CoV-2/FLU/RSV plus assay is intended as an aid in the diagnosis of influenza from Nasopharyngeal swab specimens and should not be used as a sole basis for treatment. Nasal washings and aspirates are unacceptable for Xpert Xpress SARS-CoV-2/FLU/RSV testing.  Fact Sheet for Patients: EntrepreneurPulse.com.au  Fact Sheet for Healthcare Providers: IncredibleEmployment.be  This test is not yet approved or cleared by the [place] FDA and has been authorized for detection and/or diagnosis of SARS-CoV-2 by FDA under an Emergency Use Authorization (EUA). This EUA will remain in effect (meaning this test can be used) for the  duration of the COVID-19 declaration under Section 564(b)(1) of the Act, 21 U.S.C. section 360bbb-3(b)(1), unless the authorization is terminated or revoked.     Resp Syncytial Virus by PCR POSITIVE (A) NEGATIVE Final    Comment: (NOTE) Fact Sheet for Patients: EntrepreneurPulse.com.au  Fact Sheet for Healthcare Providers: IncredibleEmployment.be  This test is not yet approved or cleared by the [place] FDA and has been authorized for detection and/or diagnosis of SARS-CoV-2 by FDA under an Emergency Use Authorization (EUA). This EUA will remain in effect (meaning this test can be used) for the duration of the COVID-19 declaration under Section 564(b)(1) of the Act, 21 U.S.C. section 360bbb-3(b)(1), unless the authorization is terminated or revoked.  Performed at [facility], [address] [place], [place] [address]   Expectorated Sputum Assessment w Gram Stain, Rflx to Resp Cult     Status: None  Collection Time: 08/12/22  4:39 PM   Specimen: Sputum  Result Value Ref Range Status   Specimen Description SPUTUM  Final   Special Requests NONE  Final   Sputum evaluation   Final    Sputum specimen not acceptable for testing.  Please recollect.   NOTIFIED ASHLEY TREXLER ON 08/12/22 AT 1735 QSD Performed at [facility], [address] [place], [place] [address]    Report Status 08/12/2022 FINAL  Final  Expectorated Sputum Assessment w Gram Stain, Rflx to Resp Cult     Status: None   Collection Time: 08/13/22 10:52 AM   Specimen: Sputum  Result Value Ref Range Status   Specimen Description SPU  Final   Special Requests SPU  Final   Sputum evaluation   Final    THIS SPECIMEN IS ACCEPTABLE FOR SPUTUM CULTURE Performed at [facility], [address] [place], [place] [address]    Report Status 08/13/2022 FINAL  Final  Culture, Respiratory w Gram Stain     Status: None   Collection Time: 08/13/22 10:52  AM   Specimen: Sputum  Result Value Ref Range Status   Specimen Description   Final    SPU Performed at [facility], [address], [place], [place] [address]    Special Requests   Final    SPU Reflexed from [id number] Performed at [facility], [address] [place], [place] [address]    Gram Stain   Final    RARE WBC PRESENT, PREDOMINANTLY PMN MODERATE GRAM POSITIVE COCCI IN CHAINS FEW GRAM NEGATIVE RODS FEW YEAST PREVIOUSLY REPORTED AS: MODERATE GRAM NEGATIVE RODS MODERATE WBC PRESENT, PREDOMINANTLY PMN CORRECTED RESULTS CALLED TO:  C/  A. DAVIS, RN 08/13/22 2023 A. LAFRANCE    Culture   Final    Normal respiratory flora-no Staph aureus or Pseudomonas seen Performed at [facility] [address], [place], [place] [address]    Report Status 08/16/2022 FINAL  Final    Radiology Studies: DG Chest Port 1 View  Result Date: 08/17/2022 CLINICAL DATA:  Shortness of breath starting this morning. History of COPD. EXAM: PORTABLE CHEST 1 VIEW COMPARISON:  08/12/2022 and CT chest 03/07/2022 FINDINGS: Tapering of the peripheral pulmonary vasculature favors emphysema. Heart size within normal limits. Stable scarring in the right mid lung. No edema observed. Mild chronic interstitial accentuation. Airway thickening is present, suggesting bronchitis or reactive airways disease. Bilateral degenerative AC joint spurring. IMPRESSION: 1. Airway thickening is present, suggesting bronchitis or reactive airways disease. 2. Stable scarring in the right mid lung. 3. Emphysema. 4. Mild chronic interstitial accentuation. Electronically Signed   By: Van Clines M.D.   On: 08/17/2022 10:10    Scheduled Meds:  apixaban  5 mg Oral BID   azithromycin  250 mg Oral Daily   diltiazem  180 mg Oral BID   finasteride  5 mg Oral Daily   gabapentin  100 mg Oral BID   ipratropium  0.5 mg Nebulization Q6H   levalbuterol  0.63 mg Nebulization Q6H   melatonin  5 mg Oral QHS    pantoprazole  40 mg Oral Daily   polyethylene glycol  17 g Oral Daily   predniSONE  40 mg Oral Q breakfast   rosuvastatin  20 mg Oral Daily   tamsulosin  0.4 mg Oral Daily   Continuous Infusions:   LOS: 5 days    Time spent: 35 mins    Shawna Clamp, MD [facility]   If  7PM-7AM, please contact night-coverage

## 2022-08-17 NOTE — Progress Notes (Signed)
Physical Therapy Treatment Patient Details Name: [name] [name]. MRN: [id number] DOB: 11/08/1940 Today's Date: 08/17/2022   History of Present Illness Pt is an 82 y.o. male presenting to hospital 08/12/22 with worsening SOB and productive cough last several days; pt noted with elevated HR.  (+) RSV.  Pt admited with acute on chronic respiratory failure with hypoxia and RSV infection; RSV induced COPD exacerbation; and a-fib with RVR (new onset).  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    PT Comments    Pt resting in bed upon PT arrival; pt reports just getting back to bed and did not want to get up again at this time but was agreeable to ex's in bed.  Performed B LE AROM ex's in bed--pt noted with increased SOB and work of breathing with more challenging LE ex's but breathing improved with rest break between ex's (O2 sats 100% on 3 L O2 via nasal cannula during sessions activities).  Will continue to focus on strengthening, pacing, activity tolerance, and progressive functional mobility per pt tolerance.    Recommendations for follow up therapy are one component of a multi-disciplinary discharge planning process, led by the attending physician.  Recommendations may be updated based on patient status, additional functional criteria and insurance authorization.  Follow Up Recommendations  Skilled nursing-short term rehab (<3 hours/day) Can patient physically be transported by private vehicle: No   Assistance Recommended at Discharge Frequent or constant Supervision/Assistance  Patient can return home with the following A little help with walking and/or transfers;A little help with bathing/dressing/bathroom;Assistance with cooking/housework;Assist for transportation;Help with stairs or ramp for entrance   Equipment Recommendations  Rolling walker (2 wheels);Wheelchair (measurements PT);Wheelchair cushion (measurements PT);BSC/3in1    Recommendations  for Other Services       Precautions / Restrictions Precautions Precautions: Fall Restrictions Weight Bearing Restrictions: No     Mobility  Bed Mobility                    Transfers                        Ambulation/Gait                   Stairs             Wheelchair Mobility    Modified Rankin (Stroke Patients Only)       Balance                                            Cognition Arousal/Alertness: Awake/alert Behavior During Therapy: WFL for tasks assessed/performed Overall Cognitive Status: Within Functional Limits for tasks assessed                                          Exercises General Exercises - Lower Extremity Ankle Circles/Pumps: AROM, Strengthening, Both, 10 reps, Supine Quad Sets: AROM, Strengthening, Both, 10 reps, Supine Gluteal Sets: AROM, Strengthening, Both, 10 reps, Supine Short Arc Quad: AROM, Strengthening, Both, 10 reps, Supine Heel Slides: AROM, Strengthening, Both, 10 reps, Supine Hip ABduction/ADduction: AROM, Strengthening, Both, 10 reps, Supine Straight Leg Raises: AROM, Strengthening, Both, 10 reps, Supine    General Comments  Nursing cleared pt for  participation in physical therapy.  Pt agreeable to PT session.  Pt's wife present end of session.      Pertinent Vitals/Pain Pain Assessment Pain Assessment: No/denies pain HR WFL during sessions activities.    Home Living                          Prior Function            PT Goals (current goals can now be found in the care plan section) Acute Rehab PT Goals Patient Stated Goal: improve breathing PT Goal Formulation: With patient Time For Goal Achievement: 08/30/22 Potential to Achieve Goals: Fair Progress towards PT goals: Progressing toward goals    Frequency    Min 2X/week      PT Plan Current plan remains appropriate    Co-evaluation              AM-PAC PT "6  Clicks" Mobility   Outcome Measure  Help needed turning from your back to your side while in a flat bed without using bedrails?: None Help needed moving from lying on your back to sitting on the side of a flat bed without using bedrails?: None Help needed moving to and from a bed to a chair (including a wheelchair)?: A Little Help needed standing up from a chair using your arms (e.g., wheelchair or bedside chair)?: A Little Help needed to walk in hospital room?: A Little Help needed climbing 3-5 steps with a railing? : A Lot 6 Click Score: 19    End of Session Equipment Utilized During Treatment: Oxygen (3 L via nasal cannula) Activity Tolerance: Other (comment) (SOB noted with ex's) Patient left: in bed;with call bell/phone within reach;with bed alarm set;with family/visitor present;Other (comment) (B heels floating via pillow support) Nurse Communication: Mobility status;Precautions PT Visit Diagnosis: Other abnormalities of gait and mobility (R26.89);Muscle weakness (generalized) (M62.81)     Time: 2409-7353 PT Time Calculation (min) (ACUTE ONLY): 23 min  Charges:  $Therapeutic Exercise: 23-37 mins                     Leitha Bleak, PT 08/17/22, 5:10 PM

## 2022-08-18 ENCOUNTER — Inpatient Hospital Stay: Payer: Medicare Other

## 2022-08-18 DIAGNOSIS — J205 Acute bronchitis due to respiratory syncytial virus: Secondary | ICD-10-CM | POA: Diagnosis not present

## 2022-08-18 MED ORDER — DICLOFENAC SODIUM 1 % EX GEL
2.0000 g | Freq: Four times a day (QID) | CUTANEOUS | Status: DC
  Administered 2022-08-18 – 2022-08-19 (×5): 2 g via TOPICAL
  Filled 2022-08-18: qty 100

## 2022-08-18 NOTE — Plan of Care (Signed)

## 2022-08-18 NOTE — TOC Progression Note (Signed)
Transition of Care (TOC) - Progression Note    Patient Details  Name: [name] [name]. MRN: [id number] Date of Birth: 19-Dec-1940  Transition of Care [facility]) CM/SW Contact  Valente David, RN Phone Number: 08/18/2022, 12:19 PM  Clinical Narrative:     Per MD, patient may be medically ready for discharge tomorrow.  Call placed to Seth Bake at [facility], confirmed that patient will be able to come to facility tomorrow if medically stable and has discharge orders.   Expected Discharge Plan: [place] Barriers to Discharge: Continued Medical Work up  Expected Discharge Plan and Services       Living arrangements for the past 2 months: [place]                                       Social Determinants of Health (SDOH) Interventions SDOH Screenings   Food Insecurity: No Food Insecurity (08/13/2022)  Housing: Low Risk  (08/13/2022)  Transportation Needs: No Transportation Needs (08/13/2022)  Utilities: Not At Risk (08/13/2022)  Depression (PHQ2-9): Low Risk  (07/05/2022)  Tobacco Use: Medium Risk (08/13/2022)    Readmission Risk Interventions     No data to display

## 2022-08-18 NOTE — Progress Notes (Signed)
PULMONOLOGY         Date: 08/18/2022,   MRN# [id number] [name] [name]. 07-Apr-1941     AdmissionWeight: 72.6 kg (recorded 06/2022)                 CurrentWeight: 72.6 kg (recorded 06/2022)  Referring provider: Dr. Dwyane Dee   CHIEF COMPLAINT:   Acute on chronic hypoxemic respiratory failure   HISTORY OF PRESENT ILLNESS   82 year old male with a history of dyslipidemia, alcohol abuse, essential hypertension BPH, advanced COPD with chronic hypoxemia on 3 L of oxygen via nasal cannula comes in with flulike illness and cough x 3 days with worsening shortness of breath.  On admission to the ER he also had atrial fibrillation with rapid ventricular response.  He was placed on BiPAP found to be positive for RSV.  He is hospitalized with findings of acute exacerbation of COPD and is having difficulty weaning off of oxygen with progressive dyspnea.  Cardiology is on board and has evaluated patient today.  During my evaluation patient did have an episode of not massive hemoptysis mixed in with thick and inspissated phlegm.  I reviewed his CT chest with findings of centrilobular emphysema and mild bilateral groundglass attenuation consistent with viral pneumonia.  08/18/22- patient shares he still has dyspnea.  He shares he has constipation which is causing discomfort.  He reports inability to participate with PT due to dyspnea. He asks for cream for painful toe. He walked only few steps.  Please continue prednisone '40mg'$  daily and zithromax  PAST MEDICAL HISTORY   Past Medical History:  Diagnosis Date   Abdominal discomfort 12/07/2020   Acquired trigger finger of right middle finger 02/04/2020   Acute prostatitis 04/08/2018   Alcohol abuse    Alcohol dependence ([place]) 12/07/2020   Anal or rectal pain 04/08/2018   Annual physical exam 12/07/2020   Anorectal disorder 12/07/2020   Arthritis    Atherosclerotic heart disease of native coronary artery without angina pectoris 04/08/2018    Benign prostatic hyperplasia with lower urinary tract symptoms 12/07/2020   BPH with elevated PSA    Cancer ([place])    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   Chronic obstructive pulmonary disease with (acute) exacerbation ([place]) 12/07/2020   Chronic respiratory failure with hypoxia ([place]) 08/10/2019   Chronic sinusitis 12/07/2020   Cigarette nicotine dependence, uncomplicated 75/91/6384   Coagulation disorder ([place]) 01/13/2019   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   Contracture of palmar fascia 12/07/2020   COPD (chronic obstructive pulmonary disease) ([place])    Corn of toe 12/07/2020   Corns and callosities 04/09/2018   Coronary artery disease    2019 with stents   Coronary atherosclerosis 05/07/2018   Degenerative disc disease, 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   Degenerative disc disease, 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   Diarrhea 04/09/2018   Double vision with both eyes open 12/07/2020   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   Dysphagia 08/14/2018   Dyspnea    ED (erectile dysfunction)    ED (erectile dysfunction) of organic origin 12/07/2020   Elevated prostate specific antigen (PSA) 04/08/2018   Elevated PSA    Encounter for screening for other disorder 12/07/2020   Enlarged prostate without lower urinary tract symptoms (luts) 04/09/2018   Enthesopathy 12/07/2020   Essential (primary) hypertension 04/08/2018   Ex-smoker 04/09/2018   Exertional dyspnea 04/02/2018   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   Flatulence 04/08/2018   Gastro-esophageal reflux disease without esophagitis 04/08/2018  GERD (gastroesophageal reflux disease)    History of acute otitis externa 08/14/2018   Hyperlipidemia    Hypertension    Hypoxemia 12/07/2020   Kidney cysts    Left knee pain 12/07/2020   Low back pain 04/08/2018   Male erectile dysfunction 04/09/2018   Mixed hyperlipidemia 04/08/2018   Nocturia more than twice per night 04/09/2018   Osteoarthritis of  first carpometacarpal joint 04/08/2018   Osteoarthritis of knee 12/07/2020   Other long term (current) drug therapy 12/07/2020   Pain due to onychomycosis of toenail of left foot 07/20/2019   Pain in joint 04/09/2018   Pain in knee 04/09/2018   Pain in unspecified knee 12/07/2020   Pain of finger 04/09/2018   Palpitations    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[place]) 12/07/2020   Personal history of colonic polyps 12/07/2020   Pneumonia    Pneumothorax 04/12/2020   Polypharmacy 04/09/2018   Porokeratosis 01/13/2019   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[place])    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/07/2020   Pulmonary nodules 06/08/2016   Pure hypercholesterolemia 12/07/2020   Renal cyst 12/07/2020   Sleep disorder 04/08/2018   Status post bronchoscopy 04/12/2020   Tear of rotator cuff 04/09/2018   Tobacco user 15/11/6977   Uncomplicated alcohol dependence ([place]) 04/09/2018   Unspecified rotator cuff tear or rupture of unspecified shoulder, not specified as traumatic 12/07/2020   Unspecified tear of unspecified meniscus, current injury, unspecified knee, initial encounter 12/07/2020   Visual disturbance 12/07/2020   Vitamin D deficiency 04/08/2018     SURGICAL HISTORY   Past Surgical History:  Procedure Laterality Date   BRONCHIAL BIOPSY  10/13/2019   Procedure: BRONCHIAL BIOPSIES;  Surgeon: Collene Gobble, MD;  Location: [place];  Service: Pulmonary;;   BRONCHIAL BIOPSY  04/12/2020   Procedure: BRONCHIAL BIOPSIES;  Surgeon: Collene Gobble, MD;  Location: [place];  Service: Pulmonary;;   BRONCHIAL BRUSHINGS  10/13/2019   Procedure: BRONCHIAL BRUSHINGS;  Surgeon: Collene Gobble, MD;  Location: [facility] ENDOSCOPY;  Service: Pulmonary;;   BRONCHIAL BRUSHINGS  04/12/2020   Procedure: BRONCHIAL BRUSHINGS;  Surgeon: Collene Gobble, MD;  Location[facility]COPY;  Service: Pulmonary;;   BRONCHIAL NEEDLE ASPIRATION BIOPSY  10/13/2019   Procedure: BRONCHIAL NEEDLE ASPIRATION BIOPSIES;  Surgeon: Collene Gobble, MD;   Location: [facility] ENDOSCOPY;  Service: Pulmonary;;   BRONCHIAL NEEDLE ASPIRATION BIOPSY  04/12/2020   Procedure: BRONCHIAL NEEDLE ASPIRATION BIOPSIES;  Surgeon: Collene Gobble, MD;  Location[facility] ENDOSCOPY;  Service: Pulmonary;;   BRONCHIAL WASHINGS  10/13/2019   Procedure: BRONCHIAL WASHINGS;  Surgeon: Collene Gobble, MD;  Location: [facility] ENDOSCOPY;  Service: Pulmonary;;   BRONCHIAL WASHINGS  04/12/2020   Procedure: BRONCHIAL WASHINGS;  Surgeon: Collene Gobble, MD;  Location: [place] ENDOSCOPY;  Service: Pulmonary;;   CARDIAC CATHETERIZATION  2002   50% RCA   CATARACT EXTRACTION, BILATERAL     CORONARY STENT INTERVENTION N/A 04/15/2018   Procedure: CORONARY STENT INTERVENTION;  Surgeon: Jettie Booze, MD;  Location: [place] CV LAB;  Service: Cardiovascular;  Laterality: N/A;  om1   EYE SURGERY     KNEE ARTHROSCOPY     LEFT HEART CATH AND CORONARY ANGIOGRAPHY N/A 04/15/2018   Procedure: LEFT HEART CATH AND CORONARY ANGIOGRAPHY;  Surgeon: Jettie Booze, MD;  Location: [place] CV LAB;  Service: Cardiovascular;  Laterality: N/A;   MULTIPLE TOOTH EXTRACTIONS     TONSILLECTOMY     VIDEO BRONCHOSCOPY  04/12/2020   VIDEO BRONCHOSCOPY WITH ENDOBRONCHIAL NAVIGATION N/A 07/26/2016  Procedure: VIDEO BRONCHOSCOPY WITH ENDOBRONCHIAL NAVIGATION;  Surgeon: Collene Gobble, MD;  Location: [place];  Service: Thoracic;  Laterality: N/A;   VIDEO BRONCHOSCOPY WITH ENDOBRONCHIAL NAVIGATION N/A 10/13/2019   Procedure: [facility] AND VIDEO BRONCHOSCOPY WITH ENDOBRONCHIAL NAVIGATION;  Surgeon: Collene Gobble, MD;  Location: [place] ENDOSCOPY;  Service: Pulmonary;  Laterality: N/A;   VIDEO BRONCHOSCOPY WITH ENDOBRONCHIAL NAVIGATION N/A 04/12/2020   Procedure: VIDEO BRONCHOSCOPY WITH ENDOBRONCHIAL NAVIGATION;  Surgeon: Collene Gobble, MD;  Location: [place] ENDOSCOPY;  Service: Pulmonary;  Laterality: N/A;     FAMILY HISTORY   Family History  Problem Relation Age of Onset   Hypertension Mother    Alzheimer's disease  Mother      SOCIAL HISTORY   Social History   Tobacco Use   Smoking status: Former    Packs/day: 0.75    Years: 67.00    Total pack years: 50.25    Types: Cigarettes    Start date: 52    Quit date: 07/13/2020    Years since quitting: 2.0    Passive exposure: Never   Smokeless tobacco: Never   Tobacco comments:    Recent Quit    Vaping Use   Vaping Use: Former  Substance Use Topics   Alcohol use: No    Alcohol/week: 0.0 standard drinks of alcohol    Comment: quit drinking 2014   Drug use: No     MEDICATIONS    Home Medication:    Current Medication:  Current Facility-Administered Medications:    acetaminophen (TYLENOL) tablet 650 mg, 650 mg, Oral, Q6H PRN, Ivor Costa, MD, 650 mg at 08/16/22 2123   albuterol (PROVENTIL) (2.5 MG/3ML) 0.083% nebulizer solution 2.5 mg, 2.5 mg, Nebulization, Q4H PRN, Ivor Costa, MD, 2.5 mg at 08/17/22 0521   apixaban (ELIQUIS) tablet 5 mg, 5 mg, Oral, BID, Emeterio Reeve, DO, 5 mg at 08/18/22 2500   azithromycin (ZITHROMAX) tablet 250 mg, 250 mg, Oral, Daily, Lanney Gins, Danford Tat, MD, 250 mg at 08/18/22 3704   clonazePAM (KLONOPIN) tablet 0.5 mg, 0.5 mg, Oral, Daily PRN, Ivor Costa, MD, 0.5 mg at 08/18/22 1429   dextromethorphan-guaiFENesin ([place] DM) 30-600 MG per 12 hr tablet 1 tablet, 1 tablet, Oral, BID PRN, Ivor Costa, MD, 1 tablet at 08/17/22 1610   diltiazem (CARDIZEM CD) 24 hr capsule 180 mg, 180 mg, Oral, BID, Dunn, Ryan M, PA-C, 180 mg at 08/18/22 8889   finasteride (PROSCAR) tablet 5 mg, 5 mg, Oral, Daily, Ivor Costa, MD, 5 mg at 08/18/22 0916   gabapentin (NEURONTIN) capsule 100 mg, 100 mg, Oral, BID, Ivor Costa, MD, 100 mg at 08/18/22 0916   levalbuterol (XOPENEX) nebulizer solution 0.63 mg, 0.63 mg, Nebulization, Q6H, Ivor Costa, MD, 0.63 mg at 08/18/22 1319   melatonin tablet 5 mg, 5 mg, Oral, QHS, Niu, Soledad Gerlach, MD, 5 mg at 08/17/22 2116   nitroGLYCERIN (NITROSTAT) SL tablet 0.4 mg, 0.4 mg, Sublingual, Q5 min PRN, Ivor Costa, MD   ondansetron (ZOFRAN) injection 4 mg, 4 mg, Intravenous, Q8H PRN, Ivor Costa, MD   pantoprazole (PROTONIX) EC tablet 40 mg, 40 mg, Oral, Daily, Ivor Costa, MD, 40 mg at 08/18/22 0916   polyethylene glycol (MIRALAX / GLYCOLAX) packet 17 g, 17 g, Oral, Daily, Shawna Clamp, MD, 17 g at 08/18/22 0916   predniSONE (DELTASONE) tablet 40 mg, 40 mg, Oral, Q breakfast, Shawna Clamp, MD, 40 mg at 08/18/22 0916   rosuvastatin (CRESTOR) tablet 20 mg, 20 mg, Oral, Daily, Ivor Costa, MD, 20 mg at 08/18/22 [phone]  tamsulosin (FLOMAX) capsule 0.4 mg, 0.4 mg, Oral, Daily, Ivor Costa, MD, 0.4 mg at 08/18/22 7106    ALLERGIES   Flagyl [metronidazole]     REVIEW OF SYSTEMS    Review of Systems:  Gen:  Denies  fever, sweats, chills weigh loss  HEENT: Denies blurred vision, double vision, ear pain, eye pain, hearing loss, nose bleeds, sore throat Cardiac:  No dizziness, chest pain or heaviness, chest tightness,edema Resp:   reports dyspnea chronically  Gi: Denies swallowing difficulty, stomach pain, nausea or vomiting, diarrhea, constipation, bowel incontinence Gu:  Denies bladder incontinence, burning urine Ext:   Denies Joint pain, stiffness or swelling Skin: Denies  skin rash, easy bruising or bleeding or hives Endoc:  Denies polyuria, polydipsia , polyphagia or weight change Psych:   Denies depression, insomnia or hallucinations   Other:  All other systems negative   VS: BP 128/61 (BP Location: Left Arm)   Pulse 88   Temp 97.7 F (36.5 C)   Resp 18   Wt 72.6 kg Comment: recorded 06/2022  SpO2 97%   BMI 22.64 kg/m      PHYSICAL EXAM    GENERAL:NAD, no fevers, chills, no weakness no fatigue HEAD: Normocephalic, atraumatic.  EYES: Pupils equal, round, reactive to light. Extraocular muscles intact. No scleral icterus.  MOUTH: Moist mucosal membrane. Dentition intact. No abscess noted.  EAR, NOSE, THROAT: Clear without exudates. No external lesions.  NECK: Supple. No  thyromegaly. No nodules. No JVD.  PULMONARY: decreased breath sounds with mild rhonchi worse at bases bilaterally.  CARDIOVASCULAR: S1 and S2. Regular rate and rhythm. No murmurs, rubs, or gallops. No edema. Pedal pulses 2+ bilaterally.  GASTROINTESTINAL: Soft, nontender, nondistended. No masses. Positive bowel sounds. No hepatosplenomegaly.  MUSCULOSKELETAL: No swelling, clubbing, or edema. Range of motion full in all extremities.  NEUROLOGIC: Cranial nerves II through XII are intact. No gross focal neurological deficits. Sensation intact. Reflexes intact.  SKIN: No ulceration, lesions, rashes, or cyanosis. Skin warm and dry. Turgor intact.  PSYCHIATRIC: Mood, affect within normal limits. The patient is awake, alert and oriented x 3. Insight, judgment intact.       IMAGING     ASSESSMENT/PLAN   Severe acute exacerbation of COPD -Present on admission due to viral RSV infection -Continue nebulizer therapy with Xopenex, will DC ipratropium due to arrhythmogenic potential,  -Have initiated Zithromax p.o. 250 mg, continue with prednisone currently at 40 mg. -Incentive spirometry and flutter valve, patient is using these devices during my eval   Non-massive hemoptysis -This is likely due to anticoagulation and is not clinically significant at this time I would not recommend to DC anticoagulation at this time and would monitor only in hopes of resolution post treatment of underlying COPD exacerbation   Persistent atrial fibrillation/flutter with rapid ventricular response -This is certainly contributing to dyspnea and shortness of breath and should improve post cardiac therapy.  Cardiology team is on board-appreciate input  Thank you for allowing me to participate in the care of this patient.   Patient/Family are satisfied with care plan and all questions have been answered.    Provider disclosure: Patient with at least one acute or chronic illness or injury that poses a threat to  life or bodily function and is being managed actively during this encounter.  All of the below services have been performed independently by signing provider:  review of prior documentation from internal and or external health records.  Review of previous and current lab results.  Interview and  comprehensive assessment during patient visit today. Review of current and previous chest radiographs/CT scans. Discussion of management and test interpretation with health care team and patient/family.   This document was prepared using Dragon voice recognition software and may include unintentional dictation errors.     Ottie Glazier, M.D.  Division of Pulmonary & Critical Care Medicine

## 2022-08-18 NOTE — Progress Notes (Signed)
PROGRESS NOTE    [name]  OZH:[id number] DOB: 04-Nov-1940 DOA: 08/12/2022  PCP: [name], MD   Brief Narrative:  This 82 y.o. 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  01/14: Patient was found to have severe respiratory distress and severe tachycardia with heart rates up to 180s.  Found to have new onset atrial fibrillation.  He cannot speak in full sentence, using accessory muscle for breathing, initially started on CPAP but still has respiratory distress.  BiPAP is started in the ED. (+)PCR for RSV, BNP 61, lactic acid normal 1.7, WBC 13.6, GFR> 60, blood pressure 130/59, RR 28.  ABG with pH 7.32, CO2 68, O2 43.  Chest x-ray showed COPD without infiltration.  Patient is admitted to stepdown as inpatient. Later in the afternoon was transitioned to [place] O2 2L/min 01/15: maintaining on [place] O2 2-4L. HR improved. Cardizem gtt d/c overnight d/t bradycardia. Afib --> Aflutter. Remains on heparin gtt. Cardiology [facility]) saw patient - "If remains symptomatic with shortness of breath and palpitations can consider TEE/DCCV"  01/16: remains significantly SOB, titrating rate control meds. 01/17: Heart rate is well-controlled.  Remains in atrial flutter.  Plan for DC cardioversion as an outpatient after acute illness.  Assessment & Plan:   Principal Problem:   RSV bronchitis Active Problems:   Acute on chronic respiratory failure with hypoxia (HCC)   COPD exacerbation (HCC)   Atrial fibrillation with RVR (HCC)   Coronary artery disease   Myocardial injury   Hypertension   Hyperlipidemia   BPH (benign prostatic hyperplasia)   Anxiety   Atrial flutter (HCC)  Acute on chronic hypoxic respiratory failure, in the setting of RSV infection: RSV induced COPD exacerbation: Patient was found to be hypoxic requiring BiPAP on  arrival.  Now weaned down to [place] Continue nebulized bronchodilators. Initiated on Solu-Medrol 40 mg IV bid, then transitioned to prednisone 40 mg daily Continue Mucinex for cough. Incentive spirometry. Continue Supplemental O2 - he is usually on 2-4L/min at home . States he is at at baseline, still significant SOB, still has hypoxia while ambulation. Pulmonology consulted for evaluation. Continued to remain with significant dyspnea while ambulation.   Persistent atrial fibrillation with RVR: Heart rate is now well-controlled, remains in A.Fibrillation. CHADS2 score is 4.  Started on heparin infusion. Patient had 2D echo 08/06/2022 which showed LVEF of 65 to 70% Cardizem drip discontinued. Continue Cardizem 180 mg twice daily Continue IV metoprolol 5 mg every 2 hours for heart rate> 125 IV heparin transitioned with Eliquis. Plan for cardioversion as outpatient after acute illness resolves. Cardiology signed off.   Mild volume overload: Resolved with 1 dose of IV Lasix.   Coronary artery disease.  ACS ruled out. Trend troponin --> flat  Continue Crestor D/c Plavix now that on Eliquis Discontinue aspirin.  Continue as needed nitroglycerin.   Hypertension: Continue IV hydralazine as needed. Hold Diovan , Continue Cardizem   Hyperlipidemia Continue Crestor.   BPH (benign prostatic hyperplasia) Continue Proscar and Flomax.   Anxiety Continue As needed Klonopin.  DVT prophylaxis: Eliquis. Code Status:Full code. Family Communication: No family at bed side. Disposition Plan:   Status is: Inpatient Remains inpatient appropriate because: Admitted for new onset A-fib requiring Cardizem infusion.  Cardiology consulted, medication adjusted.  Recommended cardioversion once acute event resolved.   Consultants:  Cardiology  Procedures:  Antimicrobials:  Anti-infectives (From admission, onward)    Start     Dose/Rate Route Frequency Ordered Stop   08/17/22 1315  azithromycin  (ZITHROMAX) tablet 250 mg        250 mg Oral Daily 08/17/22 1227        Subjective: Patient seen and examined at bedside.  Overnight events noted. Patient reports doing better, denies any chest pain, states he is at baseline. He still reports having congestion, cough and shortness of breath while ambulation.  Objective: Vitals:   08/18/22 0159 08/18/22 0747 08/18/22 0838 08/18/22 1157  BP: 128/65 (!) 142/69  128/61  Pulse: 85 86  88  Resp: '18 18  18  '$ Temp: 97.6 F (36.4 C) (!) 97.5 F (36.4 C)  97.7 F (36.5 C)  TempSrc:      SpO2: 99% 100% 99% 100%  Weight:        Intake/Output Summary (Last 24 hours) at 08/18/2022 1219 Last data filed at 08/18/2022 0747 Gross per 24 hour  Intake --  Output 650 ml  Net -650 ml   Filed Weights   08/12/22 1432  Weight: 72.6 kg   Examination:  General exam: Appears deconditioned, comfortable, not in any acute distress. Respiratory system: CTA bilaterally, respiratory effort normal, RR 14. Cardiovascular system: S1-S2 heard, Irregular rhythm, no murmur. Gastrointestinal system: Abdomen is soft, non tender, non distended, BS+ Central nervous system: Alert and oriented x 3. No focal neurological deficits. Extremities: No edema, no cyanosis, no clubbing. Skin: No rashes, lesions or ulcers Psychiatry: Judgement and insight appear normal. Mood & affect appropriate.     Data Reviewed: I have personally reviewed following labs and imaging studies  CBC: Recent Labs  Lab 08/12/22 1224 08/13/22 0303 08/14/22 0501 08/16/22 0146  WBC 13.6* 8.1 11.7* 10.8*  NEUTROABS 10.6*  --   --   --   HGB 12.5* 11.0* 10.9* 11.0*  HCT 39.7 34.9* 33.4* 33.4*  MCV 98.8 98.6 95.7 94.9  PLT 317 232 255 332   Basic Metabolic Panel: Recent Labs  Lab 08/12/22 1224 08/13/22 0303 08/14/22 0501 08/15/22 0514 08/16/22 0146  NA 140 137 136 135 136  K 3.8 4.7 4.4 4.4 4.8  CL 101 102 100 99 99  CO2 '31 27 28 29 '$ 32  GLUCOSE 100* 169* 156* 144* 153*  BUN  25* 27* 35* 41* 47*  CREATININE 1.21 1.14 1.11 1.04 1.10  CALCIUM 8.7* 8.1* 8.3* 8.3* 8.4*  MG 2.0  --   --   --  2.4  PHOS  --   --   --   --  4.1   GFR: Estimated Creatinine Clearance: 54.1 mL/min (by C-G formula based on SCr of 1.1 mg/dL). Liver Function Tests: Recent Labs  Lab 08/12/22 1224  AST 22  ALT 14  ALKPHOS 50  BILITOT 0.9  PROT 6.4*  ALBUMIN 3.6   No results for input(s): "LIPASE", "AMYLASE" in the last 168 hours. No results for input(s): "AMMONIA" in the last 168 hours. Coagulation Profile: Recent Labs  Lab 08/12/22 1606  INR 1.1   Cardiac Enzymes: No results for input(s): "CKTOTAL", "CKMB", "CKMBINDEX", "TROPONINI" in the last 168 hours. BNP (last 3 results) Recent Labs    05/30/22 1106  PROBNP 34.0   HbA1C: No results for input(s): "HGBA1C" in the last 72 hours.  CBG: No results for input(s): "GLUCAP" in the last 168 hours. Lipid Profile: No results for input(s): "CHOL", "HDL", "LDLCALC", "TRIG", "CHOLHDL", "LDLDIRECT" in the last 72 hours.  Thyroid  Function Tests: No results for input(s): "TSH", "T4TOTAL", "FREET4", "T3FREE", "THYROIDAB" in the last 72 hours. Anemia Panel: No results for input(s): "VITAMINB12", "FOLATE", "FERRITIN", "TIBC", "IRON", "RETICCTPCT" in the last 72 hours. Sepsis Labs: Recent Labs  Lab 08/12/22 1227 08/12/22 1438  LATICACIDVEN 1.7 1.4    Recent Results (from the past 240 hour(s))  Resp panel by RT-PCR (RSV, Flu A&B, Covid) Anterior Nasal Swab     Status: Abnormal   Collection Time: 08/12/22 12:27 PM   Specimen: Anterior Nasal Swab  Result Value Ref Range Status   SARS Coronavirus 2 by RT PCR NEGATIVE NEGATIVE Final    Comment: (NOTE) SARS-CoV-2 target nucleic acids are NOT DETECTED.  The SARS-CoV-2 RNA is generally detectable in upper respiratory specimens during the acute phase of infection. The lowest concentration of SARS-CoV-2 viral copies this assay can detect is 138 copies/mL. A negative result does  not preclude SARS-Cov-2 infection and should not be used as the sole basis for treatment or other patient management decisions. A negative result may occur with  improper specimen collection/handling, submission of specimen other than nasopharyngeal swab, presence of viral mutation(s) within the areas targeted by this assay, and inadequate number of viral copies(<138 copies/mL). A negative result must be combined with clinical observations, patient history, and epidemiological information. The expected result is Negative.  Fact Sheet for Patients:  EntrepreneurPulse.com.au  Fact Sheet for Healthcare Providers:  IncredibleEmployment.be  This test is no t yet approved or cleared by the [place] FDA and  has been authorized for detection and/or diagnosis of SARS-CoV-2 by FDA under an Emergency Use Authorization (EUA). This EUA will remain  in effect (meaning this test can be used) for the duration of the COVID-19 declaration under Section 564(b)(1) of the Act, 21 U.S.C.section 360bbb-3(b)(1), unless the authorization is terminated  or revoked sooner.       Influenza A by PCR NEGATIVE NEGATIVE Final   Influenza B by PCR NEGATIVE NEGATIVE Final    Comment: (NOTE) The Xpert Xpress SARS-CoV-2/FLU/RSV plus assay is intended as an aid in the diagnosis of influenza from Nasopharyngeal swab specimens and should not be used as a sole basis for treatment. Nasal washings and aspirates are unacceptable for Xpert Xpress SARS-CoV-2/FLU/RSV testing.  Fact Sheet for Patients: EntrepreneurPulse.com.au  Fact Sheet for Healthcare Providers: IncredibleEmployment.be  This test is not yet approved or cleared by the [place] FDA and has been authorized for detection and/or diagnosis of SARS-CoV-2 by FDA under an Emergency Use Authorization (EUA). This EUA will remain in effect (meaning this test can be used) for the  duration of the COVID-19 declaration under Section 564(b)(1) of the Act, 21 U.S.C. section 360bbb-3(b)(1), unless the authorization is terminated or revoked.     Resp Syncytial Virus by PCR POSITIVE (A) NEGATIVE Final    Comment: (NOTE) Fact Sheet for Patients: EntrepreneurPulse.com.au  Fact Sheet for Healthcare Providers: IncredibleEmployment.be  This test is not yet approved or cleared by the [place] FDA and has been authorized for detection and/or diagnosis of SARS-CoV-2 by FDA under an Emergency Use Authorization (EUA). This EUA will remain in effect (meaning this test can be used) for the duration of the COVID-19 declaration under Section 564(b)(1) of the Act, 21 U.S.C. section 360bbb-3(b)(1), unless the authorization is terminated or revoked.  Performed at [facility] [address] [place], [place] [address]   Expectorated Sputum Assessment w Gram Stain, Rflx to Resp Cult     Status: None   Collection Time: 08/12/22  4:39 PM   Specimen: Sputum  Result Value Ref Range Status   Specimen Description SPUTUM  Final   Special Requests NONE  Final   Sputum evaluation   Final    Sputum specimen not acceptable for testing.  Please recollect.   NOTIFIED ASHLEY TREXLER ON 08/12/22 AT 1735 QSD Performed at [facility], [address] [place], [place] [address]    Report Status 08/12/2022 FINAL  Final  Expectorated Sputum Assessment w Gram Stain, Rflx to Resp Cult     Status: None   Collection Time: 08/13/22 10:52 AM   Specimen: Sputum  Result Value Ref Range Status   Specimen Description SPU  Final   Special Requests SPU  Final   Sputum evaluation   Final    THIS SPECIMEN IS ACCEPTABLE FOR SPUTUM CULTURE Performed at [facility], [address] [place], [place] [address]    Report Status 08/13/2022 FINAL  Final  Culture, Respiratory w Gram Stain     Status: None   Collection Time: 08/13/22 10:52  AM   Specimen: Sputum  Result Value Ref Range Status   Specimen Description   Final    SPU Performed at [facility], [address] [place], [place] [address]    Special Requests   Final    SPU Reflexed from [id number] Performed at [facility], [address] [place], [place] [address]    Gram Stain   Final    RARE WBC PRESENT, PREDOMINANTLY PMN MODERATE GRAM POSITIVE COCCI IN CHAINS FEW GRAM NEGATIVE RODS FEW YEAST PREVIOUSLY REPORTED AS: MODERATE GRAM NEGATIVE RODS MODERATE WBC PRESENT, PREDOMINANTLY PMN CORRECTED RESULTS CALLED TO:  C/  A. DAVIS, RN 08/13/22 2023 A. LAFRANCE    Culture   Final    Normal respiratory flora-no Staph aureus or Pseudomonas seen Performed at [place] [address], [place], [place] [address]    Report Status 08/16/2022 FINAL  Final    Radiology Studies: DG Chest Port 1 View  Result Date: 08/17/2022 CLINICAL DATA:  Shortness of breath starting this morning. History of COPD. EXAM: PORTABLE CHEST 1 VIEW COMPARISON:  08/12/2022 and CT chest 03/07/2022 FINDINGS: Tapering of the peripheral pulmonary vasculature favors emphysema. Heart size within normal limits. Stable scarring in the right mid lung. No edema observed. Mild chronic interstitial accentuation. Airway thickening is present, suggesting bronchitis or reactive airways disease. Bilateral degenerative AC joint spurring. IMPRESSION: 1. Airway thickening is present, suggesting bronchitis or reactive airways disease. 2. Stable scarring in the right mid lung. 3. Emphysema. 4. Mild chronic interstitial accentuation. Electronically Signed   By: Van Clines M.D.   On: 08/17/2022 10:10    Scheduled Meds:  apixaban  5 mg Oral BID   azithromycin  250 mg Oral Daily   diltiazem  180 mg Oral BID   finasteride  5 mg Oral Daily   gabapentin  100 mg Oral BID   ipratropium  0.5 mg Nebulization Q6H   levalbuterol  0.63 mg Nebulization Q6H   melatonin  5 mg Oral QHS    pantoprazole  40 mg Oral Daily   polyethylene glycol  17 g Oral Daily   predniSONE  40 mg Oral Q breakfast   rosuvastatin  20 mg Oral Daily   tamsulosin  0.4 mg Oral Daily   Continuous Infusions:   LOS: 6 days    Time spent: 35 mins    Chistina Roston, MD [facility]   If 7PM-7AM, please contact night-coverage

## 2022-08-19 DIAGNOSIS — J205 Acute bronchitis due to respiratory syncytial virus: Secondary | ICD-10-CM | POA: Diagnosis not present

## 2022-08-19 LAB — CBC
HCT: 35.4 % — ABNORMAL LOW (ref 39.0–52.0)
Hemoglobin: 11.4 g/dL — ABNORMAL LOW (ref 13.0–17.0)
MCH: 31.1 pg (ref 26.0–34.0)
MCHC: 32.2 g/dL (ref 30.0–36.0)
MCV: 96.7 fL (ref 80.0–100.0)
Platelets: 230 10*3/uL (ref 150–400)
RBC: 3.66 MIL/uL — ABNORMAL LOW (ref 4.22–5.81)
RDW: 14.2 % (ref 11.5–15.5)
WBC: 13.2 10*3/uL — ABNORMAL HIGH (ref 4.0–10.5)
nRBC: 0 % (ref 0.0–0.2)

## 2022-08-19 LAB — BASIC METABOLIC PANEL
Anion gap: 2 — ABNORMAL LOW (ref 5–15)
BUN: 32 mg/dL — ABNORMAL HIGH (ref 8–23)
CO2: 36 mmol/L — ABNORMAL HIGH (ref 22–32)
Calcium: 8.3 mg/dL — ABNORMAL LOW (ref 8.9–10.3)
Chloride: 99 mmol/L (ref 98–111)
Creatinine, Ser: 0.89 mg/dL (ref 0.61–1.24)
GFR, Estimated: >60 mL/min (ref >60)
Glucose, Bld: 119 mg/dL — ABNORMAL HIGH (ref 70–99)
Potassium: 5.1 mmol/L (ref 3.5–5.1)
Sodium: 137 mmol/L (ref 135–145)

## 2022-08-19 MED ORDER — LEVALBUTEROL HCL 0.63 MG/3ML IN NEBU
0.6300 mg | INHALATION_SOLUTION | Freq: Three times a day (TID) | RESPIRATORY_TRACT | Status: DC
  Administered 2022-08-19: 0.63 mg via RESPIRATORY_TRACT
  Filled 2022-08-19 (×2): qty 3

## 2022-08-19 MED ORDER — LEVALBUTEROL HCL 0.63 MG/3ML IN NEBU
0.6300 mg | INHALATION_SOLUTION | Freq: Two times a day (BID) | RESPIRATORY_TRACT | Status: DC
  Administered 2022-08-19 – 2022-08-20 (×2): 0.63 mg via RESPIRATORY_TRACT
  Filled 2022-08-19 (×3): qty 3

## 2022-08-19 MED ORDER — SENNOSIDES-DOCUSATE SODIUM 8.6-50 MG PO TABS
1.0000 | ORAL_TABLET | Freq: Two times a day (BID) | ORAL | Status: DC
  Administered 2022-08-19 (×2): 1 via ORAL
  Filled 2022-08-19 (×2): qty 1

## 2022-08-19 MED ORDER — POLYETHYLENE GLYCOL 3350 17 G PO PACK: 17.0000 g | PACK | Freq: Every day | ORAL | Status: DC

## 2022-08-19 MED ORDER — PREDNISONE 20 MG PO TABS
35.0000 mg | ORAL_TABLET | Freq: Every day | ORAL | Status: DC
  Administered 2022-08-20: 35 mg via ORAL
  Filled 2022-08-19: qty 2

## 2022-08-19 NOTE — Progress Notes (Signed)
PROGRESS NOTE    [name] [name].  TIW:[id number] DOB: [birth year] DOA: 08/12/2022  PCP: [name], MD   Brief Narrative:  This 82 y.o. 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  01/14: Patient was found to have severe respiratory distress and severe tachycardia with heart rates up to 180s.  Found to have new onset atrial fibrillation.  He cannot speak in full sentence, using accessory muscle for breathing, initially started on CPAP but still has respiratory distress.  BiPAP is started in the ED. (+)PCR for RSV, BNP 61, lactic acid normal 1.7, WBC 13.6, GFR> 60, blood pressure 130/59, RR 28.  ABG with pH 7.32, CO2 68, O2 43.  Chest x-ray showed COPD without infiltration.  Patient is admitted to stepdown as inpatient. Later in the afternoon was transitioned to [place] O2 2L/min 01/15: maintaining on [place] O2 2-4L. HR improved. Cardizem gtt d/c overnight d/t bradycardia. Afib --> Aflutter. Remains on heparin gtt. Cardiology [facility]) saw patient - "If remains symptomatic with shortness of breath and palpitations can consider TEE/DCCV"  01/16: remains significantly SOB, titrating rate control meds. 01/17: Heart rate is well-controlled.  Remains in atrial flutter.  Plan for DC cardioversion as an outpatient after acute illness. 1/21: Evaluated by pulmonology started on Zithromax and other medications adjusted.  Patient reports feeling better.  Assessment & Plan:   Principal Problem:   RSV bronchitis Active Problems:   Acute on chronic respiratory failure with hypoxia (HCC)   COPD exacerbation (HCC)   Atrial fibrillation with RVR (HCC)   Coronary artery disease   Myocardial injury   Hypertension   Hyperlipidemia   BPH (benign prostatic hyperplasia)   Anxiety   Atrial flutter (HCC)  Acute on chronic hypoxic respiratory failure, in  the setting of RSV infection: RSV induced COPD exacerbation: Patient was found to be hypoxic requiring BiPAP on arrival.  Now weaned down to [place] Continue nebulized bronchodilators. Initiated on Solu-Medrol 40 mg IV bid, then transitioned to prednisone 40 mg daily Continue Mucinex for cough. Incentive spirometry. Continue Supplemental O2 - he is usually on 2-4L/min at home . He still significant SOB, still has hypoxia while ambulation. Pulmonology consulted.  Medication adjusted. Patient started on Zithromax for 5 days.   Persistent atrial fibrillation with RVR: Heart rate is now well-controlled, remains in A.Fibrillation. CHADS2 score is 4.  Started on heparin infusion. Patient had 2D echo 08/06/2022 which showed LVEF of 65 to 70% Cardizem drip discontinued. Continue Cardizem 180 mg twice daily Continue IV metoprolol 5 mg every 2 hours for heart rate> 125 IV heparin transitioned with Eliquis. Plan for cardioversion as outpatient after acute illness resolves. Cardiology signed off.   Mild volume overload: Resolved with 1 dose of IV Lasix.   Coronary artery disease.  ACS ruled out. Trend troponin --> flat  Continue Crestor D/c Plavix now that on Eliquis Discontinue aspirin.  Continue as needed nitroglycerin.   Hypertension: Continue IV hydralazine as needed. Hold Diovan , Continue Cardizem   Hyperlipidemia Continue Crestor.   BPH (benign prostatic hyperplasia) Continue Proscar and Flomax.   Anxiety Continue As needed Klonopin.  DVT prophylaxis: Eliquis. Code Status:Full code. Family Communication: No family at bed side. Disposition Plan:   Status is: Inpatient Remains inpatient appropriate because: Admitted for new onset A-fib requiring Cardizem infusion.  Cardiology consulted, medication adjusted.  Recommended cardioversion  once acute event resolved.   Consultants:  Cardiology  Procedures:  Antimicrobials:  Anti-infectives (From admission, onward)    Start      Dose/Rate Route Frequency Ordered Stop   08/17/22 1315  azithromycin (ZITHROMAX) tablet 250 mg        250 mg Oral Daily 08/17/22 1227        Subjective: Patient seen and examined at bedside. Overnight events noted. Patient reports he is slightly better than yesterday, still has shortness of breath while ambulation. He wants to have physical therapy before he can be discharged.  Objective: Vitals:   08/18/22 2130 08/18/22 2250 08/19/22 0827 08/19/22 1217  BP:  (!) 113/49 (!) 151/69 127/61  Pulse:  84 85 86  Resp:   18   Temp:   98 F (36.7 C) 98.2 F (36.8 C)  TempSrc:   Oral Oral  SpO2: 96% 100% 100% 100%  Weight:        Intake/Output Summary (Last 24 hours) at 08/19/2022 1242 Last data filed at 08/19/2022 0524 Gross per 24 hour  Intake --  Output 900 ml  Net -900 ml   Filed Weights   08/12/22 1432  Weight: 72.6 kg   Examination:  General exam: Appears comfortable, not in any acute distress. Respiratory system: Clear to auscultation bilaterally, respiratory effort normal, RR 13 Cardiovascular system: S1-S2 heard, irregular rhythm, no murmur. Gastrointestinal system: Abdomen is soft, non tender, non distended, BS+ Central nervous system: Alert and oriented x 3. No focal neurological deficits. Extremities: No edema, no cyanosis, no clubbing. Skin: No rashes, lesions or ulcers Psychiatry: Mood and affect appropriate.    Data Reviewed: I have personally reviewed following labs and imaging studies  CBC: Recent Labs  Lab 08/13/22 0303 08/14/22 0501 08/16/22 0146 08/19/22 0637  WBC 8.1 11.7* 10.8* 13.2*  HGB 11.0* 10.9* 11.0* 11.4*  HCT 34.9* 33.4* 33.4* 35.4*  MCV 98.6 95.7 94.9 96.7  PLT 232 255 260 458   Basic Metabolic Panel: Recent Labs  Lab 08/13/22 0303 08/14/22 0501 08/15/22 0514 08/16/22 0146 08/19/22 0637  NA 137 136 135 136 137  K 4.7 4.4 4.4 4.8 5.1  CL 102 100 99 99 99  CO2 '27 28 29 '$ 32 36*  GLUCOSE 169* 156* 144* 153* 119*  BUN 27* 35*  41* 47* 32*  CREATININE 1.14 1.11 1.04 1.10 0.89  CALCIUM 8.1* 8.3* 8.3* 8.4* 8.3*  MG  --   --   --  2.4  --   PHOS  --   --   --  4.1  --    GFR: Estimated Creatinine Clearance: 66.8 mL/min (by C-G formula based on SCr of 0.89 mg/dL). Liver Function Tests: No results for input(s): "AST", "ALT", "ALKPHOS", "BILITOT", "PROT", "ALBUMIN" in the last 168 hours.  No results for input(s): "LIPASE", "AMYLASE" in the last 168 hours. No results for input(s): "AMMONIA" in the last 168 hours. Coagulation Profile: Recent Labs  Lab 08/12/22 1606  INR 1.1   Cardiac Enzymes: No results for input(s): "CKTOTAL", "CKMB", "CKMBINDEX", "TROPONINI" in the last 168 hours. BNP (last 3 results) Recent Labs    05/30/22 1106  PROBNP 34.0   HbA1C: No results for input(s): "HGBA1C" in the last 72 hours.  CBG: No results for input(s): "GLUCAP" in the last 168 hours. Lipid Profile: No results for input(s): "CHOL", "HDL", "LDLCALC", "TRIG", "CHOLHDL", "LDLDIRECT" in the last 72 hours.  Thyroid Function Tests: No results for input(s): "TSH", "T4TOTAL", "FREET4", "T3FREE", "THYROIDAB" in the last 72 hours.  Anemia Panel: No results for input(s): "VITAMINB12", "FOLATE", "FERRITIN", "TIBC", "IRON", "RETICCTPCT" in the last 72 hours. Sepsis Labs: Recent Labs  Lab 08/12/22 1438  LATICACIDVEN 1.4    Recent Results (from the past 240 hour(s))  Resp panel by RT-PCR (RSV, Flu A&B, Covid) Anterior Nasal Swab     Status: Abnormal   Collection Time: 08/12/22 12:27 PM   Specimen: Anterior Nasal Swab  Result Value Ref Range Status   SARS Coronavirus 2 by RT PCR NEGATIVE NEGATIVE Final    Comment: (NOTE) SARS-CoV-2 target nucleic acids are NOT DETECTED.  The SARS-CoV-2 RNA is generally detectable in upper respiratory specimens during the acute phase of infection. The lowest concentration of SARS-CoV-2 viral copies this assay can detect is 138 copies/mL. A negative result does not preclude  SARS-Cov-2 infection and should not be used as the sole basis for treatment or other patient management decisions. A negative result may occur with  improper specimen collection/handling, submission of specimen other than nasopharyngeal swab, presence of viral mutation(s) within the areas targeted by this assay, and inadequate number of viral copies(<138 copies/mL). A negative result must be combined with clinical observations, patient history, and epidemiological information. The expected result is Negative.  Fact Sheet for Patients:  EntrepreneurPulse.com.au  Fact Sheet for Healthcare Providers:  IncredibleEmployment.be  This test is no t yet approved or cleared by the [place] FDA and  has been authorized for detection and/or diagnosis of SARS-CoV-2 by FDA under an Emergency Use Authorization (EUA). This EUA will remain  in effect (meaning this test can be used) for the duration of the COVID-19 declaration under Section 564(b)(1) of the Act, 21 U.S.C.section 360bbb-3(b)(1), unless the authorization is terminated  or revoked sooner.       Influenza A by PCR NEGATIVE NEGATIVE Final   Influenza B by PCR NEGATIVE NEGATIVE Final    Comment: (NOTE) The Xpert Xpress SARS-CoV-2/FLU/RSV plus assay is intended as an aid in the diagnosis of influenza from Nasopharyngeal swab specimens and should not be used as a sole basis for treatment. Nasal washings and aspirates are unacceptable for Xpert Xpress SARS-CoV-2/FLU/RSV testing.  Fact Sheet for Patients: EntrepreneurPulse.com.au  Fact Sheet for Healthcare Providers: IncredibleEmployment.be  This test is not yet approved or cleared by the [place] FDA and has been authorized for detection and/or diagnosis of SARS-CoV-2 by FDA under an Emergency Use Authorization (EUA). This EUA will remain in effect (meaning this test can be used) for the duration of  the COVID-19 declaration under Section 564(b)(1) of the Act, 21 U.S.C. section 360bbb-3(b)(1), unless the authorization is terminated or revoked.     Resp Syncytial Virus by PCR POSITIVE (A) NEGATIVE Final    Comment: (NOTE) Fact Sheet for Patients: EntrepreneurPulse.com.au  Fact Sheet for Healthcare Providers: IncredibleEmployment.be  This test is not yet approved or cleared by the [place] FDA and has been authorized for detection and/or diagnosis of SARS-CoV-2 by FDA under an Emergency Use Authorization (EUA). This EUA will remain in effect (meaning this test can be used) for the duration of the COVID-19 declaration under Section 564(b)(1) of the Act, 21 U.S.C. section 360bbb-3(b)(1), unless the authorization is terminated or revoked.  Performed at [facility], [address] [place], [place]  [address]   Expectorated Sputum Assessment w Gram Stain, Rflx to Resp Cult     Status: None   Collection Time: 08/12/22  4:39 PM   Specimen: Sputum  Result Value Ref Range Status   Specimen Description SPUTUM  Final  Special Requests NONE  Final   Sputum evaluation   Final    Sputum specimen not acceptable for testing.  Please recollect.   NOTIFIED ASHLEY TREXLER ON 08/12/22 AT 1735 QSD Performed at [facility], [address] [place], [place] [address]    Report Status 08/12/2022 FINAL  Final  Expectorated Sputum Assessment w Gram Stain, Rflx to Resp Cult     Status: None   Collection Time: 08/13/22 10:52 AM   Specimen: Sputum  Result Value Ref Range Status   Specimen Description SPU  Final   Special Requests SPU  Final   Sputum evaluation   Final    THIS SPECIMEN IS ACCEPTABLE FOR SPUTUM CULTURE Performed at [facility], [address], [place], [place] [address]    Report Status 08/13/2022 FINAL  Final  Culture, Respiratory w Gram Stain     Status: None   Collection Time: 08/13/22 10:52 AM    Specimen: Sputum  Result Value Ref Range Status   Specimen Description   Final    SPU Performed at [facility], [address], [place], [place] [address]    Special Requests   Final    SPU Reflexed from [phone] Performed at [facility], [address] [place], [place] [address]    Gram Stain   Final    RARE WBC PRESENT, PREDOMINANTLY PMN MODERATE GRAM POSITIVE COCCI IN CHAINS FEW GRAM NEGATIVE RODS FEW YEAST PREVIOUSLY REPORTED AS: MODERATE GRAM NEGATIVE RODS MODERATE WBC PRESENT, PREDOMINANTLY PMN CORRECTED RESULTS CALLED TO:  C/  A. DAVIS, RN 08/13/22 2023 A. [facility]FR[facility]    Culture   Final    Normal respiratory flora-no Staph aureus or Pseudomonas seen Performed at [facility] [address], [place], [place] [address]    Report Status 08/16/2022 FINAL  Final    Radiology Studies: DG Chest Port 1 View  Result Date: 08/18/2022 CLINICAL DATA:  Shortness of breath. EXAM: PORTABLE CHEST 1 VIEW COMPARISON:  08/17/2022, 08/17/2022 and 06/18/2022 FINDINGS: Lungs are adequately inflated with mild emphysematous disease over the upper lungs. Minimal stable prominence of the central bronchovascular markings which may be due to minimal vascular congestion versus viral bronchopneumonia. No lobar airspace consolidation or effusion. No pneumothorax. Cardiomediastinal silhouette and remainder of the exam is unchanged. IMPRESSION: 1. Minimal stable prominence of the central bronchovascular markings which may be due to minimal vascular congestion versus viral bronchopneumonia. 2. Emphysematous disease. Electronically Signed   By: Marin Olp M.D.   On: 08/18/2022 14:37    Scheduled Meds:  apixaban  5 mg Oral BID   azithromycin  250 mg Oral Daily   diclofenac Sodium  2 g Topical QID   diltiazem  180 mg Oral BID   finasteride  5 mg Oral Daily   gabapentin  100 mg Oral BID   levalbuterol  0.63 mg Nebulization TID   melatonin  5 mg Oral QHS   pantoprazole  40 mg  Oral Daily   polyethylene glycol  17 g Oral Daily   predniSONE  40 mg Oral Q breakfast   rosuvastatin  20 mg Oral Daily   senna-docusate  1 tablet Oral BID   tamsulosin  0.4 mg Oral Daily   Continuous Infusions:   LOS: 7 days    Time spent: 35 mins    Ebubechukwu Jedlicka, MD [facility]   If 7PM-7AM, please contact night-coverage

## 2022-08-19 NOTE — TOC Progression Note (Signed)
Transition of Care (TOC) - Progression Note    Patient Details  Name: [name] [name]. MRN: [id number] Date of Birth: Dec 19, 1940  Transition of Care [facility]) CM/SW Contact  Valente David, RN Phone Number: 08/19/2022, 12:42 PM  Clinical Narrative:     Spoke with patient and wife, questions answered regarding discharge plan (wife was worried she needed to do something from her end).  Aware that [facility] team manages discharge process.  She report MD advised that discharge would be tomorrow.    Expected Discharge Plan: [place] Barriers to Discharge: Continued Medical Work up  Expected Discharge Plan and Services       Living arrangements for the past 2 months: [place]                                       Social Determinants of Health (SDOH) Interventions SDOH Screenings   Food Insecurity: No Food Insecurity (08/13/2022)  Housing: Low Risk  (08/13/2022)  Transportation Needs: No Transportation Needs (08/13/2022)  Utilities: Not At Risk (08/13/2022)  Depression (PHQ2-9): Low Risk  (07/05/2022)  Tobacco Use: Medium Risk (08/13/2022)    Readmission Risk Interventions     No data to display

## 2022-08-19 NOTE — Plan of Care (Signed)

## 2022-08-19 NOTE — Progress Notes (Signed)
PULMONOLOGY         Date: 08/19/2022,   MRN# [id number] [name] [name]. 05/25/1941     AdmissionWeight: 72.6 kg (recorded 06/2022)                 CurrentWeight: 72.6 kg (recorded 06/2022)  Referring provider: Dr. Dwyane Dee   CHIEF COMPLAINT:   Acute on chronic hypoxemic respiratory failure   HISTORY OF PRESENT ILLNESS   82 year old male with a history of dyslipidemia, alcohol abuse, essential hypertension BPH, advanced COPD with chronic hypoxemia on 3 L of oxygen via nasal cannula comes in with flulike illness and cough x 3 days with worsening shortness of breath.  On admission to the ER he also had atrial fibrillation with rapid ventricular response.  He was placed on BiPAP found to be positive for RSV.  He is hospitalized with findings of acute exacerbation of COPD and is having difficulty weaning off of oxygen with progressive dyspnea.  Cardiology is on board and has evaluated patient today.  During my evaluation patient did have an episode of not massive hemoptysis mixed in with thick and inspissated phlegm.  I reviewed his CT chest with findings of centrilobular emphysema and mild bilateral groundglass attenuation consistent with viral pneumonia.  08/18/22- patient shares he still has dyspnea.  He shares he has constipation which is causing discomfort.  He reports inability to participate with PT due to dyspnea. He asks for cream for painful toe. He walked only few steps.  Please continue prednisone '40mg'$  daily and zithromax 08/19/22- patient is improved, he was asking about increased wbc count, this is likely demarginatio from steroids I've discussed this.   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   Chronic obstructive pulmonary disease with (acute) exacerbation ([place]) 12/07/2020   Chronic respiratory failure with hypoxia ([place]) 08/10/2019   Chronic sinusitis 12/07/2020   Cigarette nicotine dependence, uncomplicated 00/86/7619   Coagulation disorder ([place]) 01/13/2019   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   Contracture of palmar fascia 12/07/2020   COPD (chronic obstructive pulmonary disease) ([place])    Corn of toe 12/07/2020   Corns and callosities 04/09/2018   Coronary artery disease    2019 with stents   Coronary atherosclerosis 05/07/2018   Degenerative disc disease, 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   Degenerative disc disease, 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   Diarrhea 04/09/2018   Double vision with both eyes open 12/07/2020   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   Dysphagia 08/14/2018   Dyspnea    ED (erectile dysfunction)    ED (erectile dysfunction) of organic origin 12/07/2020   Elevated prostate specific antigen (PSA) 04/08/2018   Elevated PSA    Encounter for screening for other disorder 12/07/2020   Enlarged prostate without lower urinary tract symptoms (luts) 04/09/2018   Enthesopathy 12/07/2020   Essential (primary) hypertension 04/08/2018   Ex-smoker 04/09/2018   Exertional dyspnea  04/02/2018   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   Flatulence 04/08/2018   Gastro-esophageal reflux disease without esophagitis 04/08/2018   GERD (gastroesophageal reflux disease)    History of acute otitis externa 08/14/2018   Hyperlipidemia    Hypertension    Hypoxemia 12/07/2020   Kidney cysts    Left knee pain 12/07/2020   Low back pain 04/08/2018   Male  erectile dysfunction 04/09/2018   Mixed hyperlipidemia 04/08/2018   Nocturia more than twice per night 04/09/2018   Osteoarthritis of first carpometacarpal joint 04/08/2018   Osteoarthritis of knee 12/07/2020   Other long term (current) drug therapy 12/07/2020   Pain due to onychomycosis of toenail of left foot 07/20/2019   Pain in joint 04/09/2018   Pain in knee 04/09/2018   Pain in unspecified knee 12/07/2020   Pain of finger 04/09/2018   Palpitations    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[place]) 12/07/2020   Personal history of colonic polyps 12/07/2020   Pneumonia    Pneumothorax 04/12/2020   Polypharmacy 04/09/2018   Porokeratosis 01/13/2019   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[place])    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[place]) 12/07/2020   Pulmonary nodules 06/08/2016   Pure hypercholesterolemia 12/07/2020   Renal cyst 12/07/2020   Sleep disorder 04/08/2018   Status post bronchoscopy 04/12/2020   Tear of rotator cuff 04/09/2018   Tobacco user 03/50/0938   Uncomplicated alcohol dependence ([place]) 04/09/2018   Unspecified rotator cuff tear or rupture of unspecified shoulder, not specified as traumatic 12/07/2020   Unspecified tear of unspecified meniscus, current injury, unspecified knee, initial encounter 12/07/2020   Visual disturbance 12/07/2020   Vitamin D deficiency 04/08/2018     SURGICAL HISTORY   Past Surgical History:  Procedure Laterality Date   BRONCHIAL BIOPSY  10/13/2019   Procedure: BRONCHIAL BIOPSIES;  Surgeon: Collene Gobble, MD;  Location: [place];  Service: Pulmonary;;   BRONCHIAL BIOPSY  04/12/2020   Procedure: BRONCHIAL BIOPSIES;  Surgeon: Collene Gobble, MD;  Location: [place];  Service: Pulmonary;;   BRONCHIAL BRUSHINGS  10/13/2019   Procedure: BRONCHIAL BRUSHINGS;  Surgeon: Collene Gobble, MD;  Location: [facility] ENDOSCOPY;  Service: Pulmonary;;   BRONCHIAL BRUSHINGS  04/12/2020   Procedure: BRONCHIAL BRUSHINGS;  Surgeon: Collene Gobble, MD;  Location[facility]COPY;  Service:  Pulmonary;;   BRONCHIAL NEEDLE ASPIRATION BIOPSY  10/13/2019   Procedure: BRONCHIAL NEEDLE ASPIRATION BIOPSIES;  Surgeon: Collene Gobble, MD;  Location: [facility] ENDOSCOPY;  Service: Pulmonary;;   BRONCHIAL NEEDLE ASPIRATION BIOPSY  04/12/2020   Procedure: BRONCHIAL NEEDLE ASPIRATION BIOPSIES;  Surgeon: Collene Gobble, MD;  [facility];  Service: Pulmonary;;   BRONCHIAL WASHINGS  10/13/2019   Procedure: BRONCHIAL WASHINGS;  Surgeon: Collene Gobble, MD;  Location: [facility] ENDOSCOPY;  Service: Pulmonary;;   BRONCHIAL WASHINGS  04/12/2020   Procedure: BRONCHIAL WASHINGS;  Surgeon: Collene Gobble, MD;  Location: [place] ENDOSCOPY;  Service: Pulmonary;;   CARDIAC CATHETERIZATION  2002   50% RCA   CATARACT EXTRACTION, BILATERAL     CORONARY STENT INTERVENTION N/A 04/15/2018   Procedure: CORONARY STENT INTERVENTION;  Surgeon: Jettie Booze, MD;  Location: [place] CV LAB;  Service: Cardiovascular;  Laterality: N/A;  om1   EYE SURGERY     KNEE ARTHROSCOPY     LEFT HEART CATH AND CORONARY ANGIOGRAPHY N/A 04/15/2018   Procedure: LEFT HEART CATH AND CORONARY ANGIOGRAPHY;  Surgeon: Jettie Booze, MD;  Location: [place] CV LAB;  Service: Cardiovascular;  Laterality: N/A;   MULTIPLE TOOTH EXTRACTIONS  TONSILLECTOMY     VIDEO BRONCHOSCOPY  04/12/2020   VIDEO BRONCHOSCOPY WITH ENDOBRONCHIAL NAVIGATION N/A 07/26/2016   Procedure: VIDEO BRONCHOSCOPY WITH ENDOBRONCHIAL NAVIGATION;  Surgeon: Collene Gobble, MD;  Location: [place];  Service: Thoracic;  Laterality: N/A;   VIDEO BRONCHOSCOPY WITH ENDOBRONCHIAL NAVIGATION N/A 10/13/2019   Procedure: [facility] AND VIDEO BRONCHOSCOPY WITH ENDOBRONCHIAL NAVIGATION;  Surgeon: Collene Gobble, MD;  Location: [place] ENDOSCOPY;  Service: Pulmonary;  Laterality: N/A;   VIDEO BRONCHOSCOPY WITH ENDOBRONCHIAL NAVIGATION N/A 04/12/2020   Procedure: VIDEO BRONCHOSCOPY WITH ENDOBRONCHIAL NAVIGATION;  Surgeon: Collene Gobble, MD;  Location: [place] ENDOSCOPY;  Service:  Pulmonary;  Laterality: N/A;     FAMILY HISTORY   Family History  Problem Relation Age of Onset   Hypertension Mother    Alzheimer's disease Mother      SOCIAL HISTORY   Social History   Tobacco Use   Smoking status: Former    Packs/day: 0.75    Years: 67.00    Total pack years: 50.25    Types: Cigarettes    Start date: 35    Quit date: 07/13/2020    Years since quitting: 2.1    Passive exposure: Never   Smokeless tobacco: Never   Tobacco comments:    Recent Quit    Vaping Use   Vaping Use: Former  Substance Use Topics   Alcohol use: No    Alcohol/week: 0.0 standard drinks of alcohol    Comment: quit drinking 2014   Drug use: No     MEDICATIONS    Home Medication:    Current Medication:  Current Facility-Administered Medications:    acetaminophen (TYLENOL) tablet 650 mg, 650 mg, Oral, Q6H PRN, Ivor Costa, MD, 650 mg at 08/18/22 2148   albuterol (PROVENTIL) (2.5 MG/3ML) 0.083% nebulizer solution 2.5 mg, 2.5 mg, Nebulization, Q4H PRN, Ivor Costa, MD, 2.5 mg at 08/17/22 0521   apixaban (ELIQUIS) tablet 5 mg, 5 mg, Oral, BID, Emeterio Reeve, DO, 5 mg at 08/19/22 0854   azithromycin (ZITHROMAX) tablet 250 mg, 250 mg, Oral, Daily, Lanney Gins, Seanne Chirico, MD, 250 mg at 08/19/22 0855   clonazePAM (KLONOPIN) tablet 0.5 mg, 0.5 mg, Oral, Daily PRN, Ivor Costa, MD, 0.5 mg at 08/18/22 1429   dextromethorphan-guaiFENesin ([place] DM) 30-600 MG per 12 hr tablet 1 tablet, 1 tablet, Oral, BID PRN, Ivor Costa, MD, 1 tablet at 08/17/22 1610   diclofenac Sodium (VOLTAREN) 1 % topical gel 2 g, 2 g, Topical, QID, Jet Traynham, MD, 2 g at 08/19/22 1413   diltiazem (CARDIZEM CD) 24 hr capsule 180 mg, 180 mg, Oral, BID, Dunn, Ryan M, PA-C, 180 mg at 08/19/22 0855   finasteride (PROSCAR) tablet 5 mg, 5 mg, Oral, Daily, Ivor Costa, MD, 5 mg at 08/19/22 0855   gabapentin (NEURONTIN) capsule 100 mg, 100 mg, Oral, BID, Ivor Costa, MD, 100 mg at 08/19/22 0855   levalbuterol (XOPENEX)  nebulizer solution 0.63 mg, 0.63 mg, Nebulization, BID, [place]pp[place] Coil, Natalie, DO   melatonin tablet 5 mg, 5 mg, Oral, QHS, Niu, Soledad Gerlach, MD, 5 mg at 08/18/22 2148   nitroGLYCERIN (NITROSTAT) SL tablet 0.4 mg, 0.4 mg, Sublingual, Q5 min PRN, Ivor Costa, MD   ondansetron (ZOFRAN) injection 4 mg, 4 mg, Intravenous, Q8H PRN, Ivor Costa, MD   pantoprazole (PROTONIX) EC tablet 40 mg, 40 mg, Oral, Daily, Ivor Costa, MD, 40 mg at 08/19/22 0855   polyethylene glycol (MIRALAX / GLYCOLAX) packet 17 g, 17 g, Oral, Daily, Shawna Clamp, MD, 17 g at 08/19/22 [phone]   [  START ON 08/20/2022] predniSONE (DELTASONE) tablet 35 mg, 35 mg, Oral, Q breakfast, Lanney Gins, Raigan Baria, MD   rosuvastatin (CRESTOR) tablet 20 mg, 20 mg, Oral, Daily, Ivor Costa, MD, 20 mg at 08/19/22 3557   senna-docusate (Senokot-S) tablet 1 tablet, 1 tablet, Oral, BID, Shawna Clamp, MD, 1 tablet at 08/19/22 1412   tamsulosin (FLOMAX) capsule 0.4 mg, 0.4 mg, Oral, Daily, Ivor Costa, MD, 0.4 mg at 08/19/22 3220    ALLERGIES   Flagyl [metronidazole]     REVIEW OF SYSTEMS    Review of Systems:  Gen:  Denies  fever, sweats, chills weigh loss  HEENT: Denies blurred vision, double vision, ear pain, eye pain, hearing loss, nose bleeds, sore throat Cardiac:  No dizziness, chest pain or heaviness, chest tightness,edema Resp:   reports dyspnea chronically  Gi: Denies swallowing difficulty, stomach pain, nausea or vomiting, diarrhea, constipation, bowel incontinence Gu:  Denies bladder incontinence, burning urine Ext:   Denies Joint pain, stiffness or swelling Skin: Denies  skin rash, easy bruising or bleeding or hives Endoc:  Denies polyuria, polydipsia , polyphagia or weight change Psych:   Denies depression, insomnia or hallucinations   Other:  All other systems negative   VS: BP 127/61   Pulse 86   Temp 98.2 F (36.8 C) (Oral)   Resp 18   Wt 72.6 kg Comment: recorded 06/2022  SpO2 100%   BMI 22.64 kg/m      PHYSICAL EXAM     GENERAL:NAD, no fevers, chills, no weakness no fatigue HEAD: Normocephalic, atraumatic.  EYES: Pupils equal, round, reactive to light. Extraocular muscles intact. No scleral icterus.  MOUTH: Moist mucosal membrane. Dentition intact. No abscess noted.  EAR, NOSE, THROAT: Clear without exudates. No external lesions.  NECK: Supple. No thyromegaly. No nodules. No JVD.  PULMONARY: decreased breath sounds with mild rhonchi worse at bases bilaterally.  CARDIOVASCULAR: S1 and S2. Regular rate and rhythm. No murmurs, rubs, or gallops. No edema. Pedal pulses 2+ bilaterally.  GASTROINTESTINAL: Soft, nontender, nondistended. No masses. Positive bowel sounds. No hepatosplenomegaly.  MUSCULOSKELETAL: No swelling, clubbing, or edema. Range of motion full in all extremities.  NEUROLOGIC: Cranial nerves II through XII are intact. No gross focal neurological deficits. Sensation intact. Reflexes intact.  SKIN: No ulceration, lesions, rashes, or cyanosis. Skin warm and dry. Turgor intact.  PSYCHIATRIC: Mood, affect within normal limits. The patient is awake, alert and oriented x 3. Insight, judgment intact.       IMAGING     ASSESSMENT/PLAN   Severe acute exacerbation of COPD -Present on admission due to viral RSV infection -Continue nebulizer therapy with Xopenex, will DC ipratropium due to arrhythmogenic potential,  -Have initiated Zithromax p.o. 250 mg, continue with prednisone currently at 35 mg. 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  Cardiology team is on board-appreciate input  Thank you for allowing me to participate in the care of this patient.   Patient/Family are satisfied with care plan and all questions have been answered.    Provider disclosure: Patient with at least one acute or chronic illness or injury  that poses a threat to life or bodily function and is being managed actively during this encounter.  All of the below services have been performed independently by signing provider:  review of prior documentation from internal and or external health records.  Review of previous and current lab results.  Interview and comprehensive assessment during patient visit today. Review of current and previous chest radiographs/CT scans. Discussion of management and test interpretation with health care team and patient/family.   This document was prepared using Dragon voice recognition software and may include unintentional dictation errors.     Ottie Glazier, M.D.  Division of Pulmonary & Critical Care Medicine

## 2022-08-20 DIAGNOSIS — I4819 Other persistent atrial fibrillation: Secondary | ICD-10-CM | POA: Diagnosis not present

## 2022-08-20 DIAGNOSIS — R29898 Other symptoms and signs involving the musculoskeletal system: Secondary | ICD-10-CM | POA: Diagnosis not present

## 2022-08-20 DIAGNOSIS — I951 Orthostatic hypotension: Secondary | ICD-10-CM | POA: Diagnosis not present

## 2022-08-20 DIAGNOSIS — J431 Panlobular emphysema: Secondary | ICD-10-CM | POA: Diagnosis not present

## 2022-08-20 DIAGNOSIS — J449 Chronic obstructive pulmonary disease, unspecified: Secondary | ICD-10-CM | POA: Diagnosis not present

## 2022-08-20 DIAGNOSIS — K219 Gastro-esophageal reflux disease without esophagitis: Secondary | ICD-10-CM | POA: Diagnosis not present

## 2022-08-20 DIAGNOSIS — E785 Hyperlipidemia, unspecified: Secondary | ICD-10-CM | POA: Diagnosis not present

## 2022-08-20 DIAGNOSIS — M6281 Muscle weakness (generalized): Secondary | ICD-10-CM | POA: Diagnosis not present

## 2022-08-20 DIAGNOSIS — R531 Weakness: Secondary | ICD-10-CM | POA: Diagnosis not present

## 2022-08-20 DIAGNOSIS — I5033 Acute on chronic diastolic (congestive) heart failure: Secondary | ICD-10-CM | POA: Diagnosis not present

## 2022-08-20 DIAGNOSIS — Z9981 Dependence on supplemental oxygen: Secondary | ICD-10-CM | POA: Diagnosis not present

## 2022-08-20 DIAGNOSIS — I10 Essential (primary) hypertension: Secondary | ICD-10-CM | POA: Diagnosis not present

## 2022-08-20 DIAGNOSIS — Z743 Need for continuous supervision: Secondary | ICD-10-CM | POA: Diagnosis not present

## 2022-08-20 DIAGNOSIS — I959 Hypotension, unspecified: Secondary | ICD-10-CM | POA: Diagnosis not present

## 2022-08-20 DIAGNOSIS — E441 Mild protein-calorie malnutrition: Secondary | ICD-10-CM | POA: Diagnosis not present

## 2022-08-20 DIAGNOSIS — I4892 Unspecified atrial flutter: Secondary | ICD-10-CM | POA: Diagnosis not present

## 2022-08-20 DIAGNOSIS — J9611 Chronic respiratory failure with hypoxia: Secondary | ICD-10-CM | POA: Diagnosis not present

## 2022-08-20 DIAGNOSIS — I2510 Atherosclerotic heart disease of native coronary artery without angina pectoris: Secondary | ICD-10-CM | POA: Diagnosis not present

## 2022-08-20 DIAGNOSIS — I4891 Unspecified atrial fibrillation: Secondary | ICD-10-CM | POA: Diagnosis not present

## 2022-08-20 DIAGNOSIS — L98429 Non-pressure chronic ulcer of back with unspecified severity: Secondary | ICD-10-CM | POA: Diagnosis not present

## 2022-08-20 DIAGNOSIS — R609 Edema, unspecified: Secondary | ICD-10-CM | POA: Diagnosis not present

## 2022-08-20 DIAGNOSIS — R278 Other lack of coordination: Secondary | ICD-10-CM | POA: Diagnosis not present

## 2022-08-20 DIAGNOSIS — R2681 Unsteadiness on feet: Secondary | ICD-10-CM | POA: Diagnosis not present

## 2022-08-20 DIAGNOSIS — J9612 Chronic respiratory failure with hypercapnia: Secondary | ICD-10-CM | POA: Diagnosis not present

## 2022-08-20 DIAGNOSIS — I739 Peripheral vascular disease, unspecified: Secondary | ICD-10-CM | POA: Diagnosis not present

## 2022-08-20 DIAGNOSIS — R0602 Shortness of breath: Secondary | ICD-10-CM | POA: Diagnosis not present

## 2022-08-20 DIAGNOSIS — R5381 Other malaise: Secondary | ICD-10-CM | POA: Diagnosis not present

## 2022-08-20 DIAGNOSIS — Z515 Encounter for palliative care: Secondary | ICD-10-CM | POA: Diagnosis not present

## 2022-08-20 DIAGNOSIS — I4811 Longstanding persistent atrial fibrillation: Secondary | ICD-10-CM | POA: Insufficient documentation

## 2022-08-20 DIAGNOSIS — J205 Acute bronchitis due to respiratory syncytial virus: Secondary | ICD-10-CM | POA: Diagnosis not present

## 2022-08-20 DIAGNOSIS — G479 Sleep disorder, unspecified: Secondary | ICD-10-CM | POA: Diagnosis not present

## 2022-08-20 DIAGNOSIS — Z7401 Bed confinement status: Secondary | ICD-10-CM | POA: Diagnosis not present

## 2022-08-20 DIAGNOSIS — Z741 Need for assistance with personal care: Secondary | ICD-10-CM | POA: Diagnosis not present

## 2022-08-20 DIAGNOSIS — J441 Chronic obstructive pulmonary disease with (acute) exacerbation: Secondary | ICD-10-CM | POA: Diagnosis not present

## 2022-08-20 DIAGNOSIS — J9621 Acute and chronic respiratory failure with hypoxia: Secondary | ICD-10-CM | POA: Diagnosis not present

## 2022-08-20 DIAGNOSIS — R600 Localized edema: Secondary | ICD-10-CM | POA: Diagnosis not present

## 2022-08-20 DIAGNOSIS — E782 Mixed hyperlipidemia: Secondary | ICD-10-CM | POA: Diagnosis not present

## 2022-08-20 LAB — POTASSIUM: Potassium: 4.6 mmol/L (ref 3.5–5.1)

## 2022-08-20 MED ORDER — APIXABAN 5 MG PO TABS: 5.0000 mg | ORAL_TABLET | Freq: Two times a day (BID) | ORAL | 5 refills | Status: DC

## 2022-08-20 MED ORDER — PREDNISONE 20 MG PO TABS: 30.0000 mg | ORAL_TABLET | Freq: Every day | ORAL | Status: DC

## 2022-08-20 MED ORDER — AZITHROMYCIN 250 MG PO TABS: 250.0000 mg | ORAL_TABLET | Freq: Every day | ORAL | 0 refills | Status: AC

## 2022-08-20 MED ORDER — DILTIAZEM HCL ER COATED BEADS 180 MG PO CP24: 180.0000 mg | ORAL_CAPSULE | Freq: Two times a day (BID) | ORAL | 1 refills | Status: DC

## 2022-08-20 MED ORDER — AZITHROMYCIN 250 MG PO TABS: 250.0000 mg | ORAL_TABLET | Freq: Every day | ORAL | 0 refills | Status: DC

## 2022-08-20 MED ORDER — PREDNISONE 10 MG PO TABS: ORAL_TABLET | ORAL | 0 refills | Status: DC

## 2022-08-20 MED ORDER — CLONAZEPAM 0.5 MG PO TABS: 0.5000 mg | ORAL_TABLET | Freq: Every day | ORAL | 0 refills | Status: DC | PRN

## 2022-08-20 NOTE — Progress Notes (Signed)
Occupational Therapy Treatment Patient Details Name: [name] [name]. MRN: [id number] DOB: 1941-03-20 Today's Date: 08/20/2022   History of present illness Pt is an 82 y.o. male presenting to hospital 08/12/22 with worsening SOB and productive cough last several days; pt noted with elevated HR.  (+) RSV.  Pt admited with acute on chronic respiratory failure with hypoxia and RSV infection; RSV induced COPD exacerbation; and a-fib with RVR (new onset).  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   OT comments  [name] is making progress toward his self care goals.  Patient reports feeling positive about upcoming discharge.  OT provided education re: discharge recommendations, DME, fall and safety precautions to optimize safety after discharge.  Patient initially agreeable to OOB mobility to support functional strengthening and endurance. OT provided supervision assist for bed mobility.  Patient then reported need to have BM, declined offer to ambulate to bathroom or BSC.  OT provided patient with bedpan per patient request.  He requested OT return at later time, and denied further questions.  Will continue to follow up as patient is available.  [name] will likely continue to benefit from skilled OT services in acute setting to support functional strengthening, endurance, safety and independence in ADLs.  Discharge recommendations remain appropriate.   Recommendations for follow up therapy are one component of a multi-disciplinary discharge planning process, led by the attending physician.  Recommendations may be updated based on patient status, additional functional criteria and insurance authorization.    Follow Up Recommendations  Skilled nursing-short term rehab (<3 hours/day)     Assistance Recommended at Discharge Intermittent Supervision/Assistance  Patient can return home with the following  A lot of help with walking and/or transfers;A lot of  help with bathing/dressing/bathroom;Help with stairs or ramp for entrance   Equipment Recommendations   (defer to next level of care)    Recommendations for Other Services      Precautions / Restrictions Precautions Precautions: Fall Restrictions Weight Bearing Restrictions: No       Mobility Bed Mobility Overal bed mobility: Modified Independent             General bed mobility comments: Mild increased effort to perform on own; [facility] elevated Patient Response: Cooperative  Transfers Overall transfer level: Needs assistance                       Balance Overall balance assessment: Needs assistance                                         ADL either performed or assessed with clinical judgement   ADL Overall ADL's : Needs assistance/impaired                                            Extremity/Trunk Assessment Upper Extremity Assessment Upper Extremity Assessment: Overall WFL for tasks assessed   Lower Extremity Assessment Lower Extremity Assessment: Generalized weakness   Cervical / Trunk Assessment Cervical / Trunk Assessment: Normal    Vision Patient Visual Report: No change from baseline     Perception     Praxis      Cognition Arousal/Alertness: Awake/alert Behavior During Therapy: WFL for tasks assessed/performed Overall Cognitive Status: Within Functional  Limits for tasks assessed                                          Exercises Other Exercises Other Exercises: provided education re: OT role and plan of care, fall and safety precautions, discharge recommendations    Shoulder Instructions       General Comments SpO2 at 100% on 3L at rest    Pertinent Vitals/ Pain       Pain Assessment Pain Assessment: No/denies pain  Home Living                                          Prior Functioning/Environment              Frequency  Min 2X/week         Progress Toward Goals  OT Goals(current goals can now be found in the care plan section)  Progress towards OT goals: Progressing toward goals  Acute Rehab OT Goals Patient Stated Goal: to return to PLOF OT Goal Formulation: With patient/family Time For Goal Achievement: 08/31/22 Potential to Achieve Goals: Good  Plan Discharge plan remains appropriate;Frequency remains appropriate    Co-evaluation                 AM-PAC OT "6 Clicks" Daily Activity     Outcome Measure   Help from another person eating meals?: None Help from another person taking care of personal grooming?: A Little Help from another person toileting, which includes using toliet, bedpan, or urinal?: A Lot Help from another person bathing (including washing, rinsing, drying)?: A Lot Help from another person to put on and taking off regular upper body clothing?: A Little Help from another person to put on and taking off regular lower body clothing?: A Lot 6 Click Score: 16    End of Session    OT Visit Diagnosis: Other abnormalities of gait and mobility (R26.89);Muscle weakness (generalized) (M62.81)   Activity Tolerance Patient tolerated treatment well   Patient Left in bed;with call bell/phone within reach;with family/visitor present   Nurse Communication          Time: 6270-3500 OT Time Calculation (min): 11 min  Charges: OT General Charges $OT Visit: 1 Visit OT Treatments $Therapeutic Activity: 8-22 mins  Jeneen Montgomery, OTR/L 08/20/22, 12:37 PM

## 2022-08-20 NOTE — TOC Transition Note (Signed)
Transition of Care [facility]) - CM/SW Discharge Note   Patient Details  Name: [name] [name]. MRN: [id number] Date of Birth: 30-Aug-1940  Transition of Care [facility]) CM/SW Contact:  Laurena Slimmer, RN Phone Number: 08/20/2022, 2:24 PM   Clinical Narrative:    Discharge summary sent in [place] Patient room assignment sent by Seth Bake  Patient room #120 Nurse will call report to [phone] Face sheet and medical necessity forms printed to floor to be added to EMS packet Nurse notified.   TOC signing off.      Barriers to Discharge: Continued Medical Work up   Patient Goals and CMS Choice CMS Medicare.gov Compare Post Acute Care list provided to:: Patient Choice offered to / list presented to : Patient  Discharge Placement                         Discharge Plan and Services Additional resources added to the After Visit Summary for                                       Social Determinants of Health (SDOH) Interventions SDOH Screenings   Food Insecurity: No Food Insecurity (08/13/2022)  Housing: Low Risk  (08/13/2022)  Transportation Needs: No Transportation Needs (08/13/2022)  Utilities: Not At Risk (08/13/2022)  Depression (PHQ2-9): Low Risk  (07/05/2022)  Tobacco Use: Medium Risk (08/13/2022)     Readmission Risk Interventions     No data to display

## 2022-08-20 NOTE — Care Management Important Message (Signed)
Important Message  Patient Details  Name: [name] [name]. MRN: [id number] Date of Birth: 21-May-1941   Medicare Important Message Given:  Yes  Reviewed Medicare IM with patient via room phone [phone]).   Dannette Barbara 08/20/2022, 1:31 PM

## 2022-08-20 NOTE — Progress Notes (Signed)
PULMONOLOGY         Date: 08/20/2022,   MRN# [id number] [name] [name]. 03-Jan-1941     AdmissionWeight: 72.6 kg (recorded 06/2022)                 CurrentWeight: 72.6 kg (recorded 06/2022)  Referring provider: Dr. Dwyane Dee   CHIEF COMPLAINT:   Acute on chronic hypoxemic respiratory failure   HISTORY OF PRESENT ILLNESS   82 year old male with a history of dyslipidemia, alcohol abuse, essential hypertension BPH, advanced COPD with chronic hypoxemia on 3 L of oxygen via nasal cannula comes in with flulike illness and cough x 3 days with worsening shortness of breath.  On admission to the ER he also had atrial fibrillation with rapid ventricular response.  He was placed on BiPAP found to be positive for RSV.  He is hospitalized with findings of acute exacerbation of COPD and is having difficulty weaning off of oxygen with progressive dyspnea.  Cardiology is on board and has evaluated patient today.  During my evaluation patient did have an episode of not massive hemoptysis mixed in with thick and inspissated phlegm.  I reviewed his CT chest with findings of centrilobular emphysema and mild bilateral groundglass attenuation consistent with viral pneumonia.  08/18/22- patient shares he still has dyspnea.  He shares he has constipation which is causing discomfort.  He reports inability to participate with PT due to dyspnea. He asks for cream for painful toe. He walked only few steps.  Please continue prednisone '40mg'$  daily and zithromax 08/19/22- patient is improved, he was asking about increased wbc count, this is likely demargination from steroids I've discussed this with patient.  08/20/22- patient is closer to baseline but overall has advanced COPD.  He has poor long term prognosis but is optimized for dc planning   PAST MEDICAL HISTORY   Past Medical History:  Diagnosis Date   Abdominal discomfort 12/07/2020   Acquired trigger finger of right middle finger 02/04/2020   Acute  prostatitis 04/08/2018   Alcohol abuse    Alcohol dependence ([place]) 12/07/2020   Anal or rectal pain 04/08/2018   Annual physical exam 12/07/2020   Anorectal disorder 12/07/2020   Arthritis    Atherosclerotic heart disease of native coronary artery without angina pectoris 04/08/2018   Benign prostatic hyperplasia with lower urinary tract symptoms 12/07/2020   BPH with elevated PSA    Cancer ([place])    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   Chronic obstructive pulmonary disease with (acute) exacerbation ([place]) 12/07/2020   Chronic respiratory failure with hypoxia ([place]) 08/10/2019   Chronic sinusitis 12/07/2020   Cigarette nicotine dependence, uncomplicated 88/50/2774   Coagulation disorder ([place]) 01/13/2019   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   Contracture of palmar fascia 12/07/2020   COPD (chronic obstructive pulmonary disease) ([place])    Corn of toe 12/07/2020   Corns and callosities 04/09/2018   Coronary artery disease    2019 with stents   Coronary atherosclerosis 05/07/2018   Degenerative disc disease, 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   Degenerative disc disease, 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   Diarrhea 04/09/2018   Double vision with both eyes open 12/07/2020   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   Dysphagia 08/14/2018   Dyspnea    ED (erectile dysfunction)    ED (erectile dysfunction) of organic origin 12/07/2020   Elevated prostate specific antigen (PSA) 04/08/2018   Elevated PSA    Encounter for screening for other disorder 12/07/2020  Enlarged prostate without lower urinary tract symptoms (luts) 04/09/2018   Enthesopathy 12/07/2020   Essential (primary) hypertension 04/08/2018   Ex-smoker 04/09/2018   Exertional dyspnea 04/02/2018   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   Flatulence 04/08/2018   Gastro-esophageal reflux disease without esophagitis 04/08/2018   GERD (gastroesophageal reflux disease)    History of acute otitis externa  08/14/2018   Hyperlipidemia    Hypertension    Hypoxemia 12/07/2020   Kidney cysts    Left knee pain 12/07/2020   Low back pain 04/08/2018   Male erectile dysfunction 04/09/2018   Mixed hyperlipidemia 04/08/2018   Nocturia more than twice per night 04/09/2018   Osteoarthritis of first carpometacarpal joint 04/08/2018   Osteoarthritis of knee 12/07/2020   Other long term (current) drug therapy 12/07/2020   Pain due to onychomycosis of toenail of left foot 07/20/2019   Pain in joint 04/09/2018   Pain in knee 04/09/2018   Pain in unspecified knee 12/07/2020   Pain of finger 04/09/2018   Palpitations    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[place]) 12/07/2020   Personal history of colonic polyps 12/07/2020   Pneumonia    Pneumothorax 04/12/2020   Polypharmacy 04/09/2018   Porokeratosis 01/13/2019   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[place])    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[place]) 12/07/2020   Pulmonary nodules 06/08/2016   Pure hypercholesterolemia 12/07/2020   Renal cyst 12/07/2020   Sleep disorder 04/08/2018   Status post bronchoscopy 04/12/2020   Tear of rotator cuff 04/09/2018   Tobacco user 16/60/6301   Uncomplicated alcohol dependence ([place]) 04/09/2018   Unspecified rotator cuff tear or rupture of unspecified shoulder, not specified as traumatic 12/07/2020   Unspecified tear of unspecified meniscus, current injury, unspecified knee, initial encounter 12/07/2020   Visual disturbance 12/07/2020   Vitamin D deficiency 04/08/2018     SURGICAL HISTORY   Past Surgical History:  Procedure Laterality Date   BRONCHIAL BIOPSY  10/13/2019   Procedure: BRONCHIAL BIOPSIES;  Surgeon: Collene Gobble, MD;  Location: [place];  Service: Pulmonary;;   BRONCHIAL BIOPSY  04/12/2020   Procedure: BRONCHIAL BIOPSIES;  Surgeon: Collene Gobble, MD;  Location: [place];  Service: Pulmonary;;   BRONCHIAL BRUSHINGS  10/13/2019   Procedure: BRONCHIAL BRUSHINGS;  Surgeon: Collene Gobble, MD;  Location: [facility] ENDOSCOPY;  Service:  Pulmonary;;   BRONCHIAL BRUSHINGS  04/12/2020   Procedure: BRONCHIAL BRUSHINGS;  Surgeon: Collene Gobble, MD;  Location[facility] ENDOSCOPY;  Service: Pulmonary;;   BRONCHIAL NEEDLE ASPIRATION BIOPSY  10/13/2019   Procedure: BRONCHIAL NEEDLE ASPIRATION BIOPSIES;  Surgeon: Collene Gobble, MD;  Location: [facility] ENDOSCOPY;  Service: Pulmonary;;   BRONCHIAL NEEDLE ASPIRATION BIOPSY  04/12/2020   Procedure: BRONCHIAL NEEDLE ASPIRATION BIOPSIES;  Surgeon: Collene Gobble, MD;  Location: [facility] ENDOSCOPY;  Service: Pulmonary;;   BRONCHIAL WASHINGS  10/13/2019   Procedure: BRONCHIAL WASHINGS;  Surgeon: Collene Gobble, MD;  Location: [place];  Service: Pulmonary;;   BRONCHIAL WASHINGS  04/12/2020   Procedure: BRONCHIAL WASHINGS;  Surgeon: Collene Gobble, MD;  Location: [place] ENDOSCOPY;  Service: Pulmonary;;   CARDIAC CATHETERIZATION  2002   50% RCA   CATARACT EXTRACTION, BILATERAL     CORONARY STENT INTERVENTION N/A 04/15/2018   Procedure: CORONARY STENT INTERVENTION;  Surgeon: Jettie Booze, MD;  Location: [place] CV LAB;  Service: Cardiovascular;  Laterality: N/A;  om1   EYE SURGERY     KNEE ARTHROSCOPY     LEFT HEART CATH AND CORONARY ANGIOGRAPHY N/A 04/15/2018   Procedure: LEFT HEART CATH AND  CORONARY ANGIOGRAPHY;  Surgeon: Jettie Booze, MD;  Location: [place] [facility];  Service: Cardiovascular;  Laterality: N/A;   MULTIPLE TOOTH EXTRACTIONS     TONSILLECTOMY     VIDEO BRONCHOSCOPY  04/12/2020   VIDEO BRONCHOSCOPY WITH ENDOBRONCHIAL NAVIGATION N/A 07/26/2016   Procedure: VIDEO BRONCHOSCOPY WITH ENDOBRONCHIAL NAVIGATION;  Surgeon: Collene Gobble, MD;  Location: [place];  Service: Thoracic;  Laterality: N/A;   VIDEO BRONCHOSCOPY WITH ENDOBRONCHIAL NAVIGATION N/A 10/13/2019   Procedure: [employer] AND VIDEO BRONCHOSCOPY WITH ENDOBRONCHIAL NAVIGATION;  Surgeon: Collene Gobble, MD;  Location: [place] ENDOSCOPY;  Service: Pulmonary;  Laterality: N/A;   VIDEO BRONCHOSCOPY WITH ENDOBRONCHIAL NAVIGATION N/A  04/12/2020   Procedure: VIDEO BRONCHOSCOPY WITH ENDOBRONCHIAL NAVIGATION;  Surgeon: Collene Gobble, MD;  Location: [place] ENDOSCOPY;  Service: Pulmonary;  Laterality: N/A;     FAMILY HISTORY   Family History  Problem Relation Age of Onset   Hypertension Mother    Alzheimer's disease Mother      SOCIAL HISTORY   Social History   Tobacco Use   Smoking status: Former    Packs/day: 0.75    Years: 67.00    Total pack years: 50.25    Types: Cigarettes    Start date: 3    Quit date: 07/13/2020    Years since quitting: 2.1    Passive exposure: Never   Smokeless tobacco: Never   Tobacco comments:    Recent Quit    Vaping Use   Vaping Use: Former  Substance Use Topics   Alcohol use: No    Alcohol/week: 0.0 standard drinks of alcohol    Comment: quit drinking 2014   Drug use: No     MEDICATIONS    Home Medication:    Current Medication:  Current Facility-Administered Medications:    acetaminophen (TYLENOL) tablet 650 mg, 650 mg, Oral, Q6H PRN, Ivor Costa, MD, 650 mg at 08/19/22 2128   albuterol (PROVENTIL) (2.5 MG/3ML) 0.083% nebulizer solution 2.5 mg, 2.5 mg, Nebulization, Q4H PRN, Ivor Costa, MD, 2.5 mg at 08/20/22 0534   apixaban (ELIQUIS) tablet 5 mg, 5 mg, Oral, BID, Emeterio Reeve, DO, 5 mg at 08/19/22 2128   azithromycin (ZITHROMAX) tablet 250 mg, 250 mg, Oral, Daily, [place] Gins, Ermon Sagan, MD, 250 mg at 08/19/22 0855   clonazePAM (KLONOPIN) tablet 0.5 mg, 0.5 mg, Oral, Daily PRN, Ivor Costa, MD, 0.5 mg at 08/19/22 2128   dextromethorphan-guaiFENesin ([place] DM) 30-600 MG per 12 hr tablet 1 tablet, 1 tablet, Oral, BID PRN, Ivor Costa, MD, 1 tablet at 08/17/22 1610   diclofenac Sodium (VOLTAREN) 1 % topical gel 2 g, 2 g, Topical, QID, [place]e Agent, MD, 2 g at 08/19/22 2131   diltiazem (CARDIZEM CD) 24 hr capsule 180 mg, 180 mg, Oral, BID, Dunn, Ryan M, PA-C, 180 mg at 08/19/22 2125   finasteride (PROSCAR) tablet 5 mg, 5 mg, Oral, Daily, Ivor Costa, MD, 5 mg at  08/19/22 0855   gabapentin (NEURONTIN) capsule 100 mg, 100 mg, Oral, BID, Ivor Costa, MD, 100 mg at 08/19/22 2128   levalbuterol (XOPENEX) nebulizer solution 0.63 mg, 0.63 mg, Nebulization, BID, Emeterio Reeve, DO, 0.63 mg at 08/19/22 2037   melatonin tablet 5 mg, 5 mg, Oral, QHS, Ivor Costa, MD, 5 mg at 08/19/22 2128   nitroGLYCERIN (NITROSTAT) SL tablet 0.4 mg, 0.4 mg, Sublingual, Q5 min PRN, Ivor Costa, MD   ondansetron (ZOFRAN) injection 4 mg, 4 mg, Intravenous, Q8H PRN, Ivor Costa, MD   pantoprazole (PROTONIX) EC tablet 40 mg, 40 mg,  Oral, Daily, Ivor Costa, MD, 40 mg at 08/19/22 0855   polyethylene glycol (MIRALAX / GLYCOLAX) packet 17 g, 17 g, Oral, Daily, Shawna Clamp, MD, 17 g at 08/19/22 0858   predniSONE (DELTASONE) tablet 35 mg, 35 mg, Oral, Q breakfast, Lanney Gins, Seairra Otani, MD   rosuvastatin (CRESTOR) tablet 20 mg, 20 mg, Oral, Daily, Ivor Costa, MD, 20 mg at 08/19/22 8756   senna-docusate (Senokot-S) tablet 1 tablet, 1 tablet, Oral, BID, Shawna Clamp, MD, 1 tablet at 08/19/22 2128   tamsulosin (FLOMAX) capsule 0.4 mg, 0.4 mg, Oral, Daily, Ivor Costa, MD, 0.4 mg at 08/19/22 4332    ALLERGIES   Flagyl [metronidazole]     REVIEW OF SYSTEMS    Review of Systems:  Gen:  Denies  fever, sweats, chills weigh loss  HEENT: Denies blurred vision, double vision, ear pain, eye pain, hearing loss, nose bleeds, sore throat Cardiac:  No dizziness, chest pain or heaviness, chest tightness,edema Resp:   reports dyspnea chronically  Gi: Denies swallowing difficulty, stomach pain, nausea or vomiting, diarrhea, constipation, bowel incontinence Gu:  Denies bladder incontinence, burning urine Ext:   Denies Joint pain, stiffness or swelling Skin: Denies  skin rash, easy bruising or bleeding or hives Endoc:  Denies polyuria, polydipsia , polyphagia or weight change Psych:   Denies depression, insomnia or hallucinations   Other:  All other systems negative   VS: BP 131/70 (BP  Location: Right Arm)   Pulse 83   Temp 98.2 F (36.8 C) (Oral)   Resp 20   Wt 72.6 kg Comment: recorded 06/2022  SpO2 100%   BMI 22.64 kg/m      PHYSICAL EXAM    GENERAL:NAD, no fevers, chills, no weakness no fatigue HEAD: Normocephalic, atraumatic.  EYES: Pupils equal, round, reactive to light. Extraocular muscles intact. No scleral icterus.  MOUTH: Moist mucosal membrane. Dentition intact. No abscess noted.  EAR, NOSE, THROAT: Clear without exudates. No external lesions.  NECK: Supple. No thyromegaly. No nodules. No JVD.  PULMONARY: decreased breath sounds with mild rhonchi worse at bases bilaterally.  CARDIOVASCULAR: S1 and S2. Regular rate and rhythm. No murmurs, rubs, or gallops. No edema. Pedal pulses 2+ bilaterally.  GASTROINTESTINAL: Soft, nontender, nondistended. No masses. Positive bowel sounds. No hepatosplenomegaly.  MUSCULOSKELETAL: No swelling, clubbing, or edema. Range of motion full in all extremities.  NEUROLOGIC: Cranial nerves II through XII are intact. No gross focal neurological deficits. Sensation intact. Reflexes intact.  SKIN: No ulceration, lesions, rashes, or cyanosis. Skin warm and dry. Turgor intact.  PSYCHIATRIC: Mood, affect within normal limits. The patient is awake, alert and oriented x 3. Insight, judgment intact.       IMAGING     ASSESSMENT/PLAN   Severe acute exacerbation of COPD -Present on admission due to viral RSV infection -Continue nebulizer therapy with Xopenex, will DC ipratropium due to arrhythmogenic potential,  -Have initiated Zithromax p.o. 250 mg, continue with prednisone currently at 35 mg. 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  Cardiology team is on board-appreciate input  Thank you for allowing me to participate in  the care of this patient.   Patient/Family are satisfied with care plan and all questions have been answered.    Provider disclosure: Patient with at least one acute or chronic illness or injury that poses a threat to life or bodily function and is being managed actively during this encounter.  All of the below services have been performed independently by signing provider:  review of prior documentation from internal and or external health records.  Review of previous and current lab results.  Interview and comprehensive assessment during patient visit today. Review of current and previous chest radiographs/CT scans. Discussion of management and test interpretation with health care team and patient/family.   This document was prepared using Dragon voice recognition software and may include unintentional dictation errors.     Ottie Glazier, M.D.  Division of Pulmonary & Critical Care Medicine

## 2022-08-20 NOTE — TOC Progression Note (Addendum)
Transition of Care (TOC) - Progression Note    Patient Details  Name: [name] [name]. MRN: [id number] Date of Birth: [birth year]  Transition of Care [facility]) CM/SW Contact  Laurena Slimmer, RN Phone Number: 08/20/2022, 12:27 PM  Clinical Narrative:    Damaris Schooner with Seth Bake from [facility]. Patient can be accepted today. MD notified.   12:58pm Patient notified of discharge to [facility].     Expected Discharge Plan: [place] Barriers to Discharge: Continued Medical Work up  Expected Discharge Plan and Services       Living arrangements for the past 2 months: [place]                                       Social Determinants of Health (SDOH) Interventions SDOH Screenings   Food Insecurity: No Food Insecurity (08/13/2022)  Housing: Low Risk  (08/13/2022)  Transportation Needs: No Transportation Needs (08/13/2022)  Utilities: Not At Risk (08/13/2022)  Depression (PHQ2-9): Low Risk  (07/05/2022)  Tobacco Use: Medium Risk (08/13/2022)    Readmission Risk Interventions     No data to display

## 2022-08-20 NOTE — Progress Notes (Signed)
EMS is here to transport the patient. Belongings sent with the patient

## 2022-08-20 NOTE — Discharge Instructions (Signed)
Advised to take Cardizem 180 mg twice daily for atrial fibrillation. Advised to take Eliquis 5 mg twice daily for anticoagulation. Advised to take Zithromax to 50 mg daily for 10 days. Advised to continue prednisone and wean by 5 mg daily until discontinued.

## 2022-08-20 NOTE — Telephone Encounter (Signed)
Order Review for Admission to SNF. Fill chronic insomnia. Taper for Prednisone Clarified. F/u w/ cardiology.

## 2022-08-20 NOTE — Progress Notes (Signed)
OT Cancellation Note  Patient Details Name: [name] [name]. MRN: [id number] DOB: [birth year]   Cancelled Treatment:    Reason Eval/Treat Not Completed: Patient declined, no reason specified  OT returned for follow up session as patient needed time to use bedpan earlier.  Patient politely declined, and stated he would rest until discharging later this afternoon.  Jeneen Montgomery, OTR/L 08/20/22, 1:49 PM

## 2022-08-20 NOTE — Discharge Summary (Signed)
Physician Discharge Summary  [name] [name]. PFX:[id number] DOB: April 18, 1941 DOA: 08/12/2022  PCP: [name], MD  Admit date: 08/12/2022  Discharge date: 08/20/2022  Admitted From: Home.  Disposition:  SNF [facility]  Recommendations for Outpatient Follow-up:  Follow up with PCP in 1-2 weeks. Please obtain BMP/CBC in one week. Advised to take Cardizem 180 mg twice daily for atrial fibrillation. Advised to take Eliquis 5 mg twice daily for anticoagulation. Advised to take Zithromax 250 mg daily for 6 days. Advised to continue prednisone and wean by 5 mg daily until discontinued. Advised to follow-up with cardiology for outpatient TEE with DCCV.  Home Health: None Equipment/Devices: Home oxygen  Discharge Condition: Stable CODE STATUS:Full code Diet recommendation: Heart Healthy   Brief [facility] Course: This 82 y.o. 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  01/14: Patient was found to have severe respiratory distress and severe tachycardia with heart rates up to 180s.  Found to have new onset atrial fibrillation.  He cannot speak in full sentence, using accessory muscle for breathing, initially started on CPAP but still has respiratory distress.  BiPAP is started in the ED. (+)PCR for RSV, BNP 61, lactic acid normal 1.7, WBC 13.6, GFR> 60, blood pressure 130/59, RR 28.  ABG with pH 7.32, CO2 68, O2 43.  Chest x-ray showed COPD without infiltration.  Patient was admitted to stepdown as inpatient. Later in the afternoon was transitioned to [place] O2 2L/min 01/15: maintaining on [place] O2 2-4L. HR improved. Cardizem gtt d/c overnight d/t bradycardia. Afib --> Aflutter. Remains on heparin gtt. Cardiology [facility]) evaluated the patient - "If remains symptomatic with shortness of breath and palpitations can consider TEE/DCCV"   01/16: remains significantly SOB, titrating rate control meds. 01/17: Heart rate is well-controlled.  Remains in atrial flutter.  Plan for DC cardioversion as an outpatient after acute illness resolves. 1/21: Evaluated by pulmonology started on Zithromax and other medications adjusted.  Patient reports feeling better. 1/22: Patient feels better and wants to be discharged, pulmonology in agreement.Patient being dischraged to SNF.  Discharge Diagnoses:  Principal Problem:   RSV bronchitis Active Problems:   Acute on chronic respiratory failure with hypoxia (HCC)   COPD exacerbation (HCC)   Atrial fibrillation with RVR (HCC)   Coronary artery disease   Myocardial injury   Hypertension   Hyperlipidemia   BPH (benign prostatic hyperplasia)   Anxiety   Atrial flutter (HCC)  Acute on chronic hypoxic respiratory failure, in the setting of RSV infection: RSV induced COPD exacerbation: Patient was found to be hypoxic requiring BiPAP on arrival.  Now weaned down to [place] Continue nebulized bronchodilators. Initiated on Solu-Medrol 40 mg IV bid, then transitioned to prednisone 40 mg daily Continue Mucinex for cough. Incentive spirometry. Continue Supplemental O2 - he is usually on 2-4L/min at home . He still significant SOB, still has hypoxia while ambulation. Pulmonology consulted.  Medication adjusted. Patient started on Zithromax for 5 days. Patient feels much improved.  Back to his baseline oxygen requirement. Continue prednisone taper until discontinued.   Persistent atrial fibrillation with RVR: Heart rate is now well-controlled, remains in A.Fibrillation. CHADS2 score is 4.  Started on heparin infusion. Patient had 2D echo 08/06/2022 which showed LVEF of 65 to 70% Cardizem drip discontinued. Continue Cardizem 180 mg twice daily IV heparin transitioned with Eliquis. Plan for  cardioversion as outpatient after acute illness resolves. Cardiology signed off.  Continue Eliquis.   Mild  volume overload: Resolved with 1 dose of IV Lasix.   Coronary artery disease.  ACS ruled out. Trend troponin --> flat  Continue Crestor D/c Plavix now that on Eliquis Discontinue aspirin.  Continue as needed nitroglycerin.   Hypertension: Hold Diovan , Continue Cardizem   Hyperlipidemia Continue Crestor.   BPH (benign prostatic hyperplasia) Continue Proscar and Flomax.   Anxiety Continue As needed Klonopin.  Discharge Instructions  Discharge Instructions     Call MD for:  difficulty breathing, headache or visual disturbances   Complete by: As directed    Call MD for:  persistant nausea and vomiting   Complete by: As directed    Diet - low sodium heart healthy   Complete by: As directed    Diet - low sodium heart healthy   Complete by: As directed    Diet Carb Modified   Complete by: As directed    Discharge instructions   Complete by: As directed    Advised to take Cardizem 180 mg twice daily for atrial fibrillation. Advised to take Eliquis 5 mg twice daily for anticoagulation. Advised to take Zithromax to 50 mg daily for 10 days. Advised to continue prednisone and wean by 5 mg daily until discontinued.   Increase activity slowly   Complete by: As directed    Increase activity slowly   Complete by: As directed       Allergies as of 08/20/2022       Reactions   Flagyl [metronidazole] Other (See Comments)   Other reaction(s): skin reaction        Medication List     STOP taking these medications    clopidogrel 75 MG tablet Commonly known as: PLAVIX   levofloxacin 500 MG tablet Commonly known as: LEVAQUIN   valsartan 80 MG tablet Commonly known as: DIOVAN       TAKE these medications    albuterol 108 (90 Base) MCG/ACT inhaler Commonly known as: VENTOLIN HFA Inhale 2 puffs into the lungs 3 (three) times daily as needed for wheezing or shortness of breath.   albuterol (2.5 MG/3ML) 0.083% nebulizer solution Commonly known as: PROVENTIL Take 3  mLs (2.5 mg total) by nebulization every 6 (six) hours as needed for wheezing or shortness of breath.   ammonium lactate 12 % cream Commonly known as: AMLACTIN Apply topically at bedtime.   apixaban 5 MG Tabs tablet Commonly known as: ELIQUIS Take 1 tablet (5 mg total) by mouth 2 (two) times daily.   azithromycin 250 MG tablet Commonly known as: ZITHROMAX Take 1 tablet (250 mg total) by mouth daily for 6 days. Vies to take daily   Breztri Aerosphere 160-9-4.8 MCG/ACT Aero Generic drug: Budeson-Glycopyrrol-Formoterol Inhale 2 puffs into the lungs 2 (two) times daily.   clonazePAM 0.5 MG tablet Commonly known as: KLONOPIN Take 0.5 mg by mouth daily as needed for anxiety.   diltiazem 180 MG 24 hr capsule Commonly known as: CARDIZEM CD Take 1 capsule (180 mg total) by mouth 2 (two) times daily.   finasteride 5 MG tablet Commonly known as: PROSCAR Take 5 mg by mouth daily.   furosemide 20 MG tablet Commonly known as: LASIX Take 20 mg by mouth daily.   gabapentin 100 MG capsule Commonly known as: NEURONTIN Take 100 mg by mouth 2 (two) times daily.   ketoconazole 2 % cream Commonly known as: NIZORAL Apply topically 2 (two) times daily.  Melatonin 5 MG Caps Take 5 mg by mouth at bedtime.   nitroGLYCERIN 0.4 MG SL tablet Commonly known as: NITROSTAT Place 1 tablet (0.4 mg total) under the tongue every 5 (five) minutes as needed for chest pain.   OXYGEN Inhale into the lungs. 2-3 liters upon exertion and 2 liters continuous at night.   pantoprazole 40 MG tablet Commonly known as: PROTONIX Take 40 mg by mouth daily.   polyethylene glycol powder 17 GM/SCOOP powder Commonly known as: GLYCOLAX/MIRALAX Take 255 g by mouth daily. What changed:  when to take this reasons to take this   predniSONE 10 MG tablet Commonly known as: DELTASONE Advised to wean prednisone by 5 mg daily and then dc. Start taking on: August 21, 2022 What changed:  how much to take how to  take this when to take this additional instructions Another medication with the same name was removed. Continue taking this medication, and follow the directions you see here.   rosuvastatin 20 MG tablet Commonly known as: CRESTOR Take 1 tablet (20 mg total) by mouth at bedtime.   tamsulosin 0.4 MG Caps capsule Commonly known as: FLOMAX TAKE 1 CAPSULE BY MOUTH EVERY DAY        Contact information for follow-up providers     Viviana Simpler I, MD Follow up in 1 week(s).   Specialties: Internal Medicine, Pediatrics Why: 08/28/22 at 12:15 PM Contact information: [place] [address] [phone]         Minna Merritts, MD Follow up in 1 week(s).   Specialty: Cardiology Why: patient to call and schedule. Contact information: [place] [address] [phone]              Contact information for after-discharge care     Destination     [facility] .   Service: Skilled Nursing Contact information: [place] [address] [phone]                    Allergies  Allergen Reactions   Flagyl [Metronidazole] Other (See Comments)    Other reaction(s): skin reaction    Consultations: Pulmonology Cardiology   Procedures/Studies: [facility] Chest Port 1 View  Result Date: 08/18/2022 CLINICAL DATA:  Shortness of breath. EXAM: PORTABLE CHEST 1 VIEW COMPARISON:  08/17/2022, 08/17/2022 and 06/18/2022 FINDINGS: Lungs are adequately inflated with mild emphysematous disease over the upper lungs. Minimal stable prominence of the central bronchovascular markings which may be due to minimal vascular congestion versus viral bronchopneumonia. No lobar airspace consolidation or effusion. No pneumothorax. Cardiomediastinal silhouette and remainder of the exam is unchanged. IMPRESSION: 1. Minimal stable prominence of the central bronchovascular markings which may be due to  minimal vascular congestion versus viral bronchopneumonia. 2. Emphysematous disease. Electronically Signed   By: Marin Olp M.D.   On: 08/18/2022 14:37   DG Chest Port 1 View  Result Date: 08/17/2022 CLINICAL DATA:  Shortness of breath starting this morning. History of COPD. EXAM: PORTABLE CHEST 1 VIEW COMPARISON:  08/12/2022 and CT chest 03/07/2022 FINDINGS: Tapering of the peripheral pulmonary vasculature favors emphysema. Heart size within normal limits. Stable scarring in the right mid lung. No edema observed. Mild chronic interstitial accentuation. Airway thickening is present, suggesting bronchitis or reactive airways disease. Bilateral degenerative AC joint spurring. IMPRESSION: 1. Airway thickening is present, suggesting bronchitis or reactive airways disease. 2. Stable scarring in the right mid lung. 3. Emphysema. 4. Mild  chronic interstitial accentuation. Electronically Signed   By: Van Clines M.D.   On: 08/17/2022 10:10   DG Chest Port 1 View  Result Date: 08/12/2022 CLINICAL DATA:  Shortness of breath. EXAM: PORTABLE CHEST 1 VIEW COMPARISON:  Two-view chest x-ray 07/18/2022 FINDINGS: Heart size is normal. Changes of COPD again noted. No superimposed airspace disease or edema is present. No significant effusions are present. Overlying defibrillator pads and leads are noted. IMPRESSION: 1. No acute cardiopulmonary disease. 2. COPD. Electronically Signed   By: San Morelle M.D.   On: 08/12/2022 12:54   ECHOCARDIOGRAM COMPLETE  Result Date: 08/07/2022    ECHOCARDIOGRAM REPORT   Patient Name:   Razi Hickle. Date of Exam: 08/06/2022 Medical Rec #:  [id number]            Height:       70.5 in Accession #:    [id number]           Weight:       160.0 lb Date of Birth:  08-12-1940            BSA:          1.909 m Patient Age:    82 years             BP:           110/60 mmHg Patient Gender: M                    HR:           117 bpm. Exam Location:  [facility] Procedure: 2D  Echo, Cardiac Doppler and Color Doppler Indications:    R06.00 Dyspnea  History:        Patient has no prior history of Echocardiogram examinations.                 CAD, COPD; Risk Factors:Former Smoker, Hypertension and                 Dyslipidemia. Dyspnea on exertion. Oxygen dependent.  Sonographer:    Diamond Nickel RCS Referring Phys: [place]  1. Tachycardia noted. Left ventricular ejection fraction, by estimation, is 65 to 70%. The left ventricle has normal function. The left ventricle has no regional wall motion abnormalities. There is mild left ventricular hypertrophy. Left ventricular diastolic parameters are indeterminate. There is the interventricular septum is flattened in systole and diastole, consistent with right ventricular pressure and volume overload.  2. Right ventricular systolic function is normal. The right ventricular size is normal. Tricuspid regurgitation signal is inadequate for assessing PA pressure.  3. The mitral valve is normal in structure. No evidence of mitral valve regurgitation. No evidence of mitral stenosis.  4. The aortic valve is normal in structure. Aortic valve regurgitation is not visualized. No aortic stenosis is present.  5. The inferior vena cava is normal in size with greater than 50% respiratory variability, suggesting right atrial pressure of 3 mmHg. FINDINGS  Left Ventricle: Tachycardia noted. Left ventricular ejection fraction, by estimation, is 65 to 70%. The left ventricle has normal function. The left ventricle has no regional wall motion abnormalities. The left ventricular internal cavity size was normal in size. There is mild left ventricular hypertrophy. The interventricular septum is flattened in systole and diastole, consistent with right ventricular pressure and volume overload. Left ventricular diastolic parameters are indeterminate. Right Ventricle: The right ventricular size is normal. No increase in right ventricular wall  thickness. Right ventricular  systolic function is normal. Tricuspid regurgitation signal is inadequate for assessing PA pressure. Left Atrium: Left atrial size was normal in size. Right Atrium: Right atrial size was normal in size. Pericardium: There is no evidence of pericardial effusion. Mitral Valve: The mitral valve is normal in structure. No evidence of mitral valve regurgitation. No evidence of mitral valve stenosis. Tricuspid Valve: The tricuspid valve is normal in structure. Tricuspid valve regurgitation is not demonstrated. No evidence of tricuspid stenosis. Aortic Valve: The aortic valve is normal in structure. Aortic valve regurgitation is not visualized. No aortic stenosis is present. Pulmonic Valve: The pulmonic valve was normal in structure. Pulmonic valve regurgitation is not visualized. No evidence of pulmonic stenosis. Aorta: The aortic root is normal in size and structure. Venous: The inferior vena cava is normal in size with greater than 50% respiratory variability, suggesting right atrial pressure of 3 mmHg. IAS/Shunts: No atrial level shunt detected by color flow Doppler.  LEFT VENTRICLE PLAX 2D LVIDd:         3.40 cm LVIDs:         2.00 cm LV PW:         1.30 cm LV IVS:        1.10 cm LVOT diam:     2.30 cm LV SV:         75 LV SV Index:   40 LVOT Area:     4.15 cm  RIGHT VENTRICLE RV Basal diam:  2.20 cm RV S prime:     13.67 cm/s TAPSE (M-mode): 2.0 cm LEFT ATRIUM             Index        RIGHT ATRIUM          Index LA diam:        2.90 cm 1.52 cm/m   RA Area:     9.20 cm LA Vol (A2C):   47.1 ml 24.68 ml/m  RA Volume:   15.20 ml 7.96 ml/m LA Vol (A4C):   25.2 ml 13.20 ml/m LA Biplane Vol: 35.4 ml 18.55 ml/m  AORTIC VALVE LVOT Vmax:   107.13 cm/s LVOT Vmean:  75.233 cm/s LVOT VTI:    0.182 m  AORTA Ao Root diam: 3.70 cm Ao Asc diam:  3.90 cm  SHUNTS Systemic VTI:  0.18 m Systemic Diam: 2.30 cm Candee Furbish MD Electronically signed by Candee Furbish MD Signature Date/Time: 08/07/2022/10:13:56  AM    Final     Subjective: Patient was seen and examined at bedside.  Overnight events noted.   Patient report doing much better,  Patient wants to be discharged to skilled nursing facility for rehab. Pulmonology is in agreement.  Discharge Exam: Vitals:   08/20/22 0534 08/20/22 0838  BP:  134/72  Pulse:  85  Resp:  16  Temp:  97.9 F (36.6 C)  SpO2: 100% 100%   Vitals:   08/19/22 2101 08/20/22 0509 08/20/22 0534 08/20/22 0838  BP: (!) 143/63 131/70  134/72  Pulse: 86 83  85  Resp: '20 20  16  '$ Temp: 98 F (36.7 C) 98.2 F (36.8 C)  97.9 F (36.6 C)  TempSrc: Oral Oral  Oral  SpO2: 100% 100% 100% 100%  Weight:        General: Pt is alert, awake, not in acute distress Cardiovascular: RRR, S1/S2 +, no rubs, no gallops Respiratory: CTA bilaterally, no wheezing, no rhonchi Abdominal: Soft, NT, ND, bowel sounds + Extremities: no edema, no cyanosis  The results of significant diagnostics from this hospitalization (including imaging, microbiology, ancillary and laboratory) are listed below for reference.     Microbiology: Recent Results (from the past 240 hour(s))  Resp panel by RT-PCR (RSV, Flu A&B, Covid) Anterior Nasal Swab     Status: Abnormal   Collection Time: 08/12/22 12:27 PM   Specimen: Anterior Nasal Swab  Result Value Ref Range Status   SARS Coronavirus 2 by RT PCR NEGATIVE NEGATIVE Final    Comment: (NOTE) SARS-CoV-2 target nucleic acids are NOT DETECTED.  The SARS-CoV-2 RNA is generally detectable in upper respiratory specimens during the acute phase of infection. The lowest concentration of SARS-CoV-2 viral copies this assay can detect is 138 copies/mL. A negative result does not preclude SARS-Cov-2 infection and should not be used as the sole basis for treatment or other patient management decisions. A negative result may occur with  improper specimen collection/handling, submission of specimen other than nasopharyngeal swab, presence of viral  mutation(s) within the areas targeted by this assay, and inadequate number of viral copies(<138 copies/mL). A negative result must be combined with clinical observations, patient history, and epidemiological information. The expected result is Negative.  Fact Sheet for Patients:  EntrepreneurPulse.com.au  Fact Sheet for Healthcare Providers:  IncredibleEmployment.be  This test is no t yet approved or cleared by the [place] FDA and  has been authorized for detection and/or diagnosis of SARS-CoV-2 by FDA under an Emergency Use Authorization (EUA). This EUA will remain  in effect (meaning this test can be used) for the duration of the COVID-19 declaration under Section 564(b)(1) of the Act, 21 U.S.C.section 360bbb-3(b)(1), unless the authorization is terminated  or revoked sooner.       Influenza A by PCR NEGATIVE NEGATIVE Final   Influenza B by PCR NEGATIVE NEGATIVE Final    Comment: (NOTE) The Xpert Xpress SARS-CoV-2/FLU/RSV plus assay is intended as an aid in the diagnosis of influenza from Nasopharyngeal swab specimens and should not be used as a sole basis for treatment. Nasal washings and aspirates are unacceptable for Xpert Xpress SARS-CoV-2/FLU/RSV testing.  Fact Sheet for Patients: EntrepreneurPulse.com.au  Fact Sheet for Healthcare Providers: IncredibleEmployment.be  This test is not yet approved or cleared by the [place] FDA and has been authorized for detection and/or diagnosis of SARS-CoV-2 by FDA under an Emergency Use Authorization (EUA). This EUA will remain in effect (meaning this test can be used) for the duration of the COVID-19 declaration under Section 564(b)(1) of the Act, 21 U.S.C. section 360bbb-3(b)(1), unless the authorization is terminated or revoked.     Resp Syncytial Virus by PCR POSITIVE (A) NEGATIVE Final    Comment: (NOTE) Fact Sheet for  Patients: EntrepreneurPulse.com.au  Fact Sheet for Healthcare Providers: IncredibleEmployment.be  This test is not yet approved or cleared by the [place] FDA and has been authorized for detection and/or diagnosis of SARS-CoV-2 by FDA under an Emergency Use Authorization (EUA). This EUA will remain in effect (meaning this test can be used) for the duration of the COVID-19 declaration under Section 564(b)(1) of the Act, 21 U.S.C. section 360bbb-3(b)(1), unless the authorization is terminated or revoked.  Performed at [facility], [address] [place], [place] [address]   Expectorated Sputum Assessment w Gram Stain, Rflx to Resp Cult     Status: None   Collection Time: 08/12/22  4:39 PM   Specimen: Sputum  Result Value Ref Range Status   Specimen Description SPUTUM  Final   Special Requests NONE  Final  Sputum evaluation   Final    Sputum specimen not acceptable for testing.  Please recollect.   NOTIFIED ASHLEY TREXLER ON 08/12/22 AT 1735 QSD Performed at [facility], [address] [place], [place] [address]    Report Status 08/12/2022 FINAL  Final  Expectorated Sputum Assessment w Gram Stain, Rflx to Resp Cult     Status: None   Collection Time: 08/13/22 10:52 AM   Specimen: Sputum  Result Value Ref Range Status   Specimen Description SPU  Final   Special Requests SPU  Final   Sputum evaluation   Final    THIS SPECIMEN IS ACCEPTABLE FOR SPUTUM CULTURE Performed at [facility], [address], [place], [place] [address]    Report Status 08/13/2022 FINAL  Final  Culture, Respiratory w Gram Stain     Status: None   Collection Time: 08/13/22 10:52 AM   Specimen: Sputum  Result Value Ref Range Status   Specimen Description   Final    SPU Performed at [facility], [address], [place], [place] [address]    Special Requests   Final    SPU Reflexed from [id number] Performed at [facility], [address] [place], [place] [address]    Gram Stain   Final    RARE WBC PRESENT, PREDOMINANTLY PMN MODERATE GRAM POSITIVE COCCI IN CHAINS FEW GRAM NEGATIVE RODS FEW YEAST PREVIOUSLY REPORTED AS: MODERATE GRAM NEGATIVE RODS MODERATE WBC PRESENT, PREDOMINANTLY PMN CORRECTED RESULTS CALLED TO:  C/  A. DAVIS, RN 08/13/22 2023 A. LAFRANCE    Culture   Final    Normal respiratory flora-no Staph aureus or Pseudomonas seen Performed at [facility] [address], [place],  [address]    Report Status 08/16/2022 FINAL  Final     Labs: BNP (last 3 results) Recent Labs    08/12/22 1227  BNP 29.9   Basic Metabolic Panel: Recent Labs  Lab 08/14/22 0501 08/15/22 0514 08/16/22 0146 08/19/22 0637 08/20/22 0908  NA 136 135 136 137  --   K 4.4 4.4 4.8 5.1 4.6  CL 100 99 99 99  --   CO2 28 29 32 36*  --   GLUCOSE 156* 144* 153* 119*  --   BUN 35* 41* 47* 32*  --   CREATININE 1.11 1.04 1.10 0.89  --   CALCIUM 8.3* 8.3* 8.4* 8.3*  --   MG  --   --  2.4  --   --   PHOS  --   --  4.1  --   --    Liver Function Tests: No results for input(s): "AST", "ALT", "ALKPHOS", "BILITOT", "PROT", "ALBUMIN" in the last 168 hours. No results for input(s): "LIPASE", "AMYLASE" in the last 168 hours. No results for input(s): "AMMONIA" in the last 168 hours. CBC: Recent Labs  Lab 08/14/22 0501 08/16/22 0146 08/19/22 0637  WBC 11.7* 10.8* 13.2*  HGB 10.9* 11.0* 11.4*  HCT 33.4* 33.4* 35.4*  MCV 95.7 94.9 96.7  PLT 255 260 230   Cardiac Enzymes: No results for input(s): "CKTOTAL", "CKMB", "CKMBINDEX", "TROPONINI" in the last 168 hours. BNP: Invalid input(s): "POCBNP" CBG: No results for input(s): "GLUCAP" in the last 168 hours. D-Dimer No results for input(s): "DDIMER" in the last 72 hours. Hgb A1c No results for input(s): "HGBA1C" in the last 72 hours. Lipid Profile No results for input(s): "CHOL", "HDL", "LDLCALC", "TRIG", "CHOLHDL", "LDLDIRECT" in  the last 72 hours. Thyroid function studies No  results for input(s): "TSH", "T4TOTAL", "T3FREE", "THYROIDAB" in the last 72 hours.  Invalid input(s): "FREET3" Anemia work up No results for input(s): "VITAMINB12", "FOLATE", "FERRITIN", "TIBC", "IRON", "RETICCTPCT" in the last 72 hours. Urinalysis    Component Value Date/Time   COLORURINE YELLOW 11/16/2009 1130   APPEARANCEUR CLEAR 11/16/2009 1130   LABSPEC 1.017 11/16/2009 1130   PHURINE 7.5 11/16/2009 1130   GLUCOSEU NEGATIVE 11/16/2009 1130   HGBUR NEGATIVE 11/16/2009 1130   BILIRUBINUR NEGATIVE 11/16/2009 1130   KETONESUR NEGATIVE 11/16/2009 1130   PROTEINUR NEGATIVE 11/16/2009 1130   UROBILINOGEN 0.2 11/16/2009 1130   NITRITE NEGATIVE 11/16/2009 1130   LEUKOCYTESUR  11/16/2009 1130    NEGATIVE MICROSCOPIC NOT DONE ON URINES WITH NEGATIVE PROTEIN, BLOOD, LEUKOCYTES, NITRITE, OR GLUCOSE <1000 mg/dL.   Sepsis Labs Recent Labs  Lab 08/14/22 0501 08/16/22 0146 08/19/22 0637  WBC 11.7* 10.8* 13.2*   Microbiology Recent Results (from the past 240 hour(s))  Resp panel by RT-PCR (RSV, Flu A&B, Covid) Anterior Nasal Swab     Status: Abnormal   Collection Time: 08/12/22 12:27 PM   Specimen: Anterior Nasal Swab  Result Value Ref Range Status   SARS Coronavirus 2 by RT PCR NEGATIVE NEGATIVE Final    Comment: (NOTE) SARS-CoV-2 target nucleic acids are NOT DETECTED.  The SARS-CoV-2 RNA is generally detectable in upper respiratory specimens during the acute phase of infection. The lowest concentration of SARS-CoV-2 viral copies this assay can detect is 138 copies/mL. A negative result does not preclude SARS-Cov-2 infection and should not be used as the sole basis for treatment or other patient management decisions. A negative result may occur with  improper specimen collection/handling, submission of specimen other than nasopharyngeal swab, presence of viral mutation(s) within the areas targeted by this assay, and inadequate  number of viral copies(<138 copies/mL). A negative result must be combined with clinical observations, patient history, and epidemiological information. The expected result is Negative.  Fact Sheet for Patients:  EntrepreneurPulse.com.au  Fact Sheet for Healthcare Providers:  IncredibleEmployment.be  This test is no t yet approved or cleared by the [place] FDA and  has been authorized for detection and/or diagnosis of SARS-CoV-2 by FDA under an Emergency Use Authorization (EUA). This EUA will remain  in effect (meaning this test can be used) for the duration of the COVID-19 declaration under Section 564(b)(1) of the Act, 21 U.S.C.section 360bbb-3(b)(1), unless the authorization is terminated  or revoked sooner.       Influenza A by PCR NEGATIVE NEGATIVE Final   Influenza B by PCR NEGATIVE NEGATIVE Final    Comment: (NOTE) The Xpert Xpress SARS-CoV-2/FLU/RSV plus assay is intended as an aid in the diagnosis of influenza from Nasopharyngeal swab specimens and should not be used as a sole basis for treatment. Nasal washings and aspirates are unacceptable for Xpert Xpress SARS-CoV-2/FLU/RSV testing.  Fact Sheet for Patients: EntrepreneurPulse.com.au  Fact Sheet for Healthcare Providers: IncredibleEmployment.be  This test is not yet approved or cleared by the [place] FDA and has been authorized for detection and/or diagnosis of SARS-CoV-2 by FDA under an Emergency Use Authorization (EUA). This EUA will remain in effect (meaning this test can be used) for the duration of the COVID-19 declaration under Section 564(b)(1) of the Act, 21 U.S.C. section 360bbb-3(b)(1), unless the authorization is terminated or revoked.     Resp Syncytial Virus by PCR POSITIVE (A) NEGATIVE Final    Comment: (NOTE) Fact Sheet for Patients: EntrepreneurPulse.com.au  Fact Sheet for Healthcare  Providers: IncredibleEmployment.be  This test is not yet approved or cleared by the [place] and has been authorized for detection and/or diagnosis of SARS-CoV-2 by FDA under an Emergency Use Authorization (EUA). This EUA will remain in effect (meaning this test can be used) for the duration of the COVID-19 declaration under Section 564(b)(1) of the Act, 21 U.S.C. section 360bbb-3(b)(1), unless the authorization is terminated or revoked.  Performed at [facility], [address] [place], [place] [address]   Expectorated Sputum Assessment w Gram Stain, Rflx to Resp Cult     Status: None   Collection Time: 08/12/22  4:39 PM   Specimen: Sputum  Result Value Ref Range Status   Specimen Description SPUTUM  Final   Special Requests NONE  Final   Sputum evaluation   Final    Sputum specimen not acceptable for testing.  Please recollect.   NOTIFIED ASHLEY TREXLER ON 08/12/22 AT 1735 QSD Performed at [facility], [address] [place], [place] [address]    Report Status 08/12/2022 FINAL  Final  Expectorated Sputum Assessment w Gram Stain, Rflx to Resp Cult     Status: None   Collection Time: 08/13/22 10:52 AM   Specimen: Sputum  Result Value Ref Range Status   Specimen Description SPU  Final   Special Requests SPU  Final   Sputum evaluation   Final    THIS SPECIMEN IS ACCEPTABLE FOR SPUTUM CULTURE Performed at [facility], [address], [place], [place] [address]    Report Status 08/13/2022 FINAL  Final  Culture, Respiratory w Gram Stain     Status: None   Collection Time: 08/13/22 10:52 AM   Specimen: Sputum  Result Value Ref Range Status   Specimen Description   Final    SPU Performed at [facility], [address], [place], [place] [address]    Special Requests   Final    SPU Reflexed from [id number] Performed at [facility], [address] [place], [place] [address]    Gram Stain   Final     RARE WBC PRESENT, PREDOMINANTLY PMN MODERATE GRAM POSITIVE COCCI IN CHAINS FEW GRAM NEGATIVE RODS FEW YEAST PREVIOUSLY REPORTED AS: MODERATE GRAM NEGATIVE RODS MODERATE WBC PRESENT, PREDOMINANTLY PMN CORRECTED RESULTS CALLED TO:  C/  A. DAVIS, RN 08/13/22 2023 A. LAFRANCE    Culture   Final    Normal respiratory flora-no Staph aureus or Pseudomonas seen Performed at [facility] [address], [place], [place] [address]    Report Status 08/16/2022 FINAL  Final     Time coordinating discharge: Over 30 minutes  SIGNED:   Shawna Clamp, MD  [facility] 08/20/2022, 1:56 PM Pager   If 7PM-7AM, please contact night-coverage

## 2022-08-22 ENCOUNTER — Non-Acute Institutional Stay (SKILLED_NURSING_FACILITY): Payer: Medicare Other | Admitting: Student

## 2022-08-22 ENCOUNTER — Encounter: Payer: Self-pay | Admitting: Student

## 2022-08-22 DIAGNOSIS — J431 Panlobular emphysema: Secondary | ICD-10-CM

## 2022-08-22 DIAGNOSIS — N400 Enlarged prostate without lower urinary tract symptoms: Secondary | ICD-10-CM

## 2022-08-22 DIAGNOSIS — I10 Essential (primary) hypertension: Secondary | ICD-10-CM | POA: Diagnosis not present

## 2022-08-22 DIAGNOSIS — J9611 Chronic respiratory failure with hypoxia: Secondary | ICD-10-CM | POA: Diagnosis not present

## 2022-08-22 DIAGNOSIS — J441 Chronic obstructive pulmonary disease with (acute) exacerbation: Secondary | ICD-10-CM

## 2022-08-22 DIAGNOSIS — I4892 Unspecified atrial flutter: Secondary | ICD-10-CM | POA: Diagnosis not present

## 2022-08-22 DIAGNOSIS — I4891 Unspecified atrial fibrillation: Secondary | ICD-10-CM | POA: Diagnosis not present

## 2022-08-22 DIAGNOSIS — I739 Peripheral vascular disease, unspecified: Secondary | ICD-10-CM

## 2022-08-22 DIAGNOSIS — G479 Sleep disorder, unspecified: Secondary | ICD-10-CM | POA: Diagnosis not present

## 2022-08-22 NOTE — Progress Notes (Signed)
Provider:  Dr. Dewayne Shorter Location:  Other [place].  Nursing Home Room Number: [facility] 120A Place of Service:  SNF (31)  PCP: [name], MD Patient Care Team: [name], MD as PCP - General (Internal Medicine) Revankar, Reita Cliche, MD as PCP - Cardiology (Cardiology)  Extended Emergency Contact Information Primary Emergency Contact: Blevens,Susan Address: [place], [place] [address] Johnnette Litter of [place] Phone: [phone] Relation: Spouse  Code Status: Full Code  Goals of Care: Advanced Directive information    08/22/2022    8:49 AM  Advanced Directives  Does Patient Have a Medical Advance Directive? Yes  Type of Advance Directive Out of facility DNR (pink MOST or yellow form)  Does patient want to make changes to medical advance directive? No - Patient declined      Chief Complaint  Patient presents with  . New Admit To SNF    New Admission.     HPI: Patient is a 82 y.o. male seen today for admission to  Until about a month ago he has decent strength Got himself dressed. He has been on oxygen supplement for a few weeks now. 6 months with continuous oxygen. O2 ranges 2-4. Drives a car. Uses a battery scooter to get around. Walks the dog with the scooter. He was managing independently. Drove to [place], but doesn't get out of the car. The last 2 weeks he has had to take more rest-- such as getting socks on and resting in between. Taking 1 hour to get ready. The worst part of his week is getting showered. It drains his energy. Good appetite. 4 years ago he had 38% capacity of his lungs. He plays bridge sits and watches TV. Leaves with wife and dog, Lurena Joiner. No falls in the last year. He has noted a decline in the last year. He needs more help. He has needed wheelchair more often than before.   Home Healthcare to come and evaluate to help support him at home for a few hours of support.   The day he went to the hospital he  couldn't move and do his daily function. Called EMT who transferred him to [facility]. Received steroids for RSV.   He didn't know he had a-fib until they did the echocardiogram.   Every day he feels like he is breathing better. He feels like he would like to be able to get dressed wihtout difficulty. But he is okay if he has assistance of Homecare.   He has used steroids for many years. He has had tapers, he was wondering why he couldn't keep presnisone. He has been on 5 mg for several years. Each time he has a flare up he increases the prednisone.   Past Medical History:  Diagnosis Date  . Abdominal discomfort 12/07/2020  . Acquired trigger finger of right middle finger 02/04/2020  . Acute prostatitis 04/08/2018  . Alcohol abuse   . Alcohol dependence ([place]) 12/07/2020  . Anal or rectal pain 04/08/2018  . Annual physical exam 12/07/2020  . Anorectal disorder 12/07/2020  . Arthritis   . Atherosclerotic heart disease of native coronary artery without angina pectoris 04/08/2018  . Benign prostatic hyperplasia with lower urinary tract symptoms 12/07/2020  . BPH with elevated PSA   . 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 Chronic obstructive pulmonary disease with (acute) exacerbation ([place]) 12/07/2020  . Chronic respiratory failure with hypoxia (  [place]) 08/10/2019  . Chronic sinusitis 12/07/2020  . Cigarette nicotine dependence, uncomplicated 61/44/3154  . Coagulation disorder ([place]) 01/13/2019  . 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 Contracture of palmar fascia 12/07/2020  . COPD (chronic obstructive pulmonary disease) ([place])   . Corn of toe 12/07/2020  . Corns and callosities 04/09/2018  . Coronary artery disease    2019 with stents  . Coronary atherosclerosis 05/07/2018  . Degenerative disc disease, 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  . Degenerative disc disease, 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  . Diarrhea 04/09/2018  . Double vision with both eyes open  12/07/2020  . 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 Dysphagia 08/14/2018  . Dyspnea   . ED (erectile dysfunction)   . ED (erectile dysfunction) of organic origin 12/07/2020  . Elevated prostate specific antigen (PSA) 04/08/2018  . Elevated PSA   . Encounter for screening for other disorder 12/07/2020  . Enlarged prostate without lower urinary tract symptoms (luts) 04/09/2018  . Enthesopathy 12/07/2020  . Essential (primary) hypertension 04/08/2018  . Ex-smoker 04/09/2018  . Exertional dyspnea 04/02/2018  . 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 Flatulence 04/08/2018  . Gastro-esophageal reflux disease without esophagitis 04/08/2018  . GERD (gastroesophageal reflux disease)   . History of acute otitis externa 08/14/2018  . Hyperlipidemia   . Hypertension   . Hypoxemia 12/07/2020  . Kidney cysts   . Left knee pain 12/07/2020  . Low back pain 04/08/2018  . Male erectile dysfunction 04/09/2018  . Mixed hyperlipidemia 04/08/2018  . Nocturia more than twice per night 04/09/2018  . Osteoarthritis of first carpometacarpal joint 04/08/2018  . Osteoarthritis of knee 12/07/2020  . Other long term (current) drug therapy 12/07/2020  . Pain due to onychomycosis of toenail of left foot 07/20/2019  . Pain in joint 04/09/2018  . Pain in knee 04/09/2018  . Pain in unspecified knee 12/07/2020  . Pain of finger 04/09/2018  . Palpitations   . 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[place]) 12/07/2020  . Personal history of colonic polyps 12/07/2020  . Pneumonia   . Pneumothorax 04/12/2020  . Polypharmacy 04/09/2018  . Porokeratosis 01/13/2019  . 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[place])   . 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 Pulmonary nodules 06/08/2016  . Pure hypercholesterolemia 12/07/2020  . Renal cyst 12/07/2020  . Sleep disorder 04/08/2018  . Status post bronchoscopy 04/12/2020  . Tear of rotator cuff 04/09/2018  . Tobacco user 12/07/2020  . Uncomplicated alcohol dependence ([place]) 04/09/2018   . Unspecified rotator cuff tear or rupture of unspecified shoulder, not specified as traumatic 12/07/2020  . Unspecified tear of unspecified meniscus, current injury, unspecified knee, initial encounter 12/07/2020  . Visual disturbance 12/07/2020  . Vitamin D deficiency 04/08/2018   Past Surgical History:  Procedure Laterality Date  . BRONCHIAL BIOPSY  10/13/2019   Procedure: BRONCHIAL BIOPSIES;  Surgeon: Collene Gobble, MD;  Location: [facility] ENDOSCOPY;  Service: Pulmonary;;  . BRONCHIAL BIOPSY  04/12/2020   Procedure: BRONCHIAL BIOPSIES;  Surgeon: Collene Gobble, MD;  Location: [facility] ENDOSCOPY;  Service: Pulmonary;;  . BRONCHIAL BRUSHINGS  10/13/2019   Procedure: BRONCHIAL BRUSHINGS;  Surgeon: Collene Gobble, MD;  Location: [facility] ENDOSCOPY;  Service: Pulmonary;;  . BRONCHIAL BRUSHINGS  04/12/2020   Procedure: BRONCHIAL BRUSHINGS;  Surgeon: Collene Gobble, MD;  Location: [facility] ENDOSCOPY;  Service: Pulmonary;;  . BRONCHIAL NEEDLE ASPIRATION BIOPSY  10/13/2019   Procedure: BRONCHIAL NEEDLE ASPIRATION BIOPSIES;  Surgeon: Collene Gobble, MD;  Location: [facility] ENDOSCOPY;  Service: Pulmonary;;  . BRONCHIAL  NEEDLE ASPIRATION BIOPSY  04/12/2020   Procedure: BRONCHIAL NEEDLE ASPIRATION BIOPSIES;  Surgeon: Collene Gobble, MD;  Location: [place];  Service: Pulmonary;;  . BRONCHIAL WASHINGS  10/13/2019   Procedure: BRONCHIAL WASHINGS;  Surgeon: Collene Gobble, MD;  Location: [facility] ENDOSCOPY;  Service: Pulmonary;;  . BRONCHIAL WASHINGS  04/12/2020   Procedure: BRONCHIAL WASHINGS;  Surgeon: Collene Gobble, MD;  Location: [facility] ENDOSCOPY;  Service: Pulmonary;;  . CARDIAC CATHETERIZATION  2002   50% RCA  . CATARACT EXTRACTION, BILATERAL    . CORONARY STENT INTERVENTION N/A 04/15/2018   Procedure: CORONARY STENT INTERVENTION;  Surgeon: Jettie Booze, MD;  Location: [place] CV LAB;  Service: Cardiovascular;  Laterality: N/A;  om1  . EYE SURGERY    . KNEE ARTHROSCOPY    . LEFT HEART CATH AND CORONARY  ANGIOGRAPHY N/A 04/15/2018   Procedure: LEFT HEART CATH AND CORONARY ANGIOGRAPHY;  Surgeon: Jettie Booze, MD;  Location: [place] CV LAB;  Service: Cardiovascular;  Laterality: N/A;  . MULTIPLE TOOTH EXTRACTIONS    . TONSILLECTOMY    . VIDEO BRONCHOSCOPY  04/12/2020  . VIDEO BRONCHOSCOPY WITH ENDOBRONCHIAL NAVIGATION N/A 07/26/2016   Procedure: VIDEO BRONCHOSCOPY WITH ENDOBRONCHIAL NAVIGATION;  Surgeon: Collene Gobble, MD;  Location: [place];  Service: Thoracic;  Laterality: N/A;  . VIDEO BRONCHOSCOPY WITH ENDOBRONCHIAL NAVIGATION N/A 10/13/2019   Procedure: [facility] AND VIDEO BRONCHOSCOPY WITH ENDOBRONCHIAL NAVIGATION;  Surgeon: Collene Gobble, MD;  Location: [place] ENDOSCOPY;  Service: Pulmonary;  Laterality: N/A;  . VIDEO BRONCHOSCOPY WITH ENDOBRONCHIAL NAVIGATION N/A 04/12/2020   Procedure: VIDEO BRONCHOSCOPY WITH ENDOBRONCHIAL NAVIGATION;  Surgeon: Collene Gobble, MD;  Location: [place] ENDOSCOPY;  Service: Pulmonary;  Laterality: N/A;    reports that he quit smoking about 2 years ago. His smoking use included cigarettes. He started smoking about 70 years ago. He has a 50.25 pack-year smoking history. He has never been exposed to tobacco smoke. He has never used smokeless tobacco. He reports that he does not drink alcohol and does not use drugs. Social History   Socioeconomic History  . Marital status: Married    Spouse name: Not on file  . Number of children: 2  . Years of education: Not on file  . Highest education level: Not on file  Occupational History  . 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 Smoking status: Former    Packs/day: 0.75    Years: 67.00    Total pack years: 50.25    Types: Cigarettes    Start date: 66    Quit date: 07/13/2020    Years since quitting: 2.1    Passive exposure: Never  . Smokeless tobacco: Never  . Tobacco comments:    Recent Quit    Vaping Use  . Vaping Use: Former  Substance and Sexual  Activity  . Alcohol use: No    Alcohol/week: 0.0 standard drinks of alcohol    Comment: quit drinking 2014  . Drug use: No  . Sexual activity: Not on file  Other Topics Concern  . Not on file  Social History Narrative   Has living will   Wife is health care POA--alternate is son Buelah Manis)   Has daughter in [place]   Has DNR   No tube feeds if cognitively unaware   Social Determinants of Health   Financial Resource Strain: Not on file  Food Insecurity: No Food Insecurity (08/13/2022)   Hunger Vital Sign   .  Worried About Charity fundraiser in the Last Year: Never true   . Ran Out of Food in the Last Year: Never true  Transportation Needs: No Transportation Needs (08/13/2022)   PRAPARE - Transportation   . Lack of Transportation (Medical): No   . Lack of Transportation (Non-Medical): No  Physical Activity: Not on file  Stress: Not on file  Social Connections: Not on file  Intimate Partner Violence: Not At Risk (08/13/2022)   Humiliation, Afraid, Rape, and Kick questionnaire   . Fear of Current or Ex-Partner: No   . Emotionally Abused: No   . Physically Abused: No   . Sexually Abused: No    Functional Status Survey:    Family History  Problem Relation Age of Onset  . Hypertension Mother   . Alzheimer's disease Mother     Health Maintenance  Topic Date Due  . DTaP/Tdap/Td (1 - Tdap) Never done  . Zoster Vaccines- Shingrix (1 of 2) Never done  . COVID-19 Vaccine (6 - 2023-24 season) 03/30/2022  . Medicare Annual Wellness (AWV)  06/19/2022  . Pneumonia Vaccine 78+ Years old  Completed  . INFLUENZA VACCINE  Completed  . HPV VACCINES  Aged Out    Allergies  Allergen Reactions  . Flagyl [Metronidazole] Other (See Comments)    Other reaction(s): skin reaction    Outpatient Encounter Medications as of 08/22/2022  Medication Sig  . acetaminophen (TYLENOL) 325 MG tablet Take 650 mg by mouth every 4 (four) hours as needed.  Marland Kitchen albuterol (PROVENTIL) (2.5 MG/3ML)  0.083% nebulizer solution Take 3 mLs (2.5 mg total) by nebulization every 6 (six) hours as needed for wheezing or shortness of breath.  Marland Kitchen albuterol (VENTOLIN HFA) 108 (90 Base) MCG/ACT inhaler Inhale 2 puffs into the lungs 3 (three) times daily as needed for wheezing or shortness of breath.  Marland Kitchen ammonium lactate (AMLACTIN) 12 % cream Apply topically at bedtime.  Marland Kitchen apixaban (ELIQUIS) 5 MG TABS tablet Take 1 tablet (5 mg total) by mouth 2 (two) times daily.  Marland Kitchen azithromycin (ZITHROMAX) 250 MG tablet Take 1 tablet (250 mg total) by mouth daily for 6 days. Vies to take daily  . Budeson-Glycopyrrol-Formoterol (BREZTRI AEROSPHERE) 160-9-4.8 MCG/ACT AERO Inhale 2 puffs into the lungs 2 (two) times daily.  . clonazePAM (KLONOPIN) 0.5 MG tablet Take 1 tablet (0.5 mg total) by mouth daily as needed for anxiety.  Marland Kitchen diltiazem (CARDIZEM CD) 180 MG 24 hr capsule Take 1 capsule (180 mg total) by mouth 2 (two) times daily.  . finasteride (PROSCAR) 5 MG tablet Take 5 mg by mouth daily.  . furosemide (LASIX) 20 MG tablet Take 20 mg by mouth daily.  Marland Kitchen gabapentin (NEURONTIN) 100 MG capsule Take 100 mg by mouth 2 (two) times daily.  Marland Kitchen ketoconazole (NIZORAL) 2 % cream Apply topically 2 (two) times daily.  . Melatonin 5 MG CAPS Take 5 mg by mouth at bedtime.  . nitroGLYCERIN (NITROSTAT) 0.4 MG SL tablet Place 1 tablet (0.4 mg total) under the tongue every 5 (five) minutes as needed for chest pain.  . OXYGEN Inhale into the lungs. 2 liters upon exertion and 2 liters continuous at night.  . pantoprazole (PROTONIX) 40 MG tablet Take 40 mg by mouth daily.  . polyethylene glycol powder (GLYCOLAX/MIRALAX) 17 GM/SCOOP powder Take 255 g by mouth daily.  . predniSONE (DELTASONE) 10 MG tablet Advised to wean prednisone by 5 mg daily and then dc.  . rosuvastatin (CRESTOR) 20 MG tablet Take 1 tablet (20 mg  total) by mouth at bedtime.  . tamsulosin (FLOMAX) 0.4 MG CAPS capsule Take 0.4 mg by mouth every other day.  . [DISCONTINUED]  tamsulosin (FLOMAX) 0.4 MG CAPS capsule TAKE 1 CAPSULE BY MOUTH EVERY DAY (Patient taking differently: Take 0.4 mg by mouth every other day.)   No facility-administered encounter medications on file as of 08/22/2022.    Review of Systems  Vitals:   08/22/22 0830  BP: (!) 101/58  Pulse: 87  Resp: 20  Temp: 98.3 F (36.8 C)  SpO2: 98%  Weight: 149 lb (67.6 kg)  Height: 5' 10.5" (1.791 m)   Body mass index is 21.08 kg/m. Physical Exam  Labs reviewed: Basic Metabolic Panel: Recent Labs    08/12/22 1224 08/13/22 0303 08/15/22 0514 08/16/22 0146 08/19/22 0637 08/20/22 0908  NA 140   < > 135 136 137  --   K 3.8   < > 4.4 4.8 5.1 4.6  CL 101   < > 99 99 99  --   CO2 31   < > 29 32 36*  --   GLUCOSE 100*   < > 144* 153* 119*  --   BUN 25*   < > 41* 47* 32*  --   CREATININE 1.21   < > 1.04 1.10 0.89  --   CALCIUM 8.7*   < > 8.3* 8.4* 8.3*  --   MG 2.0  --   --  2.4  --   --   PHOS  --   --   --  4.1  --   --    < > = values in this interval not displayed.   Liver Function Tests: Recent Labs    04/03/22 0921 08/12/22 1224  AST 25 22  ALT 15 14  ALKPHOS 65 50  BILITOT 0.4 0.9  PROT 6.1 6.4*  ALBUMIN 4.2 3.6   No results for input(s): "LIPASE", "AMYLASE" in the last 8760 hours. No results for input(s): "AMMONIA" in the last 8760 hours. CBC: Recent Labs    05/30/22 1106 08/12/22 1224 08/13/22 0303 08/14/22 0501 08/16/22 0146 08/19/22 0637  WBC 14.2* 13.6*   < > 11.7* 10.8* 13.2*  NEUTROABS 12.4* 10.6*  --   --   --   --   HGB 15.3 12.5*   < > 10.9* 11.0* 11.4*  HCT 46.1 39.7   < > 33.4* 33.4* 35.4*  MCV 93.3 98.8   < > 95.7 94.9 96.7  PLT 332.0 317   < > 255 260 230   < > = values in this interval not displayed.   Cardiac Enzymes: No results for input(s): "CKTOTAL", "CKMB", "CKMBINDEX", "TROPONINI" in the last 8760 hours. BNP: Invalid input(s): "POCBNP" Lab Results  Component Value Date   HGBA1C 5.0 08/13/2022   Lab Results  Component Value Date    TSH 0.700 08/12/2022   No results found for: "VITAMINB12" No results found for: "FOLATE" No results found for: "IRON", "TIBC", "FERRITIN"  Imaging and Procedures obtained prior to SNF admission: DG Chest Port 1 View  Result Date: 08/12/2022 CLINICAL DATA:  Shortness of breath. EXAM: PORTABLE CHEST 1 VIEW COMPARISON:  Two-view chest x-ray 07/18/2022 FINDINGS: Heart size is normal. Changes of COPD again noted. No superimposed airspace disease or edema is present. No significant effusions are present. Overlying defibrillator pads and leads are noted. IMPRESSION: 1. No acute cardiopulmonary disease. 2. COPD. Electronically Signed   By: San Morelle M.D.   On: 08/12/2022 12:54    Assessment/Plan  There are no diagnoses linked to this encounter.   Family/ staff Communication:   Labs/tests ordered:

## 2022-08-27 ENCOUNTER — Non-Acute Institutional Stay (SKILLED_NURSING_FACILITY): Payer: Medicare Other | Admitting: Student

## 2022-08-27 ENCOUNTER — Encounter: Payer: Self-pay | Admitting: Student

## 2022-08-27 DIAGNOSIS — I951 Orthostatic hypotension: Secondary | ICD-10-CM

## 2022-08-27 DIAGNOSIS — I959 Hypotension, unspecified: Secondary | ICD-10-CM | POA: Diagnosis not present

## 2022-08-27 LAB — CBC AND DIFFERENTIAL
HCT: 38 — AB (ref 41–53)
Hemoglobin: 13 — AB (ref 13.5–17.5)
Neutrophils Absolute: 11718
Platelets: 259 10*3/uL (ref 150–400)
WBC: 14

## 2022-08-27 LAB — BASIC METABOLIC PANEL
BUN: 31 — AB (ref 4–21)
CO2: 34 — AB (ref 13–22)
Chloride: 100 (ref 99–108)
Creatinine: 0.9 (ref 0.6–1.3)
Glucose: 73
Sodium: 138 (ref 137–147)

## 2022-08-27 LAB — CBC: RBC: 4.11 (ref 3.87–5.11)

## 2022-08-27 LAB — COMPREHENSIVE METABOLIC PANEL
Calcium: 8.4 — AB (ref 8.7–10.7)
eGFR: 63

## 2022-08-27 NOTE — Progress Notes (Unsigned)
Location:  Other [place].  Nursing Home Room Number: [place] of Service:  SNF [phone]) Provider:  Dr. Dewayne Shorter  PCP: [name], MD  Patient Care Team: [name], MD as PCP - General (Internal Medicine) Revankar, Reita Cliche, MD as PCP - Cardiology (Cardiology)  Extended Emergency Contact Information Primary Emergency Contact: Markiewicz,Susan Address: [address]          [place],  [address] [place] Phone: [phone] Relation: Spouse  Code Status:  Full Code Goals of care: Advanced Directive information    08/27/2022   12:53 PM  Advanced Directives  Does Patient Have a Medical Advance Directive? Yes  Type of Advance Directive Out of facility DNR (pink MOST or yellow form)  Does patient want to make changes to medical advance directive? No - Patient declined     Chief Complaint  Patient presents with   Acute Visit    Low Blood Pressure with Therapy.     HPI:  Pt is a 82 y.o. male seen today for an acute visit for low blood pressures with physical therapy. His blood pressure previously would improve with rest - it was moreso orthostatic in nature. Now he has not seen the typical increase in his blood pressure. He is eating and drinking well. He wants to go home, but has had limitations with therapy due to low blood pressure. He wants to go home with an aid before the end of the week.  Patient has noted with holding one dose of lasix he has had increase in his leg swelling  Past Medical History:  Diagnosis Date   Abdominal discomfort 12/07/2020   Acquired trigger finger of right middle finger 02/04/2020   Acute prostatitis 04/08/2018   Alcohol abuse    Alcohol dependence ([place]) 12/07/2020   Anal or rectal pain 04/08/2018   Annual physical exam 12/07/2020   Anorectal disorder 12/07/2020   Arthritis    Atherosclerotic heart disease of native coronary artery without angina pectoris 04/08/2018   Benign prostatic  hyperplasia with lower urinary tract symptoms 12/07/2020   BPH with elevated PSA    Cancer ([place])    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   Chronic obstructive pulmonary disease with (acute) exacerbation ([place]) 12/07/2020   Chronic respiratory failure with hypoxia ([place]) 08/10/2019   Chronic sinusitis 12/07/2020   Cigarette nicotine dependence, uncomplicated 38/18/2993   Coagulation disorder ([place]) 01/13/2019   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   Contracture of palmar fascia 12/07/2020   COPD (chronic obstructive pulmonary disease) ([place])    Corn of toe 12/07/2020   Corns and callosities 04/09/2018   Coronary artery disease    2019 with stents   Coronary atherosclerosis 05/07/2018   Degenerative disc disease, 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   Degenerative disc disease, 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   Diarrhea 04/09/2018   Double vision with both eyes open 12/07/2020   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   Dysphagia 08/14/2018   Dyspnea    ED (erectile dysfunction)    ED (erectile dysfunction) of organic origin 12/07/2020   Elevated prostate specific antigen (PSA) 04/08/2018   Elevated PSA    Encounter for screening for other disorder 12/07/2020   Enlarged prostate without lower urinary tract symptoms (luts) 04/09/2018   Enthesopathy 12/07/2020   Essential (primary) hypertension 04/08/2018   Ex-smoker 04/09/2018   Exertional dyspnea 04/02/2018   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   Flatulence 04/08/2018   Gastro-esophageal reflux  disease without esophagitis 04/08/2018   GERD (gastroesophageal reflux disease)    History of acute otitis externa 08/14/2018   Hyperlipidemia    Hypertension    Hypoxemia 12/07/2020   Kidney cysts    Left knee pain 12/07/2020   Low back pain 04/08/2018   Male erectile dysfunction 04/09/2018   Mixed hyperlipidemia 04/08/2018   Nocturia more than twice per night 04/09/2018   Osteoarthritis of first carpometacarpal  joint 04/08/2018   Osteoarthritis of knee 12/07/2020   Other long term (current) drug therapy 12/07/2020   Pain due to onychomycosis of toenail of left foot 07/20/2019   Pain in joint 04/09/2018   Pain in knee 04/09/2018   Pain in unspecified knee 12/07/2020   Pain of finger 04/09/2018   Palpitations    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[place]) 12/07/2020   Personal history of colonic polyps 12/07/2020   Pneumonia    Pneumothorax 04/12/2020   Polypharmacy 04/09/2018   Porokeratosis 01/13/2019   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[place])    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   Pulmonary nodules 06/08/2016   Pure hypercholesterolemia 12/07/2020   Renal cyst 12/07/2020   Sleep disorder 04/08/2018   Status post bronchoscopy 04/12/2020   Tear of rotator cuff 04/09/2018   Tobacco user 88/41/6606   Uncomplicated alcohol dependence ([place]) 04/09/2018   Unspecified rotator cuff tear or rupture of unspecified shoulder, not specified as traumatic 12/07/2020   Unspecified tear of unspecified meniscus, current injury, unspecified knee, initial encounter 12/07/2020   Visual disturbance 12/07/2020   Vitamin D deficiency 04/08/2018   Past Surgical History:  Procedure Laterality Date   BRONCHIAL BIOPSY  10/13/2019   Procedure: BRONCHIAL BIOPSIES;  Surgeon: Collene Gobble, MD;  Location: [place];  Service: Pulmonary;;   BRONCHIAL BIOPSY  04/12/2020   Procedure: BRONCHIAL BIOPSIES;  Surgeon: Collene Gobble, MD;  Location: [place];  Service: Pulmonary;;   BRONCHIAL BRUSHINGS  10/13/2019   Procedure: BRONCHIAL BRUSHINGS;  Surgeon: Collene Gobble, MD;  Location: [facility] ENDOSCOPY;  Service: Pulmonary;;   BRONCHIAL BRUSHINGS  04/12/2020   Procedure: BRONCHIAL BRUSHINGS;  Surgeon: Collene Gobble, MD;  Location: [facility] ENDOSCOPY;  Service: Pulmonary;;   BRONCHIAL NEEDLE ASPIRATION BIOPSY  10/13/2019   Procedure: BRONCHIAL NEEDLE ASPIRATION BIOPSIES;  Surgeon: Collene Gobble, MD;  Location: [facility] ENDOSCOPY;  Service: Pulmonary;;    BRONCHIAL NEEDLE ASPIRATION BIOPSY  04/12/2020   Procedure: BRONCHIAL NEEDLE ASPIRATION BIOPSIES;  Surgeon: Collene Gobble, MD;  Location: [facility] ENDOSCOPY;  Service: Pulmonary;;   BRONCHIAL WASHINGS  10/13/2019   Procedure: BRONCHIAL WASHINGS;  Surgeon: Collene Gobble, MD;  Location: [facility] ENDOSCOPY;  Service: Pulmonary;;   BRONCHIAL WASHINGS  04/12/2020   Procedure: BRONCHIAL WASHINGS;  Surgeon: Collene Gobble, MD;  Location: [place] ENDOSCOPY;  Service: Pulmonary;;   CARDIAC CATHETERIZATION  2002   50% RCA   CATARACT EXTRACTION, BILATERAL     CORONARY STENT INTERVENTION N/A 04/15/2018   Procedure: CORONARY STENT INTERVENTION;  Surgeon: Jettie Booze, MD;  Location: [place] CV LAB;  Service: Cardiovascular;  Laterality: N/A;  om1   EYE SURGERY     KNEE ARTHROSCOPY     LEFT HEART CATH AND CORONARY ANGIOGRAPHY N/A 04/15/2018   Procedure: LEFT HEART CATH AND CORONARY ANGIOGRAPHY;  Surgeon: Jettie Booze, MD;  Location: [place] CV LAB;  Service: Cardiovascular;  Laterality: N/A;   MULTIPLE TOOTH EXTRACTIONS     TONSILLECTOMY     VIDEO BRONCHOSCOPY  04/12/2020   VIDEO BRONCHOSCOPY WITH ENDOBRONCHIAL NAVIGATION N/A 07/26/2016  Procedure: VIDEO BRONCHOSCOPY WITH ENDOBRONCHIAL NAVIGATION;  Surgeon: Collene Gobble, MD;  Location: [place];  Service: Thoracic;  Laterality: N/A;   VIDEO BRONCHOSCOPY WITH ENDOBRONCHIAL NAVIGATION N/A 10/13/2019   Procedure: [facility] AND VIDEO BRONCHOSCOPY WITH ENDOBRONCHIAL NAVIGATION;  Surgeon: Collene Gobble, MD;  Location: [place] ENDOSCOPY;  Service: Pulmonary;  Laterality: N/A;   VIDEO BRONCHOSCOPY WITH ENDOBRONCHIAL NAVIGATION N/A 04/12/2020   Procedure: VIDEO BRONCHOSCOPY WITH ENDOBRONCHIAL NAVIGATION;  Surgeon: Collene Gobble, MD;  Location: [place] ENDOSCOPY;  Service: Pulmonary;  Laterality: N/A;    Allergies  Allergen Reactions   Flagyl [Metronidazole] Other (See Comments)    Other reaction(s): skin reaction    Outpatient Encounter Medications as of  08/27/2022  Medication Sig   acetaminophen (TYLENOL) 325 MG tablet Take 650 mg by mouth every 4 (four) hours as needed.   albuterol (PROVENTIL) (2.5 MG/3ML) 0.083% nebulizer solution Take 3 mLs (2.5 mg total) by nebulization every 6 (six) hours as needed for wheezing or shortness of breath.   albuterol (VENTOLIN HFA) 108 (90 Base) MCG/ACT inhaler Inhale 2 puffs into the lungs 3 (three) times daily as needed for wheezing or shortness of breath.   ammonium lactate (AMLACTIN) 12 % cream Apply topically at bedtime.   apixaban (ELIQUIS) 5 MG TABS tablet Take 1 tablet (5 mg total) by mouth 2 (two) times daily.   Budeson-Glycopyrrol-Formoterol (BREZTRI AEROSPHERE) 160-9-4.8 MCG/ACT AERO Inhale 2 puffs into the lungs 2 (two) times daily.   clonazePAM (KLONOPIN) 0.5 MG tablet Take 1 tablet (0.5 mg total) by mouth daily as needed for anxiety.   diltiazem (CARDIZEM CD) 180 MG 24 hr capsule Take 1 capsule (180 mg total) by mouth 2 (two) times daily.   finasteride (PROSCAR) 5 MG tablet Take 5 mg by mouth daily.   furosemide (LASIX) 20 MG tablet Take 20 mg by mouth daily.   gabapentin (NEURONTIN) 100 MG capsule Take 100 mg by mouth 2 (two) times daily.   Infant Care Products [facility] EX) Apply to sacrum three times daily and as needed   ketoconazole (NIZORAL) 2 % cream Apply topically 2 (two) times daily.   Melatonin 5 MG CAPS Take 5 mg by mouth at bedtime.   nitroGLYCERIN (NITROSTAT) 0.4 MG SL tablet Place 1 tablet (0.4 mg total) under the tongue every 5 (five) minutes as needed for chest pain.   OXYGEN Inhale into the lungs. 2 liters upon exertion and 2 liters continuous at night.   pantoprazole (PROTONIX) 40 MG tablet Take 40 mg by mouth daily.   polyethylene glycol powder (GLYCOLAX/MIRALAX) 17 GM/SCOOP powder Take 255 g by mouth daily.   predniSONE (DELTASONE) 10 MG tablet Advised to wean prednisone by 5 mg daily and then dc.   rosuvastatin (CRESTOR) 20 MG tablet Take 1 tablet (20 mg total) by mouth at  bedtime.   tamsulosin (FLOMAX) 0.4 MG CAPS capsule Take 0.4 mg by mouth every other day.   No facility-administered encounter medications on file as of 08/27/2022.    Review of Systems  All other systems reviewed and are negative.   Immunization History  Administered Date(s) Administered   Fluad Quad(high Dose 65+) 04/13/2020, 03/29/2021, 05/18/2022   Influenza Split 04/19/2009, 04/26/2010, 05/14/2011, 05/06/2015, 02/28/2019, 04/13/2020   Influenza, High Dose Seasonal PF 04/02/2018, 03/11/2019, 04/13/2020   Influenza,inj,Quad PF,6+ Mos 03/30/2016   PFIZER(Purple Top)SARS-COV-2 Vaccination 08/13/2019, 09/01/2019, 05/24/2020, 09/13/2020   Pfizer Covid-19 Vaccine Bivalent Booster 56yr & up 04/20/2021, 05/27/2021   Pneumococcal Conjugate-13 04/29/2014   Pneumococcal Polysaccharide-23 09/12/2005, 05/01/2013  Zoster, Live 04/13/2008   Pertinent  Health Maintenance Due  Topic Date Due   INFLUENZA VACCINE  Completed      04/12/2020    8:00 PM 04/13/2020    9:00 AM 04/13/2020   12:00 PM 09/12/2020   11:17 AM 08/12/2022    1:02 PM  Fall Risk  Falls in the past year?    0   (RETIRED) Patient Fall Risk Level Moderate fall risk Moderate fall risk Moderate fall risk Moderate fall risk Low fall risk  Patient at Risk for Falls Due to    Medication side effect   Fall risk Follow up    Falls prevention discussed;Education provided    Functional Status Survey:    Vitals:   08/27/22 1245  BP: (!) 91/57  Pulse: 87  Resp: 16  Temp: 97.6 F (36.4 C)  SpO2: 96%  Weight: 149 lb (67.6 kg)  Height: 5' 10.5" (1.791 m)   Body mass index is 21.08 kg/m. Physical Exam Vitals reviewed.  Constitutional:      Appearance: Normal appearance.  Cardiovascular:     Rate and Rhythm: Normal rate.  Pulmonary:     Comments: Decreased breath sounds bilaterally with decreased expiratory wheezes  Skin:    General: Skin is warm and dry.  Neurological:     Mental Status: He is alert and oriented to  person, place, and time.     Labs reviewed: Recent Labs    08/12/22 1224 08/13/22 0303 08/15/22 0514 08/16/22 0146 08/19/22 0637 08/20/22 0908  NA 140   < > 135 136 137  --   K 3.8   < > 4.4 4.8 5.1 4.6  CL 101   < > 99 99 99  --   CO2 31   < > 29 32 36*  --   GLUCOSE 100*   < > 144* 153* 119*  --   BUN 25*   < > 41* 47* 32*  --   CREATININE 1.21   < > 1.04 1.10 0.89  --   CALCIUM 8.7*   < > 8.3* 8.4* 8.3*  --   MG 2.0  --   --  2.4  --   --   PHOS  --   --   --  4.1  --   --    < > = values in this interval not displayed.   Recent Labs    04/03/22 0921 08/12/22 1224  AST 25 22  ALT 15 14  ALKPHOS 65 50  BILITOT 0.4 0.9  PROT 6.1 6.4*  ALBUMIN 4.2 3.6   Recent Labs    05/30/22 1106 08/12/22 1224 08/13/22 0303 08/14/22 0501 08/16/22 0146 08/19/22 0637  WBC 14.2* 13.6*   < > 11.7* 10.8* 13.2*  NEUTROABS 12.4* 10.6*  --   --   --   --   HGB 15.3 12.5*   < > 10.9* 11.0* 11.4*  HCT 46.1 39.7   < > 33.4* 33.4* 35.4*  MCV 93.3 98.8   < > 95.7 94.9 96.7  PLT 332.0 317   < > 255 260 230   < > = values in this interval not displayed.   Lab Results  Component Value Date   TSH 0.700 08/12/2022   Lab Results  Component Value Date   HGBA1C 5.0 08/13/2022   Lab Results  Component Value Date   CHOL 141 08/13/2022   HDL 80 08/13/2022   LDLCALC 52 08/13/2022   TRIG 46 08/13/2022  CHOLHDL 1.8 08/13/2022    Significant Diagnostic Results in last 30 days:  DG Chest Port 1 View  Result Date: 08/18/2022 CLINICAL DATA:  Shortness of breath. EXAM: PORTABLE CHEST 1 VIEW COMPARISON:  08/17/2022, 08/17/2022 and 06/18/2022 FINDINGS: Lungs are adequately inflated with mild emphysematous disease over the upper lungs. Minimal stable prominence of the central bronchovascular markings which may be due to minimal vascular congestion versus viral bronchopneumonia. No lobar airspace consolidation or effusion. No pneumothorax. Cardiomediastinal silhouette and remainder of the exam  is unchanged. IMPRESSION: 1. Minimal stable prominence of the central bronchovascular markings which may be due to minimal vascular congestion versus viral bronchopneumonia. 2. Emphysematous disease. Electronically Signed   By: Marin Olp M.D.   On: 08/18/2022 14:37   DG Chest Port 1 View  Result Date: 08/17/2022 CLINICAL DATA:  Shortness of breath starting this morning. History of COPD. EXAM: PORTABLE CHEST 1 VIEW COMPARISON:  08/12/2022 and CT chest 03/07/2022 FINDINGS: Tapering of the peripheral pulmonary vasculature favors emphysema. Heart size within normal limits. Stable scarring in the right mid lung. No edema observed. Mild chronic interstitial accentuation. Airway thickening is present, suggesting bronchitis or reactive airways disease. Bilateral degenerative AC joint spurring. IMPRESSION: 1. Airway thickening is present, suggesting bronchitis or reactive airways disease. 2. Stable scarring in the right mid lung. 3. Emphysema. 4. Mild chronic interstitial accentuation. Electronically Signed   By: Van Clines M.D.   On: 08/17/2022 10:10   DG Chest Port 1 View  Result Date: 08/12/2022 CLINICAL DATA:  Shortness of breath. EXAM: PORTABLE CHEST 1 VIEW COMPARISON:  Two-view chest x-ray 07/18/2022 FINDINGS: Heart size is normal. Changes of COPD again noted. No superimposed airspace disease or edema is present. No significant effusions are present. Overlying defibrillator pads and leads are noted. IMPRESSION: 1. No acute cardiopulmonary disease. 2. COPD. Electronically Signed   By: San Morelle M.D.   On: 08/12/2022 12:54   ECHOCARDIOGRAM COMPLETE  Result Date: 08/07/2022    ECHOCARDIOGRAM REPORT   Patient Name:   Mael Delap. Date of Exam: 08/06/2022 Medical Rec #:  [id number]            Height:       70.5 in Accession #:    [id number]           Weight:       160.0 lb Date of Birth:  1941/05/08            BSA:          1.909 m Patient Age:    25 years             BP:            110/60 mmHg Patient Gender: M                    HR:           117 bpm. Exam Location:  [place] Procedure: 2D Echo, Cardiac Doppler and Color Doppler Indications:    R06.00 Dyspnea  History:        Patient has no prior history of Echocardiogram examinations.                 CAD, COPD; Risk Factors:Former Smoker, Hypertension and                 Dyslipidemia. Dyspnea on exertion. Oxygen dependent.  Sonographer:    Diamond Nickel RCS Referring Phys: [place]  1. Tachycardia noted.  Left ventricular ejection fraction, by estimation, is 65 to 70%. The left ventricle has normal function. The left ventricle has no regional wall motion abnormalities. There is mild left ventricular hypertrophy. Left ventricular diastolic parameters are indeterminate. There is the interventricular septum is flattened in systole and diastole, consistent with right ventricular pressure and volume overload.  2. Right ventricular systolic function is normal. The right ventricular size is normal. Tricuspid regurgitation signal is inadequate for assessing PA pressure.  3. The mitral valve is normal in structure. No evidence of mitral valve regurgitation. No evidence of mitral stenosis.  4. The aortic valve is normal in structure. Aortic valve regurgitation is not visualized. No aortic stenosis is present.  5. The inferior vena cava is normal in size with greater than 50% respiratory variability, suggesting right atrial pressure of 3 mmHg. FINDINGS  Left Ventricle: Tachycardia noted. Left ventricular ejection fraction, by estimation, is 65 to 70%. The left ventricle has normal function. The left ventricle has no regional wall motion abnormalities. The left ventricular internal cavity size was normal in size. There is mild left ventricular hypertrophy. The interventricular septum is flattened in systole and diastole, consistent with right ventricular pressure and volume overload. Left ventricular diastolic parameters are  indeterminate. Right Ventricle: The right ventricular size is normal. No increase in right ventricular wall thickness. Right ventricular systolic function is normal. Tricuspid regurgitation signal is inadequate for assessing PA pressure. Left Atrium: Left atrial size was normal in size. Right Atrium: Right atrial size was normal in size. Pericardium: There is no evidence of pericardial effusion. Mitral Valve: The mitral valve is normal in structure. No evidence of mitral valve regurgitation. No evidence of mitral valve stenosis. Tricuspid Valve: The tricuspid valve is normal in structure. Tricuspid valve regurgitation is not demonstrated. No evidence of tricuspid stenosis. Aortic Valve: The aortic valve is normal in structure. Aortic valve regurgitation is not visualized. No aortic stenosis is present. Pulmonic Valve: The pulmonic valve was normal in structure. Pulmonic valve regurgitation is not visualized. No evidence of pulmonic stenosis. Aorta: The aortic root is normal in size and structure. Venous: The inferior vena cava is normal in size with greater than 50% respiratory variability, suggesting right atrial pressure of 3 mmHg. IAS/Shunts: No atrial level shunt detected by color flow Doppler.  LEFT VENTRICLE PLAX 2D LVIDd:         3.40 cm LVIDs:         2.00 cm LV PW:         1.30 cm LV IVS:        1.10 cm LVOT diam:     2.30 cm LV SV:         75 LV SV Index:   40 LVOT Area:     4.15 cm  RIGHT VENTRICLE RV Basal diam:  2.20 cm RV S prime:     13.67 cm/s TAPSE (M-mode): 2.0 cm LEFT ATRIUM             Index        RIGHT ATRIUM          Index LA diam:        2.90 cm 1.52 cm/m   RA Area:     9.20 cm LA Vol (A2C):   47.1 ml 24.68 ml/m  RA Volume:   15.20 ml 7.96 ml/m LA Vol (A4C):   25.2 ml 13.20 ml/m LA Biplane Vol: 35.4 ml 18.55 ml/m  AORTIC VALVE LVOT Vmax:   107.13 cm/s LVOT Vmean:  75.233 cm/s LVOT VTI:    0.182 m  AORTA Ao Root diam: 3.70 cm Ao Asc diam:  3.90 cm  SHUNTS Systemic VTI:  0.18 m Systemic  Diam: 2.30 cm Candee Furbish MD Electronically signed by Candee Furbish MD Signature Date/Time: 08/07/2022/10:13:56 AM    Final     Assessment/Plan Hypotension, unspecified hypotension type  Orthostatic hypotension Patient with low blood pressure for the last few days. Will plan to collect CBC and BMP. Will also discontinue flomax and hold furosemide for evidence of volume overload. Will continue to monitor BP medication. Recommend Ted hose daily.   Family/ staff Communication: nursing  Labs/tests ordered:  CBC BMP

## 2022-08-28 ENCOUNTER — Non-Acute Institutional Stay: Payer: Self-pay | Admitting: Hospice

## 2022-08-28 ENCOUNTER — Inpatient Hospital Stay: Payer: Medicare Other | Admitting: Internal Medicine

## 2022-08-28 DIAGNOSIS — R0602 Shortness of breath: Secondary | ICD-10-CM | POA: Diagnosis not present

## 2022-08-28 DIAGNOSIS — R531 Weakness: Secondary | ICD-10-CM

## 2022-08-28 DIAGNOSIS — Z515 Encounter for palliative care: Secondary | ICD-10-CM

## 2022-08-28 DIAGNOSIS — F419 Anxiety disorder, unspecified: Secondary | ICD-10-CM

## 2022-08-28 NOTE — Progress Notes (Signed)
[facility] Consult Note Telephone: [phone]  Fax: [phone]  PATIENT NAME: [name] [name] [address] [place] [address] [phone] (home)  DOB: 02-Jan-1941 MRN: [id number]  PRIMARY CARE PROVIDER:    [name], MD,  [place] [address] [phone]  REFERRING PROVIDER:   [name], MD [name] [place],  [place] [address] [phone]  RESPONSIBLE PARTY:   Self Contact Information     Name Relation Home Work Mobile   [place] Spouse [phone]          I met face to face with patient in the facility. Visit to build trust and highlight Palliative Medicine as specialized medical care for people living with serious illness, aimed at facilitating better quality of life through symptoms relief, assisting with advance care planning and complex medical decision making.  ASSESSMENT AND / RECOMMENDATIONS:   Advance Care Planning: Our advance care planning conversation included a discussion about:    The value and importance of advance care planning  Difference between Hospice and Palliative care Exploration of goals of care in the event of a sudden injury or illness  Identification and preparation of a healthcare agent  Review and updating or creation of an  advance directive document . Decision not to resuscitate or to de-escalate disease focused treatments due to poor prognosis.  CODE STATUS: Full code  Goals of Care: Goals include to maximize quality of life and symptom management  I spent 16  minutes providing this initial consultation. More than 50% of the time in this consultation was spent on counseling patient and coordinating communication. --------------------------------------------------------------------------------------------------------------------------------------  Symptom Management/Plan: Shortness of breath: Worsened with recent  COPD exacerbation for which patient was hospitalized, treated and discharged to SNF for acute rehab.  Continue albuterol, Breztri, and oxygen supplementation as ordered.  Continue prednisone as ordered.  Avoid triggers.  Encourage slow deep breathing/pursed lip breathing.  Follow-up with pulmonologist as ordered. Anxiety: Worsened with recent hospitalization.  Managed with clonazepam.  Use redirection/de-escalation techniques.  Slow deep breathing encouraged. Routine CBC/CMP Weakness: Worsened with recent hospitalization.  PT/OT for strengthening and ambulation.  Fall precautions. Follow up: Palliative care will continue to follow for complex medical decision making, advance care planning, and clarification of goals. Return 6 weeks or prn. Encouraged to call provider sooner with any concerns.   Family /Caregiver/Community Supports: Patient in SNF for acute rehab.  HOSPICE ELIGIBILITY/DIAGNOSIS: TBD  Chief Complaint: Initial Palliative care visit  HISTORY OF PRESENT ILLNESS:  [name] [name]. is a 82 y.o. year old male  with multiple morbidities requiring close monitoring and with high risk of complications and  mortality: COPD with recent exacerbation, HTN, HLD, PVD, CAD,with stents, anxiety, BPH, alcohol abuse in remission for 10 years.  Patient denies pain/discomfort; reports breathing a lot easier, still needing his oxygen,  and working with physical therapy to regain his strength. History obtained from review of EMR, discussion with primary team, caregiver, family and/or [name] [name].  Review and summarization of Epic records shows history from other than patient. Rest of 10 point ROS asked and negative. Independent interpretation of tests and reviewed as needed, available labs, patient records, imaging, studies and related documents from the EMR.   PAST MEDICAL HISTORY:  Active Ambulatory Problems    Diagnosis Date Noted   Pulmonary nodules 06/08/2016   COPD (chronic obstructive  pulmonary disease) ([place]) 06/08/2016   DOE (dyspnea on exertion) 04/02/2018  Vitamin D deficiency 04/08/2018   Osteoarthritis of first carpometacarpal joint 04/08/2018   Gastro-esophageal reflux disease without esophagitis 04/08/2018   Mixed hyperlipidemia 04/08/2018   Flatulence 04/08/2018   Anal or rectal pain 04/08/2018   Elevated prostate specific antigen (PSA) 04/08/2018   Atherosclerotic heart disease of native coronary artery without angina pectoris 04/08/2018   Essential (primary) hypertension 04/08/2018   Sleep disorder 04/08/2018   Low back pain 04/08/2018   Corns and callosities 04/09/2018   Diarrhea 04/09/2018   Enlarged prostate without lower urinary tract symptoms (luts) 04/09/2018   Male erectile dysfunction 04/09/2018   Cigarette nicotine dependence, uncomplicated 32/95/1884   Nocturia more than twice per night 04/09/2018   Pain in joint 04/09/2018   Pain in knee 04/09/2018   Pain of finger 04/09/2018   Polypharmacy 16/60/6301   Uncomplicated alcohol dependence ([place]) 04/09/2018   Ex-smoker 04/09/2018   Coronary atherosclerosis 05/07/2018   Dysphagia 08/14/2018   History of acute otitis externa 08/14/2018   Coagulation disorder ([place]) 01/13/2019   Chronic respiratory failure with hypoxia ([place]) 08/10/2019   Status post bronchoscopy 04/12/2020   Pneumothorax 04/12/2020   Acquired trigger finger of right middle finger 02/04/2020   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   Alcohol abuse    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[place])    Coronary artery disease    GERD (gastroesophageal reflux disease)    Hyperlipidemia    Hypertension    Kidney cysts    Pneumonia    Annual physical exam 12/07/2020   Chronic sinusitis 12/07/2020   Double vision with both eyes open 12/07/2020   Left knee pain 12/07/2020   Hypoxemia 12/07/2020   Osteoarthritis of knee 12/07/2020   Benign prostatic hyperplasia with lower urinary tract symptoms 12/07/2020   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    Renal cyst 12/07/2020   Chronic obstructive pulmonary disease with (acute) exacerbation ([place]) 12/07/2020   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[place]) 12/07/2020   Contracture of palmar fascia 12/07/2020   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[place]) 12/07/2020   Alcohol dependence ([place]) 12/07/2020   Degenerative disc disease, 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   Degenerative disc disease, 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   Chronic respiratory failure with hypoxia and hypercapnia ([place]) 05/31/2022   Current chronic use of systemic steroids 07/05/2022   RSV bronchitis 08/12/2022   Acute on chronic respiratory failure with hypoxia ([place]) 08/12/2022   COPD exacerbation ([place]) 08/12/2022   Atrial fibrillation with RVR ([place]) 08/12/2022   Myocardial injury 08/12/2022   Anxiety 08/12/2022   BPH (benign prostatic hyperplasia) 08/12/2022   Atrial flutter ([place]) 08/15/2022   Resolved Ambulatory Problems    Diagnosis Date Noted   Acute prostatitis 04/08/2018   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   Tear of rotator cuff 04/09/2018   COPD with acute exacerbation ([place]) 10/08/2018   Porokeratosis 01/13/2019   Pain due to onychomycosis of toenail of left foot 07/20/2019   Arthritis    BPH with elevated PSA    Dyspnea    ED (erectile dysfunction)    Elevated PSA    Palpitations    Abdominal discomfort 12/07/2020   Encounter for screening for other disorder 12/07/2020   Anorectal disorder 12/07/2020   Corn of toe 12/07/2020   Enthesopathy 12/07/2020   Other long term (current) drug therapy 12/07/2020   Personal history of colonic polyps 12/07/2020   Pure hypercholesterolemia 12/07/2020   Tobacco user 12/07/2020   Unspecified tear of unspecified meniscus, current injury, unspecified knee, initial encounter 12/07/2020   Visual disturbance 12/07/2020  ED (erectile dysfunction) of organic origin 12/07/2020   Pain in unspecified knee 12/07/2020   Unspecified rotator cuff tear or rupture of unspecified  shoulder, not specified as traumatic 12/07/2020   Swelling of ankle joint, left 03/16/2021   Past Medical History:  Diagnosis Date   Cancer ([place])    Exertional dyspnea 04/02/2018    SOCIAL HX:  Social History   Tobacco Use   Smoking status: Former    Packs/day: 0.75    Years: 67.00    Total pack years: 50.25    Types: Cigarettes    Start date: 18    Quit date: 07/13/2020    Years since quitting: 2.1    Passive exposure: Never   Smokeless tobacco: Never   Tobacco comments:    Recent Quit    Substance Use Topics   Alcohol use: No    Alcohol/week: 0.0 standard drinks of alcohol    Comment: quit drinking 2014     FAMILY HX:  Family History  Problem Relation Age of Onset   Hypertension Mother    Alzheimer's disease Mother       ALLERGIES:  Allergies  Allergen Reactions   Flagyl [Metronidazole] Other (See Comments)    Other reaction(s): skin reaction      PERTINENT MEDICATIONS:  Outpatient Encounter Medications as of 08/28/2022  Medication Sig   acetaminophen (TYLENOL) 325 MG tablet Take 650 mg by mouth every 4 (four) hours as needed.   albuterol (PROVENTIL) (2.5 MG/3ML) 0.083% nebulizer solution Take 3 mLs (2.5 mg total) by nebulization every 6 (six) hours as needed for wheezing or shortness of breath.   albuterol (VENTOLIN HFA) 108 (90 Base) MCG/ACT inhaler Inhale 2 puffs into the lungs 3 (three) times daily as needed for wheezing or shortness of breath.   ammonium lactate (AMLACTIN) 12 % cream Apply topically at bedtime.   apixaban (ELIQUIS) 5 MG TABS tablet Take 1 tablet (5 mg total) by mouth 2 (two) times daily.   Budeson-Glycopyrrol-Formoterol (BREZTRI AEROSPHERE) 160-9-4.8 MCG/ACT AERO Inhale 2 puffs into the lungs 2 (two) times daily.   clonazePAM (KLONOPIN) 0.5 MG tablet Take 1 tablet (0.5 mg total) by mouth daily as needed for anxiety.   diltiazem (CARDIZEM CD) 180 MG 24 hr capsule Take 1 capsule (180 mg total) by mouth 2 (two) times daily.   finasteride  (PROSCAR) 5 MG tablet Take 5 mg by mouth daily.   furosemide (LASIX) 20 MG tablet Take 20 mg by mouth daily.   gabapentin (NEURONTIN) 100 MG capsule Take 100 mg by mouth 2 (two) times daily.   Infant Care Products [facility] EX) Apply to sacrum three times daily and as needed   ketoconazole (NIZORAL) 2 % cream Apply topically 2 (two) times daily.   Melatonin 5 MG CAPS Take 5 mg by mouth at bedtime.   nitroGLYCERIN (NITROSTAT) 0.4 MG SL tablet Place 1 tablet (0.4 mg total) under the tongue every 5 (five) minutes as needed for chest pain.   OXYGEN Inhale into the lungs. 2 liters upon exertion and 2 liters continuous at night.   pantoprazole (PROTONIX) 40 MG tablet Take 40 mg by mouth daily.   polyethylene glycol powder (GLYCOLAX/MIRALAX) 17 GM/SCOOP powder Take 255 g by mouth daily.   predniSONE (DELTASONE) 10 MG tablet Advised to wean prednisone by 5 mg daily and then dc.   rosuvastatin (CRESTOR) 20 MG tablet Take 1 tablet (20 mg total) by mouth at bedtime.   tamsulosin (FLOMAX) 0.4 MG CAPS capsule Take 0.4 mg by  mouth every other day.   No facility-administered encounter medications on file as of 08/28/2022.     Thank you for the opportunity to participate in the care of [name] [name].  The palliative care team will continue to follow. Please call our office at [phone] if we can be of additional assistance.   Note: Portions of this note were generated with [employer]. Dictation errors may occur despite best attempts at proofreading.  Teodoro Spray, NP

## 2022-08-29 ENCOUNTER — Encounter: Payer: Self-pay | Admitting: Student

## 2022-08-29 DIAGNOSIS — J431 Panlobular emphysema: Secondary | ICD-10-CM

## 2022-08-29 NOTE — Progress Notes (Unsigned)
Location:  Other [place].  Nursing Home Room Number: [facility] 120A Place of Service:  SNF [phone]) Provider:  Dr. Earnestine Mealing  PCP: [name], MD  Patient Care Team: [name], MD as PCP - General (Internal Medicine) Revankar, Aundra Dubin, MD as PCP - Cardiology (Cardiology)  Extended Emergency Contact Information Primary Emergency Contact: Cherney,Susan Address: [address]          [place], [place] [address] [place] of [place] Home Phone: [phone] Relation: Spouse  Code Status:  Full Code.  Goals of care: Advanced Directive information    08/29/2022    4:46 PM  Advanced Directives  Does Patient Have a Medical Advance Directive? Yes  Type of Advance Directive Out of facility DNR (pink MOST or yellow form)  Does patient want to make changes to medical advance directive? No - Patient declined     Chief Complaint  Patient presents with   Acute Visit    Low Blood Pressure.     HPI:  Pt is a 82 y.o. 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   Contracture of palmar fascia 12/07/2020   COPD (chronic obstructive pulmonary disease) (HCC)    Corn of toe 12/07/2020   Corns and callosities 04/09/2018   Coronary artery disease    2019 with stents   Coronary atherosclerosis 05/07/2018   Degenerative disc disease, 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   Degenerative disc disease, 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   Diarrhea 04/09/2018   Double vision with both eyes open 12/07/2020   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   Dysphagia 08/14/2018   Dyspnea    ED (erectile dysfunction)    ED (erectile dysfunction) of organic origin 12/07/2020   Elevated prostate specific antigen (PSA) 04/08/2018   Elevated PSA    Encounter for screening for other disorder 12/07/2020   Enlarged prostate without lower urinary tract symptoms (luts) 04/09/2018   Enthesopathy 12/07/2020   Essential (primary) hypertension 04/08/2018   Ex-smoker 04/09/2018   Exertional dyspnea 04/02/2018   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   Flatulence 04/08/2018   Gastro-esophageal reflux disease without esophagitis 04/08/2018   GERD (gastroesophageal reflux disease)    History of acute otitis externa 08/14/2018   Hyperlipidemia    Hypertension    Hypoxemia 12/07/2020   Kidney cysts    Left knee pain 12/07/2020   Low back pain 04/08/2018   Male erectile dysfunction 04/09/2018   Mixed hyperlipidemia 04/08/2018   Nocturia more than twice per night 04/09/2018   Osteoarthritis of first carpometacarpal joint 04/08/2018   Osteoarthritis of knee 12/07/2020   Other  long term (current) drug therapy 12/07/2020   Pain due to onychomycosis of toenail of left foot 07/20/2019   Pain in joint 04/09/2018   Pain in knee 04/09/2018   Pain in unspecified knee 12/07/2020   Pain of finger 04/09/2018   Palpitations    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HCC) 12/07/2020   Personal history of colonic polyps 12/07/2020   Pneumonia    Pneumothorax 04/12/2020   Polypharmacy 04/09/2018    Porokeratosis 01/13/2019   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HCC)    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   Pulmonary nodules 06/08/2016   Pure hypercholesterolemia 12/07/2020   Renal cyst 12/07/2020   Sleep disorder 04/08/2018   Status post bronchoscopy 04/12/2020   Tear of rotator cuff 04/09/2018   Tobacco user 12/07/2020   Uncomplicated alcohol dependence (HCC) 04/09/2018   Unspecified rotator cuff tear or rupture of unspecified shoulder, not specified as traumatic 12/07/2020   Unspecified tear of unspecified meniscus, current injury, unspecified knee, initial encounter 12/07/2020   Visual disturbance 12/07/2020   Vitamin D deficiency 04/08/2018   Past Surgical History:  Procedure Laterality Date   BRONCHIAL BIOPSY  10/13/2019   Procedure: BRONCHIAL BIOPSIES;  Surgeon: Leslye Peer, MD;  Location: [facility] ENDOSCOPY;  Service: Pulmonary;;   BRONCHIAL BIOPSY  04/12/2020   Procedure: BRONCHIAL BIOPSIES;  Surgeon: Leslye Peer, MD;  Location: [facility] ENDOSCOPY;  Service: Pulmonary;;   BRONCHIAL BRUSHINGS  10/13/2019   Procedure: BRONCHIAL BRUSHINGS;  Surgeon: Leslye Peer, MD;  Location: [facility] ENDOSCOPY;  Service: Pulmonary;;   BRONCHIAL BRUSHINGS  04/12/2020   Procedure: BRONCHIAL BRUSHINGS;  Surgeon: Leslye Peer, MD;  Location: [facility] ENDOSCOPY;  Service: Pulmonary;;   BRONCHIAL NEEDLE ASPIRATION BIOPSY  10/13/2019   Procedure: BRONCHIAL NEEDLE ASPIRATION BIOPSIES;  Surgeon: Leslye Peer, MD;  Location: [facility] ENDOSCOPY;  Service: Pulmonary;;   BRONCHIAL NEEDLE ASPIRATION BIOPSY  04/12/2020   Procedure: BRONCHIAL NEEDLE ASPIRATION BIOPSIES;  Surgeon: Leslye Peer, MD;  Location: [facility] ENDOSCOPY;  Service: Pulmonary;;   BRONCHIAL WASHINGS  10/13/2019   Procedure: BRONCHIAL WASHINGS;  Surgeon: Leslye Peer, MD;  Location: [facility] ENDOSCOPY;  Service: Pulmonary;;   BRONCHIAL WASHINGS  04/12/2020   Procedure: BRONCHIAL WASHINGS;  Surgeon: Leslye Peer, MD;  Location: [facility] ENDOSCOPY;  Service: Pulmonary;;    CARDIAC CATHETERIZATION  2002   50% RCA   CATARACT EXTRACTION, BILATERAL     CORONARY STENT INTERVENTION N/A 04/15/2018   Procedure: CORONARY STENT INTERVENTION;  Surgeon: Corky Crafts, MD;  Location: [facility] INVASIVE CV LAB;  Service: Cardiovascular;  Laterality: N/A;  om1   EYE SURGERY     KNEE ARTHROSCOPY     LEFT HEART CATH AND CORONARY ANGIOGRAPHY N/A 04/15/2018   Procedure: LEFT HEART CATH AND CORONARY ANGIOGRAPHY;  Surgeon: Corky Crafts, MD;  Location: [facility] INVASIVE CV LAB;  Service: Cardiovascular;  Laterality: N/A;   MULTIPLE TOOTH EXTRACTIONS     TONSILLECTOMY     VIDEO BRONCHOSCOPY  04/12/2020   VIDEO BRONCHOSCOPY WITH ENDOBRONCHIAL NAVIGATION N/A 07/26/2016   Procedure: VIDEO BRONCHOSCOPY WITH ENDOBRONCHIAL NAVIGATION;  Surgeon: Leslye Peer, MD;  Location: [facility] OR;  Service: Thoracic;  Laterality: N/A;   VIDEO BRONCHOSCOPY WITH ENDOBRONCHIAL NAVIGATION N/A 10/13/2019   Procedure: [facility] AND VIDEO BRONCHOSCOPY WITH ENDOBRONCHIAL NAVIGATION;  Surgeon: Leslye Peer, MD;  Location: [facility] ENDOSCOPY;  Service: Pulmonary;  Laterality: N/A;   VIDEO BRONCHOSCOPY WITH ENDOBRONCHIAL NAVIGATION N/A 04/12/2020   Procedure: VIDEO BRONCHOSCOPY WITH ENDOBRONCHIAL NAVIGATION;  Surgeon: Leslye Peer, MD;  Location: [facility] ENDOSCOPY;  Service: Pulmonary;  Laterality: N/A;    Allergies  Allergen Reactions   Flagyl [Metronidazole] Other (See Comments)    Other reaction(s): skin reaction    Outpatient Encounter Medications as of 08/29/2022  Medication Sig   acetaminophen (TYLENOL) 325 MG tablet Take 650 mg by mouth every 4 (four) hours as needed.   albuterol (PROVENTIL) (2.5 MG/3ML) 0.083% nebulizer solution Take 3 mLs (2.5 mg total) by nebulization every 6 (six) hours as needed for wheezing or shortness of breath.   albuterol (VENTOLIN HFA) 108 (90 Base) MCG/ACT inhaler Inhale 2 puffs into the lungs 3 (three) times daily as needed for wheezing or shortness of breath.   ammonium lactate  (AMLACTIN) 12 % cream Apply topically at bedtime.   apixaban (ELIQUIS) 5 MG TABS tablet Take 1 tablet (5 mg total) by mouth 2 (two) times daily.   Budeson-Glycopyrrol-Formoterol (BREZTRI AEROSPHERE) 160-9-4.8 MCG/ACT AERO Inhale 2 puffs into the lungs 2 (two) times daily.   clonazePAM (KLONOPIN) 0.5 MG tablet Take 1 tablet (0.5 mg total) by mouth daily as needed for anxiety.   diltiazem (CARDIZEM CD) 180 MG 24 hr capsule Take 1 capsule (180 mg total) by mouth 2 (two) times daily.   furosemide (LASIX) 20 MG tablet Take 20 mg by mouth daily.   gabapentin (NEURONTIN) 100 MG capsule Take 100 mg by mouth 2 (two) times daily.   Infant Care Products [facility] EX) Apply to sacrum three times daily and as needed   ketoconazole (NIZORAL) 2 % cream Apply topically 2 (two) times daily.   Melatonin 5 MG CAPS Take 5 mg by mouth at bedtime.   nitroGLYCERIN (NITROSTAT) 0.4 MG SL tablet Place 1 tablet (0.4 mg total) under the tongue every 5 (five) minutes as needed for chest pain.   OXYGEN Inhale into the lungs. 2 liters upon exertion and 2 liters continuous at night.   pantoprazole (PROTONIX) 40 MG tablet Take 40 mg by mouth daily.   polyethylene glycol powder (GLYCOLAX/MIRALAX) 17 GM/SCOOP powder Take 255 g by mouth daily.   rosuvastatin (CRESTOR) 20 MG tablet Take 1 tablet (20 mg total) by mouth at bedtime.   tamsulosin (FLOMAX) 0.4 MG CAPS capsule Take 0.4 mg by mouth every other day.   predniSONE (DELTASONE) 10 MG tablet Advised to wean prednisone by 5 mg daily and then dc.   [DISCONTINUED] finasteride (PROSCAR) 5 MG tablet Take 5 mg by mouth daily.   No facility-administered encounter medications on file as of 08/29/2022.    Review of Systems  Immunization History  Administered Date(s) Administered   Fluad Quad(high Dose 65+) 04/13/2020, 03/29/2021, 05/18/2022   Influenza Split 04/19/2009, 04/26/2010, 05/14/2011, 05/06/2015, 02/28/2019, 04/13/2020   Influenza, High Dose Seasonal PF 04/02/2018,  03/11/2019, 04/13/2020   Influenza,inj,Quad PF,6+ Mos 03/30/2016   PFIZER(Purple Top)SARS-COV-2 Vaccination 08/13/2019, 09/01/2019, 05/24/2020, 09/13/2020   Pfizer Covid-19 Vaccine Bivalent Booster 31yrs & up 04/20/2021, 05/27/2021   Pneumococcal Conjugate-13 04/29/2014   Pneumococcal Polysaccharide-23 09/12/2005, 05/01/2013   Zoster, Live 04/13/2008   Pertinent  Health Maintenance Due  Topic Date Due   INFLUENZA VACCINE  Completed      04/12/2020    8:00 PM 04/13/2020    9:00 AM 04/13/2020   12:00 PM 09/12/2020   11:17 AM 08/12/2022    1:02 PM  Fall Risk  Falls in the past year?    0   (RETIRED) Patient Fall Risk Level Moderate fall risk Moderate fall risk Moderate fall risk Moderate fall risk Low fall risk  Patient at Risk for  Falls Due to    Medication side effect   Fall risk Follow up    Falls prevention discussed;Education provided    Functional Status Survey:    Vitals:   08/29/22 1629  BP: (!) 89/51  Pulse: 88  Resp: 20  Temp: 98 F (36.7 C)  SpO2: 96%  Weight: 158 lb 6.4 oz (71.8 kg)  Height: 5' 10.5" (1.791 m)   Body mass index is 22.41 kg/m. Physical Exam  Labs reviewed: Recent Labs    08/12/22 1224 08/13/22 0303 08/15/22 0514 08/16/22 0146 08/19/22 0637 08/20/22 0908 08/27/22 0000  NA 140   < > 135 136 137  --  138  K 3.8   < > 4.4 4.8 5.1 4.6  --   CL 101   < > 99 99 99  --  100  CO2 31   < > 29 32 36*  --  34*  GLUCOSE 100*   < > 144* 153* 119*  --   --   BUN 25*   < > 41* 47* 32*  --  31*  CREATININE 1.21   < > 1.04 1.10 0.89  --  0.9  CALCIUM 8.7*   < > 8.3* 8.4* 8.3*  --  8.4*  MG 2.0  --   --  2.4  --   --   --   PHOS  --   --   --  4.1  --   --   --    < > = values in this interval not displayed.   Recent Labs    04/03/22 0921 08/12/22 1224  AST 25 22  ALT 15 14  ALKPHOS 65 50  BILITOT 0.4 0.9  PROT 6.1 6.4*  ALBUMIN 4.2 3.6   Recent Labs    05/30/22 1106 08/12/22 1224 08/13/22 0303 08/14/22 0501 08/16/22 0146  08/19/22 0637 08/27/22 0000  WBC 14.2* 13.6*   < > 11.7* 10.8* 13.2* 14.0  NEUTROABS 12.4* 10.6*  --   --   --   --  11,718.00  HGB 15.3 12.5*   < > 10.9* 11.0* 11.4* 13.0*  HCT 46.1 39.7   < > 33.4* 33.4* 35.4* 38*  MCV 93.3 98.8   < > 95.7 94.9 96.7  --   PLT 332.0 317   < > 255 260 230 259   < > = values in this interval not displayed.   Lab Results  Component Value Date   TSH 0.700 08/12/2022   Lab Results  Component Value Date   HGBA1C 5.0 08/13/2022   Lab Results  Component Value Date   CHOL 141 08/13/2022   HDL 80 08/13/2022   LDLCALC 52 08/13/2022   TRIG 46 08/13/2022   CHOLHDL 1.8 08/13/2022    Significant Diagnostic Results in last 30 days:  DG Chest Port 1 View  Result Date: 08/18/2022 CLINICAL DATA:  Shortness of breath. EXAM: PORTABLE CHEST 1 VIEW COMPARISON:  08/17/2022, 08/17/2022 and 06/18/2022 FINDINGS: Lungs are adequately inflated with mild emphysematous disease over the upper lungs. Minimal stable prominence of the central bronchovascular markings which may be due to minimal vascular congestion versus viral bronchopneumonia. No lobar airspace consolidation or effusion. No pneumothorax. Cardiomediastinal silhouette and remainder of the exam is unchanged. IMPRESSION: 1. Minimal stable prominence of the central bronchovascular markings which may be due to minimal vascular congestion versus viral bronchopneumonia. 2. Emphysematous disease. Electronically Signed   By: Elberta Fortis M.D.   On: 08/18/2022 14:37   DG Chest  Port 1 View  Result Date: 08/17/2022 CLINICAL DATA:  Shortness of breath starting this morning. History of COPD. EXAM: PORTABLE CHEST 1 VIEW COMPARISON:  08/12/2022 and CT chest 03/07/2022 FINDINGS: Tapering of the peripheral pulmonary vasculature favors emphysema. Heart size within normal limits. Stable scarring in the right mid lung. No edema observed. Mild chronic interstitial accentuation. Airway thickening is present, suggesting bronchitis or  reactive airways disease. Bilateral degenerative AC joint spurring. IMPRESSION: 1. Airway thickening is present, suggesting bronchitis or reactive airways disease. 2. Stable scarring in the right mid lung. 3. Emphysema. 4. Mild chronic interstitial accentuation. Electronically Signed   By: Gaylyn Rong M.D.   On: 08/17/2022 10:10   DG Chest Port 1 View  Result Date: 08/12/2022 CLINICAL DATA:  Shortness of breath. EXAM: PORTABLE CHEST 1 VIEW COMPARISON:  Two-view chest x-ray 07/18/2022 FINDINGS: Heart size is normal. Changes of COPD again noted. No superimposed airspace disease or edema is present. No significant effusions are present. Overlying defibrillator pads and leads are noted. IMPRESSION: 1. No acute cardiopulmonary disease. 2. COPD. Electronically Signed   By: Marin Roberts M.D.   On: 08/12/2022 12:54   ECHOCARDIOGRAM COMPLETE  Result Date: 08/07/2022    ECHOCARDIOGRAM REPORT   Patient Name:   Jatorian Kauk. Date of Exam: 08/06/2022 Medical Rec #:  [id number]            Height:       70.5 in Accession #:    [id number]           Weight:       160.0 lb Date of Birth:  Feb 12, 1941            BSA:          1.909 m Patient Age:    81 years             BP:           110/60 mmHg Patient Gender: M                    HR:           117 bpm. Exam Location:  [address] Procedure: 2D Echo, Cardiac Doppler and Color Doppler Indications:    R06.00 Dyspnea  History:        Patient has no prior history of Echocardiogram examinations.                 CAD, COPD; Risk Factors:Former Smoker, Hypertension and                 Dyslipidemia. Dyspnea on exertion. Oxygen dependent.  Sonographer:    Cathie Beams RCS Referring Phys: [id number] TAMMY S PARRETT IMPRESSIONS  1. Tachycardia noted. Left ventricular ejection fraction, by estimation, is 65 to 70%. The left ventricle has normal function. The left ventricle has no regional wall motion abnormalities. There is mild left ventricular hypertrophy. Left  ventricular diastolic parameters are indeterminate. There is the interventricular septum is flattened in systole and diastole, consistent with right ventricular pressure and volume overload.  2. Right ventricular systolic function is normal. The right ventricular size is normal. Tricuspid regurgitation signal is inadequate for assessing PA pressure.  3. The mitral valve is normal in structure. No evidence of mitral valve regurgitation. No evidence of mitral stenosis.  4. The aortic valve is normal in structure. Aortic valve regurgitation is not visualized. No aortic stenosis is present.  5. The inferior vena cava is normal in size with greater  than 50% respiratory variability, suggesting right atrial pressure of 3 mmHg. FINDINGS  Left Ventricle: Tachycardia noted. Left ventricular ejection fraction, by estimation, is 65 to 70%. The left ventricle has normal function. The left ventricle has no regional wall motion abnormalities. The left ventricular internal cavity size was normal in size. There is mild left ventricular hypertrophy. The interventricular septum is flattened in systole and diastole, consistent with right ventricular pressure and volume overload. Left ventricular diastolic parameters are indeterminate. Right Ventricle: The right ventricular size is normal. No increase in right ventricular wall thickness. Right ventricular systolic function is normal. Tricuspid regurgitation signal is inadequate for assessing PA pressure. Left Atrium: Left atrial size was normal in size. Right Atrium: Right atrial size was normal in size. Pericardium: There is no evidence of pericardial effusion. Mitral Valve: The mitral valve is normal in structure. No evidence of mitral valve regurgitation. No evidence of mitral valve stenosis. Tricuspid Valve: The tricuspid valve is normal in structure. Tricuspid valve regurgitation is not demonstrated. No evidence of tricuspid stenosis. Aortic Valve: The aortic valve is normal in  structure. Aortic valve regurgitation is not visualized. No aortic stenosis is present. Pulmonic Valve: The pulmonic valve was normal in structure. Pulmonic valve regurgitation is not visualized. No evidence of pulmonic stenosis. Aorta: The aortic root is normal in size and structure. Venous: The inferior vena cava is normal in size with greater than 50% respiratory variability, suggesting right atrial pressure of 3 mmHg. IAS/Shunts: No atrial level shunt detected by color flow Doppler.  LEFT VENTRICLE PLAX 2D LVIDd:         3.40 cm LVIDs:         2.00 cm LV PW:         1.30 cm LV IVS:        1.10 cm LVOT diam:     2.30 cm LV SV:         75 LV SV Index:   40 LVOT Area:     4.15 cm  RIGHT VENTRICLE RV Basal diam:  2.20 cm RV S prime:     13.67 cm/s TAPSE (M-mode): 2.0 cm LEFT ATRIUM             Index        RIGHT ATRIUM          Index LA diam:        2.90 cm 1.52 cm/m   RA Area:     9.20 cm LA Vol (A2C):   47.1 ml 24.68 ml/m  RA Volume:   15.20 ml 7.96 ml/m LA Vol (A4C):   25.2 ml 13.20 ml/m LA Biplane Vol: 35.4 ml 18.55 ml/m  AORTIC VALVE LVOT Vmax:   107.13 cm/s LVOT Vmean:  75.233 cm/s LVOT VTI:    0.182 m  AORTA Ao Root diam: 3.70 cm Ao Asc diam:  3.90 cm  SHUNTS Systemic VTI:  0.18 m Systemic Diam: 2.30 cm Donato Schultz MD Electronically signed by Donato Schultz MD Signature Date/Time: 08/07/2022/10:13:56 AM    Final     Assessment/Plan There are no diagnoses linked to this encounter.   Family/ staff Communication: ***  Labs/tests ordered:  ***

## 2022-08-30 ENCOUNTER — Ambulatory Visit: Payer: Medicare Other | Attending: Medical | Admitting: Medical

## 2022-08-30 ENCOUNTER — Other Ambulatory Visit
Admission: RE | Admit: 2022-08-30 | Discharge: 2022-08-30 | Disposition: A | Discharge status: Home / Self Care | Payer: No Typology Code available for payment source | Source: Ambulatory Visit | Attending: Medical | Admitting: Medical

## 2022-08-30 ENCOUNTER — Encounter: Payer: Self-pay | Admitting: Medical

## 2022-08-30 ENCOUNTER — Ambulatory Visit: Payer: Medicare Other | Admitting: Medical

## 2022-08-30 VITALS — BP 90/44 | HR 87 | Ht 70.5 in | Wt 158.4 lb

## 2022-08-30 DIAGNOSIS — I2510 Atherosclerotic heart disease of native coronary artery without angina pectoris: Secondary | ICD-10-CM | POA: Diagnosis not present

## 2022-08-30 DIAGNOSIS — J9611 Chronic respiratory failure with hypoxia: Secondary | ICD-10-CM

## 2022-08-30 DIAGNOSIS — R0602 Shortness of breath: Secondary | ICD-10-CM | POA: Diagnosis not present

## 2022-08-30 DIAGNOSIS — I5033 Acute on chronic diastolic (congestive) heart failure: Secondary | ICD-10-CM

## 2022-08-30 DIAGNOSIS — I4819 Other persistent atrial fibrillation: Secondary | ICD-10-CM | POA: Diagnosis not present

## 2022-08-30 DIAGNOSIS — I10 Essential (primary) hypertension: Secondary | ICD-10-CM | POA: Diagnosis not present

## 2022-08-30 DIAGNOSIS — E782 Mixed hyperlipidemia: Secondary | ICD-10-CM | POA: Diagnosis not present

## 2022-08-30 LAB — CBC
HCT: 36.6 % — ABNORMAL LOW (ref 39.0–52.0)
Hemoglobin: 11.7 g/dL — ABNORMAL LOW (ref 13.0–17.0)
MCH: 31.2 pg (ref 26.0–34.0)
MCHC: 32 g/dL (ref 30.0–36.0)
MCV: 97.6 fL (ref 80.0–100.0)
Platelets: 156 10*3/uL (ref 150–400)
RBC: 3.75 MIL/uL — ABNORMAL LOW (ref 4.22–5.81)
RDW: 14.8 % (ref 11.5–15.5)
WBC: 13.4 10*3/uL — ABNORMAL HIGH (ref 4.0–10.5)
nRBC: 0 % (ref 0.0–0.2)

## 2022-08-30 LAB — BASIC METABOLIC PANEL
Anion gap: 11 (ref 5–15)
BUN: 30 mg/dL — ABNORMAL HIGH (ref 8–23)
CO2: 29 mmol/L (ref 22–32)
Calcium: 8.2 mg/dL — ABNORMAL LOW (ref 8.9–10.3)
Chloride: 98 mmol/L (ref 98–111)
Creatinine, Ser: 1.14 mg/dL (ref 0.61–1.24)
GFR, Estimated: >60 mL/min (ref >60)
Glucose, Bld: 111 mg/dL — ABNORMAL HIGH (ref 70–99)
Potassium: 5.1 mmol/L (ref 3.5–5.1)
Sodium: 138 mmol/L (ref 135–145)

## 2022-08-30 LAB — BRAIN NATRIURETIC PEPTIDE: B Natriuretic Peptide: 26.7 pg/mL (ref 0.0–100.0)

## 2022-08-30 MED ORDER — FUROSEMIDE 20 MG PO TABS: ORAL_TABLET | ORAL | 3 refills | Status: DC

## 2022-08-30 MED ORDER — DILTIAZEM HCL ER COATED BEADS 120 MG PO CP24: 120.0000 mg | ORAL_CAPSULE | Freq: Two times a day (BID) | ORAL | 3 refills | Status: DC

## 2022-08-30 NOTE — Patient Instructions (Signed)
Medication Instructions:   DECREASE Cardizem - take one tablet (120 mg) by mouth TWICE a day.  2. INCREASE lasix - take one tablet ('20mg'$ ) daily with an addition tablet every other day.   *If you need a refill on your cardiac medications before your next appointment, please call your pharmacy*   Lab Work:  Your physician recommends you go to the medical mall for lab work.   If you have labs (blood work) drawn today and your tests are completely normal, you will receive your results only by: [facility] (if you have MyChart) OR A paper copy in the mail If you have any lab test that is abnormal or we need to change your treatment, we will call you to review the results.   Testing/Procedures:  You are scheduled for a Cardioversion on ______02/16/2024 __________ with Dr.__Arida_________ Please arrive at the [place] [facility] at ___8:30______ a.m. on the day of your procedure.  DIET INSTRUCTIONS:  Nothing to eat or drink after midnight except your medications with a safe sip of water.         Labs: ________TODAY__________  Medications: HOLD lasix the morning of the cardioversion - YOU MAY TAKE ALL of your remaining medications with a small amount of water.  Must have a responsible person to drive you home.  Bring a current list of your medications and current insurance cards.    If you have any questions after you get home, please call the office at [phone]   Follow-Up: At [facility], you and your health needs are our priority.  As part of our continuing mission to provide you with exceptional heart care, we have created designated Provider Care Teams.  These Care Teams include your primary Cardiologist (physician) and Advanced Practice Providers (APPs -  Physician Assistants and Nurse Practitioners) who all work together to provide you with the care you need, when you need it.  We recommend signing up for the patient portal called "MyChart".  Sign up  information is provided on this After Visit Summary.  MyChart is used to connect with patients for Virtual Visits (Telemedicine).  Patients are able to view lab/test results, encounter notes, upcoming appointments, etc.  Non-urgent messages can be sent to your provider as well.   To learn more about what you can do with MyChart, go to [email or web address].    Your next appointment:   2 week(s) after cardioversion  Provider:   You may see Cadence Ninfa Meeker, PA-C or one of the following Advanced Practice Providers on your designated Care Team:   Murray Hodgkins, NP Christell Faith, PA-C Cadence Kathlen Mody, PA-C Gerrie Nordmann, NP

## 2022-08-30 NOTE — Progress Notes (Signed)
Cardiology Office Note:    Date:  09/02/2022   ID:  Veto [name] [name]., DOB 16-Aug-1940, MRN [id number]  PCP:  [name] Carbon, MD  [facility] Cardiologist:  Ladora Osterberg [name] Meeker, PA-C  [facility] Electrophysiologist:  None   Referring MD: [name] Carbon, MD   Chief Complaint: Hospital follow-up  History of Present Illness:    [name] [name]. is a 82 y.o. male with a hx of COPD on chronic home oxygen, pulmonary nodular disease, coronary artery disease s/p PCI Ostial 1st margin 2019, hypertension, hyperlipidemia, right subclavian artery stenosis and GERD who is being seen for hospital follow-up.  Patient underwent left heart cath on April 15, 2018 where he was successfully treated with DES/PCI of the ostial first marginal.  Patient was started on DAPT with aspirin and Plavix for at least 6 months.   Echo 08/06/2022 showed LVEF 65 to 70%, no wall motion abnormalities, mild LVH, normal RV function.  Patient was admitted to [facility] mid January 2024 with complaints of worsening shortness of breath found to have new onset A-fib RVR, acute on chronic hypoxic respiratory failure, RSV, COPD exacerbation requiring BiPAP.  Patient was started on IV Dilt drip with improvement of heart rate. Given CHA2DS2-VASc of 4 patient was started on Eliquis 5 mg twice daily.  Plan was for rate control with outpatient cardioversion.  Today, the patient reports he is getting stronger daily. Breathing is getting better. He is doing PT and OT. He is concerned for low BP. He denies lightheadedness or dizziness despite low BP. Also reports lower leg edema and left swollen hand. No chest pain. He usese a walker or wheelchair. He is on 3L O2 at baseline. 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   Chronic obstructive pulmonary disease with (acute) exacerbation ([name] [name]) 12/07/2020   Chronic respiratory failure with hypoxia ([name] [name]) 08/10/2019   Chronic sinusitis 12/07/2020   Cigarette nicotine dependence, uncomplicated [id number]   Coagulation disorder ([name] [name]) 01/13/2019   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   Contracture of palmar fascia 12/07/2020   COPD (chronic obstructive pulmonary disease) ([name] [name])    Corn of toe 12/07/2020   Corns and callosities 04/09/2018   Coronary artery disease    2019 with stents   Coronary atherosclerosis 05/07/2018   Degenerative disc disease, 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   Degenerative disc disease, 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   Diarrhea 04/09/2018   Double vision with both eyes open 12/07/2020   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   Dysphagia 08/14/2018   Dyspnea    ED (erectile dysfunction)    ED (erectile dysfunction) of organic origin 12/07/2020   Elevated prostate specific antigen (PSA) 04/08/2018   Elevated PSA    Encounter for screening for other disorder 12/07/2020   Enlarged prostate without lower urinary tract symptoms (luts) 04/09/2018   Enthesopathy 12/07/2020   Essential (primary) hypertension 04/08/2018  Ex-smoker 04/09/2018   Exertional dyspnea 04/02/2018   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   Flatulence 04/08/2018   Gastro-esophageal reflux disease without esophagitis 04/08/2018   GERD (gastroesophageal reflux disease)    History of acute otitis externa 08/14/2018   Hyperlipidemia    Hypertension    Hypoxemia 12/07/2020   Kidney  cysts    Left knee pain 12/07/2020   Low back pain 04/08/2018   Male erectile dysfunction 04/09/2018   Mixed hyperlipidemia 04/08/2018   Nocturia more than twice per night 04/09/2018   Osteoarthritis of first carpometacarpal joint 04/08/2018   Osteoarthritis of knee 12/07/2020   Other long term (current) drug therapy 12/07/2020   Pain due to onychomycosis of toenail of left foot 07/20/2019   Pain in joint 04/09/2018   Pain in knee 04/09/2018   Pain in unspecified knee 12/07/2020   Pain of finger 04/09/2018   Palpitations    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[place]) 12/07/2020   Personal history of colonic polyps 12/07/2020   Pneumonia    Pneumothorax 04/12/2020   Polypharmacy 04/09/2018   Porokeratosis 01/13/2019   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[name] [name])    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   Pulmonary nodules 06/08/2016   Pure hypercholesterolemia 12/07/2020   Renal cyst 12/07/2020   Sleep disorder 04/08/2018   Status post bronchoscopy 04/12/2020   Tear of rotator cuff 04/09/2018   Tobacco user [id number]   Uncomplicated alcohol dependence ([name] [name]) 04/09/2018   Unspecified rotator cuff tear or rupture of unspecified shoulder, not specified as traumatic 12/07/2020   Unspecified tear of unspecified meniscus, current injury, unspecified knee, initial encounter 12/07/2020   Visual disturbance 12/07/2020   Vitamin D deficiency 04/08/2018    Past Surgical History:  Procedure Laterality Date   BRONCHIAL BIOPSY  10/13/2019   Procedure: BRONCHIAL BIOPSIES;  Surgeon: Collene Gobble, MD;  Location: [place];  Service: Pulmonary;;   BRONCHIAL BIOPSY  04/12/2020   Procedure: BRONCHIAL BIOPSIES;  Surgeon: Collene Gobble, MD;  Location: [place];  Service: Pulmonary;;   BRONCHIAL BRUSHINGS  10/13/2019   Procedure: BRONCHIAL BRUSHINGS;  Surgeon: Collene Gobble, MD;  Location: [facility] ENDOSCOPY;  Service: Pulmonary;;   BRONCHIAL BRUSHINGS  04/12/2020   Procedure: BRONCHIAL BRUSHINGS;  Surgeon: Collene Gobble,  MD;  Location[facility] ENDOSCOPY;  Service: Pulmonary;;   BRONCHIAL NEEDLE ASPIRATION BIOPSY  10/13/2019   Procedure: BRONCHIAL NEEDLE ASPIRATION BIOPSIES;  Surgeon: Collene Gobble, MD;  Location: [facility] ENDOSCOPY;  Service: Pulmonary;;   BRONCHIAL NEEDLE ASPIRATION BIOPSY  04/12/2020   Procedure: BRONCHIAL NEEDLE ASPIRATION BIOPSIES;  Surgeon: Collene Gobble, MD;  Location[facility]COPY;  Service: Pulmonary;;   BRONCHIAL WASHINGS  10/13/2019   Procedure: BRONCHIAL WASHINGS;  Surgeon: Collene Gobble, MD;  Location: [facility] ENDOSCOPY;  Service: Pulmonary;;   BRONCHIAL WASHINGS  04/12/2020   Procedure: BRONCHIAL WASHINGS;  Surgeon: Collene Gobble, MD;  Location: [place] ENDOSCOPY;  Service: Pulmonary;;   CARDIAC CATHETERIZATION  2002   50% RCA   CATARACT EXTRACTION, BILATERAL     CORONARY STENT INTERVENTION N/A 04/15/2018   Procedure: CORONARY STENT INTERVENTION;  Surgeon: Jettie Booze, MD;  Location: [place] CV LAB;  Service: Cardiovascular;  Laterality: N/A;  om1   EYE SURGERY     KNEE ARTHROSCOPY     LEFT HEART CATH AND CORONARY ANGIOGRAPHY N/A 04/15/2018   Procedure: LEFT HEART CATH AND CORONARY ANGIOGRAPHY;  Surgeon: Jettie Booze, MD;  Location: [place] CV LAB;  Service: Cardiovascular;  Laterality: N/A;   MULTIPLE TOOTH EXTRACTIONS  TONSILLECTOMY     VIDEO BRONCHOSCOPY  04/12/2020   VIDEO BRONCHOSCOPY WITH ENDOBRONCHIAL NAVIGATION N/A 07/26/2016   Procedure: VIDEO BRONCHOSCOPY WITH ENDOBRONCHIAL NAVIGATION;  Surgeon: Collene Gobble, MD;  Location: [place];  Service: Thoracic;  Laterality: N/A;   VIDEO BRONCHOSCOPY WITH ENDOBRONCHIAL NAVIGATION N/A 10/13/2019   Procedure: [facility] AND VIDEO BRONCHOSCOPY WITH ENDOBRONCHIAL NAVIGATION;  Surgeon: Collene Gobble, MD;  Location: [place] ENDOSCOPY;  Service: Pulmonary;  Laterality: N/A;   VIDEO BRONCHOSCOPY WITH ENDOBRONCHIAL NAVIGATION N/A 04/12/2020   Procedure: VIDEO BRONCHOSCOPY WITH ENDOBRONCHIAL NAVIGATION;  Surgeon: Collene Gobble, MD;   Location: [place] ENDOSCOPY;  Service: Pulmonary;  Laterality: N/A;    Current Medications: Current Meds  Medication Sig   acetaminophen (TYLENOL) 325 MG tablet Take 650 mg by mouth every 4 (four) hours as needed.   albuterol (PROVENTIL) (2.5 MG/3ML) 0.083% nebulizer solution Take 3 mLs (2.5 mg total) by nebulization every 6 (six) hours as needed for wheezing or shortness of breath.   albuterol (VENTOLIN HFA) 108 (90 Base) MCG/ACT inhaler Inhale 2 puffs into the lungs 3 (three) times daily as needed for wheezing or shortness of breath.   ammonium lactate (AMLACTIN) 12 % cream Apply topically at bedtime.   apixaban (ELIQUIS) 5 MG TABS tablet Take 1 tablet (5 mg total) by mouth 2 (two) times daily.   Budeson-Glycopyrrol-Formoterol (BREZTRI AEROSPHERE) 160-9-4.8 MCG/ACT AERO Inhale 2 puffs into the lungs 2 (two) times daily.   clonazePAM (KLONOPIN) 0.5 MG tablet Take 1 tablet (0.5 mg total) by mouth daily as needed for anxiety.   gabapentin (NEURONTIN) 100 MG capsule Take 100 mg by mouth 2 (two) times daily.   [employer] [facility] EX) Apply to sacrum three times daily and as needed   ketoconazole (NIZORAL) 2 % cream Apply topically 2 (two) times daily.   Melatonin 5 MG CAPS Take 5 mg by mouth at bedtime.   nitroGLYCERIN (NITROSTAT) 0.4 MG SL tablet Place 1 tablet (0.4 mg total) under the tongue every 5 (five) minutes as needed for chest pain.   OXYGEN Inhale into the lungs. 2 liters upon exertion and 2 liters continuous at night.   pantoprazole (PROTONIX) 40 MG tablet Take 40 mg by mouth daily.   polyethylene glycol powder (GLYCOLAX/MIRALAX) 17 GM/SCOOP powder Take 255 g by mouth daily.   predniSONE (DELTASONE) 10 MG tablet Advised to wean prednisone by 5 mg daily and then dc.   rosuvastatin (CRESTOR) 20 MG tablet Take 1 tablet (20 mg total) by mouth at bedtime.   tamsulosin (FLOMAX) 0.4 MG CAPS capsule Take 0.4 mg by mouth every other day.   [DISCONTINUED] diltiazem (CARDIZEM CD) 180 MG  24 hr capsule Take 1 capsule (180 mg total) by mouth 2 (two) times daily.   [DISCONTINUED] furosemide (LASIX) 20 MG tablet Take 20 mg by mouth daily. Take one tablet (20 mg) by mouth with an addition tablet to equal (40 mg) every other day.     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  Tobacco Use   Smoking status: Former    Packs/day: 0.75    Years: 67.00    Total pack years: 50.25    Types: Cigarettes  Start date: 79    Quit date: 07/13/2020    Years since quitting: 2.1    Passive exposure: Never   Smokeless tobacco: Never   Tobacco comments:    Recent Quit    Vaping Use   Vaping Use: Former  Substance and Sexual Activity   Alcohol use: No    Alcohol/week: 0.0 standard drinks of alcohol    Comment: quit drinking 2014   Drug use: No   Sexual activity: Not on file  Other Topics Concern   Not on file  Social History Narrative   Has living will   Wife is health care POA--alternate is son [name] [name])   Has daughter in [place]   Has DNR   No tube feeds if cognitively unaware   Social Determinants of Health   Financial Resource Strain: Not on file  Food Insecurity: No Food Insecurity (08/13/2022)   Hunger Vital Sign    Worried About Running Out of Food in the Last Year: Never true    Ran Out of Food in the Last Year: Never true  Transportation Needs: No Transportation Needs (08/13/2022)   PRAPARE - Hydrologist (Medical): No    Lack of Transportation (Non-Medical): No  Physical Activity: Not on file  Stress: Not on file  Social Connections: Not on file     Family History: The patient's family history includes Alzheimer's disease in his mother;  Hypertension in his mother.  ROS:   Please see the history of present illness.     All other systems reviewed and are negative.  EKGs/Labs/Other Studies Reviewed:    The following studies were reviewed today:  Echo 07/2022 1. Tachycardia noted. Left ventricular ejection fraction, by estimation,  is 65 to 70%. The left ventricle has normal function. The left ventricle  has no regional wall motion abnormalities. There is mild left ventricular  hypertrophy. Left ventricular  diastolic parameters are indeterminate. There is the interventricular  septum is flattened in systole and diastole, consistent with right  ventricular pressure and volume overload.   2. Right ventricular systolic function is normal. The right ventricular  size is normal. Tricuspid regurgitation signal is inadequate for assessing  PA pressure.   3. The mitral valve is normal in structure. No evidence of mitral valve  regurgitation. No evidence of mitral stenosis.   4. The aortic valve is normal in structure. Aortic valve regurgitation is  not visualized. No aortic stenosis is present.   5. The inferior vena cava is normal in size with greater than 50%  respiratory variability, suggesting right atrial pressure of 3 mmHg.   Cardiac Cath 03/2018 Prox RCA lesion is 25% stenosed. 1st RPLB lesion is 50% stenosed. THis is a very small vessel. Mid LAD lesion is 25% stenosed. 1st Mrg lesion is 90% stenosed. A drug-eluting stent was successfully placed using a STENT SYNERGY DES 2.5X24. Post intervention, there is a 0% residual stenosis. Ost 1st Mrg lesion is 70% stenosed. A drug-eluting stent was successfully placed using a STENT SYNERGY DES 2.75X8. Post intervention, there is a 0% residual stenosis. The left ventricular systolic function is normal. LV end diastolic pressure is normal. The left ventricular ejection fraction is 55-65% by visual estimate. There is no aortic valve stenosis.       Recommend uninterrupted  dual antiplatelet therapy with Aspirin 65m daily and Clopidogrel 768mdaily for a minimum of 6 months (stable ischemic heart disease - Class I recommendation).    Continue aggressive secondary  prevention.  EKG:  EKG is ordered today.  The ekg ordered today demonstrates Aflutter, 4:1 Conduction, nonspecific T wave changes  Recent Labs: 05/30/2022: Pro B Natriuretic peptide (BNP) 34.0 08/12/2022: ALT 14; TSH 0.700 08/16/2022: Magnesium 2.4 08/30/2022: B Natriuretic Peptide 26.7; BUN 30; Creatinine, Ser 1.14; Hemoglobin 11.7; Platelets 156; Potassium 5.1; Sodium 138  Recent Lipid Panel    Component Value Date/Time   CHOL 141 08/13/2022 0303   CHOL 153 04/03/2022 0921   TRIG 46 08/13/2022 0303   HDL 80 08/13/2022 0303   HDL 96 04/03/2022 0921   CHOLHDL 1.8 08/13/2022 0303   VLDL 9 08/13/2022 0303   LDLCALC 52 08/13/2022 0303   LDLCALC 47 04/03/2022 0921    Physical Exam:    VS:  BP (!) 90/44 (BP Location: Right Arm, Patient Position: Sitting, Cuff Size: Normal)   Pulse 87   Ht 5' 10.5" (1.791 m)   Wt 158 lb 6.4 oz (71.8 kg)   SpO2 99%   BMI 22.41 kg/m     Wt Readings from Last 3 Encounters:  08/30/22 158 lb 6.4 oz (71.8 kg)  08/29/22 158 lb 6.4 oz (71.8 kg)  08/27/22 149 lb (67.6 kg)     GEN:  Well nourished, well developed in no acute distress HEENT: Normal NECK: No JVD; No carotid bruits LYMPHATICS: No lymphadenopathy CARDIAC: Reg Irreg, no murmurs, rubs, gallops RESPIRATORY:  Clear to auscultation without rales, wheezing or rhonchi  ABDOMEN: Soft, non-tender, non-distended MUSCULOSKELETAL:  2+ lower leg edema; No deformity  SKIN: Warm and dry NEUROLOGIC:  Alert and oriented x 3 PSYCHIATRIC:  Normal affect   ASSESSMENT:    1. Persistent atrial fibrillation ([place])   2. Acute on chronic diastolic heart failure ([place])   3. Coronary artery disease involving native coronary artery of native heart without angina pectoris   4. Chronic respiratory failure with hypoxia (HCC)    5. Essential hypertension   6. Hyperlipidemia, mixed    PLAN:    In order of problems listed above:  Persistent Afib/flutter Still in Afib/flutter with rate of 87bpm. BP is low today. He is taking Cardizem 173m BID. He has been taking Eliquis 572mBID since 1/16. Patient is fairy asymptomatic, however he does have lower leg edema on exam. I will decrease cardizem to 12026mID. I will set the patient up for cardioversion on 2/16 or after this.   Acute on chronic diastolic heart failure Patient has lower leg edema on exam. Suspected exacerbation in the setting of Afib. I will increase lasix to 55m53mery other day with 20mg62mthe other days. I will check a BNP and BMET today.   Elevated tropnin CAD s/p PCI 2019 HS troponin mildly elevated in the hospital, likely demand ischemia. No anginal symptoms reported. No plan for ischemic evaluation at this time. No ASA with Eliquis. Continue PTA Crestor.    Chronic hypoic respiratory failure He is on 3-3.5L at baseilne  HTN BP is soft, plan to decrease diltiazem as above.   HLD LDL 52 in 2024. Continue Crestor 20mg 70my.   Disposition: Follow up in 1 month(s) with MD/APP   Shared Decision Making/Informed Consent   Shared Decision Making/Informed Consent The risks (stroke, cardiac arrhythmias rarely resulting in the need for a temporary or permanent pacemaker, skin irritation or burns and complications associated with conscious sedation including aspiration, arrhythmia, respiratory failure and death), benefits (restoration of normal sinus rhythm) and alternatives of a direct current cardioversion were explained in detail to Mr. McElroRoslynn Amble  and he agrees to proceed.      Signed, Farida Mcreynolds [name] Meeker, PA-C  09/02/2022 4:32 PM     [facility]

## 2022-08-30 NOTE — H&P (View-Only) (Signed)
Cardiology Office Note:    Date:  09/02/2022   ID:  [name] Kemps., DOB 16-Aug-1940, MRN [id number]  PCP:  [name], MD  [facility] Cardiologist:  Printice Hellmer Ninfa Meeker, PA-C  [facility] Electrophysiologist:  None   Referring MD: [name], MD   Chief Complaint: Hospital follow-up  History of Present Illness:    [name] [name]. is a 82 y.o. male with a hx of COPD on chronic home oxygen, pulmonary nodular disease, coronary artery disease s/p PCI Ostial 1st margin 2019, hypertension, hyperlipidemia, right subclavian artery stenosis and GERD who is being seen for hospital follow-up.  Patient underwent left heart cath on April 15, 2018 where he was successfully treated with DES/PCI of the ostial first marginal.  Patient was started on DAPT with aspirin and Plavix for at least 6 months.   Echo 08/06/2022 showed LVEF 65 to 70%, no wall motion abnormalities, mild LVH, normal RV function.  Patient was admitted to [facility] mid January 2024 with complaints of worsening shortness of breath found to have new onset A-fib RVR, acute on chronic hypoxic respiratory failure, RSV, COPD exacerbation requiring BiPAP.  Patient was started on IV Dilt drip with improvement of heart rate. Given CHA2DS2-VASc of 4 patient was started on Eliquis 5 mg twice daily.  Plan was for rate control with outpatient cardioversion.  Today, the patient reports he is getting stronger daily. Breathing is getting better. He is doing PT and OT. He is concerned for low BP. He denies lightheadedness or dizziness despite low BP. Also reports lower leg edema and left swollen hand. No chest pain. He usese a walker or wheelchair. He is on 3L O2 at baseline. 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   Chronic obstructive pulmonary disease with (acute) exacerbation ([place]) 12/07/2020   Chronic respiratory failure with hypoxia ([place]) 08/10/2019   Chronic sinusitis 12/07/2020   Cigarette nicotine dependence, uncomplicated [id number]   Coagulation disorder ([place]) 01/13/2019   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   Contracture of palmar fascia 12/07/2020   COPD (chronic obstructive pulmonary disease) ([place])    Corn of toe 12/07/2020   Corns and callosities 04/09/2018   Coronary artery disease    2019 with stents   Coronary atherosclerosis 05/07/2018   Degenerative disc disease, 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   Degenerative disc disease, 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   Diarrhea 04/09/2018   Double vision with both eyes open 12/07/2020   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   Dysphagia 08/14/2018   Dyspnea    ED (erectile dysfunction)    ED (erectile dysfunction) of organic origin 12/07/2020   Elevated prostate specific antigen (PSA) 04/08/2018   Elevated PSA    Encounter for screening for other disorder 12/07/2020   Enlarged prostate without lower urinary tract symptoms (luts) 04/09/2018   Enthesopathy 12/07/2020   Essential (primary) hypertension 04/08/2018  Ex-smoker 04/09/2018   Exertional dyspnea 04/02/2018   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   Flatulence 04/08/2018   Gastro-esophageal reflux disease without esophagitis 04/08/2018   GERD (gastroesophageal reflux disease)    History of acute otitis externa 08/14/2018   Hyperlipidemia    Hypertension    Hypoxemia 12/07/2020   Kidney  cysts    Left knee pain 12/07/2020   Low back pain 04/08/2018   Male erectile dysfunction 04/09/2018   Mixed hyperlipidemia 04/08/2018   Nocturia more than twice per night 04/09/2018   Osteoarthritis of first carpometacarpal joint 04/08/2018   Osteoarthritis of knee 12/07/2020   Other long term (current) drug therapy 12/07/2020   Pain due to onychomycosis of toenail of left foot 07/20/2019   Pain in joint 04/09/2018   Pain in knee 04/09/2018   Pain in unspecified knee 12/07/2020   Pain of finger 04/09/2018   Palpitations    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[place]) 12/07/2020   Personal history of colonic polyps 12/07/2020   Pneumonia    Pneumothorax 04/12/2020   Polypharmacy 04/09/2018   Porokeratosis 01/13/2019   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[place])    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   Pulmonary nodules 06/08/2016   Pure hypercholesterolemia 12/07/2020   Renal cyst 12/07/2020   Sleep disorder 04/08/2018   Status post bronchoscopy 04/12/2020   Tear of rotator cuff 04/09/2018   Tobacco user [id number]   Uncomplicated alcohol dependence ([place]) 04/09/2018   Unspecified rotator cuff tear or rupture of unspecified shoulder, not specified as traumatic 12/07/2020   Unspecified tear of unspecified meniscus, current injury, unspecified knee, initial encounter 12/07/2020   Visual disturbance 12/07/2020   Vitamin D deficiency 04/08/2018    Past Surgical History:  Procedure Laterality Date   BRONCHIAL BIOPSY  10/13/2019   Procedure: BRONCHIAL BIOPSIES;  Surgeon: Collene Gobble, MD;  Location: [place];  Service: Pulmonary;;   BRONCHIAL BIOPSY  04/12/2020   Procedure: BRONCHIAL BIOPSIES;  Surgeon: Collene Gobble, MD;  Location: [place];  Service: Pulmonary;;   BRONCHIAL BRUSHINGS  10/13/2019   Procedure: BRONCHIAL BRUSHINGS;  Surgeon: Collene Gobble, MD;  Location: [facility] ENDOSCOPY;  Service: Pulmonary;;   BRONCHIAL BRUSHINGS  04/12/2020   Procedure: BRONCHIAL BRUSHINGS;  Surgeon: Collene Gobble,  MD;  Location[facility] ENDOSCOPY;  Service: Pulmonary;;   BRONCHIAL NEEDLE ASPIRATION BIOPSY  10/13/2019   Procedure: BRONCHIAL NEEDLE ASPIRATION BIOPSIES;  Surgeon: Collene Gobble, MD;  Location: [facility] ENDOSCOPY;  Service: Pulmonary;;   BRONCHIAL NEEDLE ASPIRATION BIOPSY  04/12/2020   Procedure: BRONCHIAL NEEDLE ASPIRATION BIOPSIES;  Surgeon: Collene Gobble, MD;  [facility]COPY;  Service: Pulmonary;;   BRONCHIAL WASHINGS  10/13/2019   Procedure: BRONCHIAL WASHINGS;  Surgeon: Collene Gobble, MD;  Location: [facility] ENDOSCOPY;  Service: Pulmonary;;   BRONCHIAL WASHINGS  04/12/2020   Procedure: BRONCHIAL WASHINGS;  Surgeon: Collene Gobble, MD;  Location: [place] ENDOSCOPY;  Service: Pulmonary;;   CARDIAC CATHETERIZATION  2002   50% RCA   CATARACT EXTRACTION, BILATERAL     CORONARY STENT INTERVENTION N/A 04/15/2018   Procedure: CORONARY STENT INTERVENTION;  Surgeon: Jettie Booze, MD;  Location: [place] CV LAB;  Service: Cardiovascular;  Laterality: N/A;  om1   EYE SURGERY     KNEE ARTHROSCOPY     LEFT HEART CATH AND CORONARY ANGIOGRAPHY N/A 04/15/2018   Procedure: LEFT HEART CATH AND CORONARY ANGIOGRAPHY;  Surgeon: Jettie Booze, MD;  Location: [place] CV LAB;  Service: Cardiovascular;  Laterality: N/A;   MULTIPLE TOOTH EXTRACTIONS  TONSILLECTOMY     VIDEO BRONCHOSCOPY  04/12/2020   VIDEO BRONCHOSCOPY WITH ENDOBRONCHIAL NAVIGATION N/A 07/26/2016   Procedure: VIDEO BRONCHOSCOPY WITH ENDOBRONCHIAL NAVIGATION;  Surgeon: Collene Gobble, MD;  Location: [place];  Service: Thoracic;  Laterality: N/A;   VIDEO BRONCHOSCOPY WITH ENDOBRONCHIAL NAVIGATION N/A 10/13/2019   Procedure: [facility] AND VIDEO BRONCHOSCOPY WITH ENDOBRONCHIAL NAVIGATION;  Surgeon: Collene Gobble, MD;  Location: [place] ENDOSCOPY;  Service: Pulmonary;  Laterality: N/A;   VIDEO BRONCHOSCOPY WITH ENDOBRONCHIAL NAVIGATION N/A 04/12/2020   Procedure: VIDEO BRONCHOSCOPY WITH ENDOBRONCHIAL NAVIGATION;  Surgeon: Collene Gobble, MD;   Location: [place] ENDOSCOPY;  Service: Pulmonary;  Laterality: N/A;    Current Medications: Current Meds  Medication Sig   acetaminophen (TYLENOL) 325 MG tablet Take 650 mg by mouth every 4 (four) hours as needed.   albuterol (PROVENTIL) (2.5 MG/3ML) 0.083% nebulizer solution Take 3 mLs (2.5 mg total) by nebulization every 6 (six) hours as needed for wheezing or shortness of breath.   albuterol (VENTOLIN HFA) 108 (90 Base) MCG/ACT inhaler Inhale 2 puffs into the lungs 3 (three) times daily as needed for wheezing or shortness of breath.   ammonium lactate (AMLACTIN) 12 % cream Apply topically at bedtime.   apixaban (ELIQUIS) 5 MG TABS tablet Take 1 tablet (5 mg total) by mouth 2 (two) times daily.   Budeson-Glycopyrrol-Formoterol (BREZTRI AEROSPHERE) 160-9-4.8 MCG/ACT AERO Inhale 2 puffs into the lungs 2 (two) times daily.   clonazePAM (KLONOPIN) 0.5 MG tablet Take 1 tablet (0.5 mg total) by mouth daily as needed for anxiety.   gabapentin (NEURONTIN) 100 MG capsule Take 100 mg by mouth 2 (two) times daily.   [employer] [facility] EX) Apply to sacrum three times daily and as needed   ketoconazole (NIZORAL) 2 % cream Apply topically 2 (two) times daily.   Melatonin 5 MG CAPS Take 5 mg by mouth at bedtime.   nitroGLYCERIN (NITROSTAT) 0.4 MG SL tablet Place 1 tablet (0.4 mg total) under the tongue every 5 (five) minutes as needed for chest pain.   OXYGEN Inhale into the lungs. 2 liters upon exertion and 2 liters continuous at night.   pantoprazole (PROTONIX) 40 MG tablet Take 40 mg by mouth daily.   polyethylene glycol powder (GLYCOLAX/MIRALAX) 17 GM/SCOOP powder Take 255 g by mouth daily.   predniSONE (DELTASONE) 10 MG tablet Advised to wean prednisone by 5 mg daily and then dc.   rosuvastatin (CRESTOR) 20 MG tablet Take 1 tablet (20 mg total) by mouth at bedtime.   tamsulosin (FLOMAX) 0.4 MG CAPS capsule Take 0.4 mg by mouth every other day.   [DISCONTINUED] diltiazem (CARDIZEM CD) 180 MG  24 hr capsule Take 1 capsule (180 mg total) by mouth 2 (two) times daily.   [DISCONTINUED] furosemide (LASIX) 20 MG tablet Take 20 mg by mouth daily. Take one tablet (20 mg) by mouth with an addition tablet to equal (40 mg) every other day.     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  Tobacco Use   Smoking status: Former    Packs/day: 0.75    Years: 67.00    Total pack years: 50.25    Types: Cigarettes  Start date: 79    Quit date: 07/13/2020    Years since quitting: 2.1    Passive exposure: Never   Smokeless tobacco: Never   Tobacco comments:    Recent Quit    Vaping Use   Vaping Use: Former  Substance and Sexual Activity   Alcohol use: No    Alcohol/week: 0.0 standard drinks of alcohol    Comment: quit drinking 2014   Drug use: No   Sexual activity: Not on file  Other Topics Concern   Not on file  Social History Narrative   Has living will   Wife is health care POA--alternate is son Buelah Manis)   Has daughter in [place]   Has DNR   No tube feeds if cognitively unaware   Social Determinants of Health   Financial Resource Strain: Not on file  Food Insecurity: No Food Insecurity (08/13/2022)   Hunger Vital Sign    Worried About Running Out of Food in the Last Year: Never true    Ran Out of Food in the Last Year: Never true  Transportation Needs: No Transportation Needs (08/13/2022)   PRAPARE - Hydrologist (Medical): No    Lack of Transportation (Non-Medical): No  Physical Activity: Not on file  Stress: Not on file  Social Connections: Not on file     Family History: The patient's family history includes Alzheimer's disease in his mother;  Hypertension in his mother.  ROS:   Please see the history of present illness.     All other systems reviewed and are negative.  EKGs/Labs/Other Studies Reviewed:    The following studies were reviewed today:  Echo 07/2022 1. Tachycardia noted. Left ventricular ejection fraction, by estimation,  is 65 to 70%. The left ventricle has normal function. The left ventricle  has no regional wall motion abnormalities. There is mild left ventricular  hypertrophy. Left ventricular  diastolic parameters are indeterminate. There is the interventricular  septum is flattened in systole and diastole, consistent with right  ventricular pressure and volume overload.   2. Right ventricular systolic function is normal. The right ventricular  size is normal. Tricuspid regurgitation signal is inadequate for assessing  PA pressure.   3. The mitral valve is normal in structure. No evidence of mitral valve  regurgitation. No evidence of mitral stenosis.   4. The aortic valve is normal in structure. Aortic valve regurgitation is  not visualized. No aortic stenosis is present.   5. The inferior vena cava is normal in size with greater than 50%  respiratory variability, suggesting right atrial pressure of 3 mmHg.   Cardiac Cath 03/2018 Prox RCA lesion is 25% stenosed. 1st RPLB lesion is 50% stenosed. THis is a very small vessel. Mid LAD lesion is 25% stenosed. 1st Mrg lesion is 90% stenosed. A drug-eluting stent was successfully placed using a STENT SYNERGY DES 2.5X24. Post intervention, there is a 0% residual stenosis. Ost 1st Mrg lesion is 70% stenosed. A drug-eluting stent was successfully placed using a STENT SYNERGY DES 2.75X8. Post intervention, there is a 0% residual stenosis. The left ventricular systolic function is normal. LV end diastolic pressure is normal. The left ventricular ejection fraction is 55-65% by visual estimate. There is no aortic valve stenosis.       Recommend uninterrupted  dual antiplatelet therapy with Aspirin 65m daily and Clopidogrel 768mdaily for a minimum of 6 months (stable ischemic heart disease - Class I recommendation).    Continue aggressive secondary  prevention.  EKG:  EKG is ordered today.  The ekg ordered today demonstrates Aflutter, 4:1 Conduction, nonspecific T wave changes  Recent Labs: 05/30/2022: Pro B Natriuretic peptide (BNP) 34.0 08/12/2022: ALT 14; TSH 0.700 08/16/2022: Magnesium 2.4 08/30/2022: B Natriuretic Peptide 26.7; BUN 30; Creatinine, Ser 1.14; Hemoglobin 11.7; Platelets 156; Potassium 5.1; Sodium 138  Recent Lipid Panel    Component Value Date/Time   CHOL 141 08/13/2022 0303   CHOL 153 04/03/2022 0921   TRIG 46 08/13/2022 0303   HDL 80 08/13/2022 0303   HDL 96 04/03/2022 0921   CHOLHDL 1.8 08/13/2022 0303   VLDL 9 08/13/2022 0303   LDLCALC 52 08/13/2022 0303   LDLCALC 47 04/03/2022 0921    Physical Exam:    VS:  BP (!) 90/44 (BP Location: Right Arm, Patient Position: Sitting, Cuff Size: Normal)   Pulse 87   Ht 5' 10.5" (1.791 m)   Wt 158 lb 6.4 oz (71.8 kg)   SpO2 99%   BMI 22.41 kg/m     Wt Readings from Last 3 Encounters:  08/30/22 158 lb 6.4 oz (71.8 kg)  08/29/22 158 lb 6.4 oz (71.8 kg)  08/27/22 149 lb (67.6 kg)     GEN:  Well nourished, well developed in no acute distress HEENT: Normal NECK: No JVD; No carotid bruits LYMPHATICS: No lymphadenopathy CARDIAC: Reg Irreg, no murmurs, rubs, gallops RESPIRATORY:  Clear to auscultation without rales, wheezing or rhonchi  ABDOMEN: Soft, non-tender, non-distended MUSCULOSKELETAL:  2+ lower leg edema; No deformity  SKIN: Warm and dry NEUROLOGIC:  Alert and oriented x 3 PSYCHIATRIC:  Normal affect   ASSESSMENT:    1. Persistent atrial fibrillation ([place])   2. Acute on chronic diastolic heart failure ([place])   3. Coronary artery disease involving native coronary artery of native heart without angina pectoris   4. Chronic respiratory failure with hypoxia (HCC)    5. Essential hypertension   6. Hyperlipidemia, mixed    PLAN:    In order of problems listed above:  Persistent Afib/flutter Still in Afib/flutter with rate of 87bpm. BP is low today. He is taking Cardizem 173m BID. He has been taking Eliquis 572mBID since 1/16. Patient is fairy asymptomatic, however he does have lower leg edema on exam. I will decrease cardizem to 12026mID. I will set the patient up for cardioversion on 2/16 or after this.   Acute on chronic diastolic heart failure Patient has lower leg edema on exam. Suspected exacerbation in the setting of Afib. I will increase lasix to 55m53mery other day with 20mg62mthe other days. I will check a BNP and BMET today.   Elevated tropnin CAD s/p PCI 2019 HS troponin mildly elevated in the hospital, likely demand ischemia. No anginal symptoms reported. No plan for ischemic evaluation at this time. No ASA with Eliquis. Continue PTA Crestor.    Chronic hypoic respiratory failure He is on 3-3.5L at baseilne  HTN BP is soft, plan to decrease diltiazem as above.   HLD LDL 52 in 2024. Continue Crestor 20mg 70my.   Disposition: Follow up in 1 month(s) with MD/APP   Shared Decision Making/Informed Consent   Shared Decision Making/Informed Consent The risks (stroke, cardiac arrhythmias rarely resulting in the need for a temporary or permanent pacemaker, skin irritation or burns and complications associated with conscious sedation including aspiration, arrhythmia, respiratory failure and death), benefits (restoration of normal sinus rhythm) and alternatives of a direct current cardioversion were explained in detail to Mr. McElroRoslynn Amble  and he agrees to proceed.      Signed, Gurleen Larrivee Ninfa Meeker, PA-C  09/02/2022 4:32 PM     [facility]

## 2022-08-31 DIAGNOSIS — Z741 Need for assistance with personal care: Secondary | ICD-10-CM | POA: Insufficient documentation

## 2022-09-03 ENCOUNTER — Non-Acute Institutional Stay (SKILLED_NURSING_FACILITY): Payer: Medicare Other | Admitting: Student

## 2022-09-03 ENCOUNTER — Telehealth: Payer: Self-pay | Admitting: Emergency Medicine

## 2022-09-03 ENCOUNTER — Encounter: Payer: Self-pay | Admitting: Student

## 2022-09-03 DIAGNOSIS — R609 Edema, unspecified: Secondary | ICD-10-CM | POA: Diagnosis not present

## 2022-09-03 DIAGNOSIS — J9612 Chronic respiratory failure with hypercapnia: Secondary | ICD-10-CM | POA: Diagnosis not present

## 2022-09-03 DIAGNOSIS — J9611 Chronic respiratory failure with hypoxia: Secondary | ICD-10-CM

## 2022-09-03 DIAGNOSIS — I739 Peripheral vascular disease, unspecified: Secondary | ICD-10-CM | POA: Diagnosis not present

## 2022-09-03 DIAGNOSIS — J431 Panlobular emphysema: Secondary | ICD-10-CM

## 2022-09-03 NOTE — Progress Notes (Signed)
Location:  [facility] Room Number: [place] of Service:  SNF [phone]) Provider:  Dr. Berline Lopes, MD  Patient Care Team: [name], MD as PCP - General (Internal Medicine) Antony Madura, PA-C as PCP - Cardiology (Cardiology)  Extended Emergency Contact Information Primary Emergency Contact: Goetsch,Susan Address: [address]          [place], [place] [address] Johnnette Litter of [place] Phone: [phone] Relation: Spouse  Code Status:  Full Code Goals of care: Advanced Directive information    09/03/2022    2:51 PM  Advanced Directives  Does Patient Have a Medical Advance Directive? Yes  Type of Advance Directive Out of facility DNR (pink MOST or yellow form)  Does patient want to make changes to medical advance directive? No - Patient declined     Chief Complaint  Patient presents with   Acute Visit    Leg and Arm Swelling.     HPI:  Pt is a 82 y.o. male seen today for an acute visit for increased arm swelling and leg swelling. Patient with recent echocardiogram for atrial fibrillation showed EF 60%. He isn't able to put his shoes on due to swelling. BP have been better. Patient has questions about his prognosis. He shows great relief when I say eprognosis says 1 year, however, I worry this is inaccurate due to his new wounds. Patient would like further discussion, however, he has a therapy appointment and the discussion was interrupted.    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   Chronic obstructive pulmonary disease with (acute) exacerbation ([place]) 12/07/2020   Chronic respiratory failure with hypoxia ([place]) 08/10/2019   Chronic sinusitis 12/07/2020   Cigarette nicotine dependence, uncomplicated [id number]   Coagulation disorder ([place]) 01/13/2019   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   Contracture of palmar fascia 12/07/2020   COPD (chronic obstructive pulmonary disease) ([place])    Corn of toe 12/07/2020   Corns and callosities 04/09/2018   Coronary artery disease    2019 with stents   Coronary atherosclerosis 05/07/2018   Degenerative disc disease, 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   Degenerative disc disease, 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   Diarrhea 04/09/2018   Double vision with both eyes open 12/07/2020   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   Dysphagia 08/14/2018   Dyspnea    ED (erectile dysfunction)    ED (erectile dysfunction) of organic origin 12/07/2020   Elevated prostate specific antigen (PSA) 04/08/2018   Elevated PSA    Encounter for screening for other disorder 12/07/2020   Enlarged prostate without lower urinary tract symptoms (luts) 04/09/2018   Enthesopathy 12/07/2020   Essential (primary) hypertension 04/08/2018   Ex-smoker 04/09/2018   Exertional dyspnea 04/02/2018   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   Flatulence 04/08/2018   Gastro-esophageal reflux disease without esophagitis 04/08/2018   GERD (gastroesophageal reflux disease)  History of acute otitis externa 08/14/2018   Hyperlipidemia    Hypertension    Hypoxemia 12/07/2020   Kidney cysts    Left knee pain 12/07/2020   Low back pain 04/08/2018   Male erectile dysfunction 04/09/2018   Mixed hyperlipidemia 04/08/2018   Nocturia more than twice per night 04/09/2018   Osteoarthritis of first carpometacarpal joint 04/08/2018    Osteoarthritis of knee 12/07/2020   Other long term (current) drug therapy 12/07/2020   Pain due to onychomycosis of toenail of left foot 07/20/2019   Pain in joint 04/09/2018   Pain in knee 04/09/2018   Pain in unspecified knee 12/07/2020   Pain of finger 04/09/2018   Palpitations    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[place]) 12/07/2020   Personal history of colonic polyps 12/07/2020   Pneumonia    Pneumothorax 04/12/2020   Polypharmacy 04/09/2018   Porokeratosis 01/13/2019   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[place])    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[place]) 12/07/2020   Pulmonary nodules 06/08/2016   Pure hypercholesterolemia 12/07/2020   Renal cyst 12/07/2020   Sleep disorder 04/08/2018   Status post bronchoscopy 04/12/2020   Tear of rotator cuff 04/09/2018   Tobacco user [id number]   Uncomplicated alcohol dependence ([place]) 04/09/2018   Unspecified rotator cuff tear or rupture of unspecified shoulder, not specified as traumatic 12/07/2020   Unspecified tear of unspecified meniscus, current injury, unspecified knee, initial encounter 12/07/2020   Visual disturbance 12/07/2020   Vitamin D deficiency 04/08/2018   Past Surgical History:  Procedure Laterality Date   BRONCHIAL BIOPSY  10/13/2019   Procedure: BRONCHIAL BIOPSIES;  Surgeon: Collene Gobble, MD;  Location: [facility];  Service: Pulmonary;;   BRONCHIAL BIOPSY  04/12/2020   Procedure: BRONCHIAL BIOPSIES;  Surgeon: Collene Gobble, MD;  Location: [facility];  Service: Pulmonary;;   BRONCHIAL BRUSHINGS  10/13/2019   Procedure: BRONCHIAL BRUSHINGS;  Surgeon: Collene Gobble, MD;  Location: [facility] ENDOSCOPY;  Service: Pulmonary;;   BRONCHIAL BRUSHINGS  04/12/2020   Procedure: BRONCHIAL BRUSHINGS;  Surgeon: Collene Gobble, MD;  Location: [facility] ENDOSCOPY;  Service: Pulmonary;;   BRONCHIAL NEEDLE ASPIRATION BIOPSY  10/13/2019   Procedure: BRONCHIAL NEEDLE ASPIRATION BIOPSIES;  Surgeon: Collene Gobble, MD;  Location: [facility] ENDOSCOPY;  Service: Pulmonary;;   BRONCHIAL NEEDLE  ASPIRATION BIOPSY  04/12/2020   Procedure: BRONCHIAL NEEDLE ASPIRATION BIOPSIES;  Surgeon: Collene Gobble, MD;  Location: [facility] ENDOSCOPY;  Service: Pulmonary;;   BRONCHIAL WASHINGS  10/13/2019   Procedure: BRONCHIAL WASHINGS;  Surgeon: Collene Gobble, MD;  Location[facility] ENDOSCOPY;  Service: Pulmonary;;   BRONCHIAL WASHINGS  04/12/2020   Procedure: BRONCHIAL WASHINGS;  Surgeon: Collene Gobble, MD;  Location: [facility] ENDOSCOPY;  Service: Pulmonary;;   CARDIAC CATHETERIZATION  2002   50% RCA   CATARACT EXTRACTION, BILATERAL     CORONARY STENT INTERVENTION N/A 04/15/2018   Procedure: CORONARY STENT INTERVENTION;  Surgeon: Jettie Booze, MD;  Location: [facility] CV LAB;  Service: Cardiovascular;  Laterality: N/A;  om1   EYE SURGERY     KNEE ARTHROSCOPY     LEFT HEART CATH AND CORONARY ANGIOGRAPHY N/A 04/15/2018   Procedure: LEFT HEART CATH AND CORONARY ANGIOGRAPHY;  Surgeon: Jettie Booze, MD;  Location: [facility] CV LAB;  Service: Cardiovascular;  Laterality: N/A;   MULTIPLE TOOTH EXTRACTIONS     TONSILLECTOMY     VIDEO BRONCHOSCOPY  04/12/2020   VIDEO BRONCHOSCOPY WITH ENDOBRONCHIAL NAVIGATION N/A 07/26/2016   Procedure: VIDEO BRONCHOSCOPY WITH ENDOBRONCHIAL NAVIGATION;  Surgeon: Collene Gobble, MD;  Location: [facility] OR;  Service: Thoracic;  Laterality: N/A;   VIDEO BRONCHOSCOPY WITH ENDOBRONCHIAL NAVIGATION N/A 10/13/2019   Procedure: [facility] AND VIDEO BRONCHOSCOPY WITH ENDOBRONCHIAL NAVIGATION;  Surgeon: Collene Gobble, MD;  Location: [facility] ENDOSCOPY;  Service: Pulmonary;  Laterality: N/A;   VIDEO BRONCHOSCOPY WITH ENDOBRONCHIAL NAVIGATION N/A 04/12/2020   Procedure: VIDEO BRONCHOSCOPY WITH ENDOBRONCHIAL NAVIGATION;  Surgeon: Collene Gobble, MD;  Location: [facility] ENDOSCOPY;  Service: Pulmonary;  Laterality: N/A;    Allergies  Allergen Reactions   Flagyl [Metronidazole] Other (See Comments)    Other reaction(s): skin reaction    Outpatient Encounter Medications as of 09/03/2022  Medication  Sig   acetaminophen (TYLENOL) 325 MG tablet Take 650 mg by mouth every 4 (four) hours as needed.   albuterol (PROVENTIL) (2.5 MG/3ML) 0.083% nebulizer solution Take 3 mLs (2.5 mg total) by nebulization every 6 (six) hours as needed for wheezing or shortness of breath.   albuterol (VENTOLIN HFA) 108 (90 Base) MCG/ACT inhaler Inhale 2 puffs into the lungs 3 (three) times daily as needed for wheezing or shortness of breath.   ammonium lactate (AMLACTIN) 12 % cream Apply topically at bedtime.   apixaban (ELIQUIS) 5 MG TABS tablet Take 1 tablet (5 mg total) by mouth 2 (two) times daily.   Budeson-Glycopyrrol-Formoterol (BREZTRI AEROSPHERE) 160-9-4.8 MCG/ACT AERO Inhale 2 puffs into the lungs 2 (two) times daily.   clonazePAM (KLONOPIN) 0.5 MG tablet Take 1 tablet (0.5 mg total) by mouth daily as needed for anxiety.   diltiazem (CARDIZEM CD) 120 MG 24 hr capsule Take 1 capsule (120 mg total) by mouth 2 (two) times daily.   furosemide (LASIX) 20 MG tablet Take one tablet (20 mg) by mouth daily with an addition tablet to equal (40 mg) every other day.   gabapentin (NEURONTIN) 100 MG capsule Take 100 mg by mouth 2 (two) times daily.   Infant Care Products [facility] EX) Apply to sacrum three times daily and as needed   ketoconazole (NIZORAL) 2 % cream Apply topically 2 (two) times daily.   Melatonin 5 MG CAPS Take 5 mg by mouth at bedtime.   nitroGLYCERIN (NITROSTAT) 0.4 MG SL tablet Place 1 tablet (0.4 mg total) under the tongue every 5 (five) minutes as needed for chest pain.   OXYGEN Inhale into the lungs. 2 liters upon exertion and 2 liters continuous at night.   pantoprazole (PROTONIX) 40 MG tablet Take 40 mg by mouth daily.   polyethylene glycol powder (GLYCOLAX/MIRALAX) 17 GM/SCOOP powder Take 255 g by mouth daily.   rosuvastatin (CRESTOR) 20 MG tablet Take 1 tablet (20 mg total) by mouth at bedtime.   tamsulosin (FLOMAX) 0.4 MG CAPS capsule Take 0.4 mg by mouth every other day.   [DISCONTINUED]  predniSONE (DELTASONE) 10 MG tablet Advised to wean prednisone by 5 mg daily and then dc.   No facility-administered encounter medications on file as of 09/03/2022.    Review of Systems  Immunization History  Administered Date(s) Administered   Fluad Quad(high Dose 65+) 04/13/2020, 03/29/2021, 05/18/2022   Influenza Split 04/19/2009, 04/26/2010, 05/14/2011, 05/06/2015, 02/28/2019, 04/13/2020   Influenza, High Dose Seasonal PF 04/02/2018, 03/11/2019, 04/13/2020   Influenza,inj,Quad PF,6+ Mos 03/30/2016   PFIZER(Purple Top)SARS-COV-2 Vaccination 08/13/2019, 09/01/2019, 05/24/2020, 09/13/2020   Pfizer Covid-19 Vaccine Bivalent Booster 87yr & up 04/20/2021, 05/27/2021   Pneumococcal Conjugate-13 04/29/2014   Pneumococcal Polysaccharide-23 09/12/2005, 05/01/2013   Zoster, Live 04/13/2008   Pertinent  Health Maintenance Due  Topic Date Due   INFLUENZA VACCINE  Completed  04/12/2020    8:00 PM 04/13/2020    9:00 AM 04/13/2020   12:00 PM 09/12/2020   11:17 AM 08/12/2022    1:02 PM  [place] in the past year?    0   (RETIRED) Patient Fall Risk Level Moderate fall risk Moderate fall risk Moderate fall risk Moderate fall risk Low fall risk  Patient at Risk for Falls Due to    Medication side effect   Fall risk Follow up    Falls prevention discussed;Education provided    Functional Status Survey:    Vitals:   09/03/22 1445 09/03/22 1603  BP: (!) 148/70 107/66  Pulse: 79   Resp: (!) 24   Temp: 97.9 F (36.6 C)   SpO2: 98%   Weight: 157 lb 3.2 oz (71.3 kg)   Height: 5' 10.5" (1.791 m)    Body mass index is 22.24 kg/m. Physical Exam Vitals reviewed.  Cardiovascular:     Rate and Rhythm: Normal rate.     Pulses: Normal pulses.  Abdominal:     Palpations: Abdomen is soft.  Musculoskeletal:     Comments: 2+ pitting edema in bilateral lower extremities  Neurological:     Mental Status: He is alert and oriented to person, place, and time.     Labs  reviewed: Recent Labs    08/12/22 1224 08/13/22 0303 08/16/22 0146 08/19/22 0637 08/20/22 0908 08/27/22 0000 08/30/22 1526  NA 140   < > 136 137  --  138 138  K 3.8   < > 4.8 5.1 4.6  --  5.1  CL 101   < > 99 99  --  100 98  CO2 31   < > 32 36*  --  34* 29  GLUCOSE 100*   < > 153* 119*  --   --  111*  BUN 25*   < > 47* 32*  --  31* 30*  CREATININE 1.21   < > 1.10 0.89  --  0.9 1.14  CALCIUM 8.7*   < > 8.4* 8.3*  --  8.4* 8.2*  MG 2.0  --  2.4  --   --   --   --   PHOS  --   --  4.1  --   --   --   --    < > = values in this interval not displayed.   Recent Labs    04/03/22 0921 08/12/22 1224  AST 25 22  ALT 15 14  ALKPHOS 65 50  BILITOT 0.4 0.9  PROT 6.1 6.4*  ALBUMIN 4.2 3.6   Recent Labs    05/30/22 1106 08/12/22 1224 08/13/22 0303 08/16/22 0146 08/19/22 0637 08/27/22 0000 08/30/22 1526  WBC 14.2* 13.6*   < > 10.8* 13.2* 14.0 13.4*  NEUTROABS 12.4* 10.6*  --   --   --  11,718.00  --   HGB 15.3 12.5*   < > 11.0* 11.4* 13.0* 11.7*  HCT 46.1 39.7   < > 33.4* 35.4* 38* 36.6*  MCV 93.3 98.8   < > 94.9 96.7  --  97.6  PLT 332.0 317   < > 260 230 259 156   < > = values in this interval not displayed.   Lab Results  Component Value Date   TSH 0.700 08/12/2022   Lab Results  Component Value Date   HGBA1C 5.0 08/13/2022   Lab Results  Component Value Date   CHOL 141 08/13/2022   HDL 80 08/13/2022  LDLCALC 52 08/13/2022   TRIG 46 08/13/2022   CHOLHDL 1.8 08/13/2022    Significant Diagnostic Results in last 30 days:  DG Chest Port 1 View  Result Date: 08/18/2022 CLINICAL DATA:  Shortness of breath. EXAM: PORTABLE CHEST 1 VIEW COMPARISON:  08/17/2022, 08/17/2022 and 06/18/2022 FINDINGS: Lungs are adequately inflated with mild emphysematous disease over the upper lungs. Minimal stable prominence of the central bronchovascular markings which may be due to minimal vascular congestion versus viral bronchopneumonia. No lobar airspace consolidation or effusion.  No pneumothorax. Cardiomediastinal silhouette and remainder of the exam is unchanged. IMPRESSION: 1. Minimal stable prominence of the central bronchovascular markings which may be due to minimal vascular congestion versus viral bronchopneumonia. 2. Emphysematous disease. Electronically Signed   By: Marin Olp M.D.   On: 08/18/2022 14:37   DG Chest Port 1 View  Result Date: 08/17/2022 CLINICAL DATA:  Shortness of breath starting this morning. History of COPD. EXAM: PORTABLE CHEST 1 VIEW COMPARISON:  08/12/2022 and CT chest 03/07/2022 FINDINGS: Tapering of the peripheral pulmonary vasculature favors emphysema. Heart size within normal limits. Stable scarring in the right mid lung. No edema observed. Mild chronic interstitial accentuation. Airway thickening is present, suggesting bronchitis or reactive airways disease. Bilateral degenerative AC joint spurring. IMPRESSION: 1. Airway thickening is present, suggesting bronchitis or reactive airways disease. 2. Stable scarring in the right mid lung. 3. Emphysema. 4. Mild chronic interstitial accentuation. Electronically Signed   By: Van Clines M.D.   On: 08/17/2022 10:10   DG Chest Port 1 View  Result Date: 08/12/2022 CLINICAL DATA:  Shortness of breath. EXAM: PORTABLE CHEST 1 VIEW COMPARISON:  Two-view chest x-ray 07/18/2022 FINDINGS: Heart size is normal. Changes of COPD again noted. No superimposed airspace disease or edema is present. No significant effusions are present. Overlying defibrillator pads and leads are noted. IMPRESSION: 1. No acute cardiopulmonary disease. 2. COPD. Electronically Signed   By: San Morelle M.D.   On: 08/12/2022 12:54   ECHOCARDIOGRAM COMPLETE  Result Date: 08/07/2022    ECHOCARDIOGRAM REPORT   Patient Name:   Taurian Ley. Date of Exam: 08/06/2022 Medical Rec #:  [id number]            Height:       70.5 in Accession #:    [id number]           Weight:       160.0 lb Date of Birth:  1941-03-11            BSA:           1.909 m Patient Age:    73 years             BP:           110/60 mmHg Patient Gender: M                    HR:           117 bpm. Exam Location:  [place] Procedure: 2D Echo, Cardiac Doppler and Color Doppler Indications:    R06.00 Dyspnea  History:        Patient has no prior history of Echocardiogram examinations.                 CAD, COPD; Risk Factors:Former Smoker, Hypertension and                 Dyslipidemia. Dyspnea on exertion. Oxygen dependent.  Sonographer:    Diamond Nickel RCS Referring  Phys: [place]  1. Tachycardia noted. Left ventricular ejection fraction, by estimation, is 65 to 70%. The left ventricle has normal function. The left ventricle has no regional wall motion abnormalities. There is mild left ventricular hypertrophy. Left ventricular diastolic parameters are indeterminate. There is the interventricular septum is flattened in systole and diastole, consistent with right ventricular pressure and volume overload.  2. Right ventricular systolic function is normal. The right ventricular size is normal. Tricuspid regurgitation signal is inadequate for assessing PA pressure.  3. The mitral valve is normal in structure. No evidence of mitral valve regurgitation. No evidence of mitral stenosis.  4. The aortic valve is normal in structure. Aortic valve regurgitation is not visualized. No aortic stenosis is present.  5. The inferior vena cava is normal in size with greater than 50% respiratory variability, suggesting right atrial pressure of 3 mmHg. FINDINGS  Left Ventricle: Tachycardia noted. Left ventricular ejection fraction, by estimation, is 65 to 70%. The left ventricle has normal function. The left ventricle has no regional wall motion abnormalities. The left ventricular internal cavity size was normal in size. There is mild left ventricular hypertrophy. The interventricular septum is flattened in systole and diastole, consistent with right ventricular  pressure and volume overload. Left ventricular diastolic parameters are indeterminate. Right Ventricle: The right ventricular size is normal. No increase in right ventricular wall thickness. Right ventricular systolic function is normal. Tricuspid regurgitation signal is inadequate for assessing PA pressure. Left Atrium: Left atrial size was normal in size. Right Atrium: Right atrial size was normal in size. Pericardium: There is no evidence of pericardial effusion. Mitral Valve: The mitral valve is normal in structure. No evidence of mitral valve regurgitation. No evidence of mitral valve stenosis. Tricuspid Valve: The tricuspid valve is normal in structure. Tricuspid valve regurgitation is not demonstrated. No evidence of tricuspid stenosis. Aortic Valve: The aortic valve is normal in structure. Aortic valve regurgitation is not visualized. No aortic stenosis is present. Pulmonic Valve: The pulmonic valve was normal in structure. Pulmonic valve regurgitation is not visualized. No evidence of pulmonic stenosis. Aorta: The aortic root is normal in size and structure. Venous: The inferior vena cava is normal in size with greater than 50% respiratory variability, suggesting right atrial pressure of 3 mmHg. IAS/Shunts: No atrial level shunt detected by color flow Doppler.  LEFT VENTRICLE PLAX 2D LVIDd:         3.40 cm LVIDs:         2.00 cm LV PW:         1.30 cm LV IVS:        1.10 cm LVOT diam:     2.30 cm LV SV:         75 LV SV Index:   40 LVOT Area:     4.15 cm  RIGHT VENTRICLE RV Basal diam:  2.20 cm RV S prime:     13.67 cm/s TAPSE (M-mode): 2.0 cm LEFT ATRIUM             Index        RIGHT ATRIUM          Index LA diam:        2.90 cm 1.52 cm/m   RA Area:     9.20 cm LA Vol (A2C):   47.1 ml 24.68 ml/m  RA Volume:   15.20 ml 7.96 ml/m LA Vol (A4C):   25.2 ml 13.20 ml/m LA Biplane Vol: 35.4 ml 18.55 ml/m  AORTIC  VALVE LVOT Vmax:   107.13 cm/s LVOT Vmean:  75.233 cm/s LVOT VTI:    0.182 m  AORTA Ao Root  diam: 3.70 cm Ao Asc diam:  3.90 cm  SHUNTS Systemic VTI:  0.18 m Systemic Diam: 2.30 cm Candee Furbish MD Electronically signed by Candee Furbish MD Signature Date/Time: 08/07/2022/10:13:56 AM    Final     Assessment/Plan Chronic respiratory failure with hypoxia and hypercapnia (HCC)  Panlobular emphysema (HCC)  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[place])  Peripheral edema Patient has increased leg swelling. Plan for dose of lasix. Compression bilateral legs and elevation when possible. Continue [place] conversations and discussion regarding prognosis.    Family/ staff Communication: nursing  Labs/tests ordered:  none

## 2022-09-04 ENCOUNTER — Telehealth: Payer: Self-pay

## 2022-09-04 DIAGNOSIS — I5033 Acute on chronic diastolic (congestive) heart failure: Secondary | ICD-10-CM

## 2022-09-04 NOTE — Telephone Encounter (Signed)
-----   Message from [place], PA-C sent at 09/03/2022  8:34 AM EST ----- Overall labs look stable. We can repeat BMET in 2 weeks.

## 2022-09-04 NOTE — Telephone Encounter (Signed)
Called patient.  Left detailed message on VM - okay per DPR.  Lab orders placed.

## 2022-09-05 ENCOUNTER — Telehealth: Payer: Self-pay

## 2022-09-05 NOTE — Telephone Encounter (Signed)
Called patient.  No answer. Left detailed message on VM - okay per DPR.   Calling to see if we can move patients cardioversion to 09/17/2022.

## 2022-09-05 NOTE — Telephone Encounter (Signed)
Spoke with patients wife she states patient is currently in rehab so he had to cancel his upcoming Chest CT. Wife states once he is home and doing well they will call back to him rescheduled for a CT and a f/u with Dr. Lamonte Sakai. Nothing further needed at this time.

## 2022-09-06 ENCOUNTER — Non-Acute Institutional Stay (SKILLED_NURSING_FACILITY): Payer: Medicare Other | Admitting: Nurse Practitioner

## 2022-09-06 ENCOUNTER — Encounter: Payer: Self-pay | Admitting: Nurse Practitioner

## 2022-09-06 ENCOUNTER — Other Ambulatory Visit: Payer: Medicare Other

## 2022-09-06 DIAGNOSIS — I4891 Unspecified atrial fibrillation: Secondary | ICD-10-CM

## 2022-09-06 DIAGNOSIS — E441 Mild protein-calorie malnutrition: Secondary | ICD-10-CM | POA: Diagnosis not present

## 2022-09-06 DIAGNOSIS — J449 Chronic obstructive pulmonary disease, unspecified: Secondary | ICD-10-CM | POA: Diagnosis not present

## 2022-09-06 DIAGNOSIS — J9611 Chronic respiratory failure with hypoxia: Secondary | ICD-10-CM | POA: Diagnosis not present

## 2022-09-06 DIAGNOSIS — R600 Localized edema: Secondary | ICD-10-CM

## 2022-09-06 NOTE — Telephone Encounter (Signed)
Cardioversion has been rescheduled for 09/17/2022

## 2022-09-06 NOTE — Progress Notes (Signed)
Location:  Other Nursing Home Room Number: 120A Place of Service:  SNF (31)  [name], MD  Patient Care Team: [name], MD as PCP - General (Internal Medicine) Antony Madura, PA-C as PCP - Cardiology (Cardiology)  Extended Emergency Contact Information Primary Emergency Contact: Wainer,Susan Address: [place], [place] [address] [place]net[place]itter of [place] Phone: [phone] Relation: Spouse  Goals of care: Advanced Directive information    09/07/2022   10:29 AM  Advanced Directives  Does Patient Have a Medical Advance Directive? Yes  Type of Advance Directive Out of facility DNR (pink MOST or yellow form)  Does patient want to make changes to medical advance directive? No - Patient declined     Chief Complaint  Patient presents with   Acute Visit    Shortness of breath and lower extremity edema    HPI:  Pt is a 82 y.o. male seen today for an acute visit for shortness of breath.  He has COPD on chronic O2 was on 2L a year ago and now needing 3L at baseline.  Continues on breztri BID and duoneb PRN however not using duoneb at this time.   Yesterday and this morning he woke up and was very short of breath. This happened after minimal activity.  He reports now he feels like his breathing is at baseline. He is ready to go home tomorrow.  He walked entire length of hall without issue.   Reports his feet and legs have been swollen for over a month. He has been complaining about this for over a month. Now weeping.   Plans to follow up with cardiology and have cardioversion next week.    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   Chronic obstructive pulmonary disease with (acute) exacerbation ([place]) 12/07/2020   Chronic respiratory failure with hypoxia ([place]) 08/10/2019   Chronic sinusitis 12/07/2020   Cigarette nicotine dependence, uncomplicated [id number]   Coagulation disorder ([place]) 01/13/2019   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   Contracture of palmar fascia 12/07/2020   COPD (chronic obstructive pulmonary disease) ([place])    Corn of toe 12/07/2020   Corns and callosities 04/09/2018   Coronary artery disease    2019 with stents   Coronary atherosclerosis 05/07/2018   Degenerative disc disease, 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   Degenerative disc disease, 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   Diarrhea 04/09/2018   Double vision with both eyes open 12/07/2020   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   Dysphagia 08/14/2018   Dyspnea    ED (erectile dysfunction)    ED (erectile dysfunction) of organic origin 12/07/2020   Elevated prostate specific antigen (PSA) 04/08/2018   Elevated PSA    Encounter for screening for other disorder 12/07/2020   Enlarged prostate without lower urinary tract symptoms (luts) 04/09/2018   Enthesopathy 12/07/2020   Essential (primary) hypertension 04/08/2018  Ex-smoker 04/09/2018   Exertional dyspnea 04/02/2018   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   Flatulence 04/08/2018   Gastro-esophageal reflux disease without esophagitis 04/08/2018   GERD (gastroesophageal reflux disease)    History of acute otitis externa 08/14/2018   Hyperlipidemia    Hypertension    Hypoxemia 12/07/2020   Kidney cysts    Left knee pain 12/07/2020   Low back pain 04/08/2018   Male erectile dysfunction 04/09/2018   Mixed  hyperlipidemia 04/08/2018   Nocturia more than twice per night 04/09/2018   Osteoarthritis of first carpometacarpal joint 04/08/2018   Osteoarthritis of knee 12/07/2020   Other long term (current) drug therapy 12/07/2020   Pain due to onychomycosis of toenail of left foot 07/20/2019   Pain in joint 04/09/2018   Pain in knee 04/09/2018   Pain in unspecified knee 12/07/2020   Pain of finger 04/09/2018   Palpitations    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[place]) 12/07/2020   Personal history of colonic polyps 12/07/2020   Pneumonia    Pneumothorax 04/12/2020   Polypharmacy 04/09/2018   Porokeratosis 01/13/2019   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[place])    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   Pulmonary nodules 06/08/2016   Pure hypercholesterolemia 12/07/2020   Renal cyst 12/07/2020   Sleep disorder 04/08/2018   Status post bronchoscopy 04/12/2020   Tear of rotator cuff 04/09/2018   Tobacco user [id number]   Uncomplicated alcohol dependence ([place]) 04/09/2018   Unspecified rotator cuff tear or rupture of unspecified shoulder, not specified as traumatic 12/07/2020   Unspecified tear of unspecified meniscus, current injury, unspecified knee, initial encounter 12/07/2020   Visual disturbance 12/07/2020   Vitamin D deficiency 04/08/2018   Past Surgical History:  Procedure Laterality Date   BRONCHIAL BIOPSY  10/13/2019   Procedure: BRONCHIAL BIOPSIES;  Surgeon: Collene Gobble, MD;  Location: [facility];  Service: Pulmonary;;   BRONCHIAL BIOPSY  04/12/2020   Procedure: BRONCHIAL BIOPSIES;  Surgeon: Collene Gobble, MD;  Location: [facility];  Service: Pulmonary;;   BRONCHIAL BRUSHINGS  10/13/2019   Procedure: BRONCHIAL BRUSHINGS;  Surgeon: Collene Gobble, MD;  Location: [facility] ENDOSCOPY;  Service: Pulmonary;;   BRONCHIAL BRUSHINGS  04/12/2020   Procedure: BRONCHIAL BRUSHINGS;  Surgeon: Collene Gobble, MD;  Location: [facility] ENDOSCOPY;  Service: Pulmonary;;   BRONCHIAL NEEDLE ASPIRATION BIOPSY  10/13/2019   Procedure:  BRONCHIAL NEEDLE ASPIRATION BIOPSIES;  Surgeon: Collene Gobble, MD;  Location: [facility] ENDOSCOPY;  Service: Pulmonary;;   BRONCHIAL NEEDLE ASPIRATION BIOPSY  04/12/2020   Procedure: BRONCHIAL NEEDLE ASPIRATION BIOPSIES;  Surgeon: Collene Gobble, MD;  Location: [facility] ENDOSCOPY;  Service: Pulmonary;;   BRONCHIAL WASHINGS  10/13/2019   Procedure: BRONCHIAL WASHINGS;  Surgeon: Collene Gobble, MD;  [facility] END[facility];  Service: Pulmonary;;   BRONCHIAL WASHINGS  04/12/2020   Procedure: BRONCHIAL WASHINGS;  Surgeon: Collene Gobble, MD;  Location: [facility] ENDOSCOPY;  Service: Pulmonary;;   CARDIAC CATHETERIZATION  2002   50% RCA   CATARACT EXTRACTION, BILATERAL     CORONARY STENT INTERVENTION N/A 04/15/2018   Procedure: CORONARY STENT INTERVENTION;  Surgeon: Jettie Booze, MD;  Location: [facility];  Service: Cardiovascular;  Laterality: N/A;  om1   EYE SURGERY     KNEE ARTHROSCOPY     LEFT HEART CATH AND CORONARY ANGIOGRAPHY N/A 04/15/2018   Procedure: LEFT HEART CATH AND CORONARY ANGIOGRAPHY;  Surgeon: Jettie Booze, MD;  Location: [facility];  Service: Cardiovascular;  Laterality: N/A;   MULTIPLE TOOTH EXTRACTIONS  TONSILLECTOMY     VIDEO BRONCHOSCOPY  04/12/2020   VIDEO BRONCHOSCOPY WITH ENDOBRONCHIAL NAVIGATION N/A 07/26/2016   Procedure: VIDEO BRONCHOSCOPY WITH ENDOBRONCHIAL NAVIGATION;  Surgeon: Collene Gobble, MD;  Location: [facility];  Service: Thoracic;  Laterality: N/A;   VIDEO BRONCHOSCOPY WITH ENDOBRONCHIAL NAVIGATION N/A 10/13/2019   Procedure: [facility] AND VIDEO BRONCHOSCOPY WITH ENDOBRONCHIAL NAVIGATION;  Surgeon: Collene Gobble, MD;  Location: [facility] ENDOSCOPY;  Service: Pulmonary;  Laterality: N/A;   VIDEO BRONCHOSCOPY WITH ENDOBRONCHIAL NAVIGATION N/A 04/12/2020   Procedure: VIDEO BRONCHOSCOPY WITH ENDOBRONCHIAL NAVIGATION;  Surgeon: Collene Gobble, MD;  Location: [facility] ENDOSCOPY;  Service: Pulmonary;  Laterality: N/A;    Allergies  Allergen Reactions   Flagyl  [Metronidazole] Other (See Comments)    Other reaction(s): skin reaction    Outpatient Encounter Medications as of 09/06/2022  Medication Sig   acetaminophen (TYLENOL) 325 MG tablet Take 650 mg by mouth every 4 (four) hours as needed.   albuterol (PROVENTIL) (2.5 MG/3ML) 0.083% nebulizer solution Take 3 mLs (2.5 mg total) by nebulization every 6 (six) hours as needed for wheezing or shortness of breath.   albuterol (VENTOLIN HFA) 108 (90 Base) MCG/ACT inhaler Inhale 2 puffs into the lungs 3 (three) times daily as needed for wheezing or shortness of breath.   ammonium lactate (AMLACTIN) 12 % cream Apply topically at bedtime.   apixaban (ELIQUIS) 5 MG TABS tablet Take 1 tablet (5 mg total) by mouth 2 (two) times daily.   Budeson-Glycopyrrol-Formoterol (BREZTRI AEROSPHERE) 160-9-4.8 MCG/ACT AERO Inhale 2 puffs into the lungs 2 (two) times daily.   clonazePAM (KLONOPIN) 0.5 MG tablet Take 1 tablet (0.5 mg total) by mouth daily as needed for anxiety.   diltiazem (CARDIZEM CD) 120 MG 24 hr capsule Take 1 capsule (120 mg total) by mouth 2 (two) times daily.   furosemide (LASIX) 20 MG tablet Take one tablet (20 mg) by mouth daily with an addition tablet to equal (40 mg) every other day.   gabapentin (NEURONTIN) 100 MG capsule Take 100 mg by mouth 2 (two) times daily.   Infant Care Products [facility] EX) Apply to sacrum three times daily and as needed   ketoconazole (NIZORAL) 2 % cream Apply topically 2 (two) times daily.   Melatonin 5 MG CAPS Take 5 mg by mouth at bedtime.   nitroGLYCERIN (NITROSTAT) 0.4 MG SL tablet Place 1 tablet (0.4 mg total) under the tongue every 5 (five) minutes as needed for chest pain.   OXYGEN Inhale into the lungs. 2 liters upon exertion and 2 liters continuous at night.   pantoprazole (PROTONIX) 40 MG tablet Take 40 mg by mouth daily.   polyethylene glycol powder (GLYCOLAX/MIRALAX) 17 GM/SCOOP powder Take 255 g by mouth daily.   rosuvastatin (CRESTOR) 20 MG tablet Take 1  tablet (20 mg total) by mouth at bedtime.   [DISCONTINUED] tamsulosin (FLOMAX) 0.4 MG CAPS capsule Take 0.4 mg by mouth daily.   No facility-administered encounter medications on file as of 09/06/2022.    Review of Systems  Constitutional:  Negative for activity change, appetite change, fatigue and unexpected weight change.  HENT:  Negative for congestion and hearing loss.   Eyes: Negative.   Respiratory:  Positive for shortness of breath. Negative for cough.   Cardiovascular:  Positive for leg swelling. Negative for chest pain and palpitations.  Gastrointestinal:  Negative for abdominal pain, constipation and diarrhea.  Genitourinary:  Negative for difficulty urinating and dysuria.  Musculoskeletal:  Negative for arthralgias and myalgias.  Skin:  Negative  for color change and wound.  Neurological:  Negative for dizziness and weakness.  Psychiatric/Behavioral:  Negative for agitation, behavioral problems and confusion.    Immunization History  Administered Date(s) Administered   Fluad Quad(high Dose 65+) 04/13/2020, 03/29/2021, 05/18/2022   Influenza Split 04/19/2009, 04/26/2010, 05/14/2011, 05/06/2015, 02/28/2019, 04/13/2020   Influenza, High Dose Seasonal PF 04/02/2018, 03/11/2019, 04/13/2020   Influenza,inj,Quad PF,6+ Mos 03/30/2016   PFIZER(Purple Top)SARS-COV-2 Vaccination 08/13/2019, 09/01/2019, 05/24/2020, 09/13/2020   Pfizer Covid-19 Vaccine Bivalent Booster 78yr & up 04/20/2021, 05/27/2021   Pneumococcal Conjugate-13 04/29/2014   Pneumococcal Polysaccharide-23 09/12/2005, 05/01/2013   Zoster, Live 04/13/2008   Pertinent  Health Maintenance Due  Topic Date Due   INFLUENZA VACCINE  Completed      04/12/2020    8:00 PM 04/13/2020    9:00 AM 04/13/2020   12:00 PM 09/12/2020   11:17 AM 08/12/2022    1:02 PM  Fall Risk  Falls in the past year?    0   (RETIRED) Patient Fall Risk Level Moderate fall risk Moderate fall risk Moderate fall risk Moderate fall risk Low fall risk   Patient at Risk for Falls Due to    Medication side effect   Fall risk Follow up    Falls prevention discussed;Education provided    Functional Status Survey:    Vitals:   09/06/22 1421  BP: (!) 153/73  Pulse: 82  Resp: (!) 24  Temp: (!) 97.5 F (36.4 C)  SpO2: 92%  Weight: 159 lb (72.1 kg)  Height: 5' 10.5" (1.791 m)   Body mass index is 22.49 kg/m. Physical Exam Constitutional:      General: He is not in acute distress.    Appearance: He is well-developed. He is not diaphoretic.  HENT:     Head: Normocephalic and atraumatic.     Right Ear: External ear normal.     Left Ear: External ear normal.     Mouth/Throat:     Pharynx: No oropharyngeal exudate.  Eyes:     Conjunctiva/sclera: Conjunctivae normal.     Pupils: Pupils are equal, round, and reactive to light.  Cardiovascular:     Rate and Rhythm: Tachycardia present. Rhythm irregular.     Heart sounds: Normal heart sounds.  Pulmonary:     Effort: Pulmonary effort is normal. Tachypnea (states this is baseline) present.     Breath sounds: Normal breath sounds. Decreased air movement present.  Abdominal:     General: Bowel sounds are normal.     Palpations: Abdomen is soft.  Musculoskeletal:        General: No tenderness.     Cervical back: Normal range of motion and neck supple.     Right lower leg: No edema.     Left lower leg: No edema.  Skin:    General: Skin is warm and dry.  Neurological:     Mental Status: He is alert and oriented to person, place, and time.    Labs reviewed: Recent Labs    08/12/22 1224 08/13/22 0303 08/16/22 0146 08/19/22 0637 08/20/22 0908 08/27/22 0000 08/30/22 1526  NA 140   < > 136 137  --  138 138  K 3.8   < > 4.8 5.1 4.6  --  5.1  CL 101   < > 99 99  --  100 98  CO2 31   < > 32 36*  --  34* 29  GLUCOSE 100*   < > 153* 119*  --   --  111*  BUN 25*   < > 47* 32*  --  31* 30*  CREATININE 1.21   < > 1.10 0.89  --  0.9 1.14  CALCIUM 8.7*   < > 8.4* 8.3*  --  8.4* 8.2*   MG 2.0  --  2.4  --   --   --   --   PHOS  --   --  4.1  --   --   --   --    < > = values in this interval not displayed.   Recent Labs    04/03/22 0921 08/12/22 1224  AST 25 22  ALT 15 14  ALKPHOS 65 50  BILITOT 0.4 0.9  PROT 6.1 6.4*  ALBUMIN 4.2 3.6   Recent Labs    05/30/22 1106 08/12/22 1224 08/13/22 0303 08/16/22 0146 08/19/22 0637 08/27/22 0000 08/30/22 1526  WBC 14.2* 13.6*   < > 10.8* 13.2* 14.0 13.4*  NEUTROABS 12.4* 10.6*  --   --   --  11,718.00  --   HGB 15.3 12.5*   < > 11.0* 11.4* 13.0* 11.7*  HCT 46.1 39.7   < > 33.4* 35.4* 38* 36.6*  MCV 93.3 98.8   < > 94.9 96.7  --  97.6  PLT 332.0 317   < > 260 230 259 156   < > = values in this interval not displayed.   Lab Results  Component Value Date   TSH 0.700 08/12/2022   Lab Results  Component Value Date   HGBA1C 5.0 08/13/2022   Lab Results  Component Value Date   CHOL 141 08/13/2022   HDL 80 08/13/2022   LDLCALC 52 08/13/2022   TRIG 46 08/13/2022   CHOLHDL 1.8 08/13/2022    Significant Diagnostic Results in last 30 days:  DG Chest Port 1 View  Result Date: 08/18/2022 CLINICAL DATA:  Shortness of breath. EXAM: PORTABLE CHEST 1 VIEW COMPARISON:  08/17/2022, 08/17/2022 and 06/18/2022 FINDINGS: Lungs are adequately inflated with mild emphysematous disease over the upper lungs. Minimal stable prominence of the central bronchovascular markings which may be due to minimal vascular congestion versus viral bronchopneumonia. No lobar airspace consolidation or effusion. No pneumothorax. Cardiomediastinal silhouette and remainder of the exam is unchanged. IMPRESSION: 1. Minimal stable prominence of the central bronchovascular markings which may be due to minimal vascular congestion versus viral bronchopneumonia. 2. Emphysematous disease. Electronically Signed   By: Marin Olp M.D.   On: 08/18/2022 14:37   DG Chest Port 1 View  Result Date: 08/17/2022 CLINICAL DATA:  Shortness of breath starting this  morning. History of COPD. EXAM: PORTABLE CHEST 1 VIEW COMPARISON:  08/12/2022 and CT chest 03/07/2022 FINDINGS: Tapering of the peripheral pulmonary vasculature favors emphysema. Heart size within normal limits. Stable scarring in the right mid lung. No edema observed. Mild chronic interstitial accentuation. Airway thickening is present, suggesting bronchitis or reactive airways disease. Bilateral degenerative AC joint spurring. IMPRESSION: 1. Airway thickening is present, suggesting bronchitis or reactive airways disease. 2. Stable scarring in the right mid lung. 3. Emphysema. 4. Mild chronic interstitial accentuation. Electronically Signed   By: Van Clines M.D.   On: 08/17/2022 10:10   DG Chest Port 1 View  Result Date: 08/12/2022 CLINICAL DATA:  Shortness of breath. EXAM: PORTABLE CHEST 1 VIEW COMPARISON:  Two-view chest x-ray 07/18/2022 FINDINGS: Heart size is normal. Changes of COPD again noted. No superimposed airspace disease or edema is present. No significant effusions are present. Overlying defibrillator pads and leads are noted.  IMPRESSION: 1. No acute cardiopulmonary disease. 2. COPD. Electronically Signed   By: San Morelle M.D.   On: 08/12/2022 12:54    Assessment/Plan 1. Atrial fibrillation, unspecified type ([place]) -continues to have episodes of tachycardia, plans to have cardioverison on 2/19. Followed by cardiology,  Continues on eliquis for anticoagulation.   2. Bilateral leg edema --encouraged to elevate legs above level of heart as tolerates, low sodium diet -will have staff wrap legs and change daily. To change if wraps become wet   3. Mild protein-calorie malnutrition ([place]) -to add protein supplement daily  4. Chronic respiratory failure with hypoxia ([facility]) -continues on 3L [place], educated to use duoneb PRN for shortness of breath vs increasing in O2  5. Chronic obstructive pulmonary disease, unspecified COPD type ([place]) -continues on breztri twice daily with as  needed duoneb.  Continues on 3L [place]  Mariana Wiederholt K. [place], [facility] [phone]

## 2022-09-07 ENCOUNTER — Encounter: Payer: Self-pay | Admitting: Student

## 2022-09-07 ENCOUNTER — Non-Acute Institutional Stay (SKILLED_NURSING_FACILITY): Payer: Medicare Other | Admitting: Student

## 2022-09-07 DIAGNOSIS — I4811 Longstanding persistent atrial fibrillation: Secondary | ICD-10-CM | POA: Diagnosis not present

## 2022-09-07 DIAGNOSIS — J431 Panlobular emphysema: Secondary | ICD-10-CM | POA: Diagnosis not present

## 2022-09-07 DIAGNOSIS — K219 Gastro-esophageal reflux disease without esophagitis: Secondary | ICD-10-CM

## 2022-09-07 DIAGNOSIS — I4892 Unspecified atrial flutter: Secondary | ICD-10-CM

## 2022-09-07 DIAGNOSIS — L98429 Non-pressure chronic ulcer of back with unspecified severity: Secondary | ICD-10-CM | POA: Diagnosis not present

## 2022-09-07 DIAGNOSIS — I739 Peripheral vascular disease, unspecified: Secondary | ICD-10-CM | POA: Diagnosis not present

## 2022-09-07 DIAGNOSIS — J9611 Chronic respiratory failure with hypoxia: Secondary | ICD-10-CM

## 2022-09-07 NOTE — Progress Notes (Unsigned)
Location:  Other [place].  Nursing Home Room Number: [place] of Service:  SNF [phone])  Provider: Dr. Dewayne Shorter   PCP: [name], MD Patient Care Team: [name], MD as PCP - General (Internal Medicine) Antony Madura, PA-C as PCP - Cardiology (Cardiology)  Extended Emergency Contact Information Primary Emergency Contact: Mccree,Susan Address: [address]          [place], [place] [address] Johnnette Litter of [place] Phone: [phone] Relation: Spouse  Code Status: Full Code Goals of care:  Advanced Directive information    09/07/2022   10:29 AM  Advanced Directives  Does Patient Have a Medical Advance Directive? Yes  Type of Advance Directive Out of facility DNR (pink MOST or yellow form)  Does patient want to make changes to medical advance directive? No - Patient declined     Allergies  Allergen Reactions   Flagyl [Metronidazole] Other (See Comments)    Other reaction(s): skin reaction    Chief Complaint  Patient presents with   Discharge Note    Discharge    HPI:  82 y.o. male  who is returning to his home today. Patient has a history of COPD and new atrial fibrillation. He is on oxygen. Yesterday he had an episode where he was short of breath and struggled to catch his breath. His wife is at bedside. Reminded patient that we were going to discuss his prognosis further. Of note, plugged his information into eprognosis and it stated ~1 year, however, discussed with patient a caveat that he has other signs that he has physical decline including but not limited to - skin break down with sacral wound, and increased swelling in his legs. Patient wonders if anything can be done. When asked what he will do in the event something like yesterday occurs again, he states he will call EMT. Discussed with patient this will likely lead to a hospitalization, which he previously was against his hopes. Discussed medications can help provide  comfort to prevent transfer to the hospital, however, this is typically reserved for people who have hospice status, patient declined. He states he is not ready for this. He asks if any of the conditions he has are reversible like the the leg swelling. Discussed increasing protein intake, leg compression, elevation.     Past Medical History:  Diagnosis Date   Abdominal discomfort 12/07/2020   Acquired trigger finger of right middle finger 02/04/2020   Acute prostatitis 04/08/2018   Alcohol abuse    Alcohol dependence (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   Chronic obstructive pulmonary disease with (acute) exacerbation ([place]) 12/07/2020   Chronic respiratory failure with hypoxia ([place]) 08/10/2019   Chronic sinusitis 12/07/2020   Cigarette nicotine dependence, uncomplicated [id number]   Coagulation disorder ([place]) 01/13/2019   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   Contracture of palmar fascia 12/07/2020   COPD (chronic obstructive pulmonary disease) ([place])    Corn of toe 12/07/2020   Corns and callosities 04/09/2018   Coronary artery disease    2019 with stents   Coronary atherosclerosis 05/07/2018  Degenerative disc disease, 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   Degenerative disc disease, 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   Diarrhea 04/09/2018   Double vision with both eyes open 12/07/2020   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   Dysphagia 08/14/2018   Dyspnea    ED (erectile dysfunction)    ED (erectile dysfunction) of organic origin 12/07/2020   Elevated prostate specific antigen (PSA) 04/08/2018   Elevated PSA    Encounter for  screening for other disorder 12/07/2020   Enlarged prostate without lower urinary tract symptoms (luts) 04/09/2018   Enthesopathy 12/07/2020   Essential (primary) hypertension 04/08/2018   Ex-smoker 04/09/2018   Exertional dyspnea 04/02/2018   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   Flatulence 04/08/2018   Gastro-esophageal reflux disease without esophagitis 04/08/2018   GERD (gastroesophageal reflux disease)    History of acute otitis externa 08/14/2018   Hyperlipidemia    Hypertension    Hypoxemia 12/07/2020   Kidney cysts    Left knee pain 12/07/2020   Low back pain 04/08/2018   Male erectile dysfunction 04/09/2018   Mixed hyperlipidemia 04/08/2018   Nocturia more than twice per night 04/09/2018   Osteoarthritis of first carpometacarpal joint 04/08/2018   Osteoarthritis of knee 12/07/2020   Other long term (current) drug therapy 12/07/2020   Pain due to onychomycosis of toenail of left foot 07/20/2019   Pain in joint 04/09/2018   Pain in knee 04/09/2018   Pain in unspecified knee 12/07/2020   Pain of finger 04/09/2018   Palpitations    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[place]) 12/07/2020   Personal history of colonic polyps 12/07/2020   Pneumonia    Pneumothorax 04/12/2020   Polypharmacy 04/09/2018   Porokeratosis 01/13/2019   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[place])    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[place]) 12/07/2020   Pulmonary nodules 06/08/2016   Pure hypercholesterolemia 12/07/2020   Renal cyst 12/07/2020   Sleep disorder 04/08/2018   Status post bronchoscopy 04/12/2020   Tear of rotator cuff 04/09/2018   Tobacco user [id number]   Uncomplicated alcohol dependence ([place]) 04/09/2018   Unspecified rotator cuff tear or rupture of unspecified shoulder, not specified as traumatic 12/07/2020   Unspecified tear of unspecified meniscus, current injury, unspecified knee, initial encounter 12/07/2020   Visual disturbance 12/07/2020   Vitamin D deficiency 04/08/2018    Past Surgical History:   Procedure Laterality Date   BRONCHIAL BIOPSY  10/13/2019   Procedure: BRONCHIAL BIOPSIES;  Surgeon: Collene Gobble, MD;  Location: [place];  Service: Pulmonary;;   BRONCHIAL BIOPSY  04/12/2020   Procedure: BRONCHIAL BIOPSIES;  Surgeon: Collene Gobble, MD;  Location: [place];  Service: Pulmonary;;   BRONCHIAL BRUSHINGS  10/13/2019   Procedure: BRONCHIAL BRUSHINGS;  Surgeon: Collene Gobble, MD;  Location: [facility] ENDOSCOPY;  Service: Pulmonary;;   BRONCHIAL BRUSHINGS  04/12/2020   Procedure: BRONCHIAL BRUSHINGS;  Surgeon: Collene Gobble, MD;  Location: [facility] ENDOSCOPY;  Service: Pulmonary;;   BRONCHIAL NEEDLE ASPIRATION BIOPSY  10/13/2019   Procedure: BRONCHIAL NEEDLE ASPIRATION BIOPSIES;  Surgeon: Collene Gobble, MD;  Location: [facility] ENDOSCOPY;  Service: Pulmonary;;   BRONCHIAL NEEDLE ASPIRATION BIOPSY  04/12/2020   Procedure: BRONCHIAL NEEDLE ASPIRATION BIOPSIES;  Surgeon: Collene Gobble, MD;  Location: [facility] ENDOSCOPY;  Service: Pulmonary;;   BRONCHIAL WASHINGS  10/13/2019   Procedure: BRONCHIAL WASHINGS;  Surgeon: Collene Gobble, MD;  Location: [facility] ENDOSCOPY;  Service: Pulmonary;;   BRONCHIAL WASHINGS  04/12/2020   Procedure: BRONCHIAL WASHINGS;  Surgeon: Collene Gobble, MD;  Location: [facility] ENDOSCOPY;  Service: Pulmonary;;   CARDIAC CATHETERIZATION  2002   50% RCA   CATARACT EXTRACTION, BILATERAL     CORONARY STENT INTERVENTION N/A 04/15/2018   Procedure: CORONARY STENT INTERVENTION;  Surgeon: Jettie Booze, MD;  Location: [place] CV LAB;  Service: Cardiovascular;  Laterality: N/A;  om1   EYE SURGERY     KNEE ARTHROSCOPY     LEFT HEART CATH AND CORONARY ANGIOGRAPHY N/A 04/15/2018   Procedure: LEFT HEART CATH AND CORONARY ANGIOGRAPHY;  Surgeon: Jettie Booze, MD;  Location: [place] CV LAB;  Service: Cardiovascular;  Laterality: N/A;   MULTIPLE TOOTH EXTRACTIONS     TONSILLECTOMY     VIDEO BRONCHOSCOPY  04/12/2020   VIDEO BRONCHOSCOPY WITH ENDOBRONCHIAL NAVIGATION N/A  07/26/2016   Procedure: VIDEO BRONCHOSCOPY WITH ENDOBRONCHIAL NAVIGATION;  Surgeon: Collene Gobble, MD;  Location: [place];  Service: Thoracic;  Laterality: N/A;   VIDEO BRONCHOSCOPY WITH ENDOBRONCHIAL NAVIGATION N/A 10/13/2019   Procedure: [facility] AND VIDEO BRONCHOSCOPY WITH ENDOBRONCHIAL NAVIGATION;  Surgeon: Collene Gobble, MD;  Location: [place] ENDOSCOPY;  Service: Pulmonary;  Laterality: N/A;   VIDEO BRONCHOSCOPY WITH ENDOBRONCHIAL NAVIGATION N/A 04/12/2020   Procedure: VIDEO BRONCHOSCOPY WITH ENDOBRONCHIAL NAVIGATION;  Surgeon: Collene Gobble, MD;  Location: [place] ENDOSCOPY;  Service: Pulmonary;  Laterality: N/A;      reports that he quit smoking about 2 years ago. His smoking use included cigarettes. He started smoking about 70 years ago. He has a 50.25 pack-year smoking history. He has never been exposed to tobacco smoke. He has never used smokeless tobacco. He reports that he does not drink alcohol and does not use drugs. 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  Tobacco Use   Smoking status: Former    Packs/day: 0.75    Years: 67.00    Total pack years: 50.25    Types: Cigarettes    Start date: 1954    Quit date: 07/13/2020    Years since quitting: 2.1    Passive exposure: Never   Smokeless tobacco: Never   Tobacco comments:    Recent Quit    Vaping Use   Vaping Use: Former  Substance and Sexual Activity   Alcohol use: No    Alcohol/week: 0.0 standard drinks of alcohol    Comment: quit drinking 2014   Drug use: No   Sexual activity: Not on file  Other Topics Concern   Not on file  Social History Narrative   Has living will   Wife is health care POA--alternate is son Buelah Manis)   Has daughter in [place]   Has DNR   No tube  feeds if cognitively unaware   Social Determinants of Health   Financial Resource Strain: Not on file  Food Insecurity: No Food Insecurity (08/13/2022)   Hunger Vital Sign    Worried About Running Out of Food in the Last Year: Never true    [place] in the Last Year: Never true  Transportation Needs: No Transportation Needs (08/13/2022)   PRAPARE - Hydrologist (Medical): No    Lack of Transportation (Non-Medical): No  Physical Activity: Not on file  Stress: Not on file  Social Connections: Not on file  Intimate Partner Violence: Not At  Risk (08/13/2022)   Humiliation, Afraid, Rape, and Kick questionnaire    Fear of Current or Ex-Partner: No    Emotionally Abused: No    Physically Abused: No    Sexually Abused: No   Functional Status Survey:    Allergies  Allergen Reactions   Flagyl [Metronidazole] Other (See Comments)    Other reaction(s): skin reaction    Pertinent  Health Maintenance Due  Topic Date Due   INFLUENZA VACCINE  Completed    Medications: Outpatient Encounter Medications as of 09/07/2022  Medication Sig   acetaminophen (TYLENOL) 325 MG tablet Take 650 mg by mouth every 4 (four) hours as needed.   albuterol (PROVENTIL) (2.5 MG/3ML) 0.083% nebulizer solution Take 3 mLs (2.5 mg total) by nebulization every 6 (six) hours as needed for wheezing or shortness of breath.   albuterol (VENTOLIN HFA) 108 (90 Base) MCG/ACT inhaler Inhale 2 puffs into the lungs 3 (three) times daily as needed for wheezing or shortness of breath.   ammonium lactate (AMLACTIN) 12 % cream Apply topically at bedtime.   apixaban (ELIQUIS) 5 MG TABS tablet Take 1 tablet (5 mg total) by mouth 2 (two) times daily.   Budeson-Glycopyrrol-Formoterol (BREZTRI AEROSPHERE) 160-9-4.8 MCG/ACT AERO Inhale 2 puffs into the lungs 2 (two) times daily.   clonazePAM (KLONOPIN) 0.5 MG tablet Take 1 tablet (0.5 mg total) by mouth daily as needed for anxiety.   diltiazem (CARDIZEM  CD) 120 MG 24 hr capsule Take 1 capsule (120 mg total) by mouth 2 (two) times daily.   furosemide (LASIX) 20 MG tablet Take one tablet (20 mg) by mouth daily with an addition tablet to equal (40 mg) every other day.   gabapentin (NEURONTIN) 100 MG capsule Take 100 mg by mouth 2 (two) times daily.   Infant Care Products [facility] EX) Apply to sacrum three times daily and as needed   ketoconazole (NIZORAL) 2 % cream Apply topically 2 (two) times daily.   Melatonin 5 MG CAPS Take 5 mg by mouth at bedtime.   nitroGLYCERIN (NITROSTAT) 0.4 MG SL tablet Place 1 tablet (0.4 mg total) under the tongue every 5 (five) minutes as needed for chest pain.   OXYGEN Inhale into the lungs. 2 liters upon exertion and 2 liters continuous at night.   pantoprazole (PROTONIX) 40 MG tablet Take 40 mg by mouth daily.   polyethylene glycol powder (GLYCOLAX/MIRALAX) 17 GM/SCOOP powder Take 255 g by mouth daily.   rosuvastatin (CRESTOR) 20 MG tablet Take 1 tablet (20 mg total) by mouth at bedtime.   tamsulosin (FLOMAX) 0.4 MG CAPS capsule Take 0.4 mg by mouth daily.   No facility-administered encounter medications on file as of 09/07/2022.    Review of Systems  All other systems reviewed and are negative.   Vitals:   09/07/22 1023  BP: (!) 129/54  Pulse: 76  Resp: (!) 22  Temp: 97.8 F (36.6 C)  SpO2: 97%  Weight: 159 lb (72.1 kg)  Height: 5' 10.5" (1.791 m)   Body mass index is 22.49 kg/m. Physical Exam Constitutional:      Comments: Sitting in wheelchair  Cardiovascular:     Rate and Rhythm: Normal rate. Rhythm irregular.  Pulmonary:     Comments: Decreased breath sounds in all lung fields, patient appears dyspneic without distress at this time. O2 2LNC.  Abdominal:     General: Abdomen is flat.  Musculoskeletal:     Comments: Legs wrapped with ace bandages  Skin:    General: Skin is warm.  Neurological:     Mental Status: He is alert.  Psychiatric:        Behavior: Behavior normal.      Comments: Patient is upset with the recommendation for hospice.      Labs reviewed: Basic Metabolic Panel: Recent Labs    08/12/22 1224 08/13/22 0303 08/16/22 0146 08/19/22 0637 08/20/22 0908 08/27/22 0000 08/30/22 1526  NA 140   < > 136 137  --  138 138  K 3.8   < > 4.8 5.1 4.6  --  5.1  CL 101   < > 99 99  --  100 98  CO2 31   < > 32 36*  --  34* 29  GLUCOSE 100*   < > 153* 119*  --   --  111*  BUN 25*   < > 47* 32*  --  31* 30*  CREATININE 1.21   < > 1.10 0.89  --  0.9 1.14  CALCIUM 8.7*   < > 8.4* 8.3*  --  8.4* 8.2*  MG 2.0  --  2.4  --   --   --   --   PHOS  --   --  4.1  --   --   --   --    < > = values in this interval not displayed.   Liver Function Tests: Recent Labs    04/03/22 0921 08/12/22 1224  AST 25 22  ALT 15 14  ALKPHOS 65 50  BILITOT 0.4 0.9  PROT 6.1 6.4*  ALBUMIN 4.2 3.6   No results for input(s): "LIPASE", "AMYLASE" in the last 8760 hours. No results for input(s): "AMMONIA" in the last 8760 hours. CBC: Recent Labs    05/30/22 1106 08/12/22 1224 08/13/22 0303 08/16/22 0146 08/19/22 0637 08/27/22 0000 08/30/22 1526  WBC 14.2* 13.6*   < > 10.8* 13.2* 14.0 13.4*  NEUTROABS 12.4* 10.6*  --   --   --  11,718.00  --   HGB 15.3 12.5*   < > 11.0* 11.4* 13.0* 11.7*  HCT 46.1 39.7   < > 33.4* 35.4* 38* 36.6*  MCV 93.3 98.8   < > 94.9 96.7  --  97.6  PLT 332.0 317   < > 260 230 259 156   < > = values in this interval not displayed.   Cardiac Enzymes: No results for input(s): "CKTOTAL", "CKMB", "CKMBINDEX", "TROPONINI" in the last 8760 hours. BNP: Invalid input(s): "POCBNP" CBG: No results for input(s): "GLUCAP" in the last 8760 hours.  Procedures and Imaging Studies During Stay: [facility] Chest Port 1 View  Result Date: 08/18/2022 CLINICAL DATA:  Shortness of breath. EXAM: PORTABLE CHEST 1 VIEW COMPARISON:  08/17/2022, 08/17/2022 and 06/18/2022 FINDINGS: Lungs are adequately inflated with mild emphysematous disease over the upper lungs. Minimal  stable prominence of the central bronchovascular markings which may be due to minimal vascular congestion versus viral bronchopneumonia. No lobar airspace consolidation or effusion. No pneumothorax. Cardiomediastinal silhouette and remainder of the exam is unchanged. IMPRESSION: 1. Minimal stable prominence of the central bronchovascular markings which may be due to minimal vascular congestion versus viral bronchopneumonia. 2. Emphysematous disease. Electronically Signed   By: Marin Olp M.D.   On: 08/18/2022 14:37   DG Chest Port 1 View  Result Date: 08/17/2022 CLINICAL DATA:  Shortness of breath starting this morning. History of COPD. EXAM: PORTABLE CHEST 1 VIEW COMPARISON:  08/12/2022 and CT chest 03/07/2022 FINDINGS: Tapering of the peripheral pulmonary vasculature favors emphysema. Heart size  within normal limits. Stable scarring in the right mid lung. No edema observed. Mild chronic interstitial accentuation. Airway thickening is present, suggesting bronchitis or reactive airways disease. Bilateral degenerative AC joint spurring. IMPRESSION: 1. Airway thickening is present, suggesting bronchitis or reactive airways disease. 2. Stable scarring in the right mid lung. 3. Emphysema. 4. Mild chronic interstitial accentuation. Electronically Signed   By: Van Clines M.D.   On: 08/17/2022 10:10   DG Chest Port 1 View  Result Date: 08/12/2022 CLINICAL DATA:  Shortness of breath. EXAM: PORTABLE CHEST 1 VIEW COMPARISON:  Two-view chest x-ray 07/18/2022 FINDINGS: Heart size is normal. Changes of COPD again noted. No superimposed airspace disease or edema is present. No significant effusions are present. Overlying defibrillator pads and leads are noted. IMPRESSION: 1. No acute cardiopulmonary disease. 2. COPD. Electronically Signed   By: San Morelle M.D.   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 Progress with therapy has been sufficient to return home. Discussed with patient concern that even though going home is his primary goal, I worry that he will continue to have events like what occurred yesterday. Recommended hospice to prevent future events as he is likely to have recovery to return to previous activity levels and will likely continue to decline. Discussed concern that the wounds on his buttocks are a sign that despite stable lung function, his body has continued to decline. Encouraged patient to follow up with PCP in the next week to continue conversation regarding goals of care. Continue Crestor. ContinueFlomax discontinued due to concern for orthostatic hypotension. Apixiban at 5 mg BID. Continue klonapin 0.5 mg prn nightly. Continue proscar. Lasix continued PRN for weight gain/swelling. Continue supplemental oxygen.    Patient is being discharged with the following home health services:    Patient is being discharged with the following durable medical equipment:    Patient has been advised to f/u with their PCP in 1-2 weeks to for a transitions of care visit.  Social services at their facility was responsible for arranging this appointment.  Pt was provided with adequate prescriptions of noncontrolled medications to reach the scheduled appointment .  For controlled substances, a limited supply was provided as appropriate for the individual patient.  If the pt normally receives these medications from a pain clinic or has a contract with another physician, these medications should be received from that clinic or physician only).    Future labs/tests needed:  BMP and CBC

## 2022-09-09 ENCOUNTER — Encounter: Payer: Self-pay | Admitting: Emergency Medicine

## 2022-09-09 DIAGNOSIS — L98429 Non-pressure chronic ulcer of back with unspecified severity: Secondary | ICD-10-CM | POA: Insufficient documentation

## 2022-09-10 ENCOUNTER — Ambulatory Visit
Admission: RE | Admit: 2022-09-10 | Discharge: 2022-09-10 | Disposition: A | Discharge status: Home / Self Care | Payer: Medicare Other | Source: Ambulatory Visit | Attending: Internal Medicine | Admitting: Internal Medicine

## 2022-09-10 DIAGNOSIS — R2689 Other abnormalities of gait and mobility: Secondary | ICD-10-CM | POA: Diagnosis not present

## 2022-09-10 DIAGNOSIS — J441 Chronic obstructive pulmonary disease with (acute) exacerbation: Secondary | ICD-10-CM | POA: Diagnosis not present

## 2022-09-10 DIAGNOSIS — C61 Malignant neoplasm of prostate: Secondary | ICD-10-CM | POA: Insufficient documentation

## 2022-09-10 DIAGNOSIS — J449 Chronic obstructive pulmonary disease, unspecified: Secondary | ICD-10-CM | POA: Insufficient documentation

## 2022-09-10 DIAGNOSIS — I2510 Atherosclerotic heart disease of native coronary artery without angina pectoris: Secondary | ICD-10-CM | POA: Diagnosis not present

## 2022-09-10 DIAGNOSIS — I4891 Unspecified atrial fibrillation: Secondary | ICD-10-CM | POA: Diagnosis not present

## 2022-09-10 DIAGNOSIS — R278 Other lack of coordination: Secondary | ICD-10-CM | POA: Diagnosis not present

## 2022-09-10 DIAGNOSIS — Z9981 Dependence on supplemental oxygen: Secondary | ICD-10-CM | POA: Diagnosis not present

## 2022-09-10 DIAGNOSIS — J431 Panlobular emphysema: Secondary | ICD-10-CM | POA: Diagnosis not present

## 2022-09-10 DIAGNOSIS — M8588 Other specified disorders of bone density and structure, other site: Secondary | ICD-10-CM | POA: Diagnosis not present

## 2022-09-10 DIAGNOSIS — Z7952 Long term (current) use of systemic steroids: Secondary | ICD-10-CM | POA: Diagnosis not present

## 2022-09-10 DIAGNOSIS — Z741 Need for assistance with personal care: Secondary | ICD-10-CM | POA: Diagnosis not present

## 2022-09-10 DIAGNOSIS — I4811 Longstanding persistent atrial fibrillation: Secondary | ICD-10-CM | POA: Diagnosis not present

## 2022-09-10 DIAGNOSIS — M6281 Muscle weakness (generalized): Secondary | ICD-10-CM | POA: Diagnosis not present

## 2022-09-10 DIAGNOSIS — Z13820 Encounter for screening for osteoporosis: Secondary | ICD-10-CM | POA: Diagnosis not present

## 2022-09-10 DIAGNOSIS — J9621 Acute and chronic respiratory failure with hypoxia: Secondary | ICD-10-CM | POA: Diagnosis not present

## 2022-09-10 NOTE — Telephone Encounter (Signed)
Okay to change his albuterol nebulizer to DuoNeb, 1 nebulizer treatment up to every 6 hours if needed for shortness of breath, chest tightness, wheezing.

## 2022-09-10 NOTE — Telephone Encounter (Signed)
Mychart message sent by pt: [name] [name].  P [facility] (supporting Collene Gobble, MD)22 hours ago (12:03 PM)    We have been using Albuterol in my nebulizer. In the hospital that was mixed with Ipratropium Bromide or Levalbuterol and the result seemed to be more effective. Wondering what you thought about prescribing one or the other for use at home along with the Albuterol.    Dr. Lamonte Sakai, please advise.

## 2022-09-11 ENCOUNTER — Telehealth: Payer: Self-pay | Admitting: Internal Medicine

## 2022-09-11 MED ORDER — APIXABAN 5 MG PO TABS: 5.0000 mg | ORAL_TABLET | Freq: Two times a day (BID) | ORAL | 5 refills | Status: DC

## 2022-09-11 NOTE — Addendum Note (Signed)
Addended by: Viviana Simpler I on: 09/11/2022 12:02 PM   Modules accepted: Orders

## 2022-09-11 NOTE — Telephone Encounter (Signed)
Prescription Request  09/11/2022  Is this a "Controlled Substance" medicine? No  LOV: 07/05/2022  What is the name of the medication or equipment? apixaban (ELIQUIS) 5 MG TABS tablet   Have you contacted your pharmacy to request a refill? No   Which pharmacy would you like this sent to?  [id number] [address]Phone: [phone]: [phone]   Patient notified that their request is being sent to the clinical staff for review and that they should receive a response within 2 business days.   Please advise at Mobile [phone](mobile)    Patient would also like for Dr. LSilvio Pateto look through his medications that Dr. BUnk Lightninghas prescribed him.

## 2022-09-12 DIAGNOSIS — I4891 Unspecified atrial fibrillation: Secondary | ICD-10-CM | POA: Diagnosis not present

## 2022-09-12 DIAGNOSIS — J441 Chronic obstructive pulmonary disease with (acute) exacerbation: Secondary | ICD-10-CM | POA: Diagnosis not present

## 2022-09-12 DIAGNOSIS — R2689 Other abnormalities of gait and mobility: Secondary | ICD-10-CM | POA: Diagnosis not present

## 2022-09-12 DIAGNOSIS — Z741 Need for assistance with personal care: Secondary | ICD-10-CM | POA: Diagnosis not present

## 2022-09-12 DIAGNOSIS — J9621 Acute and chronic respiratory failure with hypoxia: Secondary | ICD-10-CM | POA: Diagnosis not present

## 2022-09-12 DIAGNOSIS — M6281 Muscle weakness (generalized): Secondary | ICD-10-CM | POA: Diagnosis not present

## 2022-09-12 DIAGNOSIS — Z9981 Dependence on supplemental oxygen: Secondary | ICD-10-CM | POA: Diagnosis not present

## 2022-09-12 DIAGNOSIS — I2510 Atherosclerotic heart disease of native coronary artery without angina pectoris: Secondary | ICD-10-CM | POA: Diagnosis not present

## 2022-09-12 DIAGNOSIS — I4811 Longstanding persistent atrial fibrillation: Secondary | ICD-10-CM | POA: Diagnosis not present

## 2022-09-12 DIAGNOSIS — J431 Panlobular emphysema: Secondary | ICD-10-CM | POA: Diagnosis not present

## 2022-09-12 DIAGNOSIS — R278 Other lack of coordination: Secondary | ICD-10-CM | POA: Diagnosis not present

## 2022-09-13 ENCOUNTER — Telehealth: Payer: Self-pay | Admitting: Internal Medicine

## 2022-09-13 MED ORDER — FINASTERIDE 5 MG PO TABS: 5.0000 mg | ORAL_TABLET | Freq: Every day | ORAL | 3 refills | Status: DC

## 2022-09-13 NOTE — Telephone Encounter (Signed)
Spoke to pt. Advised him the [facility]urosem[facility] provider. Finasteride rx sent.

## 2022-09-13 NOTE — Telephone Encounter (Signed)
Prescription Request  09/13/2022  Is this a "Controlled Substance" medicine? No  LOV: 07/05/2022  What is the name of the medication or equipment? finasteride (PROSCAR) 5 MG tablet   furosemide (LASIX) 20 MG tablet   Have you contacted your pharmacy to request a refill? Yes   Which pharmacy would you like this sent to?  [id number] [address]Phone: [phone]: [phone]    Patient notified that their request is being sent to the clinical staff for review and that they should receive a response within 2 business days.   Please advise at [phone]

## 2022-09-14 DIAGNOSIS — J9621 Acute and chronic respiratory failure with hypoxia: Secondary | ICD-10-CM | POA: Diagnosis not present

## 2022-09-14 DIAGNOSIS — M6281 Muscle weakness (generalized): Secondary | ICD-10-CM | POA: Diagnosis not present

## 2022-09-14 DIAGNOSIS — Z741 Need for assistance with personal care: Secondary | ICD-10-CM | POA: Diagnosis not present

## 2022-09-14 DIAGNOSIS — I4811 Longstanding persistent atrial fibrillation: Secondary | ICD-10-CM | POA: Diagnosis not present

## 2022-09-14 DIAGNOSIS — R2689 Other abnormalities of gait and mobility: Secondary | ICD-10-CM | POA: Diagnosis not present

## 2022-09-14 DIAGNOSIS — I4891 Unspecified atrial fibrillation: Secondary | ICD-10-CM | POA: Diagnosis not present

## 2022-09-14 DIAGNOSIS — J431 Panlobular emphysema: Secondary | ICD-10-CM | POA: Diagnosis not present

## 2022-09-14 DIAGNOSIS — Z9981 Dependence on supplemental oxygen: Secondary | ICD-10-CM | POA: Diagnosis not present

## 2022-09-14 DIAGNOSIS — J441 Chronic obstructive pulmonary disease with (acute) exacerbation: Secondary | ICD-10-CM | POA: Diagnosis not present

## 2022-09-14 DIAGNOSIS — I2510 Atherosclerotic heart disease of native coronary artery without angina pectoris: Secondary | ICD-10-CM | POA: Diagnosis not present

## 2022-09-14 DIAGNOSIS — R278 Other lack of coordination: Secondary | ICD-10-CM | POA: Diagnosis not present

## 2022-09-14 MED ORDER — IPRATROPIUM-ALBUTEROL 0.5-2.5 (3) MG/3ML IN SOLN: 3.0000 mL | Freq: Four times a day (QID) | RESPIRATORY_TRACT | 1 refills | Status: DC | PRN

## 2022-09-14 NOTE — Anesthesia Preprocedure Evaluation (Signed)
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 Tachycardia noted. Left ventricular ejection fraction, by estimation, is 65 to 70%. The left ventricle has normal function. The left ventricle has no regional wall motion abnormalities. There is mild left ventricular hypertrophy. Left ventricular diastolic parameters are indeterminate. There is the interventricular septum is flattened in systole and diastole, consistent with right ventricular pressure and volume overload.   2. Right ventricular systolic function is normal. The right ventricular size is normal. Tricuspid regurgitation signal is inadequate for assessing PA pressure.   3. The mitral valve is normal in structure. No evidence of mitral valve regurgitation. No evidence of mitral stenosis.   4. The aortic valve is normal in structure. Aortic valve regurgitation is not visualized. No aortic stenosis is present.   5. The inferior vena cava is normal in size with greater than 50% respiratory variability, suggesting right atrial pressure of 3 mmHg.   Neuro/Psych Hx alcohol use disorder, last in 2014    GI/Hepatic ,GERD  ,,  Endo/Other  negative endocrine ROS    Renal/GU negative Renal ROS   BPH; prostate CA     Musculoskeletal  (+) Arthritis ,    Abdominal   Peds  Hematology negative hematology ROS (+)   Anesthesia Other Findings Cardiology note 08/30/22:  Persistent Afib/flutter Still in Afib/flutter with rate of 87bpm. BP is low today. He is taking Cardizem 129m BID. He has been taking Eliquis 547mBID since 1/16. Patient is fairy asymptomatic, however he does have lower leg edema on exam. I will decrease cardizem to 12043mID. I will set the patient up for cardioversion on 2/16 or after this.    Acute on chronic diastolic heart failure Patient has lower leg edema on exam. Suspected exacerbation in the setting of Afib. I will increase lasix to 37m8mery other day with 20mg11mthe other days. I will check a BNP and BMET today.    Elevated tropnin CAD s/p PCI 2019 HS troponin mildly elevated in the hospital, likely demand ischemia. No anginal symptoms reported. No plan for ischemic evaluation at this time. No ASA with Eliquis. Continue PTA Crestor.     Chronic hypoic respiratory failure He is on 3-3.5L at baseilne   HTN BP is soft, plan to decrease diltiazem as above.    HLD LDL 52 in 2024. Continue Crestor 20mg 20my.    Disposition: Follow up in 1 month(s) with MD/APP   Reproductive/Obstetrics                             Anesthesia Physical Anesthesia Plan  ASA: 3  Anesthesia Plan: General   Post-op Pain Management:    Induction: Intravenous  PONV Risk Score and Plan: 2 and Propofol infusion, TIVA and Treatment may vary due to  age or medical condition  Airway Management Planned: Natural Airway  Additional Equipment:   Intra-op Plan:   Post-operative Plan:   Informed Consent: I have reviewed the patients History and Physical, chart, labs and discussed the procedure including the risks, benefits and alternatives for the proposed anesthesia with the patient or authorized representative who has indicated his/her understanding and acceptance.    Patient has DNR.  Discussed DNR with patient and Suspend DNR.     Plan Discussed with: CRNA  Anesthesia Plan Comments: (LMA/GETA backup discussed.  Patient consented for risks of anesthesia including but not limited to:  - adverse reactions to medications - damage to eyes, teeth, lips or other oral mucosa - nerve damage due to positioning  - sore throat or hoarseness - damage to heart, brain, nerves, lungs, other parts of body or loss of life  Informed patient about role of CRNA in peri- and intra-operative care.  Patient voiced understanding.)        Anesthesia Quick Evaluation

## 2022-09-16 DIAGNOSIS — J449 Chronic obstructive pulmonary disease, unspecified: Secondary | ICD-10-CM | POA: Diagnosis not present

## 2022-09-16 DIAGNOSIS — J9611 Chronic respiratory failure with hypoxia: Secondary | ICD-10-CM | POA: Diagnosis not present

## 2022-09-16 MED ORDER — SODIUM CHLORIDE 0.9 % IV SOLN: INTRAVENOUS | Status: DC

## 2022-09-17 ENCOUNTER — Ambulatory Visit
Admission: RE | Admit: 2022-09-17 | Discharge: 2022-09-17 | Disposition: A | Discharge status: Home / Self Care | Payer: Medicare Other | Attending: Cardiovascular Disease | Admitting: Cardiovascular Disease

## 2022-09-17 ENCOUNTER — Encounter
Admission: RE | Disposition: A | Discharge status: Home / Self Care | Payer: Self-pay | Source: Home / Self Care | Attending: Cardiovascular Disease

## 2022-09-17 ENCOUNTER — Ambulatory Visit: Payer: Medicare Other | Admitting: Anesthesiology

## 2022-09-17 DIAGNOSIS — E782 Mixed hyperlipidemia: Secondary | ICD-10-CM | POA: Diagnosis not present

## 2022-09-17 DIAGNOSIS — I5033 Acute on chronic diastolic (congestive) heart failure: Secondary | ICD-10-CM | POA: Insufficient documentation

## 2022-09-17 DIAGNOSIS — I4819 Other persistent atrial fibrillation: Secondary | ICD-10-CM | POA: Diagnosis not present

## 2022-09-17 DIAGNOSIS — I4892 Unspecified atrial flutter: Secondary | ICD-10-CM | POA: Insufficient documentation

## 2022-09-17 DIAGNOSIS — Z7901 Long term (current) use of anticoagulants: Secondary | ICD-10-CM | POA: Insufficient documentation

## 2022-09-17 DIAGNOSIS — Z955 Presence of coronary angioplasty implant and graft: Secondary | ICD-10-CM | POA: Diagnosis not present

## 2022-09-17 DIAGNOSIS — J9611 Chronic respiratory failure with hypoxia: Secondary | ICD-10-CM | POA: Diagnosis not present

## 2022-09-17 DIAGNOSIS — Z9981 Dependence on supplemental oxygen: Secondary | ICD-10-CM | POA: Insufficient documentation

## 2022-09-17 DIAGNOSIS — Z79899 Other long term (current) drug therapy: Secondary | ICD-10-CM | POA: Diagnosis not present

## 2022-09-17 DIAGNOSIS — I4891 Unspecified atrial fibrillation: Secondary | ICD-10-CM | POA: Diagnosis not present

## 2022-09-17 DIAGNOSIS — Z87891 Personal history of nicotine dependence: Secondary | ICD-10-CM | POA: Insufficient documentation

## 2022-09-17 DIAGNOSIS — I509 Heart failure, unspecified: Secondary | ICD-10-CM | POA: Diagnosis not present

## 2022-09-17 DIAGNOSIS — I2510 Atherosclerotic heart disease of native coronary artery without angina pectoris: Secondary | ICD-10-CM | POA: Diagnosis not present

## 2022-09-17 DIAGNOSIS — I110 Hypertensive heart disease with heart failure: Secondary | ICD-10-CM | POA: Diagnosis not present

## 2022-09-17 DIAGNOSIS — K219 Gastro-esophageal reflux disease without esophagitis: Secondary | ICD-10-CM | POA: Insufficient documentation

## 2022-09-17 HISTORY — PX: CARDIOVERSION: SHX1299

## 2022-09-17 SURGERY — CARDIOVERSION
Anesthesia: General

## 2022-09-17 MED ORDER — PROPOFOL 10 MG/ML IV BOLUS
INTRAVENOUS | Status: DC | PRN
  Administered 2022-09-17: 30 mg via INTRAVENOUS

## 2022-09-17 NOTE — Anesthesia Procedure Notes (Signed)
Date/Time: 09/17/2022 7:26 AM  Performed by: Johnna Acosta, CRNAPre-anesthesia Checklist: Patient identified, Emergency Drugs available, Suction available, Patient being monitored and Timeout performed Patient Re-evaluated:Patient Re-evaluated prior to induction Oxygen Delivery Method: Nasal cannula Preoxygenation: Pre-oxygenation with 100% oxygen Induction Type: IV induction

## 2022-09-17 NOTE — CV Procedure (Signed)
Cardioversion note: A standard informed consent was obtained. Timeout was performed. The pads were placed in the anterior posterior fashion. The patient was given propofol by the anesthesia team.  Successful cardioversion was performed with a 100 J. The patient converted to sinus rhythm. Pre-and post EKGs were reviewed. The patient tolerated the procedure with no immediate complications.  Recommendations: Continue same medications and follow-up in 2-3 weeks.  

## 2022-09-17 NOTE — Anesthesia Postprocedure Evaluation (Signed)
Anesthesia Post Note  Patient: [name] [name].  Procedure(s) Performed: CARDIOVERSION  Patient location during evaluation: PACU Anesthesia Type: General Level of consciousness: awake and alert, oriented and patient cooperative Pain management: pain level controlled Vital Signs Assessment: post-procedure vital signs reviewed and stable Respiratory status: spontaneous breathing, nonlabored ventilation and respiratory function stable Cardiovascular status: blood pressure returned to baseline and stable Postop Assessment: adequate PO intake Anesthetic complications: no   No notable events documented.   Last Vitals:  Vitals:   09/17/22 0800 09/17/22 0815  BP: 114/61 (!) 113/58  Pulse: 84 84  Resp: 14 12  Temp:    SpO2: 100% 99%    Last Pain:  Vitals:   09/17/22 0815  TempSrc:   PainSc: 0-No pain                 Darrin Nipper

## 2022-09-17 NOTE — Transfer of Care (Signed)
Immediate Anesthesia Transfer of Care Note  Patient: [name] [name].  Procedure(s) Performed: CARDIOVERSION  Patient Location: cardio 3rd floor   Anesthesia Type:General  Level of Consciousness: awake and drowsy  Airway & Oxygen Therapy: Patient Spontanous Breathing and Patient connected to nasal cannula oxygen  Post-op Assessment: Report given to RN and Post -op Vital signs reviewed and stable  Post vital signs: Reviewed and stable  Last Vitals:  Vitals Value Taken Time  BP 122/55 09/17/22 0740  Temp    Pulse 89 09/17/22 0740  Resp 19 09/17/22 0740  SpO2 98 % 09/17/22 0740  Vitals shown include unvalidated device data.  Last Pain:  Vitals:   09/17/22 0653  TempSrc: Oral         Complications: No notable events documented.

## 2022-09-17 NOTE — Interval H&P Note (Signed)
History and Physical Interval Note:  09/17/2022 7:42 AM  [name] [name].  has presented today for surgery, with the diagnosis of Cardioversion   Afib OK Dr Danne Baxter  Start time 9:30a.  The various methods of treatment have been discussed with the patient and family. After consideration of risks, benefits and other options for treatment, the patient has consented to  Procedure(s): CARDIOVERSION (N/A) as a surgical intervention.  The patient's history has been reviewed, patient examined, no change in status, stable for surgery.  I have reviewed the patient's chart and labs.  Questions were answered to the patient's satisfaction.     Kathlyn Sacramento

## 2022-09-18 ENCOUNTER — Ambulatory Visit
Admission: RE | Admit: 2022-09-18 | Discharge: 2022-09-18 | Disposition: A | Discharge status: Home / Self Care | Payer: Medicare Other | Attending: Nurse Practitioner | Admitting: Nurse Practitioner

## 2022-09-18 ENCOUNTER — Ambulatory Visit: Payer: Medicare Other | Admitting: Nurse Practitioner

## 2022-09-18 ENCOUNTER — Ambulatory Visit
Admission: RE | Admit: 2022-09-18 | Discharge: 2022-09-18 | Disposition: A | Discharge status: Home / Self Care | Payer: Medicare Other | Source: Ambulatory Visit | Attending: Nurse Practitioner | Admitting: Nurse Practitioner

## 2022-09-18 ENCOUNTER — Encounter: Payer: Self-pay | Admitting: Nurse Practitioner

## 2022-09-18 VITALS — BP 128/58 | HR 58 | Temp 97.6°F

## 2022-09-18 DIAGNOSIS — M19072 Primary osteoarthritis, left ankle and foot: Secondary | ICD-10-CM | POA: Diagnosis not present

## 2022-09-18 DIAGNOSIS — I4811 Longstanding persistent atrial fibrillation: Secondary | ICD-10-CM

## 2022-09-18 DIAGNOSIS — L03116 Cellulitis of left lower limb: Secondary | ICD-10-CM | POA: Diagnosis not present

## 2022-09-18 DIAGNOSIS — M79672 Pain in left foot: Secondary | ICD-10-CM | POA: Diagnosis not present

## 2022-09-18 MED ORDER — DOXYCYCLINE HYCLATE 100 MG PO TABS: 100.0000 mg | ORAL_TABLET | Freq: Two times a day (BID) | ORAL | 0 refills | Status: DC

## 2022-09-18 NOTE — Progress Notes (Signed)
Careteam: Patient Care Team: [name], MD as PCP - General (Internal Medicine) Antony Madura, PA-C as PCP - Cardiology (Cardiology)  Advanced Directive information Does Patient Have a Medical Advance Directive?: Yes, Type of Advance Directive: Out of facility DNR (pink MOST or yellow form), Pre-existing out of facility DNR order (yellow form or pink MOST form): Pink MOST form placed in chart (order not valid for inpatient use), Does patient want to make changes to medical advance directive?: No - Patient declined  Allergies  Allergen Reactions   Flagyl [Metronidazole] Other (See Comments)    Other reaction(s): skin reaction    Chief Complaint  Patient presents with   Acute Visit    Issues with left foot (toes), pain x 1 week. Patient with breathing issues since cardioversion, yesterday.      HPI: Patient is a 82 y.o. male seen in today at the [facility] for due to redness to left foot. Pt of Dr Silvio Pate and has follow up in 2 days after hospitalization and SNF.  He has been getting dressing changes and wraps daily from the IL nurse due to LE edema with blisters at the base of his toes.  Yesterday great toe and 2nd toe were mildly red but now with all toes and foot. Tbuoes has been painful for the last 6 weeks but significantly  worse the last week. Pain level 7-8/10  Takes tylenol for the pain which helps.  No fevers.    Review of Systems:  Review of Systems  Constitutional:  Positive for malaise/fatigue. Negative for chills and fever.  Respiratory:  Positive for cough, sputum production and shortness of breath.   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   Chronic obstructive pulmonary disease with (acute) exacerbation ([place]) 12/07/2020   Chronic respiratory failure with hypoxia ([place]) 08/10/2019   Chronic sinusitis 12/07/2020   Cigarette nicotine dependence, uncomplicated [id number]   Coagulation disorder ([place]) 01/13/2019   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   Contracture of palmar fascia 12/07/2020   COPD (chronic obstructive pulmonary disease) ([place])    Corn of toe 12/07/2020   Corns and callosities 04/09/2018   Coronary artery disease    2019 with stents   Coronary atherosclerosis 05/07/2018   Degenerative disc disease, 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   Degenerative disc disease, 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   Diarrhea 04/09/2018   Double vision with both eyes open 12/07/2020   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   Dysphagia 08/14/2018   Dyspnea    ED (erectile dysfunction)    ED (erectile dysfunction) of organic origin 12/07/2020   Elevated prostate specific antigen (PSA) 04/08/2018   Elevated PSA    Encounter for screening for other disorder 12/07/2020   Enlarged prostate without lower urinary tract symptoms (luts) 04/09/2018   Enthesopathy 12/07/2020  Essential (primary) hypertension 04/08/2018   Ex-smoker 04/09/2018   Exertional dyspnea 04/02/2018   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   Flatulence 04/08/2018   Gastro-esophageal reflux disease without esophagitis 04/08/2018   GERD (gastroesophageal reflux disease)    History of acute otitis externa 08/14/2018   Hyperlipidemia    Hypertension    Hypoxemia 12/07/2020   Kidney cysts    Left knee pain 12/07/2020   Low back pain 04/08/2018   Male erectile  dysfunction 04/09/2018   Mixed hyperlipidemia 04/08/2018   Nocturia more than twice per night 04/09/2018   Osteoarthritis of first carpometacarpal joint 04/08/2018   Osteoarthritis of knee 12/07/2020   Other long term (current) drug therapy 12/07/2020   Pain due to onychomycosis of toenail of left foot 07/20/2019   Pain in joint 04/09/2018   Pain in knee 04/09/2018   Pain in unspecified knee 12/07/2020   Pain of finger 04/09/2018   Palpitations    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[place]) 12/07/2020   Personal history of colonic polyps 12/07/2020   Pneumonia    Pneumothorax 04/12/2020   Polypharmacy 04/09/2018   Porokeratosis 01/13/2019   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[place])    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   Pulmonary nodules 06/08/2016   Pure hypercholesterolemia 12/07/2020   Renal cyst 12/07/2020   Sleep disorder 04/08/2018   Status post bronchoscopy 04/12/2020   Tear of rotator cuff 04/09/2018   Tobacco user [id number]   Uncomplicated alcohol dependence ([place]) 04/09/2018   Unspecified rotator cuff tear or rupture of unspecified shoulder, not specified as traumatic 12/07/2020   Unspecified tear of unspecified meniscus, current injury, unspecified knee, initial encounter 12/07/2020   Visual disturbance 12/07/2020   Vitamin D deficiency 04/08/2018   Past Surgical History:  Procedure Laterality Date   BRONCHIAL BIOPSY  10/13/2019   Procedure: BRONCHIAL BIOPSIES;  Surgeon: Collene Gobble, MD;  Location: [place];  Service: Pulmonary;;   BRONCHIAL BIOPSY  04/12/2020   Procedure: BRONCHIAL BIOPSIES;  Surgeon: Collene Gobble, MD;  Location: [place];  Service: Pulmonary;;   BRONCHIAL BRUSHINGS  10/13/2019   Procedure: BRONCHIAL BRUSHINGS;  Surgeon: Collene Gobble, MD;  Location: [facility] ENDOSCOPY;  Service: Pulmonary;;   BRONCHIAL BRUSHINGS  04/12/2020   Procedure: BRONCHIAL BRUSHINGS;  Surgeon: Collene Gobble, MD;  Location[facility] ENDOSCOPY;  Service: Pulmonary;;   BRONCHIAL NEEDLE ASPIRATION  BIOPSY  10/13/2019   Procedure: BRONCHIAL NEEDLE ASPIRATION BIOPSIES;  Surgeon: Collene Gobble, MD;  Location: [facility] ENDOSCOPY;  Service: Pulmonary;;   BRONCHIAL NEEDLE ASPIRATION BIOPSY  04/12/2020   Procedure: BRONCHIAL NEEDLE ASPIRATION BIOPSIES;  Surgeon: Collene Gobble, MD;  Location: [facility] ENDOSCOPY;  Service: Pulmonary;;   BRONCHIAL WASHINGS  10/13/2019   Procedure: BRONCHIAL WASHINGS;  Surgeon: Collene Gobble, MD;  Location: [facility] ENDOSCOPY;  Service: Pulmonary;;   BRONCHIAL WASHINGS  04/12/2020   Procedure: BRONCHIAL WASHINGS;  Surgeon: Collene Gobble, MD;  Location: [place] ENDOSCOPY;  Service: Pulmonary;;   CARDIAC CATHETERIZATION  2002   50% RCA   CATARACT EXTRACTION, BILATERAL     CORONARY STENT INTERVENTION N/A 04/15/2018   Procedure: CORONARY STENT INTERVENTION;  Surgeon: Jettie Booze, MD;  Location: [place] CV LAB;  Service: Cardiovascular;  Laterality: N/A;  om1   EYE SURGERY     KNEE ARTHROSCOPY     LEFT HEART CATH AND CORONARY ANGIOGRAPHY N/A 04/15/2018   Procedure: LEFT HEART CATH AND CORONARY ANGIOGRAPHY;  Surgeon: Jettie Booze, MD;  Location: [place] CV LAB;  Service: Cardiovascular;  Laterality: N/A;  MULTIPLE TOOTH EXTRACTIONS     TONSILLECTOMY     VIDEO BRONCHOSCOPY  04/12/2020   VIDEO BRONCHOSCOPY WITH ENDOBRONCHIAL NAVIGATION N/A 07/26/2016   Procedure: VIDEO BRONCHOSCOPY WITH ENDOBRONCHIAL NAVIGATION;  Surgeon: Collene Gobble, MD;  Location: [place];  Service: Thoracic;  Laterality: N/A;   VIDEO BRONCHOSCOPY WITH ENDOBRONCHIAL NAVIGATION N/A 10/13/2019   Procedure: [facility] AND VIDEO BRONCHOSCOPY WITH ENDOBRONCHIAL NAVIGATION;  Surgeon: Collene Gobble, MD;  Location: [place] ENDOSCOPY;  Service: Pulmonary;  Laterality: N/A;   VIDEO BRONCHOSCOPY WITH ENDOBRONCHIAL NAVIGATION N/A 04/12/2020   Procedure: VIDEO BRONCHOSCOPY WITH ENDOBRONCHIAL NAVIGATION;  Surgeon: Collene Gobble, MD;  Location: [place] ENDOSCOPY;  Service: Pulmonary;  Laterality: N/A;   Social History:    reports that he quit smoking about 2 years ago. His smoking use included cigarettes. He started smoking about 70 years ago. He has a 50.25 pack-year smoking history. He has never been exposed to tobacco smoke. He has never used smokeless tobacco. He reports that he does not drink alcohol and does not use drugs.  Family History  Problem Relation Age of Onset   Hypertension Mother    Alzheimer's disease Mother     Medications: Patient's Medications  New Prescriptions   No medications on file  Previous Medications   ACETAMINOPHEN (TYLENOL) 325 MG TABLET    Take 650 mg by mouth every 4 (four) hours as needed.   ALBUTEROL (PROVENTIL) (2.5 MG/3ML) 0.083% NEBULIZER SOLUTION    Take 2.5 mg by nebulization 2 (two) times daily.   ALBUTEROL (VENTOLIN HFA) 108 (90 BASE) MCG/ACT INHALER    Inhale 2 puffs into the lungs 3 (three) times daily as needed for wheezing or shortness of breath.   AMMONIUM LACTATE (AMLACTIN) 12 % CREAM    Apply topically at bedtime.   APIXABAN (ELIQUIS) 5 MG TABS TABLET    Take 1 tablet (5 mg total) by mouth 2 (two) times daily.   BUDESON-GLYCOPYRROL-FORMOTEROL (BREZTRI AEROSPHERE) 160-9-4.8 MCG/ACT AERO    Inhale 2 puffs into the lungs 2 (two) times daily.   CLONAZEPAM (KLONOPIN) 0.5 MG TABLET    Take 1 tablet (0.5 mg total) by mouth daily as needed for anxiety.   DILTIAZEM (CARDIZEM CD) 120 MG 24 HR CAPSULE    Take 1 capsule (120 mg total) by mouth 2 (two) times daily.   FINASTERIDE (PROSCAR) 5 MG TABLET    Take 1 tablet (5 mg total) by mouth daily.   FUROSEMIDE (LASIX) 20 MG TABLET    Take one tablet (20 mg) by mouth daily with an addition tablet to equal (40 mg) every other day.   GABAPENTIN (NEURONTIN) 100 MG CAPSULE    Take 100 mg by mouth 2 (two) times daily.   INFANT CARE PRODUCTS (DERMACLOUD EX)    Apply to sacrum daily and as needed   IPRATROPIUM-ALBUTEROL (DUONEB) 0.5-2.5 (3) MG/3ML SOLN    Take 3 mLs by nebulization every 6 (six) hours as needed.   KETOCONAZOLE  (NIZORAL) 2 % CREAM    Apply topically 2 (two) times daily.   MELATONIN 5 MG CAPS    Take 5 mg by mouth at bedtime.   NITROGLYCERIN (NITROSTAT) 0.4 MG SL TABLET    Place 1 tablet (0.4 mg total) under the tongue every 5 (five) minutes as needed for chest pain.   OXYGEN    Inhale into the lungs. 2 liters upon exertion and 3 liters continuous at night.   PANTOPRAZOLE (PROTONIX) 40 MG TABLET    Take 40 mg by mouth  daily.   POLYETHYLENE GLYCOL (MIRALAX / GLYCOLAX) 17 G PACKET    Take 17 g by mouth as needed.   ROSUVASTATIN (CRESTOR) 20 MG TABLET    Take 1 tablet (20 mg total) by mouth at bedtime.  Modified Medications   No medications on file  Discontinued Medications   ALBUTEROL (PROVENTIL) (2.5 MG/3ML) 0.083% NEBULIZER SOLUTION    Take 3 mLs (2.5 mg total) by nebulization every 6 (six) hours as needed for wheezing or shortness of breath.   POLYETHYLENE GLYCOL POWDER (GLYCOLAX/MIRALAX) 17 GM/SCOOP POWDER    Take 255 g by mouth daily.    Physical Exam:  There were no vitals filed for this visit. There is no height or weight on file to calculate BMI. Wt Readings from Last 3 Encounters:  09/17/22 158 lb 11.7 oz (72 kg)  09/07/22 159 lb (72.1 kg)  09/06/22 159 lb (72.1 kg)    Physical Exam Constitutional:      General: He is not in acute distress.    Appearance: He is well-developed. He is not diaphoretic.  HENT:     Head: Normocephalic and atraumatic.     Right Ear: External ear normal.     Left Ear: External ear normal.     Mouth/Throat:     Pharynx: No oropharyngeal exudate.  Eyes:     Conjunctiva/sclera: Conjunctivae normal.     Pupils: Pupils are equal, round, and reactive to light.  Cardiovascular:     Rate and Rhythm: Normal rate and regular rhythm.     Heart sounds: Normal heart sounds.  Pulmonary:     Comments: Diminished throughout  Abdominal:     General: Bowel sounds are normal.     Palpations: Abdomen is soft.  Musculoskeletal:        General: No tenderness.      Cervical back: Normal range of motion and neck supple.     Right lower leg: No edema.     Left lower leg: No edema.  Skin:    General: Skin is warm and dry.  Neurological:     Mental Status: He is alert and oriented to person, place, and time.     Labs reviewed: Basic Metabolic Panel: Recent Labs    05/30/22 1106 08/12/22 1224 08/12/22 1438 08/13/22 0303 08/16/22 0146 08/19/22 0637 08/20/22 0908 08/27/22 0000 08/30/22 1526  NA 142 140  --    < > 136 137  --  138 138  K 5.1 3.8  --    < > 4.8 5.1 4.6  --  5.1  CL 102 101  --    < > 99 99  --  100 98  CO2 33* 31  --    < > 32 36*  --  34* 29  GLUCOSE 87 100*  --    < > 153* 119*  --   --  111*  BUN 38* 25*  --    < > 47* 32*  --  31* 30*  CREATININE 1.20 1.21  --    < > 1.10 0.89  --  0.9 1.14  CALCIUM 9.1 8.7*  --    < > 8.4* 8.3*  --  8.4* 8.2*  MG  --  2.0  --   --  2.4  --   --   --   --   PHOS  --   --   --   --  4.1  --   --   --   --  TSH 0.48  --  0.700  --   --   --   --   --   --    < > = values in this interval not displayed.   Liver Function Tests: Recent Labs    04/03/22 0921 08/12/22 1224  AST 25 22  ALT 15 14  ALKPHOS 65 50  BILITOT 0.4 0.9  PROT 6.1 6.4*  ALBUMIN 4.2 3.6   No results for input(s): "LIPASE", "AMYLASE" in the last 8760 hours. No results for input(s): "AMMONIA" in the last 8760 hours. CBC: Recent Labs    05/30/22 1106 08/12/22 1224 08/13/22 0303 08/16/22 0146 08/19/22 0637 08/27/22 0000 08/30/22 1526  WBC 14.2* 13.6*   < > 10.8* 13.2* 14.0 13.4*  NEUTROABS 12.4* 10.6*  --   --   --  11,718.00  --   HGB 15.3 12.5*   < > 11.0* 11.4* 13.0* 11.7*  HCT 46.1 39.7   < > 33.4* 35.4* 38* 36.6*  MCV 93.3 98.8   < > 94.9 96.7  --  97.6  PLT 332.0 317   < > 260 230 259 156   < > = values in this interval not displayed.   Lipid Panel: Recent Labs    04/03/22 0921 08/13/22 0303  CHOL 153 141  HDL 96 80  LDLCALC 47 52  TRIG 42 46  CHOLHDL 1.6 1.8   TSH: Recent Labs     05/30/22 1106 08/12/22 1438  TSH 0.48 0.700   A1C: Lab Results  Component Value Date   HGBA1C 5.0 08/13/2022     Assessment/Plan 1. Left foot pain Pain to toes and foot, now with cellulitis, will get xray to rule out osteomyelitis.  - doxycycline (VIBRA-TABS) 100 MG tablet; Take 1 tablet (100 mg total) by mouth 2 (two) times daily.  Dispense: 14 tablet; Refill: 0 - DG Foot Complete Left; Future  2. Cellulitis of left foot -progressively worsening redness to left toes and foot. IL nurse marked area and if redness worsens while on antibiotic will go to the ED. IL nurse will continue to follow up with pt as well.  - doxycycline (VIBRA-TABS) 100 MG tablet; Take 1 tablet (100 mg total) by mouth 2 (two) times daily.  Dispense: 14 tablet; Refill: 0 to take with food - DG Foot Complete Left; Future  3. Longstanding persistent atrial fibrillation ([place]) -had cardioversion yesterday, continues in SR  To follow up with PCP in 2 days.  Carlos American. [place], [facility] [phone]

## 2022-09-19 DIAGNOSIS — R278 Other lack of coordination: Secondary | ICD-10-CM | POA: Diagnosis not present

## 2022-09-19 DIAGNOSIS — I2510 Atherosclerotic heart disease of native coronary artery without angina pectoris: Secondary | ICD-10-CM | POA: Diagnosis not present

## 2022-09-19 DIAGNOSIS — I4811 Longstanding persistent atrial fibrillation: Secondary | ICD-10-CM | POA: Diagnosis not present

## 2022-09-19 DIAGNOSIS — Z9981 Dependence on supplemental oxygen: Secondary | ICD-10-CM | POA: Diagnosis not present

## 2022-09-19 DIAGNOSIS — Z741 Need for assistance with personal care: Secondary | ICD-10-CM | POA: Diagnosis not present

## 2022-09-19 DIAGNOSIS — I4891 Unspecified atrial fibrillation: Secondary | ICD-10-CM | POA: Diagnosis not present

## 2022-09-19 DIAGNOSIS — M6281 Muscle weakness (generalized): Secondary | ICD-10-CM | POA: Diagnosis not present

## 2022-09-19 DIAGNOSIS — J9621 Acute and chronic respiratory failure with hypoxia: Secondary | ICD-10-CM | POA: Diagnosis not present

## 2022-09-19 DIAGNOSIS — R2689 Other abnormalities of gait and mobility: Secondary | ICD-10-CM | POA: Diagnosis not present

## 2022-09-19 DIAGNOSIS — J431 Panlobular emphysema: Secondary | ICD-10-CM | POA: Diagnosis not present

## 2022-09-19 DIAGNOSIS — J441 Chronic obstructive pulmonary disease with (acute) exacerbation: Secondary | ICD-10-CM | POA: Diagnosis not present

## 2022-09-20 ENCOUNTER — Ambulatory Visit (INDEPENDENT_AMBULATORY_CARE_PROVIDER_SITE_OTHER): Payer: Medicare Other | Admitting: Internal Medicine

## 2022-09-20 ENCOUNTER — Encounter: Payer: Self-pay | Admitting: Internal Medicine

## 2022-09-20 VITALS — BP 104/60 | HR 80 | Temp 97.7°F | Ht 70.5 in | Wt 158.0 lb

## 2022-09-20 DIAGNOSIS — L03116 Cellulitis of left lower limb: Secondary | ICD-10-CM | POA: Diagnosis not present

## 2022-09-20 DIAGNOSIS — L97521 Non-pressure chronic ulcer of other part of left foot limited to breakdown of skin: Secondary | ICD-10-CM

## 2022-09-20 DIAGNOSIS — J449 Chronic obstructive pulmonary disease, unspecified: Secondary | ICD-10-CM | POA: Diagnosis not present

## 2022-09-20 DIAGNOSIS — L97509 Non-pressure chronic ulcer of other part of unspecified foot with unspecified severity: Secondary | ICD-10-CM | POA: Insufficient documentation

## 2022-09-20 DIAGNOSIS — I4819 Other persistent atrial fibrillation: Secondary | ICD-10-CM | POA: Diagnosis not present

## 2022-09-20 MED ORDER — SILVER SULFADIAZINE 1 % EX CREA: 1.0000 | TOPICAL_CREAM | Freq: Every day | CUTANEOUS | 0 refills | Status: DC

## 2022-09-20 MED ORDER — LEVOFLOXACIN 500 MG PO TABS: 500.0000 mg | ORAL_TABLET | Freq: Every day | ORAL | 0 refills | Status: DC

## 2022-09-20 MED ORDER — ACETAMINOPHEN-CODEINE 300-15 MG PO TABS: 1.0000 | ORAL_TABLET | ORAL | 0 refills | Status: DC | PRN

## 2022-09-20 MED ORDER — CEFTRIAXONE SODIUM 1 G IJ SOLR
1.0000 g | Freq: Once | INTRAMUSCULAR | Status: AC
  Administered 2022-09-20: 1 g via INTRAMUSCULAR

## 2022-09-20 NOTE — Assessment & Plan Note (Signed)
No worse but not better with doxy 2 days Will give rocephin 1gm today IM Add levaquin 533m daily To ER if worsens----recheck 2/26 otherwise

## 2022-09-20 NOTE — Assessment & Plan Note (Signed)
Remains regular after cardioversion Diltiazem 120 bid, apixaban 5 bid

## 2022-09-20 NOTE — Progress Notes (Signed)
Subjective:    Patient ID: [name] Kemps., male    DOB: 1940-08-09, 82 y.o.   MRN: [id number]  HPI Here for follow up after hospitalization and rehab  Hospitalized with RSV infection, exacerbation of COPD New onset of atrial fibrillation with rapid rate Got IV meds and rate did slow Eventually improved enough to go to rehab Home 2/9  Went to hospital for cardioversion on 2/19 This was successful He has not had any palpitations through this No chest pain  Breathing is improved since the cardioversion Now mostly stuck in wheelchair due to current foot infection Gets easy DOE even walking ---so uses transport chair much of the time at home (can occasionally walk short distances with wheelchair) Showering with aide and shower chair Dresses himself--but "a struggle" Able to get to the bathroom--but uses urinal at nightl  Started having trouble with left foot--reports it from months ago Bad in hospital and rehab but wasn't addressed Saw NP in clinic 2 days ago--started doxy  X-ray showed severe arthritis but no infection in bone Still having considerable pain when he stands up on it  Labs from rehab 1/29 reviewed  Current Outpatient Medications on File Prior to Visit  Medication Sig Dispense Refill   acetaminophen (TYLENOL) 325 MG tablet Take 650 mg by mouth every 4 (four) hours as needed.     albuterol (PROVENTIL) (2.5 MG/3ML) 0.083% nebulizer solution Take 2.5 mg by nebulization 2 (two) times daily.     albuterol (VENTOLIN HFA) 108 (90 Base) MCG/ACT inhaler Inhale 2 puffs into the lungs 3 (three) times daily as needed for wheezing or shortness of breath.     ammonium lactate (AMLACTIN) 12 % cream Apply topically at bedtime.     apixaban (ELIQUIS) 5 MG TABS tablet Take 1 tablet (5 mg total) by mouth 2 (two) times daily. 60 tablet 5   Budeson-Glycopyrrol-Formoterol (BREZTRI AEROSPHERE) 160-9-4.8 MCG/ACT AERO Inhale 2 puffs into the lungs 2 (two) times daily. 10.7 g 6    diltiazem (CARDIZEM CD) 120 MG 24 hr capsule Take 1 capsule (120 mg total) by mouth 2 (two) times daily. 180 capsule 3   doxycycline (VIBRA-TABS) 100 MG tablet Take 1 tablet (100 mg total) by mouth 2 (two) times daily. 14 tablet 0   finasteride (PROSCAR) 5 MG tablet Take 1 tablet (5 mg total) by mouth daily. 90 tablet 3   furosemide (LASIX) 20 MG tablet Take one tablet (20 mg) by mouth daily with an addition tablet to equal (40 mg) every other day. 90 tablet 3   gabapentin (NEURONTIN) 100 MG capsule Take 100 mg by mouth 2 (two) times daily.     Infant Care Products [facility] EX) Apply to sacrum daily and as needed     ipratropium-albuterol (DUONEB) 0.5-2.5 (3) MG/3ML SOLN Take 3 mLs by nebulization every 6 (six) hours as needed. 360 mL 1   ketoconazole (NIZORAL) 2 % cream Apply topically 2 (two) times daily.     Melatonin 5 MG CAPS Take 5 mg by mouth at bedtime.     nitroGLYCERIN (NITROSTAT) 0.4 MG SL tablet Place 1 tablet (0.4 mg total) under the tongue every 5 (five) minutes as needed for chest pain. 25 tablet 4   OXYGEN Inhale into the lungs. 2 liters upon exertion and 3 liters continuous at night.     pantoprazole (PROTONIX) 40 MG tablet Take 40 mg by mouth daily.     polyethylene glycol (MIRALAX / GLYCOLAX) 17 g packet Take 17 g by  mouth as needed.     rosuvastatin (CRESTOR) 20 MG tablet Take 1 tablet (20 mg total) by mouth at bedtime. 90 tablet 2   clonazePAM (KLONOPIN) 0.5 MG tablet Take 1 tablet (0.5 mg total) by mouth daily as needed for anxiety. 30 tablet 0   No current facility-administered medications on file prior to visit.    Allergies  Allergen Reactions   Flagyl [Metronidazole] Other (See Comments)    Other reaction(s): skin reaction    Past Medical History:  Diagnosis Date   Abdominal discomfort 12/07/2020   Acquired trigger finger of right middle finger 02/04/2020   Acute prostatitis 04/08/2018   Alcohol abuse    Alcohol dependence ([place]) 12/07/2020   Anal or rectal  pain 04/08/2018   Annual physical exam 12/07/2020   Anorectal disorder 12/07/2020   Arthritis    Atherosclerotic heart disease of native coronary artery without angina pectoris 04/08/2018   Benign prostatic hyperplasia with lower urinary tract symptoms 12/07/2020   BPH with elevated PSA    Cancer ([place])    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   Chronic obstructive pulmonary disease with (acute) exacerbation ([place]) 12/07/2020   Chronic respiratory failure with hypoxia ([place]) 08/10/2019   Chronic sinusitis 12/07/2020   Cigarette nicotine dependence, uncomplicated [id number]   Coagulation disorder ([place]) 01/13/2019   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   Contracture of palmar fascia 12/07/2020   COPD (chronic obstructive pulmonary disease) ([place])    Corn of toe 12/07/2020   Corns and callosities 04/09/2018   Coronary artery disease    2019 with stents   Coronary atherosclerosis 05/07/2018   Degenerative disc disease, 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   Degenerative disc disease, 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   Diarrhea 04/09/2018   Double vision with both eyes open 12/07/2020   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   Dysphagia 08/14/2018   Dyspnea    ED (erectile dysfunction)    ED (erectile dysfunction) of organic origin 12/07/2020   Elevated prostate specific antigen (PSA) 04/08/2018   Elevated PSA    Encounter for screening for other disorder 12/07/2020   Enlarged prostate without lower urinary tract symptoms (luts) 04/09/2018   Enthesopathy 12/07/2020   Essential (primary) hypertension 04/08/2018   Ex-smoker 04/09/2018   Exertional dyspnea 04/02/2018   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   Flatulence 04/08/2018   Gastro-esophageal reflux disease without esophagitis 04/08/2018   GERD (gastroesophageal reflux disease)    History of acute otitis externa 08/14/2018   Hyperlipidemia    Hypertension    Hypoxemia 12/07/2020   Kidney cysts    Left knee  pain 12/07/2020   Low back pain 04/08/2018   Male erectile dysfunction 04/09/2018   Mixed hyperlipidemia 04/08/2018   Nocturia more than twice per night 04/09/2018   Osteoarthritis of first carpometacarpal joint 04/08/2018   Osteoarthritis of knee 12/07/2020   Other long term (current) drug therapy 12/07/2020   Pain due to onychomycosis of toenail of left foot 07/20/2019   Pain in joint 04/09/2018   Pain in knee 04/09/2018   Pain in unspecified knee 12/07/2020   Pain of finger 04/09/2018   Palpitations    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[place]) 12/07/2020   Personal history of colonic polyps 12/07/2020   Pneumonia    Pneumothorax 04/12/2020   Polypharmacy 04/09/2018   Porokeratosis 01/13/2019   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[place])    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   Pulmonary nodules 06/08/2016   Pure hypercholesterolemia 12/07/2020   Renal cyst 12/07/2020   Sleep  disorder 04/08/2018   Status post bronchoscopy 04/12/2020   Tear of rotator cuff 04/09/2018   Tobacco user [id number]   Uncomplicated alcohol dependence ([place]) 04/09/2018   Unspecified rotator cuff tear or rupture of unspecified shoulder, not specified as traumatic 12/07/2020   Unspecified tear of unspecified meniscus, current injury, unspecified knee, initial encounter 12/07/2020   Visual disturbance 12/07/2020   Vitamin D deficiency 04/08/2018    Past Surgical History:  Procedure Laterality Date   BRONCHIAL BIOPSY  10/13/2019   Procedure: BRONCHIAL BIOPSIES;  Surgeon: Collene Gobble, MD;  Location: [place];  Service: Pulmonary;;   BRONCHIAL BIOPSY  04/12/2020   Procedure: BRONCHIAL BIOPSIES;  Surgeon: Collene Gobble, MD;  Location: [facility] ENDOSCOPY;  Service: Pulmonary;;   BRONCHIAL BRUSHINGS  10/13/2019   Procedure: BRONCHIAL BRUSHINGS;  Surgeon: Collene Gobble, MD;  Location: [facility] ENDOSCOPY;  Service: Pulmonary;;   BRONCHIAL BRUSHINGS  04/12/2020   Procedure: BRONCHIAL BRUSHINGS;  Surgeon: Collene Gobble, MD;  Location: [facility]  ENDOSCOPY;  Service: Pulmonary;;   BRONCHIAL NEEDLE ASPIRATION BIOPSY  10/13/2019   Procedure: BRONCHIAL NEEDLE ASPIRATION BIOPSIES;  Surgeon: Collene Gobble, MD;  Location: [facility] ENDOSCOPY;  Service: Pulmonary;;   BRONCHIAL NEEDLE ASPIRATION BIOPSY  04/12/2020   Procedure: BRONCHIAL NEEDLE ASPIRATION BIOPSIES;  Surgeon: Collene Gobble, MD;  Location: [place];  Service: Pulmonary;;   BRONCHIAL WASHINGS  10/13/2019   Procedure: BRONCHIAL WASHINGS;  Surgeon: Collene Gobble, MD;  Location: [place];  Service: Pulmonary;;   BRONCHIAL WASHINGS  04/12/2020   Procedure: BRONCHIAL WASHINGS;  Surgeon: Collene Gobble, MD;  Location: [place];  Service: Pulmonary;;   CARDIAC CATHETERIZATION  2002   50% RCA   CARDIOVERSION N/A 09/17/2022   Procedure: CARDIOVERSION;  Surgeon: [place], MD;  Location: [facility] ORS;  Service: Cardiovascular;  Laterality: N/A;   CATARACT EXTRACTION, BILATERAL     CORONARY STENT INTERVENTION N/A 04/15/2018   Procedure: CORONARY STENT INTERVENTION;  Surgeon: Jettie Booze, MD;  Location: [place] CV LAB;  Service: Cardiovascular;  Laterality: N/A;  om1   EYE SURGERY     KNEE ARTHROSCOPY     LEFT HEART CATH AND CORONARY ANGIOGRAPHY N/A 04/15/2018   Procedure: LEFT HEART CATH AND CORONARY ANGIOGRAPHY;  Surgeon: Jettie Booze, MD;  Location: [place] CV LAB;  Service: Cardiovascular;  Laterality: N/A;   MULTIPLE TOOTH EXTRACTIONS     TONSILLECTOMY     VIDEO BRONCHOSCOPY  04/12/2020   VIDEO BRONCHOSCOPY WITH ENDOBRONCHIAL NAVIGATION N/A 07/26/2016   Procedure: VIDEO BRONCHOSCOPY WITH ENDOBRONCHIAL NAVIGATION;  Surgeon: Collene Gobble, MD;  Location: [place];  Service: Thoracic;  Laterality: N/A;   VIDEO BRONCHOSCOPY WITH ENDOBRONCHIAL NAVIGATION N/A 10/13/2019   Procedure: [facility] AND VIDEO BRONCHOSCOPY WITH ENDOBRONCHIAL NAVIGATION;  Surgeon: Collene Gobble, MD;  Location: [place] ENDOSCOPY;  Service: Pulmonary;  Laterality: N/A;   VIDEO BRONCHOSCOPY  WITH ENDOBRONCHIAL NAVIGATION N/A 04/12/2020   Procedure: VIDEO BRONCHOSCOPY WITH ENDOBRONCHIAL NAVIGATION;  Surgeon: Collene Gobble, MD;  Location: [place] ENDOSCOPY;  Service: Pulmonary;  Laterality: N/A;    Family History  Problem Relation Age of Onset   Hypertension Mother    Alzheimer's disease Mother     Social History   Socioeconomic History   Marital status: Married    Spouse name: Not on file   Number of children: 2   Years of education: Not on file   Highest education level: Not on file  Occupational History   Occupation: Personal assistant, Insurance underwriter, 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  Tobacco Use   Smoking status: Former    Packs/day: 0.75    Years: 67.00    Total pack years: 50.25    Types: Cigarettes    Start date: 13    Quit date: 07/13/2020    Years since quitting: 2.1    Passive exposure: Never   Smokeless tobacco: Never   Tobacco comments:    Recent Quit    Vaping Use   Vaping Use: Former  Substance and Sexual Activity   Alcohol use: No    Alcohol/week: 0.0 standard drinks of alcohol    Comment: quit drinking 2014   Drug use: No   Sexual activity: Not on file  Other Topics Concern   Not on file  Social History Narrative   Has living will   Wife is health care POA--alternate is son Buelah Manis)   Has daughter in [place]   Has DNR   No tube feeds if cognitively unaware   Social Determinants of Health   Financial Resource Strain: Not on file  Food Insecurity: No Food Insecurity (08/13/2022)   Hunger Vital Sign    Worried About Running Out of Food in the Last Year: Never true    Ran Out of Food in the Last Year: Never true  Transportation Needs: No Transportation Needs (08/13/2022)   PRAPARE - Hydrologist (Medical): No    Lack of Transportation (Non-Medical): No  Physical Activity: Not on file  Stress: Not on file  Social Connections: Not on file  Intimate Partner Violence: Not At Risk (08/13/2022)   Humiliation,  Afraid, Rape, and Kick questionnaire    Fear of Current or Ex-Partner: No    Emotionally Abused: No    Physically Abused: No    Sexually Abused: No   Review of Systems Nocturia x 3-4 till last night--only once last night Ongoing edema---though weeping is better Is on the furosemide    Objective:   Physical Exam Constitutional:      Appearance: Normal appearance.  Cardiovascular:     Rate and Rhythm: Normal rate and regular rhythm.     Heart sounds: No murmur heard.    No gallop.  Pulmonary:     Effort: Pulmonary effort is normal.     Breath sounds: No wheezing or rales.     Comments: Little air movement but not tight Musculoskeletal:     Cervical back: Neck supple.     Comments: 1+ puffiness in feet without pitting  Lymphadenopathy:     Cervical: No cervical adenopathy.  Skin:    Comments: 2 ulcers on left foot---great toe and near 1st MTP Marked redness across entire forefoot----still at same level (mid metatarsals) as Tuesdays line Warm No discharge  Neurological:     Mental Status: He is alert.  Psychiatric:        Mood and Affect: Mood normal.            Assessment & Plan:

## 2022-09-20 NOTE — Addendum Note (Signed)
Addended by: Pilar Grammes on: 09/20/2022 12:57 PM   Modules accepted: Orders

## 2022-09-20 NOTE — Assessment & Plan Note (Signed)
Will start silvadene daily Dressing done today here

## 2022-09-20 NOTE — Assessment & Plan Note (Signed)
Seems to be close to baseline since hospitalization Sig restriction in ADLs though

## 2022-09-21 ENCOUNTER — Telehealth: Payer: Self-pay | Admitting: Internal Medicine

## 2022-09-21 NOTE — Telephone Encounter (Signed)
[name], MD  to Me     09/21/22  1:36 PM  Yes---separate the meds   Pt was notified as instructed by Dr Silvio Pate and Dr Darnell Level and pt voiced understanding. Pt said he is feeling OK; pt was napping. I apologized for waking him up and pt will cb if needed and will keep appt with Dr Silvio Pate on Mon.

## 2022-09-21 NOTE — Telephone Encounter (Signed)
FYI: This call has been transferred to Access Nurse. Once the result note has been entered staff can address the message at that time.  Patient called in with the following symptoms:   vomiting (medication reaction)   Please advise at [facility] [phone]  Message is routed to Provider Pool and [facility] Triage

## 2022-09-21 NOTE — Telephone Encounter (Signed)
Noted. Rec take doxycycline with meals, agree with separating levaquin.

## 2022-09-21 NOTE — Telephone Encounter (Signed)
I spoke with pt; pt said he did not take abx at same time yesterday but this morning pt took, doxycycline,levoquin and his other morning meds at same time. Pt was eating breakfast and became nauseated and vomited x 1. About 10 AM this morning. Pt said he has drank coffee since vomited and has kept that down. Pt said right now he feels "fine". Pt said he thinks the redness, swelling and pain in left foot due to cellulitis is a little better today. No fever. Pt said the [facility] nurse is coming this morning to change his dressings. Pt said he thinks he is going to take the abx at different times from now on and if any problem will let [place] know. Offered pt appt but he already has appt to FU with Dr Silvio Pate on 09/24/22 at 11:15 and pt plans to keep that appt. UC & ED precautions given and pt voiced understanding and feels comfortable with his plan to take abx at different times and will try to have eaten prior to taking abx also. Pt states he is at [facility] with nurses available and if needed will call [place] back also. Sending note to Dr Silvio Pate who is out of office but may be looking at desktop later today and Dr Danise Mina who is in office and will teams Lattie Haw CMA with Dr G.pt appreciated concern and call.

## 2022-09-21 NOTE — Telephone Encounter (Signed)
[place] [facility] - Client TELEPHONE ADVICE RECORD [facility] Patient Name: [name] [name] [facility] Gender: Male DOB: 1941/05/23 Age: 82 Y 81 M 30 D Return Phone Number: [phone] (Primary) Address: City/ State/ Zip: [place]  [address] Client [place] [facility] - Client Client Site [place] - Day Provider Viviana Simpler- MD Contact Type Call Who Is Calling Patient / Member / Family / Caregiver Call Type Triage / Clinical Relationship To Patient Self Return Phone Number [phone] (Primary) Chief Complaint Vomiting Reason for Call Symptomatic / Request for [place] states he is having a medication reaction. Caller states he is vomiting. Translation No Nurse Assessment Nurse: Hardin Negus, RN, Mardene Celeste Date/Time Eilene Ghazi Time): 09/21/2022 10:06:23 AM Confirm and document reason for call. If symptomatic, describe symptoms. ---He thinks is having a medication reaction. He is on a levofloxacin. Caller states he is vomiting. He is also taking doxycycline He ate before he took the med. He has thrown up x 2. He has an infection and swelling in his toes. The toes are a little while. He states he is SOB but has always SOB and it is not worse today Does the patient have any new or worsening symptoms? ---Yes Will a triage be completed? ---Yes Related visit to physician within the last 2 weeks? ---No Does the PT have any chronic conditions? (i.e. diabetes, asthma, this includes High risk factors for pregnancy, etc.) ---Yes List chronic conditions. ---COPD, emphysema Is this a behavioral health or substance abuse call? ---No Guidelines Guideline Title Affirmed Question Affirmed Notes Nurse Date/Time (Eastern Time) Vomiting [1] Vomiting AND [2] abdomen looks much more swollen than usual Oren Bracket 09/21/2022 10:10:56 AM PLEASE NOTE: All timestamps contained within  this report are represented as [place] Standard Time. CONFIDENTIALTY NOTICE: This fax transmission is intended only for the addressee. It contains information that is legally privileged, confidential or otherwise protected from use or disclosure. If you are not the intended recipient, you are strictly prohibited from reviewing, disclosing, copying using or disseminating any of this information or taking any action in reliance on or regarding this information. If you have received this fax in error, please notify us immediately by telephone so that we can arrange for its return to [place]. Phone: [phone], Toll-Free: [phone], Fax: [phone] Page: 2 of 2 Call Id: [id number] [place]. Time Eilene Ghazi Time) Disposition Final User 09/21/2022 10:14:50 AM See HCP within 4 Hours (or PCP triage) Yes Hardin Negus, RN, Mardene Celeste Final Disposition 09/21/2022 10:14:50 AM See HCP within 4 Hours (or PCP triage) Yes Hardin Negus, RN, [place] Disagree/Comply Comply Caller Understands Yes PreDisposition Call Doctor Care Advice Given Per Guideline SEE HCP (OR PCP TRIAGE) WITHIN 4 HOURS: * IF OFFICE WILL BE OPEN: You need to be seen within the next 3 or 4 hours. Call your doctor (or NP/PA) now or as soon as the office opens. NOTHING BY MOUTH: * Do not eat or drink anything for now. CALL BACK IF: * You become worse CARE ADVICE per Vomiting (Adult) guideline. Comments User: Ledora Bottcher, RN Date/Time Eilene Ghazi Time): 09/21/2022 10:11:31 AM afib, had a cardioversion on Monday Referrals REFERRED TO PCP OFFIC

## 2022-09-22 ENCOUNTER — Emergency Department: Payer: Medicare Other

## 2022-09-22 ENCOUNTER — Other Ambulatory Visit: Payer: Self-pay

## 2022-09-22 ENCOUNTER — Emergency Department
Admission: EM | Admit: 2022-09-22 | Discharge: 2022-09-22 | Disposition: A | Discharge status: Home / Self Care | Payer: Medicare Other | Attending: Emergency Medicine | Admitting: Emergency Medicine

## 2022-09-22 DIAGNOSIS — Z7901 Long term (current) use of anticoagulants: Secondary | ICD-10-CM | POA: Insufficient documentation

## 2022-09-22 DIAGNOSIS — L03119 Cellulitis of unspecified part of limb: Secondary | ICD-10-CM

## 2022-09-22 DIAGNOSIS — I10 Essential (primary) hypertension: Secondary | ICD-10-CM | POA: Diagnosis not present

## 2022-09-22 DIAGNOSIS — L03116 Cellulitis of left lower limb: Secondary | ICD-10-CM | POA: Diagnosis not present

## 2022-09-22 DIAGNOSIS — J449 Chronic obstructive pulmonary disease, unspecified: Secondary | ICD-10-CM | POA: Insufficient documentation

## 2022-09-22 DIAGNOSIS — M7989 Other specified soft tissue disorders: Secondary | ICD-10-CM | POA: Diagnosis present

## 2022-09-22 DIAGNOSIS — R600 Localized edema: Secondary | ICD-10-CM | POA: Diagnosis not present

## 2022-09-22 LAB — COMPREHENSIVE METABOLIC PANEL
ALT: 15 U/L (ref 0–44)
AST: 20 U/L (ref 15–41)
Albumin: 3.2 g/dL — ABNORMAL LOW (ref 3.5–5.0)
Alkaline Phosphatase: 49 U/L (ref 38–126)
Anion gap: 9 (ref 5–15)
BUN: 28 mg/dL — ABNORMAL HIGH (ref 8–23)
CO2: 31 mmol/L (ref 22–32)
Calcium: 8.7 mg/dL — ABNORMAL LOW (ref 8.9–10.3)
Chloride: 99 mmol/L (ref 98–111)
Creatinine, Ser: 1.18 mg/dL (ref 0.61–1.24)
GFR, Estimated: >60 mL/min (ref >60)
Glucose, Bld: 126 mg/dL — ABNORMAL HIGH (ref 70–99)
Potassium: 4 mmol/L (ref 3.5–5.1)
Sodium: 139 mmol/L (ref 135–145)
Total Bilirubin: 0.5 mg/dL (ref 0.3–1.2)
Total Protein: 6 g/dL — ABNORMAL LOW (ref 6.5–8.1)

## 2022-09-22 LAB — CBC WITH DIFFERENTIAL/PLATELET
Abs Immature Granulocytes: 0.48 10*3/uL — ABNORMAL HIGH (ref 0.00–0.07)
Basophils Absolute: 0.1 10*3/uL (ref 0.0–0.1)
Basophils Relative: 1 %
Eosinophils Absolute: 0 10*3/uL (ref 0.0–0.5)
Eosinophils Relative: 0 %
HCT: 37.5 % — ABNORMAL LOW (ref 39.0–52.0)
Hemoglobin: 11.7 g/dL — ABNORMAL LOW (ref 13.0–17.0)
Immature Granulocytes: 4 %
Lymphocytes Relative: 8 %
Lymphs Abs: 0.9 10*3/uL (ref 0.7–4.0)
MCH: 31 pg (ref 26.0–34.0)
MCHC: 31.2 g/dL (ref 30.0–36.0)
MCV: 99.2 fL (ref 80.0–100.0)
Monocytes Absolute: 0.8 10*3/uL (ref 0.1–1.0)
Monocytes Relative: 7 %
Neutro Abs: 9.3 10*3/uL — ABNORMAL HIGH (ref 1.7–7.7)
Neutrophils Relative %: 80 %
Platelets: 368 10*3/uL (ref 150–400)
RBC: 3.78 MIL/uL — ABNORMAL LOW (ref 4.22–5.81)
RDW: 14.6 % (ref 11.5–15.5)
WBC: 11.5 10*3/uL — ABNORMAL HIGH (ref 4.0–10.5)
nRBC: 0 % (ref 0.0–0.2)

## 2022-09-22 LAB — LACTIC ACID, PLASMA
Lactic Acid, Venous: 2.1 mmol/L (ref 0.5–1.9)
Lactic Acid, Venous: 1 mmol/L (ref 0.5–1.9)

## 2022-09-22 NOTE — ED Triage Notes (Signed)
Pt states he his having left foot swelling. Has infection in his toes and taking antibiotics but not getting better. Pt is on 3L O2 all the time.

## 2022-09-22 NOTE — ED Provider Notes (Addendum)
[facility] Provider Note    Event Date/Time   First MD Initiated Contact with Patient 09/22/22 1153     (approximate)   History   Chief Complaint: Foot Swelling and Leg Swelling   HPI  [name] [name]. is a 82 y.o. male with a history of GERD, COPD on chronic nasal cannula oxygen, hypertension, atrial flutter on Eliquis who comes ED complaining of left foot swelling.  Symptoms initially started about 7 weeks ago with toe pain, then was seen in primary care 4 days ago for increased redness swelling and pain in the distal foot.  No fever.  X-ray done at that time was negative for signs of osteomyelitis.  He was started on doxycycline  which he has been taking.  2 days ago he was reassessed by primary care, found to be not worse, not improved.  Given an IM dose of Rocephin and added Levaquin.    No vomiting or diarrhea.  No chest pain or shortness of breath.  Today the pain in the foot is improved.  Patient came to the ED because staff at [facility] were worried.      Physical Exam   Triage Vital Signs: ED Triage Vitals  Enc Vitals Group     BP 09/22/22 1059 (!) 136/114     Pulse Rate 09/22/22 1059 62     Resp 09/22/22 1059 (!) 22     Temp 09/22/22 1059 (!) 97.5 F (36.4 C)     Temp Source 09/22/22 1059 Oral     SpO2 09/22/22 1059 96 %     Weight --      Height --      Head Circumference --      Peak Flow --      Pain Score 09/22/22 1100 3     Pain Loc --      Pain Edu? --      Excl. in [place]? --     Most recent vital signs: Vitals:   09/22/22 1255 09/22/22 1330  BP: (!) 126/56 114/62  Pulse: 92 78  Resp:    Temp:    SpO2: 100% 100%    General: Awake, no distress.  CV:  Good peripheral perfusion.  Normal DP pulse and capillary refill Resp:  Normal effort.  Abd:  No distention.  Other:  There is soft edema of the left foot without crepitus.  There is erythema of the distal foot, no induration or warmth or tenderness.  No purulent  drainage.  No necrosis.  There are a few superficial wounds and areas of previous blistering which are healing appropriately and have been treated with silver sulfadiazine at home.   ED Results / Procedures / Treatments   Labs (all labs ordered are listed, but only abnormal results are displayed) Labs Reviewed  CBC WITH DIFFERENTIAL/PLATELET - Abnormal; Notable for the following components:      Result Value   WBC 11.5 (*)    RBC 3.78 (*)    Hemoglobin 11.7 (*)    HCT 37.5 (*)    Neutro Abs 9.3 (*)    Abs Immature Granulocytes 0.48 (*)    All other components within normal limits  COMPREHENSIVE METABOLIC PANEL - Abnormal; Notable for the following components:   Glucose, Bld 126 (*)    BUN 28 (*)    Calcium 8.7 (*)    Total Protein 6.0 (*)    Albumin 3.2 (*)    All other components within normal  limits  LACTIC ACID, PLASMA - Abnormal; Notable for the following components:   Lactic Acid, Venous 2.1 (*)    All other components within normal limits  LACTIC ACID, PLASMA     EKG    RADIOLOGY X-ray left foot from September 18, 2022 interpreted by me, appears normal without bony destruction.  Radiology report reviewed.  MRI left foot pending   PROCEDURES:  Procedures   MEDICATIONS ORDERED IN ED: Medications - No data to display   IMPRESSION / MDM / [place] / ED COURSE  I reviewed the triage vital signs and the nursing notes.  DDx: Soft tissue edema, cellulitis, osteomyelitis.  Doubt necrotizing fasciitis DVT abscess, septic arthritis  Patient's presentation is most consistent with acute presentation with potential threat to life or bodily function.  Patient comes ED for evaluation of left foot pain and swelling.  It does appear to be somewhat improved today compared to previous imaging seen in primary care note and provided by patient's spouse at bedside.  The erythema is more dull and not as bright red and glossy as previously.  The tips of the toes are  returning to normal.  The leading edge of erythema has regressed back from the marked edge of the skin over the dorsal hindfoot.  Will obtain MRI of the foot today to rule out osteomyelitis.  If negative I think patient can continue his course of doxycycline and Levaquin which should be a thorough antibiotic regimen, and continue follow-up with primary care or his podiatrist that he has seen in the past, Dr. Bjorn Loser from [facility] foot.   ----------------------------------------- 2:51 PM on 09/22/2022 ----------------------------------------- Labs normal.  MRI report pending      FINAL CLINICAL IMPRESSION(S) / ED DIAGNOSES   Final diagnoses:  Cellulitis of foot     Rx / DC Orders   ED Discharge Orders     None        Note:  This document was prepared using Dragon voice recognition software and may include unintentional dictation errors.   Carrie Mew, MD 09/22/22 1451    Carrie Mew, MD 09/22/22 [phone]

## 2022-09-24 ENCOUNTER — Encounter: Payer: Self-pay | Admitting: Internal Medicine

## 2022-09-24 ENCOUNTER — Ambulatory Visit (INDEPENDENT_AMBULATORY_CARE_PROVIDER_SITE_OTHER): Payer: Medicare Other | Admitting: Internal Medicine

## 2022-09-24 VITALS — BP 134/70 | HR 78 | Temp 97.1°F | Ht 70.5 in | Wt 158.0 lb

## 2022-09-24 DIAGNOSIS — R278 Other lack of coordination: Secondary | ICD-10-CM | POA: Diagnosis not present

## 2022-09-24 DIAGNOSIS — I2510 Atherosclerotic heart disease of native coronary artery without angina pectoris: Secondary | ICD-10-CM | POA: Diagnosis not present

## 2022-09-24 DIAGNOSIS — R2689 Other abnormalities of gait and mobility: Secondary | ICD-10-CM | POA: Diagnosis not present

## 2022-09-24 DIAGNOSIS — L03116 Cellulitis of left lower limb: Secondary | ICD-10-CM | POA: Diagnosis not present

## 2022-09-24 DIAGNOSIS — Z9981 Dependence on supplemental oxygen: Secondary | ICD-10-CM | POA: Diagnosis not present

## 2022-09-24 DIAGNOSIS — J441 Chronic obstructive pulmonary disease with (acute) exacerbation: Secondary | ICD-10-CM | POA: Diagnosis not present

## 2022-09-24 DIAGNOSIS — J431 Panlobular emphysema: Secondary | ICD-10-CM | POA: Diagnosis not present

## 2022-09-24 DIAGNOSIS — J9621 Acute and chronic respiratory failure with hypoxia: Secondary | ICD-10-CM | POA: Diagnosis not present

## 2022-09-24 DIAGNOSIS — I4891 Unspecified atrial fibrillation: Secondary | ICD-10-CM | POA: Diagnosis not present

## 2022-09-24 DIAGNOSIS — M79672 Pain in left foot: Secondary | ICD-10-CM

## 2022-09-24 DIAGNOSIS — I4811 Longstanding persistent atrial fibrillation: Secondary | ICD-10-CM | POA: Diagnosis not present

## 2022-09-24 DIAGNOSIS — M6281 Muscle weakness (generalized): Secondary | ICD-10-CM | POA: Diagnosis not present

## 2022-09-24 DIAGNOSIS — Z741 Need for assistance with personal care: Secondary | ICD-10-CM | POA: Diagnosis not present

## 2022-09-24 MED ORDER — CEFTRIAXONE SODIUM 1 G IJ SOLR
1.0000 g | Freq: Once | INTRAMUSCULAR | Status: AC
  Administered 2022-09-24: 1 g via INTRAMUSCULAR

## 2022-09-24 MED ORDER — DOXYCYCLINE HYCLATE 100 MG PO TABS: 100.0000 mg | ORAL_TABLET | Freq: Two times a day (BID) | ORAL | 0 refills | Status: DC

## 2022-09-24 NOTE — Assessment & Plan Note (Signed)
Seems slightly worse than 2/22 Was in ER 2/24 and MRI didn't show bone involvement Will try rocephin 1gm IM again Continue doxy/levaquin Recheck tomorrow--to ER again if worsens

## 2022-09-24 NOTE — Addendum Note (Signed)
Addended by: Pilar Grammes on: 09/24/2022 12:25 PM   Modules accepted: Orders

## 2022-09-24 NOTE — Progress Notes (Addendum)
Subjective:    Patient ID: [name] Kemps., male    DOB: 16-Oct-1940, 82 y.o.   MRN: [id number]  HPI Here with wife for follow up of severe foot infection  Went to ER---they thought the foot was stable Did MRI---no clear osteomyelitis Some pain No fever  Current Outpatient Medications on File Prior to Visit  Medication Sig Dispense Refill   acetaminophen (TYLENOL) 325 MG tablet Take 650 mg by mouth every 4 (four) hours as needed.     acetaminophen-codeine (TYLENOL #2) 300-15 MG tablet Take 1-2 tablets by mouth every 4 (four) hours as needed for moderate pain. 60 tablet 0   albuterol (PROVENTIL) (2.5 MG/3ML) 0.083% nebulizer solution Take 2.5 mg by nebulization 2 (two) times daily.     albuterol (VENTOLIN HFA) 108 (90 Base) MCG/ACT inhaler Inhale 2 puffs into the lungs 3 (three) times daily as needed for wheezing or shortness of breath.     ammonium lactate (AMLACTIN) 12 % cream Apply topically at bedtime.     apixaban (ELIQUIS) 5 MG TABS tablet Take 1 tablet (5 mg total) by mouth 2 (two) times daily. 60 tablet 5   Budeson-Glycopyrrol-Formoterol (BREZTRI AEROSPHERE) 160-9-4.8 MCG/ACT AERO Inhale 2 puffs into the lungs 2 (two) times daily. 10.7 g 6   diltiazem (CARDIZEM CD) 120 MG 24 hr capsule Take 1 capsule (120 mg total) by mouth 2 (two) times daily. 180 capsule 3   finasteride (PROSCAR) 5 MG tablet Take 1 tablet (5 mg total) by mouth daily. 90 tablet 3   furosemide (LASIX) 20 MG tablet Take one tablet (20 mg) by mouth daily with an addition tablet to equal (40 mg) every other day. 90 tablet 3   gabapentin (NEURONTIN) 100 MG capsule Take 100 mg by mouth 2 (two) times daily.     Infant Care Products [facility] EX) Apply to sacrum daily and as needed     ipratropium-albuterol (DUONEB) 0.5-2.5 (3) MG/3ML SOLN Take 3 mLs by nebulization every 6 (six) hours as needed. 360 mL 1   ketoconazole (NIZORAL) 2 % cream Apply topically 2 (two) times daily.     levofloxacin (LEVAQUIN) 500 MG  tablet Take 1 tablet (500 mg total) by mouth daily. 10 tablet 0   Melatonin 5 MG CAPS Take 5 mg by mouth at bedtime.     nitroGLYCERIN (NITROSTAT) 0.4 MG SL tablet Place 1 tablet (0.4 mg total) under the tongue every 5 (five) minutes as needed for chest pain. 25 tablet 4   OXYGEN Inhale into the lungs. 2 liters upon exertion and 3 liters continuous at night.     pantoprazole (PROTONIX) 40 MG tablet Take 40 mg by mouth daily.     polyethylene glycol (MIRALAX / GLYCOLAX) 17 g packet Take 17 g by mouth as needed.     rosuvastatin (CRESTOR) 20 MG tablet Take 1 tablet (20 mg total) by mouth at bedtime. 90 tablet 2   silver sulfADIAZINE (SILVADENE) 1 % cream Apply 1 Application topically daily. 50 g 0   clonazePAM (KLONOPIN) 0.5 MG tablet Take 1 tablet (0.5 mg total) by mouth daily as needed for anxiety. 30 tablet 0   No current facility-administered medications on file prior to visit.    Allergies  Allergen Reactions   Flagyl [Metronidazole] Other (See Comments)    Other reaction(s): skin reaction    Past Medical History:  Diagnosis Date   Abdominal discomfort 12/07/2020   Acquired trigger finger of right middle finger 02/04/2020   Acute prostatitis 04/08/2018  Alcohol abuse    Alcohol dependence ([place]) 12/07/2020   Anal or rectal pain 04/08/2018   Annual physical exam 12/07/2020   Anorectal disorder 12/07/2020   Arthritis    Atherosclerotic heart disease of native coronary artery without angina pectoris 04/08/2018   Benign prostatic hyperplasia with lower urinary tract symptoms 12/07/2020   BPH with elevated PSA    Cancer ([place])    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   Chronic obstructive pulmonary disease with (acute) exacerbation (HCC) 12/07/2020   Chronic respiratory failure with hypoxia (HCC) 08/10/2019   Chronic sinusitis 12/07/2020   Cigarette nicotine dependence, uncomplicated [id number]   Coagulation disorder ([place]) 01/13/2019   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   Contracture of palmar fascia 12/07/2020   COPD (chronic obstructive pulmonary disease) ([place])    Corn of toe 12/07/2020   Corns and callosities 04/09/2018   Coronary artery disease    2019 with stents   Coronary atherosclerosis 05/07/2018   Degenerative disc disease, 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   Degenerative disc disease, 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   Diarrhea 04/09/2018   Double vision with both eyes open 12/07/2020   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   Dysphagia 08/14/2018   Dyspnea    ED (erectile dysfunction)    ED (erectile dysfunction) of organic origin 12/07/2020   Elevated prostate specific antigen (PSA) 04/08/2018   Elevated PSA    Encounter for screening for other disorder 12/07/2020   Enlarged prostate without lower urinary tract symptoms (luts) 04/09/2018   Enthesopathy 12/07/2020   Essential (primary) hypertension 04/08/2018   Ex-smoker 04/09/2018   Exertional dyspnea 04/02/2018   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   Flatulence 04/08/2018   Gastro-esophageal reflux disease without esophagitis 04/08/2018   GERD (gastroesophageal reflux disease)    History of acute otitis externa 08/14/2018   Hyperlipidemia    Hypertension    Hypoxemia 12/07/2020   Kidney cysts    Left knee pain 12/07/2020   Low back pain 04/08/2018   Male erectile dysfunction 04/09/2018   Mixed hyperlipidemia 04/08/2018   Nocturia more than twice per night 04/09/2018   Osteoarthritis of first carpometacarpal joint 04/08/2018   Osteoarthritis of knee 12/07/2020   Other long term (current) drug therapy 12/07/2020   Pain due to onychomycosis of toenail of left foot 07/20/2019   Pain in joint 04/09/2018   Pain in knee 04/09/2018   Pain in unspecified knee 12/07/2020   Pain of finger 04/09/2018   Palpitations    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[place]) 12/07/2020   Personal history of colonic polyps 12/07/2020   Pneumonia    Pneumothorax 04/12/2020   Polypharmacy  04/09/2018   Porokeratosis 01/13/2019   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[place])    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/07/2020   Pulmonary nodules 06/08/2016   Pure hypercholesterolemia 12/07/2020   Renal cyst 12/07/2020   Sleep disorder 04/08/2018   Status post bronchoscopy 04/12/2020   Tear of rotator cuff 04/09/2018   Tobacco user [id number]   Uncomplicated alcohol dependence ([place]) 04/09/2018   Unspecified rotator cuff tear or rupture of unspecified shoulder, not specified as traumatic 12/07/2020   Unspecified tear of unspecified meniscus, current injury, unspecified knee, initial encounter 12/07/2020   Visual disturbance 12/07/2020   Vitamin D deficiency 04/08/2018    Past Surgical History:  Procedure Laterality Date   BRONCHIAL BIOPSY  10/13/2019   Procedure: BRONCHIAL BIOPSIES;  Surgeon: Collene Gobble, MD;  Location: [place];  Service: Pulmonary;;   BRONCHIAL BIOPSY  04/12/2020   Procedure:  BRONCHIAL BIOPSIES;  Surgeon: Collene Gobble, MD;  Location: [facility] ENDOSCOPY;  Service: Pulmonary;;   BRONCHIAL BRUSHINGS  10/13/2019   Procedure: BRONCHIAL BRUSHINGS;  Surgeon: Collene Gobble, MD;  Location: [facility] ENDOSCOPY;  Service: Pulmonary;;   BRONCHIAL BRUSHINGS  04/12/2020   Procedure: BRONCHIAL BRUSHINGS;  Surgeon: Collene Gobble, MD;  Location: [facility] ENDOSCOPY;  Service: Pulmonary;;   BRONCHIAL NEEDLE ASPIRATION BIOPSY  10/13/2019   Procedure: BRONCHIAL NEEDLE ASPIRATION BIOPSIES;  Surgeon: Collene Gobble, MD;  Location: [facility] ENDOSCOPY;  Service: Pulmonary;;   BRONCHIAL NEEDLE ASPIRATION BIOPSY  04/12/2020   Procedure: BRONCHIAL NEEDLE ASPIRATION BIOPSIES;  Surgeon: Collene Gobble, MD;  Location: [facility] ENDOSCOPY;  Service: Pulmonary;;   BRONCHIAL WASHINGS  10/13/2019   Procedure: BRONCHIAL WASHINGS;  Surgeon: Collene Gobble, MD;  Location: [place];  Service: Pulmonary;;   BRONCHIAL WASHINGS  04/12/2020   Procedure: BRONCHIAL WASHINGS;  Surgeon: Collene Gobble, MD;  Location: [place];  Service:  Pulmonary;;   CARDIAC CATHETERIZATION  2002   50% RCA   CARDIOVERSION N/A 09/17/2022   Procedure: CARDIOVERSION;  Surgeon: Wellington Hampshire, MD;  Location: [facility] ORS;  Service: Cardiovascular;  Laterality: N/A;   CATARACT EXTRACTION, BILATERAL     CORONARY STENT INTERVENTION N/A 04/15/2018   Procedure: CORONARY STENT INTERVENTION;  Surgeon: Jettie Booze, MD;  Location: [place] CV LAB;  Service: Cardiovascular;  Laterality: N/A;  om1   EYE SURGERY     KNEE ARTHROSCOPY     LEFT HEART CATH AND CORONARY ANGIOGRAPHY N/A 04/15/2018   Procedure: LEFT HEART CATH AND CORONARY ANGIOGRAPHY;  Surgeon: Jettie Booze, MD;  Location: [place] CV LAB;  Service: Cardiovascular;  Laterality: N/A;   MULTIPLE TOOTH EXTRACTIONS     TONSILLECTOMY     VIDEO BRONCHOSCOPY  04/12/2020   VIDEO BRONCHOSCOPY WITH ENDOBRONCHIAL NAVIGATION N/A 07/26/2016   Procedure: VIDEO BRONCHOSCOPY WITH ENDOBRONCHIAL NAVIGATION;  Surgeon: Collene Gobble, MD;  Location: [place];  Service: Thoracic;  Laterality: N/A;   VIDEO BRONCHOSCOPY WITH ENDOBRONCHIAL NAVIGATION N/A 10/13/2019   Procedure: [facility] AND VIDEO BRONCHOSCOPY WITH ENDOBRONCHIAL NAVIGATION;  Surgeon: Collene Gobble, MD;  Location: [place] ENDOSCOPY;  Service: Pulmonary;  Laterality: N/A;   VIDEO BRONCHOSCOPY WITH ENDOBRONCHIAL NAVIGATION N/A 04/12/2020   Procedure: VIDEO BRONCHOSCOPY WITH ENDOBRONCHIAL NAVIGATION;  Surgeon: Collene Gobble, MD;  Location: [place] ENDOSCOPY;  Service: Pulmonary;  Laterality: N/A;    Family History  Problem Relation Age of Onset   Hypertension Mother    Alzheimer's disease Mother     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  Tobacco Use   Smoking status: Former    Packs/day: 0.75     Years: 67.00    Total pack years: 50.25    Types: Cigarettes    Start date: 1954    Quit date: 07/13/2020    Years since quitting: 2.2    Passive exposure: Never   Smokeless tobacco: Never   Tobacco comments:    Recent Quit    Vaping Use   Vaping Use: Former  Substance and Sexual Activity   Alcohol use: No    Alcohol/week: 0.0 standard drinks of alcohol    Comment: quit drinking 2014  Drug use: No   Sexual activity: Not on file  Other Topics Concern   Not on file  Social History Narrative   Has living will   Wife is health care POA--alternate is son Buelah Manis)   Has daughter in [place]   Has DNR   No tube feeds if cognitively unaware   Social Determinants of Health   Financial Resource Strain: Not on file  Food Insecurity: No Food Insecurity (08/13/2022)   Hunger Vital Sign    Worried About Running Out of Food in the Last Year: Never true    Ran Out of Food in the Last Year: Never true  Transportation Needs: No Transportation Needs (08/13/2022)   PRAPARE - Hydrologist (Medical): No    Lack of Transportation (Non-Medical): No  Physical Activity: Not on file  Stress: Not on file  Social Connections: Not on file  Intimate Partner Violence: Not At Risk (08/13/2022)   Humiliation, Afraid, Rape, and Kick questionnaire    Fear of Current or Ex-Partner: No    Emotionally Abused: No    Physically Abused: No    Sexually Abused: No   Review of Systems No nausea or vomiting---since separating the antibiotics Eating okay     Objective:   Physical Exam Constitutional:      Appearance: Normal appearance.  Cardiovascular:     Rate and Rhythm: Normal rate and regular rhythm.  Pulmonary:     Effort: Pulmonary effort is normal.     Breath sounds: No wheezing or rales.     Comments: Decreased breath sounds but clear Skin:    Comments: Redness and puffiness of entire left forefoot--including toes Some warmth and tenderness  Several  other granulating ulcers--both calves (not infected though)  Neurological:     Mental Status: He is alert.            Assessment & Plan:

## 2022-09-25 ENCOUNTER — Ambulatory Visit (INDEPENDENT_AMBULATORY_CARE_PROVIDER_SITE_OTHER): Payer: Medicare Other | Admitting: Internal Medicine

## 2022-09-25 ENCOUNTER — Encounter: Payer: Self-pay | Admitting: Internal Medicine

## 2022-09-25 VITALS — BP 106/60 | HR 62 | Temp 97.3°F | Ht 70.5 in | Wt 158.0 lb

## 2022-09-25 DIAGNOSIS — L03116 Cellulitis of left lower limb: Secondary | ICD-10-CM | POA: Diagnosis not present

## 2022-09-25 MED ORDER — CEFTRIAXONE SODIUM 1 G IJ SOLR
1.0000 g | Freq: Once | INTRAMUSCULAR | Status: AC
  Administered 2022-09-25: 1 g via INTRAMUSCULAR

## 2022-09-25 NOTE — Addendum Note (Signed)
Addended by: Pilar Grammes on: 09/25/2022 01:36 PM   Modules accepted: Orders

## 2022-09-25 NOTE — Progress Notes (Signed)
Subjective:    Patient ID: [name] [name]., male    DOB: 11-Sep-1940, 82 y.o.   MRN: [id number]  HPI Here for follow up of left foot cellulitis  They feel it is some better Continues on the doxy/levaquin  Less swollen Mostly not painful--but it gets irritated in bed  Current Outpatient Medications on File Prior to Visit  Medication Sig Dispense Refill   acetaminophen (TYLENOL) 325 MG tablet Take 650 mg by mouth every 4 (four) hours as needed.     acetaminophen-codeine (TYLENOL #2) 300-15 MG tablet Take 1-2 tablets by mouth every 4 (four) hours as needed for moderate pain. 60 tablet 0   albuterol (PROVENTIL) (2.5 MG/3ML) 0.083% nebulizer solution Take 2.5 mg by nebulization 2 (two) times daily.     albuterol (VENTOLIN HFA) 108 (90 Base) MCG/ACT inhaler Inhale 2 puffs into the lungs 3 (three) times daily as needed for wheezing or shortness of breath.     ammonium lactate (AMLACTIN) 12 % cream Apply topically at bedtime.     apixaban (ELIQUIS) 5 MG TABS tablet Take 1 tablet (5 mg total) by mouth 2 (two) times daily. 60 tablet 5   Budeson-Glycopyrrol-Formoterol (BREZTRI AEROSPHERE) 160-9-4.8 MCG/ACT AERO Inhale 2 puffs into the lungs 2 (two) times daily. 10.7 g 6   diltiazem (CARDIZEM CD) 120 MG 24 hr capsule Take 1 capsule (120 mg total) by mouth 2 (two) times daily. 180 capsule 3   doxycycline (VIBRA-TABS) 100 MG tablet Take 1 tablet (100 mg total) by mouth 2 (two) times daily. 14 tablet 0   finasteride (PROSCAR) 5 MG tablet Take 1 tablet (5 mg total) by mouth daily. 90 tablet 3   furosemide (LASIX) 20 MG tablet Take one tablet (20 mg) by mouth daily with an addition tablet to equal (40 mg) every other day. 90 tablet 3   gabapentin (NEURONTIN) 100 MG capsule Take 100 mg by mouth 2 (two) times daily.     Infant Care Products [facility] EX) Apply to sacrum daily and as needed     ipratropium-albuterol (DUONEB) 0.5-2.5 (3) MG/3ML SOLN Take 3 mLs by nebulization every 6 (six) hours as  needed. 360 mL 1   ketoconazole (NIZORAL) 2 % cream Apply topically 2 (two) times daily.     levofloxacin (LEVAQUIN) 500 MG tablet Take 1 tablet (500 mg total) by mouth daily. 10 tablet 0   Melatonin 5 MG CAPS Take 5 mg by mouth at bedtime.     nitroGLYCERIN (NITROSTAT) 0.4 MG SL tablet Place 1 tablet (0.4 mg total) under the tongue every 5 (five) minutes as needed for chest pain. 25 tablet 4   OXYGEN Inhale into the lungs. 2 liters upon exertion and 3 liters continuous at night.     pantoprazole (PROTONIX) 40 MG tablet Take 40 mg by mouth daily.     polyethylene glycol (MIRALAX / GLYCOLAX) 17 g packet Take 17 g by mouth as needed.     rosuvastatin (CRESTOR) 20 MG tablet Take 1 tablet (20 mg total) by mouth at bedtime. 90 tablet 2   silver sulfADIAZINE (SILVADENE) 1 % cream Apply 1 Application topically daily. 50 g 0   clonazePAM (KLONOPIN) 0.5 MG tablet Take 1 tablet (0.5 mg total) by mouth daily as needed for anxiety. 30 tablet 0   No current facility-administered medications on file prior to visit.    Allergies  Allergen Reactions   Flagyl [Metronidazole] Other (See Comments)    Other reaction(s): skin reaction    Past  Medical History:  Diagnosis Date   Abdominal discomfort 12/07/2020   Acquired trigger finger of right middle finger 02/04/2020   Acute prostatitis 04/08/2018   Alcohol abuse    Alcohol dependence ([place]) 12/07/2020   Anal or rectal pain 04/08/2018   Annual physical exam 12/07/2020   Anorectal disorder 12/07/2020   Arthritis    Atherosclerotic heart disease of native coronary artery without angina pectoris 04/08/2018   Benign prostatic hyperplasia with lower urinary tract symptoms 12/07/2020   BPH with elevated PSA    Cancer ([place])    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   Chronic obstructive pulmonary disease with (acute) exacerbation ([place]) 12/07/2020   Chronic respiratory failure with hypoxia ([place]) 08/10/2019   Chronic sinusitis 12/07/2020   Cigarette nicotine  dependence, uncomplicated [id number]   Coagulation disorder ([place]) 01/13/2019   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   Contracture of palmar fascia 12/07/2020   COPD (chronic obstructive pulmonary disease) ([place])    Corn of toe 12/07/2020   Corns and callosities 04/09/2018   Coronary artery disease    2019 with stents   Coronary atherosclerosis 05/07/2018   Degenerative disc disease, 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   Degenerative disc disease, 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   Diarrhea 04/09/2018   Double vision with both eyes open 12/07/2020   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   Dysphagia 08/14/2018   Dyspnea    ED (erectile dysfunction)    ED (erectile dysfunction) of organic origin 12/07/2020   Elevated prostate specific antigen (PSA) 04/08/2018   Elevated PSA    Encounter for screening for other disorder 12/07/2020   Enlarged prostate without lower urinary tract symptoms (luts) 04/09/2018   Enthesopathy 12/07/2020   Essential (primary) hypertension 04/08/2018   Ex-smoker 04/09/2018   Exertional dyspnea 04/02/2018   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   Flatulence 04/08/2018   Gastro-esophageal reflux disease without esophagitis 04/08/2018   GERD (gastroesophageal reflux disease)    History of acute otitis externa 08/14/2018   Hyperlipidemia    Hypertension    Hypoxemia 12/07/2020   Kidney cysts    Left knee pain 12/07/2020   Low back pain 04/08/2018   Male erectile dysfunction 04/09/2018   Mixed hyperlipidemia 04/08/2018   Nocturia more than twice per night 04/09/2018   Osteoarthritis of first carpometacarpal joint 04/08/2018   Osteoarthritis of knee 12/07/2020   Other long term (current) drug therapy 12/07/2020   Pain due to onychomycosis of toenail of left foot 07/20/2019   Pain in joint 04/09/2018   Pain in knee 04/09/2018   Pain in unspecified knee 12/07/2020   Pain of finger 04/09/2018   Palpitations    Peripheral vascular  disease ([place]) 12/07/2020   Personal history of colonic polyps 12/07/2020   Pneumonia    Pneumothorax 04/12/2020   Polypharmacy 04/09/2018   Porokeratosis 01/13/2019   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[place])    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[place]) 12/07/2020   Pulmonary nodules 06/08/2016   Pure hypercholesterolemia 12/07/2020   Renal cyst 12/07/2020   Sleep disorder 04/08/2018   Status post bronchoscopy 04/12/2020   Tear of rotator cuff 04/09/2018   Tobacco user [id number]   Uncomplicated alcohol dependence ([place]) 04/09/2018   Unspecified rotator cuff tear or rupture of unspecified shoulder, not specified as traumatic 12/07/2020   Unspecified tear of unspecified meniscus, current injury, unspecified knee, initial encounter 12/07/2020   Visual disturbance 12/07/2020   Vitamin D deficiency 04/08/2018    Past Surgical History:  Procedure Laterality Date   BRONCHIAL BIOPSY  10/13/2019  Procedure: BRONCHIAL BIOPSIES;  Surgeon: Collene Gobble, MD;  Location: [facility] ENDOSCOPY;  Service: Pulmonary;;   BRONCHIAL BIOPSY  04/12/2020   Procedure: BRONCHIAL BIOPSIES;  Surgeon: Collene Gobble, MD;  Location: [facility] ENDOSCOPY;  Service: Pulmonary;;   BRONCHIAL BRUSHINGS  10/13/2019   Procedure: BRONCHIAL BRUSHINGS;  Surgeon: Collene Gobble, MD;  Location: [facility] ENDOSCOPY;  Service: Pulmonary;;   BRONCHIAL BRUSHINGS  04/12/2020   Procedure: BRONCHIAL BRUSHINGS;  Surgeon: Collene Gobble, MD;  Location: [facility] ENDOSCOPY;  Service: Pulmonary;;   BRONCHIAL NEEDLE ASPIRATION BIOPSY  10/13/2019   Procedure: BRONCHIAL NEEDLE ASPIRATION BIOPSIES;  Surgeon: Collene Gobble, MD;  Location: [facility] ENDOSCOPY;  Service: Pulmonary;;   BRONCHIAL NEEDLE ASPIRATION BIOPSY  04/12/2020   Procedure: BRONCHIAL NEEDLE ASPIRATION BIOPSIES;  Surgeon: Collene Gobble, MD;  Location: [facility] ENDOSCOPY;  Service: Pulmonary;;   BRONCHIAL WASHINGS  10/13/2019   Procedure: BRONCHIAL WASHINGS;  Surgeon: Collene Gobble, MD;  Location: [place];  Service: Pulmonary;;    BRONCHIAL WASHINGS  04/12/2020   Procedure: BRONCHIAL WASHINGS;  Surgeon: Collene Gobble, MD;  Location: [place];  Service: Pulmonary;;   CARDIAC CATHETERIZATION  2002   50% RCA   CARDIOVERSION N/A 09/17/2022   Procedure: CARDIOVERSION;  Surgeon: Wellington Hampshire, MD;  Location: [facility] ORS;  Service: Cardiovascular;  Laterality: N/A;   CATARACT EXTRACTION, BILATERAL     CORONARY STENT INTERVENTION N/A 04/15/2018   Procedure: CORONARY STENT INTERVENTION;  Surgeon: Jettie Booze, MD;  Location: [place] CV LAB;  Service: Cardiovascular;  Laterality: N/A;  om1   EYE SURGERY     KNEE ARTHROSCOPY     LEFT HEART CATH AND CORONARY ANGIOGRAPHY N/A 04/15/2018   Procedure: LEFT HEART CATH AND CORONARY ANGIOGRAPHY;  Surgeon: Jettie Booze, MD;  Location: [place] CV LAB;  Service: Cardiovascular;  Laterality: N/A;   MULTIPLE TOOTH EXTRACTIONS     TONSILLECTOMY     VIDEO BRONCHOSCOPY  04/12/2020   VIDEO BRONCHOSCOPY WITH ENDOBRONCHIAL NAVIGATION N/A 07/26/2016   Procedure: VIDEO BRONCHOSCOPY WITH ENDOBRONCHIAL NAVIGATION;  Surgeon: Collene Gobble, MD;  Location: [place];  Service: Thoracic;  Laterality: N/A;   VIDEO BRONCHOSCOPY WITH ENDOBRONCHIAL NAVIGATION N/A 10/13/2019   Procedure: [facility] AND VIDEO BRONCHOSCOPY WITH ENDOBRONCHIAL NAVIGATION;  Surgeon: Collene Gobble, MD;  Location: [place] ENDOSCOPY;  Service: Pulmonary;  Laterality: N/A;   VIDEO BRONCHOSCOPY WITH ENDOBRONCHIAL NAVIGATION N/A 04/12/2020   Procedure: VIDEO BRONCHOSCOPY WITH ENDOBRONCHIAL NAVIGATION;  Surgeon: Collene Gobble, MD;  Location: [place] ENDOSCOPY;  Service: Pulmonary;  Laterality: N/A;    Family History  Problem Relation Age of Onset   Hypertension Mother    Alzheimer's disease Mother     Social History   Socioeconomic History   Marital status: Married    Spouse name: Not on file   Number of children: 2   Years of education: Not on file   Highest education level: Not on file  Occupational History    Occupation: Personal assistant, insurance, 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  Tobacco Use   Smoking status: Former    Packs/day: 0.75    Years: 67.00    Total pack years: 50.25    Types: Cigarettes    Start date: 1954    Quit date: 07/13/2020    Years since quitting: 2.2    Passive exposure: Never   Smokeless tobacco: Never   Tobacco comments:    Recent Quit    Vaping Use   Vaping Use: Former  Substance and  Sexual Activity   Alcohol use: No    Alcohol/week: 0.0 standard drinks of alcohol    Comment: quit drinking 2014   Drug use: No   Sexual activity: Not on file  Other Topics Concern   Not on file  Social History Narrative   Has living will   Wife is health care POA--alternate is son Buelah Manis)   Has daughter in [place]   Has DNR   No tube feeds if cognitively unaware   Social Determinants of Health   Financial Resource Strain: Not on file  Food Insecurity: No Food Insecurity (08/13/2022)   Hunger Vital Sign    Worried About Running Out of Food in the Last Year: Never true    Ran Out of Food in the Last Year: Never true  Transportation Needs: No Transportation Needs (08/13/2022)   PRAPARE - Hydrologist (Medical): No    Lack of Transportation (Non-Medical): No  Physical Activity: Not on file  Stress: Not on file  Social Connections: Not on file  Intimate Partner Violence: Not At Risk (08/13/2022)   Humiliation, Afraid, Rape, and Kick questionnaire    Fear of Current or Ex-Partner: No    Emotionally Abused: No    Physically Abused: No    Sexually Abused: No   Review of Systems No fever Appetite is off some Took laxative 2 days ago---did too much     Objective:   Physical Exam Constitutional:      Appearance: Normal appearance.  Cardiovascular:     Comments: Faint pedal pulse Skin:    Comments: Left foot is better---less swollen Redness is less apparent in proximal part of the foot  Neurological:     Mental Status: He is  alert.            Assessment & Plan:

## 2022-09-25 NOTE — Assessment & Plan Note (Signed)
Still very red and inflamed--but clearly has improved Will continue doxy/levaquin Rocephin 1gm again today--and recheck tomorrow

## 2022-09-26 ENCOUNTER — Ambulatory Visit: Payer: Medicare Other | Attending: Nurse Practitioner | Admitting: Nurse Practitioner

## 2022-09-26 ENCOUNTER — Encounter: Payer: Self-pay | Admitting: Internal Medicine

## 2022-09-26 ENCOUNTER — Encounter: Payer: Self-pay | Admitting: Nurse Practitioner

## 2022-09-26 ENCOUNTER — Ambulatory Visit (INDEPENDENT_AMBULATORY_CARE_PROVIDER_SITE_OTHER): Payer: Medicare Other | Admitting: Internal Medicine

## 2022-09-26 VITALS — BP 103/62 | HR 107 | Ht 70.0 in | Wt 158.0 lb

## 2022-09-26 VITALS — BP 98/60 | HR 60 | Temp 97.9°F | Ht 70.5 in | Wt 145.0 lb

## 2022-09-26 DIAGNOSIS — E782 Mixed hyperlipidemia: Secondary | ICD-10-CM

## 2022-09-26 DIAGNOSIS — J9611 Chronic respiratory failure with hypoxia: Secondary | ICD-10-CM | POA: Diagnosis not present

## 2022-09-26 DIAGNOSIS — L03116 Cellulitis of left lower limb: Secondary | ICD-10-CM

## 2022-09-26 DIAGNOSIS — I2510 Atherosclerotic heart disease of native coronary artery without angina pectoris: Secondary | ICD-10-CM

## 2022-09-26 DIAGNOSIS — I10 Essential (primary) hypertension: Secondary | ICD-10-CM | POA: Diagnosis not present

## 2022-09-26 DIAGNOSIS — L97521 Non-pressure chronic ulcer of other part of left foot limited to breakdown of skin: Secondary | ICD-10-CM

## 2022-09-26 DIAGNOSIS — I483 Typical atrial flutter: Secondary | ICD-10-CM | POA: Diagnosis not present

## 2022-09-26 MED ORDER — ACETAMINOPHEN-CODEINE 300-30 MG PO TABS: 1.0000 | ORAL_TABLET | ORAL | 0 refills | Status: DC | PRN

## 2022-09-26 MED ORDER — DIGOXIN 125 MCG PO TABS: 0.1250 mg | ORAL_TABLET | Freq: Every day | ORAL | 3 refills | Status: DC

## 2022-09-26 MED ORDER — CEFTRIAXONE SODIUM 1 G IJ SOLR
1.0000 g | Freq: Once | INTRAMUSCULAR | Status: AC
  Administered 2022-09-26: 1 g via INTRAMUSCULAR

## 2022-09-26 NOTE — Assessment & Plan Note (Signed)
Still with slough but improved Will redress here with silvadene

## 2022-09-26 NOTE — Assessment & Plan Note (Signed)
Finally starting to clearly respond Will continue doxy/levaquin orally Rocephin again--and will plan at least 2 more doses

## 2022-09-26 NOTE — Progress Notes (Signed)
Office Visit    Patient Name: [name] [name]. Date of Encounter: 09/26/2022  Primary Care Provider:  [name], MD Primary Cardiologist:  Kathlyn Sacramento, MD  Chief Complaint    82 y/o ? w/a h/o CAD, COPD on home O2, pulmonary nodules, HTN, HL, R subclavian stenosis, GERD, and prior tobacco/etoh use, who presents for f/u of atrial flutter after recent cardioversion.  Past Medical History    Past Medical History:  Diagnosis Date   Abdominal discomfort 12/07/2020   Acquired trigger finger of right middle finger 02/04/2020   Acute prostatitis 04/08/2018   Alcohol abuse    Alcohol dependence ([place]) 12/07/2020   Anal or rectal pain 04/08/2018   Annual physical exam 12/07/2020   Anorectal disorder 12/07/2020   Arthritis    Atherosclerotic heart disease of native coronary artery without angina pectoris 04/08/2018   a. 03/2018 PCI: LM nl, LAD 31m LCX nl, OM1 70p (2.75x8 Synergy DES), 971m2.5x24 Synergy DES), RCA 25p, RPL1 50. EF 55-65%.   Atrial flutter ([facility]   a. 07/2022 in setting of hospitalization for resp illness/RSV. CHA2DS2VASc = 4-->eliquis; b. 08/2022 s/p DCCV (100J); c. 09/26/2022 Recurrent Aflutter.   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 02/2022 Carotid U/S: 1-39% bilat ICA stenoses.   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   Contracture of palmar fascia 12/07/2020   COPD (chronic obstructive pulmonary disease) (HCE[place]   Corn of toe 12/07/2020   Corns and callosities 04/09/2018   Coronary artery  disease    2019 with stents   Coronary atherosclerosis 05/07/2018   Degenerative disc disease, 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   Degenerative disc disease, 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   Diarrhea 04/09/2018   Double vision with both eyes open 12/07/2020   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   Dysphagia 08/14/2018   Dyspnea    ED (erectile dysfunction)    ED (erectile dysfunction) of organic origin 12/07/2020   Elevated prostate specific antigen (PSA) 04/08/2018   Elevated PSA    Encounter for screening for other disorder 12/07/2020   Enlarged prostate without lower urinary tract symptoms (luts) 04/09/2018   Enthesopathy 12/07/2020   Essential (primary) hypertension 04/08/2018   Ex-smoker 04/09/2018   Exertional dyspnea 04/02/2018   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 07/2022 Echo: EF 65-70%, no rwma, mild LVH, nl RV fxn.   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[place]) 12/07/2020   Personal history of colonic polyps 12/07/2020   Pneumonia    Pneumothorax 04/12/2020   Polypharmacy 04/09/2018   Porokeratosis 01/13/2019   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[place])    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   Pulmonary  nodules 06/08/2016   Pure hypercholesterolemia 12/07/2020   Renal cyst 12/07/2020   Sleep disorder 04/08/2018   Status post bronchoscopy 04/12/2020   Tear of rotator cuff 04/09/2018   Tobacco user [id number]   Uncomplicated alcohol dependence ([place]) 04/09/2018   Unspecified rotator cuff tear or rupture of unspecified shoulder, not specified as traumatic 12/07/2020   Unspecified tear of unspecified meniscus, current injury, unspecified knee, initial encounter 12/07/2020   Visual disturbance 12/07/2020   Vitamin D deficiency 04/08/2018   Past Surgical History:  Procedure Laterality Date   BRONCHIAL BIOPSY  10/13/2019   Procedure: BRONCHIAL BIOPSIES;  Surgeon: Collene Gobble, MD;  Location: [place];  Service: Pulmonary;;   BRONCHIAL BIOPSY  04/12/2020   Procedure: BRONCHIAL BIOPSIES;  Surgeon: Collene Gobble, MD;  Location: [place];  Service: Pulmonary;;   BRONCHIAL BRUSHINGS  10/13/2019   Procedure: BRONCHIAL BRUSHINGS;  Surgeon: Collene Gobble, MD;  Location: [facility] ENDOSCOPY;  Service: Pulmonary;;   BRONCHIAL BRUSHINGS  04/12/2020   Procedure: BRONCHIAL BRUSHINGS;  Surgeon: Collene Gobble, MD;  Location: [facility] ENDOSCOPY;  Service: Pulmonary;;   BRONCHIAL NEEDLE ASPIRATION BIOPSY  10/13/2019   Procedure: BRONCHIAL NEEDLE ASPIRATION BIOPSIES;  Surgeon: Collene Gobble, MD;  Location: [facility] ENDOSCOPY;  Service: Pulmonary;;   BRONCHIAL NEEDLE ASPIRATION BIOPSY  04/12/2020   Procedure: BRONCHIAL NEEDLE ASPIRATION BIOPSIES;  Surgeon: Collene Gobble, MD;  Location: [facility] ENDOSCOPY;  Service: Pulmonary;;   BRONCHIAL WASHINGS  10/13/2019   Procedure: BRONCHIAL WASHINGS;  Surgeon: Collene Gobble, MD;  Location: [place];  Service: Pulmonary;;   BRONCHIAL WASHINGS  04/12/2020   Procedure: BRONCHIAL WASHINGS;  Surgeon: Collene Gobble, MD;  Location: [place];  Service: Pulmonary;;   CARDIAC CATHETERIZATION  2002   50% RCA   CARDIOVERSION N/A 09/17/2022   Procedure: CARDIOVERSION;  Surgeon:  Wellington Hampshire, MD;  Location: [facility] ORS;  Service: Cardiovascular;  Laterality: N/A;   CATARACT EXTRACTION, BILATERAL     CORONARY STENT INTERVENTION N/A 04/15/2018   Procedure: CORONARY STENT INTERVENTION;  Surgeon: Jettie Booze, MD;  Location: [place] CV LAB;  Service: Cardiovascular;  Laterality: N/A;  om1   EYE SURGERY     KNEE ARTHROSCOPY     LEFT HEART CATH AND CORONARY ANGIOGRAPHY N/A 04/15/2018   Procedure: LEFT HEART CATH AND CORONARY ANGIOGRAPHY;  Surgeon: Jettie Booze, MD;  Location: [place] CV LAB;  Service: Cardiovascular;  Laterality: N/A;   MULTIPLE TOOTH EXTRACTIONS     TONSILLECTOMY     VIDEO BRONCHOSCOPY  04/12/2020   VIDEO BRONCHOSCOPY WITH ENDOBRONCHIAL NAVIGATION N/A 07/26/2016   Procedure: VIDEO BRONCHOSCOPY WITH ENDOBRONCHIAL NAVIGATION;  Surgeon: Collene Gobble, MD;  Location: [place];  Service: Thoracic;  Laterality: N/A;   VIDEO BRONCHOSCOPY WITH ENDOBRONCHIAL NAVIGATION N/A 10/13/2019   Procedure: [facility] AND VIDEO BRONCHOSCOPY WITH ENDOBRONCHIAL NAVIGATION;  Surgeon: Collene Gobble, MD;  Location: [place] ENDOSCOPY;  Service: Pulmonary;  Laterality: N/A;   VIDEO BRONCHOSCOPY WITH ENDOBRONCHIAL NAVIGATION N/A 04/12/2020   Procedure: VIDEO BRONCHOSCOPY WITH ENDOBRONCHIAL NAVIGATION;  Surgeon: Collene Gobble, MD;  Location: [facility]  ENDOSCOPY;  Service: Pulmonary;  Laterality: N/A;    Allergies  Allergies  Allergen Reactions   Flagyl [Metronidazole] Other (See Comments)    Other reaction(s): skin reaction    History of Present Illness    82 y/o ? w/a h/o CAD, COPD on home O2, pulmonary nodules, HTN, HL, R subclavian stenosis, GERD, and prior tobacco/etoh use.  He previous underwent diagnostic catheterization September 2019 with finding of severe disease in the obtuse marginal with 2 drug-eluting stents placed.  In January of this year, he presented to [facility] with worsening dyspnea.  He was found to be in atrial flutter with rapid ventricular response and  required BiPAP.  Respiratory panel returned positive for RSV.  His blood glucose was 500, and troponin was mildly elevated at 31.  He was admitted and treated with steroids, antibiotics, nebulizers, and Cardizem infusion, which was subsequently transitioned to oral Cardizem.  Echocardiogram was performed and showed an EF of 65 to 70% with mild LVH and no regional wall motion abnormalities or valvular abnormalities.  In the setting of a CHA2DS2-VASc of 4, he was placed on Eliquis and subsequently discharged home.  At follow-up on February 1, he remained in atrial flutter at a rate of 87 bpm.  He also was noted to have lower extremity swelling.  Lasix was increased to 40 mg  alternating with 20 mg every other day.  He underwent cardioversion on February 19, which was successful.    Following cardioversion, he says that for about a day or so he noted some improvement in his breathing but otherwise has had chronic, relatively stable dyspnea with minimal activity.  He was seen in the emergency department February 24 due to left foot swelling and cellulitis.  MRI was negative for osteomyelitis.  He has been followed closely by primary care and is now on doxycycline and levofloxacin.  He has also been receiving intramuscular ceftriaxone injections through primary care.  He has noted some improvement in color of his left foot.  He does not check his oxygen saturation via pulse ox regularly and has been noting heart rates in the 60s.  Heart rate earlier today at primary care office was 60 bpm.  This afternoon however, Mr. Forino is noted to be in atrial flutter at 107 bpm.  He denies any change in baseline level of dyspnea.  No chest pain, palpitations, PND, orthopnea, dizziness, syncope, or early satiety.  Home Medications    Current Outpatient Medications  Medication Sig Dispense Refill   acetaminophen (TYLENOL) 325 MG tablet Take 650 mg by mouth every 4 (four) hours as needed.     acetaminophen-codeine (TYLENOL  #3) 300-30 MG tablet Take 1 tablet by mouth every 4 (four) hours as needed for moderate pain. 30 tablet 0   albuterol (PROVENTIL) (2.5 MG/3ML) 0.083% nebulizer solution Take 2.5 mg by nebulization 2 (two) times daily.     albuterol (VENTOLIN HFA) 108 (90 Base) MCG/ACT inhaler Inhale 2 puffs into the lungs 3 (three) times daily as needed for wheezing or shortness of breath.     ammonium lactate (AMLACTIN) 12 % cream Apply topically at bedtime.     apixaban (ELIQUIS) 5 MG TABS tablet Take 1 tablet (5 mg total) by mouth 2 (two) times daily. 60 tablet 5   Budeson-Glycopyrrol-Formoterol (BREZTRI AEROSPHERE) 160-9-4.8 MCG/ACT AERO Inhale 2 puffs into the lungs 2 (two) times daily. 10.7 g 6   digoxin (LANOXIN) 0.125 MG tablet Take 1 tablet (0.125 mg total) by mouth daily. 90 tablet  3   diltiazem (CARDIZEM CD) 120 MG 24 hr capsule Take 1 capsule (120 mg total) by mouth 2 (two) times daily. 180 capsule 3   doxycycline (VIBRA-TABS) 100 MG tablet Take 1 tablet (100 mg total) by mouth 2 (two) times daily. 14 tablet 0   finasteride (PROSCAR) 5 MG tablet Take 1 tablet (5 mg total) by mouth daily. 90 tablet 3   furosemide (LASIX) 20 MG tablet Take one tablet (20 mg) by mouth daily with an addition tablet to equal (40 mg) every other day. 90 tablet 3   gabapentin (NEURONTIN) 100 MG capsule Take 100 mg by mouth 2 (two) times daily.     [employer] [facility] EX) Apply to sacrum daily and as needed     ipratropium-albuterol (DUONEB) 0.5-2.5 (3) MG/3ML SOLN Take 3 mLs by nebulization every 6 (six) hours as needed. 360 mL 1   ketoconazole (NIZORAL) 2 % cream Apply topically 2 (two) times daily.     levofloxacin (LEVAQUIN) 500 MG tablet Take 1 tablet (500 mg total) by mouth daily. 10 tablet 0   Melatonin 5 MG CAPS Take 5 mg by mouth at bedtime.     nitroGLYCERIN (NITROSTAT) 0.4 MG SL tablet Place 1 tablet (0.4 mg total) under the tongue every 5 (five) minutes as needed for chest pain. 25 tablet 4   OXYGEN  Inhale into the lungs. 2 liters upon exertion and 3 liters continuous at night.     pantoprazole (PROTONIX) 40 MG tablet Take 40 mg by mouth daily.     polyethylene glycol (MIRALAX / GLYCOLAX) 17 g packet Take 17 g by mouth as needed.     rosuvastatin (CRESTOR) 20 MG tablet Take 1 tablet (20 mg total) by mouth at bedtime. 90 tablet 2   silver sulfADIAZINE (SILVADENE) 1 % cream Apply 1 Application topically daily. 50 g 0   clonazePAM (KLONOPIN) 0.5 MG tablet Take 1 tablet (0.5 mg total) by mouth daily as needed for anxiety. 30 tablet 0   No current facility-administered medications for this visit.     Review of Systems    Chronic dyspnea with minimal activity-uses home O2.  Bilateral lower extremity edema with cellulitis of the left foot as outlined above.  He denies chest pain, palpitations, PND, orthopnea, dizziness, syncope, or early satiety.  All other systems reviewed and are otherwise negative except as noted above.    Physical Exam    VS:  BP 103/62 (BP Location: Left Arm, Patient Position: Sitting, Cuff Size: Normal)   Pulse (!) 107   Ht '5\' 10"'$  (1.778 m)   Wt 158 lb (71.7 kg)   SpO2 96%   BMI 22.67 kg/m  , BMI Body mass index is 22.67 kg/m.     GEN: Well nourished, well developed, in no acute distress. HEENT: normal. Neck: Supple, no JVD, carotid bruits, or masses. Cardiac: Irregular, distant, no murmurs, rubs, or gallops. No clubbing, cyanosis, 2+ bilateral ankle and pedal edema.  Radials 2+/PT 1+ and equal bilaterally.  Respiratory:  Respirations regular and unlabored, diminished breath sounds bilaterally with scattered rhonchi.   GI: Soft, nontender, nondistended, BS + x 4. MS: no deformity or atrophy. Skin: warm and dry, no rash. Neuro:  Strength and sensation are intact. Psych: Normal affect.  Accessory Clinical Findings    ECG personally reviewed by me today - Atrial flutter, 107, rightward axis - no acute changes.  Lab Results  Component Value Date   WBC 11.5  (H) 09/22/2022   HGB  11.7 (L) 09/22/2022   HCT 37.5 (L) 09/22/2022   MCV 99.2 09/22/2022   PLT 368 09/22/2022   Lab Results  Component Value Date   CREATININE 1.18 09/22/2022   BUN 28 (H) 09/22/2022   NA 139 09/22/2022   K 4.0 09/22/2022   CL 99 09/22/2022   CO2 31 09/22/2022   Lab Results  Component Value Date   ALT 15 09/22/2022   AST 20 09/22/2022   ALKPHOS 49 09/22/2022   BILITOT 0.5 09/22/2022   Lab Results  Component Value Date   CHOL 141 08/13/2022   HDL 80 08/13/2022   LDLCALC 52 08/13/2022   TRIG 46 08/13/2022   CHOLHDL 1.8 08/13/2022    Lab Results  Component Value Date   HGBA1C 5.0 08/13/2022    Assessment & Plan    1.  Atrial flutter with rapid ventricular response: Patient hospitalized in January with RSV/respiratory failure was noted to have rapid atrial flutter.  This was reasonably rate controlled on oral diltiazem therapy though that dose has been limited by soft blood pressures.  He underwent cardioversion on February 19, which was successful.  He has been trending his heart rates at home and has noted rates typically in the 60s, confirmed by primary care vital signs including earlier today.  This afternoon however, he is back in atrial flutter at 107 bpm.  He denies symptoms.  He remains anticoagulated on Eliquis.  Blood pressure remains soft 103/62.  Continue Cardizem CD 120 mg daily.  Will add low-dose digoxin 0.125 mg and have provided him with a prescription for follow-up digoxin level, which he plans to have performed at lab at [facility] on Monday, March 4.  I reviewed his case with Dr. Quentin Ore and we will arrange for early follow-up with Dr. Quentin Ore to discuss ablative options.  2.  Coronary artery disease: Status post prior drug-eluting stent placement to the first obtuse marginal in 2019.  Despite recent admission for respiratory failure, troponin was only minimally elevated at 31 with subsequent downtrend.  He denies chest pain.  Echo showed  normal LV function.  No plan for further ischemic evaluation at this time.  He remains on rosuvastatin therapy.  No aspirin in the setting of Eliquis.  3.  Essential hypertension: Blood pressures trend soft.  He is on low-dose diltiazem.  4.  Hyperlipidemia: LDL of 52 in January.  Continue statin therapy.  5.  Left lower extremity cellulitis: Ongoing antibiotic therapy being managed by primary care.  In the setting of cellulitis, he does have lower extremity edema and remains on alternating doses of Lasix 40/20 mg daily.  Renal function relatively stable on February 24.  If edema persists following antibiotic therapy, would have a low threshold to titrate diuretic further.  6.  COPD/Chronic Respiratory failure: On home O2 with chronic dyspnea.  Moves very little air on examination.  Saturation stable.  Inhalers/nebulizers per primary care.  7.  Disposition: Follow-up digoxin level on Monday, March 4.  Follow-up with Dr. Lambert-electrophysiology in 2 weeks.   Murray Hodgkins, NP 09/26/2022, 3:53 PM

## 2022-09-26 NOTE — Patient Instructions (Signed)
Medication Instructions:   START Digoxin - take one tablet ( 0.125 mg) by mouth daily.   *If you need a refill on your cardiac medications before your next appointment, please call your pharmacy*   Lab Work:  I have print the order for you to get labs.   If you have labs (blood work) drawn today and your tests are completely normal, you will receive your results only by: [place] (if you have MyChart) OR A paper copy in the mail If you have any lab test that is abnormal or we need to change your treatment, we will call you to review the results.   Testing/Procedures:  None Ordered   Follow-Up: At [facility], you and your health needs are our priority.  As part of our continuing mission to provide you with exceptional heart care, we have created designated Provider Care Teams.  These Care Teams include your primary Cardiologist (physician) and Advanced Practice Providers (APPs -  Physician Assistants and Nurse Practitioners) who all work together to provide you with the care you need, when you need it.  We recommend signing up for the patient portal called "MyChart".  Sign up information is provided on this After Visit Summary.  MyChart is used to connect with patients for Virtual Visits (Telemedicine).  Patients are able to view lab/test results, encounter notes, upcoming appointments, etc.  Non-urgent messages can be sent to your provider as well.   To learn more about what you can do with MyChart, go to [email or web address].    Your next appointment:    Dr. Quentin Ore - 10/10/2022 @ 1:40pm.

## 2022-09-26 NOTE — Addendum Note (Signed)
Addended by: Pilar Grammes on: 09/26/2022 11:57 AM   Modules accepted: Orders

## 2022-09-26 NOTE — Progress Notes (Signed)
Subjective:    Patient ID: [name] [name]., male    DOB: 01-02-1941, 82 y.o.   MRN: [id number]  HPI Here with wife for follow up of left foot cellulitis No new problems with meds No fever  Now having more pain in the toes Used some left over tylenol #3----helped him sleep and calmed the pain for 6 hours  Foot is less swollen and they think it is better  Current Outpatient Medications on File Prior to Visit  Medication Sig Dispense Refill   acetaminophen (TYLENOL) 325 MG tablet Take 650 mg by mouth every 4 (four) hours as needed.     acetaminophen-codeine (TYLENOL #2) 300-15 MG tablet Take 1-2 tablets by mouth every 4 (four) hours as needed for moderate pain. 60 tablet 0   albuterol (PROVENTIL) (2.5 MG/3ML) 0.083% nebulizer solution Take 2.5 mg by nebulization 2 (two) times daily.     albuterol (VENTOLIN HFA) 108 (90 Base) MCG/ACT inhaler Inhale 2 puffs into the lungs 3 (three) times daily as needed for wheezing or shortness of breath.     ammonium lactate (AMLACTIN) 12 % cream Apply topically at bedtime.     apixaban (ELIQUIS) 5 MG TABS tablet Take 1 tablet (5 mg total) by mouth 2 (two) times daily. 60 tablet 5   Budeson-Glycopyrrol-Formoterol (BREZTRI AEROSPHERE) 160-9-4.8 MCG/ACT AERO Inhale 2 puffs into the lungs 2 (two) times daily. 10.7 g 6   diltiazem (CARDIZEM CD) 120 MG 24 hr capsule Take 1 capsule (120 mg total) by mouth 2 (two) times daily. 180 capsule 3   doxycycline (VIBRA-TABS) 100 MG tablet Take 1 tablet (100 mg total) by mouth 2 (two) times daily. 14 tablet 0   finasteride (PROSCAR) 5 MG tablet Take 1 tablet (5 mg total) by mouth daily. 90 tablet 3   furosemide (LASIX) 20 MG tablet Take one tablet (20 mg) by mouth daily with an addition tablet to equal (40 mg) every other day. 90 tablet 3   gabapentin (NEURONTIN) 100 MG capsule Take 100 mg by mouth 2 (two) times daily.     Infant Care Products [facility] EX) Apply to sacrum daily and as needed      ipratropium-albuterol (DUONEB) 0.5-2.5 (3) MG/3ML SOLN Take 3 mLs by nebulization every 6 (six) hours as needed. 360 mL 1   ketoconazole (NIZORAL) 2 % cream Apply topically 2 (two) times daily.     levofloxacin (LEVAQUIN) 500 MG tablet Take 1 tablet (500 mg total) by mouth daily. 10 tablet 0   Melatonin 5 MG CAPS Take 5 mg by mouth at bedtime.     nitroGLYCERIN (NITROSTAT) 0.4 MG SL tablet Place 1 tablet (0.4 mg total) under the tongue every 5 (five) minutes as needed for chest pain. 25 tablet 4   OXYGEN Inhale into the lungs. 2 liters upon exertion and 3 liters continuous at night.     pantoprazole (PROTONIX) 40 MG tablet Take 40 mg by mouth daily.     polyethylene glycol (MIRALAX / GLYCOLAX) 17 g packet Take 17 g by mouth as needed.     rosuvastatin (CRESTOR) 20 MG tablet Take 1 tablet (20 mg total) by mouth at bedtime. 90 tablet 2   silver sulfADIAZINE (SILVADENE) 1 % cream Apply 1 Application topically daily. 50 g 0   clonazePAM (KLONOPIN) 0.5 MG tablet Take 1 tablet (0.5 mg total) by mouth daily as needed for anxiety. 30 tablet 0   No current facility-administered medications on file prior to visit.    Allergies  Allergen Reactions   Flagyl [Metronidazole] Other (See Comments)    Other reaction(s): skin reaction    Past Medical History:  Diagnosis Date   Abdominal discomfort 12/07/2020   Acquired trigger finger of right middle finger 02/04/2020   Acute prostatitis 04/08/2018   Alcohol abuse    Alcohol dependence ([place]) 12/07/2020   Anal or rectal pain 04/08/2018   Annual physical exam 12/07/2020   Anorectal disorder 12/07/2020   Arthritis    Atherosclerotic heart disease of native coronary artery without angina pectoris 04/08/2018   Benign prostatic hyperplasia with lower urinary tract symptoms 12/07/2020   BPH with elevated PSA    Cancer ([place])    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   Chronic obstructive pulmonary disease with (acute) exacerbation ([place]) 12/07/2020   Chronic  respiratory failure with hypoxia ([place]) 08/10/2019   Chronic sinusitis 12/07/2020   Cigarette nicotine dependence, uncomplicated [id number]   Coagulation disorder ([place]) 01/13/2019   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   Contracture of palmar fascia 12/07/2020   COPD (chronic obstructive pulmonary disease) ([place])    Corn of toe 12/07/2020   Corns and callosities 04/09/2018   Coronary artery disease    2019 with stents   Coronary atherosclerosis 05/07/2018   Degenerative disc disease, 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   Degenerative disc disease, 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   Diarrhea 04/09/2018   Double vision with both eyes open 12/07/2020   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   Dysphagia 08/14/2018   Dyspnea    ED (erectile dysfunction)    ED (erectile dysfunction) of organic origin 12/07/2020   Elevated prostate specific antigen (PSA) 04/08/2018   Elevated PSA    Encounter for screening for other disorder 12/07/2020   Enlarged prostate without lower urinary tract symptoms (luts) 04/09/2018   Enthesopathy 12/07/2020   Essential (primary) hypertension 04/08/2018   Ex-smoker 04/09/2018   Exertional dyspnea 04/02/2018   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   Flatulence 04/08/2018   Gastro-esophageal reflux disease without esophagitis 04/08/2018   GERD (gastroesophageal reflux disease)    History of acute otitis externa 08/14/2018   Hyperlipidemia    Hypertension    Hypoxemia 12/07/2020   Kidney cysts    Left knee pain 12/07/2020   Low back pain 04/08/2018   Male erectile dysfunction 04/09/2018   Mixed hyperlipidemia 04/08/2018   Nocturia more than twice per night 04/09/2018   Osteoarthritis of first carpometacarpal joint 04/08/2018   Osteoarthritis of knee 12/07/2020   Other long term (current) drug therapy 12/07/2020   Pain due to onychomycosis of toenail of left foot 07/20/2019   Pain in joint 04/09/2018   Pain in knee 04/09/2018   Pain  in unspecified knee 12/07/2020   Pain of finger 04/09/2018   Palpitations    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[place]) 12/07/2020   Personal history of colonic polyps 12/07/2020   Pneumonia    Pneumothorax 04/12/2020   Polypharmacy 04/09/2018   Porokeratosis 01/13/2019   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[place])    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   Pulmonary nodules 06/08/2016   Pure hypercholesterolemia 12/07/2020   Renal cyst 12/07/2020   Sleep disorder 04/08/2018   Status post bronchoscopy 04/12/2020   Tear of rotator cuff 04/09/2018   Tobacco user [id number]   Uncomplicated alcohol dependence ([place]) 04/09/2018   Unspecified rotator cuff tear or rupture of unspecified shoulder, not specified as traumatic 12/07/2020   Unspecified tear of unspecified meniscus, current injury, unspecified knee, initial encounter 12/07/2020   Visual disturbance 12/07/2020  Vitamin D deficiency 04/08/2018    Past Surgical History:  Procedure Laterality Date   BRONCHIAL BIOPSY  10/13/2019   Procedure: BRONCHIAL BIOPSIES;  Surgeon: Collene Gobble, MD;  Location: [facility] ENDOSCOPY;  Service: Pulmonary;;   BRONCHIAL BIOPSY  04/12/2020   Procedure: BRONCHIAL BIOPSIES;  Surgeon: Collene Gobble, MD;  Location: [facility] ENDOSCOPY;  Service: Pulmonary;;   BRONCHIAL BRUSHINGS  10/13/2019   Procedure: BRONCHIAL BRUSHINGS;  Surgeon: Collene Gobble, MD;  Location[facility] END[facility]COPY;  Service: Pulmonary;;   BRONCHIAL BRUSHINGS  04/12/2020   Procedure: BRONCHIAL BRUSHINGS;  Surgeon: Collene Gobble, MD;  Location: [facility] ENDOSCOPY;  Service: Pulmonary;;   BRONCHIAL NEEDLE ASPIRATION BIOPSY  10/13/2019   Procedure: BRONCHIAL NEEDLE ASPIRATION BIOPSIES;  Surgeon: Collene Gobble, MD;  Location: [facility] ENDOSCOPY;  Service: Pulmonary;;   BRONCHIAL NEEDLE ASPIRATION BIOPSY  04/12/2020   Procedure: BRONCHIAL NEEDLE ASPIRATION BIOPSIES;  Surgeon: Collene Gobble, MD;  Location: [facility] ENDOSCOPY;  Service: Pulmonary;;   BRONCHIAL WASHINGS  10/13/2019   Procedure:  BRONCHIAL WASHINGS;  Surgeon: Collene Gobble, MD;  Location: [place];  Service: Pulmonary;;   BRONCHIAL WASHINGS  04/12/2020   Procedure: BRONCHIAL WASHINGS;  Surgeon: Collene Gobble, MD;  Location: [place];  Service: Pulmonary;;   CARDIAC CATHETERIZATION  2002   50% RCA   CARDIOVERSION N/A 09/17/2022   Procedure: CARDIOVERSION;  Surgeon: Wellington Hampshire, MD;  Location: [facility] ORS;  Service: Cardiovascular;  Laterality: N/A;   CATARACT EXTRACTION, BILATERAL     CORONARY STENT INTERVENTION N/A 04/15/2018   Procedure: CORONARY STENT INTERVENTION;  Surgeon: Jettie Booze, MD;  Location: [place] CV LAB;  Service: Cardiovascular;  Laterality: N/A;  om1   EYE SURGERY     KNEE ARTHROSCOPY     LEFT HEART CATH AND CORONARY ANGIOGRAPHY N/A 04/15/2018   Procedure: LEFT HEART CATH AND CORONARY ANGIOGRAPHY;  Surgeon: Jettie Booze, MD;  Location: [place] CV LAB;  Service: Cardiovascular;  Laterality: N/A;   MULTIPLE TOOTH EXTRACTIONS     TONSILLECTOMY     VIDEO BRONCHOSCOPY  04/12/2020   VIDEO BRONCHOSCOPY WITH ENDOBRONCHIAL NAVIGATION N/A 07/26/2016   Procedure: VIDEO BRONCHOSCOPY WITH ENDOBRONCHIAL NAVIGATION;  Surgeon: Collene Gobble, MD;  Location: [place];  Service: Thoracic;  Laterality: N/A;   VIDEO BRONCHOSCOPY WITH ENDOBRONCHIAL NAVIGATION N/A 10/13/2019   Procedure: [facility] AND VIDEO BRONCHOSCOPY WITH ENDOBRONCHIAL NAVIGATION;  Surgeon: Collene Gobble, MD;  Location: [place] ENDOSCOPY;  Service: Pulmonary;  Laterality: N/A;   VIDEO BRONCHOSCOPY WITH ENDOBRONCHIAL NAVIGATION N/A 04/12/2020   Procedure: VIDEO BRONCHOSCOPY WITH ENDOBRONCHIAL NAVIGATION;  Surgeon: Collene Gobble, MD;  Location: [place] ENDOSCOPY;  Service: Pulmonary;  Laterality: N/A;    Family History  Problem Relation Age of Onset   Hypertension Mother    Alzheimer's disease Mother     Social History   Socioeconomic History   Marital status: Married    Spouse name: Not on file   Number of children: 2    Years of education: Not on file   Highest education level: Not on file  Occupational History   Occupation: Personal assistant, insurance, 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  Tobacco Use   Smoking status: Former    Packs/day: 0.75    Years: 67.00    Total pack years: 50.25    Types: Cigarettes    Start date: 1954    Quit date: 07/13/2020    Years since quitting: 2.2    Passive exposure: Never   Smokeless tobacco: Never  Tobacco comments:    Recent Quit    Vaping Use   Vaping Use: Former  Substance and Sexual Activity   Alcohol use: No    Alcohol/week: 0.0 standard drinks of alcohol    Comment: quit drinking 2014   Drug use: No   Sexual activity: Not on file  Other Topics Concern   Not on file  Social History Narrative   Has living will   Wife is health care POA--alternate is son Buelah Manis)   Has daughter in [place]   Has DNR   No tube feeds if cognitively unaware   Social Determinants of Health   Financial Resource Strain: Not on file  Food Insecurity: No Food Insecurity (08/13/2022)   Hunger Vital Sign    Worried About Running Out of Food in the Last Year: Never true    Ran Out of Food in the Last Year: Never true  Transportation Needs: No Transportation Needs (08/13/2022)   PRAPARE - Hydrologist (Medical): No    Lack of Transportation (Non-Medical): No  Physical Activity: Not on file  Stress: Not on file  Social Connections: Not on file  Intimate Partner Violence: Not At Risk (08/13/2022)   Humiliation, Afraid, Rape, and Kick questionnaire    Fear of Current or Ex-Partner: No    Emotionally Abused: No    Physically Abused: No    Sexually Abused: No   Review of Systems No N/V No diarrhea--done with spurts since the laxative----discussed trying senna-s 1-2 daily     Objective:   Physical Exam Constitutional:      Appearance: Normal appearance.  Skin:    Comments: Clear improvement Redness mostly gone along medial foot to  base of great toe Overall swelling is decreased  Neurological:     Mental Status: He is alert.            Assessment & Plan:

## 2022-09-27 ENCOUNTER — Encounter: Payer: Self-pay | Admitting: Internal Medicine

## 2022-09-27 ENCOUNTER — Telehealth: Payer: Self-pay | Admitting: Internal Medicine

## 2022-09-27 ENCOUNTER — Ambulatory Visit (INDEPENDENT_AMBULATORY_CARE_PROVIDER_SITE_OTHER): Payer: Medicare Other | Admitting: Internal Medicine

## 2022-09-27 VITALS — BP 90/60 | HR 96 | Temp 97.0°F | Ht 70.5 in | Wt 158.0 lb

## 2022-09-27 DIAGNOSIS — Z741 Need for assistance with personal care: Secondary | ICD-10-CM | POA: Diagnosis not present

## 2022-09-27 DIAGNOSIS — M6281 Muscle weakness (generalized): Secondary | ICD-10-CM | POA: Diagnosis not present

## 2022-09-27 DIAGNOSIS — L03116 Cellulitis of left lower limb: Secondary | ICD-10-CM

## 2022-09-27 DIAGNOSIS — I2510 Atherosclerotic heart disease of native coronary artery without angina pectoris: Secondary | ICD-10-CM | POA: Diagnosis not present

## 2022-09-27 DIAGNOSIS — R278 Other lack of coordination: Secondary | ICD-10-CM | POA: Diagnosis not present

## 2022-09-27 DIAGNOSIS — J431 Panlobular emphysema: Secondary | ICD-10-CM | POA: Diagnosis not present

## 2022-09-27 DIAGNOSIS — I4891 Unspecified atrial fibrillation: Secondary | ICD-10-CM | POA: Diagnosis not present

## 2022-09-27 DIAGNOSIS — J441 Chronic obstructive pulmonary disease with (acute) exacerbation: Secondary | ICD-10-CM | POA: Diagnosis not present

## 2022-09-27 DIAGNOSIS — Z9981 Dependence on supplemental oxygen: Secondary | ICD-10-CM | POA: Diagnosis not present

## 2022-09-27 DIAGNOSIS — I4811 Longstanding persistent atrial fibrillation: Secondary | ICD-10-CM | POA: Diagnosis not present

## 2022-09-27 DIAGNOSIS — R2689 Other abnormalities of gait and mobility: Secondary | ICD-10-CM | POA: Diagnosis not present

## 2022-09-27 DIAGNOSIS — J9621 Acute and chronic respiratory failure with hypoxia: Secondary | ICD-10-CM | POA: Diagnosis not present

## 2022-09-27 DIAGNOSIS — I4892 Unspecified atrial flutter: Secondary | ICD-10-CM | POA: Diagnosis not present

## 2022-09-27 MED ORDER — CEFTRIAXONE SODIUM 1 G IJ SOLR
1.0000 g | Freq: Once | INTRAMUSCULAR | Status: AC
  Administered 2022-09-27: 1 g via INTRAMUSCULAR

## 2022-09-27 MED ORDER — LEVOFLOXACIN 500 MG PO TABS: 500.0000 mg | ORAL_TABLET | Freq: Every day | ORAL | 0 refills | Status: DC

## 2022-09-27 MED ORDER — ACETAMINOPHEN-CODEINE 300-30 MG PO TABS: 1.0000 | ORAL_TABLET | ORAL | 0 refills | Status: DC | PRN

## 2022-09-27 NOTE — Telephone Encounter (Signed)
Patient called and said he was told to find a pharmacy that had Tylenol #3 in stock and he said it did. Please send to [facility] on [address]

## 2022-09-27 NOTE — Telephone Encounter (Signed)
Spoke to pt to verify which [facility]. He stated the [facility] location.

## 2022-09-27 NOTE — Progress Notes (Signed)
Subjective:    Patient ID: [name] Kemps., male    DOB: 1940/09/03, 82 y.o.   MRN: [id number]  HPI Here for follow up of left foot cellulitis  No fever Seems better Pain still bad at night---wasn't able to get the tylenol #3 (picking it up this afternoon) No problems with the antibiotics  Started on digoxin for rapid atrial fib Waiting to see Dr Quentin Ore to consider ablation  Current Outpatient Medications on File Prior to Visit  Medication Sig Dispense Refill   acetaminophen (TYLENOL) 325 MG tablet Take 650 mg by mouth every 4 (four) hours as needed.     albuterol (PROVENTIL) (2.5 MG/3ML) 0.083% nebulizer solution Take 2.5 mg by nebulization 2 (two) times daily.     albuterol (VENTOLIN HFA) 108 (90 Base) MCG/ACT inhaler Inhale 2 puffs into the lungs 3 (three) times daily as needed for wheezing or shortness of breath.     ammonium lactate (AMLACTIN) 12 % cream Apply topically at bedtime.     apixaban (ELIQUIS) 5 MG TABS tablet Take 1 tablet (5 mg total) by mouth 2 (two) times daily. 60 tablet 5   Budeson-Glycopyrrol-Formoterol (BREZTRI AEROSPHERE) 160-9-4.8 MCG/ACT AERO Inhale 2 puffs into the lungs 2 (two) times daily. 10.7 g 6   digoxin (LANOXIN) 0.125 MG tablet Take 1 tablet (0.125 mg total) by mouth daily. 90 tablet 3   diltiazem (CARDIZEM CD) 120 MG 24 hr capsule Take 1 capsule (120 mg total) by mouth 2 (two) times daily. 180 capsule 3   doxycycline (VIBRA-TABS) 100 MG tablet Take 1 tablet (100 mg total) by mouth 2 (two) times daily. 14 tablet 0   finasteride (PROSCAR) 5 MG tablet Take 1 tablet (5 mg total) by mouth daily. 90 tablet 3   furosemide (LASIX) 20 MG tablet Take one tablet (20 mg) by mouth daily with an addition tablet to equal (40 mg) every other day. 90 tablet 3   gabapentin (NEURONTIN) 100 MG capsule Take 100 mg by mouth 2 (two) times daily.     Infant Care Products [facility] EX) Apply to sacrum daily and as needed     ipratropium-albuterol (DUONEB) 0.5-2.5  (3) MG/3ML SOLN Take 3 mLs by nebulization every 6 (six) hours as needed. 360 mL 1   ketoconazole (NIZORAL) 2 % cream Apply topically 2 (two) times daily.     levofloxacin (LEVAQUIN) 500 MG tablet Take 1 tablet (500 mg total) by mouth daily. 10 tablet 0   Melatonin 5 MG CAPS Take 5 mg by mouth at bedtime.     nitroGLYCERIN (NITROSTAT) 0.4 MG SL tablet Place 1 tablet (0.4 mg total) under the tongue every 5 (five) minutes as needed for chest pain. 25 tablet 4   OXYGEN Inhale into the lungs. 2 liters upon exertion and 3 liters continuous at night.     pantoprazole (PROTONIX) 40 MG tablet Take 40 mg by mouth daily.     polyethylene glycol (MIRALAX / GLYCOLAX) 17 g packet Take 17 g by mouth as needed.     rosuvastatin (CRESTOR) 20 MG tablet Take 1 tablet (20 mg total) by mouth at bedtime. 90 tablet 2   silver sulfADIAZINE (SILVADENE) 1 % cream Apply 1 Application topically daily. 50 g 0   clonazePAM (KLONOPIN) 0.5 MG tablet Take 1 tablet (0.5 mg total) by mouth daily as needed for anxiety. 30 tablet 0   No current facility-administered medications on file prior to visit.    Allergies  Allergen Reactions   Flagyl [Metronidazole]  Other (See Comments)    Other reaction(s): skin reaction    Past Medical History:  Diagnosis Date   Abdominal discomfort 12/07/2020   Acquired trigger finger of right middle finger 02/04/2020   Acute prostatitis 04/08/2018   Alcohol abuse    Alcohol dependence ([place]) 12/07/2020   Anal or rectal pain 04/08/2018   Annual physical exam 12/07/2020   Anorectal disorder 12/07/2020   Arthritis    Atherosclerotic heart disease of native coronary artery without angina pectoris 04/08/2018   a. 03/2018 PCI: LM nl, LAD 54m LCX nl, OM1 70p (2.75x8 Synergy DES), 957m2.5x24 Synergy DES), RCA 25p, RPL1 50. EF 55-65%.   Atrial flutter ([facility]   a. 07/2022 in setting of hospitalization for resp illness/RSV. CHA2DS2VASc = 4-->eliquis; b. 08/2022 s/p DCCV (100J); c. 09/26/2022 Recurrent  Aflutter.   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 02/2022 Carotid U/S: 1-39% bilat ICA stenoses.   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   Contracture of palmar fascia 12/07/2020   COPD (chronic obstructive pulmonary disease) ([facility]   Corn of toe 12/07/2020   Corns and callosities 04/09/2018   Coronary artery disease    2019 with stents   Coronary atherosclerosis 05/07/2018   Degenerative disc disease, 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   Degenerative disc disease, 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   Diarrhea 04/09/2018   Double vision with both eyes open 12/07/2020   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   Dysphagia 08/14/2018   Dyspnea    ED (erectile dysfunction)    ED (erectile dysfunction) of organic origin 12/07/2020   Elevated prostate specific antigen (PSA) 04/08/2018   Elevated PSA    Encounter for screening for other disorder 12/07/2020   Enlarged prostate without lower urinary tract symptoms (luts) 04/09/2018   Enthesopathy 12/07/2020   Essential (primary) hypertension 04/08/2018   Ex-smoker 04/09/2018   Exertional dyspnea 04/02/2018   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 07/2022 Echo: EF 65-70%, no rwma, mild LVH, nl RV fxn.   Hyperlipidemia    Hypertension    Hypoxemia 12/07/2020   Kidney cysts    Left knee pain  12/07/2020   Low back pain 04/08/2018   Male erectile dysfunction 04/09/2018   Mixed hyperlipidemia 04/08/2018   Nocturia more than twice per night 04/09/2018   Osteoarthritis of first carpometacarpal joint 04/08/2018   Osteoarthritis of knee 12/07/2020   Other long term (current) drug therapy 12/07/2020   Pain due to onychomycosis of toenail of left foot 07/20/2019   Pain in joint 04/09/2018   Pain in knee 04/09/2018   Pain in unspecified knee 12/07/2020   Pain of finger 04/09/2018   Palpitations    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[facility]05/05/2021   Personal history of colonic polyps 12/07/2020   Pneumonia    Pneumothorax 04/12/2020   Polypharmacy 04/09/2018   Porokeratosis 01/13/2019   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[facility]  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[place]) 12/07/2020   Pulmonary nodules 06/08/2016   Pure hypercholesterolemia 12/07/2020   Renal cyst 12/07/2020   Sleep disorder 04/08/2018   Status post bronchoscopy 04/12/2020   Tear of rotator cuff 04/09/2018   Tobacco user [id number]   Uncomplicated alcohol dependence ([place]) 04/09/2018   Unspecified rotator cuff tear or rupture of unspecified shoulder, not specified as traumatic 12/07/2020   Unspecified tear of unspecified meniscus, current injury, unspecified knee, initial encounter 12/07/2020   Visual disturbance 12/07/2020   Vitamin D deficiency 04/08/2018    Past Surgical History:  Procedure Laterality Date   BRONCHIAL BIOPSY  10/13/2019   Procedure: BRONCHIAL BIOPSIES;  Surgeon: Collene Gobble, MD;  Location: [facility];  Service: Pulmonary;;   BRONCHIAL BIOPSY  04/12/2020   Procedure: BRONCHIAL BIOPSIES;  Surgeon: Collene Gobble, MD;  Location: [facility] ENDOSCOPY;  Service: Pulmonary;;   BRONCHIAL BRUSHINGS  10/13/2019   Procedure: BRONCHIAL BRUSHINGS;  Surgeon: Collene Gobble, MD;  Location: [facility] ENDOSCOPY;  Service: Pulmonary;;   BRONCHIAL BRUSHINGS  04/12/2020   Procedure: BRONCHIAL BRUSHINGS;  Surgeon: Collene Gobble, MD;  Location: [facility]  ENDOSCOPY;  Service: Pulmonary;;   BRONCHIAL NEEDLE ASPIRATION BIOPSY  10/13/2019   Procedure: BRONCHIAL NEEDLE ASPIRATION BIOPSIES;  Surgeon: Collene Gobble, MD;  Location: [facility] ENDOSCOPY;  Service: Pulmonary;;   BRONCHIAL NEEDLE ASPIRATION BIOPSY  04/12/2020   Procedure: BRONCHIAL NEEDLE ASPIRATION BIOPSIES;  Surgeon: Collene Gobble, MD;  Location: [facility] ENDOSCOPY;  Service: Pulmonary;;   BRONCHIAL WASHINGS  10/13/2019   Procedure: BRONCHIAL WASHINGS;  Surgeon: Collene Gobble, MD;  Location: [facility] ENDOSCOPY;  Service: Pulmonary;;   BRONCHIAL WASHINGS  04/12/2020   Procedure: BRONCHIAL WASHINGS;  Surgeon: Collene Gobble, MD;  Location: [place] ENDOSCOPY;  Service: Pulmonary;;   CARDIAC CATHETERIZATION  2002   50% RCA   CARDIOVERSION N/A 09/17/2022   Procedure: CARDIOVERSION;  Surgeon: Wellington Hampshire, MD;  Location: [facility] ORS;  Service: Cardiovascular;  Laterality: N/A;   CATARACT EXTRACTION, BILATERAL     CORONARY STENT INTERVENTION N/A 04/15/2018   Procedure: CORONARY STENT INTERVENTION;  Surgeon: Jettie Booze, MD;  Location: [place] CV LAB;  Service: Cardiovascular;  Laterality: N/A;  om1   EYE SURGERY     KNEE ARTHROSCOPY     LEFT HEART CATH AND CORONARY ANGIOGRAPHY N/A 04/15/2018   Procedure: LEFT HEART CATH AND CORONARY ANGIOGRAPHY;  Surgeon: Jettie Booze, MD;  Location: [place] CV LAB;  Service: Cardiovascular;  Laterality: N/A;   MULTIPLE TOOTH EXTRACTIONS     TONSILLECTOMY     VIDEO BRONCHOSCOPY  04/12/2020   VIDEO BRONCHOSCOPY WITH ENDOBRONCHIAL NAVIGATION N/A 07/26/2016   Procedure: VIDEO BRONCHOSCOPY WITH ENDOBRONCHIAL NAVIGATION;  Surgeon: Collene Gobble, MD;  Location: [place];  Service: Thoracic;  Laterality: N/A;   VIDEO BRONCHOSCOPY WITH ENDOBRONCHIAL NAVIGATION N/A 10/13/2019   Procedure: [facility] AND VIDEO BRONCHOSCOPY WITH ENDOBRONCHIAL NAVIGATION;  Surgeon: Collene Gobble, MD;  Location: [place] ENDOSCOPY;  Service: Pulmonary;  Laterality: N/A;   VIDEO BRONCHOSCOPY  WITH ENDOBRONCHIAL NAVIGATION N/A 04/12/2020   Procedure: VIDEO BRONCHOSCOPY WITH ENDOBRONCHIAL NAVIGATION;  Surgeon: Collene Gobble, MD;  Location: 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  Tobacco Use   Smoking status: Former    Packs/day: 0.75    Years: 67.00    Total pack years: 50.25    Types: Cigarettes    Start date: 1954    Quit date: 07/13/2020    Years since quitting: 2.2    Passive exposure: Never   Smokeless tobacco: Never   Tobacco comments:    Recent Quit    Vaping Use   Vaping Use: Former  Substance and Sexual Activity   Alcohol use: No    Alcohol/week: 0.0 standard drinks of alcohol    Comment: quit drinking 2014   Drug use: No   Sexual activity: Not on file  Other Topics Concern   Not on file  Social History Narrative   Has living will   Wife is health care POA--alternate is son [name] [name])   Has daughter in [place]   Has DNR   No tube feeds if cognitively unaware   Social Determinants of Health   Financial Resource Strain: Not on file  Food Insecurity: No Food Insecurity (08/13/2022)   Hunger Vital Sign    Worried About Running Out of Food in the Last Year: Never true    [name] [name] in the Last Year: Never true  Transportation Needs: No Transportation Needs (08/13/2022)   PRAPARE - Hydrologist (Medical): No    Lack of Transportation (Non-Medical): No  Physical Activity: Not on file  Stress: Not on file  Social Connections: Not on file  Intimate Partner Violence: Not At Risk (08/13/2022)   Humiliation,  Afraid, Rape, and Kick questionnaire    Fear of Current or Ex-Partner: No    Emotionally Abused: No    Physically Abused: No    Sexually Abused: No   Review of Systems Eating fair Bowels okay now     Objective:   Physical Exam Cardiovascular:     Rate and Rhythm: Normal rate. Rhythm irregular.  Skin:    Comments: Interval improvement in left foot redness Now toes and forefoot redness blanches when elevated Miinmal warmth now Foot less swollen but some around the ankle Not as tender Ulcer on 2nd toe more granulated            Assessment & Plan:

## 2022-09-27 NOTE — Addendum Note (Signed)
Addended by: Pilar Grammes on: 09/27/2022 04:19 PM   Modules accepted: Orders

## 2022-09-27 NOTE — Assessment & Plan Note (Signed)
Further improvement Will give rocephin again today Continue doxy/levaquin Recheck tomorrow

## 2022-09-27 NOTE — Assessment & Plan Note (Signed)
On digoxin now Will check labs next week

## 2022-09-27 NOTE — Telephone Encounter (Signed)
Rx sent 

## 2022-09-28 ENCOUNTER — Encounter: Payer: Self-pay | Admitting: Internal Medicine

## 2022-09-28 ENCOUNTER — Ambulatory Visit (INDEPENDENT_AMBULATORY_CARE_PROVIDER_SITE_OTHER): Payer: Medicare Other | Admitting: Internal Medicine

## 2022-09-28 VITALS — BP 96/60 | HR 47 | Temp 96.9°F | Ht 70.5 in | Wt 156.0 lb

## 2022-09-28 DIAGNOSIS — L03116 Cellulitis of left lower limb: Secondary | ICD-10-CM | POA: Diagnosis not present

## 2022-09-28 DIAGNOSIS — I4819 Other persistent atrial fibrillation: Secondary | ICD-10-CM

## 2022-09-28 DIAGNOSIS — I2510 Atherosclerotic heart disease of native coronary artery without angina pectoris: Secondary | ICD-10-CM | POA: Diagnosis not present

## 2022-09-28 DIAGNOSIS — J441 Chronic obstructive pulmonary disease with (acute) exacerbation: Secondary | ICD-10-CM | POA: Diagnosis not present

## 2022-09-28 DIAGNOSIS — I4891 Unspecified atrial fibrillation: Secondary | ICD-10-CM | POA: Diagnosis not present

## 2022-09-28 DIAGNOSIS — R278 Other lack of coordination: Secondary | ICD-10-CM | POA: Diagnosis not present

## 2022-09-28 DIAGNOSIS — R2689 Other abnormalities of gait and mobility: Secondary | ICD-10-CM | POA: Diagnosis not present

## 2022-09-28 DIAGNOSIS — J431 Panlobular emphysema: Secondary | ICD-10-CM | POA: Diagnosis not present

## 2022-09-28 DIAGNOSIS — Z741 Need for assistance with personal care: Secondary | ICD-10-CM | POA: Diagnosis not present

## 2022-09-28 DIAGNOSIS — Z9981 Dependence on supplemental oxygen: Secondary | ICD-10-CM | POA: Diagnosis not present

## 2022-09-28 DIAGNOSIS — I4811 Longstanding persistent atrial fibrillation: Secondary | ICD-10-CM | POA: Diagnosis not present

## 2022-09-28 DIAGNOSIS — J9621 Acute and chronic respiratory failure with hypoxia: Secondary | ICD-10-CM | POA: Diagnosis not present

## 2022-09-28 DIAGNOSIS — M6281 Muscle weakness (generalized): Secondary | ICD-10-CM | POA: Diagnosis not present

## 2022-09-28 MED ORDER — CEFTRIAXONE SODIUM 1 G IJ SOLR
1.0000 g | Freq: Once | INTRAMUSCULAR | Status: AC
  Administered 2022-09-28: 1 g via INTRAMUSCULAR

## 2022-09-28 NOTE — Progress Notes (Signed)
Subjective:    Patient ID: [name] Kemps., male    DOB: 08-21-1940, 82 y.o.   MRN: [id number]  HPI Here for follow up of left foot cellulitis  Redness almost gone as of this morning It does turn red with having it down No discharge from the ulcer  No fever No problems with the medications  Current Outpatient Medications on File Prior to Visit  Medication Sig Dispense Refill   acetaminophen (TYLENOL) 325 MG tablet Take 650 mg by mouth every 4 (four) hours as needed.     acetaminophen-codeine (TYLENOL #3) 300-30 MG tablet Take 1 tablet by mouth every 4 (four) hours as needed for moderate pain. 30 tablet 0   albuterol (PROVENTIL) (2.5 MG/3ML) 0.083% nebulizer solution Take 2.5 mg by nebulization 2 (two) times daily.     albuterol (VENTOLIN HFA) 108 (90 Base) MCG/ACT inhaler Inhale 2 puffs into the lungs 3 (three) times daily as needed for wheezing or shortness of breath.     ammonium lactate (AMLACTIN) 12 % cream Apply topically at bedtime.     apixaban (ELIQUIS) 5 MG TABS tablet Take 1 tablet (5 mg total) by mouth 2 (two) times daily. 60 tablet 5   Budeson-Glycopyrrol-Formoterol (BREZTRI AEROSPHERE) 160-9-4.8 MCG/ACT AERO Inhale 2 puffs into the lungs 2 (two) times daily. 10.7 g 6   digoxin (LANOXIN) 0.125 MG tablet Take 1 tablet (0.125 mg total) by mouth daily. 90 tablet 3   diltiazem (CARDIZEM CD) 120 MG 24 hr capsule Take 1 capsule (120 mg total) by mouth 2 (two) times daily. 180 capsule 3   doxycycline (VIBRA-TABS) 100 MG tablet Take 1 tablet (100 mg total) by mouth 2 (two) times daily. 14 tablet 0   finasteride (PROSCAR) 5 MG tablet Take 1 tablet (5 mg total) by mouth daily. 90 tablet 3   furosemide (LASIX) 20 MG tablet Take one tablet (20 mg) by mouth daily with an addition tablet to equal (40 mg) every other day. 90 tablet 3   gabapentin (NEURONTIN) 100 MG capsule Take 100 mg by mouth 2 (two) times daily.     [employer] [facility] EX) Apply to sacrum daily and as  needed     ipratropium-albuterol (DUONEB) 0.5-2.5 (3) MG/3ML SOLN Take 3 mLs by nebulization every 6 (six) hours as needed. 360 mL 1   ketoconazole (NIZORAL) 2 % cream Apply topically 2 (two) times daily.     levofloxacin (LEVAQUIN) 500 MG tablet Take 1 tablet (500 mg total) by mouth daily. 7 tablet 0   Melatonin 5 MG CAPS Take 5 mg by mouth at bedtime.     nitroGLYCERIN (NITROSTAT) 0.4 MG SL tablet Place 1 tablet (0.4 mg total) under the tongue every 5 (five) minutes as needed for chest pain. 25 tablet 4   OXYGEN Inhale into the lungs. 2 liters upon exertion and 3 liters continuous at night.     pantoprazole (PROTONIX) 40 MG tablet Take 40 mg by mouth daily.     polyethylene glycol (MIRALAX / GLYCOLAX) 17 g packet Take 17 g by mouth as needed.     rosuvastatin (CRESTOR) 20 MG tablet Take 1 tablet (20 mg total) by mouth at bedtime. 90 tablet 2   silver sulfADIAZINE (SILVADENE) 1 % cream Apply 1 Application topically daily. 50 g 0   clonazePAM (KLONOPIN) 0.5 MG tablet Take 1 tablet (0.5 mg total) by mouth daily as needed for anxiety. 30 tablet 0   No current facility-administered medications on file prior to  visit.    Allergies  Allergen Reactions   Flagyl [Metronidazole] Other (See Comments)    Other reaction(s): skin reaction    Past Medical History:  Diagnosis Date   Abdominal discomfort 12/07/2020   Acquired trigger finger of right middle finger 02/04/2020   Acute prostatitis 04/08/2018   Alcohol abuse    Alcohol dependence ([place]) 12/07/2020   Anal or rectal pain 04/08/2018   Annual physical exam 12/07/2020   Anorectal disorder 12/07/2020   Arthritis    Atherosclerotic heart disease of native coronary artery without angina pectoris 04/08/2018   a. 03/2018 PCI: LM nl, LAD 86m LCX nl, OM1 70p (2.75x8 Synergy DES), 932m2.5x24 Synergy DES), RCA 25p, RPL1 50. EF 55-65%.   Atrial flutter ([place]   a. 07/2022 in setting of hospitalization for resp illness/RSV. CHA2DS2VASc = 4-->eliquis;  b. 08/2022 s/p DCCV (100J); c. 09/26/2022 Recurrent Aflutter.   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 02/2022 Carotid U/S: 1-39% bilat ICA stenoses.   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   Contracture of palmar fascia 12/07/2020   COPD (chronic obstructive pulmonary disease) (HCD[place]   Corn of toe 12/07/2020   Corns and callosities 04/09/2018   Coronary artery disease    2019 with stents   Coronary atherosclerosis 05/07/2018   Degenerative disc disease, 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   Degenerative disc disease, 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   Diarrhea 04/09/2018   Double vision with both eyes open 12/07/2020   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   Dysphagia 08/14/2018   Dyspnea    ED (erectile dysfunction)    ED (erectile dysfunction) of organic origin 12/07/2020   Elevated prostate specific antigen (PSA) 04/08/2018   Elevated PSA    Encounter for screening for other disorder 12/07/2020   Enlarged prostate without lower urinary tract symptoms (luts) 04/09/2018   Enthesopathy 12/07/2020   Essential (primary) hypertension 04/08/2018   Ex-smoker 04/09/2018   Exertional dyspnea 04/02/2018   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 07/2022 Echo: EF 65-70%, no rwma, mild LVH, nl RV fxn.   Hyperlipidemia    Hypertension     Hypoxemia 12/07/2020   Kidney cysts    Left knee pain 12/07/2020   Low back pain 04/08/2018   Male erectile dysfunction 04/09/2018   Mixed hyperlipidemia 04/08/2018   Nocturia more than twice per night 04/09/2018   Osteoarthritis of first carpometacarpal joint 04/08/2018   Osteoarthritis of knee 12/07/2020   Other long term (current) drug therapy 12/07/2020   Pain due to onychomycosis of toenail of left foot 07/20/2019   Pain in joint 04/09/2018   Pain in knee 04/09/2018   Pain in unspecified knee 12/07/2020   Pain of finger 04/09/2018   Palpitations    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HCWhitewater05/05/2021   Personal history of colonic polyps 12/07/2020   Pneumonia    Pneumothorax 04/12/2020   Polypharmacy 04/09/2018  Porokeratosis 01/13/2019   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[place])    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[place]) 12/07/2020   Pulmonary nodules 06/08/2016   Pure hypercholesterolemia 12/07/2020   Renal cyst 12/07/2020   Sleep disorder 04/08/2018   Status post bronchoscopy 04/12/2020   Tear of rotator cuff 04/09/2018   Tobacco user [id number]   Uncomplicated alcohol dependence ([place]) 04/09/2018   Unspecified rotator cuff tear or rupture of unspecified shoulder, not specified as traumatic 12/07/2020   Unspecified tear of unspecified meniscus, current injury, unspecified knee, initial encounter 12/07/2020   Visual disturbance 12/07/2020   Vitamin D deficiency 04/08/2018    Past Surgical History:  Procedure Laterality Date   BRONCHIAL BIOPSY  10/13/2019   Procedure: BRONCHIAL BIOPSIES;  Surgeon: Collene Gobble, MD;  Location: [place];  Service: Pulmonary;;   BRONCHIAL BIOPSY  04/12/2020   Procedure: BRONCHIAL BIOPSIES;  Surgeon: Collene Gobble, MD;  Location: [place];  Service: Pulmonary;;   BRONCHIAL BRUSHINGS  10/13/2019   Procedure: BRONCHIAL BRUSHINGS;  Surgeon: Collene Gobble, MD;  [facility] ENDOSCOPY;  Service: Pulmonary;;   BRONCHIAL BRUSHINGS  04/12/2020   Procedure: BRONCHIAL  BRUSHINGS;  Surgeon: Collene Gobble, MD;  Location: [facility] ENDOSCOPY;  Service: Pulmonary;;   BRONCHIAL NEEDLE ASPIRATION BIOPSY  10/13/2019   Procedure: BRONCHIAL NEEDLE ASPIRATION BIOPSIES;  Surgeon: Collene Gobble, MD;  Location: [facility] ENDOSCOPY;  Service: Pulmonary;;   BRONCHIAL NEEDLE ASPIRATION BIOPSY  04/12/2020   Procedure: BRONCHIAL NEEDLE ASPIRATION BIOPSIES;  Surgeon: Collene Gobble, MD;  Location: [facility] ENDOSCOPY;  Service: Pulmonary;;   BRONCHIAL WASHINGS  10/13/2019   Procedure: BRONCHIAL WASHINGS;  Surgeon: Collene Gobble, MD;  Location: [place];  Service: Pulmonary;;   BRONCHIAL WASHINGS  04/12/2020   Procedure: BRONCHIAL WASHINGS;  Surgeon: Collene Gobble, MD;  Location: [place] ENDOSCOPY;  Service: Pulmonary;;   CARDIAC CATHETERIZATION  2002   50% RCA   CARDIOVERSION N/A 09/17/2022   Procedure: CARDIOVERSION;  Surgeon: Wellington Hampshire, MD;  Location: [facility] ORS;  Service: Cardiovascular;  Laterality: N/A;   CATARACT EXTRACTION, BILATERAL     CORONARY STENT INTERVENTION N/A 04/15/2018   Procedure: CORONARY STENT INTERVENTION;  Surgeon: Jettie Booze, MD;  Location: [place] CV LAB;  Service: Cardiovascular;  Laterality: N/A;  om1   EYE SURGERY     KNEE ARTHROSCOPY     LEFT HEART CATH AND CORONARY ANGIOGRAPHY N/A 04/15/2018   Procedure: LEFT HEART CATH AND CORONARY ANGIOGRAPHY;  Surgeon: Jettie Booze, MD;  Location: [place] CV LAB;  Service: Cardiovascular;  Laterality: N/A;   MULTIPLE TOOTH EXTRACTIONS     TONSILLECTOMY     VIDEO BRONCHOSCOPY  04/12/2020   VIDEO BRONCHOSCOPY WITH ENDOBRONCHIAL NAVIGATION N/A 07/26/2016   Procedure: VIDEO BRONCHOSCOPY WITH ENDOBRONCHIAL NAVIGATION;  Surgeon: Collene Gobble, MD;  Location: [place];  Service: Thoracic;  Laterality: N/A;   VIDEO BRONCHOSCOPY WITH ENDOBRONCHIAL NAVIGATION N/A 10/13/2019   Procedure: [facility] AND VIDEO BRONCHOSCOPY WITH ENDOBRONCHIAL NAVIGATION;  Surgeon: Collene Gobble, MD;  Location: [place] ENDOSCOPY;   Service: Pulmonary;  Laterality: N/A;   VIDEO BRONCHOSCOPY WITH ENDOBRONCHIAL NAVIGATION N/A 04/12/2020   Procedure: VIDEO BRONCHOSCOPY WITH ENDOBRONCHIAL NAVIGATION;  Surgeon: Collene Gobble, MD;  Location: [place] ENDOSCOPY;  Service: Pulmonary;  Laterality: N/A;    Family History  Problem Relation Age of Onset   Hypertension Mother    Alzheimer's disease Mother     Social History   Socioeconomic History   Marital status: Married    Spouse name: Not on file  Number of children: 2   Years of education: Not on file   Highest education level: Not on file  Occupational History   Occupation: Personal assistant, insurance, 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  Tobacco Use   Smoking status: Former    Packs/day: 0.75    Years: 67.00    Total pack years: 50.25    Types: Cigarettes    Start date: 1954    Quit date: 07/13/2020    Years since quitting: 2.2    Passive exposure: Never   Smokeless tobacco: Never   Tobacco comments:    Recent Quit    Vaping Use   Vaping Use: Former  Substance and Sexual Activity   Alcohol use: No    Alcohol/week: 0.0 standard drinks of alcohol    Comment: quit drinking 2014   Drug use: No   Sexual activity: Not on file  Other Topics Concern   Not on file  Social History Narrative   Has living will   Wife is health care POA--alternate is son Buelah Manis)   Has daughter in [place]   Has DNR   No tube feeds if cognitively unaware   Social Determinants of Health   Financial Resource Strain: Not on file  Food Insecurity: No Food Insecurity (08/13/2022)   Hunger Vital Sign    Worried About Running Out of Food in the Last Year: Never true    [place] in the Last Year: Never true  Transportation Needs: No Transportation Needs (08/13/2022)   PRAPARE - Hydrologist (Medical): No    Lack of Transportation (Non-Medical): No  Physical Activity: Not on file  Stress: Not on file  Social Connections: Not on file  Intimate  Partner Violence: Not At Risk (08/13/2022)   Humiliation, Afraid, Rape, and Kick questionnaire    Fear of Current or Ex-Partner: No    Emotionally Abused: No    Physically Abused: No    Sexually Abused: No   Review of Systems Doesn't feel his heart Eating okay     Objective:   Physical Exam Cardiovascular:     Rate and Rhythm: Normal rate. Rhythm irregular.  Skin:    Comments: Still with dependent redness over metatarsal heads 2-5 on right With elevation if fades and blanches Not warm            Assessment & Plan:

## 2022-09-28 NOTE — Addendum Note (Signed)
Addended by: Pilar Grammes on: 09/28/2022 11:43 AM   Modules accepted: Orders

## 2022-09-28 NOTE — Assessment & Plan Note (Signed)
Now on digoxin Will check labs on Monday

## 2022-09-28 NOTE — Assessment & Plan Note (Signed)
Hard to judge with the dependent edema and redness---but the actual infection seems like it may be resolved Will give rocephin again Finish the doxy and 1 more week of levaquin Will have recheck on Monday with Romilda Garret NP--who looked at it today

## 2022-10-01 ENCOUNTER — Encounter: Payer: Self-pay | Admitting: Nurse Practitioner

## 2022-10-01 ENCOUNTER — Ambulatory Visit (INDEPENDENT_AMBULATORY_CARE_PROVIDER_SITE_OTHER): Payer: Medicare Other | Admitting: Nurse Practitioner

## 2022-10-01 VITALS — BP 98/64 | HR 50 | Temp 98.2°F | Resp 16 | Ht 70.5 in | Wt 156.0 lb

## 2022-10-01 DIAGNOSIS — I2510 Atherosclerotic heart disease of native coronary artery without angina pectoris: Secondary | ICD-10-CM | POA: Diagnosis not present

## 2022-10-01 DIAGNOSIS — Z9981 Dependence on supplemental oxygen: Secondary | ICD-10-CM | POA: Diagnosis not present

## 2022-10-01 DIAGNOSIS — Z741 Need for assistance with personal care: Secondary | ICD-10-CM | POA: Diagnosis not present

## 2022-10-01 DIAGNOSIS — J9621 Acute and chronic respiratory failure with hypoxia: Secondary | ICD-10-CM | POA: Diagnosis not present

## 2022-10-01 DIAGNOSIS — M6281 Muscle weakness (generalized): Secondary | ICD-10-CM | POA: Diagnosis not present

## 2022-10-01 DIAGNOSIS — R600 Localized edema: Secondary | ICD-10-CM | POA: Insufficient documentation

## 2022-10-01 DIAGNOSIS — J431 Panlobular emphysema: Secondary | ICD-10-CM | POA: Diagnosis not present

## 2022-10-01 DIAGNOSIS — I4819 Other persistent atrial fibrillation: Secondary | ICD-10-CM | POA: Diagnosis not present

## 2022-10-01 DIAGNOSIS — L03116 Cellulitis of left lower limb: Secondary | ICD-10-CM | POA: Diagnosis not present

## 2022-10-01 DIAGNOSIS — I4811 Longstanding persistent atrial fibrillation: Secondary | ICD-10-CM | POA: Diagnosis not present

## 2022-10-01 DIAGNOSIS — I739 Peripheral vascular disease, unspecified: Secondary | ICD-10-CM

## 2022-10-01 DIAGNOSIS — J441 Chronic obstructive pulmonary disease with (acute) exacerbation: Secondary | ICD-10-CM | POA: Diagnosis not present

## 2022-10-01 DIAGNOSIS — I4892 Unspecified atrial flutter: Secondary | ICD-10-CM

## 2022-10-01 DIAGNOSIS — R278 Other lack of coordination: Secondary | ICD-10-CM | POA: Diagnosis not present

## 2022-10-01 DIAGNOSIS — R2689 Other abnormalities of gait and mobility: Secondary | ICD-10-CM | POA: Diagnosis not present

## 2022-10-01 DIAGNOSIS — I4891 Unspecified atrial fibrillation: Secondary | ICD-10-CM | POA: Diagnosis not present

## 2022-10-01 LAB — RENAL FUNCTION PANEL
Albumin: 3.5 g/dL (ref 3.5–5.2)
BUN: 21 mg/dL (ref 6–23)
CO2: 35 mEq/L — ABNORMAL HIGH (ref 19–32)
Calcium: 9.2 mg/dL (ref 8.4–10.5)
Chloride: 97 mEq/L (ref 96–112)
Creatinine, Ser: 1.04 mg/dL (ref 0.40–1.50)
GFR: 67.33 mL/min (ref >60.00)
Glucose, Bld: 90 mg/dL (ref 70–99)
Phosphorus: 3.3 mg/dL (ref 2.3–4.6)
Potassium: 4.2 mEq/L (ref 3.5–5.1)
Sodium: 141 mEq/L (ref 135–145)

## 2022-10-01 LAB — TIQ-NTM

## 2022-10-01 NOTE — Progress Notes (Signed)
Acute Office Visit  Subjective:     Patient ID: [name] [name]., male    DOB: 14-Mar-1941, 82 y.o.   MRN: [id number]  Chief Complaint  Patient presents with   Cellulitis    Left foot    HPI Patient is in today for cellulitis  Patient was originally seen on 09/20/2022 by Viviana Simpler, MD sent to the emergency department admitted imaging to rule out osteomyelitis.  Patient was discharged patient was seen on 09/24/2022, 09/25/2022, 09/26/2022, 09/27/2022, 09/28/2022, for cellulitis rechecks. Patient was started on doxycycline and Levaquin prior to hospitalization.  Patient was given Rocephin injections on the dates listed above why completed the doxycycline and Levaquin.  He is here today for a follow-up   States that he feels like it has been doing good. States no swelling or redness this moving. States some edema and redness  States that they are still working on Levaquin and doxy   Review of Systems  Constitutional:  Negative for chills and fever.  Respiratory:  Positive for shortness of breath.   Cardiovascular:  Positive for leg swelling.  Gastrointestinal:  Positive for constipation.  Skin:        "+"wound        Objective:    BP 98/64   Pulse (!) 50   Temp 98.2 F (36.8 C)   Resp 16   Ht 5' 10.5" (1.791 m)   Wt 156 lb (70.8 kg)   SpO2 99%   BMI 22.07 kg/m  BP Readings from Last 3 Encounters:  10/01/22 98/64  09/28/22 96/60  09/27/22 90/60   Wt Readings from Last 3 Encounters:  10/01/22 156 lb (70.8 kg)  09/28/22 156 lb (70.8 kg)  09/27/22 158 lb (71.7 kg)      Physical Exam Vitals and nursing note reviewed.  Constitutional:      Appearance: Normal appearance.  Cardiovascular:     Rate and Rhythm: Normal rate.     Heart sounds: Normal heart sounds.  Pulmonary:     Effort: Pulmonary effort is normal.     Breath sounds: Normal breath sounds.  Musculoskeletal:     Right lower leg: Edema present.     Left lower leg: Edema present.  Skin:          Comments: Patient does have bilateral 2+ lower extremity edema.  Patient's left foot red in color and dependency.  When elevated at hip level color goes to patient is normal.  No warmth noticed on palpation.  No active signs of infection  Neurological:     Mental Status: He is alert.     No results found for any visits on 10/01/22.      Assessment & Plan:   Problem List Items Addressed This Visit       Cardiovascular and Mediastinum   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[place])    Patient has vascular disease.  Likely contributing factor to patient's cellulitis and ulcer on toe.  He is not followed by vascular currently.  He is followed by cardiology      Persistent atrial fibrillation [facility])    Patient was seen by cardiology and requested digoxin level checked was placed by primary care provider pending labs today        Musculoskeletal and Integument   Cellulitis of left foot - Primary    Resolving.  No signs of active infection today.  Patient will finish oral antibiotics defer Rocephin injection today.  Patient will keep foot elevated continue using Silvadene  cream on ulcer of second left toe and new skin tear region of the third left toe secondary to nail clipping.  Close follow-up with me in office later this week        Other   Lower extremity edema    Patient states when he wakes up no swelling swelling by the end of the day.  Vascular disease patient not followed by vascular currently.  He did question about being followed by such.  Will get him through cellulitis and defer to PCP       No orders of the defined types were placed in this encounter.   Return in about 3 days (around 10/04/2022) for Wound follow up with me .  Romilda Garret, NP

## 2022-10-01 NOTE — Patient Instructions (Signed)
Nice to see you today I want you to elevate your legs when you are able at home Continue using the silvadene cream on the second down and wrapping it daily Elizebeth Koller out the oral antibiotics that were prescribed to you  I want to see you on Thursday to make sure you are still heading in the right direction

## 2022-10-01 NOTE — Assessment & Plan Note (Signed)
Patient has vascular disease.  Likely contributing factor to patient's cellulitis and ulcer on toe.  He is not followed by vascular currently.  He is followed by cardiology

## 2022-10-01 NOTE — Assessment & Plan Note (Signed)
Patient states when he wakes up no swelling swelling by the end of the day.  Vascular disease patient not followed by vascular currently.  He did question about being followed by such.  Will get him through cellulitis and defer to PCP

## 2022-10-01 NOTE — Assessment & Plan Note (Signed)
Patient was seen by cardiology and requested digoxin level checked was placed by primary care provider pending labs today

## 2022-10-01 NOTE — Assessment & Plan Note (Signed)
Resolving.  No signs of active infection today.  Patient will finish oral antibiotics defer Rocephin injection today.  Patient will keep foot elevated continue using Silvadene cream on ulcer of second left toe and new skin tear region of the third left toe secondary to nail clipping.  Close follow-up with me in office later this week

## 2022-10-02 DIAGNOSIS — J431 Panlobular emphysema: Secondary | ICD-10-CM | POA: Diagnosis not present

## 2022-10-02 DIAGNOSIS — I2510 Atherosclerotic heart disease of native coronary artery without angina pectoris: Secondary | ICD-10-CM | POA: Diagnosis not present

## 2022-10-02 DIAGNOSIS — R278 Other lack of coordination: Secondary | ICD-10-CM | POA: Diagnosis not present

## 2022-10-02 DIAGNOSIS — Z9981 Dependence on supplemental oxygen: Secondary | ICD-10-CM | POA: Diagnosis not present

## 2022-10-02 DIAGNOSIS — J9621 Acute and chronic respiratory failure with hypoxia: Secondary | ICD-10-CM | POA: Diagnosis not present

## 2022-10-02 DIAGNOSIS — J441 Chronic obstructive pulmonary disease with (acute) exacerbation: Secondary | ICD-10-CM | POA: Diagnosis not present

## 2022-10-02 DIAGNOSIS — Z741 Need for assistance with personal care: Secondary | ICD-10-CM | POA: Diagnosis not present

## 2022-10-02 DIAGNOSIS — R2689 Other abnormalities of gait and mobility: Secondary | ICD-10-CM | POA: Diagnosis not present

## 2022-10-02 DIAGNOSIS — M6281 Muscle weakness (generalized): Secondary | ICD-10-CM | POA: Diagnosis not present

## 2022-10-02 DIAGNOSIS — I4891 Unspecified atrial fibrillation: Secondary | ICD-10-CM | POA: Diagnosis not present

## 2022-10-02 DIAGNOSIS — I4811 Longstanding persistent atrial fibrillation: Secondary | ICD-10-CM | POA: Diagnosis not present

## 2022-10-03 DIAGNOSIS — I2510 Atherosclerotic heart disease of native coronary artery without angina pectoris: Secondary | ICD-10-CM | POA: Diagnosis not present

## 2022-10-03 DIAGNOSIS — J441 Chronic obstructive pulmonary disease with (acute) exacerbation: Secondary | ICD-10-CM | POA: Diagnosis not present

## 2022-10-03 DIAGNOSIS — I4811 Longstanding persistent atrial fibrillation: Secondary | ICD-10-CM | POA: Diagnosis not present

## 2022-10-03 DIAGNOSIS — M6281 Muscle weakness (generalized): Secondary | ICD-10-CM | POA: Diagnosis not present

## 2022-10-03 DIAGNOSIS — R278 Other lack of coordination: Secondary | ICD-10-CM | POA: Diagnosis not present

## 2022-10-03 DIAGNOSIS — I4891 Unspecified atrial fibrillation: Secondary | ICD-10-CM | POA: Diagnosis not present

## 2022-10-03 DIAGNOSIS — J9621 Acute and chronic respiratory failure with hypoxia: Secondary | ICD-10-CM | POA: Diagnosis not present

## 2022-10-03 DIAGNOSIS — Z9981 Dependence on supplemental oxygen: Secondary | ICD-10-CM | POA: Diagnosis not present

## 2022-10-03 DIAGNOSIS — R2689 Other abnormalities of gait and mobility: Secondary | ICD-10-CM | POA: Diagnosis not present

## 2022-10-03 DIAGNOSIS — J431 Panlobular emphysema: Secondary | ICD-10-CM | POA: Diagnosis not present

## 2022-10-03 DIAGNOSIS — Z741 Need for assistance with personal care: Secondary | ICD-10-CM | POA: Diagnosis not present

## 2022-10-04 ENCOUNTER — Ambulatory Visit (INDEPENDENT_AMBULATORY_CARE_PROVIDER_SITE_OTHER): Payer: Medicare Other | Admitting: Nurse Practitioner

## 2022-10-04 ENCOUNTER — Other Ambulatory Visit: Payer: Self-pay | Admitting: Internal Medicine

## 2022-10-04 ENCOUNTER — Other Ambulatory Visit: Payer: Self-pay

## 2022-10-04 ENCOUNTER — Encounter: Payer: Self-pay | Admitting: Nurse Practitioner

## 2022-10-04 VITALS — BP 118/58 | HR 80 | Temp 99.2°F | Resp 16 | Ht 70.5 in | Wt 156.0 lb

## 2022-10-04 DIAGNOSIS — R278 Other lack of coordination: Secondary | ICD-10-CM | POA: Diagnosis not present

## 2022-10-04 DIAGNOSIS — L97521 Non-pressure chronic ulcer of other part of left foot limited to breakdown of skin: Secondary | ICD-10-CM

## 2022-10-04 DIAGNOSIS — I739 Peripheral vascular disease, unspecified: Secondary | ICD-10-CM | POA: Diagnosis not present

## 2022-10-04 DIAGNOSIS — M6281 Muscle weakness (generalized): Secondary | ICD-10-CM | POA: Diagnosis not present

## 2022-10-04 DIAGNOSIS — J431 Panlobular emphysema: Secondary | ICD-10-CM | POA: Diagnosis not present

## 2022-10-04 DIAGNOSIS — I2510 Atherosclerotic heart disease of native coronary artery without angina pectoris: Secondary | ICD-10-CM | POA: Diagnosis not present

## 2022-10-04 DIAGNOSIS — J441 Chronic obstructive pulmonary disease with (acute) exacerbation: Secondary | ICD-10-CM | POA: Diagnosis not present

## 2022-10-04 DIAGNOSIS — Z9981 Dependence on supplemental oxygen: Secondary | ICD-10-CM | POA: Diagnosis not present

## 2022-10-04 DIAGNOSIS — I4891 Unspecified atrial fibrillation: Secondary | ICD-10-CM | POA: Diagnosis not present

## 2022-10-04 DIAGNOSIS — R600 Localized edema: Secondary | ICD-10-CM | POA: Diagnosis not present

## 2022-10-04 DIAGNOSIS — J9621 Acute and chronic respiratory failure with hypoxia: Secondary | ICD-10-CM | POA: Diagnosis not present

## 2022-10-04 DIAGNOSIS — R2689 Other abnormalities of gait and mobility: Secondary | ICD-10-CM | POA: Diagnosis not present

## 2022-10-04 DIAGNOSIS — Z741 Need for assistance with personal care: Secondary | ICD-10-CM | POA: Diagnosis not present

## 2022-10-04 DIAGNOSIS — I4811 Longstanding persistent atrial fibrillation: Secondary | ICD-10-CM | POA: Diagnosis not present

## 2022-10-04 LAB — DIGOXIN LEVEL: Digoxin Level: 0.6 mcg/L — ABNORMAL LOW (ref 0.8–2.0)

## 2022-10-04 MED ORDER — CLONAZEPAM 0.5 MG PO TABS: 0.5000 mg | ORAL_TABLET | Freq: Every day | ORAL | 0 refills | Status: DC | PRN

## 2022-10-04 MED ORDER — FINASTERIDE 5 MG PO TABS: 5.0000 mg | ORAL_TABLET | Freq: Every day | ORAL | 3 refills | Status: DC

## 2022-10-04 NOTE — Assessment & Plan Note (Signed)
Ulcer is healing with granulation tissue.  Can using Silvadene cream and keeping covered daily.  Will follow-up with primary care in 1 week

## 2022-10-04 NOTE — Assessment & Plan Note (Signed)
Patient still complain right lower extremity edema.  He is wearing compression socks that are somewhat beneficial.  Encourage patient to keep legs elevated when he can at home.  Likely contributing factor to patient's ulcer also.  He does have an appointment with EP cardiology coming up

## 2022-10-04 NOTE — Patient Instructions (Addendum)
Nice to see you today The wound is healing Continue using the cream on the toe Follow up in 1 week with Dr Silvio Pate Increase you water intake by a glass

## 2022-10-04 NOTE — Telephone Encounter (Signed)
Pt in office wanting refills on medication.

## 2022-10-04 NOTE — Assessment & Plan Note (Signed)
Multifactorial.  Patient's last EF was within normal limits.  He has been followed by cardiology and is not followed by vascular.  Encouraged elevation and compression socks

## 2022-10-04 NOTE — Progress Notes (Signed)
Acute Office Visit  Subjective:     Patient ID: [name] [name]., male    DOB: 1941/03/07, 82 y.o.   MRN: [id number]  Chief Complaint  Patient presents with   Wound Check     Patient is in today for wound check   Patient concern for cellulitis of the left lower extremity.  Was sent to the emergency department with an MRI that ruled out osteomyelitis.  He was placed on doxycycline and Levaquin.  He was getting daily injections of Rocephin IM at office visit.  Patient's last office was with me on 10/01/2022.  Patient did not require Rocephin injection at juncture.  No signs of acute infection in it.  Either.  Patient did have lower extremity edema and already covered with dependency.  He is here today for follow-up check the ulcer to the second toe on the left foot along with making sure cellulitis has not returned. Patient is accompanied by wife.  States they are doing well.  They have still been using the Silvadene cream on the ulcer on the second toe.  Patient still complaining of bilateral lower extremity edema   Constipation: states that it has been a week that he has been dealing with this. States that he was up all night with cramping and having bowel movements.  He has tried senna Plus.  Patient states he is only drinking 1 glass of water a day along with juice and coffee but is caffeinated.  Patient does have a history of heart failure last EF was 65 to 70% patient is also been using MiraLAX but had diarrhea per patient and spouse's report Review of Systems  Constitutional:  Negative for chills and fever.  Respiratory:  Negative for shortness of breath.   Cardiovascular:  Negative for chest pain.  Gastrointestinal:  Positive for constipation. Negative for nausea and vomiting.        Objective:    BP (!) 118/58   Pulse 80   Temp 99.2 F (37.3 C)   Resp 16   Ht 5' 10.5" (1.791 m)   Wt 156 lb (70.8 kg)   SpO2 97%   BMI 22.07 kg/m    Physical Exam Vitals and nursing  note reviewed.  Constitutional:      Appearance: Normal appearance.  Cardiovascular:     Rate and Rhythm: Normal rate and regular rhythm.     Heart sounds: Normal heart sounds.  Pulmonary:     Comments: Decreased globally  Musculoskeletal:        General: Swelling present.     Right lower leg: Edema present.     Left lower leg: Edema present.  Skin:         Comments: 2+ left DP pulse  Neurological:     Mental Status: He is alert.     No results found for any visits on 10/04/22.      Assessment & Plan:   Problem List Items Addressed This Visit       Cardiovascular and Mediastinum   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[place])    Patient still complain right lower extremity edema.  He is wearing compression socks that are somewhat beneficial.  Encourage patient to keep legs elevated when he can at home.  Likely contributing factor to patient's ulcer also.  He does have an appointment with EP cardiology coming up        Other   Foot ulcer ([place]) - Primary    Ulcer is healing with granulation tissue.  Can using Silvadene cream and keeping covered daily.  Will follow-up with primary care in 1 week      Lower extremity edema    Multifactorial.  Patient's last EF was within normal limits.  He has been followed by cardiology and is not followed by vascular.  Encouraged elevation and compression socks       No orders of the defined types were placed in this encounter.   Return in about 1 week (around 10/11/2022) for Wound recheck with Dr. Silvio Pate .  Romilda Garret, NP

## 2022-10-05 DIAGNOSIS — M6281 Muscle weakness (generalized): Secondary | ICD-10-CM | POA: Diagnosis not present

## 2022-10-05 DIAGNOSIS — Z9981 Dependence on supplemental oxygen: Secondary | ICD-10-CM | POA: Diagnosis not present

## 2022-10-05 DIAGNOSIS — I4891 Unspecified atrial fibrillation: Secondary | ICD-10-CM | POA: Diagnosis not present

## 2022-10-05 DIAGNOSIS — R2689 Other abnormalities of gait and mobility: Secondary | ICD-10-CM | POA: Diagnosis not present

## 2022-10-05 DIAGNOSIS — J441 Chronic obstructive pulmonary disease with (acute) exacerbation: Secondary | ICD-10-CM | POA: Diagnosis not present

## 2022-10-05 DIAGNOSIS — I4811 Longstanding persistent atrial fibrillation: Secondary | ICD-10-CM | POA: Diagnosis not present

## 2022-10-05 DIAGNOSIS — I2510 Atherosclerotic heart disease of native coronary artery without angina pectoris: Secondary | ICD-10-CM | POA: Diagnosis not present

## 2022-10-05 DIAGNOSIS — J431 Panlobular emphysema: Secondary | ICD-10-CM | POA: Diagnosis not present

## 2022-10-05 DIAGNOSIS — Z741 Need for assistance with personal care: Secondary | ICD-10-CM | POA: Diagnosis not present

## 2022-10-05 DIAGNOSIS — J9621 Acute and chronic respiratory failure with hypoxia: Secondary | ICD-10-CM | POA: Diagnosis not present

## 2022-10-05 DIAGNOSIS — R278 Other lack of coordination: Secondary | ICD-10-CM | POA: Diagnosis not present

## 2022-10-08 DIAGNOSIS — J431 Panlobular emphysema: Secondary | ICD-10-CM | POA: Diagnosis not present

## 2022-10-08 DIAGNOSIS — R2689 Other abnormalities of gait and mobility: Secondary | ICD-10-CM | POA: Diagnosis not present

## 2022-10-08 DIAGNOSIS — R278 Other lack of coordination: Secondary | ICD-10-CM | POA: Diagnosis not present

## 2022-10-08 DIAGNOSIS — Z741 Need for assistance with personal care: Secondary | ICD-10-CM | POA: Diagnosis not present

## 2022-10-08 DIAGNOSIS — I2510 Atherosclerotic heart disease of native coronary artery without angina pectoris: Secondary | ICD-10-CM | POA: Diagnosis not present

## 2022-10-08 DIAGNOSIS — J9621 Acute and chronic respiratory failure with hypoxia: Secondary | ICD-10-CM | POA: Diagnosis not present

## 2022-10-08 DIAGNOSIS — J441 Chronic obstructive pulmonary disease with (acute) exacerbation: Secondary | ICD-10-CM | POA: Diagnosis not present

## 2022-10-08 DIAGNOSIS — Z9981 Dependence on supplemental oxygen: Secondary | ICD-10-CM | POA: Diagnosis not present

## 2022-10-08 DIAGNOSIS — I4811 Longstanding persistent atrial fibrillation: Secondary | ICD-10-CM | POA: Diagnosis not present

## 2022-10-08 DIAGNOSIS — M6281 Muscle weakness (generalized): Secondary | ICD-10-CM | POA: Diagnosis not present

## 2022-10-08 DIAGNOSIS — I4891 Unspecified atrial fibrillation: Secondary | ICD-10-CM | POA: Diagnosis not present

## 2022-10-09 DIAGNOSIS — R278 Other lack of coordination: Secondary | ICD-10-CM | POA: Diagnosis not present

## 2022-10-09 DIAGNOSIS — I4811 Longstanding persistent atrial fibrillation: Secondary | ICD-10-CM | POA: Diagnosis not present

## 2022-10-09 DIAGNOSIS — Z9981 Dependence on supplemental oxygen: Secondary | ICD-10-CM | POA: Diagnosis not present

## 2022-10-09 DIAGNOSIS — R2689 Other abnormalities of gait and mobility: Secondary | ICD-10-CM | POA: Diagnosis not present

## 2022-10-09 DIAGNOSIS — M6281 Muscle weakness (generalized): Secondary | ICD-10-CM | POA: Diagnosis not present

## 2022-10-09 DIAGNOSIS — I2510 Atherosclerotic heart disease of native coronary artery without angina pectoris: Secondary | ICD-10-CM | POA: Diagnosis not present

## 2022-10-09 DIAGNOSIS — Z741 Need for assistance with personal care: Secondary | ICD-10-CM | POA: Diagnosis not present

## 2022-10-09 DIAGNOSIS — J9621 Acute and chronic respiratory failure with hypoxia: Secondary | ICD-10-CM | POA: Diagnosis not present

## 2022-10-09 DIAGNOSIS — J431 Panlobular emphysema: Secondary | ICD-10-CM | POA: Diagnosis not present

## 2022-10-09 DIAGNOSIS — I4891 Unspecified atrial fibrillation: Secondary | ICD-10-CM | POA: Diagnosis not present

## 2022-10-09 DIAGNOSIS — J441 Chronic obstructive pulmonary disease with (acute) exacerbation: Secondary | ICD-10-CM | POA: Diagnosis not present

## 2022-10-09 NOTE — Progress Notes (Unsigned)
Electrophysiology Office Note:    Date:  10/10/2022   ID:  [name] [name]., DOB 1941-07-19, MRN [id number]  [place] Cardiologist:  Kathlyn Sacramento, MD  [facility] Electrophysiologist:  Vickie Epley, MD   Referring MD: [name], MD   Chief Complaint: Atrial flutter  History of Present Illness:    [name] [name]. is a 82 y.o. male who I am seeing today for an evaluation of atrial flutter at the request of [name] Loa, NP.  The patient has a history of coronary artery disease, COPD on home oxygen, pulmonary nodules, hypertension, hyperlipidemia, right subclavian stenosis, GERD, prior tobacco and alcohol abuse.  He is on Eliquis for CHA2DS2-VASc of 4.  He had a cardioversion in February but by the end of the month was back in recurrent atrial flutter.  His atrial flutter was diagnosed in January when he presented to [facility] with dyspnea.  He was found to be in a flutter with rapid ventricular rates.  Cardizem was started.  His EF was normal.  He is with his wife today in clinic.  He tells me yesterday he felt pretty good although was on oxygen as he is 24/7.  Today he is feeling worse with a worsened respiratory effort.  He reports an increase in mucus production.  No fever or chills.       Their past medical, social and family history was reveiwed.   ROS:   Please see the history of present illness.    All other systems reviewed and are negative.  EKGs/Labs/Other Studies Reviewed:    The following studies were reviewed today:     Physical Exam:    VS:  BP (!) 102/48   Pulse 81   Ht 5' 10.5" (1.791 m)   Wt 158 lb (71.7 kg)   SpO2 97%   BMI 22.35 kg/m     Wt Readings from Last 3 Encounters:  10/10/22 158 lb (71.7 kg)  10/04/22 156 lb (70.8 kg)  10/01/22 156 lb (70.8 kg)     GEN: Elderly.  In mild respiratory distress.  Tripoding on his wheelchair.  He has a very prolonged expiratory phase.  Audible wheezing.   CARDIAC:  Irregularly irregular, not tachycardic, no murmurs, rubs, gallops RESPIRATORY: Prolonged expiratory phase, wheezing scattered throughout both lungs.  Poor aeration to bilateral bases.      ASSESSMENT AND PLAN:    1. Typical atrial flutter ([place])   2. Acute on chronic diastolic heart failure ([place])    #COPD, acute Long discussion during today's appointment about his respiratory status.  He is in mild respiratory distress now although he says it is not as severe as it has been in the past.  It is certainly not an average day for him though he says.  He recently stopped antibiotics.  I will treat this as a COPD exacerbation.  He has an appoint with his primary care physician tomorrow.  I will start prednisone 40 mg by mouth this afternoon.  I also asked him to take a DuoNeb treatment when he gets home and this evening.  I also going to prescribe azithromycin.  I did discuss the situation with our pharmacist who recommended azithromycin based on his recent antibiotic use.  I advised him to report to the emergency department immediately should his respiratory status worsen.  #Atrial flutter Recurrent.  On chronic oxygen.  His respiratory status prevents safe ablation.  I discussed treatment options with the patient including amiodarone and  dofetilide.  I do not think he is a good candidate for amiodarone given his chronic oxygen use.  I have recommended loading with Tikosyn.  I would like this to be accomplished after his respiratory status is more stable.  I discussed the Tikosyn loading process in detail with the patient including the risks and need for inpatient hospitalization.  He is not very interested.  I will plan to see him back in about 3 months to see how he is doing and to revisit atrial flutter treatment strategies.    #Chronic diastolic heart failure More euvolemic today.  Required IV diuresis during recent hospitalization.      Signed, Hilton Cork. Quentin Ore, MD, [facility],  [facility] 10/10/2022 1:53 PM    Electrophysiology [facility]

## 2022-10-10 ENCOUNTER — Ambulatory Visit: Payer: Medicare Other | Attending: Cardiology | Admitting: Cardiology

## 2022-10-10 ENCOUNTER — Encounter: Payer: Self-pay | Admitting: Cardiology

## 2022-10-10 VITALS — BP 102/48 | HR 81 | Ht 70.5 in | Wt 158.0 lb

## 2022-10-10 DIAGNOSIS — I483 Typical atrial flutter: Secondary | ICD-10-CM

## 2022-10-10 DIAGNOSIS — I5033 Acute on chronic diastolic (congestive) heart failure: Secondary | ICD-10-CM | POA: Diagnosis not present

## 2022-10-10 MED ORDER — PREDNISONE 20 MG PO TABS: ORAL_TABLET | ORAL | 0 refills | Status: DC

## 2022-10-10 MED ORDER — AZITHROMYCIN 250 MG PO TABS: ORAL_TABLET | ORAL | 0 refills | Status: DC

## 2022-10-10 NOTE — Patient Instructions (Addendum)
Medication Instructions:  Your physician has recommended you make the following change in your medication:  1) START taking azithromycin 2 tablets (500 mg total) for one day, then 1 tablet (250 mg total) for four days.  2) START taking prednisone 40 mg daily for 3 days  *If you need a refill on your cardiac medications before your next appointment, please call your pharmacy*  Follow-Up: At [facility], you and your health needs are our priority.  As part of our continuing mission to provide you with exceptional heart care, we have created designated Provider Care Teams.  These Care Teams include your primary Cardiologist (physician) and Advanced Practice Providers (APPs -  Physician Assistants and Nurse Practitioners) who all work together to provide you with the care you need, when you need it.  Your next appointment:   3 month(s)  Provider:   You may see Vickie Epley, MD or one of the following Advanced Practice Providers on your designated Care Team:   Tommye Standard, [place] Beryle Beams" [place], [place], NP

## 2022-10-11 ENCOUNTER — Encounter: Payer: Self-pay | Admitting: Internal Medicine

## 2022-10-11 ENCOUNTER — Ambulatory Visit (INDEPENDENT_AMBULATORY_CARE_PROVIDER_SITE_OTHER): Payer: Medicare Other | Admitting: Internal Medicine

## 2022-10-11 VITALS — BP 118/78 | HR 73 | Temp 98.0°F | Ht 70.5 in | Wt 158.0 lb

## 2022-10-11 DIAGNOSIS — J441 Chronic obstructive pulmonary disease with (acute) exacerbation: Secondary | ICD-10-CM | POA: Diagnosis not present

## 2022-10-11 DIAGNOSIS — L97501 Non-pressure chronic ulcer of other part of unspecified foot limited to breakdown of skin: Secondary | ICD-10-CM | POA: Diagnosis not present

## 2022-10-11 NOTE — Progress Notes (Signed)
Subjective:    Patient ID: [name] Kemps., male    DOB: 09/03/1940, 82 y.o.   MRN: [id number]  HPI Here for follow up of edema/cellulitis ---and other medical issues  Foot is much better Still puffy and some redness Ulcers have granulated  Did see Dr Quentin Ore yesterday Not going taking action on the atrial flutter COPD worsened--given z-pak and prednisone '40mg'$  (for 3 days) Breathing is some better now--but still not great Not coughing Using the nebulizer--but hadn't been using it regularly  Pulse went up to 126 and oxygen briefly in the 80's after brief walk around the house  Current Outpatient Medications on File Prior to Visit  Medication Sig Dispense Refill   acetaminophen (TYLENOL) 325 MG tablet Take 650 mg by mouth every 4 (four) hours as needed.     acetaminophen-codeine (TYLENOL #3) 300-30 MG tablet Take 1 tablet by mouth every 4 (four) hours as needed for moderate pain. 30 tablet 0   albuterol (PROVENTIL) (2.5 MG/3ML) 0.083% nebulizer solution Take 2.5 mg by nebulization 2 (two) times daily.     albuterol (VENTOLIN HFA) 108 (90 Base) MCG/ACT inhaler Inhale 2 puffs into the lungs 3 (three) times daily as needed for wheezing or shortness of breath.     ammonium lactate (AMLACTIN) 12 % cream Apply topically at bedtime.     apixaban (ELIQUIS) 5 MG TABS tablet Take 1 tablet (5 mg total) by mouth 2 (two) times daily. 60 tablet 5   azithromycin (ZITHROMAX Z-PAK) 250 MG tablet Take 2 tablets (500 mg total) for one day. Then take 1 tablet (250 mg total) for 4 days. 6 each 0   Budeson-Glycopyrrol-Formoterol (BREZTRI AEROSPHERE) 160-9-4.8 MCG/ACT AERO Inhale 2 puffs into the lungs 2 (two) times daily. 10.7 g 6   clonazePAM (KLONOPIN) 0.5 MG tablet Take 1 tablet (0.5 mg total) by mouth daily as needed for anxiety. 30 tablet 0   digoxin (LANOXIN) 0.125 MG tablet Take 1 tablet (0.125 mg total) by mouth daily. 90 tablet 3   diltiazem (CARDIZEM CD) 120 MG 24 hr capsule Take 1 capsule  (120 mg total) by mouth 2 (two) times daily. 180 capsule 3   finasteride (PROSCAR) 5 MG tablet Take 1 tablet (5 mg total) by mouth daily. 90 tablet 3   furosemide (LASIX) 20 MG tablet Take one tablet (20 mg) by mouth daily with an addition tablet to equal (40 mg) every other day. 90 tablet 3   gabapentin (NEURONTIN) 100 MG capsule Take 100 mg by mouth 2 (two) times daily.     Infant Care Products [facility] EX) Apply to sacrum daily and as needed     ipratropium-albuterol (DUONEB) 0.5-2.5 (3) MG/3ML SOLN Take 3 mLs by nebulization every 6 (six) hours as needed. 360 mL 1   ketoconazole (NIZORAL) 2 % cream Apply topically 2 (two) times daily.     Melatonin 5 MG CAPS Take 5 mg by mouth at bedtime.     nitroGLYCERIN (NITROSTAT) 0.4 MG SL tablet Place 1 tablet (0.4 mg total) under the tongue every 5 (five) minutes as needed for chest pain. 25 tablet 4   OXYGEN Inhale into the lungs. 2 liters upon exertion and 3 liters continuous at night.     pantoprazole (PROTONIX) 40 MG tablet Take 40 mg by mouth daily.     polyethylene glycol (MIRALAX / GLYCOLAX) 17 g packet Take 17 g by mouth as needed.     predniSONE (DELTASONE) 20 MG tablet Take 2 tablets (40 mg  total) for 3 days 6 tablet 0   rosuvastatin (CRESTOR) 20 MG tablet Take 1 tablet (20 mg total) by mouth at bedtime. 90 tablet 2   silver sulfADIAZINE (SILVADENE) 1 % cream Apply 1 Application topically daily. 50 g 0   No current facility-administered medications on file prior to visit.    Allergies  Allergen Reactions   Flagyl [Metronidazole] Other (See Comments)    Other reaction(s): skin reaction    Past Medical History:  Diagnosis Date   Abdominal discomfort 12/07/2020   Acquired trigger finger of right middle finger 02/04/2020   Acute prostatitis 04/08/2018   Alcohol abuse    Alcohol dependence ([place]) 12/07/2020   Anal or rectal pain 04/08/2018   Annual physical exam 12/07/2020   Anorectal disorder 12/07/2020   Arthritis     Atherosclerotic heart disease of native coronary artery without angina pectoris 04/08/2018   a. 03/2018 PCI: LM nl, LAD 61m LCX nl, OM1 70p (2.75x8 Synergy DES), 962m2.5x24 Synergy DES), RCA 25p, RPL1 50. EF 55-65%.   Atrial flutter ([place]   a. 07/2022 in setting of hospitalization for resp illness/RSV. CHA2DS2VASc = 4-->eliquis; b. 08/2022 s/p DCCV (100J); c. 09/26/2022 Recurrent Aflutter.   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 02/2022 Carotid U/S: 1-39% bilat ICA stenoses.   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   Contracture of palmar fascia 12/07/2020   COPD (chronic obstructive pulmonary disease) (H[place]   Corn of toe 12/07/2020   Corns and callosities 04/09/2018   Coronary artery disease    2019 with stents   Coronary atherosclerosis 05/07/2018   Degenerative disc disease, 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   Degenerative disc disease, 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   Diarrhea 04/09/2018   Double vision with both eyes open 12/07/2020   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   Dysphagia 08/14/2018   Dyspnea    ED (erectile dysfunction)    ED (erectile dysfunction) of organic origin 12/07/2020   Elevated prostate specific antigen (PSA) 04/08/2018   Elevated PSA    Encounter for screening for other disorder 12/07/2020   Enlarged prostate without lower urinary tract symptoms (luts) 04/09/2018   Enthesopathy 12/07/2020   Essential (primary) hypertension 04/08/2018   Ex-smoker 04/09/2018   Exertional dyspnea 04/02/2018   Extrapyramidal and movement  disorder 04/09/2018   Flatulence 04/08/2018   Gastro-esophageal reflux disease without esophagitis 04/08/2018   GERD (gastroesophageal reflux disease)    History of acute otitis externa 08/14/2018   History of echocardiogram    a. 07/2022 Echo: EF 65-70%, no rwma, mild LVH, nl RV fxn.   Hyperlipidemia    Hypertension    Hypoxemia 12/07/2020   Kidney cysts    Left knee pain 12/07/2020   Low back pain 04/08/2018   Male erectile dysfunction 04/09/2018   Mixed hyperlipidemia 04/08/2018   Nocturia more than twice per night 04/09/2018   Osteoarthritis of first carpometacarpal joint 04/08/2018   Osteoarthritis of knee 12/07/2020   Other long term (current) drug therapy 12/07/2020   Pain due to onychomycosis of toenail of left foot 07/20/2019   Pain  in joint 04/09/2018   Pain in knee 04/09/2018   Pain in unspecified knee 12/07/2020   Pain of finger 04/09/2018   Palpitations    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[place]) 12/07/2020   Personal history of colonic polyps 12/07/2020   Pneumonia    Pneumothorax 04/12/2020   Polypharmacy 04/09/2018   Porokeratosis 01/13/2019   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[place])    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) 12/07/2020   Pulmonary nodules 06/08/2016   Pure hypercholesterolemia 12/07/2020   Renal cyst 12/07/2020   Sleep disorder 04/08/2018   Status post bronchoscopy 04/12/2020   Tear of rotator cuff 04/09/2018   Tobacco user [id number]   Uncomplicated alcohol dependence ([place]) 04/09/2018   Unspecified rotator cuff tear or rupture of unspecified shoulder, not specified as traumatic 12/07/2020   Unspecified tear of unspecified meniscus, current injury, unspecified knee, initial encounter 12/07/2020   Visual disturbance 12/07/2020   Vitamin D deficiency 04/08/2018    Past Surgical History:  Procedure Laterality Date   BRONCHIAL BIOPSY  10/13/2019   Procedure: BRONCHIAL BIOPSIES;  Surgeon: Collene Gobble, MD;  Location: [place];  Service: Pulmonary;;   BRONCHIAL BIOPSY   04/12/2020   Procedure: BRONCHIAL BIOPSIES;  Surgeon: Collene Gobble, MD;  Location: [place];  Service: Pulmonary;;   BRONCHIAL BRUSHINGS  10/13/2019   Procedure: BRONCHIAL BRUSHINGS;  Surgeon: Collene Gobble, MD;  Location: [facility] ENDOSCOPY;  Service: Pulmonary;;   BRONCHIAL BRUSHINGS  04/12/2020   Procedure: BRONCHIAL BRUSHINGS;  Surgeon: Collene Gobble, MD;  [facility] ENDOSCOPY;  Service: Pulmonary;;   BRONCHIAL NEEDLE ASPIRATION BIOPSY  10/13/2019   Procedure: BRONCHIAL NEEDLE ASPIRATION BIOPSIES;  Surgeon: Collene Gobble, MD;  Location: [facility] ENDOSCOPY;  Service: Pulmonary;;   BRONCHIAL NEEDLE ASPIRATION BIOPSY  04/12/2020   Procedure: BRONCHIAL NEEDLE ASPIRATION BIOPSIES;  Surgeon: Collene Gobble, MD;  Location: [facility] ENDOSCOPY;  Service: Pulmonary;;   BRONCHIAL WASHINGS  10/13/2019   Procedure: BRONCHIAL WASHINGS;  Surgeon: Collene Gobble, MD;  Location: [place];  Service: Pulmonary;;   BRONCHIAL WASHINGS  04/12/2020   Procedure: BRONCHIAL WASHINGS;  Surgeon: Collene Gobble, MD;  Location: [place];  Service: Pulmonary;;   CARDIAC CATHETERIZATION  2002   50% RCA   CARDIOVERSION N/A 09/17/2022   Procedure: CARDIOVERSION;  Surgeon: Wellington Hampshire, MD;  Location: [facility] ORS;  Service: Cardiovascular;  Laterality: N/A;   CATARACT EXTRACTION, BILATERAL     CORONARY STENT INTERVENTION N/A 04/15/2018   Procedure: CORONARY STENT INTERVENTION;  Surgeon: Jettie Booze, MD;  Location: [place] CV LAB;  Service: Cardiovascular;  Laterality: N/A;  om1   EYE SURGERY     KNEE ARTHROSCOPY     LEFT HEART CATH AND CORONARY ANGIOGRAPHY N/A 04/15/2018   Procedure: LEFT HEART CATH AND CORONARY ANGIOGRAPHY;  Surgeon: Jettie Booze, MD;  Location: [place] CV LAB;  Service: Cardiovascular;  Laterality: N/A;   MULTIPLE TOOTH EXTRACTIONS     TONSILLECTOMY     VIDEO BRONCHOSCOPY  04/12/2020   VIDEO BRONCHOSCOPY WITH ENDOBRONCHIAL NAVIGATION N/A 07/26/2016   Procedure: VIDEO  BRONCHOSCOPY WITH ENDOBRONCHIAL NAVIGATION;  Surgeon: Collene Gobble, MD;  Location: [place];  Service: Thoracic;  Laterality: N/A;   VIDEO BRONCHOSCOPY WITH ENDOBRONCHIAL NAVIGATION N/A 10/13/2019   Procedure: [facility] AND VIDEO BRONCHOSCOPY WITH ENDOBRONCHIAL NAVIGATION;  Surgeon: Collene Gobble, MD;  Location: [place] ENDOSCOPY;  Service: Pulmonary;  Laterality: N/A;   VIDEO BRONCHOSCOPY WITH ENDOBRONCHIAL NAVIGATION N/A 04/12/2020   Procedure: VIDEO BRONCHOSCOPY WITH ENDOBRONCHIAL NAVIGATION;  Surgeon: Collene Gobble, MD;  Location: [facility] ENDOSCOPY;  Service: Pulmonary;  Laterality: N/A;    Family History  Problem Relation Age of Onset   Hypertension Mother    Alzheimer's disease Mother     Social History   Socioeconomic History   Marital status: Married    Spouse name: Not on file   Number of children: 2   Years of education: Not on file   Highest education level: Not on file  Occupational History   Occupation: Personal assistant, Insurance underwriter, 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  Tobacco Use   Smoking status: Former    Packs/day: 0.75    Years: 67.00    Additional pack years: 0.00    Total pack years: 50.25    Types: Cigarettes    Start date: 49    Quit date: 07/13/2020    Years since quitting: 2.2    Passive exposure: Never   Smokeless tobacco: Never   Tobacco comments:    Recent Quit    Vaping Use   Vaping Use: Former  Substance and Sexual Activity   Alcohol use: No    Alcohol/week: 0.0 standard drinks of alcohol    Comment: quit drinking 2014   Drug use: No   Sexual activity: Not on file  Other Topics Concern   Not on file  Social History Narrative   Has living will   Wife is health care POA--alternate is son Buelah Manis)   Has daughter in [place]   Has DNR   No tube feeds if cognitively unaware   Social Determinants of Health   Financial Resource Strain: Not on file  Food Insecurity: No Food Insecurity (08/13/2022)   Hunger Vital Sign    Worried About Running Out of  Food in the Last Year: Never true    [place] in the Last Year: Never true  Transportation Needs: No Transportation Needs (08/13/2022)   PRAPARE - Hydrologist (Medical): No    Lack of Transportation (Non-Medical): No  Physical Activity: Not on file  Stress: Not on file  Social Connections: Not on file  Intimate Partner Violence: Not At Risk (08/13/2022)   Humiliation, Afraid, Rape, and Kick questionnaire    Fear of Current or Ex-Partner: No    Emotionally Abused: No    Physically Abused: No    Sexually Abused: No   Review of Systems Appetite is not great-does eat okay Still doing PT/OT at health center when he can (or the exercise room) Uses scooter to get around    Objective:   Physical Exam Constitutional:      Appearance: Normal appearance.  Cardiovascular:     Rate and Rhythm: Normal rate and regular rhythm.     Heart sounds:     No gallop.  Pulmonary:     Effort: Pulmonary effort is normal.     Comments: Little air movement but clear Musculoskeletal:     Comments: Feet are puffy but no pitting  Skin:    Comments: Dependent rubor in left toes Ulcer toes is granulated  Neurological:     Mental Status: He is alert.            Assessment & Plan:

## 2022-10-11 NOTE — Assessment & Plan Note (Signed)
Improved with the prednisone burst and zpak Use the duoneb tid while symptoms are prominent Will have him resume the '10mg'$  prednisone after the '40mg'$ 

## 2022-10-11 NOTE — Assessment & Plan Note (Signed)
Now granulated Can stop the silvadene

## 2022-10-12 ENCOUNTER — Encounter: Payer: Self-pay | Admitting: Emergency Medicine

## 2022-10-12 DIAGNOSIS — J441 Chronic obstructive pulmonary disease with (acute) exacerbation: Secondary | ICD-10-CM | POA: Diagnosis not present

## 2022-10-12 DIAGNOSIS — I4891 Unspecified atrial fibrillation: Secondary | ICD-10-CM | POA: Diagnosis not present

## 2022-10-12 DIAGNOSIS — M6281 Muscle weakness (generalized): Secondary | ICD-10-CM | POA: Diagnosis not present

## 2022-10-12 DIAGNOSIS — J431 Panlobular emphysema: Secondary | ICD-10-CM | POA: Diagnosis not present

## 2022-10-12 DIAGNOSIS — R2689 Other abnormalities of gait and mobility: Secondary | ICD-10-CM | POA: Diagnosis not present

## 2022-10-12 DIAGNOSIS — Z9981 Dependence on supplemental oxygen: Secondary | ICD-10-CM | POA: Diagnosis not present

## 2022-10-12 DIAGNOSIS — I2510 Atherosclerotic heart disease of native coronary artery without angina pectoris: Secondary | ICD-10-CM | POA: Diagnosis not present

## 2022-10-12 DIAGNOSIS — Z741 Need for assistance with personal care: Secondary | ICD-10-CM | POA: Diagnosis not present

## 2022-10-12 DIAGNOSIS — R278 Other lack of coordination: Secondary | ICD-10-CM | POA: Diagnosis not present

## 2022-10-12 DIAGNOSIS — I4811 Longstanding persistent atrial fibrillation: Secondary | ICD-10-CM | POA: Diagnosis not present

## 2022-10-12 DIAGNOSIS — J9621 Acute and chronic respiratory failure with hypoxia: Secondary | ICD-10-CM | POA: Diagnosis not present

## 2022-10-15 DIAGNOSIS — J449 Chronic obstructive pulmonary disease, unspecified: Secondary | ICD-10-CM | POA: Diagnosis not present

## 2022-10-15 DIAGNOSIS — J9611 Chronic respiratory failure with hypoxia: Secondary | ICD-10-CM | POA: Diagnosis not present

## 2022-10-15 NOTE — Telephone Encounter (Signed)
Okay for him to increase his DuoNeb to 3 times a day on a schedule.  He will need to watch closely to make sure that this does not cause him to have increased tachycardia (1 of its known side effects).

## 2022-10-16 DIAGNOSIS — R278 Other lack of coordination: Secondary | ICD-10-CM | POA: Diagnosis not present

## 2022-10-16 DIAGNOSIS — M6281 Muscle weakness (generalized): Secondary | ICD-10-CM | POA: Diagnosis not present

## 2022-10-16 DIAGNOSIS — J441 Chronic obstructive pulmonary disease with (acute) exacerbation: Secondary | ICD-10-CM | POA: Diagnosis not present

## 2022-10-16 DIAGNOSIS — I2510 Atherosclerotic heart disease of native coronary artery without angina pectoris: Secondary | ICD-10-CM | POA: Diagnosis not present

## 2022-10-16 DIAGNOSIS — I4891 Unspecified atrial fibrillation: Secondary | ICD-10-CM | POA: Diagnosis not present

## 2022-10-16 DIAGNOSIS — Z741 Need for assistance with personal care: Secondary | ICD-10-CM | POA: Diagnosis not present

## 2022-10-16 DIAGNOSIS — J9621 Acute and chronic respiratory failure with hypoxia: Secondary | ICD-10-CM | POA: Diagnosis not present

## 2022-10-16 DIAGNOSIS — Z9981 Dependence on supplemental oxygen: Secondary | ICD-10-CM | POA: Diagnosis not present

## 2022-10-16 DIAGNOSIS — I4811 Longstanding persistent atrial fibrillation: Secondary | ICD-10-CM | POA: Diagnosis not present

## 2022-10-16 DIAGNOSIS — J431 Panlobular emphysema: Secondary | ICD-10-CM | POA: Diagnosis not present

## 2022-10-16 DIAGNOSIS — R2689 Other abnormalities of gait and mobility: Secondary | ICD-10-CM | POA: Diagnosis not present

## 2022-10-17 DIAGNOSIS — I4811 Longstanding persistent atrial fibrillation: Secondary | ICD-10-CM | POA: Diagnosis not present

## 2022-10-17 DIAGNOSIS — J431 Panlobular emphysema: Secondary | ICD-10-CM | POA: Diagnosis not present

## 2022-10-17 DIAGNOSIS — Z9981 Dependence on supplemental oxygen: Secondary | ICD-10-CM | POA: Diagnosis not present

## 2022-10-17 DIAGNOSIS — Z741 Need for assistance with personal care: Secondary | ICD-10-CM | POA: Diagnosis not present

## 2022-10-17 DIAGNOSIS — R2689 Other abnormalities of gait and mobility: Secondary | ICD-10-CM | POA: Diagnosis not present

## 2022-10-17 DIAGNOSIS — J441 Chronic obstructive pulmonary disease with (acute) exacerbation: Secondary | ICD-10-CM | POA: Diagnosis not present

## 2022-10-17 DIAGNOSIS — I2510 Atherosclerotic heart disease of native coronary artery without angina pectoris: Secondary | ICD-10-CM | POA: Diagnosis not present

## 2022-10-17 DIAGNOSIS — I4891 Unspecified atrial fibrillation: Secondary | ICD-10-CM | POA: Diagnosis not present

## 2022-10-17 DIAGNOSIS — R278 Other lack of coordination: Secondary | ICD-10-CM | POA: Diagnosis not present

## 2022-10-17 DIAGNOSIS — M6281 Muscle weakness (generalized): Secondary | ICD-10-CM | POA: Diagnosis not present

## 2022-10-17 DIAGNOSIS — J9621 Acute and chronic respiratory failure with hypoxia: Secondary | ICD-10-CM | POA: Diagnosis not present

## 2022-10-18 DIAGNOSIS — R278 Other lack of coordination: Secondary | ICD-10-CM | POA: Diagnosis not present

## 2022-10-18 DIAGNOSIS — I2510 Atherosclerotic heart disease of native coronary artery without angina pectoris: Secondary | ICD-10-CM | POA: Diagnosis not present

## 2022-10-18 DIAGNOSIS — M6281 Muscle weakness (generalized): Secondary | ICD-10-CM | POA: Diagnosis not present

## 2022-10-18 DIAGNOSIS — Z9981 Dependence on supplemental oxygen: Secondary | ICD-10-CM | POA: Diagnosis not present

## 2022-10-18 DIAGNOSIS — I4891 Unspecified atrial fibrillation: Secondary | ICD-10-CM | POA: Diagnosis not present

## 2022-10-18 DIAGNOSIS — J431 Panlobular emphysema: Secondary | ICD-10-CM | POA: Diagnosis not present

## 2022-10-18 DIAGNOSIS — R2689 Other abnormalities of gait and mobility: Secondary | ICD-10-CM | POA: Diagnosis not present

## 2022-10-18 DIAGNOSIS — M5136 Other intervertebral disc degeneration, lumbar region: Secondary | ICD-10-CM | POA: Diagnosis not present

## 2022-10-18 DIAGNOSIS — I4811 Longstanding persistent atrial fibrillation: Secondary | ICD-10-CM | POA: Diagnosis not present

## 2022-10-18 DIAGNOSIS — M5030 Other cervical disc degeneration, unspecified cervical region: Secondary | ICD-10-CM | POA: Diagnosis not present

## 2022-10-18 DIAGNOSIS — Z741 Need for assistance with personal care: Secondary | ICD-10-CM | POA: Diagnosis not present

## 2022-10-18 DIAGNOSIS — J9621 Acute and chronic respiratory failure with hypoxia: Secondary | ICD-10-CM | POA: Diagnosis not present

## 2022-10-18 DIAGNOSIS — J441 Chronic obstructive pulmonary disease with (acute) exacerbation: Secondary | ICD-10-CM | POA: Diagnosis not present

## 2022-10-19 ENCOUNTER — Ambulatory Visit: Payer: Medicare Other | Admitting: Emergency Medicine

## 2022-10-19 ENCOUNTER — Encounter: Payer: Self-pay | Admitting: Emergency Medicine

## 2022-10-19 VITALS — BP 110/52 | HR 80 | Temp 97.5°F | Ht 70.5 in | Wt 160.6 lb

## 2022-10-19 DIAGNOSIS — R2689 Other abnormalities of gait and mobility: Secondary | ICD-10-CM | POA: Diagnosis not present

## 2022-10-19 DIAGNOSIS — Z9981 Dependence on supplemental oxygen: Secondary | ICD-10-CM | POA: Diagnosis not present

## 2022-10-19 DIAGNOSIS — J9621 Acute and chronic respiratory failure with hypoxia: Secondary | ICD-10-CM | POA: Diagnosis not present

## 2022-10-19 DIAGNOSIS — R918 Other nonspecific abnormal finding of lung field: Secondary | ICD-10-CM | POA: Diagnosis not present

## 2022-10-19 DIAGNOSIS — J9611 Chronic respiratory failure with hypoxia: Secondary | ICD-10-CM | POA: Diagnosis not present

## 2022-10-19 DIAGNOSIS — J431 Panlobular emphysema: Secondary | ICD-10-CM | POA: Diagnosis not present

## 2022-10-19 DIAGNOSIS — R278 Other lack of coordination: Secondary | ICD-10-CM | POA: Diagnosis not present

## 2022-10-19 DIAGNOSIS — I4892 Unspecified atrial flutter: Secondary | ICD-10-CM

## 2022-10-19 DIAGNOSIS — J300 Vasomotor rhinitis: Secondary | ICD-10-CM | POA: Insufficient documentation

## 2022-10-19 DIAGNOSIS — I2510 Atherosclerotic heart disease of native coronary artery without angina pectoris: Secondary | ICD-10-CM | POA: Diagnosis not present

## 2022-10-19 DIAGNOSIS — I4811 Longstanding persistent atrial fibrillation: Secondary | ICD-10-CM | POA: Diagnosis not present

## 2022-10-19 DIAGNOSIS — M6281 Muscle weakness (generalized): Secondary | ICD-10-CM | POA: Diagnosis not present

## 2022-10-19 DIAGNOSIS — I4891 Unspecified atrial fibrillation: Secondary | ICD-10-CM | POA: Diagnosis not present

## 2022-10-19 DIAGNOSIS — Z741 Need for assistance with personal care: Secondary | ICD-10-CM | POA: Diagnosis not present

## 2022-10-19 DIAGNOSIS — J441 Chronic obstructive pulmonary disease with (acute) exacerbation: Secondary | ICD-10-CM | POA: Diagnosis not present

## 2022-10-19 MED ORDER — IPRATROPIUM BROMIDE 0.03 % NA SOLN: 2.0000 | Freq: Two times a day (BID) | NASAL | 12 refills | Status: DC

## 2022-10-19 NOTE — Assessment & Plan Note (Signed)
Please continue your Breztri 2 puffs twice a day.  Rinse and gargle after using. Keep your DuoNeb available to use 1 nebulizer treatment up to every 6 hours if needed for shortness of breath, chest tightness, wheezing.  We will refill this prescription to make sure you have enough medication to use at least 2 times a day Keep your albuterol inhaler available to use 2 puffs when needed for shortness of breath, chest tightness, wheezing. Continue prednisone 10 mg once daily

## 2022-10-19 NOTE — Assessment & Plan Note (Signed)
His CT scan of the chest for surveillance of his nodules was deferred because he was hospitalized.  Will go ahead and get this scheduled to evaluate for interval stability.  He has had biopsies on more than one occasion, pathology suggestive of granulomatous disease.

## 2022-10-19 NOTE — Assessment & Plan Note (Signed)
Using DuoNeb as needed.  He had been advised to consider doing this 3 times a day on a schedule.  If he needs it that frequently than this would be acceptable.  I have asked him to only use as needed however because I want to avoid the associated tachycardia or any impact of control of his a flutter

## 2022-10-19 NOTE — Assessment & Plan Note (Signed)
Continue to use your oxygen at 2-4 L/min depending on your level of activity.  Continue sleep with 3 L/min.

## 2022-10-19 NOTE — Assessment & Plan Note (Signed)
Okay to use ipratropium nasal spray, 2 sprays each nostril 2-3 times daily as needed for nasal congestion

## 2022-10-19 NOTE — Progress Notes (Signed)
Subjective:    Patient ID: [name] Kemps., male    DOB: 12-17-1940, 82 y.o.   MRN: [id number]  COPD He complains of shortness of breath. There is no cough or wheezing. Pertinent negatives include no ear pain, fever, headaches, postnasal drip, rhinorrhea, sneezing, sore throat or trouble swallowing. His past medical history is significant for COPD.   ROV 04/19/22 --follow-up visit for 82 year old man with a history of severe obstructive lung disease and associated chronic hypoxemic respiratory failure.  I have followed him for pulmonary nodular disease that has prompted serial CT scans and bronchoscopies with nodular biopsies.  We have never been able to establish malignancy but there has been some evidence of possible granulomatous inflammation.  He has had a negative HSP panel. He he is on oxygen at 2-4 L/min depending on his degree of activity.  Maintained on Breztri and uses Combivent as needed. He reports more exertional SOB over the last 3 weeks, but he has continued to be able to go to the gym. He is on prednisone 5mg  qd. Has experienced some increased wheeze as well.   Addendum added 04/24/22 >> more detail on the patient's sx: Coughing more frequently particularly this week. This followed by bouts of sneezing. Nose runs excessively in morning and again in evening usually following eating. Evidence of blood in left nostril when blowing nose. Mucus is becoming more of a factor. Difficulty in clearing. Tightness of chest when SOB. Legs, ankles, feet swell on daily basis. Some relief overnight in legs and ankles, but not feet. Skin bruises and cuts easily. Skin peels off. Excessive bleeding probably due to Plavix and Prednisone. Takes 2 + months for a leg cut to heal  CT chest performed 03/07/2022 reviewed by me shows waxing and waning pulmonary nodular disease.  There is been a decrease in size of right lower lobe superior segmental nodule now 11 mm, stable scattered other nodules.  There are  new nodules present as well including a 6 mm rounded noncalcified right upper lobe nodule, 7 mm basilar right middle lobe nodule   ROV 10/19/22 --this is a follow-up visit for 82 year old gentleman with severe obstructive lung disease, associated hypoxemic respiratory failure.  I have followed him for this as well as pulmonary nodular disease for which he has undergone bronchoscopies.  Pathology has shown possible granulomatous disease, no evidence of malignancy.  HSP panel negative.  He is on PPI On chronic prednisone 10 mg daily.  Managed on Breztri, has DuoNeb available to use as needed, albuterol HFA.  Since I last saw him he has been hospitalized in January for RSV bronchitis and an acute exacerbation of COPD, atrial fibrillation with RVR, mild volume overload.  He required BiPAP during that hospitalization.  He then underwent cardioversion on 09/17/2022 > back in A Fib.  Today he reports that he was off of his pred for a while after his hospitalization, was started back on 10/10/22. He is also being rx w abx for L foot infection, now completed. He is doing PT/ rehab. His O2 is at 2-4L/min, sleeps w 3L/min. Breztri. He has staretd using his DuoNeb  Echocardiogram 08/06/2022 showed normal LV function 65-70%, normal RV size and function without any valvular disease.                          Review of Systems  Constitutional:  Negative for fever and unexpected weight change.  HENT:  Negative for congestion, dental problem,  ear pain, nosebleeds, postnasal drip, rhinorrhea, sinus pressure, sneezing, sore throat and trouble swallowing.   Eyes:  Negative for redness and itching.  Respiratory:  Positive for shortness of breath. Negative for cough, chest tightness and wheezing.   Cardiovascular:  Negative for palpitations and leg swelling.  Gastrointestinal:  Negative for nausea and vomiting.  Genitourinary:  Negative for dysuria.  Musculoskeletal:  Negative for joint swelling.  Skin:  Negative for rash.   Neurological:  Negative for headaches.  Hematological:  Does not bruise/bleed easily.  Psychiatric/Behavioral:  Negative for dysphoric mood. The patient is not nervous/anxious.        Objective:   Physical Exam Vitals:   10/19/22 1509  BP: (!) 110/52  Pulse: 80  Temp: (!) 97.5 F (36.4 C)  TempSrc: Oral  SpO2: 100%  Weight: 160 lb 9.6 oz (72.8 kg)  Height: 5' 10.5" (1.791 m)     Gen: Pleasant, well-nourished, in no distress,  normal affect  ENT: No lesions,  mouth clear,  oropharynx clear, no postnasal drip  Neck: No JVD, no upper airway noise  Lungs: No use of accessory muscles, distant, end expiratory wheeze especially on forced expiration  Cardiovascular: RRR, heart sounds normal, no murmur or gallops, 1-2+ bilateral LE edema   Musculoskeletal: No deformities, no cyanosis or clubbing  Neuro: alert, non focal  Skin: Warm, no lesions or rash, multiple ecchymoses      Assessment & Plan:  Atrial flutter ([place]) Using DuoNeb as needed.  He had been advised to consider doing this 3 times a day on a schedule.  If he needs it that frequently than this would be acceptable.  I have asked him to only use as needed however because I want to avoid the associated tachycardia or any impact of control of his a flutter  Pulmonary nodules His CT scan of the chest for surveillance of his nodules was deferred because he was hospitalized.  Will go ahead and get this scheduled to evaluate for interval stability.  He has had biopsies on more than one occasion, pathology suggestive of granulomatous disease.  COPD (chronic obstructive pulmonary disease) ([place]) Please continue your Breztri 2 puffs twice a day.  Rinse and gargle after using. Keep your DuoNeb available to use 1 nebulizer treatment up to every 6 hours if needed for shortness of breath, chest tightness, wheezing.  We will refill this prescription to make sure you have enough medication to use at least 2 times a day Keep your  albuterol inhaler available to use 2 puffs when needed for shortness of breath, chest tightness, wheezing. Continue prednisone 10 mg once daily  Chronic respiratory failure with hypoxia (HCC) Continue to use your oxygen at 2-4 L/min depending on your level of activity.  Continue sleep with 3 L/min.  Vasomotor rhinitis Okay to use ipratropium nasal spray, 2 sprays each nostril 2-3 times daily as needed for nasal congestion  Time spent 41 minutes  Baltazar Apo, MD, PhD 10/19/2022, 4:30 PM [facility] [phone] or if no answer [phone]

## 2022-10-19 NOTE — Patient Instructions (Addendum)
Please continue your Breztri 2 puffs twice a day.  Rinse and gargle after using. Keep your DuoNeb available to use 1 nebulizer treatment up to every 6 hours if needed for shortness of breath, chest tightness, wheezing.  We will refill this prescription to make sure you have enough medication to use at least 2 times a day Keep your albuterol inhaler available to use 2 puffs when needed for shortness of breath, chest tightness, wheezing. Continue prednisone 10 mg once daily Okay to use ipratropium nasal spray, 2 sprays each nostril 2-3 times daily as needed for nasal congestion Continue to use your oxygen at 2-4 L/min depending on your level of activity.  Continue sleep with 3 L/min. We will schedule your repeat CT scan of the chest, next available. Follow Dr. Lamonte Sakai next available after your CT scan so we can review those results.

## 2022-10-22 DIAGNOSIS — Z741 Need for assistance with personal care: Secondary | ICD-10-CM | POA: Diagnosis not present

## 2022-10-22 DIAGNOSIS — M6281 Muscle weakness (generalized): Secondary | ICD-10-CM | POA: Diagnosis not present

## 2022-10-22 DIAGNOSIS — I4811 Longstanding persistent atrial fibrillation: Secondary | ICD-10-CM | POA: Diagnosis not present

## 2022-10-22 DIAGNOSIS — J431 Panlobular emphysema: Secondary | ICD-10-CM | POA: Diagnosis not present

## 2022-10-22 DIAGNOSIS — R278 Other lack of coordination: Secondary | ICD-10-CM | POA: Diagnosis not present

## 2022-10-22 DIAGNOSIS — R2689 Other abnormalities of gait and mobility: Secondary | ICD-10-CM | POA: Diagnosis not present

## 2022-10-22 DIAGNOSIS — J9621 Acute and chronic respiratory failure with hypoxia: Secondary | ICD-10-CM | POA: Diagnosis not present

## 2022-10-22 DIAGNOSIS — I2510 Atherosclerotic heart disease of native coronary artery without angina pectoris: Secondary | ICD-10-CM | POA: Diagnosis not present

## 2022-10-22 DIAGNOSIS — I4891 Unspecified atrial fibrillation: Secondary | ICD-10-CM | POA: Diagnosis not present

## 2022-10-22 DIAGNOSIS — J441 Chronic obstructive pulmonary disease with (acute) exacerbation: Secondary | ICD-10-CM | POA: Diagnosis not present

## 2022-10-22 DIAGNOSIS — Z9981 Dependence on supplemental oxygen: Secondary | ICD-10-CM | POA: Diagnosis not present

## 2022-10-24 DIAGNOSIS — R2689 Other abnormalities of gait and mobility: Secondary | ICD-10-CM | POA: Diagnosis not present

## 2022-10-24 DIAGNOSIS — R278 Other lack of coordination: Secondary | ICD-10-CM | POA: Diagnosis not present

## 2022-10-24 DIAGNOSIS — I4811 Longstanding persistent atrial fibrillation: Secondary | ICD-10-CM | POA: Diagnosis not present

## 2022-10-24 DIAGNOSIS — M6281 Muscle weakness (generalized): Secondary | ICD-10-CM | POA: Diagnosis not present

## 2022-10-24 DIAGNOSIS — Z741 Need for assistance with personal care: Secondary | ICD-10-CM | POA: Diagnosis not present

## 2022-10-24 DIAGNOSIS — I4891 Unspecified atrial fibrillation: Secondary | ICD-10-CM | POA: Diagnosis not present

## 2022-10-24 DIAGNOSIS — J431 Panlobular emphysema: Secondary | ICD-10-CM | POA: Diagnosis not present

## 2022-10-24 DIAGNOSIS — Z9981 Dependence on supplemental oxygen: Secondary | ICD-10-CM | POA: Diagnosis not present

## 2022-10-24 DIAGNOSIS — I2510 Atherosclerotic heart disease of native coronary artery without angina pectoris: Secondary | ICD-10-CM | POA: Diagnosis not present

## 2022-10-24 DIAGNOSIS — J441 Chronic obstructive pulmonary disease with (acute) exacerbation: Secondary | ICD-10-CM | POA: Diagnosis not present

## 2022-10-24 DIAGNOSIS — J9621 Acute and chronic respiratory failure with hypoxia: Secondary | ICD-10-CM | POA: Diagnosis not present

## 2022-10-25 ENCOUNTER — Encounter: Payer: Self-pay | Admitting: Podiatry

## 2022-10-25 ENCOUNTER — Ambulatory Visit: Payer: Medicare Other | Admitting: Podiatry

## 2022-10-25 DIAGNOSIS — M205X2 Other deformities of toe(s) (acquired), left foot: Secondary | ICD-10-CM

## 2022-10-25 DIAGNOSIS — Q828 Other specified congenital malformations of skin: Secondary | ICD-10-CM

## 2022-10-25 DIAGNOSIS — I739 Peripheral vascular disease, unspecified: Secondary | ICD-10-CM | POA: Diagnosis not present

## 2022-10-25 NOTE — Progress Notes (Signed)
This patient returns to my office for at risk foot care.  This patient requires this care by a professional since this patient will be at risk due to having  pvd and coagulation defect. Patient is taking plavix. Patient develops a painful callus under left hallux.  This patient presents for at risk foot care today.  Patient says he has been treated for cellulitis left foot for the last few weeks and has been hospitalized    General Appearance  Alert, conversant and in no acute stress.  Vascular  Dorsalis pedis and posterior tibial  pulses are  weakly palpable  bilaterally.  Capillary return is within normal limits  bilaterally. Cold feet  bilaterally. Absent digital hair.  Neurologic  Senn-Weinstein monofilament wire test within normal limits  bilaterally. Muscle power within normal limits bilaterally.  Nails Normal nails  Noted  B/L.  No evidence of bacterial infection or drainage bilaterally.  Orthopedic  No limitations of motion  feet .  No crepitus or effusions noted.  No bony pathology or digital deformities noted.HAV  B/L.Marland Kitchen  Hallux extensus fused left hallux.  Skin  normotropic skin with no porokeratosis noted bilaterally.  No signs of infections or ulcers noted.   Callus plantar aspect left hallux.  Porokeratosis left foot    Consent was obtained for treatment procedures.  Debridement of callus with # 15 blade.  Discussed swelling with this patient.    Return office visit   prn                  Told patient to return for periodic foot care and evaluation due to potential at risk complications.   Gardiner Barefoot DPM

## 2022-10-26 DIAGNOSIS — I4891 Unspecified atrial fibrillation: Secondary | ICD-10-CM | POA: Diagnosis not present

## 2022-10-26 DIAGNOSIS — R278 Other lack of coordination: Secondary | ICD-10-CM | POA: Diagnosis not present

## 2022-10-26 DIAGNOSIS — M6281 Muscle weakness (generalized): Secondary | ICD-10-CM | POA: Diagnosis not present

## 2022-10-26 DIAGNOSIS — I2510 Atherosclerotic heart disease of native coronary artery without angina pectoris: Secondary | ICD-10-CM | POA: Diagnosis not present

## 2022-10-26 DIAGNOSIS — J9621 Acute and chronic respiratory failure with hypoxia: Secondary | ICD-10-CM | POA: Diagnosis not present

## 2022-10-26 DIAGNOSIS — I4811 Longstanding persistent atrial fibrillation: Secondary | ICD-10-CM | POA: Diagnosis not present

## 2022-10-26 DIAGNOSIS — R2689 Other abnormalities of gait and mobility: Secondary | ICD-10-CM | POA: Diagnosis not present

## 2022-10-26 DIAGNOSIS — Z741 Need for assistance with personal care: Secondary | ICD-10-CM | POA: Diagnosis not present

## 2022-10-26 DIAGNOSIS — J431 Panlobular emphysema: Secondary | ICD-10-CM | POA: Diagnosis not present

## 2022-10-26 DIAGNOSIS — J441 Chronic obstructive pulmonary disease with (acute) exacerbation: Secondary | ICD-10-CM | POA: Diagnosis not present

## 2022-10-26 DIAGNOSIS — Z9981 Dependence on supplemental oxygen: Secondary | ICD-10-CM | POA: Diagnosis not present

## 2022-10-29 ENCOUNTER — Encounter: Payer: Self-pay | Admitting: Emergency Medicine

## 2022-10-29 DIAGNOSIS — M6281 Muscle weakness (generalized): Secondary | ICD-10-CM | POA: Diagnosis not present

## 2022-10-29 DIAGNOSIS — J431 Panlobular emphysema: Secondary | ICD-10-CM | POA: Diagnosis not present

## 2022-10-29 DIAGNOSIS — Z741 Need for assistance with personal care: Secondary | ICD-10-CM | POA: Diagnosis not present

## 2022-10-29 DIAGNOSIS — I4811 Longstanding persistent atrial fibrillation: Secondary | ICD-10-CM | POA: Diagnosis not present

## 2022-10-29 DIAGNOSIS — I2510 Atherosclerotic heart disease of native coronary artery without angina pectoris: Secondary | ICD-10-CM | POA: Diagnosis not present

## 2022-10-29 DIAGNOSIS — J9621 Acute and chronic respiratory failure with hypoxia: Secondary | ICD-10-CM | POA: Diagnosis not present

## 2022-10-29 DIAGNOSIS — Z9981 Dependence on supplemental oxygen: Secondary | ICD-10-CM | POA: Diagnosis not present

## 2022-10-29 DIAGNOSIS — R278 Other lack of coordination: Secondary | ICD-10-CM | POA: Diagnosis not present

## 2022-10-29 DIAGNOSIS — J441 Chronic obstructive pulmonary disease with (acute) exacerbation: Secondary | ICD-10-CM | POA: Diagnosis not present

## 2022-10-29 DIAGNOSIS — R2689 Other abnormalities of gait and mobility: Secondary | ICD-10-CM | POA: Diagnosis not present

## 2022-10-29 DIAGNOSIS — I4891 Unspecified atrial fibrillation: Secondary | ICD-10-CM | POA: Diagnosis not present

## 2022-10-30 DIAGNOSIS — Z741 Need for assistance with personal care: Secondary | ICD-10-CM | POA: Diagnosis not present

## 2022-10-30 DIAGNOSIS — J431 Panlobular emphysema: Secondary | ICD-10-CM | POA: Diagnosis not present

## 2022-10-30 DIAGNOSIS — J9621 Acute and chronic respiratory failure with hypoxia: Secondary | ICD-10-CM | POA: Diagnosis not present

## 2022-10-30 DIAGNOSIS — R2689 Other abnormalities of gait and mobility: Secondary | ICD-10-CM | POA: Diagnosis not present

## 2022-10-30 DIAGNOSIS — Z9981 Dependence on supplemental oxygen: Secondary | ICD-10-CM | POA: Diagnosis not present

## 2022-10-30 DIAGNOSIS — I4891 Unspecified atrial fibrillation: Secondary | ICD-10-CM | POA: Diagnosis not present

## 2022-10-30 DIAGNOSIS — I2510 Atherosclerotic heart disease of native coronary artery without angina pectoris: Secondary | ICD-10-CM | POA: Diagnosis not present

## 2022-10-30 DIAGNOSIS — M6281 Muscle weakness (generalized): Secondary | ICD-10-CM | POA: Diagnosis not present

## 2022-10-30 DIAGNOSIS — I4811 Longstanding persistent atrial fibrillation: Secondary | ICD-10-CM | POA: Diagnosis not present

## 2022-10-30 DIAGNOSIS — R278 Other lack of coordination: Secondary | ICD-10-CM | POA: Diagnosis not present

## 2022-10-30 DIAGNOSIS — J441 Chronic obstructive pulmonary disease with (acute) exacerbation: Secondary | ICD-10-CM | POA: Diagnosis not present

## 2022-10-31 NOTE — Telephone Encounter (Signed)
Patient is scheduled and aware.

## 2022-11-01 DIAGNOSIS — Z9981 Dependence on supplemental oxygen: Secondary | ICD-10-CM | POA: Diagnosis not present

## 2022-11-01 DIAGNOSIS — I4891 Unspecified atrial fibrillation: Secondary | ICD-10-CM | POA: Diagnosis not present

## 2022-11-01 DIAGNOSIS — R2689 Other abnormalities of gait and mobility: Secondary | ICD-10-CM | POA: Diagnosis not present

## 2022-11-01 DIAGNOSIS — J431 Panlobular emphysema: Secondary | ICD-10-CM | POA: Diagnosis not present

## 2022-11-01 DIAGNOSIS — I4811 Longstanding persistent atrial fibrillation: Secondary | ICD-10-CM | POA: Diagnosis not present

## 2022-11-01 DIAGNOSIS — R278 Other lack of coordination: Secondary | ICD-10-CM | POA: Diagnosis not present

## 2022-11-01 DIAGNOSIS — M6281 Muscle weakness (generalized): Secondary | ICD-10-CM | POA: Diagnosis not present

## 2022-11-01 DIAGNOSIS — J441 Chronic obstructive pulmonary disease with (acute) exacerbation: Secondary | ICD-10-CM | POA: Diagnosis not present

## 2022-11-01 DIAGNOSIS — J9621 Acute and chronic respiratory failure with hypoxia: Secondary | ICD-10-CM | POA: Diagnosis not present

## 2022-11-01 DIAGNOSIS — I2510 Atherosclerotic heart disease of native coronary artery without angina pectoris: Secondary | ICD-10-CM | POA: Diagnosis not present

## 2022-11-01 DIAGNOSIS — Z741 Need for assistance with personal care: Secondary | ICD-10-CM | POA: Diagnosis not present

## 2022-11-02 DIAGNOSIS — I2510 Atherosclerotic heart disease of native coronary artery without angina pectoris: Secondary | ICD-10-CM | POA: Diagnosis not present

## 2022-11-02 DIAGNOSIS — R2689 Other abnormalities of gait and mobility: Secondary | ICD-10-CM | POA: Diagnosis not present

## 2022-11-02 DIAGNOSIS — J441 Chronic obstructive pulmonary disease with (acute) exacerbation: Secondary | ICD-10-CM | POA: Diagnosis not present

## 2022-11-02 DIAGNOSIS — M6281 Muscle weakness (generalized): Secondary | ICD-10-CM | POA: Diagnosis not present

## 2022-11-02 DIAGNOSIS — Z9981 Dependence on supplemental oxygen: Secondary | ICD-10-CM | POA: Diagnosis not present

## 2022-11-02 DIAGNOSIS — J431 Panlobular emphysema: Secondary | ICD-10-CM | POA: Diagnosis not present

## 2022-11-02 DIAGNOSIS — Z741 Need for assistance with personal care: Secondary | ICD-10-CM | POA: Diagnosis not present

## 2022-11-02 DIAGNOSIS — R278 Other lack of coordination: Secondary | ICD-10-CM | POA: Diagnosis not present

## 2022-11-02 DIAGNOSIS — I4811 Longstanding persistent atrial fibrillation: Secondary | ICD-10-CM | POA: Diagnosis not present

## 2022-11-02 DIAGNOSIS — J9621 Acute and chronic respiratory failure with hypoxia: Secondary | ICD-10-CM | POA: Diagnosis not present

## 2022-11-02 DIAGNOSIS — I4891 Unspecified atrial fibrillation: Secondary | ICD-10-CM | POA: Diagnosis not present

## 2022-11-05 DIAGNOSIS — J441 Chronic obstructive pulmonary disease with (acute) exacerbation: Secondary | ICD-10-CM | POA: Diagnosis not present

## 2022-11-05 DIAGNOSIS — J431 Panlobular emphysema: Secondary | ICD-10-CM | POA: Diagnosis not present

## 2022-11-05 DIAGNOSIS — Z741 Need for assistance with personal care: Secondary | ICD-10-CM | POA: Diagnosis not present

## 2022-11-05 DIAGNOSIS — I4891 Unspecified atrial fibrillation: Secondary | ICD-10-CM | POA: Diagnosis not present

## 2022-11-05 DIAGNOSIS — M6281 Muscle weakness (generalized): Secondary | ICD-10-CM | POA: Diagnosis not present

## 2022-11-05 DIAGNOSIS — I2510 Atherosclerotic heart disease of native coronary artery without angina pectoris: Secondary | ICD-10-CM | POA: Diagnosis not present

## 2022-11-05 DIAGNOSIS — R2689 Other abnormalities of gait and mobility: Secondary | ICD-10-CM | POA: Diagnosis not present

## 2022-11-05 DIAGNOSIS — R278 Other lack of coordination: Secondary | ICD-10-CM | POA: Diagnosis not present

## 2022-11-05 DIAGNOSIS — Z9981 Dependence on supplemental oxygen: Secondary | ICD-10-CM | POA: Diagnosis not present

## 2022-11-05 DIAGNOSIS — J9621 Acute and chronic respiratory failure with hypoxia: Secondary | ICD-10-CM | POA: Diagnosis not present

## 2022-11-05 DIAGNOSIS — I4811 Longstanding persistent atrial fibrillation: Secondary | ICD-10-CM | POA: Diagnosis not present

## 2022-11-06 ENCOUNTER — Other Ambulatory Visit: Payer: Self-pay | Admitting: Internal Medicine

## 2022-11-06 DIAGNOSIS — J441 Chronic obstructive pulmonary disease with (acute) exacerbation: Secondary | ICD-10-CM | POA: Diagnosis not present

## 2022-11-06 DIAGNOSIS — J9621 Acute and chronic respiratory failure with hypoxia: Secondary | ICD-10-CM | POA: Diagnosis not present

## 2022-11-06 DIAGNOSIS — J431 Panlobular emphysema: Secondary | ICD-10-CM

## 2022-11-06 DIAGNOSIS — R2689 Other abnormalities of gait and mobility: Secondary | ICD-10-CM | POA: Diagnosis not present

## 2022-11-06 DIAGNOSIS — I4891 Unspecified atrial fibrillation: Secondary | ICD-10-CM | POA: Diagnosis not present

## 2022-11-06 DIAGNOSIS — I4811 Longstanding persistent atrial fibrillation: Secondary | ICD-10-CM | POA: Diagnosis not present

## 2022-11-06 DIAGNOSIS — Z741 Need for assistance with personal care: Secondary | ICD-10-CM | POA: Diagnosis not present

## 2022-11-06 DIAGNOSIS — Z9981 Dependence on supplemental oxygen: Secondary | ICD-10-CM | POA: Diagnosis not present

## 2022-11-06 DIAGNOSIS — M6281 Muscle weakness (generalized): Secondary | ICD-10-CM | POA: Diagnosis not present

## 2022-11-06 DIAGNOSIS — R278 Other lack of coordination: Secondary | ICD-10-CM | POA: Diagnosis not present

## 2022-11-06 DIAGNOSIS — I2510 Atherosclerotic heart disease of native coronary artery without angina pectoris: Secondary | ICD-10-CM | POA: Diagnosis not present

## 2022-11-06 NOTE — Telephone Encounter (Signed)
Last filled 10-04-22 #30 Last OV 10-11-22 Next OV 01-07-23 [facility]

## 2022-11-07 ENCOUNTER — Ambulatory Visit
Admission: RE | Admit: 2022-11-07 | Discharge: 2022-11-07 | Disposition: A | Discharge status: Home / Self Care | Payer: Medicare Other | Source: Ambulatory Visit | Attending: Emergency Medicine | Admitting: Emergency Medicine

## 2022-11-07 ENCOUNTER — Other Ambulatory Visit: Payer: Medicare Other

## 2022-11-07 DIAGNOSIS — J431 Panlobular emphysema: Secondary | ICD-10-CM | POA: Diagnosis not present

## 2022-11-07 DIAGNOSIS — J439 Emphysema, unspecified: Secondary | ICD-10-CM | POA: Diagnosis not present

## 2022-11-07 DIAGNOSIS — I2510 Atherosclerotic heart disease of native coronary artery without angina pectoris: Secondary | ICD-10-CM | POA: Diagnosis not present

## 2022-11-07 DIAGNOSIS — M6281 Muscle weakness (generalized): Secondary | ICD-10-CM | POA: Diagnosis not present

## 2022-11-07 DIAGNOSIS — I4891 Unspecified atrial fibrillation: Secondary | ICD-10-CM | POA: Diagnosis not present

## 2022-11-07 DIAGNOSIS — Z741 Need for assistance with personal care: Secondary | ICD-10-CM | POA: Diagnosis not present

## 2022-11-07 DIAGNOSIS — J9621 Acute and chronic respiratory failure with hypoxia: Secondary | ICD-10-CM | POA: Diagnosis not present

## 2022-11-07 DIAGNOSIS — R911 Solitary pulmonary nodule: Secondary | ICD-10-CM | POA: Diagnosis not present

## 2022-11-07 DIAGNOSIS — R0602 Shortness of breath: Secondary | ICD-10-CM

## 2022-11-07 DIAGNOSIS — I4811 Longstanding persistent atrial fibrillation: Secondary | ICD-10-CM | POA: Diagnosis not present

## 2022-11-07 DIAGNOSIS — J441 Chronic obstructive pulmonary disease with (acute) exacerbation: Secondary | ICD-10-CM | POA: Diagnosis not present

## 2022-11-07 DIAGNOSIS — R278 Other lack of coordination: Secondary | ICD-10-CM | POA: Diagnosis not present

## 2022-11-07 DIAGNOSIS — Z9981 Dependence on supplemental oxygen: Secondary | ICD-10-CM | POA: Diagnosis not present

## 2022-11-07 DIAGNOSIS — R2689 Other abnormalities of gait and mobility: Secondary | ICD-10-CM | POA: Diagnosis not present

## 2022-11-08 DIAGNOSIS — J431 Panlobular emphysema: Secondary | ICD-10-CM | POA: Diagnosis not present

## 2022-11-08 DIAGNOSIS — J441 Chronic obstructive pulmonary disease with (acute) exacerbation: Secondary | ICD-10-CM | POA: Diagnosis not present

## 2022-11-08 DIAGNOSIS — Z741 Need for assistance with personal care: Secondary | ICD-10-CM | POA: Diagnosis not present

## 2022-11-08 DIAGNOSIS — I2510 Atherosclerotic heart disease of native coronary artery without angina pectoris: Secondary | ICD-10-CM | POA: Diagnosis not present

## 2022-11-08 DIAGNOSIS — J9621 Acute and chronic respiratory failure with hypoxia: Secondary | ICD-10-CM | POA: Diagnosis not present

## 2022-11-08 DIAGNOSIS — R2689 Other abnormalities of gait and mobility: Secondary | ICD-10-CM | POA: Diagnosis not present

## 2022-11-08 DIAGNOSIS — R278 Other lack of coordination: Secondary | ICD-10-CM | POA: Diagnosis not present

## 2022-11-08 DIAGNOSIS — I4891 Unspecified atrial fibrillation: Secondary | ICD-10-CM | POA: Diagnosis not present

## 2022-11-08 DIAGNOSIS — M6281 Muscle weakness (generalized): Secondary | ICD-10-CM | POA: Diagnosis not present

## 2022-11-08 DIAGNOSIS — Z9981 Dependence on supplemental oxygen: Secondary | ICD-10-CM | POA: Diagnosis not present

## 2022-11-08 DIAGNOSIS — I4811 Longstanding persistent atrial fibrillation: Secondary | ICD-10-CM | POA: Diagnosis not present

## 2022-11-09 ENCOUNTER — Encounter: Payer: Self-pay | Admitting: Internal Medicine

## 2022-11-09 ENCOUNTER — Encounter: Payer: Self-pay | Admitting: Emergency Medicine

## 2022-11-09 DIAGNOSIS — I4811 Longstanding persistent atrial fibrillation: Secondary | ICD-10-CM | POA: Diagnosis not present

## 2022-11-09 DIAGNOSIS — J431 Panlobular emphysema: Secondary | ICD-10-CM | POA: Diagnosis not present

## 2022-11-09 DIAGNOSIS — M6281 Muscle weakness (generalized): Secondary | ICD-10-CM | POA: Diagnosis not present

## 2022-11-09 DIAGNOSIS — R278 Other lack of coordination: Secondary | ICD-10-CM | POA: Diagnosis not present

## 2022-11-09 DIAGNOSIS — I4891 Unspecified atrial fibrillation: Secondary | ICD-10-CM | POA: Diagnosis not present

## 2022-11-09 DIAGNOSIS — Z741 Need for assistance with personal care: Secondary | ICD-10-CM | POA: Diagnosis not present

## 2022-11-09 DIAGNOSIS — J9621 Acute and chronic respiratory failure with hypoxia: Secondary | ICD-10-CM | POA: Diagnosis not present

## 2022-11-09 DIAGNOSIS — R2689 Other abnormalities of gait and mobility: Secondary | ICD-10-CM | POA: Diagnosis not present

## 2022-11-09 DIAGNOSIS — Z9981 Dependence on supplemental oxygen: Secondary | ICD-10-CM | POA: Diagnosis not present

## 2022-11-09 DIAGNOSIS — I2510 Atherosclerotic heart disease of native coronary artery without angina pectoris: Secondary | ICD-10-CM | POA: Diagnosis not present

## 2022-11-09 DIAGNOSIS — J441 Chronic obstructive pulmonary disease with (acute) exacerbation: Secondary | ICD-10-CM | POA: Diagnosis not present

## 2022-11-12 DIAGNOSIS — R2689 Other abnormalities of gait and mobility: Secondary | ICD-10-CM | POA: Diagnosis not present

## 2022-11-12 DIAGNOSIS — M6281 Muscle weakness (generalized): Secondary | ICD-10-CM | POA: Diagnosis not present

## 2022-11-12 DIAGNOSIS — R278 Other lack of coordination: Secondary | ICD-10-CM | POA: Diagnosis not present

## 2022-11-12 DIAGNOSIS — J9621 Acute and chronic respiratory failure with hypoxia: Secondary | ICD-10-CM | POA: Diagnosis not present

## 2022-11-12 DIAGNOSIS — J441 Chronic obstructive pulmonary disease with (acute) exacerbation: Secondary | ICD-10-CM | POA: Diagnosis not present

## 2022-11-12 DIAGNOSIS — Z741 Need for assistance with personal care: Secondary | ICD-10-CM | POA: Diagnosis not present

## 2022-11-12 DIAGNOSIS — Z9981 Dependence on supplemental oxygen: Secondary | ICD-10-CM | POA: Diagnosis not present

## 2022-11-12 DIAGNOSIS — I2510 Atherosclerotic heart disease of native coronary artery without angina pectoris: Secondary | ICD-10-CM | POA: Diagnosis not present

## 2022-11-12 DIAGNOSIS — I4811 Longstanding persistent atrial fibrillation: Secondary | ICD-10-CM | POA: Diagnosis not present

## 2022-11-12 DIAGNOSIS — J431 Panlobular emphysema: Secondary | ICD-10-CM | POA: Diagnosis not present

## 2022-11-12 DIAGNOSIS — I4891 Unspecified atrial fibrillation: Secondary | ICD-10-CM | POA: Diagnosis not present

## 2022-11-13 DIAGNOSIS — J431 Panlobular emphysema: Secondary | ICD-10-CM | POA: Diagnosis not present

## 2022-11-13 DIAGNOSIS — Z9981 Dependence on supplemental oxygen: Secondary | ICD-10-CM | POA: Diagnosis not present

## 2022-11-13 DIAGNOSIS — I4811 Longstanding persistent atrial fibrillation: Secondary | ICD-10-CM | POA: Diagnosis not present

## 2022-11-13 DIAGNOSIS — J9621 Acute and chronic respiratory failure with hypoxia: Secondary | ICD-10-CM | POA: Diagnosis not present

## 2022-11-13 DIAGNOSIS — I2510 Atherosclerotic heart disease of native coronary artery without angina pectoris: Secondary | ICD-10-CM | POA: Diagnosis not present

## 2022-11-13 DIAGNOSIS — M6281 Muscle weakness (generalized): Secondary | ICD-10-CM | POA: Diagnosis not present

## 2022-11-13 DIAGNOSIS — J441 Chronic obstructive pulmonary disease with (acute) exacerbation: Secondary | ICD-10-CM | POA: Diagnosis not present

## 2022-11-13 DIAGNOSIS — R278 Other lack of coordination: Secondary | ICD-10-CM | POA: Diagnosis not present

## 2022-11-13 DIAGNOSIS — R2689 Other abnormalities of gait and mobility: Secondary | ICD-10-CM | POA: Diagnosis not present

## 2022-11-13 DIAGNOSIS — Z741 Need for assistance with personal care: Secondary | ICD-10-CM | POA: Diagnosis not present

## 2022-11-13 DIAGNOSIS — I4891 Unspecified atrial fibrillation: Secondary | ICD-10-CM | POA: Diagnosis not present

## 2022-11-13 NOTE — Telephone Encounter (Signed)
Dr. Delton Coombes: Patient is calling regarding the results on the CT Super D Chest scan done on 11/07/2022. I do not see where the results have been reviewed.  Please advise and thank you.  (See patient note below)  [name] [name].  P [facility] (supporting [name] Peer, MD)4 days ago   The CT Scan reads fairly good to me. I don't see that anything is a lot worse than it was a year ago. What do you think?   Please note the fractured rib. That explains the pain that I have been having on my left side since falling a week ago. It has lessened every day and I didn't notice it at all today. I assume that it will heal itself.

## 2022-11-14 DIAGNOSIS — R278 Other lack of coordination: Secondary | ICD-10-CM | POA: Diagnosis not present

## 2022-11-14 DIAGNOSIS — J441 Chronic obstructive pulmonary disease with (acute) exacerbation: Secondary | ICD-10-CM | POA: Diagnosis not present

## 2022-11-14 DIAGNOSIS — J9621 Acute and chronic respiratory failure with hypoxia: Secondary | ICD-10-CM | POA: Diagnosis not present

## 2022-11-14 DIAGNOSIS — I4891 Unspecified atrial fibrillation: Secondary | ICD-10-CM | POA: Diagnosis not present

## 2022-11-14 DIAGNOSIS — I4811 Longstanding persistent atrial fibrillation: Secondary | ICD-10-CM | POA: Diagnosis not present

## 2022-11-14 DIAGNOSIS — J431 Panlobular emphysema: Secondary | ICD-10-CM | POA: Diagnosis not present

## 2022-11-14 DIAGNOSIS — Z9981 Dependence on supplemental oxygen: Secondary | ICD-10-CM | POA: Diagnosis not present

## 2022-11-14 DIAGNOSIS — I2510 Atherosclerotic heart disease of native coronary artery without angina pectoris: Secondary | ICD-10-CM | POA: Diagnosis not present

## 2022-11-14 DIAGNOSIS — R2689 Other abnormalities of gait and mobility: Secondary | ICD-10-CM | POA: Diagnosis not present

## 2022-11-14 DIAGNOSIS — Z741 Need for assistance with personal care: Secondary | ICD-10-CM | POA: Diagnosis not present

## 2022-11-14 DIAGNOSIS — M6281 Muscle weakness (generalized): Secondary | ICD-10-CM | POA: Diagnosis not present

## 2022-11-15 DIAGNOSIS — J9621 Acute and chronic respiratory failure with hypoxia: Secondary | ICD-10-CM | POA: Diagnosis not present

## 2022-11-15 DIAGNOSIS — I4891 Unspecified atrial fibrillation: Secondary | ICD-10-CM | POA: Diagnosis not present

## 2022-11-15 DIAGNOSIS — M6281 Muscle weakness (generalized): Secondary | ICD-10-CM | POA: Diagnosis not present

## 2022-11-15 DIAGNOSIS — R278 Other lack of coordination: Secondary | ICD-10-CM | POA: Diagnosis not present

## 2022-11-15 DIAGNOSIS — I2510 Atherosclerotic heart disease of native coronary artery without angina pectoris: Secondary | ICD-10-CM | POA: Diagnosis not present

## 2022-11-15 DIAGNOSIS — J431 Panlobular emphysema: Secondary | ICD-10-CM | POA: Diagnosis not present

## 2022-11-15 DIAGNOSIS — Z9981 Dependence on supplemental oxygen: Secondary | ICD-10-CM | POA: Diagnosis not present

## 2022-11-15 DIAGNOSIS — J449 Chronic obstructive pulmonary disease, unspecified: Secondary | ICD-10-CM | POA: Diagnosis not present

## 2022-11-15 DIAGNOSIS — I4811 Longstanding persistent atrial fibrillation: Secondary | ICD-10-CM | POA: Diagnosis not present

## 2022-11-15 DIAGNOSIS — Z741 Need for assistance with personal care: Secondary | ICD-10-CM | POA: Diagnosis not present

## 2022-11-15 DIAGNOSIS — J9611 Chronic respiratory failure with hypoxia: Secondary | ICD-10-CM | POA: Diagnosis not present

## 2022-11-15 DIAGNOSIS — J441 Chronic obstructive pulmonary disease with (acute) exacerbation: Secondary | ICD-10-CM | POA: Diagnosis not present

## 2022-11-15 DIAGNOSIS — R2689 Other abnormalities of gait and mobility: Secondary | ICD-10-CM | POA: Diagnosis not present

## 2022-11-16 DIAGNOSIS — I4811 Longstanding persistent atrial fibrillation: Secondary | ICD-10-CM | POA: Diagnosis not present

## 2022-11-16 DIAGNOSIS — I2510 Atherosclerotic heart disease of native coronary artery without angina pectoris: Secondary | ICD-10-CM | POA: Diagnosis not present

## 2022-11-16 DIAGNOSIS — Z741 Need for assistance with personal care: Secondary | ICD-10-CM | POA: Diagnosis not present

## 2022-11-16 DIAGNOSIS — Z9981 Dependence on supplemental oxygen: Secondary | ICD-10-CM | POA: Diagnosis not present

## 2022-11-16 DIAGNOSIS — J9621 Acute and chronic respiratory failure with hypoxia: Secondary | ICD-10-CM | POA: Diagnosis not present

## 2022-11-16 DIAGNOSIS — J431 Panlobular emphysema: Secondary | ICD-10-CM | POA: Diagnosis not present

## 2022-11-16 DIAGNOSIS — M6281 Muscle weakness (generalized): Secondary | ICD-10-CM | POA: Diagnosis not present

## 2022-11-16 DIAGNOSIS — I4891 Unspecified atrial fibrillation: Secondary | ICD-10-CM | POA: Diagnosis not present

## 2022-11-16 DIAGNOSIS — R2689 Other abnormalities of gait and mobility: Secondary | ICD-10-CM | POA: Diagnosis not present

## 2022-11-16 DIAGNOSIS — J441 Chronic obstructive pulmonary disease with (acute) exacerbation: Secondary | ICD-10-CM | POA: Diagnosis not present

## 2022-11-16 DIAGNOSIS — R278 Other lack of coordination: Secondary | ICD-10-CM | POA: Diagnosis not present

## 2022-11-18 ENCOUNTER — Other Ambulatory Visit: Payer: Self-pay | Admitting: Cardiology

## 2022-11-18 ENCOUNTER — Other Ambulatory Visit: Payer: Self-pay | Admitting: Urology

## 2022-11-19 DIAGNOSIS — L820 Inflamed seborrheic keratosis: Secondary | ICD-10-CM | POA: Diagnosis not present

## 2022-11-19 DIAGNOSIS — L570 Actinic keratosis: Secondary | ICD-10-CM | POA: Diagnosis not present

## 2022-11-20 DIAGNOSIS — J431 Panlobular emphysema: Secondary | ICD-10-CM | POA: Diagnosis not present

## 2022-11-20 DIAGNOSIS — Z9981 Dependence on supplemental oxygen: Secondary | ICD-10-CM | POA: Diagnosis not present

## 2022-11-20 DIAGNOSIS — M6281 Muscle weakness (generalized): Secondary | ICD-10-CM | POA: Diagnosis not present

## 2022-11-20 DIAGNOSIS — I4811 Longstanding persistent atrial fibrillation: Secondary | ICD-10-CM | POA: Diagnosis not present

## 2022-11-20 DIAGNOSIS — I2510 Atherosclerotic heart disease of native coronary artery without angina pectoris: Secondary | ICD-10-CM | POA: Diagnosis not present

## 2022-11-20 DIAGNOSIS — J9621 Acute and chronic respiratory failure with hypoxia: Secondary | ICD-10-CM | POA: Diagnosis not present

## 2022-11-20 DIAGNOSIS — J441 Chronic obstructive pulmonary disease with (acute) exacerbation: Secondary | ICD-10-CM | POA: Diagnosis not present

## 2022-11-20 DIAGNOSIS — R2689 Other abnormalities of gait and mobility: Secondary | ICD-10-CM | POA: Diagnosis not present

## 2022-11-20 DIAGNOSIS — Z741 Need for assistance with personal care: Secondary | ICD-10-CM | POA: Diagnosis not present

## 2022-11-20 DIAGNOSIS — R278 Other lack of coordination: Secondary | ICD-10-CM | POA: Diagnosis not present

## 2022-11-20 DIAGNOSIS — I4891 Unspecified atrial fibrillation: Secondary | ICD-10-CM | POA: Diagnosis not present

## 2022-11-21 DIAGNOSIS — M6281 Muscle weakness (generalized): Secondary | ICD-10-CM | POA: Diagnosis not present

## 2022-11-21 DIAGNOSIS — Z9981 Dependence on supplemental oxygen: Secondary | ICD-10-CM | POA: Diagnosis not present

## 2022-11-21 DIAGNOSIS — J431 Panlobular emphysema: Secondary | ICD-10-CM | POA: Diagnosis not present

## 2022-11-21 DIAGNOSIS — R2689 Other abnormalities of gait and mobility: Secondary | ICD-10-CM | POA: Diagnosis not present

## 2022-11-21 DIAGNOSIS — Z741 Need for assistance with personal care: Secondary | ICD-10-CM | POA: Diagnosis not present

## 2022-11-21 DIAGNOSIS — R278 Other lack of coordination: Secondary | ICD-10-CM | POA: Diagnosis not present

## 2022-11-21 DIAGNOSIS — I4891 Unspecified atrial fibrillation: Secondary | ICD-10-CM | POA: Diagnosis not present

## 2022-11-21 DIAGNOSIS — I2510 Atherosclerotic heart disease of native coronary artery without angina pectoris: Secondary | ICD-10-CM | POA: Diagnosis not present

## 2022-11-21 DIAGNOSIS — J441 Chronic obstructive pulmonary disease with (acute) exacerbation: Secondary | ICD-10-CM | POA: Diagnosis not present

## 2022-11-21 DIAGNOSIS — J9621 Acute and chronic respiratory failure with hypoxia: Secondary | ICD-10-CM | POA: Diagnosis not present

## 2022-11-21 DIAGNOSIS — I4811 Longstanding persistent atrial fibrillation: Secondary | ICD-10-CM | POA: Diagnosis not present

## 2022-11-22 DIAGNOSIS — R278 Other lack of coordination: Secondary | ICD-10-CM | POA: Diagnosis not present

## 2022-11-22 DIAGNOSIS — Z9981 Dependence on supplemental oxygen: Secondary | ICD-10-CM | POA: Diagnosis not present

## 2022-11-22 DIAGNOSIS — I4891 Unspecified atrial fibrillation: Secondary | ICD-10-CM | POA: Diagnosis not present

## 2022-11-22 DIAGNOSIS — J441 Chronic obstructive pulmonary disease with (acute) exacerbation: Secondary | ICD-10-CM | POA: Diagnosis not present

## 2022-11-22 DIAGNOSIS — I4811 Longstanding persistent atrial fibrillation: Secondary | ICD-10-CM | POA: Diagnosis not present

## 2022-11-22 DIAGNOSIS — M6281 Muscle weakness (generalized): Secondary | ICD-10-CM | POA: Diagnosis not present

## 2022-11-22 DIAGNOSIS — Z741 Need for assistance with personal care: Secondary | ICD-10-CM | POA: Diagnosis not present

## 2022-11-22 DIAGNOSIS — J9621 Acute and chronic respiratory failure with hypoxia: Secondary | ICD-10-CM | POA: Diagnosis not present

## 2022-11-22 DIAGNOSIS — R2689 Other abnormalities of gait and mobility: Secondary | ICD-10-CM | POA: Diagnosis not present

## 2022-11-22 DIAGNOSIS — J431 Panlobular emphysema: Secondary | ICD-10-CM | POA: Diagnosis not present

## 2022-11-22 DIAGNOSIS — I2510 Atherosclerotic heart disease of native coronary artery without angina pectoris: Secondary | ICD-10-CM | POA: Diagnosis not present

## 2022-11-23 DIAGNOSIS — M6281 Muscle weakness (generalized): Secondary | ICD-10-CM | POA: Diagnosis not present

## 2022-11-23 DIAGNOSIS — J9621 Acute and chronic respiratory failure with hypoxia: Secondary | ICD-10-CM | POA: Diagnosis not present

## 2022-11-23 DIAGNOSIS — R278 Other lack of coordination: Secondary | ICD-10-CM | POA: Diagnosis not present

## 2022-11-23 DIAGNOSIS — R2689 Other abnormalities of gait and mobility: Secondary | ICD-10-CM | POA: Diagnosis not present

## 2022-11-23 DIAGNOSIS — J441 Chronic obstructive pulmonary disease with (acute) exacerbation: Secondary | ICD-10-CM | POA: Diagnosis not present

## 2022-11-23 DIAGNOSIS — J431 Panlobular emphysema: Secondary | ICD-10-CM | POA: Diagnosis not present

## 2022-11-23 DIAGNOSIS — Z741 Need for assistance with personal care: Secondary | ICD-10-CM | POA: Diagnosis not present

## 2022-11-23 DIAGNOSIS — I2510 Atherosclerotic heart disease of native coronary artery without angina pectoris: Secondary | ICD-10-CM | POA: Diagnosis not present

## 2022-11-23 DIAGNOSIS — Z9981 Dependence on supplemental oxygen: Secondary | ICD-10-CM | POA: Diagnosis not present

## 2022-11-23 DIAGNOSIS — I4891 Unspecified atrial fibrillation: Secondary | ICD-10-CM | POA: Diagnosis not present

## 2022-11-23 DIAGNOSIS — I4811 Longstanding persistent atrial fibrillation: Secondary | ICD-10-CM | POA: Diagnosis not present

## 2022-11-26 ENCOUNTER — Encounter: Payer: Self-pay | Admitting: Internal Medicine

## 2022-11-26 ENCOUNTER — Ambulatory Visit (INDEPENDENT_AMBULATORY_CARE_PROVIDER_SITE_OTHER): Payer: Medicare Other | Admitting: Internal Medicine

## 2022-11-26 VITALS — BP 126/70 | HR 78 | Temp 97.6°F | Ht 70.5 in | Wt 156.0 lb

## 2022-11-26 DIAGNOSIS — R278 Other lack of coordination: Secondary | ICD-10-CM | POA: Diagnosis not present

## 2022-11-26 DIAGNOSIS — F1020 Alcohol dependence, uncomplicated: Secondary | ICD-10-CM

## 2022-11-26 DIAGNOSIS — I2510 Atherosclerotic heart disease of native coronary artery without angina pectoris: Secondary | ICD-10-CM | POA: Diagnosis not present

## 2022-11-26 DIAGNOSIS — R2689 Other abnormalities of gait and mobility: Secondary | ICD-10-CM | POA: Diagnosis not present

## 2022-11-26 DIAGNOSIS — I4811 Longstanding persistent atrial fibrillation: Secondary | ICD-10-CM | POA: Diagnosis not present

## 2022-11-26 DIAGNOSIS — G629 Polyneuropathy, unspecified: Secondary | ICD-10-CM

## 2022-11-26 DIAGNOSIS — I739 Peripheral vascular disease, unspecified: Secondary | ICD-10-CM | POA: Diagnosis not present

## 2022-11-26 DIAGNOSIS — M6281 Muscle weakness (generalized): Secondary | ICD-10-CM | POA: Diagnosis not present

## 2022-11-26 DIAGNOSIS — J431 Panlobular emphysema: Secondary | ICD-10-CM | POA: Diagnosis not present

## 2022-11-26 DIAGNOSIS — J441 Chronic obstructive pulmonary disease with (acute) exacerbation: Secondary | ICD-10-CM | POA: Diagnosis not present

## 2022-11-26 DIAGNOSIS — I4891 Unspecified atrial fibrillation: Secondary | ICD-10-CM | POA: Diagnosis not present

## 2022-11-26 DIAGNOSIS — J9621 Acute and chronic respiratory failure with hypoxia: Secondary | ICD-10-CM | POA: Diagnosis not present

## 2022-11-26 DIAGNOSIS — Z9981 Dependence on supplemental oxygen: Secondary | ICD-10-CM | POA: Diagnosis not present

## 2022-11-26 DIAGNOSIS — Z741 Need for assistance with personal care: Secondary | ICD-10-CM | POA: Diagnosis not present

## 2022-11-26 LAB — VITAMIN B12: Vitamin B-12: 368 pg/mL (ref 211–911)

## 2022-11-26 NOTE — Assessment & Plan Note (Signed)
Not really having claudication at this point

## 2022-11-26 NOTE — Progress Notes (Signed)
Subjective:    Patient ID: [name] Register., male    DOB: 21-May-1941, 82 y.o.   MRN: [id number]  HPI Here with wife due to worsened left foot pain  Notices pain/burning in foot at night Left foot is red in the morning---and the toes are burning Feels better when he puts socks on Tried socks in bed the last few nights---this has helped  Callous on left great toe--shaved off a few weeks ago Still painful Does use pumice stone at times  Pain with walking is just the callous  No alcohol for 15 years Never took thiamine  Current Outpatient Medications on File Prior to Visit  Medication Sig Dispense Refill   acetaminophen (TYLENOL) 325 MG tablet Take 650 mg by mouth every 4 (four) hours as needed.     albuterol (PROVENTIL) (2.5 MG/3ML) 0.083% nebulizer solution Take 2.5 mg by nebulization 2 (two) times daily.     albuterol (VENTOLIN HFA) 108 (90 Base) MCG/ACT inhaler Inhale 2 puffs into the lungs 3 (three) times daily as needed for wheezing or shortness of breath.     ammonium lactate (AMLACTIN) 12 % cream Apply topically at bedtime.     apixaban (ELIQUIS) 5 MG TABS tablet Take 1 tablet (5 mg total) by mouth 2 (two) times daily. 60 tablet 5   Budeson-Glycopyrrol-Formoterol (BREZTRI AEROSPHERE) 160-9-4.8 MCG/ACT AERO Inhale 2 puffs into the lungs 2 (two) times daily. 10.7 g 6   clonazePAM (KLONOPIN) 0.5 MG tablet TAKE 1 TABLET BY MOUTH EVERY DAY AS NEEDED FOR ANXIETY 30 tablet 0   digoxin (LANOXIN) 0.125 MG tablet Take 1 tablet (0.125 mg total) by mouth daily. 90 tablet 3   diltiazem (CARDIZEM CD) 120 MG 24 hr capsule Take 1 capsule (120 mg total) by mouth 2 (two) times daily. 180 capsule 3   finasteride (PROSCAR) 5 MG tablet Take 1 tablet (5 mg total) by mouth daily. 90 tablet 3   furosemide (LASIX) 20 MG tablet Take one tablet (20 mg) by mouth daily with an addition tablet to equal (40 mg) every other day. 90 tablet 3   gabapentin (NEURONTIN) 100 MG capsule Take 100 mg by mouth 2  (two) times daily.     ipratropium (ATROVENT) 0.03 % nasal spray Place 2 sprays into both nostrils every 12 (twelve) hours. 30 mL 12   ipratropium-albuterol (DUONEB) 0.5-2.5 (3) MG/3ML SOLN Take 3 mLs by nebulization every 6 (six) hours as needed. 360 mL 1   nitroGLYCERIN (NITROSTAT) 0.4 MG SL tablet Place 1 tablet (0.4 mg total) under the tongue every 5 (five) minutes as needed for chest pain. 25 tablet 4   OXYGEN Inhale into the lungs. 2 liters upon exertion and 3 liters continuous at night.     pantoprazole (PROTONIX) 40 MG tablet Take 40 mg by mouth daily.     predniSONE (DELTASONE) 10 MG tablet Take 10 mg by mouth daily with breakfast.     rosuvastatin (CRESTOR) 20 MG tablet TAKE 1 TABLET BY MOUTH EVERYDAY AT BEDTIME 90 tablet 0   silver sulfADIAZINE (SILVADENE) 1 % cream Apply 1 Application topically daily. 50 g 0   ketoconazole (NIZORAL) 2 % cream Apply topically 2 (two) times daily. (Patient not taking: Reported on 11/26/2022)     Melatonin 5 MG CAPS Take 5 mg by mouth at bedtime. (Patient not taking: Reported on 11/26/2022)     polyethylene glycol (MIRALAX / GLYCOLAX) 17 g packet Take 17 g by mouth as needed. (Patient not taking: Reported on  11/26/2022)     No current facility-administered medications on file prior to visit.    Allergies  Allergen Reactions   Flagyl [Metronidazole] Other (See Comments)    Other reaction(s): skin reaction    Past Medical History:  Diagnosis Date   Abdominal discomfort 12/07/2020   Acquired trigger finger of right middle finger 02/04/2020   Acute prostatitis 04/08/2018   Alcohol abuse    Alcohol dependence (HCC) 12/07/2020   Anal or rectal pain 04/08/2018   Annual physical exam 12/07/2020   Anorectal disorder 12/07/2020   Arthritis    Atherosclerotic heart disease of native coronary artery without angina pectoris 04/08/2018   a. 03/2018 PCI: LM nl, LAD 67m, LCX nl, OM1 70p (2.75x8 Synergy DES), 80m (2.5x24 Synergy DES), RCA 25p, RPL1 50. EF  55-65%.   Atrial flutter (HCC)    a. 07/2022 in setting of hospitalization for resp illness/RSV. CHA2DS2VASc = 4-->eliquis; b. 08/2022 s/p DCCV (100J); c. 09/26/2022 Recurrent Aflutter.   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 02/2022 Carotid U/S: 1-39% bilat ICA stenoses.   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   Contracture of palmar fascia 12/07/2020   COPD (chronic obstructive pulmonary disease) (HCC)    Corn of toe 12/07/2020   Corns and callosities 04/09/2018   Coronary artery disease    2019 with stents   Coronary atherosclerosis 05/07/2018   Degenerative disc disease, 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   Degenerative disc disease, 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   Diarrhea 04/09/2018   Double vision with both eyes open 12/07/2020   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   Dysphagia 08/14/2018   Dyspnea    ED (erectile dysfunction)    ED (erectile dysfunction) of organic origin 12/07/2020   Elevated prostate specific antigen (PSA) 04/08/2018   Elevated PSA    Encounter for screening for other disorder 12/07/2020   Enlarged prostate without lower urinary tract symptoms (luts) 04/09/2018   Enthesopathy 12/07/2020   Essential (primary) hypertension 04/08/2018   Ex-smoker 04/09/2018   Exertional dyspnea 04/02/2018   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 07/2022 Echo: EF 65-70%, no rwma, mild LVH, nl RV fxn.   Hyperlipidemia    Hypertension    Hypoxemia 12/07/2020   Kidney cysts    Left knee pain 12/07/2020   Low back pain 04/08/2018   Male erectile dysfunction 04/09/2018   Mixed hyperlipidemia 04/08/2018   Nocturia more than twice per night 04/09/2018   Osteoarthritis of first carpometacarpal joint 04/08/2018   Osteoarthritis of knee 12/07/2020   Other long term (current) drug therapy 12/07/2020   Pain due to onychomycosis of toenail of left foot 07/20/2019   Pain in joint 04/09/2018   Pain in knee 04/09/2018   Pain in unspecified knee 12/07/2020   Pain of finger 04/09/2018   Palpitations    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HCC) 12/07/2020   Personal history of colonic polyps  12/07/2020   Pneumonia    Pneumothorax 04/12/2020   Polypharmacy 04/09/2018   Porokeratosis 01/13/2019   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HCC)    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   Pulmonary nodules 06/08/2016   Pure hypercholesterolemia 12/07/2020   Renal cyst 12/07/2020   Sleep disorder 04/08/2018   Status post bronchoscopy 04/12/2020   Tear of rotator cuff 04/09/2018   Tobacco user 12/07/2020   Uncomplicated alcohol dependence (HCC) 04/09/2018   Unspecified rotator cuff tear or rupture of unspecified shoulder, not specified as traumatic 12/07/2020   Unspecified tear of unspecified meniscus, current injury, unspecified knee, initial encounter 12/07/2020   Visual disturbance 12/07/2020   Vitamin D deficiency 04/08/2018    Past Surgical History:  Procedure Laterality Date   BRONCHIAL BIOPSY  10/13/2019   Procedure: BRONCHIAL BIOPSIES;  Surgeon: Leslye Peer, MD;  Location: [facility] ENDOSCOPY;  Service: Pulmonary;;   BRONCHIAL BIOPSY  04/12/2020   Procedure: BRONCHIAL BIOPSIES;  Surgeon: Leslye Peer, MD;  Location: [facility] ENDOSCOPY;  Service: Pulmonary;;   BRONCHIAL BRUSHINGS  10/13/2019   Procedure: BRONCHIAL BRUSHINGS;  Surgeon: Leslye Peer, MD;  Location: [facility] ENDOSCOPY;  Service: Pulmonary;;   BRONCHIAL BRUSHINGS  04/12/2020   Procedure: BRONCHIAL BRUSHINGS;  Surgeon: Leslye Peer, MD;  Location: [facility] ENDOSCOPY;  Service: Pulmonary;;   BRONCHIAL NEEDLE ASPIRATION BIOPSY  10/13/2019   Procedure: BRONCHIAL NEEDLE ASPIRATION BIOPSIES;  Surgeon: Leslye Peer, MD;  Location: [facility] ENDOSCOPY;  Service: Pulmonary;;   BRONCHIAL NEEDLE ASPIRATION BIOPSY  04/12/2020   Procedure: BRONCHIAL NEEDLE ASPIRATION BIOPSIES;  Surgeon: Leslye Peer, MD;  Location: [facility] ENDOSCOPY;  Service: Pulmonary;;   BRONCHIAL WASHINGS  10/13/2019   Procedure: BRONCHIAL WASHINGS;  Surgeon: Leslye Peer, MD;  Location: [facility] ENDOSCOPY;  Service: Pulmonary;;   BRONCHIAL WASHINGS  04/12/2020   Procedure: BRONCHIAL WASHINGS;  Surgeon: Leslye Peer, MD;  Location: [facility] ENDOSCOPY;  Service: Pulmonary;;   CARDIAC CATHETERIZATION  2002   50% RCA   CARDIOVERSION N/A 09/17/2022   Procedure: CARDIOVERSION;  Surgeon: Iran Ouch, MD;  Location: [facility] ORS;  Service: Cardiovascular;  Laterality: N/A;   CATARACT EXTRACTION, BILATERAL     CORONARY STENT INTERVENTION N/A 04/15/2018   Procedure: CORONARY STENT INTERVENTION;  Surgeon: Corky Crafts, MD;  Location: [facility] INVASIVE CV LAB;  Service: Cardiovascular;  Laterality: N/A;  om1   EYE SURGERY     KNEE ARTHROSCOPY     LEFT HEART CATH AND CORONARY ANGIOGRAPHY N/A 04/15/2018   Procedure: LEFT HEART CATH AND CORONARY ANGIOGRAPHY;  Surgeon: Corky Crafts, MD;  Location[facility] INVASIVE CV LAB;  Service: Cardiovascular;  Laterality: N/A;   MULTIPLE TOOTH EXTRACTIONS     TONSILLECTOMY     VIDEO BRONCHOSCOPY  04/12/2020   VIDEO BRONCHOSCOPY WITH ENDOBRONCHIAL NAVIGATION N/A 07/26/2016   Procedure: VIDEO BRONCHOSCOPY WITH ENDOBRONCHIAL NAVIGATION;  Surgeon: Leslye Peer, MD;  Location: [facility] OR;  Service: Thoracic;  Laterality: N/A;   VIDEO BRONCHOSCOPY WITH ENDOBRONCHIAL NAVIGATION N/A 10/13/2019   Procedure:  [facility] AND VIDEO BRONCHOSCOPY WITH ENDOBRONCHIAL NAVIGATION;  Surgeon: Leslye Peer, MD;  Location: [facility] ENDOSCOPY;  Service: Pulmonary;  Laterality: N/A;   VIDEO BRONCHOSCOPY WITH ENDOBRONCHIAL NAVIGATION N/A 04/12/2020   Procedure: VIDEO BRONCHOSCOPY WITH ENDOBRONCHIAL NAVIGATION;  Surgeon: Leslye Peer, MD;  Location: [facility] ENDOSCOPY;  Service: Pulmonary;  Laterality: N/A;    Family History  Problem Relation Age of Onset   Hypertension Mother    Alzheimer's disease Mother     Social History   Socioeconomic  History   Marital status: Married    Spouse name: Not on file   Number of children: 2   Years of education: Not on file   Highest education level: Not on file  Occupational History   Occupation: Special educational needs teacher, insurance, 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  Tobacco Use   Smoking status: Former    Packs/day: 0.75    Years: 67.00    Additional pack years: 0.00    Total pack years: 50.25    Types: Cigarettes    Start date: 62    Quit date: 07/13/2020    Years since quitting: 2.3    Passive exposure: Never   Smokeless tobacco: Never   Tobacco comments:    Recent Quit    Vaping Use   Vaping Use: Former  Substance and Sexual Activity   Alcohol use: No    Alcohol/week: 0.0 standard drinks of alcohol    Comment: quit drinking 2014   Drug use: No   Sexual activity: Not on file  Other Topics Concern   Not on file  Social History Narrative   Has living will   Wife is health care POA--alternate is son Jeanice Lim)   Has daughter in [place]   Has DNR   No tube feeds if cognitively unaware   Social Determinants of Health   Financial Resource Strain: Not on file  Food Insecurity: No Food Insecurity (08/13/2022)   Hunger Vital Sign    Worried About Running Out of Food in the Last Year: Never true    Ran Out of Food in the Last Year: Never true  Transportation Needs: No Transportation Needs (08/13/2022)   PRAPARE - Administrator, Civil Service (Medical):  No    Lack of Transportation (Non-Medical): No  Physical Activity: Not on file  Stress: Not on file  Social Connections: Not on file  Intimate Partner Violence: Not At Risk (08/13/2022)   Humiliation, Afraid, Rape, and Kick questionnaire    Fear of Current or Ex-Partner: No    Emotionally Abused: No    Physically Abused: No    Sexually Abused: No   Review of Systems Does use cushioned insoles in shoes/sandals No fever and not sick     Objective:   Physical Exam Constitutional:      Appearance: Normal appearance.  Cardiovascular:     Comments: Trace pulse on left Absent on right Skin:    Comments: Callous with ?wart on plantar left great toe Ulcer on dorsum of left 2nd toe--granulated Mild diffuse redness in left toes and forefoot No infection  Neurological:     Mental Status: He is alert.            Assessment & Plan:

## 2022-11-26 NOTE — Assessment & Plan Note (Signed)
No recent alcohol Discussed considering a multivitamin

## 2022-11-26 NOTE — Assessment & Plan Note (Signed)
Fairly classic symptoms Discussed continuing the socks Will check B12 level If worsens, could consider increasing low dose gabapentin

## 2022-11-28 ENCOUNTER — Other Ambulatory Visit: Payer: Self-pay | Admitting: Emergency Medicine

## 2022-11-29 ENCOUNTER — Encounter: Payer: Self-pay | Admitting: Internal Medicine

## 2022-11-29 ENCOUNTER — Ambulatory Visit (INDEPENDENT_AMBULATORY_CARE_PROVIDER_SITE_OTHER): Payer: Medicare Other | Admitting: Internal Medicine

## 2022-11-29 VITALS — BP 106/58 | HR 80 | Temp 98.1°F | Ht 70.5 in | Wt 156.0 lb

## 2022-11-29 DIAGNOSIS — L03115 Cellulitis of right lower limb: Secondary | ICD-10-CM

## 2022-11-29 MED ORDER — DOXYCYCLINE HYCLATE 100 MG PO TABS: 100.0000 mg | ORAL_TABLET | Freq: Two times a day (BID) | ORAL | 0 refills | Status: DC

## 2022-11-29 NOTE — Progress Notes (Signed)
Subjective:    Patient ID: [name] Register., male    DOB: 01/30/1941, 82 y.o.   MRN: [id number]  HPI Here with wife to check area on his leg  Cut his right leg twice over a week ago---on the latch for his transport chair Still bleeding at times Hurts a lot Went to [employer] RN today--concern for infection  Current Outpatient Medications on File Prior to Visit  Medication Sig Dispense Refill   acetaminophen (TYLENOL) 325 MG tablet Take 650 mg by mouth every 4 (four) hours as needed.     albuterol (PROVENTIL) (2.5 MG/3ML) 0.083% nebulizer solution Take 2.5 mg by nebulization 2 (two) times daily.     albuterol (VENTOLIN HFA) 108 (90 Base) MCG/ACT inhaler Inhale 2 puffs into the lungs 3 (three) times daily as needed for wheezing or shortness of breath.     ammonium lactate (AMLACTIN) 12 % cream Apply topically at bedtime.     apixaban (ELIQUIS) 5 MG TABS tablet Take 1 tablet (5 mg total) by mouth 2 (two) times daily. 60 tablet 5   Budeson-Glycopyrrol-Formoterol (BREZTRI AEROSPHERE) 160-9-4.8 MCG/ACT AERO Inhale 2 puffs into the lungs 2 (two) times daily. 10.7 g 6   clonazePAM (KLONOPIN) 0.5 MG tablet TAKE 1 TABLET BY MOUTH EVERY DAY AS NEEDED FOR ANXIETY 30 tablet 0   digoxin (LANOXIN) 0.125 MG tablet Take 1 tablet (0.125 mg total) by mouth daily. 90 tablet 3   diltiazem (CARDIZEM CD) 120 MG 24 hr capsule Take 1 capsule (120 mg total) by mouth 2 (two) times daily. 180 capsule 3   finasteride (PROSCAR) 5 MG tablet Take 1 tablet (5 mg total) by mouth daily. 90 tablet 3   furosemide (LASIX) 20 MG tablet Take one tablet (20 mg) by mouth daily with an addition tablet to equal (40 mg) every other day. 90 tablet 3   gabapentin (NEURONTIN) 100 MG capsule Take 100 mg by mouth 2 (two) times daily.     ipratropium (ATROVENT) 0.03 % nasal spray Place 2 sprays into both nostrils every 12 (twelve) hours. 30 mL 12   ipratropium-albuterol (DUONEB) 0.5-2.5 (3) MG/3ML SOLN INHALE 3 ML BY NEBULIZER EVERY 6  HOURS AS NEEDED 360 mL 1   ketoconazole (NIZORAL) 2 % cream Apply topically 2 (two) times daily.     Melatonin 5 MG CAPS Take 5 mg by mouth at bedtime.     nitroGLYCERIN (NITROSTAT) 0.4 MG SL tablet Place 1 tablet (0.4 mg total) under the tongue every 5 (five) minutes as needed for chest pain. 25 tablet 4   OXYGEN Inhale into the lungs. 2 liters upon exertion and 3 liters continuous at night.     pantoprazole (PROTONIX) 40 MG tablet Take 40 mg by mouth daily.     polyethylene glycol (MIRALAX / GLYCOLAX) 17 g packet Take 17 g by mouth as needed.     predniSONE (DELTASONE) 10 MG tablet Take 10 mg by mouth daily with breakfast.     rosuvastatin (CRESTOR) 20 MG tablet TAKE 1 TABLET BY MOUTH EVERYDAY AT BEDTIME 90 tablet 0   silver sulfADIAZINE (SILVADENE) 1 % cream Apply 1 Application topically daily. 50 g 0   No current facility-administered medications on file prior to visit.    Allergies  Allergen Reactions   Flagyl [Metronidazole] Other (See Comments)    Other reaction(s): skin reaction    Past Medical History:  Diagnosis Date   Abdominal discomfort 12/07/2020   Acquired trigger finger of right middle finger 02/04/2020  Acute prostatitis 04/08/2018   Alcohol abuse    Alcohol dependence (HCC) 12/07/2020   Anal or rectal pain 04/08/2018   Annual physical exam 12/07/2020   Anorectal disorder 12/07/2020   Arthritis    Atherosclerotic heart disease of native coronary artery without angina pectoris 04/08/2018   a. 03/2018 PCI: LM nl, LAD 79m, LCX nl, OM1 70p (2.75x8 Synergy DES), 104m (2.5x24 Synergy DES), RCA 25p, RPL1 50. EF 55-65%.   Atrial flutter (HCC)    a. 07/2022 in setting of hospitalization for resp illness/RSV. CHA2DS2VASc = 4-->eliquis; b. 08/2022 s/p DCCV (100J); c. 09/26/2022 Recurrent Aflutter.   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 02/2022 Carotid U/S: 1-39% bilat ICA stenoses.   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   Contracture of palmar fascia 12/07/2020   COPD (chronic obstructive pulmonary disease) (HCC)    Corn of toe 12/07/2020   Corns and callosities 04/09/2018   Coronary artery disease    2019 with stents   Coronary atherosclerosis 05/07/2018   Degenerative disc disease, 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   Degenerative disc disease, 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   Diarrhea 04/09/2018   Double vision with both eyes open 12/07/2020   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   Dysphagia 08/14/2018   Dyspnea    ED (erectile dysfunction)    ED (erectile dysfunction) of organic origin 12/07/2020   Elevated prostate specific antigen (PSA) 04/08/2018   Elevated PSA    Encounter for screening for other disorder 12/07/2020   Enlarged prostate without lower urinary tract symptoms (luts) 04/09/2018   Enthesopathy 12/07/2020   Essential (primary) hypertension 04/08/2018   Ex-smoker 04/09/2018   Exertional dyspnea 04/02/2018   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 07/2022 Echo: EF 65-70%, no rwma, mild LVH, nl RV fxn.   Hyperlipidemia    Hypertension    Hypoxemia 12/07/2020   Kidney cysts    Left knee pain 12/07/2020   Low back pain 04/08/2018   Male erectile dysfunction 04/09/2018   Mixed hyperlipidemia 04/08/2018   Nocturia more than twice per night 04/09/2018   Osteoarthritis of first  carpometacarpal joint 04/08/2018   Osteoarthritis of knee 12/07/2020   Other long term (current) drug therapy 12/07/2020   Pain due to onychomycosis of toenail of left foot 07/20/2019   Pain in joint 04/09/2018   Pain in knee 04/09/2018   Pain in unspecified knee 12/07/2020   Pain of finger 04/09/2018   Palpitations    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HCC) 12/07/2020   Personal history of colonic polyps 12/07/2020   Pneumonia    Pneumothorax 04/12/2020   Polypharmacy 04/09/2018   Porokeratosis 01/13/2019   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HCC)    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   Pulmonary nodules 06/08/2016   Pure hypercholesterolemia 12/07/2020   Renal cyst 12/07/2020   Sleep disorder 04/08/2018   Status post bronchoscopy 04/12/2020   Tear of rotator cuff  04/09/2018   Tobacco user 12/07/2020   Uncomplicated alcohol dependence (HCC) 04/09/2018   Unspecified rotator cuff tear or rupture of unspecified shoulder, not specified as traumatic 12/07/2020   Unspecified tear of unspecified meniscus, current injury, unspecified knee, initial encounter 12/07/2020   Visual disturbance 12/07/2020   Vitamin D deficiency 04/08/2018    Past Surgical History:  Procedure Laterality Date   BRONCHIAL BIOPSY  10/13/2019   Procedure: BRONCHIAL BIOPSIES;  Surgeon: Leslye Peer, MD;  Location: [facility] ENDOSCOPY;  Service: Pulmonary;;   BRONCHIAL BIOPSY  04/12/2020   Procedure: BRONCHIAL BIOPSIES;  Surgeon: Leslye Peer, MD;  Location: [facility] ENDOSCOPY;  Service: Pulmonary;;   BRONCHIAL BRUSHINGS  10/13/2019   Procedure: BRONCHIAL BRUSHINGS;  Surgeon: Leslye Peer, MD;  Location: [facility] ENDOSCOPY;  Service: Pulmonary;;   BRONCHIAL BRUSHINGS  04/12/2020   Procedure: BRONCHIAL BRUSHINGS;  Surgeon: Leslye Peer, MD;  Location: [facility] ENDOSCOPY;  Service: Pulmonary;;   BRONCHIAL NEEDLE ASPIRATION BIOPSY  10/13/2019   Procedure: BRONCHIAL NEEDLE ASPIRATION BIOPSIES;  Surgeon: Leslye Peer, MD;  Location: [facility] ENDOSCOPY;   Service: Pulmonary;;   BRONCHIAL NEEDLE ASPIRATION BIOPSY  04/12/2020   Procedure: BRONCHIAL NEEDLE ASPIRATION BIOPSIES;  Surgeon: Leslye Peer, MD;  Location: [facility] ENDOSCOPY;  Service: Pulmonary;;   BRONCHIAL WASHINGS  10/13/2019   Procedure: BRONCHIAL WASHINGS;  Surgeon: Leslye Peer, MD;  Location: [facility] ENDOSCOPY;  Service: Pulmonary;;   BRONCHIAL WASHINGS  04/12/2020   Procedure: BRONCHIAL WASHINGS;  Surgeon: Leslye Peer, MD;  Location: [facility] ENDOSCOPY;  Service: Pulmonary;;   CARDIAC CATHETERIZATION  2002   50% RCA   CARDIOVERSION N/A 09/17/2022   Procedure: CARDIOVERSION;  Surgeon: Iran Ouch, MD;  Location: [facility] ORS;  Service: Cardiovascular;  Laterality: N/A;   CATARACT EXTRACTION, BILATERAL     CORONARY STENT INTERVENTION N/A 04/15/2018   Procedure: CORONARY STENT INTERVENTION;  Surgeon: Corky Crafts, MD;  Location: [facility] INVASIVE CV LAB;  Service: Cardiovascular;  Laterality: N/A;  om1   EYE SURGERY     KNEE ARTHROSCOPY     LEFT HEART CATH AND CORONARY ANGIOGRAPHY N/A 04/15/2018   Procedure: LEFT HEART CATH AND CORONARY ANGIOGRAPHY;  Surgeon: Corky Crafts, MD;  Location: [facility] INVASIVE CV LAB;  Service: Cardiovascular;  Laterality: N/A;   MULTIPLE TOOTH EXTRACTIONS     TONSILLECTOMY     VIDEO BRONCHOSCOPY  04/12/2020   VIDEO BRONCHOSCOPY WITH ENDOBRONCHIAL NAVIGATION N/A 07/26/2016   Procedure: VIDEO BRONCHOSCOPY WITH ENDOBRONCHIAL NAVIGATION;  Surgeon: Leslye Peer, MD;  Location: [facility] OR;  Service: Thoracic;  Laterality: N/A;   VIDEO BRONCHOSCOPY WITH ENDOBRONCHIAL NAVIGATION N/A 10/13/2019   Procedure: [facility] AND VIDEO BRONCHOSCOPY WITH ENDOBRONCHIAL NAVIGATION;  Surgeon: Leslye Peer, MD;  Location: [facility] ENDOSCOPY;  Service: Pulmonary;  Laterality: N/A;   VIDEO BRONCHOSCOPY WITH ENDOBRONCHIAL NAVIGATION N/A 04/12/2020   Procedure: VIDEO BRONCHOSCOPY WITH ENDOBRONCHIAL NAVIGATION;  Surgeon: Leslye Peer, MD;  Location: [facility] ENDOSCOPY;  Service: Pulmonary;   Laterality: N/A;    Family History  Problem Relation Age of Onset   Hypertension Mother    Alzheimer's disease Mother     Social History   Socioeconomic History   Marital status: Married    Spouse name: Not on file   Number of children: 2   Years of education: Not on file   Highest education level: Not on file  Occupational History   Occupation: Special educational needs teacher, insurance, 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  Tobacco Use   Smoking status: Former  Packs/day: 0.75    Years: 67.00    Additional pack years: 0.00    Total pack years: 50.25    Types: Cigarettes    Start date: 21    Quit date: 07/13/2020    Years since quitting: 2.3    Passive exposure: Never   Smokeless tobacco: Never   Tobacco comments:    Recent Quit    Vaping Use   Vaping Use: Former  Substance and Sexual Activity   Alcohol use: No    Alcohol/week: 0.0 standard drinks of alcohol    Comment: quit drinking 2014   Drug use: No   Sexual activity: Not on file  Other Topics Concern   Not on file  Social History Narrative   Has living will   Wife is health care POA--alternate is son Jeanice Lim)   Has daughter in [place]   Has DNR   No tube feeds if cognitively unaware   Social Determinants of Health   Financial Resource Strain: Not on file  Food Insecurity: No Food Insecurity (08/13/2022)   Hunger Vital Sign    Worried About Running Out of Food in the Last Year: Never true    Ran Out of Food in the Last Year: Never true  Transportation Needs: No Transportation Needs (08/13/2022)   PRAPARE - Administrator, Civil Service (Medical): No    Lack of Transportation (Non-Medical): No  Physical Activity: Not on file  Stress: Not on file  Social Connections: Not on file  Intimate Partner Violence: Not At Risk (08/13/2022)   Humiliation, Afraid, Rape, and Kick questionnaire    Fear of Current or Ex-Partner: No    Emotionally Abused: No    Physically Abused: No    Sexually Abused: No    Review of Systems No fever Not sick    Objective:   Physical Exam Constitutional:      Appearance: Normal appearance.  Skin:    Comments: Vertical skin tear on upper lateral right calf 8cm long--- 1-2 cm wide some granulation--skin around feels warm, slightly red   Neurological:     Mental Status: He is alert.            Assessment & Plan:

## 2022-11-29 NOTE — Assessment & Plan Note (Signed)
Long skin tear with ongoing granulation Possible early infection---will give doxy 100 bid x 7 days----just in case Continue silvadene cream daily

## 2022-12-04 ENCOUNTER — Other Ambulatory Visit: Payer: Self-pay | Admitting: Internal Medicine

## 2022-12-04 DIAGNOSIS — J441 Chronic obstructive pulmonary disease with (acute) exacerbation: Secondary | ICD-10-CM | POA: Diagnosis not present

## 2022-12-04 DIAGNOSIS — Z9981 Dependence on supplemental oxygen: Secondary | ICD-10-CM | POA: Diagnosis not present

## 2022-12-04 DIAGNOSIS — I2510 Atherosclerotic heart disease of native coronary artery without angina pectoris: Secondary | ICD-10-CM | POA: Diagnosis not present

## 2022-12-04 DIAGNOSIS — J431 Panlobular emphysema: Secondary | ICD-10-CM | POA: Diagnosis not present

## 2022-12-04 DIAGNOSIS — I4891 Unspecified atrial fibrillation: Secondary | ICD-10-CM | POA: Diagnosis not present

## 2022-12-04 DIAGNOSIS — J9621 Acute and chronic respiratory failure with hypoxia: Secondary | ICD-10-CM | POA: Diagnosis not present

## 2022-12-04 DIAGNOSIS — I4811 Longstanding persistent atrial fibrillation: Secondary | ICD-10-CM | POA: Diagnosis not present

## 2022-12-04 DIAGNOSIS — M6281 Muscle weakness (generalized): Secondary | ICD-10-CM | POA: Diagnosis not present

## 2022-12-04 DIAGNOSIS — R2689 Other abnormalities of gait and mobility: Secondary | ICD-10-CM | POA: Diagnosis not present

## 2022-12-04 DIAGNOSIS — R278 Other lack of coordination: Secondary | ICD-10-CM | POA: Diagnosis not present

## 2022-12-04 DIAGNOSIS — Z741 Need for assistance with personal care: Secondary | ICD-10-CM | POA: Diagnosis not present

## 2022-12-05 NOTE — Telephone Encounter (Signed)
Last filled 11-07-22 #30 Last OV 11-29-22 Next OV 01-07-23 [facility]

## 2022-12-07 DIAGNOSIS — R2689 Other abnormalities of gait and mobility: Secondary | ICD-10-CM | POA: Diagnosis not present

## 2022-12-07 DIAGNOSIS — R278 Other lack of coordination: Secondary | ICD-10-CM | POA: Diagnosis not present

## 2022-12-07 DIAGNOSIS — I4811 Longstanding persistent atrial fibrillation: Secondary | ICD-10-CM | POA: Diagnosis not present

## 2022-12-07 DIAGNOSIS — J9621 Acute and chronic respiratory failure with hypoxia: Secondary | ICD-10-CM | POA: Diagnosis not present

## 2022-12-07 DIAGNOSIS — J431 Panlobular emphysema: Secondary | ICD-10-CM | POA: Diagnosis not present

## 2022-12-07 DIAGNOSIS — I2510 Atherosclerotic heart disease of native coronary artery without angina pectoris: Secondary | ICD-10-CM | POA: Diagnosis not present

## 2022-12-07 DIAGNOSIS — Z9981 Dependence on supplemental oxygen: Secondary | ICD-10-CM | POA: Diagnosis not present

## 2022-12-07 DIAGNOSIS — I4891 Unspecified atrial fibrillation: Secondary | ICD-10-CM | POA: Diagnosis not present

## 2022-12-07 DIAGNOSIS — Z741 Need for assistance with personal care: Secondary | ICD-10-CM | POA: Diagnosis not present

## 2022-12-07 DIAGNOSIS — J441 Chronic obstructive pulmonary disease with (acute) exacerbation: Secondary | ICD-10-CM | POA: Diagnosis not present

## 2022-12-07 DIAGNOSIS — M6281 Muscle weakness (generalized): Secondary | ICD-10-CM | POA: Diagnosis not present

## 2022-12-15 DIAGNOSIS — J449 Chronic obstructive pulmonary disease, unspecified: Secondary | ICD-10-CM | POA: Diagnosis not present

## 2022-12-15 DIAGNOSIS — J9611 Chronic respiratory failure with hypoxia: Secondary | ICD-10-CM | POA: Diagnosis not present

## 2022-12-17 DIAGNOSIS — Z741 Need for assistance with personal care: Secondary | ICD-10-CM | POA: Diagnosis not present

## 2022-12-17 DIAGNOSIS — R278 Other lack of coordination: Secondary | ICD-10-CM | POA: Diagnosis not present

## 2022-12-17 DIAGNOSIS — J9621 Acute and chronic respiratory failure with hypoxia: Secondary | ICD-10-CM | POA: Diagnosis not present

## 2022-12-17 DIAGNOSIS — Z9981 Dependence on supplemental oxygen: Secondary | ICD-10-CM | POA: Diagnosis not present

## 2022-12-17 DIAGNOSIS — I2510 Atherosclerotic heart disease of native coronary artery without angina pectoris: Secondary | ICD-10-CM | POA: Diagnosis not present

## 2022-12-17 DIAGNOSIS — M6281 Muscle weakness (generalized): Secondary | ICD-10-CM | POA: Diagnosis not present

## 2022-12-17 DIAGNOSIS — I4891 Unspecified atrial fibrillation: Secondary | ICD-10-CM | POA: Diagnosis not present

## 2022-12-17 DIAGNOSIS — I4811 Longstanding persistent atrial fibrillation: Secondary | ICD-10-CM | POA: Diagnosis not present

## 2022-12-17 DIAGNOSIS — J441 Chronic obstructive pulmonary disease with (acute) exacerbation: Secondary | ICD-10-CM | POA: Diagnosis not present

## 2022-12-17 DIAGNOSIS — R2689 Other abnormalities of gait and mobility: Secondary | ICD-10-CM | POA: Diagnosis not present

## 2022-12-17 DIAGNOSIS — J431 Panlobular emphysema: Secondary | ICD-10-CM | POA: Diagnosis not present

## 2022-12-19 DIAGNOSIS — R2689 Other abnormalities of gait and mobility: Secondary | ICD-10-CM | POA: Diagnosis not present

## 2022-12-19 DIAGNOSIS — J9621 Acute and chronic respiratory failure with hypoxia: Secondary | ICD-10-CM | POA: Diagnosis not present

## 2022-12-19 DIAGNOSIS — R278 Other lack of coordination: Secondary | ICD-10-CM | POA: Diagnosis not present

## 2022-12-19 DIAGNOSIS — Z9981 Dependence on supplemental oxygen: Secondary | ICD-10-CM | POA: Diagnosis not present

## 2022-12-19 DIAGNOSIS — J431 Panlobular emphysema: Secondary | ICD-10-CM | POA: Diagnosis not present

## 2022-12-19 DIAGNOSIS — I4811 Longstanding persistent atrial fibrillation: Secondary | ICD-10-CM | POA: Diagnosis not present

## 2022-12-19 DIAGNOSIS — Z741 Need for assistance with personal care: Secondary | ICD-10-CM | POA: Diagnosis not present

## 2022-12-19 DIAGNOSIS — I2510 Atherosclerotic heart disease of native coronary artery without angina pectoris: Secondary | ICD-10-CM | POA: Diagnosis not present

## 2022-12-19 DIAGNOSIS — I4891 Unspecified atrial fibrillation: Secondary | ICD-10-CM | POA: Diagnosis not present

## 2022-12-19 DIAGNOSIS — M6281 Muscle weakness (generalized): Secondary | ICD-10-CM | POA: Diagnosis not present

## 2022-12-19 DIAGNOSIS — J441 Chronic obstructive pulmonary disease with (acute) exacerbation: Secondary | ICD-10-CM | POA: Diagnosis not present

## 2022-12-21 DIAGNOSIS — R278 Other lack of coordination: Secondary | ICD-10-CM | POA: Diagnosis not present

## 2022-12-21 DIAGNOSIS — J9621 Acute and chronic respiratory failure with hypoxia: Secondary | ICD-10-CM | POA: Diagnosis not present

## 2022-12-21 DIAGNOSIS — M6281 Muscle weakness (generalized): Secondary | ICD-10-CM | POA: Diagnosis not present

## 2022-12-21 DIAGNOSIS — I4811 Longstanding persistent atrial fibrillation: Secondary | ICD-10-CM | POA: Diagnosis not present

## 2022-12-21 DIAGNOSIS — J431 Panlobular emphysema: Secondary | ICD-10-CM | POA: Diagnosis not present

## 2022-12-21 DIAGNOSIS — I4891 Unspecified atrial fibrillation: Secondary | ICD-10-CM | POA: Diagnosis not present

## 2022-12-21 DIAGNOSIS — Z9981 Dependence on supplemental oxygen: Secondary | ICD-10-CM | POA: Diagnosis not present

## 2022-12-21 DIAGNOSIS — Z741 Need for assistance with personal care: Secondary | ICD-10-CM | POA: Diagnosis not present

## 2022-12-21 DIAGNOSIS — R2689 Other abnormalities of gait and mobility: Secondary | ICD-10-CM | POA: Diagnosis not present

## 2022-12-21 DIAGNOSIS — J441 Chronic obstructive pulmonary disease with (acute) exacerbation: Secondary | ICD-10-CM | POA: Diagnosis not present

## 2022-12-21 DIAGNOSIS — I2510 Atherosclerotic heart disease of native coronary artery without angina pectoris: Secondary | ICD-10-CM | POA: Diagnosis not present

## 2022-12-22 ENCOUNTER — Encounter: Payer: Self-pay | Admitting: Emergency Medicine

## 2022-12-24 DIAGNOSIS — Z9981 Dependence on supplemental oxygen: Secondary | ICD-10-CM | POA: Diagnosis not present

## 2022-12-24 DIAGNOSIS — Z741 Need for assistance with personal care: Secondary | ICD-10-CM | POA: Diagnosis not present

## 2022-12-24 DIAGNOSIS — J431 Panlobular emphysema: Secondary | ICD-10-CM | POA: Diagnosis not present

## 2022-12-24 DIAGNOSIS — I2510 Atherosclerotic heart disease of native coronary artery without angina pectoris: Secondary | ICD-10-CM | POA: Diagnosis not present

## 2022-12-24 DIAGNOSIS — I4811 Longstanding persistent atrial fibrillation: Secondary | ICD-10-CM | POA: Diagnosis not present

## 2022-12-24 DIAGNOSIS — R2689 Other abnormalities of gait and mobility: Secondary | ICD-10-CM | POA: Diagnosis not present

## 2022-12-24 DIAGNOSIS — M6281 Muscle weakness (generalized): Secondary | ICD-10-CM | POA: Diagnosis not present

## 2022-12-24 DIAGNOSIS — J441 Chronic obstructive pulmonary disease with (acute) exacerbation: Secondary | ICD-10-CM | POA: Diagnosis not present

## 2022-12-24 DIAGNOSIS — R278 Other lack of coordination: Secondary | ICD-10-CM | POA: Diagnosis not present

## 2022-12-24 DIAGNOSIS — J9621 Acute and chronic respiratory failure with hypoxia: Secondary | ICD-10-CM | POA: Diagnosis not present

## 2022-12-24 DIAGNOSIS — I4891 Unspecified atrial fibrillation: Secondary | ICD-10-CM | POA: Diagnosis not present

## 2022-12-26 ENCOUNTER — Other Ambulatory Visit: Payer: Self-pay

## 2022-12-26 DIAGNOSIS — I4811 Longstanding persistent atrial fibrillation: Secondary | ICD-10-CM | POA: Diagnosis not present

## 2022-12-26 DIAGNOSIS — Z741 Need for assistance with personal care: Secondary | ICD-10-CM | POA: Diagnosis not present

## 2022-12-26 DIAGNOSIS — M6281 Muscle weakness (generalized): Secondary | ICD-10-CM | POA: Diagnosis not present

## 2022-12-26 DIAGNOSIS — Z9981 Dependence on supplemental oxygen: Secondary | ICD-10-CM | POA: Diagnosis not present

## 2022-12-26 DIAGNOSIS — J431 Panlobular emphysema: Secondary | ICD-10-CM | POA: Diagnosis not present

## 2022-12-26 DIAGNOSIS — J9621 Acute and chronic respiratory failure with hypoxia: Secondary | ICD-10-CM | POA: Diagnosis not present

## 2022-12-26 DIAGNOSIS — I2510 Atherosclerotic heart disease of native coronary artery without angina pectoris: Secondary | ICD-10-CM | POA: Diagnosis not present

## 2022-12-26 DIAGNOSIS — I4891 Unspecified atrial fibrillation: Secondary | ICD-10-CM | POA: Diagnosis not present

## 2022-12-26 DIAGNOSIS — R2689 Other abnormalities of gait and mobility: Secondary | ICD-10-CM | POA: Diagnosis not present

## 2022-12-26 DIAGNOSIS — J441 Chronic obstructive pulmonary disease with (acute) exacerbation: Secondary | ICD-10-CM | POA: Diagnosis not present

## 2022-12-26 DIAGNOSIS — R278 Other lack of coordination: Secondary | ICD-10-CM | POA: Diagnosis not present

## 2022-12-26 MED ORDER — BREZTRI AEROSPHERE 160-9-4.8 MCG/ACT IN AERO: 2.0000 | INHALATION_SPRAY | Freq: Two times a day (BID) | RESPIRATORY_TRACT | 6 refills | Status: DC

## 2022-12-27 DIAGNOSIS — J431 Panlobular emphysema: Secondary | ICD-10-CM | POA: Diagnosis not present

## 2022-12-27 DIAGNOSIS — R278 Other lack of coordination: Secondary | ICD-10-CM | POA: Diagnosis not present

## 2022-12-27 DIAGNOSIS — I4811 Longstanding persistent atrial fibrillation: Secondary | ICD-10-CM | POA: Diagnosis not present

## 2022-12-27 DIAGNOSIS — Z9981 Dependence on supplemental oxygen: Secondary | ICD-10-CM | POA: Diagnosis not present

## 2022-12-27 DIAGNOSIS — I2510 Atherosclerotic heart disease of native coronary artery without angina pectoris: Secondary | ICD-10-CM | POA: Diagnosis not present

## 2022-12-27 DIAGNOSIS — I4891 Unspecified atrial fibrillation: Secondary | ICD-10-CM | POA: Diagnosis not present

## 2022-12-27 DIAGNOSIS — M6281 Muscle weakness (generalized): Secondary | ICD-10-CM | POA: Diagnosis not present

## 2022-12-27 DIAGNOSIS — J9621 Acute and chronic respiratory failure with hypoxia: Secondary | ICD-10-CM | POA: Diagnosis not present

## 2022-12-27 DIAGNOSIS — R2689 Other abnormalities of gait and mobility: Secondary | ICD-10-CM | POA: Diagnosis not present

## 2022-12-27 DIAGNOSIS — J441 Chronic obstructive pulmonary disease with (acute) exacerbation: Secondary | ICD-10-CM | POA: Diagnosis not present

## 2022-12-27 DIAGNOSIS — Z741 Need for assistance with personal care: Secondary | ICD-10-CM | POA: Diagnosis not present

## 2022-12-31 DIAGNOSIS — I4891 Unspecified atrial fibrillation: Secondary | ICD-10-CM | POA: Diagnosis not present

## 2022-12-31 DIAGNOSIS — R278 Other lack of coordination: Secondary | ICD-10-CM | POA: Diagnosis not present

## 2022-12-31 DIAGNOSIS — I2510 Atherosclerotic heart disease of native coronary artery without angina pectoris: Secondary | ICD-10-CM | POA: Diagnosis not present

## 2022-12-31 DIAGNOSIS — Z741 Need for assistance with personal care: Secondary | ICD-10-CM | POA: Diagnosis not present

## 2022-12-31 DIAGNOSIS — J441 Chronic obstructive pulmonary disease with (acute) exacerbation: Secondary | ICD-10-CM | POA: Diagnosis not present

## 2022-12-31 DIAGNOSIS — I4811 Longstanding persistent atrial fibrillation: Secondary | ICD-10-CM | POA: Diagnosis not present

## 2022-12-31 DIAGNOSIS — M6281 Muscle weakness (generalized): Secondary | ICD-10-CM | POA: Diagnosis not present

## 2022-12-31 DIAGNOSIS — J9621 Acute and chronic respiratory failure with hypoxia: Secondary | ICD-10-CM | POA: Diagnosis not present

## 2022-12-31 DIAGNOSIS — R2689 Other abnormalities of gait and mobility: Secondary | ICD-10-CM | POA: Diagnosis not present

## 2022-12-31 DIAGNOSIS — Z9981 Dependence on supplemental oxygen: Secondary | ICD-10-CM | POA: Diagnosis not present

## 2022-12-31 DIAGNOSIS — J431 Panlobular emphysema: Secondary | ICD-10-CM | POA: Diagnosis not present

## 2023-01-01 DIAGNOSIS — J9621 Acute and chronic respiratory failure with hypoxia: Secondary | ICD-10-CM | POA: Diagnosis not present

## 2023-01-01 DIAGNOSIS — J431 Panlobular emphysema: Secondary | ICD-10-CM | POA: Diagnosis not present

## 2023-01-01 DIAGNOSIS — M6281 Muscle weakness (generalized): Secondary | ICD-10-CM | POA: Diagnosis not present

## 2023-01-01 DIAGNOSIS — R278 Other lack of coordination: Secondary | ICD-10-CM | POA: Diagnosis not present

## 2023-01-01 DIAGNOSIS — I4891 Unspecified atrial fibrillation: Secondary | ICD-10-CM | POA: Diagnosis not present

## 2023-01-01 DIAGNOSIS — Z741 Need for assistance with personal care: Secondary | ICD-10-CM | POA: Diagnosis not present

## 2023-01-01 DIAGNOSIS — J441 Chronic obstructive pulmonary disease with (acute) exacerbation: Secondary | ICD-10-CM | POA: Diagnosis not present

## 2023-01-01 DIAGNOSIS — R2689 Other abnormalities of gait and mobility: Secondary | ICD-10-CM | POA: Diagnosis not present

## 2023-01-01 DIAGNOSIS — Z9981 Dependence on supplemental oxygen: Secondary | ICD-10-CM | POA: Diagnosis not present

## 2023-01-01 DIAGNOSIS — I4811 Longstanding persistent atrial fibrillation: Secondary | ICD-10-CM | POA: Diagnosis not present

## 2023-01-01 DIAGNOSIS — I2510 Atherosclerotic heart disease of native coronary artery without angina pectoris: Secondary | ICD-10-CM | POA: Diagnosis not present

## 2023-01-03 DIAGNOSIS — I4891 Unspecified atrial fibrillation: Secondary | ICD-10-CM | POA: Diagnosis not present

## 2023-01-03 DIAGNOSIS — M6281 Muscle weakness (generalized): Secondary | ICD-10-CM | POA: Diagnosis not present

## 2023-01-03 DIAGNOSIS — R2689 Other abnormalities of gait and mobility: Secondary | ICD-10-CM | POA: Diagnosis not present

## 2023-01-03 DIAGNOSIS — I4811 Longstanding persistent atrial fibrillation: Secondary | ICD-10-CM | POA: Diagnosis not present

## 2023-01-03 DIAGNOSIS — J9621 Acute and chronic respiratory failure with hypoxia: Secondary | ICD-10-CM | POA: Diagnosis not present

## 2023-01-03 DIAGNOSIS — Z9981 Dependence on supplemental oxygen: Secondary | ICD-10-CM | POA: Diagnosis not present

## 2023-01-03 DIAGNOSIS — Z741 Need for assistance with personal care: Secondary | ICD-10-CM | POA: Diagnosis not present

## 2023-01-03 DIAGNOSIS — R278 Other lack of coordination: Secondary | ICD-10-CM | POA: Diagnosis not present

## 2023-01-03 DIAGNOSIS — J441 Chronic obstructive pulmonary disease with (acute) exacerbation: Secondary | ICD-10-CM | POA: Diagnosis not present

## 2023-01-03 DIAGNOSIS — I2510 Atherosclerotic heart disease of native coronary artery without angina pectoris: Secondary | ICD-10-CM | POA: Diagnosis not present

## 2023-01-03 DIAGNOSIS — J431 Panlobular emphysema: Secondary | ICD-10-CM | POA: Diagnosis not present

## 2023-01-04 ENCOUNTER — Ambulatory Visit: Payer: Medicare Other | Admitting: Internal Medicine

## 2023-01-07 ENCOUNTER — Encounter: Payer: Self-pay | Admitting: Internal Medicine

## 2023-01-07 ENCOUNTER — Ambulatory Visit (INDEPENDENT_AMBULATORY_CARE_PROVIDER_SITE_OTHER): Payer: Medicare Other | Admitting: Internal Medicine

## 2023-01-07 VITALS — BP 134/70 | HR 80 | Temp 97.4°F | Ht 70.5 in | Wt 156.0 lb

## 2023-01-07 DIAGNOSIS — J441 Chronic obstructive pulmonary disease with (acute) exacerbation: Secondary | ICD-10-CM | POA: Diagnosis not present

## 2023-01-07 DIAGNOSIS — J449 Chronic obstructive pulmonary disease, unspecified: Secondary | ICD-10-CM | POA: Diagnosis not present

## 2023-01-07 DIAGNOSIS — I739 Peripheral vascular disease, unspecified: Secondary | ICD-10-CM | POA: Diagnosis not present

## 2023-01-07 DIAGNOSIS — Z741 Need for assistance with personal care: Secondary | ICD-10-CM | POA: Diagnosis not present

## 2023-01-07 DIAGNOSIS — J431 Panlobular emphysema: Secondary | ICD-10-CM | POA: Diagnosis not present

## 2023-01-07 DIAGNOSIS — I4891 Unspecified atrial fibrillation: Secondary | ICD-10-CM | POA: Diagnosis not present

## 2023-01-07 DIAGNOSIS — M6281 Muscle weakness (generalized): Secondary | ICD-10-CM | POA: Diagnosis not present

## 2023-01-07 DIAGNOSIS — J9621 Acute and chronic respiratory failure with hypoxia: Secondary | ICD-10-CM | POA: Diagnosis not present

## 2023-01-07 DIAGNOSIS — R2689 Other abnormalities of gait and mobility: Secondary | ICD-10-CM | POA: Diagnosis not present

## 2023-01-07 DIAGNOSIS — I2510 Atherosclerotic heart disease of native coronary artery without angina pectoris: Secondary | ICD-10-CM | POA: Diagnosis not present

## 2023-01-07 DIAGNOSIS — Z9981 Dependence on supplemental oxygen: Secondary | ICD-10-CM | POA: Diagnosis not present

## 2023-01-07 DIAGNOSIS — R278 Other lack of coordination: Secondary | ICD-10-CM | POA: Diagnosis not present

## 2023-01-07 DIAGNOSIS — I480 Paroxysmal atrial fibrillation: Secondary | ICD-10-CM | POA: Diagnosis not present

## 2023-01-07 DIAGNOSIS — I4811 Longstanding persistent atrial fibrillation: Secondary | ICD-10-CM | POA: Diagnosis not present

## 2023-01-07 MED ORDER — APIXABAN 5 MG PO TABS: 5.0000 mg | ORAL_TABLET | Freq: Two times a day (BID) | ORAL | 3 refills | Status: DC

## 2023-01-07 NOTE — Progress Notes (Signed)
Subjective:    Patient ID: [name] Register., male    DOB: 1941-03-03, 82 y.o.   MRN: [id number]  HPI Here for follow up of multiple medical conditions With wife  Skin tear did heal up Toes stay red Occasional burning and pain  Breathing has been rough due to the humidity Doing PT several times a week---at [facility] (residual from last rehab stay) Mostly back to prior functional level  Has help with showers--aide Dresses, bathroom independently Wife handles instrumental ADLs  Current Outpatient Medications on File Prior to Visit  Medication Sig Dispense Refill   acetaminophen (TYLENOL) 325 MG tablet Take 650 mg by mouth every 4 (four) hours as needed.     albuterol (PROVENTIL) (2.5 MG/3ML) 0.083% nebulizer solution Take 2.5 mg by nebulization 2 (two) times daily.     albuterol (VENTOLIN HFA) 108 (90 Base) MCG/ACT inhaler Inhale 2 puffs into the lungs 3 (three) times daily as needed for wheezing or shortness of breath.     ammonium lactate (AMLACTIN) 12 % cream Apply topically at bedtime.     apixaban (ELIQUIS) 5 MG TABS tablet Take 1 tablet (5 mg total) by mouth 2 (two) times daily. 60 tablet 5   Budeson-Glycopyrrol-Formoterol (BREZTRI AEROSPHERE) 160-9-4.8 MCG/ACT AERO Inhale 2 puffs into the lungs 2 (two) times daily. 10.7 g 6   clonazePAM (KLONOPIN) 0.5 MG tablet TAKE 1 TABLET BY MOUTH EVERY DAY AS NEEDED FOR ANXIETY 30 tablet 0   digoxin (LANOXIN) 0.125 MG tablet Take 1 tablet (0.125 mg total) by mouth daily. 90 tablet 3   diltiazem (CARDIZEM CD) 120 MG 24 hr capsule Take 1 capsule (120 mg total) by mouth 2 (two) times daily. 180 capsule 3   finasteride (PROSCAR) 5 MG tablet Take 1 tablet (5 mg total) by mouth daily. 90 tablet 3   furosemide (LASIX) 20 MG tablet Take one tablet (20 mg) by mouth daily with an addition tablet to equal (40 mg) every other day. 90 tablet 3   gabapentin (NEURONTIN) 100 MG capsule Take 100 mg by mouth 2 (two) times daily.     ipratropium  (ATROVENT) 0.03 % nasal spray Place 2 sprays into both nostrils every 12 (twelve) hours. 30 mL 12   ipratropium-albuterol (DUONEB) 0.5-2.5 (3) MG/3ML SOLN INHALE 3 ML BY NEBULIZER EVERY 6 HOURS AS NEEDED 360 mL 1   ketoconazole (NIZORAL) 2 % cream Apply topically 2 (two) times daily.     Melatonin 5 MG CAPS Take 5 mg by mouth at bedtime.     nitroGLYCERIN (NITROSTAT) 0.4 MG SL tablet Place 1 tablet (0.4 mg total) under the tongue every 5 (five) minutes as needed for chest pain. 25 tablet 4   OXYGEN Inhale into the lungs. 2 liters upon exertion and 3 liters continuous at night.     pantoprazole (PROTONIX) 40 MG tablet Take 40 mg by mouth daily.     polyethylene glycol (MIRALAX / GLYCOLAX) 17 g packet Take 17 g by mouth as needed.     predniSONE (DELTASONE) 10 MG tablet Take 10 mg by mouth daily with breakfast.     rosuvastatin (CRESTOR) 20 MG tablet TAKE 1 TABLET BY MOUTH EVERYDAY AT BEDTIME 90 tablet 0   silver sulfADIAZINE (SILVADENE) 1 % cream Apply 1 Application topically daily. (Patient not taking: Reported on 01/07/2023) 50 g 0   No current facility-administered medications on file prior to visit.    Allergies  Allergen Reactions   Flagyl [Metronidazole] Other (See Comments)  Other reaction(s): skin reaction    Past Medical History:  Diagnosis Date   Abdominal discomfort 12/07/2020   Acquired trigger finger of right middle finger 02/04/2020   Acute prostatitis 04/08/2018   Alcohol abuse    Alcohol dependence (HCC) 12/07/2020   Anal or rectal pain 04/08/2018   Annual physical exam 12/07/2020   Anorectal disorder 12/07/2020   Arthritis    Atherosclerotic heart disease of native coronary artery without angina pectoris 04/08/2018   a. 03/2018 PCI: LM nl, LAD 75m, LCX nl, OM1 70p (2.75x8 Synergy DES), 66m (2.5x24 Synergy DES), RCA 25p, RPL1 50. EF 55-65%.   Atrial flutter (HCC)    a. 07/2022 in setting of hospitalization for resp illness/RSV. CHA2DS2VASc = 4-->eliquis; b. 08/2022 s/p  DCCV (100J); c. 09/26/2022 Recurrent Aflutter.   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 02/2022 Carotid U/S: 1-39% bilat ICA stenoses.   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   Contracture of palmar fascia 12/07/2020   COPD (chronic obstructive pulmonary disease) (HCC)    Corn of toe 12/07/2020   Corns and callosities 04/09/2018   Coronary artery disease    2019 with stents   Coronary atherosclerosis 05/07/2018   Degenerative disc disease, 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   Degenerative disc disease, 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   Diarrhea 04/09/2018   Double vision with both eyes open 12/07/2020   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   Dysphagia 08/14/2018   Dyspnea    ED (erectile dysfunction)    ED (erectile dysfunction) of organic origin 12/07/2020   Elevated prostate specific antigen (PSA) 04/08/2018   Elevated PSA    Encounter for screening for other disorder 12/07/2020   Enlarged prostate without lower urinary tract symptoms (luts) 04/09/2018   Enthesopathy 12/07/2020   Essential (primary) hypertension 04/08/2018   Ex-smoker 04/09/2018   Exertional dyspnea 04/02/2018   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 07/2022 Echo: EF 65-70%, no rwma, mild LVH, nl RV fxn.   Hyperlipidemia    Hypertension    Hypoxemia  12/07/2020   Kidney cysts    Left knee pain 12/07/2020   Low back pain 04/08/2018   Male erectile dysfunction 04/09/2018   Mixed hyperlipidemia 04/08/2018   Nocturia more than twice per night 04/09/2018   Osteoarthritis of first carpometacarpal joint 04/08/2018   Osteoarthritis of knee 12/07/2020   Other long term (current) drug therapy 12/07/2020   Pain due to onychomycosis of toenail of left foot 07/20/2019   Pain in joint 04/09/2018   Pain in knee 04/09/2018   Pain in unspecified knee 12/07/2020   Pain of finger 04/09/2018   Palpitations    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HCC) 12/07/2020   Personal history of colonic polyps 12/07/2020   Pneumonia    Pneumothorax 04/12/2020   Polypharmacy 04/09/2018   Porokeratosis 01/13/2019   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HCC)    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  Pulmonary nodules 06/08/2016   Pure hypercholesterolemia 12/07/2020   Renal cyst 12/07/2020   Sleep disorder 04/08/2018   Status post bronchoscopy 04/12/2020   Tear of rotator cuff 04/09/2018   Tobacco user 12/07/2020   Uncomplicated alcohol dependence (HCC) 04/09/2018   Unspecified rotator cuff tear or rupture of unspecified shoulder, not specified as traumatic 12/07/2020   Unspecified tear of unspecified meniscus, current injury, unspecified knee, initial encounter 12/07/2020   Visual disturbance 12/07/2020   Vitamin D deficiency 04/08/2018    Past Surgical History:  Procedure Laterality Date   BRONCHIAL BIOPSY  10/13/2019   Procedure: BRONCHIAL BIOPSIES;  Surgeon: Leslye Peer, MD;  Location: [facility] ENDOSCOPY;  Service: Pulmonary;;   BRONCHIAL BIOPSY  04/12/2020   Procedure: BRONCHIAL BIOPSIES;  Surgeon: Leslye Peer, MD;  Location: [facility] ENDOSCOPY;  Service: Pulmonary;;   BRONCHIAL BRUSHINGS  10/13/2019   Procedure: BRONCHIAL BRUSHINGS;  Surgeon: Leslye Peer, MD;  Location: [facility] ENDOSCOPY;  Service: Pulmonary;;   BRONCHIAL BRUSHINGS  04/12/2020   Procedure: BRONCHIAL BRUSHINGS;   Surgeon: Leslye Peer, MD;  Location: [facility] ENDOSCOPY;  Service: Pulmonary;;   BRONCHIAL NEEDLE ASPIRATION BIOPSY  10/13/2019   Procedure: BRONCHIAL NEEDLE ASPIRATION BIOPSIES;  Surgeon: Leslye Peer, MD;  Location: [facility] ENDOSCOPY;  Service: Pulmonary;;   BRONCHIAL NEEDLE ASPIRATION BIOPSY  04/12/2020   Procedure: BRONCHIAL NEEDLE ASPIRATION BIOPSIES;  Surgeon: Leslye Peer, MD;  Location: [facility] ENDOSCOPY;  Service: Pulmonary;;   BRONCHIAL WASHINGS  10/13/2019   Procedure: BRONCHIAL WASHINGS;  Surgeon: Leslye Peer, MD;  Location: [facility] ENDOSCOPY;  Service: Pulmonary;;   BRONCHIAL WASHINGS  04/12/2020   Procedure: BRONCHIAL WASHINGS;  Surgeon: Leslye Peer, MD;  Location: [facility] ENDOSCOPY;  Service: Pulmonary;;   CARDIAC CATHETERIZATION  2002   50% RCA   CARDIOVERSION N/A 09/17/2022   Procedure: CARDIOVERSION;  Surgeon: Iran Ouch, MD;  Location: [facility] ORS;  Service: Cardiovascular;  Laterality: N/A;   CATARACT EXTRACTION, BILATERAL     CORONARY STENT INTERVENTION N/A 04/15/2018   Procedure: CORONARY STENT INTERVENTION;  Surgeon: Corky Crafts, MD;  Location: [facility] INVASIVE CV LAB;  Service: Cardiovascular;  Laterality: N/A;  om1   EYE SURGERY     KNEE ARTHROSCOPY     LEFT HEART CATH AND CORONARY ANGIOGRAPHY N/A 04/15/2018   Procedure: LEFT HEART CATH AND CORONARY ANGIOGRAPHY;  Surgeon: Corky Crafts, MD;  Location: [facility] INVASIVE CV LAB;  Service: Cardiovascular;  Laterality: N/A;   MULTIPLE TOOTH EXTRACTIONS     TONSILLECTOMY     VIDEO BRONCHOSCOPY  04/12/2020   VIDEO BRONCHOSCOPY WITH ENDOBRONCHIAL NAVIGATION N/A 07/26/2016   Procedure: VIDEO BRONCHOSCOPY WITH ENDOBRONCHIAL NAVIGATION;  Surgeon: Leslye Peer, MD;  Location: [facility] OR;  Service: Thoracic;  Laterality: N/A;   VIDEO BRONCHOSCOPY WITH ENDOBRONCHIAL NAVIGATION N/A 10/13/2019   Procedure: [facility] AND VIDEO BRONCHOSCOPY WITH ENDOBRONCHIAL NAVIGATION;  Surgeon: Leslye Peer, MD;  Location: [facility] ENDOSCOPY;  Service:  Pulmonary;  Laterality: N/A;   VIDEO BRONCHOSCOPY WITH ENDOBRONCHIAL NAVIGATION N/A 04/12/2020   Procedure: VIDEO BRONCHOSCOPY WITH ENDOBRONCHIAL NAVIGATION;  Surgeon: Leslye Peer, MD;  Location: [facility] ENDOSCOPY;  Service: Pulmonary;  Laterality: N/A;    Family History  Problem Relation Age of Onset   Hypertension Mother    Alzheimer's disease Mother     Social History   Socioeconomic History   Marital status: Married    Spouse name: Not on file   Number of children: 2   Years of education: Not on file   Highest  education level: Master's degree (e.g., MA, MS, MEng, MEd, MSW, MBA)  Occupational History   Occupation: Special educational needs teacher, insurance, 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  Tobacco Use   Smoking status: Former    Packs/day: 0.75    Years: 67.00    Additional pack years: 0.00    Total pack years: 50.25    Types: Cigarettes    Start date: 20    Quit date: 07/13/2020    Years since quitting: 2.4    Passive exposure: Never   Smokeless tobacco: Never   Tobacco comments:    Recent Quit    Vaping Use   Vaping Use: Former  Substance and Sexual Activity   Alcohol use: No    Alcohol/week: 0.0 standard drinks of alcohol    Comment: quit drinking 2014   Drug use: No   Sexual activity: Not on file  Other Topics Concern   Not on file  Social History Narrative   Has living will   Wife is health care POA--alternate is son [name] [name])   Has daughter in [place]   Has DNR   No tube feeds if cognitively unaware   Social Determinants of Health   Financial Resource Strain: Low Risk  (01/06/2023)   Overall Financial Resource Strain (CARDIA)    Difficulty of Paying Living Expenses: Not hard at all  Food Insecurity: No Food Insecurity (01/06/2023)   Hunger Vital Sign    Worried About Running Out of Food in the Last Year: Never true    Ran Out of Food in the Last Year: Never true  Transportation Needs: No Transportation Needs (01/06/2023)   PRAPARE - Scientist, research (physical sciences) (Medical): No    Lack of Transportation (Non-Medical): No  Physical Activity: Sufficiently Active (01/06/2023)   Exercise Vital Sign    Days of Exercise per Week: 4 days    Minutes of Exercise per Session: 40 min  Stress: No Stress Concern Present (01/06/2023)   [employer] of Occupational Health - Occupational Stress Questionnaire    Feeling of Stress : Not at all  Social Connections: Unknown (01/06/2023)   Social Connection and Isolation Panel [NHANES]    Frequency of Communication with Friends and Family: Never    Frequency of Social Gatherings with Friends and Family: Once a week    Attends Religious Services: More than 4 times per year    Active Member of [employer] or Organizations: Not on file    Attends Banker Meetings: Not on file    Marital Status: Married  Intimate Partner Violence: Not At Risk (08/13/2022)   Humiliation, Afraid, Rape, and Kick questionnaire    Fear of Current or Ex-Partner: No    Emotionally Abused: No    Physically Abused: No    Sexually Abused: No   Review of Systems Sleeping well Continues on eliquis--but is in donut hole. Interested in getting this internationally for price      Objective:   Physical Exam Constitutional:      Appearance: Normal appearance.  Cardiovascular:     Rate and Rhythm: Normal rate and regular rhythm.     Heart sounds: No murmur heard.    No gallop.     Comments: Faint pedal pulses Pulmonary:     Effort: No respiratory distress.     Breath sounds: No wheezing or rales.     Comments: Little air movement but clear Musculoskeletal:     Comments: Trace toe edema--and right ankle  Skin:  Comments: Slight redness left toes No ulcerations  Neurological:     Mental Status: He is alert.            Assessment & Plan:

## 2023-01-07 NOTE — Assessment & Plan Note (Signed)
Feet/legs are better No ulcers or infection

## 2023-01-07 NOTE — Assessment & Plan Note (Addendum)
Distant heart sounds but seems regular Continues on the apixaban 5 bid On digoxin 0.125mg  daily

## 2023-01-07 NOTE — Assessment & Plan Note (Signed)
Has recovered from last illness Reduced function but back to baseline On breztri

## 2023-01-08 DIAGNOSIS — R2689 Other abnormalities of gait and mobility: Secondary | ICD-10-CM | POA: Diagnosis not present

## 2023-01-08 DIAGNOSIS — Z9981 Dependence on supplemental oxygen: Secondary | ICD-10-CM | POA: Diagnosis not present

## 2023-01-08 DIAGNOSIS — I4891 Unspecified atrial fibrillation: Secondary | ICD-10-CM | POA: Diagnosis not present

## 2023-01-08 DIAGNOSIS — J9621 Acute and chronic respiratory failure with hypoxia: Secondary | ICD-10-CM | POA: Diagnosis not present

## 2023-01-08 DIAGNOSIS — R278 Other lack of coordination: Secondary | ICD-10-CM | POA: Diagnosis not present

## 2023-01-08 DIAGNOSIS — I4811 Longstanding persistent atrial fibrillation: Secondary | ICD-10-CM | POA: Diagnosis not present

## 2023-01-08 DIAGNOSIS — J431 Panlobular emphysema: Secondary | ICD-10-CM | POA: Diagnosis not present

## 2023-01-08 DIAGNOSIS — Z741 Need for assistance with personal care: Secondary | ICD-10-CM | POA: Diagnosis not present

## 2023-01-08 DIAGNOSIS — J441 Chronic obstructive pulmonary disease with (acute) exacerbation: Secondary | ICD-10-CM | POA: Diagnosis not present

## 2023-01-08 DIAGNOSIS — I2510 Atherosclerotic heart disease of native coronary artery without angina pectoris: Secondary | ICD-10-CM | POA: Diagnosis not present

## 2023-01-08 DIAGNOSIS — M6281 Muscle weakness (generalized): Secondary | ICD-10-CM | POA: Diagnosis not present

## 2023-01-10 DIAGNOSIS — I2510 Atherosclerotic heart disease of native coronary artery without angina pectoris: Secondary | ICD-10-CM | POA: Diagnosis not present

## 2023-01-10 DIAGNOSIS — Z741 Need for assistance with personal care: Secondary | ICD-10-CM | POA: Diagnosis not present

## 2023-01-10 DIAGNOSIS — Z9981 Dependence on supplemental oxygen: Secondary | ICD-10-CM | POA: Diagnosis not present

## 2023-01-10 DIAGNOSIS — I4891 Unspecified atrial fibrillation: Secondary | ICD-10-CM | POA: Diagnosis not present

## 2023-01-10 DIAGNOSIS — I4811 Longstanding persistent atrial fibrillation: Secondary | ICD-10-CM | POA: Diagnosis not present

## 2023-01-10 DIAGNOSIS — J9621 Acute and chronic respiratory failure with hypoxia: Secondary | ICD-10-CM | POA: Diagnosis not present

## 2023-01-10 DIAGNOSIS — J441 Chronic obstructive pulmonary disease with (acute) exacerbation: Secondary | ICD-10-CM | POA: Diagnosis not present

## 2023-01-10 DIAGNOSIS — R278 Other lack of coordination: Secondary | ICD-10-CM | POA: Diagnosis not present

## 2023-01-10 DIAGNOSIS — M6281 Muscle weakness (generalized): Secondary | ICD-10-CM | POA: Diagnosis not present

## 2023-01-10 DIAGNOSIS — R2689 Other abnormalities of gait and mobility: Secondary | ICD-10-CM | POA: Diagnosis not present

## 2023-01-10 DIAGNOSIS — J431 Panlobular emphysema: Secondary | ICD-10-CM | POA: Diagnosis not present

## 2023-01-12 NOTE — Progress Notes (Signed)
Cardiology Office Note Date:  01/14/2023  Patient ID:  [name] Destefano., DOB 01/06/1941, MRN [id number] PCP:  [name], MD  Cardiologist:  Lorine Bears, MD Electrophysiologist: Lanier Prude, MD   Chief Complaint: 3 mon Aflutter f/up  History of Present Illness: [name] Warden. is a 82 y.o. male with PMH notable for COPD on home Os, Aflutter, HFpEF, CAD s/p PCI; seen today for Lanier Prude, MD for routine electrophysiology followup.  He last saw Dr. Lalla Brothers 10/10/2022 for initial evaluation of his Aflutter. He was having COPD exacerbation at the time of visit. He had DCCV prior to this visit and had recurrence of AFib within a few weeks. They discussed AAD options, recommended amio vs tikosyn, with preference of tikosyn d/t COPD. The patient was not interested in pursuing tikosyn at the time.  Today, he states that his breathing is much better than when he saw Dr. Lalla Brothers. He does notice that he is only able to walk for about 2-3 minutes before getting SOB, in the near past could walk about 5 minutes before becoming SOB.  He denies palpitations, chest pain, chest pressure.  Diligently takes eliquis BID, no bleeding concerns except for skin tears.   Patient's wife is with him today  AAD History: none  Past Medical History:  Diagnosis Date   Abdominal discomfort 12/07/2020   Acquired trigger finger of right middle finger 02/04/2020   Acute prostatitis 04/08/2018   Alcohol abuse    Alcohol dependence (HCC) 12/07/2020   Anal or rectal pain 04/08/2018   Annual physical exam 12/07/2020   Anorectal disorder 12/07/2020   Arthritis    Atherosclerotic heart disease of native coronary artery without angina pectoris 04/08/2018   a. 03/2018 PCI: LM nl, LAD 81m, LCX nl, OM1 70p (2.75x8 Synergy DES), 68m (2.5x24 Synergy DES), RCA 25p, RPL1 50. EF 55-65%.   Atrial flutter (HCC)    a. 07/2022 in setting of hospitalization for resp illness/RSV. CHA2DS2VASc = 4-->eliquis;  b. 08/2022 s/p DCCV (100J); c. 09/26/2022 Recurrent Aflutter.   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 02/2022 Carotid U/S: 1-39% bilat ICA stenoses.   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   Contracture of palmar fascia 12/07/2020   COPD (chronic obstructive pulmonary disease) (HCC)    Corn of toe 12/07/2020   Corns and callosities 04/09/2018   Coronary artery disease    2019 with stents   Coronary atherosclerosis 05/07/2018   Degenerative disc disease, 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   Degenerative disc disease, 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   Diarrhea 04/09/2018   Double vision with both eyes open 12/07/2020   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   Dysphagia 08/14/2018   Dyspnea    ED (erectile dysfunction)    ED (erectile dysfunction) of organic origin 12/07/2020   Elevated prostate specific antigen (PSA) 04/08/2018   Elevated PSA    Encounter for screening for other disorder 12/07/2020   Enlarged prostate without lower urinary tract symptoms (luts) 04/09/2018   Enthesopathy 12/07/2020   Essential (primary) hypertension 04/08/2018   Ex-smoker 04/09/2018  Exertional dyspnea 04/02/2018   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 07/2022 Echo: EF 65-70%, no rwma, mild LVH, nl RV fxn.   Hyperlipidemia    Hypertension     Hypoxemia 12/07/2020   Kidney cysts    Left knee pain 12/07/2020   Low back pain 04/08/2018   Male erectile dysfunction 04/09/2018   Mixed hyperlipidemia 04/08/2018   Nocturia more than twice per night 04/09/2018   Osteoarthritis of first carpometacarpal joint 04/08/2018   Osteoarthritis of knee 12/07/2020   Other long term (current) drug therapy 12/07/2020   Pain due to onychomycosis of toenail of left foot 07/20/2019   Pain in joint 04/09/2018   Pain in knee 04/09/2018   Pain in unspecified knee 12/07/2020   Pain of finger 04/09/2018   Palpitations    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HCC) 12/07/2020   Personal history of colonic polyps 12/07/2020   Pneumonia    Pneumothorax 04/12/2020   Polypharmacy 04/09/2018   Porokeratosis 01/13/2019   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HCC)    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   Pulmonary nodules 06/08/2016   Pure hypercholesterolemia 12/07/2020   Renal cyst 12/07/2020   Sleep disorder 04/08/2018   Status post bronchoscopy 04/12/2020   Tear of rotator cuff 04/09/2018   Tobacco user 12/07/2020   Uncomplicated alcohol dependence (HCC) 04/09/2018   Unspecified rotator cuff tear or rupture of unspecified shoulder, not specified as traumatic 12/07/2020   Unspecified tear of unspecified meniscus, current injury, unspecified knee, initial encounter 12/07/2020   Visual disturbance 12/07/2020   Vitamin D deficiency 04/08/2018    Past Surgical History:  Procedure Laterality Date   BRONCHIAL BIOPSY  10/13/2019   Procedure: BRONCHIAL BIOPSIES;  Surgeon: Leslye Peer, MD;  [facility] ENDOSCOPY;  Service: Pulmonary;;   BRONCHIAL BIOPSY  04/12/2020   Procedure: BRONCHIAL BIOPSIES;  Surgeon: Leslye Peer, MD;  Location: [facility] ENDOSCOPY;  Service: Pulmonary;;   BRONCHIAL BRUSHINGS  10/13/2019   Procedure: BRONCHIAL BRUSHINGS;  Surgeon: Leslye Peer, MD;  Location: [facility] ENDOSCOPY;  Service: Pulmonary;;   BRONCHIAL BRUSHINGS  04/12/2020   Procedure: BRONCHIAL  BRUSHINGS;  Surgeon: Leslye Peer, MD;  Location: [facility] ENDOSCOPY;  Service: Pulmonary;;   BRONCHIAL NEEDLE ASPIRATION BIOPSY  10/13/2019   Procedure: BRONCHIAL NEEDLE ASPIRATION BIOPSIES;  Surgeon: Leslye Peer, MD;  Location: [facility] ENDOSCOPY;  Service: Pulmonary;;   BRONCHIAL NEEDLE ASPIRATION BIOPSY  04/12/2020   Procedure: BRONCHIAL NEEDLE ASPIRATION BIOPSIES;  Surgeon: Leslye Peer, MD;  Location: [facility] ENDOSCOPY;  Service: Pulmonary;;   BRONCHIAL WASHINGS  10/13/2019   Procedure: BRONCHIAL WASHINGS;  Surgeon: Leslye Peer, MD;  Location: [facility] ENDOSCOPY;  Service: Pulmonary;;   BRONCHIAL WASHINGS  04/12/2020   Procedure: BRONCHIAL WASHINGS;  Surgeon: Leslye Peer, MD;  Location: [facility] ENDOSCOPY;  Service: Pulmonary;;   CARDIAC CATHETERIZATION  2002   50% RCA   CARDIOVERSION N/A 09/17/2022   Procedure: CARDIOVERSION;  Surgeon: Iran Ouch, MD;  Location: [facility] ORS;  Service: Cardiovascular;  Laterality: N/A;   CATARACT EXTRACTION, BILATERAL     CORONARY STENT INTERVENTION N/A 04/15/2018   Procedure: CORONARY STENT INTERVENTION;  Surgeon: Corky Crafts, MD;  Location: [facility] INVASIVE CV LAB;  Service: Cardiovascular;  Laterality: N/A;  om1   EYE SURGERY     KNEE ARTHROSCOPY  LEFT HEART CATH AND CORONARY ANGIOGRAPHY N/A 04/15/2018   Procedure: LEFT HEART CATH AND CORONARY ANGIOGRAPHY;  Surgeon: Corky Crafts, MD;  Location: [facility] INVASIVE CV LAB;  Service: Cardiovascular;  Laterality: N/A;   MULTIPLE TOOTH EXTRACTIONS     TONSILLECTOMY     VIDEO BRONCHOSCOPY  04/12/2020   VIDEO BRONCHOSCOPY WITH ENDOBRONCHIAL NAVIGATION N/A 07/26/2016   Procedure: VIDEO BRONCHOSCOPY WITH ENDOBRONCHIAL NAVIGATION;  Surgeon: Leslye Peer, MD;  Location: [facility] OR;  Service: Thoracic;  Laterality: N/A;   VIDEO BRONCHOSCOPY WITH ENDOBRONCHIAL NAVIGATION N/A 10/13/2019   Procedure: [facility] AND VIDEO BRONCHOSCOPY WITH ENDOBRONCHIAL NAVIGATION;  Surgeon: Leslye Peer, MD;  Location: [facility] ENDOSCOPY;   Service: Pulmonary;  Laterality: N/A;   VIDEO BRONCHOSCOPY WITH ENDOBRONCHIAL NAVIGATION N/A 04/12/2020   Procedure: VIDEO BRONCHOSCOPY WITH ENDOBRONCHIAL NAVIGATION;  Surgeon: Leslye Peer, MD;  Location: [facility] ENDOSCOPY;  Service: Pulmonary;  Laterality: N/A;    Current Outpatient Medications  Medication Instructions   acetaminophen (TYLENOL) 650 mg, Oral, Every 4 hours PRN   albuterol (PROVENTIL) 2.5 mg, Nebulization, 2 times daily   albuterol (VENTOLIN HFA) 108 (90 Base) MCG/ACT inhaler 2 puffs, Inhalation, 3 times daily PRN   ammonium lactate (AMLACTIN) 12 % cream Topical, Daily at bedtime   apixaban (ELIQUIS) 5 mg, Oral, 2 times daily   Budeson-Glycopyrrol-Formoterol (BREZTRI AEROSPHERE) 160-9-4.8 MCG/ACT AERO 2 puffs, Inhalation, 2 times daily   clonazePAM (KLONOPIN) 0.5 MG tablet TAKE 1 TABLET BY MOUTH EVERY DAY AS NEEDED FOR ANXIETY   digoxin (LANOXIN) 0.125 mg, Oral, Daily   diltiazem (CARDIZEM CD) 120 mg, Oral, 2 times daily   finasteride (PROSCAR) 5 mg, Oral, Daily   furosemide (LASIX) 20 MG tablet Take one tablet (20 mg) by mouth daily with an addition tablet to equal (40 mg) every other day.   gabapentin (NEURONTIN) 100 mg, Oral, 2 times daily   ipratropium (ATROVENT) 0.03 % nasal spray 2 sprays, Each Nare, Every 12 hours   ipratropium-albuterol (DUONEB) 0.5-2.5 (3) MG/3ML SOLN INHALE 3 ML BY NEBULIZER EVERY 6 HOURS AS NEEDED   ketoconazole (NIZORAL) 2 % cream Topical, 2 times daily   Melatonin 5 mg, Oral, Daily at bedtime   nitroGLYCERIN (NITROSTAT) 0.4 mg, Sublingual, Every 5 min PRN   OXYGEN Inhalation, 2 liters upon exertion and 3 liters continuous at night.   pantoprazole (PROTONIX) 40 mg, Oral, Daily   polyethylene glycol (MIRALAX / GLYCOLAX) 17 g, Oral, As needed   predniSONE (DELTASONE) 10 mg, Oral, Daily with breakfast   rosuvastatin (CRESTOR) 20 MG tablet TAKE 1 TABLET BY MOUTH EVERYDAY AT BEDTIME   silver sulfADIAZINE (SILVADENE) 1 % cream 1 Application, Topical,  Daily    Social History:  The patient  reports that he quit smoking about 2 years ago. His smoking use included cigarettes. He started smoking about 70 years ago. He has a 50.25 pack-year smoking history. He has never been exposed to tobacco smoke. He has never used smokeless tobacco. He reports that he does not drink alcohol and does not use drugs.   Family History:  The patient's family history includes Alzheimer's disease in his mother; Hypertension in his mother.  ROS:  Please see the history of present illness. All other systems are reviewed and otherwise negative.   PHYSICAL EXAM:  VS:  BP (!) 116/58 (BP Location: Right Arm, Patient Position: Sitting, Cuff Size: Normal)   Pulse 78   Ht 5' 10.5" (1.791 m)   Wt 158 lb (71.7 kg)   SpO2 98% Comment: on  2L O2  BMI 22.35 kg/m  BMI: Body mass index is 22.35 kg/m.  GEN- The patient is well appearing, alert and oriented x 3 today.   Lungs- Diminished in bases bilaterally, normal work of breathing. Elmer in place Heart- Regular rate and rhythm, no murmurs, rubs or gallops Extremities- 1+ peripheral edema, warm, dry   EKG is ordered. Personal review of EKG from today shows: Atypical atrial flutter, 78 bpm; low voltage EKG   Recent Labs: 05/30/2022: Pro B Natriuretic peptide (BNP) 34.0 08/12/2022: TSH 0.700 08/16/2022: Magnesium 2.4 08/30/2022: B Natriuretic Peptide 26.7 09/22/2022: ALT 15; Hemoglobin 11.7; Platelets 368 10/01/2022: BUN 21; Creatinine, Ser 1.04; Potassium 4.2; Sodium 141  08/13/2022: Cholesterol 141; HDL 80; LDL Cholesterol 52; Total CHOL/HDL Ratio 1.8; Triglycerides 46; VLDL 9   CrCl cannot be calculated (Patient's most recent lab result is older than the maximum 21 days allowed.).   Wt Readings from Last 3 Encounters:  01/14/23 158 lb (71.7 kg)  01/07/23 156 lb (70.8 kg)  11/29/22 156 lb (70.8 kg)     Additional studies reviewed include: Previous EP, cardiology notes.   TTE, 08/06/2022  1. Tachycardia noted. Left  ventricular ejection fraction, by estimation, is 65 to 70%. The left ventricle has normal function. The left ventricle has no regional wall motion abnormalities. There is mild left ventricular hypertrophy. Left ventricular diastolic parameters are indeterminate. There is the interventricular septum is flattened in systole and diastole, consistent with right ventricular pressure and volume overload.   2. Right ventricular systolic function is normal. The right ventricular size is normal. Tricuspid regurgitation signal is inadequate for assessing PA pressure.   3. The mitral valve is normal in structure. No evidence of mitral valve regurgitation. No evidence of mitral stenosis.   4. The aortic valve is normal in structure. Aortic valve regurgitation is not visualized. No aortic stenosis is present.   5. The inferior vena cava is normal in size with greater than 50% respiratory variability, suggesting right atrial pressure of 3 mmHg.    ASSESSMENT AND PLAN:  #) parox Aflutter Good ventricular rate control today on digoxin 125 mcg Does not appear to have overt arrhythmia symptoms, but increased SOB w walking could be attributed to rhythm.  Discussed with patient that I will be unable to correlate until he was in normal sinus rhythm for an extended period of time Long discussion with patient and wife regarding limited AAD treatment options.   Tikosyn, unfortunately is his only option with coronary artery disease, HFpEF, and COPD Discussed loading process in hospital, need to take medication q12h, and drug-drug interactions Patient would like to think further about this medication and discussed with pulmonologist and primary care   #) hypercoag d/t AFlutter CHA2DS2-VASc Score = 4 [CHF History: 0, HTN History: 1, Diabetes History: 0, Stroke History: 0, Vascular Disease History: 1, Age Score: 2, Gender Score: 0].  Therefore, the patient's annual risk of stroke is 4.8 %.   NOAC - 5mg  eliquis BID,  appropriately dosed No bleeding concerns   Current medicines are reviewed at length with the patient today.   The patient does not have concerns regarding his medicines.  The following changes were made today:  none  Labs/ tests ordered today include:  Orders Placed This Encounter  Procedures   EKG 12-Lead     Disposition: Follow up with EP APP in in 3 months   Signed, Sherie Don, NP  01/14/23  11:52 AM  Electrophysiology [facility]

## 2023-01-14 ENCOUNTER — Ambulatory Visit: Payer: Medicare Other | Attending: Cardiology | Admitting: Cardiology

## 2023-01-14 ENCOUNTER — Encounter: Payer: Self-pay | Admitting: Internal Medicine

## 2023-01-14 ENCOUNTER — Encounter: Payer: Self-pay | Admitting: Emergency Medicine

## 2023-01-14 ENCOUNTER — Encounter: Payer: Self-pay | Admitting: Cardiology

## 2023-01-14 VITALS — BP 116/58 | HR 78 | Ht 70.5 in | Wt 158.0 lb

## 2023-01-14 DIAGNOSIS — J431 Panlobular emphysema: Secondary | ICD-10-CM | POA: Diagnosis not present

## 2023-01-14 DIAGNOSIS — I480 Paroxysmal atrial fibrillation: Secondary | ICD-10-CM | POA: Diagnosis not present

## 2023-01-14 DIAGNOSIS — R2689 Other abnormalities of gait and mobility: Secondary | ICD-10-CM | POA: Diagnosis not present

## 2023-01-14 DIAGNOSIS — Z741 Need for assistance with personal care: Secondary | ICD-10-CM | POA: Diagnosis not present

## 2023-01-14 DIAGNOSIS — I2510 Atherosclerotic heart disease of native coronary artery without angina pectoris: Secondary | ICD-10-CM | POA: Diagnosis not present

## 2023-01-14 DIAGNOSIS — I483 Typical atrial flutter: Secondary | ICD-10-CM | POA: Diagnosis not present

## 2023-01-14 DIAGNOSIS — D6869 Other thrombophilia: Secondary | ICD-10-CM

## 2023-01-14 DIAGNOSIS — J9621 Acute and chronic respiratory failure with hypoxia: Secondary | ICD-10-CM | POA: Diagnosis not present

## 2023-01-14 DIAGNOSIS — I4811 Longstanding persistent atrial fibrillation: Secondary | ICD-10-CM | POA: Diagnosis not present

## 2023-01-14 DIAGNOSIS — I4891 Unspecified atrial fibrillation: Secondary | ICD-10-CM | POA: Diagnosis not present

## 2023-01-14 DIAGNOSIS — M6281 Muscle weakness (generalized): Secondary | ICD-10-CM | POA: Diagnosis not present

## 2023-01-14 DIAGNOSIS — J441 Chronic obstructive pulmonary disease with (acute) exacerbation: Secondary | ICD-10-CM | POA: Diagnosis not present

## 2023-01-14 DIAGNOSIS — Z9981 Dependence on supplemental oxygen: Secondary | ICD-10-CM | POA: Diagnosis not present

## 2023-01-14 DIAGNOSIS — R278 Other lack of coordination: Secondary | ICD-10-CM | POA: Diagnosis not present

## 2023-01-14 NOTE — Patient Instructions (Signed)
Medication Instructions:  Your physician recommends that you continue on your current medications as directed. Please refer to the Current Medication list given to you today.  *If you need a refill on your cardiac medications before your next appointment, please call your pharmacy*   Lab Work: No labs ordered  If you have labs (blood work) drawn today and your tests are completely normal, you will receive your results only by: MyChart Message (if you have MyChart) OR A paper copy in the mail If you have any lab test that is abnormal or we need to change your treatment, we will call you to review the results.   Testing/Procedures: No testing ordered   Follow-Up: At [facility], you and your health needs are our priority.  As part of our continuing mission to provide you with exceptional heart care, we have created designated Provider Care Teams.  These Care Teams include your primary Cardiologist (physician) and Advanced Practice Providers (APPs -  Physician Assistants and Nurse Practitioners) who all work together to provide you with the care you need, when you need it.  We recommend signing up for the patient portal called "MyChart".  Sign up information is provided on this After Visit Summary.  MyChart is used to connect with patients for Virtual Visits (Telemedicine).  Patients are able to view lab/test results, encounter notes, upcoming appointments, etc.  Non-urgent messages can be sent to your provider as well.   To learn more about what you can do with MyChart, go to [email or web address].    Your next appointment:   3 month(s)  Provider:   Suzann Riddle, NP  

## 2023-01-15 DIAGNOSIS — Z9981 Dependence on supplemental oxygen: Secondary | ICD-10-CM | POA: Diagnosis not present

## 2023-01-15 DIAGNOSIS — I2510 Atherosclerotic heart disease of native coronary artery without angina pectoris: Secondary | ICD-10-CM | POA: Diagnosis not present

## 2023-01-15 DIAGNOSIS — M6281 Muscle weakness (generalized): Secondary | ICD-10-CM | POA: Diagnosis not present

## 2023-01-15 DIAGNOSIS — J9621 Acute and chronic respiratory failure with hypoxia: Secondary | ICD-10-CM | POA: Diagnosis not present

## 2023-01-15 DIAGNOSIS — J449 Chronic obstructive pulmonary disease, unspecified: Secondary | ICD-10-CM | POA: Diagnosis not present

## 2023-01-15 DIAGNOSIS — J431 Panlobular emphysema: Secondary | ICD-10-CM | POA: Diagnosis not present

## 2023-01-15 DIAGNOSIS — R2689 Other abnormalities of gait and mobility: Secondary | ICD-10-CM | POA: Diagnosis not present

## 2023-01-15 DIAGNOSIS — J441 Chronic obstructive pulmonary disease with (acute) exacerbation: Secondary | ICD-10-CM | POA: Diagnosis not present

## 2023-01-15 DIAGNOSIS — R278 Other lack of coordination: Secondary | ICD-10-CM | POA: Diagnosis not present

## 2023-01-15 DIAGNOSIS — J9611 Chronic respiratory failure with hypoxia: Secondary | ICD-10-CM | POA: Diagnosis not present

## 2023-01-15 DIAGNOSIS — I4891 Unspecified atrial fibrillation: Secondary | ICD-10-CM | POA: Diagnosis not present

## 2023-01-15 DIAGNOSIS — I4811 Longstanding persistent atrial fibrillation: Secondary | ICD-10-CM | POA: Diagnosis not present

## 2023-01-15 DIAGNOSIS — Z741 Need for assistance with personal care: Secondary | ICD-10-CM | POA: Diagnosis not present

## 2023-01-16 NOTE — Telephone Encounter (Signed)
Received the following message from patient:   "My heart doctor is suggesting that I begin taking the medication Tikosyn ( dofetilide).    I would like your opinion on how you think this might interfere with my current medications of clonazepam, digoxin, diltiazem and other meds you are prescribing for my COPD. Also, what you think about future treatment with antibiotics for flare ups and respiratory infections.     Thanks.  [name] [name]"  Dr. Delton Coombes, can you please advise? Thanks!

## 2023-01-18 DIAGNOSIS — J441 Chronic obstructive pulmonary disease with (acute) exacerbation: Secondary | ICD-10-CM | POA: Diagnosis not present

## 2023-01-18 DIAGNOSIS — R2689 Other abnormalities of gait and mobility: Secondary | ICD-10-CM | POA: Diagnosis not present

## 2023-01-18 DIAGNOSIS — J431 Panlobular emphysema: Secondary | ICD-10-CM | POA: Diagnosis not present

## 2023-01-18 DIAGNOSIS — Z9981 Dependence on supplemental oxygen: Secondary | ICD-10-CM | POA: Diagnosis not present

## 2023-01-18 DIAGNOSIS — R278 Other lack of coordination: Secondary | ICD-10-CM | POA: Diagnosis not present

## 2023-01-18 DIAGNOSIS — I4811 Longstanding persistent atrial fibrillation: Secondary | ICD-10-CM | POA: Diagnosis not present

## 2023-01-18 DIAGNOSIS — I2510 Atherosclerotic heart disease of native coronary artery without angina pectoris: Secondary | ICD-10-CM | POA: Diagnosis not present

## 2023-01-18 DIAGNOSIS — M6281 Muscle weakness (generalized): Secondary | ICD-10-CM | POA: Diagnosis not present

## 2023-01-18 DIAGNOSIS — Z741 Need for assistance with personal care: Secondary | ICD-10-CM | POA: Diagnosis not present

## 2023-01-18 DIAGNOSIS — I4891 Unspecified atrial fibrillation: Secondary | ICD-10-CM | POA: Diagnosis not present

## 2023-01-18 DIAGNOSIS — J9621 Acute and chronic respiratory failure with hypoxia: Secondary | ICD-10-CM | POA: Diagnosis not present

## 2023-01-21 DIAGNOSIS — M6281 Muscle weakness (generalized): Secondary | ICD-10-CM | POA: Diagnosis not present

## 2023-01-21 DIAGNOSIS — Z741 Need for assistance with personal care: Secondary | ICD-10-CM | POA: Diagnosis not present

## 2023-01-21 DIAGNOSIS — Z9981 Dependence on supplemental oxygen: Secondary | ICD-10-CM | POA: Diagnosis not present

## 2023-01-21 DIAGNOSIS — R278 Other lack of coordination: Secondary | ICD-10-CM | POA: Diagnosis not present

## 2023-01-21 DIAGNOSIS — J9621 Acute and chronic respiratory failure with hypoxia: Secondary | ICD-10-CM | POA: Diagnosis not present

## 2023-01-21 DIAGNOSIS — I4891 Unspecified atrial fibrillation: Secondary | ICD-10-CM | POA: Diagnosis not present

## 2023-01-21 DIAGNOSIS — J431 Panlobular emphysema: Secondary | ICD-10-CM | POA: Diagnosis not present

## 2023-01-21 DIAGNOSIS — I4811 Longstanding persistent atrial fibrillation: Secondary | ICD-10-CM | POA: Diagnosis not present

## 2023-01-21 DIAGNOSIS — I2510 Atherosclerotic heart disease of native coronary artery without angina pectoris: Secondary | ICD-10-CM | POA: Diagnosis not present

## 2023-01-21 DIAGNOSIS — J441 Chronic obstructive pulmonary disease with (acute) exacerbation: Secondary | ICD-10-CM | POA: Diagnosis not present

## 2023-01-21 DIAGNOSIS — R2689 Other abnormalities of gait and mobility: Secondary | ICD-10-CM | POA: Diagnosis not present

## 2023-01-22 DIAGNOSIS — R2689 Other abnormalities of gait and mobility: Secondary | ICD-10-CM | POA: Diagnosis not present

## 2023-01-22 DIAGNOSIS — J441 Chronic obstructive pulmonary disease with (acute) exacerbation: Secondary | ICD-10-CM | POA: Diagnosis not present

## 2023-01-22 DIAGNOSIS — I4891 Unspecified atrial fibrillation: Secondary | ICD-10-CM | POA: Diagnosis not present

## 2023-01-22 DIAGNOSIS — I4811 Longstanding persistent atrial fibrillation: Secondary | ICD-10-CM | POA: Diagnosis not present

## 2023-01-22 DIAGNOSIS — M6281 Muscle weakness (generalized): Secondary | ICD-10-CM | POA: Diagnosis not present

## 2023-01-22 DIAGNOSIS — Z741 Need for assistance with personal care: Secondary | ICD-10-CM | POA: Diagnosis not present

## 2023-01-22 DIAGNOSIS — J431 Panlobular emphysema: Secondary | ICD-10-CM | POA: Diagnosis not present

## 2023-01-22 DIAGNOSIS — Z9981 Dependence on supplemental oxygen: Secondary | ICD-10-CM | POA: Diagnosis not present

## 2023-01-22 DIAGNOSIS — J9621 Acute and chronic respiratory failure with hypoxia: Secondary | ICD-10-CM | POA: Diagnosis not present

## 2023-01-22 DIAGNOSIS — I2510 Atherosclerotic heart disease of native coronary artery without angina pectoris: Secondary | ICD-10-CM | POA: Diagnosis not present

## 2023-01-22 DIAGNOSIS — R278 Other lack of coordination: Secondary | ICD-10-CM | POA: Diagnosis not present

## 2023-01-23 ENCOUNTER — Other Ambulatory Visit: Payer: Self-pay | Admitting: Internal Medicine

## 2023-01-23 DIAGNOSIS — J431 Panlobular emphysema: Secondary | ICD-10-CM

## 2023-01-23 NOTE — Telephone Encounter (Signed)
Last filled 12-05-22 #30 Last OV 11-29-22 Next OV 01-07-23 [facility]

## 2023-01-24 DIAGNOSIS — Z9981 Dependence on supplemental oxygen: Secondary | ICD-10-CM | POA: Diagnosis not present

## 2023-01-24 DIAGNOSIS — Z741 Need for assistance with personal care: Secondary | ICD-10-CM | POA: Diagnosis not present

## 2023-01-24 DIAGNOSIS — R2689 Other abnormalities of gait and mobility: Secondary | ICD-10-CM | POA: Diagnosis not present

## 2023-01-24 DIAGNOSIS — I2510 Atherosclerotic heart disease of native coronary artery without angina pectoris: Secondary | ICD-10-CM | POA: Diagnosis not present

## 2023-01-24 DIAGNOSIS — J431 Panlobular emphysema: Secondary | ICD-10-CM | POA: Diagnosis not present

## 2023-01-24 DIAGNOSIS — J9621 Acute and chronic respiratory failure with hypoxia: Secondary | ICD-10-CM | POA: Diagnosis not present

## 2023-01-24 DIAGNOSIS — I4811 Longstanding persistent atrial fibrillation: Secondary | ICD-10-CM | POA: Diagnosis not present

## 2023-01-24 DIAGNOSIS — M6281 Muscle weakness (generalized): Secondary | ICD-10-CM | POA: Diagnosis not present

## 2023-01-24 DIAGNOSIS — R278 Other lack of coordination: Secondary | ICD-10-CM | POA: Diagnosis not present

## 2023-01-24 DIAGNOSIS — J441 Chronic obstructive pulmonary disease with (acute) exacerbation: Secondary | ICD-10-CM | POA: Diagnosis not present

## 2023-01-24 DIAGNOSIS — I4891 Unspecified atrial fibrillation: Secondary | ICD-10-CM | POA: Diagnosis not present

## 2023-01-28 DIAGNOSIS — J431 Panlobular emphysema: Secondary | ICD-10-CM | POA: Diagnosis not present

## 2023-01-28 DIAGNOSIS — R278 Other lack of coordination: Secondary | ICD-10-CM | POA: Diagnosis not present

## 2023-01-28 DIAGNOSIS — Z9981 Dependence on supplemental oxygen: Secondary | ICD-10-CM | POA: Diagnosis not present

## 2023-01-28 DIAGNOSIS — I2510 Atherosclerotic heart disease of native coronary artery without angina pectoris: Secondary | ICD-10-CM | POA: Diagnosis not present

## 2023-01-28 DIAGNOSIS — I4811 Longstanding persistent atrial fibrillation: Secondary | ICD-10-CM | POA: Diagnosis not present

## 2023-01-28 DIAGNOSIS — Z741 Need for assistance with personal care: Secondary | ICD-10-CM | POA: Diagnosis not present

## 2023-01-28 DIAGNOSIS — J9621 Acute and chronic respiratory failure with hypoxia: Secondary | ICD-10-CM | POA: Diagnosis not present

## 2023-01-28 DIAGNOSIS — M6281 Muscle weakness (generalized): Secondary | ICD-10-CM | POA: Diagnosis not present

## 2023-01-28 DIAGNOSIS — R2689 Other abnormalities of gait and mobility: Secondary | ICD-10-CM | POA: Diagnosis not present

## 2023-01-28 DIAGNOSIS — I4891 Unspecified atrial fibrillation: Secondary | ICD-10-CM | POA: Diagnosis not present

## 2023-01-28 DIAGNOSIS — J441 Chronic obstructive pulmonary disease with (acute) exacerbation: Secondary | ICD-10-CM | POA: Diagnosis not present

## 2023-01-29 ENCOUNTER — Other Ambulatory Visit: Payer: Self-pay | Admitting: Internal Medicine

## 2023-01-29 NOTE — Telephone Encounter (Signed)
Prescription Request  01/29/2023  LOV: 01/07/2023  What is the name of the medication or equipment? predniSONE (DELTASONE) 10 MG tablet   Have you contacted your pharmacy to request a refill? No   Which pharmacy would you like this sent to?  [id number] [address] [place] [address] Phone: [phone] Fax: [phone]  Patient notified that their request is being sent to the clinical staff for review and that they should receive a response within 2 business days.   Please advise at [facility] [phone]  Patient states he has been out of this medication for about 10 days, refill was originally denied and he wanted to know why.

## 2023-01-29 NOTE — Telephone Encounter (Signed)
Last office visit 01/07/23 for Afib, COPD and peripheral vascular disease.  Last refilled ?  Listed as historical medication.  Next Appt: CPE 07/15/2023.

## 2023-02-01 DIAGNOSIS — J441 Chronic obstructive pulmonary disease with (acute) exacerbation: Secondary | ICD-10-CM | POA: Diagnosis not present

## 2023-02-01 DIAGNOSIS — I2510 Atherosclerotic heart disease of native coronary artery without angina pectoris: Secondary | ICD-10-CM | POA: Diagnosis not present

## 2023-02-01 DIAGNOSIS — R278 Other lack of coordination: Secondary | ICD-10-CM | POA: Diagnosis not present

## 2023-02-01 DIAGNOSIS — R2689 Other abnormalities of gait and mobility: Secondary | ICD-10-CM | POA: Diagnosis not present

## 2023-02-01 DIAGNOSIS — Z741 Need for assistance with personal care: Secondary | ICD-10-CM | POA: Diagnosis not present

## 2023-02-01 DIAGNOSIS — M6281 Muscle weakness (generalized): Secondary | ICD-10-CM | POA: Diagnosis not present

## 2023-02-01 DIAGNOSIS — I4811 Longstanding persistent atrial fibrillation: Secondary | ICD-10-CM | POA: Diagnosis not present

## 2023-02-01 DIAGNOSIS — J431 Panlobular emphysema: Secondary | ICD-10-CM | POA: Diagnosis not present

## 2023-02-01 DIAGNOSIS — J9621 Acute and chronic respiratory failure with hypoxia: Secondary | ICD-10-CM | POA: Diagnosis not present

## 2023-02-01 DIAGNOSIS — Z9981 Dependence on supplemental oxygen: Secondary | ICD-10-CM | POA: Diagnosis not present

## 2023-02-01 DIAGNOSIS — I4891 Unspecified atrial fibrillation: Secondary | ICD-10-CM | POA: Diagnosis not present

## 2023-02-02 ENCOUNTER — Encounter: Payer: Self-pay | Admitting: Internal Medicine

## 2023-02-02 ENCOUNTER — Encounter: Payer: Self-pay | Admitting: Emergency Medicine

## 2023-02-04 DIAGNOSIS — I4891 Unspecified atrial fibrillation: Secondary | ICD-10-CM | POA: Diagnosis not present

## 2023-02-04 DIAGNOSIS — Z741 Need for assistance with personal care: Secondary | ICD-10-CM | POA: Diagnosis not present

## 2023-02-04 DIAGNOSIS — R2689 Other abnormalities of gait and mobility: Secondary | ICD-10-CM | POA: Diagnosis not present

## 2023-02-04 DIAGNOSIS — J431 Panlobular emphysema: Secondary | ICD-10-CM | POA: Diagnosis not present

## 2023-02-04 DIAGNOSIS — I4811 Longstanding persistent atrial fibrillation: Secondary | ICD-10-CM | POA: Diagnosis not present

## 2023-02-04 DIAGNOSIS — J441 Chronic obstructive pulmonary disease with (acute) exacerbation: Secondary | ICD-10-CM | POA: Diagnosis not present

## 2023-02-04 DIAGNOSIS — R278 Other lack of coordination: Secondary | ICD-10-CM | POA: Diagnosis not present

## 2023-02-04 DIAGNOSIS — I2510 Atherosclerotic heart disease of native coronary artery without angina pectoris: Secondary | ICD-10-CM | POA: Diagnosis not present

## 2023-02-04 DIAGNOSIS — M6281 Muscle weakness (generalized): Secondary | ICD-10-CM | POA: Diagnosis not present

## 2023-02-04 DIAGNOSIS — J9621 Acute and chronic respiratory failure with hypoxia: Secondary | ICD-10-CM | POA: Diagnosis not present

## 2023-02-04 DIAGNOSIS — Z9981 Dependence on supplemental oxygen: Secondary | ICD-10-CM | POA: Diagnosis not present

## 2023-02-04 MED ORDER — PREDNISONE 10 MG PO TABS: 10.0000 mg | ORAL_TABLET | Freq: Every day | ORAL | 3 refills | Status: DC

## 2023-02-04 NOTE — Telephone Encounter (Signed)
Worthy Rancher, NP denied the request on 01-29-23.

## 2023-02-05 ENCOUNTER — Other Ambulatory Visit: Payer: Self-pay

## 2023-02-05 ENCOUNTER — Telehealth: Payer: Self-pay | Admitting: Pharmacist

## 2023-02-05 NOTE — Telephone Encounter (Signed)
Medication list reviewed in anticipation of upcoming Tikosyn initiation. Patient uses albuterol, Duonebs, and Breztri which can prolong QTc but are ok to continue. He does not take any contraindicated medications.  Patient is anticoagulated on Eliquis 5mg  BID on the appropriate dose. Please ensure that patient has not missed any anticoagulation doses in the 3 weeks prior to Tikosyn initiation.   Patient will need to be counseled to avoid use of Benadryl while on Tikosyn and in the 2-3 days prior to Tikosyn initiation.

## 2023-02-06 ENCOUNTER — Encounter: Payer: Self-pay | Admitting: Internal Medicine

## 2023-02-06 DIAGNOSIS — I2510 Atherosclerotic heart disease of native coronary artery without angina pectoris: Secondary | ICD-10-CM | POA: Diagnosis not present

## 2023-02-06 DIAGNOSIS — R278 Other lack of coordination: Secondary | ICD-10-CM | POA: Diagnosis not present

## 2023-02-06 DIAGNOSIS — R2689 Other abnormalities of gait and mobility: Secondary | ICD-10-CM | POA: Diagnosis not present

## 2023-02-06 DIAGNOSIS — J441 Chronic obstructive pulmonary disease with (acute) exacerbation: Secondary | ICD-10-CM | POA: Diagnosis not present

## 2023-02-06 DIAGNOSIS — J9621 Acute and chronic respiratory failure with hypoxia: Secondary | ICD-10-CM | POA: Diagnosis not present

## 2023-02-06 DIAGNOSIS — J431 Panlobular emphysema: Secondary | ICD-10-CM | POA: Diagnosis not present

## 2023-02-06 DIAGNOSIS — I4811 Longstanding persistent atrial fibrillation: Secondary | ICD-10-CM | POA: Diagnosis not present

## 2023-02-06 DIAGNOSIS — M6281 Muscle weakness (generalized): Secondary | ICD-10-CM | POA: Diagnosis not present

## 2023-02-06 DIAGNOSIS — Z741 Need for assistance with personal care: Secondary | ICD-10-CM | POA: Diagnosis not present

## 2023-02-06 DIAGNOSIS — I4891 Unspecified atrial fibrillation: Secondary | ICD-10-CM | POA: Diagnosis not present

## 2023-02-06 DIAGNOSIS — Z9981 Dependence on supplemental oxygen: Secondary | ICD-10-CM | POA: Diagnosis not present

## 2023-02-11 ENCOUNTER — Encounter: Payer: Self-pay | Admitting: Internal Medicine

## 2023-02-11 ENCOUNTER — Other Ambulatory Visit: Payer: Self-pay | Admitting: *Deleted

## 2023-02-11 DIAGNOSIS — Z9981 Dependence on supplemental oxygen: Secondary | ICD-10-CM | POA: Diagnosis not present

## 2023-02-11 DIAGNOSIS — J441 Chronic obstructive pulmonary disease with (acute) exacerbation: Secondary | ICD-10-CM | POA: Diagnosis not present

## 2023-02-11 DIAGNOSIS — R2689 Other abnormalities of gait and mobility: Secondary | ICD-10-CM | POA: Diagnosis not present

## 2023-02-11 DIAGNOSIS — Z79899 Other long term (current) drug therapy: Secondary | ICD-10-CM

## 2023-02-11 DIAGNOSIS — Z741 Need for assistance with personal care: Secondary | ICD-10-CM | POA: Diagnosis not present

## 2023-02-11 DIAGNOSIS — R278 Other lack of coordination: Secondary | ICD-10-CM | POA: Diagnosis not present

## 2023-02-11 DIAGNOSIS — J431 Panlobular emphysema: Secondary | ICD-10-CM | POA: Diagnosis not present

## 2023-02-11 DIAGNOSIS — J9621 Acute and chronic respiratory failure with hypoxia: Secondary | ICD-10-CM | POA: Diagnosis not present

## 2023-02-11 DIAGNOSIS — I4819 Other persistent atrial fibrillation: Secondary | ICD-10-CM

## 2023-02-11 DIAGNOSIS — I4811 Longstanding persistent atrial fibrillation: Secondary | ICD-10-CM | POA: Diagnosis not present

## 2023-02-11 DIAGNOSIS — M6281 Muscle weakness (generalized): Secondary | ICD-10-CM | POA: Diagnosis not present

## 2023-02-11 DIAGNOSIS — I2510 Atherosclerotic heart disease of native coronary artery without angina pectoris: Secondary | ICD-10-CM | POA: Diagnosis not present

## 2023-02-11 DIAGNOSIS — I4891 Unspecified atrial fibrillation: Secondary | ICD-10-CM | POA: Diagnosis not present

## 2023-02-13 DIAGNOSIS — M6281 Muscle weakness (generalized): Secondary | ICD-10-CM | POA: Diagnosis not present

## 2023-02-13 DIAGNOSIS — I4811 Longstanding persistent atrial fibrillation: Secondary | ICD-10-CM | POA: Diagnosis not present

## 2023-02-13 DIAGNOSIS — J9621 Acute and chronic respiratory failure with hypoxia: Secondary | ICD-10-CM | POA: Diagnosis not present

## 2023-02-13 DIAGNOSIS — I2510 Atherosclerotic heart disease of native coronary artery without angina pectoris: Secondary | ICD-10-CM | POA: Diagnosis not present

## 2023-02-13 DIAGNOSIS — Z9981 Dependence on supplemental oxygen: Secondary | ICD-10-CM | POA: Diagnosis not present

## 2023-02-13 DIAGNOSIS — I4891 Unspecified atrial fibrillation: Secondary | ICD-10-CM | POA: Diagnosis not present

## 2023-02-13 DIAGNOSIS — Z741 Need for assistance with personal care: Secondary | ICD-10-CM | POA: Diagnosis not present

## 2023-02-13 DIAGNOSIS — J441 Chronic obstructive pulmonary disease with (acute) exacerbation: Secondary | ICD-10-CM | POA: Diagnosis not present

## 2023-02-13 DIAGNOSIS — J431 Panlobular emphysema: Secondary | ICD-10-CM | POA: Diagnosis not present

## 2023-02-13 DIAGNOSIS — R278 Other lack of coordination: Secondary | ICD-10-CM | POA: Diagnosis not present

## 2023-02-13 DIAGNOSIS — R2689 Other abnormalities of gait and mobility: Secondary | ICD-10-CM | POA: Diagnosis not present

## 2023-02-14 DIAGNOSIS — J9611 Chronic respiratory failure with hypoxia: Secondary | ICD-10-CM | POA: Diagnosis not present

## 2023-02-14 DIAGNOSIS — J449 Chronic obstructive pulmonary disease, unspecified: Secondary | ICD-10-CM | POA: Diagnosis not present

## 2023-02-15 DIAGNOSIS — I4891 Unspecified atrial fibrillation: Secondary | ICD-10-CM | POA: Diagnosis not present

## 2023-02-15 DIAGNOSIS — J431 Panlobular emphysema: Secondary | ICD-10-CM | POA: Diagnosis not present

## 2023-02-15 DIAGNOSIS — J441 Chronic obstructive pulmonary disease with (acute) exacerbation: Secondary | ICD-10-CM | POA: Diagnosis not present

## 2023-02-15 DIAGNOSIS — M6281 Muscle weakness (generalized): Secondary | ICD-10-CM | POA: Diagnosis not present

## 2023-02-15 DIAGNOSIS — I4811 Longstanding persistent atrial fibrillation: Secondary | ICD-10-CM | POA: Diagnosis not present

## 2023-02-15 DIAGNOSIS — Z9981 Dependence on supplemental oxygen: Secondary | ICD-10-CM | POA: Diagnosis not present

## 2023-02-15 DIAGNOSIS — J9621 Acute and chronic respiratory failure with hypoxia: Secondary | ICD-10-CM | POA: Diagnosis not present

## 2023-02-15 DIAGNOSIS — I2510 Atherosclerotic heart disease of native coronary artery without angina pectoris: Secondary | ICD-10-CM | POA: Diagnosis not present

## 2023-02-15 DIAGNOSIS — R2689 Other abnormalities of gait and mobility: Secondary | ICD-10-CM | POA: Diagnosis not present

## 2023-02-15 DIAGNOSIS — Z741 Need for assistance with personal care: Secondary | ICD-10-CM | POA: Diagnosis not present

## 2023-02-15 DIAGNOSIS — R278 Other lack of coordination: Secondary | ICD-10-CM | POA: Diagnosis not present

## 2023-02-18 ENCOUNTER — Telehealth (INDEPENDENT_AMBULATORY_CARE_PROVIDER_SITE_OTHER): Payer: Medicare Other | Admitting: Podiatry

## 2023-02-18 ENCOUNTER — Ambulatory Visit: Payer: Medicare Other | Admitting: Podiatry

## 2023-02-18 DIAGNOSIS — M79675 Pain in left toe(s): Secondary | ICD-10-CM | POA: Diagnosis not present

## 2023-02-18 DIAGNOSIS — D689 Coagulation defect, unspecified: Secondary | ICD-10-CM | POA: Diagnosis not present

## 2023-02-18 DIAGNOSIS — Z741 Need for assistance with personal care: Secondary | ICD-10-CM | POA: Diagnosis not present

## 2023-02-18 DIAGNOSIS — I4891 Unspecified atrial fibrillation: Secondary | ICD-10-CM | POA: Diagnosis not present

## 2023-02-18 DIAGNOSIS — L03032 Cellulitis of left toe: Secondary | ICD-10-CM

## 2023-02-18 DIAGNOSIS — B351 Tinea unguium: Secondary | ICD-10-CM

## 2023-02-18 DIAGNOSIS — I2510 Atherosclerotic heart disease of native coronary artery without angina pectoris: Secondary | ICD-10-CM | POA: Diagnosis not present

## 2023-02-18 DIAGNOSIS — L600 Ingrowing nail: Secondary | ICD-10-CM

## 2023-02-18 DIAGNOSIS — J9621 Acute and chronic respiratory failure with hypoxia: Secondary | ICD-10-CM | POA: Diagnosis not present

## 2023-02-18 DIAGNOSIS — I4811 Longstanding persistent atrial fibrillation: Secondary | ICD-10-CM | POA: Diagnosis not present

## 2023-02-18 DIAGNOSIS — I739 Peripheral vascular disease, unspecified: Secondary | ICD-10-CM | POA: Diagnosis not present

## 2023-02-18 DIAGNOSIS — M6281 Muscle weakness (generalized): Secondary | ICD-10-CM | POA: Diagnosis not present

## 2023-02-18 DIAGNOSIS — L03031 Cellulitis of right toe: Secondary | ICD-10-CM

## 2023-02-18 DIAGNOSIS — M79674 Pain in right toe(s): Secondary | ICD-10-CM

## 2023-02-18 DIAGNOSIS — J441 Chronic obstructive pulmonary disease with (acute) exacerbation: Secondary | ICD-10-CM | POA: Diagnosis not present

## 2023-02-18 DIAGNOSIS — Z9981 Dependence on supplemental oxygen: Secondary | ICD-10-CM | POA: Diagnosis not present

## 2023-02-18 DIAGNOSIS — J431 Panlobular emphysema: Secondary | ICD-10-CM | POA: Diagnosis not present

## 2023-02-18 DIAGNOSIS — R278 Other lack of coordination: Secondary | ICD-10-CM | POA: Diagnosis not present

## 2023-02-18 DIAGNOSIS — R2689 Other abnormalities of gait and mobility: Secondary | ICD-10-CM | POA: Diagnosis not present

## 2023-02-18 DIAGNOSIS — L02612 Cutaneous abscess of left foot: Secondary | ICD-10-CM

## 2023-02-18 MED ORDER — CEPHALEXIN 500 MG PO CAPS: 500.0000 mg | ORAL_CAPSULE | Freq: Three times a day (TID) | ORAL | 0 refills | Status: DC

## 2023-02-18 NOTE — Telephone Encounter (Signed)
[place] patient: I wrote Rx for Keflex 500 mg po TID for ingrown toenail right great toe. Has h/o cellulitis left foot secondary to infected corn left 2nd toe treated by PCP with antibiotics via injection. Wife wanted to know name of medication as patient is in a research study and has to be admitted to hospital on March 04, 2023, for 3 days. Informed Mrs. Liskey of the name of the medication and she will contact research MD/clinic and let them know. Will let us know if he can take the medication. He will be using the Silvadene Cream on his toe daily. He will follow up with Dr. Allena Katz for toe check of left 2nd toe and right great toe on 02/26/2023.  1. Ingrown right greater toenail   2. Cellulitis and abscess of toe of left foot

## 2023-02-19 ENCOUNTER — Telehealth (INDEPENDENT_AMBULATORY_CARE_PROVIDER_SITE_OTHER): Payer: Medicare Other | Admitting: Podiatry

## 2023-02-19 ENCOUNTER — Telehealth: Payer: Self-pay | Admitting: Podiatry

## 2023-02-19 ENCOUNTER — Other Ambulatory Visit (INDEPENDENT_AMBULATORY_CARE_PROVIDER_SITE_OTHER): Payer: Medicare Other | Admitting: Podiatry

## 2023-02-19 DIAGNOSIS — L03032 Cellulitis of left toe: Secondary | ICD-10-CM

## 2023-02-19 DIAGNOSIS — L600 Ingrowing nail: Secondary | ICD-10-CM

## 2023-02-19 DIAGNOSIS — L02612 Cutaneous abscess of left foot: Secondary | ICD-10-CM

## 2023-02-19 MED ORDER — AMOXICILLIN 500 MG PO CAPS
500.0000 mg | ORAL_CAPSULE | Freq: Three times a day (TID) | ORAL | 0 refills | Status: AC
Start: 2023-02-19 — End: 2023-03-01

## 2023-02-19 NOTE — Telephone Encounter (Signed)
Spoke to wife. Cardiology does not want [name] [name] to take cephalexin. They are okay with him taking PCNs. Sent in Rx for amoxicillin 500 mg po tid x 10 days. Patient will follow up with Dr. Allena Katz in [place] office next week for toe checks of left 2nd toe and right great toe. Wife will pick up Rx for amoxicillin today.  1. Cellulitis and abscess of toe of left foot   2. Ingrown right greater toenail

## 2023-02-19 NOTE — Progress Notes (Signed)
1. Cellulitis and abscess of toe of left foot   2. Ingrown right greater toenail    Patient called.  He was seen recently and placed on cephalexin 500mg .  He stated he is scheduled to go to the hospital in August for some type of Tikosyn regimen with cardiology.  He called them to make sure he can take the Keflex, and they said he cannot, because it can prolong his Q-T interval.  He can't take any meds right now that can do that.  They told him that penicilins are okay.     His callback # [phone]   Discontinue cephalexin prescription.  Meds ordered this encounter  Medications   amoxicillin (AMOXIL) 500 MG capsule    Sig: Take 1 capsule (500 mg total) by mouth 3 (three) times daily for 10 days.    Dispense:  30 capsule    Refill:  0

## 2023-02-19 NOTE — Telephone Encounter (Signed)
Patient called.  He was seen recently and placed on cephalexin 500mg .  He stated he is scheduled to go to the hospital in August for some type of Tikosyn regimen with cardiology.  He called them to make sure he can take the Keflex, and they said he cannot, because it can prolong his Q-T interval.  He can't take any meds right now that can do that.  They told him that penicilins are okay.    His callback # [phone]

## 2023-02-20 DIAGNOSIS — M6281 Muscle weakness (generalized): Secondary | ICD-10-CM | POA: Diagnosis not present

## 2023-02-20 DIAGNOSIS — J9621 Acute and chronic respiratory failure with hypoxia: Secondary | ICD-10-CM | POA: Diagnosis not present

## 2023-02-20 DIAGNOSIS — R2689 Other abnormalities of gait and mobility: Secondary | ICD-10-CM | POA: Diagnosis not present

## 2023-02-20 DIAGNOSIS — R278 Other lack of coordination: Secondary | ICD-10-CM | POA: Diagnosis not present

## 2023-02-20 DIAGNOSIS — J441 Chronic obstructive pulmonary disease with (acute) exacerbation: Secondary | ICD-10-CM | POA: Diagnosis not present

## 2023-02-20 DIAGNOSIS — J431 Panlobular emphysema: Secondary | ICD-10-CM | POA: Diagnosis not present

## 2023-02-20 DIAGNOSIS — I4891 Unspecified atrial fibrillation: Secondary | ICD-10-CM | POA: Diagnosis not present

## 2023-02-20 DIAGNOSIS — Z9981 Dependence on supplemental oxygen: Secondary | ICD-10-CM | POA: Diagnosis not present

## 2023-02-20 DIAGNOSIS — I2510 Atherosclerotic heart disease of native coronary artery without angina pectoris: Secondary | ICD-10-CM | POA: Diagnosis not present

## 2023-02-20 DIAGNOSIS — I4811 Longstanding persistent atrial fibrillation: Secondary | ICD-10-CM | POA: Diagnosis not present

## 2023-02-20 DIAGNOSIS — Z741 Need for assistance with personal care: Secondary | ICD-10-CM | POA: Diagnosis not present

## 2023-02-21 ENCOUNTER — Encounter: Payer: Self-pay | Admitting: Podiatry

## 2023-02-21 NOTE — Progress Notes (Signed)
Subjective:  Patient ID: [name] Register., male    DOB: May 04, 1941,  MRN: [id number]  [name] Register. presents to clinic today for at risk foot care. Patient has h/o PAD and neuropathy and painful porokeratotic lesion(s) left great toe and painful mycotic toenails that limit ambulation. Painful toenails interfere with ambulation. Aggravating factors include wearing enclosed shoe gear. Pain is relieved with periodic professional debridement. Painful porokeratotic lesions are aggravated when weightbearing with and without shoegear. Pain is relieved with periodic professional debridement.  Chief Complaint  Patient presents with   Nail Problem    RFC,Referring Provider Tillman Abide I, MD,lov:06/24      New problem(s): Patient states he was developed a foot infection in February. He was seen by his PCP and received injection of antibiotics. He was also seen at [facility] ED. Infection eventually resolved.   Today, he relates right great toe discomfort of the right great toe lateral border.  He has a lesion on the plantar aspect of the left great toe and uses a pumice stone to file it on occasion.  Patient states he is in a research study with Cardiology.   PCP is [name], MD.  Allergies  Allergen Reactions   Flagyl [Metronidazole] Other (See Comments)    Other reaction(s): skin reaction    Review of Systems: Negative except as noted in the HPI.  Objective: No changes noted in today's physical examination. There were no vitals filed for this visit. [name] Register. is a pleasant 82 y.o. male WD, WN in NAD. AAO x 3.  Vascular Examination: CFT <3 seconds b/l. DP/PT pulses faintly palpable b/l. Skin temperature gradient warm to warm b/l. No pain with calf compression. No ischemia or gangrene. No cyanosis or clubbing noted b/l. Trace edema noted BLE.   Neurological Examination: Sensation grossly intact b/l with 10 gram monofilament.   Dermatological  Examination: Pedal skin warm and supple b/l.   No open wounds. No interdigital macerations.  Toenails 1-5 b/l thick, discolored, elongated with subungual debris and pain on dorsal palpation.    Incurvated nailplate lateral border right hallux.  Nail border hypertrophy minimal. There is tenderness to palpation. Sign(s) of infection: erythema. Porokeratotic lesion(s) plantar IPJ of left great toe. No erythema, no edema, no drainage, no fluctuance.  Musculoskeletal Examination: HAV with bunion deformity noted b/l LE.  Radiographs: None  Last A1c:      Latest Ref Rng & Units 08/13/2022    1:29 AM  Hemoglobin A1C  Hemoglobin-A1c 4.8 - 5.6 % 5.0    Assessment/Plan: 1. Pain due to onychomycosis of toenails of both feet   2. Ingrown right big toenail   3. Cellulitis of great toe, right   4. Coagulation disorder (HCC)   5. 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HCC)     Meds ordered this encounter  Medications   DISCONTD: cephALEXin (KEFLEX) 500 MG capsule    Sig: Take 1 capsule (500 mg total) by mouth 3 (three) times daily.    Dispense:  21 capsule    Refill:  0    -Patient was evaluated and treated. All patient's and/or POA's questions/concerns answered on today's visit. -We discussed his prior left foot infection. His right great toe is symptomatic and has some erythema, so will start him on oral antibiotics. He has Silvadene cream at home and may apply to right great toe once daily. Left 2nd digit has scab dorsally from prior ulceration. Monitor and I will have him see Dr. Allena Katz in  one week to check both feet before his visit with Cardiology for his research study on March 04, 2023. Patient will call Cardiology regarding antibiotic to make sure it's okay for him to take with his research study medication and will notify me. -Porokeratosis left great toe gently filed with bur. -Patient to continue soft, supportive shoe gear daily. -Toenails were debrided in length and girth 2-5 bilaterally and  left great toe with sterile nail nippers and dremel without iatrogenic bleeding.  -No invasive procedure(s) performed. Offending nail border debrided and curretaged right great toe utilizing sterile nail nipper and currette. Border(s) cleansed with alcohol and triple antibiotic ointment applied. Patient/POA/Caregiver/Facility instructed to apply Silvadene cream  to right great toe once daily for 10 days. Call office if there are any concerns. -Patient/POA to call should there be question/concern in the interim.   Return in about 1 week (around 02/25/2023).  Freddie Breech, DPM

## 2023-02-22 DIAGNOSIS — Z741 Need for assistance with personal care: Secondary | ICD-10-CM | POA: Diagnosis not present

## 2023-02-22 DIAGNOSIS — R278 Other lack of coordination: Secondary | ICD-10-CM | POA: Diagnosis not present

## 2023-02-22 DIAGNOSIS — J431 Panlobular emphysema: Secondary | ICD-10-CM | POA: Diagnosis not present

## 2023-02-22 DIAGNOSIS — J9621 Acute and chronic respiratory failure with hypoxia: Secondary | ICD-10-CM | POA: Diagnosis not present

## 2023-02-22 DIAGNOSIS — M6281 Muscle weakness (generalized): Secondary | ICD-10-CM | POA: Diagnosis not present

## 2023-02-22 DIAGNOSIS — Z9981 Dependence on supplemental oxygen: Secondary | ICD-10-CM | POA: Diagnosis not present

## 2023-02-22 DIAGNOSIS — I4891 Unspecified atrial fibrillation: Secondary | ICD-10-CM | POA: Diagnosis not present

## 2023-02-22 DIAGNOSIS — I2510 Atherosclerotic heart disease of native coronary artery without angina pectoris: Secondary | ICD-10-CM | POA: Diagnosis not present

## 2023-02-22 DIAGNOSIS — J441 Chronic obstructive pulmonary disease with (acute) exacerbation: Secondary | ICD-10-CM | POA: Diagnosis not present

## 2023-02-22 DIAGNOSIS — R2689 Other abnormalities of gait and mobility: Secondary | ICD-10-CM | POA: Diagnosis not present

## 2023-02-22 DIAGNOSIS — I4811 Longstanding persistent atrial fibrillation: Secondary | ICD-10-CM | POA: Diagnosis not present

## 2023-02-25 DIAGNOSIS — R2689 Other abnormalities of gait and mobility: Secondary | ICD-10-CM | POA: Diagnosis not present

## 2023-02-25 DIAGNOSIS — I4891 Unspecified atrial fibrillation: Secondary | ICD-10-CM | POA: Diagnosis not present

## 2023-02-25 DIAGNOSIS — J441 Chronic obstructive pulmonary disease with (acute) exacerbation: Secondary | ICD-10-CM | POA: Diagnosis not present

## 2023-02-25 DIAGNOSIS — Z741 Need for assistance with personal care: Secondary | ICD-10-CM | POA: Diagnosis not present

## 2023-02-25 DIAGNOSIS — Z9981 Dependence on supplemental oxygen: Secondary | ICD-10-CM | POA: Diagnosis not present

## 2023-02-25 DIAGNOSIS — I4811 Longstanding persistent atrial fibrillation: Secondary | ICD-10-CM | POA: Diagnosis not present

## 2023-02-25 DIAGNOSIS — J9621 Acute and chronic respiratory failure with hypoxia: Secondary | ICD-10-CM | POA: Diagnosis not present

## 2023-02-25 DIAGNOSIS — I2510 Atherosclerotic heart disease of native coronary artery without angina pectoris: Secondary | ICD-10-CM | POA: Diagnosis not present

## 2023-02-25 DIAGNOSIS — R278 Other lack of coordination: Secondary | ICD-10-CM | POA: Diagnosis not present

## 2023-02-25 DIAGNOSIS — J431 Panlobular emphysema: Secondary | ICD-10-CM | POA: Diagnosis not present

## 2023-02-25 DIAGNOSIS — M6281 Muscle weakness (generalized): Secondary | ICD-10-CM | POA: Diagnosis not present

## 2023-02-26 ENCOUNTER — Ambulatory Visit: Payer: Medicare Other | Admitting: Podiatry

## 2023-02-26 DIAGNOSIS — L97521 Non-pressure chronic ulcer of other part of left foot limited to breakdown of skin: Secondary | ICD-10-CM

## 2023-02-26 DIAGNOSIS — I739 Peripheral vascular disease, unspecified: Secondary | ICD-10-CM

## 2023-02-26 NOTE — Progress Notes (Signed)
Subjective:  Patient ID: [name] Register., male    DOB: 1940-11-25,  MRN: [id number]  Chief Complaint  Patient presents with   Toe Pain    81 y.o. male presents with the above complaint.  Patient presents with left second digit toe ulceration limited to the breakdown of the skin.  Patient was seen by Dr. Donzetta Matters who is managing conservatively but gone the redness around it.  He states has been taking his antibiotics seems to have helped considerably.  He wanted to discuss treatment options to close the ulceration as fast as possible.  He is Miechia applying some Silvadene and a Band-Aid.  He denies any other acute complaints.   Review of Systems: Negative except as noted in the HPI. Denies N/V/F/Ch.  Past Medical History:  Diagnosis Date   Abdominal discomfort 12/07/2020   Acquired trigger finger of right middle finger 02/04/2020   Acute prostatitis 04/08/2018   Alcohol abuse    Alcohol dependence (HCC) 12/07/2020   Anal or rectal pain 04/08/2018   Annual physical exam 12/07/2020   Anorectal disorder 12/07/2020   Arthritis    Atherosclerotic heart disease of native coronary artery without angina pectoris 04/08/2018   a. 03/2018 PCI: LM nl, LAD 97m, LCX nl, OM1 70p (2.75x8 Synergy DES), 43m (2.5x24 Synergy DES), RCA 25p, RPL1 50. EF 55-65%.   Atrial flutter (HCC)    a. 07/2022 in setting of hospitalization for resp illness/RSV. CHA2DS2VASc = 4-->eliquis; b. 08/2022 s/p DCCV (100J); c. 09/26/2022 Recurrent Aflutter.   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 02/2022 Carotid U/S: 1-39% bilat ICA stenoses.   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   Contracture of palmar fascia 12/07/2020   COPD (chronic obstructive pulmonary disease) (HCC)    Corn of toe 12/07/2020   Corns and callosities 04/09/2018   Coronary artery disease    2019 with stents   Coronary atherosclerosis 05/07/2018   Degenerative disc disease, 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   Degenerative disc disease, 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   Diarrhea 04/09/2018   Double vision with both eyes open 12/07/2020   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   Dysphagia 08/14/2018   Dyspnea    ED (erectile dysfunction)    ED (erectile dysfunction) of organic origin 12/07/2020   Elevated prostate specific antigen (PSA) 04/08/2018   Elevated PSA    Encounter for screening for other disorder 12/07/2020   Enlarged prostate without lower urinary tract symptoms (luts) 04/09/2018   Enthesopathy 12/07/2020   Essential (primary) hypertension 04/08/2018   Ex-smoker 04/09/2018   Exertional dyspnea 04/02/2018   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 07/2022 Echo: EF 65-70%, no rwma, mild LVH, nl RV fxn.   Hyperlipidemia    Hypertension    Hypoxemia 12/07/2020   Kidney cysts    Left knee pain 12/07/2020   Low back pain 04/08/2018   Male erectile dysfunction  04/09/2018   Mixed hyperlipidemia 04/08/2018   Nocturia more than twice per night 04/09/2018   Osteoarthritis of first carpometacarpal joint 04/08/2018   Osteoarthritis of knee 12/07/2020   Other long term (current) drug therapy 12/07/2020   Pain due to onychomycosis of toenail of left foot 07/20/2019   Pain in joint 04/09/2018   Pain in knee 04/09/2018   Pain in unspecified knee 12/07/2020   Pain of finger  04/09/2018   Palpitations    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HCC) 12/07/2020   Personal history of colonic polyps 12/07/2020   Pneumonia    Pneumothorax 04/12/2020   Polypharmacy 04/09/2018   Porokeratosis 01/13/2019   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HCC)    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   Pulmonary nodules 06/08/2016   Pure hypercholesterolemia 12/07/2020   Renal cyst 12/07/2020   Sleep disorder 04/08/2018   Status post bronchoscopy 04/12/2020   Tear of rotator cuff 04/09/2018   Tobacco user 12/07/2020   Uncomplicated alcohol dependence (HCC) 04/09/2018   Unspecified rotator cuff tear or rupture of unspecified shoulder, not specified as traumatic 12/07/2020   Unspecified tear of unspecified meniscus, current injury, unspecified knee, initial encounter 12/07/2020   Visual disturbance 12/07/2020   Vitamin D deficiency 04/08/2018    Current Outpatient Medications:    acetaminophen (TYLENOL) 325 MG tablet, Take 650 mg by mouth every 4 (four) hours as needed., Disp: , Rfl:    albuterol (PROVENTIL) (2.5 MG/3ML) 0.083% nebulizer solution, Take 2.5 mg by nebulization 2 (two) times daily., Disp: , Rfl:    albuterol (VENTOLIN HFA) 108 (90 Base) MCG/ACT inhaler, Inhale 2 puffs into the lungs 3 (three) times daily as needed for wheezing or shortness of breath., Disp: , Rfl:    ammonium lactate (AMLACTIN) 12 % cream, Apply topically at bedtime., Disp: , Rfl:    amoxicillin (AMOXIL) 500 MG capsule, Take 1 capsule (500 mg total) by mouth 3 (three) times daily for 10 days., Disp: 30 capsule, Rfl: 0   apixaban (ELIQUIS) 5 MG TABS tablet, Take 1 tablet (5 mg total) by mouth 2 (two) times daily., Disp: 180 tablet, Rfl: 3   Budeson-Glycopyrrol-Formoterol (BREZTRI AEROSPHERE) 160-9-4.8 MCG/ACT AERO, Inhale 2 puffs into the lungs 2 (two) times daily., Disp: 10.7 g, Rfl: 6   clonazePAM (KLONOPIN) 0.5 MG tablet, TAKE 1 TABLET BY MOUTH EVERY DAY AS NEEDED FOR ANXIETY, Disp: 30 tablet, Rfl: 0   digoxin (LANOXIN) 0.125  MG tablet, Take 1 tablet (0.125 mg total) by mouth daily., Disp: 90 tablet, Rfl: 3   diltiazem (CARDIZEM CD) 120 MG 24 hr capsule, Take 1 capsule (120 mg total) by mouth 2 (two) times daily., Disp: 180 capsule, Rfl: 3   finasteride (PROSCAR) 5 MG tablet, Take 1 tablet (5 mg total) by mouth daily., Disp: 90 tablet, Rfl: 3   furosemide (LASIX) 20 MG tablet, Take one tablet (20 mg) by mouth daily with an addition tablet to equal (40 mg) every other day., Disp: 90 tablet, Rfl: 3   gabapentin (NEURONTIN) 100 MG capsule, Take 100 mg by mouth 2 (two) times daily., Disp: , Rfl:    ipratropium (ATROVENT) 0.03 % nasal spray, Place 2 sprays into both nostrils every 12 (twelve) hours., Disp: 30 mL, Rfl: 12   ipratropium-albuterol (DUONEB) 0.5-2.5 (3) MG/3ML SOLN, INHALE 3 ML BY NEBULIZER EVERY 6 HOURS AS NEEDED, Disp: 360 mL, Rfl: 1   ketoconazole (NIZORAL) 2 % cream, Apply topically 2 (two) times daily., Disp: , Rfl:    Melatonin 5 MG CAPS, Take 5 mg by  mouth at bedtime., Disp: , Rfl:    nitroGLYCERIN (NITROSTAT) 0.4 MG SL tablet, Place 1 tablet (0.4 mg total) under the tongue every 5 (five) minutes as needed for chest pain., Disp: 25 tablet, Rfl: 4   OXYGEN, Inhale into the lungs. 2 liters upon exertion and 3 liters continuous at night., Disp: , Rfl:    pantoprazole (PROTONIX) 40 MG tablet, Take 40 mg by mouth daily., Disp: , Rfl:    polyethylene glycol (MIRALAX / GLYCOLAX) 17 g packet, Take 17 g by mouth as needed., Disp: , Rfl:    predniSONE (DELTASONE) 10 MG tablet, Take 1 tablet (10 mg total) by mouth daily with breakfast., Disp: 90 tablet, Rfl: 3   rosuvastatin (CRESTOR) 20 MG tablet, TAKE 1 TABLET BY MOUTH EVERYDAY AT BEDTIME, Disp: 90 tablet, Rfl: 0   silver sulfADIAZINE (SILVADENE) 1 % cream, Apply 1 Application topically daily., Disp: 50 g, Rfl: 0  Social History   Tobacco Use  Smoking Status Former   Current packs/day: 0.00   Average packs/day: 0.8 packs/day for 68.0 years (51.0 ttl pk-yrs)    Types: Cigarettes   Start date: 35   Quit date: 07/13/2020   Years since quitting: 2.6   Passive exposure: Never  Smokeless Tobacco Never  Tobacco Comments   Recent Quit      Allergies  Allergen Reactions   Flagyl [Metronidazole] Other (See Comments)    Other reaction(s): skin reaction   Objective:  There were no vitals filed for this visit. There is no height or weight on file to calculate BMI. Constitutional Well developed. Well nourished.  Vascular Dorsalis pedis pulses palpable bilaterally. Posterior tibial pulses palpable bilaterally. Capillary refill normal to all digits.  No cyanosis or clubbing noted. Pedal hair growth normal.  Neurologic Normal speech. Oriented to person, place, and time. Epicritic sensation to light touch grossly present bilaterally.  Dermatologic Left second digit ulceration limited to the breakdown of skin no purulent drainage noted does not probe down to bone.  No signs of infection noted.  No other osteomyelitic presents noted.  Orthopedic: Normal joint ROM without pain or crepitus bilaterally. No visible deformities. No bony tenderness.   Radiographs: None Assessment:   1. Toe ulcer, left, limited to breakdown of skin (HCC)   2. 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HCC)    Plan:  Patient was evaluated and treated and all questions answered.  Left second digit ulceration limited to the breakdown of the skin -All questions or concerns were discussed with the patient in extensive detail.  I discussed shoe gear modification with more open toed shoe.  She states understand will make adjustment.  I encouraged him to continue using Silvadene cream and a Band-Aid to keep it from getting worse.  I encouraged him to go to the emergency room if there is any clinical signs of infection.  He states understanding  No follow-ups on file.

## 2023-02-27 DIAGNOSIS — J9621 Acute and chronic respiratory failure with hypoxia: Secondary | ICD-10-CM | POA: Diagnosis not present

## 2023-02-27 DIAGNOSIS — J441 Chronic obstructive pulmonary disease with (acute) exacerbation: Secondary | ICD-10-CM | POA: Diagnosis not present

## 2023-02-27 DIAGNOSIS — Z9981 Dependence on supplemental oxygen: Secondary | ICD-10-CM | POA: Diagnosis not present

## 2023-02-27 DIAGNOSIS — R278 Other lack of coordination: Secondary | ICD-10-CM | POA: Diagnosis not present

## 2023-02-27 DIAGNOSIS — R2689 Other abnormalities of gait and mobility: Secondary | ICD-10-CM | POA: Diagnosis not present

## 2023-02-27 DIAGNOSIS — I2510 Atherosclerotic heart disease of native coronary artery without angina pectoris: Secondary | ICD-10-CM | POA: Diagnosis not present

## 2023-02-27 DIAGNOSIS — Z741 Need for assistance with personal care: Secondary | ICD-10-CM | POA: Diagnosis not present

## 2023-02-27 DIAGNOSIS — I4891 Unspecified atrial fibrillation: Secondary | ICD-10-CM | POA: Diagnosis not present

## 2023-02-27 DIAGNOSIS — J431 Panlobular emphysema: Secondary | ICD-10-CM | POA: Diagnosis not present

## 2023-02-27 DIAGNOSIS — I4811 Longstanding persistent atrial fibrillation: Secondary | ICD-10-CM | POA: Diagnosis not present

## 2023-02-27 DIAGNOSIS — M6281 Muscle weakness (generalized): Secondary | ICD-10-CM | POA: Diagnosis not present

## 2023-02-28 ENCOUNTER — Other Ambulatory Visit: Payer: Self-pay | Admitting: Internal Medicine

## 2023-02-28 DIAGNOSIS — J431 Panlobular emphysema: Secondary | ICD-10-CM

## 2023-02-28 NOTE — Telephone Encounter (Signed)
Last filled 01-23-23 #30 Last OV 11-29-22 Next OV 01-07-23 [facility]

## 2023-03-01 ENCOUNTER — Encounter (HOSPITAL_COMMUNITY): Payer: Self-pay

## 2023-03-01 DIAGNOSIS — I4811 Longstanding persistent atrial fibrillation: Secondary | ICD-10-CM | POA: Diagnosis not present

## 2023-03-01 DIAGNOSIS — J9621 Acute and chronic respiratory failure with hypoxia: Secondary | ICD-10-CM | POA: Diagnosis not present

## 2023-03-01 DIAGNOSIS — Z741 Need for assistance with personal care: Secondary | ICD-10-CM | POA: Diagnosis not present

## 2023-03-01 DIAGNOSIS — M6281 Muscle weakness (generalized): Secondary | ICD-10-CM | POA: Diagnosis not present

## 2023-03-01 DIAGNOSIS — Z9981 Dependence on supplemental oxygen: Secondary | ICD-10-CM | POA: Diagnosis not present

## 2023-03-01 DIAGNOSIS — I2510 Atherosclerotic heart disease of native coronary artery without angina pectoris: Secondary | ICD-10-CM | POA: Diagnosis not present

## 2023-03-01 DIAGNOSIS — J441 Chronic obstructive pulmonary disease with (acute) exacerbation: Secondary | ICD-10-CM | POA: Diagnosis not present

## 2023-03-01 DIAGNOSIS — I4891 Unspecified atrial fibrillation: Secondary | ICD-10-CM | POA: Diagnosis not present

## 2023-03-01 DIAGNOSIS — J431 Panlobular emphysema: Secondary | ICD-10-CM | POA: Diagnosis not present

## 2023-03-01 DIAGNOSIS — R2689 Other abnormalities of gait and mobility: Secondary | ICD-10-CM | POA: Diagnosis not present

## 2023-03-01 DIAGNOSIS — R278 Other lack of coordination: Secondary | ICD-10-CM | POA: Diagnosis not present

## 2023-03-04 ENCOUNTER — Ambulatory Visit (HOSPITAL_COMMUNITY)
Admission: RE | Admit: 2023-03-04 | Discharge: 2023-03-04 | Disposition: A | Discharge status: Home / Self Care | Payer: Medicare Other | Source: Ambulatory Visit | Attending: Internal Medicine | Admitting: Internal Medicine

## 2023-03-04 ENCOUNTER — Inpatient Hospital Stay (HOSPITAL_COMMUNITY)
Admission: AD | Admit: 2023-03-04 | Discharge: 2023-03-07 | DRG: 309 | Disposition: A | Discharge status: Home / Self Care | Payer: Medicare Other | Source: Ambulatory Visit | Attending: Cardiology | Admitting: Cardiology

## 2023-03-04 ENCOUNTER — Other Ambulatory Visit: Payer: Self-pay

## 2023-03-04 ENCOUNTER — Encounter (HOSPITAL_COMMUNITY): Payer: Self-pay | Admitting: Cardiovascular Disease

## 2023-03-04 ENCOUNTER — Encounter (HOSPITAL_COMMUNITY): Payer: Self-pay | Admitting: Internal Medicine

## 2023-03-04 ENCOUNTER — Other Ambulatory Visit (HOSPITAL_COMMUNITY): Payer: Self-pay

## 2023-03-04 VITALS — BP 112/62 | HR 75 | Ht 70.5 in | Wt 158.6 lb

## 2023-03-04 DIAGNOSIS — I440 Atrioventricular block, first degree: Secondary | ICD-10-CM | POA: Diagnosis not present

## 2023-03-04 DIAGNOSIS — I480 Paroxysmal atrial fibrillation: Secondary | ICD-10-CM

## 2023-03-04 DIAGNOSIS — K219 Gastro-esophageal reflux disease without esophagitis: Secondary | ICD-10-CM | POA: Diagnosis not present

## 2023-03-04 DIAGNOSIS — Z9861 Coronary angioplasty status: Secondary | ICD-10-CM | POA: Diagnosis not present

## 2023-03-04 DIAGNOSIS — I739 Peripheral vascular disease, unspecified: Secondary | ICD-10-CM | POA: Diagnosis not present

## 2023-03-04 DIAGNOSIS — I2510 Atherosclerotic heart disease of native coronary artery without angina pectoris: Secondary | ICD-10-CM | POA: Diagnosis present

## 2023-03-04 DIAGNOSIS — I4891 Unspecified atrial fibrillation: Secondary | ICD-10-CM | POA: Diagnosis not present

## 2023-03-04 DIAGNOSIS — I708 Atherosclerosis of other arteries: Secondary | ICD-10-CM | POA: Diagnosis present

## 2023-03-04 DIAGNOSIS — E785 Hyperlipidemia, unspecified: Secondary | ICD-10-CM | POA: Diagnosis not present

## 2023-03-04 DIAGNOSIS — Z79899 Other long term (current) drug therapy: Secondary | ICD-10-CM | POA: Insufficient documentation

## 2023-03-04 DIAGNOSIS — Z7901 Long term (current) use of anticoagulants: Secondary | ICD-10-CM

## 2023-03-04 DIAGNOSIS — I509 Heart failure, unspecified: Secondary | ICD-10-CM | POA: Insufficient documentation

## 2023-03-04 DIAGNOSIS — I110 Hypertensive heart disease with heart failure: Secondary | ICD-10-CM | POA: Insufficient documentation

## 2023-03-04 DIAGNOSIS — Z7952 Long term (current) use of systemic steroids: Secondary | ICD-10-CM

## 2023-03-04 DIAGNOSIS — Z9981 Dependence on supplemental oxygen: Secondary | ICD-10-CM | POA: Diagnosis not present

## 2023-03-04 DIAGNOSIS — I5032 Chronic diastolic (congestive) heart failure: Secondary | ICD-10-CM | POA: Diagnosis not present

## 2023-03-04 DIAGNOSIS — I4819 Other persistent atrial fibrillation: Secondary | ICD-10-CM

## 2023-03-04 DIAGNOSIS — I484 Atypical atrial flutter: Secondary | ICD-10-CM

## 2023-03-04 DIAGNOSIS — D6869 Other thrombophilia: Secondary | ICD-10-CM | POA: Insufficient documentation

## 2023-03-04 DIAGNOSIS — J449 Chronic obstructive pulmonary disease, unspecified: Secondary | ICD-10-CM | POA: Diagnosis not present

## 2023-03-04 DIAGNOSIS — I4892 Unspecified atrial flutter: Principal | ICD-10-CM | POA: Diagnosis present

## 2023-03-04 LAB — BASIC METABOLIC PANEL
Anion gap: 10 (ref 5–15)
BUN: 29 mg/dL — ABNORMAL HIGH (ref 8–23)
CO2: 29 mmol/L (ref 22–32)
Calcium: 8.8 mg/dL — ABNORMAL LOW (ref 8.9–10.3)
Chloride: 101 mmol/L (ref 98–111)
Creatinine, Ser: 1.4 mg/dL — ABNORMAL HIGH (ref 0.61–1.24)
GFR, Estimated: 50 mL/min — ABNORMAL LOW (ref >60)
Glucose, Bld: 93 mg/dL (ref 70–99)
Potassium: 4.4 mmol/L (ref 3.5–5.1)
Sodium: 140 mmol/L (ref 135–145)

## 2023-03-04 LAB — MAGNESIUM: Magnesium: 2.1 mg/dL (ref 1.7–2.4)

## 2023-03-04 MED ORDER — SODIUM CHLORIDE 0.9 % IV SOLN: 250.0000 mL | INTRAVENOUS | Status: DC | PRN

## 2023-03-04 MED ORDER — FUROSEMIDE 20 MG PO TABS
20.0000 mg | ORAL_TABLET | Freq: Every day | ORAL | Status: DC
  Administered 2023-03-05 – 2023-03-07 (×3): 20 mg via ORAL
  Filled 2023-03-04 (×3): qty 1

## 2023-03-04 MED ORDER — PANTOPRAZOLE SODIUM 40 MG PO TBEC
40.0000 mg | DELAYED_RELEASE_TABLET | Freq: Every day | ORAL | Status: DC
  Administered 2023-03-05 – 2023-03-07 (×3): 40 mg via ORAL
  Filled 2023-03-04 (×3): qty 1

## 2023-03-04 MED ORDER — NITROGLYCERIN 0.4 MG SL SUBL: 0.4000 mg | SUBLINGUAL_TABLET | SUBLINGUAL | Status: DC | PRN

## 2023-03-04 MED ORDER — DOCUSATE SODIUM 100 MG PO CAPS
100.0000 mg | ORAL_CAPSULE | Freq: Every day | ORAL | Status: DC
  Administered 2023-03-04 – 2023-03-06 (×3): 100 mg via ORAL
  Filled 2023-03-04 (×3): qty 1

## 2023-03-04 MED ORDER — DOFETILIDE 250 MCG PO CAPS
250.0000 ug | ORAL_CAPSULE | Freq: Two times a day (BID) | ORAL | Status: DC
  Administered 2023-03-04 – 2023-03-07 (×6): 250 ug via ORAL
  Filled 2023-03-04 (×6): qty 1

## 2023-03-04 MED ORDER — GABAPENTIN 100 MG PO CAPS
200.0000 mg | ORAL_CAPSULE | Freq: Every day | ORAL | Status: DC
  Administered 2023-03-04 – 2023-03-06 (×3): 200 mg via ORAL
  Filled 2023-03-04 (×3): qty 2

## 2023-03-04 MED ORDER — PREDNISONE 10 MG PO TABS
10.0000 mg | ORAL_TABLET | Freq: Every day | ORAL | Status: DC
  Administered 2023-03-05 – 2023-03-07 (×3): 10 mg via ORAL
  Filled 2023-03-04 (×3): qty 1

## 2023-03-04 MED ORDER — GABAPENTIN 100 MG PO CAPS: 100.0000 mg | ORAL_CAPSULE | Freq: Two times a day (BID) | ORAL | Status: DC

## 2023-03-04 MED ORDER — DIGOXIN 125 MCG PO TABS
0.1250 mg | ORAL_TABLET | Freq: Every day | ORAL | Status: DC
  Administered 2023-03-05: 0.125 mg via ORAL
  Filled 2023-03-04: qty 1

## 2023-03-04 MED ORDER — BUDESON-GLYCOPYRROL-FORMOTEROL 160-9-4.8 MCG/ACT IN AERO
2.0000 | INHALATION_SPRAY | RESPIRATORY_TRACT | Status: DC
  Administered 2023-03-04 – 2023-03-07 (×6): 2 via RESPIRATORY_TRACT
  Filled 2023-03-04 (×4): qty 0.42

## 2023-03-04 MED ORDER — ACETAMINOPHEN 325 MG PO TABS: 650.0000 mg | ORAL_TABLET | ORAL | Status: DC | PRN

## 2023-03-04 MED ORDER — ALBUTEROL SULFATE (2.5 MG/3ML) 0.083% IN NEBU: 2.5000 mg | INHALATION_SOLUTION | Freq: Three times a day (TID) | RESPIRATORY_TRACT | Status: DC | PRN

## 2023-03-04 MED ORDER — MELATONIN 3 MG PO TABS
3.0000 mg | ORAL_TABLET | Freq: Every day | ORAL | Status: DC
  Filled 2023-03-04 (×2): qty 1

## 2023-03-04 MED ORDER — CLONAZEPAM 0.5 MG PO TABS
0.5000 mg | ORAL_TABLET | Freq: Every day | ORAL | Status: DC
  Administered 2023-03-04 – 2023-03-06 (×3): 0.5 mg via ORAL
  Filled 2023-03-04 (×3): qty 1

## 2023-03-04 MED ORDER — DILTIAZEM HCL ER COATED BEADS 120 MG PO CP24
120.0000 mg | ORAL_CAPSULE | Freq: Two times a day (BID) | ORAL | Status: DC
  Administered 2023-03-04 – 2023-03-07 (×6): 120 mg via ORAL
  Filled 2023-03-04 (×6): qty 1

## 2023-03-04 MED ORDER — APIXABAN 5 MG PO TABS
5.0000 mg | ORAL_TABLET | Freq: Two times a day (BID) | ORAL | Status: DC
  Administered 2023-03-04 – 2023-03-07 (×6): 5 mg via ORAL
  Filled 2023-03-04 (×6): qty 1

## 2023-03-04 MED ORDER — ROSUVASTATIN CALCIUM 20 MG PO TABS
20.0000 mg | ORAL_TABLET | Freq: Every day | ORAL | Status: DC
  Administered 2023-03-04 – 2023-03-06 (×3): 20 mg via ORAL
  Filled 2023-03-04 (×3): qty 1

## 2023-03-04 MED ORDER — DIGOXIN 125 MCG PO TABS: 0.1250 mg | ORAL_TABLET | Freq: Every day | ORAL | Status: DC

## 2023-03-04 MED ORDER — IPRATROPIUM-ALBUTEROL 0.5-2.5 (3) MG/3ML IN SOLN: 3.0000 mL | Freq: Four times a day (QID) | RESPIRATORY_TRACT | Status: DC | PRN

## 2023-03-04 MED ORDER — SODIUM CHLORIDE 0.9% FLUSH
3.0000 mL | Freq: Two times a day (BID) | INTRAVENOUS | Status: DC
  Administered 2023-03-04 – 2023-03-07 (×6): 3 mL via INTRAVENOUS

## 2023-03-04 MED ORDER — SODIUM CHLORIDE 0.9% FLUSH: 3.0000 mL | INTRAVENOUS | Status: DC | PRN

## 2023-03-04 NOTE — Progress Notes (Signed)
Pharmacy: Dofetilide (Tikosyn) - Initial Consult Assessment and Electrolyte Replacement  Pharmacy consulted to assist in monitoring and replacing electrolytes in this 82 y.o. male admitted on 03/04/2023 undergoing dofetilide initiation. First dofetilide dose: planned 8/5 at 8 pm  Assessment:  Patient Exclusion Criteria: If any screening criteria checked as "Yes", then  patient  should NOT receive dofetilide until criteria item is corrected.  If "Yes" please indicate correction plan.  YES  NO Patient  Exclusion Criteria Correction Plan   []   [x]   Baseline QTc interval is greater than or equal to 440 msec. IF above YES box checked dofetilide contraindicated unless patient has ICD; then may proceed if QTc 500-550 msec or with known ventricular conduction abnormalities may proceed with QTc 550-600 msec. QTc = 0.42    []   [x]   Patient is known or suspected to have a digoxin level greater than 2 ng/ml: Lab Results  Component Value Date   DIGOXIN 0.6 (L) 10/01/2022       []   [x]   Creatinine clearance less than 20 ml/min (calculated using Cockcroft-Gault, actual body weight and serum creatinine): Estimated Creatinine Clearance: 42.1 mL/min (A) (by C-G formula based on SCr of 1.4 mg/dL (H)).     []   [x]  Patient has received drugs known to prolong the QT intervals within the last 48 hours (phenothiazines, tricyclics or tetracyclic antidepressants, erythromycin, H-1 antihistamines, cisapride, fluoroquinolones, azithromycin, ondansetron).   Updated information on QT prolonging agents is available to be searched on the following database:QT prolonging agents     []   [x]   Patient received a dose of hydrochlorothiazide (Oretic) alone or in any combination including triamterene (Dyazide, Maxzide) in the last 48 hours.    []   [x]  Patient received a medication known to increase dofetilide plasma concentrations prior to initial dofetilide dose:  Trimethoprim (Primsol, Proloprim) in the last 36  hours Verapamil (Calan, Verelan) in the last 36 hours or a sustained release dose in the last 72 hours Megestrol (Megace) in the last 5 days  Cimetidine (Tagamet) in the last 6 hours Ketoconazole (Nizoral) in the last 24 hours Itraconazole (Sporanox) in the last 48 hours  Prochlorperazine (Compazine) in the last 36 hours     []   [x]   Patient is known to have a history of torsades de pointes; congenital or acquired long QT syndromes.    []   []   Patient has received a Class 1 antiarrhythmic with less than 2 half-lives since last dose. (Disopyramide, Quinidine, Procainamide, Lidocaine, Mexiletine, Flecainide, Propafenone)    []   []   Patient has received amiodarone therapy in the past 3 months or amiodarone level is greater than 0.3 ng/ml.    Labs:    Component Value Date/Time   K 4.4 03/04/2023 1126   MG 2.1 03/04/2023 1126     Plan: Select One Calculated CrCl  Dose q12h  []  > 60 ml/min 500 mcg  [x]  40-60 ml/min 250 mcg  []  20-40 ml/min 125 mcg   [x]   Physician selected initial dose within range recommended for patients level of renal function - will monitor for response.  []   Physician selected initial dose outside of range recommended for patients level of renal function - will discuss if the dose should be altered at this time.   Patient has been appropriately anticoagulated with Eliquis.  Potassium: K >/= 4: Appropriate to initiate Tikosyn, no replacement needed    Magnesium: Mg >2: Appropriate to initiate Tikosyn, no replacement needed     Thank you for allowing pharmacy  to participate in this patient's care   Reece Leader, Colon Flattery, [facility] Clinical Pharmacist  03/04/2023 3:09 PM   [facility] pharmacy phone numbers are listed on [email or web address]

## 2023-03-04 NOTE — TOC CM/SW Note (Signed)
Transition of Care [facility]) - Inpatient Brief Assessment   Patient Details  Name: [name] [name]. MRN: [id number] Date of Birth: 09/09/1940  Transition of Care [facility]) CM/SW Contact:    Gala Lewandowsky, RN Phone Number: 03/04/2023, 2:42 PM   Clinical Narrative: Transition of Care Department [facility]) has reviewed the patient. Patient presented for Tikosyn Load. Benefits check submitted for cost. Case Manager will discuss cost and pharmacy of choice as the patient progresses.   Transition of Care Asessment: Insurance and Status: Insurance coverage has been reviewed Patient has primary care physician: Yes Prior/Current Home Services: No current home services Social Determinants of Health Reivew: SDOH reviewed no interventions necessary Readmission risk has been reviewed: Yes Transition of care needs: transition of care needs identified, TOC will continue to follow

## 2023-03-04 NOTE — TOC Benefit Eligibility Note (Signed)
Pharmacy Patient Advocate Encounter  Insurance verification completed.    The patient is insured through [facility] Medicare Part D  Ran test claim for dofetilide (Tikosyn) 250 mcg capsules and the current 30 day co-pay is $11.72.   This test claim was processed through [facility]- copay amounts may vary at other pharmacies due to pharmacy/plan contracts, or as the patient moves through the different stages of their insurance plan.    Roland Earl, CPHT Pharmacy Patient Advocate Specialist [facility] Patient Advocate Team Direct Number: [phone]  Fax: [phone]

## 2023-03-04 NOTE — Progress Notes (Signed)
Patient prefers not to use an alternative for his Breztri. I called pharmacy, they will order it for him to use his home supply.  Francis Dowse, PA-C

## 2023-03-04 NOTE — Progress Notes (Signed)
Primary Care Physician: [name], MD Primary Cardiologist: Lorine Bears, MD Electrophysiologist: Lanier Prude, MD     Referring Physician: Dr. Jerene Canny. is a 82 y.o. male with a history of HFpEF, CAD s/p PCI, COPD on oxygen, and atrial flutter who presents for consultation in the [facility]. He was seen by Dr. Lalla Brothers in March and recommended Tikosyn load for treatment of arrhythmia. Patient is here today for Tikosyn admission. Patient is on Eliquis for a CHADS2VASC score of 4.  On evaluation today, patient is currently in atrial flutter. Patient is here today for Tikosyn admission. Patient has not started any new medications since pharmacist review. Patient has not taken any benadryl in the past several days. Patient has not missed any doses of anticoagulant.   Today, he denies symptoms of palpitations, chest pain, shortness of breath, orthopnea, PND, lower extremity edema, dizziness, presyncope, syncope, snoring, daytime somnolence, bleeding, or neurologic sequela. The patient is tolerating medications without difficulties and is otherwise without complaint today.   he has a BMI of Body mass index is 22.44 kg/m.Marland Kitchen Filed Weights   03/04/23 1111  Weight: 71.9 kg    Current Outpatient Medications  Medication Sig Dispense Refill   acetaminophen (TYLENOL) 325 MG tablet Take 650 mg by mouth every 4 (four) hours as needed.     albuterol (PROVENTIL) (2.5 MG/3ML) 0.083% nebulizer solution Take 2.5 mg by nebulization 2 (two) times daily.     albuterol (VENTOLIN HFA) 108 (90 Base) MCG/ACT inhaler Inhale 2 puffs into the lungs 3 (three) times daily as needed for wheezing or shortness of breath.     apixaban (ELIQUIS) 5 MG TABS tablet Take 1 tablet (5 mg total) by mouth 2 (two) times daily. 180 tablet 3   Budeson-Glycopyrrol-Formoterol (BREZTRI AEROSPHERE) 160-9-4.8 MCG/ACT AERO Inhale 2 puffs into the lungs 2 (two) times daily.  10.7 g 6   clonazePAM (KLONOPIN) 0.5 MG tablet TAKE 1 TABLET BY MOUTH EVERY DAY AS NEEDED FOR ANXIETY 30 tablet 0   digoxin (LANOXIN) 0.125 MG tablet Take 1 tablet (0.125 mg total) by mouth daily. 90 tablet 3   diltiazem (CARDIZEM CD) 120 MG 24 hr capsule Take 1 capsule (120 mg total) by mouth 2 (two) times daily. 180 capsule 3   Docusate Calcium (STOOL SOFTENER PO) Take by mouth daily at 6 (six) AM.     furosemide (LASIX) 20 MG tablet Take one tablet (20 mg) by mouth daily with an addition tablet to equal (40 mg) every other day. 90 tablet 3   gabapentin (NEURONTIN) 100 MG capsule Take 100 mg by mouth 2 (two) times daily.     ipratropium-albuterol (DUONEB) 0.5-2.5 (3) MG/3ML SOLN INHALE 3 ML BY NEBULIZER EVERY 6 HOURS AS NEEDED 360 mL 1   nitroGLYCERIN (NITROSTAT) 0.4 MG SL tablet Place 1 tablet (0.4 mg total) under the tongue every 5 (five) minutes as needed for chest pain. 25 tablet 4   OXYGEN Inhale into the lungs. 2 liters upon exertion and 3 liters continuous at night.     pantoprazole (PROTONIX) 40 MG tablet Take 40 mg by mouth daily.     polyethylene glycol (MIRALAX / GLYCOLAX) 17 g packet Take 17 g by mouth as needed.     predniSONE (DELTASONE) 10 MG tablet Take 1 tablet (10 mg total) by mouth daily with breakfast. 90 tablet 3   rosuvastatin (CRESTOR) 20 MG tablet TAKE 1 TABLET BY MOUTH EVERYDAY  AT BEDTIME 90 tablet 0   silver sulfADIAZINE (SILVADENE) 1 % cream Apply 1 Application topically daily. 50 g 0   ammonium lactate (AMLACTIN) 12 % cream Apply topically at bedtime. (Patient not taking: Reported on 03/04/2023)     ipratropium (ATROVENT) 0.03 % nasal spray Place 2 sprays into both nostrils every 12 (twelve) hours. (Patient not taking: Reported on 03/04/2023) 30 mL 12   ketoconazole (NIZORAL) 2 % cream Apply topically 2 (two) times daily. (Patient not taking: Reported on 03/04/2023)     Melatonin 5 MG CAPS Take 5 mg by mouth at bedtime. (Patient not taking: Reported on 03/04/2023)     No  current facility-administered medications for this encounter.    Atrial Fibrillation Management history:  Previous antiarrhythmic drugs: None Previous cardioversions: 09/17/22 Previous ablations: None Anticoagulation history: Eliquis  ROS- All systems are reviewed and negative except as per the HPI above.  Physical Exam: BP 112/62   Pulse 75   Ht 5' 10.5" (1.791 m)   Wt 71.9 kg   BMI 22.44 kg/m   GEN: Well nourished, well developed in no acute distress NECK: No JVD; No carotid bruits CARDIAC: Regular rate and rhythm, no murmurs, rubs, gallops RESPIRATORY:  Clear to auscultation without rales, wheezing or rhonchi  ABDOMEN: Soft, non-tender, non-distended EXTREMITIES:  No edema; No deformity   EKG today demonstrates  Vent. rate 75 BPM PR interval 282 ms QRS duration 82 ms QT/QTcB 390/435 ms P-R-T axes * 106 55 Sinus rhythm with 1st degree A-V block Rightward axis Borderline ECG When compared with ECG of 17-Sep-2022 07:36, PREVIOUS ECG IS PRESENT PAC no longer present Confirmed by Charlton Haws [phone]) on 03/04/2023 12:34:44 PM  Echo 08/06/22 demonstrated   1. Tachycardia noted. Left ventricular ejection fraction, by estimation,  is 65 to 70%. The left ventricle has normal function. The left ventricle  has no regional wall motion abnormalities. There is mild left ventricular  hypertrophy. Left ventricular  diastolic parameters are indeterminate. There is the interventricular  septum is flattened in systole and diastole, consistent with right  ventricular pressure and volume overload.   2. Right ventricular systolic function is normal. The right ventricular  size is normal. Tricuspid regurgitation signal is inadequate for assessing  PA pressure.   3. The mitral valve is normal in structure. No evidence of mitral valve  regurgitation. No evidence of mitral stenosis.   4. The aortic valve is normal in structure. Aortic valve regurgitation is  not visualized. No aortic  stenosis is present.   5. The inferior vena cava is normal in size with greater than 50%  respiratory variability, suggesting right atrial pressure of 3 mmHg.    ASSESSMENT & PLAN CHA2DS2-VASc Score = 4  The patient's score is based upon: CHF History: 0 HTN History: 1 Diabetes History: 0 Stroke History: 0 Vascular Disease History: 1 Age Score: 2 Gender Score: 0       ASSESSMENT AND PLAN: Persistent atypical atrial flutter The patient's CHA2DS2-VASc score is 4, indicating a 4.8% annual risk of stroke.   Patient presents for dofetilide admission. Continue Eliquis, states no missed doses in the last 3 weeks. No recent benadryl use PharmD has screened medications QTc in SR 451 ms Labs today show creatinine at 1.4, K+ 4.4 and mag 2.1, CrCl calculated at 42 mL/min qualifies for 250 mcg BID dosage.    Secondary Hypercoagulable State (ICD10:  D68.69) The patient is at significant risk for stroke/thromboembolism based upon his CHA2DS2-VASc Score of 4.  Continue  Apixaban (Eliquis).  No missed doses.    Patient will present to admissions once bed is available.    Lake Bells, PA-C  [facility] [address] [place], [place] [address] [phone]

## 2023-03-05 ENCOUNTER — Encounter (HOSPITAL_COMMUNITY): Payer: Self-pay | Admitting: Anesthesiology

## 2023-03-05 ENCOUNTER — Other Ambulatory Visit: Payer: Self-pay | Admitting: Medical

## 2023-03-05 DIAGNOSIS — I4892 Unspecified atrial flutter: Secondary | ICD-10-CM

## 2023-03-05 LAB — DIGOXIN LEVEL: Digoxin Level: 0.6 ng/mL — ABNORMAL LOW (ref 0.8–2.0)

## 2023-03-05 NOTE — Progress Notes (Signed)
Post dose EKG is reviewed Converted to SR, QTc stable Continue Tikosyn  Francis Dowse, [place]

## 2023-03-05 NOTE — Progress Notes (Signed)
Rounding Note    Patient Name: [name] [name]. Date of Encounter: 03/05/2023  [facility] Cardiologist: Lorine Bears, MD   Subjective   Feels well, no new concerns  Inpatient Medications    Scheduled Meds:  apixaban  5 mg Oral BID   Budeson-Glycopyrrol-Formoterol  2 puff Inhalation 2 times per day   clonazePAM  0.5 mg Oral QHS   digoxin  0.125 mg Oral Daily   diltiazem  120 mg Oral BID   docusate sodium  100 mg Oral Q0600   dofetilide  250 mcg Oral BID   furosemide  20 mg Oral Daily   gabapentin  200 mg Oral QHS   melatonin  3 mg Oral QHS   pantoprazole  40 mg Oral Daily   predniSONE  10 mg Oral Q breakfast   rosuvastatin  20 mg Oral QHS   sodium chloride flush  3 mL Intravenous Q12H   Continuous Infusions:  sodium chloride     PRN Meds: sodium chloride, acetaminophen, albuterol, ipratropium-albuterol, nitroGLYCERIN, sodium chloride flush   Vital Signs    Vitals:   03/04/23 2331 03/05/23 0406 03/05/23 0700 03/05/23 0824  BP: 126/61 127/69  126/69  Pulse: 68 67    Resp: 18 17  16   Temp: 97.6 F (36.4 C) (!) 97.5 F (36.4 C)    TempSrc: Oral Oral    SpO2: 98% 98% 97%     Intake/Output Summary (Last 24 hours) at 03/05/2023 0859 Last data filed at 03/05/2023 0407 Gross per 24 hour  Intake --  Output 400 ml  Net -400 ml      03/04/2023   11:11 AM 01/14/2023   11:03 AM 01/07/2023   10:38 AM  Last 3 Weights  Weight (lbs) 158 lb 9.6 oz 158 lb 156 lb  Weight (kg) 71.94 kg 71.668 kg 70.761 kg      Telemetry    AFlutter 60's - Personally Reviewed  ECG    AFlutter 72bpm, QTc 405  - Personally Reviewed with Dr. Nelly Laurence  Physical Exam   GEN: No acute distress.   Neck: No JVD Cardiac: RRR, no murmurs, rubs, or gallops.  Respiratory: CTA b/l. GI: Soft, nontender, non-distended  MS: No edema; No deformity. Neuro:  Nonfocal  Psych: Normal affect   Labs    High Sensitivity Troponin:  No results for input(s): "TROPONINIHS" in the last 720  hours.   Chemistry Recent Labs  Lab 03/04/23 1126 03/05/23 0723  NA 140 138  K 4.4 4.2  CL 101 103  CO2 29 28  GLUCOSE 93 93  BUN 29* 29*  CREATININE 1.40* 1.17  CALCIUM 8.8* 8.5*  MG 2.1 2.2  GFRNONAA 50* >60  ANIONGAP 10 7    Lipids No results for input(s): "CHOL", "TRIG", "HDL", "LABVLDL", "LDLCALC", "CHOLHDL" in the last 168 hours.  HematologyNo results for input(s): "WBC", "RBC", "HGB", "HCT", "MCV", "MCH", "MCHC", "RDW", "PLT" in the last 168 hours. Thyroid No results for input(s): "TSH", "FREET4" in the last 168 hours.  BNPNo results for input(s): "BNP", "PROBNP" in the last 168 hours.  DDimer No results for input(s): "DDIMER" in the last 168 hours.   Radiology    No results found.  Cardiac Studies   TTE, 08/06/2022  1. Tachycardia noted. Left ventricular ejection fraction, by estimation, is 65 to 70%. The left ventricle has normal function. The left ventricle has no regional wall motion abnormalities. There is mild left ventricular hypertrophy. Left ventricular diastolic parameters are indeterminate. There is  the interventricular septum is flattened in systole and diastole, consistent with right ventricular pressure and volume overload.   2. Right ventricular systolic function is normal. The right ventricular size is normal. Tricuspid regurgitation signal is inadequate for assessing PA pressure.   3. The mitral valve is normal in structure. No evidence of mitral valve regurgitation. No evidence of mitral stenosis.   4. The aortic valve is normal in structure. Aortic valve regurgitation is not visualized. No aortic stenosis is present.   5. The inferior vena cava is normal in size with greater than 50% respiratory variability, suggesting right atrial pressure of 3 mmHg.     Patient Profile     82 y.o. male O2 dep. COPD, HTN, HLD, CAD (PCI 2019), GERD, R subclavian stenosis, AFlutter (atypical) admitted for Tikosyn  Assessment & Plan    AFlutter, atypical CHA2DS2Vasc  is 4, on Eliquis Tikosyn load is in progress K+ 4.2 Mag 2.2 Creat 1.17 (better) QTc stable  Remains in AFlutter this morning Plan DCCV tomorrow if not in SR, pt is aware and agreeable NPO after MN  Dig level pending (last was 0.6)   HTN Home meds  COPD Home O2, meds, nebs Pt prefers home Brestri  CAD No symptoms Home meds   For questions or updates, please contact [facility] Please consult [email or web address] for contact info under        Signed, Sheilah Pigeon, PA-C  03/05/2023, 8:59 AM

## 2023-03-05 NOTE — Plan of Care (Signed)

## 2023-03-05 NOTE — Progress Notes (Signed)
Pharmacy: Dofetilide (Tikosyn) - Follow Up Assessment and Electrolyte Replacement  Pharmacy consulted to assist in monitoring and replacing electrolytes in this 82 y.o. male admitted on 03/04/2023 undergoing dofetilide initiation.   Labs:    Component Value Date/Time   K 4.4 03/04/2023 1126   MG 2.1 03/04/2023 1126     Plan: Potassium: K >/= 4: No additional supplementation needed  Magnesium: Mg > 2: No additional supplementation needed    Thank you for allowing pharmacy to participate in this patient's care   Harland German, PharmD Clinical Pharmacist **Pharmacist phone directory can now be found on [email or web address] ([email or web address]).  Listed under [facility].

## 2023-03-05 NOTE — Anesthesia Preprocedure Evaluation (Signed)
Anesthesia Evaluation    Reviewed: Allergy & Precautions, Patient's Chart, lab work & pertinent test results  History of Anesthesia Complications Negative for: history of anesthetic complications  Airway        Dental   Pulmonary shortness of breath, COPD, former smoker          Cardiovascular hypertension, Pt. on medications + CAD, + Cardiac Stents and + Peripheral Vascular Disease  + dysrhythmias Atrial Fibrillation    '24 TTE - EF 65-70%, no rwma, mild LVH, nl RV fxn.    Neuro/Psych  PSYCHIATRIC DISORDERS Anxiety     negative neurological ROS     GI/Hepatic ,GERD  ,,(+)     substance abuse  alcohol use  Endo/Other  negative endocrine ROS    Renal/GU Renal disease     Musculoskeletal  (+) Arthritis ,    Abdominal   Peds  Hematology  On eliquis    Anesthesia Other Findings   Reproductive/Obstetrics                             Anesthesia Physical Anesthesia Plan  ASA: 3  Anesthesia Plan: General   Post-op Pain Management: Minimal or no pain anticipated   Induction: Intravenous  PONV Risk Score and Plan: 2 and Treatment may vary due to age or medical condition and Propofol infusion  Airway Management Planned: Natural Airway and Mask  Additional Equipment: None  Intra-op Plan:   Post-operative Plan:   Informed Consent:   Plan Discussed with: CRNA and Anesthesiologist  Anesthesia Plan Comments:        Anesthesia Quick Evaluation

## 2023-03-05 NOTE — Care Management (Signed)
03-05-23 1620 Patient presented for Tikosyn Load. Case Manager spoke with the patient regarding co pay cost. Patient is agreeable to cost and would like to have the initial Rx filled via [facility] and the Rx refills 90 day supply escribed to [facility], [address] [place]. No further needs identified at this time.

## 2023-03-05 NOTE — Plan of Care (Signed)

## 2023-03-06 ENCOUNTER — Encounter (HOSPITAL_COMMUNITY)
Admission: AD | Disposition: A | Discharge status: Home / Self Care | Payer: Self-pay | Source: Ambulatory Visit | Attending: Cardiology

## 2023-03-06 SURGERY — CARDIOVERSION
Anesthesia: General

## 2023-03-06 MED ORDER — POTASSIUM CHLORIDE CRYS ER 20 MEQ PO TBCR
20.0000 meq | EXTENDED_RELEASE_TABLET | Freq: Once | ORAL | Status: AC
  Administered 2023-03-06: 20 meq via ORAL
  Filled 2023-03-06: qty 1

## 2023-03-06 NOTE — H&P (Signed)
Late entry H&P       Primary Care Physician: [name], MD Primary Cardiologist: Lorine Bears, MD Electrophysiologist: Lanier Prude, MD      Referring Physician: Dr. Jerene Canny. is a 82 y.o. male with a history of HFpEF, CAD s/p PCI, COPD on oxygen, and atrial flutter who presents for consultation in the [facility]. He was seen by Dr. Lalla Brothers in March and recommended Tikosyn load for treatment of arrhythmia. Patient is here today for Tikosyn admission. Patient is on Eliquis for a CHADS2VASC score of 4.   On evaluation today, patient is currently in atrial flutter. Patient is here today for Tikosyn admission. Patient has not started any new medications since pharmacist review. Patient has not taken any benadryl in the past several days. Patient has not missed any doses of anticoagulant.    Today, he denies symptoms of palpitations, chest pain, shortness of breath, orthopnea, PND, lower extremity edema, dizziness, presyncope, syncope, snoring, daytime somnolence, bleeding, or neurologic sequela. The patient is tolerating medications without difficulties and is otherwise without complaint today.    he has a BMI of Body mass index is 22.44 kg/m.Marland Kitchen    Filed Weights    03/04/23 1111  Weight: 71.9 kg            Current Outpatient Medications  Medication Sig Dispense Refill   acetaminophen (TYLENOL) 325 MG tablet Take 650 mg by mouth every 4 (four) hours as needed.       albuterol (PROVENTIL) (2.5 MG/3ML) 0.083% nebulizer solution Take 2.5 mg by nebulization 2 (two) times daily.       albuterol (VENTOLIN HFA) 108 (90 Base) MCG/ACT inhaler Inhale 2 puffs into the lungs 3 (three) times daily as needed for wheezing or shortness of breath.       apixaban (ELIQUIS) 5 MG TABS tablet Take 1 tablet (5 mg total) by mouth 2 (two) times daily. 180 tablet 3   Budeson-Glycopyrrol-Formoterol (BREZTRI AEROSPHERE) 160-9-4.8 MCG/ACT AERO  Inhale 2 puffs into the lungs 2 (two) times daily. 10.7 g 6   clonazePAM (KLONOPIN) 0.5 MG tablet TAKE 1 TABLET BY MOUTH EVERY DAY AS NEEDED FOR ANXIETY 30 tablet 0   digoxin (LANOXIN) 0.125 MG tablet Take 1 tablet (0.125 mg total) by mouth daily. 90 tablet 3   diltiazem (CARDIZEM CD) 120 MG 24 hr capsule Take 1 capsule (120 mg total) by mouth 2 (two) times daily. 180 capsule 3   Docusate Calcium (STOOL SOFTENER PO) Take by mouth daily at 6 (six) AM.       furosemide (LASIX) 20 MG tablet Take one tablet (20 mg) by mouth daily with an addition tablet to equal (40 mg) every other day. 90 tablet 3   gabapentin (NEURONTIN) 100 MG capsule Take 100 mg by mouth 2 (two) times daily.       ipratropium-albuterol (DUONEB) 0.5-2.5 (3) MG/3ML SOLN INHALE 3 ML BY NEBULIZER EVERY 6 HOURS AS NEEDED 360 mL 1   nitroGLYCERIN (NITROSTAT) 0.4 MG SL tablet Place 1 tablet (0.4 mg total) under the tongue every 5 (five) minutes as needed for chest pain. 25 tablet 4   OXYGEN Inhale into the lungs. 2 liters upon exertion and 3 liters continuous at night.       pantoprazole (PROTONIX) 40 MG tablet Take 40 mg by mouth daily.       polyethylene glycol (MIRALAX / GLYCOLAX) 17 g packet Take 17 g by mouth  as needed.       predniSONE (DELTASONE) 10 MG tablet Take 1 tablet (10 mg total) by mouth daily with breakfast. 90 tablet 3   rosuvastatin (CRESTOR) 20 MG tablet TAKE 1 TABLET BY MOUTH EVERYDAY AT BEDTIME 90 tablet 0   silver sulfADIAZINE (SILVADENE) 1 % cream Apply 1 Application topically daily. 50 g 0   ammonium lactate (AMLACTIN) 12 % cream Apply topically at bedtime. (Patient not taking: Reported on 03/04/2023)       ipratropium (ATROVENT) 0.03 % nasal spray Place 2 sprays into both nostrils every 12 (twelve) hours. (Patient not taking: Reported on 03/04/2023) 30 mL 12   ketoconazole (NIZORAL) 2 % cream Apply topically 2 (two) times daily. (Patient not taking: Reported on 03/04/2023)       Melatonin 5 MG CAPS Take 5 mg by mouth at  bedtime. (Patient not taking: Reported on 03/04/2023)          No current facility-administered medications for this encounter.        Atrial Fibrillation Management history:   Previous antiarrhythmic drugs: None Previous cardioversions: 09/17/22 Previous ablations: None Anticoagulation history: Eliquis   ROS- All systems are reviewed and negative except as per the HPI above.   Physical Exam: BP 112/62   Pulse 75   Ht 5' 10.5" (1.791 m)   Wt 71.9 kg   BMI 22.44 kg/m    GEN: Well nourished, well developed in no acute distress NECK: No JVD; No carotid bruits CARDIAC: Regular rate and rhythm, no murmurs, rubs, gallops RESPIRATORY:  Clear to auscultation without rales, wheezing or rhonchi  ABDOMEN: Soft, non-tender, non-distended EXTREMITIES:  No edema; No deformity    EKG today demonstrates  Vent. rate 75 BPM PR interval 282 ms QRS duration 82 ms QT/QTcB 390/435 ms P-R-T axes * 106 55 Sinus rhythm with 1st degree A-V block Rightward axis Borderline ECG When compared with ECG of 17-Sep-2022 07:36, PREVIOUS ECG IS PRESENT PAC no longer present Confirmed by Charlton Haws [phone]) on 03/04/2023 12:34:44 PM   Echo 08/06/22 demonstrated   1. Tachycardia noted. Left ventricular ejection fraction, by estimation,  is 65 to 70%. The left ventricle has normal function. The left ventricle  has no regional wall motion abnormalities. There is mild left ventricular  hypertrophy. Left ventricular  diastolic parameters are indeterminate. There is the interventricular  septum is flattened in systole and diastole, consistent with right  ventricular pressure and volume overload.   2. Right ventricular systolic function is normal. The right ventricular  size is normal. Tricuspid regurgitation signal is inadequate for assessing  PA pressure.   3. The mitral valve is normal in structure. No evidence of mitral valve  regurgitation. No evidence of mitral stenosis.   4. The aortic valve is  normal in structure. Aortic valve regurgitation is  not visualized. No aortic stenosis is present.   5. The inferior vena cava is normal in size with greater than 50%  respiratory variability, suggesting right atrial pressure of 3 mmHg.      ASSESSMENT & PLAN CHA2DS2-VASc Score = 4  The patient's score is based upon: CHF History: 0 HTN History: 1 Diabetes History: 0 Stroke History: 0 Vascular Disease History: 1 Age Score: 2 Gender Score: 0         ASSESSMENT AND PLAN: Persistent atypical atrial flutter The patient's CHA2DS2-VASc score is 4, indicating a 4.8% annual risk of stroke.   Patient presents for dofetilide admission. Continue Eliquis, states no missed doses in the last  3 weeks. No recent benadryl use PharmD has screened medications QTc in SR 451 ms Labs today show creatinine at 1.4, K+ 4.4 and mag 2.1, CrCl calculated at 42 mL/min qualifies for 250 mcg BID dosage.      Secondary Hypercoagulable State (ICD10:  D68.69) The patient is at significant risk for stroke/thromboembolism based upon his CHA2DS2-VASc Score of 4.  Continue Apixaban (Eliquis).  No missed doses.   AF[facility] clinic note as above to severe as H&P Francis Dowse, PA-C

## 2023-03-06 NOTE — Progress Notes (Signed)
Post dose EKG is reviewed QTc remains acceptable Continue Tikosyn  Francis Dowse, PA-C

## 2023-03-06 NOTE — Progress Notes (Addendum)
Rounding Note    Patient Name: [name] [name]. Date of Encounter: 03/06/2023  [facility] Cardiologist: Lorine Bears, MD   Subjective   Feels well, worries about exercising/exerting himself and triggering recurrent AF.  Dr. Nelly Laurence discussed this with him, and encouraged exercise to his capacity  Inpatient Medications    Scheduled Meds:  apixaban  5 mg Oral BID   Budeson-Glycopyrrol-Formoterol  2 puff Inhalation 2 times per day   clonazePAM  0.5 mg Oral QHS   diltiazem  120 mg Oral BID   docusate sodium  100 mg Oral Q0600   dofetilide  250 mcg Oral BID   furosemide  20 mg Oral Daily   gabapentin  200 mg Oral QHS   melatonin  3 mg Oral QHS   pantoprazole  40 mg Oral Daily   predniSONE  10 mg Oral Q breakfast   rosuvastatin  20 mg Oral QHS   sodium chloride flush  3 mL Intravenous Q12H   Continuous Infusions:  sodium chloride     PRN Meds: sodium chloride, acetaminophen, albuterol, ipratropium-albuterol, nitroGLYCERIN, sodium chloride flush   Vital Signs    Vitals:   03/05/23 1614 03/05/23 2010 03/06/23 0430 03/06/23 0824  BP: 139/63 136/65 (!) 115/52 118/81  Pulse: 77 80 66 71  Resp: 16 18 15 16   Temp: 97.9 F (36.6 C) 97.6 F (36.4 C) 97.6 F (36.4 C) 97.6 F (36.4 C)  TempSrc: Oral Oral Oral Oral  SpO2: 100% 100% 93% 100%    Intake/Output Summary (Last 24 hours) at 03/06/2023 0849 Last data filed at 03/05/2023 2005 Gross per 24 hour  Intake 240 ml  Output --  Net 240 ml      03/04/2023   11:11 AM 01/14/2023   11:03 AM 01/07/2023   10:38 AM  Last 3 Weights  Weight (lbs) 158 lb 9.6 oz 158 lb 156 lb  Weight (kg) 71.94 kg 71.668 kg 70.761 kg      Telemetry    SR 60's - Personally Reviewed  ECG    SR 74, QTc  - Personally Reviewed with Dr. Nelly Laurence  Physical Exam   Exam remains unchanged GEN: No acute distress.   Neck: No JVD Cardiac: RRR, no murmurs, rubs, or gallops.  Respiratory: CTA b/l. GI: Soft, nontender,  non-distended  MS: large areas of ecchymosis b/l UE No edema; No deformity. Neuro:  Nonfocal  Psych: Normal affect   Labs    High Sensitivity Troponin:  No results for input(s): "TROPONINIHS" in the last 720 hours.   Chemistry Recent Labs  Lab 03/04/23 1126 03/05/23 0723 03/06/23 0208  NA 140 138 136  K 4.4 4.2 3.9  CL 101 103 101  CO2 29 28 28   GLUCOSE 93 93 93  BUN 29* 29* 25*  CREATININE 1.40* 1.17 1.12  CALCIUM 8.8* 8.5* 8.3*  MG 2.1 2.2 2.2  GFRNONAA 50* >60 >60  ANIONGAP 10 7 7     Lipids No results for input(s): "CHOL", "TRIG", "HDL", "LABVLDL", "LDLCALC", "CHOLHDL" in the last 168 hours.  HematologyNo results for input(s): "WBC", "RBC", "HGB", "HCT", "MCV", "MCH", "MCHC", "RDW", "PLT" in the last 168 hours. Thyroid No results for input(s): "TSH", "FREET4" in the last 168 hours.  BNPNo results for input(s): "BNP", "PROBNP" in the last 168 hours.  DDimer No results for input(s): "DDIMER" in the last 168 hours.   Radiology    No results found.  Cardiac Studies   TTE, 08/06/2022  1. Tachycardia noted. Left  ventricular ejection fraction, by estimation, is 65 to 70%. The left ventricle has normal function. The left ventricle has no regional wall motion abnormalities. There is mild left ventricular hypertrophy. Left ventricular diastolic parameters are indeterminate. There is the interventricular septum is flattened in systole and diastole, consistent with right ventricular pressure and volume overload.   2. Right ventricular systolic function is normal. The right ventricular size is normal. Tricuspid regurgitation signal is inadequate for assessing PA pressure.   3. The mitral valve is normal in structure. No evidence of mitral valve regurgitation. No evidence of mitral stenosis.   4. The aortic valve is normal in structure. Aortic valve regurgitation is not visualized. No aortic stenosis is present.   5. The inferior vena cava is normal in size with greater than 50%  respiratory variability, suggesting right atrial pressure of 3 mmHg.     Patient Profile     82 y.o. male O2 dep. COPD, HTN, HLD, CAD (PCI 2019), GERD, R subclavian stenosis, AFlutter (atypical) admitted for Tikosyn  Assessment & Plan    AFlutter, atypical CHA2DS2Vasc is 4, on Eliquis Tikosyn load is in progress K+ 3.9 (replacement with pharmacist as per protocol) Mag 2.1 Creat 1.12 (baseline) QTc remains stable  Dig level 0.6 this admission  Converted with drug yesterday Remains in SR Anticipate discharge tomorrow   HTN Home meds  COPD Home O2, meds, nebs Pt prefers home Brestri  CAD No symptoms Home meds   He will need clearance to resume out patient therapy, exercise regime upon discharge   For questions or updates, please contact [facility] Please consult [email or web address] for contact info under        Signed, Sheilah Pigeon, PA-C  03/06/2023, 8:49 AM

## 2023-03-06 NOTE — Plan of Care (Signed)

## 2023-03-06 NOTE — Progress Notes (Signed)
Pharmacy: Dofetilide (Tikosyn) - Follow Up Assessment and Electrolyte Replacement  Pharmacy consulted to assist in monitoring and replacing electrolytes in this 82 y.o. male admitted on 03/04/2023 undergoing dofetilide initiation.   Labs:    Component Value Date/Time   K 3.9 03/06/2023 0208   MG 2.2 03/06/2023 0208     Plan: Potassium: -K dur xq  Magnesium: Mg > 2: No additional supplementation needed  He has only needed potassium since 8/5, I anticipate no need for potassium supplementation at discharge   Thank you for allowing pharmacy to participate in this patient's care   .amdre

## 2023-03-07 ENCOUNTER — Encounter: Payer: Self-pay | Admitting: Internal Medicine

## 2023-03-07 ENCOUNTER — Other Ambulatory Visit (HOSPITAL_COMMUNITY): Payer: Self-pay

## 2023-03-07 MED ORDER — DOFETILIDE 250 MCG PO CAPS
250.0000 ug | ORAL_CAPSULE | Freq: Two times a day (BID) | ORAL | 5 refills | Status: DC
  Filled 2023-03-07: qty 60, 30d supply, fill #0

## 2023-03-07 NOTE — Progress Notes (Signed)
Pharmacy: Dofetilide (Tikosyn) - Follow Up Assessment and Electrolyte Replacement  Pharmacy consulted to assist in monitoring and replacing electrolytes in this 82 y.o. male admitted on 03/04/2023 undergoing dofetilide initiation.   Labs:    Component Value Date/Time   K 4.2 03/07/2023 0138   MG 2.2 03/07/2023 0138     Plan: Potassium: K >/= 4: No additional supplementation needed  Magnesium: Mg > 2: No additional supplementation needed   He has only received potassium since 8/5. I recommend no potassium supplementation at discharge   Thank you for allowing pharmacy to participate in this patient's care   Harland German, PharmD Clinical Pharmacist **Pharmacist phone directory can now be found on [email or web address] ([email or web address]).  Listed under [facility].

## 2023-03-07 NOTE — Discharge Instructions (Signed)
OK to resume your exercise, rehab program with no cardiac restrictions

## 2023-03-07 NOTE — Progress Notes (Addendum)
Post dose EKG is reviewed with Dr. Nelly Laurence, stable QTc Telemetry SR  Continue Tikosyn Anticipate discharge later this morning/today  Francis Dowse, PA-C

## 2023-03-07 NOTE — Progress Notes (Signed)
Discharge instructions (including medications) discussed with and copy provided to patient/caregiver 

## 2023-03-07 NOTE — Plan of Care (Signed)

## 2023-03-07 NOTE — Plan of Care (Signed)

## 2023-03-07 NOTE — Discharge Summary (Signed)
ELECTROPHYSIOLOGY PROCEDURE DISCHARGE SUMMARY    Patient ID: [name] Therriault.,  MRN: [id number], DOB/AGE: 04-17-1941 82 y.o.  Admit date: 03/04/2023 Discharge date: 03/07/2023  Primary Care Physician: [name], MD Primary Cardiologist: Dr. Kirke Corin Electrophysiologist: Dr. Lalla Brothers  Primary Discharge Diagnosis:  1.  Atrial flutter status post Tikosyn loading this admission      CHA2DS2Vasc is 5, on Eliquis  Secondary Discharge Diagnosis:  HTN COPD Home O2 CAD HLD GERD PVD  Allergies  Allergen Reactions   Flagyl [Metronidazole] Other (See Comments)    Other reaction(s): skin reaction     Procedures This Admission:  1.  Tikosyn loading  Brief HPI: [name] Janusz. is a 82 y.o. male with a past medical history as noted above.  They were referred to EP in the outpatient setting for treatment options of atrial fibrillation.  Risks, benefits, and alternatives to Tikosyn were reviewed with the patient who wished to proceed.    Hospital Course:  The patient was admitted and Tikosyn was initiated.  Renal function and electrolytes were followed during the hospitalization.  The patient's QTc remained stable.  He converted with drug and did not require DCCV.  He was monitored until discharge on telemetry which demonstrated AFlutter >> SR w/occasional PACs.  On the day of discharge,  feels well, was examined by Dr Nelly Laurence who considered the patient stable for discharge to home.  Follow-up has been arranged with the AFib clinic in 1 week and with the EP team in 4 weeks.   Tikosyn teaching was completed No new or additional electrolyte replacement for home  Physical Exam: Vitals:   03/06/23 2002 03/06/23 2025 03/07/23 0428 03/07/23 0857  BP: (!) 134/55  127/63 (!) 145/65  Pulse: 78   72  Resp: 18  16 16   Temp: 97.6 F (36.4 C)  98 F (36.7 C) 98 F (36.7 C)  TempSrc: Oral  Oral Oral  SpO2:  98% 98% 99%  Weight:      Height:         GEN- The patient is  well appearing, alert and oriented x 3 today.   HEENT: normocephalic, atraumatic; sclera clear, conjunctiva pink; hearing intact; oropharynx clear; neck supple, no JVP Lymph- no cervical lymphadenopathy Lungs- CTA b/l, normal work of breathing.  Slight exp wheeze b/l, no  rales, rhonchi Heart- RRR, no murmurs, rubs or gallops, PMI not laterally displaced GI- soft, non-tender, non-distended Extremities- no clubbing, cyanosis, or edema MS- no significant deformity or atrophy Skin- warm and dry, no rash or lesion Psych- euthymic mood, full affect Neuro- strength and sensation are intact   Labs:   Lab Results  Component Value Date   WBC 11.5 (H) 09/22/2022   HGB 11.7 (L) 09/22/2022   HCT 37.5 (L) 09/22/2022   MCV 99.2 09/22/2022   PLT 368 09/22/2022    Recent Labs  Lab 03/07/23 0138  NA 138  K 4.2  CL 101  CO2 29  BUN 27*  CREATININE 1.00  CALCIUM 8.2*  GLUCOSE 110*     Discharge Medications:  Allergies as of 03/07/2023       Reactions   Flagyl [metronidazole] Other (See Comments)   Other reaction(s): skin reaction        Medication List     STOP taking these medications    digoxin 0.125 MG tablet Commonly known as: LANOXIN       TAKE these medications    acetaminophen 325 MG tablet Commonly  known as: TYLENOL Take 650 mg by mouth every 4 (four) hours as needed.   albuterol (2.5 MG/3ML) 0.083% nebulizer solution Commonly known as: PROVENTIL Take 2.5 mg by nebulization 2 (two) times daily.   albuterol 108 (90 Base) MCG/ACT inhaler Commonly known as: VENTOLIN HFA Inhale 2 puffs into the lungs 3 (three) times daily as needed for wheezing or shortness of breath.   apixaban 5 MG Tabs tablet Commonly known as: ELIQUIS Take 1 tablet (5 mg total) by mouth 2 (two) times daily.   Breztri Aerosphere 160-9-4.8 MCG/ACT Aero Generic drug: Budeson-Glycopyrrol-Formoterol Inhale 2 puffs into the lungs 2 (two) times daily.   clonazePAM 0.5 MG tablet Commonly  known as: KLONOPIN TAKE 1 TABLET BY MOUTH EVERY DAY AS NEEDED FOR ANXIETY What changed: See the new instructions.   diltiazem 120 MG 24 hr capsule Commonly known as: CARDIZEM CD Take 1 capsule (120 mg total) by mouth 2 (two) times daily.   dofetilide 250 MCG capsule Commonly known as: TIKOSYN Take 1 capsule (250 mcg total) by mouth 2 (two) times daily.   furosemide 40 MG tablet Commonly known as: LASIX Take 40 mg by mouth every other day. What changed: Another medication with the same name was changed. Make sure you understand how and when to take each.   furosemide 20 MG tablet Commonly known as: LASIX TAKE 1 TABLET BY MOUTH EVERY DAY WITH ADDITIONAL TABLET EVERY OTHER DAY What changed: additional instructions   gabapentin 100 MG capsule Commonly known as: NEURONTIN Take 200 mg by mouth at bedtime.   ipratropium 0.03 % nasal spray Commonly known as: ATROVENT Place 2 sprays into both nostrils every 12 (twelve) hours.   ipratropium-albuterol 0.5-2.5 (3) MG/3ML Soln Commonly known as: DUONEB INHALE 3 ML BY NEBULIZER EVERY 6 HOURS AS NEEDED   nitroGLYCERIN 0.4 MG SL tablet Commonly known as: NITROSTAT Place 1 tablet (0.4 mg total) under the tongue every 5 (five) minutes as needed for chest pain.   OXYGEN Inhale into the lungs. 2 liters upon exertion and 3 liters continuous at night.   pantoprazole 40 MG tablet Commonly known as: PROTONIX Take 40 mg by mouth daily.   polyethylene glycol 17 g packet Commonly known as: MIRALAX / GLYCOLAX Take 17 g by mouth daily as needed for moderate constipation.   predniSONE 10 MG tablet Commonly known as: DELTASONE Take 1 tablet (10 mg total) by mouth daily with breakfast.   rosuvastatin 20 MG tablet Commonly known as: CRESTOR TAKE 1 TABLET BY MOUTH EVERYDAY AT BEDTIME What changed: See the new instructions.   silver sulfADIAZINE 1 % cream Commonly known as: SILVADENE Apply 1 Application topically daily.   STOOL SOFTENER  PO Take 1 tablet by mouth every evening.        Disposition: home Discharge Instructions     Diet - low sodium heart healthy   Complete by: As directed    Increase activity slowly   Complete by: As directed         Duration of Discharge Encounter: Greater than 30 minutes including physician time.  Norma Fredrickson, PA-C 03/07/2023 11:51 AM

## 2023-03-07 NOTE — Care Management Important Message (Signed)
Important Message  Patient Details  Name: [name] [name]. MRN: [id number] Date of Birth: 1941-06-27   Medicare Important Message Given:  Yes     Renie Ora 03/07/2023, 8:26 AM

## 2023-03-08 ENCOUNTER — Telehealth: Payer: Self-pay | Admitting: *Deleted

## 2023-03-08 NOTE — Telephone Encounter (Signed)
Patient contacted the office and stated [facility] did not receive this fax from yesterday, asked if it could be re faxed to the number [phone]

## 2023-03-08 NOTE — Transitions of Care (Post Inpatient/ED Visit) (Signed)
03/08/2023  Name: [name] [name]. MRN: [id number] DOB: October 28, 1940  Today's TOC FU Call Status: Today's TOC FU Call Status:: Successful TOC FU Call Completed TOC FU Call Complete Date: 03/08/23  Transition Care Management Follow-up Telephone Call Date of Discharge: 03/07/23 Discharge Facility: [facility]) Type of Discharge: Inpatient Admission Primary Inpatient Discharge Diagnosis:: atrial Fib How have you been since you were released from the hospital?: Better Any questions or concerns?: Yes Patient Questions/Concerns:: If my heart goes out of rhythmn what do I do. If your are l symptomatice go to ED or call Cardiologist. Patient Questions/Concerns Addressed: Other:  Items Reviewed: Did you receive and understand the discharge instructions provided?: Yes Medications obtained,verified, and reconciled?: Yes (Medications Reviewed) Any new allergies since your discharge?: No Dietary orders reviewed?: No Do you have support at home?: Yes People in Home: spouse Name of Support/Comfort Primary Source: Darl Pikes  Medications Reviewed Today: Medications Reviewed Today     Reviewed by Luella Cook, RN (Case Manager) on 03/08/23 at 1141  Med List Status: <None>   Medication Order Taking? Sig Documenting Provider Last Dose Status Informant  acetaminophen (TYLENOL) 325 MG tablet [id number] Yes Take 650 mg by mouth every 4 (four) hours as needed. [provider] Taking Active Spouse/Significant Other           Med Note (SATTERFIELD, Genoveva Ill   Mon Mar 04, 2023  4:11 PM) Still has on hand just in case   albuterol (PROVENTIL) (2.5 MG/3ML) 0.083% nebulizer solution [id number] Yes Take 2.5 mg by nebulization 2 (two) times daily. [provider] Taking Active Spouse/Significant Other  albuterol (VENTOLIN HFA) 108 (90 Base) MCG/ACT inhaler [id number] Yes Inhale 2 puffs into the lungs 3 (three) times daily as needed for wheezing or shortness of breath. [provider] Taking Active Spouse/Significant Other  apixaban (ELIQUIS) 5 MG TABS tablet [id number] Yes Take 1 tablet (5 mg total) by mouth 2 (two) times daily. [name], MD Taking Active Spouse/Significant Other           Med Note (SATTERFIELD, Marquis Buggy Mar 04, 2023  4:04 PM)    Budeson-Glycopyrrol-Formoterol (BREZTRI AEROSPHERE) 160-9-4.8 MCG/ACT AERO [id number] Yes Inhale 2 puffs into the lungs 2 (two) times daily. Leslye Peer, MD Taking Active Spouse/Significant Other  clonazePAM (KLONOPIN) 0.5 MG tablet [id number] Yes TAKE 1 TABLET BY MOUTH EVERY DAY AS NEEDED FOR ANXIETY  Patient taking differently: Take 0.5 mg by mouth at bedtime.   [name], MD Taking Active Spouse/Significant Other  diltiazem (CARDIZEM CD) 120 MG 24 hr capsule [id number] Yes Take 1 capsule (120 mg total) by mouth 2 (two) times daily. Furth, Cadence H, PA-C Taking Active Spouse/Significant Other  Docusate Calcium (STOOL SOFTENER PO) [id number] Yes Take 1 tablet by mouth every evening. [provider] Taking Active Spouse/Significant Other  dofetilide (TIKOSYN) 250 MCG capsule [id number] Yes Take 1 capsule (250 mcg total) by mouth 2 (two) times daily. Sheilah Pigeon, PA-C Taking Active   furosemide (LASIX) 20 MG tablet [id number] Yes TAKE 1 TABLET BY MOUTH EVERY DAY WITH ADDITIONAL TABLET EVERY OTHER DAY Furth, Cadence H, PA-C Taking Active   furosemide (LASIX) 40 MG tablet [id number] Yes Take 40 mg by mouth every other day. [provider] Taking Active Spouse/Significant Other           Med Note (SATTERFIELD, Genoveva Ill   Mon Mar 04, 2023  4:12 PM) Alternate with the 20 mg tablet every other  day  per spouse   gabapentin (NEURONTIN) 100 MG capsule [id number] Yes Take 200 mg by mouth at bedtime. [provider] Taking Active Spouse/Significant Other  ipratropium (ATROVENT) 0.03 % nasal spray [id number]  Place 2 sprays into both nostrils every 12 (twelve) hours.  Patient not taking: Reported  on 03/04/2023   Leslye Peer, MD  Active Spouse/Significant Other  ipratropium-albuterol (DUONEB) 0.5-2.5 (3) MG/3ML SOLN [id number]  INHALE 3 ML BY NEBULIZER EVERY 6 HOURS AS NEEDED  Patient not taking: Reported on 03/04/2023   Leslye Peer, MD  Active Spouse/Significant Other  nitroGLYCERIN (NITROSTAT) 0.4 MG SL tablet [id number] Yes Place 1 tablet (0.4 mg total) under the tongue every 5 (five) minutes as needed for chest pain. Revankar, Aundra Dubin, MD Taking Active Spouse/Significant Other  OXYGEN [id number]  Inhale into the lungs. 2 liters upon exertion and 3 liters continuous at night. [provider]  Active Spouse/Significant Other  pantoprazole (PROTONIX) 40 MG tablet [id number]  Take 40 mg by mouth daily. [provider]  Active Spouse/Significant Other  polyethylene glycol (MIRALAX / GLYCOLAX) 17 g packet [id number] Yes Take 17 g by mouth daily as needed for moderate constipation. [provider] Taking Active Spouse/Significant Other           Med Note (SATTERFIELD, Genoveva Ill   Mon Mar 04, 2023  4:13 PM) Still has on hand   predniSONE (DELTASONE) 10 MG tablet [id number] Yes Take 1 tablet (10 mg total) by mouth daily with breakfast. [name], MD Taking Active Spouse/Significant Other  rosuvastatin (CRESTOR) 20 MG tablet [id number] Yes TAKE 1 TABLET BY MOUTH EVERYDAY AT BEDTIME  Patient taking differently: Take 20 mg by mouth every evening.   Creig Hines, NP Taking Active Spouse/Significant Other  silver sulfADIAZINE (SILVADENE) 1 % cream [id number] No Apply 1 Application topically daily.  Patient not taking: Reported on 03/08/2023   [name], MD Not Taking Active Spouse/Significant Other            Home Care and Equipment/Supplies: Were Home Health Services Ordered?: NA Any new equipment or medical supplies ordered?: NA  Functional Questionnaire: Do you need assistance with bathing/showering or dressing?: No Do you need assistance  with meal preparation?: No Do you need assistance with eating?: No Do you have difficulty maintaining continence: No Do you need assistance with getting out of bed/getting out of a chair/moving?: No Do you have difficulty managing or taking your medications?: No  Follow up appointments reviewed: PCP Follow-up appointment confirmed?: NA Specialist Hospital Follow-up appointment confirmed?: Yes Date of Specialist follow-up appointment?: 03/14/23 Follow-Up Specialty Provider:: AFib [place] 3:30, [id number] Dr Allena Katz, [id number] [facility] cardiology Do you need transportation to your follow-up appointment?: No Do you understand care options if your condition(s) worsen?: Yes-patient verbalized understanding  SDOH Interventions Today    Flowsheet Row Most Recent Value  SDOH Interventions   Food Insecurity Interventions Intervention Not Indicated  Housing Interventions Intervention Not Indicated  Transportation Interventions Patient Resources (Friends/Family), Intervention Not Indicated      Interventions Today    Flowsheet Row Most Recent Value  Chronic Disease   Chronic disease during today's visit Atrial Fibrillation (AFib)  General Interventions   General Interventions Discussed/Reviewed General Interventions Discussed, General Interventions Reviewed, Doctor Visits, Referral to Nurse  [referred to Care Coordination nurse George Ina  for Afib]  Doctor Visits Discussed/Reviewed Doctor Visits Discussed, Doctor Visits Reviewed  Pharmacy Interventions   Pharmacy Dicussed/Reviewed Pharmacy Topics Discussed, Pharmacy Topics Reviewed  TOC Interventions Today    Flowsheet Row Most Recent Value  TOC Interventions   TOC Interventions Discussed/Reviewed TOC Interventions Discussed, TOC Interventions Reviewed      Gean Maidens BSN RN [facility] [phone]

## 2023-03-12 ENCOUNTER — Telehealth: Payer: Self-pay | Admitting: Internal Medicine

## 2023-03-12 NOTE — Telephone Encounter (Signed)
This is a new prescription to be filled by Dr. Alphonsus Sias, per patient-previous prescriber is retired:  Prescription Request  03/12/2023  LOV: 01/07/2023  What is the name of the medication or equipment?  pantoprazole (PROTONIX) 40 MG tablet   Have you contacted your pharmacy to request a refill? No   Which pharmacy would you like this sent to?  [id number] [address] [place] [address] Phone: [phone] Fax: [phone]  Patient has one week of medication left   Patient notified that their request is being sent to the clinical staff for review and that they should receive a response within 2 business days.   Please advise at [facility] [phone]

## 2023-03-13 DIAGNOSIS — I2510 Atherosclerotic heart disease of native coronary artery without angina pectoris: Secondary | ICD-10-CM | POA: Diagnosis not present

## 2023-03-13 DIAGNOSIS — I4811 Longstanding persistent atrial fibrillation: Secondary | ICD-10-CM | POA: Diagnosis not present

## 2023-03-13 DIAGNOSIS — I4891 Unspecified atrial fibrillation: Secondary | ICD-10-CM | POA: Diagnosis not present

## 2023-03-13 DIAGNOSIS — M6281 Muscle weakness (generalized): Secondary | ICD-10-CM | POA: Diagnosis not present

## 2023-03-13 DIAGNOSIS — R278 Other lack of coordination: Secondary | ICD-10-CM | POA: Diagnosis not present

## 2023-03-13 DIAGNOSIS — Z741 Need for assistance with personal care: Secondary | ICD-10-CM | POA: Diagnosis not present

## 2023-03-13 DIAGNOSIS — R2689 Other abnormalities of gait and mobility: Secondary | ICD-10-CM | POA: Diagnosis not present

## 2023-03-13 DIAGNOSIS — J9621 Acute and chronic respiratory failure with hypoxia: Secondary | ICD-10-CM | POA: Diagnosis not present

## 2023-03-13 DIAGNOSIS — J431 Panlobular emphysema: Secondary | ICD-10-CM | POA: Diagnosis not present

## 2023-03-13 DIAGNOSIS — J441 Chronic obstructive pulmonary disease with (acute) exacerbation: Secondary | ICD-10-CM | POA: Diagnosis not present

## 2023-03-13 DIAGNOSIS — Z9981 Dependence on supplemental oxygen: Secondary | ICD-10-CM | POA: Diagnosis not present

## 2023-03-13 MED ORDER — PANTOPRAZOLE SODIUM 40 MG PO TBEC: 40.0000 mg | DELAYED_RELEASE_TABLET | Freq: Every day | ORAL | 3 refills | Status: DC

## 2023-03-13 NOTE — Telephone Encounter (Signed)
Rx sent electronically.  

## 2023-03-14 ENCOUNTER — Encounter (HOSPITAL_COMMUNITY): Payer: Self-pay | Admitting: Internal Medicine

## 2023-03-14 ENCOUNTER — Ambulatory Visit (HOSPITAL_COMMUNITY)
Admit: 2023-03-14 | Discharge: 2023-03-14 | Disposition: A | Discharge status: Home / Self Care | Payer: No Typology Code available for payment source | Attending: Internal Medicine | Admitting: Internal Medicine

## 2023-03-14 VITALS — BP 124/84 | HR 83 | Ht 70.0 in | Wt 160.4 lb

## 2023-03-14 DIAGNOSIS — I2510 Atherosclerotic heart disease of native coronary artery without angina pectoris: Secondary | ICD-10-CM | POA: Diagnosis not present

## 2023-03-14 DIAGNOSIS — Z79899 Other long term (current) drug therapy: Secondary | ICD-10-CM | POA: Insufficient documentation

## 2023-03-14 DIAGNOSIS — I480 Paroxysmal atrial fibrillation: Secondary | ICD-10-CM | POA: Diagnosis not present

## 2023-03-14 DIAGNOSIS — D6869 Other thrombophilia: Secondary | ICD-10-CM | POA: Diagnosis not present

## 2023-03-14 DIAGNOSIS — I484 Atypical atrial flutter: Secondary | ICD-10-CM | POA: Diagnosis not present

## 2023-03-14 DIAGNOSIS — R9431 Abnormal electrocardiogram [ECG] [EKG]: Secondary | ICD-10-CM | POA: Insufficient documentation

## 2023-03-14 DIAGNOSIS — I4819 Other persistent atrial fibrillation: Secondary | ICD-10-CM | POA: Diagnosis not present

## 2023-03-14 DIAGNOSIS — Z955 Presence of coronary angioplasty implant and graft: Secondary | ICD-10-CM | POA: Insufficient documentation

## 2023-03-14 DIAGNOSIS — J449 Chronic obstructive pulmonary disease, unspecified: Secondary | ICD-10-CM | POA: Diagnosis not present

## 2023-03-14 DIAGNOSIS — Z7901 Long term (current) use of anticoagulants: Secondary | ICD-10-CM | POA: Diagnosis not present

## 2023-03-14 LAB — BASIC METABOLIC PANEL
Anion gap: 11 (ref 5–15)
BUN: 28 mg/dL — ABNORMAL HIGH (ref 8–23)
CO2: 28 mmol/L (ref 22–32)
Calcium: 9 mg/dL (ref 8.9–10.3)
Chloride: 101 mmol/L (ref 98–111)
Creatinine, Ser: 1.4 mg/dL — ABNORMAL HIGH (ref 0.61–1.24)
GFR, Estimated: 50 mL/min — ABNORMAL LOW (ref >60)
Glucose, Bld: 134 mg/dL — ABNORMAL HIGH (ref 70–99)
Potassium: 5.1 mmol/L (ref 3.5–5.1)
Sodium: 140 mmol/L (ref 135–145)

## 2023-03-14 LAB — MAGNESIUM: Magnesium: 2.4 mg/dL (ref 1.7–2.4)

## 2023-03-14 NOTE — Progress Notes (Signed)
Primary Care Physician: [name], MD Primary Cardiologist: Lorine Bears, MD Electrophysiologist: Lanier Prude, MD     Referring Physician: Dr. Jerene Canny. is a 82 y.o. male with a history of HFpEF, CAD s/p PCI, COPD on oxygen, and atrial flutter who presents for consultation in the [facility]. He was seen by Dr. Lalla Brothers in March and recommended Tikosyn load for treatment of arrhythmia. Patient is here today for Tikosyn admission. Patient is on Eliquis for a CHADS2VASC score of 4.  On evaluation today, patient is currently in atrial flutter. Patient is here today for Tikosyn admission. Patient has not started any new medications since pharmacist review. Patient has not taken any benadryl in the past several days. Patient has not missed any doses of anticoagulant.   On follow up 03/14/23, he is currently in NSR. S/p Tikosyn admission 8/5-8/24. He is currently on Tikosyn 250 mcg BID dosage. He chemically converted to normal rhythm and did not require DCCV. Telemetry showed atrial flutter and NSR with occasional PACs. Discontinued digoxin. He feels well overall. No missed doses of Eliquis.   Today, he denies symptoms of palpitations, chest pain, shortness of breath, orthopnea, PND, lower extremity edema, dizziness, presyncope, syncope, snoring, daytime somnolence, bleeding, or neurologic sequela. The patient is tolerating medications without difficulties and is otherwise without complaint today.   he has a BMI of Body mass index is 23.02 kg/m.Marland Kitchen Filed Weights   03/14/23 1516  Weight: 72.8 kg    Current Outpatient Medications  Medication Sig Dispense Refill   acetaminophen (TYLENOL) 325 MG tablet Take 650 mg by mouth every 4 (four) hours as needed.     albuterol (PROVENTIL) (2.5 MG/3ML) 0.083% nebulizer solution Take 2.5 mg by nebulization 2 (two) times daily.     albuterol (VENTOLIN HFA) 108 (90 Base) MCG/ACT inhaler Inhale  2 puffs into the lungs 3 (three) times daily as needed for wheezing or shortness of breath.     apixaban (ELIQUIS) 5 MG TABS tablet Take 1 tablet (5 mg total) by mouth 2 (two) times daily. 180 tablet 3   Budeson-Glycopyrrol-Formoterol (BREZTRI AEROSPHERE) 160-9-4.8 MCG/ACT AERO Inhale 2 puffs into the lungs 2 (two) times daily. 10.7 g 6   clonazePAM (KLONOPIN) 0.5 MG tablet TAKE 1 TABLET BY MOUTH EVERY DAY AS NEEDED FOR ANXIETY (Patient taking differently: Take 0.5 mg by mouth at bedtime.) 30 tablet 0   diltiazem (CARDIZEM CD) 120 MG 24 hr capsule Take 1 capsule (120 mg total) by mouth 2 (two) times daily. 180 capsule 3   Docusate Calcium (STOOL SOFTENER PO) Take 1 tablet by mouth every evening.     dofetilide (TIKOSYN) 250 MCG capsule Take 1 capsule (250 mcg total) by mouth 2 (two) times daily. 60 capsule 5   finasteride (PROSCAR) 5 MG tablet Take 5 mg by mouth daily.     furosemide (LASIX) 20 MG tablet TAKE 1 TABLET BY MOUTH EVERY DAY WITH ADDITIONAL TABLET EVERY OTHER DAY 135 tablet 0   furosemide (LASIX) 40 MG tablet Take 40 mg by mouth every other day.     gabapentin (NEURONTIN) 100 MG capsule Take 200 mg by mouth at bedtime.     ipratropium (ATROVENT) 0.03 % nasal spray Place 2 sprays into both nostrils every 12 (twelve) hours. 30 mL 12   ipratropium-albuterol (DUONEB) 0.5-2.5 (3) MG/3ML SOLN INHALE 3 ML BY NEBULIZER EVERY 6 HOURS AS NEEDED 360 mL 1   nitroGLYCERIN (NITROSTAT)  0.4 MG SL tablet Place 1 tablet (0.4 mg total) under the tongue every 5 (five) minutes as needed for chest pain. 25 tablet 4   OXYGEN Inhale into the lungs. 2 liters upon exertion and 3 liters continuous at night.     pantoprazole (PROTONIX) 40 MG tablet Take 1 tablet (40 mg total) by mouth daily. 90 tablet 3   polyethylene glycol (MIRALAX / GLYCOLAX) 17 g packet Take 17 g by mouth daily as needed for moderate constipation.     predniSONE (DELTASONE) 10 MG tablet Take 1 tablet (10 mg total) by mouth daily with breakfast.  90 tablet 3   rosuvastatin (CRESTOR) 20 MG tablet TAKE 1 TABLET BY MOUTH EVERYDAY AT BEDTIME (Patient taking differently: Take 20 mg by mouth every evening.) 90 tablet 0   silver sulfADIAZINE (SILVADENE) 1 % cream Apply 1 Application topically daily. 50 g 0   No current facility-administered medications for this encounter.    Atrial Fibrillation Management history:  Previous antiarrhythmic drugs: Tikosyn Previous cardioversions: 09/17/22 Previous ablations: None Anticoagulation history: Eliquis  ROS- All systems are reviewed and negative except as per the HPI above.  Physical Exam: Ht 5\' 10"  (1.778 m)   Wt 72.8 kg   BMI 23.02 kg/m   GEN- The patient is well appearing, alert and oriented x 3 today.   Neck - no JVD or carotid bruit noted Lungs- Clear to ausculation bilaterally, normal work of breathing Heart- Regular rate and rhythm, no murmurs, rubs or gallops, PMI not laterally displaced Extremities- no clubbing, cyanosis, or edema Skin - no rash or ecchymosis noted  EKG today demonstrates  Vent. rate 83 BPM PR interval 180 ms QRS duration 82 ms QT/QTcB 384/451 ms P-R-T axes -17 -45 -5 Normal sinus rhythm Left axis deviation Abnormal ECG When compared with ECG of 07-Mar-2023 11:05, PREVIOUS ECG IS PRESENT  Echo 08/06/22 demonstrated   1. Tachycardia noted. Left ventricular ejection fraction, by estimation,  is 65 to 70%. The left ventricle has normal function. The left ventricle  has no regional wall motion abnormalities. There is mild left ventricular  hypertrophy. Left ventricular  diastolic parameters are indeterminate. There is the interventricular  septum is flattened in systole and diastole, consistent with right  ventricular pressure and volume overload.   2. Right ventricular systolic function is normal. The right ventricular  size is normal. Tricuspid regurgitation signal is inadequate for assessing  PA pressure.   3. The mitral valve is normal in structure.  No evidence of mitral valve  regurgitation. No evidence of mitral stenosis.   4. The aortic valve is normal in structure. Aortic valve regurgitation is  not visualized. No aortic stenosis is present.   5. The inferior vena cava is normal in size with greater than 50%  respiratory variability, suggesting right atrial pressure of 3 mmHg.    ASSESSMENT & PLAN CHA2DS2-VASc Score = 4  The patient's score is based upon: CHF History: 0 HTN History: 1 Diabetes History: 0 Stroke History: 0 Vascular Disease History: 1 Age Score: 2 Gender Score: 0      ASSESSMENT AND PLAN: Persistent atypical atrial flutter The patient's CHA2DS2-VASc score is 4, indicating a 4.8% annual risk of stroke.   Patient s/p dofetilide admission 8/5-8/24.  He is currently in NSR.  Qtc interval stable. Continue Tikosyn 250 mcg BID. Continue diltiazem 120 mg daily.   Secondary Hypercoagulable State (ICD10:  D68.69) The patient is at significant risk for stroke/thromboembolism based upon his CHA2DS2-VASc Score of 4.  Continue Apixaban (Eliquis).  No missed doses.    Patient will f/u in 1 month for Tikosyn surveillance with Mardelle Matte and per his request would like to follow up in [place] due to proximity to home.    Lake Bells, PA-C  Af[facility] [address] [place], [place] [address] [phone]

## 2023-03-15 DIAGNOSIS — Z9981 Dependence on supplemental oxygen: Secondary | ICD-10-CM | POA: Diagnosis not present

## 2023-03-15 DIAGNOSIS — M6281 Muscle weakness (generalized): Secondary | ICD-10-CM | POA: Diagnosis not present

## 2023-03-15 DIAGNOSIS — Z741 Need for assistance with personal care: Secondary | ICD-10-CM | POA: Diagnosis not present

## 2023-03-15 DIAGNOSIS — J431 Panlobular emphysema: Secondary | ICD-10-CM | POA: Diagnosis not present

## 2023-03-15 DIAGNOSIS — J9621 Acute and chronic respiratory failure with hypoxia: Secondary | ICD-10-CM | POA: Diagnosis not present

## 2023-03-15 DIAGNOSIS — R2689 Other abnormalities of gait and mobility: Secondary | ICD-10-CM | POA: Diagnosis not present

## 2023-03-15 DIAGNOSIS — I4891 Unspecified atrial fibrillation: Secondary | ICD-10-CM | POA: Diagnosis not present

## 2023-03-15 DIAGNOSIS — R278 Other lack of coordination: Secondary | ICD-10-CM | POA: Diagnosis not present

## 2023-03-15 DIAGNOSIS — I4811 Longstanding persistent atrial fibrillation: Secondary | ICD-10-CM | POA: Diagnosis not present

## 2023-03-15 DIAGNOSIS — I2510 Atherosclerotic heart disease of native coronary artery without angina pectoris: Secondary | ICD-10-CM | POA: Diagnosis not present

## 2023-03-15 DIAGNOSIS — J441 Chronic obstructive pulmonary disease with (acute) exacerbation: Secondary | ICD-10-CM | POA: Diagnosis not present

## 2023-03-16 ENCOUNTER — Encounter: Payer: Self-pay | Admitting: Internal Medicine

## 2023-03-17 DIAGNOSIS — J449 Chronic obstructive pulmonary disease, unspecified: Secondary | ICD-10-CM | POA: Diagnosis not present

## 2023-03-17 DIAGNOSIS — J9611 Chronic respiratory failure with hypoxia: Secondary | ICD-10-CM | POA: Diagnosis not present

## 2023-03-18 ENCOUNTER — Ambulatory Visit: Payer: Self-pay

## 2023-03-18 ENCOUNTER — Encounter (HOSPITAL_COMMUNITY): Payer: Self-pay

## 2023-03-18 DIAGNOSIS — R2689 Other abnormalities of gait and mobility: Secondary | ICD-10-CM | POA: Diagnosis not present

## 2023-03-18 DIAGNOSIS — Z9981 Dependence on supplemental oxygen: Secondary | ICD-10-CM | POA: Diagnosis not present

## 2023-03-18 DIAGNOSIS — Z741 Need for assistance with personal care: Secondary | ICD-10-CM | POA: Diagnosis not present

## 2023-03-18 DIAGNOSIS — R278 Other lack of coordination: Secondary | ICD-10-CM | POA: Diagnosis not present

## 2023-03-18 DIAGNOSIS — I4811 Longstanding persistent atrial fibrillation: Secondary | ICD-10-CM | POA: Diagnosis not present

## 2023-03-18 DIAGNOSIS — I2510 Atherosclerotic heart disease of native coronary artery without angina pectoris: Secondary | ICD-10-CM | POA: Diagnosis not present

## 2023-03-18 DIAGNOSIS — J9621 Acute and chronic respiratory failure with hypoxia: Secondary | ICD-10-CM | POA: Diagnosis not present

## 2023-03-18 DIAGNOSIS — J441 Chronic obstructive pulmonary disease with (acute) exacerbation: Secondary | ICD-10-CM | POA: Diagnosis not present

## 2023-03-18 DIAGNOSIS — I4891 Unspecified atrial fibrillation: Secondary | ICD-10-CM | POA: Diagnosis not present

## 2023-03-18 DIAGNOSIS — M6281 Muscle weakness (generalized): Secondary | ICD-10-CM | POA: Diagnosis not present

## 2023-03-18 DIAGNOSIS — J431 Panlobular emphysema: Secondary | ICD-10-CM | POA: Diagnosis not present

## 2023-03-18 MED ORDER — DOFETILIDE 250 MCG PO CAPS: 250.0000 ug | ORAL_CAPSULE | Freq: Two times a day (BID) | ORAL | 5 refills | Status: DC

## 2023-03-18 NOTE — Patient Instructions (Signed)
Visit Information  Thank you for taking time to visit with me today. Please don't hesitate to contact me if I can be of assistance to you.   Following are the goals we discussed today:  Continue to take medications as prescribed.  Monitor pulse rate regularly reporting readings outside of parameter to provider.  Keep follow up appointments to provider as needed Review education articles on Atrial fibrillation and COPD attached.   Our next appointment is by telephone on 04/19/23 at 10 am  Please call the care guide team at [phone] if you need to cancel or reschedule your appointment.   If you are experiencing a Mental Health or Behavioral Health Crisis or need someone to talk to, please call the Suicide and Crisis Lifeline: 988 call 1-800-273-TALK (toll free, 24 hour hotline)  Patient verbalizes understanding of instructions and care plan provided today and agrees to view in MyChart. Active MyChart status and patient understanding of how to access instructions and care plan via MyChart confirmed with patient.     [name] Ina RN,BSN,CCM [facility] Care Coordination [phone] direct line  Atrial Fibrillation Atrial fibrillation (AFib) is a type of heartbeat that is irregular or fast. If you have AFib, your heart beats without any order. This makes it hard for your heart to pump blood in a normal way. AFib may come and go, or it may become a long-lasting problem. If AFib is not treated, it can put you at higher risk for stroke, heart failure, and other heart problems. What are the causes? AFib may be caused by diseases that damage the heart's electrical system. They include: High blood pressure. Heart failure. Heart valve diseases. Heart surgery. Diabetes. Thyroid disease. Kidney disease. Lung diseases, such as pneumonia or COPD. Sleep apnea. Sometimes the cause is not known. What increases the risk? You are more likely to develop AFib if: You are older. You exercise often and  very hard. You have a family history of AFib. You are male. You are Caucasian. You are overweight. You smoke. You drink a lot of alcohol. What are the signs or symptoms? Common symptoms of this condition include: A feeling that your heart is beating very fast. Chest pain or discomfort. Feeling short of breath. Suddenly feeling light-headed or weak. Getting tired easily during activity. Fainting. Sweating. In some cases, there are no symptoms. How is this treated? Medicines to: Prevent blood clots. Treat heart rate or heart rhythm problems. Using devices, such as a pacemaker, to correct heart rhythm problems. Doing surgery to remove the part of the heart that sends bad signals. Closing an area where clots can form in the heart (left atrial appendage). In some cases, your doctor will treat other underlying conditions. Follow these instructions at home: Medicines Take over-the-counter and prescription medicines only as told by your doctor. Do not take any new medicines without first talking to your doctor. If you are taking blood thinners: Talk with your doctor before taking aspirin or NSAIDs, such as ibuprofen. Take your medicines as told. Take them at the same time each day. Do not do things that could hurt or bruise you. Be careful to avoid falls. Wear an alert bracelet or carry a card that says you take blood thinners. Lifestyle Do not smoke or use any products that contain nicotine or tobacco. If you need help quitting, ask your doctor. Eat heart-healthy foods. Talk with your doctor about the right eating plan for you. Exercise regularly as told by your doctor. Do not drink alcohol. Lose  weight if you are overweight. General instructions If you have sleep apnea, treat it as told by your doctor. Do not use diet pills unless your doctor says they are safe for you. Diet pills may make heart problems worse. Keep all follow-up visits. Your doctor will check your heart rate and  rhythm regularly. Contact a doctor if: You notice a change in the speed, rhythm, or strength of your heartbeat. You are taking a blood-thinning medicine and you get more bruising. You get tired more easily when you move or exercise. You have a sudden change in weight. Get help right away if:  You have pain in your chest. You have trouble breathing. You have side effects of blood thinners, such as blood in your vomit, poop (stool), or pee (urine), or bleeding that cannot stop. You have any signs of a stroke. "BE FAST" is an easy way to remember the main warning signs: B - Balance. Dizziness, sudden trouble walking, or loss of balance. E - Eyes. Trouble seeing or a change in how you see. F - Face. Sudden weakness or loss of feeling in the face. The face or eyelid may droop on one side. A - Arms.Weakness or loss of feeling in an arm. This happens suddenly and usually on one side of the body. S - Speech. Sudden trouble speaking, slurred speech, or trouble understanding what people say. T - Time.Time to call emergency services. Write down what time symptoms started. You have other signs of a stroke, such as: A sudden, very bad headache with no known cause. Feeling like you may vomit (nausea). Vomiting. A seizure. These symptoms may be an emergency. Get help right away. Call 911. Do not wait to see if the symptoms will go away. Do not drive yourself to the hospital. This information is not intended to replace advice given to you by your health care provider. Make sure you discuss any questions you have with your health care provider. Document Revised: 04/04/2022 Document Reviewed: 04/04/2022 Elsevier Patient Education  2024 Elsevier Inc.  Living with COPD Being diagnosed with chronic obstructive pulmonary disease (COPD) changes your life physically and emotionally. Having COPD can affect your ability to work and do things you enjoy. COPD is not the same for everyone, and it may change over  time. Your health care providers can help you come up with the COPD management plan that works best for you. How to manage lifestyle changes Treatment plan Work closely with your health care providers. Follow your COPD management plan. This plan includes: Instructions about activities, exercises, diet, medicines, what to do when COPD flares up, and when to call your health care provider. A pulmonary rehabilitation program. In pulmonary rehab, you will learn about COPD, do exercises for fitness and breathing, and get support from health care providers and other people who have COPD. Managing emotions and stress Living with a chronic disease means you may also struggle with stressful emotions, such as sadness, fear, and worry. Here are some ways to manage these emotions: Talk to someone about your fear, anxiety, depression, or stress. Learn strategies to avoid or reduce stress and ask for help if you are struggling with depression or anxiety. Consider joining a COPD support group, online or in person.  Adjusting to changes COPD may limit the things you can do, but you can make certain changes to help you cope with the diagnosis. Ask for help when you need it. Getting support from friends, family, and your health care team is  an important part of managing the condition. Try to get regular exercise as prescribed by a health care provider or pulmonary rehab team. Exercising can help COPD, even if you are a bit short of breath. Take steps to prevent infection and protect your lungs: Wash your hands often and avoid being in crowds. Stay away from friends and family members who are sick. Check your local air quality each day, and stay out of areas where air pollution is likely. How to recognize changes in your condition Recognizing changes in your COPD COPD is a progressive disease. It is important to let the health care team know if your COPD is getting worse. Your treatment plan may need to change.  Watch for: Increased shortness of breath, wheezing, cough, or fatigue. Loss of ability to exercise or perform daily activities, like climbing stairs. More frequent symptom flares. Signs of depression or anxiety. Recognizing stress It is normal to have additional stress when you have COPD. However, prolonged stress and anxiety can make COPD worse and lead to depression. Recognize the warning signs, which include: Feeling sad or worried more often or most of the time. Having less energy and losing interest in pleasurable activities. Changes in your appetite or sleeping patterns. Being easily angered or irritated. Having unexplained aches and pains, digestive problems, or headaches. Follow these instructions at home: Eating and drinking  Eat foods that are high in fiber, such as fresh fruits and vegetables, whole grains, and beans. Limit foods that are high in fat and processed sugars, such as fried or sweet foods. Follow a balanced diet and maintain a healthy weight. Being overweight or underweight can make COPD worse. You may work with a Data processing manager as part of your pulmonary rehab program. Drink enough fluid to keep your urine pale yellow. If you drink alcohol: Limit how much you have to: 0-1 drink a day for women who are not pregnant. 0-2 drinks a day for men. Know how much alcohol is in your drink. In the [place] one drink equals one 12 oz bottle of beer (355 mL), one 5 oz glass of wine (148 mL), or one 1 oz glass of hard liquor (44 mL). Lifestyle If you smoke, the most important thing that you can do is to stop smoking. Continuing to smoke will cause the disease to progress faster. Do not use any products that contain nicotine or tobacco. These products include cigarettes, chewing tobacco, and vaping devices, such as e-cigarettes. If you need help quitting, ask your health care provider. Avoid exposure to things that irritate your lungs, such as smoke, chemicals, and  fumes. Activity Balance exercise and rest. Take short walks every 1-2 hours. This is important to improve blood flow and breathing. Ask for help if you feel weak or unsteady. Do exercises that include controlled breathing with body movement, such as tai chi. General instructions Take over-the-counter and prescription medicines only as told by your health care provider. Take vitamin and protein supplements as told by your health care provider or dietitian. Practice good oral hygiene and see your dental care provider regularly. An oral infection can also spread to your lungs. Make sure you receive all the vaccines that your health care provider recommends. Keep all follow-up visits. This is important. Contact a health care provider if you: Are struggling to manage your COPD. Have emotional stress that interferes with your ability to cope with COPD. Get help right away if you: Have thoughts of suicide, death, or hurting yourself or others. If you  ever feel like you may hurt yourself or others, or have thoughts about taking your own life, get help right away. Go to your nearest emergency department or: Call your local emergency services (911 in the [place] Call a suicide crisis helpline, such as the National Suicide Prevention Lifeline at 269-474-2542 or 988 in the [place] This is open 24 hours a day in the [place] Text the Crisis Text Line at (412)665-8472 (in the [place] Summary Being diagnosed with chronic obstructive pulmonary disease (COPD) changes your life physically and emotionally. Work with your health care providers and follow your COPD management plan. A pulmonary rehabilitation program is an important part of COPD management. Prolonged stress, anxiety, and depression can make COPD worse. Let your health care provider know if emotional stress interferes with your ability to cope with and manage COPD. This information is not intended to replace advice given to you by your health care provider.  Make sure you discuss any questions you have with your health care provider. Document Revised: 02/08/2021 Document Reviewed: 08/03/2020 Elsevier Patient Education  2024 [employer].

## 2023-03-18 NOTE — Patient Outreach (Signed)
  Care Coordination   Initial Visit Note   03/18/2023 Name: [name] [name]. MRN: [id number] DOB: 1941-07-01  [name] [name]. is a 82 y.o. year old male who sees [name], MD for primary care. I spoke with  [name] [name]. by phone today.  What matters to the patients health and wellness today?   A-fibrillation: Patient reports having recent admission for Tykosin administration due to A-fib.  He states he is doing well on the medication. He reports having follow up with provider since admission.  Patient states he sent a message to the cardiology office requesting refill of Tykosin due to having 2 weeks of medication left.  Patient states he is not monitoring his pulse at home. He states he goes to outpatient PT 3 x per week and therapist checks his pulse rate.  Patient states his strength and endurance has increased over the last several months with ongoing therapy and medication management.   COPD:  Patient states he has severe COPD.  He reports taking his medications and using his inhaler/ nebulizer as prescribed.  Patient reports COPD symptoms are being managed at this time and denies any increase in symptoms.    Goals Addressed             This Visit's Progress    Management and education of chronic health conditions       Interventions Today    Flowsheet Row Most Recent Value  Chronic Disease   Chronic disease during today's visit Atrial Fibrillation (AFib), Chronic Obstructive Pulmonary Disease (COPD)  General Interventions   General Interventions Discussed/Reviewed General Interventions Reviewed, Doctor Visits  [evaluation of current treatment plan for A-fib / COPD and patients adherence to plan as established by provider.  Assessed for A-fib / COPD symptoms.]  Doctor Visits Discussed/Reviewed Doctor Visits Reviewed  Annabell Sabal upcoming provider visits. Advised to keep follow up appointments as scheduled. Confirmed patient had follow up with provider post  Tykosin admission.]  Exercise Interventions   Exercise Discussed/Reviewed Physical Activity  [Assessed patients activity level.]  Education Interventions   Education Provided Provided Education, Provided Printed Education  [Discussed atrial fibrillation symptoms. Education articles sent to patient in Mychart on A-fib and COPD management.  Advised to monitor pulse rate daily and notify provider for pulse rate outside of established parameter.]  Provided Verbal Education On Other  [Reviewed atrial fibrillation symptoms.  Advised to report mild symptoms to provider. Advised to call 911 for severe symptoms.]  Pharmacy Interventions   Pharmacy Dicussed/Reviewed Pharmacy Topics Reviewed  [medications reviewed and compliance discussed. Confirmed with patient his request regarding Tikosyn refill has been addressed.  Advised patient to contact RN case manager if he has not heard back from provider office regarding refill.]              SDOH assessments and interventions completed:  Yes  SDOH Interventions Today    Flowsheet Row Most Recent Value  SDOH Interventions   Food Insecurity Interventions Intervention Not Indicated  Housing Interventions Intervention Not Indicated  Transportation Interventions Intervention Not Indicated        Care Coordination Interventions:  No, not indicated   Follow up plan: Follow up call scheduled for 04/19/23    Encounter Outcome:  Pt. Visit Completed   George Ina RN,BSN,CCM [facility] Care Coordination [phone] direct line

## 2023-03-18 NOTE — Telephone Encounter (Signed)
Would recommend that he NOT drink any more tonic water.  Quinine is a high risk drug for prolonging QT interval, as is Tikosyn.  It would be too hard to monitor his daily intake - different amounts each day could potentially cause a QT prolongation.

## 2023-03-20 ENCOUNTER — Other Ambulatory Visit (HOSPITAL_COMMUNITY): Payer: Self-pay

## 2023-03-20 DIAGNOSIS — R2689 Other abnormalities of gait and mobility: Secondary | ICD-10-CM | POA: Diagnosis not present

## 2023-03-20 DIAGNOSIS — M6281 Muscle weakness (generalized): Secondary | ICD-10-CM | POA: Diagnosis not present

## 2023-03-20 DIAGNOSIS — I2510 Atherosclerotic heart disease of native coronary artery without angina pectoris: Secondary | ICD-10-CM | POA: Diagnosis not present

## 2023-03-20 DIAGNOSIS — Z9981 Dependence on supplemental oxygen: Secondary | ICD-10-CM | POA: Diagnosis not present

## 2023-03-20 DIAGNOSIS — Z741 Need for assistance with personal care: Secondary | ICD-10-CM | POA: Diagnosis not present

## 2023-03-20 DIAGNOSIS — I4811 Longstanding persistent atrial fibrillation: Secondary | ICD-10-CM | POA: Diagnosis not present

## 2023-03-20 DIAGNOSIS — I4891 Unspecified atrial fibrillation: Secondary | ICD-10-CM | POA: Diagnosis not present

## 2023-03-20 DIAGNOSIS — R278 Other lack of coordination: Secondary | ICD-10-CM | POA: Diagnosis not present

## 2023-03-20 DIAGNOSIS — J431 Panlobular emphysema: Secondary | ICD-10-CM | POA: Diagnosis not present

## 2023-03-20 DIAGNOSIS — J9621 Acute and chronic respiratory failure with hypoxia: Secondary | ICD-10-CM | POA: Diagnosis not present

## 2023-03-20 DIAGNOSIS — J441 Chronic obstructive pulmonary disease with (acute) exacerbation: Secondary | ICD-10-CM | POA: Diagnosis not present

## 2023-03-21 ENCOUNTER — Telehealth: Payer: Self-pay | Admitting: Cardiology

## 2023-03-21 MED ORDER — DOFETILIDE 250 MCG PO CAPS: 250.0000 ug | ORAL_CAPSULE | Freq: Two times a day (BID) | ORAL | 2 refills | Status: DC

## 2023-03-21 NOTE — Telephone Encounter (Signed)
*  STAT* If patient is at the pharmacy, call can be transferred to refill team.   1. Which medications need to be refilled? (please list name of each medication and dose if known) Dofetilide   2. Would you like to learn more about the convenience, safety, & potential cost savings by using the [facility]?      3. Are you open to using the [facility] (Type [facility].    4. Which pharmacy/location (including street and city if local pharmacy) is medication to be sent to? [facility] [address]   5. Do they need a 30 day or 90 day supply? 90 days # 180  and refills- need this called in asap- need this medicine asap please

## 2023-03-21 NOTE — Telephone Encounter (Signed)
Requested Prescriptions   Signed Prescriptions Disp Refills   dofetilide (TIKOSYN) 250 MCG capsule 180 capsule 2    Sig: Take 1 capsule (250 mcg total) by mouth 2 (two) times daily.    Authorizing Provider: Sherie Don    Ordering User: Kendrick Fries

## 2023-03-22 DIAGNOSIS — I2510 Atherosclerotic heart disease of native coronary artery without angina pectoris: Secondary | ICD-10-CM | POA: Diagnosis not present

## 2023-03-22 DIAGNOSIS — J441 Chronic obstructive pulmonary disease with (acute) exacerbation: Secondary | ICD-10-CM | POA: Diagnosis not present

## 2023-03-22 DIAGNOSIS — R2689 Other abnormalities of gait and mobility: Secondary | ICD-10-CM | POA: Diagnosis not present

## 2023-03-22 DIAGNOSIS — I4891 Unspecified atrial fibrillation: Secondary | ICD-10-CM | POA: Diagnosis not present

## 2023-03-22 DIAGNOSIS — R278 Other lack of coordination: Secondary | ICD-10-CM | POA: Diagnosis not present

## 2023-03-22 DIAGNOSIS — Z9981 Dependence on supplemental oxygen: Secondary | ICD-10-CM | POA: Diagnosis not present

## 2023-03-22 DIAGNOSIS — Z741 Need for assistance with personal care: Secondary | ICD-10-CM | POA: Diagnosis not present

## 2023-03-22 DIAGNOSIS — I4811 Longstanding persistent atrial fibrillation: Secondary | ICD-10-CM | POA: Diagnosis not present

## 2023-03-22 DIAGNOSIS — J431 Panlobular emphysema: Secondary | ICD-10-CM | POA: Diagnosis not present

## 2023-03-22 DIAGNOSIS — J9621 Acute and chronic respiratory failure with hypoxia: Secondary | ICD-10-CM | POA: Diagnosis not present

## 2023-03-22 DIAGNOSIS — M6281 Muscle weakness (generalized): Secondary | ICD-10-CM | POA: Diagnosis not present

## 2023-03-25 ENCOUNTER — Encounter: Payer: Self-pay | Admitting: Cardiology

## 2023-03-25 DIAGNOSIS — Z741 Need for assistance with personal care: Secondary | ICD-10-CM | POA: Diagnosis not present

## 2023-03-25 DIAGNOSIS — R278 Other lack of coordination: Secondary | ICD-10-CM | POA: Diagnosis not present

## 2023-03-25 DIAGNOSIS — J9621 Acute and chronic respiratory failure with hypoxia: Secondary | ICD-10-CM | POA: Diagnosis not present

## 2023-03-25 DIAGNOSIS — Z9981 Dependence on supplemental oxygen: Secondary | ICD-10-CM | POA: Diagnosis not present

## 2023-03-25 DIAGNOSIS — J441 Chronic obstructive pulmonary disease with (acute) exacerbation: Secondary | ICD-10-CM | POA: Diagnosis not present

## 2023-03-25 DIAGNOSIS — I2510 Atherosclerotic heart disease of native coronary artery without angina pectoris: Secondary | ICD-10-CM | POA: Diagnosis not present

## 2023-03-25 DIAGNOSIS — J431 Panlobular emphysema: Secondary | ICD-10-CM | POA: Diagnosis not present

## 2023-03-25 DIAGNOSIS — R2689 Other abnormalities of gait and mobility: Secondary | ICD-10-CM | POA: Diagnosis not present

## 2023-03-25 DIAGNOSIS — I4891 Unspecified atrial fibrillation: Secondary | ICD-10-CM | POA: Diagnosis not present

## 2023-03-25 DIAGNOSIS — M6281 Muscle weakness (generalized): Secondary | ICD-10-CM | POA: Diagnosis not present

## 2023-03-25 DIAGNOSIS — I4811 Longstanding persistent atrial fibrillation: Secondary | ICD-10-CM | POA: Diagnosis not present

## 2023-03-26 ENCOUNTER — Ambulatory Visit (INDEPENDENT_AMBULATORY_CARE_PROVIDER_SITE_OTHER): Payer: Medicare Other | Admitting: Podiatry

## 2023-03-26 DIAGNOSIS — L97521 Non-pressure chronic ulcer of other part of left foot limited to breakdown of skin: Secondary | ICD-10-CM | POA: Diagnosis not present

## 2023-03-26 NOTE — Progress Notes (Signed)
Subjective:  Patient ID: [name] [name]., male    DOB: 05-31-1941,  MRN: [id number]  Chief Complaint  Patient presents with   Foot Ulcer    82 y.o. male presents with the above complaint.  Patient presents with follow-up of left second digit ulceration.  She send he states is doing a lot better.  Silvadene cream helped.   Review of Systems: Negative except as noted in the HPI. Denies N/V/F/Ch.  Past Medical History:  Diagnosis Date   Abdominal discomfort 12/07/2020   Acquired trigger finger of right middle finger 02/04/2020   Acute prostatitis 04/08/2018   Alcohol abuse    Alcohol dependence (HCC) 12/07/2020   Anal or rectal pain 04/08/2018   Annual physical exam 12/07/2020   Anorectal disorder 12/07/2020   Arthritis    Atherosclerotic heart disease of native coronary artery without angina pectoris 04/08/2018   a. 03/2018 PCI: LM nl, LAD 10m, LCX nl, OM1 70p (2.75x8 Synergy DES), 20m (2.5x24 Synergy DES), RCA 25p, RPL1 50. EF 55-65%.   Atrial flutter (HCC)    a. 07/2022 in setting of hospitalization for resp illness/RSV. CHA2DS2VASc = 4-->eliquis; b. 08/2022 s/p DCCV (100J); c. 09/26/2022 Recurrent Aflutter.   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 02/2022 Carotid U/S: 1-39% bilat ICA stenoses.   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   Contracture of palmar fascia 12/07/2020   COPD (chronic obstructive pulmonary disease) (HCC)    Corn of toe 12/07/2020   Corns and callosities 04/09/2018   Coronary artery disease     2019 with stents   Coronary atherosclerosis 05/07/2018   Degenerative disc disease, 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   Degenerative disc disease, 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   Diarrhea 04/09/2018   Double vision with both eyes open 12/07/2020   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   Dysphagia 08/14/2018   Dyspnea    ED (erectile dysfunction)    ED (erectile dysfunction) of organic origin 12/07/2020   Elevated prostate specific antigen (PSA) 04/08/2018   Elevated PSA    Encounter for screening for other disorder 12/07/2020   Enlarged prostate without lower urinary tract symptoms (luts) 04/09/2018   Enthesopathy 12/07/2020   Essential (primary) hypertension 04/08/2018   Ex-smoker 04/09/2018   Exertional dyspnea 04/02/2018   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 07/2022 Echo: EF 65-70%, no rwma, mild LVH, nl RV fxn.   Hyperlipidemia    Hypertension    Hypoxemia 12/07/2020   Kidney cysts    Left knee pain 12/07/2020   Low back pain 04/08/2018   Male erectile dysfunction 04/09/2018   Mixed hyperlipidemia 04/08/2018   Nocturia more than twice per night 04/09/2018   Osteoarthritis of first carpometacarpal joint 04/08/2018   Osteoarthritis of knee 12/07/2020   Other long term (current) drug therapy 12/07/2020   Pain due to onychomycosis of toenail of left foot 07/20/2019   Pain in  joint 04/09/2018   Pain in knee 04/09/2018   Pain in unspecified knee 12/07/2020   Pain of finger 04/09/2018   Palpitations    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HCC) 12/07/2020   Personal history of colonic polyps 12/07/2020   Pneumonia    Pneumothorax 04/12/2020   Polypharmacy 04/09/2018   Porokeratosis 01/13/2019   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HCC)    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   Pulmonary nodules  06/08/2016   Pure hypercholesterolemia 12/07/2020   Renal cyst 12/07/2020   Sleep disorder 04/08/2018   Status post bronchoscopy 04/12/2020   Tear of rotator cuff 04/09/2018   Tobacco user 12/07/2020   Uncomplicated alcohol dependence (HCC) 04/09/2018   Unspecified rotator cuff tear or rupture of unspecified shoulder, not specified as traumatic 12/07/2020   Unspecified tear of unspecified meniscus, current injury, unspecified knee, initial encounter 12/07/2020   Visual disturbance 12/07/2020   Vitamin D deficiency 04/08/2018    Current Outpatient Medications:    acetaminophen (TYLENOL) 325 MG tablet, Take 650 mg by mouth every 4 (four) hours as needed., Disp: , Rfl:    albuterol (PROVENTIL) (2.5 MG/3ML) 0.083% nebulizer solution, Take 2.5 mg by nebulization 2 (two) times daily., Disp: , Rfl:    albuterol (VENTOLIN HFA) 108 (90 Base) MCG/ACT inhaler, Inhale 2 puffs into the lungs 3 (three) times daily as needed for wheezing or shortness of breath., Disp: , Rfl:    apixaban (ELIQUIS) 5 MG TABS tablet, Take 1 tablet (5 mg total) by mouth 2 (two) times daily., Disp: 180 tablet, Rfl: 3   Budeson-Glycopyrrol-Formoterol (BREZTRI AEROSPHERE) 160-9-4.8 MCG/ACT AERO, Inhale 2 puffs into the lungs 2 (two) times daily., Disp: 10.7 g, Rfl: 6   clonazePAM (KLONOPIN) 0.5 MG tablet, TAKE 1 TABLET BY MOUTH EVERY DAY AS NEEDED FOR ANXIETY (Patient taking differently: Take 0.5 mg by mouth at bedtime.), Disp: 30 tablet, Rfl: 0   diltiazem (CARDIZEM CD) 120 MG 24 hr capsule, Take 1 capsule (120 mg total) by mouth 2 (two) times daily., Disp: 180 capsule, Rfl: 3   Docusate Calcium (STOOL SOFTENER PO), Take 1 tablet by mouth every evening., Disp: , Rfl:    dofetilide (TIKOSYN) 250 MCG capsule, Take 1 capsule (250 mcg total) by mouth 2 (two) times daily., Disp: 180 capsule, Rfl: 2   finasteride (PROSCAR) 5 MG tablet, Take 5 mg by mouth daily., Disp: , Rfl:    furosemide (LASIX) 20 MG tablet, TAKE 1 TABLET BY MOUTH  EVERY DAY WITH ADDITIONAL TABLET EVERY OTHER DAY, Disp: 135 tablet, Rfl: 0   furosemide (LASIX) 40 MG tablet, Take 40 mg by mouth every other day., Disp: , Rfl:    gabapentin (NEURONTIN) 100 MG capsule, Take 200 mg by mouth at bedtime., Disp: , Rfl:    ipratropium (ATROVENT) 0.03 % nasal spray, Place 2 sprays into both nostrils every 12 (twelve) hours., Disp: 30 mL, Rfl: 12   ipratropium-albuterol (DUONEB) 0.5-2.5 (3) MG/3ML SOLN, INHALE 3 ML BY NEBULIZER EVERY 6 HOURS AS NEEDED, Disp: 360 mL, Rfl: 1   nitroGLYCERIN (NITROSTAT) 0.4 MG SL tablet, Place 1 tablet (0.4 mg total) under the tongue every 5 (five) minutes as needed for chest pain., Disp: 25 tablet, Rfl: 4   OXYGEN, Inhale into the lungs. 2 liters upon exertion and 3 liters continuous at night., Disp: , Rfl:    pantoprazole (PROTONIX) 40 MG tablet, Take 1 tablet (40 mg total) by mouth daily., Disp: 90 tablet, Rfl: 3   polyethylene glycol (MIRALAX / GLYCOLAX) 17 g packet, Take 17 g  by mouth daily as needed for moderate constipation., Disp: , Rfl:    predniSONE (DELTASONE) 10 MG tablet, Take 1 tablet (10 mg total) by mouth daily with breakfast., Disp: 90 tablet, Rfl: 3   rosuvastatin (CRESTOR) 20 MG tablet, TAKE 1 TABLET BY MOUTH EVERYDAY AT BEDTIME (Patient taking differently: Take 20 mg by mouth every evening.), Disp: 90 tablet, Rfl: 0   silver sulfADIAZINE (SILVADENE) 1 % cream, Apply 1 Application topically daily., Disp: 50 g, Rfl: 0  Social History   Tobacco Use  Smoking Status Former   Current packs/day: 0.00   Average packs/day: 0.8 packs/day for 68.0 years (51.0 ttl pk-yrs)   Types: Cigarettes   Start date: 46   Quit date: 07/13/2020   Years since quitting: 2.7   Passive exposure: Never  Smokeless Tobacco Never  Tobacco Comments   Recent Quit      Allergies  Allergen Reactions   Flagyl [Metronidazole] Other (See Comments)    Other reaction(s): skin reaction   Objective:  There were no vitals filed for this  visit. There is no height or weight on file to calculate BMI. Constitutional Well developed. Well nourished.  Vascular Dorsalis pedis pulses palpable bilaterally. Posterior tibial pulses palpable bilaterally. Capillary refill normal to all digits.  No cyanosis or clubbing noted. Pedal hair growth normal.  Neurologic Normal speech. Oriented to person, place, and time. Epicritic sensation to light touch grossly present bilaterally.  Dermatologic Skin also completed completely reepithelialized.  No signs of wound noted.  No erythema noted no infection noted.  Orthopedic: Normal joint ROM without pain or crepitus bilaterally. No visible deformities. No bony tenderness.   Radiographs: None Assessment:   No diagnosis found.  Plan:  Patient was evaluated and treated and all questions answered.  Left second digit ulceration limited to the breakdown of the skin -Clinically healed officially discharged from my care if any foot and ankle issues were answered he will come back and see me.  I discussed shoe gear modification.  No follow-ups on file.

## 2023-03-27 DIAGNOSIS — Z741 Need for assistance with personal care: Secondary | ICD-10-CM | POA: Diagnosis not present

## 2023-03-27 DIAGNOSIS — M6281 Muscle weakness (generalized): Secondary | ICD-10-CM | POA: Diagnosis not present

## 2023-03-27 DIAGNOSIS — I4811 Longstanding persistent atrial fibrillation: Secondary | ICD-10-CM | POA: Diagnosis not present

## 2023-03-27 DIAGNOSIS — J441 Chronic obstructive pulmonary disease with (acute) exacerbation: Secondary | ICD-10-CM | POA: Diagnosis not present

## 2023-03-27 DIAGNOSIS — I4891 Unspecified atrial fibrillation: Secondary | ICD-10-CM | POA: Diagnosis not present

## 2023-03-27 DIAGNOSIS — R278 Other lack of coordination: Secondary | ICD-10-CM | POA: Diagnosis not present

## 2023-03-27 DIAGNOSIS — I2510 Atherosclerotic heart disease of native coronary artery without angina pectoris: Secondary | ICD-10-CM | POA: Diagnosis not present

## 2023-03-27 DIAGNOSIS — J9621 Acute and chronic respiratory failure with hypoxia: Secondary | ICD-10-CM | POA: Diagnosis not present

## 2023-03-27 DIAGNOSIS — Z9981 Dependence on supplemental oxygen: Secondary | ICD-10-CM | POA: Diagnosis not present

## 2023-03-27 DIAGNOSIS — R2689 Other abnormalities of gait and mobility: Secondary | ICD-10-CM | POA: Diagnosis not present

## 2023-03-27 DIAGNOSIS — J431 Panlobular emphysema: Secondary | ICD-10-CM | POA: Diagnosis not present

## 2023-03-29 DIAGNOSIS — Z741 Need for assistance with personal care: Secondary | ICD-10-CM | POA: Diagnosis not present

## 2023-03-29 DIAGNOSIS — I4891 Unspecified atrial fibrillation: Secondary | ICD-10-CM | POA: Diagnosis not present

## 2023-03-29 DIAGNOSIS — R2689 Other abnormalities of gait and mobility: Secondary | ICD-10-CM | POA: Diagnosis not present

## 2023-03-29 DIAGNOSIS — J431 Panlobular emphysema: Secondary | ICD-10-CM | POA: Diagnosis not present

## 2023-03-29 DIAGNOSIS — M6281 Muscle weakness (generalized): Secondary | ICD-10-CM | POA: Diagnosis not present

## 2023-03-29 DIAGNOSIS — J9621 Acute and chronic respiratory failure with hypoxia: Secondary | ICD-10-CM | POA: Diagnosis not present

## 2023-03-29 DIAGNOSIS — I2510 Atherosclerotic heart disease of native coronary artery without angina pectoris: Secondary | ICD-10-CM | POA: Diagnosis not present

## 2023-03-29 DIAGNOSIS — Z9981 Dependence on supplemental oxygen: Secondary | ICD-10-CM | POA: Diagnosis not present

## 2023-03-29 DIAGNOSIS — I4811 Longstanding persistent atrial fibrillation: Secondary | ICD-10-CM | POA: Diagnosis not present

## 2023-03-29 DIAGNOSIS — J441 Chronic obstructive pulmonary disease with (acute) exacerbation: Secondary | ICD-10-CM | POA: Diagnosis not present

## 2023-03-29 DIAGNOSIS — R278 Other lack of coordination: Secondary | ICD-10-CM | POA: Diagnosis not present

## 2023-04-01 DIAGNOSIS — I4811 Longstanding persistent atrial fibrillation: Secondary | ICD-10-CM | POA: Diagnosis not present

## 2023-04-01 DIAGNOSIS — J431 Panlobular emphysema: Secondary | ICD-10-CM | POA: Diagnosis not present

## 2023-04-01 DIAGNOSIS — R2689 Other abnormalities of gait and mobility: Secondary | ICD-10-CM | POA: Diagnosis not present

## 2023-04-01 DIAGNOSIS — J9621 Acute and chronic respiratory failure with hypoxia: Secondary | ICD-10-CM | POA: Diagnosis not present

## 2023-04-01 DIAGNOSIS — R278 Other lack of coordination: Secondary | ICD-10-CM | POA: Diagnosis not present

## 2023-04-01 DIAGNOSIS — Z9981 Dependence on supplemental oxygen: Secondary | ICD-10-CM | POA: Diagnosis not present

## 2023-04-01 DIAGNOSIS — M6281 Muscle weakness (generalized): Secondary | ICD-10-CM | POA: Diagnosis not present

## 2023-04-01 DIAGNOSIS — J441 Chronic obstructive pulmonary disease with (acute) exacerbation: Secondary | ICD-10-CM | POA: Diagnosis not present

## 2023-04-01 DIAGNOSIS — Z741 Need for assistance with personal care: Secondary | ICD-10-CM | POA: Diagnosis not present

## 2023-04-01 DIAGNOSIS — I2510 Atherosclerotic heart disease of native coronary artery without angina pectoris: Secondary | ICD-10-CM | POA: Diagnosis not present

## 2023-04-01 DIAGNOSIS — I4891 Unspecified atrial fibrillation: Secondary | ICD-10-CM | POA: Diagnosis not present

## 2023-04-02 ENCOUNTER — Other Ambulatory Visit: Payer: Self-pay | Admitting: Internal Medicine

## 2023-04-02 ENCOUNTER — Other Ambulatory Visit: Payer: Self-pay | Admitting: Medical

## 2023-04-02 DIAGNOSIS — J431 Panlobular emphysema: Secondary | ICD-10-CM

## 2023-04-03 DIAGNOSIS — J431 Panlobular emphysema: Secondary | ICD-10-CM | POA: Diagnosis not present

## 2023-04-03 DIAGNOSIS — I4891 Unspecified atrial fibrillation: Secondary | ICD-10-CM | POA: Diagnosis not present

## 2023-04-03 DIAGNOSIS — J9621 Acute and chronic respiratory failure with hypoxia: Secondary | ICD-10-CM | POA: Diagnosis not present

## 2023-04-03 DIAGNOSIS — Z741 Need for assistance with personal care: Secondary | ICD-10-CM | POA: Diagnosis not present

## 2023-04-03 DIAGNOSIS — I2510 Atherosclerotic heart disease of native coronary artery without angina pectoris: Secondary | ICD-10-CM | POA: Diagnosis not present

## 2023-04-03 DIAGNOSIS — R278 Other lack of coordination: Secondary | ICD-10-CM | POA: Diagnosis not present

## 2023-04-03 DIAGNOSIS — R2689 Other abnormalities of gait and mobility: Secondary | ICD-10-CM | POA: Diagnosis not present

## 2023-04-03 DIAGNOSIS — J441 Chronic obstructive pulmonary disease with (acute) exacerbation: Secondary | ICD-10-CM | POA: Diagnosis not present

## 2023-04-03 DIAGNOSIS — Z9981 Dependence on supplemental oxygen: Secondary | ICD-10-CM | POA: Diagnosis not present

## 2023-04-03 DIAGNOSIS — M6281 Muscle weakness (generalized): Secondary | ICD-10-CM | POA: Diagnosis not present

## 2023-04-03 DIAGNOSIS — I4811 Longstanding persistent atrial fibrillation: Secondary | ICD-10-CM | POA: Diagnosis not present

## 2023-04-03 NOTE — Telephone Encounter (Signed)
Refill request for clonazePAM (KLONOPIN) 0.5 MG tablet   LOV - 01/07/23 Next OV - 07/15/23 Last refill - 02/28/23 #30/0

## 2023-04-05 ENCOUNTER — Other Ambulatory Visit: Payer: Self-pay

## 2023-04-05 ENCOUNTER — Ambulatory Visit: Payer: No Typology Code available for payment source | Attending: Student | Admitting: Student

## 2023-04-05 ENCOUNTER — Encounter: Payer: Self-pay | Admitting: Student

## 2023-04-05 VITALS — BP 106/58 | HR 79 | Ht 70.5 in | Wt 159.8 lb

## 2023-04-05 DIAGNOSIS — D6869 Other thrombophilia: Secondary | ICD-10-CM

## 2023-04-05 DIAGNOSIS — I499 Cardiac arrhythmia, unspecified: Secondary | ICD-10-CM | POA: Diagnosis not present

## 2023-04-05 DIAGNOSIS — I4819 Other persistent atrial fibrillation: Secondary | ICD-10-CM

## 2023-04-05 LAB — BASIC METABOLIC PANEL
BUN/Creatinine Ratio: 21 (ref 10–24)
BUN: 28 mg/dL — ABNORMAL HIGH (ref 8–27)
CO2: 29 mmol/L (ref 20–29)
Calcium: 9.5 mg/dL (ref 8.6–10.2)
Chloride: 101 mmol/L (ref 96–106)
Creatinine, Ser: 1.32 mg/dL — ABNORMAL HIGH (ref 0.76–1.27)
Glucose: 73 mg/dL (ref 70–99)
Potassium: 4.6 mmol/L (ref 3.5–5.2)
Sodium: 143 mmol/L (ref 134–144)
eGFR: 54 mL/min/{1.73_m2} — ABNORMAL LOW (ref >59)

## 2023-04-05 LAB — MAGNESIUM: Magnesium: 2.2 mg/dL (ref 1.6–2.3)

## 2023-04-05 NOTE — Patient Instructions (Signed)
Medication Instructions:  Your physician recommends that you continue on your current medications as directed. Please refer to the Current Medication list given to you today.  *If you need a refill on your cardiac medications before your next appointment, please call your pharmacy*  Lab Work: BMET, MAG-TODAY If you have labs (blood work) drawn today and your tests are completely normal, you will receive your results only by: MyChart Message (if you have MyChart) OR A paper copy in the mail If you have any lab test that is abnormal or we need to change your treatment, we will call you to review the results.  Follow-Up: At [facility], you and your health needs are our priority.  As part of our continuing mission to provide you with exceptional heart care, we have created designated Provider Care Teams.  These Care Teams include your primary Cardiologist (physician) and Advanced Practice Providers (APPs -  Physician Assistants and Nurse Practitioners) who all work together to provide you with the care you need, when you need it.  Your next appointment:   07/04/2023 at 1:00 PM  Provider:   Sherie Don, NP

## 2023-04-05 NOTE — Progress Notes (Signed)
  Electrophysiology Office Note:   Date:  04/05/2023  ID:  [name] Register., DOB 18-Nov-1940, MRN [id number]  Primary Cardiologist: Lorine Bears, MD Electrophysiologist: Lanier Prude, MD      History of Present Illness:   [name] [name]. is a 82 y.o. male with h/o HFpEF, CAD s/p PCI, COPD on Oxygeny, and AFL seen today for routine electrophysiology followup.   Tikosyn admission 8/5-03/07/23 with chemical conversion on med.  Since last being seen in our clinic the patient reports doing about the same. He had an episode of dizziness earlier this week while standing for a prolonged period (after purchasing a new car). Didn't measure HR or O2 at that time. He reports his O2 dropping into the 80s with moderate exertion. One event of it dropping into the "50s" but showed as 80s on the other hand. No syncope. Occasionally will notice a single, irregular beat, especially with PT.   Review of systems complete and found to be negative unless listed in HPI.   EP Information / Studies Reviewed:    EKG is ordered today. Personal review as below.  EKG Interpretation Date/Time:  Friday April 05 2023 10:20:46 EDT Ventricular Rate:  79 PR Interval:  170 QRS Duration:  86 QT Interval:  406 QTC Calculation: 465 R Axis:   87  Text Interpretation: Normal sinus rhythm with sinus arrhythmia Normal ECG  Stable QT Confirmed by [name] [name] [phone]) on 04/05/2023 10:45:43 AM      Physical Exam:   VS:  BP (!) 106/58   Pulse 79   Ht 5' 10.5" (1.791 m)   Wt 159 lb 12.8 oz (72.5 kg)   SpO2 98%   BMI 22.60 kg/m    Wt Readings from Last 3 Encounters:  04/05/23 159 lb 12.8 oz (72.5 kg)  03/14/23 160 lb 6.4 oz (72.8 kg)  03/06/23 158 lb 8.2 oz (71.9 kg)     GEN: Well nourished, well developed in no acute distress NECK: No JVD; No carotid bruits CARDIAC: Regular rate and rhythm, no murmurs, rubs, gallops RESPIRATORY:  Clear to auscultation without rales, wheezing or rhonchi  ABDOMEN:  Soft, non-tender, non-distended EXTREMITIES:  No edema; No deformity   ASSESSMENT AND PLAN:    Persistent atypical atrial flutter EKG today shows NSR with stable intervals.  Continue Tikosyn 250 mcg BID BMET and Mg today Continue diltiazem 120 mg daily Continue eliquis for CHA2DS2/VASc of at least 5.  Secondary hypercoagulable state Pt on Eliquis as above   Chronic hypoxic respiratory failure On O2.  Encouraged sliding scale as ordered with exertion, and pulmonary follow up if desaturations continue.    Follow up with EP APP in 3 months  Signed, [name] Freer, PA-C

## 2023-04-08 ENCOUNTER — Telehealth: Payer: Medicare Other | Admitting: Physician Assistant

## 2023-04-08 ENCOUNTER — Telehealth: Payer: Self-pay

## 2023-04-08 ENCOUNTER — Telehealth (INDEPENDENT_AMBULATORY_CARE_PROVIDER_SITE_OTHER): Payer: Medicare Other | Admitting: Internal Medicine

## 2023-04-08 ENCOUNTER — Encounter: Payer: Self-pay | Admitting: Internal Medicine

## 2023-04-08 VITALS — BP 130/60 | Wt 159.0 lb

## 2023-04-08 DIAGNOSIS — U071 COVID-19: Secondary | ICD-10-CM | POA: Insufficient documentation

## 2023-04-08 MED ORDER — NIRMATRELVIR/RITONAVIR (PAXLOVID) TABLET (RENAL DOSING): 2.0000 | ORAL_TABLET | Freq: Two times a day (BID) | ORAL | 0 refills | Status: AC

## 2023-04-08 NOTE — Telephone Encounter (Signed)
I just did a virtual visit with him and prescribed paxlovid

## 2023-04-08 NOTE — Progress Notes (Signed)
   Thank you for the details you included in the comment boxes. Those details are very helpful in determining the best course of treatment for you and help [place] to provide the best care. Because you are Covid 19 positive, we recommend that you convert this visit to a video visit in order for the provider to better assess what is going on.  The provider will be able to give you a more accurate diagnosis and treatment plan if we can more freely discuss your symptoms and with the addition of a virtual examination.   If you convert to a video visit, we will bill your insurance (similar to an office visit) and you will not be charged for this e-Visit. You will be able to stay at home and speak with the first available [facility] advanced practice provider. The link to do a video visit is in the drop down Menu tab of your Welcome screen in MyChart.  You will need to schedule the video visit. You can do this through one of two ways:  1) Go into your MyChart App and select the "Menu" button, then select the "Virtual Urgent Care Visit" then proceed scheduling -OR- 2) Go to [email or web address] and select "Get Started" under the Virtual Urgent Care option, select "View all options", then select the "Schedule on your Time" and proceed with scheduling.  Best Regards,  Daiva Nakayama, PA-C   I have spent 5 minutes in review of e-visit questionnaire, review and updating patient chart, medical decision making and response to patient.   Margaretann Loveless, PA-C

## 2023-04-08 NOTE — Telephone Encounter (Signed)
Per message from Conard Novak, Mr. Durrett is positive for covid today. I scheduled him with C. Nche tomorrow at 9:20am, and gave him virtual options via my chart. Walked him through it. He still wanted me to ask if Dr. Alphonsus Sias could call him after he finishes his patients today and do a visit with him.   Do you want to add him on at the end of the day?

## 2023-04-08 NOTE — Progress Notes (Signed)
Subjective:    Patient ID: [name] [name]., male    DOB: [birth year], 82 y.o.   MRN: [id number]  HPI Video virtual visit for COVID infection Identification done Reviewed limitations and billing and he gave consent Participants--patient in his home (wife is there and helps a bit with history) and I am in my office  Started with runny nose, tired, runny nose Increased sleep--fatigue At least low grade fever No sig aching Some dizziness Increased SOB with moving around---very hard to take shower Some headache Slight sore throat  Tested positive today  Current Outpatient Medications on File Prior to Visit  Medication Sig Dispense Refill   acetaminophen (TYLENOL) 325 MG tablet Take 650 mg by mouth every 4 (four) hours as needed.     albuterol (PROVENTIL) (2.5 MG/3ML) 0.083% nebulizer solution Take 2.5 mg by nebulization 2 (two) times daily.     albuterol (VENTOLIN HFA) 108 (90 Base) MCG/ACT inhaler Inhale 2 puffs into the lungs 3 (three) times daily as needed for wheezing or shortness of breath.     apixaban (ELIQUIS) 5 MG TABS tablet Take 1 tablet (5 mg total) by mouth 2 (two) times daily. 180 tablet 3   Budeson-Glycopyrrol-Formoterol (BREZTRI AEROSPHERE) 160-9-4.8 MCG/ACT AERO Inhale 2 puffs into the lungs 2 (two) times daily. 10.7 g 6   clonazePAM (KLONOPIN) 0.5 MG tablet Take 1 tablet (0.5 mg total) by mouth at bedtime. 30 tablet 0   diltiazem (CARDIZEM CD) 120 MG 24 hr capsule Take 1 capsule (120 mg total) by mouth 2 (two) times daily. 180 capsule 3   Docusate Calcium (STOOL SOFTENER PO) Take 1 tablet by mouth every evening.     dofetilide (TIKOSYN) 250 MCG capsule Take 1 capsule (250 mcg total) by mouth 2 (two) times daily. 180 capsule 2   finasteride (PROSCAR) 5 MG tablet Take 5 mg by mouth daily.     furosemide (LASIX) 20 MG tablet TAKE 1 TABLET BY MOUTH EVERY DAY WITH ADDITIONAL TABLET EVERY OTHER DAY 135 tablet 0   furosemide (LASIX) 40 MG tablet Take 40 mg by mouth every  other day.     gabapentin (NEURONTIN) 100 MG capsule Take 200 mg by mouth at bedtime.     ipratropium (ATROVENT) 0.03 % nasal spray Place 2 sprays into both nostrils every 12 (twelve) hours. 30 mL 12   ipratropium-albuterol (DUONEB) 0.5-2.5 (3) MG/3ML SOLN INHALE 3 ML BY NEBULIZER EVERY 6 HOURS AS NEEDED 360 mL 1   nitroGLYCERIN (NITROSTAT) 0.4 MG SL tablet Place 1 tablet (0.4 mg total) under the tongue every 5 (five) minutes as needed for chest pain. 25 tablet 4   OXYGEN Inhale into the lungs. 2 liters upon exertion and 3 liters continuous at night.     pantoprazole (PROTONIX) 40 MG tablet Take 1 tablet (40 mg total) by mouth daily. 90 tablet 3   polyethylene glycol (MIRALAX / GLYCOLAX) 17 g packet Take 17 g by mouth daily as needed for moderate constipation.     predniSONE (DELTASONE) 10 MG tablet Take 1 tablet (10 mg total) by mouth daily with breakfast. 90 tablet 3   rosuvastatin (CRESTOR) 20 MG tablet TAKE 1 TABLET BY MOUTH EVERYDAY AT BEDTIME (Patient taking differently: Take 20 mg by mouth every evening.) 90 tablet 0   silver sulfADIAZINE (SILVADENE) 1 % cream Apply 1 Application topically daily. 50 g 0   No current facility-administered medications on file prior to visit.    Allergies  Allergen Reactions   Flagyl [Metronidazole]  Other (See Comments)    Other reaction(s): skin reaction    Past Medical History:  Diagnosis Date   Abdominal discomfort 12/07/2020   Acquired trigger finger of right middle finger 02/04/2020   Acute prostatitis 04/08/2018   Alcohol abuse    Alcohol dependence (HCC) 12/07/2020   Anal or rectal pain 04/08/2018   Annual physical exam 12/07/2020   Anorectal disorder 12/07/2020   Arthritis    Atherosclerotic heart disease of native coronary artery without angina pectoris 04/08/2018   a. 03/2018 PCI: LM nl, LAD 7m, LCX nl, OM1 70p (2.75x8 Synergy DES), 6m (2.5x24 Synergy DES), RCA 25p, RPL1 50. EF 55-65%.   Atrial flutter (HCC)    a. 07/2022 in setting  of hospitalization for resp illness/RSV. CHA2DS2VASc = 4-->eliquis; b. 08/2022 s/p DCCV (100J); c. 09/26/2022 Recurrent Aflutter.   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 02/2022 Carotid U/S: 1-39% bilat ICA stenoses.   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   Contracture of palmar fascia 12/07/2020   COPD (chronic obstructive pulmonary disease) (HCC)    Corn of toe 12/07/2020   Corns and callosities 04/09/2018   Coronary artery disease    2019 with stents   Coronary atherosclerosis 05/07/2018   Degenerative disc disease, 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   Degenerative disc disease, 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   Diarrhea 04/09/2018   Double vision with both eyes open 12/07/2020   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   Dysphagia 08/14/2018   Dyspnea    ED (erectile dysfunction)    ED (erectile dysfunction) of organic origin 12/07/2020   Elevated prostate specific antigen (PSA) 04/08/2018   Elevated PSA    Encounter for screening for other disorder 12/07/2020   Enlarged prostate without lower urinary tract symptoms (luts) 04/09/2018   Enthesopathy 12/07/2020   Essential (primary) hypertension 04/08/2018   Ex-smoker 04/09/2018   Exertional dyspnea 04/02/2018   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 07/2022 Echo: EF 65-70%, no  rwma, mild LVH, nl RV fxn.   Hyperlipidemia    Hypertension    Hypoxemia 12/07/2020   Kidney cysts    Left knee pain 12/07/2020   Low back pain 04/08/2018   Male erectile dysfunction 04/09/2018   Mixed hyperlipidemia 04/08/2018   Nocturia more than twice per night 04/09/2018   Osteoarthritis of first carpometacarpal joint 04/08/2018   Osteoarthritis of knee 12/07/2020   Other long term (current) drug therapy 12/07/2020   Pain due to onychomycosis of toenail of left foot 07/20/2019   Pain in joint 04/09/2018   Pain in knee 04/09/2018   Pain in unspecified knee 12/07/2020   Pain of finger 04/09/2018   Palpitations    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HCC) 12/07/2020   Personal history of colonic polyps 12/07/2020   Pneumonia    Pneumothorax 04/12/2020   Polypharmacy 04/09/2018   Porokeratosis 01/13/2019   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HCC)  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   Pulmonary nodules 06/08/2016   Pure hypercholesterolemia 12/07/2020   Renal cyst 12/07/2020   Sleep disorder 04/08/2018   Status post bronchoscopy 04/12/2020   Tear of rotator cuff 04/09/2018   Tobacco user 12/07/2020   Uncomplicated alcohol dependence (HCC) 04/09/2018   Unspecified rotator cuff tear or rupture of unspecified shoulder, not specified as traumatic 12/07/2020   Unspecified tear of unspecified meniscus, current injury, unspecified knee, initial encounter 12/07/2020   Visual disturbance 12/07/2020   Vitamin D deficiency 04/08/2018    Past Surgical History:  Procedure Laterality Date   BRONCHIAL BIOPSY  10/13/2019   Procedure: BRONCHIAL BIOPSIES;  Surgeon: Leslye Peer, MD;  Location: [facility] ENDOSCOPY;  Service: Pulmonary;;   BRONCHIAL BIOPSY  04/12/2020   Procedure: BRONCHIAL BIOPSIES;  Surgeon: Leslye Peer, MD;  Location: [facility] ENDOSCOPY;  Service: Pulmonary;;   BRONCHIAL BRUSHINGS  10/13/2019   Procedure: BRONCHIAL BRUSHINGS;  Surgeon: Leslye Peer, MD;  Location: [facility] ENDOSCOPY;  Service:  Pulmonary;;   BRONCHIAL BRUSHINGS  04/12/2020   Procedure: BRONCHIAL BRUSHINGS;  Surgeon: Leslye Peer, MD;  Location: [facility] ENDOSCOPY;  Service: Pulmonary;;   BRONCHIAL NEEDLE ASPIRATION BIOPSY  10/13/2019   Procedure: BRONCHIAL NEEDLE ASPIRATION BIOPSIES;  Surgeon: Leslye Peer, MD;  Location: [facility] ENDOSCOPY;  Service: Pulmonary;;   BRONCHIAL NEEDLE ASPIRATION BIOPSY  04/12/2020   Procedure: BRONCHIAL NEEDLE ASPIRATION BIOPSIES;  Surgeon: Leslye Peer, MD;  Location: [facility] ENDOSCOPY;  Service: Pulmonary;;   BRONCHIAL WASHINGS  10/13/2019   Procedure: BRONCHIAL WASHINGS;  Surgeon: Leslye Peer, MD;  Location: [facility] ENDOSCOPY;  Service: Pulmonary;;   BRONCHIAL WASHINGS  04/12/2020   Procedure: BRONCHIAL WASHINGS;  Surgeon: Leslye Peer, MD;  Location: [facility] ENDOSCOPY;  Service: Pulmonary;;   CARDIAC CATHETERIZATION  2002   50% RCA   CARDIOVERSION N/A 09/17/2022   Procedure: CARDIOVERSION;  Surgeon: Iran Ouch, MD;  Location: [facility] ORS;  Service: Cardiovascular;  Laterality: N/A;   CATARACT EXTRACTION, BILATERAL     CORONARY STENT INTERVENTION N/A 04/15/2018   Procedure: CORONARY STENT INTERVENTION;  Surgeon: Corky Crafts, MD;  Location: [facility] INVASIVE CV LAB;  Service: Cardiovascular;  Laterality: N/A;  om1   EYE SURGERY     KNEE ARTHROSCOPY     LEFT HEART CATH AND CORONARY ANGIOGRAPHY N/A 04/15/2018   Procedure: LEFT HEART CATH AND CORONARY ANGIOGRAPHY;  Surgeon: Corky Crafts, MD;  Location: [facility] INVASIVE CV LAB;  Service: Cardiovascular;  Laterality: N/A;   MULTIPLE TOOTH EXTRACTIONS     TONSILLECTOMY     VIDEO BRONCHOSCOPY  04/12/2020   VIDEO BRONCHOSCOPY WITH ENDOBRONCHIAL NAVIGATION N/A 07/26/2016   Procedure: VIDEO BRONCHOSCOPY WITH ENDOBRONCHIAL NAVIGATION;  Surgeon: Leslye Peer, MD;  Location: [facility] OR;  Service: Thoracic;  Laterality: N/A;   VIDEO BRONCHOSCOPY WITH ENDOBRONCHIAL NAVIGATION N/A 10/13/2019   Procedure: [facility] AND VIDEO BRONCHOSCOPY WITH ENDOBRONCHIAL  NAVIGATION;  Surgeon: Leslye Peer, MD;  Location: [facility] ENDOSCOPY;  Service: Pulmonary;  Laterality: N/A;   VIDEO BRONCHOSCOPY WITH ENDOBRONCHIAL NAVIGATION N/A 04/12/2020   Procedure: VIDEO BRONCHOSCOPY WITH ENDOBRONCHIAL NAVIGATION;  Surgeon: Leslye Peer, MD;  Location: [facility] ENDOSCOPY;  Service: Pulmonary;  Laterality: N/A;    Family History  Problem Relation Age of Onset   Hypertension Mother    Alzheimer's disease Mother     Social History   Socioeconomic History   Marital status: Married    Spouse name: Not on file   Number of children: 2   Years of education:  Not on file   Highest education level: Master's degree (e.g., MA, MS, MEng, MEd, MSW, MBA)  Occupational History   Occupation: Special educational needs teacher, insurance, 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  Tobacco Use   Smoking status: Former    Current packs/day: 0.00    Average packs/day: 0.8 packs/day for 68.0 years (51.0 ttl pk-yrs)    Types: Cigarettes    Start date: 83    Quit date: 07/13/2020    Years since quitting: 2.7    Passive exposure: Never   Smokeless tobacco: Never   Tobacco comments:    Recent Quit    Vaping Use   Vaping status: Former  Substance and Sexual Activity   Alcohol use: No    Alcohol/week: 0.0 standard drinks of alcohol    Comment: quit drinking 2014   Drug use: No   Sexual activity: Not on file  Other Topics Concern   Not on file  Social History Narrative   Has living will   Wife is health care POA--alternate is son Jeanice Lim)   Has daughter in [place]   Has DNR   No tube feeds if cognitively unaware   Social Determinants of Health   Financial Resource Strain: Low Risk  (01/06/2023)   Overall Financial Resource Strain (CARDIA)    Difficulty of Paying Living Expenses: Not hard at all  Food Insecurity: No Food Insecurity (03/18/2023)   Hunger Vital Sign    Worried About Running Out of Food in the Last Year: Never true    Ran Out of Food in the Last Year: Never true  Transportation  Needs: No Transportation Needs (03/18/2023)   PRAPARE - Administrator, Civil Service (Medical): No    Lack of Transportation (Non-Medical): No  Physical Activity: Sufficiently Active (01/06/2023)   Exercise Vital Sign    Days of Exercise per Week: 4 days    Minutes of Exercise per Session: 40 min  Stress: No Stress Concern Present (01/06/2023)   [employer] of Occupational Health - Occupational Stress Questionnaire    Feeling of Stress : Not at all  Social Connections: Unknown (01/06/2023)   Social Connection and Isolation Panel [NHANES]    Frequency of Communication with Friends and Family: Never    Frequency of Social Gatherings with Friends and Family: Once a week    Attends Religious Services: More than 4 times per year    Active Member of [employer] or Organizations: Not on file    Attends Banker Meetings: Not on file    Marital Status: Married  Intimate Partner Violence: Not At Risk (03/04/2023)   Humiliation, Afraid, Rape, and Kick questionnaire    Fear of Current or Ex-Partner: No    Emotionally Abused: No    Physically Abused: No    Sexually Abused: No   Review of Systems No loss of smell or taste Appetite is off Drinking okay    Objective:   Physical Exam Constitutional:      Appearance: Normal appearance.  Pulmonary:     Effort: Pulmonary effort is normal. No respiratory distress.  Neurological:     Mental Status: He is alert.            Assessment & Plan:

## 2023-04-08 NOTE — Assessment & Plan Note (Signed)
Mild infection but high risk Will treat with paxlovid--discussed med interactions. Lower dose for CKD Analgesics, rest Isolate for now Mask when out--after symptoms resolve To ER if worsens

## 2023-04-09 ENCOUNTER — Telehealth: Payer: Medicare Other | Admitting: Nurse Practitioner

## 2023-04-14 ENCOUNTER — Encounter: Payer: Self-pay | Admitting: Internal Medicine

## 2023-04-16 ENCOUNTER — Ambulatory Visit: Payer: Medicare Other

## 2023-04-16 ENCOUNTER — Encounter: Payer: Self-pay | Admitting: Internal Medicine

## 2023-04-16 ENCOUNTER — Ambulatory Visit (INDEPENDENT_AMBULATORY_CARE_PROVIDER_SITE_OTHER): Payer: Medicare Other | Admitting: Internal Medicine

## 2023-04-16 ENCOUNTER — Other Ambulatory Visit: Payer: Self-pay | Admitting: Nurse Practitioner

## 2023-04-16 VITALS — BP 122/64 | HR 90 | Temp 97.3°F | Ht 70.5 in | Wt 159.0 lb

## 2023-04-16 DIAGNOSIS — J441 Chronic obstructive pulmonary disease with (acute) exacerbation: Secondary | ICD-10-CM | POA: Diagnosis not present

## 2023-04-16 MED ORDER — METHYLPREDNISOLONE ACETATE 80 MG/ML IJ SUSP
80.0000 mg | Freq: Once | INTRAMUSCULAR | Status: AC
Start: 2023-04-16 — End: 2023-04-16
  Administered 2023-04-16: 80 mg via INTRAMUSCULAR

## 2023-04-16 NOTE — Addendum Note (Signed)
Addended by: Eual Fines on: 04/16/2023 12:56 PM   Modules accepted: Orders

## 2023-04-16 NOTE — Assessment & Plan Note (Signed)
Mild exacerbation from recent COVID Chronic prednisone and other Rx--but will give depomedrol  80mg  IM

## 2023-04-16 NOTE — Progress Notes (Signed)
Subjective:    Patient ID: [name] Register., male    DOB: [birth year], 82 y.o.   MRN: [id number]  HPI Here with wife due to ongoing shortness of breath after recent COVID infection  Feels fairly good--but having some breathing issues still COVID infection not quite 2 weeks ago Has still tested positive ---discussed not checking anymore (though negative this morning)  No fever Coughs with some mucus--same as his usual  Current Outpatient Medications on File Prior to Visit  Medication Sig Dispense Refill   acetaminophen (TYLENOL) 325 MG tablet Take 650 mg by mouth every 4 (four) hours as needed.     albuterol (PROVENTIL) (2.5 MG/3ML) 0.083% nebulizer solution Take 2.5 mg by nebulization 2 (two) times daily.     albuterol (VENTOLIN HFA) 108 (90 Base) MCG/ACT inhaler Inhale 2 puffs into the lungs 3 (three) times daily as needed for wheezing or shortness of breath.     apixaban (ELIQUIS) 5 MG TABS tablet Take 1 tablet (5 mg total) by mouth 2 (two) times daily. 180 tablet 3   Budeson-Glycopyrrol-Formoterol (BREZTRI AEROSPHERE) 160-9-4.8 MCG/ACT AERO Inhale 2 puffs into the lungs 2 (two) times daily. 10.7 g 6   clonazePAM (KLONOPIN) 0.5 MG tablet Take 1 tablet (0.5 mg total) by mouth at bedtime. 30 tablet 0   diltiazem (CARDIZEM CD) 120 MG 24 hr capsule Take 1 capsule (120 mg total) by mouth 2 (two) times daily. 180 capsule 3   Docusate Calcium (STOOL SOFTENER PO) Take 1 tablet by mouth every evening.     dofetilide (TIKOSYN) 250 MCG capsule Take 1 capsule (250 mcg total) by mouth 2 (two) times daily. 180 capsule 2   finasteride (PROSCAR) 5 MG tablet Take 5 mg by mouth daily.     furosemide (LASIX) 20 MG tablet TAKE 1 TABLET BY MOUTH EVERY DAY WITH ADDITIONAL TABLET EVERY OTHER DAY 135 tablet 0   furosemide (LASIX) 40 MG tablet Take 40 mg by mouth every other day.     gabapentin (NEURONTIN) 100 MG capsule Take 200 mg by mouth at bedtime.     ipratropium (ATROVENT) 0.03 % nasal spray Place  2 sprays into both nostrils every 12 (twelve) hours. 30 mL 12   ipratropium-albuterol (DUONEB) 0.5-2.5 (3) MG/3ML SOLN INHALE 3 ML BY NEBULIZER EVERY 6 HOURS AS NEEDED 360 mL 1   nitroGLYCERIN (NITROSTAT) 0.4 MG SL tablet Place 1 tablet (0.4 mg total) under the tongue every 5 (five) minutes as needed for chest pain. 25 tablet 4   OXYGEN Inhale into the lungs. 2 liters upon exertion and 3 liters continuous at night.     pantoprazole (PROTONIX) 40 MG tablet Take 1 tablet (40 mg total) by mouth daily. 90 tablet 3   polyethylene glycol (MIRALAX / GLYCOLAX) 17 g packet Take 17 g by mouth daily as needed for moderate constipation.     predniSONE (DELTASONE) 10 MG tablet Take 1 tablet (10 mg total) by mouth daily with breakfast. 90 tablet 3   rosuvastatin (CRESTOR) 20 MG tablet TAKE 1 TABLET BY MOUTH EVERYDAY AT BEDTIME (Patient taking differently: Take 20 mg by mouth every evening.) 90 tablet 0   silver sulfADIAZINE (SILVADENE) 1 % cream Apply 1 Application topically daily. 50 g 0   No current facility-administered medications on file prior to visit.    Allergies  Allergen Reactions   Flagyl [Metronidazole] Other (See Comments)    Other reaction(s): skin reaction    Past Medical History:  Diagnosis Date   Abdominal  discomfort 12/07/2020   Acquired trigger finger of right middle finger 02/04/2020   Acute prostatitis 04/08/2018   Alcohol abuse    Alcohol dependence (HCC) 12/07/2020   Anal or rectal pain 04/08/2018   Annual physical exam 12/07/2020   Anorectal disorder 12/07/2020   Arthritis    Atherosclerotic heart disease of native coronary artery without angina pectoris 04/08/2018   a. 03/2018 PCI: LM nl, LAD 64m, LCX nl, OM1 70p (2.75x8 Synergy DES), 31m (2.5x24 Synergy DES), RCA 25p, RPL1 50. EF 55-65%.   Atrial flutter (HCC)    a. 07/2022 in setting of hospitalization for resp illness/RSV. CHA2DS2VASc = 4-->eliquis; b. 08/2022 s/p DCCV (100J); c. 09/26/2022 Recurrent Aflutter.   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 02/2022 Carotid U/S: 1-39% bilat ICA stenoses.   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   Contracture of palmar fascia 12/07/2020   COPD (chronic obstructive pulmonary disease) (HCC)    Corn of toe 12/07/2020   Corns and callosities 04/09/2018   Coronary artery disease    2019 with stents   Coronary atherosclerosis 05/07/2018   Degenerative disc disease, 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   Degenerative disc disease, 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   Diarrhea 04/09/2018   Double vision with both eyes open 12/07/2020   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   Dysphagia 08/14/2018   Dyspnea    ED (erectile dysfunction)    ED (erectile dysfunction) of organic origin 12/07/2020   Elevated prostate specific antigen (PSA) 04/08/2018   Elevated PSA    Encounter for screening for other disorder 12/07/2020   Enlarged prostate without lower urinary tract symptoms (luts) 04/09/2018   Enthesopathy 12/07/2020   Essential (primary) hypertension 04/08/2018   Ex-smoker 04/09/2018   Exertional dyspnea 04/02/2018   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 07/2022 Echo: EF 65-70%, no rwma, mild LVH, nl RV fxn.   Hyperlipidemia    Hypertension    Hypoxemia 12/07/2020   Kidney cysts    Left knee pain 12/07/2020   Low  back pain 04/08/2018   Male erectile dysfunction 04/09/2018   Mixed hyperlipidemia 04/08/2018   Nocturia more than twice per night 04/09/2018   Osteoarthritis of first carpometacarpal joint 04/08/2018   Osteoarthritis of knee 12/07/2020   Other long term (current) drug therapy 12/07/2020   Pain due to onychomycosis of toenail of left foot 07/20/2019   Pain in joint 04/09/2018   Pain in knee 04/09/2018   Pain in unspecified knee 12/07/2020   Pain of finger 04/09/2018   Palpitations    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HCC) 12/07/2020   Personal history of colonic polyps 12/07/2020   Pneumonia    Pneumothorax 04/12/2020   Polypharmacy 04/09/2018   Porokeratosis 01/13/2019   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HCC)    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   Pulmonary nodules 06/08/2016   Pure hypercholesterolemia 12/07/2020   Renal cyst 12/07/2020   Sleep  disorder 04/08/2018   Status post bronchoscopy 04/12/2020   Tear of rotator cuff 04/09/2018   Tobacco user 12/07/2020   Uncomplicated alcohol dependence (HCC) 04/09/2018   Unspecified rotator cuff tear or rupture of unspecified shoulder, not specified as traumatic 12/07/2020   Unspecified tear of unspecified meniscus, current injury, unspecified knee, initial encounter 12/07/2020   Visual disturbance 12/07/2020   Vitamin D deficiency 04/08/2018    Past Surgical History:  Procedure Laterality Date   BRONCHIAL BIOPSY  10/13/2019   Procedure: BRONCHIAL BIOPSIES;  Surgeon: Leslye Peer, MD;  Location: [facility] ENDOSCOPY;  Service: Pulmonary;;   BRONCHIAL BIOPSY  04/12/2020   Procedure: BRONCHIAL BIOPSIES;  Surgeon: Leslye Peer, MD;  Location: [facility] ENDOSCOPY;  Service: Pulmonary;;   BRONCHIAL BRUSHINGS  10/13/2019   Procedure: BRONCHIAL BRUSHINGS;  Surgeon: Leslye Peer, MD;  Location: [facility] ENDOSCOPY;  Service: Pulmonary;;   BRONCHIAL BRUSHINGS  04/12/2020   Procedure: BRONCHIAL BRUSHINGS;  Surgeon: Leslye Peer, MD;  Location: [facility] ENDOSCOPY;  Service:  Pulmonary;;   BRONCHIAL NEEDLE ASPIRATION BIOPSY  10/13/2019   Procedure: BRONCHIAL NEEDLE ASPIRATION BIOPSIES;  Surgeon: Leslye Peer, MD;  Location: [facility] ENDOSCOPY;  Service: Pulmonary;;   BRONCHIAL NEEDLE ASPIRATION BIOPSY  04/12/2020   Procedure: BRONCHIAL NEEDLE ASPIRATION BIOPSIES;  Surgeon: Leslye Peer, MD;  Location: [facility] ENDOSCOPY;  Service: Pulmonary;;   BRONCHIAL WASHINGS  10/13/2019   Procedure: BRONCHIAL WASHINGS;  Surgeon: Leslye Peer, MD;  Location: [facility] ENDOSCOPY;  Service: Pulmonary;;   BRONCHIAL WASHINGS  04/12/2020   Procedure: BRONCHIAL WASHINGS;  Surgeon: Leslye Peer, MD;  Location: [facility] ENDOSCOPY;  Service: Pulmonary;;   CARDIAC CATHETERIZATION  2002   50% RCA   CARDIOVERSION N/A 09/17/2022   Procedure: CARDIOVERSION;  Surgeon: Iran Ouch, MD;  Location: [facility] ORS;  Service: Cardiovascular;  Laterality: N/A;   CATARACT EXTRACTION, BILATERAL     CORONARY STENT INTERVENTION N/A 04/15/2018   Procedure: CORONARY STENT INTERVENTION;  Surgeon: Corky Crafts, MD;  Location: [facility] INVASIVE CV LAB;  Service: Cardiovascular;  Laterality: N/A;  om1   EYE SURGERY     KNEE ARTHROSCOPY     LEFT HEART CATH AND CORONARY ANGIOGRAPHY N/A 04/15/2018   Procedure: LEFT HEART CATH AND CORONARY ANGIOGRAPHY;  Surgeon: Corky Crafts, MD;  Location: [facility] INVASIVE CV LAB;  Service: Cardiovascular;  Laterality: N/A;   MULTIPLE TOOTH EXTRACTIONS     TONSILLECTOMY     VIDEO BRONCHOSCOPY  04/12/2020   VIDEO BRONCHOSCOPY WITH ENDOBRONCHIAL NAVIGATION N/A 07/26/2016   Procedure: VIDEO BRONCHOSCOPY WITH ENDOBRONCHIAL NAVIGATION;  Surgeon: Leslye Peer, MD;  Location: [facility] OR;  Service: Thoracic;  Laterality: N/A;   VIDEO BRONCHOSCOPY WITH ENDOBRONCHIAL NAVIGATION N/A 10/13/2019   Procedure: [facility] AND VIDEO BRONCHOSCOPY WITH ENDOBRONCHIAL NAVIGATION;  Surgeon: Leslye Peer, MD;  Location: [facility] ENDOSCOPY;  Service: Pulmonary;  Laterality: N/A;   VIDEO BRONCHOSCOPY WITH ENDOBRONCHIAL  NAVIGATION N/A 04/12/2020   Procedure: VIDEO BRONCHOSCOPY WITH ENDOBRONCHIAL NAVIGATION;  Surgeon: Leslye Peer, MD;  Location: [facility] ENDOSCOPY;  Service: Pulmonary;  Laterality: N/A;    Family History  Problem Relation Age of Onset   Hypertension Mother    Alzheimer's disease Mother     Social History   Socioeconomic History   Marital status: Married    Spouse name: Not on file   Number of children: 2   Years of education: Not on file   Highest education level: Master's degree (e.g., MA, MS, MEng, MEd, MSW, MBA)  Occupational History  Occupation: Special educational needs teacher, Community education officer, 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  Tobacco Use   Smoking status: Former    Current packs/day: 0.00    Average packs/day: 0.8 packs/day for 68.0 years (51.0 ttl pk-yrs)    Types: Cigarettes    Start date: 22    Quit date: 07/13/2020    Years since quitting: 2.7    Passive exposure: Never   Smokeless tobacco: Never   Tobacco comments:    Recent Quit    Vaping Use   Vaping status: Former  Substance and Sexual Activity   Alcohol use: No    Alcohol/week: 0.0 standard drinks of alcohol    Comment: quit drinking 2014   Drug use: No   Sexual activity: Not on file  Other Topics Concern   Not on file  Social History Narrative   Has living will   Wife is health care POA--alternate is son Jeanice Lim)   Has daughter in [place]   Has DNR   No tube feeds if cognitively unaware   Social Determinants of Health   Financial Resource Strain: Low Risk  (01/06/2023)   Overall Financial Resource Strain (CARDIA)    Difficulty of Paying Living Expenses: Not hard at all  Food Insecurity: No Food Insecurity (03/18/2023)   Hunger Vital Sign    Worried About Running Out of Food in the Last Year: Never true    Ran Out of Food in the Last Year: Never true  Transportation Needs: No Transportation Needs (03/18/2023)   PRAPARE - Administrator, Civil Service (Medical): No    Lack of Transportation  (Non-Medical): No  Physical Activity: Sufficiently Active (01/06/2023)   Exercise Vital Sign    Days of Exercise per Week: 4 days    Minutes of Exercise per Session: 40 min  Stress: No Stress Concern Present (01/06/2023)   [employer] of Occupational Health - Occupational Stress Questionnaire    Feeling of Stress : Not at all  Social Connections: Unknown (01/06/2023)   Social Connection and Isolation Panel [NHANES]    Frequency of Communication with Friends and Family: Never    Frequency of Social Gatherings with Friends and Family: Once a week    Attends Religious Services: More than 4 times per year    Active Member of [employer] or Organizations: Not on file    Attends Banker Meetings: Not on file    Marital Status: Married  Intimate Partner Violence: Not At Risk (03/04/2023)   Humiliation, Afraid, Rape, and Kick questionnaire    Fear of Current or Ex-Partner: No    Emotionally Abused: No    Physically Abused: No    Sexually Abused: No   Review of Systems Eating okay No N/V    Objective:   Physical Exam Constitutional:      Appearance: Normal appearance.  Cardiovascular:     Rate and Rhythm: Normal rate.     Heart sounds:     No gallop.     Comments: Slightly irregular Pulmonary:     Effort: Pulmonary effort is normal.     Comments: Comfortable Sig decreased breath sounds --his normal Very slight exp prolongation and scant wheeze Neurological:     Mental Status: He is alert.            Assessment & Plan:

## 2023-04-17 ENCOUNTER — Ambulatory Visit: Payer: Medicare Other | Admitting: Cardiology

## 2023-04-19 ENCOUNTER — Ambulatory Visit: Payer: Self-pay

## 2023-04-19 DIAGNOSIS — I4811 Longstanding persistent atrial fibrillation: Secondary | ICD-10-CM | POA: Diagnosis not present

## 2023-04-19 DIAGNOSIS — I4891 Unspecified atrial fibrillation: Secondary | ICD-10-CM | POA: Diagnosis not present

## 2023-04-19 DIAGNOSIS — M6281 Muscle weakness (generalized): Secondary | ICD-10-CM | POA: Diagnosis not present

## 2023-04-19 DIAGNOSIS — R2689 Other abnormalities of gait and mobility: Secondary | ICD-10-CM | POA: Diagnosis not present

## 2023-04-19 DIAGNOSIS — J441 Chronic obstructive pulmonary disease with (acute) exacerbation: Secondary | ICD-10-CM | POA: Diagnosis not present

## 2023-04-19 DIAGNOSIS — R278 Other lack of coordination: Secondary | ICD-10-CM | POA: Diagnosis not present

## 2023-04-19 DIAGNOSIS — Z741 Need for assistance with personal care: Secondary | ICD-10-CM | POA: Diagnosis not present

## 2023-04-19 DIAGNOSIS — J431 Panlobular emphysema: Secondary | ICD-10-CM | POA: Diagnosis not present

## 2023-04-19 DIAGNOSIS — Z9981 Dependence on supplemental oxygen: Secondary | ICD-10-CM | POA: Diagnosis not present

## 2023-04-19 DIAGNOSIS — J9621 Acute and chronic respiratory failure with hypoxia: Secondary | ICD-10-CM | POA: Diagnosis not present

## 2023-04-19 DIAGNOSIS — I2510 Atherosclerotic heart disease of native coronary artery without angina pectoris: Secondary | ICD-10-CM | POA: Diagnosis not present

## 2023-04-19 NOTE — Patient Outreach (Signed)
Care Coordination   Follow Up Visit Note   04/19/2023 Name: [name] [name]. MRN: [id number] DOB: [birth year]  [name] [name]. is a 82 y.o. year old male who sees [name], MD for primary care. I spoke with  [name] [name]. by phone today.  What matters to the patients health and wellness today?  Patient states he continues to having SOB with minor activity along with cough and phlegm.  He reports having follow up visit with provider on 04/16/23 and states he received a steroid injection to help with breathing symptoms.  Patient states has used his nebulizer more this week at least 2 x per day but has not needed to use emergency inhaler.   Patient denies any fatigue or break through Atrial fibrillation symptoms.  Patient states he continues with physical therapy 3 x a week at his living facility [facility].    Goals Addressed             This Visit's Progress    Management and education of chronic health conditions       Interventions Today    Flowsheet Row Most Recent Value  Chronic Disease   Chronic disease during today's visit Chronic Obstructive Pulmonary Disease (COPD), Atrial Fibrillation (AFib), Other  [COVID]  General Interventions   General Interventions Discussed/Reviewed General Interventions Reviewed, Doctor Visits  [evaluation of current treatment plan for mentioned health conditions and patients adherence to plan as established by provider.  Assessed for COPD, Atrial fibrillation, COVID symptoms.]  Doctor Visits Discussed/Reviewed Doctor Visits Reviewed  Education Interventions   Education Provided Provided Education  [Reviewed COPD, atrial fibrillation symptoms with patient. Advised to notify provider for increase in symptoms that are mild/ moderate and call 911 for severe symptoms.]  Pharmacy Interventions   Pharmacy Dicussed/Reviewed Pharmacy Topics Reviewed  [medications reviewed with patient and ongoing compliance of medications advised. Assessed  for nebulizer/ emergency inhaler usage.]              SDOH assessments and interventions completed:  No     Care Coordination Interventions:  Yes, provided   Follow up plan: Follow up call scheduled for 05/08/23    Encounter Outcome:  Patient Visit Completed   George Ina RN,BSN,CCM [facility] Care Coordination [phone] direct line

## 2023-04-19 NOTE — Patient Instructions (Signed)
Visit Information  Thank you for taking time to visit with me today. Please don't hesitate to contact me if I can be of assistance to you.   Following are the goals we discussed today:   Goals Addressed             This Visit's Progress    Management and education of chronic health conditions       Interventions Today    Flowsheet Row Most Recent Value  Chronic Disease   Chronic disease during today's visit Chronic Obstructive Pulmonary Disease (COPD), Atrial Fibrillation (AFib), Other  [COVID]  General Interventions   General Interventions Discussed/Reviewed General Interventions Reviewed, Doctor Visits  [evaluation of current treatment plan for mentioned health conditions and patients adherence to plan as established by provider.  Assessed for COPD, Atrial fibrillation, COVID symptoms.]  Doctor Visits Discussed/Reviewed Doctor Visits Reviewed  Education Interventions   Education Provided Provided Education  [Reviewed COPD, atrial fibrillation symptoms with patient. Advised to notify provider for increase in symptoms that are mild/ moderate and call 911 for severe symptoms.]  Pharmacy Interventions   Pharmacy Dicussed/Reviewed Pharmacy Topics Reviewed  [medications reviewed with patient and ongoing compliance of medications advised. Assessed for nebulizer/ emergency inhaler usage.]              Our next appointment is by telephone on 05/08/23 at 10 am  Please call the care guide team at [phone] if you need to cancel or reschedule your appointment.   If you are experiencing a Mental Health or Behavioral Health Crisis or need someone to talk to, please call the Suicide and Crisis Lifeline: 988 call 1-800-273-TALK (toll free, 24 hour hotline)  Patient verbalizes understanding of instructions and care plan provided today and agrees to view in MyChart. Active MyChart status and patient understanding of how to access instructions and care plan via MyChart confirmed with  patient.     George Ina RN,BSN,CCM [facility] Care Coordination [phone] direct line

## 2023-04-21 ENCOUNTER — Encounter: Payer: Self-pay | Admitting: Internal Medicine

## 2023-04-22 DIAGNOSIS — I2510 Atherosclerotic heart disease of native coronary artery without angina pectoris: Secondary | ICD-10-CM | POA: Diagnosis not present

## 2023-04-22 DIAGNOSIS — I4891 Unspecified atrial fibrillation: Secondary | ICD-10-CM | POA: Diagnosis not present

## 2023-04-22 DIAGNOSIS — M6281 Muscle weakness (generalized): Secondary | ICD-10-CM | POA: Diagnosis not present

## 2023-04-22 DIAGNOSIS — R278 Other lack of coordination: Secondary | ICD-10-CM | POA: Diagnosis not present

## 2023-04-22 DIAGNOSIS — J9621 Acute and chronic respiratory failure with hypoxia: Secondary | ICD-10-CM | POA: Diagnosis not present

## 2023-04-22 DIAGNOSIS — R2689 Other abnormalities of gait and mobility: Secondary | ICD-10-CM | POA: Diagnosis not present

## 2023-04-22 DIAGNOSIS — Z741 Need for assistance with personal care: Secondary | ICD-10-CM | POA: Diagnosis not present

## 2023-04-22 DIAGNOSIS — Z9981 Dependence on supplemental oxygen: Secondary | ICD-10-CM | POA: Diagnosis not present

## 2023-04-22 DIAGNOSIS — J441 Chronic obstructive pulmonary disease with (acute) exacerbation: Secondary | ICD-10-CM | POA: Diagnosis not present

## 2023-04-22 DIAGNOSIS — I4811 Longstanding persistent atrial fibrillation: Secondary | ICD-10-CM | POA: Diagnosis not present

## 2023-04-22 DIAGNOSIS — J431 Panlobular emphysema: Secondary | ICD-10-CM | POA: Diagnosis not present

## 2023-04-24 DIAGNOSIS — R278 Other lack of coordination: Secondary | ICD-10-CM | POA: Diagnosis not present

## 2023-04-24 DIAGNOSIS — Z9981 Dependence on supplemental oxygen: Secondary | ICD-10-CM | POA: Diagnosis not present

## 2023-04-24 DIAGNOSIS — I2510 Atherosclerotic heart disease of native coronary artery without angina pectoris: Secondary | ICD-10-CM | POA: Diagnosis not present

## 2023-04-24 DIAGNOSIS — I4891 Unspecified atrial fibrillation: Secondary | ICD-10-CM | POA: Diagnosis not present

## 2023-04-24 DIAGNOSIS — J431 Panlobular emphysema: Secondary | ICD-10-CM | POA: Diagnosis not present

## 2023-04-24 DIAGNOSIS — M6281 Muscle weakness (generalized): Secondary | ICD-10-CM | POA: Diagnosis not present

## 2023-04-24 DIAGNOSIS — J9621 Acute and chronic respiratory failure with hypoxia: Secondary | ICD-10-CM | POA: Diagnosis not present

## 2023-04-24 DIAGNOSIS — J441 Chronic obstructive pulmonary disease with (acute) exacerbation: Secondary | ICD-10-CM | POA: Diagnosis not present

## 2023-04-24 DIAGNOSIS — I4811 Longstanding persistent atrial fibrillation: Secondary | ICD-10-CM | POA: Diagnosis not present

## 2023-04-24 DIAGNOSIS — Z741 Need for assistance with personal care: Secondary | ICD-10-CM | POA: Diagnosis not present

## 2023-04-24 DIAGNOSIS — R2689 Other abnormalities of gait and mobility: Secondary | ICD-10-CM | POA: Diagnosis not present

## 2023-04-26 DIAGNOSIS — I4891 Unspecified atrial fibrillation: Secondary | ICD-10-CM | POA: Diagnosis not present

## 2023-04-26 DIAGNOSIS — I2510 Atherosclerotic heart disease of native coronary artery without angina pectoris: Secondary | ICD-10-CM | POA: Diagnosis not present

## 2023-04-26 DIAGNOSIS — J431 Panlobular emphysema: Secondary | ICD-10-CM | POA: Diagnosis not present

## 2023-04-26 DIAGNOSIS — R278 Other lack of coordination: Secondary | ICD-10-CM | POA: Diagnosis not present

## 2023-04-26 DIAGNOSIS — R2689 Other abnormalities of gait and mobility: Secondary | ICD-10-CM | POA: Diagnosis not present

## 2023-04-26 DIAGNOSIS — J441 Chronic obstructive pulmonary disease with (acute) exacerbation: Secondary | ICD-10-CM | POA: Diagnosis not present

## 2023-04-26 DIAGNOSIS — Z9981 Dependence on supplemental oxygen: Secondary | ICD-10-CM | POA: Diagnosis not present

## 2023-04-26 DIAGNOSIS — J9621 Acute and chronic respiratory failure with hypoxia: Secondary | ICD-10-CM | POA: Diagnosis not present

## 2023-04-26 DIAGNOSIS — I4811 Longstanding persistent atrial fibrillation: Secondary | ICD-10-CM | POA: Diagnosis not present

## 2023-04-26 DIAGNOSIS — Z741 Need for assistance with personal care: Secondary | ICD-10-CM | POA: Diagnosis not present

## 2023-04-26 DIAGNOSIS — M6281 Muscle weakness (generalized): Secondary | ICD-10-CM | POA: Diagnosis not present

## 2023-04-29 DIAGNOSIS — Z9981 Dependence on supplemental oxygen: Secondary | ICD-10-CM | POA: Diagnosis not present

## 2023-04-29 DIAGNOSIS — R278 Other lack of coordination: Secondary | ICD-10-CM | POA: Diagnosis not present

## 2023-04-29 DIAGNOSIS — J441 Chronic obstructive pulmonary disease with (acute) exacerbation: Secondary | ICD-10-CM | POA: Diagnosis not present

## 2023-04-29 DIAGNOSIS — I4811 Longstanding persistent atrial fibrillation: Secondary | ICD-10-CM | POA: Diagnosis not present

## 2023-04-29 DIAGNOSIS — I4891 Unspecified atrial fibrillation: Secondary | ICD-10-CM | POA: Diagnosis not present

## 2023-04-29 DIAGNOSIS — R2689 Other abnormalities of gait and mobility: Secondary | ICD-10-CM | POA: Diagnosis not present

## 2023-04-29 DIAGNOSIS — J431 Panlobular emphysema: Secondary | ICD-10-CM | POA: Diagnosis not present

## 2023-04-29 DIAGNOSIS — Z741 Need for assistance with personal care: Secondary | ICD-10-CM | POA: Diagnosis not present

## 2023-04-29 DIAGNOSIS — J9621 Acute and chronic respiratory failure with hypoxia: Secondary | ICD-10-CM | POA: Diagnosis not present

## 2023-04-29 DIAGNOSIS — M6281 Muscle weakness (generalized): Secondary | ICD-10-CM | POA: Diagnosis not present

## 2023-04-29 DIAGNOSIS — I2510 Atherosclerotic heart disease of native coronary artery without angina pectoris: Secondary | ICD-10-CM | POA: Diagnosis not present

## 2023-05-01 DIAGNOSIS — J431 Panlobular emphysema: Secondary | ICD-10-CM | POA: Diagnosis not present

## 2023-05-01 DIAGNOSIS — I4891 Unspecified atrial fibrillation: Secondary | ICD-10-CM | POA: Diagnosis not present

## 2023-05-01 DIAGNOSIS — J441 Chronic obstructive pulmonary disease with (acute) exacerbation: Secondary | ICD-10-CM | POA: Diagnosis not present

## 2023-05-01 DIAGNOSIS — M6281 Muscle weakness (generalized): Secondary | ICD-10-CM | POA: Diagnosis not present

## 2023-05-01 DIAGNOSIS — Z9981 Dependence on supplemental oxygen: Secondary | ICD-10-CM | POA: Diagnosis not present

## 2023-05-01 DIAGNOSIS — R2689 Other abnormalities of gait and mobility: Secondary | ICD-10-CM | POA: Diagnosis not present

## 2023-05-01 DIAGNOSIS — I4811 Longstanding persistent atrial fibrillation: Secondary | ICD-10-CM | POA: Diagnosis not present

## 2023-05-01 DIAGNOSIS — I2510 Atherosclerotic heart disease of native coronary artery without angina pectoris: Secondary | ICD-10-CM | POA: Diagnosis not present

## 2023-05-01 DIAGNOSIS — R278 Other lack of coordination: Secondary | ICD-10-CM | POA: Diagnosis not present

## 2023-05-01 DIAGNOSIS — J9621 Acute and chronic respiratory failure with hypoxia: Secondary | ICD-10-CM | POA: Diagnosis not present

## 2023-05-01 DIAGNOSIS — Z741 Need for assistance with personal care: Secondary | ICD-10-CM | POA: Diagnosis not present

## 2023-05-01 NOTE — Progress Notes (Unsigned)
Cardiology Office Note Date:  05/02/2023  Patient ID:  [name] [name]., DOB June 07, 1941, MRN [id number] PCP:  [name], MD  Cardiologist:  Lorine Bears, MD Electrophysiologist: Lanier Prude, MD     Chief Complaint: SOB  History of Present Illness: [name] [name]. is a 82 y.o. male with PMH notable for persis Afib, HFpEF, CAD s/p PCI, COPD on home Os; seen today for Lanier Prude, MD for acute visit due to SOB.   He was recently admitted 8/5-02/2023 for tikosyn loading, chemically cardioverted. Discharged on tikosyn. He saw PA Tillery 9/6 for routine tikosyn follow-up. C/o ?drop in O2 sats with moderate exertion, 80s on one hand, 50s on the other.Occasionally noticed a single, irregular beat. Electrolytes stable at that visit.  He was diagnosed with Covid shortly thereafter.   He messaged clinic earlier this week that he was having an irregular HR, noted by PT. He had also significantly decreased strength in lower extremities. He does not feel palpitations, but when his PT noticed his decreased activity ability, they palpated his pulse and noted that at rest he was having extra beats every 8-9 beats but with activity, he was having extra beat every 3-4 beats. They were concerned he had gone back into AFib.  He sent BP readings via mychart yesterday, both 130s systolic He is very concerned about his decreased activity endurance. Is planning to travel to [facility] in a few weeks and wants to be back to 100% by the trip.   He denies palpitations, chest pain, chest pressure. States his lower extremity edema is significantly improved. Takes lasix 40mg /20mg  alternating days. He denies lightheadedness, dizziness.   Diligently takes tikosyn and eliquis BID, no missed doses. No bleeding concerns.    AAD History: Tikosyn   Past Medical History:  Diagnosis Date   Abdominal discomfort 12/07/2020   Acquired trigger finger of right middle finger 02/04/2020   Acute  prostatitis 04/08/2018   Alcohol abuse    Alcohol dependence (HCC) 12/07/2020   Anal or rectal pain 04/08/2018   Annual physical exam 12/07/2020   Anorectal disorder 12/07/2020   Arthritis    Atherosclerotic heart disease of native coronary artery without angina pectoris 04/08/2018   a. 03/2018 PCI: LM nl, LAD 62m, LCX nl, OM1 70p (2.75x8 Synergy DES), 55m (2.5x24 Synergy DES), RCA 25p, RPL1 50. EF 55-65%.   Atrial flutter (HCC)    a. 07/2022 in setting of hospitalization for resp illness/RSV. CHA2DS2VASc = 4-->eliquis; b. 08/2022 s/p DCCV (100J); c. 09/26/2022 Recurrent Aflutter.   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 02/2022 Carotid U/S: 1-39% bilat ICA stenoses.   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   Contracture of palmar fascia 12/07/2020   COPD (chronic obstructive pulmonary disease) (HCC)    Corn of toe 12/07/2020   Corns and callosities 04/09/2018   Coronary artery disease    2019 with stents   Coronary atherosclerosis 05/07/2018   Degenerative disc disease, 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   Degenerative disc disease, 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   Diarrhea 04/09/2018  Double vision with both eyes open 12/07/2020   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   Dysphagia 08/14/2018   Dyspnea    ED (erectile dysfunction)    ED (erectile dysfunction) of organic origin 12/07/2020   Elevated prostate specific antigen (PSA) 04/08/2018   Elevated PSA    Encounter for screening for other disorder 12/07/2020   Enlarged prostate without lower  urinary tract symptoms (luts) 04/09/2018   Enthesopathy 12/07/2020   Essential (primary) hypertension 04/08/2018   Ex-smoker 04/09/2018   Exertional dyspnea 04/02/2018   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 07/2022 Echo: EF 65-70%, no rwma, mild LVH, nl RV fxn.   Hyperlipidemia    Hypertension    Hypoxemia 12/07/2020   Kidney cysts    Left knee pain 12/07/2020   Low back pain 04/08/2018   Male erectile dysfunction 04/09/2018   Mixed hyperlipidemia 04/08/2018   Nocturia more than twice per night 04/09/2018   Osteoarthritis of first carpometacarpal joint 04/08/2018   Osteoarthritis of knee 12/07/2020   Other long term (current) drug therapy 12/07/2020   Pain due to onychomycosis of toenail of left foot 07/20/2019   Pain in joint 04/09/2018   Pain in knee 04/09/2018   Pain in unspecified knee 12/07/2020   Pain of finger 04/09/2018   Palpitations    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HCC) 12/07/2020   Personal history of colonic polyps 12/07/2020   Pneumonia    Pneumothorax 04/12/2020   Polypharmacy 04/09/2018   Porokeratosis 01/13/2019   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HCC)    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   Pulmonary nodules 06/08/2016   Pure hypercholesterolemia 12/07/2020   Renal cyst 12/07/2020   Sleep disorder 04/08/2018   Status post bronchoscopy 04/12/2020   Tear of rotator cuff 04/09/2018   Tobacco user 12/07/2020   Uncomplicated alcohol dependence (HCC) 04/09/2018   Unspecified rotator cuff tear or rupture of unspecified shoulder, not specified as traumatic 12/07/2020   Unspecified tear of unspecified meniscus, current injury, unspecified knee, initial encounter 12/07/2020   Visual disturbance 12/07/2020   Vitamin D deficiency 04/08/2018    Past Surgical  History:  Procedure Laterality Date   BRONCHIAL BIOPSY  10/13/2019   Procedure: BRONCHIAL BIOPSIES;  Surgeon: Leslye Peer, MD;  Location: [facility] ENDOSCOPY;  Service: Pulmonary;;   BRONCHIAL BIOPSY  04/12/2020   Procedure: BRONCHIAL BIOPSIES;  Surgeon: Leslye Peer, MD;  Location: [facility] ENDOSCOPY;  Service: Pulmonary;;   BRONCHIAL BRUSHINGS  10/13/2019   Procedure: BRONCHIAL BRUSHINGS;  Surgeon: Leslye Peer, MD;  Location: [facility] ENDOSCOPY;  Service: Pulmonary;;   BRONCHIAL BRUSHINGS  04/12/2020   Procedure: BRONCHIAL BRUSHINGS;  Surgeon: Leslye Peer, MD;  Location: [facility] ENDOSCOPY;  Service: Pulmonary;;   BRONCHIAL NEEDLE ASPIRATION BIOPSY  10/13/2019   Procedure: BRONCHIAL NEEDLE ASPIRATION BIOPSIES;  Surgeon: Leslye Peer, MD;  Location: [facility] ENDOSCOPY;  Service: Pulmonary;;   BRONCHIAL NEEDLE ASPIRATION BIOPSY  04/12/2020   Procedure: BRONCHIAL NEEDLE ASPIRATION BIOPSIES;  Surgeon: Leslye Peer, MD;  Location: [facility] ENDOSCOPY;  Service: Pulmonary;;   BRONCHIAL WASHINGS  10/13/2019   Procedure: BRONCHIAL WASHINGS;  Surgeon: Leslye Peer, MD;  Location: [facility] ENDOSCOPY;  Service: Pulmonary;;   BRONCHIAL WASHINGS  04/12/2020   Procedure: BRONCHIAL WASHINGS;  Surgeon: Leslye Peer, MD;  Location: [facility] ENDOSCOPY;  Service: Pulmonary;;   CARDIAC  CATHETERIZATION  2002   50% RCA   CARDIOVERSION N/A 09/17/2022   Procedure: CARDIOVERSION;  Surgeon: Iran Ouch, MD;  Location: [facility] ORS;  Service: Cardiovascular;  Laterality: N/A;   CATARACT EXTRACTION, BILATERAL     CORONARY STENT INTERVENTION N/A 04/15/2018   Procedure: CORONARY STENT INTERVENTION;  Surgeon: Corky Crafts, MD;  Location: [facility] INVASIVE CV LAB;  Service: Cardiovascular;  Laterality: N/A;  om1   EYE SURGERY     KNEE ARTHROSCOPY     LEFT HEART CATH AND CORONARY ANGIOGRAPHY N/A 04/15/2018   Procedure: LEFT HEART CATH AND CORONARY ANGIOGRAPHY;  Surgeon: Corky Crafts, MD;  Location: [facility] INVASIVE CV LAB;  Service:  Cardiovascular;  Laterality: N/A;   MULTIPLE TOOTH EXTRACTIONS     TONSILLECTOMY     VIDEO BRONCHOSCOPY  04/12/2020   VIDEO BRONCHOSCOPY WITH ENDOBRONCHIAL NAVIGATION N/A 07/26/2016   Procedure: VIDEO BRONCHOSCOPY WITH ENDOBRONCHIAL NAVIGATION;  Surgeon: Leslye Peer, MD;  Location: [facility] OR;  Service: Thoracic;  Laterality: N/A;   VIDEO BRONCHOSCOPY WITH ENDOBRONCHIAL NAVIGATION N/A 10/13/2019   Procedure: [facility] AND VIDEO BRONCHOSCOPY WITH ENDOBRONCHIAL NAVIGATION;  Surgeon: Leslye Peer, MD;  Location: [facility] ENDOSCOPY;  Service: Pulmonary;  Laterality: N/A;   VIDEO BRONCHOSCOPY WITH ENDOBRONCHIAL NAVIGATION N/A 04/12/2020   Procedure: VIDEO BRONCHOSCOPY WITH ENDOBRONCHIAL NAVIGATION;  Surgeon: Leslye Peer, MD;  Location: [facility] ENDOSCOPY;  Service: Pulmonary;  Laterality: N/A;    Current Outpatient Medications  Medication Instructions   acetaminophen (TYLENOL) 650 mg, Oral, Every 4 hours PRN   albuterol (PROVENTIL) 2.5 mg, Nebulization, 2 times daily   albuterol (VENTOLIN HFA) 108 (90 Base) MCG/ACT inhaler 2 puffs, Inhalation, 3 times daily PRN   apixaban (ELIQUIS) 5 mg, Oral, 2 times daily   Budeson-Glycopyrrol-Formoterol (BREZTRI AEROSPHERE) 160-9-4.8 MCG/ACT AERO 2 puffs, Inhalation, 2 times daily   clonazePAM (KLONOPIN) 0.5 mg, Oral, Nightly   diltiazem (CARDIZEM CD) 120 mg, Oral, 2 times daily   Docusate Calcium (STOOL SOFTENER PO) 1 tablet, Oral, Every evening   dofetilide (TIKOSYN) 250 mcg, Oral, 2 times daily   finasteride (PROSCAR) 5 mg, Oral, Daily   furosemide (LASIX) 20 MG tablet TAKE 1 TABLET BY MOUTH EVERY DAY WITH ADDITIONAL TABLET EVERY OTHER DAY   furosemide (LASIX) 40 mg, Oral, Every other day   gabapentin (NEURONTIN) 200 mg, Oral, Daily at bedtime   ipratropium (ATROVENT) 0.03 % nasal spray 2 sprays, Each Nare, Every 12 hours   ipratropium-albuterol (DUONEB) 0.5-2.5 (3) MG/3ML SOLN INHALE 3 ML BY NEBULIZER EVERY 6 HOURS AS NEEDED   nitroGLYCERIN (NITROSTAT) 0.4 mg,  Sublingual, Every 5 min PRN   OXYGEN Inhalation, 2 liters upon exertion and 3 liters continuous at night.   pantoprazole (PROTONIX) 40 mg, Oral, Daily   polyethylene glycol (MIRALAX / GLYCOLAX) 17 g, Oral, Daily PRN   predniSONE (DELTASONE) 10 mg, Oral, Daily with breakfast   rosuvastatin (CRESTOR) 20 mg, Oral, Daily   silver sulfADIAZINE (SILVADENE) 1 % cream 1 Application, Topical, Daily    Social History:  The patient  reports that he quit smoking about 2 years ago. His smoking use included cigarettes. He started smoking about 70 years ago. He has a 51 pack-year smoking history. He has never been exposed to tobacco smoke. He has never used smokeless tobacco. He reports that he does not drink alcohol and does not use drugs.   Family History:  The patient's family history includes Alzheimer's disease in his mother; Hypertension in his mother.  ROS:  Please see the history  of present illness. All other systems are reviewed and otherwise negative.   PHYSICAL EXAM:  VS:  BP (!) 98/40 (BP Location: Left Arm)   Pulse 86   Ht 5' 10.5" (1.791 m)   Wt 159 lb (72.1 kg)   SpO2 98%   BMI 22.49 kg/m  BMI: Body mass index is 22.49 kg/m.  GEN- The patient is well appearing, alert and oriented x 3 today.   Lungs- RLL Rhonchi, otherwise clear to ausculation bilaterally, normal work of breathing.  Heart- Regular rate with ectopy, no murmurs, rubs or gallops Extremities- Trace peripheral edema, warm, dry   EKG is ordered. Personal review of EKG from today shows:   EKG Interpretation Date/Time:  Thursday May 02 2023 14:03:14 EDT Ventricular Rate:  86 PR Interval:  152 QRS Duration:  72 QT Interval:  400 QTC Calculation: 478 R Axis:   96  Text Interpretation: Sinus rhythm with Premature atrial complexes Rightward axis Confirmed by Sherie Don [phone]) on 05/02/2023 2:05:17 PM    04/05/2023 - NSR, rate 79 QT 406; QTC 465   Recent Labs: 05/30/2022: Pro B Natriuretic peptide (BNP)  34.0 08/12/2022: TSH 0.700 08/30/2022: B Natriuretic Peptide 26.7 09/22/2022: ALT 15; Hemoglobin 11.7; Platelets 368 04/05/2023: BUN 28; Creatinine, Ser 1.32; Magnesium 2.2; Potassium 4.6; Sodium 143  08/13/2022: Cholesterol 141; HDL 80; LDL Cholesterol 52; Total CHOL/HDL Ratio 1.8; Triglycerides 46; VLDL 9   CrCl cannot be calculated (Patient's most recent lab result is older than the maximum 21 days allowed.).   Wt Readings from Last 3 Encounters:  05/02/23 159 lb (72.1 kg)  04/16/23 159 lb (72.1 kg)  04/08/23 159 lb (72.1 kg)     Additional studies reviewed include: Previous EP, cardiology notes.   TTE, 08/06/2022  1. Tachycardia noted. Left ventricular ejection fraction, by estimation, is 65 to 70%. The left ventricle has normal function. The left ventricle has no regional wall motion abnormalities. There is mild left ventricular hypertrophy. Left ventricular diastolic parameters are indeterminate. There is the interventricular septum is flattened in systole and diastole, consistent with right ventricular pressure and volume overload.   2. Right ventricular systolic function is normal. The right ventricular size is normal. Tricuspid regurgitation signal is inadequate for assessing PA pressure.   3. The mitral valve is normal in structure. No evidence of mitral valve regurgitation. No evidence of mitral stenosis.   4. The aortic valve is normal in structure. Aortic valve regurgitation is not visualized. No aortic stenosis is present.   5. The inferior vena cava is normal in size with greater than 50% respiratory variability, suggesting right atrial pressure of 3 mmHg.   LHC, 04/15/2018 Prox RCA lesion is 25% stenosed. 1st RPLB lesion is 50% stenosed. THis is a very small vessel. Mid LAD lesion is 25% stenosed. 1st Mrg lesion is 90% stenosed. A drug-eluting stent was successfully placed using a STENT SYNERGY DES 2.5X24. Post intervention, there is a 0% residual stenosis. Ost 1st Mrg lesion is  70% stenosed. A drug-eluting stent was successfully placed using a STENT SYNERGY DES 2.75X8. Post intervention, there is a 0% residual stenosis. The left ventricular systolic function is normal. LV end diastolic pressure is normal. The left ventricular ejection fraction is 55-65% by visual estimate. There is no aortic valve stenosis.  ASSESSMENT AND PLAN:  #) persis Afib #) PAC #) decreased exercise capacity #) recent Covid #) COPD Tolerating tikosyn BID EKG with stable intervals PACs on today's EKG - 3 day monitor to further eval  PAC burden Unable to increase dilt d/t hypotension Continue 120mg  cardizem BID Update CBC, Bmet, Mag Recommend follow-up with Pulm for ongoing COPD mgmt  #) Hypercoag d/t persis afib CHA2DS2-VASc Score = 4 [CHF History: 0, HTN History: 1, Diabetes History: 0, Stroke History: 0, Vascular Disease History: 1, Age Score: 2, Gender Score: 0].  Therefore, the patient's annual risk of stroke is 4.8 %.    Stroke ppx - 5mg  eliquis BID, appropriately dosed No bleeding concerns  #) HFpEF #) Hypotension Appears euvolemic with trace lower extremity edema Recommend he check BP and pulse several times a week to confirm that he is in fact normotensive. Discussed that dilt and/or lasix may need to be adjusted now that he is maintaining sinus rhythm At this time, continue 40mg /20mg  lasix alternative days       Current medicines are reviewed at length with the patient today.   The patient has concerns regarding his medicines.  The following changes were made today:  none  Labs/ tests ordered today include:  Orders Placed This Encounter  Procedures   CBC   Basic metabolic panel   Magnesium   LONG TERM MONITOR (3-14 DAYS)   EKG 12-Lead     Disposition: Follow up with EP APP  in 2 months as scheduled    Signed, Sherie Don, NP  05/02/23  2:41 PM  Electrophysiology [facility]

## 2023-05-01 NOTE — Telephone Encounter (Signed)
Judging from pt's s/sx I've scheduled an appt with Rosalita Chessman 10/3 at 1400

## 2023-05-02 ENCOUNTER — Ambulatory Visit: Payer: Medicare Other

## 2023-05-02 ENCOUNTER — Encounter: Payer: Self-pay | Admitting: Cardiology

## 2023-05-02 ENCOUNTER — Ambulatory Visit: Payer: Medicare Other | Attending: Cardiology | Admitting: Cardiology

## 2023-05-02 VITALS — BP 98/40 | HR 86 | Ht 70.5 in | Wt 159.0 lb

## 2023-05-02 DIAGNOSIS — I4819 Other persistent atrial fibrillation: Secondary | ICD-10-CM

## 2023-05-02 DIAGNOSIS — Z79899 Other long term (current) drug therapy: Secondary | ICD-10-CM

## 2023-05-02 DIAGNOSIS — D6869 Other thrombophilia: Secondary | ICD-10-CM

## 2023-05-02 NOTE — Patient Instructions (Signed)
Medication Instructions:  The current medical regimen is effective;  continue present plan and medications.  *If you need a refill on your cardiac medications before your next appointment, please call your pharmacy*   Lab Work: Your provider would like for you to have following labs drawn today CBC, BMET, MAG.   If you have labs (blood work) drawn today and your tests are completely normal, you will receive your results only by: MyChart Message (if you have MyChart) OR A paper copy in the mail If you have any lab test that is abnormal or we need to change your treatment, we will call you to review the results.   Testing/Procedures: Christena Deem- Long Term Monitor Instructions  Your physician has requested you wear a ZIO patch monitor for 3 days.  This is a single patch monitor. [employer] supplies one patch monitor per enrollment. Additional stickers are not available. Please do not apply patch if you will be having a Nuclear Stress Test,  Echocardiogram, Cardiac CT, MRI, or Chest Xray during the period you would be wearing the  monitor. The patch cannot be worn during these tests. You cannot remove and re-apply the  ZIO XT patch monitor.  Your ZIO patch monitor will be mailed 3 day USPS to your address on file. It may take 3-5 days  to receive your monitor after you have been enrolled.  Once you have received your monitor, please review the enclosed instructions. Your monitor  has already been registered assigning a specific monitor serial # to you.  Billing and Patient Assistance Program Information  We have supplied [employer] with any of your insurance information on file for billing purposes. [employer] offers a sliding scale Patient Assistance Program for patients that do not have  insurance, or whose insurance does not completely cover the cost of the ZIO monitor.  You must apply for the Patient Assistance Program to qualify for this discounted rate.  To apply, please call [employer] at  [phone], select option 4, select option 2, ask to apply for  Patient Assistance Program. Meredeth Ide will ask your household income, and how many people  are in your household. They will quote your out-of-pocket cost based on that information.  [employer] will also be able to set up a 28-month, interest-free payment plan if needed.  Applying the monitor   Shave hair from upper left chest.  Hold abrader disc by orange tab. Rub abrader in 40 strokes over the upper left chest as  indicated in your monitor instructions.  Clean area with 4 enclosed alcohol pads. Let dry.  Apply patch as indicated in monitor instructions. Patch will be placed under collarbone on left  side of chest with arrow pointing upward.  Rub patch adhesive wings for 2 minutes. Remove white label marked "1". Remove the white  label marked "2". Rub patch adhesive wings for 2 additional minutes.  While looking in a mirror, press and release button in center of patch. A small green light will  flash 3-4 times. This will be your only indicator that the monitor has been turned on.  Do not shower for the first 24 hours. You may shower after the first 24 hours.  Press the button if you feel a symptom. You will hear a small click. Record Date, Time and  Symptom in the Patient Logbook.  When you are ready to remove the patch, follow instructions on the last 2 pages of Patient  Logbook. Stick patch monitor onto the last page of Patient Logbook.  Place Patient Logbook in the blue and white box. Use locking tab on box and tape box closed  securely. The blue and white box has prepaid postage on it. Please place it in the mailbox as  soon as possible. Your physician should have your test results approximately 7 days after the  monitor has been mailed back to [facility].  Call [facility] Customer Care at [phone] if you have questions regarding  your ZIO XT patch monitor. Call them immediately if you see an orange light  blinking on your  monitor.  If your monitor falls off in less than 4 days, contact our Monitor department at [phone].  If your monitor becomes loose or falls off after 4 days call [employer] at [phone] for  suggestions on securing your monitor    Follow-Up: At [facility], you and your health needs are our priority.  As part of our continuing mission to provide you with exceptional heart care, we have created designated Provider Care Teams.  These Care Teams include your primary Cardiologist (physician) and Advanced Practice Providers (APPs -  Physician Assistants and Nurse Practitioners) who all work together to provide you with the care you need, when you need it.  We recommend signing up for the patient portal called "MyChart".  Sign up information is provided on this After Visit Summary.  MyChart is used to connect with patients for Virtual Visits (Telemedicine).  Patients are able to view lab/test results, encounter notes, upcoming appointments, etc.  Non-urgent messages can be sent to your provider as well.   To learn more about what you can do with MyChart, go to [email or web address].    Your next appointment:   As scheduled in December  Other Instructions CHECK BP AT HOME- use log provided.

## 2023-05-03 DIAGNOSIS — Z741 Need for assistance with personal care: Secondary | ICD-10-CM | POA: Diagnosis not present

## 2023-05-03 DIAGNOSIS — J441 Chronic obstructive pulmonary disease with (acute) exacerbation: Secondary | ICD-10-CM | POA: Diagnosis not present

## 2023-05-03 DIAGNOSIS — R2689 Other abnormalities of gait and mobility: Secondary | ICD-10-CM | POA: Diagnosis not present

## 2023-05-03 DIAGNOSIS — J431 Panlobular emphysema: Secondary | ICD-10-CM | POA: Diagnosis not present

## 2023-05-03 DIAGNOSIS — J9621 Acute and chronic respiratory failure with hypoxia: Secondary | ICD-10-CM | POA: Diagnosis not present

## 2023-05-03 DIAGNOSIS — I4811 Longstanding persistent atrial fibrillation: Secondary | ICD-10-CM | POA: Diagnosis not present

## 2023-05-03 DIAGNOSIS — M6281 Muscle weakness (generalized): Secondary | ICD-10-CM | POA: Diagnosis not present

## 2023-05-03 DIAGNOSIS — Z9981 Dependence on supplemental oxygen: Secondary | ICD-10-CM | POA: Diagnosis not present

## 2023-05-03 DIAGNOSIS — I2510 Atherosclerotic heart disease of native coronary artery without angina pectoris: Secondary | ICD-10-CM | POA: Diagnosis not present

## 2023-05-03 DIAGNOSIS — I4891 Unspecified atrial fibrillation: Secondary | ICD-10-CM | POA: Diagnosis not present

## 2023-05-03 DIAGNOSIS — R278 Other lack of coordination: Secondary | ICD-10-CM | POA: Diagnosis not present

## 2023-05-03 LAB — BASIC METABOLIC PANEL
BUN/Creatinine Ratio: 25 — ABNORMAL HIGH (ref 10–24)
BUN: 40 mg/dL — ABNORMAL HIGH (ref 8–27)
CO2: 28 mmol/L (ref 20–29)
Calcium: 8.9 mg/dL (ref 8.6–10.2)
Chloride: 101 mmol/L (ref 96–106)
Creatinine, Ser: 1.59 mg/dL — ABNORMAL HIGH (ref 0.76–1.27)
Glucose: 91 mg/dL (ref 70–99)
Potassium: 4.9 mmol/L (ref 3.5–5.2)
Sodium: 146 mmol/L — ABNORMAL HIGH (ref 134–144)
eGFR: 43 mL/min/{1.73_m2} — ABNORMAL LOW (ref >59)

## 2023-05-03 LAB — CBC
Hematocrit: 46.9 % (ref 37.5–51.0)
Hemoglobin: 15.3 g/dL (ref 13.0–17.7)
MCH: 30.7 pg (ref 26.6–33.0)
MCHC: 32.6 g/dL (ref 31.5–35.7)
MCV: 94 fL (ref 79–97)
Platelets: 226 10*3/uL (ref 150–450)
RBC: 4.98 x10E6/uL (ref 4.14–5.80)
RDW: 13.1 % (ref 11.6–15.4)
WBC: 15.4 10*3/uL — ABNORMAL HIGH (ref 3.4–10.8)

## 2023-05-03 LAB — MAGNESIUM: Magnesium: 2.6 mg/dL — ABNORMAL HIGH (ref 1.6–2.3)

## 2023-05-06 DIAGNOSIS — J9621 Acute and chronic respiratory failure with hypoxia: Secondary | ICD-10-CM | POA: Diagnosis not present

## 2023-05-06 DIAGNOSIS — D6869 Other thrombophilia: Secondary | ICD-10-CM

## 2023-05-06 DIAGNOSIS — Z79899 Other long term (current) drug therapy: Secondary | ICD-10-CM | POA: Diagnosis not present

## 2023-05-06 DIAGNOSIS — I4891 Unspecified atrial fibrillation: Secondary | ICD-10-CM | POA: Diagnosis not present

## 2023-05-06 DIAGNOSIS — Z741 Need for assistance with personal care: Secondary | ICD-10-CM | POA: Diagnosis not present

## 2023-05-06 DIAGNOSIS — R2689 Other abnormalities of gait and mobility: Secondary | ICD-10-CM | POA: Diagnosis not present

## 2023-05-06 DIAGNOSIS — I4819 Other persistent atrial fibrillation: Secondary | ICD-10-CM

## 2023-05-06 DIAGNOSIS — J431 Panlobular emphysema: Secondary | ICD-10-CM | POA: Diagnosis not present

## 2023-05-06 DIAGNOSIS — I2510 Atherosclerotic heart disease of native coronary artery without angina pectoris: Secondary | ICD-10-CM | POA: Diagnosis not present

## 2023-05-06 DIAGNOSIS — H524 Presbyopia: Secondary | ICD-10-CM | POA: Diagnosis not present

## 2023-05-06 DIAGNOSIS — Z9981 Dependence on supplemental oxygen: Secondary | ICD-10-CM | POA: Diagnosis not present

## 2023-05-06 DIAGNOSIS — J441 Chronic obstructive pulmonary disease with (acute) exacerbation: Secondary | ICD-10-CM | POA: Diagnosis not present

## 2023-05-06 DIAGNOSIS — I4811 Longstanding persistent atrial fibrillation: Secondary | ICD-10-CM | POA: Diagnosis not present

## 2023-05-06 DIAGNOSIS — M6281 Muscle weakness (generalized): Secondary | ICD-10-CM | POA: Diagnosis not present

## 2023-05-06 DIAGNOSIS — R278 Other lack of coordination: Secondary | ICD-10-CM | POA: Diagnosis not present

## 2023-05-08 ENCOUNTER — Ambulatory Visit: Payer: Medicare Other

## 2023-05-08 ENCOUNTER — Ambulatory Visit: Payer: Medicare Other | Admitting: Pulmonary Disease

## 2023-05-08 ENCOUNTER — Encounter: Payer: Self-pay | Admitting: Pulmonary Disease

## 2023-05-08 ENCOUNTER — Ambulatory Visit: Payer: Self-pay

## 2023-05-08 VITALS — BP 92/51 | HR 98 | Ht 70.5 in | Wt 158.0 lb

## 2023-05-08 DIAGNOSIS — I4891 Unspecified atrial fibrillation: Secondary | ICD-10-CM | POA: Diagnosis not present

## 2023-05-08 DIAGNOSIS — J431 Panlobular emphysema: Secondary | ICD-10-CM | POA: Diagnosis not present

## 2023-05-08 DIAGNOSIS — J441 Chronic obstructive pulmonary disease with (acute) exacerbation: Secondary | ICD-10-CM

## 2023-05-08 DIAGNOSIS — R278 Other lack of coordination: Secondary | ICD-10-CM | POA: Diagnosis not present

## 2023-05-08 DIAGNOSIS — R918 Other nonspecific abnormal finding of lung field: Secondary | ICD-10-CM | POA: Diagnosis not present

## 2023-05-08 DIAGNOSIS — M6281 Muscle weakness (generalized): Secondary | ICD-10-CM | POA: Diagnosis not present

## 2023-05-08 DIAGNOSIS — R0600 Dyspnea, unspecified: Secondary | ICD-10-CM | POA: Diagnosis not present

## 2023-05-08 DIAGNOSIS — J439 Emphysema, unspecified: Secondary | ICD-10-CM | POA: Diagnosis not present

## 2023-05-08 DIAGNOSIS — R2689 Other abnormalities of gait and mobility: Secondary | ICD-10-CM | POA: Diagnosis not present

## 2023-05-08 DIAGNOSIS — I4811 Longstanding persistent atrial fibrillation: Secondary | ICD-10-CM | POA: Diagnosis not present

## 2023-05-08 DIAGNOSIS — I2510 Atherosclerotic heart disease of native coronary artery without angina pectoris: Secondary | ICD-10-CM | POA: Diagnosis not present

## 2023-05-08 DIAGNOSIS — Z741 Need for assistance with personal care: Secondary | ICD-10-CM | POA: Diagnosis not present

## 2023-05-08 DIAGNOSIS — J9621 Acute and chronic respiratory failure with hypoxia: Secondary | ICD-10-CM | POA: Diagnosis not present

## 2023-05-08 DIAGNOSIS — Z9981 Dependence on supplemental oxygen: Secondary | ICD-10-CM | POA: Diagnosis not present

## 2023-05-08 MED ORDER — METHYLPREDNISOLONE ACETATE 80 MG/ML IJ SUSP
120.0000 mg | Freq: Once | INTRAMUSCULAR | Status: DC
Start: 2023-05-08 — End: 2023-06-03

## 2023-05-08 MED ORDER — PREDNISONE 10 MG PO TABS: ORAL_TABLET | ORAL | 0 refills | Status: DC

## 2023-05-08 MED ORDER — AZITHROMYCIN 250 MG PO TABS: ORAL_TABLET | ORAL | 0 refills | Status: AC

## 2023-05-08 NOTE — Patient Instructions (Signed)
VISIT SUMMARY:  During your visit today, we discussed your ongoing respiratory distress following your recent COVID-19 infection and your history of COPD (Chronic Obstructive Pulmonary Disease) and atrial fibrillation. You mentioned that your breathing has not returned to its normal state and that you've been experiencing difficulty breathing, especially today. You also mentioned an increase in mucus production and coughing last week, which has since improved. You have been using a nebulizer intermittently and are on chronic oxygen therapy, which you have had to increase when walking due to your current symptoms.  YOUR PLAN:  -COPD EXACERBATION: COPD, or Chronic Obstructive Pulmonary Disease, is a chronic lung disease that makes it hard to breathe. Given your recent COVID-19 infection and persistent difficulty breathing, we will administer a steroid injection today, start you on a medication called Azithromycin (Z-Pak), and increase your Prednisone to 60mg  daily with a slow taper over 2-3 weeks. We will also order a chest x-ray today to check for any abnormalities in your lungs.  -CHRONIC PREDNISONE USE: Prednisone is a medication that reduces inflammation in the body. You are currently on a daily dose of 10mg  for an unspecified chronic condition. We will continue this regimen.  -ATRIAL FIBRILLATION: Atrial fibrillation is a heart condition that causes an irregular and often rapid heart rate. Your condition is being managed by a cardiologist and there are no changes to your current management plan.  INSTRUCTIONS:  After the interventions today, we will need to follow up to see how you are responding to the treatments and to review the results of your chest x-ray. Please continue to take your medications as prescribed and reach out if you have any concerns or if your symptoms worsen.

## 2023-05-08 NOTE — Progress Notes (Signed)
[name] [name]    [id number]    1940/12/19  Primary Care Physician:Letvak, Berneda Rose, MD  Referring Physician: [name], MD [address] [place],  [place] [address]  Chief complaint: Acute visit for COPD exacerbation  HPI:  Discussed the use of AI scribe software for clinical note transcription with the patient, who gave verbal consent to proceed.  The patient, with a history of COPD, atrial fibrillation, and recent COVID-19 infection 1 month ago, presents with persistent respiratory distress. They report that their breathing has not returned to their pre-COVID baseline, with today being particularly difficult. They describe not having had an 'easy breath' yet today. They were treated with Paxlovid for their COVID-19 infection and self-administered a prednisone taper, but report that their breathing has not significantly improved. They also report increased mucus production and coughing last week, which has since improved. They have been using a nebulizer intermittently and are on chronic oxygen therapy, which they have had to increase when walking due to their current symptoms. They also report a recent hospitalization for RSV in January, which significantly impacted their health, and a recent initiation of Tikosyn for atrial fibrillation.    Outpatient Encounter Medications as of 05/08/2023  Medication Sig   acetaminophen (TYLENOL) 325 MG tablet Take 650 mg by mouth every 4 (four) hours as needed.   albuterol (PROVENTIL) (2.5 MG/3ML) 0.083% nebulizer solution Take 2.5 mg by nebulization 2 (two) times daily.   albuterol (VENTOLIN HFA) 108 (90 Base) MCG/ACT inhaler Inhale 2 puffs into the lungs 3 (three) times daily as needed for wheezing or shortness of breath.   apixaban (ELIQUIS) 5 MG TABS tablet Take 1 tablet (5 mg total) by mouth 2 (two) times daily.   Budeson-Glycopyrrol-Formoterol (BREZTRI AEROSPHERE) 160-9-4.8 MCG/ACT AERO Inhale 2 puffs into the lungs  2 (two) times daily.   clonazePAM (KLONOPIN) 0.5 MG tablet Take 1 tablet (0.5 mg total) by mouth at bedtime.   diltiazem (CARDIZEM CD) 120 MG 24 hr capsule Take 1 capsule (120 mg total) by mouth 2 (two) times daily.   Docusate Calcium (STOOL SOFTENER PO) Take 1 tablet by mouth every evening.   dofetilide (TIKOSYN) 250 MCG capsule Take 1 capsule (250 mcg total) by mouth 2 (two) times daily.   finasteride (PROSCAR) 5 MG tablet Take 5 mg by mouth daily.   furosemide (LASIX) 20 MG tablet TAKE 1 TABLET BY MOUTH EVERY DAY WITH ADDITIONAL TABLET EVERY OTHER DAY   furosemide (LASIX) 40 MG tablet Take 40 mg by mouth every other day.   gabapentin (NEURONTIN) 100 MG capsule Take 200 mg by mouth at bedtime.   ipratropium (ATROVENT) 0.03 % nasal spray Place 2 sprays into both nostrils every 12 (twelve) hours.   ipratropium-albuterol (DUONEB) 0.5-2.5 (3) MG/3ML SOLN INHALE 3 ML BY NEBULIZER EVERY 6 HOURS AS NEEDED   nitroGLYCERIN (NITROSTAT) 0.4 MG SL tablet Place 1 tablet (0.4 mg total) under the tongue every 5 (five) minutes as needed for chest pain.   OXYGEN Inhale into the lungs. 2 liters upon exertion and 3 liters continuous at night.   pantoprazole (PROTONIX) 40 MG tablet Take 1 tablet (40 mg total) by mouth daily.   polyethylene glycol (MIRALAX / GLYCOLAX) 17 g packet Take 17 g by mouth daily as needed for moderate constipation.   predniSONE (DELTASONE) 10 MG tablet Take 1 tablet (10 mg total) by mouth daily with breakfast.   rosuvastatin (CRESTOR) 20 MG tablet Take  1 tablet (20 mg total) by mouth daily.   silver sulfADIAZINE (SILVADENE) 1 % cream Apply 1 Application topically daily.   No facility-administered encounter medications on file as of 05/08/2023.   Physical Exam: Blood pressure (!) 92/51, pulse 98, height 5' 10.5" (1.791 m), weight 158 lb (71.7 kg), SpO2 95%. Gen:      No acute distress HEENT:  EOMI, sclera anicteric Neck:     No masses; no thyromegaly Lungs:    Clear to auscultation  bilaterally; normal respiratory effort CV:         Regular rate and rhythm; no murmurs Abd:      + bowel sounds; soft, non-tender; no palpable masses, no distension Ext:    No edema; adequate peripheral perfusion Skin:      Warm and dry; no rash Neuro: alert and oriented x 3 Psych: normal mood and affect  Data Reviewed: Imaging: CT chest 11/07/2022-stable nodular scarring in the right middle lobe, multiple areas of nodularity stable/improved compared to prior I have reviewed the images personally  PFTs:  Labs:  Assessment and Plan COPD Exacerbation Recent COVID-19 infection with persistent dyspnea and increased oxygen requirements. Minimal wheezing on exam. Recent self-administered prednisone taper with limited improvement. No recent antibiotics or imaging. -Administer steroid injection today. -Start Azithromycin (Z-Pak). -Initiate Prednisone 60mg  daily with a slow taper over 2-3 weeks. -Order chest x-ray today to assess for any abnormalities.  Chronic Prednisone Use On 10mg  daily for COPD. -Continue current regimen.  Atrial Fibrillation Managed by cardiology. Recent heart rate monitoring. -No changes to current management plan.  Follow-up after interventions and imaging results.   Chilton Greathouse MD [facility] 05/08/2023, 1:45 PM  CC: [name], MD

## 2023-05-08 NOTE — Patient Outreach (Signed)
Care Coordination   05/08/2023 Name: [name] [name]. MRN: [id number] DOB: 1941/04/30   Care Coordination Outreach Attempts:  An unsuccessful telephone outreach was attempted for a scheduled appointment today. HIPAA compliant voice message left with call back phone number.   Follow Up Plan:  Additional outreach attempts will be made to offer the patient care coordination information and services.   Encounter Outcome:  No Answer   Care Coordination Interventions:  No, not indicated    [facility] Care Coordination [phone] direct line

## 2023-05-09 ENCOUNTER — Encounter: Payer: Self-pay | Admitting: Podiatry

## 2023-05-09 ENCOUNTER — Ambulatory Visit: Payer: Medicare Other | Admitting: Podiatry

## 2023-05-09 DIAGNOSIS — M79675 Pain in left toe(s): Secondary | ICD-10-CM | POA: Diagnosis not present

## 2023-05-09 DIAGNOSIS — M79674 Pain in right toe(s): Secondary | ICD-10-CM | POA: Diagnosis not present

## 2023-05-09 DIAGNOSIS — D689 Coagulation defect, unspecified: Secondary | ICD-10-CM | POA: Diagnosis not present

## 2023-05-09 DIAGNOSIS — B351 Tinea unguium: Secondary | ICD-10-CM

## 2023-05-09 NOTE — Progress Notes (Signed)
Subjective:  Patient ID: [name] Register., male    DOB: Dec 23, 1940,  MRN: [id number]  82 y.o. male presents with  Chief Complaint  Patient presents with   RFC   at risk foot care with h/o coagulation defect and painful elongated mycotic toenails 1-5 bilaterally which are tender when wearing enclosed shoe gear. Pain is relieved with periodic professional debridement. Patient has h/o ulcer distal tip of left 2nd toe. Digit has healed per Dr. Allena Katz.   PCP: [name], MD.  New problem(s): None.   Review of Systems: Negative except as noted in the HPI.   Allergies  Allergen Reactions   Flagyl [Metronidazole] Other (See Comments)    Other reaction(s): skin reaction    Objective:  There were no vitals filed for this visit. Constitutional Patient is a pleasant 82 y.o. Caucasian male in NAD. AAO x 3.  Vascular Capillary fill time to digits <3 seconds.  DP/PT pulse(s) are faintly palpable b/l lower extremities. Pedal hair absent b/l. Lower extremity skin temperature gradient warm to cool b/l. No pain with calf compression b/l. No cyanosis or clubbing noted. No ischemia nor gangrene noted b/l.   Neurologic Protective sensation intact 5/5 intact bilaterally with 10g monofilament b/l. Vibratory sensation intact b/l. No clonus b/l.   Dermatologic Pedal skin is thin, shiny and atrophic b/l.  No open wounds b/l lower extremities. No interdigital macerations b/l lower extremities. Toenails 1-5 b/l elongated, discolored, dystrophic, thickened, crumbly with subungual debris and tenderness to dorsal palpation. No hyperkeratotic nor porokeratotic lesions present on today's visit.  Orthopedic: Normal muscle strength 5/5 to all lower extremity muscle groups bilaterally. No pain, crepitus or joint limitation noted with ROM bilateral LE. Hammertoe deformity noted 2-5 b/l.   Last HgA1c:     Latest Ref Rng & Units 08/13/2022    1:29 AM  Hemoglobin A1C  Hemoglobin-A1c 4.8 - 5.6 % 5.0     Assessment:   1. Pain due to onychomycosis of toenails of both feet   2. Coagulation disorder (HCC)    Plan:  Patient was evaluated and treated and all questions answered. Consent given for treatment as described below: -Consent given for treatment as described below: -Examined patient. -Patient to continue soft, supportive shoe gear daily. -Toenails 1-5 b/l were debrided in length and girth with sterile nail nippers and dremel without iatrogenic bleeding.  -Patient/POA to call should there be question/concern in the interim.  Return in about 9 weeks (around 07/11/2023).  Freddie Breech, DPM

## 2023-05-10 DIAGNOSIS — J431 Panlobular emphysema: Secondary | ICD-10-CM | POA: Diagnosis not present

## 2023-05-10 DIAGNOSIS — Z9981 Dependence on supplemental oxygen: Secondary | ICD-10-CM | POA: Diagnosis not present

## 2023-05-10 DIAGNOSIS — R2689 Other abnormalities of gait and mobility: Secondary | ICD-10-CM | POA: Diagnosis not present

## 2023-05-10 DIAGNOSIS — I2510 Atherosclerotic heart disease of native coronary artery without angina pectoris: Secondary | ICD-10-CM | POA: Diagnosis not present

## 2023-05-10 DIAGNOSIS — Z741 Need for assistance with personal care: Secondary | ICD-10-CM | POA: Diagnosis not present

## 2023-05-10 DIAGNOSIS — R278 Other lack of coordination: Secondary | ICD-10-CM | POA: Diagnosis not present

## 2023-05-10 DIAGNOSIS — J441 Chronic obstructive pulmonary disease with (acute) exacerbation: Secondary | ICD-10-CM | POA: Diagnosis not present

## 2023-05-10 DIAGNOSIS — I4891 Unspecified atrial fibrillation: Secondary | ICD-10-CM | POA: Diagnosis not present

## 2023-05-10 DIAGNOSIS — J9621 Acute and chronic respiratory failure with hypoxia: Secondary | ICD-10-CM | POA: Diagnosis not present

## 2023-05-10 DIAGNOSIS — M6281 Muscle weakness (generalized): Secondary | ICD-10-CM | POA: Diagnosis not present

## 2023-05-10 DIAGNOSIS — I4811 Longstanding persistent atrial fibrillation: Secondary | ICD-10-CM | POA: Diagnosis not present

## 2023-05-12 ENCOUNTER — Encounter: Payer: Self-pay | Admitting: Pulmonary Disease

## 2023-05-13 ENCOUNTER — Encounter: Payer: Self-pay | Admitting: Pulmonary Disease

## 2023-05-13 NOTE — Telephone Encounter (Addendum)
Dr. Isaiah Serge, please advise on his chest xray and message sent today(10/14). I have copied it below.  [name].  to [facility] Pool (supporting Chilton Greathouse, MD)      05/13/23  2:08 PM This is worse day yet. I can not hit 1000 on spirometer where a week ago I was at 1400. Have finished z pack and working on prednisone. I can not sit without being out of breath.  Have used Albuteral sulfate and Impro----bromide nebulizer. No help.

## 2023-05-13 NOTE — Telephone Encounter (Signed)
This message was copied into another message that was sent yesterday(10/13). Please see patient message from 10/13.  Nothing further needed.

## 2023-05-14 ENCOUNTER — Telehealth: Payer: Self-pay | Admitting: *Deleted

## 2023-05-14 NOTE — Progress Notes (Signed)
Care Coordination Note  05/14/2023 Name: [name] [name]. MRN: [id number] DOB: 05/27/1941  [name] [name]. is a 82 y.o. year old male who is a primary care patient of [name], MD and is actively engaged with the care management team. I reached out to [name] [name]. by phone today to assist with re-scheduling a follow up visit with the RN Case Manager  Follow up plan: Unsuccessful telephone outreach attempt made. A HIPAA compliant phone message was left for the patient providing contact information and requesting a return call.   Burman Nieves, CCMA Care Coordination Care Guide Direct Dial: [phone]

## 2023-05-15 ENCOUNTER — Telehealth: Payer: Self-pay | Admitting: Pulmonary Disease

## 2023-05-15 ENCOUNTER — Encounter: Payer: Self-pay | Admitting: Internal Medicine

## 2023-05-15 ENCOUNTER — Encounter: Payer: Self-pay | Admitting: Pulmonary Disease

## 2023-05-15 NOTE — Telephone Encounter (Signed)
Pt calling to speak to Dr. Isaiah Serge

## 2023-05-16 ENCOUNTER — Other Ambulatory Visit: Payer: Self-pay | Admitting: Internal Medicine

## 2023-05-16 ENCOUNTER — Encounter: Payer: Self-pay | Admitting: Pulmonary Disease

## 2023-05-16 DIAGNOSIS — L570 Actinic keratosis: Secondary | ICD-10-CM | POA: Diagnosis not present

## 2023-05-16 DIAGNOSIS — Z79899 Other long term (current) drug therapy: Secondary | ICD-10-CM | POA: Diagnosis not present

## 2023-05-16 DIAGNOSIS — L821 Other seborrheic keratosis: Secondary | ICD-10-CM | POA: Diagnosis not present

## 2023-05-16 DIAGNOSIS — I4819 Other persistent atrial fibrillation: Secondary | ICD-10-CM | POA: Diagnosis not present

## 2023-05-16 DIAGNOSIS — L814 Other melanin hyperpigmentation: Secondary | ICD-10-CM | POA: Diagnosis not present

## 2023-05-16 DIAGNOSIS — J431 Panlobular emphysema: Secondary | ICD-10-CM

## 2023-05-16 MED ORDER — PREDNISONE 10 MG PO TABS: ORAL_TABLET | ORAL | 0 refills | Status: DC

## 2023-05-16 NOTE — Telephone Encounter (Signed)
May 16, 2023 Chilton Greathouse, MD    05/16/23  9:47 AM Note I called and discussed with patient.  He still having short of breath His chest x-ray has not been officially read but by my read it appears to be stable with no infiltrate or acute pneumonia   He has finished a Z-Pak.  Will reorder a prednisone taper starting at 60 mg.  Nothing further needed.

## 2023-05-16 NOTE — Telephone Encounter (Signed)
I called and discussed with patient.  He still having short of breath His chest x-ray has not been officially read but by my read it appears to be stable with no infiltrate or acute pneumonia  He has finished a Z-Pak.  Will reorder a prednisone taper starting at 60 mg.  Nothing further needed.

## 2023-05-16 NOTE — Telephone Encounter (Signed)
Last filled 04-03-23 #30 Last OV 04-16-23 Next OV 07-15-23 [facility]

## 2023-05-17 ENCOUNTER — Other Ambulatory Visit: Payer: Self-pay

## 2023-05-17 ENCOUNTER — Encounter: Payer: Self-pay | Admitting: Pulmonary Disease

## 2023-05-17 MED ORDER — FUROSEMIDE 20 MG PO TABS: ORAL_TABLET | ORAL | 1 refills | Status: DC

## 2023-05-17 NOTE — Telephone Encounter (Signed)
Patient checking on Zpak medication. Would like before the weekend. Patient phone number is [phone].

## 2023-05-18 NOTE — Telephone Encounter (Signed)
Please refer to mychart message from 10/17.

## 2023-05-18 NOTE — Telephone Encounter (Signed)
Dr. Isaiah Serge, please look at [name] [name] message from pt. Pt has sent several other messages requesting to have a refill of the zpak. He states that he received the prednisone but was also hoping to have a refill of the zpak as well.

## 2023-05-21 ENCOUNTER — Telehealth: Payer: Self-pay | Admitting: Cardiovascular Disease

## 2023-05-21 NOTE — Telephone Encounter (Signed)
Patient stated he recently wore a heart monitor for 3 days and wants to get results.  Patient stated can leave voice or MyChart message.

## 2023-05-22 ENCOUNTER — Telehealth: Payer: Self-pay | Admitting: Cardiovascular Disease

## 2023-05-22 NOTE — Telephone Encounter (Signed)
Patient returned RN's call regarding test results.

## 2023-05-22 NOTE — Telephone Encounter (Signed)
Pt made aware of Suzann's below recommendations and verbalized understanding.   Sherie Don, NP  to [name] [name].      05/21/23  7:54 PM It's difficult to say whether the SVT is having a direct correlation with your decreased strength.  There is no limitations to exercise per se, though you may be limited in what you can do by your own physical limitations.    Typically the symptoms of SVT are a sensation of heart racing in chest, shortness of breath, chest discomfort, or fatigue.    Start checking blood pressure and keep [place] updated.        Thank you, [name] [name]

## 2023-05-24 DIAGNOSIS — J9621 Acute and chronic respiratory failure with hypoxia: Secondary | ICD-10-CM | POA: Diagnosis not present

## 2023-05-24 DIAGNOSIS — J431 Panlobular emphysema: Secondary | ICD-10-CM | POA: Diagnosis not present

## 2023-05-24 DIAGNOSIS — Z9981 Dependence on supplemental oxygen: Secondary | ICD-10-CM | POA: Diagnosis not present

## 2023-05-24 DIAGNOSIS — I2510 Atherosclerotic heart disease of native coronary artery without angina pectoris: Secondary | ICD-10-CM | POA: Diagnosis not present

## 2023-05-24 DIAGNOSIS — Z741 Need for assistance with personal care: Secondary | ICD-10-CM | POA: Diagnosis not present

## 2023-05-24 DIAGNOSIS — R278 Other lack of coordination: Secondary | ICD-10-CM | POA: Diagnosis not present

## 2023-05-24 DIAGNOSIS — J441 Chronic obstructive pulmonary disease with (acute) exacerbation: Secondary | ICD-10-CM | POA: Diagnosis not present

## 2023-05-24 DIAGNOSIS — R2689 Other abnormalities of gait and mobility: Secondary | ICD-10-CM | POA: Diagnosis not present

## 2023-05-24 DIAGNOSIS — M6281 Muscle weakness (generalized): Secondary | ICD-10-CM | POA: Diagnosis not present

## 2023-05-24 DIAGNOSIS — I4811 Longstanding persistent atrial fibrillation: Secondary | ICD-10-CM | POA: Diagnosis not present

## 2023-05-24 DIAGNOSIS — I4891 Unspecified atrial fibrillation: Secondary | ICD-10-CM | POA: Diagnosis not present

## 2023-05-24 NOTE — Progress Notes (Signed)
Care Coordination Note  05/24/2023 Name: [name] [name]. MRN: [id number] DOB: 1941-07-30  [name] [name]. is a 82 y.o. year old male who is a primary care patient of [name], MD and is actively engaged with the care management team. I reached out to [name] [name]. by phone today to assist with re-scheduling a follow up visit with the RN Case Manager  Follow up plan: Telephone appointment with care management team member scheduled for: 06/04/2023  Burman Nieves, [facility] Care Coordination Care Guide Direct Dial: [phone]

## 2023-05-27 DIAGNOSIS — I4891 Unspecified atrial fibrillation: Secondary | ICD-10-CM | POA: Diagnosis not present

## 2023-05-27 DIAGNOSIS — Z9981 Dependence on supplemental oxygen: Secondary | ICD-10-CM | POA: Diagnosis not present

## 2023-05-27 DIAGNOSIS — J431 Panlobular emphysema: Secondary | ICD-10-CM | POA: Diagnosis not present

## 2023-05-27 DIAGNOSIS — R2689 Other abnormalities of gait and mobility: Secondary | ICD-10-CM | POA: Diagnosis not present

## 2023-05-27 DIAGNOSIS — J9621 Acute and chronic respiratory failure with hypoxia: Secondary | ICD-10-CM | POA: Diagnosis not present

## 2023-05-27 DIAGNOSIS — J441 Chronic obstructive pulmonary disease with (acute) exacerbation: Secondary | ICD-10-CM | POA: Diagnosis not present

## 2023-05-27 DIAGNOSIS — R278 Other lack of coordination: Secondary | ICD-10-CM | POA: Diagnosis not present

## 2023-05-27 DIAGNOSIS — M6281 Muscle weakness (generalized): Secondary | ICD-10-CM | POA: Diagnosis not present

## 2023-05-27 DIAGNOSIS — Z741 Need for assistance with personal care: Secondary | ICD-10-CM | POA: Diagnosis not present

## 2023-05-27 DIAGNOSIS — I4811 Longstanding persistent atrial fibrillation: Secondary | ICD-10-CM | POA: Diagnosis not present

## 2023-05-27 DIAGNOSIS — I2510 Atherosclerotic heart disease of native coronary artery without angina pectoris: Secondary | ICD-10-CM | POA: Diagnosis not present

## 2023-05-29 ENCOUNTER — Telehealth: Payer: Self-pay | Admitting: Pulmonary Disease

## 2023-05-29 DIAGNOSIS — J441 Chronic obstructive pulmonary disease with (acute) exacerbation: Secondary | ICD-10-CM | POA: Diagnosis not present

## 2023-05-29 DIAGNOSIS — I4811 Longstanding persistent atrial fibrillation: Secondary | ICD-10-CM | POA: Diagnosis not present

## 2023-05-29 DIAGNOSIS — I4891 Unspecified atrial fibrillation: Secondary | ICD-10-CM | POA: Diagnosis not present

## 2023-05-29 DIAGNOSIS — R2689 Other abnormalities of gait and mobility: Secondary | ICD-10-CM | POA: Diagnosis not present

## 2023-05-29 DIAGNOSIS — Z9981 Dependence on supplemental oxygen: Secondary | ICD-10-CM | POA: Diagnosis not present

## 2023-05-29 DIAGNOSIS — Z741 Need for assistance with personal care: Secondary | ICD-10-CM | POA: Diagnosis not present

## 2023-05-29 DIAGNOSIS — M6281 Muscle weakness (generalized): Secondary | ICD-10-CM | POA: Diagnosis not present

## 2023-05-29 DIAGNOSIS — R278 Other lack of coordination: Secondary | ICD-10-CM | POA: Diagnosis not present

## 2023-05-29 DIAGNOSIS — J431 Panlobular emphysema: Secondary | ICD-10-CM | POA: Diagnosis not present

## 2023-05-29 DIAGNOSIS — I2510 Atherosclerotic heart disease of native coronary artery without angina pectoris: Secondary | ICD-10-CM | POA: Diagnosis not present

## 2023-05-29 DIAGNOSIS — J9621 Acute and chronic respiratory failure with hypoxia: Secondary | ICD-10-CM | POA: Diagnosis not present

## 2023-05-29 NOTE — Telephone Encounter (Signed)
Patient states that he missed a call from Dr. Delton Coombes.

## 2023-05-31 ENCOUNTER — Telehealth: Payer: Self-pay | Admitting: Internal Medicine

## 2023-05-31 ENCOUNTER — Inpatient Hospital Stay
Admission: EM | Admit: 2023-05-31 | Discharge: 2023-06-03 | DRG: 177 | Disposition: A | Discharge status: Home Health | Payer: Medicare Other | Attending: Internal Medicine | Admitting: Internal Medicine

## 2023-05-31 ENCOUNTER — Emergency Department: Payer: Medicare Other

## 2023-05-31 ENCOUNTER — Other Ambulatory Visit: Payer: Self-pay

## 2023-05-31 DIAGNOSIS — K219 Gastro-esophageal reflux disease without esophagitis: Secondary | ICD-10-CM | POA: Diagnosis present

## 2023-05-31 DIAGNOSIS — T380X5A Adverse effect of glucocorticoids and synthetic analogues, initial encounter: Secondary | ICD-10-CM | POA: Diagnosis not present

## 2023-05-31 DIAGNOSIS — Z87891 Personal history of nicotine dependence: Secondary | ICD-10-CM | POA: Diagnosis not present

## 2023-05-31 DIAGNOSIS — Z7901 Long term (current) use of anticoagulants: Secondary | ICD-10-CM | POA: Diagnosis not present

## 2023-05-31 DIAGNOSIS — Z79899 Other long term (current) drug therapy: Secondary | ICD-10-CM

## 2023-05-31 DIAGNOSIS — Z85828 Personal history of other malignant neoplasm of skin: Secondary | ICD-10-CM | POA: Diagnosis not present

## 2023-05-31 DIAGNOSIS — R6889 Other general symptoms and signs: Secondary | ICD-10-CM | POA: Diagnosis not present

## 2023-05-31 DIAGNOSIS — Z8249 Family history of ischemic heart disease and other diseases of the circulatory system: Secondary | ICD-10-CM

## 2023-05-31 DIAGNOSIS — I2510 Atherosclerotic heart disease of native coronary artery without angina pectoris: Secondary | ICD-10-CM | POA: Diagnosis not present

## 2023-05-31 DIAGNOSIS — D649 Anemia, unspecified: Secondary | ICD-10-CM

## 2023-05-31 DIAGNOSIS — R278 Other lack of coordination: Secondary | ICD-10-CM | POA: Diagnosis not present

## 2023-05-31 DIAGNOSIS — J441 Chronic obstructive pulmonary disease with (acute) exacerbation: Principal | ICD-10-CM | POA: Diagnosis present

## 2023-05-31 DIAGNOSIS — R739 Hyperglycemia, unspecified: Secondary | ICD-10-CM | POA: Diagnosis not present

## 2023-05-31 DIAGNOSIS — J690 Pneumonitis due to inhalation of food and vomit: Secondary | ICD-10-CM | POA: Diagnosis not present

## 2023-05-31 DIAGNOSIS — Z860100 Personal history of colon polyps, unspecified: Secondary | ICD-10-CM

## 2023-05-31 DIAGNOSIS — Z955 Presence of coronary angioplasty implant and graft: Secondary | ICD-10-CM

## 2023-05-31 DIAGNOSIS — N401 Enlarged prostate with lower urinary tract symptoms: Secondary | ICD-10-CM | POA: Diagnosis present

## 2023-05-31 DIAGNOSIS — Q6100 Congenital renal cyst, unspecified: Secondary | ICD-10-CM | POA: Diagnosis not present

## 2023-05-31 DIAGNOSIS — I739 Peripheral vascular disease, unspecified: Secondary | ICD-10-CM | POA: Diagnosis not present

## 2023-05-31 DIAGNOSIS — C61 Malignant neoplasm of prostate: Secondary | ICD-10-CM | POA: Diagnosis present

## 2023-05-31 DIAGNOSIS — Z1152 Encounter for screening for COVID-19: Secondary | ICD-10-CM | POA: Diagnosis not present

## 2023-05-31 DIAGNOSIS — I4819 Other persistent atrial fibrillation: Secondary | ICD-10-CM | POA: Diagnosis present

## 2023-05-31 DIAGNOSIS — R0602 Shortness of breath: Principal | ICD-10-CM

## 2023-05-31 DIAGNOSIS — Z7952 Long term (current) use of systemic steroids: Secondary | ICD-10-CM

## 2023-05-31 DIAGNOSIS — R918 Other nonspecific abnormal finding of lung field: Secondary | ICD-10-CM | POA: Diagnosis present

## 2023-05-31 DIAGNOSIS — J431 Panlobular emphysema: Secondary | ICD-10-CM | POA: Diagnosis not present

## 2023-05-31 DIAGNOSIS — Z9841 Cataract extraction status, right eye: Secondary | ICD-10-CM

## 2023-05-31 DIAGNOSIS — I4891 Unspecified atrial fibrillation: Secondary | ICD-10-CM | POA: Diagnosis not present

## 2023-05-31 DIAGNOSIS — R195 Other fecal abnormalities: Secondary | ICD-10-CM | POA: Diagnosis present

## 2023-05-31 DIAGNOSIS — R0689 Other abnormalities of breathing: Secondary | ICD-10-CM | POA: Diagnosis not present

## 2023-05-31 DIAGNOSIS — M51369 Other intervertebral disc degeneration, lumbar region without mention of lumbar back pain or lower extremity pain: Secondary | ICD-10-CM | POA: Diagnosis not present

## 2023-05-31 DIAGNOSIS — R000 Tachycardia, unspecified: Secondary | ICD-10-CM | POA: Diagnosis not present

## 2023-05-31 DIAGNOSIS — M5030 Other cervical disc degeneration, unspecified cervical region: Secondary | ICD-10-CM | POA: Diagnosis present

## 2023-05-31 DIAGNOSIS — Z9842 Cataract extraction status, left eye: Secondary | ICD-10-CM

## 2023-05-31 DIAGNOSIS — Z741 Need for assistance with personal care: Secondary | ICD-10-CM | POA: Diagnosis not present

## 2023-05-31 DIAGNOSIS — I10 Essential (primary) hypertension: Secondary | ICD-10-CM | POA: Diagnosis present

## 2023-05-31 DIAGNOSIS — N281 Cyst of kidney, acquired: Secondary | ICD-10-CM | POA: Diagnosis not present

## 2023-05-31 DIAGNOSIS — Z883 Allergy status to other anti-infective agents status: Secondary | ICD-10-CM

## 2023-05-31 DIAGNOSIS — Z8546 Personal history of malignant neoplasm of prostate: Secondary | ICD-10-CM

## 2023-05-31 DIAGNOSIS — J9621 Acute and chronic respiratory failure with hypoxia: Secondary | ICD-10-CM | POA: Diagnosis present

## 2023-05-31 DIAGNOSIS — I499 Cardiac arrhythmia, unspecified: Secondary | ICD-10-CM | POA: Diagnosis not present

## 2023-05-31 DIAGNOSIS — Z9981 Dependence on supplemental oxygen: Secondary | ICD-10-CM | POA: Diagnosis not present

## 2023-05-31 DIAGNOSIS — R2689 Other abnormalities of gait and mobility: Secondary | ICD-10-CM | POA: Diagnosis not present

## 2023-05-31 DIAGNOSIS — Z8616 Personal history of COVID-19: Secondary | ICD-10-CM | POA: Diagnosis not present

## 2023-05-31 DIAGNOSIS — I4811 Longstanding persistent atrial fibrillation: Secondary | ICD-10-CM | POA: Diagnosis not present

## 2023-05-31 DIAGNOSIS — M6281 Muscle weakness (generalized): Secondary | ICD-10-CM | POA: Diagnosis not present

## 2023-05-31 DIAGNOSIS — J439 Emphysema, unspecified: Secondary | ICD-10-CM | POA: Diagnosis not present

## 2023-05-31 LAB — BASIC METABOLIC PANEL
Anion gap: 10 (ref 5–15)
BUN: 37 mg/dL — ABNORMAL HIGH (ref 8–23)
CO2: 33 mmol/L — ABNORMAL HIGH (ref 22–32)
Calcium: 8.2 mg/dL — ABNORMAL LOW (ref 8.9–10.3)
Chloride: 97 mmol/L — ABNORMAL LOW (ref 98–111)
Creatinine, Ser: 0.96 mg/dL (ref 0.61–1.24)
GFR, Estimated: >60 mL/min (ref >60)
Glucose, Bld: 200 mg/dL — ABNORMAL HIGH (ref 70–99)
Potassium: 4.4 mmol/L (ref 3.5–5.1)
Sodium: 140 mmol/L (ref 135–145)

## 2023-05-31 LAB — CBC WITH DIFFERENTIAL/PLATELET
Abs Immature Granulocytes: 0.67 10*3/uL — ABNORMAL HIGH (ref 0.00–0.07)
Basophils Absolute: 0 10*3/uL (ref 0.0–0.1)
Basophils Relative: 0 %
Eosinophils Absolute: 0 10*3/uL (ref 0.0–0.5)
Eosinophils Relative: 0 %
HCT: 36.6 % — ABNORMAL LOW (ref 39.0–52.0)
Hemoglobin: 12.3 g/dL — ABNORMAL LOW (ref 13.0–17.0)
Immature Granulocytes: 5 %
Lymphocytes Relative: 4 %
Lymphs Abs: 0.5 10*3/uL — ABNORMAL LOW (ref 0.7–4.0)
MCH: 31.1 pg (ref 26.0–34.0)
MCHC: 33.6 g/dL (ref 30.0–36.0)
MCV: 92.4 fL (ref 80.0–100.0)
Monocytes Absolute: 0.2 10*3/uL (ref 0.1–1.0)
Monocytes Relative: 2 %
Neutro Abs: 11.9 10*3/uL — ABNORMAL HIGH (ref 1.7–7.7)
Neutrophils Relative %: 89 %
Platelets: 136 10*3/uL — ABNORMAL LOW (ref 150–400)
RBC: 3.96 MIL/uL — ABNORMAL LOW (ref 4.22–5.81)
RDW: 15.5 % (ref 11.5–15.5)
WBC: 13.3 10*3/uL — ABNORMAL HIGH (ref 4.0–10.5)
nRBC: 0 % (ref 0.0–0.2)

## 2023-05-31 LAB — RESP PANEL BY RT-PCR (RSV, FLU A&B, COVID)  RVPGX2
Influenza A by PCR: NEGATIVE
Influenza B by PCR: NEGATIVE
Resp Syncytial Virus by PCR: NEGATIVE
SARS Coronavirus 2 by RT PCR: NEGATIVE

## 2023-05-31 LAB — TROPONIN I (HIGH SENSITIVITY)
Troponin I (High Sensitivity): 18 ng/L — ABNORMAL HIGH (ref <18)
Troponin I (High Sensitivity): 17 ng/L (ref <18)

## 2023-05-31 LAB — BRAIN NATRIURETIC PEPTIDE: B Natriuretic Peptide: 60.4 pg/mL (ref 0.0–100.0)

## 2023-05-31 MED ORDER — IOHEXOL 350 MG/ML SOLN
75.0000 mL | Freq: Once | INTRAVENOUS | Status: AC | PRN
  Administered 2023-05-31: 75 mL via INTRAVENOUS

## 2023-05-31 MED ORDER — ACETAMINOPHEN 650 MG RE SUPP: 650.0000 mg | Freq: Four times a day (QID) | RECTAL | Status: DC | PRN

## 2023-05-31 MED ORDER — IPRATROPIUM-ALBUTEROL 0.5-2.5 (3) MG/3ML IN SOLN
3.0000 mL | Freq: Once | RESPIRATORY_TRACT | Status: AC
  Administered 2023-06-01: 3 mL via RESPIRATORY_TRACT
  Filled 2023-05-31: qty 3

## 2023-05-31 MED ORDER — ROSUVASTATIN CALCIUM 20 MG PO TABS
20.0000 mg | ORAL_TABLET | Freq: Every day | ORAL | Status: DC
  Administered 2023-06-01 – 2023-06-03 (×3): 20 mg via ORAL
  Filled 2023-05-31 (×3): qty 1

## 2023-05-31 MED ORDER — ACETAMINOPHEN 325 MG PO TABS: 650.0000 mg | ORAL_TABLET | Freq: Four times a day (QID) | ORAL | Status: DC | PRN

## 2023-05-31 MED ORDER — METHYLPREDNISOLONE SODIUM SUCC 40 MG IJ SOLR
40.0000 mg | Freq: Two times a day (BID) | INTRAMUSCULAR | Status: AC
  Administered 2023-06-01 (×2): 40 mg via INTRAVENOUS
  Filled 2023-05-31 (×2): qty 1

## 2023-05-31 MED ORDER — ONDANSETRON HCL 4 MG PO TABS: 4.0000 mg | ORAL_TABLET | Freq: Four times a day (QID) | ORAL | Status: DC | PRN

## 2023-05-31 MED ORDER — PREDNISONE 20 MG PO TABS
40.0000 mg | ORAL_TABLET | Freq: Every day | ORAL | Status: DC
  Administered 2023-06-02: 40 mg via ORAL
  Filled 2023-05-31: qty 2

## 2023-05-31 MED ORDER — NITROGLYCERIN 0.4 MG SL SUBL: 0.4000 mg | SUBLINGUAL_TABLET | SUBLINGUAL | Status: DC | PRN

## 2023-05-31 MED ORDER — HYDROCODONE-ACETAMINOPHEN 5-325 MG PO TABS
1.0000 | ORAL_TABLET | ORAL | Status: DC | PRN
Start: 2023-05-31 — End: 2023-06-03

## 2023-05-31 MED ORDER — GUAIFENESIN ER 600 MG PO TB12
600.0000 mg | ORAL_TABLET | Freq: Two times a day (BID) | ORAL | Status: DC
  Administered 2023-06-01 – 2023-06-03 (×6): 600 mg via ORAL
  Filled 2023-05-31 (×6): qty 1

## 2023-05-31 MED ORDER — IPRATROPIUM-ALBUTEROL 0.5-2.5 (3) MG/3ML IN SOLN
3.0000 mL | Freq: Once | RESPIRATORY_TRACT | Status: AC
  Administered 2023-05-31: 3 mL via RESPIRATORY_TRACT
  Filled 2023-05-31: qty 3

## 2023-05-31 MED ORDER — DILTIAZEM HCL ER COATED BEADS 120 MG PO CP24
120.0000 mg | ORAL_CAPSULE | Freq: Two times a day (BID) | ORAL | Status: DC
  Administered 2023-06-01 – 2023-06-03 (×5): 120 mg via ORAL
  Filled 2023-05-31 (×6): qty 1

## 2023-05-31 MED ORDER — FUROSEMIDE 20 MG PO TABS
20.0000 mg | ORAL_TABLET | Freq: Every day | ORAL | Status: DC
  Administered 2023-06-01 – 2023-06-03 (×3): 20 mg via ORAL
  Filled 2023-05-31 (×3): qty 1

## 2023-05-31 MED ORDER — FINASTERIDE 5 MG PO TABS
5.0000 mg | ORAL_TABLET | Freq: Every day | ORAL | Status: DC
  Administered 2023-06-01 – 2023-06-03 (×3): 5 mg via ORAL
  Filled 2023-05-31 (×3): qty 1

## 2023-05-31 MED ORDER — DOFETILIDE 250 MCG PO CAPS
250.0000 ug | ORAL_CAPSULE | Freq: Two times a day (BID) | ORAL | Status: DC
  Administered 2023-06-01 – 2023-06-03 (×4): 250 ug via ORAL
  Filled 2023-05-31 (×7): qty 1

## 2023-05-31 MED ORDER — ENOXAPARIN SODIUM 40 MG/0.4ML IJ SOSY
40.0000 mg | PREFILLED_SYRINGE | INTRAMUSCULAR | Status: DC
  Administered 2023-06-01 – 2023-06-02 (×2): 40 mg via SUBCUTANEOUS
  Filled 2023-05-31 (×2): qty 0.4

## 2023-05-31 MED ORDER — MORPHINE SULFATE (PF) 2 MG/ML IV SOLN: 2.0000 mg | INTRAVENOUS | Status: DC | PRN

## 2023-05-31 MED ORDER — ONDANSETRON HCL 4 MG/2ML IJ SOLN: 4.0000 mg | Freq: Four times a day (QID) | INTRAMUSCULAR | Status: DC | PRN

## 2023-05-31 NOTE — ED Provider Notes (Signed)
[facility] Provider Note    Event Date/Time   First MD Initiated Contact with Patient 05/31/23 1514     (approximate)   History   Shortness of Breath (SOB, at home pulse ox 80s)   HPI  [name] [name]. is a 82 y.o. male who presents to the emergency department today because of concerns for shortness of breath.  The patient states that he had COVID 2 months ago.  However since then he has not returned to his baseline breathing.  He does have history of COPD is on 3 L of oxygen normally but has recently had to go up to 4 L.  Over the past week his breathing has gotten increasingly worse.  This has been associated by chest tightness.  The patient states he is also had his normal COPD cough.  Denies any fevers or chills.  Has had some swelling in his ankles but this is his baseline.     Physical Exam   Triage Vital Signs: ED Triage Vitals  Encounter Vitals Group     BP --      Systolic BP Percentile --      Diastolic BP Percentile --      Pulse Rate 05/31/23 1450 93     Resp 05/31/23 1450 17     Temp 05/31/23 1450 97.9 F (36.6 C)     Temp src --      SpO2 05/31/23 1447 100 %     Weight 05/31/23 1452 159 lb (72.1 kg)     Height 05/31/23 1452 5' 10.5" (1.791 m)     Head Circumference --      Peak Flow --      Pain Score 05/31/23 1452 0     Pain Loc --      Pain Education --      Exclude from Growth Chart --     Most recent vital signs: Vitals:   05/31/23 1447 05/31/23 1450  Pulse:  93  Resp:  17  Temp:  97.9 F (36.6 C)  SpO2: 100%    General: Awake, alert, oriented. CV:  Good peripheral perfusion. Regular rate and rhythm. Resp:  Increased respiratory effort, poor air movement diffusely with expiratory wheezing Abd:  No distention.  Other:  Bilateral lower extremity pitting edema.    ED Results / Procedures / Treatments   Labs (all labs ordered are listed, but only abnormal results are displayed) Labs Reviewed  CBC WITH  DIFFERENTIAL/PLATELET - Abnormal; Notable for the following components:      Result Value   WBC 13.3 (*)    RBC 3.96 (*)    Hemoglobin 12.3 (*)    HCT 36.6 (*)    Platelets 136 (*)    Neutro Abs 11.9 (*)    Lymphs Abs 0.5 (*)    Abs Immature Granulocytes 0.67 (*)    All other components within normal limits  BASIC METABOLIC PANEL - Abnormal; Notable for the following components:   Chloride 97 (*)    CO2 33 (*)    Glucose, Bld 200 (*)    BUN 37 (*)    Calcium 8.2 (*)    All other components within normal limits  TROPONIN I (HIGH SENSITIVITY) - Abnormal; Notable for the following components:   Troponin I (High Sensitivity) 18 (*)    All other components within normal limits  RESP PANEL BY RT-PCR (RSV, FLU A&B, COVID)  RVPGX2  BRAIN NATRIURETIC PEPTIDE  VITAMIN B12  RETICULOCYTES  HEMOGLOBIN  HEMOGLOBIN  IRON AND TIBC  FERRITIN  FOLATE  CBC  CREATININE, SERUM  BASIC METABOLIC PANEL  TROPONIN I (HIGH SENSITIVITY)     EKG  I, Phineas Semen, attending physician, personally viewed and interpreted this EKG  EKG Time: 1538 Rate: 99 Rhythm: sinus rhythm with pac Axis: normal Intervals: qtc 459 QRS: narrow, q waves v1, v2 ST changes: no st elevation Impression: abnormal ekg    RADIOLOGY I independently interpreted and visualized the CXR. My interpretation: No pneumonia Radiology interpretation:  IMPRESSION:  Chronic hyperinflation, bronchial thickening, and emphysema. No  acute findings.      PROCEDURES:  Critical Care performed: No    MEDICATIONS ORDERED IN ED: Medications - No data to display   IMPRESSION / MDM / ASSESSMENT AND PLAN / ED COURSE  I reviewed the triage vital signs and the nursing notes.                              Differential diagnosis includes, but is not limited to, COPD, pneumonia, viral URI  Patient's presentation is most consistent with acute presentation with potential threat to life or bodily function.   The patient is  on the cardiac monitor to evaluate for evidence of arrhythmia and/or significant heart rate changes.  Patient presented to the emergency department today because of concerns for shortness of breath.  On exam patient has poor air movement with some expiratory wheezing.  Will obtain blood work and chest x-ray.  Will give further DuoNeb treatments here in the emergency department, he was given Solu-Medrol by EMS.  CXR without pneumonia. Blood work without significantly elevated troponin or BNP. At this time I think likely COPD exacerbation, however given history of recent covid will check CTA PE to evaluate for PE.   CTAPE negative for PE. Concerning for bronchitis. At this time given continued shortness of breath will plan on admission. Discussed with Dr. Para March with the hospitalist service who will evaluate for admission.     FINAL CLINICAL IMPRESSION(S) / ED DIAGNOSES   Final diagnoses:  Shortness of breath  COPD exacerbation (HCC)       Note:  This document was prepared using Dragon voice recognition software and may include unintentional dictation errors.    Phineas Semen, MD 05/31/23 2308

## 2023-05-31 NOTE — Assessment & Plan Note (Signed)
No acute issues CTA negative for PE

## 2023-05-31 NOTE — ED Notes (Signed)
Blood cultures x2, lactic acid, blue top and basics sent down to lab with Pt labels.

## 2023-05-31 NOTE — Telephone Encounter (Signed)
He has been struggling for a while now. I will await the ER evaluation

## 2023-05-31 NOTE — Assessment & Plan Note (Addendum)
Hemoglobin 12.3 noted down from prior 15 Guaiac positive in the ED--> patient on Eliquis--> no recent EGD colonoscopy on record Serial H&H and transfuse if necessary Hold Eliquis  Consult GI if downtrending

## 2023-05-31 NOTE — ED Triage Notes (Signed)
Arrives via [facility] reports SOB for 4 days Today low 80s for O2, 96% for EMS  Lung sounds dim and expiratory wheezes, 2 duonebs [facility]  #22 RAC, 125 mg Solumedorl  Rcent COVID sickness approx 3 weeks ago  MV:HQIO, 3L [place] @ home VitVals: Pulse: 80-120100% 142/74, RR: 22, Glucose 224

## 2023-05-31 NOTE — Assessment & Plan Note (Addendum)
Chronic anticoagulation Continue Tikosyn  Holding Eliquis due to positive guaiac and downtrending H&H from a month prior

## 2023-05-31 NOTE — Telephone Encounter (Signed)
FYI: This call has been transferred to Access Nurse. Once the result note has been entered staff can address the message at that time.  Patient called in with the following symptoms:  Red Word: Difficulty breathing, can't take deep breath or catch breath    Please advise at Mobile [phone] (mobile)  Message is routed to Provider Pool and [facility] Triage

## 2023-05-31 NOTE — Telephone Encounter (Signed)
I spoke with pt; pt said having problems getting his breath; pt is presently on 4.5 L of oxygen and pulse ox reading in 90s now but earlier o2 sats were in low 80s. Pt is still having difficulty in breathing; pt hesitates when speaking due to SOB now. No CP, lips not turning blue. Pt had Covid 1st of Sept 2024 and still having problems with breathing. Pt wants doctor to say needs to go to ED. I spoke with Dr Ermalene Searing and she advised with pts age, medical hx and now having problems breathing on oxygen advised pt should go to ED for eval. Pt voiced understanding and will go to ED at [facility]. Pt wants [facility] notified he is coming. I advised pt I would call [facility] ED and advise but pt will be triaged when gets to ED. Pt said there are people at the house with him but pt request I call 911 for him. I did call EMS and they have been dispatched; pt feels comfortable ending this call since people are with him. If pt condition worsens pt will call 911 to let them know. I spoke with Herbert Seta at [facility] ED triage and she is aware pt is coming by EMS. Sending note to Dr Alphonsus Sias who is out of office ans Dr Ermalene Searing who is in office.

## 2023-05-31 NOTE — Assessment & Plan Note (Signed)
Acute on chronic respiratory failure with hypoxia O2 sat was in the 80s with EMS on regular home flow rate of 2-3 and patient was tachypneic, wheezy DuoNebs, scheduled and as needed IV steroids Antitussives, flutter valve Supplemental O2 as needed

## 2023-05-31 NOTE — H&P (Incomplete)
History and Physical    Patient: [name] [name]. ZOX:[id number] DOB: 08/03/1940 DOA: 05/31/2023 DOS: the patient was seen and examined on 05/31/2023 PCP: [name], MD  Patient coming from: Home  Chief Complaint:  Chief Complaint  Patient presents with   Shortness of Breath    SOB, at home pulse ox 80s    HPI: [name] [name]. is a 82 y.o. 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  O2 sat was in the low 80s on his home flow rate.  Denies chest pain, fever or chills.ED course and data review: Tachycardic to 102 in the ED requiring 6 L to maintain sats in the high 90s to 100 Labs notable for WBC 13,000 negative respiratory viral panel and normal troponin and BNP.  Glucose 200 Hemoglobin of 12 slightly below baseline of 15 when last checked Guaiac pending  EKG, not done  CTA PE protocol negative for PE but showing stable pulmonary nodules bronchial wall thickening, all consistent with bronchitis IMPRESSION: 1. No evidence of pulmonary embolus. 2. Multiple bilateral solid pulmonary nodules, stable in size when compared with most recent prior, but decreased in size when compared with more remote priors, likely sequela of prior infection. 3. Mild bubbly debris seen in the trachea, findings can be seen in the setting of aspiration. 4. Mild bronchial wall thickening, findings can be seen in the setting of bronchitis. 5. Aortic Atherosclerosis (ICD10-I70.0) and Emphysema (ICD10-J43.9).    Patient treated with additional [facility] [place] Hospitalist consulted for admission.   Review of Systems: As mentioned in the history of present illness. All other systems reviewed and are negative.  Past Medical History:   Diagnosis Date   Abdominal discomfort 12/07/2020   Acquired trigger finger of right middle finger 02/04/2020   Acute prostatitis 04/08/2018   Alcohol abuse    Alcohol dependence (HCC) 12/07/2020   Anal or rectal pain 04/08/2018   Annual physical exam 12/07/2020   Anorectal disorder 12/07/2020   Arthritis    Atherosclerotic heart disease of native coronary artery without angina pectoris 04/08/2018   a. 03/2018 PCI: LM nl, LAD 68m, LCX nl, OM1 70p (2.75x8 Synergy DES), 35m (2.5x24 Synergy DES), RCA 25p, RPL1 50. EF 55-65%.   Atrial flutter (HCC)    a. 07/2022 in setting of hospitalization for resp illness/RSV. CHA2DS2VASc = 4-->eliquis; b. 08/2022 s/p DCCV (100J); c. 09/26/2022 Recurrent Aflutter.   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 02/2022 Carotid U/S: 1-39% bilat ICA stenoses.   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   Contracture of palmar fascia 12/07/2020   COPD (chronic obstructive pulmonary disease) (HCC)    Corn of toe 12/07/2020   Corns and callosities 04/09/2018   Coronary artery disease    2019 with stents   Coronary atherosclerosis 05/07/2018  Degenerative disc disease, 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   Degenerative disc disease, 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   Diarrhea 04/09/2018   Double vision with both eyes open 12/07/2020   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   Dysphagia 08/14/2018   Dyspnea    ED (erectile dysfunction)    ED (erectile dysfunction) of organic origin 12/07/2020   Elevated prostate specific antigen (PSA)  04/08/2018   Elevated PSA    Encounter for screening for other disorder 12/07/2020   Enlarged prostate without lower urinary tract symptoms (luts) 04/09/2018   Enthesopathy 12/07/2020   Essential (primary) hypertension 04/08/2018   Ex-smoker 04/09/2018   Exertional dyspnea 04/02/2018   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 07/2022 Echo: EF 65-70%, no rwma, mild LVH, nl RV fxn.   Hyperlipidemia    Hypertension    Hypoxemia 12/07/2020   Kidney cysts    Left knee pain 12/07/2020   Low back pain 04/08/2018   Male erectile dysfunction 04/09/2018   Mixed hyperlipidemia 04/08/2018   Nocturia more than twice per night 04/09/2018   Osteoarthritis of first carpometacarpal joint 04/08/2018   Osteoarthritis of knee 12/07/2020   Other long term (current) drug therapy 12/07/2020   Pain due to onychomycosis of toenail of left foot 07/20/2019   Pain in joint 04/09/2018   Pain in knee 04/09/2018   Pain in unspecified knee 12/07/2020   Pain of finger 04/09/2018   Palpitations    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HCC) 12/07/2020   Personal history of colonic polyps 12/07/2020   Pneumonia    Pneumothorax 04/12/2020   Polypharmacy 04/09/2018   Porokeratosis 01/13/2019   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HCC)    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   Pulmonary nodules 06/08/2016   Pure hypercholesterolemia 12/07/2020   Renal cyst 12/07/2020   Sleep disorder 04/08/2018   Status post bronchoscopy 04/12/2020   Tear of rotator cuff 04/09/2018   Tobacco user 12/07/2020   Uncomplicated alcohol dependence (HCC) 04/09/2018   Unspecified rotator cuff tear or rupture of unspecified shoulder, not specified as traumatic 12/07/2020   Unspecified tear of unspecified meniscus, current injury, unspecified  knee, initial encounter 12/07/2020   Visual disturbance 12/07/2020   Vitamin D deficiency 04/08/2018   Past Surgical History:  Procedure Laterality Date   BRONCHIAL BIOPSY  10/13/2019   Procedure: BRONCHIAL BIOPSIES;  Surgeon: Leslye Peer, MD;  Location: [facility] ENDOSCOPY;  Service: Pulmonary;;   BRONCHIAL BIOPSY  04/12/2020   Procedure: BRONCHIAL BIOPSIES;  Surgeon: Leslye Peer, MD;  Location: [facility] ENDOSCOPY;  Service: Pulmonary;;   BRONCHIAL BRUSHINGS  10/13/2019   Procedure: BRONCHIAL BRUSHINGS;  Surgeon: Leslye Peer, MD;  Location: [facility] ENDOSCOPY;  Service: Pulmonary;;   BRONCHIAL BRUSHINGS  04/12/2020   Procedure: BRONCHIAL BRUSHINGS;  Surgeon: Leslye Peer, MD;  Location: [facility] ENDOSCOPY;  Service: Pulmonary;;   BRONCHIAL NEEDLE ASPIRATION BIOPSY  10/13/2019   Procedure: BRONCHIAL NEEDLE ASPIRATION BIOPSIES;  Surgeon: Leslye Peer, MD;  Location: [facility] ENDOSCOPY;  Service: Pulmonary;;   BRONCHIAL NEEDLE ASPIRATION BIOPSY  04/12/2020   Procedure: BRONCHIAL NEEDLE ASPIRATION BIOPSIES;  Surgeon: Leslye Peer, MD;  Location: [facility] ENDOSCOPY;  Service: Pulmonary;;   BRONCHIAL WASHINGS  10/13/2019   Procedure: BRONCHIAL WASHINGS;  Surgeon: Leslye Peer, MD;  Location: [facility] ENDOSCOPY;  Service: Pulmonary;;   BRONCHIAL WASHINGS  04/12/2020   Procedure: BRONCHIAL  WASHINGS;  Surgeon: Leslye Peer, MD;  Location: [facility] ENDOSCOPY;  Service: Pulmonary;;   CARDIAC CATHETERIZATION  2002   50% RCA   CARDIOVERSION N/A 09/17/2022   Procedure: CARDIOVERSION;  Surgeon: Iran Ouch, MD;  Location: [facility] ORS;  Service: Cardiovascular;  Laterality: N/A;   CATARACT EXTRACTION, BILATERAL     CORONARY STENT INTERVENTION N/A 04/15/2018   Procedure: CORONARY STENT INTERVENTION;  Surgeon: Corky Crafts, MD;  Location: [facility] INVASIVE CV LAB;  Service: Cardiovascular;  Laterality: N/A;  om1   EYE SURGERY     KNEE ARTHROSCOPY     LEFT HEART CATH AND CORONARY ANGIOGRAPHY N/A 04/15/2018   Procedure: LEFT HEART  CATH AND CORONARY ANGIOGRAPHY;  Surgeon: Corky Crafts, MD;  Location: [facility] INVASIVE CV LAB;  Service: Cardiovascular;  Laterality: N/A;   MULTIPLE TOOTH EXTRACTIONS     TONSILLECTOMY     VIDEO BRONCHOSCOPY  04/12/2020   VIDEO BRONCHOSCOPY WITH ENDOBRONCHIAL NAVIGATION N/A 07/26/2016   Procedure: VIDEO BRONCHOSCOPY WITH ENDOBRONCHIAL NAVIGATION;  Surgeon: Leslye Peer, MD;  Location: [facility] OR;  Service: Thoracic;  Laterality: N/A;   VIDEO BRONCHOSCOPY WITH ENDOBRONCHIAL NAVIGATION N/A 10/13/2019   Procedure: [facility] AND VIDEO BRONCHOSCOPY WITH ENDOBRONCHIAL NAVIGATION;  Surgeon: Leslye Peer, MD;  Location: [facility] ENDOSCOPY;  Service: Pulmonary;  Laterality: N/A;   VIDEO BRONCHOSCOPY WITH ENDOBRONCHIAL NAVIGATION N/A 04/12/2020   Procedure: VIDEO BRONCHOSCOPY WITH ENDOBRONCHIAL NAVIGATION;  Surgeon: Leslye Peer, MD;  Location: [facility] ENDOSCOPY;  Service: Pulmonary;  Laterality: N/A;   Social History:  reports that he quit smoking about 2 years ago. His smoking use included cigarettes. He started smoking about 70 years ago. He has a 51 pack-year smoking history. He has never been exposed to tobacco smoke. He has never used smokeless tobacco. He reports that he does not drink alcohol and does not use drugs.  Allergies  Allergen Reactions   Flagyl [Metronidazole] Other (See Comments)    Other reaction(s): skin reaction    Family History  Problem Relation Age of Onset   Hypertension Mother    Alzheimer's disease Mother     Prior to Admission medications   Medication Sig Start Date End Date Taking? Authorizing Provider  acetaminophen (TYLENOL) 325 MG tablet Take 650 mg by mouth every 4 (four) hours as needed.    [provider]  albuterol (PROVENTIL) (2.5 MG/3ML) 0.083% nebulizer solution Take 2.5 mg by nebulization 2 (two) times daily.    [provider]  albuterol (VENTOLIN HFA) 108 (90 Base) MCG/ACT inhaler Inhale 2 puffs into the lungs 3 (three) times daily as needed for  wheezing or shortness of breath. 11/10/20   [provider]  apixaban (ELIQUIS) 5 MG TABS tablet Take 1 tablet (5 mg total) by mouth 2 (two) times daily. 01/07/23   [name], MD  Budeson-Glycopyrrol-Formoterol (BREZTRI AEROSPHERE) 160-9-4.8 MCG/ACT AERO Inhale 2 puffs into the lungs 2 (two) times daily. 12/26/22   Leslye Peer, MD  clonazePAM (KLONOPIN) 0.5 MG tablet TAKE 1 TABLET BY MOUTH AT BEDTIME. 05/16/23   [name], MD  diltiazem (CARDIZEM CD) 120 MG 24 hr capsule Take 1 capsule (120 mg total) by mouth 2 (two) times daily. 08/30/22   Furth, Cadence H, PA-C  Docusate Calcium (STOOL SOFTENER PO) Take 1 tablet by mouth every evening.    [provider]  dofetilide (TIKOSYN) 250 MCG capsule Take 1 capsule (250 mcg total) by mouth 2 (two) times daily. 03/21/23   Sherie Don, NP  finasteride (  PROSCAR) 5 MG tablet Take 5 mg by mouth daily.    [provider]  furosemide (LASIX) 20 MG tablet Take 2 tablets in the morning (40 mg) and 1 tablet in the evening (20 mg) daily. 05/17/23   Sherie Don, NP  gabapentin (NEURONTIN) 100 MG capsule Take 200 mg by mouth at bedtime. 07/26/21   [provider]  ipratropium (ATROVENT) 0.03 % nasal spray Place 2 sprays into both nostrils every 12 (twelve) hours. 10/19/22   Leslye Peer, MD  ipratropium-albuterol (DUONEB) 0.5-2.5 (3) MG/3ML SOLN INHALE 3 ML BY NEBULIZER EVERY 6 HOURS AS NEEDED 11/29/22   Byrum, Les Pou, MD  nitroGLYCERIN (NITROSTAT) 0.4 MG SL tablet Place 1 tablet (0.4 mg total) under the tongue every 5 (five) minutes as needed for chest pain. 07/13/21   Revankar, Aundra Dubin, MD  OXYGEN Inhale into the lungs. 2 liters upon exertion and 3 liters continuous at night.    [provider]  pantoprazole (PROTONIX) 40 MG tablet Take 1 tablet (40 mg total) by mouth daily. 03/13/23   [name], MD  polyethylene glycol (MIRALAX / GLYCOLAX) 17 g packet Take 17 g by mouth daily as needed for  moderate constipation.    [provider]  predniSONE (DELTASONE) 10 MG tablet Take 1 tablet (10 mg total) by mouth daily with breakfast. 02/04/23   [name], MD  predniSONE (DELTASONE) 10 MG tablet Take 6 tabs by mouth for 3 days, then 5 for 3 days, 4 for 3 days, 3 for 3 days, 2 for 3 days, 1 for 3 days and stop 05/16/23   Mannam, Praveen, MD  rosuvastatin (CRESTOR) 20 MG tablet Take 1 tablet (20 mg total) by mouth daily. 04/16/23   Sherie Don, NP  silver sulfADIAZINE (SILVADENE) 1 % cream Apply 1 Application topically daily. 09/20/22   [name], MD    Physical Exam: Vitals:   05/31/23 2000 05/31/23 2035 05/31/23 2042 05/31/23 2130  BP: (!) 148/63 132/64  (!) 142/64  Pulse: 87 87  86  Resp: (!) 23 18  17   Temp:   98 F (36.7 C)   TempSrc:   Oral   SpO2: 100% 100%  100%  Weight:      Height:       Physical Exam  Labs on Admission: I have personally reviewed following labs and imaging studies  CBC: Recent Labs  Lab 05/31/23 1530  WBC 13.3*  NEUTROABS 11.9*  HGB 12.3*  HCT 36.6*  MCV 92.4  PLT 136*   Basic Metabolic Panel: Recent Labs  Lab 05/31/23 1530  NA 140  K 4.4  CL 97*  CO2 33*  GLUCOSE 200*  BUN 37*  CREATININE 0.96  CALCIUM 8.2*   GFR: Estimated Creatinine Clearance: 60.5 mL/min (by C-G formula based on SCr of 0.96 mg/dL). Liver Function Tests: No results for input(s): "AST", "ALT", "ALKPHOS", "BILITOT", "PROT", "ALBUMIN" in the last 168 hours. No results for input(s): "LIPASE", "AMYLASE" in the last 168 hours. No results for input(s): "AMMONIA" in the last 168 hours. Coagulation Profile: No results for input(s): "INR", "PROTIME" in the last 168 hours. Cardiac Enzymes: No results for input(s): "CKTOTAL", "CKMB", "CKMBINDEX", "TROPONINI" in the last 168 hours. BNP (last 3 results) No results for input(s): "PROBNP" in the last 8760 hours. HbA1C: No results for input(s): "HGBA1C" in the last 72 hours. CBG: No results for  input(s): "GLUCAP" in the last 168 hours. Lipid Profile: No results for input(s): "CHOL", "HDL", "LDLCALC", "  TRIG", "CHOLHDL", "LDLDIRECT" in the last 72 hours. Thyroid Function Tests: No results for input(s): "TSH", "T4TOTAL", "FREET4", "T3FREE", "THYROIDAB" in the last 72 hours. Anemia Panel: No results for input(s): "VITAMINB12", "FOLATE", "FERRITIN", "TIBC", "IRON", "RETICCTPCT" in the last 72 hours. Urine analysis:    Component Value Date/Time   COLORURINE YELLOW 11/16/2009 1130   APPEARANCEUR CLEAR 11/16/2009 1130   LABSPEC 1.017 11/16/2009 1130   PHURINE 7.5 11/16/2009 1130   GLUCOSEU NEGATIVE 11/16/2009 1130   HGBUR NEGATIVE 11/16/2009 1130   BILIRUBINUR NEGATIVE 11/16/2009 1130   KETONESUR NEGATIVE 11/16/2009 1130   PROTEINUR NEGATIVE 11/16/2009 1130   UROBILINOGEN 0.2 11/16/2009 1130   NITRITE NEGATIVE 11/16/2009 1130   LEUKOCYTESUR  11/16/2009 1130    NEGATIVE MICROSCOPIC NOT DONE ON URINES WITH NEGATIVE PROTEIN, BLOOD, LEUKOCYTES, NITRITE, OR GLUCOSE <1000 mg/dL.    Radiological Exams on Admission: CT Angio Chest PE W and/or Wo Contrast  Result Date: 05/31/2023 CLINICAL DATA:  Shortness of breath for 4 days EXAM: CT ANGIOGRAPHY CHEST WITH CONTRAST TECHNIQUE: Multidetector CT imaging of the chest was performed using the standard protocol during bolus administration of intravenous contrast. Multiplanar CT image reconstructions and MIPs were obtained to evaluate the vascular anatomy. RADIATION DOSE REDUCTION: This exam was performed according to the departmental dose-optimization program which includes automated exposure control, adjustment of the mA and/or kV according to patient size and/or use of iterative reconstruction technique. CONTRAST:  75mL OMNIPAQUE IOHEXOL 350 MG/ML SOLN COMPARISON:  Chest CT dated November 07, 2022 FINDINGS: Cardiovascular: No evidence of pulmonary embolus. Normal heart size. Normal caliber thoracic aorta with moderate atherosclerotic disease.  Lipomatous hypertrophy of the intra-atrial septum. Severe coronary artery calcifications. Mediastinum/Nodes: Esophagus and thyroid are unremarkable. No enlarged lymph nodes seen in the chest. Lungs/Pleura: Mild bubbly debris seen in the trachea. Mild bronchial wall thickening. Severe emphysema. Bilateral solid pulmonary nodules are stable. No consolidation, pleural effusion or pneumothorax. Upper Abdomen: Exophytic right renal cysts, no specific follow-up imaging is necessary. No acute abnormality. Musculoskeletal: No chest wall abnormality. No acute or significant osseous findings. Review of the MIP images confirms the above findings. IMPRESSION: 1. No evidence of pulmonary embolus. 2. Multiple bilateral solid pulmonary nodules, stable in size when compared with most recent prior, but decreased in size when compared with more remote priors, likely sequela of prior infection. 3. Mild bubbly debris seen in the trachea, findings can be seen in the setting of aspiration. 4. Mild bronchial wall thickening, findings can be seen in the setting of bronchitis. 5. Aortic Atherosclerosis (ICD10-I70.0) and Emphysema (ICD10-J43.9). Electronically Signed   By: Allegra Lai M.D.   On: 05/31/2023 21:46   DG Chest Portable 1 View  Result Date: 05/31/2023 CLINICAL DATA:  Shortness of breath for 4 days. EXAM: PORTABLE CHEST 1 VIEW COMPARISON:  05/08/2023 FINDINGS: Chronic hyperinflation. Chronic bronchial thickening and emphysema. Blunting of the costophrenic angles is unchanged and likely related to hyperinflation. No evidence of acute airspace disease. Normal heart size with stable mediastinal contours. No pulmonary edema. No pneumothorax. No acute osseous abnormalities are seen. IMPRESSION: Chronic hyperinflation, bronchial thickening, and emphysema. No acute findings. Electronically Signed   By: Narda Rutherford M.D.   On: 05/31/2023 16:49     Data Reviewed: Relevant notes from primary care and specialist visits, past  discharge summaries as available in EHR, including Care Everywhere. Prior diagnostic testing as pertinent to current admission diagnoses Updated medications and problem lists for reconciliation ED course, including vitals, labs, imaging, treatment and response to treatment Triage notes,  nursing and pharmacy notes and ED provider's notes Notable results as noted in HPI   Assessment and Plan: COPD with acute exacerbation (HCC) Acute on chronic respiratory failure with hypoxia O2 sat was in the 80s with EMS on regular home flow rate of 2-3 and patient was tachypneic, wheezy DuoNebs, scheduled and as needed IV steroids Antitussives, flutter valve Supplemental O2 as needed  Anemia, unspecified Hemoglobin 12.3 noted down from prior 15 Guaiac positive in the ED--> patient on Eliquis--> no recent EGD colonoscopy on record Serial H&H and transfuse if necessary Hold Eliquis  Consult GI if downtrending  Hyperglycemia Blood sugar noted to be 200 with no prior history of diabetes Will check blood sugar in the a.m. and get A1c  Persistent atrial fibrillation (HCC) Chronic anticoagulation Continue Tikosyn  Holding Eliquis due to positive guaiac and downtrending H&H from a month prior  Coronary artery disease No complaints of chest pain.  Troponin normal.  Awaiting EKG  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HCC) No acute issues CTA negative for PE  Essential (primary) hypertension BP controlled Continue home diltiazem, furosemide  Pulmonary nodules No acute issues    DVT prophylaxis: SCD  Consults: none  Advance Care Planning:   Code Status: Prior   Family Communication: none  Disposition Plan: Back to previous home environment  Severity of Illness: The appropriate patient status for this patient is INPATIENT. Inpatient status is judged to be reasonable and necessary in order to provide the required intensity of service to ensure the patient's safety. The patient's presenting symptoms,  physical exam findings, and initial radiographic and laboratory data in the context of their chronic comorbidities is felt to place them at high risk for further clinical deterioration. Furthermore, it is not anticipated that the patient will be medically stable for discharge from the hospital within 2 midnights of admission.   * I certify that at the point of admission it is my clinical judgment that the patient will require inpatient hospital care spanning beyond 2 midnights from the point of admission due to high intensity of service, high risk for further deterioration and high frequency of surveillance required.*  Author: Andris Baumann, MD 05/31/2023 10:40 PM  For on call review [email or web address].

## 2023-05-31 NOTE — Assessment & Plan Note (Signed)
No acute issues.

## 2023-05-31 NOTE — Assessment & Plan Note (Signed)
No complaints of chest pain.  Troponin normal.  Awaiting EKG

## 2023-05-31 NOTE — Assessment & Plan Note (Signed)
BP controlled Continue home diltiazem, furosemide

## 2023-05-31 NOTE — Assessment & Plan Note (Signed)
Possibly related to steroids.  Patient states he was on 2 courses of prednisone Blood sugar noted to be 200 with no prior history of diabetes Will check blood sugar in the a.m. and get A1c

## 2023-06-01 DIAGNOSIS — J441 Chronic obstructive pulmonary disease with (acute) exacerbation: Secondary | ICD-10-CM

## 2023-06-01 LAB — FOLATE: Folate: 32 ng/mL (ref >5.9)

## 2023-06-01 LAB — IRON AND TIBC
Iron: 31 ug/dL — ABNORMAL LOW (ref 45–182)
Saturation Ratios: 13 % — ABNORMAL LOW (ref 17.9–39.5)
TIBC: 231 ug/dL — ABNORMAL LOW (ref 250–450)
UIBC: 200 ug/dL

## 2023-06-01 LAB — BASIC METABOLIC PANEL
Anion gap: 8 (ref 5–15)
BUN: 27 mg/dL — ABNORMAL HIGH (ref 8–23)
CO2: 32 mmol/L (ref 22–32)
Calcium: 8.1 mg/dL — ABNORMAL LOW (ref 8.9–10.3)
Chloride: 97 mmol/L — ABNORMAL LOW (ref 98–111)
Creatinine, Ser: 0.66 mg/dL (ref 0.61–1.24)
GFR, Estimated: >60 mL/min (ref >60)
Glucose, Bld: 190 mg/dL — ABNORMAL HIGH (ref 70–99)
Potassium: 4.2 mmol/L (ref 3.5–5.1)
Sodium: 137 mmol/L (ref 135–145)

## 2023-06-01 LAB — CBC
HCT: 33 % — ABNORMAL LOW (ref 39.0–52.0)
Hemoglobin: 11.2 g/dL — ABNORMAL LOW (ref 13.0–17.0)
MCH: 30.8 pg (ref 26.0–34.0)
MCHC: 33.9 g/dL (ref 30.0–36.0)
MCV: 90.7 fL (ref 80.0–100.0)
Platelets: 140 10*3/uL — ABNORMAL LOW (ref 150–400)
RBC: 3.64 MIL/uL — ABNORMAL LOW (ref 4.22–5.81)
RDW: 15 % (ref 11.5–15.5)
WBC: 8.3 10*3/uL (ref 4.0–10.5)
nRBC: 0 % (ref 0.0–0.2)

## 2023-06-01 LAB — RETICULOCYTES
Immature Retic Fract: 24.8 % — ABNORMAL HIGH (ref 2.3–15.9)
RBC.: 3.57 MIL/uL — ABNORMAL LOW (ref 4.22–5.81)
Retic Count, Absolute: 92.8 10*3/uL (ref 19.0–186.0)
Retic Ct Pct: 2.6 % (ref 0.4–3.1)

## 2023-06-01 LAB — FERRITIN: Ferritin: 222 ng/mL (ref 24–336)

## 2023-06-01 LAB — VITAMIN B12: Vitamin B-12: 655 pg/mL (ref 180–914)

## 2023-06-01 MED ORDER — SODIUM CHLORIDE 0.9 % IV SOLN
2.0000 g | INTRAVENOUS | Status: DC
  Administered 2023-06-01 – 2023-06-03 (×3): 2 g via INTRAVENOUS
  Filled 2023-06-01 (×3): qty 20

## 2023-06-01 MED ORDER — UMECLIDINIUM BROMIDE 62.5 MCG/ACT IN AEPB
1.0000 | INHALATION_SPRAY | Freq: Every day | RESPIRATORY_TRACT | Status: DC
  Filled 2023-06-01: qty 7

## 2023-06-01 MED ORDER — IPRATROPIUM-ALBUTEROL 0.5-2.5 (3) MG/3ML IN SOLN
3.0000 mL | Freq: Four times a day (QID) | RESPIRATORY_TRACT | Status: DC | PRN
  Administered 2023-06-01 – 2023-06-02 (×2): 3 mL via RESPIRATORY_TRACT
  Filled 2023-06-01 (×3): qty 3

## 2023-06-01 MED ORDER — MOMETASONE FURO-FORMOTEROL FUM 200-5 MCG/ACT IN AERO
2.0000 | INHALATION_SPRAY | Freq: Two times a day (BID) | RESPIRATORY_TRACT | Status: DC
  Administered 2023-06-02 – 2023-06-03 (×3): 2 via RESPIRATORY_TRACT
  Filled 2023-06-01: qty 8.8

## 2023-06-01 MED ORDER — AZITHROMYCIN 500 MG PO TABS
500.0000 mg | ORAL_TABLET | Freq: Every day | ORAL | Status: DC
  Administered 2023-06-02 – 2023-06-03 (×2): 500 mg via ORAL
  Filled 2023-06-01 (×2): qty 1

## 2023-06-01 MED ORDER — DEXTROSE 5 % IV SOLN
500.0000 mg | INTRAVENOUS | Status: DC
  Administered 2023-06-01: 500 mg via INTRAVENOUS
  Filled 2023-06-01: qty 5

## 2023-06-01 NOTE — Progress Notes (Signed)
PROGRESS NOTE    [name] [name].  KZS:[id number] DOB: August 23, 1940 DOA: 05/31/2023 PCP: [name], MD   Assessment & Plan:   Active Problems:   Acute on chronic respiratory failure with hypoxia (HCC)   COPD with acute exacerbation (HCC)   Aspiration pneumonia (HCC)   Chronic anticoagulation   Persistent atrial fibrillation (HCC)   Anemia, unspecified   Hyperglycemia   Coronary artery disease   Pulmonary nodules   Essential (primary) hypertension   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HCC)   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HCC)  Assessment and Plan: COPD exacerbation: continue on azithromycin, bronchodilators, steroids, encourage incentive spirometry. Continue on supplemental oxygen and wean back to baseline   Acute on chronic hypoxic respiratory failure: continue on supplemental oxygen and wean back to baseline as tolerated   Aspiration pneumonia: continue on IV rocephin, azithromycin, bronchodilators & encourage incentive spirometry. NPO. Speech recommended a regular diet    Hyperglycemia: no hx of DM. Likely secondary to recent steroid use (prednisone).    IDA: will start iron supplement    Persistent a. fib: continue on home dose of diltiazem, tikosyn. Holding eliquis from positive guaiac & downtrending H&H    Hx of CAD: no chest pain currently. Continue on statin  Hx of prostate cancer: management per onco outpatient    HTN: continue on diltiazem, lasix        DVT prophylaxis: lovenox Code Status: full Family Communication: Disposition Plan: depends on PT/OT recs  Level of care: Telemetry Medical Status is: Inpatient Remains inpatient appropriate because: severity of illness    Consultants:    Procedures:   Antimicrobials:   Subjective: Pt c/o shortness of breath   Objective: Vitals:   06/01/23 0400 06/01/23 0430 06/01/23 0500 06/01/23 0600  BP: (!) 155/80 (!) 159/81 (!) 151/77 (!) 146/72  Pulse: 85 81 68 (!) 105  Resp: 15 16 17 12   Temp:    99 F  (37.2 C)  TempSrc:    Oral  SpO2: 100% 100%  100%  Weight:      Height:        Intake/Output Summary (Last 24 hours) at 06/01/2023 0839 Last data filed at 06/01/2023 7322 Gross per 24 hour  Intake 100 ml  Output --  Net 100 ml   Filed Weights   05/31/23 1452  Weight: 72.1 kg    Examination:  General exam: Appears calm and comfortable  Respiratory system: decreased breath sounds  Cardiovascular system: irregularly irregular. No rubs, gallops or clicks.  Gastrointestinal system: Abdomen is nondistended, soft and nontender.Normal bowel sounds heard. Central nervous system: Alert and awake Psychiatry: Judgement and insight appears not at baseline. Flat mood and affect     Data Reviewed: I have personally reviewed following labs and imaging studies  CBC: Recent Labs  Lab 05/31/23 1530 06/01/23 0730  WBC 13.3* 8.3  NEUTROABS 11.9*  --   HGB 12.3* 11.2*  HCT 36.6* 33.0*  MCV 92.4 90.7  PLT 136* 140*   Basic Metabolic Panel: Recent Labs  Lab 05/31/23 1530 06/01/23 0730  NA 140 137  K 4.4 4.2  CL 97* 97*  CO2 33* 32  GLUCOSE 200* 190*  BUN 37* 27*  CREATININE 0.96 0.66  CALCIUM 8.2* 8.1*   GFR: Estimated Creatinine Clearance: 72.6 mL/min (by C-G formula based on SCr of 0.66 mg/dL). Liver Function Tests: No results for input(s): "AST", "ALT", "ALKPHOS", "BILITOT", "PROT", "ALBUMIN" in the last 168 hours. No results for input(s): "LIPASE", "AMYLASE" in the last 168 hours. No  results for input(s): "AMMONIA" in the last 168 hours. Coagulation Profile: No results for input(s): "INR", "PROTIME" in the last 168 hours. Cardiac Enzymes: No results for input(s): "CKTOTAL", "CKMB", "CKMBINDEX", "TROPONINI" in the last 168 hours. BNP (last 3 results) No results for input(s): "PROBNP" in the last 8760 hours. HbA1C: No results for input(s): "HGBA1C" in the last 72 hours. CBG: No results for input(s): "GLUCAP" in the last 168 hours. Lipid Profile: No results for  input(s): "CHOL", "HDL", "LDLCALC", "TRIG", "CHOLHDL", "LDLDIRECT" in the last 72 hours. Thyroid Function Tests: No results for input(s): "TSH", "T4TOTAL", "FREET4", "T3FREE", "THYROIDAB" in the last 72 hours. Anemia Panel: Recent Labs    05/31/23 1852 06/01/23 0730  FOLATE 32.0  --   FERRITIN 222  --   TIBC 231*  --   IRON 31*  --   RETICCTPCT  --  2.6   Sepsis Labs: No results for input(s): "PROCALCITON", "LATICACIDVEN" in the last 168 hours.  Recent Results (from the past 240 hour(s))  Resp panel by RT-PCR (RSV, Flu A&B, Covid) Anterior Nasal Swab     Status: None   Collection Time: 05/31/23  4:29 PM   Specimen: Anterior Nasal Swab  Result Value Ref Range Status   SARS Coronavirus 2 by RT PCR NEGATIVE NEGATIVE Final    Comment: (NOTE) SARS-CoV-2 target nucleic acids are NOT DETECTED.  The SARS-CoV-2 RNA is generally detectable in upper respiratory specimens during the acute phase of infection. The lowest concentration of SARS-CoV-2 viral copies this assay can detect is 138 copies/mL. A negative result does not preclude SARS-Cov-2 infection and should not be used as the sole basis for treatment or other patient management decisions. A negative result may occur with  improper specimen collection/handling, submission of specimen other than nasopharyngeal swab, presence of viral mutation(s) within the areas targeted by this assay, and inadequate number of viral copies(<138 copies/mL). A negative result must be combined with clinical observations, patient history, and epidemiological information. The expected result is Negative.  Fact Sheet for Patients:  BloggerCourse.com  Fact Sheet for Healthcare Providers:  SeriousBroker.it  This test is no t yet approved or cleared by the [place] FDA and  has been authorized for detection and/or diagnosis of SARS-CoV-2 by FDA under an Emergency Use Authorization (EUA). This EUA  will remain  in effect (meaning this test can be used) for the duration of the COVID-19 declaration under Section 564(b)(1) of the Act, 21 U.S.C.section 360bbb-3(b)(1), unless the authorization is terminated  or revoked sooner.       Influenza A by PCR NEGATIVE NEGATIVE Final   Influenza B by PCR NEGATIVE NEGATIVE Final    Comment: (NOTE) The Xpert Xpress SARS-CoV-2/FLU/RSV plus assay is intended as an aid in the diagnosis of influenza from Nasopharyngeal swab specimens and should not be used as a sole basis for treatment. Nasal washings and aspirates are unacceptable for Xpert Xpress SARS-CoV-2/FLU/RSV testing.  Fact Sheet for Patients: BloggerCourse.com  Fact Sheet for Healthcare Providers: SeriousBroker.it  This test is not yet approved or cleared by the [place] FDA and has been authorized for detection and/or diagnosis of SARS-CoV-2 by FDA under an Emergency Use Authorization (EUA). This EUA will remain in effect (meaning this test can be used) for the duration of the COVID-19 declaration under Section 564(b)(1) of the Act, 21 U.S.C. section 360bbb-3(b)(1), unless the authorization is terminated or revoked.     Resp Syncytial Virus by PCR NEGATIVE NEGATIVE Final    Comment: (NOTE) Fact Sheet for  Patients: BloggerCourse.com  Fact Sheet for Healthcare Providers: SeriousBroker.it  This test is not yet approved or cleared by the [place] FDA and has been authorized for detection and/or diagnosis of SARS-CoV-2 by FDA under an Emergency Use Authorization (EUA). This EUA will remain in effect (meaning this test can be used) for the duration of the COVID-19 declaration under Section 564(b)(1) of the Act, 21 U.S.C. section 360bbb-3(b)(1), unless the authorization is terminated or revoked.  Performed at [facility], [address] [place], [place]  [address]          Radiology Studies: CT Angio Chest PE W and/or Wo Contrast  Result Date: 05/31/2023 CLINICAL DATA:  Shortness of breath for 4 days EXAM: CT ANGIOGRAPHY CHEST WITH CONTRAST TECHNIQUE: Multidetector CT imaging of the chest was performed using the standard protocol during bolus administration of intravenous contrast. Multiplanar CT image reconstructions and MIPs were obtained to evaluate the vascular anatomy. RADIATION DOSE REDUCTION: This exam was performed according to the departmental dose-optimization program which includes automated exposure control, adjustment of the mA and/or kV according to patient size and/or use of iterative reconstruction technique. CONTRAST:  75mL OMNIPAQUE IOHEXOL 350 MG/ML SOLN COMPARISON:  Chest CT dated November 07, 2022 FINDINGS: Cardiovascular: No evidence of pulmonary embolus. Normal heart size. Normal caliber thoracic aorta with moderate atherosclerotic disease. Lipomatous hypertrophy of the intra-atrial septum. Severe coronary artery calcifications. Mediastinum/Nodes: Esophagus and thyroid are unremarkable. No enlarged lymph nodes seen in the chest. Lungs/Pleura: Mild bubbly debris seen in the trachea. Mild bronchial wall thickening. Severe emphysema. Bilateral solid pulmonary nodules are stable. No consolidation, pleural effusion or pneumothorax. Upper Abdomen: Exophytic right renal cysts, no specific follow-up imaging is necessary. No acute abnormality. Musculoskeletal: No chest wall abnormality. No acute or significant osseous findings. Review of the MIP images confirms the above findings. IMPRESSION: 1. No evidence of pulmonary embolus. 2. Multiple bilateral solid pulmonary nodules, stable in size when compared with most recent prior, but decreased in size when compared with more remote priors, likely sequela of prior infection. 3. Mild bubbly debris seen in the trachea, findings can be seen in the setting of aspiration. 4. Mild bronchial wall thickening,  findings can be seen in the setting of bronchitis. 5. Aortic Atherosclerosis (ICD10-I70.0) and Emphysema (ICD10-J43.9). Electronically Signed   By: Allegra Lai M.D.   On: 05/31/2023 21:46   DG Chest Portable 1 View  Result Date: 05/31/2023 CLINICAL DATA:  Shortness of breath for 4 days. EXAM: PORTABLE CHEST 1 VIEW COMPARISON:  05/08/2023 FINDINGS: Chronic hyperinflation. Chronic bronchial thickening and emphysema. Blunting of the costophrenic angles is unchanged and likely related to hyperinflation. No evidence of acute airspace disease. Normal heart size with stable mediastinal contours. No pulmonary edema. No pneumothorax. No acute osseous abnormalities are seen. IMPRESSION: Chronic hyperinflation, bronchial thickening, and emphysema. No acute findings. Electronically Signed   By: Narda Rutherford M.D.   On: 05/31/2023 16:49        Scheduled Meds:  diltiazem  120 mg Oral BID   dofetilide  250 mcg Oral BID   enoxaparin (LOVENOX) injection  40 mg Subcutaneous Q24H   finasteride  5 mg Oral Daily   furosemide  20 mg Oral Daily   guaiFENesin  600 mg Oral BID   methylPREDNISolone acetate  120 mg Intramuscular Once   methylPREDNISolone (SOLU-MEDROL) injection  40 mg Intravenous Q12H   Followed by   Melene Muller ON 06/02/2023] predniSONE  40 mg Oral Q breakfast   rosuvastatin  20 mg Oral  Daily   Continuous Infusions:  azithromycin 500 mg (06/01/23 1610)   cefTRIAXone (ROCEPHIN)  IV Stopped (06/01/23 9604)     LOS: 1 day      Charise Killian, MD [facility] Pager [phone]  If 7PM-7AM, please contact night-coverage [email or web address]  06/01/2023, 8:39 AM

## 2023-06-01 NOTE — Telephone Encounter (Signed)
Checked pt's chart and saw that he was currently admitted at the hospital. Routing to Dr. Isaiah Serge as an Lorain Childes.

## 2023-06-01 NOTE — Assessment & Plan Note (Signed)
Patient has a congested cough and CT showing "mild bubbly debris seen in the trachea, findings can be seen in the setting of aspiration" Will keep n.p.o. Rocephin and azithromycin Speech therapy consult Aspiration precautions

## 2023-06-01 NOTE — Evaluation (Signed)
Clinical/Bedside Swallow Evaluation Patient Details  Name: [name] [name]. MRN: [id number] Date of Birth: 07-Sep-1940  Today's Date: 06/01/2023 Time: SLP Start Time (ACUTE ONLY): 1040 SLP Stop Time (ACUTE ONLY): 1057 SLP Time Calculation (min) (ACUTE ONLY): 17 min  Past Medical History:  Past Medical History:  Diagnosis Date   Abdominal discomfort 12/07/2020   Acquired trigger finger of right middle finger 02/04/2020   Acute prostatitis 04/08/2018   Alcohol abuse    Alcohol dependence (HCC) 12/07/2020   Anal or rectal pain 04/08/2018   Annual physical exam 12/07/2020   Anorectal disorder 12/07/2020   Arthritis    Atherosclerotic heart disease of native coronary artery without angina pectoris 04/08/2018   a. 03/2018 PCI: LM nl, LAD 58m, LCX nl, OM1 70p (2.75x8 Synergy DES), 50m (2.5x24 Synergy DES), RCA 25p, RPL1 50. EF 55-65%.   Atrial flutter (HCC)    a. 07/2022 in setting of hospitalization for resp illness/RSV. CHA2DS2VASc = 4-->eliquis; b. 08/2022 s/p DCCV (100J); c. 09/26/2022 Recurrent Aflutter.   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 02/2022 Carotid U/S: 1-39% bilat ICA stenoses.   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   Contracture of palmar fascia 12/07/2020   COPD (chronic obstructive pulmonary disease) (HCC)    Corn of toe 12/07/2020   Corns and callosities 04/09/2018   Coronary artery disease    2019 with stents   Coronary atherosclerosis 05/07/2018   Degenerative disc disease, 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    Degenerative disc disease, 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   Diarrhea 04/09/2018   Double vision with both eyes open 12/07/2020   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   Dysphagia 08/14/2018   Dyspnea    ED (erectile dysfunction)    ED (erectile dysfunction) of organic origin 12/07/2020   Elevated prostate specific antigen (PSA) 04/08/2018   Elevated PSA    Encounter for screening for other disorder 12/07/2020   Enlarged prostate without lower urinary tract symptoms (luts) 04/09/2018   Enthesopathy 12/07/2020   Essential (primary) hypertension 04/08/2018   Ex-smoker 04/09/2018   Exertional dyspnea 04/02/2018   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 07/2022 Echo: EF 65-70%, no rwma, mild LVH, nl RV fxn.   Hyperlipidemia    Hypertension    Hypoxemia 12/07/2020   Kidney cysts    Left knee pain 12/07/2020   Low back pain 04/08/2018   Male erectile dysfunction 04/09/2018   Mixed hyperlipidemia 04/08/2018   Nocturia more than twice per night 04/09/2018   Osteoarthritis of first carpometacarpal joint 04/08/2018   Osteoarthritis of knee 12/07/2020   Other long term (current) drug therapy 12/07/2020   Pain due to onychomycosis of toenail of left foot 07/20/2019   Pain in joint 04/09/2018   Pain in knee 04/09/2018   Pain in unspecified knee 12/07/2020   Pain of finger 04/09/2018   Palpitations    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HCC)  12/07/2020   Personal history of colonic polyps 12/07/2020   Pneumonia    Pneumothorax 04/12/2020   Polypharmacy 04/09/2018   Porokeratosis 01/13/2019   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HCC)    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   Pulmonary nodules 06/08/2016   Pure hypercholesterolemia 12/07/2020   Renal cyst 12/07/2020   Sleep disorder 04/08/2018   Status  post bronchoscopy 04/12/2020   Tear of rotator cuff 04/09/2018   Tobacco user 12/07/2020   Uncomplicated alcohol dependence (HCC) 04/09/2018   Unspecified rotator cuff tear or rupture of unspecified shoulder, not specified as traumatic 12/07/2020   Unspecified tear of unspecified meniscus, current injury, unspecified knee, initial encounter 12/07/2020   Visual disturbance 12/07/2020   Vitamin D deficiency 04/08/2018   Past Surgical History:  Past Surgical History:  Procedure Laterality Date   BRONCHIAL BIOPSY  10/13/2019   Procedure: BRONCHIAL BIOPSIES;  Surgeon: Leslye Peer, MD;  Location: [facility] ENDOSCOPY;  Service: Pulmonary;;   BRONCHIAL BIOPSY  04/12/2020   Procedure: BRONCHIAL BIOPSIES;  Surgeon: Leslye Peer, MD;  Location: [facility] ENDOSCOPY;  Service: Pulmonary;;   BRONCHIAL BRUSHINGS  10/13/2019   Procedure: BRONCHIAL BRUSHINGS;  Surgeon: Leslye Peer, MD;  Location: [facility] ENDOSCOPY;  Service: Pulmonary;;   BRONCHIAL BRUSHINGS  04/12/2020   Procedure: BRONCHIAL BRUSHINGS;  Surgeon: Leslye Peer, MD;  Location: [facility] ENDOSCOPY;  Service: Pulmonary;;   BRONCHIAL NEEDLE ASPIRATION BIOPSY  10/13/2019   Procedure: BRONCHIAL NEEDLE ASPIRATION BIOPSIES;  Surgeon: Leslye Peer, MD;  Location: [facility] ENDOSCOPY;  Service: Pulmonary;;   BRONCHIAL NEEDLE ASPIRATION BIOPSY  04/12/2020   Procedure: BRONCHIAL NEEDLE ASPIRATION BIOPSIES;  Surgeon: Leslye Peer, MD;  Location: [facility] ENDOSCOPY;  Service: Pulmonary;;   BRONCHIAL WASHINGS  10/13/2019   Procedure: BRONCHIAL WASHINGS;  Surgeon: Leslye Peer, MD;  Location: [facility] ENDOSCOPY;  Service: Pulmonary;;   BRONCHIAL WASHINGS  04/12/2020   Procedure: BRONCHIAL WASHINGS;  Surgeon: Leslye Peer, MD;  Location: [facility] ENDOSCOPY;  Service: Pulmonary;;   CARDIAC CATHETERIZATION  2002   50% RCA   CARDIOVERSION N/A 09/17/2022   Procedure: CARDIOVERSION;  Surgeon: Iran Ouch, MD;  Location: [facility] ORS;  Service: Cardiovascular;  Laterality: N/A;   CATARACT  EXTRACTION, BILATERAL     CORONARY STENT INTERVENTION N/A 04/15/2018   Procedure: CORONARY STENT INTERVENTION;  Surgeon: Corky Crafts, MD;  Location: [facility] INVASIVE CV LAB;  Service: Cardiovascular;  Laterality: N/A;  om1   EYE SURGERY     KNEE ARTHROSCOPY     LEFT HEART CATH AND CORONARY ANGIOGRAPHY N/A 04/15/2018   Procedure: LEFT HEART CATH AND CORONARY ANGIOGRAPHY;  Surgeon: Corky Crafts, MD;  Location: [facility] INVASIVE CV LAB;  Service: Cardiovascular;  Laterality: N/A;   MULTIPLE TOOTH EXTRACTIONS     TONSILLECTOMY     VIDEO BRONCHOSCOPY  04/12/2020   VIDEO BRONCHOSCOPY WITH ENDOBRONCHIAL NAVIGATION N/A 07/26/2016   Procedure: VIDEO BRONCHOSCOPY WITH ENDOBRONCHIAL NAVIGATION;  Surgeon: Leslye Peer, MD;  Location: [facility] OR;  Service: Thoracic;  Laterality: N/A;   VIDEO BRONCHOSCOPY WITH ENDOBRONCHIAL NAVIGATION N/A 10/13/2019   Procedure: [facility] AND VIDEO BRONCHOSCOPY WITH ENDOBRONCHIAL NAVIGATION;  Surgeon: Leslye Peer, MD;  Location: [facility] ENDOSCOPY;  Service: Pulmonary;  Laterality: N/A;   VIDEO BRONCHOSCOPY WITH ENDOBRONCHIAL NAVIGATION N/A 04/12/2020   Procedure: VIDEO BRONCHOSCOPY WITH ENDOBRONCHIAL NAVIGATION;  Surgeon: Leslye Peer, MD;  Location: [facility] ENDOSCOPY;  Service: Pulmonary;  Laterality: N/A;   HPI:  [name] [name]. is a 82 y.o. male with medical history significant for HTN,  persistent A-fib on Eliquis and Tikosyn, COPD on 2-3 oxygen, PVD, CAD,with stents, anxiety, BPH, who presents to the ED with 2 weeks of wheezes which became worse in the past 2 days of shortness of breath and wheezing, receiving DuoNebs and Solu-Medrol en route with EMS.  O2 sat was in the low 80s on his home flow rate.  Denies chest pain, fever or chills.  Patient states he has been coughing for a while and had 2 courses of prednisone.  States he was hospitalized earlier this year with RSV but seems like he never bounced back. Pt with barium swallow on 08/20/2018 the revealed "FINDINGS: Initial  barium swallows demonstrate normal pharyngeal motion with swallowing. No laryngeal penetration or aspiration. No upper  esophageal webs or strictures. There is a slightly prominent cricopharyngeal impression and a small Zenker's diverticulum. Normal esophageal motility. No intrinsic or extrinsic lesions are identified. No mucosal abnormalities. Very small sliding-type hiatal hernia but no GE reflux was demonstrated.    Assessment / Plan / Recommendation  Clinical Impression  Pt presents with adequate oropharyngeal abilities when consuming puree, graham crackers and thin liquids via straw. Pt has history of hiatal hernia so this Clinical research associate provided education on reflux precautions to reduce the risk of post prandial aspiration. At this time, pt appears appropriate for a regular diet with thin liquids via straw, medicine whole with thin liquids and strict reflux precautions. All education completed therefore skilled ST services are not indicated at this time. SLP Visit Diagnosis: Dysphagia, unspecified (R13.10)    Aspiration Risk  No limitations    Diet Recommendation Regular;Thin liquid    Liquid Administration via: Straw Medication Administration: Whole meds with liquid Supervision: Patient able to self feed Compensations: Minimize environmental distractions;Slow rate;Small sips/bites Postural Changes: Seated upright at 90 degrees;Remain upright for at least 30 minutes after po intake (raise HOB)    Other  Recommendations Oral Care Recommendations: Oral care BID    Recommendations for follow up therapy are one component of a multi-disciplinary discharge planning process, led by the attending physician.  Recommendations may be updated based on patient status, additional functional criteria and insurance authorization.  Follow up Recommendations No SLP follow up         Functional Status Assessment Patient has not had a recent decline in their functional status    Swallow Study   General Date  of Onset: 05/31/23 HPI: [name] [name]. is a 82 y.o. 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  O2 sat was in the low 80s on his home flow rate.  Denies chest pain, fever or chills.  Patient states he has been coughing for a while and had 2 courses of prednisone.  States he was hospitalized earlier this year with RSV but seems like he never bounced back. Pt with barium swallow on 08/20/2018 the revealed "FINDINGS: Initial barium swallows demonstrate normal pharyngeal motion with swallowing. No laryngeal penetration or aspiration. No upper  esophageal webs or strictures. There is a slightly prominent cricopharyngeal impression and a small Zenker's diverticulum. Normal esophageal motility. No intrinsic or extrinsic lesions are identified. No mucosal abnormalities. Very small sliding-type hiatal hernia but no GE reflux was demonstrated. Type of Study: Bedside Swallow Evaluation Previous Swallow  Assessment: none in chart Diet Prior to this Study: NPO Temperature Spikes Noted: No Respiratory Status: Nasal cannula History of Recent Intubation: No Behavior/Cognition: Alert;Cooperative;Pleasant mood Oral Cavity Assessment: Within Functional Limits Oral Care Completed by SLP: No Oral Cavity - Dentition: Adequate natural dentition Vision: Functional for self-feeding Self-Feeding Abilities: Able to feed self Patient Positioning: Upright in bed Baseline Vocal Quality: Normal Volitional Cough: Strong Volitional Swallow: Able to elicit    Oral/Motor/Sensory Function Overall Oral Motor/Sensory Function: Within functional limits   Ice Chips Ice chips: Not tested   Thin Liquid Thin Liquid: Within functional limits Presentation: Self  Fed;Straw    Nectar Thick Nectar Thick Liquid: Not tested   Honey Thick Honey Thick Liquid: Not tested   Puree Puree: Within functional limits Presentation: Self Fed;Spoon   Solid     Solid: Within functional limits Presentation: Self Fed     [name] [name] B. Dreama Saa, M.S., CCC-SLP, Tree surgeon Certified Brain Injury Specialist [facility]  [facility] Rehabilitation Services Office [phone] Ascom [phone] Fax [phone]

## 2023-06-01 NOTE — Progress Notes (Signed)
Pt came from ED via bed. Patient is alert and oriented X 4. Pi/v in his left AC. Bruises in both upper and lower extremities. +3 pitting edema in both lower extremities. Denies any pain.

## 2023-06-01 NOTE — Progress Notes (Signed)
Patient has home meds and refuses to send them to pharmacy. Meds are in a pill organizer and not medicine bottles. Wants to talk to a pulmonologist before taking our inhalers that were prescribed, pt educated on not taking his home meds and inhaler but he insist.

## 2023-06-02 DIAGNOSIS — J441 Chronic obstructive pulmonary disease with (acute) exacerbation: Secondary | ICD-10-CM | POA: Diagnosis not present

## 2023-06-02 LAB — BASIC METABOLIC PANEL
Anion gap: 8 (ref 5–15)
BUN: 32 mg/dL — ABNORMAL HIGH (ref 8–23)
CO2: 34 mmol/L — ABNORMAL HIGH (ref 22–32)
Calcium: 8.5 mg/dL — ABNORMAL LOW (ref 8.9–10.3)
Chloride: 100 mmol/L (ref 98–111)
Creatinine, Ser: 0.93 mg/dL (ref 0.61–1.24)
GFR, Estimated: >60 mL/min (ref >60)
Glucose, Bld: 143 mg/dL — ABNORMAL HIGH (ref 70–99)
Potassium: 4.9 mmol/L (ref 3.5–5.1)
Sodium: 142 mmol/L (ref 135–145)

## 2023-06-02 LAB — CBC
HCT: 32.8 % — ABNORMAL LOW (ref 39.0–52.0)
Hemoglobin: 11.1 g/dL — ABNORMAL LOW (ref 13.0–17.0)
MCH: 31 pg (ref 26.0–34.0)
MCHC: 33.8 g/dL (ref 30.0–36.0)
MCV: 91.6 fL (ref 80.0–100.0)
Platelets: 170 10*3/uL (ref 150–400)
RBC: 3.58 MIL/uL — ABNORMAL LOW (ref 4.22–5.81)
RDW: 14.8 % (ref 11.5–15.5)
WBC: 10.6 10*3/uL — ABNORMAL HIGH (ref 4.0–10.5)
nRBC: 0 % (ref 0.0–0.2)

## 2023-06-02 MED ORDER — FERROUS GLUCONATE 324 (38 FE) MG PO TABS
324.0000 mg | ORAL_TABLET | Freq: Every day | ORAL | Status: DC
  Administered 2023-06-03: 324 mg via ORAL
  Filled 2023-06-02: qty 1

## 2023-06-02 MED ORDER — IPRATROPIUM-ALBUTEROL 0.5-2.5 (3) MG/3ML IN SOLN
3.0000 mL | Freq: Three times a day (TID) | RESPIRATORY_TRACT | Status: DC
  Administered 2023-06-02 (×2): 3 mL via RESPIRATORY_TRACT
  Filled 2023-06-02 (×2): qty 3

## 2023-06-02 MED ORDER — PANTOPRAZOLE SODIUM 40 MG PO TBEC
40.0000 mg | DELAYED_RELEASE_TABLET | Freq: Every day | ORAL | Status: DC
  Administered 2023-06-02 – 2023-06-03 (×2): 40 mg via ORAL
  Filled 2023-06-02 (×2): qty 1

## 2023-06-02 MED ORDER — METHYLPREDNISOLONE SODIUM SUCC 40 MG IJ SOLR
40.0000 mg | Freq: Two times a day (BID) | INTRAMUSCULAR | Status: DC
  Administered 2023-06-02 – 2023-06-03 (×3): 40 mg via INTRAVENOUS
  Filled 2023-06-02 (×3): qty 1

## 2023-06-02 MED ORDER — IPRATROPIUM-ALBUTEROL 0.5-2.5 (3) MG/3ML IN SOLN
3.0000 mL | Freq: Four times a day (QID) | RESPIRATORY_TRACT | Status: DC
  Administered 2023-06-02 – 2023-06-03 (×3): 3 mL via RESPIRATORY_TRACT
  Filled 2023-06-02 (×3): qty 3

## 2023-06-02 MED ORDER — APIXABAN 5 MG PO TABS
5.0000 mg | ORAL_TABLET | Freq: Two times a day (BID) | ORAL | Status: DC
  Administered 2023-06-02 – 2023-06-03 (×2): 5 mg via ORAL
  Filled 2023-06-02 (×2): qty 1

## 2023-06-02 NOTE — Consult Note (Signed)
WOC Nurse Consult Note: Reason for Consult: right arm skin tear Wound type: skin tear Pressure Injury POA: NA Measurement:see nursing flow sheets Wound bed:see nursing flow sheets Drainage (amount, consistency, odor) see nursing flow sheets Periwound: intact  Dressing procedure/placement/frequency: Utilize nursing skin care order set for skin tears.  Cleanse with saline, pat dry Apply single layer of xeroform gauze, top with foam if skin is not too fragile, or secure with kerlix.  Change every other day   Re consult if needed, will not follow at this time. Thanks  Tonya Wantz [employer], RN,CWOCN, CNS, CWON-AP [phone])

## 2023-06-02 NOTE — Plan of Care (Signed)
  Problem: Education: Goal: Knowledge of disease or condition will improve Outcome: Progressing Goal: Knowledge of the prescribed therapeutic regimen will improve Outcome: Progressing Goal: Individualized Educational Video(s) Outcome: Progressing   Problem: Activity: Goal: Ability to tolerate increased activity will improve Outcome: Progressing Goal: Will verbalize the importance of balancing activity with adequate rest periods Outcome: Progressing   Problem: Respiratory: Goal: Ability to maintain a clear airway will improve Outcome: Progressing Goal: Levels of oxygenation will improve Outcome: Progressing Goal: Ability to maintain adequate ventilation will improve Outcome: Progressing   Problem: Education: Goal: Knowledge of General Education information will improve Description: Including pain rating scale, medication(s)/side effects and non-pharmacologic comfort measures Outcome: Progressing   Problem: Health Behavior/Discharge Planning: Goal: Ability to manage health-related needs will improve Outcome: Progressing   Problem: Clinical Measurements: Goal: Ability to maintain clinical measurements within normal limits will improve Outcome: Progressing Goal: Will remain free from infection Outcome: Progressing Goal: Diagnostic test results will improve Outcome: Progressing Goal: Respiratory complications will improve Outcome: Progressing Goal: Cardiovascular complication will be avoided Outcome: Progressing   Problem: Activity: Goal: Risk for activity intolerance will decrease Outcome: Progressing   Problem: Nutrition: Goal: Adequate nutrition will be maintained Outcome: Progressing   Problem: Coping: Goal: Level of anxiety will decrease Outcome: Progressing   Problem: Elimination: Goal: Will not experience complications related to bowel motility Outcome: Progressing Goal: Will not experience complications related to urinary retention Outcome: Progressing    Problem: Pain Management: Goal: General experience of comfort will improve Outcome: Progressing   Problem: Safety: Goal: Ability to remain free from injury will improve Outcome: Progressing   Problem: Skin Integrity: Goal: Risk for impaired skin integrity will decrease Outcome: Progressing

## 2023-06-02 NOTE — Evaluation (Signed)
Physical Therapy Evaluation Patient Details Name: [name] [name]. MRN: [id number] DOB: 1941/02/26 Today's Date: 06/02/2023  History of Present Illness   [name] [name]. is a 82 y.o. 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  O2 sat was in the low 80s on his home flow rate.  Denies chest pain, fever or chills.  Patient states he has been coughing for a while and had 2 courses of prednisone.  States he was hospitalized earlier this year with RSV but seems like he never bounced back. ED course and data review: Tachycardic to 102 in the ED requiring 6 L to maintain sats in the high 90s to 100.   Clinical Impression  Pt received in bed with O2 at 3L at first not agreeable 2/2 to SOB and tired after eating breakfast but agreed to PT after pt education. Pt is a resident of [facility]  with his wife where he was receiving 3 x week PT nad he wneto gym the other 2 days [place]. However, the functional status has declined since over 8 weeks after COVID infection which was later followed bu RSV causing SOB and increased needs for O2 use. Pt goals is to return to [facility] and get therapy. PT assessment revealed weakness, poor aerobic capacity resulting in increased needs of assistance with all functions. Pt's skin is extremely fragile and at high risk of tears with gentle touch. Pt tends to be impulsive 2/2 to SOB, and over estimates his ability. Pt's SPO2 remained 100% with 3 L and exertion but pt demanded 4L 2/2 to perceived exertion is high. PLB training completed. Pt will benefit from continued PT Interventions beyond Acute and PT has referred pt to Mobility specialist to improve Aerobic capacity. PT will continue in Acute.       If  plan is discharge home, recommend the following: A little help with walking and/or transfers;A lot of help with bathing/dressing/bathroom;Assistance with cooking/housework;Direct supervision/assist for medications management;Assist for transportation;Help with stairs or ramp for entrance   Can travel by private vehicle        Equipment Recommendations None recommended by PT (pt has all equipment)  Recommendations for Other Services       Functional Status Assessment Patient has had a recent decline in their functional status and demonstrates the ability to make significant improvements in function in a reasonable and predictable amount of time.     Precautions / Restrictions Precautions Precautions: Fall Restrictions Weight Bearing Restrictions: No      Mobility  Bed Mobility Overal bed mobility: Independent                  Transfers Overall transfer level: Needs assistance Equipment used: Rolling walker (2 wheels) Transfers: Sit to/from Stand, Bed to chair/wheelchair/BSC Sit to Stand: Contact guard assist (from higher bed otherwise Mod assist.)   Step pivot transfers: Min assist       General transfer comment: Pt relies heavily on FWW with increased SOB.    Ambulation/Gait Ambulation/Gait assistance: Min assist Gait Distance (Feet): 3 Feet Assistive device: Rolling walker (2 wheels) Gait Pattern/deviations: Shuffle Gait velocity: dec     General Gait Details: side steps and turn  Stairs  Wheelchair Mobility     Tilt Bed    Modified Rankin (Stroke Patients Only)       Balance Overall balance assessment: Needs assistance Sitting-balance support: Feet supported Sitting balance-[name]: Good Sitting balance - Comments: sup to prevent falls 2/.2 to fatigue.   Standing balance support: Bilateral upper extremity supported Standing balance-[name]: Good Standing balance comment: No lOB noted, but severe increase in SOB,  Tachycardia and RR                             Pertinent Vitals/Pain Pain Assessment Pain Assessment: No/denies pain    Home Living Family/patient expects to be discharged to:: Private residence Living Arrangements: Spouse/significant other Available Help at Discharge: Family;Available PRN/intermittently Type of Home: House Home Access: Level entry       Home Layout: One level Home Equipment: Rollator (4 wheels);Shower seat;Transport chair;Electric scooter Additional Comments: [employer]    Prior Function Prior Level of Function : Independent/Modified Independent;Driving             Mobility Comments: Independent ambulating without AD use in home; uses transport chair or electric scooter in community.  3 L home O2 at night; 3 L O2 at rest during day; 3.5-4 L O2 with activity during day.  No recent falls reported. ADLs Comments: until 8-9 weeks ago, pt was drivnig and Ind ambualtor.     Extremity/Trunk Assessment   Upper Extremity Assessment Upper Extremity Assessment: Defer to OT evaluation    Lower Extremity Assessment Lower Extremity Assessment: Generalized weakness       Communication   Communication Communication: No apparent difficulties Cueing Techniques: Verbal cues (for safety adn to slow down.)  Cognition Arousal: Alert Behavior During Therapy: Impulsive, Anxious Overall Cognitive Status: Within Functional Limits for tasks assessed                                          General Comments General comments (skin integrity, edema, etc.): Pt is on eleiquis and Pratnazone, skin i sfragile and tears easily.    Exercises General Exercises - Lower Extremity Ankle Circles/Pumps: AROM, Both, 10 reps, Seated Heel Slides: AROM, Both, 10 reps, Seated Straight Leg Raises: AROM, Both, Seated, 10 reps Other Exercises Other Exercises: Bridges 10 reps seated. Other Exercises: Same exs every 3-4 hours 5 to 10 reps with O2 on.    Assessment/Plan    PT Assessment Patient needs continued PT services  PT Problem List         PT Treatment Interventions Gait training;Functional mobility training;Therapeutic activities;Therapeutic exercise;Balance training;Neuromuscular re-education;Patient/family education    PT Goals (Current goals can be found in the Care Plan section)  Acute Rehab PT Goals Patient Stated Goal: I want  return to [facility] and be like before walk 1/4 mile with my PT>" PT Goal Formulation: With patient Time For Goal Achievement: 06/16/23 Potential to Achieve Goals: Good    Frequency Min 1X/week     Co-evaluation               AM-PAC PT "6 Clicks" Mobility  Outcome Measure Help needed turning from your back to your side while in a flat bed without using bedrails?: None Help needed moving from lying on your back to sitting on the side of a flat bed without using bedrails?: A Little Help needed moving to and  from a bed to a chair (including a wheelchair)?: A Little Help needed standing up from a chair using your arms (e.g., wheelchair or bedside chair)?: A Little Help needed to walk in hospital room?: A Lot Help needed climbing 3-5 steps with a railing? : Total 6 Click Score: 16    End of Session Equipment Utilized During Treatment: Gait belt       PT Visit Diagnosis: Difficulty in walking, not elsewhere classified (R26.2);Muscle weakness (generalized) (M62.81)    Time: 3664-4034 PT Time Calculation (min) (ACUTE ONLY): 30 min   Charges:   PT Evaluation $PT Eval Moderate Complexity: 1 Mod PT Treatments $Therapeutic Activity: 8-22 mins PT General Charges $$ ACUTE PT VISIT: 1 Visit         Janet Berlin PT DPT 11:44 AM,06/02/23

## 2023-06-02 NOTE — Evaluation (Signed)
Occupational Therapy Evaluation Patient Details Name: [name] [name]. MRN: [id number] DOB: 11-Jun-1941 Today's Date: 06/02/2023   History of Present Illness As per EMR, [name]. is a 82 y.o. 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  O2 sat was in the low 80s on his home flow rate.  Denies chest pain, fever or chills.  Patient states he has been coughing for a while and had 2 courses of prednisone.  States he was hospitalized earlier this year with RSV but seems like he never bounced back.  ED course and data review: Tachycardic to 102 in the ED requiring 6 L to maintain sats in the high 90s to 100   Clinical Impression   Pt was seen for OT evaluation this date. Prior to hospital admission, pt experiencing functional decline since COVID infection, and has an aide assisting with bathing 2x weekly but otherwise MOD I for ADLs. Pt lives with spouse at [facility] independent living. Was previously participating in OT/PT several times weekly and working out at home.   Pt presents to acute OT demonstrating impaired ADL performance and functional mobility 2/2 decreased activity tolerance, generalized weakness and is frequently SOB with light task performance which increases his need for physical assist (See OT problem list for additional functional deficits). Pt currently below baseline, requiring MAX A for UB dressing and MOD A for seated grooming tasks on OT eval. Impulsive tenancies with poor overall safety awareness. Transfers with CGA from recliner <> EOB. Anticipate MOD-MAX A for LB ADLs at this time. Pt received on 4L O2 via [place], and SpO2% dropping to 89% with seated UB grooming, returning to 93% after PLB. Pt would  benefit from skilled OT services to address noted impairments and functional limitations (see below for any additional details) in order to maximize safety and independence while minimizing falls risk and caregiver burden. Pt wishes to return home with therapy after hospital discharge. Anticipate the need for follow up OT services upon acute hospital DC.        If plan is discharge home, recommend the following: A little help with walking and/or transfers;A lot of help with bathing/dressing/bathroom;Assist for transportation    Functional Status Assessment  Patient has had a recent decline in their functional status and demonstrates the ability to make significant improvements in function in a reasonable and predictable amount of time.  Equipment Recommendations  None recommended by OT    Recommendations for Other Services       Precautions / Restrictions Precautions Precautions: Fall Restrictions Weight Bearing Restrictions: No      Mobility Bed Mobility               General bed mobility comments: in recliner start/end session    Transfers Overall transfer level: Needs assistance Equipment used: None Transfers: Sit to/from Stand, Bed to chair/wheelchair/BSC Sit to Stand: Contact guard assist     Step pivot transfers: Contact guard assist     General transfer comment: Pt stands spontanously from recliner (no alarm on start of session) without AD, endorsing that he has not been using RW in room. Transfers with CGA, but increased SOB and cues for safety/mgmt of O2 line  Balance Overall balance assessment: Needs assistance Sitting-balance support: Feet supported Sitting balance-[name]: Good     Standing balance support: No upper extremity supported Standing balance-[name]: Good Standing balance comment: SOB and rest break needed                           ADL either performed or assessed with clinical judgement   ADL Overall ADL's :  Needs assistance/impaired     Grooming: Brushing hair;Sitting;Moderate assistance;Oral care (shaving) Grooming Details (indicate cue type and reason): Pt typically performs grooming tasks sitting at home. OT provides simulated setup for oral care, brushing hair and shaving (pt had personal electric shaver). Pt completes task with frequent rest breaks due to dyspena, and reports feeling UE weakness, requesting spouse help with brushing hair as his arms are fatigued         Upper Body Dressing : Maximal assistance;Sitting Upper Body Dressing Details (indicate cue type and reason): max A from sitting to don shirt/manager O2     Toilet Transfer: Contact guard assist Toilet Transfer Details (indicate cue type and reason): Simulated from recliner <> EOB, pt impulsive and stands without managing O2 line, requriing cues to redirect for safety         Functional mobility during ADLs: Cueing for sequencing;Cueing for safety General ADL Comments: Pt currently below baseline, requiring MAX A for UB dressing and MOD A for seated grooming tasks on OT eval. Very poor activity tolerance, and requires frequent cues for breathing techniques. Transfers with CGA from recliner <> EOB. Anticipate MOD-MAX A for LB ADLs at this time.     Vision Baseline Vision/History: 1 Wears glasses              Pertinent Vitals/Pain Pain Assessment Pain Assessment: No/denies pain     Extremity/Trunk Assessment Upper Extremity Assessment Upper Extremity Assessment: Defer to OT evaluation   Lower Extremity Assessment Lower Extremity Assessment: Generalized weakness       Communication Communication Communication: No apparent difficulties Cueing Techniques: Verbal cues (for safety adn to slow down.)   Cognition Arousal: Alert Behavior During Therapy: Impulsive Overall Cognitive Status: Within Functional Limits for tasks assessed                                       General Comments  Pt  is on eleiquis and Pratnazone, skin i sfragile and tears easily.    Exercises Exercises: General Upper Extremity General Exercises - Upper Extremity Shoulder Flexion: Strengthening, Both, 10 reps, Seated Shoulder Horizontal ABduction: Strengthening, 10 reps, Both, Seated Other Exercises Other Exercises: Discussed functional ROM strengthening exercises. Pt previously performing UB exercises with 10# weights with last OT, reports he has been only tolerating 5#. OT discusses functional UE exercises involving shoulder movements vs just bicep curls (which is what pt performing at home). Recommending short circuts vs reps to perform UE movement for 10 seconds, rest, next UE exercise. Pt and spouse verbalzie understanding        Home Living Family/patient expects to be discharged to:: Private residence Living Arrangements: Spouse/significant other Available Help at Discharge: Family;Available PRN/intermittently Type of Home: House Home Access: Level entry     Home Layout: One level     Bathroom Shower/Tub: Producer, television/film/video: Standard     Home Equipment: Rollator (4 wheels);Shower seat;Transport chair;Electric scooter   Additional Comments: [employer].      Prior Functioning/Environment Prior Level of Function : Independent/Modified Independent;Driving             Mobility Comments: Independent ambulating without AD use in home; uses transport chair or electric scooter in community.  3 L home O2 at night; 3 L O2 at rest during day; 3.5-4 L O2 with activity during day.  No recent falls reported. ADLs Comments: until 8-9 weeks ago, pt was drivnig and Ind ambualtor.        OT Problem List: Decreased strength;Decreased range of motion;Decreased activity tolerance;Impaired balance (sitting and/or standing);Decreased cognition;Decreased safety awareness;Decreased knowledge of use of DME or AE;Cardiopulmonary status limiting activity      OT  Treatment/Interventions: Self-care/ADL training;Therapeutic exercise;Energy conservation;DME and/or AE instruction;Therapeutic activities;Balance training;Patient/family education    OT Goals(Current goals can be found in the care plan section) Acute Rehab OT Goals OT Goal Formulation: With patient/family Time For Goal Achievement: 06/16/23 Potential to Achieve Goals: Good  OT Frequency: Min 1X/week       AM-PAC OT "6 Clicks" Daily Activity     Outcome Measure Help from another person eating meals?: None Help from another person taking care of personal grooming?: A Little Help from another person toileting, which includes using toliet, bedpan, or urinal?: A Lot Help from another person bathing (including washing, rinsing, drying)?: A Lot Help from another person to put on and taking off regular upper body clothing?: A Lot Help from another person to put on and taking off regular lower body clothing?: A Lot 6 Click Score: 15   End of Session Equipment Utilized During Treatment: Oxygen (on 4L via [place]) Nurse Communication: Mobility status  Activity Tolerance: Patient tolerated treatment well Patient left: in chair;with call bell/phone within reach;with chair alarm set;with family/visitor present  OT Visit Diagnosis: Unsteadiness on feet (R26.81);Other abnormalities of gait and mobility (R26.89);Muscle weakness (generalized) (M62.81)                Time: 4132-4401 OT Time Calculation (min): 40 min Charges:  OT General Charges $OT Visit: 1 Visit OT Evaluation $OT Eval Low Complexity: 1 Low OT Treatments $Self Care/Home Management : 8-22 mins $Therapeutic Activity: 8-22 mins  Jonell Krontz L. Oneisha Ammons, OTR/L  06/02/23, 1:13 PM

## 2023-06-02 NOTE — Progress Notes (Signed)
PROGRESS NOTE    [name] [name].  UJW:[id number] DOB: [birth year] DOA: 05/31/2023 PCP: [name], MD   Assessment & Plan:   Active Problems:   Acute on chronic respiratory failure with hypoxia (HCC)   COPD with acute exacerbation (HCC)   Aspiration pneumonia (HCC)   Chronic anticoagulation   Persistent atrial fibrillation (HCC)   Anemia, unspecified   Hyperglycemia   Coronary artery disease   Pulmonary nodules   Essential (primary) hypertension   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HCC)   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HCC)  Assessment and Plan: COPD exacerbation: continue on azithromycin, steroids, bronchodilators, encourage incentive spirometry. Continue on supplemental oxygen and wean back to baseline   Acute on chronic hypoxic respiratory failure: continue on supplemental oxygen and wean back to baseline as tolerated    Aspiration pneumonia: continue on IV rocephin, azithromycin, bronchodilators & encourage incentive spirometry      Hyperglycemia: likely secondary to steroid use    IDA: will start po iron tomorrow    Persistent a. fib: continue on home dose of tikosyn, diltiazem, eliquis   Hx of CAD: no chest pain currently. Continue on statin   Hx of prostate cancer: management per [facility] outpatient    HTN: continue on diltiazem, lasix        DVT prophylaxis: lovenox Code Status: full Family Communication: Disposition Plan: PT/OT recs SNF   Level of care: Telemetry Medical Status is: Inpatient Remains inpatient appropriate because: severity of illness    Consultants:    Procedures:   Antimicrobials:   Subjective: Pt still c/o shortness of breath   Objective: Vitals:   06/01/23 2103 06/01/23 2238 06/02/23 0032 06/02/23 0601  BP: 120/83  (!) 109/41   Pulse: (!) 102  84   Resp: 20  20   Temp: 98.7 F (37.1 C)  98.1 F (36.7 C)   TempSrc:      SpO2: 98% 100% 99% 100%  Weight:      Height:        Intake/Output Summary (Last 24 hours) at 06/02/2023  0750 Last data filed at 06/02/2023 5621 Gross per 24 hour  Intake --  Output 800 ml  Net -800 ml   Filed Weights   05/31/23 1452  Weight: 72.1 kg    Examination:  General exam: appears uncomfortable  Respiratory system: diminished breaths sounds b/l  Cardiovascular system: irregularly irregular  Gastrointestinal system: abd is soft, NT, ND & hypoactive bowel sounds Central nervous system: alert & awake Psychiatry: Judgement and insight appears at baseline. Flat mood and affect    Data Reviewed: I have personally reviewed following labs and imaging studies  CBC: Recent Labs  Lab 05/31/23 1530 06/01/23 0730 06/02/23 0533  WBC 13.3* 8.3 10.6*  NEUTROABS 11.9*  --   --   HGB 12.3* 11.2* 11.1*  HCT 36.6* 33.0* 32.8*  MCV 92.4 90.7 91.6  PLT 136* 140* 170   Basic Metabolic Panel: Recent Labs  Lab 05/31/23 1530 06/01/23 0730 06/02/23 0533  NA 140 137 142  K 4.4 4.2 4.9  CL 97* 97* 100  CO2 33* 32 34*  GLUCOSE 200* 190* 143*  BUN 37* 27* 32*  CREATININE 0.96 0.66 0.93  CALCIUM 8.2* 8.1* 8.5*   GFR: Estimated Creatinine Clearance: 62.5 mL/min (by C-G formula based on SCr of 0.93 mg/dL). Liver Function Tests: No results for input(s): "AST", "ALT", "ALKPHOS", "BILITOT", "PROT", "ALBUMIN" in the last 168 hours. No results for input(s): "LIPASE", "AMYLASE" in the last 168 hours. No results for  input(s): "AMMONIA" in the last 168 hours. Coagulation Profile: No results for input(s): "INR", "PROTIME" in the last 168 hours. Cardiac Enzymes: No results for input(s): "CKTOTAL", "CKMB", "CKMBINDEX", "TROPONINI" in the last 168 hours. BNP (last 3 results) No results for input(s): "PROBNP" in the last 8760 hours. HbA1C: No results for input(s): "HGBA1C" in the last 72 hours. CBG: No results for input(s): "GLUCAP" in the last 168 hours. Lipid Profile: No results for input(s): "CHOL", "HDL", "LDLCALC", "TRIG", "CHOLHDL", "LDLDIRECT" in the last 72 hours. Thyroid Function  Tests: No results for input(s): "TSH", "T4TOTAL", "FREET4", "T3FREE", "THYROIDAB" in the last 72 hours. Anemia Panel: Recent Labs    05/31/23 1852 06/01/23 0730  VITAMINB12  --  655  FOLATE 32.0  --   FERRITIN 222  --   TIBC 231*  --   IRON 31*  --   RETICCTPCT  --  2.6   Sepsis Labs: No results for input(s): "PROCALCITON", "LATICACIDVEN" in the last 168 hours.  Recent Results (from the past 240 hour(s))  Resp panel by RT-PCR (RSV, Flu A&B, Covid) Anterior Nasal Swab     Status: None   Collection Time: 05/31/23  4:29 PM   Specimen: Anterior Nasal Swab  Result Value Ref Range Status   SARS Coronavirus 2 by RT PCR NEGATIVE NEGATIVE Final    Comment: (NOTE) SARS-CoV-2 target nucleic acids are NOT DETECTED.  The SARS-CoV-2 RNA is generally detectable in upper respiratory specimens during the acute phase of infection. The lowest concentration of SARS-CoV-2 viral copies this assay can detect is 138 copies/mL. A negative result does not preclude SARS-Cov-2 infection and should not be used as the sole basis for treatment or other patient management decisions. A negative result may occur with  improper specimen collection/handling, submission of specimen other than nasopharyngeal swab, presence of viral mutation(s) within the areas targeted by this assay, and inadequate number of viral copies(<138 copies/mL). A negative result must be combined with clinical observations, patient history, and epidemiological information. The expected result is Negative.  Fact Sheet for Patients:  BloggerCourse.com  Fact Sheet for Healthcare Providers:  SeriousBroker.it  This test is no t yet approved or cleared by the [place] FDA and  has been authorized for detection and/or diagnosis of SARS-CoV-2 by FDA under an Emergency Use Authorization (EUA). This EUA will remain  in effect (meaning this test can be used) for the duration of  the COVID-19 declaration under Section 564(b)(1) of the Act, 21 U.S.C.section 360bbb-3(b)(1), unless the authorization is terminated  or revoked sooner.       Influenza A by PCR NEGATIVE NEGATIVE Final   Influenza B by PCR NEGATIVE NEGATIVE Final    Comment: (NOTE) The Xpert Xpress SARS-CoV-2/FLU/RSV plus assay is intended as an aid in the diagnosis of influenza from Nasopharyngeal swab specimens and should not be used as a sole basis for treatment. Nasal washings and aspirates are unacceptable for Xpert Xpress SARS-CoV-2/FLU/RSV testing.  Fact Sheet for Patients: BloggerCourse.com  Fact Sheet for Healthcare Providers: SeriousBroker.it  This test is not yet approved or cleared by the [place] FDA and has been authorized for detection and/or diagnosis of SARS-CoV-2 by FDA under an Emergency Use Authorization (EUA). This EUA will remain in effect (meaning this test can be used) for the duration of the COVID-19 declaration under Section 564(b)(1) of the Act, 21 U.S.C. section 360bbb-3(b)(1), unless the authorization is terminated or revoked.     Resp Syncytial Virus by PCR NEGATIVE NEGATIVE Final    Comment: (  NOTE) Fact Sheet for Patients: BloggerCourse.com  Fact Sheet for Healthcare Providers: SeriousBroker.it  This test is not yet approved or cleared by the [place] FDA and has been authorized for detection and/or diagnosis of SARS-CoV-2 by FDA under an Emergency Use Authorization (EUA). This EUA will remain in effect (meaning this test can be used) for the duration of the COVID-19 declaration under Section 564(b)(1) of the Act, 21 U.S.C. section 360bbb-3(b)(1), unless the authorization is terminated or revoked.  Performed at [facility], [address] [place], [place] [address]          Radiology Studies: CT Angio Chest PE W and/or Wo  Contrast  Result Date: 05/31/2023 CLINICAL DATA:  Shortness of breath for 4 days EXAM: CT ANGIOGRAPHY CHEST WITH CONTRAST TECHNIQUE: Multidetector CT imaging of the chest was performed using the standard protocol during bolus administration of intravenous contrast. Multiplanar CT image reconstructions and MIPs were obtained to evaluate the vascular anatomy. RADIATION DOSE REDUCTION: This exam was performed according to the departmental dose-optimization program which includes automated exposure control, adjustment of the mA and/or kV according to patient size and/or use of iterative reconstruction technique. CONTRAST:  75mL OMNIPAQUE IOHEXOL 350 MG/ML SOLN COMPARISON:  Chest CT dated November 07, 2022 FINDINGS: Cardiovascular: No evidence of pulmonary embolus. Normal heart size. Normal caliber thoracic aorta with moderate atherosclerotic disease. Lipomatous hypertrophy of the intra-atrial septum. Severe coronary artery calcifications. Mediastinum/Nodes: Esophagus and thyroid are unremarkable. No enlarged lymph nodes seen in the chest. Lungs/Pleura: Mild bubbly debris seen in the trachea. Mild bronchial wall thickening. Severe emphysema. Bilateral solid pulmonary nodules are stable. No consolidation, pleural effusion or pneumothorax. Upper Abdomen: Exophytic right renal cysts, no specific follow-up imaging is necessary. No acute abnormality. Musculoskeletal: No chest wall abnormality. No acute or significant osseous findings. Review of the MIP images confirms the above findings. IMPRESSION: 1. No evidence of pulmonary embolus. 2. Multiple bilateral solid pulmonary nodules, stable in size when compared with most recent prior, but decreased in size when compared with more remote priors, likely sequela of prior infection. 3. Mild bubbly debris seen in the trachea, findings can be seen in the setting of aspiration. 4. Mild bronchial wall thickening, findings can be seen in the setting of bronchitis. 5. Aortic  Atherosclerosis (ICD10-I70.0) and Emphysema (ICD10-J43.9). Electronically Signed   By: Allegra Lai M.D.   On: 05/31/2023 21:46   DG Chest Portable 1 View  Result Date: 05/31/2023 CLINICAL DATA:  Shortness of breath for 4 days. EXAM: PORTABLE CHEST 1 VIEW COMPARISON:  05/08/2023 FINDINGS: Chronic hyperinflation. Chronic bronchial thickening and emphysema. Blunting of the costophrenic angles is unchanged and likely related to hyperinflation. No evidence of acute airspace disease. Normal heart size with stable mediastinal contours. No pulmonary edema. No pneumothorax. No acute osseous abnormalities are seen. IMPRESSION: Chronic hyperinflation, bronchial thickening, and emphysema. No acute findings. Electronically Signed   By: Narda Rutherford M.D.   On: 05/31/2023 16:49        Scheduled Meds:  azithromycin  500 mg Oral Daily   diltiazem  120 mg Oral BID   dofetilide  250 mcg Oral BID   enoxaparin (LOVENOX) injection  40 mg Subcutaneous Q24H   finasteride  5 mg Oral Daily   furosemide  20 mg Oral Daily   guaiFENesin  600 mg Oral BID   ipratropium-albuterol  3 mL Nebulization TID   mometasone-formoterol  2 puff Inhalation BID   predniSONE  40 mg Oral Q breakfast   rosuvastatin  20  mg Oral Daily   umeclidinium bromide  1 puff Inhalation Daily   Continuous Infusions:  cefTRIAXone (ROCEPHIN)  IV 2 g (06/02/23 7829)     LOS: 2 days      Charise Killian, MD [facility] Pager [phone]  If 7PM-7AM, please contact night-coverage [email or web address]  06/02/2023, 7:50 AM

## 2023-06-03 DIAGNOSIS — I2510 Atherosclerotic heart disease of native coronary artery without angina pectoris: Secondary | ICD-10-CM | POA: Diagnosis not present

## 2023-06-03 DIAGNOSIS — J431 Panlobular emphysema: Secondary | ICD-10-CM | POA: Diagnosis not present

## 2023-06-03 DIAGNOSIS — J9621 Acute and chronic respiratory failure with hypoxia: Secondary | ICD-10-CM | POA: Diagnosis not present

## 2023-06-03 DIAGNOSIS — R278 Other lack of coordination: Secondary | ICD-10-CM | POA: Diagnosis not present

## 2023-06-03 DIAGNOSIS — I4891 Unspecified atrial fibrillation: Secondary | ICD-10-CM | POA: Diagnosis not present

## 2023-06-03 DIAGNOSIS — I4811 Longstanding persistent atrial fibrillation: Secondary | ICD-10-CM | POA: Diagnosis not present

## 2023-06-03 DIAGNOSIS — R2689 Other abnormalities of gait and mobility: Secondary | ICD-10-CM | POA: Diagnosis not present

## 2023-06-03 DIAGNOSIS — Z9981 Dependence on supplemental oxygen: Secondary | ICD-10-CM | POA: Diagnosis not present

## 2023-06-03 DIAGNOSIS — M6281 Muscle weakness (generalized): Secondary | ICD-10-CM | POA: Diagnosis not present

## 2023-06-03 DIAGNOSIS — Z741 Need for assistance with personal care: Secondary | ICD-10-CM | POA: Diagnosis not present

## 2023-06-03 DIAGNOSIS — J441 Chronic obstructive pulmonary disease with (acute) exacerbation: Secondary | ICD-10-CM | POA: Diagnosis not present

## 2023-06-03 LAB — CBC
HCT: 33.1 % — ABNORMAL LOW (ref 39.0–52.0)
Hemoglobin: 11.1 g/dL — ABNORMAL LOW (ref 13.0–17.0)
MCH: 31.4 pg (ref 26.0–34.0)
MCHC: 33.5 g/dL (ref 30.0–36.0)
MCV: 93.5 fL (ref 80.0–100.0)
Platelets: 178 10*3/uL (ref 150–400)
RBC: 3.54 MIL/uL — ABNORMAL LOW (ref 4.22–5.81)
RDW: 14.8 % (ref 11.5–15.5)
WBC: 9.3 10*3/uL (ref 4.0–10.5)
nRBC: 0 % (ref 0.0–0.2)

## 2023-06-03 LAB — BASIC METABOLIC PANEL
Anion gap: 9 (ref 5–15)
BUN: 30 mg/dL — ABNORMAL HIGH (ref 8–23)
CO2: 33 mmol/L — ABNORMAL HIGH (ref 22–32)
Calcium: 8.4 mg/dL — ABNORMAL LOW (ref 8.9–10.3)
Chloride: 101 mmol/L (ref 98–111)
Creatinine, Ser: 0.89 mg/dL (ref 0.61–1.24)
GFR, Estimated: >60 mL/min (ref >60)
Glucose, Bld: 176 mg/dL — ABNORMAL HIGH (ref 70–99)
Potassium: 4.7 mmol/L (ref 3.5–5.1)
Sodium: 143 mmol/L (ref 135–145)

## 2023-06-03 MED ORDER — AZITHROMYCIN 500 MG PO TABS: 500.0000 mg | ORAL_TABLET | Freq: Every day | ORAL | 0 refills | Status: AC

## 2023-06-03 MED ORDER — FERROUS GLUCONATE 324 (38 FE) MG PO TABS: 324.0000 mg | ORAL_TABLET | Freq: Every day | ORAL | 0 refills | Status: DC

## 2023-06-03 NOTE — TOC Transition Note (Signed)
Transition of Care [facility]) - CM/SW Discharge Note   Patient Details  Name: [name] [name]. MRN: [id number] Date of Birth: Nov 15, 1940  Transition of Care [facility]) CM/SW Contact:  Allena Katz, LCSW Phone Number: 06/03/2023, 10:11 AM   Clinical Narrative:   CSW spoke with patient regarding SNF. Pt reports he would like to do therapy at [facility]. Pt is active with [employer] for his oxygen. Wife to bring tank to transport pt home. Pt reports he has a BSC, Rollator, transport chair and reports no needs.     Final next level of care: Home w Home Health Services Barriers to Discharge: Barriers Resolved   Patient Goals and CMS Choice CMS Medicare.gov Compare Post Acute Care list provided to:: Patient Choice offered to / list presented to : Patient  Discharge Placement                         Discharge Plan and Services Additional resources added to the After Visit Summary for                                       Social Determinants of Health (SDOH) Interventions SDOH Screenings   Food Insecurity: No Food Insecurity (06/01/2023)  Housing: Patient Declined (06/01/2023)  Transportation Needs: No Transportation Needs (06/01/2023)  Utilities: Not At Risk (06/01/2023)  Depression (PHQ2-9): Medium Risk (09/20/2022)  Financial Resource Strain: Low Risk  (01/06/2023)  Physical Activity: Sufficiently Active (01/06/2023)  Social Connections: Unknown (01/06/2023)  Stress: No Stress Concern Present (01/06/2023)  Tobacco Use: Medium Risk (05/09/2023)     Readmission Risk Interventions     No data to display

## 2023-06-03 NOTE — Plan of Care (Signed)
  Problem: Education: Goal: Knowledge of disease or condition will improve Outcome: Progressing Goal: Knowledge of the prescribed therapeutic regimen will improve Outcome: Progressing Goal: Individualized Educational Video(s) Outcome: Progressing   Problem: Activity: Goal: Ability to tolerate increased activity will improve Outcome: Progressing Goal: Will verbalize the importance of balancing activity with adequate rest periods Outcome: Progressing   Problem: Respiratory: Goal: Ability to maintain a clear airway will improve Outcome: Progressing Goal: Levels of oxygenation will improve Outcome: Progressing Goal: Ability to maintain adequate ventilation will improve Outcome: Progressing   Problem: Education: Goal: Knowledge of General Education information will improve Description: Including pain rating scale, medication(s)/side effects and non-pharmacologic comfort measures Outcome: Progressing   Problem: Health Behavior/Discharge Planning: Goal: Ability to manage health-related needs will improve Outcome: Progressing   Problem: Clinical Measurements: Goal: Ability to maintain clinical measurements within normal limits will improve Outcome: Progressing Goal: Will remain free from infection Outcome: Progressing Goal: Diagnostic test results will improve Outcome: Progressing Goal: Respiratory complications will improve Outcome: Progressing Goal: Cardiovascular complication will be avoided Outcome: Progressing   Problem: Activity: Goal: Risk for activity intolerance will decrease Outcome: Progressing   Problem: Nutrition: Goal: Adequate nutrition will be maintained Outcome: Progressing   Problem: Coping: Goal: Level of anxiety will decrease Outcome: Progressing   Problem: Elimination: Goal: Will not experience complications related to bowel motility Outcome: Progressing Goal: Will not experience complications related to urinary retention Outcome: Progressing    Problem: Pain Management: Goal: General experience of comfort will improve Outcome: Progressing   Problem: Safety: Goal: Ability to remain free from injury will improve Outcome: Progressing   Problem: Skin Integrity: Goal: Risk for impaired skin integrity will decrease Outcome: Progressing

## 2023-06-03 NOTE — Care Management Important Message (Signed)
Important Message  Patient Details  Name: [name] [name]. MRN: [id number] Date of Birth: 10/10/1940   Important Message Given:  N/A - LOS <3 / Initial given by admissions     Olegario Messier A Trev Boley 06/03/2023, 9:33 AM

## 2023-06-03 NOTE — Consult Note (Signed)
   [facility] CM Inpatient Consult   06/03/2023  [name] [name] 1941/03/24 [id number]  Primary Care Provider:  Dr. Alphonsus Sias [facility])  Patient is currently active with Care Management for chronic disease management services.  Patient has been engaged by a  RNCM.  Our community based plan of care has focused on disease management and community resource support.   Patient will receive a post hospital call and will be evaluated for assessments and disease process education.   Plan: Pending discharge disposition.  Inpatient Transition Of Care [TOC] team member to make aware that Care Management following.  Of note, Care Management services does not replace or interfere with any services that are needed or arranged by inpatient [facility] care management team.   For additional questions or referrals please contact:   Elliot Cousin, RN, [facility] Liaison [place]   [facility], Population Health Office Hours MTWF  8:00 am-6:00 pm Direct Dial: [phone] mobile [phone] [Office toll free line] Office Hours are M-F 8:30 - 5 pm [email or web address]

## 2023-06-03 NOTE — Discharge Summary (Signed)
Physician Discharge Summary  [name] [name]. ZDG:[id number] DOB: May 18, 1941 DOA: 05/31/2023  PCP: [name], MD  Admit date: 05/31/2023 Discharge date: 06/03/2023  Admitted From: [facility] Disposition:  [place]   Recommendations for Outpatient Follow-up:  Follow up with PCP in 1-2 weeks F/u w/ pulmon, Dr. Aundria Rud, in 1-2 weeks Needs outpatient PT/OT   Home Health: no  Equipment/Devices: chronic on 2-3L [place]  Discharge Condition: stable  CODE STATUS: full  Diet recommendation: Heart Healthy   Brief/Interim Summary: HPI was taken from Dr. Para March: [name] [name]. is a 82 y.o. 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  O2 sat was in the low 80s on his home flow rate.  Denies chest pain, fever or chills.  Patient states he has been coughing for a while and had 2 courses of prednisone.  States he was hospitalized earlier this year with RSV but seems like he never bounced back. ED course and data review: Tachycardic to 102 in the ED requiring 6 L to maintain sats in the high 90s to 100 Labs notable for WBC 13,000 negative respiratory viral panel and normal troponin and BNP.  Glucose 200 Hemoglobin of 12 slightly below baseline of 15 when last checked Gu[place] pending   EKG, not done   CTA PE protocol negative for PE but showing stable pulmonary nodules bronchial wall thickening, all consistent with bronchitis IMPRESSION: 1. No evidence of pulmonary embolus. 2. Multiple bilateral solid pulmonary nodules, stable in size when compared with most recent prior, but decreased in size when compared with more remote priors, likely sequela of prior infection. 3. Mild bubbly debris seen in the trachea, findings  can be seen in the setting of aspiration. 4. Mild bronchial wall thickening, findings can be seen in the setting of bronchitis. 5. Aortic Atherosclerosis (ICD10-I70.0) and Emphysema (ICD10-J43.9).     Patient treated with additional [facility] Hospitalist consulted for admission.   Discharge Diagnoses:  Active Problems:   Acute on chronic respiratory failure with hypoxia (HCC)   COPD with acute exacerbation (HCC)   Aspiration pneumonia (HCC)   Chronic anticoagulation   Persistent atrial fibrillation (HCC)   Anemia, unspecified   Hyperglycemia   Coronary artery disease   Pulmonary nodules   Essential (primary) hypertension   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HCC)   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HCC)  COPD exacerbation: continue on azithromycin, steroids, bronchodilators, encourage incentive spirometry. Continue on supplemental oxygen and back at baseline oxygen   Acute on chronic hypoxic respiratory failure: continue on supplemental oxygen and back at baseline oxygen    Aspiration pneumonia: continue on azithromycin, rocephin bronchodilators & encourage incentive spirometry      Hyperglycemia: likely secondary to steroid use    IDA: continue on iron supplement    Persistent a. fib: continue on home dose of tikosyn, diltiazem, eliquis   Hx of CAD: no chest pain currently. Continue on statin   Hx of prostate cancer: management per [facility] outpatient    HTN: continue on diltiazem, lasix   Discharge Instructions  Discharge Instructions     Diet - low sodium heart healthy   Complete by: As directed    Discharge instructions   Complete by: As directed    F/u w/  PCP in 1-2 weeks. F/u w/ pulmon, Dr. Aundria Rud, in 1-2 weeks   Discharge wound care:   Complete by: As directed    Wound care  Every other day    Comments: Cleanse R arm skin tear with saline, pat dry Cover with single layer of xeroform, top with dry dressing, secure with foam (if skin not too fragile) or with kerlix.  Change every  other day   Increase activity slowly   Complete by: As directed       Allergies as of 06/03/2023       Reactions   Flagyl [metronidazole] Other (See Comments)   Other reaction(s): skin reaction        Medication List     TAKE these medications    acetaminophen 325 MG tablet Commonly known as: TYLENOL Take 650 mg by mouth every 4 (four) hours as needed.   albuterol (2.5 MG/3ML) 0.083% nebulizer solution Commonly known as: PROVENTIL Take 2.5 mg by nebulization 2 (two) times daily.   albuterol 108 (90 Base) MCG/ACT inhaler Commonly known as: VENTOLIN HFA Inhale 2 puffs into the lungs 3 (three) times daily as needed for wheezing or shortness of breath.   apixaban 5 MG Tabs tablet Commonly known as: ELIQUIS Take 1 tablet (5 mg total) by mouth 2 (two) times daily.   azithromycin 500 MG tablet Commonly known as: ZITHROMAX Take 1 tablet (500 mg total) by mouth daily for 3 days. Start taking on: June 04, 2023   Breztri Aerosphere 160-9-4.8 MCG/ACT Aero Generic drug: Budeson-Glycopyrrol-Formoterol Inhale 2 puffs into the lungs 2 (two) times daily.   clonazePAM 0.5 MG tablet Commonly known as: KLONOPIN TAKE 1 TABLET BY MOUTH AT BEDTIME.   diltiazem 120 MG 24 hr capsule Commonly known as: CARDIZEM CD Take 1 capsule (120 mg total) by mouth 2 (two) times daily.   dofetilide 250 MCG capsule Commonly known as: TIKOSYN Take 1 capsule (250 mcg total) by mouth 2 (two) times daily.   ferrous gluconate 324 MG tablet Commonly known as: FERGON Take 1 tablet (324 mg total) by mouth daily with breakfast. Start taking on: June 04, 2023   finasteride 5 MG tablet Commonly known as: PROSCAR Take 5 mg by mouth daily.   furosemide 20 MG tablet Commonly known as: LASIX Take 2 tablets in the morning (40 mg) and 1 tablet in the evening (20 mg) daily.   gabapentin 100 MG capsule Commonly known as: NEURONTIN Take 200 mg by mouth at bedtime.   ipratropium 0.03 % nasal  spray Commonly known as: ATROVENT Place 2 sprays into both nostrils every 12 (twelve) hours.   ipratropium-albuterol 0.5-2.5 (3) MG/3ML Soln Commonly known as: DUONEB INHALE 3 ML BY NEBULIZER EVERY 6 HOURS AS NEEDED   nitroGLYCERIN 0.4 MG SL tablet Commonly known as: NITROSTAT Place 1 tablet (0.4 mg total) under the tongue every 5 (five) minutes as needed for chest pain.   OXYGEN Inhale into the lungs. 2 liters upon exertion and 3 liters continuous at night.   pantoprazole 40 MG tablet Commonly known as: PROTONIX Take 1 tablet (40 mg total) by mouth daily.   polyethylene glycol 17 g packet Commonly known as: MIRALAX / GLYCOLAX Take 17 g by mouth daily as needed for moderate constipation.   predniSONE 10 MG tablet Commonly known as: DELTASONE Take 1 tablet (10 mg total) by mouth daily with breakfast. What changed: Another medication with the same name was removed. Continue taking this medication, and follow the directions you see here.  rosuvastatin 20 MG tablet Commonly known as: CRESTOR Take 1 tablet (20 mg total) by mouth daily.   silver sulfADIAZINE 1 % cream Commonly known as: SILVADENE Apply 1 Application topically daily.   STOOL SOFTENER PO Take 1 tablet by mouth every evening.               Discharge Care Instructions  (From admission, onward)           Start     Ordered   06/03/23 0000  Discharge wound care:       Comments: Wound care  Every other day    Comments: Cleanse R arm skin tear with saline, pat dry Cover with single layer of xeroform, top with dry dressing, secure with foam (if skin not too fragile) or with kerlix.  Change every other day   06/03/23 1211            Follow-up Information     Raechel Chute, MD Follow up.   Specialty: Pulmonary Disease Why: F/u in 1-2 weeks. Hx of COPD Contact information: [address] [place] [address] [phone]                Allergies  Allergen Reactions    Flagyl [Metronidazole] Other (See Comments)    Other reaction(s): skin reaction    Consultations:    Procedures/Studies: CT Angio Chest PE W and/or Wo Contrast  Result Date: 05/31/2023 CLINICAL DATA:  Shortness of breath for 4 days EXAM: CT ANGIOGRAPHY CHEST WITH CONTRAST TECHNIQUE: Multidetector CT imaging of the chest was performed using the standard protocol during bolus administration of intravenous contrast. Multiplanar CT image reconstructions and MIPs were obtained to evaluate the vascular anatomy. RADIATION DOSE REDUCTION: This exam was performed according to the departmental dose-optimization program which includes automated exposure control, adjustment of the mA and/or kV according to patient size and/or use of iterative reconstruction technique. CONTRAST:  75mL OMNIPAQUE IOHEXOL 350 MG/ML SOLN COMPARISON:  Chest CT dated November 07, 2022 FINDINGS: Cardiovascular: No evidence of pulmonary embolus. Normal heart size. Normal caliber thoracic aorta with moderate atherosclerotic disease. Lipomatous hypertrophy of the intra-atrial septum. Severe coronary artery calcifications. Mediastinum/Nodes: Esophagus and thyroid are unremarkable. No enlarged lymph nodes seen in the chest. Lungs/Pleura: Mild bubbly debris seen in the trachea. Mild bronchial wall thickening. Severe emphysema. Bilateral solid pulmonary nodules are stable. No consolidation, pleural effusion or pneumothorax. Upper Abdomen: Exophytic right renal cysts, no specific follow-up imaging is necessary. No acute abnormality. Musculoskeletal: No chest wall abnormality. No acute or significant osseous findings. Review of the MIP images confirms the above findings. IMPRESSION: 1. No evidence of pulmonary embolus. 2. Multiple bilateral solid pulmonary nodules, stable in size when compared with most recent prior, but decreased in size when compared with more remote priors, likely sequela of prior infection. 3. Mild bubbly debris seen in the  trachea, findings can be seen in the setting of aspiration. 4. Mild bronchial wall thickening, findings can be seen in the setting of bronchitis. 5. Aortic Atherosclerosis (ICD10-I70.0) and Emphysema (ICD10-J43.9). Electronically Signed   By: Allegra Lai M.D.   On: 05/31/2023 21:46   DG Chest Portable 1 View  Result Date: 05/31/2023 CLINICAL DATA:  Shortness of breath for 4 days. EXAM: PORTABLE CHEST 1 VIEW COMPARISON:  05/08/2023 FINDINGS: Chronic hyperinflation. Chronic bronchial thickening and emphysema. Blunting of the costophrenic angles is unchanged and likely related to hyperinflation. No evidence of acute airspace disease. Normal heart size with stable mediastinal contours. No pulmonary  edema. No pneumothorax. No acute osseous abnormalities are seen. IMPRESSION: Chronic hyperinflation, bronchial thickening, and emphysema. No acute findings. Electronically Signed   By: Narda Rutherford M.D.   On: 05/31/2023 16:49   DG Chest 2 View  Result Date: 05/31/2023 CLINICAL DATA:  Dyspnea. EXAM: CHEST - 2 VIEW COMPARISON:  08/18/2022 FINDINGS: The lungs are hyperinflated. Chronic bronchial thickening and emphysema. No evidence of focal airspace disease. Normal heart size. Stable mediastinal contours. No pleural fluid or pneumothorax. Thoracic spondylosis. No acute osseous findings. IMPRESSION: Hyperinflation with chronic bronchial thickening and emphysema. No acute findings. Electronically Signed   By: Narda Rutherford M.D.   On: 05/31/2023 16:48   LONG TERM MONITOR (3-14 DAYS)  Result Date: 05/20/2023 HR 61 - 187, average 82 bpm. 851 nonsustained SVT, longest 13 beats. 4% burden of AF/AFL, average rate 106 bpm. Frequent supraventricular ectopy, 11.3% Rare ventricular ectopy. Sheria Lang T. Lalla Brothers, MD, [facility], [facility] Cardiac Electrophysiology   (Echo, Carotid, EGD, Colonoscopy, ERCP)    Subjective: Pt c/o malaise    Discharge Exam: Vitals:   06/03/23 0726 06/03/23 0847  BP:  (!) 133/56  Pulse:   100  Resp:  20  Temp:  98.1 F (36.7 C)  SpO2: 100% 99%   Vitals:   06/03/23 0117 06/03/23 0159 06/03/23 0726 06/03/23 0847  BP: (!) 118/58   (!) 133/56  Pulse: 84   100  Resp: 16   20  Temp: 97.9 F (36.6 C)   98.1 F (36.7 C)  TempSrc: Oral   Oral  SpO2: 100% 100% 100% 99%  Weight:      Height:        General: Pt is alert, awake, not in acute distress Cardiovascular:S1/S2 +, no rubs, no gallops Respiratory: decreased breath sounds b/l  Abdominal: Soft, NT, ND, bowel sounds + Extremities: no edema, no cyanosis    The results of significant diagnostics from this hospitalization (including imaging, microbiology, ancillary and laboratory) are listed below for reference.     Microbiology: Recent Results (from the past 240 hour(s))  Resp panel by RT-PCR (RSV, Flu A&B, Covid) Anterior Nasal Swab     Status: None   Collection Time: 05/31/23  4:29 PM   Specimen: Anterior Nasal Swab  Result Value Ref Range Status   SARS Coronavirus 2 by RT PCR NEGATIVE NEGATIVE Final    Comment: (NOTE) SARS-CoV-2 target nucleic acids are NOT DETECTED.  The SARS-CoV-2 RNA is generally detectable in upper respiratory specimens during the acute phase of infection. The lowest concentration of SARS-CoV-2 viral copies this assay can detect is 138 copies/mL. A negative result does not preclude SARS-Cov-2 infection and should not be used as the sole basis for treatment or other patient management decisions. A negative result may occur with  improper specimen collection/handling, submission of specimen other than nasopharyngeal swab, presence of viral mutation(s) within the areas targeted by this assay, and inadequate number of viral copies(<138 copies/mL). A negative result must be combined with clinical observations, patient history, and epidemiological information. The expected result is Negative.  Fact Sheet for Patients:  BloggerCourse.com  Fact Sheet for  Healthcare Providers:  SeriousBroker.it  This test is no t yet approved or cleared by the [place] FDA and  has been authorized for detection and/or diagnosis of SARS-CoV-2 by FDA under an Emergency Use Authorization (EUA). This EUA will remain  in effect (meaning this test can be used) for the duration of the COVID-19 declaration under Section 564(b)(1) of the Act, 21 U.S.C.section 360bbb-3(b)(1), unless  the authorization is terminated  or revoked sooner.       Influenza A by PCR NEGATIVE NEGATIVE Final   Influenza B by PCR NEGATIVE NEGATIVE Final    Comment: (NOTE) The Xpert Xpress SARS-CoV-2/FLU/RSV plus assay is intended as an aid in the diagnosis of influenza from Nasopharyngeal swab specimens and should not be used as a sole basis for treatment. Nasal washings and aspirates are unacceptable for Xpert Xpress SARS-CoV-2/FLU/RSV testing.  Fact Sheet for Patients: BloggerCourse.com  Fact Sheet for Healthcare Providers: SeriousBroker.it  This test is not yet approved or cleared by the [place] FDA and has been authorized for detection and/or diagnosis of SARS-CoV-2 by FDA under an Emergency Use Authorization (EUA). This EUA will remain in effect (meaning this test can be used) for the duration of the COVID-19 declaration under Section 564(b)(1) of the Act, 21 U.S.C. section 360bbb-3(b)(1), unless the authorization is terminated or revoked.     Resp Syncytial Virus by PCR NEGATIVE NEGATIVE Final    Comment: (NOTE) Fact Sheet for Patients: BloggerCourse.com  Fact Sheet for Healthcare Providers: SeriousBroker.it  This test is not yet approved or cleared by the [place] FDA and has been authorized for detection and/or diagnosis of SARS-CoV-2 by FDA under an Emergency Use Authorization (EUA). This EUA will remain in effect (meaning this  test can be used) for the duration of the COVID-19 declaration under Section 564(b)(1) of the Act, 21 U.S.C. section 360bbb-3(b)(1), unless the authorization is terminated or revoked.  Performed at [facility], [address] [place], [place] [address]      Labs: BNP (last 3 results) Recent Labs    08/12/22 1227 08/30/22 1526 05/31/23 1530  BNP 61.0 26.7 60.4   Basic Metabolic Panel: Recent Labs  Lab 05/31/23 1530 06/01/23 0730 06/02/23 0533 06/03/23 0447  NA 140 137 142 143  K 4.4 4.2 4.9 4.7  CL 97* 97* 100 101  CO2 33* 32 34* 33*  GLUCOSE 200* 190* 143* 176*  BUN 37* 27* 32* 30*  CREATININE 0.96 0.66 0.93 0.89  CALCIUM 8.2* 8.1* 8.5* 8.4*   Liver Function Tests: No results for input(s): "AST", "ALT", "ALKPHOS", "BILITOT", "PROT", "ALBUMIN" in the last 168 hours. No results for input(s): "LIPASE", "AMYLASE" in the last 168 hours. No results for input(s): "AMMONIA" in the last 168 hours. CBC: Recent Labs  Lab 05/31/23 1530 06/01/23 0730 06/02/23 0533 06/03/23 0447  WBC 13.3* 8.3 10.6* 9.3  NEUTROABS 11.9*  --   --   --   HGB 12.3* 11.2* 11.1* 11.1*  HCT 36.6* 33.0* 32.8* 33.1*  MCV 92.4 90.7 91.6 93.5  PLT 136* 140* 170 178   Cardiac Enzymes: No results for input(s): "CKTOTAL", "CKMB", "CKMBINDEX", "TROPONINI" in the last 168 hours. BNP: Invalid input(s): "POCBNP" CBG: No results for input(s): "GLUCAP" in the last 168 hours. D-Dimer No results for input(s): "DDIMER" in the last 72 hours. Hgb A1c No results for input(s): "HGBA1C" in the last 72 hours. Lipid Profile No results for input(s): "CHOL", "HDL", "LDLCALC", "TRIG", "CHOLHDL", "LDLDIRECT" in the last 72 hours. Thyroid function studies No results for input(s): "TSH", "T4TOTAL", "T3FREE", "THYROIDAB" in the last 72 hours.  Invalid input(s): "FREET3" Anemia work up Recent Labs    05/31/23 1852 06/01/23 0730  VITAMINB12  --  655  FOLATE 32.0  --   FERRITIN 222  --   TIBC 231*   --   IRON 31*  --   RETICCTPCT  --  2.6   Urinalysis  Component Value Date/Time   COLORURINE YELLOW 11/16/2009 1130   APPEARANCEUR CLEAR 11/16/2009 1130   LABSPEC 1.017 11/16/2009 1130   PHURINE 7.5 11/16/2009 1130   GLUCOSEU NEGATIVE 11/16/2009 1130   HGBUR NEGATIVE 11/16/2009 1130   BILIRUBINUR NEGATIVE 11/16/2009 1130   KETONESUR NEGATIVE 11/16/2009 1130   PROTEINUR NEGATIVE 11/16/2009 1130   UROBILINOGEN 0.2 11/16/2009 1130   NITRITE NEGATIVE 11/16/2009 1130   LEUKOCYTESUR  11/16/2009 1130    NEGATIVE MICROSCOPIC NOT DONE ON URINES WITH NEGATIVE PROTEIN, BLOOD, LEUKOCYTES, NITRITE, OR GLUCOSE <1000 mg/dL.   Sepsis Labs Recent Labs  Lab 05/31/23 1530 06/01/23 0730 06/02/23 0533 06/03/23 0447  WBC 13.3* 8.3 10.6* 9.3   Microbiology Recent Results (from the past 240 hour(s))  Resp panel by RT-PCR (RSV, Flu A&B, Covid) Anterior Nasal Swab     Status: None   Collection Time: 05/31/23  4:29 PM   Specimen: Anterior Nasal Swab  Result Value Ref Range Status   SARS Coronavirus 2 by RT PCR NEGATIVE NEGATIVE Final    Comment: (NOTE) SARS-CoV-2 target nucleic acids are NOT DETECTED.  The SARS-CoV-2 RNA is generally detectable in upper respiratory specimens during the acute phase of infection. The lowest concentration of SARS-CoV-2 viral copies this assay can detect is 138 copies/mL. A negative result does not preclude SARS-Cov-2 infection and should not be used as the sole basis for treatment or other patient management decisions. A negative result may occur with  improper specimen collection/handling, submission of specimen other than nasopharyngeal swab, presence of viral mutation(s) within the areas targeted by this assay, and inadequate number of viral copies(<138 copies/mL). A negative result must be combined with clinical observations, patient history, and epidemiological information. The expected result is Negative.  Fact Sheet for Patients:   BloggerCourse.com  Fact Sheet for Healthcare Providers:  SeriousBroker.it  This test is no t yet approved or cleared by the [place] FDA and  has been authorized for detection and/or diagnosis of SARS-CoV-2 by FDA under an Emergency Use Authorization (EUA). This EUA will remain  in effect (meaning this test can be used) for the duration of the COVID-19 declaration under Section 564(b)(1) of the Act, 21 U.S.C.section 360bbb-3(b)(1), unless the authorization is terminated  or revoked sooner.       Influenza A by PCR NEGATIVE NEGATIVE Final   Influenza B by PCR NEGATIVE NEGATIVE Final    Comment: (NOTE) The Xpert Xpress SARS-CoV-2/FLU/RSV plus assay is intended as an aid in the diagnosis of influenza from Nasopharyngeal swab specimens and should not be used as a sole basis for treatment. Nasal washings and aspirates are unacceptable for Xpert Xpress SARS-CoV-2/FLU/RSV testing.  Fact Sheet for Patients: BloggerCourse.com  Fact Sheet for Healthcare Providers: SeriousBroker.it  This test is not yet approved or cleared by the [place] FDA and has been authorized for detection and/or diagnosis of SARS-CoV-2 by FDA under an Emergency Use Authorization (EUA). This EUA will remain in effect (meaning this test can be used) for the duration of the COVID-19 declaration under Section 564(b)(1) of the Act, 21 U.S.C. section 360bbb-3(b)(1), unless the authorization is terminated or revoked.     Resp Syncytial Virus by PCR NEGATIVE NEGATIVE Final    Comment: (NOTE) Fact Sheet for Patients: BloggerCourse.com  Fact Sheet for Healthcare Providers: SeriousBroker.it  This test is not yet approved or cleared by the [place] FDA and has been authorized for detection and/or diagnosis of SARS-CoV-2 by FDA under an Emergency Use  Authorization (EUA). This EUA will remain  in effect (meaning this test can be used) for the duration of the COVID-19 declaration under Section 564(b)(1) of the Act, 21 U.S.C. section 360bbb-3(b)(1), unless the authorization is terminated or revoked.  Performed at [facility], [address] [place], [place] [address]      Time coordinating discharge: Over 30 minutes  SIGNED:   Charise Killian, MD  [facility] 06/03/2023, 12:12 PM Pager   If 7PM-7AM, please contact night-coverage [email or web address]

## 2023-06-04 ENCOUNTER — Other Ambulatory Visit (HOSPITAL_COMMUNITY): Payer: Self-pay

## 2023-06-04 ENCOUNTER — Other Ambulatory Visit: Payer: Self-pay | Admitting: Urology

## 2023-06-05 DIAGNOSIS — I4811 Longstanding persistent atrial fibrillation: Secondary | ICD-10-CM | POA: Diagnosis not present

## 2023-06-05 DIAGNOSIS — I4891 Unspecified atrial fibrillation: Secondary | ICD-10-CM | POA: Diagnosis not present

## 2023-06-05 DIAGNOSIS — I2510 Atherosclerotic heart disease of native coronary artery without angina pectoris: Secondary | ICD-10-CM | POA: Diagnosis not present

## 2023-06-05 DIAGNOSIS — M6281 Muscle weakness (generalized): Secondary | ICD-10-CM | POA: Diagnosis not present

## 2023-06-05 DIAGNOSIS — J9621 Acute and chronic respiratory failure with hypoxia: Secondary | ICD-10-CM | POA: Diagnosis not present

## 2023-06-05 DIAGNOSIS — Z741 Need for assistance with personal care: Secondary | ICD-10-CM | POA: Diagnosis not present

## 2023-06-05 DIAGNOSIS — R2689 Other abnormalities of gait and mobility: Secondary | ICD-10-CM | POA: Diagnosis not present

## 2023-06-05 DIAGNOSIS — J431 Panlobular emphysema: Secondary | ICD-10-CM | POA: Diagnosis not present

## 2023-06-05 DIAGNOSIS — Z9981 Dependence on supplemental oxygen: Secondary | ICD-10-CM | POA: Diagnosis not present

## 2023-06-05 DIAGNOSIS — J441 Chronic obstructive pulmonary disease with (acute) exacerbation: Secondary | ICD-10-CM | POA: Diagnosis not present

## 2023-06-05 DIAGNOSIS — R278 Other lack of coordination: Secondary | ICD-10-CM | POA: Diagnosis not present

## 2023-06-06 ENCOUNTER — Other Ambulatory Visit: Payer: Self-pay | Admitting: Internal Medicine

## 2023-06-06 ENCOUNTER — Other Ambulatory Visit: Payer: Self-pay

## 2023-06-06 MED ORDER — AMOXICILLIN-POT CLAVULANATE 875-125 MG PO TABS: 1.0000 | ORAL_TABLET | Freq: Two times a day (BID) | ORAL | 0 refills | Status: DC

## 2023-06-07 ENCOUNTER — Ambulatory Visit: Payer: Self-pay

## 2023-06-07 DIAGNOSIS — Z9981 Dependence on supplemental oxygen: Secondary | ICD-10-CM | POA: Diagnosis not present

## 2023-06-07 DIAGNOSIS — J9621 Acute and chronic respiratory failure with hypoxia: Secondary | ICD-10-CM | POA: Diagnosis not present

## 2023-06-07 DIAGNOSIS — M6281 Muscle weakness (generalized): Secondary | ICD-10-CM | POA: Diagnosis not present

## 2023-06-07 DIAGNOSIS — I4811 Longstanding persistent atrial fibrillation: Secondary | ICD-10-CM | POA: Diagnosis not present

## 2023-06-07 DIAGNOSIS — R278 Other lack of coordination: Secondary | ICD-10-CM | POA: Diagnosis not present

## 2023-06-07 DIAGNOSIS — I4891 Unspecified atrial fibrillation: Secondary | ICD-10-CM | POA: Diagnosis not present

## 2023-06-07 DIAGNOSIS — I2510 Atherosclerotic heart disease of native coronary artery without angina pectoris: Secondary | ICD-10-CM | POA: Diagnosis not present

## 2023-06-07 DIAGNOSIS — J431 Panlobular emphysema: Secondary | ICD-10-CM | POA: Diagnosis not present

## 2023-06-07 DIAGNOSIS — J441 Chronic obstructive pulmonary disease with (acute) exacerbation: Secondary | ICD-10-CM | POA: Diagnosis not present

## 2023-06-07 DIAGNOSIS — Z741 Need for assistance with personal care: Secondary | ICD-10-CM | POA: Diagnosis not present

## 2023-06-07 DIAGNOSIS — R2689 Other abnormalities of gait and mobility: Secondary | ICD-10-CM | POA: Diagnosis not present

## 2023-06-07 NOTE — Patient Outreach (Addendum)
  Care Coordination   Follow Up Visit Note   06/17/2023 Name: [name] [name]. MRN: [id number] DOB: 28-Jul-1941  [name] [name]. is a 82 y.o. year old male who sees [name], MD for primary care. I spoke with  [name] [name]. by phone today.  What matters to the patients health and wellness today?  Per chart review patient admitted from 05/31/23 to 06/03/23 for COPD exacerbation.  Patient reports his breathing is better since being in the hospital .  He states breathing difficulty with activity. He reports using oxygen at 4 to 5 L with activity and 3 L at rest.  Patient reports using nebulizer 1-2 x per day.  Patient report having decrease in lower body strength. He states he requires occasional assistance when ambulating and uses walker. Patient states he has PT services 3 x per week at his [facility] residence.    Goals Addressed             This Visit's Progress    Management and education of chronic health conditions       Interventions Today    Flowsheet Row Most Recent Value  Chronic Disease   Chronic disease during today's visit Chronic Obstructive Pulmonary Disease (COPD)  General Interventions   General Interventions Discussed/Reviewed General Interventions Reviewed, Doctor Visits  [evaluation of current treatment plan for COPD and patients adherence to plan as established by provider.  Assessed for COPD symptoms.]  Doctor Visits Discussed/Reviewed Doctor Visits Reviewed  [facility] upcoming provider visits. Advised to keep follow up visits as recommended. Confirmed patient had follow up with primary care provider.]  Exercise Interventions   Exercise Discussed/Reviewed Physical Activity  [Discussed patients current activity level.  Confirmed patient contacted for PT services]  Education Interventions   Education Provided Provided Education  [Reviewed COPD symptoms. Advised to call 911 for severe symptoms.]  Provided Verbal Education On When to see the doctor   Pharmacy Interventions   Pharmacy Dicussed/Reviewed Pharmacy Topics Reviewed  [medications reviewed.  Advised to take medications as prescribed.  Advised to take prescribed antibiotic until completed.]              SDOH assessments and interventions completed:  No     Care Coordination Interventions:  Yes, provided   Follow up plan: Follow up call scheduled for 06/17/23     Encounter Outcome:  Patient Visit Completed   George Ina RN,BSN,CCM [facility]  [facility] coordinator / Case Manager Phone: [phone]

## 2023-06-07 NOTE — Patient Instructions (Signed)
Visit Information  Thank you for taking time to visit with me today. Please don't hesitate to contact me if I can be of assistance to you.   Following are the goals we discussed today:   Goals Addressed   None     Our next appointment is by telephone on 06/17/23 at 10:30 am  Please call the care guide team at [phone] if you need to cancel or reschedule your appointment.   If you are experiencing a Mental Health or Behavioral Health Crisis or need someone to talk to, please call the Suicide and Crisis Lifeline: 988 call 1-800-273-TALK (toll free, 24 hour hotline)  Patient verbalizes understanding of instructions and care plan provided today and agrees to view in MyChart. Active MyChart status and patient understanding of how to access instructions and care plan via MyChart confirmed with patient.     [name] Ina [name] [name],BSN,CCM [place]  [facility], [facility] coordinator / Case Manager Phone: [phone]

## 2023-06-10 DIAGNOSIS — I4811 Longstanding persistent atrial fibrillation: Secondary | ICD-10-CM | POA: Diagnosis not present

## 2023-06-10 DIAGNOSIS — J441 Chronic obstructive pulmonary disease with (acute) exacerbation: Secondary | ICD-10-CM | POA: Diagnosis not present

## 2023-06-10 DIAGNOSIS — J431 Panlobular emphysema: Secondary | ICD-10-CM | POA: Diagnosis not present

## 2023-06-10 DIAGNOSIS — M6281 Muscle weakness (generalized): Secondary | ICD-10-CM | POA: Diagnosis not present

## 2023-06-10 DIAGNOSIS — I4891 Unspecified atrial fibrillation: Secondary | ICD-10-CM | POA: Diagnosis not present

## 2023-06-10 DIAGNOSIS — R278 Other lack of coordination: Secondary | ICD-10-CM | POA: Diagnosis not present

## 2023-06-10 DIAGNOSIS — Z9981 Dependence on supplemental oxygen: Secondary | ICD-10-CM | POA: Diagnosis not present

## 2023-06-10 DIAGNOSIS — Z741 Need for assistance with personal care: Secondary | ICD-10-CM | POA: Diagnosis not present

## 2023-06-10 DIAGNOSIS — J9621 Acute and chronic respiratory failure with hypoxia: Secondary | ICD-10-CM | POA: Diagnosis not present

## 2023-06-10 DIAGNOSIS — R2689 Other abnormalities of gait and mobility: Secondary | ICD-10-CM | POA: Diagnosis not present

## 2023-06-10 DIAGNOSIS — I2510 Atherosclerotic heart disease of native coronary artery without angina pectoris: Secondary | ICD-10-CM | POA: Diagnosis not present

## 2023-06-12 DIAGNOSIS — R278 Other lack of coordination: Secondary | ICD-10-CM | POA: Diagnosis not present

## 2023-06-12 DIAGNOSIS — I4811 Longstanding persistent atrial fibrillation: Secondary | ICD-10-CM | POA: Diagnosis not present

## 2023-06-12 DIAGNOSIS — Z741 Need for assistance with personal care: Secondary | ICD-10-CM | POA: Diagnosis not present

## 2023-06-12 DIAGNOSIS — I4891 Unspecified atrial fibrillation: Secondary | ICD-10-CM | POA: Diagnosis not present

## 2023-06-12 DIAGNOSIS — R2689 Other abnormalities of gait and mobility: Secondary | ICD-10-CM | POA: Diagnosis not present

## 2023-06-12 DIAGNOSIS — J431 Panlobular emphysema: Secondary | ICD-10-CM | POA: Diagnosis not present

## 2023-06-12 DIAGNOSIS — Z9981 Dependence on supplemental oxygen: Secondary | ICD-10-CM | POA: Diagnosis not present

## 2023-06-12 DIAGNOSIS — J441 Chronic obstructive pulmonary disease with (acute) exacerbation: Secondary | ICD-10-CM | POA: Diagnosis not present

## 2023-06-12 DIAGNOSIS — I2510 Atherosclerotic heart disease of native coronary artery without angina pectoris: Secondary | ICD-10-CM | POA: Diagnosis not present

## 2023-06-12 DIAGNOSIS — J9621 Acute and chronic respiratory failure with hypoxia: Secondary | ICD-10-CM | POA: Diagnosis not present

## 2023-06-12 DIAGNOSIS — M6281 Muscle weakness (generalized): Secondary | ICD-10-CM | POA: Diagnosis not present

## 2023-06-14 DIAGNOSIS — R2689 Other abnormalities of gait and mobility: Secondary | ICD-10-CM | POA: Diagnosis not present

## 2023-06-14 DIAGNOSIS — I4811 Longstanding persistent atrial fibrillation: Secondary | ICD-10-CM | POA: Diagnosis not present

## 2023-06-14 DIAGNOSIS — I2510 Atherosclerotic heart disease of native coronary artery without angina pectoris: Secondary | ICD-10-CM | POA: Diagnosis not present

## 2023-06-14 DIAGNOSIS — I4891 Unspecified atrial fibrillation: Secondary | ICD-10-CM | POA: Diagnosis not present

## 2023-06-14 DIAGNOSIS — M6281 Muscle weakness (generalized): Secondary | ICD-10-CM | POA: Diagnosis not present

## 2023-06-14 DIAGNOSIS — J431 Panlobular emphysema: Secondary | ICD-10-CM | POA: Diagnosis not present

## 2023-06-14 DIAGNOSIS — J441 Chronic obstructive pulmonary disease with (acute) exacerbation: Secondary | ICD-10-CM | POA: Diagnosis not present

## 2023-06-14 DIAGNOSIS — J9621 Acute and chronic respiratory failure with hypoxia: Secondary | ICD-10-CM | POA: Diagnosis not present

## 2023-06-14 DIAGNOSIS — R278 Other lack of coordination: Secondary | ICD-10-CM | POA: Diagnosis not present

## 2023-06-14 DIAGNOSIS — Z741 Need for assistance with personal care: Secondary | ICD-10-CM | POA: Diagnosis not present

## 2023-06-14 DIAGNOSIS — Z9981 Dependence on supplemental oxygen: Secondary | ICD-10-CM | POA: Diagnosis not present

## 2023-06-17 ENCOUNTER — Ambulatory Visit: Payer: Self-pay

## 2023-06-17 ENCOUNTER — Telehealth: Payer: Self-pay

## 2023-06-17 DIAGNOSIS — Z9981 Dependence on supplemental oxygen: Secondary | ICD-10-CM | POA: Diagnosis not present

## 2023-06-17 DIAGNOSIS — Z741 Need for assistance with personal care: Secondary | ICD-10-CM | POA: Diagnosis not present

## 2023-06-17 DIAGNOSIS — J441 Chronic obstructive pulmonary disease with (acute) exacerbation: Secondary | ICD-10-CM | POA: Diagnosis not present

## 2023-06-17 DIAGNOSIS — R2689 Other abnormalities of gait and mobility: Secondary | ICD-10-CM | POA: Diagnosis not present

## 2023-06-17 DIAGNOSIS — M6281 Muscle weakness (generalized): Secondary | ICD-10-CM | POA: Diagnosis not present

## 2023-06-17 DIAGNOSIS — I2510 Atherosclerotic heart disease of native coronary artery without angina pectoris: Secondary | ICD-10-CM | POA: Diagnosis not present

## 2023-06-17 DIAGNOSIS — I4811 Longstanding persistent atrial fibrillation: Secondary | ICD-10-CM | POA: Diagnosis not present

## 2023-06-17 DIAGNOSIS — I4891 Unspecified atrial fibrillation: Secondary | ICD-10-CM | POA: Diagnosis not present

## 2023-06-17 DIAGNOSIS — J9621 Acute and chronic respiratory failure with hypoxia: Secondary | ICD-10-CM | POA: Diagnosis not present

## 2023-06-17 DIAGNOSIS — R278 Other lack of coordination: Secondary | ICD-10-CM | POA: Diagnosis not present

## 2023-06-17 DIAGNOSIS — J431 Panlobular emphysema: Secondary | ICD-10-CM | POA: Diagnosis not present

## 2023-06-17 NOTE — Patient Outreach (Signed)
  Care Coordination   Follow Up Visit Note   06/17/2023 Name: [name] [name]. MRN: [id number] DOB: [birth year]  [name] [name]. is a 82 y.o. year old male who sees [name], MD for primary care. I spoke with  [name] [name]. by phone today.  What matters to the patients health and wellness today?  Patient states he is doing about the same. He reports ongoing SOB with activity. Patient states he is receiving PT 3 x per week and has an onsite RN that manages the open wounds on his buttocks area.  Patient states due to wounds not healing well he is scheduled to be seen at the wound clinic on 07/23/23.  Patient states he is currently on a waiting list to get a sooner appointment.  Patient states he continues to wear his oxygen around the clock.  Reports O2 saturation dropping into the 80's with activity and returning to 90's with rest.      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 Assessed for increase in COPD symptom and sacral wound status/ care.]  Doctor Visits Discussed/Reviewed Doctor Visits Reviewed  Algis Downs to keep follow up with providers. Confirmed patients upcoming pulmonary appointment.]  Exercise Interventions   Exercise Discussed/Reviewed Physical Activity  Education Interventions   Education Provided Provided Education  [Assessed patients current activity level.  Confirmed patient receiving PT and nursing care. Discussed COPD symptoms. Advised to notiify provider for increase  in symptoms. Call 911 for severe symptoms.]  Provided Verbal Education On Other  [discussed signs/ symptoms of infection.]  Pharmacy Interventions   Pharmacy Dicussed/Reviewed Pharmacy Topics Reviewed              SDOH assessments and interventions completed:  No     Care Coordination Interventions:  Yes, provided   Follow up plan: Follow up call scheduled for 07/29/23    Encounter Outcome:  Patient Visit Completed   George Ina RN,BSN,CCM [facility]  [facility], [facility] coordinator / Case Manager Phone: [phone]

## 2023-06-17 NOTE — Patient Outreach (Signed)
  Care Coordination   06/17/2023 Name: [name] [name]. MRN: [id number] DOB: 26-Aug-1940   Care Coordination Outreach Attempts:  An unsuccessful telephone outreach was attempted for a scheduled appointment today.  Contact answering phone states patient is not at home.  HIPAA compliant message left with return call phone number.   Follow Up Plan:  Additional outreach attempts will be made to offer the patient care coordination information and services.   Encounter Outcome:  Request return call .   Care Coordination Interventions:  No, not indicated    George Ina RN,BSN,CCM [facility]  [facility], [facility] coordinator / Case Manager Phone: [phone]

## 2023-06-17 NOTE — Patient Instructions (Signed)
Visit Information  Thank you for taking time to visit with me today. Please don't hesitate to contact me if I can be of assistance to you.   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 Assessed for increase in COPD symptom and sacral wound status/ care.]  Doctor Visits Discussed/Reviewed Doctor Visits Reviewed  Algis Downs to keep follow up with providers. Confirmed patients upcoming pulmonary appointment.]  Exercise Interventions   Exercise Discussed/Reviewed Physical Activity  Education Interventions   Education Provided Provided Education  [Assessed patients current activity level.  Confirmed patient receiving PT and nursing care. Discussed COPD symptoms. Advised to notiify provider for increase in symptoms. Call 911 for severe symptoms.]  Provided Verbal Education On Other  [discussed signs/ symptoms of infection.]  Pharmacy Interventions   Pharmacy Dicussed/Reviewed Pharmacy Topics Reviewed              Our next appointment is by telephone on 07/29/23 at 3 pm  Please call the care guide team at [phone] if you need to cancel or reschedule your appointment.   If you are experiencing a Mental Health or Behavioral Health Crisis or need someone to talk to, please call the Suicide and Crisis Lifeline: 988 call 1-800-273-TALK (toll free, 24 hour hotline)  Patient verbalizes understanding of instructions and care plan provided today and agrees to view in  MyChart. Active MyChart status and patient understanding of how to access instructions and care plan via MyChart confirmed with patient.     George Ina RN,BSN,CCM [place]  [facility], [facility] coordinator / Case Manager Phone: [phone]

## 2023-06-19 DIAGNOSIS — R278 Other lack of coordination: Secondary | ICD-10-CM | POA: Diagnosis not present

## 2023-06-19 DIAGNOSIS — Z9981 Dependence on supplemental oxygen: Secondary | ICD-10-CM | POA: Diagnosis not present

## 2023-06-19 DIAGNOSIS — I4891 Unspecified atrial fibrillation: Secondary | ICD-10-CM | POA: Diagnosis not present

## 2023-06-19 DIAGNOSIS — I2510 Atherosclerotic heart disease of native coronary artery without angina pectoris: Secondary | ICD-10-CM | POA: Diagnosis not present

## 2023-06-19 DIAGNOSIS — J441 Chronic obstructive pulmonary disease with (acute) exacerbation: Secondary | ICD-10-CM | POA: Diagnosis not present

## 2023-06-19 DIAGNOSIS — M6281 Muscle weakness (generalized): Secondary | ICD-10-CM | POA: Diagnosis not present

## 2023-06-19 DIAGNOSIS — Z741 Need for assistance with personal care: Secondary | ICD-10-CM | POA: Diagnosis not present

## 2023-06-19 DIAGNOSIS — R2689 Other abnormalities of gait and mobility: Secondary | ICD-10-CM | POA: Diagnosis not present

## 2023-06-19 DIAGNOSIS — I4811 Longstanding persistent atrial fibrillation: Secondary | ICD-10-CM | POA: Diagnosis not present

## 2023-06-19 DIAGNOSIS — J431 Panlobular emphysema: Secondary | ICD-10-CM | POA: Diagnosis not present

## 2023-06-19 DIAGNOSIS — J9621 Acute and chronic respiratory failure with hypoxia: Secondary | ICD-10-CM | POA: Diagnosis not present

## 2023-06-21 DIAGNOSIS — Z9981 Dependence on supplemental oxygen: Secondary | ICD-10-CM | POA: Diagnosis not present

## 2023-06-21 DIAGNOSIS — J441 Chronic obstructive pulmonary disease with (acute) exacerbation: Secondary | ICD-10-CM | POA: Diagnosis not present

## 2023-06-21 DIAGNOSIS — J9621 Acute and chronic respiratory failure with hypoxia: Secondary | ICD-10-CM | POA: Diagnosis not present

## 2023-06-21 DIAGNOSIS — I4811 Longstanding persistent atrial fibrillation: Secondary | ICD-10-CM | POA: Diagnosis not present

## 2023-06-21 DIAGNOSIS — J431 Panlobular emphysema: Secondary | ICD-10-CM | POA: Diagnosis not present

## 2023-06-21 DIAGNOSIS — R278 Other lack of coordination: Secondary | ICD-10-CM | POA: Diagnosis not present

## 2023-06-21 DIAGNOSIS — M6281 Muscle weakness (generalized): Secondary | ICD-10-CM | POA: Diagnosis not present

## 2023-06-21 DIAGNOSIS — R2689 Other abnormalities of gait and mobility: Secondary | ICD-10-CM | POA: Diagnosis not present

## 2023-06-21 DIAGNOSIS — Z741 Need for assistance with personal care: Secondary | ICD-10-CM | POA: Diagnosis not present

## 2023-06-21 DIAGNOSIS — I2510 Atherosclerotic heart disease of native coronary artery without angina pectoris: Secondary | ICD-10-CM | POA: Diagnosis not present

## 2023-06-21 DIAGNOSIS — I4891 Unspecified atrial fibrillation: Secondary | ICD-10-CM | POA: Diagnosis not present

## 2023-06-24 DIAGNOSIS — J9621 Acute and chronic respiratory failure with hypoxia: Secondary | ICD-10-CM | POA: Diagnosis not present

## 2023-06-24 DIAGNOSIS — R2689 Other abnormalities of gait and mobility: Secondary | ICD-10-CM | POA: Diagnosis not present

## 2023-06-24 DIAGNOSIS — I2510 Atherosclerotic heart disease of native coronary artery without angina pectoris: Secondary | ICD-10-CM | POA: Diagnosis not present

## 2023-06-24 DIAGNOSIS — M6281 Muscle weakness (generalized): Secondary | ICD-10-CM | POA: Diagnosis not present

## 2023-06-24 DIAGNOSIS — R278 Other lack of coordination: Secondary | ICD-10-CM | POA: Diagnosis not present

## 2023-06-24 DIAGNOSIS — J431 Panlobular emphysema: Secondary | ICD-10-CM | POA: Diagnosis not present

## 2023-06-24 DIAGNOSIS — I4811 Longstanding persistent atrial fibrillation: Secondary | ICD-10-CM | POA: Diagnosis not present

## 2023-06-24 DIAGNOSIS — J441 Chronic obstructive pulmonary disease with (acute) exacerbation: Secondary | ICD-10-CM | POA: Diagnosis not present

## 2023-06-24 DIAGNOSIS — Z741 Need for assistance with personal care: Secondary | ICD-10-CM | POA: Diagnosis not present

## 2023-06-24 DIAGNOSIS — I4891 Unspecified atrial fibrillation: Secondary | ICD-10-CM | POA: Diagnosis not present

## 2023-06-24 DIAGNOSIS — Z9981 Dependence on supplemental oxygen: Secondary | ICD-10-CM | POA: Diagnosis not present

## 2023-06-26 ENCOUNTER — Ambulatory Visit: Payer: Medicare Other | Admitting: Student in an Organized Health Care Education/Training Program

## 2023-06-26 ENCOUNTER — Encounter: Payer: Self-pay | Admitting: Student in an Organized Health Care Education/Training Program

## 2023-06-26 VITALS — BP 114/58 | HR 96 | Temp 98.0°F | Ht 70.5 in | Wt 153.8 lb

## 2023-06-26 DIAGNOSIS — J398 Other specified diseases of upper respiratory tract: Secondary | ICD-10-CM | POA: Diagnosis not present

## 2023-06-26 DIAGNOSIS — J441 Chronic obstructive pulmonary disease with (acute) exacerbation: Secondary | ICD-10-CM | POA: Diagnosis not present

## 2023-06-26 DIAGNOSIS — J9611 Chronic respiratory failure with hypoxia: Secondary | ICD-10-CM | POA: Diagnosis not present

## 2023-06-26 DIAGNOSIS — R918 Other nonspecific abnormal finding of lung field: Secondary | ICD-10-CM

## 2023-06-26 DIAGNOSIS — J432 Centrilobular emphysema: Secondary | ICD-10-CM | POA: Diagnosis not present

## 2023-06-26 MED ORDER — SODIUM CHLORIDE 3 % IN NEBU: INHALATION_SOLUTION | Freq: Two times a day (BID) | RESPIRATORY_TRACT | 12 refills | Status: DC

## 2023-06-26 MED ORDER — PREDNISONE 5 MG PO TABS: 10.0000 mg | ORAL_TABLET | Freq: Every day | ORAL | 11 refills | Status: DC

## 2023-06-26 NOTE — Progress Notes (Signed)
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 He had hyperinflation (TLC 131% predicted) and air trapping (RV/TLC at 160% predicted). DLCO is severely reduced at 38% predicted. This is overall consistent with GOLD 3E COPD with recurrent exacerbations maintained on triple therapy LABA/LAMA/ICS Markus Daft) and chronically on steroids (10 mg of prednisone daily). In light of his severe COPD and respiratory failure, he is maintained on oxygen via nasal cannula but is not on non-invasive positive pressure ventilation. Furthermore, I have reviewed Mr. Letourneau's multiple chest CT's, including his most recent CT performed while admitted to [facility]. I note malacia in the airways, especially the trachea and most notably in the right middle lobe bronchus. This scan is not a dynamic airway CT and the fact that it captured malacia suggests this problem is worse than is seen on the CT.  Discussed management of COPD with Mr. Vanleeuwen. He is maintained on appropriate triple therapy for COPD, but is also maintained on prednisone chronically which I would like him to taper off. I discussed the potential long term side effects of prednisone with Mr. Phaneuf. 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 I would also like to initiate him on a pulmonary clearance regimen given his EDAC to help him expectorate secretions. This will include use of hypertonic saline, followed by albuterol, both by nebulizer. He will then use his flutter device and then incentive  spirometer. He will do this twice daily.  As for future options should he continue to have recurrent exacerbations, I would consider daily azithromycin, as well as consider initiation of biologic therapy for recurrent COPD exacerbations. Other options would include enfisentrine.  Finally, given chronic hypercapnia as well as EDAC, I think he might benefit from nocturnal BiPAP which would manage the hypercapnia but also help stent open the airways while he is asleep. He was not very excited about starting this today and we will re-visit this during his next visit.   - sodium chloride HYPERTONIC 3 % nebulizer solution; Take by nebulization in the morning and at bedtime.  Dispense: 750 mL; Refill: 12 - predniSONE (DELTASONE) 5 MG tablet; Take 2 tablets (10 mg total) by mouth daily with breakfast.  Dispense: 60 tablet; Refill: 11 - continue oxygen via nasal cannula 2-3 L/min - continue Breztri - pulmonary clearance regimen as described above  #Pulmonary Nodules  Has had multiple CT scans of the chest for follow up of pulmonary nodules, as well as multiple biopsies that have shown benign findings. His most recent chest CT showed the nodules to be stable. I will cease radiographic monitoring of said nodules.  #HFpEF #Afib  History of afib and heart failure, followed closely by cardiology. He has significant lower extremity pitting edema suggestive of right sided heart failure. This could also be secondary to elevated pulmonary vascular resistance in the setting of COPD and chronic hypoxia. Counseled on salt avoidance, use of diuretics. Patient to follow with cardiology.   Return in about 2 months (around 08/26/2023).  I spent 45 minutes caring for this patient today, including preparing to  see the patient, obtaining a medical history , reviewing a separately obtained history, performing a medically appropriate examination and/or evaluation, counseling and educating the patient/family/caregiver,  ordering medications, tests, or procedures, documenting clinical information in the electronic health record, independently interpreting results (not separately reported/billed) and communicating results to the patient/family/caregiver, and care coordination (not separately reported/billed). This visit is part of longitudinal care for the patient's multiple chronic medical conditions.  Raechel Chute, MD  Pulmonary Critical Care 06/26/2023 7:37 PM    End of visit medications:  Meds ordered this encounter  Medications   sodium chloride HYPERTONIC 3 % nebulizer solution    Sig: Take by nebulization in the morning and at bedtime.    Dispense:  750 mL    Refill:  12   predniSONE (DELTASONE) 5 MG tablet    Sig: Take 2 tablets (10 mg total) by mouth daily with breakfast.    Dispense:  60 tablet    Refill:  11     Current Outpatient Medications:    acetaminophen (TYLENOL) 325 MG tablet, Take 650 mg by mouth every 4 (four) hours as needed., Disp: , Rfl:    albuterol (PROVENTIL) (2.5 MG/3ML) 0.083% nebulizer solution, Take 2.5 mg by nebulization 2 (two) times daily., Disp: , Rfl:    albuterol (VENTOLIN HFA) 108 (90 Base) MCG/ACT inhaler, Inhale 2 puffs into the lungs 3 (three) times daily as needed for wheezing or shortness of breath., Disp: , Rfl:    apixaban (ELIQUIS) 5 MG TABS tablet, Take 1 tablet (5 mg total) by mouth 2 (two) times daily., Disp: 180 tablet, Rfl: 3   Budeson-Glycopyrrol-Formoterol (BREZTRI AEROSPHERE) 160-9-4.8 MCG/ACT AERO, Inhale 2 puffs into the lungs 2 (two) times daily., Disp: 10.7 g, Rfl: 6   clonazePAM (KLONOPIN) 0.5 MG tablet, TAKE 1 TABLET BY MOUTH AT BEDTIME., Disp: 30 tablet, Rfl: 0   diltiazem (CARDIZEM CD) 120 MG 24 hr capsule, Take 1 capsule (120 mg total) by mouth 2 (two) times daily., Disp: 180 capsule, Rfl: 3   Docusate Calcium (STOOL SOFTENER PO), Take 1 tablet by mouth every evening., Disp: , Rfl:    dofetilide (TIKOSYN) 250 MCG capsule, Take 1  capsule (250 mcg total) by mouth 2 (two) times daily., Disp: 180 capsule, Rfl: 2   finasteride (PROSCAR) 5 MG tablet, Take 5 mg by mouth daily., Disp: , Rfl:    furosemide (LASIX) 20 MG tablet, Take 2 tablets in the morning (40 mg) and 1 tablet in the evening (20 mg) daily., Disp: 360 tablet, Rfl: 1   gabapentin (NEURONTIN) 100 MG capsule, Take 200 mg by mouth at bedtime., Disp: , Rfl:    ipratropium (ATROVENT) 0.03 % nasal spray, Place 2 sprays into both nostrils every 12 (twelve) hours., Disp: 30 mL, Rfl: 12   ipratropium-albuterol (DUONEB) 0.5-2.5 (3) MG/3ML SOLN, INHALE 3 ML BY NEBULIZER EVERY 6 HOURS AS NEEDED, Disp: 360 mL, Rfl: 1   nitroGLYCERIN (NITROSTAT) 0.4 MG SL tablet, Place 1 tablet (0.4 mg total) under the tongue every 5 (five) minutes as needed for chest pain., Disp: 25 tablet, Rfl: 4   OXYGEN, Inhale into the lungs. 2 liters upon exertion and 3 liters continuous at night., Disp: , Rfl:    pantoprazole (PROTONIX) 40 MG tablet, Take 1 tablet (40 mg total) by mouth daily., Disp: 90 tablet, Rfl: 3   polyethylene glycol (MIRALAX / GLYCOLAX) 17 g packet, Take 17 g by mouth daily as needed for moderate constipation., Disp: , Rfl:    predniSONE (DELTASONE) 5  MG tablet, Take 2 tablets (10 mg total) by mouth daily with breakfast., Disp: 60 tablet, Rfl: 11   rosuvastatin (CRESTOR) 20 MG tablet, Take 1 tablet (20 mg total) by mouth daily., Disp: 90 tablet, Rfl: 3   silver sulfADIAZINE (SILVADENE) 1 % cream, Apply 1 Application topically daily., Disp: 50 g, Rfl: 0   sodium chloride HYPERTONIC 3 % nebulizer solution, Take by nebulization in the morning and at bedtime., Disp: 750 mL, Rfl: 12   Subjective:   PATIENT ID: [name] [name]. GENDER: male DOB: Aug 13, 1940, MRN: [id number]  Chief Complaint  Patient presents with   Follow-up    Occasional cough and wheezing. Shortness of breath.     HPI  Patient is a pleasant 82 year old male with a past medical history of COPD previously  followed by Dr. Delton Coombes who presents to clinic to transition care. He currently lives in [place] and requested to transfer his care to our [place] location.  Patient was previously followed by Dr. Delton Coombes, last saw him on 10/19/2022. He has a history of COPD, with chronic hypoxic respiratory failure maintained on 2-3 L via nasal cannula. He has also been followed by Dr. Delton Coombes for pulmonary nodular disease requiring serial CT scans and bronchoscopies with nodular biopsies, without an established diagnosis. Some of the biopsies had shown granulomatous inflammation. Multiple CT scans had shown waxing and waning pulmonary nodular disease. Previous lung biopsies are from 2017 (3 targets, benign lung parenchyma with mild fibrosis and anthracosis), 09/2019 (benign cytology, noncaseating granulomatous inflammation from RLL biopsy), and 03/2020 (negative cytology and pathology).  He is maintained on Breztri for his COPD, and is also using duo-nebs and albuterol via nebulizer. Patient is chronically maintained on prednisone therapy, currently at 10 mg orally once daily. He reports being on prednisone for years.  Today, he reports a cough that is productive of sputum, as well as chronic dyspnea. He is short of breath at rest. He is very limited in his ability to ambulate secondary to his COPD. He denies chest pain. He reports wheezing. He has lower extremity edema that is chronic. He is also reporting ankle and foot pain.  Patient was recently admitted to the hospital on 05/31/2023 secondary to increased shortness of breath with increased sputum production. He was admitted for acute on chronic hypoxic respiratory failure requiring IV steroids, IV antibiotics, and nebulizers. He felt better after his discharge and his symptoms are back to baseline. Previously also admitted in January of 2024 secondary to acute on chronic hypoxic and hypercapnic respiratory failure due to RSV infection.  The patient's other medical  conditions include HFpEF, Afib, and CAD s/p PCI. He is maintained on apixaban, dofetilide, digoxin, furosemide, gabapentin, and rosuvastatin. He is also chronically on prednisone 10 mg daily and is also on gabapentin.  He has a long standing history of smoking, quit 3 years ago. He has at least 50 pack years of smoking history. He used to work in Air[employer] and denies occupational exposures.  Ancillary information including prior medications, full medical/surgical/family/social histories, and PFTs (when available) are listed below and have been reviewed.   Review of Systems  Constitutional:  Negative for chills, fever, malaise/fatigue and weight loss.  Respiratory:  Positive for cough, sputum production, shortness of breath and wheezing. Negative for hemoptysis.   Cardiovascular:  Negative for chest pain.     Objective:   Vitals:   06/26/23 0945  BP: (!) 114/58  Pulse: 96  Temp: 98 F (36.7 C)  TempSrc:  Temporal  SpO2: 96%  Weight: 153 lb 12.8 oz (69.8 kg)  Height: 5' 10.5" (1.791 m)   96% on RA  BMI Readings from Last 3 Encounters:  06/26/23 21.76 kg/m  05/31/23 22.49 kg/m  05/08/23 22.35 kg/m   Wt Readings from Last 3 Encounters:  06/26/23 153 lb 12.8 oz (69.8 kg)  05/31/23 159 lb (72.1 kg)  05/08/23 158 lb (71.7 kg)    Physical Exam Constitutional:      General: He is not in acute distress.    Appearance: He is ill-appearing.  Cardiovascular:     Rate and Rhythm: Normal rate. Rhythm irregular.     Pulses: Normal pulses.     Heart sounds: Normal heart sounds.  Pulmonary:     Effort: No respiratory distress.     Breath sounds: Wheezing and rales present. No rhonchi.  Abdominal:     Palpations: Abdomen is soft.  Neurological:     General: No focal deficit present.     Mental Status: He is alert and oriented to person, place, and time. Mental status is at baseline.       Ancillary Information    Past Medical History:  Diagnosis Date   Abdominal discomfort  12/07/2020   Acquired trigger finger of right middle finger 02/04/2020   Acute prostatitis 04/08/2018   Alcohol abuse    Alcohol dependence (HCC) 12/07/2020   Anal or rectal pain 04/08/2018   Annual physical exam 12/07/2020   Anorectal disorder 12/07/2020   Arthritis    Atherosclerotic heart disease of native coronary artery without angina pectoris 04/08/2018   a. 03/2018 PCI: LM nl, LAD 50m, LCX nl, OM1 70p (2.75x8 Synergy DES), 34m (2.5x24 Synergy DES), RCA 25p, RPL1 50. EF 55-65%.   Atrial flutter (HCC)    a. 07/2022 in setting of hospitalization for resp illness/RSV. CHA2DS2VASc = 4-->eliquis; b. 08/2022 s/p DCCV (100J); c. 09/26/2022 Recurrent Aflutter.   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 02/2022 Carotid U/S: 1-39% bilat ICA stenoses.   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   Contracture of palmar fascia 12/07/2020   COPD (chronic obstructive pulmonary disease) (HCC)    Corn of toe 12/07/2020   Corns and callosities 04/09/2018   Coronary artery disease    2019 with stents   Coronary atherosclerosis 05/07/2018   Degenerative disc disease, 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   Degenerative disc disease, 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   Diarrhea 04/09/2018   Double vision with both eyes open 12/07/2020   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   Dysphagia 08/14/2018   Dyspnea    ED (erectile dysfunction)    ED (erectile dysfunction) of organic origin 12/07/2020   Elevated prostate specific antigen (PSA) 04/08/2018   Elevated PSA    Encounter  for screening for other disorder 12/07/2020   Enlarged prostate without lower urinary tract symptoms (luts) 04/09/2018   Enthesopathy 12/07/2020   Essential (primary) hypertension 04/08/2018   Ex-smoker 04/09/2018   Exertional dyspnea 04/02/2018   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 07/2022 Echo: EF 65-70%, no rwma, mild LVH, nl RV fxn.   Hyperlipidemia    Hypertension    Hypoxemia 12/07/2020   Kidney cysts    Left knee pain 12/07/2020   Low back pain 04/08/2018   Male erectile dysfunction 04/09/2018   Mixed hyperlipidemia 04/08/2018   Nocturia more than twice per night 04/09/2018   Osteoarthritis of first carpometacarpal joint 04/08/2018   Osteoarthritis of knee 12/07/2020   Other long term (current) drug therapy 12/07/2020   Pain due to onychomycosis of toenail of left foot 07/20/2019   Pain in joint 04/09/2018   Pain in knee 04/09/2018   Pain in unspecified knee 12/07/2020   Pain of finger 04/09/2018   Palpitations    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HCC) 12/07/2020   Personal history of colonic polyps 12/07/2020   Pneumonia    Pneumothorax 04/12/2020   Polypharmacy 04/09/2018   Porokeratosis 01/13/2019   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HCC)    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   Pulmonary nodules 06/08/2016   Pure hypercholesterolemia 12/07/2020   Renal cyst 12/07/2020   Sleep disorder 04/08/2018   Status post bronchoscopy 04/12/2020   Tear of rotator cuff 04/09/2018   Tobacco user 12/07/2020   Uncomplicated alcohol dependence (HCC) 04/09/2018   Unspecified rotator cuff tear or rupture of unspecified shoulder, not specified as traumatic 12/07/2020   Unspecified tear of unspecified meniscus, current injury, unspecified knee, initial encounter 12/07/2020   Visual  disturbance 12/07/2020   Vitamin D deficiency 04/08/2018     Family History  Problem Relation Age of Onset   Hypertension Mother    Alzheimer's disease Mother      Past Surgical History:  Procedure Laterality Date   BRONCHIAL BIOPSY  10/13/2019   Procedure: BRONCHIAL BIOPSIES;  Surgeon: Leslye Peer, MD;  Location: [facility] ENDOSCOPY;  Service: Pulmonary;;   BRONCHIAL BIOPSY  04/12/2020   Procedure: BRONCHIAL BIOPSIES;  Surgeon: Leslye Peer, MD;  Location: [facility] ENDOSCOPY;  Service: Pulmonary;;   BRONCHIAL BRUSHINGS  10/13/2019   Procedure: BRONCHIAL BRUSHINGS;  Surgeon: Leslye Peer, MD;  Location: [facility] ENDOSCOPY;  Service: Pulmonary;;   BRONCHIAL BRUSHINGS  04/12/2020   Procedure: BRONCHIAL BRUSHINGS;  Surgeon: Leslye Peer, MD;  Location: [facility] ENDOSCOPY;  Service: Pulmonary;;   BRONCHIAL NEEDLE ASPIRATION BIOPSY  10/13/2019   Procedure: BRONCHIAL NEEDLE ASPIRATION BIOPSIES;  Surgeon: Leslye Peer, MD;  Location: [facility] ENDOSCOPY;  Service: Pulmonary;;   BRONCHIAL NEEDLE ASPIRATION BIOPSY  04/12/2020   Procedure: BRONCHIAL NEEDLE ASPIRATION BIOPSIES;  Surgeon: Leslye Peer, MD;  Location: [facility] ENDOSCOPY;  Service: Pulmonary;;   BRONCHIAL WASHINGS  10/13/2019   Procedure: BRONCHIAL WASHINGS;  Surgeon: Leslye Peer, MD;  Location: [facility] ENDOSCOPY;  Service: Pulmonary;;   BRONCHIAL WASHINGS  04/12/2020   Procedure: BRONCHIAL WASHINGS;  Surgeon: Leslye Peer, MD;  Location: [facility] ENDOSCOPY;  Service: Pulmonary;;   CARDIAC CATHETERIZATION  2002   50% RCA   CARDIOVERSION N/A 09/17/2022   Procedure: CARDIOVERSION;  Surgeon: Iran Ouch, MD;  Location: [facility] ORS;  Service: Cardiovascular;  Laterality: N/A;   CATARACT EXTRACTION, BILATERAL     CORONARY STENT INTERVENTION N/A 04/15/2018   Procedure: CORONARY STENT INTERVENTION;  Surgeon: Corky Crafts, MD;  Location: [facility] INVASIVE CV LAB;  Service: Cardiovascular;  Laterality: N/A;  om1   EYE SURGERY     KNEE ARTHROSCOPY     LEFT  HEART  CATH AND CORONARY ANGIOGRAPHY N/A 04/15/2018   Procedure: LEFT HEART CATH AND CORONARY ANGIOGRAPHY;  Surgeon: Corky Crafts, MD;  Location: [facility] INVASIVE CV LAB;  Service: Cardiovascular;  Laterality: N/A;   MULTIPLE TOOTH EXTRACTIONS     TONSILLECTOMY     VIDEO BRONCHOSCOPY  04/12/2020   VIDEO BRONCHOSCOPY WITH ENDOBRONCHIAL NAVIGATION N/A 07/26/2016   Procedure: VIDEO BRONCHOSCOPY WITH ENDOBRONCHIAL NAVIGATION;  Surgeon: Leslye Peer, MD;  Location: [facility] OR;  Service: Thoracic;  Laterality: N/A;   VIDEO BRONCHOSCOPY WITH ENDOBRONCHIAL NAVIGATION N/A 10/13/2019   Procedure: [facility] AND VIDEO BRONCHOSCOPY WITH ENDOBRONCHIAL NAVIGATION;  Surgeon: Leslye Peer, MD;  Location: [facility] ENDOSCOPY;  Service: Pulmonary;  Laterality: N/A;   VIDEO BRONCHOSCOPY WITH ENDOBRONCHIAL NAVIGATION N/A 04/12/2020   Procedure: VIDEO BRONCHOSCOPY WITH ENDOBRONCHIAL NAVIGATION;  Surgeon: Leslye Peer, MD;  Location: [facility] ENDOSCOPY;  Service: Pulmonary;  Laterality: N/A;    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  Tobacco Use   Smoking status: Former    Current packs/day: 0.00    Average packs/day: 0.8 packs/day for 68.0 years (51.0 ttl pk-yrs)    Types: Cigarettes    Start date: 63    Quit date: 07/13/2020    Years since quitting: 2.9    Passive exposure: Never   Smokeless tobacco: Never   Tobacco comments:    Recent Quit    Vaping Use   Vaping status: Former  Substance and Sexual Activity   Alcohol use: No    Alcohol/week: 0.0 standard drinks of alcohol    Comment: quit drinking 2014   Drug use: No   Sexual activity: Not on file  Other Topics Concern   Not on file  Social History Narrative   Has living will    Wife is health care POA--alternate is son Jeanice Lim)   Has daughter in [place]   Has DNR   No tube feeds if cognitively unaware   Social Determinants of Health   Financial Resource Strain: Low Risk  (01/06/2023)   Overall Financial Resource Strain (CARDIA)    Difficulty of Paying Living Expenses: Not hard at all  Food Insecurity: No Food Insecurity (06/01/2023)   Hunger Vital Sign    Worried About Running Out of Food in the Last Year: Never true    Ran Out of Food in the Last Year: Never true  Transportation Needs: No Transportation Needs (06/01/2023)   PRAPARE - Administrator, Civil Service (Medical): No    Lack of Transportation (Non-Medical): No  Physical Activity: Sufficiently Active (01/06/2023)   Exercise Vital Sign    Days of Exercise per Week: 4 days    Minutes of Exercise per Session: 40 min  Stress: No Stress Concern Present (01/06/2023)   [employer] of Occupational Health - Occupational Stress Questionnaire    Feeling of Stress : Not at all  Social Connections: Unknown (01/06/2023)   Social Connection and Isolation Panel [NHANES]    Frequency of Communication with Friends and Family: Never    Frequency of Social Gatherings with Friends and Family: Once a week    Attends Religious Services: More than 4 times per year  Active Member of Clubs or Organizations: Not on file    Attends Club or Organization Meetings: Not on file    Marital Status: Married  Intimate Partner Violence: Not At Risk (06/01/2023)   Humiliation, Afraid, Rape, and Kick questionnaire    Fear of Current or Ex-Partner: No    Emotionally Abused: No    Physically Abused: No    Sexually Abused: No     Allergies  Allergen Reactions   Flagyl [Metronidazole] Other (See Comments)    Other reaction(s): skin reaction     CBC    Component Value Date/Time   WBC 9.3 06/03/2023 0447   RBC 3.54 (L) 06/03/2023 0447   HGB 11.1 (L) 06/03/2023 0447   HGB 15.3 05/02/2023 1445   HCT 33.1  (L) 06/03/2023 0447   HCT 46.9 05/02/2023 1445   PLT 178 06/03/2023 0447   PLT 226 05/02/2023 1445   MCV 93.5 06/03/2023 0447   MCV 94 05/02/2023 1445   MCH 31.4 06/03/2023 0447   MCHC 33.5 06/03/2023 0447   RDW 14.8 06/03/2023 0447   RDW 13.1 05/02/2023 1445   LYMPHSABS 0.5 (L) 05/31/2023 1530   LYMPHSABS 1.4 04/09/2018 1650   MONOABS 0.2 05/31/2023 1530   EOSABS 0.0 05/31/2023 1530   EOSABS 0.2 04/09/2018 1650   BASOSABS 0.0 05/31/2023 1530   BASOSABS 0.1 04/09/2018 1650    Pulmonary Functions Testing Results:    Latest Ref Rng & Units 05/02/2018    2:45 PM  PFT Results  FVC-Pre L 2.50   FVC-Predicted Pre % 68   FVC-Post L 2.52   FVC-Predicted Post % 68   Pre FEV1/FVC % % 37   Post FEV1/FCV % % 40   FEV1-Pre L 0.92   FEV1-Predicted Pre % 35   FEV1-Post L 1.02   DLCO uncorrected ml/min/mmHg 11.04   DLCO UNC% % 38   DLCO corrected ml/min/mmHg 10.72   DLCO COR %Predicted % 37   DLVA Predicted % 45   TLC L 8.50   TLC % Predicted % 131   RV % Predicted % 213     Outpatient Medications Prior to Visit  Medication Sig Dispense Refill   acetaminophen (TYLENOL) 325 MG tablet Take 650 mg by mouth every 4 (four) hours as needed.     albuterol (PROVENTIL) (2.5 MG/3ML) 0.083% nebulizer solution Take 2.5 mg by nebulization 2 (two) times daily.     albuterol (VENTOLIN HFA) 108 (90 Base) MCG/ACT inhaler Inhale 2 puffs into the lungs 3 (three) times daily as needed for wheezing or shortness of breath.     apixaban (ELIQUIS) 5 MG TABS tablet Take 1 tablet (5 mg total) by mouth 2 (two) times daily. 180 tablet 3   Budeson-Glycopyrrol-Formoterol (BREZTRI AEROSPHERE) 160-9-4.8 MCG/ACT AERO Inhale 2 puffs into the lungs 2 (two) times daily. 10.7 g 6   clonazePAM (KLONOPIN) 0.5 MG tablet TAKE 1 TABLET BY MOUTH AT BEDTIME. 30 tablet 0   diltiazem (CARDIZEM CD) 120 MG 24 hr capsule Take 1 capsule (120 mg total) by mouth 2 (two) times daily. 180 capsule 3   Docusate Calcium (STOOL SOFTENER  PO) Take 1 tablet by mouth every evening.     dofetilide (TIKOSYN) 250 MCG capsule Take 1 capsule (250 mcg total) by mouth 2 (two) times daily. 180 capsule 2   finasteride (PROSCAR) 5 MG tablet Take 5 mg by mouth daily.     furosemide (LASIX) 20 MG tablet Take 2 tablets in the morning (40 mg) and 1 tablet  in the evening (20 mg) daily. 360 tablet 1   gabapentin (NEURONTIN) 100 MG capsule Take 200 mg by mouth at bedtime.     ipratropium (ATROVENT) 0.03 % nasal spray Place 2 sprays into both nostrils every 12 (twelve) hours. 30 mL 12   ipratropium-albuterol (DUONEB) 0.5-2.5 (3) MG/3ML SOLN INHALE 3 ML BY NEBULIZER EVERY 6 HOURS AS NEEDED 360 mL 1   nitroGLYCERIN (NITROSTAT) 0.4 MG SL tablet Place 1 tablet (0.4 mg total) under the tongue every 5 (five) minutes as needed for chest pain. 25 tablet 4   OXYGEN Inhale into the lungs. 2 liters upon exertion and 3 liters continuous at night.     pantoprazole (PROTONIX) 40 MG tablet Take 1 tablet (40 mg total) by mouth daily. 90 tablet 3   polyethylene glycol (MIRALAX / GLYCOLAX) 17 g packet Take 17 g by mouth daily as needed for moderate constipation.     rosuvastatin (CRESTOR) 20 MG tablet Take 1 tablet (20 mg total) by mouth daily. 90 tablet 3   silver sulfADIAZINE (SILVADENE) 1 % cream Apply 1 Application topically daily. 50 g 0   predniSONE (DELTASONE) 10 MG tablet Take 1 tablet (10 mg total) by mouth daily with breakfast. 90 tablet 3   amoxicillin-clavulanate (AUGMENTIN) 875-125 MG tablet Take 1 tablet by mouth 2 (two) times daily. (Patient not taking: Reported on 06/26/2023) 14 tablet 0   ferrous gluconate (FERGON) 324 MG tablet Take 1 tablet (324 mg total) by mouth daily with breakfast. (Patient not taking: Reported on 06/26/2023) 30 tablet 0   No facility-administered medications prior to visit.

## 2023-06-26 NOTE — Patient Instructions (Addendum)
We will start a pulmonary clearance regimen that will include the following:  -Start with using the hypertonic saline by nebulizer -Then follow the albuterol by nebulizer -Then use the flutter device: please do at least 10-15 blows into the device for at least 23 seconds each -And finish up with using the incentive spirometer; use for at least 10 times -After doing these, attempt to cough.  Please do these twice a day  Use breztri with a spacer device  We will taper your prednisone slowly, we will taper your dose slowly  Do 7.5 mg once daily (1.5 tablets) for the next two months Then drop your prednisone to 5 mg (1 tablet), for the following two months Then alternate prednisone 5 mg (1 tablet) with 2.5 mg (1/2 tablet) for the following two months Then drop your prednisone to 2.5 mg (1/2 tablet) for the following two months Then take 2.5 mg (1/2 tablet) every other day for 1 month Then stop your prednisone

## 2023-06-30 ENCOUNTER — Other Ambulatory Visit: Payer: Self-pay | Admitting: Internal Medicine

## 2023-07-01 ENCOUNTER — Telehealth: Payer: Self-pay | Admitting: Podiatry

## 2023-07-01 DIAGNOSIS — I4811 Longstanding persistent atrial fibrillation: Secondary | ICD-10-CM | POA: Diagnosis not present

## 2023-07-01 DIAGNOSIS — I2510 Atherosclerotic heart disease of native coronary artery without angina pectoris: Secondary | ICD-10-CM | POA: Diagnosis not present

## 2023-07-01 DIAGNOSIS — J441 Chronic obstructive pulmonary disease with (acute) exacerbation: Secondary | ICD-10-CM | POA: Diagnosis not present

## 2023-07-01 DIAGNOSIS — Z9981 Dependence on supplemental oxygen: Secondary | ICD-10-CM | POA: Diagnosis not present

## 2023-07-01 DIAGNOSIS — R278 Other lack of coordination: Secondary | ICD-10-CM | POA: Diagnosis not present

## 2023-07-01 DIAGNOSIS — R2689 Other abnormalities of gait and mobility: Secondary | ICD-10-CM | POA: Diagnosis not present

## 2023-07-01 DIAGNOSIS — J9621 Acute and chronic respiratory failure with hypoxia: Secondary | ICD-10-CM | POA: Diagnosis not present

## 2023-07-01 DIAGNOSIS — J431 Panlobular emphysema: Secondary | ICD-10-CM | POA: Diagnosis not present

## 2023-07-01 DIAGNOSIS — Z741 Need for assistance with personal care: Secondary | ICD-10-CM | POA: Diagnosis not present

## 2023-07-01 DIAGNOSIS — I4891 Unspecified atrial fibrillation: Secondary | ICD-10-CM | POA: Diagnosis not present

## 2023-07-01 DIAGNOSIS — M6281 Muscle weakness (generalized): Secondary | ICD-10-CM | POA: Diagnosis not present

## 2023-07-01 NOTE — Telephone Encounter (Signed)
Pt lvm on 11/28 @ 1147am stating he needed to be seen tomorrow for significant heel pain.   I returned call and left message for pt to call to schedule an appt.

## 2023-07-03 DIAGNOSIS — J431 Panlobular emphysema: Secondary | ICD-10-CM | POA: Diagnosis not present

## 2023-07-03 DIAGNOSIS — J441 Chronic obstructive pulmonary disease with (acute) exacerbation: Secondary | ICD-10-CM | POA: Diagnosis not present

## 2023-07-03 DIAGNOSIS — Z9981 Dependence on supplemental oxygen: Secondary | ICD-10-CM | POA: Diagnosis not present

## 2023-07-03 DIAGNOSIS — M6281 Muscle weakness (generalized): Secondary | ICD-10-CM | POA: Diagnosis not present

## 2023-07-03 DIAGNOSIS — R2689 Other abnormalities of gait and mobility: Secondary | ICD-10-CM | POA: Diagnosis not present

## 2023-07-03 DIAGNOSIS — I4891 Unspecified atrial fibrillation: Secondary | ICD-10-CM | POA: Diagnosis not present

## 2023-07-03 DIAGNOSIS — I2510 Atherosclerotic heart disease of native coronary artery without angina pectoris: Secondary | ICD-10-CM | POA: Diagnosis not present

## 2023-07-03 DIAGNOSIS — I4811 Longstanding persistent atrial fibrillation: Secondary | ICD-10-CM | POA: Diagnosis not present

## 2023-07-03 DIAGNOSIS — R278 Other lack of coordination: Secondary | ICD-10-CM | POA: Diagnosis not present

## 2023-07-03 DIAGNOSIS — Z741 Need for assistance with personal care: Secondary | ICD-10-CM | POA: Diagnosis not present

## 2023-07-03 DIAGNOSIS — J9621 Acute and chronic respiratory failure with hypoxia: Secondary | ICD-10-CM | POA: Diagnosis not present

## 2023-07-04 ENCOUNTER — Ambulatory Visit: Payer: Medicare Other | Admitting: Cardiology

## 2023-07-04 ENCOUNTER — Other Ambulatory Visit: Payer: Self-pay | Admitting: Internal Medicine

## 2023-07-04 ENCOUNTER — Ambulatory Visit: Payer: Medicare Other | Admitting: Podiatry

## 2023-07-04 DIAGNOSIS — J431 Panlobular emphysema: Secondary | ICD-10-CM

## 2023-07-04 MED ORDER — CLONAZEPAM 0.5 MG PO TABS: 0.5000 mg | ORAL_TABLET | Freq: Every day | ORAL | 0 refills | Status: DC

## 2023-07-04 NOTE — Telephone Encounter (Signed)
Prescription Request  07/04/2023  LOV: 04/16/2023  What is the name of the medication or equipment?  clonazePAM (KLONOPIN) 0.5 MG tablet  Have you contacted your pharmacy to request a refill? Yes   Which pharmacy would you like this sent to?  [id number] [address] [place] [address] Phone: [phone] Fax: [phone]     Patient notified that their request is being sent to the clinical staff for review and that they should receive a response within 2 business days.   Please advise at Mobile [phone] (mobile)

## 2023-07-05 DIAGNOSIS — J441 Chronic obstructive pulmonary disease with (acute) exacerbation: Secondary | ICD-10-CM | POA: Diagnosis not present

## 2023-07-05 DIAGNOSIS — I4811 Longstanding persistent atrial fibrillation: Secondary | ICD-10-CM | POA: Diagnosis not present

## 2023-07-05 DIAGNOSIS — R2689 Other abnormalities of gait and mobility: Secondary | ICD-10-CM | POA: Diagnosis not present

## 2023-07-05 DIAGNOSIS — I2510 Atherosclerotic heart disease of native coronary artery without angina pectoris: Secondary | ICD-10-CM | POA: Diagnosis not present

## 2023-07-05 DIAGNOSIS — I4891 Unspecified atrial fibrillation: Secondary | ICD-10-CM | POA: Diagnosis not present

## 2023-07-05 DIAGNOSIS — Z741 Need for assistance with personal care: Secondary | ICD-10-CM | POA: Diagnosis not present

## 2023-07-05 DIAGNOSIS — J9621 Acute and chronic respiratory failure with hypoxia: Secondary | ICD-10-CM | POA: Diagnosis not present

## 2023-07-05 DIAGNOSIS — Z9981 Dependence on supplemental oxygen: Secondary | ICD-10-CM | POA: Diagnosis not present

## 2023-07-05 DIAGNOSIS — R278 Other lack of coordination: Secondary | ICD-10-CM | POA: Diagnosis not present

## 2023-07-05 DIAGNOSIS — M6281 Muscle weakness (generalized): Secondary | ICD-10-CM | POA: Diagnosis not present

## 2023-07-05 DIAGNOSIS — J431 Panlobular emphysema: Secondary | ICD-10-CM | POA: Diagnosis not present

## 2023-07-08 DIAGNOSIS — J9621 Acute and chronic respiratory failure with hypoxia: Secondary | ICD-10-CM | POA: Diagnosis not present

## 2023-07-08 DIAGNOSIS — I4891 Unspecified atrial fibrillation: Secondary | ICD-10-CM | POA: Diagnosis not present

## 2023-07-08 DIAGNOSIS — J441 Chronic obstructive pulmonary disease with (acute) exacerbation: Secondary | ICD-10-CM | POA: Diagnosis not present

## 2023-07-08 DIAGNOSIS — Z741 Need for assistance with personal care: Secondary | ICD-10-CM | POA: Diagnosis not present

## 2023-07-08 DIAGNOSIS — J431 Panlobular emphysema: Secondary | ICD-10-CM | POA: Diagnosis not present

## 2023-07-08 DIAGNOSIS — I4811 Longstanding persistent atrial fibrillation: Secondary | ICD-10-CM | POA: Diagnosis not present

## 2023-07-08 DIAGNOSIS — Z9981 Dependence on supplemental oxygen: Secondary | ICD-10-CM | POA: Diagnosis not present

## 2023-07-08 DIAGNOSIS — I2510 Atherosclerotic heart disease of native coronary artery without angina pectoris: Secondary | ICD-10-CM | POA: Diagnosis not present

## 2023-07-08 DIAGNOSIS — M6281 Muscle weakness (generalized): Secondary | ICD-10-CM | POA: Diagnosis not present

## 2023-07-08 DIAGNOSIS — R278 Other lack of coordination: Secondary | ICD-10-CM | POA: Diagnosis not present

## 2023-07-08 DIAGNOSIS — R2689 Other abnormalities of gait and mobility: Secondary | ICD-10-CM | POA: Diagnosis not present

## 2023-07-11 ENCOUNTER — Encounter: Payer: Self-pay | Admitting: Podiatry

## 2023-07-11 ENCOUNTER — Ambulatory Visit: Payer: Medicare Other | Admitting: Podiatry

## 2023-07-11 DIAGNOSIS — M79674 Pain in right toe(s): Secondary | ICD-10-CM

## 2023-07-11 DIAGNOSIS — B351 Tinea unguium: Secondary | ICD-10-CM

## 2023-07-11 DIAGNOSIS — D689 Coagulation defect, unspecified: Secondary | ICD-10-CM

## 2023-07-11 DIAGNOSIS — M79675 Pain in left toe(s): Secondary | ICD-10-CM

## 2023-07-11 NOTE — Progress Notes (Signed)
  Subjective:  Patient ID: [name]., male    DOB: [birth year],  MRN: [id number]  82 y.o. male presents to clinic with  at risk foot care with h/o coagulation defect and painful thick toenails that are difficult to trim. Pain interferes with ambulation. Aggravating factors include wearing enclosed shoe gear. Pain is relieved with periodic professional debridement.   He has h/o ulcer left 2nd digit which remains healed.  He does have some foot swelling chronically and manages with diuretics.  Patient also has h/o plantar fasciitis and had questions regarding that today as well. He is accompanied by his wife on today's visit.  PCP is [name], MD.  Allergies  Allergen Reactions   Flagyl [Metronidazole] Other (See Comments)    Other reaction(s): skin reaction    Review of Systems: Negative except as noted in the HPI.   Objective:  [name]. is a pleasant 82 y.o. male WD, WN in NAD. AAO x 3. Patient on supplemental O2 via nasal cannula (O2 concentrator).  Vascular Examination: Vascular status intact b/l with nonpalpable pedal pulses due to +2 pitting edema b/l feet/ankles. CFT immediate b/l. No pain with calf compression b/l.  Lower extremity skin temperature gradient warm to cool.  Neurological Examination: Sensation grossly intact b/l with 10 gram monofilament. Vibratory sensation intact b/l.   Dermatological Examination: Pedal skin thin and atrophic b/l LE. Band-aid noted below knee RLE and on lateral aspect of left thigh. Healing abrasion noted distal tip of left great toe. Left 2nd digit ulcer remains healed. Toenails 1-5 b/l elongated, discolored, dystrophic, thickened, crumbly with subungual debris and tenderness to dorsal palpation. No corns, calluses nor porokeratotic lesions noted.  Musculoskeletal Examination: Muscle strength 5/5 to b/l LE. Hammertoe deformity noted 2-5 b/l. Utilizes transport chair for ambulation assistance.  Radiographs:  None  Last A1c:      Latest Ref Rng & Units 08/13/2022    1:29 AM  Hemoglobin A1C  Hemoglobin-A1c 4.8 - 5.6 % 5.0     Assessment:   1. Pain due to onychomycosis of toenails of both feet   2. Coagulation disorder (HCC)    Plan:  -Patient did speak to Dr. Allena Katz regarding his plantar fasciitis of both feet. Dr. Allena Katz educated patient/wife on stretching exercises. -Toenails 1-5 b/l were debrided in length and girth with sterile nail nippers and dremel without iatrogenic bleeding. Patient states Podiatry is now available at [facility] and he will see Podiatrist there. -For pedal swelling, I recommended house slippers for patients with edema which can be purchased on [employer]. -Patient/POA to call should there be question/concern in the interim.  Return if symptoms worsen or fail to improve.  Freddie Breech, DPM      [place] LOCATION: [address], [place] [address]                   Office [phone]   [facility] LOCATION: [address] [place], [place] [address] Office [phone]

## 2023-07-12 ENCOUNTER — Encounter: Payer: Self-pay | Admitting: Cardiology

## 2023-07-12 ENCOUNTER — Ambulatory Visit: Payer: Medicare Other | Attending: Cardiology | Admitting: Cardiology

## 2023-07-12 VITALS — BP 102/64 | HR 94 | Ht 70.5 in | Wt 155.0 lb

## 2023-07-12 DIAGNOSIS — R2689 Other abnormalities of gait and mobility: Secondary | ICD-10-CM | POA: Diagnosis not present

## 2023-07-12 DIAGNOSIS — M6281 Muscle weakness (generalized): Secondary | ICD-10-CM | POA: Diagnosis not present

## 2023-07-12 DIAGNOSIS — I491 Atrial premature depolarization: Secondary | ICD-10-CM

## 2023-07-12 DIAGNOSIS — J9621 Acute and chronic respiratory failure with hypoxia: Secondary | ICD-10-CM | POA: Diagnosis not present

## 2023-07-12 DIAGNOSIS — I4811 Longstanding persistent atrial fibrillation: Secondary | ICD-10-CM | POA: Diagnosis not present

## 2023-07-12 DIAGNOSIS — I4819 Other persistent atrial fibrillation: Secondary | ICD-10-CM

## 2023-07-12 DIAGNOSIS — R278 Other lack of coordination: Secondary | ICD-10-CM | POA: Diagnosis not present

## 2023-07-12 DIAGNOSIS — R600 Localized edema: Secondary | ICD-10-CM | POA: Diagnosis not present

## 2023-07-12 DIAGNOSIS — Z79899 Other long term (current) drug therapy: Secondary | ICD-10-CM | POA: Diagnosis not present

## 2023-07-12 DIAGNOSIS — J441 Chronic obstructive pulmonary disease with (acute) exacerbation: Secondary | ICD-10-CM | POA: Diagnosis not present

## 2023-07-12 DIAGNOSIS — D6869 Other thrombophilia: Secondary | ICD-10-CM | POA: Diagnosis not present

## 2023-07-12 DIAGNOSIS — J431 Panlobular emphysema: Secondary | ICD-10-CM | POA: Diagnosis not present

## 2023-07-12 DIAGNOSIS — Z741 Need for assistance with personal care: Secondary | ICD-10-CM | POA: Diagnosis not present

## 2023-07-12 DIAGNOSIS — I2510 Atherosclerotic heart disease of native coronary artery without angina pectoris: Secondary | ICD-10-CM | POA: Diagnosis not present

## 2023-07-12 DIAGNOSIS — I4891 Unspecified atrial fibrillation: Secondary | ICD-10-CM | POA: Diagnosis not present

## 2023-07-12 DIAGNOSIS — Z9981 Dependence on supplemental oxygen: Secondary | ICD-10-CM | POA: Diagnosis not present

## 2023-07-12 MED ORDER — FUROSEMIDE 20 MG PO TABS: 40.0000 mg | ORAL_TABLET | ORAL | 3 refills | Status: DC

## 2023-07-12 NOTE — Patient Instructions (Signed)
Medication Instructions:  Your physician has recommended you make the following change in your medication:   Take Furosemide 40 mg for 4 days   *If you need a refill on your cardiac medications before your next appointment, please call your pharmacy*   Lab Work: None  If you have labs (blood work) drawn today and your tests are completely normal, you will receive your results only by: MyChart Message (if you have MyChart) OR A paper copy in the mail If you have any lab test that is abnormal or we need to change your treatment, we will call you to review the results.   Testing/Procedures: None   Follow-Up: At [facility], you and your health needs are our priority.  As part of our continuing mission to provide you with exceptional heart care, we have created designated Provider Care Teams.  These Care Teams include your primary Cardiologist (physician) and Advanced Practice Providers (APPs -  Physician Assistants and Nurse Practitioners) who all work together to provide you with the care you need, when you need it.   Your next appointment:   3-4 month(s)  Provider:   Steffanie Dunn, MD or Sherie Don, NP

## 2023-07-12 NOTE — Progress Notes (Signed)
Cardiology Office Note Date:  07/12/2023  Patient ID:  [name] Cronan., DOB [birth year], MRN [id number] PCP:  [name], MD  Cardiologist:  Lorine Bears, MD Electrophysiologist: Lanier Prude, MD    Chief Complaint: SOB  History of Present Illness: [name] Sancho. is a 82 y.o. male with PMH notable for persis Afib, HFpEF, CAD s/p PCI, COPD on home Os; seen today for Lanier Prude, MD for routine EP follow-up.   He has been maintaining sinus rhythm on tikosyn BID, started 02/2023.  I saw him 04/2023 for acute visit for SOB. His PT had noticed an irregular heart beat and significantly decreased strength in lower extremities. EKG at the visit showed PACs but no afib. Zio monitor ordered at that time showed elevated SVT and PAC burden, low afib burden.  He was subsequently hospitalized 05/2023 for COPD exacerbation. He has followed up with Pulm outpatient with plans for slow steroid taper  On follow-up today, he is doing well from a heart rhythm perspective. He denies palpitations, chest pain, chest pressure. He does not think he has had any afib episodes. He continues to struggle with his breathing/SOB, but it is stable. His main complaint today is increased edema in feet and abd. He is only able to wear adjustable strap sandals. He has noticed increased abd girth where his pants are difficult to button but not today. Denies weeping. Good appetite. Continues to work with PT 3x weekly.  Dilitently takes tikosyn BID, no missed doses Diligently takes eliquis BID, no missed doses. He does have frequent small nose bleeds, uses humidified oxygen     AAD History: Tikosyn   ROS:  Please see the history of present illness. All other systems are reviewed and otherwise negative.   PHYSICAL EXAM:  VS:  BP 102/64   Pulse 94   Ht 5' 10.5" (1.791 m)   Wt 155 lb (70.3 kg)   BMI 21.93 kg/m  BMI: Body mass index is 21.93 kg/m.  Vitals:   07/12/23 1316 07/12/23 1400   BP: (!) 98/40 102/64  Pulse: 94   Height: 5' 10.5" (1.791 m)   Weight: 155 lb (70.3 kg)   BMI (Calculated): 21.92      GEN- The patient is well appearing, alert and oriented x 3 today.   Lungs- inspiratory wheezing throughout, normal work of breathing.  Heart- Regular rate with ectopy, no murmurs, rubs or gallops Extremities- 1-2+ peripheral edema up to bilat knees, warm, dry   EKG is ordered. Personal review of EKG from today shows:   EKG Interpretation Date/Time:  Friday July 12 2023 13:24:48 EST Ventricular Rate:  94 PR Interval:  158 QRS Duration:  72 QT Interval:  374 QTC Calculation: 467 R Axis:   82  Text Interpretation: Sinus rhythm with frequent Premature supraventricular complexes Confirmed by [name] [name] [phone]) on 07/12/2023 1:34:04 PM    05/02/2023 - SR w PACs QT 400; QTC 478  04/05/2023 - NSR, rate 79 QT 406; QTC 465   Recent Labs: 08/12/2022: TSH 0.700 09/22/2022: ALT 15 05/02/2023: Magnesium 2.6 05/31/2023: B Natriuretic Peptide 60.4 06/03/2023: BUN 30; Creatinine, Ser 0.89; Hemoglobin 11.1; Platelets 178; Potassium 4.7; Sodium 143  08/13/2022: Cholesterol 141; HDL 80; LDL Cholesterol 52; Total CHOL/HDL Ratio 1.8; Triglycerides 46; VLDL 9   CrCl cannot be calculated (Patient's most recent lab result is older than the maximum 21 days allowed.).   Wt Readings from Last 3 Encounters:  07/12/23 155 lb (70.3 kg)  06/26/23 153 lb 12.8 oz (69.8 kg)  05/31/23 159 lb (72.1 kg)     Additional studies reviewed include: Previous EP, cardiology notes.   Long term monitor, 05/17/23 HR 61 - 187, average 82 bpm. 851 nonsustained SVT, longest 13 beats. 4% burden of AF/AFL, average rate 106 bpm. Frequent supraventricular ectopy, 11.3% Rare ventricular ectopy.  TTE, 08/06/2022  1. Tachycardia noted. Left ventricular ejection fraction, by estimation, is 65 to 70%. The left ventricle has normal function. The left ventricle has no regional wall motion abnormalities.  There is mild left ventricular hypertrophy. Left ventricular diastolic parameters are indeterminate. There is the interventricular septum is flattened in systole and diastole, consistent with right ventricular pressure and volume overload.   2. Right ventricular systolic function is normal. The right ventricular size is normal. Tricuspid regurgitation signal is inadequate for assessing PA pressure.   3. The mitral valve is normal in structure. No evidence of mitral valve regurgitation. No evidence of mitral stenosis.   4. The aortic valve is normal in structure. Aortic valve regurgitation is not visualized. No aortic stenosis is present.   5. The inferior vena cava is normal in size with greater than 50% respiratory variability, suggesting right atrial pressure of 3 mmHg.   LHC, 04/15/2018 Prox RCA lesion is 25% stenosed. 1st RPLB lesion is 50% stenosed. THis is a very small vessel. Mid LAD lesion is 25% stenosed. 1st Mrg lesion is 90% stenosed. A drug-eluting stent was successfully placed using a STENT SYNERGY DES 2.5X24. Post intervention, there is a 0% residual stenosis. Ost 1st Mrg lesion is 70% stenosed. A drug-eluting stent was successfully placed using a STENT SYNERGY DES 2.75X8. Post intervention, there is a 0% residual stenosis. The left ventricular systolic function is normal. LV end diastolic pressure is normal. The left ventricular ejection fraction is 55-65% by visual estimate. There is no aortic valve stenosis.  ASSESSMENT AND PLAN:  #) persis Afib #) PAC Tolerating tikosyn BID EKG with stable intervals 11% SVT ectopy on most recent monitor Do not favor increasing diltiazem d/t borderline BP in office  Recent BMT with stable kidney function, stable K   #) Hypercoag d/t persis afib CHA2DS2-VASc Score = 4 [CHF History: 0, HTN History: 1, Diabetes History: 0, Stroke History: 0, Vascular Disease History: 1, Age Score: 2, Gender Score: 0].  Therefore, the patient's  annual risk of stroke is 4.8 %.    Stroke ppx - 5mg  eliquis BID, appropriately dosed No bleeding concerns  #) HFpEF #) Hypotension #) increased lower extremity edema Increased lower extremity edema Increase lasix to 40mg  x 4 days in a row Then resume 40/20 lasix alternating days Query whether increased prednisone is the cause of increased edema       Current medicines are reviewed at length with the patient today.   The patient has concerns regarding his medicines.  The following changes were made today:  none  Labs/ tests ordered today include:  Orders Placed This Encounter  Procedures   EKG 12-Lead     Disposition: Follow up with Dr. Lalla Brothers or EP APP in 3 months   Signed, [name] Don, NP  07/12/23  3:18 PM  Electrophysiology [facility]

## 2023-07-15 ENCOUNTER — Ambulatory Visit: Payer: Medicare Other | Admitting: Internal Medicine

## 2023-07-15 ENCOUNTER — Encounter: Payer: Self-pay | Admitting: Internal Medicine

## 2023-07-15 VITALS — BP 96/60 | HR 76 | Temp 98.4°F | Ht 70.5 in | Wt 155.0 lb

## 2023-07-15 DIAGNOSIS — I2510 Atherosclerotic heart disease of native coronary artery without angina pectoris: Secondary | ICD-10-CM | POA: Diagnosis not present

## 2023-07-15 DIAGNOSIS — J431 Panlobular emphysema: Secondary | ICD-10-CM | POA: Diagnosis not present

## 2023-07-15 DIAGNOSIS — R739 Hyperglycemia, unspecified: Secondary | ICD-10-CM | POA: Diagnosis not present

## 2023-07-15 DIAGNOSIS — N183 Chronic kidney disease, stage 3 unspecified: Secondary | ICD-10-CM | POA: Diagnosis not present

## 2023-07-15 DIAGNOSIS — J449 Chronic obstructive pulmonary disease, unspecified: Secondary | ICD-10-CM | POA: Diagnosis not present

## 2023-07-15 DIAGNOSIS — C61 Malignant neoplasm of prostate: Secondary | ICD-10-CM

## 2023-07-15 DIAGNOSIS — J9621 Acute and chronic respiratory failure with hypoxia: Secondary | ICD-10-CM | POA: Diagnosis not present

## 2023-07-15 DIAGNOSIS — Z0000 Encounter for general adult medical examination without abnormal findings: Secondary | ICD-10-CM

## 2023-07-15 DIAGNOSIS — I480 Paroxysmal atrial fibrillation: Secondary | ICD-10-CM

## 2023-07-15 DIAGNOSIS — J441 Chronic obstructive pulmonary disease with (acute) exacerbation: Secondary | ICD-10-CM | POA: Diagnosis not present

## 2023-07-15 DIAGNOSIS — M6281 Muscle weakness (generalized): Secondary | ICD-10-CM | POA: Diagnosis not present

## 2023-07-15 DIAGNOSIS — I4891 Unspecified atrial fibrillation: Secondary | ICD-10-CM | POA: Diagnosis not present

## 2023-07-15 DIAGNOSIS — Z9981 Dependence on supplemental oxygen: Secondary | ICD-10-CM | POA: Diagnosis not present

## 2023-07-15 DIAGNOSIS — Z741 Need for assistance with personal care: Secondary | ICD-10-CM | POA: Diagnosis not present

## 2023-07-15 DIAGNOSIS — R278 Other lack of coordination: Secondary | ICD-10-CM | POA: Diagnosis not present

## 2023-07-15 DIAGNOSIS — R2689 Other abnormalities of gait and mobility: Secondary | ICD-10-CM | POA: Diagnosis not present

## 2023-07-15 DIAGNOSIS — I4811 Longstanding persistent atrial fibrillation: Secondary | ICD-10-CM | POA: Diagnosis not present

## 2023-07-15 LAB — RENAL FUNCTION PANEL
Albumin: 3.8 g/dL (ref 3.5–5.2)
BUN: 26 mg/dL — ABNORMAL HIGH (ref 6–23)
CO2: 34 meq/L — ABNORMAL HIGH (ref 19–32)
Calcium: 9.2 mg/dL (ref 8.4–10.5)
Chloride: 101 meq/L (ref 96–112)
Creatinine, Ser: 1.01 mg/dL (ref 0.40–1.50)
GFR: 69.36 mL/min (ref >60.00)
Glucose, Bld: 90 mg/dL (ref 70–99)
Phosphorus: 3.6 mg/dL (ref 2.3–4.6)
Potassium: 4.8 meq/L (ref 3.5–5.1)
Sodium: 144 meq/L (ref 135–145)

## 2023-07-15 LAB — CBC
HCT: 38.3 % — ABNORMAL LOW (ref 39.0–52.0)
Hemoglobin: 12.4 g/dL — ABNORMAL LOW (ref 13.0–17.0)
MCHC: 32.2 g/dL (ref 30.0–36.0)
MCV: 98 fL (ref 78.0–100.0)
Platelets: 360 10*3/uL (ref 150.0–400.0)
RBC: 3.91 Mil/uL — ABNORMAL LOW (ref 4.22–5.81)
RDW: 17.4 % — ABNORMAL HIGH (ref 11.5–15.5)
WBC: 13.4 10*3/uL — ABNORMAL HIGH (ref 4.0–10.5)

## 2023-07-15 LAB — PSA: PSA: 4.89 ng/mL — ABNORMAL HIGH (ref 0.10–4.00)

## 2023-07-15 LAB — HEMOGLOBIN A1C: Hgb A1c MFr Bld: 4.8 % (ref 4.6–6.5)

## 2023-07-15 NOTE — Assessment & Plan Note (Signed)
Has been regular on tikosyn Continues on eliquis 5 bid

## 2023-07-15 NOTE — Assessment & Plan Note (Signed)
Severe and limiting On prednisone 10 and breztri as well as nebs

## 2023-07-15 NOTE — Progress Notes (Signed)
Subjective:    Patient ID: [name] Register., male    DOB: [birth year], 82 y.o.   MRN: [id number]  HPI Here for Medicare wellness visit and follow up of chronic health conditions Reviewed advanced directives Reviewed other doctors---Dr Dgayli--pulmonary, Dr Ledell Peoples, Dr Judithe Modest, Dr Sarajane Jews cardiology, Dr Callie Fielding surgery, [facility]--opto, Dr Haywood Pao, Dr Stoioff--urology Hospitalized last month for COPD (after being weakened by COVID infection)----and also in January---followed by rehab stay (had RSV) Had cardioversion for atrial fib by Dr Rolm Bookbinder February Still getting PT 3 days per week--mostly lower body work. Does upper body weights on other days Still not able to walk uch No surgery this year No alcohol or tobacco Notes some double vision in the morning--then it improves after about an hour Hearing is not great for high frequency---evaluated and told hearing aides won't help No falls Some depressed mood---mostly not able to enjoy things with his limitations Wife does instrumental ADLs Mild memory issues---relates to his breathing and hypoxia  Breathing is variable Bad at times---then can improve--even within one day Breztri---and 2 neb treatments per Dr Aundria Rud Oxygen all the time Not coughing regularly--but some mucus (uses flutter device)  Cardioversion worked only briefly After he recurred--has been put on tikosyn Now back regular On eliquis  GFR has been lower Last 56-43  Has skin ulcer in buttock crack And going to wound care at hospital---but former bed sores otherwise cleared No ulcers on foot now  Was diagnosed with prostate cancer by biopsy 1/23 Decision made not to do Rx  Current Outpatient Medications on File Prior to Visit  Medication Sig Dispense Refill   acetaminophen (TYLENOL) 325 MG tablet Take 650 mg by mouth every 4 (four) hours as needed.     albuterol (VENTOLIN HFA) 108 (90 Base) MCG/ACT inhaler INHALE  2 PUFFS 2-3 TIMES A DAY AS NEEDED FOR WHEEZING (30 DAYS) 30 DAY(S) 8.5 each 3   apixaban (ELIQUIS) 5 MG TABS tablet Take 1 tablet (5 mg total) by mouth 2 (two) times daily. 180 tablet 3   Budeson-Glycopyrrol-Formoterol (BREZTRI AEROSPHERE) 160-9-4.8 MCG/ACT AERO Inhale 2 puffs into the lungs 2 (two) times daily. 10.7 g 6   clonazePAM (KLONOPIN) 0.5 MG tablet Take 1 tablet (0.5 mg total) by mouth at bedtime. 30 tablet 0   diltiazem (CARDIZEM CD) 120 MG 24 hr capsule Take 1 capsule (120 mg total) by mouth 2 (two) times daily. 180 capsule 3   Docusate Calcium (STOOL SOFTENER PO) Take 1 tablet by mouth every evening.     dofetilide (TIKOSYN) 250 MCG capsule Take 1 capsule (250 mcg total) by mouth 2 (two) times daily. 180 capsule 2   finasteride (PROSCAR) 5 MG tablet Take 5 mg by mouth daily.     furosemide (LASIX) 20 MG tablet Take 2 tablets (40 mg total) by mouth as directed. Take 2 tablets in the morning (40 mg) for 4 days. 180 tablet 3   gabapentin (NEURONTIN) 100 MG capsule Take 200 mg by mouth at bedtime.     ipratropium (ATROVENT) 0.03 % nasal spray Place 2 sprays into both nostrils every 12 (twelve) hours. 30 mL 12   ipratropium-albuterol (DUONEB) 0.5-2.5 (3) MG/3ML SOLN INHALE 3 ML BY NEBULIZER EVERY 6 HOURS AS NEEDED 360 mL 1   nitroGLYCERIN (NITROSTAT) 0.4 MG SL tablet Place 1 tablet (0.4 mg total) under the tongue every 5 (five) minutes as needed for chest pain. 25 tablet 4   OXYGEN Inhale into the lungs. 2 liters upon exertion and 3 liters  continuous at night.     pantoprazole (PROTONIX) 40 MG tablet Take 1 tablet (40 mg total) by mouth daily. 90 tablet 3   polyethylene glycol (MIRALAX / GLYCOLAX) 17 g packet Take 17 g by mouth daily as needed for moderate constipation.     predniSONE (DELTASONE) 5 MG tablet Take 2 tablets (10 mg total) by mouth daily with breakfast. (Patient taking differently: Take 7.5 mg by mouth daily with breakfast.) 60 tablet 11   rosuvastatin (CRESTOR) 20 MG tablet  Take 1 tablet (20 mg total) by mouth daily. 90 tablet 3   silver sulfADIAZINE (SILVADENE) 1 % cream Apply 1 Application topically daily. 50 g 0   sodium chloride HYPERTONIC 3 % nebulizer solution Take by nebulization in the morning and at bedtime. 750 mL 12   No current facility-administered medications on file prior to visit.    Allergies  Allergen Reactions   Flagyl [Metronidazole] Other (See Comments)    Other reaction(s): skin reaction    Past Medical History:  Diagnosis Date   Abdominal discomfort 12/07/2020   Acquired trigger finger of right middle finger 02/04/2020   Acute prostatitis 04/08/2018   Alcohol abuse    Alcohol dependence (HCC) 12/07/2020   Anal or rectal pain 04/08/2018   Annual physical exam 12/07/2020   Anorectal disorder 12/07/2020   Arthritis    Atherosclerotic heart disease of native coronary artery without angina pectoris 04/08/2018   a. 03/2018 PCI: LM nl, LAD 77m, LCX nl, OM1 70p (2.75x8 Synergy DES), 69m (2.5x24 Synergy DES), RCA 25p, RPL1 50. EF 55-65%.   Atrial flutter (HCC)    a. 07/2022 in setting of hospitalization for resp illness/RSV. CHA2DS2VASc = 4-->eliquis; b. 08/2022 s/p DCCV (100J); c. 09/26/2022 Recurrent Aflutter.   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 02/2022 Carotid U/S: 1-39% bilat ICA stenoses.   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   Contracture of palmar fascia 12/07/2020   COPD (chronic obstructive pulmonary disease) (HCC)    Corn of toe 12/07/2020   Corns and  callosities 04/09/2018   Coronary artery disease    2019 with stents   Coronary atherosclerosis 05/07/2018   Degenerative disc disease, 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   Degenerative disc disease, 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   Diarrhea 04/09/2018   Double vision with both eyes open 12/07/2020   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   Dysphagia 08/14/2018   Dyspnea    ED (erectile dysfunction)    ED (erectile dysfunction) of organic origin 12/07/2020   Elevated prostate specific antigen (PSA) 04/08/2018   Elevated PSA    Encounter for screening for other disorder 12/07/2020   Enlarged prostate without lower urinary tract symptoms (luts) 04/09/2018   Enthesopathy 12/07/2020   Essential (primary) hypertension 04/08/2018   Ex-smoker 04/09/2018   Exertional dyspnea 04/02/2018   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 07/2022 Echo: EF 65-70%, no rwma,  mild LVH, nl RV fxn.   Hyperlipidemia    Hypertension    Hypoxemia 12/07/2020   Kidney cysts    Left knee pain 12/07/2020   Low back pain 04/08/2018   Male erectile dysfunction 04/09/2018   Mixed hyperlipidemia 04/08/2018   Nocturia more than twice per night 04/09/2018   Osteoarthritis of first carpometacarpal joint 04/08/2018   Osteoarthritis of knee 12/07/2020   Other long term (current) drug therapy 12/07/2020   Pain due to onychomycosis of toenail of left foot 07/20/2019   Pain in joint 04/09/2018   Pain in knee 04/09/2018   Pain in unspecified knee 12/07/2020   Pain of finger 04/09/2018   Palpitations    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HCC) 12/07/2020   Personal history of colonic polyps 12/07/2020   Pneumonia    Pneumothorax 04/12/2020   Polypharmacy 04/09/2018   Porokeratosis 01/13/2019   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HCC)    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   Pulmonary nodules 06/08/2016   Pure hypercholesterolemia 12/07/2020   Renal cyst 12/07/2020   Sleep disorder 04/08/2018   Status post bronchoscopy 04/12/2020   Tear of rotator cuff 04/09/2018   Tobacco user 12/07/2020   Uncomplicated alcohol dependence (HCC) 04/09/2018   Unspecified rotator cuff tear or rupture of unspecified shoulder, not specified as traumatic 12/07/2020   Unspecified tear of unspecified meniscus, current injury, unspecified knee, initial encounter 12/07/2020   Visual disturbance 12/07/2020   Vitamin D deficiency 04/08/2018    Past Surgical History:  Procedure Laterality Date   BRONCHIAL BIOPSY  10/13/2019   Procedure: BRONCHIAL BIOPSIES;  Surgeon: Leslye Peer, MD;  Location: [facility] ENDOSCOPY;  Service: Pulmonary;;   BRONCHIAL BIOPSY  04/12/2020   Procedure: BRONCHIAL BIOPSIES;  Surgeon: Leslye Peer, MD;  Location: [facility] ENDOSCOPY;  Service: Pulmonary;;   BRONCHIAL BRUSHINGS  10/13/2019   Procedure: BRONCHIAL BRUSHINGS;  Surgeon: Leslye Peer, MD;  Location: [facility] ENDOSCOPY;  Service: Pulmonary;;   BRONCHIAL BRUSHINGS  04/12/2020   Procedure: BRONCHIAL BRUSHINGS;  Surgeon: Leslye Peer, MD;  Location[facility] ENDOSCOPY;  Service: Pulmonary;;   BRONCHIAL NEEDLE ASPIRATION BIOPSY  10/13/2019   Procedure: BRONCHIAL NEEDLE ASPIRATION BIOPSIES;  Surgeon: Leslye Peer, MD;  Location: [facility] ENDOSCOPY;  Service: Pulmonary;;   BRONCHIAL NEEDLE ASPIRATION BIOPSY  04/12/2020   Procedure: BRONCHIAL NEEDLE ASPIRATION BIOPSIES;  Surgeon: Leslye Peer, MD;  Location: [facility] ENDOSCOPY;  Service: Pulmonary;;   BRONCHIAL WASHINGS  10/13/2019   Procedure: BRONCHIAL WASHINGS;  Surgeon: Leslye Peer, MD;  Location: [facility] ENDOSCOPY;  Service: Pulmonary;;   BRONCHIAL WASHINGS  04/12/2020   Procedure: BRONCHIAL WASHINGS;  Surgeon: Leslye Peer, MD;  Location: [facility] ENDOSCOPY;  Service: Pulmonary;;   CARDIAC CATHETERIZATION  2002   50% RCA   CARDIOVERSION N/A 09/17/2022    Procedure: CARDIOVERSION;  Surgeon: Iran Ouch, MD;  Location: [facility] ORS;  Service: Cardiovascular;  Laterality: N/A;   CATARACT EXTRACTION, BILATERAL     CORONARY STENT INTERVENTION N/A 04/15/2018   Procedure: CORONARY STENT INTERVENTION;  Surgeon: Corky Crafts, MD;  Location: [facility] INVASIVE CV LAB;  Service: Cardiovascular;  Laterality: N/A;  om1   EYE SURGERY     KNEE ARTHROSCOPY     LEFT HEART CATH AND CORONARY ANGIOGRAPHY N/A 04/15/2018   Procedure: LEFT HEART CATH AND CORONARY ANGIOGRAPHY;  Surgeon: Corky Crafts, MD;  Location: [facility] INVASIVE CV LAB;  Service: Cardiovascular;  Laterality: N/A;   MULTIPLE TOOTH EXTRACTIONS     TONSILLECTOMY     VIDEO BRONCHOSCOPY  04/12/2020   VIDEO BRONCHOSCOPY WITH ENDOBRONCHIAL NAVIGATION N/A 07/26/2016   Procedure: VIDEO BRONCHOSCOPY WITH ENDOBRONCHIAL NAVIGATION;  Surgeon: Leslye Peer, MD;  Location: [facility] OR;  Service: Thoracic;  Laterality: N/A;   VIDEO BRONCHOSCOPY WITH ENDOBRONCHIAL NAVIGATION N/A 10/13/2019   Procedure: [facility] AND VIDEO BRONCHOSCOPY WITH ENDOBRONCHIAL NAVIGATION;  Surgeon: Leslye Peer, MD;  Location: [facility] ENDOSCOPY;  Service: Pulmonary;  Laterality: N/A;   VIDEO BRONCHOSCOPY WITH ENDOBRONCHIAL NAVIGATION N/A 04/12/2020   Procedure: VIDEO BRONCHOSCOPY WITH ENDOBRONCHIAL NAVIGATION;  Surgeon: Leslye Peer, MD;  Location: [facility] ENDOSCOPY;  Service: Pulmonary;  Laterality: N/A;    Family History  Problem Relation Age of Onset   Hypertension Mother    Alzheimer's disease Mother     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  Tobacco Use   Smoking status: Former    Current packs/day: 0.00    Average packs/day: 0.8 packs/day  for 68.0 years (51.0 ttl pk-yrs)    Types: Cigarettes    Start date: 7    Quit date: 07/13/2020    Years since quitting: 3.0    Passive exposure: Never   Smokeless tobacco: Never   Tobacco comments:    Recent Quit    Vaping Use   Vaping status: Former  Substance and Sexual Activity   Alcohol use: No    Alcohol/week: 0.0 standard drinks of alcohol    Comment: quit drinking 2014   Drug use: No   Sexual activity: Not on file  Other Topics Concern   Not on file  Social History Narrative   Has living will   Wife is health care POA--alternate is son Jeanice Lim)   Has daughter in [place]   Has DNR   No tube feeds if cognitively unaware   Social Drivers of Health   Financial Resource Strain: Low Risk  (01/06/2023)   Overall Financial Resource Strain (CARDIA)    Difficulty of Paying Living Expenses: Not hard at all  Food Insecurity: No Food Insecurity (06/01/2023)   Hunger Vital Sign    Worried About Running Out of Food in the Last Year: Never true    Ran Out of Food in the Last Year: Never true  Transportation Needs: No Transportation Needs (06/01/2023)   PRAPARE - Administrator, Civil Service (Medical): No    Lack of Transportation (Non-Medical): No  Physical Activity: Sufficiently Active (01/06/2023)   Exercise Vital Sign    Days of Exercise per Week: 4 days    Minutes of Exercise per Session: 40 min  Stress: No Stress Concern Present (01/06/2023)   [employer] of Occupational Health - Occupational Stress Questionnaire    Feeling of Stress : Not at all  Social Connections: Unknown (01/06/2023)   Social Connection and Isolation Panel [NHANES]    Frequency of Communication with Friends and Family: Never    Frequency of Social Gatherings with Friends and Family: Once a week    Attends Religious Services: More than 4 times per year    Active Member of [employer] or Organizations: Not on file    Attends Banker Meetings: Not on file    Marital Status:  Married  Catering manager  Violence: Not At Risk (06/01/2023)   Humiliation, Afraid, Rape, and Kick questionnaire    Fear of Current or Ex-Partner: No    Emotionally Abused: No    Physically Abused: No    Sexually Abused: No   Review of Systems Appetite is not good--but he forces himself to eat Taste for food only just improving Has lost 4# in past few months Sleeps okay Wears seat belt Teeth are okay---[facility] trouble with plantar fasciitis--improved with therapy, Discussed better arch support Now seeing [facility] dermatologist Rare heartburn---takes pantoprazole daily. No dysphagia Bowels move okay No sig back or joint pains Voids well---flow is good    Objective:   Physical Exam Constitutional:      Appearance: Normal appearance.  HENT:     Mouth/Throat:     Pharynx: No oropharyngeal exudate or posterior oropharyngeal erythema.  Eyes:     Conjunctiva/sclera: Conjunctivae normal.     Pupils: Pupils are equal, round, and reactive to light.  Cardiovascular:     Rate and Rhythm: Normal rate and regular rhythm.     Pulses: Normal pulses.     Heart sounds: No murmur heard.    No gallop.  Pulmonary:     Effort: Pulmonary effort is normal.     Breath sounds: No wheezing or rales.     Comments: Markedly decreased breath sounds Audible wheezing and prolonged expiration Abdominal:     Palpations: Abdomen is soft.     Tenderness: There is no abdominal tenderness.  Musculoskeletal:     Cervical back: Neck supple.     Comments: 1+ edema in feet  Lymphadenopathy:     Cervical: No cervical adenopathy.  Skin:    Findings: No lesion or rash.     Comments: Small ulcer with black granulation at tip of left great toe  Neurological:     General: No focal deficit present.     Mental Status: He is alert and oriented to person, place, and time.  Psychiatric:        Mood and Affect: Mood normal.        Behavior: Behavior normal.            Assessment &  Plan:

## 2023-07-15 NOTE — Assessment & Plan Note (Signed)
Variable Will recheck

## 2023-07-15 NOTE — Assessment & Plan Note (Signed)
I have personally reviewed the Medicare Annual Wellness questionnaire and have noted 1. The patient's medical and social history 2. Their use of alcohol, tobacco or illicit drugs 3. Their current medications and supplements 4. The patient's functional ability including ADL's, fall risks, home safety risks and hearing or visual             impairment. 5. Diet and physical activities 6. Evidence for depression or mood disorders  The patients weight, height, BMI and visual acuity have been recorded in the chart I have made referrals, counseling and provided education to the patient based review of the above and I have provided the pt with a written personalized care plan for preventive services.  I have provided you with a copy of your personalized plan for preventive services. Please take the time to review along with your updated medication list.  Done with cancer screening Has had RSV Will update COVID Does exercise regularly

## 2023-07-15 NOTE — Progress Notes (Signed)
Hearing Screening - Comments:: January 2024. Did not get hearing aids.  Vision Screening - Comments:: September 2024

## 2023-07-15 NOTE — Assessment & Plan Note (Signed)
Decision for no Rx Will recheck PSA_--consider ADT if goes up (last 13.7) On finasteride

## 2023-07-15 NOTE — Assessment & Plan Note (Signed)
Likely from prednisone Will recheck

## 2023-07-17 DIAGNOSIS — R278 Other lack of coordination: Secondary | ICD-10-CM | POA: Diagnosis not present

## 2023-07-17 DIAGNOSIS — I4811 Longstanding persistent atrial fibrillation: Secondary | ICD-10-CM | POA: Diagnosis not present

## 2023-07-17 DIAGNOSIS — R2689 Other abnormalities of gait and mobility: Secondary | ICD-10-CM | POA: Diagnosis not present

## 2023-07-17 DIAGNOSIS — J441 Chronic obstructive pulmonary disease with (acute) exacerbation: Secondary | ICD-10-CM | POA: Diagnosis not present

## 2023-07-17 DIAGNOSIS — J431 Panlobular emphysema: Secondary | ICD-10-CM | POA: Diagnosis not present

## 2023-07-17 DIAGNOSIS — Z741 Need for assistance with personal care: Secondary | ICD-10-CM | POA: Diagnosis not present

## 2023-07-17 DIAGNOSIS — Z9981 Dependence on supplemental oxygen: Secondary | ICD-10-CM | POA: Diagnosis not present

## 2023-07-17 DIAGNOSIS — I4891 Unspecified atrial fibrillation: Secondary | ICD-10-CM | POA: Diagnosis not present

## 2023-07-17 DIAGNOSIS — J9621 Acute and chronic respiratory failure with hypoxia: Secondary | ICD-10-CM | POA: Diagnosis not present

## 2023-07-17 DIAGNOSIS — I2510 Atherosclerotic heart disease of native coronary artery without angina pectoris: Secondary | ICD-10-CM | POA: Diagnosis not present

## 2023-07-17 DIAGNOSIS — M6281 Muscle weakness (generalized): Secondary | ICD-10-CM | POA: Diagnosis not present

## 2023-07-19 DIAGNOSIS — I4891 Unspecified atrial fibrillation: Secondary | ICD-10-CM | POA: Diagnosis not present

## 2023-07-19 DIAGNOSIS — I4811 Longstanding persistent atrial fibrillation: Secondary | ICD-10-CM | POA: Diagnosis not present

## 2023-07-19 DIAGNOSIS — R278 Other lack of coordination: Secondary | ICD-10-CM | POA: Diagnosis not present

## 2023-07-19 DIAGNOSIS — J9621 Acute and chronic respiratory failure with hypoxia: Secondary | ICD-10-CM | POA: Diagnosis not present

## 2023-07-19 DIAGNOSIS — M6281 Muscle weakness (generalized): Secondary | ICD-10-CM | POA: Diagnosis not present

## 2023-07-19 DIAGNOSIS — I2510 Atherosclerotic heart disease of native coronary artery without angina pectoris: Secondary | ICD-10-CM | POA: Diagnosis not present

## 2023-07-19 DIAGNOSIS — Z9981 Dependence on supplemental oxygen: Secondary | ICD-10-CM | POA: Diagnosis not present

## 2023-07-19 DIAGNOSIS — R2689 Other abnormalities of gait and mobility: Secondary | ICD-10-CM | POA: Diagnosis not present

## 2023-07-19 DIAGNOSIS — Z741 Need for assistance with personal care: Secondary | ICD-10-CM | POA: Diagnosis not present

## 2023-07-19 DIAGNOSIS — J441 Chronic obstructive pulmonary disease with (acute) exacerbation: Secondary | ICD-10-CM | POA: Diagnosis not present

## 2023-07-19 DIAGNOSIS — J431 Panlobular emphysema: Secondary | ICD-10-CM | POA: Diagnosis not present

## 2023-07-20 ENCOUNTER — Encounter: Payer: Self-pay | Admitting: Internal Medicine

## 2023-07-23 ENCOUNTER — Encounter: Payer: Medicare Other | Attending: Physician Assistant | Admitting: Physician Assistant

## 2023-07-23 DIAGNOSIS — L89313 Pressure ulcer of right buttock, stage 3: Secondary | ICD-10-CM | POA: Diagnosis not present

## 2023-07-23 DIAGNOSIS — S81811A Laceration without foreign body, right lower leg, initial encounter: Secondary | ICD-10-CM | POA: Diagnosis not present

## 2023-07-23 DIAGNOSIS — Z87891 Personal history of nicotine dependence: Secondary | ICD-10-CM | POA: Insufficient documentation

## 2023-07-23 DIAGNOSIS — I10 Essential (primary) hypertension: Secondary | ICD-10-CM | POA: Insufficient documentation

## 2023-07-23 DIAGNOSIS — L89613 Pressure ulcer of right heel, stage 3: Secondary | ICD-10-CM | POA: Diagnosis present

## 2023-07-23 DIAGNOSIS — X58XXXA Exposure to other specified factors, initial encounter: Secondary | ICD-10-CM | POA: Diagnosis not present

## 2023-07-23 DIAGNOSIS — J4489 Other specified chronic obstructive pulmonary disease: Secondary | ICD-10-CM | POA: Diagnosis not present

## 2023-07-23 NOTE — Progress Notes (Addendum)
MORSE, FRADETTE [name] ([id number]) [id number] Page 1 of 8 Visit Report for 07/23/2023 Chief Complaint Document Details Patient Name: Date of Service: [name] Muse RO LD [name]. 07/23/2023 9:30 [name] Medical Record Number: [id number] Patient Account Number: [id number] Date of Birth/Sex: Treating RN: 1941/02/18 (82 y.o. [name] Petit) Yevonne Pax Primary Care Provider: Tillman Abide Other Clinician: Referring Provider: Treating Provider/Extender: Allen Derry Self, Referral Weeks in Treatment: 0 Information Obtained from: Patient Chief Complaint Right buttock pressure ulcer and right lower leg skin tear Electronic Signature(s) Signed: 07/23/2023 12:32:38 PM By: Allen Derry PA-C Entered By: Allen Derry on 07/23/2023 09:32:38 -------------------------------------------------------------------------------- HPI Details Patient Name: Date of Service: [name] Muse RO LD [name]. 07/23/2023 9:30 [name] Medical Record Number: [id number] Patient Account Number: [id number] Date of Birth/Sex: Treating RN: 01/18/1941 (82 y.o. [name] [name] Primary Care Provider: Tillman Abide Other Clinician: Referring Provider: Treating Provider/Extender: Allen Derry Self, Referral Weeks in Treatment: 0 History of Present Illness HPI Description: 07-23-23 on evaluation today. Patient appears to be doing well currently regard to his wounds. He actually has one in the gluteal region and one on his on his right anterior lower leg. Both actually appear to be doing quite well, and in fact seem to be very close to completely resolved, especially the leg. Obviously, they were both much worse at one point but have dramatically improved since inception. He doesn't really have any pain at the lead but does have some discomfort in the gluteal/sacral region. Patient has [name] history of COPD and hypertension, but does not appear to have any other contributing major medical factors. Electronic Signature(s) Signed: 07/23/2023  3:46:23 PM By: Allen Derry PA-C Entered By: Allen Derry on 07/23/2023 12:42:52 [name] [name] ([id number]) [id number] Page 2 of 8 -------------------------------------------------------------------------------- Physical Exam Details Patient Name: Date of Service: [name] Muse RO LD [name]. 07/23/2023 9:30 [name] Medical Record Number: [id number] Patient Account Number: [id number] Date of Birth/Sex: Treating RN: Jan 21, 1941 (82 y.o. [name] Petit) Yevonne Pax Primary Care Provider: Tillman Abide Other Clinician: Referring Provider: Treating Provider/Extender: Allen Derry Self, Referral Weeks in Treatment: 0 Constitutional sitting or standing blood pressure is within target range for patient.. pulse regular and within target range for patient.Marland Kitchen respirations regular, non-labored and within target range for patient.Marland Kitchen temperature within target range for patient.. Well-nourished and well-hydrated in no acute distress. Eyes conjunctiva clear no eyelid edema noted. pupils equal round and reactive to light and accommodation. Ears, Nose, Mouth, and Throat no gross abnormality of ear auricles or external auditory canals. normal hearing noted during conversation. mucus membranes moist. Respiratory normal breathing without difficulty. Cardiovascular 2+ dorsalis pedis/posterior tibialis pulses. no clubbing, cyanosis, significant edema, <3 sec cap refill. Musculoskeletal normal gait and posture. no significant deformity or arthritic changes, no loss or range of motion, no clubbing. Psychiatric this patient is able to make decisions and demonstrates good insight into disease process. Alert and Oriented x 3. pleasant and cooperative. Notes Patient's wounds both appear to be doing excellent. There's no need for short treatment at either site. Both are almost completely healed. Electronic Signature(s) Signed: 07/23/2023 3:46:23 PM By: Allen Derry PA-C Entered By: Allen Derry on 07/23/2023  12:43:26 -------------------------------------------------------------------------------- Physician Orders Details Patient Name: Date of Service: [name] Muse RO LD [name]. 07/23/2023 9:30 [name] Medical Record Number: [id number] Patient Account Number: [id number] Date of Birth/Sex: Treating RN: June 02, 1941 (82 y.o. [name] [name] Primary Care Provider: Tillman Abide Other Clinician: Referring Provider: Treating Provider/Extender: Allen Derry Self, Referral Weeks in Treatment: 0 The following  information was scribed by: Yevonne Pax The information was scribed for: Allen Derry Verbal / Phone Orders: No [name] [name], [name] [name] ([id number]) [id number] Page 3 of 8 Diagnosis Coding Follow-up Appointments Return Appointment in 2 weeks. Home Health Other Home Health Orders/Instructions: - Fax orders to Darby at [facility] [phone] [facility] wounds with antibacterial soap and water. Edema Control - Orders / Instructions Elevate, Exercise Daily and [name] void Standing for Long Periods of Time. Elevate legs to the level of the heart and pump ankles as often as possible Elevate leg(s) parallel to the floor when sitting. Wound Treatment Wound #1 - Gluteus Wound Laterality: Right Cleanser: Soap and Water 3 x Per Day/30 Days Discharge Instructions: Gently cleanse wound with antibacterial soap, rinse and pat dry prior to dressing wounds Topical: calmoseptine 3 x Per Day/30 Days Wound #2 - Lower Leg Wound Laterality: Right, Anterior, Proximal Cleanser: Soap and Water 3 x Per Week/30 Days Discharge Instructions: Gently cleanse wound with antibacterial soap, rinse and pat dry prior to dressing wounds Secondary Dressing: (BORDER) Zetuvit Plus SILICONE BORDER Dressing 4x4 (in/in) 3 x Per Week/30 Days Discharge Instructions: Please do not put silicone bordered dressings under wraps. Use non-bordered dressing only. Electronic Signature(s) Signed: 07/23/2023 3:46:23  PM By: Allen Derry PA-C Signed: 07/29/2023 3:37:08 PM By: Yevonne Pax RN Previous Signature: 07/23/2023 10:40:45 AM Version By: Yevonne Pax RN Entered By: Yevonne Pax on 07/23/2023 08:06:30 -------------------------------------------------------------------------------- Problem List Details Patient Name: Date of Service: [name] [name], [name] [name] RO LD [name]. 07/23/2023 9:30 [name] Medical Record Number: [id number] Patient Account Number: [id number] Date of Birth/Sex: Treating RN: 1940/12/22 (82 y.o. 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, stage 3 07/23/2023 No Yes S81.811A Laceration without foreign body, right lower leg, initial encounter 07/23/2023 No Yes I10 Essential (primary) hypertension 07/23/2023 No Yes [name] [name], [name] [name] ([id number]) [phone].pdf Page 4 of 8 J44.89 Other specified chronic obstructive pulmonary disease 07/23/2023 No Yes Inactive Problems Resolved Problems Electronic Signature(s) Signed: 07/23/2023 12:32:03 PM By: Allen Derry PA-C Previous Signature: 07/23/2023 10:53:11 AM Version By: Allen Derry PA-C Previous Signature: 07/23/2023 9:47:43 AM Version By: Allen Derry PA-C Entered By: Allen Derry on 07/23/2023 09:32:03 -------------------------------------------------------------------------------- Progress Note Details Patient Name: Date of Service: [name] Muse RO LD [name]. 07/23/2023 9:30 [name] Medical Record Number: [id number] Patient Account Number: [id number] Date of Birth/Sex: Treating RN: 02/02/1941 (82 y.o. [name] [name] Primary Care Provider: Tillman Abide Other Clinician: Referring Provider: Treating Provider/Extender: Allen Derry Self, Referral Weeks in Treatment: 0 Subjective Chief  Complaint Information obtained from Patient Right buttock pressure ulcer and right lower leg skin tear History of Present Illness (HPI) 07-23-23 on evaluation today. Patient appears to be doing well currently regard to his wounds. He actually has one in the gluteal region and one on his on his right anterior lower leg. Both actually appear to be doing quite well, and in fact seem to be very close to completely resolved, especially the leg. Obviously, they were both much worse at one point but have dramatically improved since inception. He doesn't really have any pain at the lead but does have some discomfort in the gluteal/sacral region. Patient has [name] history of COPD and hypertension, but does not appear to have any other contributing major medical factors. Patient History Allergies Flagyl Social History Former smoker, Marital Status -  Married, Alcohol Use - Never, Drug Use - No History, Caffeine Use - Daily. Medical History Respiratory Patient has history of Chronic Obstructive Pulmonary Disease (COPD) Cardiovascular Patient has history of Arrhythmia, Coronary Artery Disease, Hypertension Review of Systems (ROS) Eyes Complains or has symptoms of Vision Changes. Genitourinary CKD 3 Oncologic prostate ca current AAIDAN, RENDER [name] ([id number]) [id number] Page 5 of 8 Objective Constitutional sitting or standing blood pressure is within target range for patient.. pulse regular and within target range for patient.Marland Kitchen respirations regular, non-labored and within target range for patient.Marland Kitchen temperature within target range for patient.. Well-nourished and well-hydrated in no acute distress. Vitals Time Taken: 10:03 AM, Height: 70 in, Source: Stated, Weight: 152 lbs, Source: Stated, BMI: 21.8, Temperature: 98.8 F, Pulse: 82 bpm, Respiratory Rate: 20 breaths/min, Blood Pressure: 111/62 mmHg. Eyes conjunctiva clear no eyelid edema noted. pupils equal round and reactive  to light and accommodation. Ears, Nose, Mouth, and Throat no gross abnormality of ear auricles or external auditory canals. normal hearing noted during conversation. mucus membranes moist. Respiratory normal breathing without difficulty. Cardiovascular 2+ dorsalis pedis/posterior tibialis pulses. no clubbing, cyanosis, significant edema, Musculoskeletal normal gait and posture. no significant deformity or arthritic changes, no loss or range of motion, no clubbing. Psychiatric this patient is able to make decisions and demonstrates good insight into disease process. Alert and Oriented x 3. pleasant and cooperative. General Notes: Patient's wounds both appear to be doing excellent. There's no need for short treatment at either site. Both are almost completely healed. Integumentary (Hair, Skin) Wound #1 status is Open. Original cause of wound was Gradually Appeared. The date acquired was: 04/30/2023. The wound is located on the Right Gluteus. The wound measures 1cm length x 0.5cm width x 0.1cm depth; 0.393cm^2 area and 0.039cm^3 volume. There is Fat Layer (Subcutaneous Tissue) exposed. There is no tunneling or undermining noted. There is [name] medium amount of serosanguineous drainage noted. There is large (67-100%) red granulation within the wound bed. There is [name] small (1-33%) amount of necrotic tissue within the wound bed including Adherent Slough. Wound #2 status is Open. Original cause of wound was Gradually Appeared. The date acquired was: 07/01/2023. The wound is located on the Right,Proximal,Anterior Lower Leg. The wound measures 1cm length x 1cm width x 0.1cm depth; 0.785cm^2 area and 0.079cm^3 volume. There is Fat Layer (Subcutaneous Tissue) exposed. There is no tunneling or undermining noted. There is [name] medium amount of serosanguineous drainage noted. There is large (67- 100%) red granulation within the wound bed. Assessment Active Problems ICD-10 Pressure ulcer of right buttock, stage  3 Laceration without foreign body, right lower leg, initial encounter Essential (primary) hypertension Other specified chronic obstructive pulmonary disease Plan Follow-up Appointments: Return Appointment in 2 weeks. Home Health: Other Home Health Orders/Instructions: - Fax orders to Isabel at [facility] [phone] Bathing/ Shower/ Hygiene: Wash wounds with antibacterial soap and water. Edema Control - Orders / Instructions: Elevate, Exercise Daily and Avoid Standing for Long Periods of Time. Elevate legs to the level of the heart and pump ankles as often as possible Elevate leg(s) parallel to the floor when sitting. WOUND #1: - Gluteus Wound Laterality: Right Cleanser: Soap and Water 3 x Per Day/30 Days Discharge Instructions: Gently cleanse wound with antibacterial soap, rinse and pat dry prior to dressing wounds Topical: calmoseptine 3 x Per Day/30 Days WOUND #2: - Lower Leg Wound Laterality: Right, Anterior, Proximal Cleanser: Soap and Water 3 x Per Week/30 Days Discharge Instructions: Gently cleanse wound with antibacterial soap, rinse and  pat dry prior to dressing wounds Secondary Dressing: (BORDER) Zetuvit Plus SILICONE BORDER Dressing 4x4 (in/in) 3 x Per Week/30 Days Discharge Instructions: Please do not put silicone bordered dressings under wraps. Use non-bordered dressing only. [name] [name], [name] [name] ([id number]) [id number] Page 6 of 8 1. I am going to recommend currently that we have the patient continue to use Xeroform gauze dressings to the leg, which I think should do quite well. He is getting these changed at [facility]. 2. I'm going to recommend that we utilize, calmaseptine to the glutea area which I do not feel is gonna require any actual dressing based on what I'm seeing right now. It is very close to being completely resolved. I would recommend this be applied three times per day. I will see the patient back for [name] visit in two weeks to see where  things stand. Electronic Signature(s) Signed: 07/23/2023 3:46:23 PM By: Allen Derry PA-C Entered By: Allen Derry on 07/23/2023 12:45:38 -------------------------------------------------------------------------------- ROS/PFSH Details Patient Name: Date of Service: [name] Muse RO LD [name]. 07/23/2023 9:30 [name] Medical Record Number: [id number] Patient Account Number: [id number] Date of Birth/Sex: Treating RN: 07-16-1941 (82 y.o. [name] Petit) Yevonne Pax Primary Care Provider: Tillman Abide Other Clinician: Referring Provider: Treating Provider/Extender: Allen Derry Self, Referral Weeks in Treatment: 0 Eyes Complaints and Symptoms: Positive for: Vision Changes Respiratory Medical History: Positive for: Chronic Obstructive Pulmonary Disease (COPD) Cardiovascular Medical History: Positive for: Arrhythmia; Coronary Artery Disease; Hypertension Genitourinary Complaints and Symptoms: Review of System Notes: CKD 3 Oncologic Complaints and Symptoms: Review of System Notes: prostate ca current Immunizations Pneumococcal Vaccine: Received Pneumococcal Vaccination: No Implantable Devices None Family and Social History Former smoker; Marital Status - Married; Alcohol Use: Never; Drug Use: No History; Caffeine Use: Daily Social Determinants of Health (SDOH) [name] [name], [name] [name] ([id number]) [id number] Page 7 of 8 1. In the past 2 months, did you or others you live with eat smaller meals or skip meals because you didn't have money for foodo : No 2. Are you homeless or worried that you might be in the futureo : No 3. Do you have trouble paying for your utilities (gas, electricity, phone)o : No 4. Do you have trouble finding or paying for [name] rideo : No 5. Do you need daycare, or better daycare, for your kidso : No 6. Are you unemployed or without regular incomeo : No 7. Do you need help finding [name] better jobo : No 8. Do you need help getting more educationo : No 9. Are you  concerned about someone in your home using drugs or alcoholo : No 10. Do you feel unsafe in your daily lifeo : No 11. Is anyone in your home threatening or abusing youo : No 12. Do you lack quality relationships that make you feel valued and supportedo : No 13. Do you need help getting cultural information in [name] language you understando : No 14. Do you need help getting internet accesso : No Advanced Directives and Instructions Spiritual or Cultural beliefs preclude asking about Advance Care Planning: No Advanced Directives: Yes Copy Provided: No Do not resuscitate: No Living Will: No Medical Power of Attorney: No Surrogate Decision Maker: No Electronic Signature(s) Signed: 07/23/2023 3:46:23 PM By: Allen Derry PA-C Signed: 07/29/2023 3:37:08 PM By: Yevonne Pax RN Entered By: Yevonne Pax on 07/23/2023 07:07:26 -------------------------------------------------------------------------------- SuperBill Details Patient Name: Date of Service: [name] [name], [name] [name] RO LD [name]. 07/23/2023 Medical Record Number: [id number] Patient Account Number: [id number] Date of Birth/Sex: Treating RN: [birth year] (82 y.o.  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, stage 3 S81.811A Laceration without foreign body, right lower leg, initial encounter I10 Essential (primary) hypertension J44.89 Other specified chronic obstructive pulmonary disease Facility Procedures : CPT4 Code: 16109604 Description: 99214 - WOUND CARE VISIT-LEV 4 EST PT Modifier: Quantity: 1 Physician Procedures : CPT4 Code Description Modifier [id number] WC PHYS LEVEL 3 NEW PT ICD-10 Diagnosis Description 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

## 2023-07-26 DIAGNOSIS — M6281 Muscle weakness (generalized): Secondary | ICD-10-CM | POA: Diagnosis not present

## 2023-07-26 DIAGNOSIS — J9621 Acute and chronic respiratory failure with hypoxia: Secondary | ICD-10-CM | POA: Diagnosis not present

## 2023-07-26 DIAGNOSIS — Z741 Need for assistance with personal care: Secondary | ICD-10-CM | POA: Diagnosis not present

## 2023-07-26 DIAGNOSIS — J431 Panlobular emphysema: Secondary | ICD-10-CM | POA: Diagnosis not present

## 2023-07-26 DIAGNOSIS — I4811 Longstanding persistent atrial fibrillation: Secondary | ICD-10-CM | POA: Diagnosis not present

## 2023-07-26 DIAGNOSIS — R2689 Other abnormalities of gait and mobility: Secondary | ICD-10-CM | POA: Diagnosis not present

## 2023-07-26 DIAGNOSIS — Z9981 Dependence on supplemental oxygen: Secondary | ICD-10-CM | POA: Diagnosis not present

## 2023-07-26 DIAGNOSIS — I2510 Atherosclerotic heart disease of native coronary artery without angina pectoris: Secondary | ICD-10-CM | POA: Diagnosis not present

## 2023-07-26 DIAGNOSIS — R278 Other lack of coordination: Secondary | ICD-10-CM | POA: Diagnosis not present

## 2023-07-26 DIAGNOSIS — I4891 Unspecified atrial fibrillation: Secondary | ICD-10-CM | POA: Diagnosis not present

## 2023-07-26 DIAGNOSIS — J441 Chronic obstructive pulmonary disease with (acute) exacerbation: Secondary | ICD-10-CM | POA: Diagnosis not present

## 2023-07-29 ENCOUNTER — Ambulatory Visit: Payer: Self-pay

## 2023-07-29 DIAGNOSIS — Z741 Need for assistance with personal care: Secondary | ICD-10-CM | POA: Diagnosis not present

## 2023-07-29 DIAGNOSIS — I4811 Longstanding persistent atrial fibrillation: Secondary | ICD-10-CM | POA: Diagnosis not present

## 2023-07-29 DIAGNOSIS — J9621 Acute and chronic respiratory failure with hypoxia: Secondary | ICD-10-CM | POA: Diagnosis not present

## 2023-07-29 DIAGNOSIS — I4891 Unspecified atrial fibrillation: Secondary | ICD-10-CM | POA: Diagnosis not present

## 2023-07-29 DIAGNOSIS — M6281 Muscle weakness (generalized): Secondary | ICD-10-CM | POA: Diagnosis not present

## 2023-07-29 DIAGNOSIS — I2510 Atherosclerotic heart disease of native coronary artery without angina pectoris: Secondary | ICD-10-CM | POA: Diagnosis not present

## 2023-07-29 DIAGNOSIS — J441 Chronic obstructive pulmonary disease with (acute) exacerbation: Secondary | ICD-10-CM | POA: Diagnosis not present

## 2023-07-29 DIAGNOSIS — R2689 Other abnormalities of gait and mobility: Secondary | ICD-10-CM | POA: Diagnosis not present

## 2023-07-29 DIAGNOSIS — J431 Panlobular emphysema: Secondary | ICD-10-CM | POA: Diagnosis not present

## 2023-07-29 DIAGNOSIS — R278 Other lack of coordination: Secondary | ICD-10-CM | POA: Diagnosis not present

## 2023-07-29 DIAGNOSIS — Z9981 Dependence on supplemental oxygen: Secondary | ICD-10-CM | POA: Diagnosis not present

## 2023-07-29 NOTE — Patient Instructions (Signed)
Visit Information  Thank you for taking time to visit with me today. Please don't hesitate to contact me if I can be of assistance to you.   Following are the goals we discussed today:   Goals Addressed             This Visit's Progress    Management and education of chronic health conditions       Interventions Today    Flowsheet Row Most Recent Value  Chronic Disease   Chronic disease during today's visit Chronic Obstructive Pulmonary Disease (COPD), Other  [right middle toe pain/ weeping, buttock wound]  General Interventions   General Interventions Discussed/Reviewed General Interventions Reviewed, Doctor Visits  [evaluation of current treatment plan of mentioned health conditions and patients adherene to plan as established by provider. Assessed for COPD symptoms, right middle toe symptoms and assessed for buttock wound  status.]  Doctor Visits Discussed/Reviewed Doctor Visits Reviewed  Annabell Sabal upcoming provider visits. Advised to keep follow up appointments with providers.]  Exercise Interventions   Exercise Discussed/Reviewed Physical Activity  [assessed patients current physical activity and ongoing PT treatment.]  Education Interventions   Education Provided --  [Advised to call primary care provider regarding right middle toe pain/ redness/ weeping. Advised to continue elevation of legs when sitting/ laying down.]  Pharmacy Interventions   Pharmacy Dicussed/Reviewed Pharmacy Topics Reviewed  [medications reviewed and compliance discussed and advised. Reviewed recommended treatment regimen from 06/26/23 pulmonary office visit. Utilized teach back method to confirm patient understands pulmonary]              Our next appointment is by telephone on 08/28/23 at 2 pm  Please call the care guide team at [phone] if you need to cancel or reschedule your appointment.   If you are experiencing a Mental Health or Behavioral Health Crisis or need someone to talk to, please  call the Suicide and Crisis Lifeline: 988 call 1-800-273-TALK (toll free, 24 hour hotline)  Patient verbalizes understanding of instructions and care plan provided today and agrees to view in MyChart. Active MyChart status and patient understanding of how to access instructions and care plan via MyChart confirmed with patient.     George Ina RN,BSN,CCM [place]  [facility], [facility] coordinator / Case Manager Phone: [phone]

## 2023-07-29 NOTE — Progress Notes (Signed)
KEONA, COOTS A ([id number]) [id number] Page 1 of 10 Visit Report for 07/23/2023 Allergy List Details Patient Name: Date of Service: [name] [name] RO LD A. 07/23/2023 9:30 A M Medical Record Number: [id number] Patient Account Number: [id number] Date of Birth/Sex: Treating RN: [birth year] (82 y.o. [name] [name]) [name] [name] Primary Care Rianna Lukes: Tillman Abide Other Clinician: Referring Valton Schwartz: Treating Helaina Stefano/Extender: Allen Derry Self, Referral Weeks in Treatment: 0 Allergies Active Allergies Flagyl Allergy Notes Electronic Signature(s) Signed: 07/29/2023 3:37:08 PM By: [name] Pax RN Entered By: [name] [name] on 07/23/2023 07:04:21 -------------------------------------------------------------------------------- Arrival Information Details Patient Name: Date of Service: [name] [name] RO LD A. 07/23/2023 9:30 A M Medical Record Number: [id number] Patient Account Number: [id number] Date of Birth/Sex: Treating RN: [birth year] (82 y.o. [name] [name]) [name] [name] Primary Care [name] [name]: Tillman Abide Other Clinician: Referring [name] [name]: Treating [name] [name]/Extender: Allen Derry Self, Referral Weeks in Treatment: 0 Visit Information Patient Arrived: Wheel Chair Arrival Time: 10:02 Accompanied By: wife Transfer Assistance: None Patient Identification Verified: Yes Secondary Verification Process Completed: Yes Patient Requires Transmission-Based Precautions: No Patient Has Alerts: Yes Patient Alerts: Patient on Blood Thinner Electronic Signature(s) Signed: 07/29/2023 3:37:08 PM By: [name] Pax RN Entered By: [name] [name] on 07/23/2023 07:02:53 [name] [name] ([id number]) [id number] Page 2 of 10 -------------------------------------------------------------------------------- Clinic Level of Care Assessment Details Patient Name: Date of Service: [name] [name] RO LD A. 07/23/2023 9:30 A M Medical Record Number: [id number] Patient Account Number:  [id number] Date of Birth/Sex: Treating RN: 1941-07-02 (82 y.o. [name] [name]) [name] [name] Primary Care [name] [name]: Tillman Abide Other Clinician: Referring [name] [name]: Treating [name] [name]/Extender: Allen Derry Self, Referral Weeks in Treatment: 0 Clinic Level of Care Assessment Items TOOL 2 Quantity Score X- 1 0 Use when only an EandM is performed on the INITIAL visit ASSESSMENTS - Nursing Assessment / Reassessment X- 1 20 General Physical Exam (combine w/ comprehensive assessment (listed just below) when performed on new pt. evals) X- 1 25 Comprehensive Assessment (HX, ROS, Risk Assessments, Wounds Hx, etc.) ASSESSMENTS - Wound and Skin A ssessment / Reassessment []  - 0 Simple Wound Assessment / Reassessment - one wound X- 2 5 Complex Wound Assessment / Reassessment - multiple wounds []  - 0 Dermatologic / Skin Assessment (not related to wound area) ASSESSMENTS - Ostomy and/or Continence Assessment and Care []  - 0 Incontinence Assessment and Management []  - 0 Ostomy Care Assessment and Management (repouching, etc.) PROCESS - Coordination of Care X - Simple Patient / Family Education for ongoing care 1 15 []  - 0 Complex (extensive) Patient / Family Education for ongoing care []  - 0 Staff obtains Chiropractor, Records, T Results / Process Orders est []  - 0 Staff telephones HHA, Nursing Homes / Clarify orders / etc []  - 0 Routine Transfer to another Facility (non-emergent condition) []  - 0 Routine Hospital Admission (non-emergent condition) []  - 0 New Admissions / Manufacturing engineer / Ordering NPWT Apligraf, etc. , []  - 0 Emergency Hospital Admission (emergent condition) X- 1 10 Simple Discharge Coordination []  - 0 Complex (extensive) Discharge Coordination PROCESS - Special Needs []  - 0 Pediatric / Minor Patient Management []  - 0 Isolation Patient Management []  - 0 Hearing / Language / Visual special needs []  - 0 Assessment of Community assistance (transportation, D/C  planning, etc.) []  - 0 Additional assistance / Altered mentation []  - 0 Support Surface(s) Assessment (bed, cushion, seat, etc.) INTERVENTIONS - Wound Cleansing / Measurement ZACKERY, CAO A ([id number]) [phone].pdf Page 3 of 10 X- 1 5 Wound Imaging (photographs - any number of wounds) []  - 0 Wound  Tracing (instead of photographs) []  - 0 Simple Wound Measurement - one wound X- 2 5 Complex Wound Measurement - multiple wounds []  - 0 Simple Wound Cleansing - one wound X- 2 5 Complex Wound Cleansing - multiple wounds INTERVENTIONS - Wound Dressings X - Small Wound Dressing one or multiple wounds 2 10 []  - 0 Medium Wound Dressing one or multiple wounds []  - 0 Large Wound Dressing one or multiple wounds []  - 0 Application of Medications - injection INTERVENTIONS - Miscellaneous []  - 0 External ear exam []  - 0 Specimen Collection (cultures, biopsies, blood, body fluids, etc.) []  - 0 Specimen(s) / Culture(s) sent or taken to Lab for analysis []  - 0 Patient Transfer (multiple staff / Nurse, adult / Similar devices) []  - 0 Simple Staple / Suture removal (25 or less) []  - 0 Complex Staple / Suture removal (26 or more) []  - 0 Hypo / Hyperglycemic Management (close monitor of Blood Glucose) []  - 0 Ankle / Brachial Index (ABI) - do not check if billed separately Has the patient been seen at the hospital within the last three years: Yes Total Score: 125 Level Of Care: New/Established - Level 4 Electronic Signature(s) Signed: 07/29/2023 3:37:08 PM By: [name] Pax RN Entered By: [name] [name] on 07/23/2023 08:07:08 -------------------------------------------------------------------------------- Encounter Discharge Information Details Patient Name: Date of Service: [name] [name] RO LD A. 07/23/2023 9:30 A M Medical Record Number: [id number] Patient Account Number: [id number] Date of Birth/Sex: Treating RN: 1940-12-26 (82 y.o. Melonie [place] Primary Care  [name] [name]: Tillman Abide Other Clinician: Referring [name] [name]: Treating [name] [name]/Extender: Allen Derry Self, Referral Weeks in Treatment: 0 Encounter Discharge Information Items Discharge Condition: Stable Ambulatory Status: Wheelchair Discharge Destination: Home Transportation: Private Auto Accompanied By: self Schedule Follow-up Appointment: Yes Clinical Summary of Care: HILDON, SAVOY ([id number]) [id number] Page 4 of 10 Electronic Signature(s) Signed: 07/23/2023 11:10:10 AM By: [name] Pax RN Entered By: [name] [name] on 07/23/2023 08:10:10 -------------------------------------------------------------------------------- Lower Extremity Assessment Details Patient Name: Date of Service: [name] [name] RO LD A. 07/23/2023 9:30 A M Medical Record Number: [id number] Patient Account Number: [id number] Date of Birth/Sex: Treating RN: 07/22/1941 (82 y.o. [name] [name]) [name] [name] Primary Care Elkin Belfield: Tillman Abide Other Clinician: Referring Louellen Haldeman: Treating Garima Chronis/Extender: Allen Derry Self, Referral Weeks in Treatment: 0 Electronic Signature(s) Signed: 07/29/2023 3:37:08 PM By: [name] Pax RN Entered By: [name] [name] on 07/23/2023 07:20:12 -------------------------------------------------------------------------------- Multi Wound Chart Details Patient Name: Date of Service: [name] [name] RO LD A. 07/23/2023 9:30 A M Medical Record Number: [id number] Patient Account Number: [id number] Date of Birth/Sex: Treating RN: July 09, 1941 (82 y.o. [name] [name]) [name] [name] Primary Care Desiderio Dolata: Tillman Abide Other Clinician: Referring Lona Six: Treating Peytyn Trine/Extender: Allen Derry Self, Referral Weeks in Treatment: 0 Vital Signs Height(in): 70 Pulse(bpm): 82 Weight(lbs): 152 Blood Pressure(mmHg): 111/62 Body Mass Index(BMI): 21.8 Temperature(F): 98.8 Respiratory Rate(breaths/min): 20 [1:Photos:] [2:No Photos] [N/A:N/A] Right Gluteus Right, Proximal, Anterior  Lower Leg N/A Wound Location: Gradually Appeared Gradually Appeared N/A Wounding Event: GONZALO, WIMSATT A ([id number]) [phone] Page 5 of 10 Pressure Ulcer Skin T ear N/A Primary Etiology: Chronic Obstructive Pulmonary Chronic Obstructive Pulmonary N/A Comorbid History: Disease (COPD), Arrhythmia, Disease (COPD), Arrhythmia, Coronary Artery Disease, Coronary Artery Disease, Hypertension Hypertension 04/30/2023 07/01/2023 N/A Date Acquired: 0 0 N/A Weeks of Treatment: Open Open N/A Wound Status: No No N/A Wound Recurrence: 1x0.5x0.1 1x1x0.1 N/A Measurements L x W x D (cm) 0.393 0.785 N/A A (cm) : rea 0.039 0.079 N/A Volume (cm) : Category/Stage III Full Thickness Without Exposed N/A Classification: Support Structures  Medium Medium N/A Exudate Amount: Serosanguineous Serosanguineous N/A Exudate Type: red, brown red, brown N/A Exudate Color: Large (67-100%) Large (67-100%) N/A Granulation Amount: Red Red N/A Granulation Quality: Small (1-33%) N/A N/A Necrotic Amount: Fat Layer (Subcutaneous Tissue): Yes Fat Layer (Subcutaneous Tissue): Yes N/A Exposed Structures: Fascia: No Fascia: No Tendon: No Tendon: No Muscle: No Muscle: No Joint: No Joint: No Bone: No Bone: No Large (67-100%) None N/A Epithelialization: Treatment Notes Electronic Signature(s) Signed: 07/29/2023 3:37:08 PM By: [name] Pax RN Entered By: [name] [name] on 07/23/2023 07:20:23 -------------------------------------------------------------------------------- Multi-Disciplinary Care Plan Details Patient Name: Date of Service: [name] [name] RO LD A. 07/23/2023 9:30 A M Medical Record Number: [id number] Patient Account Number: [id number] Date of Birth/Sex: Treating RN: 01-21-1941 (82 y.o. [name] [name]) [name] [name] Primary Care Ruhee Enck: Tillman Abide Other Clinician: Referring Kinze Labo: Treating Robyn Nohr/Extender: Allen Derry Self, Referral Weeks in Treatment: 0 Active  Inactive Wound/Skin Impairment Nursing Diagnoses: Knowledge deficit related to ulceration/compromised skin integrity Goals: Patient/caregiver will verbalize understanding of skin care regimen Date Initiated: 07/23/2023 Target Resolution Date: 08/23/2023 Goal Status: Active Ulcer/skin breakdown will have a volume reduction of 30% by week 4 Date Initiated: 07/23/2023 Target Resolution Date: 08/23/2023 Goal Status: Active Ulcer/skin breakdown will have a volume reduction of 50% by week 8 Date Initiated: 07/23/2023 Target Resolution Date: 09/23/2023 Goal Status: Active Ulcer/skin breakdown will have a volume reduction of 80% by week 12 Date Initiated: 07/23/2023 Target Resolution Date: 10/21/2023 AVANT, SHOAFF ([id number]) [phone].pdf Page 6 of 10 Goal Status: Active Ulcer/skin breakdown will heal within 14 weeks Date Initiated: 07/23/2023 Target Resolution Date: 11/21/2023 Goal Status: Active Interventions: Assess patient/caregiver ability to obtain necessary supplies Assess patient/caregiver ability to perform ulcer/skin care regimen upon admission and as needed Assess ulceration(s) every visit Notes: Electronic Signature(s) Signed: 07/29/2023 3:37:08 PM By: [name] Pax RN Entered By: [name] [name] on 07/23/2023 07:22:21 -------------------------------------------------------------------------------- Pain Assessment Details Patient Name: Date of Service: [name] [name] RO LD A. 07/23/2023 9:30 A M Medical Record Number: [id number] Patient Account Number: [id number] Date of Birth/Sex: Treating RN: 1940/10/04 (82 y.o. [name] [name]) [name] [name] Primary Care Gillermo Poch: Tillman Abide Other Clinician: Referring Driana Dazey: Treating Shaka Cardin/Extender: Allen Derry Self, Referral Weeks in Treatment: 0 Active Problems Location of Pain Severity and Description of Pain Patient Has Paino No Site Locations Pain Management and Medication Current Pain Management: Electronic  Signature(s) Signed: 07/29/2023 3:37:08 PM By: [name] Pax RN Entered By: [name] [name] on 07/23/2023 07:03:02 [name] [name] ([id number]) [id number] Page 7 of 10 -------------------------------------------------------------------------------- Patient/Caregiver Education Details Patient Name: Date of Service: [name] [name] RO LD A. 12/24/2024andnbsp9:30 A M Medical Record Number: [id number] Patient Account Number: [id number] Date of Birth/Gender: Treating RN: April 06, 1941 (82 y.o. [name] [name]) [name] [name] Primary Care Physician: Tillman Abide Other Clinician: Referring Physician: Treating Physician/Extender: Allen Derry Self, Referral Weeks in Treatment: 0 Education Assessment Education Provided To: Patient Education Topics Provided Wound/Skin Impairment: Handouts: Caring for Your Ulcer Methods: Explain/Verbal Responses: State content correctly Electronic Signature(s) Signed: 07/29/2023 3:37:08 PM By: [name] Pax RN Entered By: [name] [name] on 07/23/2023 07:22:34 -------------------------------------------------------------------------------- Wound Assessment Details Patient Name: Date of Service: [name] [name] RO LD A. 07/23/2023 9:30 A M Medical Record Number: [id number] Patient Account Number: [id number] Date of Birth/Sex: Treating RN: 1940/09/16 (82 y.o. 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 Wound Open Wounding Event: Gradually Appeared Status: Date Acquired: 04/30/2023  Comorbid Chronic Obstructive Pulmonary Disease (COPD), Arrhythmia, Weeks Of Treatment: 0 History: Coronary Artery Disease, Hypertension Clustered Wound: No Photos KIELAN, FISK A ([id number]) [id number] Page 8 of 10 Wound  Measurements Length: (cm) 1 Width: (cm) 0.5 Depth: (cm) 0.1 Area: (cm) 0.393 Volume: (cm) 0.039 % Reduction in Area: % Reduction in Volume: Epithelialization: Large (67-100%) Tunneling: No Undermining: No Wound Description Classification: Category/Stage III Exudate Amount: Medium Exudate Type: Serosanguineous Exudate Color: red, brown Foul Odor After Cleansing: No Slough/Fibrino Yes Wound Bed Granulation Amount: Large (67-100%) Exposed Structure Granulation Quality: Red Fascia Exposed: No Necrotic Amount: Small (1-33%) Fat Layer (Subcutaneous Tissue) Exposed: Yes Necrotic Quality: Adherent Slough Tendon Exposed: No Muscle Exposed: No Joint Exposed: No Bone Exposed: No Treatment Notes Wound #1 (Gluteus) Wound Laterality: Right Cleanser Soap and Water Discharge Instruction: Gently cleanse wound with antibacterial soap, rinse and pat dry prior to dressing wounds Peri-Wound Care Topical calmoseptine Primary Dressing Secondary Dressing Secured With Compression Wrap Compression Stockings Add-Ons Electronic Signature(s) Signed: 07/29/2023 3:37:08 PM By: [name] Pax RN Entered By: [name] [name] on 07/23/2023 07:15:56 [name] [name] ([id number]) [id number] Page 9 of 10 -------------------------------------------------------------------------------- Wound Assessment Details Patient Name: Date of Service: [name] [name] RO LD A. 07/23/2023 9:30 A M Medical Record Number: [id number] Patient Account Number: [id number] Date of Birth/Sex: Treating RN: [birth year] (82 y.o. [name] [name]) [name] [name] Primary Care Sanjit Mcmichael: Tillman Abide Other Clinician: Referring Aneira Cavitt: Treating Yashvi Jasinski/Extender: Allen Derry Self, Referral Weeks in Treatment: 0 Wound Status Wound Number: 2 Primary Skin Tear Etiology: Wound Location: Right, Proximal, Anterior Lower Leg Wound Open Wounding Event: Gradually Appeared Status: Date Acquired: 07/01/2023 Comorbid Chronic  Obstructive Pulmonary Disease (COPD), Arrhythmia, Weeks Of Treatment: 0 History: Coronary Artery Disease, Hypertension Clustered Wound: No Wound Measurements Length: (cm) 1 Width: (cm) 1 Depth: (cm) 0.1 Area: (cm) 0.785 Volume: (cm) 0.079 % Reduction in Area: % Reduction in Volume: Epithelialization: None Tunneling: No Undermining: No Wound Description Classification: Full Thickness Without Exposed Support Structures Exudate Amount: Medium Exudate Type: Serosanguineous Exudate Color: red, brown Foul Odor After Cleansing: No Slough/Fibrino No Wound Bed Granulation Amount: Large (67-100%) Exposed Structure Granulation Quality: Red Fascia Exposed: No Fat Layer (Subcutaneous Tissue) Exposed: Yes Tendon Exposed: No Muscle Exposed: No Joint Exposed: No Bone Exposed: No Treatment Notes Wound #2 (Lower Leg) Wound Laterality: Right, Anterior, Proximal Cleanser Soap and Water Discharge Instruction: Gently cleanse wound with antibacterial soap, rinse and pat dry prior to dressing wounds Peri-Wound Care Topical Primary Dressing Secondary Dressing (BORDER) Zetuvit Plus SILICONE BORDER Dressing 4x4 (in/in) Discharge Instruction: Please do not put silicone bordered dressings under wraps. Use non-bordered dressing only. Secured With Compression Wrap Compression Stockings Add-Ons TARO, RAWLINSON A ([id number]) [id number] Page 10 of 10 Electronic Signature(s) Signed: 07/29/2023 3:37:08 PM By: [name] Pax RN Entered By: [name] [name] on 07/23/2023 07:18:26 -------------------------------------------------------------------------------- Vitals Details Patient Name: Date of Service: Clinton Quant, Gaylyn Rong RO LD A. 07/23/2023 9:30 A M Medical Record Number: [id number] Patient Account Number: [id number] Date of Birth/Sex: Treating RN: 01/22/1941 (82 y.o. [name] [name]) [name] [name] Primary Care Melany Wiesman: Tillman Abide Other Clinician: Referring Kejuan Bekker: Treating  Kaire Stary/Extender: Allen Derry Self, Referral Weeks in Treatment: 0 Vital Signs Time Taken: 10:03 Temperature (F): 98.8 Height (in): 70 Pulse (bpm): 82 Source: Stated Respiratory Rate (breaths/min): 20 Weight (lbs): 152 Blood Pressure (mmHg): 111/62 Source: Stated Reference Range: 80 - 120 mg / dl Body Mass Index (BMI): 21.8 Electronic Signature(s) Signed: 07/29/2023 3:37:08 PM By: [name] Pax RN Entered By: [name] [name] on 07/23/2023 07:04:01

## 2023-07-29 NOTE — Patient Outreach (Signed)
  Care Coordination   Follow Up Visit Note   07/29/2023 Name: [name] [name]. MRN: [id number] DOB: 1941-03-23  [name] [name]. is a 82 y.o. year old male who sees [name], MD for primary care. I spoke with  [name] [name]. by phone today.  What matters to the patients health and wellness today?  Patient states he continues to have issues with his breathing. He reports having a follow up visit with his pulmonologist on 06/26/23.  He states pulmonologist advised treatment plan to be:  -Start with using the hypertonic saline by nebulizer -Then follow the albuterol by nebulizer -Then use the flutter device: please do at least 10-15 blows into the device for at least 23 seconds each -And finish up with using the incentive spirometer; use for at least 10 times -After doing these, attempt to cough.  Please do these twice a day  Use breztri with a spacer device Patient reports ongoing Physical therapy 2x per week. He states he also lifts weights at least 2 days per week alternate from PT visits.  Patient states he feels he is stronger. Patient states buttock wound is healing well.  Patient reports having severe right middle toe pain and weeping during the night last night.  He states pain has subsided during the day. Patient states he feet and ankles continue to be swollen which is his baseline.      Goals Addressed             This Visit's Progress    Management and education of chronic health conditions       Interventions Today    Flowsheet Row Most Recent Value  Chronic Disease   Chronic disease during today's visit Chronic Obstructive Pulmonary Disease (COPD), Other  [right middle toe pain/ weeping, buttock wound]  General Interventions   General Interventions Discussed/Reviewed General Interventions Reviewed, Doctor Visits  [evaluation of current treatment plan of mentioned health conditions and patients adherene to plan as established by provider. Assessed for  COPD symptoms, right middle toe symptoms and assessed for buttock wound  status.]  Doctor Visits Discussed/Reviewed Doctor Visits Reviewed  Annabell Sabal upcoming provider visits. Advised to keep follow up appointments with providers.]  Exercise Interventions   Exercise Discussed/Reviewed Physical Activity  [assessed patients current physical activity and ongoing PT treatment.]  Education Interventions   Education Provided --  [Advised to call primary care provider regarding right middle toe pain/ redness/ weeping. Advised to continue elevation of legs when sitting/ laying down.]  Pharmacy Interventions   Pharmacy Dicussed/Reviewed Pharmacy Topics Reviewed  [medications reviewed and compliance discussed and advised. Reviewed recommended treatment regimen from 06/26/23 pulmonary office visit. Utilized teach back method to confirm patient understands pulmonary]              SDOH assessments and interventions completed:  No     Care Coordination Interventions:  Yes, provided   Follow up plan: Follow up call scheduled for 08/28/23    Encounter Outcome:  Patient Visit Completed   George Ina RN,BSN,CCM [facility]  [facility] coordinator / Case Manager Phone: [phone]

## 2023-07-29 NOTE — Progress Notes (Signed)
[name] [name], [name] [name] ([id number]) [phone] [id number] Page 1 of 5 Visit Report for 07/23/2023 Abuse Risk [name] Details Patient Name: Date of Service: [name] [name] RO LD [name]. 07/23/2023 9:30 [name] [name] Medical Record Number: [id number] Patient Account Number: [id number] Date of Birth/Sex: Treating RN: [birth year] (82 y.o. [name] [name]) [name] [name] Primary Care [name] [name]: [name] [name] Other Clinician: Referring [name] [name]: Treating [name] [name]/Extender: [name] [name] Self, Referral Weeks in Treatment: 0 Abuse Risk [name] Items Answer ABUSE RISK [name]: Has anyone close to you tried to hurt or harm you recentlyo No Do you feel uncomfortable with anyone in your familyo No Has anyone forced you do things that you didnt want to doo No Electronic Signature(s) Signed: 07/29/2023 3:37:08 PM By: [name] Pax RN Entered By: [name] [name] on 07/23/2023 07:07:32 -------------------------------------------------------------------------------- Activities of Daily Living Details Patient Name: Date of Service: [name] [name] RO LD [name]. 07/23/2023 9:30 [name] [name] Medical Record Number: [id number] Patient Account Number: [id number] Date of Birth/Sex: Treating RN: 11-Apr-1941 (82 y.o. [name] [name]) [name] [name] Primary Care [name] [name]: [name] [name] Other Clinician: Referring [name] [name]: Treating [name] [name]/Extender: [name] [name] Self, Referral Weeks in Treatment: 0 Activities of Daily Living Items Answer Activities of Daily Living (Please select one for each item) Drive Automobile Completely Able T Medications ake Completely Able Use T elephone Completely Able Care for Appearance Completely Able Use T oilet Completely Able Bath / Shower Need Assistance Dress Self Completely Able Feed Self Completely Able Walk Need Assistance Get In / Out Bed Completely Able Housework Not Able [name] [name] [name] ([id number]) [phone] [id number] Page 2 of 5 Prepare Meals Not Able Handle Money Not Able Shop for  Self Not Able Electronic Signature(s) Signed: 07/29/2023 3:37:08 PM By: [name] Pax RN Entered By: [name] [name] on 07/23/2023 07:08:07 -------------------------------------------------------------------------------- Education Screening Details Patient Name: Date of Service: [name] [name] RO LD [name]. 07/23/2023 9:30 [name] [name] Medical Record Number: [id number] Patient Account Number: [id number] Date of Birth/Sex: Treating RN: [birth year] (82 y.o. [name] [name]) [name] [name] Primary Care [name] [name]: [name] [name] Other Clinician: Referring Kaceton Vieau: Treating [name] [name]/Extender: [name] [name] Self, Referral Weeks in Treatment: 0 Primary Learner Assessed: Patient Learning Preferences/Education Level/Primary Language Learning Preference: Explanation Highest Education Level: College or Above Preferred Language: English Cognitive Barrier Language Barrier: No Translator Needed: No Memory Deficit: No Emotional Barrier: No Cultural/Religious Beliefs Affecting Medical Care: No Physical Barrier Impaired Vision: Yes Glasses Impaired Hearing: No Decreased Hand dexterity: No Knowledge/Comprehension Knowledge Level: Medium Comprehension Level: High Ability to understand written instructions: High Ability to understand verbal instructions: High Motivation Anxiety Level: Anxious Cooperation: Cooperative Education Importance: Acknowledges Need Interest in Health Problems: Asks Questions Perception: Coherent Willingness to Engage in Self-Management High Activities: Readiness to Engage in Self-Management High Activities: Electronic Signature(s) Signed: 07/29/2023 3:37:08 PM By: [name] Pax RN Entered By: [name] [name] on 07/23/2023 07:08:48 [name] [name] ([id number]) [id number] Page 3 of 5 -------------------------------------------------------------------------------- Fall Risk Assessment Details Patient Name: Date of Service: [name] [name] RO LD [name]. 07/23/2023 9:30 [name]  [name] Medical Record Number: [id number] Patient Account Number: [id number] Date of Birth/Sex: Treating RN: 08-10-1940 (82 y.o. [name] [name]) [name] [name] Primary Care [name] [name]: [name] [name] Other Clinician: Referring [name] [name]: Treating [name] [name]/Extender: [name] [name] Self, Referral Weeks in Treatment: 0 Fall Risk Assessment Items Have you had 2 or more [name] in the last 12 monthso 0 No Have you had any fall that resulted in injury in the last 12 monthso 0 No [name] [name] History of falling - immediate or within 3 months 0 No Secondary diagnosis (Do you have 2 or  more medical diagnoseso) 0 No Ambulatory aid None/bed rest/wheelchair/nurse 0 Yes Crutches/cane/walker 0 No Furniture 0 No Intravenous therapy Access/Saline/Heparin Lock 0 No Gait/Transferring Normal/ bed rest/ wheelchair 0 Yes Weak (short steps with or without shuffle, stooped but able to lift head while walking, may seek 0 No support from furniture) Impaired (short steps with shuffle, may have difficulty arising from chair, head down, impaired 0 No balance) Mental Status Oriented to own ability 0 Yes Electronic Signature(s) Signed: 07/29/2023 3:37:08 PM By: [name] Pax RN Entered By: [name] [name] on 07/23/2023 07:08:59 -------------------------------------------------------------------------------- Foot Assessment Details Patient Name: Date of Service: [name] [name] RO LD [name]. 07/23/2023 9:30 [name] [name] Medical Record Number: [id number] Patient Account Number: [id number] Date of Birth/Sex: Treating RN: Dec 06, 1940 (82 y.o. [name] [name]) [name] [name] Primary Care [name] [name]: [name] [name] Other Clinician: Referring [name] [name]: Treating [name] [name]/Extender: [name] [name] Self, Referral Weeks in Treatment: 0 Foot Assessment Items Site Locations [name] [name], [name] [name] ([id number]) [phone] [id number] Page 4 of 5 + = Sensation present, - = Sensation absent, C = Callus, U = Ulcer R = Redness, W = Warmth, [name] = Maceration, PU =  Pre-ulcerative lesion F = Fissure, S = Swelling, D = Dryness Assessment Right: Left: Other Deformity: No No Prior Foot Ulcer: No No Prior Amputation: No No Charcot Joint: No No Ambulatory Status: Ambulatory With Help Assistance Device: Walker Gait: Surveyor, mining) Signed: 07/29/2023 3:37:08 PM By: [name] Pax RN Entered By: [name] [name] on 07/23/2023 07:09:24 -------------------------------------------------------------------------------- Nutrition Risk Screening Details Patient Name: Date of Service: [name] [name] RO LD [name]. 07/23/2023 9:30 [name] [name] Medical Record Number: [id number] Patient Account Number: [id number] Date of Birth/Sex: Treating RN: April 23, 1941 (82 y.o. [name] [name]) [name] [name] Primary Care Davell Beckstead: [name] [name] Other Clinician: Referring Ambika Zettlemoyer: Treating Marlow Hendrie/Extender: [name] [name] Self, Referral Weeks in Treatment: 0 Height (in): 70 Weight (lbs): 152 Body Mass Index (BMI): 21.8 Nutrition Risk Screening Items Score Screening NUTRITION RISK [name]: I have an illness or condition that made me change the kind and/or amount of food I eat 0 No I eat fewer than two meals per day 0 No I eat few fruits and vegetables, or milk products 0 No I have three or more drinks of beer, liquor or wine almost every day 0 No I have tooth or mouth problems that make it hard for me to eat 0 No [name] [name], [name] [name] ([id number]) [id number] Page 5 of 5 I don't always have enough money to buy the food I need 0 No I eat alone most of the time 0 No I take three or more different prescribed or over-the-counter drugs [name] day 1 Yes Without wanting to, I have lost or gained 10 pounds in the last six months 0 No I am not always physically able to shop, cook and/or feed myself 0 No Nutrition Protocols Good Risk Protocol 0 No interventions needed Moderate Risk Protocol High Risk Proctocol Risk Level: Good Risk Score: 1 Electronic  Signature(s) Signed: 07/29/2023 3:37:08 PM By: [name] Pax RN Entered By: [name] [name] on 07/23/2023 07:09:11

## 2023-07-31 ENCOUNTER — Encounter: Payer: Self-pay | Admitting: Internal Medicine

## 2023-07-31 ENCOUNTER — Encounter: Payer: Self-pay | Admitting: Student in an Organized Health Care Education/Training Program

## 2023-08-01 ENCOUNTER — Other Ambulatory Visit: Payer: Self-pay | Admitting: Internal Medicine

## 2023-08-01 DIAGNOSIS — J431 Panlobular emphysema: Secondary | ICD-10-CM

## 2023-08-01 MED ORDER — APIXABAN 5 MG PO TABS: 5.0000 mg | ORAL_TABLET | Freq: Two times a day (BID) | ORAL | 3 refills | Status: DC

## 2023-08-01 MED ORDER — BREZTRI AEROSPHERE 160-9-4.8 MCG/ACT IN AERO: 2.0000 | INHALATION_SPRAY | Freq: Two times a day (BID) | RESPIRATORY_TRACT | 6 refills | Status: DC

## 2023-08-02 DIAGNOSIS — R278 Other lack of coordination: Secondary | ICD-10-CM | POA: Diagnosis not present

## 2023-08-02 DIAGNOSIS — I2510 Atherosclerotic heart disease of native coronary artery without angina pectoris: Secondary | ICD-10-CM | POA: Diagnosis not present

## 2023-08-02 DIAGNOSIS — J431 Panlobular emphysema: Secondary | ICD-10-CM | POA: Diagnosis not present

## 2023-08-02 DIAGNOSIS — J441 Chronic obstructive pulmonary disease with (acute) exacerbation: Secondary | ICD-10-CM | POA: Diagnosis not present

## 2023-08-02 DIAGNOSIS — I4891 Unspecified atrial fibrillation: Secondary | ICD-10-CM | POA: Diagnosis not present

## 2023-08-02 DIAGNOSIS — R2689 Other abnormalities of gait and mobility: Secondary | ICD-10-CM | POA: Diagnosis not present

## 2023-08-02 DIAGNOSIS — Z9981 Dependence on supplemental oxygen: Secondary | ICD-10-CM | POA: Diagnosis not present

## 2023-08-02 DIAGNOSIS — M6281 Muscle weakness (generalized): Secondary | ICD-10-CM | POA: Diagnosis not present

## 2023-08-02 DIAGNOSIS — Z741 Need for assistance with personal care: Secondary | ICD-10-CM | POA: Diagnosis not present

## 2023-08-02 DIAGNOSIS — I4811 Longstanding persistent atrial fibrillation: Secondary | ICD-10-CM | POA: Diagnosis not present

## 2023-08-02 DIAGNOSIS — J9621 Acute and chronic respiratory failure with hypoxia: Secondary | ICD-10-CM | POA: Diagnosis not present

## 2023-08-02 NOTE — Telephone Encounter (Signed)
 Last filled 07-04-23 #30 Last OV 07-15-23 Next OV 01-13-24 [facility]

## 2023-08-05 DIAGNOSIS — I4891 Unspecified atrial fibrillation: Secondary | ICD-10-CM | POA: Diagnosis not present

## 2023-08-05 DIAGNOSIS — I2510 Atherosclerotic heart disease of native coronary artery without angina pectoris: Secondary | ICD-10-CM | POA: Diagnosis not present

## 2023-08-05 DIAGNOSIS — J431 Panlobular emphysema: Secondary | ICD-10-CM | POA: Diagnosis not present

## 2023-08-05 DIAGNOSIS — R278 Other lack of coordination: Secondary | ICD-10-CM | POA: Diagnosis not present

## 2023-08-05 DIAGNOSIS — R2689 Other abnormalities of gait and mobility: Secondary | ICD-10-CM | POA: Diagnosis not present

## 2023-08-05 DIAGNOSIS — I4811 Longstanding persistent atrial fibrillation: Secondary | ICD-10-CM | POA: Diagnosis not present

## 2023-08-05 DIAGNOSIS — J9621 Acute and chronic respiratory failure with hypoxia: Secondary | ICD-10-CM | POA: Diagnosis not present

## 2023-08-05 DIAGNOSIS — M6281 Muscle weakness (generalized): Secondary | ICD-10-CM | POA: Diagnosis not present

## 2023-08-05 DIAGNOSIS — J441 Chronic obstructive pulmonary disease with (acute) exacerbation: Secondary | ICD-10-CM | POA: Diagnosis not present

## 2023-08-05 DIAGNOSIS — Z9981 Dependence on supplemental oxygen: Secondary | ICD-10-CM | POA: Diagnosis not present

## 2023-08-05 DIAGNOSIS — Z741 Need for assistance with personal care: Secondary | ICD-10-CM | POA: Diagnosis not present

## 2023-08-07 ENCOUNTER — Encounter: Payer: No Typology Code available for payment source | Attending: Physician Assistant | Admitting: Physician Assistant

## 2023-08-07 DIAGNOSIS — I4891 Unspecified atrial fibrillation: Secondary | ICD-10-CM | POA: Diagnosis not present

## 2023-08-07 DIAGNOSIS — S81811A Laceration without foreign body, right lower leg, initial encounter: Secondary | ICD-10-CM | POA: Insufficient documentation

## 2023-08-07 DIAGNOSIS — R2689 Other abnormalities of gait and mobility: Secondary | ICD-10-CM | POA: Diagnosis not present

## 2023-08-07 DIAGNOSIS — J441 Chronic obstructive pulmonary disease with (acute) exacerbation: Secondary | ICD-10-CM | POA: Diagnosis not present

## 2023-08-07 DIAGNOSIS — X58XXXA Exposure to other specified factors, initial encounter: Secondary | ICD-10-CM | POA: Insufficient documentation

## 2023-08-07 DIAGNOSIS — Z9981 Dependence on supplemental oxygen: Secondary | ICD-10-CM | POA: Diagnosis not present

## 2023-08-07 DIAGNOSIS — I2510 Atherosclerotic heart disease of native coronary artery without angina pectoris: Secondary | ICD-10-CM | POA: Diagnosis not present

## 2023-08-07 DIAGNOSIS — M6281 Muscle weakness (generalized): Secondary | ICD-10-CM | POA: Diagnosis not present

## 2023-08-07 DIAGNOSIS — J431 Panlobular emphysema: Secondary | ICD-10-CM | POA: Diagnosis not present

## 2023-08-07 DIAGNOSIS — I4811 Longstanding persistent atrial fibrillation: Secondary | ICD-10-CM | POA: Diagnosis not present

## 2023-08-07 DIAGNOSIS — I10 Essential (primary) hypertension: Secondary | ICD-10-CM | POA: Insufficient documentation

## 2023-08-07 DIAGNOSIS — R278 Other lack of coordination: Secondary | ICD-10-CM | POA: Diagnosis not present

## 2023-08-07 DIAGNOSIS — L89313 Pressure ulcer of right buttock, stage 3: Secondary | ICD-10-CM | POA: Insufficient documentation

## 2023-08-07 DIAGNOSIS — J4489 Other specified chronic obstructive pulmonary disease: Secondary | ICD-10-CM | POA: Insufficient documentation

## 2023-08-07 DIAGNOSIS — Z741 Need for assistance with personal care: Secondary | ICD-10-CM | POA: Diagnosis not present

## 2023-08-07 DIAGNOSIS — J9621 Acute and chronic respiratory failure with hypoxia: Secondary | ICD-10-CM | POA: Diagnosis not present

## 2023-08-07 NOTE — Progress Notes (Signed)
 ATREYU, MAK [name] ([id number]) [id number] Page 1 of 9 Visit Report for 08/07/2023 Arrival Information Details Patient Name: Date of Service: [name] [name]. 08/07/2023 10:15 [name] [name] Medical Record Number: [id number] Patient Account Number: [id number] Date of Birth/Sex: Treating RN: [birth year] (83 y.o. [name] [name]) [name] Primary Care Mohammed Mcandrew: Jimmy Ade Other Clinician: Referring Murle Otting: Treating Luticia Tadros/Extender: Bethena Andre Jimmy Ade Devra in Treatment: 2 Visit Information History Since Last Visit Added or deleted any medications: No Patient Arrived: Wheel Chair Any new allergies or adverse reactions: No Arrival Time: 10:14 Had [name] fall or experienced change in No Accompanied By: wife activities of daily living that may affect Transfer Assistance: None risk of falls: Patient Identification Verified: Yes Signs or symptoms of abuse/neglect since last visito No Secondary Verification Process Completed: Yes Hospitalized since last visit: No Patient Requires Transmission-Based Precautions: No Implantable device outside of the clinic excluding No Patient Has Alerts: Yes cellular tissue based products placed in the center Patient Alerts: Patient on Blood Thinner since last visit: Has Dressing in Place as Prescribed: Yes Pain Present Now: No Electronic Signature(s) Signed: 08/07/2023 3:46:47 PM By: [name] RN Entered By: [name] on 08/07/2023 07:14:44 -------------------------------------------------------------------------------- Clinic Level of Care Assessment Details Patient Name: Date of Service: [name] [name]. 08/07/2023 10:15 [name] [name] Medical Record Number: [id number] Patient Account Number: [id number] Date of Birth/Sex: Treating RN: 10-11-1940 (83 y.o. [name] [name]) [name] Primary Care Melton Walls: Jimmy Ade Other Clinician: Referring Colbey Wirtanen: Treating Stephens Shreve/Extender: Bethena Andre Jimmy Ade Devra in Treatment: 2 Clinic  Level of Care Assessment Items TOOL 4 Quantity Score X- 1 0 Use when only an EandM is performed on FOLLOW-UP visit ASSESSMENTS - Nursing Assessment / Reassessment X- 1 10 Reassessment of Co-morbidities (includes updates in patient status) X- 1 5 Reassessment of Adherence to Treatment Plan DALONTE, [name] [name] ([id number]) [id number] Page 2 of 9 ASSESSMENTS - Wound and Skin [name] ssessment / Reassessment []  - Simple Wound Assessment / Reassessment - one wound 0 X- 2 5 Complex Wound Assessment / Reassessment - multiple wounds []  - 0 Dermatologic / Skin Assessment (not related to wound area) ASSESSMENTS - Focused Assessment []  - 0 Circumferential Edema Measurements - multi extremities []  - 0 Nutritional Assessment / Counseling / Intervention []  - 0 Lower Extremity Assessment (monofilament, tuning fork, pulses) []  - 0 Peripheral Arterial Disease Assessment (using hand held doppler) ASSESSMENTS - Ostomy and/or Continence Assessment and Care []  - 0 Incontinence Assessment and Management []  - 0 Ostomy Care Assessment and Management (repouching, etc.) PROCESS - Coordination of Care X - Simple Patient / Family Education for ongoing care 1 15 []  - 0 Complex (extensive) Patient / Family Education for ongoing care []  - 0 Staff obtains Chiropractor, Records, T Results / Process Orders est []  - 0 Staff telephones HHA, Nursing Homes / Clarify orders / etc []  - 0 Routine Transfer to another Facility (non-emergent condition) []  - 0 Routine Hospital Admission (non-emergent condition) []  - 0 New Admissions / Manufacturing Engineer / Ordering NPWT Apligraf, etc. , []  - 0 Emergency Hospital Admission (emergent condition) X- 1 10 Simple Discharge Coordination []  - 0 Complex (extensive) Discharge Coordination PROCESS - Special Needs []  - 0 Pediatric / Minor Patient Management []  - 0 Isolation Patient Management []  - 0 Hearing / Language / Visual special needs []  -  0 Assessment of Community assistance (transportation, D/C planning, etc.) []  - 0 Additional assistance / Altered mentation []  - 0 Support Surface(s) Assessment (bed, cushion, seat, etc.)  INTERVENTIONS - Wound Cleansing / Measurement []  - 0 Simple Wound Cleansing - one wound []  - 0 Complex Wound Cleansing - multiple wounds X- 1 5 Wound Imaging (photographs - any number of wounds) []  - 0 Wound Tracing (instead of photographs) []  - 0 Simple Wound Measurement - one wound []  - 0 Complex Wound Measurement - multiple wounds INTERVENTIONS - Wound Dressings []  - 0 Small Wound Dressing one or multiple wounds []  - 0 Medium Wound Dressing one or multiple wounds []  - 0 Large Wound Dressing one or multiple wounds []  - 0 Application of Medications - topical []  - 0 Application of Medications - injection INTERVENTIONS - Miscellaneous []  - 0 External ear exam [name], LUHMANN [name] ([id number]) [id number] Page 3 of 9 []  - 0 Specimen Collection (cultures, biopsies, blood, body fluids, etc.) []  - 0 Specimen(s) / Culture(s) sent or taken to Lab for analysis []  - 0 Patient Transfer (multiple staff / Deitra Lift / Similar devices) []  - 0 Simple Staple / Suture removal (25 or less) []  - 0 Complex Staple / Suture removal (26 or more) []  - 0 Hypo / Hyperglycemic Management (close monitor of Blood Glucose) []  - 0 Ankle / Brachial Index (ABI) - do not check if billed separately X- 1 5 Vital Signs Has the patient been seen at the hospital within the last three years: Yes Total Score: 60 Level Of Care: New/Established - Level 2 Electronic Signature(s) Signed: 08/07/2023 3:46:47 PM By: [name] RN Entered By: [name] on 08/07/2023 07:59:32 -------------------------------------------------------------------------------- Encounter Discharge Information Details Patient Name: Date of Service: [name] [name]. 08/07/2023 10:15 [name] [name] Medical Record Number:  [id number] Patient Account Number: [id number] Date of Birth/Sex: Treating RN: November 04, 1940 (83 y.o. [name] [name] [name] Primary Care Kayline Sheer: Jimmy Ade Other Clinician: Referring Eryk Beavers: Treating Nikya Busler/Extender: Bethena Andre Jimmy Ade Devra in Treatment: 2 Encounter Discharge Information Items Discharge Condition: Stable Ambulatory Status: Wheelchair Discharge Destination: Home Transportation: Private Auto Accompanied By: wife Schedule Follow-up Appointment: No Clinical Summary of Care: Electronic Signature(s) Signed: 08/07/2023 11:01:09 AM By: [name] RN Entered By: [name] on 08/07/2023 08:01:08 -------------------------------------------------------------------------------- Lower Extremity Assessment Details Patient Name: Date of Service: [name] [name]. 08/07/2023 10:15 [name] YIGIT, NORKUS [name] ([id number]) [id number] Page 4 of 9 Medical Record Number: [id number] Patient Account Number: [id number] Date of Birth/Sex: Treating RN: 09-04-1940 (83 y.o. [name] [name]) [name] Primary Care Aveion Nguyen: Jimmy Ade Other Clinician: Referring Zaylei Mullane: Treating Tayvia Faughnan/Extender: Bethena Andre Jimmy Ade Devra in Treatment: 2 Electronic Signature(s) Signed: 08/07/2023 3:46:47 PM By: [name] RN Entered By: [name] on 08/07/2023 07:20:50 -------------------------------------------------------------------------------- Multi Wound Chart Details Patient Name: Date of Service: [name] CINDERELLA, PICKETT RO LD [name]. 08/07/2023 10:15 [name] [name] Medical Record Number: [id number] Patient Account Number: [id number] Date of Birth/Sex: Treating RN: Jul 01, 1941 (83 y.o. [name] [name]) [name] Primary Care Benoit Meech: Jimmy Ade Other Clinician: Referring Johnte Portnoy: Treating Donta Fuster/Extender: Bethena Andre Jimmy Ade Devra in Treatment: 2 Vital Signs Height(in): 70 Pulse(bpm): 83 Weight(lbs): 152 Blood Pressure(mmHg): 114/58 Body Mass Index(BMI):  21.8 Temperature(F): 98 Respiratory Rate(breaths/min): 18 [1:Photos:] [N/[name]:N/[name]] Right Gluteus Right, Proximal, Anterior Lower Leg N/[name] Wound Location: Gradually Appeared Gradually Appeared N/[name] Wounding Event: Pressure Ulcer Skin T ear N/[name] Primary Etiology: Chronic Obstructive Pulmonary Chronic Obstructive Pulmonary N/[name] Comorbid History: Disease (COPD), Arrhythmia, Disease (COPD), Arrhythmia, Coronary Artery Disease, Coronary Artery Disease, Hypertension Hypertension 04/30/2023 07/01/2023 N/[name] Date Acquired: 2 2 N/[name] Weeks of Treatment: Open Open N/[name] Wound Status: No No N/[name] Wound Recurrence: 0x0x0 0x0x0  N/[name] Measurements L x W x D (cm) 0 0 N/[name] [name] (cm) : rea 0 0 N/[name] Volume (cm) : 100.00% 100.00% N/[name] % Reduction in Area: 100.00% 100.00% N/[name] % Reduction in Volume: Category/Stage III Full Thickness Without Exposed N/[name] Classification: Support Structures None Present None Present N/[name] Exudate Amount: None Present (0%) None Present (0%) N/[name] Granulation Amount: None Present (0%) None Present (0%) N/[name] Necrotic Amount: Fascia: No Fascia: No N/[name] Exposed Structures: Fat Layer (Subcutaneous Tissue): No Fat Layer (Subcutaneous Tissue): No Tendon: No Tendon: No Muscle: No Muscle: No Joint: No Joint: No JEDI, CATALFAMO [name] ([id number]) [id number] Page 5 of 9 Bone: No Bone: No Large (67-100%) Large (67-100%) N/[name] Epithelialization: Treatment Notes Electronic Signature(s) Signed: 08/07/2023 3:46:47 PM By: [name] RN Entered By: [name] on 08/07/2023 07:20:56 -------------------------------------------------------------------------------- Multi-Disciplinary Care Plan Details Patient Name: Date of Service: [name] [name]. 08/07/2023 10:15 [name] [name] Medical Record Number: [id number] Patient Account Number: [id number] Date of Birth/Sex: Treating RN: 19-Nov-1940 (83 y.o. [name] [name] [name] Primary Care Suhailah Kwan: Jimmy Ade Other Clinician: Referring  Kylene Zamarron: Treating Jacquelyne Quarry/Extender: Bethena Andre Jimmy, Ade Duos in Treatment: 2 Active Inactive Electronic Signature(s) Signed: 08/07/2023 10:59:58 AM By: [name] RN Entered By: [name] on 08/07/2023 07:59:58 -------------------------------------------------------------------------------- Pain Assessment Details Patient Name: Date of Service: [name] [name]. 08/07/2023 10:15 [name] [name] Medical Record Number: [id number] Patient Account Number: [id number] Date of Birth/Sex: Treating RN: Mar 24, 1941 (83 y.o. [name] [name] [name] Primary Care Carron Mcmurry: Jimmy Ade Other Clinician: Referring Tighe Gitto: Treating Hemi Chacko/Extender: Bethena Andre Jimmy Ade Duos in Treatment: 2 Active Problems Location of Pain Severity and Description of Pain Patient Has Paino No Site Locations [place] [name] ([id number]) [id number] Page 6 of 9 Pain Management and Medication Current Pain Management: Electronic Signature(s) Signed: 08/07/2023 3:46:47 PM By: [name] RN Entered By: [name] on 08/07/2023 07:15:52 -------------------------------------------------------------------------------- Patient/Caregiver Education Details Patient Name: Date of Service: [name] [name]. 1/8/2025andnbsp10:15 [name] [name] Medical Record Number: [id number] Patient Account Number: [id number] Date of Birth/Gender: Treating RN: September 27, 1940 (83 y.o. [name] [name] [name] Primary Care Physician: Jimmy Ade Other Clinician: Referring Physician: Treating Physician/Extender: Bethena Andre Jimmy Ade Duos in Treatment: 2 Education Assessment Education Provided To: Patient Education Topics Provided Wound/Skin Impairment: Handouts: Other: discharge instructions Methods: Explain/Verbal Responses: State content correctly Electronic Signature(s) Signed: 08/07/2023 3:46:47 PM By: [name] RN Entered By: [name] on 08/07/2023 08:00:24 [facility] ([id number])  [id number] Page 7 of 9 -------------------------------------------------------------------------------- Wound Assessment Details Patient Name: Date of Service: [name] [name]. 08/07/2023 10:15 [name] [name] Medical Record Number: [id number] Patient Account Number: [id number] Date of Birth/Sex: Treating RN: 08-25-1940 (83 y.o. [name] [name]) [name] Primary Care Kenneith Stief: Jimmy Ade Other Clinician: Referring Brelan Hannen: Treating Ryenn Howeth/Extender: Bethena Andre Jimmy Ade Duos in Treatment: 2 Wound Status Wound Number: 1 Primary Pressure Ulcer Etiology: Wound Location: Right Gluteus Wound Open Wounding Event: Gradually Appeared Status: Date Acquired: 04/30/2023 Comorbid Chronic Obstructive Pulmonary Disease (COPD), Arrhythmia, Weeks Of Treatment: 2 History: Coronary Artery Disease, Hypertension Clustered Wound: No Photos Wound Measurements Length: (cm) Width: (cm) Depth: (cm) Area: (cm) Volume: (cm) 0 % Reduction in Area: 100% 0 % Reduction in Volume: 100% 0 Epithelialization: Large (67-100%) 0 Tunneling: No 0 Undermining: No Wound Description Classification: Category/Stage III Exudate Amount: None Present Foul Odor After Cleansing: No Slough/Fibrino No Wound Bed Granulation Amount: None Present (0%) Exposed Structure Necrotic Amount: None Present (0%) Fascia Exposed: No Fat Layer (Subcutaneous Tissue) Exposed: No Tendon Exposed:  No Muscle Exposed: No Joint Exposed: No Bone Exposed: No Electronic Signature(s) Signed: 08/07/2023 3:46:47 PM By: [name] RN Entered By: [name] on 08/07/2023 07:20:11 [facility] ([id number]) [id number] Page 8 of 9 -------------------------------------------------------------------------------- Wound Assessment Details Patient Name: Date of Service: [name] [name]. 08/07/2023 10:15 [name] [name] Medical Record Number: [id number] Patient Account Number: [id number] Date of  Birth/Sex: Treating RN: Dec 08, 1940 (83 y.o. [name] [name]) [name] Primary Care Cire Deyarmin: Jimmy Ade Other Clinician: Referring Rowan Blaker: Treating Prabhnoor Ellenberger/Extender: Bethena Andre Jimmy Ade Devra in Treatment: 2 Wound Status Wound Number: 2 Primary Skin Tear Etiology: Wound Location: Right, Proximal, Anterior Lower Leg Wound Open Wounding Event: Gradually Appeared Status: Date Acquired: 07/01/2023 Comorbid Chronic Obstructive Pulmonary Disease (COPD), Arrhythmia, Weeks Of Treatment: 2 History: Coronary Artery Disease, Hypertension Clustered Wound: No Photos Wound Measurements Length: (cm) Width: (cm) Depth: (cm) Area: (cm) Volume: (cm) 0 % Reduction in Area: 100% 0 % Reduction in Volume: 100% 0 Epithelialization: Large (67-100%) 0 Tunneling: No 0 Undermining: No Wound Description Classification: Full Thickness Without Exposed Support Exudate Amount: None Present Structures Foul Odor After Cleansing: No Slough/Fibrino No Wound Bed Granulation Amount: None Present (0%) Exposed Structure Necrotic Amount: None Present (0%) Fascia Exposed: No Fat Layer (Subcutaneous Tissue) Exposed: No Tendon Exposed: No Muscle Exposed: No Joint Exposed: No Bone Exposed: No Electronic Signature(s) Signed: 08/07/2023 3:46:47 PM By: [name] RN Entered By: [name] on 08/07/2023 07:20:42 [facility] ([id number]) [id number] Page 9 of 9 -------------------------------------------------------------------------------- Vitals Details Patient Name: Date of Service: [name] [name]. 08/07/2023 10:15 [name] [name] Medical Record Number: [id number] Patient Account Number: [id number] Date of Birth/Sex: Treating RN: October 12, 1940 (83 y.o. [name] [name]) [name] Primary Care Jaedin Trumbo: Jimmy Ade Other Clinician: Referring Taegan Standage: Treating Lorean Ekstrand/Extender: Bethena Andre Jimmy Ade Devra in Treatment: 2 Vital Signs Time Taken: 10:15 Temperature (F): 98 Height  (in): 70 Pulse (bpm): 83 Weight (lbs): 152 Respiratory Rate (breaths/min): 18 Body Mass Index (BMI): 21.8 Blood Pressure (mmHg): 114/58 Reference Range: 80 - 120 mg / dl Electronic Signature(s) Signed: 08/07/2023 3:46:47 PM By: [name] RN Entered By: [name] on 08/07/2023 07:15:38

## 2023-08-07 NOTE — Progress Notes (Addendum)
 [name] [name] ([id number]) [id number] Page 1 of 6 Visit Report for 08/07/2023 Chief Complaint Document Details Patient Name: Date of Service: [name] [name] [name]. 08/07/2023 10:15 [name] Medical Record Number: [id number] Patient Account Number: [id number] Date of Birth/Sex: Treating RN: 16-Dec-1940 (83 y.o. [name]) Alverta [name] Primary Care Provider: Jimmy Ade Other Clinician: Referring Provider: Treating Provider/Extender: Bethena Andre Jimmy Ade Devra in Treatment: 2 Information Obtained from: Patient Chief Complaint Right buttock pressure ulcer and right lower leg skin tear Electronic Signature(s) Signed: 08/07/2023 10:42:40 AM By: Bethena Andre PA-C Entered By: Bethena Andre on 08/07/2023 07:42:40 -------------------------------------------------------------------------------- HPI Details Patient Name: Date of Service: [name] [name] [name]. 08/07/2023 10:15 [name] Medical Record Number: [id number] Patient Account Number: [id number] Date of Birth/Sex: Treating RN: 09/25/1940 (83 y.o. [name] [name] Primary Care Provider: Jimmy Ade Other Clinician: Referring Provider: Treating Provider/Extender: Bethena Andre Jimmy Ade Devra in Treatment: 2 History of Present Illness HPI Description: 07-23-23 on evaluation today. Patient appears to be doing well currently regard to his wounds. He actually has one in the gluteal region and one on his on his right anterior lower leg. Both actually appear to be doing quite well, and in fact seem to be very close to completely resolved, especially the leg. Obviously, they were both much worse at one point but have dramatically improved since inception. He doesn't really have any pain at the lead but does have some discomfort in the gluteal/sacral region. Patient has [name] history of COPD and hypertension, but does not appear to have any other contributing major medical factors. 08-06-2022 upon evaluation today patient appears to  be doing excellent in regard to his wounds. In fact everything appears to be completely healed. I do not see any signs of active infection at this time which is excellent news as well. Electronic Signature(s) Signed: 08/07/2023 10:58:29 AM By: Bethena Andre PA-C Entered By: Bethena Andre on 08/07/2023 07:58:29 [employer] ([id number]) [id number] Page 2 of 6 -------------------------------------------------------------------------------- Physical Exam Details Patient Name: Date of Service: [name] [name] [name]. 08/07/2023 10:15 [name] Medical Record Number: [id number] Patient Account Number: [id number] Date of Birth/Sex: Treating RN: 03-Oct-1940 (83 y.o. [name]) Alverta [name] Primary Care Provider: Jimmy Ade Other Clinician: Referring Provider: Treating Provider/Extender: Bethena Andre Jimmy Ade Devra in Treatment: 2 Constitutional Well-nourished and well-hydrated in no acute distress. Respiratory labored, rapid respiration. Psychiatric this patient is able to make decisions and demonstrates good insight into disease process. Alert and Oriented x 3. pleasant and cooperative. Notes Upon inspection patient's wounds appear to be completely healed based on what I am seeing I do not see any signs of active infection at this time which is great news. Electronic Signature(s) Signed: 08/07/2023 11:00:00 AM By: Bethena Andre PA-C Previous Signature: 08/07/2023 10:59:38 AM Version By: Bethena Andre PA-C Entered By: Bethena Andre on 08/07/2023 08:00:00 -------------------------------------------------------------------------------- Physician Orders Details Patient Name: Date of Service: [name] [name] [name]. 08/07/2023 10:15 [name] Medical Record Number: [id number] Patient Account Number: [id number] Date of Birth/Sex: Treating RN: May 06, 1941 (83 y.o. 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, stage 3 S81.811A Laceration without foreign body, right lower leg, initial encounter I10 Essential (primary) hypertension J44.89 Other  specified chronic obstructive pulmonary disease XYLER, TERPENING [name] ([id number]) [id number] Page 3 of 6 [facility]charge From [facility] Discharge from Wound Care Center Treatment Complete - continue calmospetine times 1 month then d/c Electronic Signature(s) Signed: 08/07/2023 3:46:47 PM By: [name] RN Signed: 08/07/2023 6:16:28 PM By: Bethena Ferraris PA-C Entered By: Alverta [name] on 08/07/2023 07:57:10 -------------------------------------------------------------------------------- Problem List Details Patient Name: Date of Service: [name] [name] [name]. 08/07/2023 10:15 [name] Medical Record Number: [id number] Patient Account Number: [id number] Date of Birth/Sex: Treating RN: 04/04/1941 (83 y.o. 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, stage 3 07/23/2023 No Yes S81.811A Laceration without foreign body, right lower leg, initial encounter 07/23/2023 No Yes I10 Essential (primary) hypertension 07/23/2023 No Yes J44.89 Other specified chronic obstructive pulmonary disease 07/23/2023 No Yes Inactive Problems Resolved Problems Electronic Signature(s) Signed: 08/07/2023 10:42:37 AM By: Bethena Ferraris PA-C Entered By: Bethena Ferraris on 08/07/2023 07:42:37 [employer] ([id number]) [id number] Page 4 of  6 -------------------------------------------------------------------------------- Progress Note Details Patient Name: Date of Service: [name] [name] [name]. 08/07/2023 10:15 [name] Medical Record Number: [id number] Patient Account Number: [id number] Date of Birth/Sex: Treating RN: 09/07/1940 (82 y.o. [name]) Alverta [name] Primary Care Provider: Jimmy Ade Other Clinician: Referring Provider: Treating Provider/Extender: Bethena Ferraris Jimmy Ade Duos in Treatment: 2 Subjective Chief Complaint Information obtained from Patient Right buttock pressure ulcer and right lower leg skin tear History of Present Illness (HPI) 07-23-23 on evaluation today. Patient appears to be doing well currently regard to his wounds. He actually has one in the gluteal region and one on his on his right anterior lower leg. Both actually appear to be doing quite well, and in fact seem to be very close to completely resolved, especially the leg. Obviously, they were both much worse at one point but have dramatically improved since inception. He doesn't really have any pain at the lead but does have some discomfort in the gluteal/sacral region. Patient has [name] history of COPD and hypertension, but does not appear to have any other contributing major medical factors. 08-06-2022 upon evaluation today patient appears to be doing excellent in regard to his wounds. In fact everything appears to be completely healed. I do not see any signs of active infection at this time which is excellent news as well. Objective Constitutional Well-nourished and well-hydrated in no acute distress. Vitals Time Taken: 10:15 AM, Height: 70 in, Weight: 152 lbs, BMI: 21.8, Temperature: 98 F, Pulse: 83 bpm, Respiratory Rate: 18 breaths/min, Blood Pressure: 114/58 mmHg. Respiratory labored, rapid respiration. Psychiatric this patient is able to make decisions and demonstrates good insight into disease process. Alert and Oriented x 3. pleasant and  cooperative. General Notes: Upon inspection patient's wounds appear to be completely healed based on what I am seeing I do not see any signs of active infection at this time which is great news. Integumentary (Hair, Skin) Wound #1 status is Open. Original cause of wound was Gradually Appeared. The date acquired was: 04/30/2023. The wound has been in treatment 2 weeks. The wound is located on the Right Gluteus. The wound measures 0cm length x 0cm width x 0cm depth; 0cm^2 area and 0cm^3 volume. There is no tunneling or undermining noted. There is [name] none present amount of drainage noted. There is no granulation within the wound bed. There is  no necrotic tissue within the wound bed. Wound #2 status is Open. Original cause of wound was Gradually Appeared. The date acquired was: 07/01/2023. The wound has been in treatment 2 weeks. The wound is located on the Right,Proximal,Anterior Lower Leg. The wound measures 0cm length x 0cm width x 0cm depth; 0cm^2 area and 0cm^3 volume. There is no tunneling or undermining noted. There is [name] none present amount of drainage noted. There is no granulation within the wound bed. There is no necrotic tissue within the wound bed. Assessment Active Problems NICKLOS, GAXIOLA [name] ([id number]) [id number] Page 5 of 6 ICD-10 Pressure ulcer of right buttock, stage 3 Laceration without foreign body, right lower leg, initial encounter Essential (primary) hypertension Other specified chronic obstructive pulmonary disease Plan Discharge From [facility] Services: Discharge from Wound Care Center Treatment Complete - continue calmospetine times 1 month then d/c 1. Based on what I am seeing I am going to recommend that we go ahead and discontinue wound care services as the patient appears to be completely healed at both locations. 2. I am going to recommend that with regard to his leg/knee area he does not need any dressing on this. With regard to his wound on the  sacral area this appears to be T oughening up and is doing quite well I do not see any signs of infection at the moment. I do feel like that he should continue to use some of the Calmoseptine in order to keep things under control from the standpoint of the sacral area. We will see the patient back for follow-up visit as needed. Electronic Signature(s) Signed: 08/07/2023 11:01:05 AM By: Bethena Ferraris PA-C Entered By: Bethena Ferraris on 08/07/2023 08:01:05 -------------------------------------------------------------------------------- SuperBill Details Patient Name: Date of Service: [name] [name] [name]. 08/07/2023 Medical Record Number: [id number] Patient Account Number: [id number] Date of Birth/Sex: Treating RN: 17-Feb-1941 (82 y.o. [name]) Alverta [name] Primary Care Provider: Jimmy Ade Other Clinician: Referring Provider: Treating Provider/Extender: Bethena Ferraris Jimmy Ade Devra in Treatment: 2 Diagnosis Coding ICD-10 Codes Code Description [phone] Pressure ulcer of right buttock, stage 3 S81.811A Laceration without foreign body, right lower leg, initial encounter I10 Essential (primary) hypertension J44.89 Other specified chronic obstructive pulmonary disease Facility Procedures : CPT4 Code: [id number] Description: 00787 - WOUND CARE VISIT-LEV 2 EST PT Modifier: Quantity: 1 Physician Procedures : CPT4 Code Description Modifier [id number] - WC PHYS LEVEL 3 - EST PT ICD-10 Diagnosis Description 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

## 2023-08-09 DIAGNOSIS — J431 Panlobular emphysema: Secondary | ICD-10-CM | POA: Diagnosis not present

## 2023-08-09 DIAGNOSIS — J9621 Acute and chronic respiratory failure with hypoxia: Secondary | ICD-10-CM | POA: Diagnosis not present

## 2023-08-09 DIAGNOSIS — I4811 Longstanding persistent atrial fibrillation: Secondary | ICD-10-CM | POA: Diagnosis not present

## 2023-08-09 DIAGNOSIS — I2510 Atherosclerotic heart disease of native coronary artery without angina pectoris: Secondary | ICD-10-CM | POA: Diagnosis not present

## 2023-08-09 DIAGNOSIS — Z9981 Dependence on supplemental oxygen: Secondary | ICD-10-CM | POA: Diagnosis not present

## 2023-08-09 DIAGNOSIS — I4891 Unspecified atrial fibrillation: Secondary | ICD-10-CM | POA: Diagnosis not present

## 2023-08-09 DIAGNOSIS — R278 Other lack of coordination: Secondary | ICD-10-CM | POA: Diagnosis not present

## 2023-08-09 DIAGNOSIS — Z741 Need for assistance with personal care: Secondary | ICD-10-CM | POA: Diagnosis not present

## 2023-08-09 DIAGNOSIS — M6281 Muscle weakness (generalized): Secondary | ICD-10-CM | POA: Diagnosis not present

## 2023-08-09 DIAGNOSIS — R2689 Other abnormalities of gait and mobility: Secondary | ICD-10-CM | POA: Diagnosis not present

## 2023-08-09 DIAGNOSIS — J441 Chronic obstructive pulmonary disease with (acute) exacerbation: Secondary | ICD-10-CM | POA: Diagnosis not present

## 2023-08-12 DIAGNOSIS — M6281 Muscle weakness (generalized): Secondary | ICD-10-CM | POA: Diagnosis not present

## 2023-08-12 DIAGNOSIS — I4891 Unspecified atrial fibrillation: Secondary | ICD-10-CM | POA: Diagnosis not present

## 2023-08-12 DIAGNOSIS — J9621 Acute and chronic respiratory failure with hypoxia: Secondary | ICD-10-CM | POA: Diagnosis not present

## 2023-08-12 DIAGNOSIS — Z9981 Dependence on supplemental oxygen: Secondary | ICD-10-CM | POA: Diagnosis not present

## 2023-08-12 DIAGNOSIS — Z741 Need for assistance with personal care: Secondary | ICD-10-CM | POA: Diagnosis not present

## 2023-08-12 DIAGNOSIS — I4811 Longstanding persistent atrial fibrillation: Secondary | ICD-10-CM | POA: Diagnosis not present

## 2023-08-12 DIAGNOSIS — J441 Chronic obstructive pulmonary disease with (acute) exacerbation: Secondary | ICD-10-CM | POA: Diagnosis not present

## 2023-08-12 DIAGNOSIS — I2510 Atherosclerotic heart disease of native coronary artery without angina pectoris: Secondary | ICD-10-CM | POA: Diagnosis not present

## 2023-08-12 DIAGNOSIS — R278 Other lack of coordination: Secondary | ICD-10-CM | POA: Diagnosis not present

## 2023-08-12 DIAGNOSIS — R2689 Other abnormalities of gait and mobility: Secondary | ICD-10-CM | POA: Diagnosis not present

## 2023-08-12 DIAGNOSIS — J431 Panlobular emphysema: Secondary | ICD-10-CM | POA: Diagnosis not present

## 2023-08-14 DIAGNOSIS — Z741 Need for assistance with personal care: Secondary | ICD-10-CM | POA: Diagnosis not present

## 2023-08-14 DIAGNOSIS — R2689 Other abnormalities of gait and mobility: Secondary | ICD-10-CM | POA: Diagnosis not present

## 2023-08-14 DIAGNOSIS — I4811 Longstanding persistent atrial fibrillation: Secondary | ICD-10-CM | POA: Diagnosis not present

## 2023-08-14 DIAGNOSIS — Z9981 Dependence on supplemental oxygen: Secondary | ICD-10-CM | POA: Diagnosis not present

## 2023-08-14 DIAGNOSIS — R278 Other lack of coordination: Secondary | ICD-10-CM | POA: Diagnosis not present

## 2023-08-14 DIAGNOSIS — J441 Chronic obstructive pulmonary disease with (acute) exacerbation: Secondary | ICD-10-CM | POA: Diagnosis not present

## 2023-08-14 DIAGNOSIS — I4891 Unspecified atrial fibrillation: Secondary | ICD-10-CM | POA: Diagnosis not present

## 2023-08-14 DIAGNOSIS — I2510 Atherosclerotic heart disease of native coronary artery without angina pectoris: Secondary | ICD-10-CM | POA: Diagnosis not present

## 2023-08-14 DIAGNOSIS — J9621 Acute and chronic respiratory failure with hypoxia: Secondary | ICD-10-CM | POA: Diagnosis not present

## 2023-08-14 DIAGNOSIS — M6281 Muscle weakness (generalized): Secondary | ICD-10-CM | POA: Diagnosis not present

## 2023-08-14 DIAGNOSIS — J431 Panlobular emphysema: Secondary | ICD-10-CM | POA: Diagnosis not present

## 2023-08-16 DIAGNOSIS — M6281 Muscle weakness (generalized): Secondary | ICD-10-CM | POA: Diagnosis not present

## 2023-08-16 DIAGNOSIS — J431 Panlobular emphysema: Secondary | ICD-10-CM | POA: Diagnosis not present

## 2023-08-16 DIAGNOSIS — I4891 Unspecified atrial fibrillation: Secondary | ICD-10-CM | POA: Diagnosis not present

## 2023-08-16 DIAGNOSIS — Z741 Need for assistance with personal care: Secondary | ICD-10-CM | POA: Diagnosis not present

## 2023-08-16 DIAGNOSIS — R278 Other lack of coordination: Secondary | ICD-10-CM | POA: Diagnosis not present

## 2023-08-16 DIAGNOSIS — R2689 Other abnormalities of gait and mobility: Secondary | ICD-10-CM | POA: Diagnosis not present

## 2023-08-16 DIAGNOSIS — I4811 Longstanding persistent atrial fibrillation: Secondary | ICD-10-CM | POA: Diagnosis not present

## 2023-08-16 DIAGNOSIS — J9621 Acute and chronic respiratory failure with hypoxia: Secondary | ICD-10-CM | POA: Diagnosis not present

## 2023-08-16 DIAGNOSIS — Z9981 Dependence on supplemental oxygen: Secondary | ICD-10-CM | POA: Diagnosis not present

## 2023-08-16 DIAGNOSIS — J441 Chronic obstructive pulmonary disease with (acute) exacerbation: Secondary | ICD-10-CM | POA: Diagnosis not present

## 2023-08-16 DIAGNOSIS — I2510 Atherosclerotic heart disease of native coronary artery without angina pectoris: Secondary | ICD-10-CM | POA: Diagnosis not present

## 2023-08-18 ENCOUNTER — Encounter: Payer: Self-pay | Admitting: Internal Medicine

## 2023-08-19 NOTE — Telephone Encounter (Signed)
Spoke to pt. Set up MyChart Video Visit for tomorrow at 11am.

## 2023-08-20 ENCOUNTER — Ambulatory Visit: Payer: Self-pay | Admitting: Internal Medicine

## 2023-08-20 ENCOUNTER — Telehealth (INDEPENDENT_AMBULATORY_CARE_PROVIDER_SITE_OTHER): Payer: Medicare Other | Admitting: Internal Medicine

## 2023-08-20 ENCOUNTER — Encounter: Payer: Self-pay | Admitting: Internal Medicine

## 2023-08-20 VITALS — BP 129/79 | HR 79 | Temp 97.6°F

## 2023-08-20 DIAGNOSIS — J441 Chronic obstructive pulmonary disease with (acute) exacerbation: Secondary | ICD-10-CM

## 2023-08-20 MED ORDER — PREDNISONE 20 MG PO TABS: 40.0000 mg | ORAL_TABLET | Freq: Every day | ORAL | 1 refills | Status: DC

## 2023-08-20 MED ORDER — AMOXICILLIN-POT CLAVULANATE 875-125 MG PO TABS: 1.0000 | ORAL_TABLET | Freq: Two times a day (BID) | ORAL | 0 refills | Status: DC

## 2023-08-20 NOTE — Telephone Encounter (Signed)
Chief Complaint: eye problem Symptoms: swelling to right eye and bleeding into the sclera of right eye Frequency: today Pertinent Negatives: Patient denies fever, eye pain Disposition: [] ED /[] Urgent Care (no appt availability in office) / [x] Appointment(In office/virtual)/ []  [facility]/ [] Home Care/ [] Refused Recommended Disposition /[] [facility]/ []  Follow-up with PCP Additional Notes: patient and wife called with concerns of swelling to right eye and bleeding in to the sclera of right eye. Wife states bleeding is on the right side of right eye opposite his nose. No pain. Eye eye is more "puffy." Initially Urgent Care was given as the recommendation to be seen today but after additional protocol applied, new recommendation was to be seen within 24 hours. Per protocol, appointment made for tomorrow 08/21/2023 at 11:00 am. Patient and wife verbalized understanding of plan and all questions answered.    Copied from CRM [id number]. Topic: Clinical - Red Word Triage >> Aug 20, 2023 12:23 PM [place] A wrote: [facility] that prompted transfer to Nurse Triage: Patient's wife called said patient eyes have blood in them and right eye is swollen-no pain Reason for Disposition  Bleeding on white of the eye  MODERATE-SEVERE eyelid swelling on one side  (Exception: Due to a mosquito bite.)  Answer Assessment - Initial Assessment Questions 1. ONSET: "When did the swelling start?" (e.g., minutes, hours, days)     Unsure of when it started 2. LOCATION: "What part of the eyelids is swollen?"     Right eye  3. SEVERITY: "How swollen is it?"     Right eye looks more puffy  4. ITCHING: "Is there any itching?" If Yes, ask: "How much?"   (Scale 1-10; mild, moderate or severe)     no 5. PAIN: "Is the swelling painful to touch?" If Yes, ask: "How painful is it?"   (Scale 1-10; mild, moderate or severe)     no 6. FEVER: "Do you have a fever?" If Yes, ask: "What is it, how was it measured,  and when did it start?"      no 7. CAUSE: "What do you think is causing the swelling?"     Unsure of what's causing 8. RECURRENT SYMPTOM: "Have you had eyelid swelling before?" If Yes, ask: "When was the last time?" "What happened that time?"     Doesn't believe so 9. OTHER SYMPTOMS: "Do you have any other symptoms?" (e.g., blurred vision, eye discharge, rash, runny nose)     Patient states whites of his eyes have blood in them  Answer Assessment - Initial Assessment Questions 1. LOCATION: Location: "What's red, the eyeball or the outer eyelids?" (Note: when callers say the eye is red, they usually mean the sclera is red)       The sclera is red on the right eye opposite his nose. 2. REDNESS OF SCLERA: "Is the redness in one or both eyes?" "When did the redness start?"      Patient just noticed the redness 3. ONSET: "When did the eye become red?" (e.g., hours, days)      Patient is unsure 4. EYELIDS: "Are the eyelids red or swollen?" If Yes, ask: "How much?"      No 5. VISION: "Is there any difficulty seeing clearly?"      No difficulties seeing 6. ITCHING: "Does it feel itchy?" If so ask: "How bad is it" (e.g., Scale 1-10; or mild, moderate, severe)     No itching 7. PAIN: "Is there any pain? If Yes, ask: "How  bad is it?" (e.g., Scale 1-10; or mild, moderate, severe)     No pain 8. CONTACT LENS: "Do you wear contacts?"     No 9. CAUSE: "What do you think is causing the redness?"     unsure 10. OTHER SYMPTOMS: "Do you have any other symptoms?" (e.g., fever, runny nose, cough, vomiting)       Swelling to right eye  Protocols used: Eye - Swelling-A-AH, Eye - Red Without Pus-A-AH

## 2023-08-20 NOTE — Progress Notes (Signed)
Subjective:    Patient ID: [name] [name]., male    DOB: May 16, 1941, 83 y.o.   MRN: [id number]  HPI Video virtual visit due to respiratory infection Identification done Reviewed limitations and billing and he gave consent Participants---patient in his home (wife is there also) and I am in my office  Started with illness 5 days ago or so Change in breathing---hard to do anything (took an hour to get dressed this morning due to dyspnea) No fever Only cough to bring up mucus No sore throat or ear pain of note No chills, sweats or myalgia----just feels a bit cold in evening when sun goes down  Using albuterol inhaler--as well as breztri Nebulizer tid ---albuterol with saline Not clearly helping  Oximetry has been okay on the oxygen at rest---but goes down with any activity (like walking) to the upper 80's---this is in general  Current Outpatient Medications on File Prior to Visit  Medication Sig Dispense Refill   acetaminophen (TYLENOL) 325 MG tablet Take 650 mg by mouth every 4 (four) hours as needed.     albuterol (VENTOLIN HFA) 108 (90 Base) MCG/ACT inhaler INHALE 2 PUFFS 2-3 TIMES A DAY AS NEEDED FOR WHEEZING (30 DAYS) 30 DAY(S) 8.5 each 3   apixaban (ELIQUIS) 5 MG TABS tablet Take 1 tablet (5 mg total) by mouth 2 (two) times daily. 180 tablet 3   Budeson-Glycopyrrol-Formoterol (BREZTRI AEROSPHERE) 160-9-4.8 MCG/ACT AERO Inhale 2 puffs into the lungs 2 (two) times daily. 10.7 g 6   clonazePAM (KLONOPIN) 0.5 MG tablet TAKE 1 TABLET BY MOUTH AT BEDTIME. 30 tablet 0   diltiazem (CARDIZEM CD) 120 MG 24 hr capsule Take 1 capsule (120 mg total) by mouth 2 (two) times daily. 180 capsule 3   Docusate Calcium (STOOL SOFTENER PO) Take 1 tablet by mouth every evening.     dofetilide (TIKOSYN) 250 MCG capsule Take 1 capsule (250 mcg total) by mouth 2 (two) times daily. 180 capsule 2   finasteride (PROSCAR) 5 MG tablet Take 5 mg by mouth daily.     furosemide (LASIX) 20 MG tablet Take 2  tablets (40 mg total) by mouth as directed. Take 2 tablets in the morning (40 mg) for 4 days. 180 tablet 3   gabapentin (NEURONTIN) 100 MG capsule Take 200 mg by mouth at bedtime.     ipratropium (ATROVENT) 0.03 % nasal spray Place 2 sprays into both nostrils every 12 (twelve) hours. 30 mL 12   ipratropium-albuterol (DUONEB) 0.5-2.5 (3) MG/3ML SOLN INHALE 3 ML BY NEBULIZER EVERY 6 HOURS AS NEEDED 360 mL 1   nitroGLYCERIN (NITROSTAT) 0.4 MG SL tablet Place 1 tablet (0.4 mg total) under the tongue every 5 (five) minutes as needed for chest pain. 25 tablet 4   OXYGEN Inhale into the lungs. 3-4 liters upon exertion and 3 liters continuous at night.     pantoprazole (PROTONIX) 40 MG tablet Take 1 tablet (40 mg total) by mouth daily. 90 tablet 3   polyethylene glycol (MIRALAX / GLYCOLAX) 17 g packet Take 17 g by mouth daily as needed for moderate constipation.     predniSONE (DELTASONE) 5 MG tablet Take 2 tablets (10 mg total) by mouth daily with breakfast. (Patient taking differently: Take 7.5 mg by mouth daily with breakfast.) 60 tablet 11   rosuvastatin (CRESTOR) 20 MG tablet Take 1 tablet (20 mg total) by mouth daily. 90 tablet 3   silver sulfADIAZINE (SILVADENE) 1 % cream Apply 1 Application topically daily. 50 g 0  sodium chloride HYPERTONIC 3 % nebulizer solution Take by nebulization in the morning and at bedtime. 750 mL 12   No current facility-administered medications on file prior to visit.    Allergies  Allergen Reactions   Flagyl [Metronidazole] Other (See Comments)    Other reaction(s): skin reaction    Past Medical History:  Diagnosis Date   Abdominal discomfort 12/07/2020   Acquired trigger finger of right middle finger 02/04/2020   Acute prostatitis 04/08/2018   Alcohol abuse    Alcohol dependence (HCC) 12/07/2020   Anal or rectal pain 04/08/2018   Annual physical exam 12/07/2020   Anorectal disorder 12/07/2020   Arthritis    Atherosclerotic heart disease of native  coronary artery without angina pectoris 04/08/2018   a. 03/2018 PCI: LM nl, LAD 19m, LCX nl, OM1 70p (2.75x8 Synergy DES), 29m (2.5x24 Synergy DES), RCA 25p, RPL1 50. EF 55-65%.   Atrial flutter (HCC)    a. 07/2022 in setting of hospitalization for resp illness/RSV. CHA2DS2VASc = 4-->eliquis; b. 08/2022 s/p DCCV (100J); c. 09/26/2022 Recurrent Aflutter.   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 02/2022 Carotid U/S: 1-39% bilat ICA stenoses.   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   Contracture of palmar fascia 12/07/2020   COPD (chronic obstructive pulmonary disease) (HCC)    Corn of toe 12/07/2020   Corns and callosities 04/09/2018   Coronary artery disease    2019 with stents   Coronary atherosclerosis 05/07/2018   Degenerative disc disease, 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   Degenerative disc disease, 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   Diarrhea 04/09/2018   Double vision with both eyes open 12/07/2020   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   Dysphagia 08/14/2018   Dyspnea    ED (erectile dysfunction)    ED (erectile dysfunction) of organic origin 12/07/2020   Elevated prostate specific antigen (PSA) 04/08/2018   Elevated PSA    Encounter for screening for other disorder 12/07/2020   Enlarged prostate without lower urinary tract symptoms (luts) 04/09/2018   Enthesopathy 12/07/2020   Essential (primary) hypertension 04/08/2018   Ex-smoker 04/09/2018   Exertional dyspnea 04/02/2018   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 07/2022 Echo: EF 65-70%, no rwma, mild LVH, nl RV fxn.   Hyperlipidemia    Hypertension    Hypoxemia 12/07/2020   Kidney cysts    Left knee pain 12/07/2020   Low back pain 04/08/2018   Male erectile dysfunction 04/09/2018   Mixed hyperlipidemia 04/08/2018   Nocturia more than twice per night 04/09/2018   Osteoarthritis of first carpometacarpal joint 04/08/2018   Osteoarthritis of knee 12/07/2020   Other long term (current) drug therapy 12/07/2020   Pain due to onychomycosis of toenail of left foot 07/20/2019   Pain in joint 04/09/2018   Pain in knee 04/09/2018   Pain in unspecified knee 12/07/2020   Pain of finger 04/09/2018  Palpitations    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HCC) 12/07/2020   Personal history of colonic polyps 12/07/2020   Pneumonia    Pneumothorax 04/12/2020   Polypharmacy 04/09/2018   Porokeratosis 01/13/2019   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HCC)    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   Pulmonary nodules 06/08/2016   Pure hypercholesterolemia 12/07/2020   Renal cyst 12/07/2020   Sleep disorder 04/08/2018   Status post bronchoscopy 04/12/2020   Tear of rotator cuff 04/09/2018   Tobacco user 12/07/2020   Uncomplicated alcohol dependence (HCC) 04/09/2018   Unspecified rotator cuff tear or rupture of unspecified shoulder, not specified as traumatic 12/07/2020   Unspecified tear of unspecified meniscus, current injury, unspecified knee, initial encounter 12/07/2020   Visual disturbance 12/07/2020   Vitamin D deficiency 04/08/2018    Past Surgical History:  Procedure Laterality Date   BRONCHIAL BIOPSY  10/13/2019   Procedure: BRONCHIAL BIOPSIES;  Surgeon: Leslye Peer, MD;  Location: [facility] ENDOSCOPY;  Service: Pulmonary;;   BRONCHIAL BIOPSY  04/12/2020   Procedure: BRONCHIAL  BIOPSIES;  Surgeon: Leslye Peer, MD;  Location: [facility] ENDOSCOPY;  Service: Pulmonary;;   BRONCHIAL BRUSHINGS  10/13/2019   Procedure: BRONCHIAL BRUSHINGS;  Surgeon: Leslye Peer, MD;  Location: [facility] ENDOSCOPY;  Service: Pulmonary;;   BRONCHIAL BRUSHINGS  04/12/2020   Procedure: BRONCHIAL BRUSHINGS;  Surgeon: Leslye Peer, MD;  Location: [facility] ENDOSCOPY;  Service: Pulmonary;;   BRONCHIAL NEEDLE ASPIRATION BIOPSY  10/13/2019   Procedure: BRONCHIAL NEEDLE ASPIRATION BIOPSIES;  Surgeon: Leslye Peer, MD;  Location: [facility] ENDOSCOPY;  Service: Pulmonary;;   BRONCHIAL NEEDLE ASPIRATION BIOPSY  04/12/2020   Procedure: BRONCHIAL NEEDLE ASPIRATION BIOPSIES;  Surgeon: Leslye Peer, MD;  Location: [facility] ENDOSCOPY;  Service: Pulmonary;;   BRONCHIAL WASHINGS  10/13/2019   Procedure: BRONCHIAL WASHINGS;  Surgeon: Leslye Peer, MD;  Location: [facility] ENDOSCOPY;  Service: Pulmonary;;   BRONCHIAL WASHINGS  04/12/2020   Procedure: BRONCHIAL WASHINGS;  Surgeon: Leslye Peer, MD;  Location: [facility] ENDOSCOPY;  Service: Pulmonary;;   CARDIAC CATHETERIZATION  2002   50% RCA   CARDIOVERSION N/A 09/17/2022   Procedure: CARDIOVERSION;  Surgeon: Iran Ouch, MD;  Location: [facility] ORS;  Service: Cardiovascular;  Laterality: N/A;   CATARACT EXTRACTION, BILATERAL     CORONARY STENT INTERVENTION N/A 04/15/2018   Procedure: CORONARY STENT INTERVENTION;  Surgeon: Corky Crafts, MD;  Location: [facility] INVASIVE CV LAB;  Service: Cardiovascular;  Laterality: N/A;  om1   EYE SURGERY     KNEE ARTHROSCOPY     LEFT HEART CATH AND CORONARY ANGIOGRAPHY N/A 04/15/2018   Procedure: LEFT HEART CATH AND CORONARY ANGIOGRAPHY;  Surgeon: Corky Crafts, MD;  Location: [facility] INVASIVE CV LAB;  Service: Cardiovascular;  Laterality: N/A;   MULTIPLE TOOTH EXTRACTIONS     TONSILLECTOMY     VIDEO BRONCHOSCOPY  04/12/2020   VIDEO BRONCHOSCOPY WITH ENDOBRONCHIAL NAVIGATION N/A 07/26/2016   Procedure: VIDEO BRONCHOSCOPY WITH ENDOBRONCHIAL  NAVIGATION;  Surgeon: Leslye Peer, MD;  Location: [facility] OR;  Service: Thoracic;  Laterality: N/A;   VIDEO BRONCHOSCOPY WITH ENDOBRONCHIAL NAVIGATION N/A 10/13/2019   Procedure: [facility] AND VIDEO BRONCHOSCOPY WITH ENDOBRONCHIAL NAVIGATION;  Surgeon: Leslye Peer, MD;  Location: [facility] ENDOSCOPY;  Service: Pulmonary;  Laterality: N/A;   VIDEO BRONCHOSCOPY WITH ENDOBRONCHIAL NAVIGATION N/A 04/12/2020   Procedure: VIDEO BRONCHOSCOPY WITH ENDOBRONCHIAL NAVIGATION;  Surgeon: Leslye Peer, MD;  Location: [facility] ENDOSCOPY;  Service: Pulmonary;  Laterality: N/A;    Family History  Problem Relation Age of Onset   Hypertension  Mother    Alzheimer's disease Mother     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  Tobacco Use   Smoking status: Former    Current packs/day: 0.00    Average packs/day: 0.8 packs/day for 68.0 years (51.0 ttl pk-yrs)    Types: Cigarettes    Start date: 89    Quit date: 07/13/2020    Years since quitting: 3.1    Passive exposure: Never   Smokeless tobacco: Never   Tobacco comments:    Recent Quit    Vaping Use   Vaping status: Former  Substance and Sexual Activity   Alcohol use: No    Alcohol/week: 0.0 standard drinks of alcohol    Comment: quit drinking 2014   Drug use: No   Sexual activity: Not on file  Other Topics Concern   Not on file  Social History Narrative   Has living will   Wife is health care POA--alternate is son Jeanice Lim)   Has daughter in [place]   Has DNR   No tube feeds if cognitively unaware   Social Drivers of Health   Financial Resource Strain: Low Risk  (01/06/2023)   Overall Financial Resource Strain (CARDIA)    Difficulty of Paying Living Expenses:  Not hard at all  Food Insecurity: No Food Insecurity (06/01/2023)   Hunger Vital Sign    Worried About Running Out of Food in the Last Year: Never true    Ran Out of Food in the Last Year: Never true  Transportation Needs: No Transportation Needs (06/01/2023)   PRAPARE - Administrator, Civil Service (Medical): No    Lack of Transportation (Non-Medical): No  Physical Activity: Sufficiently Active (01/06/2023)   Exercise Vital Sign    Days of Exercise per Week: 4 days    Minutes of Exercise per Session: 40 min  Stress: No Stress Concern Present (01/06/2023)   [employer] of Occupational Health - Occupational Stress Questionnaire    Feeling of Stress : Not at all  Social Connections: Unknown (01/06/2023)   Social Connection and Isolation Panel [NHANES]    Frequency of Communication with Friends and Family: Never    Frequency of Social Gatherings with Friends and Family: Once a week    Attends Religious Services: More than 4 times per year    Active Member of [employer] or Organizations: Not on file    Attends Banker Meetings: Not on file    Marital Status: Married  Intimate Partner Violence: Not At Risk (06/01/2023)   Humiliation, Afraid, Rape, and Kick questionnaire    Fear of Current or Ex-Partner: No    Emotionally Abused: No    Physically Abused: No    Sexually Abused: No   Review of Systems Some chronic change in smell/taste since COVID in September No N/V Appetite is off     Objective:   Physical Exam Constitutional:      Appearance: Normal appearance.  Pulmonary:     Effort: Pulmonary effort is normal. No respiratory distress.  Neurological:     Mental Status: He is alert.            Assessment & Plan:

## 2023-08-20 NOTE — Assessment & Plan Note (Signed)
Acute illness for close to a week Likely viral but he is high risl/  Has been weaning down prednisone but does have wheezing and is tight. Will give prednisone 40mg  daily x 3, then 20mg  daily x 3--then back to 5mg  daily Augmentin 875 bid x 7 days Needs in person visit if not improving soon

## 2023-08-20 NOTE — Telephone Encounter (Signed)
Called and spoke to pt because he was just seen through a virtual visit with Dr Alphonsus Sias but this wasn't mentioned. His wife did not notice it until a little while ago.   I suggested he could go on [email or web address] and schedule another virtual. They decided they will wait and come in the office tomorrow. He has been sick recently and coughing more than usual. I did advise that if he starts to have a headache, facial issues, speech issues to call 911.

## 2023-08-21 ENCOUNTER — Telehealth: Payer: Medicare Other | Admitting: Internal Medicine

## 2023-08-21 ENCOUNTER — Encounter: Payer: Self-pay | Admitting: Internal Medicine

## 2023-08-21 ENCOUNTER — Encounter: Payer: Self-pay | Admitting: Student in an Organized Health Care Education/Training Program

## 2023-08-21 DIAGNOSIS — H1131 Conjunctival hemorrhage, right eye: Secondary | ICD-10-CM

## 2023-08-21 NOTE — Progress Notes (Signed)
Subjective:    Patient ID: [name] Register., male    DOB: 06-01-1941, 83 y.o.   MRN: [id number]  HPI Video virtual visit due to eye problems Identification done Reviewed limitations and billing and he gave consent Participants--patient and wife in their home--and I am in my office  Noted redness in eye yesterday --mostly right Thought it could be related to "violent" sneezing attacks Didn't notice it until after yesterday's visit No pain, swelling or discharge  Not as stuffy but still having "short" breathing  Current Outpatient Medications on File Prior to Visit  Medication Sig Dispense Refill   acetaminophen (TYLENOL) 325 MG tablet Take 650 mg by mouth every 4 (four) hours as needed.     albuterol (VENTOLIN HFA) 108 (90 Base) MCG/ACT inhaler INHALE 2 PUFFS 2-3 TIMES A DAY AS NEEDED FOR WHEEZING (30 DAYS) 30 DAY(S) 8.5 each 3   amoxicillin-clavulanate (AUGMENTIN) 875-125 MG tablet Take 1 tablet by mouth 2 (two) times daily. 14 tablet 0   apixaban (ELIQUIS) 5 MG TABS tablet Take 1 tablet (5 mg total) by mouth 2 (two) times daily. 180 tablet 3   Budeson-Glycopyrrol-Formoterol (BREZTRI AEROSPHERE) 160-9-4.8 MCG/ACT AERO Inhale 2 puffs into the lungs 2 (two) times daily. 10.7 g 6   clonazePAM (KLONOPIN) 0.5 MG tablet TAKE 1 TABLET BY MOUTH AT BEDTIME. 30 tablet 0   diltiazem (CARDIZEM CD) 120 MG 24 hr capsule Take 1 capsule (120 mg total) by mouth 2 (two) times daily. 180 capsule 3   Docusate Calcium (STOOL SOFTENER PO) Take 1 tablet by mouth every evening.     dofetilide (TIKOSYN) 250 MCG capsule Take 1 capsule (250 mcg total) by mouth 2 (two) times daily. 180 capsule 2   finasteride (PROSCAR) 5 MG tablet Take 5 mg by mouth daily.     furosemide (LASIX) 20 MG tablet Take 2 tablets (40 mg total) by mouth as directed. Take 2 tablets in the morning (40 mg) for 4 days. 180 tablet 3   gabapentin (NEURONTIN) 100 MG capsule Take 200 mg by mouth at bedtime.     ipratropium (ATROVENT) 0.03  % nasal spray Place 2 sprays into both nostrils every 12 (twelve) hours. 30 mL 12   ipratropium-albuterol (DUONEB) 0.5-2.5 (3) MG/3ML SOLN INHALE 3 ML BY NEBULIZER EVERY 6 HOURS AS NEEDED 360 mL 1   nitroGLYCERIN (NITROSTAT) 0.4 MG SL tablet Place 1 tablet (0.4 mg total) under the tongue every 5 (five) minutes as needed for chest pain. 25 tablet 4   OXYGEN Inhale into the lungs. 3-4 liters upon exertion and 3 liters continuous at night.     pantoprazole (PROTONIX) 40 MG tablet Take 1 tablet (40 mg total) by mouth daily. 90 tablet 3   polyethylene glycol (MIRALAX / GLYCOLAX) 17 g packet Take 17 g by mouth daily as needed for moderate constipation.     predniSONE (DELTASONE) 20 MG tablet Take 2 tablets (40 mg total) by mouth daily. For 3 days, then 1 tab daily for 3 days 9 tablet 1   predniSONE (DELTASONE) 5 MG tablet Take 2 tablets (10 mg total) by mouth daily with breakfast. (Patient taking differently: Take 7.5 mg by mouth daily with breakfast.) 60 tablet 11   rosuvastatin (CRESTOR) 20 MG tablet Take 1 tablet (20 mg total) by mouth daily. 90 tablet 3   silver sulfADIAZINE (SILVADENE) 1 % cream Apply 1 Application topically daily. 50 g 0   sodium chloride HYPERTONIC 3 % nebulizer solution Take by nebulization in  the morning and at bedtime. 750 mL 12   No current facility-administered medications on file prior to visit.    Allergies  Allergen Reactions   Flagyl [Metronidazole] Other (See Comments)    Other reaction(s): skin reaction    Past Medical History:  Diagnosis Date   Abdominal discomfort 12/07/2020   Acquired trigger finger of right middle finger 02/04/2020   Acute prostatitis 04/08/2018   Alcohol abuse    Alcohol dependence (HCC) 12/07/2020   Anal or rectal pain 04/08/2018   Annual physical exam 12/07/2020   Anorectal disorder 12/07/2020   Arthritis    Atherosclerotic heart disease of native coronary artery without angina pectoris 04/08/2018   a. 03/2018 PCI: LM nl, LAD 71m,  LCX nl, OM1 70p (2.75x8 Synergy DES), 65m (2.5x24 Synergy DES), RCA 25p, RPL1 50. EF 55-65%.   Atrial flutter (HCC)    a. 07/2022 in setting of hospitalization for resp illness/RSV. CHA2DS2VASc = 4-->eliquis; b. 08/2022 s/p DCCV (100J); c. 09/26/2022 Recurrent Aflutter.   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 02/2022 Carotid U/S: 1-39% bilat ICA stenoses.   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   Contracture of palmar fascia 12/07/2020   COPD (chronic obstructive pulmonary disease) (HCC)    Corn of toe 12/07/2020   Corns and callosities 04/09/2018   Coronary artery disease    2019 with stents   Coronary atherosclerosis 05/07/2018   Degenerative disc disease, 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   Degenerative disc disease, 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   Diarrhea 04/09/2018   Double vision with both eyes open 12/07/2020   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   Dysphagia 08/14/2018   Dyspnea    ED (erectile dysfunction)    ED (erectile dysfunction) of organic origin 12/07/2020   Elevated prostate specific antigen (PSA) 04/08/2018   Elevated PSA    Encounter for screening for other disorder 12/07/2020   Enlarged prostate without lower urinary tract symptoms (luts) 04/09/2018   Enthesopathy 12/07/2020   Essential (primary) hypertension 04/08/2018   Ex-smoker 04/09/2018   Exertional dyspnea 04/02/2018   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 07/2022 Echo: EF 65-70%, no rwma, mild LVH, nl RV fxn.   Hyperlipidemia    Hypertension    Hypoxemia 12/07/2020   Kidney cysts    Left knee pain 12/07/2020   Low back pain 04/08/2018   Male erectile dysfunction 04/09/2018   Mixed hyperlipidemia 04/08/2018   Nocturia more than twice per night 04/09/2018   Osteoarthritis of first carpometacarpal joint 04/08/2018   Osteoarthritis of knee 12/07/2020   Other long term (current) drug therapy 12/07/2020   Pain due to onychomycosis of toenail of left foot 07/20/2019   Pain in joint 04/09/2018   Pain in knee 04/09/2018   Pain in unspecified knee 12/07/2020   Pain of finger 04/09/2018   Palpitations    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HCC) 12/07/2020  Personal history of colonic polyps 12/07/2020   Pneumonia    Pneumothorax 04/12/2020   Polypharmacy 04/09/2018   Porokeratosis 01/13/2019   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HCC)    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   Pulmonary nodules 06/08/2016   Pure hypercholesterolemia 12/07/2020   Renal cyst 12/07/2020   Sleep disorder 04/08/2018   Status post bronchoscopy 04/12/2020   Tear of rotator cuff 04/09/2018   Tobacco user 12/07/2020   Uncomplicated alcohol dependence (HCC) 04/09/2018   Unspecified rotator cuff tear or rupture of unspecified shoulder, not specified as traumatic 12/07/2020   Unspecified tear of unspecified meniscus, current injury, unspecified knee, initial encounter 12/07/2020   Visual disturbance 12/07/2020   Vitamin D deficiency 04/08/2018    Past Surgical History:  Procedure Laterality Date   BRONCHIAL BIOPSY  10/13/2019   Procedure: BRONCHIAL BIOPSIES;  Surgeon: Leslye Peer, MD;  Location: [facility] ENDOSCOPY;  Service: Pulmonary;;   BRONCHIAL BIOPSY  04/12/2020   Procedure: BRONCHIAL BIOPSIES;  Surgeon: Leslye Peer, MD;  Location: [facility] ENDOSCOPY;  Service: Pulmonary;;    BRONCHIAL BRUSHINGS  10/13/2019   Procedure: BRONCHIAL BRUSHINGS;  Surgeon: Leslye Peer, MD;  Location: [facility] ENDOSCOPY;  Service: Pulmonary;;   BRONCHIAL BRUSHINGS  04/12/2020   Procedure: BRONCHIAL BRUSHINGS;  Surgeon: Leslye Peer, MD;  Location: [facility] ENDOSCOPY;  Service: Pulmonary;;   BRONCHIAL NEEDLE ASPIRATION BIOPSY  10/13/2019   Procedure: BRONCHIAL NEEDLE ASPIRATION BIOPSIES;  Surgeon: Leslye Peer, MD;  Location: [facility] ENDOSCOPY;  Service: Pulmonary;;   BRONCHIAL NEEDLE ASPIRATION BIOPSY  04/12/2020   Procedure: BRONCHIAL NEEDLE ASPIRATION BIOPSIES;  Surgeon: Leslye Peer, MD;  Location: [facility] ENDOSCOPY;  Service: Pulmonary;;   BRONCHIAL WASHINGS  10/13/2019   Procedure: BRONCHIAL WASHINGS;  Surgeon: Leslye Peer, MD;  Location: [facility] ENDOSCOPY;  Service: Pulmonary;;   BRONCHIAL WASHINGS  04/12/2020   Procedure: BRONCHIAL WASHINGS;  Surgeon: Leslye Peer, MD;  Location: [facility] ENDOSCOPY;  Service: Pulmonary;;   CARDIAC CATHETERIZATION  2002   50% RCA   CARDIOVERSION N/A 09/17/2022   Procedure: CARDIOVERSION;  Surgeon: Iran Ouch, MD;  Location: [facility] ORS;  Service: Cardiovascular;  Laterality: N/A;   CATARACT EXTRACTION, BILATERAL     CORONARY STENT INTERVENTION N/A 04/15/2018   Procedure: CORONARY STENT INTERVENTION;  Surgeon: Corky Crafts, MD;  Location: [facility] INVASIVE CV LAB;  Service: Cardiovascular;  Laterality: N/A;  om1   EYE SURGERY     KNEE ARTHROSCOPY     LEFT HEART CATH AND CORONARY ANGIOGRAPHY N/A 04/15/2018   Procedure: LEFT HEART CATH AND CORONARY ANGIOGRAPHY;  Surgeon: Corky Crafts, MD;  Location: [facility] INVASIVE CV LAB;  Service: Cardiovascular;  Laterality: N/A;   MULTIPLE TOOTH EXTRACTIONS     TONSILLECTOMY     VIDEO BRONCHOSCOPY  04/12/2020   VIDEO BRONCHOSCOPY WITH ENDOBRONCHIAL NAVIGATION N/A 07/26/2016   Procedure: VIDEO BRONCHOSCOPY WITH ENDOBRONCHIAL NAVIGATION;  Surgeon: Leslye Peer, MD;  Location: [facility] OR;  Service: Thoracic;  Laterality: N/A;    VIDEO BRONCHOSCOPY WITH ENDOBRONCHIAL NAVIGATION N/A 10/13/2019   Procedure: [facility] AND VIDEO BRONCHOSCOPY WITH ENDOBRONCHIAL NAVIGATION;  Surgeon: Leslye Peer, MD;  Location: [facility] ENDOSCOPY;  Service: Pulmonary;  Laterality: N/A;   VIDEO BRONCHOSCOPY WITH ENDOBRONCHIAL NAVIGATION N/A 04/12/2020   Procedure: VIDEO BRONCHOSCOPY WITH ENDOBRONCHIAL NAVIGATION;  Surgeon: Leslye Peer, MD;  Location: [facility] ENDOSCOPY;  Service: Pulmonary;  Laterality: N/A;    Family History  Problem Relation Age of Onset   Hypertension Mother    Alzheimer's disease Mother  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  Tobacco Use   Smoking status: Former    Current packs/day: 0.00    Average packs/day: 0.8 packs/day for 68.0 years (51.0 ttl pk-yrs)    Types: Cigarettes    Start date: 83    Quit date: 07/13/2020    Years since quitting: 3.1    Passive exposure: Never   Smokeless tobacco: Never   Tobacco comments:    Recent Quit    Vaping Use   Vaping status: Former  Substance and Sexual Activity   Alcohol use: No    Alcohol/week: 0.0 standard drinks of alcohol    Comment: quit drinking 2014   Drug use: No   Sexual activity: Not on file  Other Topics Concern   Not on file  Social History Narrative   Has living will   Wife is health care POA--alternate is son [name] [name])   Has daughter in [place]   Has DNR   No tube feeds if cognitively unaware   Social Drivers of Health   Financial Resource Strain: Low Risk  (01/06/2023)   Overall Financial Resource Strain (CARDIA)    Difficulty of Paying Living Expenses: Not hard at all  Food Insecurity: No Food Insecurity (06/01/2023)   Hunger Vital Sign    Worried  About Running Out of Food in the Last Year: Never true    Ran Out of Food in the Last Year: Never true  Transportation Needs: No Transportation Needs (06/01/2023)   PRAPARE - Administrator, Civil Service (Medical): No    Lack of Transportation (Non-Medical): No  Physical Activity: Sufficiently Active (01/06/2023)   Exercise Vital Sign    Days of Exercise per Week: 4 days    Minutes of Exercise per Session: 40 min  Stress: No Stress Concern Present (01/06/2023)   [employer] of Occupational Health - Occupational Stress Questionnaire    Feeling of Stress : Not at all  Social Connections: Unknown (01/06/2023)   Social Connection and Isolation Panel [NHANES]    Frequency of Communication with Friends and Family: Never    Frequency of Social Gatherings with Friends and Family: Once a week    Attends Religious Services: More than 4 times per year    Active Member of [employer] or Organizations: Not on file    Attends Banker Meetings: Not on file    Marital Status: Married  Intimate Partner Violence: Not At Risk (06/01/2023)   Humiliation, Afraid, Rape, and Kick questionnaire    Fear of Current or Ex-Partner: No    Emotionally Abused: No    Physically Abused: No    Sexually Abused: No   Review of Systems     Objective:   Physical Exam Constitutional:      Appearance: Normal appearance.  HENT:     Mouth/Throat:     Comments: Clear hemorrhage along nasal side in right conjunctiva No inflammation Neurological:     Mental Status: He is alert.            Assessment & Plan:

## 2023-08-21 NOTE — Telephone Encounter (Signed)
Spoke to pt's wife. She likes the idea of changing to virtual, again. If his eye is better, she will cancel the appointment.

## 2023-08-21 NOTE — Assessment & Plan Note (Signed)
Reassured that this is a self limited problem and was related to forceful sneezing No action is needed

## 2023-08-22 ENCOUNTER — Telehealth: Payer: Self-pay | Admitting: Student in an Organized Health Care Education/Training Program

## 2023-08-22 ENCOUNTER — Telehealth: Payer: Self-pay

## 2023-08-22 DIAGNOSIS — J398 Other specified diseases of upper respiratory tract: Secondary | ICD-10-CM

## 2023-08-22 DIAGNOSIS — J441 Chronic obstructive pulmonary disease with (acute) exacerbation: Secondary | ICD-10-CM

## 2023-08-22 MED ORDER — CEFPODOXIME PROXETIL 200 MG PO TABS: 200.0000 mg | ORAL_TABLET | Freq: Two times a day (BID) | ORAL | 0 refills | Status: DC

## 2023-08-22 NOTE — Telephone Encounter (Signed)
Per patient/mychart message he is negative for COVID.

## 2023-08-22 NOTE — Telephone Encounter (Signed)
How long have your symptoms been going on for? Last Thursday or Friday Any fevers, chills or sweats? No Any cough? If so are you getting anything up? What color? Yes. Clear Any SOB? Yes Any wheezing? Yes Do you monitor your oxygen at home? 98 Do you wear O2? 3L  What medications do you take? Mucinex, OTC cough med no improvement with medication. Patient was started on Augmentin and prednisone on 08/20/23 by PCP. Patient reports he has been using Breztri daily. Ventolin inhaler 3-4 times daily since last Thursday. He reports using Duoneb also twice daily.  Covid test? No. Patient will do home test today and call back with result.

## 2023-08-22 NOTE — Telephone Encounter (Signed)
 Patient advised as below. Nothing further needed.

## 2023-08-22 NOTE — Telephone Encounter (Signed)
Please call PT wife to advise where rads and Labs are supposed to be done. Her # is [phone] Appt is on Monday.

## 2023-08-23 ENCOUNTER — Emergency Department: Payer: Medicare Other

## 2023-08-23 ENCOUNTER — Other Ambulatory Visit: Payer: Self-pay

## 2023-08-23 ENCOUNTER — Inpatient Hospital Stay
Admission: EM | Admit: 2023-08-23 | Discharge: 2023-09-01 | DRG: 189 | Disposition: A | Discharge status: Skilled Nursing Facility | Payer: Medicare Other | Attending: Internal Medicine | Admitting: Internal Medicine

## 2023-08-23 DIAGNOSIS — R918 Other nonspecific abnormal finding of lung field: Secondary | ICD-10-CM | POA: Diagnosis not present

## 2023-08-23 DIAGNOSIS — R531 Weakness: Secondary | ICD-10-CM | POA: Diagnosis not present

## 2023-08-23 DIAGNOSIS — R001 Bradycardia, unspecified: Secondary | ICD-10-CM | POA: Diagnosis not present

## 2023-08-23 DIAGNOSIS — K5900 Constipation, unspecified: Secondary | ICD-10-CM | POA: Diagnosis not present

## 2023-08-23 DIAGNOSIS — Z7901 Long term (current) use of anticoagulants: Secondary | ICD-10-CM

## 2023-08-23 DIAGNOSIS — I739 Peripheral vascular disease, unspecified: Secondary | ICD-10-CM | POA: Diagnosis present

## 2023-08-23 DIAGNOSIS — M1990 Unspecified osteoarthritis, unspecified site: Secondary | ICD-10-CM | POA: Diagnosis present

## 2023-08-23 DIAGNOSIS — J9622 Acute and chronic respiratory failure with hypercapnia: Secondary | ICD-10-CM | POA: Diagnosis present

## 2023-08-23 DIAGNOSIS — Z79899 Other long term (current) drug therapy: Secondary | ICD-10-CM

## 2023-08-23 DIAGNOSIS — Z9981 Dependence on supplemental oxygen: Secondary | ICD-10-CM

## 2023-08-23 DIAGNOSIS — J449 Chronic obstructive pulmonary disease, unspecified: Secondary | ICD-10-CM | POA: Diagnosis present

## 2023-08-23 DIAGNOSIS — I480 Paroxysmal atrial fibrillation: Secondary | ICD-10-CM | POA: Diagnosis present

## 2023-08-23 DIAGNOSIS — J439 Emphysema, unspecified: Secondary | ICD-10-CM | POA: Diagnosis present

## 2023-08-23 DIAGNOSIS — Z881 Allergy status to other antibiotic agents status: Secondary | ICD-10-CM

## 2023-08-23 DIAGNOSIS — L89312 Pressure ulcer of right buttock, stage 2: Secondary | ICD-10-CM | POA: Diagnosis not present

## 2023-08-23 DIAGNOSIS — M5030 Other cervical disc degeneration, unspecified cervical region: Secondary | ICD-10-CM | POA: Diagnosis present

## 2023-08-23 DIAGNOSIS — J9600 Acute respiratory failure, unspecified whether with hypoxia or hypercapnia: Secondary | ICD-10-CM | POA: Diagnosis not present

## 2023-08-23 DIAGNOSIS — J9601 Acute respiratory failure with hypoxia: Secondary | ICD-10-CM | POA: Diagnosis not present

## 2023-08-23 DIAGNOSIS — M51369 Other intervertebral disc degeneration, lumbar region without mention of lumbar back pain or lower extremity pain: Secondary | ICD-10-CM | POA: Diagnosis present

## 2023-08-23 DIAGNOSIS — E782 Mixed hyperlipidemia: Secondary | ICD-10-CM | POA: Diagnosis not present

## 2023-08-23 DIAGNOSIS — Z741 Need for assistance with personal care: Secondary | ICD-10-CM | POA: Diagnosis not present

## 2023-08-23 DIAGNOSIS — Z85828 Personal history of other malignant neoplasm of skin: Secondary | ICD-10-CM

## 2023-08-23 DIAGNOSIS — I5033 Acute on chronic diastolic (congestive) heart failure: Secondary | ICD-10-CM | POA: Diagnosis present

## 2023-08-23 DIAGNOSIS — J441 Chronic obstructive pulmonary disease with (acute) exacerbation: Principal | ICD-10-CM | POA: Diagnosis present

## 2023-08-23 DIAGNOSIS — J101 Influenza due to other identified influenza virus with other respiratory manifestations: Secondary | ICD-10-CM | POA: Diagnosis not present

## 2023-08-23 DIAGNOSIS — Z8249 Family history of ischemic heart disease and other diseases of the circulatory system: Secondary | ICD-10-CM

## 2023-08-23 DIAGNOSIS — I2510 Atherosclerotic heart disease of native coronary artery without angina pectoris: Secondary | ICD-10-CM | POA: Diagnosis not present

## 2023-08-23 DIAGNOSIS — Z515 Encounter for palliative care: Secondary | ICD-10-CM

## 2023-08-23 DIAGNOSIS — Z7951 Long term (current) use of inhaled steroids: Secondary | ICD-10-CM

## 2023-08-23 DIAGNOSIS — I110 Hypertensive heart disease with heart failure: Secondary | ICD-10-CM | POA: Diagnosis present

## 2023-08-23 DIAGNOSIS — Z820 Family history of epilepsy and other diseases of the nervous system: Secondary | ICD-10-CM

## 2023-08-23 DIAGNOSIS — Z87891 Personal history of nicotine dependence: Secondary | ICD-10-CM

## 2023-08-23 DIAGNOSIS — N400 Enlarged prostate without lower urinary tract symptoms: Secondary | ICD-10-CM | POA: Diagnosis present

## 2023-08-23 DIAGNOSIS — J9621 Acute and chronic respiratory failure with hypoxia: Principal | ICD-10-CM | POA: Diagnosis present

## 2023-08-23 DIAGNOSIS — L8990 Pressure ulcer of unspecified site, unspecified stage: Secondary | ICD-10-CM | POA: Insufficient documentation

## 2023-08-23 DIAGNOSIS — I509 Heart failure, unspecified: Secondary | ICD-10-CM | POA: Diagnosis not present

## 2023-08-23 DIAGNOSIS — E874 Mixed disorder of acid-base balance: Secondary | ICD-10-CM | POA: Diagnosis present

## 2023-08-23 DIAGNOSIS — L89322 Pressure ulcer of left buttock, stage 2: Secondary | ICD-10-CM | POA: Diagnosis not present

## 2023-08-23 DIAGNOSIS — Z8546 Personal history of malignant neoplasm of prostate: Secondary | ICD-10-CM

## 2023-08-23 DIAGNOSIS — I10 Essential (primary) hypertension: Secondary | ICD-10-CM | POA: Diagnosis not present

## 2023-08-23 DIAGNOSIS — J432 Centrilobular emphysema: Secondary | ICD-10-CM | POA: Diagnosis not present

## 2023-08-23 DIAGNOSIS — I130 Hypertensive heart and chronic kidney disease with heart failure and stage 1 through stage 4 chronic kidney disease, or unspecified chronic kidney disease: Secondary | ICD-10-CM | POA: Diagnosis not present

## 2023-08-23 DIAGNOSIS — Z9861 Coronary angioplasty status: Secondary | ICD-10-CM | POA: Diagnosis not present

## 2023-08-23 DIAGNOSIS — N183 Chronic kidney disease, stage 3 unspecified: Secondary | ICD-10-CM | POA: Diagnosis not present

## 2023-08-23 DIAGNOSIS — Z860100 Personal history of colon polyps, unspecified: Secondary | ICD-10-CM

## 2023-08-23 DIAGNOSIS — Z751 Person awaiting admission to adequate facility elsewhere: Secondary | ICD-10-CM

## 2023-08-23 DIAGNOSIS — M6284 Sarcopenia: Secondary | ICD-10-CM | POA: Diagnosis present

## 2023-08-23 DIAGNOSIS — Z66 Do not resuscitate: Secondary | ICD-10-CM | POA: Diagnosis not present

## 2023-08-23 DIAGNOSIS — Z743 Need for continuous supervision: Secondary | ICD-10-CM | POA: Diagnosis not present

## 2023-08-23 DIAGNOSIS — Z7952 Long term (current) use of systemic steroids: Secondary | ICD-10-CM

## 2023-08-23 DIAGNOSIS — J9602 Acute respiratory failure with hypercapnia: Secondary | ICD-10-CM | POA: Diagnosis not present

## 2023-08-23 DIAGNOSIS — R0602 Shortness of breath: Secondary | ICD-10-CM | POA: Diagnosis not present

## 2023-08-23 DIAGNOSIS — Z955 Presence of coronary angioplasty implant and graft: Secondary | ICD-10-CM

## 2023-08-23 DIAGNOSIS — M6281 Muscle weakness (generalized): Secondary | ICD-10-CM | POA: Diagnosis not present

## 2023-08-23 DIAGNOSIS — R0689 Other abnormalities of breathing: Secondary | ICD-10-CM | POA: Diagnosis not present

## 2023-08-23 DIAGNOSIS — R0603 Acute respiratory distress: Secondary | ICD-10-CM | POA: Diagnosis not present

## 2023-08-23 LAB — PROCALCITONIN: Procalcitonin: <0.1 ng/mL

## 2023-08-23 LAB — BRAIN NATRIURETIC PEPTIDE: B Natriuretic Peptide: 74.5 pg/mL (ref 0.0–100.0)

## 2023-08-23 LAB — CBC WITH DIFFERENTIAL/PLATELET
Abs Immature Granulocytes: 0.16 10*3/uL — ABNORMAL HIGH (ref 0.00–0.07)
Basophils Absolute: 0 10*3/uL (ref 0.0–0.1)
Basophils Relative: 0 %
Eosinophils Absolute: 0 10*3/uL (ref 0.0–0.5)
Eosinophils Relative: 0 %
HCT: 43 % (ref 39.0–52.0)
Hemoglobin: 13.6 g/dL (ref 13.0–17.0)
Immature Granulocytes: 1 %
Lymphocytes Relative: 5 %
Lymphs Abs: 0.7 10*3/uL (ref 0.7–4.0)
MCH: 30.6 pg (ref 26.0–34.0)
MCHC: 31.6 g/dL (ref 30.0–36.0)
MCV: 96.6 fL (ref 80.0–100.0)
Monocytes Absolute: 0.4 10*3/uL (ref 0.1–1.0)
Monocytes Relative: 3 %
Neutro Abs: 12.2 10*3/uL — ABNORMAL HIGH (ref 1.7–7.7)
Neutrophils Relative %: 91 %
Platelets: 304 10*3/uL (ref 150–400)
RBC: 4.45 MIL/uL (ref 4.22–5.81)
RDW: 13.3 % (ref 11.5–15.5)
WBC: 13.5 10*3/uL — ABNORMAL HIGH (ref 4.0–10.5)
nRBC: 0 % (ref 0.0–0.2)

## 2023-08-23 LAB — BLOOD GAS, VENOUS

## 2023-08-23 LAB — RESP PANEL BY RT-PCR (RSV, FLU A&B, COVID)  RVPGX2
Influenza A by PCR: NEGATIVE
Influenza B by PCR: NEGATIVE
Resp Syncytial Virus by PCR: NEGATIVE
SARS Coronavirus 2 by RT PCR: NEGATIVE

## 2023-08-23 LAB — COMPREHENSIVE METABOLIC PANEL
ALT: 18 U/L (ref 0–44)
AST: 25 U/L (ref 15–41)
Albumin: 3.6 g/dL (ref 3.5–5.0)
Alkaline Phosphatase: 61 U/L (ref 38–126)
Anion gap: 11 (ref 5–15)
BUN: 23 mg/dL (ref 8–23)
CO2: 36 mmol/L — ABNORMAL HIGH (ref 22–32)
Calcium: 9.1 mg/dL (ref 8.9–10.3)
Chloride: 95 mmol/L — ABNORMAL LOW (ref 98–111)
Creatinine, Ser: 0.93 mg/dL (ref 0.61–1.24)
GFR, Estimated: >60 mL/min (ref >60)
Glucose, Bld: 147 mg/dL — ABNORMAL HIGH (ref 70–99)
Potassium: 4 mmol/L (ref 3.5–5.1)
Sodium: 142 mmol/L (ref 135–145)
Total Bilirubin: 0.4 mg/dL (ref 0.0–1.2)
Total Protein: 6.9 g/dL (ref 6.5–8.1)

## 2023-08-23 LAB — LACTIC ACID, PLASMA
Lactic Acid, Venous: 1.2 mmol/L (ref 0.5–1.9)
Lactic Acid, Venous: 1.3 mmol/L (ref 0.5–1.9)

## 2023-08-23 LAB — TROPONIN I (HIGH SENSITIVITY)
Troponin I (High Sensitivity): 17 ng/L (ref <18)
Troponin I (High Sensitivity): 18 ng/L — ABNORMAL HIGH (ref <18)

## 2023-08-23 MED ORDER — DOFETILIDE 250 MCG PO CAPS
250.0000 ug | ORAL_CAPSULE | Freq: Two times a day (BID) | ORAL | Status: DC
  Administered 2023-08-23 – 2023-09-01 (×18): 250 ug via ORAL
  Filled 2023-08-23 (×21): qty 1

## 2023-08-23 MED ORDER — SODIUM CHLORIDE 0.9 % IV SOLN
1.0000 g | Freq: Once | INTRAVENOUS | Status: AC
  Administered 2023-08-23: 1 g via INTRAVENOUS
  Filled 2023-08-23: qty 10

## 2023-08-23 MED ORDER — ALBUTEROL SULFATE HFA 108 (90 BASE) MCG/ACT IN AERS: 2.0000 | INHALATION_SPRAY | Freq: Four times a day (QID) | RESPIRATORY_TRACT | Status: DC | PRN

## 2023-08-23 MED ORDER — DOXYCYCLINE HYCLATE 100 MG PO TABS
100.0000 mg | ORAL_TABLET | Freq: Two times a day (BID) | ORAL | Status: DC
  Administered 2023-08-23 – 2023-08-25 (×4): 100 mg via ORAL
  Filled 2023-08-23 (×4): qty 1

## 2023-08-23 MED ORDER — ROSUVASTATIN CALCIUM 10 MG PO TABS
20.0000 mg | ORAL_TABLET | Freq: Every day | ORAL | Status: DC
  Administered 2023-08-24 – 2023-09-01 (×9): 20 mg via ORAL
  Filled 2023-08-23 (×3): qty 2
  Filled 2023-08-23: qty 1
  Filled 2023-08-23 (×4): qty 2
  Filled 2023-08-23: qty 1

## 2023-08-23 MED ORDER — SALINE SPRAY 0.65 % NA SOLN
1.0000 | Freq: Once | NASAL | Status: AC
  Administered 2023-08-23: 1 via NASAL
  Filled 2023-08-23: qty 44

## 2023-08-23 MED ORDER — ACETAMINOPHEN 650 MG RE SUPP: 650.0000 mg | Freq: Four times a day (QID) | RECTAL | Status: DC | PRN

## 2023-08-23 MED ORDER — ACETAMINOPHEN 325 MG PO TABS: 650.0000 mg | ORAL_TABLET | Freq: Four times a day (QID) | ORAL | Status: DC | PRN

## 2023-08-23 MED ORDER — FLUTICASONE FUROATE-VILANTEROL 200-25 MCG/ACT IN AEPB
1.0000 | INHALATION_SPRAY | Freq: Every day | RESPIRATORY_TRACT | Status: DC
  Administered 2023-08-24 – 2023-08-26 (×3): 1 via RESPIRATORY_TRACT
  Filled 2023-08-23: qty 28

## 2023-08-23 MED ORDER — SODIUM CHLORIDE 3 % IN NEBU
4.0000 mL | INHALATION_SOLUTION | Freq: Two times a day (BID) | RESPIRATORY_TRACT | Status: AC
  Administered 2023-08-23 – 2023-08-26 (×5): 4 mL via RESPIRATORY_TRACT
  Filled 2023-08-23 (×7): qty 4

## 2023-08-23 MED ORDER — FUROSEMIDE 10 MG/ML IJ SOLN
40.0000 mg | Freq: Once | INTRAMUSCULAR | Status: AC
  Administered 2023-08-24: 40 mg via INTRAVENOUS
  Filled 2023-08-23: qty 4

## 2023-08-23 MED ORDER — APIXABAN 5 MG PO TABS: 5.0000 mg | ORAL_TABLET | Freq: Two times a day (BID) | ORAL | Status: DC

## 2023-08-23 MED ORDER — ACETAZOLAMIDE 250 MG PO TABS
250.0000 mg | ORAL_TABLET | Freq: Two times a day (BID) | ORAL | Status: AC
  Administered 2023-08-23: 250 mg via ORAL
  Filled 2023-08-23 (×2): qty 1

## 2023-08-23 MED ORDER — FINASTERIDE 5 MG PO TABS
5.0000 mg | ORAL_TABLET | Freq: Every day | ORAL | Status: DC
  Administered 2023-08-24 – 2023-09-01 (×9): 5 mg via ORAL
  Filled 2023-08-23 (×9): qty 1

## 2023-08-23 MED ORDER — GABAPENTIN 100 MG PO CAPS
200.0000 mg | ORAL_CAPSULE | Freq: Every day | ORAL | Status: DC
Start: 2023-08-23 — End: 2023-09-01
  Administered 2023-08-23 – 2023-08-31 (×9): 200 mg via ORAL
  Filled 2023-08-23 (×9): qty 2

## 2023-08-23 MED ORDER — FLUTICASONE PROPIONATE 50 MCG/ACT NA SUSP
2.0000 | Freq: Every day | NASAL | Status: DC
  Administered 2023-08-24 – 2023-09-01 (×8): 2 via NASAL
  Filled 2023-08-23: qty 16

## 2023-08-23 MED ORDER — IPRATROPIUM-ALBUTEROL 0.5-2.5 (3) MG/3ML IN SOLN
3.0000 mL | RESPIRATORY_TRACT | Status: DC
  Administered 2023-08-23 – 2023-08-25 (×7): 3 mL via RESPIRATORY_TRACT
  Filled 2023-08-23 (×7): qty 3

## 2023-08-23 MED ORDER — PANTOPRAZOLE SODIUM 40 MG PO TBEC
40.0000 mg | DELAYED_RELEASE_TABLET | Freq: Every day | ORAL | Status: DC
  Administered 2023-08-24 – 2023-09-01 (×9): 40 mg via ORAL
  Filled 2023-08-23 (×10): qty 1

## 2023-08-23 MED ORDER — DOXYCYCLINE HYCLATE 100 MG PO TABS
100.0000 mg | ORAL_TABLET | Freq: Once | ORAL | Status: AC
  Administered 2023-08-23: 100 mg via ORAL
  Filled 2023-08-23: qty 1

## 2023-08-23 MED ORDER — METHYLPREDNISOLONE SODIUM SUCC 125 MG IJ SOLR
80.0000 mg | Freq: Two times a day (BID) | INTRAMUSCULAR | Status: DC
  Administered 2023-08-23 – 2023-08-25 (×5): 80 mg via INTRAVENOUS
  Filled 2023-08-23 (×5): qty 2

## 2023-08-23 MED ORDER — DILTIAZEM HCL ER COATED BEADS 120 MG PO CP24
120.0000 mg | ORAL_CAPSULE | Freq: Two times a day (BID) | ORAL | Status: DC
  Administered 2023-08-23 – 2023-09-01 (×18): 120 mg via ORAL
  Filled 2023-08-23 (×18): qty 1

## 2023-08-23 MED ORDER — ONDANSETRON HCL 4 MG PO TABS: 4.0000 mg | ORAL_TABLET | Freq: Four times a day (QID) | ORAL | Status: DC | PRN

## 2023-08-23 MED ORDER — UMECLIDINIUM BROMIDE 62.5 MCG/ACT IN AEPB
1.0000 | INHALATION_SPRAY | Freq: Every day | RESPIRATORY_TRACT | Status: DC
  Administered 2023-08-24 – 2023-08-26 (×3): 1 via RESPIRATORY_TRACT
  Filled 2023-08-23: qty 7

## 2023-08-23 MED ORDER — BUDESON-GLYCOPYRROL-FORMOTEROL 160-9-4.8 MCG/ACT IN AERO: 2.0000 | INHALATION_SPRAY | Freq: Two times a day (BID) | RESPIRATORY_TRACT | Status: DC

## 2023-08-23 MED ORDER — APIXABAN 5 MG PO TABS
5.0000 mg | ORAL_TABLET | Freq: Two times a day (BID) | ORAL | Status: DC
  Administered 2023-08-23 – 2023-09-01 (×18): 5 mg via ORAL
  Filled 2023-08-23 (×18): qty 1

## 2023-08-23 MED ORDER — ALBUTEROL SULFATE (2.5 MG/3ML) 0.083% IN NEBU
2.5000 mg | INHALATION_SOLUTION | Freq: Four times a day (QID) | RESPIRATORY_TRACT | Status: DC | PRN
  Administered 2023-08-24: 2.5 mg via RESPIRATORY_TRACT
  Filled 2023-08-23: qty 3

## 2023-08-23 MED ORDER — CLONAZEPAM 0.5 MG PO TABS
0.5000 mg | ORAL_TABLET | Freq: Every day | ORAL | Status: DC
Start: 2023-08-23 — End: 2023-09-01
  Administered 2023-08-23 – 2023-08-31 (×9): 0.5 mg via ORAL
  Filled 2023-08-23 (×9): qty 1

## 2023-08-23 MED ORDER — FUROSEMIDE 10 MG/ML IJ SOLN
40.0000 mg | Freq: Once | INTRAMUSCULAR | Status: AC
  Administered 2023-08-23: 40 mg via INTRAVENOUS
  Filled 2023-08-23: qty 4

## 2023-08-23 MED ORDER — ONDANSETRON HCL 4 MG/2ML IJ SOLN: 4.0000 mg | Freq: Four times a day (QID) | INTRAMUSCULAR | Status: DC | PRN

## 2023-08-23 NOTE — H&P (Signed)
History and Physical    [name] [name]. ZOX:[id number] DOB: [birth year] DOA: 08/23/2023  PCP: [name], MD (Confirm with patient/family/NH records and if not entered, this has to be entered at [facility] point of entry) Patient coming from: Home  I have personally briefly reviewed patient's old medical records in [facility]  Chief Complaint: Cough, wheezing, SOB  HPI: [name] [name]. is a 83 y.o. male with medical history significant of steroid-dependent COPD Gold stage III, chronic hypoxic respiratory failure on 3 to 4 L as needed, HTN, chronic HFpEF, CAD status post stenting in 2019, PAF on Tikosyn and Eliquis presented with worsening of cough wheezing shortness of breath.  Symptoms started 3 to 4 days ago, patient started develop productive cough with clear phlegm and wheezing and increasing shortness of breath denied any orthopnea no chest pains no fever or chills.  Gradually getting worse and PCP started patient on p.o. steroid 2 days ago and Keflex yesterday.  However, no significant improvement overall and had to turn up oxygen to 5 L through the night and eventually decided come to ED. ED Course: Afebrile, nontachycardic blood pressure 120/70, O2 saturation 100% on BiPAP.  Blood work showed bicarb 36, creatinine 0.9, VBG 7.3 6/80/45, WBC 13.5.  Checks x-ray showed no significant acute infiltrates.  Patient was started on BiPAP in the ED and 1 dose of doxycycline and 1 dose of ceftriaxone given.  Review of Systems: As per HPI otherwise 14 point review of systems negative.    Past Medical History:  Diagnosis Date   Abdominal discomfort 12/07/2020   Acquired trigger finger of right middle finger 02/04/2020   Acute prostatitis 04/08/2018   Alcohol abuse    Alcohol dependence (HCC) 12/07/2020   Anal or rectal pain 04/08/2018   Annual physical exam 12/07/2020   Anorectal disorder 12/07/2020   Arthritis    Atherosclerotic heart disease of native coronary artery  without angina pectoris 04/08/2018   a. 03/2018 PCI: LM nl, LAD 68m, LCX nl, OM1 70p (2.75x8 Synergy DES), 18m (2.5x24 Synergy DES), RCA 25p, RPL1 50. EF 55-65%.   Atrial flutter (HCC)    a. 07/2022 in setting of hospitalization for resp illness/RSV. CHA2DS2VASc = 4-->eliquis; b. 08/2022 s/p DCCV (100J); c. 09/26/2022 Recurrent Aflutter.   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 02/2022 Carotid U/S: 1-39% bilat ICA stenoses.   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   Contracture of palmar fascia 12/07/2020   COPD (chronic obstructive pulmonary disease) (HCC)    Corn of toe 12/07/2020   Corns and callosities 04/09/2018   Coronary artery disease    2019 with stents   Coronary atherosclerosis 05/07/2018   Degenerative disc disease, 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   Degenerative disc disease, 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   Diarrhea 04/09/2018   Double vision with both eyes open 12/07/2020   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   Dysphagia 08/14/2018   Dyspnea    ED (erectile dysfunction)    ED (erectile dysfunction) of organic origin 12/07/2020   Elevated prostate specific antigen (PSA) 04/08/2018  Elevated PSA    Encounter for screening for other disorder 12/07/2020   Enlarged prostate without lower urinary tract symptoms (luts) 04/09/2018   Enthesopathy 12/07/2020   Essential (primary) hypertension 04/08/2018   Ex-smoker 04/09/2018   Exertional dyspnea 04/02/2018   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   Flatulence 04/08/2018    Gastro-esophageal reflux disease without esophagitis 04/08/2018   GERD (gastroesophageal reflux disease)    History of acute otitis externa 08/14/2018   History of echocardiogram    a. 07/2022 Echo: EF 65-70%, no rwma, mild LVH, nl RV fxn.   Hyperlipidemia    Hypertension    Hypoxemia 12/07/2020   Kidney cysts    Left knee pain 12/07/2020   Low back pain 04/08/2018   Male erectile dysfunction 04/09/2018   Mixed hyperlipidemia 04/08/2018   Nocturia more than twice per night 04/09/2018   Osteoarthritis of first carpometacarpal joint 04/08/2018   Osteoarthritis of knee 12/07/2020   Other long term (current) drug therapy 12/07/2020   Pain due to onychomycosis of toenail of left foot 07/20/2019   Pain in joint 04/09/2018   Pain in knee 04/09/2018   Pain in unspecified knee 12/07/2020   Pain of finger 04/09/2018   Palpitations    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HCC) 12/07/2020   Personal history of colonic polyps 12/07/2020   Pneumonia    Pneumothorax 04/12/2020   Polypharmacy 04/09/2018   Porokeratosis 01/13/2019   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HCC)    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   Pulmonary nodules 06/08/2016   Pure hypercholesterolemia 12/07/2020   Renal cyst 12/07/2020   Sleep disorder 04/08/2018   Status post bronchoscopy 04/12/2020   Tear of rotator cuff 04/09/2018   Tobacco user 12/07/2020   Uncomplicated alcohol dependence (HCC) 04/09/2018   Unspecified rotator cuff tear or rupture of unspecified shoulder, not specified as traumatic 12/07/2020   Unspecified tear of unspecified meniscus, current injury, unspecified knee, initial encounter 12/07/2020   Visual disturbance 12/07/2020   Vitamin D deficiency 04/08/2018    Past Surgical History:  Procedure Laterality Date   BRONCHIAL BIOPSY  10/13/2019   Procedure: BRONCHIAL BIOPSIES;  Surgeon: Leslye Peer, MD;  Location: [facility] ENDOSCOPY;  Service: Pulmonary;;   BRONCHIAL BIOPSY  04/12/2020   Procedure: BRONCHIAL BIOPSIES;   Surgeon: Leslye Peer, MD;  Location: [facility] ENDOSCOPY;  Service: Pulmonary;;   BRONCHIAL BRUSHINGS  10/13/2019   Procedure: BRONCHIAL BRUSHINGS;  Surgeon: Leslye Peer, MD;  Location: [facility] ENDOSCOPY;  Service: Pulmonary;;   BRONCHIAL BRUSHINGS  04/12/2020   Procedure: BRONCHIAL BRUSHINGS;  Surgeon: Leslye Peer, MD;  Location: [facility] ENDOSCOPY;  Service: Pulmonary;;   BRONCHIAL NEEDLE ASPIRATION BIOPSY  10/13/2019   Procedure: BRONCHIAL NEEDLE ASPIRATION BIOPSIES;  Surgeon: Leslye Peer, MD;  Location: [facility] ENDOSCOPY;  Service: Pulmonary;;   BRONCHIAL NEEDLE ASPIRATION BIOPSY  04/12/2020   Procedure: BRONCHIAL NEEDLE ASPIRATION BIOPSIES;  Surgeon: Leslye Peer, MD;  Location: [facility] ENDOSCOPY;  Service: Pulmonary;;   BRONCHIAL WASHINGS  10/13/2019   Procedure: BRONCHIAL WASHINGS;  Surgeon: Leslye Peer, MD;  Location: [facility] ENDOSCOPY;  Service: Pulmonary;;   BRONCHIAL WASHINGS  04/12/2020   Procedure: BRONCHIAL WASHINGS;  Surgeon: Leslye Peer, MD;  Location: [facility] ENDOSCOPY;  Service: Pulmonary;;   CARDIAC CATHETERIZATION  2002   50% RCA   CARDIOVERSION N/A 09/17/2022   Procedure: CARDIOVERSION;  Surgeon: Iran Ouch, MD;  Location: [facility] ORS;  Service: Cardiovascular;  Laterality: N/A;   CATARACT EXTRACTION, BILATERAL     CORONARY STENT INTERVENTION N/A 04/15/2018  Procedure: CORONARY STENT INTERVENTION;  Surgeon: Corky Crafts, MD;  Location: [facility] INVASIVE CV LAB;  Service: Cardiovascular;  Laterality: N/A;  om1   EYE SURGERY     KNEE ARTHROSCOPY     LEFT HEART CATH AND CORONARY ANGIOGRAPHY N/A 04/15/2018   Procedure: LEFT HEART CATH AND CORONARY ANGIOGRAPHY;  Surgeon: Corky Crafts, MD;  Location: [facility] INVASIVE CV LAB;  Service: Cardiovascular;  Laterality: N/A;   MULTIPLE TOOTH EXTRACTIONS     TONSILLECTOMY     VIDEO BRONCHOSCOPY  04/12/2020   VIDEO BRONCHOSCOPY WITH ENDOBRONCHIAL NAVIGATION N/A 07/26/2016   Procedure: VIDEO BRONCHOSCOPY WITH ENDOBRONCHIAL NAVIGATION;   Surgeon: Leslye Peer, MD;  Location: [facility] OR;  Service: Thoracic;  Laterality: N/A;   VIDEO BRONCHOSCOPY WITH ENDOBRONCHIAL NAVIGATION N/A 10/13/2019   Procedure: [facility] AND VIDEO BRONCHOSCOPY WITH ENDOBRONCHIAL NAVIGATION;  Surgeon: Leslye Peer, MD;  Location: [facility] ENDOSCOPY;  Service: Pulmonary;  Laterality: N/A;   VIDEO BRONCHOSCOPY WITH ENDOBRONCHIAL NAVIGATION N/A 04/12/2020   Procedure: VIDEO BRONCHOSCOPY WITH ENDOBRONCHIAL NAVIGATION;  Surgeon: Leslye Peer, MD;  Location: [facility] ENDOSCOPY;  Service: Pulmonary;  Laterality: N/A;     reports that he quit smoking about 3 years ago. His smoking use included cigarettes. He started smoking about 71 years ago. He has a 51 pack-year smoking history. He has never been exposed to tobacco smoke. He has never used smokeless tobacco. He reports that he does not drink alcohol and does not use drugs.  Allergies  Allergen Reactions   Flagyl [Metronidazole] Other (See Comments)    Other reaction(s): skin reaction    Family History  Problem Relation Age of Onset   Hypertension Mother    Alzheimer's disease Mother      Prior to Admission medications   Medication Sig Start Date End Date Taking? Authorizing Provider  acetaminophen (TYLENOL) 325 MG tablet Take 650 mg by mouth every 4 (four) hours as needed.    [provider]  albuterol (VENTOLIN HFA) 108 (90 Base) MCG/ACT inhaler INHALE 2 PUFFS 2-3 TIMES A DAY AS NEEDED FOR WHEEZING (30 DAYS) 30 DAY(S) 07/01/23   [name], MD  apixaban (ELIQUIS) 5 MG TABS tablet Take 1 tablet (5 mg total) by mouth 2 (two) times daily. 08/01/23   [name], MD  Budeson-Glycopyrrol-Formoterol (BREZTRI AEROSPHERE) 160-9-4.8 MCG/ACT AERO Inhale 2 puffs into the lungs 2 (two) times daily. 08/01/23   [name], MD  cefpodoxime (VANTIN) 200 MG tablet Take 1 tablet (200 mg total) by mouth 2 (two) times daily for 7 days. 08/22/23 08/29/23  Raechel Chute, MD  clonazePAM (KLONOPIN) 0.5 MG tablet TAKE  1 TABLET BY MOUTH AT BEDTIME. 08/02/23   [name], MD  diltiazem (CARDIZEM CD) 120 MG 24 hr capsule Take 1 capsule (120 mg total) by mouth 2 (two) times daily. 08/30/22   Furth, Cadence H, PA-C  Docusate Calcium (STOOL SOFTENER PO) Take 1 tablet by mouth every evening.    [provider]  dofetilide (TIKOSYN) 250 MCG capsule Take 1 capsule (250 mcg total) by mouth 2 (two) times daily. 03/21/23   Sherie Don, NP  finasteride (PROSCAR) 5 MG tablet Take 5 mg by mouth daily.    [provider]  furosemide (LASIX) 20 MG tablet Take 2 tablets (40 mg total) by mouth as directed. Take 2 tablets in the morning (40 mg) for 4 days. 07/12/23   Sherie Don, NP  gabapentin (NEURONTIN) 100 MG capsule Take 200 mg by mouth at bedtime. 07/26/21  [provider]  ipratropium (ATROVENT) 0.03 % nasal spray Place 2 sprays into both nostrils every 12 (twelve) hours. 10/19/22   Leslye Peer, MD  ipratropium-albuterol (DUONEB) 0.5-2.5 (3) MG/3ML SOLN INHALE 3 ML BY NEBULIZER EVERY 6 HOURS AS NEEDED 11/29/22   Byrum, Les Pou, MD  nitroGLYCERIN (NITROSTAT) 0.4 MG SL tablet Place 1 tablet (0.4 mg total) under the tongue every 5 (five) minutes as needed for chest pain. 07/13/21   Revankar, Aundra Dubin, MD  OXYGEN Inhale into the lungs. 3-4 liters upon exertion and 3 liters continuous at night.    [provider]  pantoprazole (PROTONIX) 40 MG tablet Take 1 tablet (40 mg total) by mouth daily. 03/13/23   [name], MD  polyethylene glycol (MIRALAX / GLYCOLAX) 17 g packet Take 17 g by mouth daily as needed for moderate constipation.    [provider]  predniSONE (DELTASONE) 20 MG tablet Take 2 tablets (40 mg total) by mouth daily. For 3 days, then 1 tab daily for 3 days 08/20/23   [name], MD  predniSONE (DELTASONE) 5 MG tablet Take 2 tablets (10 mg total) by mouth daily with breakfast. Patient taking differently: Take 7.5 mg by mouth daily with breakfast.  06/26/23 06/25/24  Raechel Chute, MD  rosuvastatin (CRESTOR) 20 MG tablet Take 1 tablet (20 mg total) by mouth daily. 04/16/23   Sherie Don, NP  silver sulfADIAZINE (SILVADENE) 1 % cream Apply 1 Application topically daily. 09/20/22   Tillman Abide I, MD  sodium chloride HYPERTONIC 3 % nebulizer solution Take by nebulization in the morning and at bedtime. 06/26/23 06/25/24  Raechel Chute, MD    Physical Exam: Vitals:   08/23/23 1259 08/23/23 1306 08/23/23 1400  BP:  (!) 195/81 127/74  Pulse:  82 83  Resp:  (!) 24 (!) 23  Temp:  98.7 F (37.1 C)   TempSrc:  Oral   SpO2: 100% 100% 100%    Constitutional: NAD, calm, comfortable Vitals:   08/23/23 1259 08/23/23 1306 08/23/23 1400  BP:  (!) 195/81 127/74  Pulse:  82 83  Resp:  (!) 24 (!) 23  Temp:  98.7 F (37.1 C)   TempSrc:  Oral   SpO2: 100% 100% 100%   Eyes: PERRL, lids and conjunctivae normal ENMT: Mucous membranes are moist. Posterior pharynx clear of any exudate or lesions.Normal dentition.  Neck: normal, supple, no masses, no thyromegaly Respiratory: Diminished breathing sound bilaterally, scattered wheezing, no crackles.  Increasing respiratory effort. No accessory muscle use.  Cardiovascular: Regular rate and rhythm, no murmurs / rubs / gallops. 2+ extremity edema. 2+ pedal pulses. No carotid bruits.  Abdomen: no tenderness, no masses palpated. No hepatosplenomegaly. Bowel sounds positive.  Musculoskeletal: no clubbing / cyanosis. No joint deformity upper and lower extremities. Good ROM, no contractures. Normal muscle tone.  Skin: no rashes, lesions, ulcers. No induration Neurologic: CN 2-12 grossly intact. Sensation intact, DTR normal. Strength 5/5 in all 4.  Psychiatric: Normal judgment and insight. Alert and oriented x 3. Normal mood.     Labs on Admission: I have personally reviewed following labs and imaging studies  CBC: Recent Labs  Lab 08/23/23 1325  WBC 13.5*  NEUTROABS 12.2*  HGB 13.6  HCT 43.0   MCV 96.6  PLT 304   Basic Metabolic Panel: Recent Labs  Lab 08/23/23 1325  NA 142  K 4.0  CL 95*  CO2 36*  GLUCOSE 147*  BUN 23  CREATININE 0.93  CALCIUM 9.1  GFR: CrCl cannot be calculated (Unknown ideal weight.). Liver Function Tests: Recent Labs  Lab 08/23/23 1325  AST 25  ALT 18  ALKPHOS 61  BILITOT 0.4  PROT 6.9  ALBUMIN 3.6   No results for input(s): "LIPASE", "AMYLASE" in the last 168 hours. No results for input(s): "AMMONIA" in the last 168 hours. Coagulation Profile: No results for input(s): "INR", "PROTIME" in the last 168 hours. Cardiac Enzymes: No results for input(s): "CKTOTAL", "CKMB", "CKMBINDEX", "TROPONINI" in the last 168 hours. BNP (last 3 results) No results for input(s): "PROBNP" in the last 8760 hours. HbA1C: No results for input(s): "HGBA1C" in the last 72 hours. CBG: No results for input(s): "GLUCAP" in the last 168 hours. Lipid Profile: No results for input(s): "CHOL", "HDL", "LDLCALC", "TRIG", "CHOLHDL", "LDLDIRECT" in the last 72 hours. Thyroid Function Tests: No results for input(s): "TSH", "T4TOTAL", "FREET4", "T3FREE", "THYROIDAB" in the last 72 hours. Anemia Panel: No results for input(s): "VITAMINB12", "FOLATE", "FERRITIN", "TIBC", "IRON", "RETICCTPCT" in the last 72 hours. Urine analysis:    Component Value Date/Time   COLORURINE YELLOW 11/16/2009 1130   APPEARANCEUR CLEAR 11/16/2009 1130   LABSPEC 1.017 11/16/2009 1130   PHURINE 7.5 11/16/2009 1130   GLUCOSEU NEGATIVE 11/16/2009 1130   HGBUR NEGATIVE 11/16/2009 1130   BILIRUBINUR NEGATIVE 11/16/2009 1130   KETONESUR NEGATIVE 11/16/2009 1130   PROTEINUR NEGATIVE 11/16/2009 1130   UROBILINOGEN 0.2 11/16/2009 1130   NITRITE NEGATIVE 11/16/2009 1130   LEUKOCYTESUR  11/16/2009 1130    NEGATIVE MICROSCOPIC NOT DONE ON URINES WITH NEGATIVE PROTEIN, BLOOD, LEUKOCYTES, NITRITE, OR GLUCOSE <1000 mg/dL.    Radiological Exams on Admission: DG Chest Portable 1 View Result  Date: 08/23/2023 CLINICAL DATA:  Shortness of breath EXAM: PORTABLE CHEST 1 VIEW COMPARISON:  Chest radiograph dated 05/31/2023 FINDINGS: Hyperinflated lungs. Asymmetric lucency of the left upper lung. No focal consolidations. No pleural effusion or pneumothorax. The heart size and mediastinal contours are within normal limits. No acute osseous abnormality. IMPRESSION: 1. Hyperinflated lungs with asymmetric lucency of the left upper lung, in keeping with emphysematous changes. 2. No focal consolidations. Electronically Signed   By: Agustin Cree M.D.   On: 08/23/2023 14:30    EKG: Independently reviewed.  Sinus arrhythmia, no acute ST changes.  Assessment/Plan Principal Problem:   COPD (chronic obstructive pulmonary disease) (HCC) Active Problems:   Acute on chronic respiratory failure with hypoxia (HCC)   COPD with acute exacerbation (HCC)  (please populate well all problems here in Problem List. (For example, if patient is on BP meds at home and you resume or decide to hold them, it is a problem that needs to be her. Same for CAD, COPD, HLD and so on)  Acute on chronic hypoxic and hypercapnic respiratory failure Acute COPD exacerbation Compensated chronic combined respiratory acidosis and metabolic alkalosis -On low setting of BiPAP, saturation and breathing effort improving, expect patient, BiPAP in the next few hours.  Patient is full code -Doxycycline -IV Solu-Medrol -ICS and LABA -DuoNebs and as needed albuterol -Diamox x 2 -Incentive spirometry and flutter valve  Acute on chronic HFpEF decompensation -Plan to continue IV diuresis, at least for today and tomorrow, reevaluate volume status tomorrow to decide further diuresis plan. -Will not repeat echocardiogram as one was done 3 months ago.  PAF -EKG shows sinus arrhythmia. -Continue Tikosyn and Cardizem, recommend patient follow-up with cardiology discussed regarding change Cardizem to other rate control medications to help  peripheral edema -Continue Eliquis  CAD -Denies any chest pains, EKG showed no  acute ST changes.  Troponin negative x 1, ACS ruled out. -Continue Eliquis and statin  DVT prophylaxis: Eliquis Code Status: Full code Family Communication: Wife at bedside Disposition Plan: Patient is sick with COPD exacerbation requiring BiPAP also with CHF decompensation requiring IV diuresis, expect more than 2 midnight hospital stay Consults called: None Admission status: PCU admit   Emeline General MD [facility] Pager [phone]  08/23/2023, 3:09 PM

## 2023-08-23 NOTE — ED Triage Notes (Signed)
Patient comes in from home via [facility] with complaints of sob. According to EMS, the patient has has a cold recently, and was supposed to have a chest x-ray today. Pt received 1 albuterol treatment, 2 Duoneb treatments, 125 solumedrol, and 2g of magnesium.  Pt has a history of CHF, COPD, and AFIB. Pt is alert and oriented x4, and very labored with breathing at this time. ED Dr. Fuller Plan at pt's bedside.

## 2023-08-23 NOTE — Telephone Encounter (Signed)
NA. Will attempt call later.

## 2023-08-23 NOTE — ED Provider Notes (Addendum)
[facility] Provider Note    Event Date/Time   First MD Initiated Contact with Patient 08/23/23 1306     (approximate)   History   Shortness of Breath   HPI  [name] [name]. is a 83 y.o. male with COPD, A-fib on Eliquis, Tikosyn, on 2 to 3 L of oxygen at baseline who comes in with worsening shortness of breath.  Patient has a 1 week of worsening shortness of breath, cough, congestion.  He came in significantly tachypneic.  Patient given DuoNebs, Solu-Medrol, magnesium with EMS.  He has been compliant with his Eliquis and denies any falls or hitting his head.  He denies any worsening swelling in his legs but does have baseline swelling.   Physical Exam   Triage Vital Signs: ED Triage Vitals  Encounter Vitals Group     BP 08/23/23 1306 (!) 195/81     Systolic BP Percentile --      Diastolic BP Percentile --      Pulse Rate 08/23/23 1306 82     Resp 08/23/23 1306 (!) 24     Temp 08/23/23 1306 98.7 F (37.1 C)     Temp Source 08/23/23 1306 Oral     SpO2 08/23/23 1259 100 %     Weight --      Height --      Head Circumference --      Peak Flow --      Pain Score 08/23/23 1301 0     Pain Loc --      Pain Education --      Exclude from Growth Chart --     Most recent vital signs: Vitals:   08/23/23 1259 08/23/23 1306  BP:  (!) 195/81  Pulse:  82  Resp:  (!) 24  Temp:  98.7 F (37.1 C)  SpO2: 100% 100%     General: Awake, CV:  Good peripheral perfusion.  Resp:  Increased work of breathing, tight air exchange.  Pursed lips. Abd:  No distention.  Other:  1+ edema bilaterally   ED Results / Procedures / Treatments   Labs (all labs ordered are listed, but only abnormal results are displayed) Labs Reviewed  RESP PANEL BY RT-PCR (RSV, FLU A&B, COVID)  [id number]  CULTURE, BLOOD (ROUTINE X 2)  CULTURE, BLOOD (ROUTINE X 2)  CBC WITH DIFFERENTIAL/PLATELET  COMPREHENSIVE METABOLIC PANEL  BRAIN NATRIURETIC PEPTIDE  PROCALCITONIN  BLOOD  GAS, VENOUS  LACTIC ACID, PLASMA  LACTIC ACID, PLASMA  TROPONIN I (HIGH SENSITIVITY)     EKG  My interpretation of EKG:  EKG my interpretation sinus tachycardia rate of 100 without any ST elevation or T wave inversions, normal intervals  RADIOLOGY I have reviewed the xray personally and interpreted and no evidence of any pneumonia, does look concerning for edema.  PROCEDURES:  Critical Care performed: No  .1-3 Lead EKG Interpretation  Performed by: Concha Se, MD Authorized by: Concha Se, MD     Interpretation: normal     ECG rate:  80   ECG rate assessment: normal     Rhythm: sinus rhythm     Ectopy: none     Conduction: normal      MEDICATIONS ORDERED IN ED: Medications  furosemide (LASIX) injection 40 mg (has no administration in time range)     IMPRESSION / MDM / ASSESSMENT AND PLAN / ED COURSE  I reviewed the triage vital signs and the nursing notes.   Patient's presentation  is most consistent with acute presentation with potential threat to life or bodily function.   Patient comes in with worsening shortness of breath with pursed breathing could be COPD exacerbation versus CHF.  No fever seems less likely pneumonia.  Will get COVID, flu testing.  Patient placed on BiPAP due to work of breathing.  Lactate was normal.  White count was slightly elevated but similar to priors.  CMP was reassuring initial troponin negative BNP is normal.  COVID, flu are negative.  He does have evidence of hypercapnia CO2 is elevated.  On repeat evaluation patient looks more comfortable on BiPAP.  Will discuss with the hospitalist for admission.  The patient is on the cardiac monitor to evaluate for evidence of arrhythmia and/or significant heart rate changes.      FINAL CLINICAL IMPRESSION(S) / ED DIAGNOSES   Final diagnoses:  COPD exacerbation (HCC)  Acute respiratory failure with hypoxia and hypercapnia (HCC)     Rx / DC Orders   ED Discharge Orders     None         Note:  This document was prepared using Dragon voice recognition software and may include unintentional dictation errors.   Concha Se, MD 08/23/23 1424    Concha Se, MD 08/23/23 [phone]

## 2023-08-23 NOTE — Telephone Encounter (Signed)
Nfn

## 2023-08-23 NOTE — ED Notes (Addendum)
Pt refuses cardiac leads while sleeping. Pt educated on importance of leads. CCMD was notified.

## 2023-08-23 NOTE — Telephone Encounter (Signed)
[name] advised to get x-ray and labs done at [facility] medical mall. She reports patient is not breathing well. She did call EMS earlier today and was evaluated. She reports o2 was at 99% he is on 5L of oxygen. She reports patient does not want to go out today. I advised to go to ED today if patient is not breath well. She reports she will call EMS again to take to ED.

## 2023-08-23 NOTE — ED Notes (Signed)
Band-aid placed on bridge of nose for protection of bipap mask.

## 2023-08-24 DIAGNOSIS — I2510 Atherosclerotic heart disease of native coronary artery without angina pectoris: Secondary | ICD-10-CM

## 2023-08-24 DIAGNOSIS — J9621 Acute and chronic respiratory failure with hypoxia: Secondary | ICD-10-CM

## 2023-08-24 DIAGNOSIS — I5033 Acute on chronic diastolic (congestive) heart failure: Secondary | ICD-10-CM | POA: Diagnosis not present

## 2023-08-24 DIAGNOSIS — J441 Chronic obstructive pulmonary disease with (acute) exacerbation: Secondary | ICD-10-CM | POA: Diagnosis not present

## 2023-08-24 DIAGNOSIS — Z9861 Coronary angioplasty status: Secondary | ICD-10-CM

## 2023-08-24 DIAGNOSIS — I480 Paroxysmal atrial fibrillation: Secondary | ICD-10-CM

## 2023-08-24 LAB — CBC
HCT: 36 % — ABNORMAL LOW (ref 39.0–52.0)
Hemoglobin: 11.8 g/dL — ABNORMAL LOW (ref 13.0–17.0)
MCH: 30.5 pg (ref 26.0–34.0)
MCHC: 32.8 g/dL (ref 30.0–36.0)
MCV: 93 fL (ref 80.0–100.0)
Platelets: 249 10*3/uL (ref 150–400)
RBC: 3.87 MIL/uL — ABNORMAL LOW (ref 4.22–5.81)
RDW: 13.3 % (ref 11.5–15.5)
WBC: 5.6 10*3/uL (ref 4.0–10.5)
nRBC: 0 % (ref 0.0–0.2)

## 2023-08-24 LAB — MAGNESIUM: Magnesium: 2.5 mg/dL — ABNORMAL HIGH (ref 1.7–2.4)

## 2023-08-24 LAB — BASIC METABOLIC PANEL
Anion gap: 14 (ref 5–15)
BUN: 26 mg/dL — ABNORMAL HIGH (ref 8–23)
CO2: 31 mmol/L (ref 22–32)
Calcium: 8.4 mg/dL — ABNORMAL LOW (ref 8.9–10.3)
Chloride: 94 mmol/L — ABNORMAL LOW (ref 98–111)
Creatinine, Ser: 1 mg/dL (ref 0.61–1.24)
GFR, Estimated: >60 mL/min (ref >60)
Glucose, Bld: 162 mg/dL — ABNORMAL HIGH (ref 70–99)
Potassium: 3.6 mmol/L (ref 3.5–5.1)
Sodium: 139 mmol/L (ref 135–145)

## 2023-08-24 MED ORDER — FUROSEMIDE 10 MG/ML IJ SOLN
40.0000 mg | Freq: Every day | INTRAMUSCULAR | Status: DC
Start: 2023-08-25 — End: 2023-08-26
  Administered 2023-08-25 – 2023-08-26 (×2): 40 mg via INTRAVENOUS
  Filled 2023-08-24 (×2): qty 4

## 2023-08-24 MED ORDER — POTASSIUM CHLORIDE CRYS ER 20 MEQ PO TBCR
60.0000 meq | EXTENDED_RELEASE_TABLET | Freq: Once | ORAL | Status: AC
  Administered 2023-08-24: 60 meq via ORAL
  Filled 2023-08-24: qty 3

## 2023-08-24 NOTE — ED Notes (Signed)
Patient refuses to have cardiac monitoring equipment placed on him.

## 2023-08-24 NOTE — ED Notes (Addendum)
Pt was not on the cardiac monitor when taking over care, spoke with patient and he refused cardiac monitoring

## 2023-08-24 NOTE — Evaluation (Signed)
Occupational Therapy Evaluation Patient Details Name: [name] [name]. MRN: [id number] DOB: 21-Feb-1941 Today's Date: 08/24/2023   History of Present Illness 83 y.o. male with medical history significant of steroid-dependent COPD Gold stage III, chronic hypoxic respiratory failure on 3 to 4 L as needed, HTN, chronic HFpEF, CAD status post stenting in 2019, PAF on Tikosyn and Eliquis presented with worsening of cough wheezing shortness of breath.   Clinical Impression   Pt seen for OT evaluation, limited by pt's SOB. Spouse present and supportive throughout. Pt endorsing mild SOB at rest. SpO2 >90% throughout session. Exertional activity limited by pt's SOB. Pt has an aide come to assist with bathing x2/wk, works with PT at [facility] 3x/wk and tries to get to the gym 2x/wk. Pt demonstrating impaired activity tolerance, strength, and cardiopulmonary status. Pt eager to discharge home and continue to get stronger. Pt will benefit from skilled OT services to address activity impairments.     If plan is discharge home, recommend the following: A little help with walking and/or transfers;A little help with bathing/dressing/bathroom;Assistance with cooking/housework;Assist for transportation;Help with stairs or ramp for entrance;Direct supervision/assist for medications management    Functional Status Assessment  Patient has had a recent decline in their functional status and demonstrates the ability to make significant improvements in function in a reasonable and predictable amount of time.  Equipment Recommendations  BSC/3in1    Recommendations for Other Services       Precautions / Restrictions Precautions Precautions: Fall Restrictions Weight Bearing Restrictions Per Provider Order: No      Mobility Bed Mobility Overal bed mobility: Modified Independent                  Transfers                   General transfer comment: declined 2/2 SOB      Balance                                            ADL either performed or assessed with clinical judgement   ADL Overall ADL's : Needs assistance/impaired                                       General ADL Comments: Pt requires increased time/effort to complete exertional tasks. Anticipate increased need for assist for ADL mobility and LB ADL tasks.     Vision         Perception         Praxis         Pertinent Vitals/Pain Pain Assessment Pain Assessment: No/denies pain     Extremity/Trunk Assessment Upper Extremity Assessment Upper Extremity Assessment: Overall WFL for tasks assessed   Lower Extremity Assessment Lower Extremity Assessment: Defer to PT evaluation       Communication Communication Communication: No apparent difficulties   Cognition Arousal: Alert Behavior During Therapy: WFL for tasks assessed/performed Overall Cognitive Status: Within Functional Limits for tasks assessed                                       General Comments       Exercises Other Exercises Other Exercises: Pt/spouse educated  in role of acute OT, activity pacing, work simplification, and PLB   Shoulder Instructions      Home Living Family/patient expects to be discharged to:: Private residence Living Arrangements: Spouse/significant other Available Help at Discharge: Family;Available PRN/intermittently Type of Home: Independent living facility Home Access: Level entry     Home Layout: One level     Bathroom Shower/Tub: Producer, television/film/video: Standard     Home Equipment: Rollator (4 wheels);Shower seat;Transport chair;Electric scooter   Additional Comments: [employer]      Prior Functioning/Environment Prior Level of Function : Driving;Needs assist               ADLs Comments: Pt reports having aide come 2x/wk for bathing assist. Reports going to OP PT 3x/wk and 2 days/wk to gym        OT  Problem List: Decreased activity tolerance;Decreased knowledge of use of DME or AE;Cardiopulmonary status limiting activity      OT Treatment/Interventions: Self-care/ADL training;Therapeutic activities;Energy conservation;DME and/or AE instruction;Patient/family education    OT Goals(Current goals can be found in the care plan section) Acute Rehab OT Goals Patient Stated Goal: go home OT Goal Formulation: With patient/family Time For Goal Achievement: 09/07/23 Potential to Achieve Goals: Good ADL Goals Pt Will Perform Lower Body Dressing: with modified independence;with adaptive equipment;sitting/lateral leans;sit to/from stand Pt Will Transfer to Toilet: with modified independence;ambulating (LRAD) Additional ADL Goal #1: Pt will verbalize plan to implement at least 1 learned ECS/falls prevention strategy into daily ADL/mobility routine to maximize safety. Additional ADL Goal #2: Pt will complete seated grooming tasks without SOB, 2/2 opportunities  OT Frequency: Min 1X/week    Co-evaluation              AM-PAC OT "6 Clicks" Daily Activity     Outcome Measure Help from another person eating meals?: None Help from another person taking care of personal grooming?: None Help from another person toileting, which includes using toliet, bedpan, or urinal?: A Little Help from another person bathing (including washing, rinsing, drying)?: A Little Help from another person to put on and taking off regular upper body clothing?: None Help from another person to put on and taking off regular lower body clothing?: A Little 6 Click Score: 21   End of Session Equipment Utilized During Treatment: Oxygen  Activity Tolerance: Patient tolerated treatment well;Treatment limited secondary to medical complications (Comment) (SOB) Patient left: in bed  OT Visit Diagnosis: Other abnormalities of gait and mobility (R26.89)                Time: 3244-0102 OT Time Calculation (min): 18 min Charges:   OT General Charges $OT Visit: 1 Visit OT Evaluation $OT Eval Low Complexity: 1 Low  Arman Filter., MPH, MS, OTR/L ascom [phone] 08/24/23, 2:41 PM

## 2023-08-24 NOTE — ED Notes (Addendum)
Error

## 2023-08-24 NOTE — Progress Notes (Signed)
Pharmacy: Dofetilide (Tikosyn) -  Assessment and Electrolyte Replacement  Pharmacy consulted to assist in monitoring and replacing electrolytes in this 83 y.o. male admitted on 08/23/2023 and ordered dofetilide to be continued from PTA list.  First dofetilide dose: 08/23/23  Labs:    Component Value Date/Time   K 3.6 08/24/2023 0430   MG 2.5 (H) 08/24/2023 0430     Plan:  Potassium: K 3.5-3.7:  Give KCl 60 mEq po x1   Magnesium: Mg > 2: No additional supplementation needed   Thank you for allowing pharmacy to participate in this patient's care  Gardner Candle, PharmD, BCPS Clinical Pharmacist 08/24/2023 10:51 AM

## 2023-08-24 NOTE — Progress Notes (Signed)
PT Cancellation Note  Patient Details Name: [name] [name]. MRN: [id number] DOB: Oct 15, 1940   Cancelled Treatment:    Reason Eval/Treat Not Completed: Other (comment) Attempted to see pt x2 this AM, both times he displayed considerable dyspnea with accessory muscle breathing.  Pt reports both times he is not breathing well enough to consider moving with PT, politely refusing.  RN and wife in room on second attempt.  Will maintain on caseload and try to see as appropriate.   Malachi Pro, DPT 08/24/2023, 11:07 AM

## 2023-08-24 NOTE — ED Notes (Signed)
Patient given a breakfast tray.

## 2023-08-24 NOTE — Progress Notes (Signed)
Progress Note   Patient: [name] [name]. HQI:[id number] DOB: [birth year] DOA: 08/23/2023     1 DOS: the patient was seen and examined on 08/24/2023   Brief hospital course: [name] [name]. is a 83 y.o. male with medical history significant of steroid-dependent COPD Gold stage III, chronic hypoxic respiratory failure on 3 to 4 L as needed, HTN, chronic HFpEF, CAD status post stenting in 2019, PAF on Tikosyn and Eliquis presented with worsening of cough wheezing shortness of breath.  Patient is admitted to the hospitalist service for further management evaluation of respiratory failure secondary to acute COPD, CHF exacerbation.  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 Continue doxycycline Will continue tapering IV Solu-Medrol once symptoms improve Continue ICS and LABA DuoNebs and as needed albuterol Diamox x 2 Advised incentive spirometry and flutter valve   Acute on chronic HFpEF decompensation Patient will be continued on IV Lasix 40 mg daily. Monitor daily weights, strict input and output. Continue to monitor renal function, electrolytes. Echo from 3 months ago reviewed   PAF Patient is on Tikosyn and Cardizem therapy which will be continued. Continue Eliquis   CAD No chest pain or EKG changes noted. Troponin negative. Continue Eliquis and statin    Out of bed to chair. Incentive spirometry. Nursing supportive care. Fall, aspiration precautions. DVT prophylaxis   Code Status: Full Code  Subjective: Patient is seen and examined today morning.  He is alert, dyspneic, got DuoNeb inhalation therapy, still feels air hunger.  Telemetry noted to be tachycardia and tachypneic.  Physical Exam: Vitals:   08/24/23 1054 08/24/23 1200 08/24/23 1400 08/24/23 1640  BP:  125/75  138/78   Pulse:  76 82   Resp:  (!) 22 20   Temp: 98 F (36.7 C)     TempSrc: Oral     SpO2:  92% 91% 100%    General - Elderly Caucasian ill appearing male, moderate respiratory distress HEENT - PERRLA, EOMI, atraumatic head, non tender sinuses. Lung - Clear, basal rales, diffuse rhonchi, wheezes.  Using accessory muscles Heart - S1, S2 heard, no murmurs, rubs, 1+ pedal edema. Abdomen - Soft, non tender, nondistended, bowel sounds good Neuro - Alert, awake and oriented x 3, non focal exam. Skin - Warm and dry.  Data Reviewed:      Latest Ref Rng & Units 08/24/2023    4:30 AM 08/23/2023    1:25 PM 07/15/2023   11:14 AM  CBC  WBC 4.0 - 10.5 K/uL 5.6  13.5  13.4   Hemoglobin 13.0 - 17.0 g/dL 13.2  44.0  10.2   Hematocrit 39.0 - 52.0 % 36.0  43.0  38.3   Platelets 150 - 400 K/uL 249  304  360.0       Latest Ref Rng & Units 08/24/2023    4:30 AM 08/23/2023    1:25 PM 07/15/2023   11:14 AM  BMP  Glucose 70 - 99 mg/dL 725  366  90   BUN 8 - 23 mg/dL 26  23  26    Creatinine 0.61 - 1.24 mg/dL 4.40  3.47  4.25   Sodium 135 - 145 mmol/L 139  142  144   Potassium 3.5 - 5.1 mmol/L 3.6  4.0  4.8   Chloride 98 - 111 mmol/L 94  95  101   CO2 22 -  32 mmol/L 31  36  34   Calcium 8.9 - 10.3 mg/dL 8.4  9.1  9.2    DG Chest Portable 1 View Result Date: 08/23/2023 CLINICAL DATA:  Shortness of breath EXAM: PORTABLE CHEST 1 VIEW COMPARISON:  Chest radiograph dated 05/31/2023 FINDINGS: Hyperinflated lungs. Asymmetric lucency of the left upper lung. No focal consolidations. No pleural effusion or pneumothorax. The heart size and mediastinal contours are within normal limits. No acute osseous abnormality. IMPRESSION: 1. Hyperinflated lungs with asymmetric lucency of the left upper lung, in keeping with emphysematous changes. 2. No focal consolidations. Electronically Signed   By: Agustin Cree M.D.   On: 08/23/2023 14:30   Family Communication: Discussed with patient, he understand and agree. All  questions answereed.  Disposition: Status is: Inpatient Remains inpatient appropriate because: hypoxia, respiratory distress, copd  Planned Discharge Destination: Home with Home Health     Time spent: 40 minutes  Author: [name], MD 08/24/2023 5:14 PM Secure chat 7am to 7pm For on call review [email or web address].

## 2023-08-25 ENCOUNTER — Inpatient Hospital Stay (HOSPITAL_COMMUNITY)
Admit: 2023-08-25 | Discharge: 2023-08-25 | Disposition: A | Discharge status: Home / Self Care | Payer: Medicare Other | Attending: Internal Medicine | Admitting: Internal Medicine

## 2023-08-25 DIAGNOSIS — I480 Paroxysmal atrial fibrillation: Secondary | ICD-10-CM | POA: Diagnosis not present

## 2023-08-25 DIAGNOSIS — I5033 Acute on chronic diastolic (congestive) heart failure: Secondary | ICD-10-CM | POA: Diagnosis not present

## 2023-08-25 DIAGNOSIS — J441 Chronic obstructive pulmonary disease with (acute) exacerbation: Secondary | ICD-10-CM | POA: Diagnosis not present

## 2023-08-25 DIAGNOSIS — J9621 Acute and chronic respiratory failure with hypoxia: Secondary | ICD-10-CM | POA: Diagnosis not present

## 2023-08-25 DIAGNOSIS — J9600 Acute respiratory failure, unspecified whether with hypoxia or hypercapnia: Secondary | ICD-10-CM | POA: Diagnosis not present

## 2023-08-25 DIAGNOSIS — R0603 Acute respiratory distress: Secondary | ICD-10-CM | POA: Diagnosis not present

## 2023-08-25 DIAGNOSIS — I10 Essential (primary) hypertension: Secondary | ICD-10-CM

## 2023-08-25 LAB — BLOOD GAS, VENOUS
Bicarbonate: 45.2 mmol/L — ABNORMAL HIGH (ref 20.0–28.0)
O2 Content: 3 L/min
O2 Saturation: 52.4 mmol/L — ABNORMAL HIGH (ref 0.0–2.0)
Patient temperature: 37
Patient temperature: 52.4 %
pCO2, Ven: 80 mm[Hg] (ref 44–60)
pH, Ven: 7.36 (ref 7.25–7.43)
pO2, Ven: <45.2 mmol/L — CL (ref 32–45)

## 2023-08-25 LAB — RESPIRATORY PANEL BY PCR

## 2023-08-25 LAB — BASIC METABOLIC PANEL
Anion gap: 10 (ref 5–15)
BUN: 40 mg/dL — ABNORMAL HIGH (ref 8–23)
CO2: 31 mmol/L (ref 22–32)
Calcium: 8.6 mg/dL — ABNORMAL LOW (ref 8.9–10.3)
Chloride: 96 mmol/L — ABNORMAL LOW (ref 98–111)
Creatinine, Ser: 1.1 mg/dL (ref 0.61–1.24)
GFR, Estimated: >60 mL/min (ref >60)
Glucose, Bld: 179 mg/dL — ABNORMAL HIGH (ref 70–99)
Potassium: 3.9 mmol/L (ref 3.5–5.1)
Sodium: 137 mmol/L (ref 135–145)

## 2023-08-25 LAB — MAGNESIUM: Magnesium: 2.4 mg/dL (ref 1.7–2.4)

## 2023-08-25 MED ORDER — METHYLPREDNISOLONE SODIUM SUCC 40 MG IJ SOLR
40.0000 mg | Freq: Two times a day (BID) | INTRAMUSCULAR | Status: DC
Start: 2023-08-25 — End: 2023-08-27
  Administered 2023-08-25 – 2023-08-26 (×3): 40 mg via INTRAVENOUS
  Filled 2023-08-25 (×3): qty 1

## 2023-08-25 MED ORDER — SODIUM CHLORIDE 0.9 % IV SOLN: 500.0000 mg | INTRAVENOUS | Status: DC

## 2023-08-25 MED ORDER — SODIUM CHLORIDE 0.9 % IV SOLN
2.0000 g | INTRAVENOUS | Status: DC
  Administered 2023-08-25 – 2023-08-27 (×3): 2 g via INTRAVENOUS
  Filled 2023-08-25 (×4): qty 20

## 2023-08-25 MED ORDER — BUDESONIDE 0.5 MG/2ML IN SUSP
0.5000 mg | Freq: Two times a day (BID) | RESPIRATORY_TRACT | Status: DC
  Administered 2023-08-25 – 2023-09-01 (×15): 0.5 mg via RESPIRATORY_TRACT
  Filled 2023-08-25 (×15): qty 2

## 2023-08-25 MED ORDER — SODIUM CHLORIDE 0.9 % IV SOLN
100.0000 mg | Freq: Two times a day (BID) | INTRAVENOUS | Status: DC
  Administered 2023-08-25 – 2023-08-26 (×3): 100 mg via INTRAVENOUS
  Filled 2023-08-25 (×4): qty 100

## 2023-08-25 MED ORDER — POTASSIUM CHLORIDE CRYS ER 20 MEQ PO TBCR
40.0000 meq | EXTENDED_RELEASE_TABLET | Freq: Once | ORAL | Status: AC
Start: 2023-08-25 — End: 2023-08-25
  Administered 2023-08-25: 40 meq via ORAL
  Filled 2023-08-25: qty 2

## 2023-08-25 MED ORDER — IPRATROPIUM-ALBUTEROL 0.5-2.5 (3) MG/3ML IN SOLN
3.0000 mL | RESPIRATORY_TRACT | Status: DC
  Administered 2023-08-25 – 2023-08-27 (×12): 3 mL via RESPIRATORY_TRACT
  Filled 2023-08-25 (×11): qty 3

## 2023-08-25 MED ORDER — MORPHINE SULFATE (PF) 2 MG/ML IV SOLN
2.0000 mg | Freq: Once | INTRAVENOUS | Status: AC
  Administered 2023-08-25: 2 mg via INTRAVENOUS
  Filled 2023-08-25 (×2): qty 1

## 2023-08-25 NOTE — Progress Notes (Addendum)
PT Cancellation Note  Patient Details Name: Garwood Wentzell. MRN: [id number] DOB: 05-24-1941   Cancelled Treatment:    Reason Eval/Treat Not Completed: Fatigue/lethargy limiting ability to participate;Medical issues which prohibited therapy Attempted to see pt 2x again this date.  Similar presentation; on first attempt he was finishing a breathing treatment.  We agreed to try back later, on return he continued to have shallow, dyspneic breathing with accessory muscle use.  Nursing applying yet again another breathing treatment.  Pt, wife and nurse all agree not appropriate for PT at this time either... will maintain on caseload and continue to attempt to see as he is more able and appropriate.    PM addendum:  Attempted one more time this afternoon.  Pt out of ED in tele room, still with some labored breathing but looking better than this morning.  He was pleasant and able to speak in full sentences but asked to not start moving with PT until tomorrow.    Malachi Pro, DPT 08/25/2023, 11:06 AM

## 2023-08-25 NOTE — Progress Notes (Signed)
Progress Note   Patient: [name] [name]. ZOX:[id number] DOB: 1940/11/26 DOA: 08/23/2023     2 DOS: the patient was seen and examined on 08/25/2023   Brief hospital course: [name] [name]. is a 83 y.o. male with medical history significant of steroid-dependent COPD Gold stage III, chronic hypoxic respiratory failure on 3 to 4 L as needed, HTN, chronic HFpEF, CAD status post stenting in 2019, PAF on Tikosyn and Eliquis presented with worsening of cough wheezing shortness of breath.  Patient is admitted to the hospitalist service for further management evaluation of respiratory failure secondary to acute COPD, CHF exacerbation.  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  Continues to have severe shortness of breath at rest. Pulmonary evaluation is appreciated. Encourage incentive spirometry, flutter valve.  Continue doxycycline IV Solu-Medrol changed to 40mg  q12. Continue ICS, LAMA and LABA DuoNebs scheduled every 4. Morphine as needed ordered.  Acute on chronic HFpEF decompensation Continue IV Lasix 40 mg daily. His leg swelling much improved. Monitor daily weights, strict input and output. Continue to monitor renal function, electrolytes. Repeat complete echo ordered.   PAF Continue Tikosyn and Cardizem therapy. Monitor telemetry. Continue Eliquis   CAD No chest pain or EKG changes noted. Troponin negative. Continue Eliquis and statin    Out of bed to chair. Incentive spirometry. Nursing supportive care. Fall, aspiration precautions. DVT prophylaxis   Code Status: Full Code  Subjective: Patient is seen and examined today morning.  He complaining of severe respiratory distress even after DuoNeb.  Continues to be tachypneic.  RN at bedside giving his medications.  Wife at  bedside, his leg swelling is much improved.  Physical Exam: Vitals:   08/25/23 0810 08/25/23 0900 08/25/23 1000 08/25/23 1100  BP: (!) 138/57 130/61 (!) 172/79 139/77  Pulse: 87 77 (!) 105 (!) 45  Resp: 20 (!) 21    Temp: 97.8 F (36.6 C)     TempSrc: Oral     SpO2: 99% 100%      General - Elderly Caucasian ill appearing male, moderate respiratory distress HEENT - PERRLA, EOMI, atraumatic head, non tender sinuses. Lung - Clear, basal rales, diffuse rhonchi, wheezes.  Using accessory muscles Heart - S1, S2 heard, no murmurs, rubs, 1+ pedal edema. Abdomen - Soft, non tender, nondistended, bowel sounds good Neuro - Alert, awake and oriented x 3, non focal exam. Skin - Warm and dry.  Data Reviewed:      Latest Ref Rng & Units 08/24/2023    4:30 AM 08/23/2023    1:25 PM 07/15/2023   11:14 AM  CBC  WBC 4.0 - 10.5 K/uL 5.6  13.5  13.4   Hemoglobin 13.0 - 17.0 g/dL 81.1  91.4  78.2   Hematocrit 39.0 - 52.0 % 36.0  43.0  38.3   Platelets 150 - 400 K/uL 249  304  360.0       Latest Ref Rng & Units 08/25/2023    5:27 AM 08/24/2023    4:30 AM 08/23/2023    1:25 PM  BMP  Glucose 70 - 99 mg/dL 956  213  086   BUN 8 - 23 mg/dL 40  26  23   Creatinine 0.61 - 1.24 mg/dL 5.78  4.69  6.29   Sodium 135 - 145 mmol/L 137  139  142   Potassium 3.5 - 5.1  mmol/L 3.9  3.6  4.0   Chloride 98 - 111 mmol/L 96  94  95   CO2 22 - 32 mmol/L 31  31  36   Calcium 8.9 - 10.3 mg/dL 8.6  8.4  9.1    DG Chest Portable 1 View Result Date: 08/23/2023 CLINICAL DATA:  Shortness of breath EXAM: PORTABLE CHEST 1 VIEW COMPARISON:  Chest radiograph dated 05/31/2023 FINDINGS: Hyperinflated lungs. Asymmetric lucency of the left upper lung. No focal consolidations. No pleural effusion or pneumothorax. The heart size and mediastinal contours are within normal limits. No acute osseous abnormality. IMPRESSION: 1. Hyperinflated lungs with asymmetric lucency of the left upper lung, in keeping with emphysematous changes. 2.  No focal consolidations. Electronically Signed   By: Agustin Cree M.D.   On: 08/23/2023 14:30   Family Communication: Discussed with patient, wife at bedside, they  understand and agree. All questions answereed.  Disposition: Status is: Inpatient Remains inpatient appropriate because: hypoxia, respiratory distress, copd  Planned Discharge Destination: Home with Home Health     MDM level 3-patient is severely short of breath, has severe COPD, CHF.  Patient is on IV steroids, IV Lasix therapy, nebulizers, supplemental oxygen.  Pulmonologist evaluated the patient, patient is at high risk for sudden clinical deterioration.  Author: [name], MD 08/25/2023 12:09 PM Secure chat 7am to 7pm For on call review [email or web address].

## 2023-08-25 NOTE — Progress Notes (Signed)
Echocardiogram 2D Echocardiogram has been performed.  [name] [name] 08/25/2023, 4:28 PM

## 2023-08-25 NOTE — Progress Notes (Signed)
Pharmacy: Dofetilide (Tikosyn) -  Assessment and Electrolyte Replacement  Pharmacy consulted to assist in monitoring and replacing electrolytes in this 83 y.o. male admitted on 08/23/2023 and ordered dofetilide to be continued from PTA list.  First dofetilide dose: 08/23/23  Labs:    Component Value Date/Time   K 3.9 08/25/2023 0527   MG 2.4 08/25/2023 0527     Plan: K>4, Mag>2  Potassium: K 3.9: Kcl x 1  Magnesium: Mg 2.4: no replacement indicated   Thank you for allowing pharmacy to participate in this patient's care   Bettey Costa, PharmD Clinical Pharmacist 08/25/2023 9:38 AM

## 2023-08-25 NOTE — ED Notes (Signed)
ED TO INPATIENT HANDOFF REPORT  ED Nurse Name and Phone #: Raynelle Fanning, RN  S Name/Age/Gender [name] [name]. 83 y.o. male Room/Bed: ED12A/ED12A  Code Status   Code Status: Full Code  Home/SNF/Other Home Patient oriented to: self, place, time, and situation Is this baseline? Yes   Triage Complete: Triage complete  Chief Complaint COPD (chronic obstructive pulmonary disease) (HCC) [J44.9]  Triage Note Patient comes in from home via [facility] with complaints of sob. According to EMS, the patient has has a cold recently, and was supposed to have a chest x-ray today. Pt received 1 albuterol treatment, 2 Duoneb treatments, 125 solumedrol, and 2g of magnesium.  Pt has a history of CHF, COPD, and AFIB. Pt is alert and oriented x4, and very labored with breathing at this time. ED Dr. Fuller Plan at pt's bedside.   Allergies Allergies  Allergen Reactions   Flagyl [Metronidazole] Other (See Comments)    Other reaction(s): skin reaction    Level of Care/Admitting Diagnosis ED Disposition     ED Disposition  Admit   Condition  --   Comment  Hospital Area: [facility] [[id number]]  Level of Care: Progressive [102]  Admit to Progressive based on following criteria: RESPIRATORY PROBLEMS hypoxemic/hypercapnic respiratory failure that is responsive to NIPPV (BiPAP) or High Flow Nasal Cannula (6-80 lpm). Frequent assessment/intervention, no > Q2 hrs < Q4 hrs, to maintain oxygenation and pulmonary hygiene.  Covid Evaluation: Confirmed COVID Negative  Diagnosis: COPD (chronic obstructive pulmonary disease) [facility]) [[id number]]  Admitting Physician: Emeline General [[id number]]  Attending Physician: Emeline General [[id number]]  Certification:: I certify this patient will need inpatient services for at least 2 midnights  Expected Medical Readiness: 08/25/2023          B Medical/Surgery History Past Medical History:  Diagnosis Date   Abdominal discomfort 12/07/2020   Acquired trigger  finger of right middle finger 02/04/2020   Acute prostatitis 04/08/2018   Alcohol abuse    Alcohol dependence (HCC) 12/07/2020   Anal or rectal pain 04/08/2018   Annual physical exam 12/07/2020   Anorectal disorder 12/07/2020   Arthritis    Atherosclerotic heart disease of native coronary artery without angina pectoris 04/08/2018   a. 03/2018 PCI: LM nl, LAD 52m, LCX nl, OM1 70p (2.75x8 Synergy DES), 76m (2.5x24 Synergy DES), RCA 25p, RPL1 50. EF 55-65%.   Atrial flutter (HCC)    a. 07/2022 in setting of hospitalization for resp illness/RSV. CHA2DS2VASc = 4-->eliquis; b. 08/2022 s/p DCCV (100J); c. 09/26/2022 Recurrent Aflutter.   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 02/2022 Carotid U/S: 1-39% bilat ICA stenoses.   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   Contracture of palmar fascia 12/07/2020   COPD (chronic obstructive pulmonary disease) (HCC)    Corn of toe 12/07/2020   Corns and callosities 04/09/2018   Coronary artery disease    2019 with stents   Coronary atherosclerosis 05/07/2018   Degenerative disc disease, 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   Degenerative disc disease, 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   Diarrhea 04/09/2018   Double vision with both  eyes open 12/07/2020   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   Dysphagia 08/14/2018   Dyspnea    ED (erectile dysfunction)    ED (erectile dysfunction) of organic origin 12/07/2020   Elevated prostate specific antigen (PSA) 04/08/2018   Elevated PSA    Encounter for screening for other disorder  12/07/2020   Enlarged prostate without lower urinary tract symptoms (luts) 04/09/2018   Enthesopathy 12/07/2020   Essential (primary) hypertension 04/08/2018   Ex-smoker 04/09/2018   Exertional dyspnea 04/02/2018   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 07/2022 Echo: EF 65-70%, no rwma, mild LVH, nl RV fxn.   Hyperlipidemia    Hypertension    Hypoxemia 12/07/2020   Kidney cysts    Left knee pain 12/07/2020   Low back pain 04/08/2018   Male erectile dysfunction 04/09/2018   Mixed hyperlipidemia 04/08/2018   Nocturia more than twice per night 04/09/2018   Osteoarthritis of first carpometacarpal joint 04/08/2018   Osteoarthritis of knee 12/07/2020   Other long term (current) drug therapy 12/07/2020   Pain due to onychomycosis of toenail of left foot 07/20/2019   Pain in joint 04/09/2018   Pain in knee 04/09/2018   Pain in unspecified knee 12/07/2020   Pain of finger 04/09/2018   Palpitations    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HCC) 12/07/2020   Personal history of colonic polyps 12/07/2020   Pneumonia    Pneumothorax 04/12/2020   Polypharmacy 04/09/2018   Porokeratosis 01/13/2019   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HCC)    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   Pulmonary nodules 06/08/2016   Pure hypercholesterolemia 12/07/2020   Renal cyst 12/07/2020   Sleep disorder 04/08/2018   Status post bronchoscopy 04/12/2020   Tear of rotator cuff 04/09/2018   Tobacco user 12/07/2020   Uncomplicated alcohol dependence (HCC) 04/09/2018   Unspecified rotator cuff tear or rupture of unspecified shoulder, not specified as traumatic 12/07/2020   Unspecified tear of unspecified meniscus, current injury, unspecified knee, initial encounter 12/07/2020   Visual disturbance 12/07/2020   Vitamin  D deficiency 04/08/2018   Past Surgical History:  Procedure Laterality Date   BRONCHIAL BIOPSY  10/13/2019   Procedure: BRONCHIAL BIOPSIES;  Surgeon: Leslye Peer, MD;  Location: [facility] ENDOSCOPY;  Service: Pulmonary;;   BRONCHIAL BIOPSY  04/12/2020   Procedure: BRONCHIAL BIOPSIES;  Surgeon: Leslye Peer, MD;  Location: [facility] ENDOSCOPY;  Service: Pulmonary;;   BRONCHIAL BRUSHINGS  10/13/2019   Procedure: BRONCHIAL BRUSHINGS;  Surgeon: Leslye Peer, MD;  Location: [facility] ENDOSCOPY;  Service: Pulmonary;;   BRONCHIAL BRUSHINGS  04/12/2020   Procedure: BRONCHIAL BRUSHINGS;  Surgeon: Leslye Peer, MD;  Location: [facility] ENDOSCOPY;  Service: Pulmonary;;   BRONCHIAL NEEDLE ASPIRATION BIOPSY  10/13/2019   Procedure: BRONCHIAL NEEDLE ASPIRATION BIOPSIES;  Surgeon: Leslye Peer, MD;  Location: [facility] ENDOSCOPY;  Service: Pulmonary;;   BRONCHIAL NEEDLE ASPIRATION BIOPSY  04/12/2020   Procedure: BRONCHIAL NEEDLE ASPIRATION BIOPSIES;  Surgeon: Leslye Peer, MD;  Location: [facility] ENDOSCOPY;  Service: Pulmonary;;   BRONCHIAL WASHINGS  10/13/2019   Procedure: BRONCHIAL WASHINGS;  Surgeon: Leslye Peer, MD;  Location: [facility] ENDOSCOPY;  Service: Pulmonary;;   BRONCHIAL WASHINGS  04/12/2020   Procedure: BRONCHIAL WASHINGS;  Surgeon: Leslye Peer, MD;  Location: [facility] ENDOSCOPY;  Service: Pulmonary;;   CARDIAC CATHETERIZATION  2002  50% RCA   CARDIOVERSION N/A 09/17/2022   Procedure: CARDIOVERSION;  Surgeon: Iran Ouch, MD;  Location: [facility] ORS;  Service: Cardiovascular;  Laterality: N/A;   CATARACT EXTRACTION, BILATERAL     CORONARY STENT INTERVENTION N/A 04/15/2018   Procedure: CORONARY STENT INTERVENTION;  Surgeon: Corky Crafts, MD;  Location: [facility] INVASIVE CV LAB;  Service: Cardiovascular;  Laterality: N/A;  om1   EYE SURGERY     KNEE ARTHROSCOPY     LEFT HEART CATH AND CORONARY ANGIOGRAPHY N/A 04/15/2018   Procedure: LEFT HEART CATH AND CORONARY ANGIOGRAPHY;  Surgeon: Corky Crafts, MD;  Location:  [facility] INVASIVE CV LAB;  Service: Cardiovascular;  Laterality: N/A;   MULTIPLE TOOTH EXTRACTIONS     TONSILLECTOMY     VIDEO BRONCHOSCOPY  04/12/2020   VIDEO BRONCHOSCOPY WITH ENDOBRONCHIAL NAVIGATION N/A 07/26/2016   Procedure: VIDEO BRONCHOSCOPY WITH ENDOBRONCHIAL NAVIGATION;  Surgeon: Leslye Peer, MD;  Location: [facility] OR;  Service: Thoracic;  Laterality: N/A;   VIDEO BRONCHOSCOPY WITH ENDOBRONCHIAL NAVIGATION N/A 10/13/2019   Procedure: [facility] AND VIDEO BRONCHOSCOPY WITH ENDOBRONCHIAL NAVIGATION;  Surgeon: Leslye Peer, MD;  Location: [facility] ENDOSCOPY;  Service: Pulmonary;  Laterality: N/A;   VIDEO BRONCHOSCOPY WITH ENDOBRONCHIAL NAVIGATION N/A 04/12/2020   Procedure: VIDEO BRONCHOSCOPY WITH ENDOBRONCHIAL NAVIGATION;  Surgeon: Leslye Peer, MD;  Location: [facility] ENDOSCOPY;  Service: Pulmonary;  Laterality: N/A;     A IV Location/Drains/Wounds Patient Lines/Drains/Airways Status     Active Line/Drains/Airways     Name Placement date Placement time Site Days   Peripheral IV 08/23/23 20 G Right Antecubital 08/23/23  1256  Antecubital  2   Wound / Incision (Open or Dehisced) 06/02/23 Skin tear Elbow Anterior;Right 06/02/23  0929  Elbow  84            Intake/Output Last 24 hours No intake or output data in the 24 hours ending 08/25/23 1151  Labs/Imaging Results for orders placed or performed during the hospital encounter of 08/23/23 (from the past 48 hours)  Blood culture (routine x 2)     Status: None (Preliminary result)   Collection Time: 08/23/23  1:14 PM   Specimen: BLOOD  Result Value Ref Range   Specimen Description BLOOD RIGHT ANTECUBITAL    Special Requests      BOTTLES DRAWN AEROBIC AND ANAEROBIC Blood Culture adequate volume   Culture      NO GROWTH 2 DAYS Performed at [facility], [address] [place], [place] [address]    Report Status PENDING   CBC with Differential     Status: Abnormal   Collection Time: 08/23/23  1:25 PM  Result Value Ref Range    WBC 13.5 (H) 4.0 - 10.5 K/uL   RBC 4.45 4.22 - 5.81 MIL/uL   Hemoglobin 13.6 13.0 - 17.0 g/dL   HCT 19.1 47.8 - 29.5 %   MCV 96.6 80.0 - 100.0 fL   MCH 30.6 26.0 - 34.0 pg   MCHC 31.6 30.0 - 36.0 g/dL   RDW 62.1 30.8 - 65.7 %   Platelets 304 150 - 400 K/uL   nRBC 0.0 0.0 - 0.2 %   Neutrophils Relative % 91 %   Neutro Abs 12.2 (H) 1.7 - 7.7 K/uL   Lymphocytes Relative 5 %   Lymphs Abs 0.7 0.7 - 4.0 K/uL   Monocytes Relative 3 %   Monocytes Absolute 0.4 0.1 - 1.0 K/uL   Eosinophils Relative 0 %   Eosinophils Absolute 0.0 0.0 - 0.5 K/uL  Basophils Relative 0 %   Basophils Absolute 0.0 0.0 - 0.1 K/uL   Immature Granulocytes 1 %   Abs Immature Granulocytes 0.16 (H) 0.00 - 0.07 K/uL    Comment: Performed at [facility], [address] [place], [place] [address]  Comprehensive metabolic panel     Status: Abnormal   Collection Time: 08/23/23  1:25 PM  Result Value Ref Range   Sodium 142 135 - 145 mmol/L   Potassium 4.0 3.5 - 5.1 mmol/L   Chloride 95 (L) 98 - 111 mmol/L   CO2 36 (H) 22 - 32 mmol/L   Glucose, Bld 147 (H) 70 - 99 mg/dL    Comment: Glucose reference range applies only to samples taken after fasting for at least 8 hours.   BUN 23 8 - 23 mg/dL   Creatinine, Ser 6.04 0.61 - 1.24 mg/dL   Calcium 9.1 8.9 - 54.0 mg/dL   Total Protein 6.9 6.5 - 8.1 g/dL   Albumin 3.6 3.5 - 5.0 g/dL   AST 25 15 - 41 U/L   ALT 18 0 - 44 U/L   Alkaline Phosphatase 61 38 - 126 U/L   Total Bilirubin 0.4 0.0 - 1.2 mg/dL   GFR, Estimated >98 >11 mL/min    Comment: (NOTE) Calculated using the CKD-EPI Creatinine Equation (2021)    Anion gap 11 5 - 15    Comment: Performed at [facility], [address] [place], [place] [address]  Troponin I (High Sensitivity)     Status: None   Collection Time: 08/23/23  1:25 PM  Result Value Ref Range   Troponin I (High Sensitivity) 17 <18 ng/L    Comment: (NOTE) Elevated high sensitivity troponin I (hsTnI) values and significant   changes across serial measurements may suggest ACS but many other  chronic and acute conditions are known to elevate hsTnI results.  Refer to the "Links" section for chest pain algorithms and additional  guidance. Performed at [facility], [address] [place], [place] [address]   Brain natriuretic peptide     Status: None   Collection Time: 08/23/23  1:25 PM  Result Value Ref Range   B Natriuretic Peptide 74.5 0.0 - 100.0 pg/mL    Comment: Performed at [facility], [address] [place], [place] [address]  Resp panel by RT-PCR (RSV, Flu A&B, Covid) Anterior Nasal Swab     Status: None   Collection Time: 08/23/23  1:25 PM   Specimen: Anterior Nasal Swab  Result Value Ref Range   SARS Coronavirus 2 by RT PCR NEGATIVE NEGATIVE    Comment: (NOTE) SARS-CoV-2 target nucleic acids are NOT DETECTED.  The SARS-CoV-2 RNA is generally detectable in upper respiratory specimens during the acute phase of infection. The lowest concentration of SARS-CoV-2 viral copies this assay can detect is 138 copies/mL. A negative result does not preclude SARS-Cov-2 infection and should not be used as the sole basis for treatment or other patient management decisions. A negative result may occur with  improper specimen collection/handling, submission of specimen other than nasopharyngeal swab, presence of viral mutation(s) within the areas targeted by this assay, and inadequate number of viral copies(<138 copies/mL). A negative result must be combined with clinical observations, patient history, and epidemiological information. The expected result is Negative.  Fact Sheet for Patients:  BloggerCourse.com  Fact Sheet for Healthcare Providers:  SeriousBroker.it  This test is no t yet approved or cleared by the [place] FDA and  has been  authorized for detection and/or diagnosis of SARS-CoV-2 by FDA under an Emergency Use  Authorization (EUA). This EUA will remain  in effect (meaning this test can be used) for the duration of the COVID-19 declaration under Section 564(b)(1) of the Act, 21 U.S.C.section 360bbb-3(b)(1), unless the authorization is terminated  or revoked sooner.       Influenza A by PCR NEGATIVE NEGATIVE   Influenza B by PCR NEGATIVE NEGATIVE    Comment: (NOTE) The Xpert Xpress SARS-CoV-2/FLU/RSV plus assay is intended as an aid in the diagnosis of influenza from Nasopharyngeal swab specimens and should not be used as a sole basis for treatment. Nasal washings and aspirates are unacceptable for Xpert Xpress SARS-CoV-2/FLU/RSV testing.  Fact Sheet for Patients: BloggerCourse.com  Fact Sheet for Healthcare Providers: SeriousBroker.it  This test is not yet approved or cleared by the [place] FDA and has been authorized for detection and/or diagnosis of SARS-CoV-2 by FDA under an Emergency Use Authorization (EUA). This EUA will remain in effect (meaning this test can be used) for the duration of the COVID-19 declaration under Section 564(b)(1) of the Act, 21 U.S.C. section 360bbb-3(b)(1), unless the authorization is terminated or revoked.     Resp Syncytial Virus by PCR NEGATIVE NEGATIVE    Comment: (NOTE) Fact Sheet for Patients: BloggerCourse.com  Fact Sheet for Healthcare Providers: SeriousBroker.it  This test is not yet approved or cleared by the [place] FDA and has been authorized for detection and/or diagnosis of SARS-CoV-2 by FDA under an Emergency Use Authorization (EUA). This EUA will remain in effect (meaning this test can be used) for the duration of the COVID-19 declaration under Section 564(b)(1) of the Act, 21 U.S.C. section 360bbb-3(b)(1), unless the authorization is terminated or revoked.  Performed at [facility], [address]  [place], [place] [address]   Procalcitonin     Status: None   Collection Time: 08/23/23  1:25 PM  Result Value Ref Range   Procalcitonin <0.10 ng/mL    Comment:        Interpretation: PCT (Procalcitonin) <= 0.5 ng/mL: Systemic infection (sepsis) is not likely. Local bacterial infection is possible. (NOTE)       Sepsis PCT Algorithm           Lower Respiratory Tract                                      Infection PCT Algorithm    ----------------------------     ----------------------------         PCT < 0.25 ng/mL                PCT < 0.10 ng/mL          Strongly encourage             Strongly discourage   discontinuation of antibiotics    initiation of antibiotics    ----------------------------     -----------------------------       PCT 0.25 - 0.50 ng/mL            PCT 0.10 - 0.25 ng/mL               OR       >80% decrease in PCT            Discourage initiation of  antibiotics      Encourage discontinuation           of antibiotics    ----------------------------     -----------------------------         PCT >= 0.50 ng/mL              PCT 0.26 - 0.50 ng/mL               AND        <80% decrease in PCT             Encourage initiation of                                             antibiotics       Encourage continuation           of antibiotics    ----------------------------     -----------------------------        PCT >= 0.50 ng/mL                  PCT > 0.50 ng/mL               AND         increase in PCT                  Strongly encourage                                      initiation of antibiotics    Strongly encourage escalation           of antibiotics                                     -----------------------------                                           PCT <= 0.25 ng/mL                                                 OR                                        > 80% decrease in PCT                                       Discontinue / Do not initiate                                             antibiotics  Performed at [facility], [address] [place], [place] [address]   Blood gas, venous     Status: Abnormal   Collection  Time: 08/23/23  1:25 PM  Result Value Ref Range   O2 Content 3.0 L/min   Delivery systems NASAL CANNULA    pH, Ven 7.36 7.25 - 7.43   pCO2, Ven 80 (HH) 44 - 60 mmHg    Comment: CRITICAL RESULT CALLED TO, READ BACK BY AND VERIFIED WITH: RESULTS CALLED TO VET HOOKER, RN BY ANF, RRT AT 1333 ON 08/23/23    pO2, Ven <31 (LL) 32 - 45 mmHg   Bicarbonate 45.2 (H) 20.0 - 28.0 mmol/L   Acid-Base Excess 15.6 (H) 0.0 - 2.0 mmol/L   O2 Saturation 52.4 %   Patient temperature 37.0    Collection site VEIN     Comment: Performed at [facility], [address] [place], [place] [address]  Lactic acid, plasma     Status: None   Collection Time: 08/23/23  1:25 PM  Result Value Ref Range   Lactic Acid, Venous 1.2 0.5 - 1.9 mmol/L    Comment: Performed at [facility], [address] [place], [place] [address]  Lactic acid, plasma     Status: None   Collection Time: 08/23/23  8:10 PM  Result Value Ref Range   Lactic Acid, Venous 1.3 0.5 - 1.9 mmol/L    Comment: Performed at [facility], [address] [place], [place] [address]  Troponin I (High Sensitivity)     Status: Abnormal   Collection Time: 08/23/23  8:10 PM  Result Value Ref Range   Troponin I (High Sensitivity) 18 (H) <18 ng/L    Comment: (NOTE) Elevated high sensitivity troponin I (hsTnI) values and significant  changes across serial measurements may suggest ACS but many other  chronic and acute conditions are known to elevate hsTnI results.  Refer to the "Links" section for chest pain algorithms and additional  guidance. Performed at [facility], [address] [place], [place] [address]   CBC     Status: Abnormal   Collection Time: 08/24/23  4:30 AM  Result Value Ref  Range   WBC 5.6 4.0 - 10.5 K/uL   RBC 3.87 (L) 4.22 - 5.81 MIL/uL   Hemoglobin 11.8 (L) 13.0 - 17.0 g/dL   HCT 42.5 (L) 95.6 - 38.7 %   MCV 93.0 80.0 - 100.0 fL   MCH 30.5 26.0 - 34.0 pg   MCHC 32.8 30.0 - 36.0 g/dL   RDW 56.4 33.2 - 95.1 %   Platelets 249 150 - 400 K/uL   nRBC 0.0 0.0 - 0.2 %    Comment: Performed at [facility], [address], [place], [place] [address]  Basic metabolic panel     Status: Abnormal   Collection Time: 08/24/23  4:30 AM  Result Value Ref Range   Sodium 139 135 - 145 mmol/L   Potassium 3.6 3.5 - 5.1 mmol/L   Chloride 94 (L) 98 - 111 mmol/L   CO2 31 22 - 32 mmol/L   Glucose, Bld 162 (H) 70 - 99 mg/dL    Comment: Glucose reference range applies only to samples taken after fasting for at least 8 hours.   BUN 26 (H) 8 - 23 mg/dL   Creatinine, Ser 6.06 0.61 - 1.24 mg/dL   Calcium 8.4 (L) 8.9 - 10.3 mg/dL   GFR, Estimated >30 >16 mL/min    Comment: (NOTE) Calculated using the CKD-EPI Creatinine Equation (2021)    Anion gap 14 5 - 15    Comment: Performed at [facility], [address] [place], [place] [address]  Magnesium     Status: Abnormal   Collection Time: 08/24/23  4:30 AM  Result Value Ref Range   Magnesium 2.5 (H) 1.7 - 2.4 mg/dL    Comment: Performed at [facility], [address] [place], [place] [address]  Magnesium     Status: None   Collection Time: 08/25/23  5:27 AM  Result Value Ref Range   Magnesium 2.4 1.7 - 2.4 mg/dL    Comment: Performed at [facility], [address] [place], [place] [address]  Basic metabolic panel     Status: Abnormal   Collection Time: 08/25/23  5:27 AM  Result Value Ref Range   Sodium 137 135 - 145 mmol/L   Potassium 3.9 3.5 - 5.1 mmol/L   Chloride 96 (L) 98 - 111 mmol/L   CO2 31 22 - 32 mmol/L   Glucose, Bld 179 (H) 70 - 99 mg/dL    Comment: Glucose reference range applies only to samples taken after fasting for at least 8 hours.   BUN 40 (H) 8 - 23 mg/dL    Creatinine, Ser 9.14 0.61 - 1.24 mg/dL   Calcium 8.6 (L) 8.9 - 10.3 mg/dL   GFR, Estimated >78 >29 mL/min    Comment: (NOTE) Calculated using the CKD-EPI Creatinine Equation (2021)    Anion gap 10 5 - 15    Comment: Performed at [facility], [address] [place], [place] [address]   DG Chest Portable 1 View Result Date: 08/23/2023 CLINICAL DATA:  Shortness of breath EXAM: PORTABLE CHEST 1 VIEW COMPARISON:  Chest radiograph dated 05/31/2023 FINDINGS: Hyperinflated lungs. Asymmetric lucency of the left upper lung. No focal consolidations. No pleural effusion or pneumothorax. The heart size and mediastinal contours are within normal limits. No acute osseous abnormality. IMPRESSION: 1. Hyperinflated lungs with asymmetric lucency of the left upper lung, in keeping with emphysematous changes. 2. No focal consolidations. Electronically Signed   By: Agustin Cree M.D.   On: 08/23/2023 14:30    Pending Labs Unresulted Labs (From admission, onward)     Start     Ordered   08/25/23 1059  Respiratory (~20 pathogens) panel by PCR  (Respiratory panel by PCR (~20 pathogens, ~24 hr TAT)  w precautions)  ONCE - URGENT,   URGENT        08/25/23 1058   08/25/23 1059  Legionella Pneumophila Serogp 1 Ur Ag  Once,   R        08/25/23 1058   08/25/23 1059  Strep pneumoniae urinary antigen  Once,   R        08/25/23 1058   08/23/23 1319  Blood culture (routine x 2)  BLOOD CULTURE X 2,   STAT      08/23/23 1319            Vitals/Pain Today's Vitals   08/25/23 0900 08/25/23 1000 08/25/23 1100 08/25/23 1125  BP: 130/61 (!) 172/79 139/77   Pulse: 77 (!) 105 (!) 45   Resp: (!) 21     Temp:      TempSrc:      SpO2: 100%     PainSc:    0-No pain    Isolation Precautions No active isolations  Medications Medications  acetaZOLAMIDE (DIAMOX) tablet 250 mg (250 mg Oral Given 08/23/23 2229)  diltiazem (CARDIZEM CD) 24 hr capsule 120 mg (120 mg Oral Given 08/25/23 0925)  dofetilide (TIKOSYN)  capsule 250 mcg (250 mcg Oral Given 08/25/23  0981)  rosuvastatin (CRESTOR) tablet 20 mg (20 mg Oral Given 08/25/23 0925)  pantoprazole (PROTONIX) EC tablet 40 mg (40 mg Oral Given 08/25/23 0924)  finasteride (PROSCAR) tablet 5 mg (5 mg Oral Given 08/25/23 0925)  clonazePAM (KLONOPIN) tablet 0.5 mg (0.5 mg Oral Given 08/24/23 2119)  gabapentin (NEURONTIN) capsule 200 mg (200 mg Oral Given 08/24/23 2119)  sodium chloride HYPERTONIC 3 % nebulizer solution 4 mL (4 mLs Nebulization Given 08/25/23 0950)  ondansetron (ZOFRAN) tablet 4 mg (has no administration in time range)    Or  ondansetron (ZOFRAN) injection 4 mg (has no administration in time range)  acetaminophen (TYLENOL) tablet 650 mg (has no administration in time range)    Or  acetaminophen (TYLENOL) suppository 650 mg (has no administration in time range)  fluticasone (FLONASE) 50 MCG/ACT nasal spray 2 spray (2 sprays Each Nare Given 08/25/23 0926)  apixaban (ELIQUIS) tablet 5 mg (5 mg Oral Given 08/25/23 0924)  albuterol (PROVENTIL) (2.5 MG/3ML) 0.083% nebulizer solution 2.5 mg (2.5 mg Nebulization Given 08/24/23 1638)  fluticasone furoate-vilanterol (BREO ELLIPTA) 200-25 MCG/ACT 1 puff (1 puff Inhalation Given 08/25/23 0928)    And  umeclidinium bromide (INCRUSE ELLIPTA) 62.5 MCG/ACT 1 puff (1 puff Inhalation Given 08/25/23 0929)  furosemide (LASIX) injection 40 mg (40 mg Intravenous Given 08/25/23 0922)  methylPREDNISolone sodium succinate (SOLU-MEDROL) 40 mg/mL injection 40 mg (has no administration in time range)  ipratropium-albuterol (DUONEB) 0.5-2.5 (3) MG/3ML nebulizer solution 3 mL (3 mLs Nebulization Given 08/25/23 1142)  budesonide (PULMICORT) nebulizer solution 0.5 mg (0.5 mg Nebulization Given 08/25/23 1041)  cefTRIAXone (ROCEPHIN) 2 g in sodium chloride 0.9 % 100 mL IVPB (2 g Intravenous New Bag/Given 08/25/23 1144)  doxycycline (VIBRAMYCIN) 100 mg in sodium chloride 0.9 % 250 mL IVPB (has no administration in time range)  furosemide  (LASIX) injection 40 mg (40 mg Intravenous Given 08/23/23 1442)  cefTRIAXone (ROCEPHIN) 1 g in sodium chloride 0.9 % 100 mL IVPB (0 g Intravenous Stopped 08/23/23 1523)  doxycycline (VIBRA-TABS) tablet 100 mg (100 mg Oral Given 08/23/23 1441)  furosemide (LASIX) injection 40 mg (40 mg Intravenous Given 08/24/23 1015)  sodium chloride (OCEAN) 0.65 % nasal spray 1 spray (1 spray Each Nare Given 08/23/23 2230)  potassium chloride SA (KLOR-CON M) CR tablet 60 mEq (60 mEq Oral Given 08/24/23 1106)  potassium chloride SA (KLOR-CON M) CR tablet 40 mEq (40 mEq Oral Given 08/25/23 0946)  morphine (PF) 2 MG/ML injection 2 mg (2 mg Intravenous Given 08/25/23 1056)    Mobility walks     Focused Assessments Cardiac Assessment Handoff:  Cardiac Rhythm: Normal sinus rhythm No results found for: "CKTOTAL", "CKMB", "CKMBINDEX", "TROPONINI" Lab Results  Component Value Date   DDIMER 0.29 05/30/2022   Does the Patient currently have chest pain? No    R Recommendations: See Admitting Provider Note  Report given to:   Additional Notes: Pt A&O x4, being admitted for COPD & CHF

## 2023-08-25 NOTE — Consult Note (Signed)
[facility] Consultation      Date: 08/25/2023,   MRN# [id number] [name] [name]. 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 He had hyperinflation (TLC 131% predicted) and air trapping (RV/TLC at 160% predicted). DLCO is severely reduced at 38% predicted. This is overall consistent with GOLD 3E COPD with recurrent exacerbations maintained on triple therapy LABA/LAMA/ICS Markus Daft) and chronically on steroids (10 mg of prednisone daily)   CHIEF COMPLAINT:   Increased WOB   HISTORY OF PRESENT ILLNESS   83 y.o. male with medical history significant of steroid-dependent COPD Gold stage III, chronic hypoxic respiratory failure on 3 to 4 L as needed, HTN,  chronic HFpEF, CAD status post stenting in 2019, PAF on Tikosyn and Eliquis   Presented with worsening of cough wheezing shortness of breath.  Patient is admitted to the hospitalist service for further management evaluation of respiratory failure secondary to acute COPD, CHF exacerbation.   Patient placed on biPAP, weaned off at this time Remains very SOB, using accessory muscles to breathe  PCCM asked to see patient  PAST MEDICAL HISTORY   Past Medical History:  Diagnosis Date   Abdominal discomfort 12/07/2020   Acquired trigger finger of right middle finger 02/04/2020   Acute prostatitis 04/08/2018   Alcohol abuse    Alcohol dependence (HCC) 12/07/2020   Anal or rectal pain 04/08/2018   Annual physical exam 12/07/2020   Anorectal disorder 12/07/2020   Arthritis    Atherosclerotic heart disease of native coronary artery without angina pectoris 04/08/2018   a. 03/2018 PCI: LM nl, LAD 71m, LCX nl, OM1 70p (2.75x8 Synergy DES), 63m (2.5x24 Synergy DES), RCA 25p, RPL1 50. EF 55-65%.    Atrial flutter (HCC)    a. 07/2022 in setting of hospitalization for resp illness/RSV. CHA2DS2VASc = 4-->eliquis; b. 08/2022 s/p DCCV (100J); c. 09/26/2022 Recurrent Aflutter.   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 02/2022 Carotid U/S: 1-39% bilat ICA stenoses.   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   Contracture of palmar fascia 12/07/2020   COPD (chronic obstructive pulmonary disease) (HCC)    Corn of toe 12/07/2020   Corns and callosities 04/09/2018   Coronary artery disease    2019 with stents   Coronary atherosclerosis 05/07/2018   Degenerative disc disease, 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   Degenerative disc disease, 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   Diarrhea 04/09/2018   Double vision with both eyes open 12/07/2020   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   Dysphagia 08/14/2018   Dyspnea    ED (erectile dysfunction)    ED (erectile dysfunction) of organic origin 12/07/2020   Elevated prostate specific antigen (PSA) 04/08/2018   Elevated PSA    Encounter for screening for other disorder 12/07/2020   Enlarged prostate without lower urinary tract symptoms (luts) 04/09/2018   Enthesopathy 12/07/2020   Essential (primary) hypertension 04/08/2018   Ex-smoker 04/09/2018   Exertional dyspnea 04/02/2018  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   Flatulence 04/08/2018   Gastro-esophageal reflux disease without esophagitis 04/08/2018   GERD (gastroesophageal reflux disease)    History of acute otitis externa 08/14/2018   History of  echocardiogram    a. 07/2022 Echo: EF 65-70%, no rwma, mild LVH, nl RV fxn.   Hyperlipidemia    Hypertension    Hypoxemia 12/07/2020   Kidney cysts    Left knee pain 12/07/2020   Low back pain 04/08/2018   Male erectile dysfunction 04/09/2018   Mixed hyperlipidemia 04/08/2018   Nocturia more than twice per night 04/09/2018   Osteoarthritis of first carpometacarpal joint 04/08/2018   Osteoarthritis of knee 12/07/2020   Other long term (current) drug therapy 12/07/2020   Pain due to onychomycosis of toenail of left foot 07/20/2019   Pain in joint 04/09/2018   Pain in knee 04/09/2018   Pain in unspecified knee 12/07/2020   Pain of finger 04/09/2018   Palpitations    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HCC) 12/07/2020   Personal history of colonic polyps 12/07/2020   Pneumonia    Pneumothorax 04/12/2020   Polypharmacy 04/09/2018   Porokeratosis 01/13/2019   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HCC)    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   Pulmonary nodules 06/08/2016   Pure hypercholesterolemia 12/07/2020   Renal cyst 12/07/2020   Sleep disorder 04/08/2018   Status post bronchoscopy 04/12/2020   Tear of rotator cuff 04/09/2018   Tobacco user 12/07/2020   Uncomplicated alcohol dependence (HCC) 04/09/2018   Unspecified rotator cuff tear or rupture of unspecified shoulder, not specified as traumatic 12/07/2020   Unspecified tear of unspecified meniscus, current injury, unspecified knee, initial encounter 12/07/2020   Visual disturbance 12/07/2020   Vitamin D deficiency 04/08/2018     SURGICAL HISTORY   Past Surgical History:  Procedure Laterality Date   BRONCHIAL BIOPSY  10/13/2019   Procedure: BRONCHIAL BIOPSIES;  Surgeon: Leslye Peer, MD;  Location: [facility] ENDOSCOPY;  Service: Pulmonary;;   BRONCHIAL BIOPSY  04/12/2020   Procedure: BRONCHIAL BIOPSIES;  Surgeon: Leslye Peer, MD;  Location: [facility] ENDOSCOPY;  Service: Pulmonary;;   BRONCHIAL BRUSHINGS  10/13/2019   Procedure: BRONCHIAL BRUSHINGS;   Surgeon: Leslye Peer, MD;  Location: [facility] ENDOSCOPY;  Service: Pulmonary;;   BRONCHIAL BRUSHINGS  04/12/2020   Procedure: BRONCHIAL BRUSHINGS;  Surgeon: Leslye Peer, MD;  Location: [facility] ENDOSCOPY;  Service: Pulmonary;;   BRONCHIAL NEEDLE ASPIRATION BIOPSY  10/13/2019   Procedure: BRONCHIAL NEEDLE ASPIRATION BIOPSIES;  Surgeon: Leslye Peer, MD;  Location: [facility] ENDOSCOPY;  Service: Pulmonary;;   BRONCHIAL NEEDLE ASPIRATION BIOPSY  04/12/2020   Procedure: BRONCHIAL NEEDLE ASPIRATION BIOPSIES;  Surgeon: Leslye Peer, MD;  Location[facility] ENDOSCOPY;  Service: Pulmonary;;   BRONCHIAL WASHINGS  10/13/2019   Procedure: BRONCHIAL WASHINGS;  Surgeon: Leslye Peer, MD;  Location: [facility] ENDOSCOPY;  Service: Pulmonary;;   BRONCHIAL WASHINGS  04/12/2020   Procedure: BRONCHIAL WASHINGS;  Surgeon: Leslye Peer, MD;  Location: [facility] ENDOSCOPY;  Service: Pulmonary;;   CARDIAC CATHETERIZATION  2002   50% RCA   CARDIOVERSION N/A 09/17/2022   Procedure: CARDIOVERSION;  Surgeon: Iran Ouch, MD;  Location: [facility] ORS;  Service: Cardiovascular;  Laterality: N/A;   CATARACT EXTRACTION, BILATERAL     CORONARY STENT INTERVENTION N/A 04/15/2018   Procedure: CORONARY STENT INTERVENTION;  Surgeon: Corky Crafts, MD;  Location: [facility] INVASIVE CV LAB;  Service: Cardiovascular;  Laterality: N/A;  om1   EYE SURGERY     KNEE ARTHROSCOPY  LEFT HEART CATH AND CORONARY ANGIOGRAPHY N/A 04/15/2018   Procedure: LEFT HEART CATH AND CORONARY ANGIOGRAPHY;  Surgeon: Corky Crafts, MD;  Location: [facility] INVASIVE CV LAB;  Service: Cardiovascular;  Laterality: N/A;   MULTIPLE TOOTH EXTRACTIONS     TONSILLECTOMY     VIDEO BRONCHOSCOPY  04/12/2020   VIDEO BRONCHOSCOPY WITH ENDOBRONCHIAL NAVIGATION N/A 07/26/2016   Procedure: VIDEO BRONCHOSCOPY WITH ENDOBRONCHIAL NAVIGATION;  Surgeon: Leslye Peer, MD;  Location: [facility] OR;  Service: Thoracic;  Laterality: N/A;   VIDEO BRONCHOSCOPY WITH ENDOBRONCHIAL NAVIGATION N/A 10/13/2019    Procedure: [place] AND VIDEO BRONCHOSCOPY WITH ENDOBRONCHIAL NAVIGATION;  Surgeon: Leslye Peer, MD;  Location: [facility] ENDOSCOPY;  Service: Pulmonary;  Laterality: N/A;   VIDEO BRONCHOSCOPY WITH ENDOBRONCHIAL NAVIGATION N/A 04/12/2020   Procedure: VIDEO BRONCHOSCOPY WITH ENDOBRONCHIAL NAVIGATION;  Surgeon: Leslye Peer, MD;  Location: [facility] ENDOSCOPY;  Service: Pulmonary;  Laterality: N/A;     FAMILY HISTORY   Family History  Problem Relation Age of Onset   Hypertension Mother    Alzheimer's disease Mother      SOCIAL HISTORY   Social History   Tobacco Use   Smoking status: Former    Current packs/day: 0.00    Average packs/day: 0.8 packs/day for 68.0 years (51.0 ttl pk-yrs)    Types: Cigarettes    Start date: 40    Quit date: 07/13/2020    Years since quitting: 3.1    Passive exposure: Never   Smokeless tobacco: Never   Tobacco comments:    Recent Quit    Vaping Use   Vaping status: Former  Substance Use Topics   Alcohol use: No    Alcohol/week: 0.0 standard drinks of alcohol    Comment: quit drinking 2014   Drug use: No     MEDICATIONS    Home Medication:  Current Outpatient Rx   Order #: [id number] Class: Historical Med   Order #: [id number] Class: Normal   Order #: [id number] Class: Normal   Order #: [id number] Class: Normal   Order #: [id number] Class: Normal   Order #: [id number] Class: Normal   Order #: [id number] Class: Historical Med   Order #: [id number] Class: Normal   Order #: [id number] Class: Historical Med   Order #: [id number] Class: Normal   Order #: [id number] Class: Historical Med   Order #: [id number] Class: Normal   Order #: [id number] Class: Normal   Order #: [id number] Class: Normal   Order #: [id number] Class: Normal   Order #: [id number] Class: Historical Med   Order #: [id number] Class: Normal   Order #: [id number] Class: Normal   Order #: [id number] Class: Normal   Order #: [id number] Class: Historical Med   Order #: [id number] Class: Normal   Order #:  [id number] Class: Normal   Order #: [id number] Class: Normal    Current Medication:  Current Facility-Administered Medications:    acetaminophen (TYLENOL) tablet 650 mg, 650 mg, Oral, Q6H PRN **OR** acetaminophen (TYLENOL) suppository 650 mg, 650 mg, Rectal, Q6H PRN, Mikey College T, MD   albuterol (PROVENTIL) (2.5 MG/3ML) 0.083% nebulizer solution 2.5 mg, 2.5 mg, Nebulization, Q6H PRN, Mikey College T, MD, 2.5 mg at 08/24/23 1638   apixaban (ELIQUIS) tablet 5 mg, 5 mg, Oral, BID, Mikey College T, MD, 5 mg at 08/25/23 0924   budesonide (PULMICORT) nebulizer solution 0.5 mg, 0.5 mg, Nebulization, BID, Andrei Mccook, MD   clonazePAM (KLONOPIN) tablet 0.5 mg, 0.5 mg, Oral, QHS, Zhang, Ping T, MD, 0.5 mg at 08/24/23 2119   diltiazem (CARDIZEM CD) 24 hr capsule 120 mg, 120 mg, Oral, BID, Emeline General, MD, 120 mg  at 08/25/23 0925   dofetilide (TIKOSYN) capsule 250 mcg, 250 mcg, Oral, BID, Mikey College T, MD, 250 mcg at 08/25/23 0947   doxycycline (VIBRA-TABS) tablet 100 mg, 100 mg, Oral, Q12H, Mikey College T, MD, 100 mg at 08/25/23 1610   finasteride (PROSCAR) tablet 5 mg, 5 mg, Oral, Daily, Mikey College T, MD, 5 mg at 08/25/23 0925   fluticasone (FLONASE) 50 MCG/ACT nasal spray 2 spray, 2 spray, Each Nare, Daily, Mikey College T, MD, 2 spray at 08/25/23 0926   fluticasone furoate-vilanterol (BREO ELLIPTA) 200-25 MCG/ACT 1 puff, 1 puff, Inhalation, Daily, 1 puff at 08/25/23 0928 **AND** umeclidinium bromide (INCRUSE ELLIPTA) 62.5 MCG/ACT 1 puff, 1 puff, Inhalation, Daily, Mikey College T, MD, 1 puff at 08/25/23 0929   furosemide (LASIX) injection 40 mg, 40 mg, Intravenous, Daily, [name], MD, 40 mg at 08/25/23 9604   gabapentin (NEURONTIN) capsule 200 mg, 200 mg, Oral, QHS, Mikey College T, MD, 200 mg at 08/24/23 2119   ipratropium-albuterol (DUONEB) 0.5-2.5 (3) MG/3ML nebulizer solution 3 mL, 3 mL, Nebulization, Q4H, Mikey College T, MD, 3 mL at 08/25/23 0929   ipratropium-albuterol (DUONEB) 0.5-2.5 (3)  MG/3ML nebulizer solution 3 mL, 3 mL, Nebulization, Q4H, Archita Lomeli, MD   methylPREDNISolone sodium succinate (SOLU-MEDROL) 40 mg/mL injection 40 mg, 40 mg, Intravenous, Q12H, Gwenivere Hiraldo, MD   morphine (PF) 2 MG/ML injection 2 mg, 2 mg, Intravenous, Once, Trampus Mcquerry, MD   ondansetron (ZOFRAN) tablet 4 mg, 4 mg, Oral, Q6H PRN **OR** ondansetron (ZOFRAN) injection 4 mg, 4 mg, Intravenous, Q6H PRN, Mikey College T, MD   pantoprazole (PROTONIX) EC tablet 40 mg, 40 mg, Oral, Daily, Chipper Herb, Ping T, MD, 40 mg at 08/25/23 0924   rosuvastatin (CRESTOR) tablet 20 mg, 20 mg, Oral, Daily, Chipper Herb, Ping T, MD, 20 mg at 08/25/23 0925   sodium chloride HYPERTONIC 3 % nebulizer solution 4 mL, 4 mL, Nebulization, BID, Mikey College T, MD, 4 mL at 08/25/23 0950  Current Outpatient Medications:    acetaminophen (TYLENOL) 325 MG tablet, Take 650 mg by mouth every 4 (four) hours as needed., Disp: , Rfl:    albuterol (VENTOLIN HFA) 108 (90 Base) MCG/ACT inhaler, INHALE 2 PUFFS 2-3 TIMES A DAY AS NEEDED FOR WHEEZING (30 DAYS) 30 DAY(S), Disp: 8.5 each, Rfl: 3   apixaban (ELIQUIS) 5 MG TABS tablet, Take 1 tablet (5 mg total) by mouth 2 (two) times daily., Disp: 180 tablet, Rfl: 3   Budeson-Glycopyrrol-Formoterol (BREZTRI AEROSPHERE) 160-9-4.8 MCG/ACT AERO, Inhale 2 puffs into the lungs 2 (two) times daily., Disp: 10.7 g, Rfl: 6   clonazePAM (KLONOPIN) 0.5 MG tablet, TAKE 1 TABLET BY MOUTH AT BEDTIME., Disp: 30 tablet, Rfl: 0   diltiazem (CARDIZEM CD) 120 MG 24 hr capsule, Take 1 capsule (120 mg total) by mouth 2 (two) times daily., Disp: 180 capsule, Rfl: 3   Docusate Calcium (STOOL SOFTENER PO), Take 1 tablet by mouth every evening., Disp: , Rfl:    dofetilide (TIKOSYN) 250 MCG capsule, Take 1 capsule (250 mcg total) by mouth 2 (two) times daily., Disp: 180 capsule, Rfl: 2   finasteride (PROSCAR) 5 MG tablet, Take 5 mg by mouth daily., Disp: , Rfl:    furosemide (LASIX) 20 MG tablet, Take 2 tablets (40 mg total) by mouth  as directed. Take 2 tablets in the morning (40 mg) for 4 days., Disp: 180 tablet, Rfl: 3   gabapentin (NEURONTIN) 100 MG capsule, Take 200 mg by mouth at bedtime., Disp: , Rfl:  ipratropium (ATROVENT) 0.03 % nasal spray, Place 2 sprays into both nostrils every 12 (twelve) hours., Disp: 30 mL, Rfl: 12   ipratropium-albuterol (DUONEB) 0.5-2.5 (3) MG/3ML SOLN, INHALE 3 ML BY NEBULIZER EVERY 6 HOURS AS NEEDED, Disp: 360 mL, Rfl: 1   nitroGLYCERIN (NITROSTAT) 0.4 MG SL tablet, Place 1 tablet (0.4 mg total) under the tongue every 5 (five) minutes as needed for chest pain., Disp: 25 tablet, Rfl: 4   pantoprazole (PROTONIX) 40 MG tablet, Take 1 tablet (40 mg total) by mouth daily., Disp: 90 tablet, Rfl: 3   polyethylene glycol (MIRALAX / GLYCOLAX) 17 g packet, Take 17 g by mouth daily as needed for moderate constipation., Disp: , Rfl:    predniSONE (DELTASONE) 5 MG tablet, Take 2 tablets (10 mg total) by mouth daily with breakfast. (Patient taking differently: Take 7.5 mg by mouth daily with breakfast.), Disp: 60 tablet, Rfl: 11   rosuvastatin (CRESTOR) 20 MG tablet, Take 1 tablet (20 mg total) by mouth daily., Disp: 90 tablet, Rfl: 3   cefpodoxime (VANTIN) 200 MG tablet, Take 1 tablet (200 mg total) by mouth 2 (two) times daily for 7 days. (Patient not taking: Reported on 08/23/2023), Disp: 14 tablet, Rfl: 0   OXYGEN, Inhale into the lungs. 3-4 liters upon exertion and 3 liters continuous at night., Disp: , Rfl:    predniSONE (DELTASONE) 20 MG tablet, Take 2 tablets (40 mg total) by mouth daily. For 3 days, then 1 tab daily for 3 days, Disp: 9 tablet, Rfl: 1   silver sulfADIAZINE (SILVADENE) 1 % cream, Apply 1 Application topically daily., Disp: 50 g, Rfl: 0   sodium chloride HYPERTONIC 3 % nebulizer solution, Take by nebulization in the morning and at bedtime., Disp: 750 mL, Rfl: 12    ALLERGIES   Flagyl [metronidazole]     REVIEW OF SYSTEMS    Review of Systems:  Gen:  Denies  fever, sweats,  chills weigh loss  HEENT: Denies blurred vision, double vision, ear pain, eye pain, hearing loss, nose bleeds, sore throat Cardiac:  No dizziness, chest pain or heaviness, chest tightness,edema Resp:   + cough +sputum porduction, +shortness of breath,+wheezing, -hemoptysis,  Gi: Denies swallowing difficulty, stomach pain, nausea or vomiting, diarrhea, constipation, bowel incontinence Gu:  Denies bladder incontinence, burning urine Ext:   Denies Joint pain, stiffness or swelling Skin: Denies  skin rash, easy bruising or bleeding or hives Endoc:  Denies polyuria, polydipsia , polyphagia or weight change Psych:   Denies depression, insomnia or hallucinations   Other:  All other systems negative   VS: BP 130/61   Pulse 77   Temp 97.8 F (36.6 C) (Oral)   Resp (!) 21   SpO2 100%      PHYSICAL EXAM  General Appearance: +resp distress  EYES PERRLA, EOM intact.   NECK Supple, No JVD Pulmonary: normal breath sounds, + wheezing.  CardiovascularNormal S1,S2.  No m/r/g.   Abdomen: Benign, Soft, non-tender. Skin:   warm, no rashes, no ecchymosis  Extremities: normal, no cyanosis, clubbing. Neuro:without focal findings,  speech normal  PSYCHIATRIC: Mood, affect within normal limits.   ALL OTHER ROS ARE NEGATIVE      IMAGING    DG Chest Portable 1 View Result Date: 08/23/2023 CLINICAL DATA:  Shortness of breath EXAM: PORTABLE CHEST 1 VIEW COMPARISON:  Chest radiograph dated 05/31/2023 FINDINGS: Hyperinflated lungs. Asymmetric lucency of the left upper lung. No focal consolidations. No pleural effusion or pneumothorax. The heart size and mediastinal contours are within  normal limits. No acute osseous abnormality. IMPRESSION: 1. Hyperinflated lungs with asymmetric lucency of the left upper lung, in keeping with emphysematous changes. 2. No focal consolidations. Electronically Signed   By: Agustin Cree M.D.   On: 08/23/2023 14:30   PREVIOUS ECHO EF 65% 2024   ASSESSMENT/PLAN  83 yo  white male with end stage COPD admitted for Acute resp failure with acute COPD exacerbation likely related to acute CAP  SEVERE COPD EXACERBATION -continue IV steroids as prescribed 40 BID -continue NEB THERAPY as prescribed Oxygen as needed biPAP as needed -morphine as needed -wean fio2 as needed and tolerated Lasix therapy Continue IV abx Check ECHO   I spent a total of 65 minutes reviewing chart data, face-to-face evaluation with the patient, counseling and coordination of care as detailed above.  Wife at bedside High risk for intubation     Lucie Leather, M.D.  [facility]  Medical Director [facility] Medical Director [facility] Cardio-Pulmonary Department

## 2023-08-25 NOTE — ED Notes (Signed)
Advised nurse that patient has ready bed

## 2023-08-26 ENCOUNTER — Ambulatory Visit: Payer: Medicare Other | Admitting: Student in an Organized Health Care Education/Training Program

## 2023-08-26 DIAGNOSIS — I5033 Acute on chronic diastolic (congestive) heart failure: Secondary | ICD-10-CM | POA: Diagnosis not present

## 2023-08-26 DIAGNOSIS — J441 Chronic obstructive pulmonary disease with (acute) exacerbation: Secondary | ICD-10-CM | POA: Diagnosis not present

## 2023-08-26 DIAGNOSIS — J9621 Acute and chronic respiratory failure with hypoxia: Secondary | ICD-10-CM | POA: Diagnosis not present

## 2023-08-26 DIAGNOSIS — I480 Paroxysmal atrial fibrillation: Secondary | ICD-10-CM | POA: Diagnosis not present

## 2023-08-26 LAB — BASIC METABOLIC PANEL
Anion gap: 8 (ref 5–15)
BUN: 49 mg/dL — ABNORMAL HIGH (ref 8–23)
CO2: 30 mmol/L (ref 22–32)
Calcium: 8.8 mg/dL — ABNORMAL LOW (ref 8.9–10.3)
Chloride: 98 mmol/L (ref 98–111)
Creatinine, Ser: 1.12 mg/dL (ref 0.61–1.24)
GFR, Estimated: >60 mL/min (ref >60)
Glucose, Bld: 172 mg/dL — ABNORMAL HIGH (ref 70–99)
Potassium: 4.3 mmol/L (ref 3.5–5.1)
Sodium: 136 mmol/L (ref 135–145)

## 2023-08-26 LAB — ECHOCARDIOGRAM COMPLETE
AR max vel: 2.1 cm2
AV Peak grad: 5.1 mm[Hg]
Ao pk vel: 1.13 m/s
Area-P 1/2: 3.77 cm2
Height: 70 in
S' Lateral: 2.6 cm
Weight: 2275.15 [oz_av]

## 2023-08-26 LAB — MAGNESIUM: Magnesium: 2.3 mg/dL (ref 1.7–2.4)

## 2023-08-26 MED ORDER — FUROSEMIDE 40 MG PO TABS
40.0000 mg | ORAL_TABLET | Freq: Every day | ORAL | Status: DC
  Administered 2023-08-27 – 2023-09-01 (×6): 40 mg via ORAL
  Filled 2023-08-26 (×6): qty 1

## 2023-08-26 NOTE — Progress Notes (Signed)
Pharmacy: Dofetilide (Tikosyn) - Follow Up Assessment and Electrolyte Replacement  Pharmacy consulted to assist in monitoring and replacing electrolytes in this 83 y.o. male admitted on 08/23/2023 on dofetilide. Stable home dose resumed, missed doses documented. Qtc < 500.  Labs:    Component Value Date/Time   K 4.3 08/26/2023 0807   MG 2.3 08/26/2023 0807     Plan: Potassium: K >/= 4: No additional supplementation needed  Magnesium: Mg > 2: No additional supplementation needed   [name] [name] PharmD, BCPS 08/26/2023 8:59 AM

## 2023-08-26 NOTE — Progress Notes (Signed)
1/27 Patient under Droplet Precautions, IMM Letter was given to RN assigned to patient to give at bedside.

## 2023-08-26 NOTE — Progress Notes (Signed)
Progress Note   Patient: [name] [name]. ZOX:[id number] DOB: 25-Dec-1940 DOA: 08/23/2023     3 DOS: the patient was seen and examined on 08/26/2023   Brief hospital course: [name] [name]. is a 83 y.o. male with medical history significant of steroid-dependent COPD Gold stage III, chronic hypoxic respiratory failure on 3 to 4 L as needed, HTN, chronic HFpEF, CAD status post stenting in 2019, PAF on Tikosyn and Eliquis presented with worsening of cough wheezing shortness of breath.  Patient is admitted to the hospitalist service for further management evaluation of respiratory failure secondary to acute COPD, CHF exacerbation.  Assessment and Plan: Acute on chronic hypoxic and hypercapnic respiratory failure Acute COPD exacerbation Remained off BiPAP, currently on 3-4 L supplemental oxygen. Continues to have severe respiratory distress.  Pulmonary saw the patient. Encourage incentive spirometry, flutter valve. Continue doxycycline He is on IV Solu-Medrol changed to 40mg  q12. Continue ICS, LAMA and LABA DuoNebs scheduled every 4. Morphine as needed ordered.  Acute on chronic HFpEF decompensation Will transition IV Lasix to oral from tomorrow. His leg swelling much improved. Echo reviewed shows EF 60 to 65%, Monitor daily weights, strict input and output. Continue to monitor renal function, electrolytes.   PAF Continue Tikosyn and Cardizem therapy. Cardiology advised not to stop Tikosyn, QTc not prolonged. Monitor telemetry, EKG as needed. Continue Eliquis therapy.   CAD No chest pain or EKG changes noted. Troponin negative. Continue Eliquis and statin    Out of bed to chair. Incentive spirometry. Nursing supportive care. Fall, aspiration precautions. DVT prophylaxis   Code Status: Full Code  Subjective: Patient is seen and examined today morning.  He feels slightly better than yesterday, continues to be tachypneic. Wife at bedside.  Encouraged him to work with PT,  incentive spirometry.  Physical Exam: Vitals:   08/26/23 1118 08/26/23 1235 08/26/23 1519 08/26/23 1555  BP:  124/66  122/72  Pulse:  70  78  Resp:  (!) 21  20  Temp:  (!) 97.5 F (36.4 C)  (!) 97.4 F (36.3 C)  TempSrc:  Oral  Oral  SpO2: 98% 98% 98% 100%  Weight:      Height:        General - Elderly Caucasian ill appearing male, mild respiratory distress HEENT - PERRLA, EOMI, atraumatic head, non tender sinuses. Lung - Clear, basal rales, diffuse rhonchi, wheezes.  Using accessory muscles Heart - S1, S2 heard, no murmurs, rubs, 1+ pedal edema. Abdomen - Soft, non tender, nondistended, bowel sounds good Neuro - Alert, awake and oriented x 3, non focal exam. Skin - Warm and dry.  Data Reviewed:      Latest Ref Rng & Units 08/24/2023    4:30 AM 08/23/2023    1:25 PM 07/15/2023   11:14 AM  CBC  WBC 4.0 - 10.5 K/uL 5.6  13.5  13.4   Hemoglobin 13.0 - 17.0 g/dL 81.1  91.4  78.2   Hematocrit 39.0 - 52.0 % 36.0  43.0  38.3   Platelets 150 - 400 K/uL 249  304  360.0       Latest Ref Rng & Units 08/26/2023    8:07 AM 08/25/2023    5:27 AM 08/24/2023    4:30 AM  BMP  Glucose 70 - 99 mg/dL 956  213  086   BUN 8 - 23 mg/dL 49  40  26   Creatinine 0.61 - 1.24 mg/dL 5.78  4.69  6.29   Sodium 135 - 145  mmol/L 136  137  139   Potassium 3.5 - 5.1 mmol/L 4.3  3.9  3.6   Chloride 98 - 111 mmol/L 98  96  94   CO2 22 - 32 mmol/L 30  31  31    Calcium 8.9 - 10.3 mg/dL 8.8  8.6  8.4    ECHOCARDIOGRAM COMPLETE Result Date: 08/26/2023    ECHOCARDIOGRAM REPORT   Patient Name:   [name] [name]. Date of Exam: 08/25/2023 Medical Rec #:  [id number]            Height:       70.5 in Accession #:    [id number]           Weight:       155.0 lb Date of Birth:  Oct 29, 1940            BSA:          1.883 m Patient Age:    82 years             BP:           139/77 mmHg Patient Gender: M                    HR:           90 bpm. Exam Location:  [facility] Procedure: 2D Echo, Cardiac Doppler and Color Doppler  Indications:     Acytre respiratory distress R06.03  History:         Patient has prior history of Echocardiogram examinations, most                  recent 08/07/2022. CAD, COPD, Arrythmias:Atrial Fibrillation and                  Atrial Flutter; Risk Factors:Hypertension and Dyslipidemia.  Sonographer:     Lucendia Herrlich RCS Referring Phys:  [id number] [name] Diagnosing Phys: Chilton Si MD  Sonographer Comments: Image acquisition challenging due to respiratory motion. IMPRESSIONS  1. Left ventricular ejection fraction, by estimation, is 60 to 65%. The left ventricle has normal function. The left ventricle has no regional wall motion abnormalities. Left ventricular diastolic parameters are indeterminate.  2. Right ventricular systolic function is normal. The right ventricular size is normal. There is mildly elevated pulmonary artery systolic pressure.  3. The mitral valve is normal in structure. No evidence of mitral valve regurgitation. No evidence of mitral stenosis.  4. The aortic valve is tricuspid. Aortic valve regurgitation is not visualized. No aortic stenosis is present.  5. The inferior vena cava is normal in size with greater than 50% respiratory variability, suggesting right atrial pressure of 3 mmHg. FINDINGS  Left Ventricle: Left ventricular ejection fraction, by estimation, is 60 to 65%. The left ventricle has normal function. The left ventricle has no regional wall motion abnormalities. The left ventricular internal cavity size was normal in size. There is  no left ventricular hypertrophy. Left ventricular diastolic parameters are indeterminate. Indeterminate filling pressures. Right Ventricle: The right ventricular size is normal. No increase in right ventricular wall thickness. Right ventricular systolic function is normal. There is mildly elevated pulmonary artery systolic pressure. The tricuspid regurgitant velocity is 2.97  m/s, and with an assumed right atrial pressure of 3  mmHg, the estimated right ventricular systolic pressure is 38.3 mmHg. Left Atrium: Left atrial size was normal in size. Right Atrium: Right atrial size was normal in size. Pericardium: There is no evidence of pericardial effusion. Mitral Valve:  The mitral valve is normal in structure. No evidence of mitral valve regurgitation. No evidence of mitral valve stenosis. Tricuspid Valve: The tricuspid valve is normal in structure. Tricuspid valve regurgitation is trivial. No evidence of tricuspid stenosis. Aortic Valve: The aortic valve is tricuspid. Aortic valve regurgitation is not visualized. No aortic stenosis is present. Aortic valve peak gradient measures 5.1 mmHg. Pulmonic Valve: The pulmonic valve was normal in structure. Pulmonic valve regurgitation is not visualized. No evidence of pulmonic stenosis. Aorta: The aortic root is normal in size and structure. Venous: The inferior vena cava is normal in size with greater than 50% respiratory variability, suggesting right atrial pressure of 3 mmHg. IAS/Shunts: No atrial level shunt detected by color flow Doppler.  LEFT VENTRICLE PLAX 2D LVIDd:         4.20 cm   Diastology LVIDs:         2.60 cm   LV e' medial:    9.14 cm/s LV PW:         1.10 cm   LV E/e' medial:  10.9 LV IVS:        0.80 cm   LV e' lateral:   8.27 cm/s LVOT diam:     2.00 cm   LV E/e' lateral: 12.1 LV SV:         53 LV SV Index:   28 LVOT Area:     3.14 cm  RIGHT VENTRICLE            IVC RV S prime:     9.79 cm/s  IVC diam: 1.40 cm TAPSE (M-mode): 2.5 cm LEFT ATRIUM         Index LA diam:    3.10 cm 1.65 cm/m  AORTIC VALVE AV Area (Vmax): 2.10 cm AV Vmax:        113.00 cm/s AV Peak Grad:   5.1 mmHg LVOT Vmax:      75.63 cm/s LVOT Vmean:     49.867 cm/s LVOT VTI:       0.168 m  AORTA Ao Root diam: 3.20 cm Ao Asc diam:  3.10 cm MITRAL VALVE                TRICUSPID VALVE MV Area (PHT): 3.77 cm     TR Peak grad:   35.3 mmHg MV Decel Time: 201 msec     TR Vmax:        297.00 cm/s MV E velocity: 100.00  cm/s MV A velocity: 99.00 cm/s   SHUNTS MV E/A ratio:  1.01         Systemic VTI:  0.17 m                             Systemic Diam: 2.00 cm Chilton Si MD Electronically signed by Chilton Si MD Signature Date/Time: 08/26/2023/7:51:48 AM    Final    Family Communication: Discussed with patient, wife at bedside, they  understand and agree. All questions answereed.  Disposition: Status is: Inpatient Remains inpatient appropriate because: hypoxia, respiratory distress, copd  Planned Discharge Destination: Home with Home Health     MDM level 3-patient is severely short of breath, has severe COPD, CHF.  Patient is on IV steroids, IV Lasix therapy, nebulizers, supplemental oxygen.  Pulmonologist evaluated the patient, patient is at high risk for sudden clinical deterioration.  Author: [name], MD 08/26/2023 5:00 PM Secure chat 7am to 7pm For on call review [email or web address].

## 2023-08-26 NOTE — Care Management Important Message (Signed)
Important Message  Patient Details  Name: [name] [name]. MRN: [id number] Date of Birth: October 10, 1940   Important Message Given:  Yes - Medicare IM     Sherilyn Banker 08/26/2023, 11:29 AM

## 2023-08-26 NOTE — Evaluation (Signed)
Physical Therapy Evaluation Patient Details Name: [name] [name]. MRN: [id number] DOB: Jan 19, 1941 Today's Date: 08/26/2023  History of Present Illness  Pt is an 83 y.o. male presenting to hospital 08/23/23 with c/o worsening SOB.  Pt admitted with acute on chronic hypoxic and hypercapnic respiratory failure, acute COPD exacerbation, acute on chronic HFpEF decompensation, PAF.  (+) Influenza.  PMH includes COPD on home O2, pulmonary nodular disease, CAD, htn, HLD, a-fib, Tikosyn, and GERD.  Clinical Impression  Prior to recent medical concerns, pt used transport chair within home (propelled with LE's or his wife pushed him); used Art gallery manager in community; and used rollator to walk at physical therapy (3x's per week); lives with his wife.  No c/o pain during session.  Currently pt is modified independent with bed mobility; CGA with transfer from bed; and CGA to take a couple small side steps to R along bed with RW use.  Limited activity d/t increased SOB and work of breathing with activities requiring pacing and rest breaks; pt required extended rest break in bed end of session to improve breathing.  SpO2 sats (on 4.5 L O2 via nasal cannula) 93% or greater during sessions activities.  Pt would currently benefit from skilled PT to address noted impairments and functional limitations (see below for any additional details).  Upon hospital discharge, pt would benefit from ongoing therapy.     If plan is discharge home, recommend the following: A little help with walking and/or transfers;A little help with bathing/dressing/bathroom;Assistance with cooking/housework;Assist for transportation;Help with stairs or ramp for entrance   Can travel by private vehicle        Equipment Recommendations None recommended by PT (Pt has needed DME at home)  Recommendations for Other Services       Functional Status Assessment Patient has had a recent decline in their functional status and demonstrates the  ability to make significant improvements in function in a reasonable and predictable amount of time.     Precautions / Restrictions Precautions Precautions: Fall Restrictions Weight Bearing Restrictions Per Provider Order: No      Mobility  Bed Mobility Overal bed mobility: Needs Assistance Bed Mobility: Supine to Sit, Sit to Supine     Supine to sit: Modified independent (Device/Increase time), HOB elevated Sit to supine: Modified independent (Device/Increase time), HOB elevated        Transfers Overall transfer level: Needs assistance Equipment used: Rolling walker (2 wheels) Transfers: Sit to/from Stand Sit to Stand: Contact guard assist           General transfer comment: vc's for UE placement; increased effort to stand up to RW    Ambulation/Gait Ambulation/Gait assistance: Contact guard assist Gait Distance (Feet):  (side step to R along bed a couple small steps) Assistive device: Rolling walker (2 wheels)   Gait velocity: decreased     General Gait Details: limited d/t SOB/increased WOB  Stairs            Wheelchair Mobility     Tilt Bed    Modified Rankin (Stroke Patients Only)       Balance Overall balance assessment: Needs assistance Sitting-balance support: No upper extremity supported, Feet supported Sitting balance-[name]: Good Sitting balance - Comments: steady reaching within BOS   Standing balance support: Bilateral upper extremity supported, Reliant on assistive device for balance Standing balance-[name]: Fair Standing balance comment: steady static standing with B UE support on RW  Pertinent Vitals/Pain Pain Assessment Pain Assessment: No/denies pain HR 86-107 bpm during sessions activities.    Home Living Family/patient expects to be discharged to:: Private residence Living Arrangements: Spouse/significant other Available Help at Discharge: Family;Available  PRN/intermittently Type of Home: Independent living facility Home Access: Level entry       Home Layout: One level Home Equipment: Rollator (4 wheels);Shower seat;Transport chair;Electric scooter Additional Comments: Agricultural consultant.    Prior Function Prior Level of Function : Driving;Needs assist             Mobility Comments: Uses transport chair in home (uses LE's to propel self or his wife pushes him); uses electric scooter in community; goes to PT 3x/week and uses rollator to walk.  Uses 3 L supplemental O2 at rest and 4 L with activity.  Goes to gym 2 days/week. ADLs Comments: Pt reports having aide come 2x/wk for bathing assist.     Extremity/Trunk Assessment   Upper Extremity Assessment Upper Extremity Assessment: Overall WFL for tasks assessed    Lower Extremity Assessment Lower Extremity Assessment: Generalized weakness    Cervical / Trunk Assessment Cervical / Trunk Assessment: Normal  Communication   Communication Communication: No apparent difficulties Cueing Techniques: Verbal cues  Cognition Arousal: Alert Behavior During Therapy: WFL for tasks assessed/performed Overall Cognitive Status: Within Functional Limits for tasks assessed                                          General Comments General comments (skin integrity, edema, etc.): Pt reports having fragile skin.  Pt reporting L lower leg skin tear from bed--nurse notified immediately and came to address.    Exercises     Assessment/Plan    PT Assessment Patient needs continued PT services  PT Problem List Decreased strength;Decreased activity tolerance;Decreased balance;Decreased mobility;Cardiopulmonary status limiting activity;Decreased skin integrity       PT Treatment Interventions DME instruction;Gait training;Functional mobility training;Therapeutic activities;Therapeutic exercise;Balance training;Patient/family education    PT Goals (Current goals can be found  in the Care Plan section)  Acute Rehab PT Goals Patient Stated Goal: to improve breathing and walking PT Goal Formulation: With patient Time For Goal Achievement: 09/09/23 Potential to Achieve Goals: Fair    Frequency Min 1X/week     Co-evaluation               AM-PAC PT "6 Clicks" Mobility  Outcome Measure Help needed turning from your back to your side while in a flat bed without using bedrails?: None Help needed moving from lying on your back to sitting on the side of a flat bed without using bedrails?: None Help needed moving to and from a bed to a chair (including a wheelchair)?: A Little Help needed standing up from a chair using your arms (e.g., wheelchair or bedside chair)?: A Little Help needed to walk in hospital room?: A Little Help needed climbing 3-5 steps with a railing? : A Lot 6 Click Score: 19    End of Session Equipment Utilized During Treatment: Gait belt Activity Tolerance: Patient tolerated treatment well Patient left: in bed;with call bell/phone within reach;with bed alarm set;Other (comment) (B LE's elevated via pillows) Nurse Communication: Mobility status;Precautions; Pt's breathing concerns PT Visit Diagnosis: Other abnormalities of gait and mobility (R26.89);Muscle weakness (generalized) (M62.81)    Time: 4098-1191 PT Time Calculation (min) (ACUTE ONLY): 51 min   Charges:   PT  Evaluation $PT Eval Low Complexity: 1 Low PT Treatments $Therapeutic Activity: 23-37 mins PT General Charges $$ ACUTE PT VISIT: 1 Visit         Hendricks Limes, PT 08/26/23, 7:32 PM

## 2023-08-27 DIAGNOSIS — I5033 Acute on chronic diastolic (congestive) heart failure: Secondary | ICD-10-CM | POA: Diagnosis not present

## 2023-08-27 DIAGNOSIS — Z515 Encounter for palliative care: Secondary | ICD-10-CM

## 2023-08-27 DIAGNOSIS — J9601 Acute respiratory failure with hypoxia: Secondary | ICD-10-CM

## 2023-08-27 DIAGNOSIS — J9621 Acute and chronic respiratory failure with hypoxia: Secondary | ICD-10-CM | POA: Diagnosis not present

## 2023-08-27 DIAGNOSIS — J9602 Acute respiratory failure with hypercapnia: Secondary | ICD-10-CM | POA: Diagnosis not present

## 2023-08-27 DIAGNOSIS — I509 Heart failure, unspecified: Secondary | ICD-10-CM | POA: Diagnosis not present

## 2023-08-27 DIAGNOSIS — I480 Paroxysmal atrial fibrillation: Secondary | ICD-10-CM | POA: Diagnosis not present

## 2023-08-27 DIAGNOSIS — J441 Chronic obstructive pulmonary disease with (acute) exacerbation: Secondary | ICD-10-CM | POA: Diagnosis not present

## 2023-08-27 MED ORDER — DOCUSATE SODIUM 100 MG PO CAPS
100.0000 mg | ORAL_CAPSULE | Freq: Two times a day (BID) | ORAL | Status: DC
  Administered 2023-08-27 – 2023-08-31 (×8): 100 mg via ORAL
  Filled 2023-08-27 (×9): qty 1

## 2023-08-27 MED ORDER — REVEFENACIN 175 MCG/3ML IN SOLN
175.0000 ug | Freq: Every day | RESPIRATORY_TRACT | Status: DC
  Administered 2023-08-27 – 2023-09-01 (×5): 175 ug via RESPIRATORY_TRACT
  Filled 2023-08-27 (×5): qty 3

## 2023-08-27 MED ORDER — SENNA 8.6 MG PO TABS
2.0000 | ORAL_TABLET | Freq: Every day | ORAL | Status: DC
  Administered 2023-08-27 – 2023-08-31 (×5): 17.2 mg via ORAL
  Filled 2023-08-27 (×5): qty 2

## 2023-08-27 MED ORDER — POLYETHYLENE GLYCOL 3350 17 G PO PACK
17.0000 g | PACK | Freq: Every day | ORAL | Status: DC
  Administered 2023-08-28 – 2023-08-29 (×2): 17 g via ORAL
  Filled 2023-08-27 (×5): qty 1

## 2023-08-27 MED ORDER — ALBUTEROL SULFATE (2.5 MG/3ML) 0.083% IN NEBU
2.5000 mg | INHALATION_SOLUTION | RESPIRATORY_TRACT | Status: DC | PRN
  Administered 2023-08-28: 2.5 mg via RESPIRATORY_TRACT
  Filled 2023-08-27: qty 3

## 2023-08-27 MED ORDER — PREDNISONE 20 MG PO TABS
40.0000 mg | ORAL_TABLET | Freq: Every day | ORAL | Status: AC
  Administered 2023-08-28 – 2023-09-01 (×5): 40 mg via ORAL
  Filled 2023-08-27 (×5): qty 2

## 2023-08-27 MED ORDER — ARFORMOTEROL TARTRATE 15 MCG/2ML IN NEBU
15.0000 ug | INHALATION_SOLUTION | Freq: Two times a day (BID) | RESPIRATORY_TRACT | Status: DC
  Administered 2023-08-27 – 2023-09-01 (×11): 15 ug via RESPIRATORY_TRACT
  Filled 2023-08-27 (×12): qty 2

## 2023-08-27 NOTE — Consult Note (Addendum)
Consultation Note Date: 08/27/2023 at 1100  Patient Name: [name] [name].  DOB: 07-25-1941  MRN: [id number]  Age / Sex: 83 y.o., male  PCP: [name], MD Referring Physician: [name], MD  HPI/Patient Profile: 83 y.o. male  with past medical history of steroid-dependent COPD Gold stage III, chronic hypoxic respiratory failure on 3 to 4 L as needed, HTN, chronic HFpEF, CAD status post stenting in 2019, PAF on Tikosyn and Eliquis  admitted on 08/23/2023 with shortness of breath, wheezing, and worsening of cough.  Patient is being treated for acute respiratory failure secondary to acute COPD exacerbation and CHF exacerbation.  1/26, patient found to be Influenza A positive.   PMT was consulted to discuss goals of care.  Clinical Assessment and Goals of Care: Extensive chart review completed prior to meeting patient including labs, vital signs, imaging, progress notes, orders, and available advanced directive documents from current and previous encounters. I then met with patient at bedside to discuss diagnosis prognosis, GOC, EOL wishes, disposition and options.  I introduced Palliative Medicine as specialized medical care for people living with serious illness. It focuses on providing relief from the symptoms and stress of a serious illness. The goal is to improve quality of life for both the patient and the family.  Patient shares knowledge of palliative medicine is and appreciated of my visit.  RN and Runner, broadcasting/film/video are at bedside attempting to place IV in right arm.  Patient's unable to fully participate in discussions at this time.  He is in agreement for me to return at a later date and time to continue goals of care discussions.  During my visit, symptoms assessed.  Patient has no acute complaints of pain, discomfort, chest pain, headache, or other acute issues at this time.  No  adjustment to [facility] needed.   In review of patient's advance care planning documents available in ACP tab of epic, patient has a MOST form that reflects full code and full scope with only limitations  being no feeding tube for an indefinite amount of time and that he would only be accepting of CPR if his arrest is witnessed.  Further clarification of patient's goals needed but unable to do so today.   I plan to return bedside tomorrow to continue goals of care discussions.  Full code and full scope remain.  Primary Decision Maker PATIENT  Physical Exam Vitals reviewed.  Constitutional:      General: He is not in acute distress.    Appearance: He is normal weight. He is not ill-appearing.  HENT:     Head: Normocephalic.  Eyes:     Pupils: Pupils are equal, round, and reactive to light.  Cardiovascular:     Rate and Rhythm: Normal rate.  Pulmonary:     Effort: No respiratory distress.     Comments: [place] in place Musculoskeletal:     Comments: MAETC  Skin:    General: Skin is warm and dry.  Neurological:     Mental Status: He  is alert and oriented to person, place, and time.  Psychiatric:        Mood and Affect: Mood is not anxious.        Behavior: Behavior is not agitated.     Palliative Assessment/Data: 60%     Thank you for this consult. Palliative medicine will continue to follow and assist holistically.   Time Total: 40 minutes  Time spent includes: Detailed review of medical records (labs, imaging, vital signs), medically appropriate exam (mental status, respiratory, cardiac, skin), discussed with treatment team, counseling and educating patient, family and staff, documenting clinical information, medication management and coordination of care.  Signed by: Georgiann Cocker, DNP, FNP-BC Palliative Medicine   Please contact Palliative Medicine Team providers via [facility] for questions and concerns.

## 2023-08-27 NOTE — Progress Notes (Signed)
NAME:  [name] Repsher., MRN:  [id number], DOB:  [birth year], LOS: 4 ADMISSION DATE:  08/23/2023, CONSULTATION DATE:  08/25/2023 by Dr. Eugenia Mcalpine MD: [name], MD CHIEF COMPLAINT: Follow-up requested, exacerbation of COPD  History of Present Illness:  83 y.o. male with medical history significant of steroid-dependent COPD Gold stage III, chronic hypoxic respiratory failure on 3 to 4 L as needed, HTN, chronic HFpEF, CAD status post stenting in 2019, PAF on Tikosyn and Eliquis    Presented with worsening of cough wheezing shortness of breath.  Patient is admitted to the hospitalist service for further management evaluation of respiratory failure secondary to acute COPD, CHF exacerbation.    Patient placed on biPAP, weaned off at this time Remains very SOB, using accessory muscles to breathe   PCCM asked to see patient  Pertinent  Medical History  COPD GOLD 3E (suspect now GOLD 4) last PFTs 2019 Excessive dynamic airway collapse (EDAC) Chronic steroid-dependent Chronic respiratory failure with hypoxia on supplemental O2 Chronic heart failure with preserved ejection fraction Paroxysmal atrial fibrillation on Tikosyn and Eliquis  Interim History / Subjective:  Continues to be very breathless.  Asking if it is time to enter in hospice.  Difficulty clearing secretions.  Has not used flutter valve this hospitalization.  Objective   Blood pressure (!) 142/70, pulse 68, temperature 98 F (36.7 C), temperature source Oral, resp. rate 14, height 5\' 10"  (1.778 m), weight 64.5 kg, SpO2 99%.    SpO2: 99 % O2 Flow Rate (L/min): 4 L/min FiO2 (%): 36 %    Intake/Output Summary (Last 24 hours) at 08/27/2023 0910 Last data filed at 08/27/2023 0130 Gross per 24 hour  Intake 740 ml  Output 1200 ml  Net -460 ml   Filed Weights   08/25/23 1616  Weight: 64.5 kg   Examination: GENERAL: Well-developed, chronically ill-appearing, debilitated gentleman lying in bed using pursed lip  breathing.  Supplemental oxygen in place. HEAD: Normocephalic, atraumatic.  EYES: Pupils equal, round, reactive to light.  No scleral icterus.  MOUTH: Multiple missing teeth, oral mucosa moist.  No thrush. NECK: Supple. No thyromegaly. Trachea midline. No JVD.  No adenopathy. PULMONARY: Poor air entry bilaterally.  A faint end expiratory wheezing, I E ratio 1:4.  Positive Hoover's sign. CARDIOVASCULAR: S1 and S2. Regular rate and rhythm.  ABDOMEN: Protuberant, mild thoracoabdominal asynchrony, cards normal. MUSCULOSKELETAL: No joint deformity, no clubbing, no edema.  Significant sarcopenia noted. NEUROLOGIC: Very debilitated with no overt focal deficit noted. SKIN: Intact,warm,dry. PSYCH: Flat affect, cooperative.  Assessment & Plan:  Acute on chronic hypoxic respiratory failure D/T severe COPD exacerbation,D/T influenza AH1 Hx: GOLD 3 severe COPD Continue oxygen supplementation to maintain sats between 88 to 92%. Continue bronchodilators Optimized with Brovana/Pulmicort and Yupelri Continue as needed albuterol Pulmonary hygiene with flutter valve May discontinue antibiotics Continue steroids due to patient's chronic steroid dependence Recommend evaluation by Palliative Care  Acute on chronic heart failure with preserved ejection fraction Decompensation likely due to increased work of breathing Management per hospitalist team Patient on diuretics Monitor renal function and electrolytes Supplement electrolytes as needed  Paroxysmal atrial fibrillation Patient on Tikosyn which limits choice of antibiotics as needed Continue to monitor Continue Eliquis  End-of-life discussion Patient interested in palliative care consultation  Labs   CBC: Recent Labs  Lab 08/23/23 1325 08/24/23 0430  WBC 13.5* 5.6  NEUTROABS 12.2*  --   HGB 13.6 11.8*  HCT 43.0 36.0*  MCV 96.6 93.0  PLT 304 249  Basic Metabolic Panel: Recent Labs  Lab 08/23/23 1325 08/24/23 0430  08/25/23 0527 08/26/23 0807  NA 142 139 137 136  K 4.0 3.6 3.9 4.3  CL 95* 94* 96* 98  CO2 36* 31 31 30   GLUCOSE 147* 162* 179* 172*  BUN 23 26* 40* 49*  CREATININE 0.93 1.00 1.10 1.12  CALCIUM 9.1 8.4* 8.6* 8.8*  MG  --  2.5* 2.4 2.3   GFR: Estimated Creatinine Clearance: 46.4 mL/min (by C-G formula based on SCr of 1.12 mg/dL). Recent Labs  Lab 08/23/23 1325 08/23/23 2010 08/24/23 0430  PROCALCITON <0.10  --   --   WBC 13.5*  --  5.6  LATICACIDVEN 1.2 1.3  --     Liver Function Tests: Recent Labs  Lab 08/23/23 1325  AST 25  ALT 18  ALKPHOS 61  BILITOT 0.4  PROT 6.9  ALBUMIN 3.6   No results for input(s): "LIPASE", "AMYLASE" in the last 168 hours. No results for input(s): "AMMONIA" in the last 168 hours.  ABG    Component Value Date/Time   HCO3 45.2 (H) 08/23/2023 1325   O2SAT 52.4 08/23/2023 1325     Coagulation Profile: No results for input(s): "INR", "PROTIME" in the last 168 hours.  Cardiac Enzymes: No results for input(s): "CKTOTAL", "CKMB", "CKMBINDEX", "TROPONINI" in the last 168 hours.  HbA1C: Hgb A1c MFr Bld  Date/Time Value Ref Range Status  07/15/2023 11:14 AM 4.8 4.6 - 6.5 % Final    Comment:    Glycemic Control Guidelines for People with Diabetes:Non Diabetic:  <6%Goal of Therapy: <7%Additional Action Suggested:  >8%   08/13/2022 01:29 AM 5.0 4.8 - 5.6 % Final    Comment:    (NOTE) Pre diabetes:          5.7%-6.4%  Diabetes:              >6.4%  Glycemic control for   <7.0% adults with diabetes     CBG: No results for input(s): "GLUCAP" in the last 168 hours.  Review of Systems:   A 10 point review of systems was performed and it is as noted above otherwise negative.  Past Medical History:  He,  has a past medical history of Abdominal discomfort (12/07/2020), Acquired trigger finger of right middle finger (02/04/2020), Acute prostatitis (04/08/2018), Alcohol abuse, Alcohol dependence (HCC) (12/07/2020), Anal or rectal pain  (04/08/2018), Annual physical exam (12/07/2020), Anorectal disorder (12/07/2020), Arthritis, Atherosclerotic heart disease of native coronary artery without angina pectoris (04/08/2018), Atrial flutter (HCC), Benign prostatic hyperplasia with lower urinary tract symptoms (12/07/2020), BPH with elevated PSA, Cancer (HCC), Carotid arterial disease (HCC), Chronic obstructive pulmonary disease with (acute) exacerbation (HCC) (12/07/2020), Chronic respiratory failure with hypoxia (HCC) (08/10/2019), Chronic sinusitis (12/07/2020), Cigarette nicotine dependence, uncomplicated (04/09/2018), Coagulation disorder (HCC) (01/13/2019), 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), 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), Contracture of palmar fascia (12/07/2020), COPD (chronic obstructive pulmonary disease) (HCC), Corn of toe (12/07/2020), Corns and callosities (04/09/2018), Coronary artery disease, Coronary atherosclerosis (05/07/2018), Degenerative disc disease, 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), Degenerative disc disease, lumbar (10/02/2021), Diarrhea (04/09/2018), Double vision with both eyes open (12/07/2020), 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), Dysphagia (08/14/2018), Dyspnea, ED (erectile dysfunction), ED (erectile dysfunction) of organic origin (12/07/2020), Elevated prostate specific antigen (PSA) (04/08/2018), Elevated PSA, Encounter for screening for other disorder (12/07/2020), Enlarged prostate without lower urinary tract symptoms (luts) (04/09/2018), Enthesopathy (12/07/2020), Essential (primary) hypertension (04/08/2018), Ex-smoker (04/09/2018), Exertional dyspnea (04/02/2018), 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), Flatulence (04/08/2018), Gastro-esophageal reflux disease without esophagitis (04/08/2018), GERD (gastroesophageal reflux disease),  History of acute otitis externa (08/14/2018), History of echocardiogram, Hyperlipidemia, Hypertension, Hypoxemia (12/07/2020), Kidney cysts, Left knee pain  (12/07/2020), Low back pain (04/08/2018), Male erectile dysfunction (04/09/2018), Mixed hyperlipidemia (04/08/2018), Nocturia more than twice per night (04/09/2018), Osteoarthritis of first carpometacarpal joint (04/08/2018), Osteoarthritis of knee (12/07/2020), Other long term (current) drug therapy (12/07/2020), Pain due to onychomycosis of toenail of left foot (07/20/2019), Pain in joint (04/09/2018), Pain in knee (04/09/2018), Pain in unspecified knee (12/07/2020), Pain of finger (04/09/2018), Palpitations, 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HCC) (12/07/2020), Personal history of colonic polyps (12/07/2020), Pneumonia, Pneumothorax (04/12/2020), Polypharmacy (04/09/2018), Porokeratosis (01/13/2019), 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HCC), 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   Surgical History:   Past Surgical History:  Procedure Laterality Date   BRONCHIAL BIOPSY  10/13/2019   Procedure: BRONCHIAL BIOPSIES;  Surgeon: Leslye Peer, MD;  Location: [facility] ENDOSCOPY;  Service: Pulmonary;;   BRONCHIAL BIOPSY  04/12/2020   Procedure: BRONCHIAL BIOPSIES;  Surgeon: Leslye Peer, MD;  Location: [facility] ENDOSCOPY;  Service: Pulmonary;;   BRONCHIAL BRUSHINGS  10/13/2019   Procedure: BRONCHIAL BRUSHINGS;  Surgeon: Leslye Peer, MD;  Location: [facility] ENDOSCOPY;  Service: Pulmonary;;   BRONCHIAL BRUSHINGS  04/12/2020   Procedure: BRONCHIAL BRUSHINGS;  Surgeon: Leslye Peer, MD;   Location: [facility] ENDOSCOPY;  Service: Pulmonary;;   BRONCHIAL NEEDLE ASPIRATION BIOPSY  10/13/2019   Procedure: BRONCHIAL NEEDLE ASPIRATION BIOPSIES;  Surgeon: Leslye Peer, MD;  Location: [facility] ENDOSCOPY;  Service: Pulmonary;;   BRONCHIAL NEEDLE ASPIRATION BIOPSY  04/12/2020   Procedure: BRONCHIAL NEEDLE ASPIRATION BIOPSIES;  Surgeon: Leslye Peer, MD;  Location: [facility] ENDOSCOPY;  Service: Pulmonary;;   BRONCHIAL WASHINGS  10/13/2019   Procedure: BRONCHIAL WASHINGS;  Surgeon: Leslye Peer, MD;  Location: [facility] ENDOSCOPY;  Service: Pulmonary;;   BRONCHIAL WASHINGS  04/12/2020   Procedure: BRONCHIAL WASHINGS;  Surgeon: Leslye Peer, MD;  Location: [facility] ENDOSCOPY;  Service: Pulmonary;;   CARDIAC CATHETERIZATION  2002   50% RCA   CARDIOVERSION N/A 09/17/2022   Procedure: CARDIOVERSION;  Surgeon: Iran Ouch, MD;  Location: [facility] ORS;  Service: Cardiovascular;  Laterality: N/A;   CATARACT EXTRACTION, BILATERAL     CORONARY STENT INTERVENTION N/A 04/15/2018   Procedure: CORONARY STENT INTERVENTION;  Surgeon: Corky Crafts, MD;  Location: [facility] INVASIVE CV LAB;  Service: Cardiovascular;  Laterality: N/A;  om1   EYE SURGERY     KNEE ARTHROSCOPY     LEFT HEART CATH AND CORONARY ANGIOGRAPHY N/A 04/15/2018   Procedure: LEFT HEART CATH AND CORONARY ANGIOGRAPHY;  Surgeon: Corky Crafts, MD;  Location: [facility] INVASIVE CV LAB;  Service: Cardiovascular;  Laterality: N/A;   MULTIPLE TOOTH EXTRACTIONS     TONSILLECTOMY     VIDEO BRONCHOSCOPY  04/12/2020   VIDEO BRONCHOSCOPY WITH ENDOBRONCHIAL NAVIGATION N/A 07/26/2016   Procedure: VIDEO BRONCHOSCOPY WITH ENDOBRONCHIAL NAVIGATION;  Surgeon: Leslye Peer, MD;  Location: [facility] OR;  Service: Thoracic;  Laterality: N/A;   VIDEO BRONCHOSCOPY WITH ENDOBRONCHIAL NAVIGATION N/A 10/13/2019   Procedure: [facility] AND VIDEO BRONCHOSCOPY WITH ENDOBRONCHIAL NAVIGATION;  Surgeon: Leslye Peer, MD;  Location: [facility] ENDOSCOPY;  Service: Pulmonary;  Laterality: N/A;   VIDEO  BRONCHOSCOPY  WITH ENDOBRONCHIAL NAVIGATION N/A 04/12/2020   Procedure: VIDEO BRONCHOSCOPY WITH ENDOBRONCHIAL NAVIGATION;  Surgeon: Leslye Peer, MD;  Location: [facility] ENDOSCOPY;  Service: Pulmonary;  Laterality: N/A;     Social History:   reports that he quit smoking about 3 years ago. His smoking use included cigarettes. He started smoking about 71 years ago. He has a 51 pack-year smoking history. He has never been exposed to tobacco smoke. He has never used smokeless tobacco. He reports that he does not drink alcohol and does not use drugs.   Family History:  His family history includes Alzheimer's disease in his mother; Hypertension in his mother.   Allergies Allergies  Allergen Reactions   Flagyl [Metronidazole] Other (See Comments)    Other reaction(s): skin reaction     Home Medications  Prior to Admission medications   Medication Sig Start Date End Date Taking? Authorizing Provider  acetaminophen (TYLENOL) 325 MG tablet Take 650 mg by mouth every 4 (four) hours as needed.   Yes [provider]  albuterol (VENTOLIN HFA) 108 (90 Base) MCG/ACT inhaler INHALE 2 PUFFS 2-3 TIMES A DAY AS NEEDED FOR WHEEZING (30 DAYS) 30 DAY(S) 07/01/23  Yes [name], MD  apixaban (ELIQUIS) 5 MG TABS tablet Take 1 tablet (5 mg total) by mouth 2 (two) times daily. 08/01/23  Yes [name], MD  Budeson-Glycopyrrol-Formoterol (BREZTRI AEROSPHERE) 160-9-4.8 MCG/ACT AERO Inhale 2 puffs into the lungs 2 (two) times daily. 08/01/23  Yes [name], MD  clonazePAM (KLONOPIN) 0.5 MG tablet TAKE 1 TABLET BY MOUTH AT BEDTIME. 08/02/23  Yes [name], MD  diltiazem (CARDIZEM CD) 120 MG 24 hr capsule Take 1 capsule (120 mg total) by mouth 2 (two) times daily. 08/30/22  Yes Furth, Cadence H, PA-C  Docusate Calcium (STOOL SOFTENER PO) Take 1 tablet by mouth every evening.   Yes [provider]  dofetilide (TIKOSYN) 250 MCG capsule Take 1 capsule (250 mcg total) by mouth 2 (two) times  daily. 03/21/23  Yes Sherie Don, NP  finasteride (PROSCAR) 5 MG tablet Take 5 mg by mouth daily.   Yes [provider]  furosemide (LASIX) 20 MG tablet Take 2 tablets (40 mg total) by mouth as directed. Take 2 tablets in the morning (40 mg) for 4 days. 07/12/23  Yes Sherie Don, NP  gabapentin (NEURONTIN) 100 MG capsule Take 200 mg by mouth at bedtime. 07/26/21  Yes [provider]  ipratropium (ATROVENT) 0.03 % nasal spray Place 2 sprays into both nostrils every 12 (twelve) hours. 10/19/22  Yes Leslye Peer, MD  ipratropium-albuterol (DUONEB) 0.5-2.5 (3) MG/3ML SOLN INHALE 3 ML BY NEBULIZER EVERY 6 HOURS AS NEEDED 11/29/22  Yes Byrum, Les Pou, MD  nitroGLYCERIN (NITROSTAT) 0.4 MG SL tablet Place 1 tablet (0.4 mg total) under the tongue every 5 (five) minutes as needed for chest pain. 07/13/21  Yes Revankar, Aundra Dubin, MD  pantoprazole (PROTONIX) 40 MG tablet Take 1 tablet (40 mg total) by mouth daily. 03/13/23  Yes [name], MD  polyethylene glycol (MIRALAX / GLYCOLAX) 17 g packet Take 17 g by mouth daily as needed for moderate constipation.   Yes [provider]  predniSONE (DELTASONE) 5 MG tablet Take 2 tablets (10 mg total) by mouth daily with breakfast. Patient taking differently: Take 7.5 mg by mouth daily with breakfast. 06/26/23 06/25/24 Yes Dgayli, Lianne Bushy, MD  rosuvastatin (CRESTOR) 20 MG tablet Take 1 tablet (20 mg total) by mouth daily. 04/16/23  Yes Riddle,  Luella Cook, NP  cefpodoxime (VANTIN) 200 MG tablet Take 1 tablet (200 mg total) by mouth 2 (two) times daily for 7 days. Patient not taking: Reported on 08/23/2023 08/22/23 08/29/23  Raechel Chute, MD  OXYGEN Inhale into the lungs. 3-4 liters upon exertion and 3 liters continuous at night.    [provider]  predniSONE (DELTASONE) 20 MG tablet Take 2 tablets (40 mg total) by mouth daily. For 3 days, then 1 tab daily for 3 days 08/20/23   [name], MD  silver sulfADIAZINE (SILVADENE) 1  % cream Apply 1 Application topically daily. 09/20/22   Tillman Abide I, MD  sodium chloride HYPERTONIC 3 % nebulizer solution Take by nebulization in the morning and at bedtime. 06/26/23 06/25/24  Raechel Chute, MD    Scheduled Meds:  apixaban  5 mg Oral BID   arformoterol  15 mcg Nebulization BID   budesonide (PULMICORT) nebulizer solution  0.5 mg Nebulization BID   clonazePAM  0.5 mg Oral QHS   diltiazem  120 mg Oral BID   docusate sodium  100 mg Oral BID   dofetilide  250 mcg Oral BID   finasteride  5 mg Oral Daily   fluticasone  2 spray Each Nare Daily   furosemide  40 mg Oral Daily   gabapentin  200 mg Oral QHS   pantoprazole  40 mg Oral Daily   polyethylene glycol  17 g Oral Daily   [START ON 08/28/2023] predniSONE  40 mg Oral Q breakfast   revefenacin  175 mcg Nebulization Daily   rosuvastatin  20 mg Oral Daily   senna  2 tablet Oral QHS   Continuous Infusions:  cefTRIAXone (ROCEPHIN)  IV 2 g (08/27/23 1102)   PRN Meds:.acetaminophen **OR** acetaminophen, albuterol, ondansetron **OR** ondansetron (ZOFRAN) IV   Level 3 follow-up    Discussed with Dr. Signe Colt via secure chat.   I spent 60 minutes of dedicated to the care of this patient on the date of this encounter to include pre-visit review of records, face-to-face time with the patient discussing conditions above, post visit ordering of testing, clinical documentation with the electronic health record, making appropriate referrals as documented, and communicating necessary findings to members of the patients care team.   C. Danice Goltz, MD Advanced Bronchoscopy PCCM [facility]    *This note was generated using voice recognition software/Dragon and/or AI transcription program.  Despite best efforts to proofread, errors can occur which can change the meaning. Any transcriptional errors that result from this process are unintentional and may not be fully corrected at the time of  dictation.

## 2023-08-27 NOTE — Progress Notes (Signed)
Progress Note   Patient: [name] [name]. ZOX:[id number] DOB: May 16, 1941 DOA: 08/23/2023     4 DOS: the patient was seen and examined on 08/27/2023   Brief hospital course: [name] [name]. is a 83 y.o. male with medical history significant of steroid-dependent COPD Gold stage III, chronic hypoxic respiratory failure on 3 to 4 L as needed, HTN, chronic HFpEF, CAD status post stenting in 2019, PAF on Tikosyn and Eliquis presented with worsening of cough wheezing shortness of breath.  Patient is admitted to the hospitalist service for further management evaluation of respiratory failure secondary to acute COPD, CHF exacerbation.  Assessment and Plan: Acute on chronic hypoxic and hypercapnic respiratory failure Acute COPD exacerbation Remained off BiPAP, currently on 3-4 L supplemental oxygen. Continues to have severe respiratory distress.  Pulmonary saw the patient. Encourage incentive spirometry, flutter valve. Will stop doxycycline Solu-Medrol changed to oral prednisone therapy 40 Mg daily. Continue ICS, LAMA and LABA DuoNebs scheduled every 4. Morphine as needed ordered. Pulmonology advised palliative team consultation.  Acute on chronic HFpEF decompensation Transition to oral Lasix 40 mg daily. Leg swelling improved. Echo reviewed shows EF 60 to 65%, Monitor daily weights, strict input and output. Continue to monitor renal function, electrolytes.   PAF Continue Tikosyn and Cardizem therapy. Cardiology advised not to stop Tikosyn, QTc not prolonged. Monitor telemetry, EKG as needed. Continue Eliquis therapy.   CAD No chest pain or EKG changes noted. Troponin negative. Continue Eliquis and statin  Active Pressure Injury/Wound(s)     Pressure Ulcer  Duration          Pressure Injury 08/25/23 Buttocks Mid Stage 2 -  Partial thickness loss of dermis presenting as a shallow open injury with a red, pink wound bed without slough. 3x.5cm 2 days           Continue  wound care, constipation regimen. Out of bed to chair. Incentive spirometry. Nursing supportive care. Fall, aspiration precautions. DVT prophylaxis   Code Status: Full Code  Subjective: Patient is seen and examined today morning.  He feels short of breath even at rest, has constipation.  No family at bedside.  He understands about palliative consultation recommended by pulmonary doctor.  Physical Exam: Vitals:   08/27/23 0405 08/27/23 0723 08/27/23 0810 08/27/23 1115  BP:   (!) 142/70 (!) 141/72  Pulse:   68 62  Resp:   14 17  Temp:   98 F (36.7 C) 97.7 F (36.5 C)  TempSrc:   Oral Oral  SpO2: 99% 99%    Weight:      Height:        General - Elderly Caucasian ill appearing male, moderate respiratory distress HEENT - PERRLA, EOMI, atraumatic head, non tender sinuses. Lung - Clear, basal rales, diffuse rhonchi, wheezes.  Using accessory muscles. Purse lip breathing. Heart - S1, S2 heard, no murmurs, rubs, 1+ pedal edema. Abdomen - Soft, non tender, nondistended, bowel sounds good Neuro - Alert, awake and oriented x 3, non focal exam. Skin - Warm and dry.  Data Reviewed:      Latest Ref Rng & Units 08/24/2023    4:30 AM 08/23/2023    1:25 PM 07/15/2023   11:14 AM  CBC  WBC 4.0 - 10.5 K/uL 5.6  13.5  13.4   Hemoglobin 13.0 - 17.0 g/dL 81.1  91.4  78.2   Hematocrit 39.0 - 52.0 % 36.0  43.0  38.3   Platelets 150 - 400 K/uL 249  304  360.0  Latest Ref Rng & Units 08/26/2023    8:07 AM 08/25/2023    5:27 AM 08/24/2023    4:30 AM  BMP  Glucose 70 - 99 mg/dL 657  846  962   BUN 8 - 23 mg/dL 49  40  26   Creatinine 0.61 - 1.24 mg/dL 9.52  8.41  3.24   Sodium 135 - 145 mmol/L 136  137  139   Potassium 3.5 - 5.1 mmol/L 4.3  3.9  3.6   Chloride 98 - 111 mmol/L 98  96  94   CO2 22 - 32 mmol/L 30  31  31    Calcium 8.9 - 10.3 mg/dL 8.8  8.6  8.4    No results found.  Family Communication: Discussed with patient, he understand and agree. All questions  answereed.  Disposition: Status is: Inpatient Remains inpatient appropriate because: hypoxia, respiratory distress, copd  Planned Discharge Destination: Home with Home Health     MDM level 3-patient is severely short of breath, has severe COPD, CHF.  Patient is on IV steroids, IV Lasix therapy, nebulizers, supplemental oxygen.  Palliative care consut, patient is at high risk for sudden clinical deterioration.  Author: [name], MD 08/27/2023 4:04 PM Secure chat 7am to 7pm For on call review [email or web address].

## 2023-08-28 ENCOUNTER — Ambulatory Visit: Payer: Self-pay

## 2023-08-28 DIAGNOSIS — R531 Weakness: Secondary | ICD-10-CM

## 2023-08-28 DIAGNOSIS — L8990 Pressure ulcer of unspecified site, unspecified stage: Secondary | ICD-10-CM | POA: Insufficient documentation

## 2023-08-28 DIAGNOSIS — J9621 Acute and chronic respiratory failure with hypoxia: Secondary | ICD-10-CM | POA: Diagnosis not present

## 2023-08-28 DIAGNOSIS — Z515 Encounter for palliative care: Secondary | ICD-10-CM | POA: Diagnosis not present

## 2023-08-28 DIAGNOSIS — I5033 Acute on chronic diastolic (congestive) heart failure: Secondary | ICD-10-CM | POA: Diagnosis not present

## 2023-08-28 DIAGNOSIS — I480 Paroxysmal atrial fibrillation: Secondary | ICD-10-CM | POA: Diagnosis not present

## 2023-08-28 DIAGNOSIS — J9602 Acute respiratory failure with hypercapnia: Secondary | ICD-10-CM | POA: Diagnosis not present

## 2023-08-28 DIAGNOSIS — J101 Influenza due to other identified influenza virus with other respiratory manifestations: Secondary | ICD-10-CM

## 2023-08-28 DIAGNOSIS — I509 Heart failure, unspecified: Secondary | ICD-10-CM | POA: Diagnosis not present

## 2023-08-28 DIAGNOSIS — J9601 Acute respiratory failure with hypoxia: Secondary | ICD-10-CM | POA: Diagnosis not present

## 2023-08-28 DIAGNOSIS — J441 Chronic obstructive pulmonary disease with (acute) exacerbation: Secondary | ICD-10-CM | POA: Diagnosis not present

## 2023-08-28 LAB — CULTURE, BLOOD (ROUTINE X 2)
Culture: NO GROWTH
Special Requests: ADEQUATE

## 2023-08-28 LAB — POTASSIUM: Potassium: 4.2 mmol/L (ref 3.5–5.1)

## 2023-08-28 MED ORDER — ENSURE ENLIVE PO LIQD
237.0000 mL | Freq: Two times a day (BID) | ORAL | Status: DC
  Administered 2023-08-28 – 2023-08-30 (×4): 237 mL via ORAL

## 2023-08-28 MED ORDER — MORPHINE SULFATE (CONCENTRATE) 10 MG /0.5 ML PO SOLN
2.5000 mg | ORAL | Status: DC | PRN
  Administered 2023-08-28 – 2023-08-30 (×4): 2.6 mg via ORAL
  Filled 2023-08-28 (×4): qty 0.5

## 2023-08-28 MED ORDER — MORPHINE SULFATE (CONCENTRATE) 10 MG /0.5 ML PO SOLN
2.5000 mg | Freq: Once | ORAL | Status: AC
  Administered 2023-08-28: 2.6 mg via ORAL
  Filled 2023-08-28: qty 0.5

## 2023-08-28 NOTE — Progress Notes (Signed)
Palliative Care Progress Note, Assessment & Plan   Patient Name: [name] [name].       Date: 08/28/2023 DOB: 1941-01-26  Age: 83 y.o. MRN#: [id number] Attending Physician: [name], MD Primary Care Physician: [name], MD Admit Date: 08/23/2023  Subjective: Patient is sitting up in bed in no apparent distress.  He acknowledges my presence and is able to make his wishes known.  4 L nasal cannula in place.  Patient's wife is at bedside during my visit.  HPI: 83 y.o. male  with past medical history of steroid-dependent COPD Gold stage III, chronic hypoxic respiratory failure on 3 to 4 L as needed, HTN, chronic HFpEF, CAD status post stenting in 2019, PAF on Tikosyn and Eliquis  admitted on 08/23/2023 with shortness of breath, wheezing, and worsening of cough.   Patient is being treated for acute respiratory failure secondary to acute COPD exacerbation and CHF exacerbation.   1/26, patient found to be Influenza A positive.    PMT was consulted to discuss goals of care.  Summary of counseling/coordination of care: Extensive chart review completed prior to meeting patient including labs, vital signs, imaging, progress notes, orders, and available advanced directive documents from current and previous encounters.   After reviewing the patient's chart and assessing the patient at bedside, I spoke with patient in regards to symptom management and goals of care.   Was assessed.  Patient complains of extremely dry mouth.  He understands that his nasal cannula has humidification on it.  However, his mouth and throat remain extremely dry.  He is sipping on water without relief.  I provided oral moisturizer to patient encouraged him to wet his oral mucosa whenever it feels dry.  Discussed with  patient that this will not alleviate the dry mouth but hopefully make it feel more comfortable.  No adjustment to [facility] needed at this time.  I attempted to elicit values and goals important to the patient.  Patient states what is important to him is that he does not IN pain, suffering, or gasping for breath.  He also shares that he wants to continue to work with physical therapy.  He would like to be able to get back to doing the things he used to do, such as riding his electric scooter from building to building, going to the community room for meals, and being able to get out of bed without feeling short of breath.  Patient's wife shares that she hopes patient can attend a family wedding in March.  Counseled patient and wife on patient's chronic illnesses -COPD and CHF-and acute issues -recent COVID infection and influenza A.  Discussed COPD and CHF is chronic, progressive, and irreversible diseases that are often exacerbated by acute illnesses and hospitalizations.  Patient and his wife inquire about when it would be appropriate to call hospice.  Hospice philosophy, hospice at home versus hospice inpatient unit, aging in place, and hospice plan of care discussed with patient and wife.  Space and opportunity provided for patient and wife to ask questions regarding hospice and current medical situation.  Advance care planning, CODE STATUS, and rehospitalization's discussed with patient and wife.  Specifically, CODE STATUS discussed in detail.  DNR with limited interventions, full interventions, and comfort care versus full code discussed in detail.  Patient has elected DNR with limited interventions.  Additionally, he would be accepting of antibiotics if indicated but never accepting of IVF or tube feedings.  Patient wishes to have a natural, peaceful end-of-life with minimal suffering.  He would never be accepting of artificial means to sustain his life.  [place] form completed and placed in shadow  chart.  MOST form completed and placed in shadow chart.  Copies of both DNR and MOST form sent to [facility] records for download to ACP tab in epic.   I completed a MOST form today. Patient's wishes are for the following treatment decisions:  Cardiopulmonary Resuscitation: Do Not Attempt Resuscitation (DNR/No CPR)  Medical Interventions: Limited Additional Interventions: Use medical treatment, IV fluids and cardiac monitoring as indicated, DO NOT USE intubation or mechanical ventilation. May consider use of less invasive airway support such as BiPAP or CPAP. Also provide comfort measures. Transfer to the hospital if indicated. Avoid intensive care.   Antibiotics: Determine use of limitation of antibiotics when infection occurs  IV Fluids: No IV fluids (provide other measures to ensure comfort)  Feeding Tube: No feeding tube    After extensive discussion of palliative versus hospice versus aggressive care, patient shares that he would like to continue with current plan of care.  He would like to work with PT and OT to see what strength he can build back.  I highlighted that patient does not have to have a hospitalization or emergent event in order to enact his hospice benefits.  Discussed use of hospice in the future should patient have decline in functional, nutritional, or cognitive status that is not in line with him being able to "get up and go" like he wants to.  Attending and RN made aware of change of CODE STATUS and completion of MOST form.  PMT remains low to patient and family throughout his hospitalization.  Physical Exam Vitals reviewed.  Constitutional:      General: He is not in acute distress.    Appearance: He is normal weight.  HENT:     Head: Normocephalic.  Eyes:     Pupils: Pupils are equal, round, and reactive to light.  Cardiovascular:     Rate and Rhythm: Normal rate.  Pulmonary:     Breath sounds: Examination of the right-lower field reveals wheezing. Examination of  the left-lower field reveals wheezing. Wheezing present.  Musculoskeletal:     Comments: Generalized weakness  Skin:    General: Skin is warm and dry.  Neurological:     Mental Status: He is alert and oriented to person, place, and time.  Psychiatric:        Mood and Affect: Mood normal. Mood is not anxious.        Behavior: Behavior normal. Behavior is not agitated.             Total Time 70 minutes   Time spent includes: Detailed review of medical records (labs, imaging, vital signs), medically appropriate exam (mental status, respiratory, cardiac, skin), discussed with treatment team, counseling and educating patient, family and staff, documenting clinical information, medication management and coordination of care.  Samara Deist L. Bonita Quin, DNP, FNP-BC Palliative Medicine Team

## 2023-08-28 NOTE — Progress Notes (Signed)
Occupational Therapy Treatment Patient Details Name: [name] [name]. MRN: [id number] DOB: Apr 08, 1941 Today's Date: 08/28/2023   History of present illness Pt is an 83 y.o. male presenting to hospital 08/23/23 with c/o worsening SOB.  Pt admitted with acute on chronic hypoxic and hypercapnic respiratory failure, acute COPD exacerbation, acute on chronic HFpEF decompensation, PAF.  (+) Influenza.  PMH includes COPD on home O2, pulmonary nodular disease, CAD, htn, HLD, a-fib, Tikosyn, and GERD.   OT comments  Pt seen for OT tx with spouse present. No c/o pain throughout, on 4L via [place], SpO2 100%. Pt significantly limited by SOB and increased work of breathing. Able to sit EOB for ~90 seconds to wash face with supervision before needing to return to supine. +2 assist to scoot up toward HOB. Pt and spouse educated on recommendations for discharge as pt is below PLOF and is unable to perform BADLs without SOB. PTA, pt was able to walk short distances to bathroom and is unable to perform at this time. RN updated on pt status. Patient will benefit from continued inpatient follow up therapy, <3 hours/day       If plan is discharge home, recommend the following:  A little help with walking and/or transfers;A little help with bathing/dressing/bathroom;Assistance with cooking/housework;Assist for transportation;Help with stairs or ramp for entrance;Direct supervision/assist for medications management   Equipment Recommendations  BSC/3in1       Precautions / Restrictions Precautions Precautions: Fall Restrictions Weight Bearing Restrictions Per Provider Order: No       Mobility Bed Mobility Overal bed mobility: Needs Assistance Bed Mobility: Supine to Sit, Sit to Supine     Supine to sit: Supervision Sit to supine: Supervision        Transfers                   General transfer comment: unable to attempt with SOB     Balance Overall balance assessment: Needs  assistance Sitting-balance support: No upper extremity supported, Feet supported Sitting balance-[name]: Good Sitting balance - Comments: limited by SOB                                   ADL either performed or assessed with clinical judgement   ADL Overall ADL's : Needs assistance/impaired     Grooming: Wash/dry hands;Wash/dry face;Sitting                                 General ADL Comments: Pt requires increased time/effort to complete exertional tasks.Difficulties describing SOB on scale (generally confused despite multiple attempts offered to edu by OT), but completed supine>sit quickly with supervision, sat EOB for ~90 seconds to wash face before SOB and increased respiratory effort limited tolerance. Pt unable to perform lateral scoots, requiring +2 to scoot up toward [facility]      Cognition Arousal: Alert Behavior During Therapy: WFL for tasks assessed/performed Overall Cognitive Status: Impaired/Different from baseline Area of Impairment: Safety/judgement                         Safety/Judgement: Decreased awareness of deficits     General Comments: pt providing conflicting info regarding mobility status; initally states he can walk ~100 yrds, and has been walking into bathroom in hospital, then wife states he has not been OOB  General Comments SpO2 100% throughout on 4L via [place], pt with effortful breathing with talking or mobility    Pertinent Vitals/ Pain       Pain Assessment Pain Assessment: No/denies pain         Frequency  Min 1X/week        Progress Toward Goals  OT Goals(current goals can now be found in the care plan section)  Progress towards OT goals: Progressing toward goals  Acute Rehab OT Goals OT Goal Formulation: With patient/family Time For Goal Achievement: 09/07/23 Potential to Achieve Goals: Good ADL Goals Pt Will Perform Lower Body Dressing: with modified independence;with  adaptive equipment;sitting/lateral leans;sit to/from stand Pt Will Transfer to Toilet: with modified independence;ambulating Additional ADL Goal #1: Pt will verbalize plan to implement at least 1 learned ECS/falls prevention strategy into daily ADL/mobility routine to maximize safety. Additional ADL Goal #2: Pt will complete seated grooming tasks without SOB, 2/2 opportunities  Plan         AM-PAC OT "6 Clicks" Daily Activity     Outcome Measure   Help from another person eating meals?: None Help from another person taking care of personal grooming?: None Help from another person toileting, which includes using toliet, bedpan, or urinal?: A Little Help from another person bathing (including washing, rinsing, drying)?: A Little Help from another person to put on and taking off regular upper body clothing?: None Help from another person to put on and taking off regular lower body clothing?: A Little 6 Click Score: 21    End of Session Equipment Utilized During Treatment: Oxygen  OT Visit Diagnosis: Other abnormalities of gait and mobility (R26.89)   Activity Tolerance Treatment limited secondary to medical complications (Comment) (SOB)   Patient Left in bed;with call bell/phone within reach;with bed alarm set;with family/visitor present   Nurse Communication Mobility status        Time: 4098-1191 OT Time Calculation (min): 36 min  Charges: OT General Charges $OT Visit: 1 Visit OT Treatments $Self Care/Home Management : 23-37 mins  Tuff Clabo L. Brylin Stopper, OTR/L  08/28/23, 1:07 PM

## 2023-08-28 NOTE — Progress Notes (Signed)
PT Cancellation Note  Patient Details Name: [name] [name]. MRN: [id number] DOB: [birth year]   Cancelled Treatment:     Pt struggled during OT session earlier while sitting EOB. Attempt made to see pt for skilled PT services. Pt states he feels worse than previous day, having difficulty breathing with increased resp. Rate. Also noted to have a very wet cough, SpO2 100% on 4L. Will re-attempt next available date per POC and pt tolerance.    Jannet Askew 08/28/2023, 1:23 PM

## 2023-08-28 NOTE — Progress Notes (Signed)
NAME:  [name] Ake., MRN:  [id number], DOB:  07-02-1941, LOS: 5 ADMISSION DATE:  08/23/2023, CONSULTATION DATE:  08/25/2023 by Dr. Eugenia Mcalpine [name] [name]: [name] Duster, [name] [name] CHIEF COMPLAINT: Follow-up requested, exacerbation of COPD  History of Present Illness:  83 y.o. male with medical history significant of steroid-dependent COPD Gold stage III, chronic hypoxic respiratory failure on 3 to 4 L as needed, HTN, chronic HFpEF, CAD status post stenting in 2019, PAF on Tikosyn and Eliquis    Presented with worsening of cough wheezing shortness of breath.  Patient is admitted to the hospitalist service for further management evaluation of respiratory failure secondary to acute COPD, CHF exacerbation.    Patient placed on biPAP, weaned off at this time Remains very SOB, using accessory muscles to breathe   PCCM asked to see patient  Pertinent  Medical History  COPD GOLD 3E (suspect now GOLD 4) last PFTs 2019 Excessive dynamic airway collapse (EDAC) Chronic steroid-dependent Chronic respiratory failure with hypoxia on supplemental O2 Chronic heart failure with preserved ejection fraction Paroxysmal atrial fibrillation on Tikosyn and Eliquis  Interim History / Subjective:  Continues to be very breathless.  Discussed dyspnea likely related to dynamic airway collapse and air trapping.  Clearing of secretions better with Acapella.  Discussed using CPAP/BiPAP nocturnally but would like to try as needed morphine first.  Objective   Blood pressure 123/69, pulse 65, temperature 98 F (36.7 C), resp. rate 14, height 5\' 10"  (1.778 m), weight 64.5 kg, SpO2 100%.    SpO2: 100 % O2 Flow Rate (L/min): 4 L/min FiO2 (%): 36 %    Intake/Output Summary (Last 24 hours) at 08/28/2023 1409 Last data filed at 08/27/2023 2110 Gross per 24 hour  Intake --  Output 1150 ml  Net -1150 ml   Filed Weights   08/25/23 1616  Weight: 64.5 kg   Examination: GENERAL: Well-developed, chronically  ill-appearing, debilitated gentleman lying in bed using pursed lip breathing.  Supplemental oxygen in place. HEAD: Normocephalic, atraumatic.  EYES: Pupils equal, round, reactive to light.  No scleral icterus.  MOUTH: Multiple missing teeth, oral mucosa moist.  No thrush. NECK: Supple. No thyromegaly. Trachea midline. No JVD.  No adenopathy. PULMONARY: Poor air entry bilaterally.  No end expiratory wheezing, I E ratio 1:3.  Positive Hoover's sign. CARDIOVASCULAR: S1 and S2. Regular rate and rhythm.  No rubs, murmurs or gallops heard. ABDOMEN: Protuberant, mild thoracoabdominal asynchrony. MUSCULOSKELETAL: No joint deformity, no clubbing, no edema.  Significant sarcopenia/muscle mass loss noted. NEUROLOGIC: Very debilitated with no overt focal deficit noted. SKIN: Intact,warm,dry. PSYCH: Flat affect, cooperative.  Assessment & Plan:  Acute on chronic hypoxic respiratory failure D/T severe COPD exacerbation,D/T influenza AH1 Hx: GOLD 3 severe COPD Continue oxygen supplementation to maintain sats between 88 to 92%. Continue bronchodilators Optimized with Brovana/Pulmicort and Yupelri Continue as needed albuterol Continue pulmonary hygiene with flutter valve Continue steroids due to patient's chronic steroid dependence Taper of steroids slowly over 2-week period to 10 mg baseline Palliative Medicine has connected with patient, following Debilitating dyspnea trial of as needed MSIR  Acute on chronic heart failure with preserved ejection fraction Decompensation likely due to increased work of breathing Management per hospitalist team Patient on diuretics Monitor renal function and electrolytes Supplement electrolytes as needed  Paroxysmal atrial fibrillation Patient on Tikosyn which limits choice of antibiotics as needed Continue to monitor Continue Eliquis  End-of-life discussion Patient is DNR He is on ongoing discussion with Palliative Medicine   Labs   CBC: Recent  Labs   Lab 08/23/23 1325 08/24/23 0430  WBC 13.5* 5.6  NEUTROABS 12.2*  --   HGB 13.6 11.8*  HCT 43.0 36.0*  MCV 96.6 93.0  PLT 304 249    Basic Metabolic Panel: Recent Labs  Lab 08/23/23 1325 08/24/23 0430 08/25/23 0527 08/26/23 0807 08/28/23 0937  NA 142 139 137 136  --   K 4.0 3.6 3.9 4.3 4.2  CL 95* 94* 96* 98  --   CO2 36* 31 31 30   --   GLUCOSE 147* 162* 179* 172*  --   BUN 23 26* 40* 49*  --   CREATININE 0.93 1.00 1.10 1.12  --   CALCIUM 9.1 8.4* 8.6* 8.8*  --   MG  --  2.5* 2.4 2.3  --    GFR: Estimated Creatinine Clearance: 46.4 mL/min (by C-G formula based on SCr of 1.12 mg/dL). Recent Labs  Lab 08/23/23 1325 08/23/23 2010 08/24/23 0430  PROCALCITON <0.10  --   --   WBC 13.5*  --  5.6  LATICACIDVEN 1.2 1.3  --     Liver Function Tests: Recent Labs  Lab 08/23/23 1325  AST 25  ALT 18  ALKPHOS 61  BILITOT 0.4  PROT 6.9  ALBUMIN 3.6   No results for input(s): "LIPASE", "AMYLASE" in the last 168 hours. No results for input(s): "AMMONIA" in the last 168 hours.  ABG    Component Value Date/Time   HCO3 45.2 (H) 08/23/2023 1325   O2SAT 52.4 08/23/2023 1325     Coagulation Profile: No results for input(s): "INR", "PROTIME" in the last 168 hours.  Cardiac Enzymes: No results for input(s): "CKTOTAL", "CKMB", "CKMBINDEX", "TROPONINI" in the last 168 hours.  HbA1C: Hgb A1c MFr Bld  Date/Time Value Ref Range Status  07/15/2023 11:14 AM 4.8 4.6 - 6.5 % Final    Comment:    Glycemic Control Guidelines for People with Diabetes:Non Diabetic:  <6%Goal of Therapy: <7%Additional Action Suggested:  >8%   08/13/2022 01:29 AM 5.0 4.8 - 5.6 % Final    Comment:    (NOTE) Pre diabetes:          5.7%-6.4%  Diabetes:              >6.4%  Glycemic control for   <7.0% adults with diabetes     CBG: No results for input(s): "GLUCAP" in the last 168 hours.  Review of Systems:   A 10 point review of systems was performed and it is as noted above otherwise  negative.  Past Medical History:  He,  has a past medical history of Abdominal discomfort (12/07/2020), Acquired trigger finger of right middle finger (02/04/2020), Acute prostatitis (04/08/2018), Alcohol abuse, Alcohol dependence (HCC) (12/07/2020), Anal or rectal pain (04/08/2018), Annual physical exam (12/07/2020), Anorectal disorder (12/07/2020), Arthritis, Atherosclerotic heart disease of native coronary artery without angina pectoris (04/08/2018), Atrial flutter (HCC), Benign prostatic hyperplasia with lower urinary tract symptoms (12/07/2020), BPH with elevated PSA, Cancer (HCC), Carotid arterial disease (HCC), Chronic obstructive pulmonary disease with (acute) exacerbation (HCC) (12/07/2020), Chronic respiratory failure with hypoxia (HCC) (08/10/2019), Chronic sinusitis (12/07/2020), Cigarette nicotine dependence, uncomplicated (04/09/2018), Coagulation disorder (HCC) (01/13/2019), 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), 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), Contracture of palmar fascia (12/07/2020), COPD (chronic obstructive pulmonary disease) (HCC), Corn of toe (12/07/2020), Corns and callosities (04/09/2018), Coronary artery disease, Coronary atherosclerosis (05/07/2018), Degenerative disc disease, 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), Degenerative disc disease, lumbar (10/02/2021), Diarrhea (04/09/2018), Double vision with both eyes open (12/07/2020), 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), Dysphagia (08/14/2018),  Dyspnea, ED (erectile dysfunction), ED (erectile dysfunction) of organic origin (12/07/2020), Elevated prostate specific antigen (PSA) (04/08/2018), Elevated PSA, Encounter for screening for other disorder (12/07/2020), Enlarged prostate without lower urinary tract symptoms (luts) (04/09/2018), Enthesopathy (12/07/2020), Essential (primary) hypertension (04/08/2018), Ex-smoker (04/09/2018), Exertional dyspnea (04/02/2018), 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), Flatulence (04/08/2018),  Gastro-esophageal reflux disease without esophagitis (04/08/2018), GERD (gastroesophageal reflux disease), History of acute otitis externa (08/14/2018), History of echocardiogram, Hyperlipidemia, Hypertension, Hypoxemia (12/07/2020), Kidney cysts, Left knee pain (12/07/2020), Low back pain (04/08/2018), Male erectile dysfunction (04/09/2018), Mixed hyperlipidemia (04/08/2018), Nocturia more than twice per night (04/09/2018), Osteoarthritis of first carpometacarpal joint (04/08/2018), Osteoarthritis of knee (12/07/2020), Other long term (current) drug therapy (12/07/2020), Pain due to onychomycosis of toenail of left foot (07/20/2019), Pain in joint (04/09/2018), Pain in knee (04/09/2018), Pain in unspecified knee (12/07/2020), Pain of finger (04/09/2018), Palpitations, 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HCC) (12/07/2020), Personal history of colonic polyps (12/07/2020), Pneumonia, Pneumothorax (04/12/2020), Polypharmacy (04/09/2018), Porokeratosis (01/13/2019), 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HCC), 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   Surgical History:   Past Surgical History:  Procedure Laterality Date   BRONCHIAL BIOPSY  10/13/2019   Procedure: BRONCHIAL BIOPSIES;  Surgeon: Leslye Peer, [name] [name];  Location: [facility] ENDOSCOPY;  Service: Pulmonary;;   BRONCHIAL BIOPSY  04/12/2020   Procedure: BRONCHIAL BIOPSIES;  Surgeon: Leslye Peer, [name] [name];  Location: [facility] ENDOSCOPY;   Service: Pulmonary;;   BRONCHIAL BRUSHINGS  10/13/2019   Procedure: BRONCHIAL BRUSHINGS;  Surgeon: Leslye Peer, [name] [name];  Location: [facility] ENDOSCOPY;  Service: Pulmonary;;   BRONCHIAL BRUSHINGS  04/12/2020   Procedure: BRONCHIAL BRUSHINGS;  Surgeon: Leslye Peer, [name] [name];  Location: [facility] ENDOSCOPY;  Service: Pulmonary;;   BRONCHIAL NEEDLE ASPIRATION BIOPSY  10/13/2019   Procedure: BRONCHIAL NEEDLE ASPIRATION BIOPSIES;  Surgeon: Leslye Peer, [name] [name];  Location: [facility] ENDOSCOPY;  Service: Pulmonary;;   BRONCHIAL NEEDLE ASPIRATION BIOPSY  04/12/2020   Procedure: BRONCHIAL NEEDLE ASPIRATION BIOPSIES;  Surgeon: Leslye Peer, [name] [name];  Location: [facility] ENDOSCOPY;  Service: Pulmonary;;   BRONCHIAL WASHINGS  10/13/2019   Procedure: BRONCHIAL WASHINGS;  Surgeon: Leslye Peer, [name] [name];  Location: [facility] ENDOSCOPY;  Service: Pulmonary;;   BRONCHIAL WASHINGS  04/12/2020   Procedure: BRONCHIAL WASHINGS;  Surgeon: Leslye Peer, [name] [name];  Location: [facility] ENDOSCOPY;  Service: Pulmonary;;   CARDIAC CATHETERIZATION  2002   50% RCA   CARDIOVERSION N/A 09/17/2022   Procedure: CARDIOVERSION;  Surgeon: Iran Ouch, [name] [name];  Location: [facility] ORS;  Service: Cardiovascular;  Laterality: N/A;   CATARACT EXTRACTION, BILATERAL     CORONARY STENT INTERVENTION N/A 04/15/2018   Procedure: CORONARY STENT INTERVENTION;  Surgeon: Corky Crafts, [name] [name];  Location: [facility] INVASIVE CV LAB;  Service: Cardiovascular;  Laterality: N/A;  om1   EYE SURGERY     KNEE ARTHROSCOPY     LEFT HEART CATH AND CORONARY ANGIOGRAPHY N/A 04/15/2018   Procedure: LEFT HEART CATH AND CORONARY ANGIOGRAPHY;  Surgeon: Corky Crafts, [name] [name];  Location: [facility] INVASIVE CV LAB;  Service: Cardiovascular;  Laterality: N/A;   MULTIPLE TOOTH EXTRACTIONS     TONSILLECTOMY     VIDEO BRONCHOSCOPY  04/12/2020   VIDEO BRONCHOSCOPY WITH ENDOBRONCHIAL NAVIGATION N/A 07/26/2016  Procedure: VIDEO BRONCHOSCOPY WITH ENDOBRONCHIAL NAVIGATION;  Surgeon: Leslye Peer, [name] [name];  Location: [facility] OR;  Service:  Thoracic;  Laterality: N/A;   VIDEO BRONCHOSCOPY WITH ENDOBRONCHIAL NAVIGATION N/A 10/13/2019   Procedure: [facility] AND VIDEO BRONCHOSCOPY WITH ENDOBRONCHIAL NAVIGATION;  Surgeon: Leslye Peer, [name] [name];  Location: [facility] ENDOSCOPY;  Service: Pulmonary;  Laterality: N/A;   VIDEO BRONCHOSCOPY WITH ENDOBRONCHIAL NAVIGATION N/A 04/12/2020   Procedure: VIDEO BRONCHOSCOPY WITH ENDOBRONCHIAL NAVIGATION;  Surgeon: Leslye Peer, [name] [name];  Location: [facility] ENDOSCOPY;  Service: Pulmonary;  Laterality: N/A;     Social History:   reports that he quit smoking about 3 years ago. His smoking use included cigarettes. He started smoking about 71 years ago. He has a 51 pack-year smoking history. He has never been exposed to tobacco smoke. He has never used smokeless tobacco. He reports that he does not drink alcohol and does not use drugs.   Family History:  His family history includes Alzheimer's disease in his mother; Hypertension in his mother.   Allergies Allergies  Allergen Reactions   Flagyl [Metronidazole] Other (See Comments)    Other reaction(s): skin reaction     Home Medications  Prior to Admission medications   Medication Sig Start Date End Date Taking? Authorizing Provider  acetaminophen (TYLENOL) 325 MG tablet Take 650 mg by mouth every 4 (four) hours as needed.   Yes Provider, Historical, [name] [name]  albuterol (VENTOLIN HFA) 108 (90 Base) MCG/ACT inhaler INHALE 2 PUFFS 2-3 TIMES A DAY AS NEEDED FOR WHEEZING (30 DAYS) 30 DAY(S) 07/01/23  Yes [name], [name] [name]  apixaban (ELIQUIS) 5 MG TABS tablet Take 1 tablet (5 mg total) by mouth 2 (two) times daily. 08/01/23  Yes [name], [name] [name]  Budeson-Glycopyrrol-Formoterol (BREZTRI AEROSPHERE) 160-9-4.8 MCG/ACT AERO Inhale 2 puffs into the lungs 2 (two) times daily. 08/01/23  Yes [name], [name] [name]  clonazePAM (KLONOPIN) 0.5 MG tablet TAKE 1 TABLET BY MOUTH AT BEDTIME. 08/02/23  Yes [name], [name] [name]  diltiazem (CARDIZEM CD) 120 MG 24 hr capsule Take 1 capsule (120 mg  total) by mouth 2 (two) times daily. 08/30/22  Yes Furth, Cadence H, PA-C  Docusate Calcium (STOOL SOFTENER PO) Take 1 tablet by mouth every evening.   Yes Provider, Historical, [name] [name]  dofetilide (TIKOSYN) 250 MCG capsule Take 1 capsule (250 mcg total) by mouth 2 (two) times daily. 03/21/23  Yes Sherie Don, NP  finasteride (PROSCAR) 5 MG tablet Take 5 mg by mouth daily.   Yes Provider, Historical, [name] [name]  furosemide (LASIX) 20 MG tablet Take 2 tablets (40 mg total) by mouth as directed. Take 2 tablets in the morning (40 mg) for 4 days. 07/12/23  Yes Sherie Don, NP  gabapentin (NEURONTIN) 100 MG capsule Take 200 mg by mouth at bedtime. 07/26/21  Yes Provider, Historical, [name] [name]  ipratropium (ATROVENT) 0.03 % nasal spray Place 2 sprays into both nostrils every 12 (twelve) hours. 10/19/22  Yes Leslye Peer, [name] [name]  ipratropium-albuterol (DUONEB) 0.5-2.5 (3) MG/3ML SOLN INHALE 3 ML BY NEBULIZER EVERY 6 HOURS AS NEEDED 11/29/22  Yes Byrum, Les Pou, [name] [name]  nitroGLYCERIN (NITROSTAT) 0.4 MG SL tablet Place 1 tablet (0.4 mg total) under the tongue every 5 (five) minutes as needed for chest pain. 07/13/21  Yes Revankar, Aundra Dubin, [name] [name]  pantoprazole (PROTONIX) 40 MG tablet Take 1 tablet (40 mg total) by mouth daily. 03/13/23  Yes [name], [name] [name]  polyethylene glycol (MIRALAX / GLYCOLAX) 17 g packet Take 17 g by mouth daily as needed  for moderate constipation.   Yes Provider, Historical, [name] [name]  predniSONE (DELTASONE) 5 MG tablet Take 2 tablets (10 mg total) by mouth daily with breakfast. Patient taking differently: Take 7.5 mg by mouth daily with breakfast. 06/26/23 06/25/24 Yes Dgayli, Lianne Bushy, [name] [name]  rosuvastatin (CRESTOR) 20 MG tablet Take 1 tablet (20 mg total) by mouth daily. 04/16/23  Yes Sherie Don, NP  cefpodoxime (VANTIN) 200 MG tablet Take 1 tablet (200 mg total) by mouth 2 (two) times daily for 7 days. Patient not taking: Reported on 08/23/2023 08/22/23 08/29/23  Raechel Chute, [name] [name]  OXYGEN Inhale into the lungs. 3-4  liters upon exertion and 3 liters continuous at night.    Provider, Historical, [name] [name]  predniSONE (DELTASONE) 20 MG tablet Take 2 tablets (40 mg total) by mouth daily. For 3 days, then 1 tab daily for 3 days 08/20/23   [name], [name] [name]  silver sulfADIAZINE (SILVADENE) 1 % cream Apply 1 Application topically daily. 09/20/22   Tillman Abide I, [name] [name]  sodium chloride HYPERTONIC 3 % nebulizer solution Take by nebulization in the morning and at bedtime. 06/26/23 06/25/24  Raechel Chute, [name] [name]    Scheduled Meds:  apixaban  5 mg Oral BID   arformoterol  15 mcg Nebulization BID   budesonide (PULMICORT) nebulizer solution  0.5 mg Nebulization BID   clonazePAM  0.5 mg Oral QHS   diltiazem  120 mg Oral BID   docusate sodium  100 mg Oral BID   dofetilide  250 mcg Oral BID   feeding supplement  237 mL Oral BID BM   finasteride  5 mg Oral Daily   fluticasone  2 spray Each Nare Daily   furosemide  40 mg Oral Daily   gabapentin  200 mg Oral QHS   pantoprazole  40 mg Oral Daily   polyethylene glycol  17 g Oral Daily   predniSONE  40 mg Oral Q breakfast   revefenacin  175 mcg Nebulization Daily   rosuvastatin  20 mg Oral Daily   senna  2 tablet Oral QHS   Continuous Infusions:   PRN Meds:.acetaminophen **OR** acetaminophen, albuterol, ondansetron **OR** ondansetron (ZOFRAN) IV   Level 2 follow-up    Patient's wife at bedside, updated both the patient and wife.   I spent 40 minutes of dedicated to the care of this patient on the date of this encounter to include pre-visit review of records, face-to-face time with the patient discussing conditions above, post visit ordering of testing, clinical documentation with the electronic health record, making appropriate referrals as documented, and communicating necessary findings to members of the patients care team.   C. Danice Goltz, [name] [name] Advanced Bronchoscopy PCCM [facility]    *This note was generated using voice recognition  software/Dragon and/or AI transcription program.  Despite best efforts to proofread, errors can occur which can change the meaning. Any transcriptional errors that result from this process are unintentional and may not be fully corrected at the time of dictation.

## 2023-08-28 NOTE — Progress Notes (Signed)
Progress Note   Patient: [name] [name]. OZH:[id number] DOB: 1941-04-17 DOA: 08/23/2023     5 DOS: the patient was seen and examined on 08/28/2023   Brief hospital course: [name] [name]. is a 83 y.o. male with medical history significant of steroid-dependent COPD Gold stage III, chronic hypoxic respiratory failure on 3 to 4 L as needed, HTN, chronic HFpEF, CAD status post stenting in 2019, PAF on Tikosyn and Eliquis presented with worsening of cough wheezing shortness of breath.  Patient is admitted to the hospitalist service for further management evaluation of respiratory failure secondary to acute COPD, CHF exacerbation. Tested positive for Flu A.   Assessment and Plan: Acute on chronic hypoxic and hypercapnic respiratory failure Acute COPD exacerbation Influenza A infection. Remained off BiPAP, currently on 3-4 L supplemental oxygen. Shortness of breath improved  Pulmonary advised palliative care. Encourage incentive spirometry, flutter valve. Finished doxycycline Continue oral prednisone therapy 40 Mg daily. Continue ICS, LAMA and LABA DuoNebs scheduled every 4. Morphine as needed ordered. Palliative discussed with him and family, CODE STATUS updated to DNR- limited.  Acute on chronic HFpEF decompensation Transition to oral Lasix 40 mg daily. Leg swelling improved. Echo reviewed shows EF 60 to 65%, Monitor daily weights, strict input and output. Continue to monitor renal function, electrolytes.   PAF Continue Tikosyn and Cardizem therapy. Cardiology advised not to stop Tikosyn, QTc not prolonged. Monitor telemetry, EKG as needed. Continue Eliquis therapy.   CAD No chest pain or EKG changes noted. Troponin negative. Continue Eliquis and statin  Active Pressure Injury/Wound(s)     Pressure Ulcer  Duration          Pressure Injury 08/25/23 Buttocks Mid Stage 2 -  Partial thickness loss of dermis presenting as a shallow open injury with a red, pink wound bed  without slough. 3x.5cm 2 days           Continue wound care, constipation regimen. Out of bed to chair. Incentive spirometry. Nursing supportive care. Fall, aspiration precautions. DVT prophylaxis   Code Status: Limited: Do not attempt resuscitation (DNR) -DNR-LIMITED -Do Not Intubate/DNI  Palliative team discussed with patient, wife at bedside. Updated MOST form.  Subjective: Patient is seen and examined today morning.  Short of breath better.  No family at bedside. He is eating fair. Currently on 3-4L supplemental o2. Advised out of bed.  Physical Exam: Vitals:   08/28/23 1205 08/28/23 1617 08/28/23 2002 08/28/23 2007  BP: 123/69 128/70 118/65   Pulse: 65 82 79   Resp: 14 18 20    Temp: 98 F (36.7 C) 97.7 F (36.5 C) 98.2 F (36.8 C)   TempSrc:  Oral Oral   SpO2: 100% 100% 99% 99%  Weight:      Height:        General - Elderly Caucasian ill appearing male, mild respiratory distress HEENT - PERRLA, EOMI, atraumatic head, non tender sinuses. Lung - Clear, basal rales, diffuse rhonchi, wheezes. Heart - S1, S2 heard, no murmurs, rubs, 1+ pedal edema. Abdomen - Soft, non tender, nondistended, bowel sounds good Neuro - Alert, awake and oriented x 3, non focal exam. Skin - Warm and dry.  Data Reviewed:      Latest Ref Rng & Units 08/24/2023    4:30 AM 08/23/2023    1:25 PM 07/15/2023   11:14 AM  CBC  WBC 4.0 - 10.5 K/uL 5.6  13.5  13.4   Hemoglobin 13.0 - 17.0 g/dL 62.9  52.8  12.4  Hematocrit 39.0 - 52.0 % 36.0  43.0  38.3   Platelets 150 - 400 K/uL 249  304  360.0       Latest Ref Rng & Units 08/28/2023    9:37 AM 08/26/2023    8:07 AM 08/25/2023    5:27 AM  BMP  Glucose 70 - 99 mg/dL  161  096   BUN 8 - 23 mg/dL  49  40   Creatinine 0.45 - 1.24 mg/dL  4.09  8.11   Sodium 914 - 145 mmol/L  136  137   Potassium 3.5 - 5.1 mmol/L 4.2  4.3  3.9   Chloride 98 - 111 mmol/L  98  96   CO2 22 - 32 mmol/L  30  31   Calcium 8.9 - 10.3 mg/dL  8.8  8.6    No results  found.  Family Communication: Discussed with patient, he understand and agree. All questions answereed.  Disposition: Status is: Inpatient Remains inpatient appropriate because: hypoxia, respiratory distress, copd  Planned Discharge Destination: Home with Home Health     Time spent .  Author: [name], MD 08/28/2023 8:21 PM Secure chat 7am to 7pm For on call review [email or web address].

## 2023-08-28 NOTE — Patient Outreach (Signed)
  Care Coordination   08/28/2023 Name: [name] [name]. MRN: [id number] DOB: Jul 10, 1941   Care Coordination Per chart review patient admitted to the hospital.   Follow Up Plan:  Additional outreach attempts will be made to offer the patient complex care management information and services.   Encounter Outcome:  call back to patient.   Care Coordination Interventions:  No, not indicated    Nash Dimmer, BSN, CCM [place]  [facility], Population Health Case Manager Phone: [phone]

## 2023-08-28 NOTE — Plan of Care (Signed)
Problem: Clinical Measurements: Goal: Ability to maintain clinical measurements within normal limits will improve Outcome: Progressing   Problem: Nutrition: Goal: Adequate nutrition will be maintained Outcome: Progressing   Problem: Coping: Goal: Level of anxiety will decrease Outcome: Progressing   Problem: Safety: Goal: Ability to remain free from injury will improve Outcome: Progressing

## 2023-08-29 ENCOUNTER — Other Ambulatory Visit: Payer: Self-pay | Admitting: Pulmonary Disease

## 2023-08-29 DIAGNOSIS — J441 Chronic obstructive pulmonary disease with (acute) exacerbation: Secondary | ICD-10-CM | POA: Diagnosis not present

## 2023-08-29 DIAGNOSIS — J432 Centrilobular emphysema: Secondary | ICD-10-CM | POA: Diagnosis not present

## 2023-08-29 DIAGNOSIS — R531 Weakness: Secondary | ICD-10-CM | POA: Diagnosis not present

## 2023-08-29 DIAGNOSIS — J9601 Acute respiratory failure with hypoxia: Secondary | ICD-10-CM | POA: Diagnosis not present

## 2023-08-29 DIAGNOSIS — I5033 Acute on chronic diastolic (congestive) heart failure: Secondary | ICD-10-CM | POA: Diagnosis not present

## 2023-08-29 DIAGNOSIS — Z515 Encounter for palliative care: Secondary | ICD-10-CM | POA: Diagnosis not present

## 2023-08-29 DIAGNOSIS — J9602 Acute respiratory failure with hypercapnia: Secondary | ICD-10-CM | POA: Diagnosis not present

## 2023-08-29 DIAGNOSIS — J9621 Acute and chronic respiratory failure with hypoxia: Secondary | ICD-10-CM | POA: Diagnosis not present

## 2023-08-29 LAB — BASIC METABOLIC PANEL
Anion gap: 7 (ref 5–15)
BUN: 34 mg/dL — ABNORMAL HIGH (ref 8–23)
CO2: 34 mmol/L — ABNORMAL HIGH (ref 22–32)
Calcium: 8.4 mg/dL — ABNORMAL LOW (ref 8.9–10.3)
Chloride: 98 mmol/L (ref 98–111)
Creatinine, Ser: 0.88 mg/dL (ref 0.61–1.24)
GFR, Estimated: >60 mL/min (ref >60)
Glucose, Bld: 119 mg/dL — ABNORMAL HIGH (ref 70–99)
Potassium: 4.9 mmol/L (ref 3.5–5.1)
Sodium: 139 mmol/L (ref 135–145)

## 2023-08-29 NOTE — Progress Notes (Signed)
Pharmacy: Dofetilide (Tikosyn) - Follow Up Assessment and Electrolyte Replacement  Pharmacy consulted to assist in monitoring and replacing electrolytes in this 83 y.o. male admitted on 08/23/2023 on dofetilide. Stable home dose resumed, missed doses documented. Qtc < 500.  Labs:    Component Value Date/Time   K 4.9 08/29/2023 0505   MG 2.3 08/26/2023 0807     Plan: Potassium: K >/= 4: No additional supplementation needed  Magnesium: Mg > 2: No additional supplementation needed   Cheronda Erck Rodriguez-Guzman PharmD, BCPS 08/29/2023 9:21 AM

## 2023-08-29 NOTE — Progress Notes (Signed)
NAME:  [name] Keats., MRN:  [id number], DOB:  1940-08-28, LOS: 6 ADMISSION DATE:  08/23/2023, CONSULTATION DATE:  08/25/2023 by Dr. Eugenia Mcalpine MD: [name], MD CHIEF COMPLAINT: Follow-up requested, exacerbation of COPD  History of Present Illness:  83 y.o. male with medical history significant of steroid-dependent COPD Gold stage III, chronic hypoxic respiratory failure on 3 to 4 L as needed, HTN, chronic HFpEF, CAD status post stenting in 2019, PAF on Tikosyn and Eliquis    Presented with worsening of cough wheezing shortness of breath.  Patient is admitted to the hospitalist service for further management evaluation of respiratory failure secondary to acute COPD, CHF exacerbation.    Patient placed on biPAP, weaned off at this time Remains very SOB, using accessory muscles to breathe   PCCM asked to see patient  Pertinent  Medical History  COPD GOLD 3E (suspect now GOLD 4) last PFTs 2019 Excessive dynamic airway collapse (EDAC) Chronic steroid-dependent Chronic respiratory failure with hypoxia on supplemental O2 Chronic heart failure with preserved ejection fraction Paroxysmal atrial fibrillation on Tikosyn and Eliquis  Interim History / Subjective:  Feels better today still having difficulty bringing up secretions.  Discussed dyspnea likely related to dynamic airway collapse and air trapping.  Clearing of secretions better with Acapella still not optimal.  Discussed using CPAP/BiPAP nocturnally but would like to hold off on that.  Took only 2 doses of 2.5 mg morphine for breathlessness yesterday.  Objective   Blood pressure (!) 91/47, pulse 80, temperature (!) 97.3 F (36.3 C), temperature source Oral, resp. rate 19, height 5\' 10"  (1.778 m), weight 64.5 kg, SpO2 97%.    SpO2: 97 % O2 Flow Rate (L/min): 2 L/min FiO2 (%): 36 %    Intake/Output Summary (Last 24 hours) at 08/29/2023 1311 Last data filed at 08/29/2023 0900 Gross per 24 hour  Intake 360 ml   Output 650 ml  Net -290 ml   Filed Weights   08/25/23 1616  Weight: 64.5 kg   Examination: GENERAL: Well-developed, chronically ill-appearing, debilitated gentleman lying in bed using pursed lip breathing.  Supplemental oxygen in place. HEAD: Normocephalic, atraumatic.  EYES: Pupils equal, round, reactive to light.  No scleral icterus.  MOUTH: Multiple missing teeth, oral mucosa moist.  No thrush. NECK: Supple. No thyromegaly. Trachea midline. No JVD.  No adenopathy. PULMONARY: Poor air entry bilaterally.  No end expiratory wheezing, I E ratio 1:3.  Some rhonchi heard.  Positive Hoover's sign. CARDIOVASCULAR: S1 and S2. Regular rate and rhythm.  No rubs, murmurs or gallops heard. ABDOMEN: Protuberant, mild thoracoabdominal asynchrony however less pronounced. MUSCULOSKELETAL: No joint deformity, no clubbing, no edema.  Significant sarcopenia/muscle mass loss noted. NEUROLOGIC: Very debilitated with no overt focal deficit noted. SKIN: Intact,warm,dry. PSYCH: Flat affect, cooperative.  Assessment & Plan:  Acute on chronic hypoxic respiratory failure D/T severe COPD exacerbation,D/T influenza AH1 Hx: GOLD 3 severe COPD Continue oxygen supplementation to maintain sats between 88 to 92%. Continue bronchodilators Optimized with Brovana/Pulmicort and Yupelri Continue as needed albuterol Continue pulmonary hygiene with flutter valve, add chest vest physiotherapy Continue steroids due to patient's chronic steroid dependence Taper of steroids slowly over 2-week period to 10 mg baseline Palliative Medicine has connected with patient, following Debilitating dyspnea trial using low-dose of needed MSIR  Acute on chronic heart failure with preserved ejection fraction Decompensation likely due to increased work of breathing Management per hospitalist team Patient on diuretics Monitor renal function and electrolytes Supplement electrolytes as needed  Paroxysmal atrial fibrillation  Patient  on Tikosyn which limits choice of antibiotics as needed Continue to monitor Continue Eliquis  End-of-life discussion Patient is DNR He is on ongoing discussion with Palliative Medicine   Labs   CBC: Recent Labs  Lab 08/23/23 1325 08/24/23 0430  WBC 13.5* 5.6  NEUTROABS 12.2*  --   HGB 13.6 11.8*  HCT 43.0 36.0*  MCV 96.6 93.0  PLT 304 249    Basic Metabolic Panel: Recent Labs  Lab 08/23/23 1325 08/24/23 0430 08/25/23 0527 08/26/23 0807 08/28/23 0937 08/29/23 0505  NA 142 139 137 136  --  139  K 4.0 3.6 3.9 4.3 4.2 4.9  CL 95* 94* 96* 98  --  98  CO2 36* 31 31 30   --  34*  GLUCOSE 147* 162* 179* 172*  --  119*  BUN 23 26* 40* 49*  --  34*  CREATININE 0.93 1.00 1.10 1.12  --  0.88  CALCIUM 9.1 8.4* 8.6* 8.8*  --  8.4*  MG  --  2.5* 2.4 2.3  --   --    GFR: Estimated Creatinine Clearance: 59 mL/min (by C-G formula based on SCr of 0.88 mg/dL). Recent Labs  Lab 08/23/23 1325 08/23/23 2010 08/24/23 0430  PROCALCITON <0.10  --   --   WBC 13.5*  --  5.6  LATICACIDVEN 1.2 1.3  --     Liver Function Tests: Recent Labs  Lab 08/23/23 1325  AST 25  ALT 18  ALKPHOS 61  BILITOT 0.4  PROT 6.9  ALBUMIN 3.6   No results for input(s): "LIPASE", "AMYLASE" in the last 168 hours. No results for input(s): "AMMONIA" in the last 168 hours.  ABG    Component Value Date/Time   HCO3 45.2 (H) 08/23/2023 1325   O2SAT 52.4 08/23/2023 1325     Coagulation Profile: No results for input(s): "INR", "PROTIME" in the last 168 hours.  Cardiac Enzymes: No results for input(s): "CKTOTAL", "CKMB", "CKMBINDEX", "TROPONINI" in the last 168 hours.  HbA1C: Hgb A1c MFr Bld  Date/Time Value Ref Range Status  07/15/2023 11:14 AM 4.8 4.6 - 6.5 % Final    Comment:    Glycemic Control Guidelines for People with Diabetes:Non Diabetic:  <6%Goal of Therapy: <7%Additional Action Suggested:  >8%   08/13/2022 01:29 AM 5.0 4.8 - 5.6 % Final    Comment:    (NOTE) Pre diabetes:           5.7%-6.4%  Diabetes:              >6.4%  Glycemic control for   <7.0% adults with diabetes     CBG: No results for input(s): "GLUCAP" in the last 168 hours.  Review of Systems:   A 10 point review of systems was performed and it is as noted above otherwise negative.  Past Medical History:  He,  has a past medical history of Abdominal discomfort (12/07/2020), Acquired trigger finger of right middle finger (02/04/2020), Acute prostatitis (04/08/2018), Alcohol abuse, Alcohol dependence (HCC) (12/07/2020), Anal or rectal pain (04/08/2018), Annual physical exam (12/07/2020), Anorectal disorder (12/07/2020), Arthritis, Atherosclerotic heart disease of native coronary artery without angina pectoris (04/08/2018), Atrial flutter (HCC), Benign prostatic hyperplasia with lower urinary tract symptoms (12/07/2020), BPH with elevated PSA, Cancer (HCC), Carotid arterial disease (HCC), Chronic obstructive pulmonary disease with (acute) exacerbation (HCC) (12/07/2020), Chronic respiratory failure with hypoxia (HCC) (08/10/2019), Chronic sinusitis (12/07/2020), Cigarette nicotine dependence, uncomplicated (04/09/2018), Coagulation disorder (HCC) (01/13/2019), 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), 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), Contracture of  palmar fascia (12/07/2020), COPD (chronic obstructive pulmonary disease) (HCC), Corn of toe (12/07/2020), Corns and callosities (04/09/2018), Coronary artery disease, Coronary atherosclerosis (05/07/2018), Degenerative disc disease, 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), Degenerative disc disease, lumbar (10/02/2021), Diarrhea (04/09/2018), Double vision with both eyes open (12/07/2020), 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), Dysphagia (08/14/2018), Dyspnea, ED (erectile dysfunction), ED (erectile dysfunction) of organic origin (12/07/2020), Elevated prostate specific antigen (PSA) (04/08/2018), Elevated PSA, Encounter for screening for other disorder (12/07/2020), Enlarged  prostate without lower urinary tract symptoms (luts) (04/09/2018), Enthesopathy (12/07/2020), Essential (primary) hypertension (04/08/2018), Ex-smoker (04/09/2018), Exertional dyspnea (04/02/2018), 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), Flatulence (04/08/2018), Gastro-esophageal reflux disease without esophagitis (04/08/2018), GERD (gastroesophageal reflux disease), History of acute otitis externa (08/14/2018), History of echocardiogram, Hyperlipidemia, Hypertension, Hypoxemia (12/07/2020), Kidney cysts, Left knee pain (12/07/2020), Low back pain (04/08/2018), Male erectile dysfunction (04/09/2018), Mixed hyperlipidemia (04/08/2018), Nocturia more than twice per night (04/09/2018), Osteoarthritis of first carpometacarpal joint (04/08/2018), Osteoarthritis of knee (12/07/2020), Other long term (current) drug therapy (12/07/2020), Pain due to onychomycosis of toenail of left foot (07/20/2019), Pain in joint (04/09/2018), Pain in knee (04/09/2018), Pain in unspecified knee (12/07/2020), Pain of finger (04/09/2018), Palpitations, 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HCC) (12/07/2020), Personal history of colonic polyps (12/07/2020), Pneumonia, Pneumothorax (04/12/2020), Polypharmacy (04/09/2018), Porokeratosis (01/13/2019), 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HCC), 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   Surgical History:   Past Surgical History:  Procedure Laterality  Date   BRONCHIAL BIOPSY  10/13/2019   Procedure: BRONCHIAL BIOPSIES;  Surgeon: Leslye Peer, MD;  Location: [facility] ENDOSCOPY;  Service: Pulmonary;;   BRONCHIAL BIOPSY  04/12/2020   Procedure: BRONCHIAL BIOPSIES;  Surgeon: Leslye Peer, MD;  Location: [facility] ENDOSCOPY;  Service: Pulmonary;;   BRONCHIAL BRUSHINGS  10/13/2019   Procedure: BRONCHIAL BRUSHINGS;  Surgeon: Leslye Peer, MD;  Location: [facility] ENDOSCOPY;  Service: Pulmonary;;   BRONCHIAL BRUSHINGS  04/12/2020   Procedure: BRONCHIAL BRUSHINGS;  Surgeon: Leslye Peer, MD;  Location: [facility] ENDOSCOPY;  Service: Pulmonary;;   BRONCHIAL NEEDLE ASPIRATION BIOPSY  10/13/2019   Procedure: BRONCHIAL NEEDLE ASPIRATION BIOPSIES;  Surgeon: Leslye Peer, MD;  Location: [facility] ENDOSCOPY;  Service: Pulmonary;;   BRONCHIAL NEEDLE ASPIRATION BIOPSY  04/12/2020   Procedure: BRONCHIAL NEEDLE ASPIRATION BIOPSIES;  Surgeon: Leslye Peer, MD;  Location: [facility] ENDOSCOPY;  Service: Pulmonary;;   BRONCHIAL WASHINGS  10/13/2019   Procedure: BRONCHIAL WASHINGS;  Surgeon: Leslye Peer, MD;  Location: [facility] ENDOSCOPY;  Service: Pulmonary;;   BRONCHIAL WASHINGS  04/12/2020   Procedure: BRONCHIAL WASHINGS;  Surgeon: Leslye Peer, MD;  Location: [facility] ENDOSCOPY;  Service: Pulmonary;;   CARDIAC CATHETERIZATION  2002   50% RCA   CARDIOVERSION N/A 09/17/2022   Procedure: CARDIOVERSION;  Surgeon: Iran Ouch, MD;  Location: [facility] ORS;  Service: Cardiovascular;  Laterality: N/A;   CATARACT EXTRACTION, BILATERAL     CORONARY STENT INTERVENTION N/A 04/15/2018   Procedure: CORONARY STENT INTERVENTION;  Surgeon: Corky Crafts, MD;  Location: [facility] INVASIVE CV LAB;  Service: Cardiovascular;  Laterality: N/A;  om1   EYE SURGERY     KNEE ARTHROSCOPY     LEFT HEART CATH AND CORONARY ANGIOGRAPHY N/A 04/15/2018   Procedure: LEFT HEART CATH  AND CORONARY ANGIOGRAPHY;  Surgeon: Corky Crafts, MD;  Location: [facility] INVASIVE CV LAB;  Service: Cardiovascular;  Laterality: N/A;    MULTIPLE TOOTH EXTRACTIONS     TONSILLECTOMY     VIDEO BRONCHOSCOPY  04/12/2020   VIDEO BRONCHOSCOPY WITH ENDOBRONCHIAL NAVIGATION N/A 07/26/2016   Procedure: VIDEO BRONCHOSCOPY WITH ENDOBRONCHIAL NAVIGATION;  Surgeon: Leslye Peer, MD;  Location: [facility] OR;  Service: Thoracic;  Laterality: N/A;   VIDEO BRONCHOSCOPY WITH ENDOBRONCHIAL NAVIGATION N/A 10/13/2019   Procedure: [facility] AND VIDEO BRONCHOSCOPY WITH ENDOBRONCHIAL NAVIGATION;  Surgeon: Leslye Peer, MD;  Location: [facility] ENDOSCOPY;  Service: Pulmonary;  Laterality: N/A;   VIDEO BRONCHOSCOPY WITH ENDOBRONCHIAL NAVIGATION N/A 04/12/2020   Procedure: VIDEO BRONCHOSCOPY WITH ENDOBRONCHIAL NAVIGATION;  Surgeon: Leslye Peer, MD;  Location: [facility] ENDOSCOPY;  Service: Pulmonary;  Laterality: N/A;     Social History:   reports that he quit smoking about 3 years ago. His smoking use included cigarettes. He started smoking about 71 years ago. He has a 51 pack-year smoking history. He has never been exposed to tobacco smoke. He has never used smokeless tobacco. He reports that he does not drink alcohol and does not use drugs.   Family History:  His family history includes Alzheimer's disease in his mother; Hypertension in his mother.   Allergies Allergies  Allergen Reactions   Flagyl [Metronidazole] Other (See Comments)    Other reaction(s): skin reaction     Home Medications  Prior to Admission medications   Medication Sig Start Date End Date Taking? Authorizing Provider  acetaminophen (TYLENOL) 325 MG tablet Take 650 mg by mouth every 4 (four) hours as needed.   Yes [provider]  albuterol (VENTOLIN HFA) 108 (90 Base) MCG/ACT inhaler INHALE 2 PUFFS 2-3 TIMES A DAY AS NEEDED FOR WHEEZING (30 DAYS) 30 DAY(S) 07/01/23  Yes [name], MD  apixaban (ELIQUIS) 5 MG TABS tablet Take 1 tablet (5 mg total) by mouth 2 (two) times daily. 08/01/23  Yes [name], MD  Budeson-Glycopyrrol-Formoterol (BREZTRI AEROSPHERE) 160-9-4.8  MCG/ACT AERO Inhale 2 puffs into the lungs 2 (two) times daily. 08/01/23  Yes [name], MD  clonazePAM (KLONOPIN) 0.5 MG tablet TAKE 1 TABLET BY MOUTH AT BEDTIME. 08/02/23  Yes [name], MD  diltiazem (CARDIZEM CD) 120 MG 24 hr capsule Take 1 capsule (120 mg total) by mouth 2 (two) times daily. 08/30/22  Yes Furth, Cadence H, PA-C  Docusate Calcium (STOOL SOFTENER PO) Take 1 tablet by mouth every evening.   Yes [provider]  dofetilide (TIKOSYN) 250 MCG capsule Take 1 capsule (250 mcg total) by mouth 2 (two) times daily. 03/21/23  Yes Sherie Don, NP  finasteride (PROSCAR) 5 MG tablet Take 5 mg by mouth daily.   Yes [provider]  furosemide (LASIX) 20 MG tablet Take 2 tablets (40 mg total) by mouth as directed. Take 2 tablets in the morning (40 mg) for 4 days. 07/12/23  Yes Sherie Don, NP  gabapentin (NEURONTIN) 100 MG capsule Take 200 mg by mouth at bedtime. 07/26/21  Yes [provider]  ipratropium (ATROVENT) 0.03 % nasal spray Place 2 sprays into both nostrils every 12 (twelve) hours. 10/19/22  Yes Leslye Peer, MD  ipratropium-albuterol (DUONEB) 0.5-2.5 (3) MG/3ML SOLN INHALE 3 ML BY NEBULIZER EVERY 6 HOURS AS NEEDED 11/29/22  Yes Byrum, Les Pou, MD  nitroGLYCERIN (NITROSTAT) 0.4 MG SL tablet Place 1 tablet (0.4 mg total) under the tongue every 5 (five) minutes as  needed for chest pain. 07/13/21  Yes Revankar, Aundra Dubin, MD  pantoprazole (PROTONIX) 40 MG tablet Take 1 tablet (40 mg total) by mouth daily. 03/13/23  Yes [name], MD  polyethylene glycol (MIRALAX / GLYCOLAX) 17 g packet Take 17 g by mouth daily as needed for moderate constipation.   Yes [provider]  predniSONE (DELTASONE) 5 MG tablet Take 2 tablets (10 mg total) by mouth daily with breakfast. Patient taking differently: Take 7.5 mg by mouth daily with breakfast. 06/26/23 06/25/24 Yes Dgayli, Lianne Bushy, MD  rosuvastatin (CRESTOR) 20 MG tablet Take 1 tablet (20 mg  total) by mouth daily. 04/16/23  Yes Sherie Don, NP  cefpodoxime (VANTIN) 200 MG tablet Take 1 tablet (200 mg total) by mouth 2 (two) times daily for 7 days. Patient not taking: Reported on 08/23/2023 08/22/23 08/29/23  Raechel Chute, MD  OXYGEN Inhale into the lungs. 3-4 liters upon exertion and 3 liters continuous at night.    [provider]  predniSONE (DELTASONE) 20 MG tablet Take 2 tablets (40 mg total) by mouth daily. For 3 days, then 1 tab daily for 3 days 08/20/23   [name], MD  silver sulfADIAZINE (SILVADENE) 1 % cream Apply 1 Application topically daily. 09/20/22   Tillman Abide I, MD  sodium chloride HYPERTONIC 3 % nebulizer solution Take by nebulization in the morning and at bedtime. 06/26/23 06/25/24  Raechel Chute, MD    Scheduled Meds:  apixaban  5 mg Oral BID   arformoterol  15 mcg Nebulization BID   budesonide (PULMICORT) nebulizer solution  0.5 mg Nebulization BID   clonazePAM  0.5 mg Oral QHS   diltiazem  120 mg Oral BID   docusate sodium  100 mg Oral BID   dofetilide  250 mcg Oral BID   feeding supplement  237 mL Oral BID BM   finasteride  5 mg Oral Daily   fluticasone  2 spray Each Nare Daily   furosemide  40 mg Oral Daily   gabapentin  200 mg Oral QHS   pantoprazole  40 mg Oral Daily   polyethylene glycol  17 g Oral Daily   predniSONE  40 mg Oral Q breakfast   revefenacin  175 mcg Nebulization Daily   rosuvastatin  20 mg Oral Daily   senna  2 tablet Oral QHS   Continuous Infusions:   PRN Meds:.acetaminophen **OR** acetaminophen, albuterol, morphine CONCENTRATE, ondansetron **OR** ondansetron (ZOFRAN) IV   Level 2 follow-up    Patient's wife at bedside, updated both the patient and wife.   I spent 35 minutes of dedicated to the care of this patient on the date of this encounter to include pre-visit review of records, face-to-face time with the patient discussing conditions above, post visit ordering of testing, clinical documentation  with the electronic health record, making appropriate referrals as documented, and communicating necessary findings to members of the patients care team.   C. Danice Goltz, MD Advanced Bronchoscopy PCCM [facility]    *This note was generated using voice recognition software/Dragon and/or AI transcription program.  Despite best efforts to proofread, errors can occur which can change the meaning. Any transcriptional errors that result from this process are unintentional and may not be fully corrected at the time of dictation.

## 2023-08-29 NOTE — Plan of Care (Signed)
  Problem: Clinical Measurements: Goal: Ability to maintain clinical measurements within normal limits will improve Outcome: Progressing Goal: Cardiovascular complication will be avoided Outcome: Progressing   Problem: Nutrition: Goal: Adequate nutrition will be maintained Outcome: Progressing   Problem: Pain Managment: Goal: General experience of comfort will improve and/or be controlled Outcome: Progressing   Problem: Skin Integrity: Goal: Risk for impaired skin integrity will decrease Outcome: Progressing

## 2023-08-29 NOTE — Progress Notes (Signed)
Physical Therapy Treatment Patient Details Name: [name] [name]. MRN: [id number] DOB: 02/09/1941 Today's Date: 08/29/2023   History of Present Illness Pt is an 83 y.o. male presenting to hospital 08/23/23 with c/o worsening SOB.  Pt admitted with acute on chronic hypoxic and hypercapnic respiratory failure, acute COPD exacerbation, acute on chronic HFpEF decompensation, PAF.  (+) Influenza.  PMH includes COPD on home O2, pulmonary nodular disease, CAD, htn, HLD, a-fib, Tikosyn, and GERD.    PT Comments  Pt received in bed continues to demonstrate labored breathing relying on accessory muscles. 3L O2 via [place] with O2 sats at 99%. Pt agreed to transfer to EOB with MinA, cues to maintain PLB technique and slow pacing. Prolonged rest break sitting with increased SOB, increased to 4L O2 per pt request, however SpO2 remained in upper 90's-100%. Pt educated on concerns of CO2 retention with fair understanding. Therapist called nursing to administer po morphine per pt request due to high level of anxiety. Pt left sitting upright in chair and slightly more relaxed post meds. Overall, good strength throughout with largest struggle affecting progress being from a respiratory standpoint. Will continue to progress as able.    If plan is discharge home, recommend the following: A little help with walking and/or transfers;A little help with bathing/dressing/bathroom;Assistance with cooking/housework;Assist for transportation;Help with stairs or ramp for entrance   Can travel by private vehicle        Equipment Recommendations  None recommended by PT (DME at home)    Recommendations for Other Services       Precautions / Restrictions Precautions Precautions: Fall Restrictions Weight Bearing Restrictions Per Provider Order: No     Mobility  Bed Mobility Overal bed mobility: Needs Assistance Bed Mobility: Supine to Sit     Supine to sit: Min assist, HOB elevated     General bed mobility  comments: Cues to not hold breath during transfers.    Transfers Overall transfer level: Needs assistance Equipment used: None Transfers: Sit to/from Stand Sit to Stand: Min assist           General transfer comment: MinA to raise to standing and maintain standing balance without AD    Ambulation/Gait         Gait velocity: decreased     General Gait Details: limited d/t SOB/increased WOB. HHA to side step to bedside chair and sit.   Stairs             Wheelchair Mobility     Tilt Bed    Modified Rankin (Stroke Patients Only)       Balance Overall balance assessment: Needs assistance Sitting-balance support: No upper extremity supported, Feet supported Sitting balance-[name]: Good Sitting balance - Comments: limited by SOB   Standing balance support: Bilateral upper extremity supported, Reliant on assistive device for balance Standing balance-[name]: Fair Standing balance comment: steady static standing with B UE support on RW                            Cognition Arousal: Alert Behavior During Therapy: WFL for tasks assessed/performed Overall Cognitive Status: Within Functional Limits for tasks assessed                                 General Comments: Increased anziety when SOB        Exercises Other Exercises Other Exercises: Pt educated on  pacing and PLB technique.    General Comments General comments (skin integrity, edema, etc.): L LE gauze wrap in place from skin tear. Pt on 3L O2 this date, increased to 4L upon exertion.      Pertinent Vitals/Pain Pain Assessment Pain Assessment: No/denies pain    Home Living                          Prior Function            PT Goals (current goals can now be found in the care plan section) Acute Rehab PT Goals Patient Stated Goal: to improve breathing and walking    Frequency    Min 1X/week      PT Plan      Co-evaluation               AM-PAC PT "6 Clicks" Mobility   Outcome Measure  Help needed turning from your back to your side while in a flat bed without using bedrails?: None Help needed moving from lying on your back to sitting on the side of a flat bed without using bedrails?: None Help needed moving to and from a bed to a chair (including a wheelchair)?: A Little Help needed standing up from a chair using your arms (e.g., wheelchair or bedside chair)?: A Little Help needed to walk in hospital room?: A Little Help needed climbing 3-5 steps with a railing? : A Lot 6 Click Score: 19    End of Session Equipment Utilized During Treatment: Gait belt Activity Tolerance: Patient tolerated treatment well Patient left: in chair;with call bell/phone within reach;with nursing/sitter in room Nurse Communication: Mobility status PT Visit Diagnosis: Other abnormalities of gait and mobility (R26.89);Muscle weakness (generalized) (M62.81)     Time: 1610-9604 PT Time Calculation (min) (ACUTE ONLY): 26 min  Charges:    $Therapeutic Activity: 23-37 mins PT General Charges $$ ACUTE PT VISIT: 1 Visit                    Zadie Cleverly, PTA  Jannet Askew 08/29/2023, 2:19 PM

## 2023-08-29 NOTE — Progress Notes (Signed)
Progress Note   Patient: [name] [name]. [id number]:[id number] DOB: 03/01/1941 DOA: 08/23/2023     6 DOS: the patient was seen and examined on 08/29/2023   Brief hospital course: [name] [name]. is a 83 y.o. male with medical history significant of steroid-dependent COPD Gold stage III, chronic hypoxic respiratory failure on 3 to 4 L as needed, HTN, chronic HFpEF, CAD status post stenting in 2019, PAF on Tikosyn and Eliquis presented with worsening of cough wheezing shortness of breath.  Patient is admitted to the hospitalist service for further management evaluation of respiratory failure secondary to acute COPD, CHF exacerbation. Tested positive for Flu A.   Assessment and Plan: Acute on chronic hypoxic and hypercapnic respiratory failure Acute COPD exacerbation Influenza A infection. Remained off BiPAP, currently on 2 L supplemental oxygen. On and off short of breath persists.  Pulmonary advised palliative care. Encourage incentive spirometry, flutter valve. Finished doxycycline Continue oral prednisone therapy 40 Mg daily. Continue ICS, LAMA and LABA. Pulmonary follow up appreciated. DuoNebs scheduled every 4. Morphine as needed ordered. Palliative discussed with him and family, CODE STATUS updated to DNR- limited.  Acute on chronic HFpEF decompensation Continue oral Lasix 40 mg daily. Leg swelling improved. Echo reviewed shows EF 60 to 65%, Monitor daily weights, strict input and output. Continue to monitor renal function, electrolytes.   PAF Continue Tikosyn and Cardizem therapy. Cardiology advised not to stop Tikosyn, QTc not prolonged. Monitor telemetry, EKG as needed. Continue Eliquis therapy.   CAD No chest pain or EKG changes noted. Troponin negative. Continue Eliquis and statin  Active Pressure Injury/Wound(s)     Pressure Ulcer  Duration          Pressure Injury 08/25/23 Buttocks Mid Stage 2 -  Partial thickness loss of dermis presenting as a shallow open  injury with a red, pink wound bed without slough. 3x.5cm 2 days           Continue wound care, constipation regimen. Out of bed to chair. Incentive spirometry. Nursing supportive care. Fall, aspiration precautions. DVT prophylaxis   Code Status: Limited: Do not attempt resuscitation (DNR) -DNR-LIMITED -Do Not Intubate/DNI  Palliative team discussed with patient, wife at bedside. Updated MOST form.  Subjective: Patient is seen and examined today morning.  Short of breath better. Wife at bedside. He is eating fair. Currently on 2-3L supplemental o2. Advised out of bed.  Physical Exam: Vitals:   08/29/23 0424 08/29/23 0833 08/29/23 0839 08/29/23 1153  BP: 138/66 127/84  (!) 91/47  Pulse: 77 76 79 80  Resp: 18 18 18 19   Temp: 98.3 F (36.8 C) 98 F (36.7 C)  (!) 97.3 F (36.3 C)  TempSrc: Oral   Oral  SpO2: 100% 98% 97% 97%  Weight:      Height:        General - Elderly Caucasian ill appearing male, mild respiratory distress HEENT - PERRLA, EOMI, atraumatic head, non tender sinuses. Lung - Clear, basal rales, diffuse wheezes. Heart - S1, S2 heard, no murmurs, rubs, 1+ pedal edema. Abdomen - Soft, non tender, nondistended, bowel sounds good Neuro - Alert, awake and oriented x 3, non focal exam. Skin - Warm and dry.  Data Reviewed:      Latest Ref Rng & Units 08/24/2023    4:30 AM 08/23/2023    1:25 PM 07/15/2023   11:14 AM  CBC  WBC 4.0 - 10.5 K/uL 5.6  13.5  13.4   Hemoglobin 13.0 - 17.0 g/dL 11.8  13.6  12.4   Hematocrit 39.0 - 52.0 % 36.0  43.0  38.3   Platelets 150 - 400 K/uL 249  304  360.0       Latest Ref Rng & Units 08/29/2023    5:05 AM 08/28/2023    9:37 AM 08/26/2023    8:07 AM  BMP  Glucose 70 - 99 mg/dL 161   096   BUN 8 - 23 mg/dL 34   49   Creatinine 0.45 - 1.24 mg/dL 4.09   8.11   Sodium 914 - 145 mmol/L 139   136   Potassium 3.5 - 5.1 mmol/L 4.9  4.2  4.3   Chloride 98 - 111 mmol/L 98   98   CO2 22 - 32 mmol/L 34   30   Calcium 8.9 - 10.3  mg/dL 8.4   8.8    No results found.  Family Communication: Discussed with patient, he understand and agree. All questions answereed.  Disposition: Status is: Inpatient Remains inpatient appropriate because: hypoxia, respiratory distress, copd  Planned Discharge Destination: Home with Home Health     Time spent .  Author: [name], MD 08/29/2023 3:17 PM Secure chat 7am to 7pm For on call review [email or web address].

## 2023-08-29 NOTE — Progress Notes (Signed)
Palliative Care Progress Note, Assessment & Plan   Patient Name: [name] [name].       Date: 08/29/2023 DOB: 08/27/1940  Age: 83 y.o. MRN#: [id number] Attending Physician: [name], MD Primary Care Physician: [name], MD Admit Date: 08/23/2023  Subjective: Patient is sitting up in bed eating a salad from his bedside table.  He is awake, alert, and oriented x 4.  He acknowledges my presence and is able to make his wishes known.  Nasal cannula is in place at 3 L/min.  Patient's wife is at bedside during my visit.  HPI: 83 y.o. male  with past medical history of steroid-dependent COPD Gold stage III, chronic hypoxic respiratory failure on 3 to 4 L as needed, HTN, chronic HFpEF, CAD status post stenting in 2019, PAF on Tikosyn and Eliquis  admitted on 08/23/2023 with shortness of breath, wheezing, and worsening of cough.   Patient is being treated for acute respiratory failure secondary to acute COPD exacerbation and CHF exacerbation.   1/26, patient found to be Influenza A positive.    PMT was consulted to discuss goals of care.  Summary of counseling/coordination of care: Extensive chart review completed prior to meeting patient including labs, vital signs, imaging, progress notes, orders, and available advanced directive documents from current and previous encounters.   After reviewing the patient's chart and assessing the patient at bedside, I spoke with patient in regards to symptom management and goals of care.   Symptoms assessed.  He endorses that the oral moisturizer was helpful but that he has run through the small to rather quickly.  Additional oral moisturizer and 2 sets given to patient.  He has no other acute complaints at this time.  He endorses that his breathing is  less labored than he is feeling better today.  No adjustment to [facility] needed.  I again attempted to elicit values and goals important to the patient.  He remains hopeful that he can continue to feel better and return home.  He also remains realistic that he feels as though he is not going to get much better.  He remains in agreement with DNR with limited interventions and wishes as outlined in MOST form (found in ACP tab of epic).  Symptom burden is low.  Goals are clear.  DNR with limited interventions remains. Plan for STR. TOC following closely.   PMT will step back from daily visits.  Please reengage with PMT if goals change, at patient/family's request, or patient's health deteriorates during this hospitalization.  Patient and family have PMT contact info and were encouraged to reach out with any acute palliative needs during this hospitalization.  Physical Exam Vitals reviewed.  Constitutional:      Appearance: He is normal weight.  HENT:     Head: Normocephalic.  Cardiovascular:     Rate and Rhythm: Normal rate.  Pulmonary:     Breath sounds: Examination of the right-lower field reveals decreased breath sounds. Examination of the left-lower field reveals decreased breath sounds. Decreased breath sounds present.  Musculoskeletal:        General: Normal range of motion.     Comments: MAETC  Skin:    General: Skin  is warm and dry.  Neurological:     Mental Status: He is alert and oriented to person, place, and time.  Psychiatric:        Mood and Affect: Mood is not anxious.        Behavior: Behavior is not agitated.             Total Time 25 minutes   Time spent includes: Detailed review of medical records (labs, imaging, vital signs), medically appropriate exam (mental status, respiratory, cardiac, skin), discussed with treatment team, counseling and educating patient, family and staff, documenting clinical information, medication management and coordination of care.  Samara Deist  L. Bonita Quin, DNP, FNP-BC Palliative Medicine Team

## 2023-08-30 DIAGNOSIS — J441 Chronic obstructive pulmonary disease with (acute) exacerbation: Secondary | ICD-10-CM | POA: Diagnosis not present

## 2023-08-30 DIAGNOSIS — I5033 Acute on chronic diastolic (congestive) heart failure: Secondary | ICD-10-CM | POA: Diagnosis not present

## 2023-08-30 DIAGNOSIS — R531 Weakness: Secondary | ICD-10-CM | POA: Diagnosis not present

## 2023-08-30 DIAGNOSIS — J9621 Acute and chronic respiratory failure with hypoxia: Secondary | ICD-10-CM | POA: Diagnosis not present

## 2023-08-30 LAB — CULTURE, BLOOD (ROUTINE X 2): Culture: NO GROWTH

## 2023-08-30 MED ORDER — PREDNISONE 20 MG PO TABS: 30.0000 mg | ORAL_TABLET | Freq: Every day | ORAL | Status: DC

## 2023-08-30 MED ORDER — ALPRAZOLAM 0.25 MG PO TABS
0.2500 mg | ORAL_TABLET | Freq: Three times a day (TID) | ORAL | Status: DC | PRN
  Administered 2023-08-30: 0.25 mg via ORAL
  Filled 2023-08-30: qty 1

## 2023-08-30 MED ORDER — ALUM & MAG HYDROXIDE-SIMETH 200-200-20 MG/5ML PO SUSP
30.0000 mL | Freq: Four times a day (QID) | ORAL | Status: DC | PRN
  Administered 2023-08-30: 30 mL via ORAL
  Filled 2023-08-30: qty 30

## 2023-08-30 MED ORDER — ORAL CARE MOUTH RINSE: 15.0000 mL | OROMUCOSAL | Status: DC | PRN

## 2023-08-30 NOTE — Progress Notes (Signed)
Progress Note   Patient: [name] [name]. WUJ:[id number] DOB: [birth year] DOA: 08/23/2023     7 DOS: the patient was seen and examined on 08/30/2023   Brief hospital course: [name] [name]. is a 83 y.o. male with medical history significant of steroid-dependent COPD Gold stage III, chronic hypoxic respiratory failure on 3 to 4 L as needed, HTN, chronic HFpEF, CAD status post stenting in 2019, PAF on Tikosyn and Eliquis presented with worsening of cough wheezing shortness of breath.   Patient is admitted to the hospitalist service for further management evaluation of respiratory failure secondary to acute COPD, CHF exacerbation. Tested positive for Flu A.  Patient is weaned off BiPAP, still very short of breath, pulmonary evaluated him advised to continue current treatment, palliative care consultation.  Palliative evaluated him made him DNR with limited interventions.  Patient is realistic of his clinical condition and would like to seek hospice services if needed in future.  Patient would like to go to SNF, insurance authorization pending.  Assessment and Plan: Acute on chronic hypoxic and hypercapnic respiratory failure Acute COPD exacerbation Influenza A infection. Remained off BiPAP, currently on 2 L supplemental oxygen. On and off shortness of breath persists.  Pulmonary advised palliative care. Encourage incentive spirometry, flutter valve. Finished doxycycline. Continue oral prednisone therapy 40 Mg daily taper upon dc. Continue ICS, LAMA and LABA. Pulmonary follow up appreciated. DuoNebs scheduled every 4. Morphine as needed ordered. Morphine PRN, anxiolytics as needed ordered. Palliative discussed with him and family, CODE STATUS updated to DNR- limited. He is very weak, planning to go [facility].  Acute on chronic HFpEF decompensation Continue oral Lasix 40 mg daily. Leg swelling improved. Echo reviewed shows EF 60 to 65%, Monitor daily weights, strict  input and output. Continue to monitor renal function, electrolytes.   PAF Continue Tikosyn and Cardizem therapy. Cardiology advised not to stop Tikosyn, QTc not prolonged. Monitor telemetry, EKG as needed. Continue Eliquis therapy.   CAD No chest pain or EKG changes noted. Troponin negative. Continue Eliquis and statin  Active Pressure Injury/Wound(s)     Pressure Ulcer  Duration          Pressure Injury 08/25/23 Buttocks Mid Stage 2 -  Partial thickness loss of dermis presenting as a shallow open injury with a red, pink wound bed without slough. 3x.5cm 2 days           Continue wound care, constipation regimen. Out of bed to chair. Incentive spirometry. Nursing supportive care. Fall, aspiration precautions. DVT prophylaxis   Code Status: Limited: Do not attempt resuscitation (DNR) -DNR-LIMITED -Do Not Intubate/DNI  Palliative team discussed with patient, wife at bedside. Updated MOST form.  Subjective: Patient is seen and examined today morning.  Short of breath better but on and off struggles breathing. Thinks anxiety causing some of his symptoms. Wife at bedside. He is eating fair. Saturating well on 3L supplemental o2.   Physical Exam: Vitals:   08/29/23 2350 08/30/23 0330 08/30/23 0837 08/30/23 1215  BP: 133/71 123/63 134/67 123/61  Pulse: 76 75 71 70  Resp: 20 18 16 16   Temp: 97.7 F (36.5 C) 98.4 F (36.9 C) 97.6 F (36.4 C) 97.6 F (36.4 C)  TempSrc: Oral Oral  Oral  SpO2: 100% 100% 100% 100%  Weight:      Height:        General - Elderly Caucasian ill appearing male, mild respiratory distress HEENT - PERRLA, EOMI, atraumatic head, non tender sinuses. Lung -  Clear, basal rales, diffuse wheezes. Heart - S1, S2 heard, no murmurs, rubs, 1+ pedal edema. Abdomen - Soft, non tender, nondistended, bowel sounds good Neuro - Alert, awake and oriented x 3, non focal exam. Skin - Warm and dry.  Data Reviewed:      Latest Ref Rng & Units 08/24/2023    4:30  AM 08/23/2023    1:25 PM 07/15/2023   11:14 AM  CBC  WBC 4.0 - 10.5 K/uL 5.6  13.5  13.4   Hemoglobin 13.0 - 17.0 g/dL 16.1  09.6  04.5   Hematocrit 39.0 - 52.0 % 36.0  43.0  38.3   Platelets 150 - 400 K/uL 249  304  360.0       Latest Ref Rng & Units 08/29/2023    5:05 AM 08/28/2023    9:37 AM 08/26/2023    8:07 AM  BMP  Glucose 70 - 99 mg/dL 409   811   BUN 8 - 23 mg/dL 34   49   Creatinine 9.14 - 1.24 mg/dL 7.82   9.56   Sodium 213 - 145 mmol/L 139   136   Potassium 3.5 - 5.1 mmol/L 4.9  4.2  4.3   Chloride 98 - 111 mmol/L 98   98   CO2 22 - 32 mmol/L 34   30   Calcium 8.9 - 10.3 mg/dL 8.4   8.8    No results found.  Family Communication: Discussed with patient, wife at bedside they understand and agree. All questions answereed.  Disposition: Status is: Inpatient Remains inpatient appropriate because: hypoxia, respiratory distress, copd  Planned Discharge Destination: Skilled nursing facility pending insurance auth     Time spent .  Author: [name], MD 08/30/2023 3:10 PM Secure chat 7am to 7pm For on call review [email or web address].

## 2023-08-30 NOTE — Consult Note (Addendum)
  [facility] Liaison Consult Note  08/30/2023  Elis Sauber 1940-08-16 [id number]  Primary Care Provider:  Tillman Abide, MD-[facility].  Patient is currently active with Care Management for chronic disease management services.  Patient has been engaged by a Presenter, broadcasting.  Our community based plan of care has focused on disease management and community resource support.   Plan: pending discharge disposition for recommended SNF level of care. If pt d/ischarged to an affiliated SNF will collaborate with the [facility] PAC-RN on pt's discharge disposition. If pt is discharged to a non-affiliated SNF the facility will continue to address his needs.  Inpatient Transition Of Care [TOC] team member to make aware that Care Management following. Liaison also collaborated with [facility] RNCM on pt's discharge disposition.  Of note, Care Management services does not replace or interfere with any services that are needed or arranged by inpatient [facility] care management team.   For additional questions or referrals please contact:    Elliot Cousin, RN, [facility] Liaison [place]   [facility], Population Health Office Hours MTWF  8:00 am-6:00 pm Direct Dial: [phone] mobile [phone] [Office toll free line] Office Hours are M-F 8:30 - 5 pm [email or web address]

## 2023-08-30 NOTE — Progress Notes (Signed)
NAME:  [name] [name]., MRN:  [id number], DOB:  October 14, 1940, LOS: 7 ADMISSION DATE:  08/23/2023, CONSULTATION DATE:  08/25/2023 by Dr. Eugenia Mcalpine MD: [name], MD CHIEF COMPLAINT: Follow-up requested, exacerbation of COPD  History of Present Illness:  83 y.o. male with medical history significant of steroid-dependent COPD Gold stage III, chronic hypoxic respiratory failure on 3 to 4 L as needed, HTN, chronic HFpEF, CAD status post stenting in 2019, PAF on Tikosyn and Eliquis    Presented with worsening of cough wheezing shortness of breath.  Patient is admitted to the hospitalist service for further management evaluation of respiratory failure secondary to acute COPD, CHF exacerbation.    Patient placed on biPAP, weaned off at this time Remains very SOB, using accessory muscles to breathe   PCCM asked to see patient  Pertinent  Medical History  COPD GOLD 3E (suspect now GOLD 4) last PFTs 2019 Excessive dynamic airway collapse (EDAC) Chronic steroid-dependent Chronic respiratory failure with hypoxia on supplemental O2 Chronic heart failure with preserved ejection fraction Paroxysmal atrial fibrillation on Tikosyn and Eliquis  Interim History / Subjective:  Feels better today has been using vest and Acapella better clearance of secretions.  Took only 1 dose of 2.5 mg morphine (MSIR) yesterday for breathlessness.  Desires to go home today to skilled nursing facility. Objective   Blood pressure 134/67, pulse 71, temperature 97.6 F (36.4 C), resp. rate 16, height 5\' 10"  (1.778 m), weight 64.5 kg, SpO2 100%.    SpO2: 100 % O2 Flow Rate (L/min): 3 L/min FiO2 (%): 36 %    Intake/Output Summary (Last 24 hours) at 08/30/2023 0840 Last data filed at 08/29/2023 1956 Gross per 24 hour  Intake 460 ml  Output 500 ml  Net -40 ml   Filed Weights   08/25/23 1616  Weight: 64.5 kg   Examination: GENERAL: Well-developed, chronically ill-appearing, debilitated gentleman  lying in bed using pursed lip breathing.  Supplemental oxygen in place. HEAD: Normocephalic, atraumatic.  EYES: Pupils equal, round, reactive to light.  No scleral icterus.  MOUTH: Multiple missing teeth, oral mucosa moist.  No thrush. NECK: Supple. No thyromegaly. Trachea midline. No JVD.  No adenopathy. PULMONARY: Poor air entry bilaterally.  No end expiratory wheezing, I E ratio 1:3.  Some rhonchi heard.  Positive Hoover's sign. CARDIOVASCULAR: S1 and S2. Regular rate and rhythm.  No rubs, murmurs or gallops heard. ABDOMEN: Protuberant, mild thoracoabdominal asynchrony however less pronounced. MUSCULOSKELETAL: No joint deformity, no clubbing, no edema.  Significant sarcopenia/muscle mass loss noted. NEUROLOGIC: Very debilitated with no overt focal deficit noted. SKIN: Intact,warm,dry. PSYCH: Flat affect, cooperative.  Assessment & Plan:  Acute on chronic hypoxic respiratory failure D/T severe COPD exacerbation, D/T influenza AH1 Hx: GOLD 3E severe COPD Continue oxygen supplementation to maintain sats between 88 to 92%. Continue bronchodilators at discharge Continue with Brovana/Pulmicort and Yupelri Continue as needed albuterol via nebulizer Continue pulmonary hygiene with flutter valve Continue chest vest physiotherapy twice daily to 3 times daily if available at SNF Continue steroids due to patient's chronic steroid dependence Taper of steroids slowly over 2-week period to 10 mg baseline Palliative Medicine has connected with patient, following Low-dose (2.5 mg) as needed MSIR for dyspnea.  Acute on chronic heart failure with preserved ejection fraction Decompensation likely due to increased work of breathing Management per hospitalist team Patient on diuretics Monitor renal function and electrolytes Supplement electrolytes as needed  Paroxysmal atrial fibrillation Patient on Tikosyn which limits choice of antibiotics as needed Continue  to monitor Continue  Eliquis  End-of-life discussion Patient is DNR He is on ongoing discussion with Palliative Medicine Anticipated discharge today to skilled nursing   Labs   CBC: Recent Labs  Lab 08/23/23 1325 08/24/23 0430  WBC 13.5* 5.6  NEUTROABS 12.2*  --   HGB 13.6 11.8*  HCT 43.0 36.0*  MCV 96.6 93.0  PLT 304 249    Basic Metabolic Panel: Recent Labs  Lab 08/23/23 1325 08/24/23 0430 08/25/23 0527 08/26/23 0807 08/28/23 0937 08/29/23 0505  NA 142 139 137 136  --  139  K 4.0 3.6 3.9 4.3 4.2 4.9  CL 95* 94* 96* 98  --  98  CO2 36* 31 31 30   --  34*  GLUCOSE 147* 162* 179* 172*  --  119*  BUN 23 26* 40* 49*  --  34*  CREATININE 0.93 1.00 1.10 1.12  --  0.88  CALCIUM 9.1 8.4* 8.6* 8.8*  --  8.4*  MG  --  2.5* 2.4 2.3  --   --    GFR: Estimated Creatinine Clearance: 59 mL/min (by C-G formula based on SCr of 0.88 mg/dL). Recent Labs  Lab 08/23/23 1325 08/23/23 2010 08/24/23 0430  PROCALCITON <0.10  --   --   WBC 13.5*  --  5.6  LATICACIDVEN 1.2 1.3  --     Liver Function Tests: Recent Labs  Lab 08/23/23 1325  AST 25  ALT 18  ALKPHOS 61  BILITOT 0.4  PROT 6.9  ALBUMIN 3.6   No results for input(s): "LIPASE", "AMYLASE" in the last 168 hours. No results for input(s): "AMMONIA" in the last 168 hours.  ABG    Component Value Date/Time   HCO3 45.2 (H) 08/23/2023 1325   O2SAT 52.4 08/23/2023 1325     Coagulation Profile: No results for input(s): "INR", "PROTIME" in the last 168 hours.  Cardiac Enzymes: No results for input(s): "CKTOTAL", "CKMB", "CKMBINDEX", "TROPONINI" in the last 168 hours.  HbA1C: Hgb A1c MFr Bld  Date/Time Value Ref Range Status  07/15/2023 11:14 AM 4.8 4.6 - 6.5 % Final    Comment:    Glycemic Control Guidelines for People with Diabetes:Non Diabetic:  <6%Goal of Therapy: <7%Additional Action Suggested:  >8%   08/13/2022 01:29 AM 5.0 4.8 - 5.6 % Final    Comment:    (NOTE) Pre diabetes:          5.7%-6.4%  Diabetes:               >6.4%  Glycemic control for   <7.0% adults with diabetes     CBG: No results for input(s): "GLUCAP" in the last 168 hours.  Review of Systems:   A 10 point review of systems was performed and it is as noted above otherwise negative.  Past Medical History:  He,  has a past medical history of Abdominal discomfort (12/07/2020), Acquired trigger finger of right middle finger (02/04/2020), Acute prostatitis (04/08/2018), Alcohol abuse, Alcohol dependence (HCC) (12/07/2020), Anal or rectal pain (04/08/2018), Annual physical exam (12/07/2020), Anorectal disorder (12/07/2020), Arthritis, Atherosclerotic heart disease of native coronary artery without angina pectoris (04/08/2018), Atrial flutter (HCC), Benign prostatic hyperplasia with lower urinary tract symptoms (12/07/2020), BPH with elevated PSA, Cancer (HCC), Carotid arterial disease (HCC), Chronic obstructive pulmonary disease with (acute) exacerbation (HCC) (12/07/2020), Chronic respiratory failure with hypoxia (HCC) (08/10/2019), Chronic sinusitis (12/07/2020), Cigarette nicotine dependence, uncomplicated (04/09/2018), Coagulation disorder (HCC) (01/13/2019), 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), 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), Contracture of palmar fascia (12/07/2020), COPD (chronic  obstructive pulmonary disease) (HCC), Corn of toe (12/07/2020), Corns and callosities (04/09/2018), Coronary artery disease, Coronary atherosclerosis (05/07/2018), Degenerative disc disease, 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), Degenerative disc disease, lumbar (10/02/2021), Diarrhea (04/09/2018), Double vision with both eyes open (12/07/2020), 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), Dysphagia (08/14/2018), Dyspnea, ED (erectile dysfunction), ED (erectile dysfunction) of organic origin (12/07/2020), Elevated prostate specific antigen (PSA) (04/08/2018), Elevated PSA, Encounter for screening for other disorder (12/07/2020), Enlarged prostate without lower urinary tract  symptoms (luts) (04/09/2018), Enthesopathy (12/07/2020), Essential (primary) hypertension (04/08/2018), Ex-smoker (04/09/2018), Exertional dyspnea (04/02/2018), 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), Flatulence (04/08/2018), Gastro-esophageal reflux disease without esophagitis (04/08/2018), GERD (gastroesophageal reflux disease), History of acute otitis externa (08/14/2018), History of echocardiogram, Hyperlipidemia, Hypertension, Hypoxemia (12/07/2020), Kidney cysts, Left knee pain (12/07/2020), Low back pain (04/08/2018), Male erectile dysfunction (04/09/2018), Mixed hyperlipidemia (04/08/2018), Nocturia more than twice per night (04/09/2018), Osteoarthritis of first carpometacarpal joint (04/08/2018), Osteoarthritis of knee (12/07/2020), Other long term (current) drug therapy (12/07/2020), Pain due to onychomycosis of toenail of left foot (07/20/2019), Pain in joint (04/09/2018), Pain in knee (04/09/2018), Pain in unspecified knee (12/07/2020), Pain of finger (04/09/2018), Palpitations, 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HCC) (12/07/2020), Personal history of colonic polyps (12/07/2020), Pneumonia, Pneumothorax (04/12/2020), Polypharmacy (04/09/2018), Porokeratosis (01/13/2019), 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HCC), 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   Surgical History:   Past Surgical History:  Procedure Laterality Date   BRONCHIAL BIOPSY  10/13/2019    Procedure: BRONCHIAL BIOPSIES;  Surgeon: Leslye Peer, MD;  Location: [facility] ENDOSCOPY;  Service: Pulmonary;;   BRONCHIAL BIOPSY  04/12/2020   Procedure: BRONCHIAL BIOPSIES;  Surgeon: Leslye Peer, MD;  Location: [facility] ENDOSCOPY;  Service: Pulmonary;;   BRONCHIAL BRUSHINGS  10/13/2019   Procedure: BRONCHIAL BRUSHINGS;  Surgeon: Leslye Peer, MD;  Location: [facility] ENDOSCOPY;  Service: Pulmonary;;   BRONCHIAL BRUSHINGS  04/12/2020   Procedure: BRONCHIAL BRUSHINGS;  Surgeon: Leslye Peer, MD;  Location: [facility] ENDOSCOPY;  Service: Pulmonary;;   BRONCHIAL NEEDLE ASPIRATION BIOPSY  10/13/2019   Procedure: BRONCHIAL NEEDLE ASPIRATION BIOPSIES;  Surgeon: Leslye Peer, MD;  Location: [facility] ENDOSCOPY;  Service: Pulmonary;;   BRONCHIAL NEEDLE ASPIRATION BIOPSY  04/12/2020   Procedure: BRONCHIAL NEEDLE ASPIRATION BIOPSIES;  Surgeon: Leslye Peer, MD;  Location: [facility] ENDOSCOPY;  Service: Pulmonary;;   BRONCHIAL WASHINGS  10/13/2019   Procedure: BRONCHIAL WASHINGS;  Surgeon: Leslye Peer, MD;  Location: [facility] ENDOSCOPY;  Service: Pulmonary;;   BRONCHIAL WASHINGS  04/12/2020   Procedure: BRONCHIAL WASHINGS;  Surgeon: Leslye Peer, MD;  Location: [facility] ENDOSCOPY;  Service: Pulmonary;;   CARDIAC CATHETERIZATION  2002   50% RCA   CARDIOVERSION N/A 09/17/2022   Procedure: CARDIOVERSION;  Surgeon: Iran Ouch, MD;  Location: [facility] ORS;  Service: Cardiovascular;  Laterality: N/A;   CATARACT EXTRACTION, BILATERAL     CORONARY STENT INTERVENTION N/A 04/15/2018   Procedure: CORONARY STENT INTERVENTION;  Surgeon: Corky Crafts, MD;  Location: [facility] INVASIVE CV LAB;  Service: Cardiovascular;  Laterality: N/A;  om1   EYE SURGERY     KNEE ARTHROSCOPY     LEFT HEART CATH AND CORONARY ANGIOGRAPHY N/A 04/15/2018   Procedure: LEFT HEART CATH AND CORONARY ANGIOGRAPHY;  Surgeon:  Corky Crafts, MD;  Location[facility] INV[facility] CV LAB;  Service: Cardiovascular;  Laterality: N/A;   MULTIPLE TOOTH EXTRACTIONS     TONSILLECTOMY      VIDEO BRONCHOSCOPY  04/12/2020   VIDEO BRONCHOSCOPY WITH ENDOBRONCHIAL NAVIGATION N/A 07/26/2016   Procedure: VIDEO BRONCHOSCOPY WITH ENDOBRONCHIAL NAVIGATION;  Surgeon: Leslye Peer, MD;  Location: [facility] OR;  Service: Thoracic;  Laterality: N/A;   VIDEO BRONCHOSCOPY WITH ENDOBRONCHIAL NAVIGATION N/A 10/13/2019   Procedure: [facility] AND VIDEO BRONCHOSCOPY WITH ENDOBRONCHIAL NAVIGATION;  Surgeon: Leslye Peer, MD;  Location: [facility] ENDOSCOPY;  Service: Pulmonary;  Laterality: N/A;   VIDEO BRONCHOSCOPY WITH ENDOBRONCHIAL NAVIGATION N/A 04/12/2020   Procedure: VIDEO BRONCHOSCOPY WITH ENDOBRONCHIAL NAVIGATION;  Surgeon: Leslye Peer, MD;  Location: [facility] ENDOSCOPY;  Service: Pulmonary;  Laterality: N/A;     Social History:   reports that he quit smoking about 3 years ago. His smoking use included cigarettes. He started smoking about 71 years ago. He has a 51 pack-year smoking history. He has never been exposed to tobacco smoke. He has never used smokeless tobacco. He reports that he does not drink alcohol and does not use drugs.   Family History:  His family history includes Alzheimer's disease in his mother; Hypertension in his mother.   Allergies Allergies  Allergen Reactions   Flagyl [Metronidazole] Other (See Comments)    Other reaction(s): skin reaction     Home Medications  Prior to Admission medications   Medication Sig Start Date End Date Taking? Authorizing Provider  acetaminophen (TYLENOL) 325 MG tablet Take 650 mg by mouth every 4 (four) hours as needed.   Yes [provider]  albuterol (VENTOLIN HFA) 108 (90 Base) MCG/ACT inhaler INHALE 2 PUFFS 2-3 TIMES A DAY AS NEEDED FOR WHEEZING (30 DAYS) 30 DAY(S) 07/01/23  Yes [name], MD  apixaban (ELIQUIS) 5 MG TABS tablet Take 1 tablet (5 mg total) by mouth 2 (two) times daily. 08/01/23  Yes [name], MD  Budeson-Glycopyrrol-Formoterol (BREZTRI AEROSPHERE) 160-9-4.8 MCG/ACT AERO Inhale 2 puffs into the lungs 2 (two)  times daily. 08/01/23  Yes [name], MD  clonazePAM (KLONOPIN) 0.5 MG tablet TAKE 1 TABLET BY MOUTH AT BEDTIME. 08/02/23  Yes [name], MD  diltiazem (CARDIZEM CD) 120 MG 24 hr capsule Take 1 capsule (120 mg total) by mouth 2 (two) times daily. 08/30/22  Yes Furth, Cadence H, PA-C  Docusate Calcium (STOOL SOFTENER PO) Take 1 tablet by mouth every evening.   Yes [provider]  dofetilide (TIKOSYN) 250 MCG capsule Take 1 capsule (250 mcg total) by mouth 2 (two) times daily. 03/21/23  Yes Sherie Don, NP  finasteride (PROSCAR) 5 MG tablet Take 5 mg by mouth daily.   Yes [provider]  furosemide (LASIX) 20 MG tablet Take 2 tablets (40 mg total) by mouth as directed. Take 2 tablets in the morning (40 mg) for 4 days. 07/12/23  Yes Sherie Don, NP  gabapentin (NEURONTIN) 100 MG capsule Take 200 mg by mouth at bedtime. 07/26/21  Yes [provider]  ipratropium (ATROVENT) 0.03 % nasal spray Place 2 sprays into both nostrils every 12 (twelve) hours. 10/19/22  Yes Leslye Peer, MD  ipratropium-albuterol (DUONEB) 0.5-2.5 (3) MG/3ML SOLN INHALE 3 ML BY NEBULIZER EVERY 6 HOURS AS NEEDED 11/29/22  Yes Byrum, Les Pou, MD  nitroGLYCERIN (NITROSTAT) 0.4 MG SL tablet Place 1 tablet (0.4 mg total) under the tongue every 5 (five) minutes as needed for chest pain. 07/13/21  Yes Revankar, Aundra Dubin, MD  pantoprazole (PROTONIX) 40 MG tablet Take 1 tablet (40 mg total) by mouth daily. 03/13/23  Yes [name], MD  polyethylene glycol (MIRALAX / GLYCOLAX) 17 g packet Take 17 g by mouth daily as needed for moderate constipation.   Yes [provider]  predniSONE (DELTASONE) 5 MG tablet Take 2 tablets (10 mg total) by mouth daily with breakfast. Patient taking differently: Take 7.5 mg by mouth daily with breakfast. 06/26/23 06/25/24 Yes Dgayli, Lianne Bushy, MD  rosuvastatin (CRESTOR) 20 MG tablet Take 1 tablet (20 mg total) by mouth daily. 04/16/23  Yes Sherie Don, NP   cefpodoxime (VANTIN) 200 MG tablet Take 1 tablet (200 mg total) by mouth 2 (two) times daily for 7 days. Patient not taking: Reported on 08/23/2023 08/22/23 08/29/23  Raechel Chute, MD  OXYGEN Inhale into the lungs. 3-4 liters upon exertion and 3 liters continuous at night.    [provider]  predniSONE (DELTASONE) 20 MG tablet Take 2 tablets (40 mg total) by mouth daily. For 3 days, then 1 tab daily for 3 days 08/20/23   [name], MD  silver sulfADIAZINE (SILVADENE) 1 % cream Apply 1 Application topically daily. 09/20/22   Tillman Abide I, MD  sodium chloride HYPERTONIC 3 % nebulizer solution Take by nebulization in the morning and at bedtime. 06/26/23 06/25/24  Raechel Chute, MD    Scheduled Meds:  apixaban  5 mg Oral BID   arformoterol  15 mcg Nebulization BID   budesonide (PULMICORT) nebulizer solution  0.5 mg Nebulization BID   clonazePAM  0.5 mg Oral QHS   diltiazem  120 mg Oral BID   docusate sodium  100 mg Oral BID   dofetilide  250 mcg Oral BID   feeding supplement  237 mL Oral BID BM   finasteride  5 mg Oral Daily   fluticasone  2 spray Each Nare Daily   furosemide  40 mg Oral Daily   gabapentin  200 mg Oral QHS   pantoprazole  40 mg Oral Daily   polyethylene glycol  17 g Oral Daily   [START ON 09/02/2023] predniSONE  30 mg Oral Q breakfast   predniSONE  40 mg Oral Q breakfast   revefenacin  175 mcg Nebulization Daily   rosuvastatin  20 mg Oral Daily   senna  2 tablet Oral QHS   Continuous Infusions:   PRN Meds:.acetaminophen **OR** acetaminophen, albuterol, morphine CONCENTRATE, ondansetron **OR** ondansetron (ZOFRAN) IV   Level 2 follow-up    Discussed in detail with patient.  Will arrange for follow-up with Dr. Raechel Chute in 2 to 3 weeks time.  PCCM will sign off please reconsult as needed.   I spent 35 minutes of dedicated to the care of this patient on the date of this encounter to include pre-visit review of records, face-to-face time with  the patient discussing conditions above, post visit ordering of testing, clinical documentation with the electronic health record, making appropriate referrals as documented, and communicating necessary findings to members of the patients care team.   C. Danice Goltz, MD Advanced Bronchoscopy PCCM [facility]    *This note was generated using voice recognition software/Dragon and/or AI transcription program.  Despite best efforts to proofread, errors can occur which can change the meaning. Any transcriptional errors that result from this process are unintentional and may not be fully corrected at the time of dictation.

## 2023-08-30 NOTE — TOC Progression Note (Signed)
Transition of Care (TOC) - Progression Note    Patient Details  Name: [name] [name]. MRN: [id number] Date of Birth: 06-13-1941  Transition of Care [facility]) CM/SW Contact  Truddie Hidden, RN Phone Number: 08/30/2023, 11:01 AM  Clinical Narrative:    Spoke with patient and his wife regarding discharge plan and therapy's recommendation for STR at SNF. They are agreeable to SNF. Patient lives at [facility] and would like [facility]. They have been advised [facility] will accept them when bed is available and if authorization from insurance is approved.   FL2 completed. Referral sent to [facility]. [employer] auth started.          Expected Discharge Plan and Services                                               Social Determinants of Health (SDOH) Interventions SDOH Screenings   Food Insecurity: No Food Insecurity (08/25/2023)  Housing: Low Risk  (08/25/2023)  Transportation Needs: No Transportation Needs (08/25/2023)  Utilities: Not At Risk (08/25/2023)  Depression (PHQ2-9): Low Risk  (07/15/2023)  Financial Resource Strain: Low Risk  (01/06/2023)  Physical Activity: Sufficiently Active (01/06/2023)  Social Connections: Unknown (08/25/2023)  Stress: No Stress Concern Present (01/06/2023)  Tobacco Use: Medium Risk (08/21/2023)    Readmission Risk Interventions     No data to display

## 2023-08-30 NOTE — [place] FL2 (Signed)
[place] MEDICAID FL2 LEVEL OF CARE FORM     IDENTIFICATION  Patient Name: [name] [name]. Birthdate: [birth year] Sex: male Admission Date (Current Location): 08/23/2023  [place] and [place] Number:  Chiropodist and Address:  [facility], [address] [place], [place] [address]      Provider Number: [id number]  Attending Physician Name and Address:  [name], MD  Relative Name and Phone Number:  Mask,Susan (Spouse)  [phone] (Mobile)    Current Level of Care: Hospital Recommended Level of Care: Skilled Nursing Facility Prior Approval Number:    Date Approved/Denied:   PASRR Number: [id number]  Discharge Plan:      Current Diagnoses: Patient Active Problem List   Diagnosis Date Noted   Palliative care by specialist 08/28/2023   Pressure injury of skin 08/28/2023   Conjunctival hemorrhage, right 08/21/2023   Preventative health care 07/15/2023   Chronic renal disease, stage III (HCC) 07/15/2023   COPD with acute exacerbation (HCC) 05/31/2023   Hyperglycemia 05/31/2023   Hypercoagulable state due to persistent atrial fibrillation (HCC) 03/14/2023   Paroxysmal atrial fibrillation (HCC) 01/07/2023   Peripheral neuropathy 11/26/2022   Vasomotor rhinitis 10/19/2022   Lower extremity edema 10/01/2022   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) 09/09/2022   Oxygen dependent 08/20/2022   Persistent atrial fibrillation (HCC) 08/20/2022   Atrial flutter (HCC) 08/15/2022   Acute on chronic respiratory failure with hypoxia (HCC) 08/12/2022   Anxiety 08/12/2022   Degenerative disc disease, 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   Degenerative disc disease, 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   Chronic sinusitis 12/07/2020   Chronic anticoagulation 12/07/2020   Osteoarthritis of knee 12/07/2020   Benign prostatic hyperplasia with lower urinary tract symptoms 12/07/2020   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   Renal cyst 12/07/2020   Chronic  obstructive pulmonary disease with (acute) exacerbation (HCC) 12/07/2020   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HCC) 12/07/2020   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HCC)    Coronary artery disease    GERD (gastroesophageal reflux disease)    Kidney cysts    Aspiration pneumonia (HCC)    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   Chronic respiratory failure with hypoxia (HCC) 08/10/2019   Coagulation disorder (HCC) 01/13/2019   Dysphagia 08/14/2018   Coronary atherosclerosis 05/07/2018   Corns and callosities 04/09/2018   Uncomplicated alcohol dependence (HCC) 04/09/2018   Vitamin D deficiency 04/08/2018   Osteoarthritis of first carpometacarpal joint 04/08/2018   Gastro-esophageal reflux disease without esophagitis 04/08/2018   Mixed hyperlipidemia 04/08/2018   Atherosclerotic heart disease of native coronary artery without angina pectoris 04/08/2018   Essential (primary) hypertension 04/08/2018   Sleep disorder 04/08/2018   Pulmonary nodules 06/08/2016   COPD (chronic obstructive pulmonary disease) (HCC) 06/08/2016    Orientation RESPIRATION BLADDER Height & Weight     Self, Time, Situation, Place  Normal External catheter, Continent Weight: 64.5 kg Height:  5\' 10"  (177.8 cm)  BEHAVIORAL SYMPTOMS/MOOD NEUROLOGICAL BOWEL NUTRITION STATUS  Other (Comment)  (n/a) Continent Diet (2 gram sodium)  AMBULATORY STATUS COMMUNICATION OF NEEDS Skin     Verbally PU Stage and Appropriate Care, Other (Comment) (eecyhmosis L leg, Bilateral arms, Erythema coccyx Stage 2 to buttocks dressing q3days, skin tear L tibia gauze dressing  q3 days, toe eschar)                       Personal Care Assistance Level of Assistance  Bathing, Dressing Bathing Assistance: Limited assistance   Dressing Assistance: Limited assistance  Functional Limitations Info  Sight, Hearing Sight Info: Impaired Hearing Info: Adequate      SPECIAL CARE FACTORS FREQUENCY  PT (By licensed PT), OT (By licensed OT)     PT Frequency:  Min 2x weekly OT Frequency: Min 2x weekly            Contractures Contractures Info: Not present    Additional Factors Info  Code Status, Allergies Code Status Info: DNR Allergies Info: Flagyl (Metronidazole)           Current Medications (08/30/2023):  This is the current hospital active medication list Current Facility-Administered Medications  Medication Dose Route Frequency Provider Last Rate Last Admin   acetaminophen (TYLENOL) tablet 650 mg  650 mg Oral Q6H PRN Mikey College T, MD       Or   acetaminophen (TYLENOL) suppository 650 mg  650 mg Rectal Q6H PRN Mikey College T, MD       albuterol (PROVENTIL) (2.5 MG/3ML) 0.083% nebulizer solution 2.5 mg  2.5 mg Nebulization Q4H PRN Salena Saner, MD   2.5 mg at 08/28/23 1619   apixaban (ELIQUIS) tablet 5 mg  5 mg Oral BID Mikey College T, MD   5 mg at 08/30/23 0939   arformoterol (BROVANA) nebulizer solution 15 mcg  15 mcg Nebulization BID Salena Saner, MD   15 mcg at 08/30/23 0833   budesonide (PULMICORT) nebulizer solution 0.5 mg  0.5 mg Nebulization BID Erin Fulling, MD   0.5 mg at 08/30/23 1308   clonazePAM (KLONOPIN) tablet 0.5 mg  0.5 mg Oral QHS Mikey College T, MD   0.5 mg at 08/29/23 2010   diltiazem (CARDIZEM CD) 24 hr capsule 120 mg  120 mg Oral BID Mikey College T, MD   120 mg at 08/30/23 6578   docusate sodium (COLACE) capsule 100 mg  100 mg Oral BID [name], MD   100 mg at 08/30/23 4696   dofetilide (TIKOSYN) capsule 250 mcg  250 mcg Oral BID Mikey College T, MD   250 mcg at 08/30/23 2952   feeding supplement (ENSURE ENLIVE / ENSURE PLUS) liquid 237 mL  237 mL Oral BID BM Sreenath, Sudheer B, MD   237 mL at 08/30/23 0940   finasteride (PROSCAR) tablet 5 mg  5 mg Oral Daily Mikey College T, MD   5 mg at 08/30/23 0940   fluticasone (FLONASE) 50 MCG/ACT nasal spray 2 spray  2 spray Each Nare Daily Mikey College T, MD   2 spray at 08/30/23 0942   furosemide (LASIX) tablet 40 mg  40 mg Oral Daily [name], MD   40 mg at 08/30/23 0939   gabapentin (NEURONTIN) capsule 200 mg  200 mg Oral QHS Mikey College T, MD   200 mg at 08/29/23 2020   morphine CONCENTRATE 10 mg / 0.5 ml oral solution 2.6 mg  2.6 mg Oral Q4H PRN Salena Saner, MD   2.6 mg at 08/29/23 2020   ondansetron (ZOFRAN) tablet 4 mg  4 mg Oral Q6H PRN Mikey College T, MD       Or   ondansetron [facility]) injection 4 mg  4 mg Intravenous Q6H PRN Mikey College T, MD       pantoprazole (PROTONIX) EC tablet 40 mg  40 mg Oral Daily Mikey College T, MD   40 mg at 08/30/23 0939   polyethylene glycol (MIRALAX / GLYCOLAX) packet 17 g  17 g Oral Daily [name], MD   17 g at 08/29/23  1016   [START ON 09/02/2023] predniSONE (DELTASONE) tablet 30 mg  30 mg Oral Q breakfast Sreeram, Narendranath, MD       predniSONE (DELTASONE) tablet 40 mg  40 mg Oral Q breakfast [name], MD   40 mg at 08/30/23 0825   revefenacin (YUPELRI) nebulizer solution 175 mcg  175 mcg Nebulization Daily Salena Saner, MD   175 mcg at 08/30/23 2130   rosuvastatin (CRESTOR) tablet 20 mg  20 mg Oral Daily Mikey College T, MD   20 mg at 08/30/23 8657   senna (SENOKOT) tablet 17.2 mg  2 tablet Oral QHS [name], MD   17.2 mg at 08/29/23 2010     Discharge Medications: Please see discharge summary for a list of discharge medications.  Relevant Imaging Results:  Relevant Lab Results:   Additional Information SSN: [id number]  Truddie Hidden, RN

## 2023-08-30 NOTE — Plan of Care (Signed)
  Problem: Clinical Measurements: Goal: Ability to maintain clinical measurements within normal limits will improve Outcome: Progressing   Problem: Activity: Goal: Risk for activity intolerance will decrease Outcome: Progressing   Problem: Coping: Goal: Level of anxiety will decrease Outcome: Progressing   

## 2023-08-31 ENCOUNTER — Other Ambulatory Visit: Payer: Self-pay | Admitting: Medical

## 2023-08-31 DIAGNOSIS — J441 Chronic obstructive pulmonary disease with (acute) exacerbation: Secondary | ICD-10-CM | POA: Diagnosis not present

## 2023-08-31 DIAGNOSIS — J9601 Acute respiratory failure with hypoxia: Secondary | ICD-10-CM | POA: Diagnosis not present

## 2023-08-31 DIAGNOSIS — J9602 Acute respiratory failure with hypercapnia: Secondary | ICD-10-CM | POA: Diagnosis not present

## 2023-08-31 NOTE — Progress Notes (Signed)
Progress Note   Patient: [name] [name]. ZOX:[id number] DOB: Feb 15, 1941 DOA: 08/23/2023     8 DOS: the patient was seen and examined on 08/31/2023     Brief hospital course: [name]. is a 83 y.o. male with medical history significant of steroid-dependent COPD Gold stage III, chronic hypoxic respiratory failure on 3 to 4 L as needed, HTN, chronic HFpEF, CAD status post stenting in 2019, PAF on Tikosyn and Eliquis presented with worsening of cough wheezing shortness of breath.   Patient is admitted to the hospitalist service for further management evaluation of respiratory failure secondary to acute COPD, CHF exacerbation. Tested positive for Flu A.  Patient is weaned off BiPAP, still very short of breath, pulmonary evaluated him advised to continue current treatment, palliative care consultation.  Palliative evaluated him made him DNR with limited interventions.  Patient is realistic of his clinical condition and would like to seek hospice services if needed in future.  Patient would like to go to SNF, insurance authorization pending.   Assessment and Plan: Acute on chronic hypoxic and hypercapnic respiratory failure Acute COPD exacerbation Influenza A infection. Remained off BiPAP, currently on 2 L supplemental oxygen. On and off shortness of breath persists.  Pulmonary advised palliative care. Encourage incentive spirometry, flutter valve. Completed doxycycline Continue oral prednisone therapy 40 Mg daily taper upon dc. Continue ICS, LAMA and LABA. Pulmonary follow up appreciated. DuoNebs scheduled every 4. Morphine as needed ordered. Morphine PRN, anxiolytics as needed ordered. Palliative discussed with him and family, CODE STATUS updated to DNR- limited. He is very weak, planning to go [facility].   Acute on chronic HFpEF decompensation Continue oral Lasix 40 mg daily. Leg swelling improved. Echo reviewed shows EF 60 to 65%, Monitor daily weights,  strict input and output. Continue to monitor renal function, electrolytes.   PAF Continue Tikosyn and Cardizem therapy. Cardiology advised not to stop Tikosyn, QTc not prolonged. Monitor telemetry, EKG as needed. Continue Eliquis   CAD No chest pain or EKG changes noted. Troponin negative. Continue Eliquis and statin   Active Pressure Injury/Wound(s)       Pressure Ulcer  Duration             Pressure Injury 08/25/23 Buttocks Mid Stage 2 -  Partial thickness loss of dermis presenting as a shallow open injury with a red, pink wound bed without slough. 3x.5cm 2 days                Continue wound care, constipation regimen. Out of bed to chair. Incentive spirometry. Nursing supportive care. Fall, aspiration precautions. DVT prophylaxis   Code Status: Limited: Do not attempt resuscitation    Physical Exam:   General - Elderly Caucasian laying in bed on intranasal oxygen HEENT - PERRLA, EOMI, atraumatic head, non tender sinuses. Lung - Clear, basal rales, diffuse wheezes. Heart - S1, S2 heard, no murmurs, rubs, 1+ pedal edema. Abdomen - Soft, non tender, nondistended, bowel sounds good Neuro - Alert, awake and oriented x 3, non focal exam. Skin - Warm and dry.     Subjective:  Patient seen and examined at bedside this morning in the presence of the wife Continues to await placement Currently on 3 L of intranasal oxygen which he uses at baseline Denies chest pain nausea vomiting   Data Reviewed:    Latest Ref Rng & Units 08/24/2023    4:30 AM 08/23/2023    1:25 PM 07/15/2023   11:14 AM  CBC  WBC 4.0 - 10.5 K/uL 5.6  13.5  13.4   Hemoglobin 13.0 - 17.0 g/dL 40.9  81.1  91.4   Hematocrit 39.0 - 52.0 % 36.0  43.0  38.3   Platelets 150 - 400 K/uL 249  304  360.0        Latest Ref Rng & Units 08/29/2023    5:05 AM 08/28/2023    9:37 AM 08/26/2023    8:07 AM  BMP  Glucose 70 - 99 mg/dL 782   956   BUN 8 - 23 mg/dL 34   49   Creatinine 2.13 - 1.24 mg/dL 0.86    5.78   Sodium 469 - 145 mmol/L 139   136   Potassium 3.5 - 5.1 mmol/L 4.9  4.2  4.3   Chloride 98 - 111 mmol/L 98   98   CO2 22 - 32 mmol/L 34   30   Calcium 8.9 - 10.3 mg/dL 8.4   8.8     Vitals:   08/31/23 0831 08/31/23 0859 08/31/23 1213 08/31/23 1535  BP:  (!) 147/65 95/61 105/69  Pulse:  75 75 82  Resp:  18 20 18   Temp:  97.9 F (36.6 C) (!) 97.5 F (36.4 C) 98.1 F (36.7 C)  TempSrc:  Oral Oral Oral  SpO2: 98% 100% 100% 100%  Weight:      Height:         Author: Loyce Dys, MD 08/31/2023 4:15 PM  For on call review [email or web address].

## 2023-08-31 NOTE — Plan of Care (Signed)

## 2023-08-31 NOTE — Progress Notes (Signed)
Physical Therapy Treatment Patient Details Name: [name] [name]. MRN: [id number] DOB: 03/13/1941 Today's Date: 08/31/2023   History of Present Illness Pt is an 83 y.o. male presenting to hospital 08/23/23 with c/o worsening SOB.  Pt admitted with acute on chronic hypoxic and hypercapnic respiratory failure, acute COPD exacerbation, acute on chronic HFpEF decompensation, PAF.  (+) Influenza.  PMH includes COPD on home O2, pulmonary nodular disease, CAD, htn, HLD, a-fib, Tikosyn, and GERD.    PT Comments  Pt politely declining participation with OOB mobility secondary to SOB at rest, but is agreeable for BLE therex. Able to complete x10 reps of heel slides, SLR, hip ABD/ADD, ankle pumps, and glute sets with full AROM and ample rest breaks between sets. SpO2 >/= 95% on 3L, however, pt required max cues for pursed lip breathing instead of breathing through mouth to assist with c/o DOE. Pt will continue to benefit from skilled acute PT services to address deficits for return to baseline function. Will continue per POC.     If plan is discharge home, recommend the following: A little help with walking and/or transfers;A little help with bathing/dressing/bathroom;Assistance with cooking/housework;Assist for transportation;Help with stairs or ramp for entrance   Can travel by private vehicle     No  Equipment Recommendations  None recommended by PT       Precautions / Restrictions Precautions Precautions: Fall Restrictions Weight Bearing Restrictions Per Provider Order: No     Mobility  Bed Mobility               General bed mobility comments: declining OOB mobility secondary to fatigue and DOE; agreeable for bed-level exercises       Balance       Sitting balance - Comments: declining OOB mobility secondary to fatigue and DOE; agreeable for bed-level exercises                                    Cognition Arousal: Alert Behavior During Therapy: WFL for tasks  assessed/performed Overall Cognitive Status: Within Functional Limits for tasks assessed                                          Exercises Total Joint Exercises Ankle Circles/Pumps: AROM, Both, 10 reps Gluteal Sets: AROM, Both, 10 reps Heel Slides: AROM, Both, 10 reps Hip ABduction/ADduction: AROM, Both, 10 reps Straight Leg Raises: AROM, Both, 10 reps Other Exercises Other Exercises: Educated re: PT role/POC, benefits of participation with therapy, pursed lip breathing, breathing through nose and not mouth    General Comments General comments (skin integrity, edema, etc.): SpO2 ranging from 95-100% on 3L; max cues for pursed lip breathing.      Pertinent Vitals/Pain Pain Assessment Pain Assessment: No/denies pain     PT Goals (current goals can now be found in the care plan section) Acute Rehab PT Goals Patient Stated Goal: to improve breathing and walking PT Goal Formulation: With patient Time For Goal Achievement: 09/09/23 Potential to Achieve Goals: Fair Progress towards PT goals: Progressing toward goals    Frequency    Min 1X/week       AM-PAC PT "6 Clicks" Mobility   Outcome Measure  Help needed turning from your back to your side while in a flat bed without using bedrails?: None Help needed moving from  lying on your back to sitting on the side of a flat bed without using bedrails?: None Help needed moving to and from a bed to a chair (including a wheelchair)?: A Little Help needed standing up from a chair using your arms (e.g., wheelchair or bedside chair)?: A Little Help needed to walk in hospital room?: A Little Help needed climbing 3-5 steps with a railing? : A Lot 6 Click Score: 19    End of Session   Activity Tolerance: Patient tolerated treatment well Patient left: in bed;with call bell/phone within reach Nurse Communication: Mobility status PT Visit Diagnosis: Other abnormalities of gait and mobility (R26.89);Muscle weakness  (generalized) (M62.81)     Time: 1345-1400 PT Time Calculation (min) (ACUTE ONLY): 15 min  Charges:    $Therapeutic Exercise: 8-22 mins PT General Charges $$ ACUTE PT VISIT: 1 Visit                       Vira Blanco, PT, DPT 2:05 PM,08/31/23 Physical Therapist - [facility]

## 2023-08-31 NOTE — TOC Progression Note (Addendum)
Transition of Care (TOC) - Progression Note    Patient Details  Name: [name] [name]. MRN: [id number] Date of Birth: Dec 04, 1940  [facility]) CM/SW Contact  Bing Quarry, RN Phone Number: 08/31/2023, 1:33 PM  Clinical Narrative: 08/31/23: Insurance authorization approval #[id number] till 09/03/23. Noted at 130 pm today.   Gabriel Cirri MSN RN CM  RN Case Manager [place]  Transitions of Care Direct Dial: [phone] (Weekends Only) [facility] Main Office Phone: [phone] [facility] Fax: [phone] [email or web address]          Expected Discharge Plan and Services                                               Social Determinants of Health (SDOH) Interventions SDOH Screenings   Food Insecurity: No Food Insecurity (08/25/2023)  Housing: Low Risk  (08/25/2023)  Transportation Needs: No Transportation Needs (08/25/2023)  Utilities: Not At Risk (08/25/2023)  Depression (PHQ2-9): Low Risk  (07/15/2023)  Financial Resource Strain: Low Risk  (01/06/2023)  Physical Activity: Sufficiently Active (01/06/2023)  Social Connections: Unknown (08/25/2023)  Stress: No Stress Concern Present (01/06/2023)  Tobacco Use: Medium Risk (08/21/2023)    Readmission Risk Interventions     No data to display

## 2023-09-01 DIAGNOSIS — I2510 Atherosclerotic heart disease of native coronary artery without angina pectoris: Secondary | ICD-10-CM | POA: Diagnosis not present

## 2023-09-01 DIAGNOSIS — I4811 Longstanding persistent atrial fibrillation: Secondary | ICD-10-CM | POA: Diagnosis not present

## 2023-09-01 DIAGNOSIS — J9621 Acute and chronic respiratory failure with hypoxia: Secondary | ICD-10-CM | POA: Diagnosis not present

## 2023-09-01 DIAGNOSIS — J9622 Acute and chronic respiratory failure with hypercapnia: Secondary | ICD-10-CM | POA: Diagnosis not present

## 2023-09-01 DIAGNOSIS — E441 Mild protein-calorie malnutrition: Secondary | ICD-10-CM | POA: Diagnosis not present

## 2023-09-01 DIAGNOSIS — R2689 Other abnormalities of gait and mobility: Secondary | ICD-10-CM | POA: Diagnosis not present

## 2023-09-01 DIAGNOSIS — Z9981 Dependence on supplemental oxygen: Secondary | ICD-10-CM | POA: Diagnosis not present

## 2023-09-01 DIAGNOSIS — J441 Chronic obstructive pulmonary disease with (acute) exacerbation: Secondary | ICD-10-CM | POA: Diagnosis not present

## 2023-09-01 DIAGNOSIS — J9611 Chronic respiratory failure with hypoxia: Secondary | ICD-10-CM | POA: Diagnosis not present

## 2023-09-01 DIAGNOSIS — F1020 Alcohol dependence, uncomplicated: Secondary | ICD-10-CM | POA: Diagnosis not present

## 2023-09-01 DIAGNOSIS — I4891 Unspecified atrial fibrillation: Secondary | ICD-10-CM | POA: Diagnosis not present

## 2023-09-01 DIAGNOSIS — J9601 Acute respiratory failure with hypoxia: Secondary | ICD-10-CM | POA: Diagnosis not present

## 2023-09-01 DIAGNOSIS — J9602 Acute respiratory failure with hypercapnia: Secondary | ICD-10-CM | POA: Diagnosis not present

## 2023-09-01 DIAGNOSIS — L98429 Non-pressure chronic ulcer of back with unspecified severity: Secondary | ICD-10-CM | POA: Diagnosis not present

## 2023-09-01 DIAGNOSIS — J9612 Chronic respiratory failure with hypercapnia: Secondary | ICD-10-CM | POA: Diagnosis not present

## 2023-09-01 DIAGNOSIS — Z743 Need for continuous supervision: Secondary | ICD-10-CM | POA: Diagnosis not present

## 2023-09-01 DIAGNOSIS — I5033 Acute on chronic diastolic (congestive) heart failure: Secondary | ICD-10-CM | POA: Diagnosis not present

## 2023-09-01 DIAGNOSIS — J431 Panlobular emphysema: Secondary | ICD-10-CM | POA: Diagnosis not present

## 2023-09-01 DIAGNOSIS — I739 Peripheral vascular disease, unspecified: Secondary | ICD-10-CM | POA: Diagnosis not present

## 2023-09-01 DIAGNOSIS — I4892 Unspecified atrial flutter: Secondary | ICD-10-CM | POA: Diagnosis not present

## 2023-09-01 DIAGNOSIS — N183 Chronic kidney disease, stage 3 unspecified: Secondary | ICD-10-CM | POA: Diagnosis not present

## 2023-09-01 DIAGNOSIS — B351 Tinea unguium: Secondary | ICD-10-CM | POA: Diagnosis not present

## 2023-09-01 DIAGNOSIS — Z741 Need for assistance with personal care: Secondary | ICD-10-CM | POA: Diagnosis not present

## 2023-09-01 DIAGNOSIS — I959 Hypotension, unspecified: Secondary | ICD-10-CM | POA: Diagnosis not present

## 2023-09-01 DIAGNOSIS — L97521 Non-pressure chronic ulcer of other part of left foot limited to breakdown of skin: Secondary | ICD-10-CM | POA: Diagnosis not present

## 2023-09-01 DIAGNOSIS — I130 Hypertensive heart and chronic kidney disease with heart failure and stage 1 through stage 4 chronic kidney disease, or unspecified chronic kidney disease: Secondary | ICD-10-CM | POA: Diagnosis not present

## 2023-09-01 DIAGNOSIS — R2681 Unsteadiness on feet: Secondary | ICD-10-CM | POA: Diagnosis not present

## 2023-09-01 DIAGNOSIS — M6281 Muscle weakness (generalized): Secondary | ICD-10-CM | POA: Diagnosis not present

## 2023-09-01 DIAGNOSIS — R278 Other lack of coordination: Secondary | ICD-10-CM | POA: Diagnosis not present

## 2023-09-01 DIAGNOSIS — J449 Chronic obstructive pulmonary disease, unspecified: Secondary | ICD-10-CM | POA: Diagnosis not present

## 2023-09-01 LAB — CBC WITH DIFFERENTIAL/PLATELET
Abs Immature Granulocytes: 0.56 10*3/uL — ABNORMAL HIGH (ref 0.00–0.07)
Basophils Absolute: 0 10*3/uL (ref 0.0–0.1)
Basophils Relative: 0 %
Eosinophils Absolute: 0 10*3/uL (ref 0.0–0.5)
Eosinophils Relative: 0 %
HCT: 36 % — ABNORMAL LOW (ref 39.0–52.0)
Hemoglobin: 12 g/dL — ABNORMAL LOW (ref 13.0–17.0)
Immature Granulocytes: 4 %
Lymphocytes Relative: 5 %
Lymphs Abs: 0.7 10*3/uL (ref 0.7–4.0)
MCH: 30.5 pg (ref 26.0–34.0)
MCHC: 33.3 g/dL (ref 30.0–36.0)
MCV: 91.4 fL (ref 80.0–100.0)
Monocytes Absolute: 0.8 10*3/uL (ref 0.1–1.0)
Monocytes Relative: 6 %
Neutro Abs: 11.3 10*3/uL — ABNORMAL HIGH (ref 1.7–7.7)
Neutrophils Relative %: 85 %
Platelets: 282 10*3/uL (ref 150–400)
RBC: 3.94 MIL/uL — ABNORMAL LOW (ref 4.22–5.81)
RDW: 12.9 % (ref 11.5–15.5)
WBC: 13.4 10*3/uL — ABNORMAL HIGH (ref 4.0–10.5)
nRBC: 0 % (ref 0.0–0.2)

## 2023-09-01 LAB — BASIC METABOLIC PANEL
Anion gap: 7 (ref 5–15)
BUN: 29 mg/dL — ABNORMAL HIGH (ref 8–23)
CO2: 33 mmol/L — ABNORMAL HIGH (ref 22–32)
Calcium: 8.3 mg/dL — ABNORMAL LOW (ref 8.9–10.3)
Chloride: 98 mmol/L (ref 98–111)
Creatinine, Ser: 0.77 mg/dL (ref 0.61–1.24)
GFR, Estimated: >60 mL/min (ref >60)
Glucose, Bld: 117 mg/dL — ABNORMAL HIGH (ref 70–99)
Potassium: 5.2 mmol/L — ABNORMAL HIGH (ref 3.5–5.1)
Sodium: 138 mmol/L (ref 135–145)

## 2023-09-01 MED ORDER — SODIUM ZIRCONIUM CYCLOSILICATE 10 G PO PACK
10.0000 g | PACK | Freq: Once | ORAL | Status: AC
Start: 2023-09-01 — End: 2023-09-01
  Administered 2023-09-01: 10 g via ORAL
  Filled 2023-09-01: qty 1

## 2023-09-01 MED ORDER — PREDNISONE 10 MG PO TABS: ORAL_TABLET | ORAL | Status: AC

## 2023-09-01 NOTE — Progress Notes (Addendum)
AVS reviewed with patient. PIV removed. AVS placed in discharge packet. All questions answered. Report called to [facility]. Patient going to room 109.

## 2023-09-01 NOTE — TOC Transition Note (Addendum)
Transition of Care [facility]) - Discharge Note   Patient Details  Name: [name] [name]. MRN: [id number] Date of Birth: [birth year]  Transition of Care [facility]) CM/SW Contact:  Bing Quarry, RN Phone Number: 09/01/2023, 10:24 AM   Clinical Narrative:  09/01/23: Insurance authorization approval received via Nav[employer] portal and relayed to Admissions Coordinator at [facility] who confirmed patient can admit to facility today. Updated provider and need for DC Summary as soon as ready to be sent via HUB to [facility].   1248 pm. DC Summary inboxed to facility via HUB, Going to Room 109. Report to be called to [phone]. EMS forms excluding DNR form printed to unit and will call EMS when patient ready. Updated provider and unit RN.   Gabriel Cirri MSN RN CM  RN Case Manager [place]  Transitions of Care Direct Dial: [phone] (Weekends Only) [facility] Main Office Phone: [phone] [facility] Fax: [phone] [email or web address]      Final next level of care: Skilled Nursing Facility Barriers to Discharge: Barriers Resolved   Patient Goals and CMS Choice   CMS Medicare.gov Compare Post Acute Care list provided to::  (Patient already a resident of [facility] Ind Living section) Choice offered to / list presented to : Patient, Adult Children (Patient wishes to progress to STR at [facility] where is ia a  current resident in Indepedent Living section per prior CM notes.)      Discharge Placement                Patient to be transferred to facility by: [facility]      Discharge Plan and Services Additional resources added to the After Visit Summary for                  DME Arranged: N/A DME Agency: NA       HH Arranged: NA          Social Drivers of Health (SDOH) Interventions SDOH Screenings   Food Insecurity: No Food Insecurity (08/25/2023)  Housing: Low Risk  (08/25/2023)  Transportation Needs: No Transportation Needs (08/25/2023)  Utilities: Not At Risk (08/25/2023)  Depression  (PHQ2-9): Low Risk  (07/15/2023)  Financial Resource Strain: Low Risk  (01/06/2023)  Physical Activity: Sufficiently Active (01/06/2023)  Social Connections: Unknown (08/25/2023)  Stress: No Stress Concern Present (01/06/2023)  Tobacco Use: Medium Risk (08/21/2023)     Readmission Risk Interventions     No data to display

## 2023-09-01 NOTE — Progress Notes (Signed)
Pharmacy: Dofetilide (Tikosyn) - Follow Up Assessment and Electrolyte Replacement  Pharmacy consulted to assist in monitoring and replacing electrolytes in this 83 y.o. male admitted on 08/23/2023 on dofetilide. Stable home dose resumed, missed doses documented. Qtc < 500.  Labs:    Component Value Date/Time   K 5.2 (H) 09/01/2023 0535   MG 2.3 08/26/2023 0807     Plan: Potassium: K >/= 4: No additional supplementation needed  Magnesium: Mg > 2: No additional supplementation needed  Pt is on lasix 40 mg PO daily.    Paschal Dopp, PharmD, BCPS 09/01/2023 9:48 AM

## 2023-09-01 NOTE — Plan of Care (Signed)

## 2023-09-01 NOTE — Discharge Instructions (Signed)
 [name] [name]

## 2023-09-01 NOTE — Discharge Summary (Signed)
Physician Discharge Summary   Patient: [name] [name]. MRN: [id number] DOB: 23-Jun-1941  Admit date:     08/23/2023  Discharge date: 09/01/23  Discharge Physician: Loyce Dys   PCP: [name], MD   Recommendations at discharge:  Follow-up with PCP and pulmonologist  Discharge Diagnoses:  Acute on chronic hypoxic and hypercapnic respiratory failure Acute COPD exacerbation Influenza A infection. Acute on chronic HFpEF decompensation PAF CAD   Hospital Course: [name] [name]. is a 83 y.o. male with medical history significant of steroid-dependent COPD Gold stage III, chronic hypoxic respiratory failure on 3 to 4 L as needed, HTN, chronic HFpEF, CAD status post stenting in 2019, PAF on Tikosyn and Eliquis presented with worsening of cough wheezing shortness of breath.  Patient was admitted to the hospitalist service for further management evaluation of respiratory failure secondary to acute COPD, CHF exacerbation. Tested positive for Flu A.  Patient is weaned off BiPAP, still very short of breath, pulmonary evaluated him advised to continue current treatment, palliative care consultation.  Palliative evaluated him made him DNR with limited interventions.  Patient is realistic of his clinical condition and would like to seek hospice services if needed in future.  Patient would like to go to SNF.  He will follow-up with PCP and pulmonologist.         Consultants: Pulmonologist Procedures performed: As mentioned above Disposition: Skilled nursing facility Diet recommendation:  Heart healthy DISCHARGE MEDICATION: Allergies as of 09/01/2023       Reactions   Flagyl [metronidazole] Other (See Comments)   Other reaction(s): skin reaction        Medication List     STOP taking these medications    cefpodoxime 200 MG tablet Commonly known as: VANTIN       TAKE these medications    acetaminophen 325 MG tablet Commonly known as: TYLENOL Take 650 mg by  mouth every 4 (four) hours as needed.   albuterol 108 (90 Base) MCG/ACT inhaler Commonly known as: VENTOLIN HFA INHALE 2 PUFFS 2-3 TIMES A DAY AS NEEDED FOR WHEEZING (30 DAYS) 30 DAY(S)   apixaban 5 MG Tabs tablet Commonly known as: ELIQUIS Take 1 tablet (5 mg total) by mouth 2 (two) times daily.   Breztri Aerosphere 160-9-4.8 MCG/ACT Aero Generic drug: Budeson-Glycopyrrol-Formoterol Inhale 2 puffs into the lungs 2 (two) times daily.   clonazePAM 0.5 MG tablet Commonly known as: KLONOPIN TAKE 1 TABLET BY MOUTH AT BEDTIME.   diltiazem 120 MG 24 hr capsule Commonly known as: CARDIZEM CD Take 1 capsule (120 mg total) by mouth 2 (two) times daily.   dofetilide 250 MCG capsule Commonly known as: TIKOSYN Take 1 capsule (250 mcg total) by mouth 2 (two) times daily.   finasteride 5 MG tablet Commonly known as: PROSCAR Take 5 mg by mouth daily.   furosemide 20 MG tablet Commonly known as: LASIX Take 2 tablets (40 mg total) by mouth as directed. Take 2 tablets in the morning (40 mg) for 4 days.   gabapentin 100 MG capsule Commonly known as: NEURONTIN Take 200 mg by mouth at bedtime.   ipratropium 0.03 % nasal spray Commonly known as: ATROVENT Place 2 sprays into both nostrils every 12 (twelve) hours.   ipratropium-albuterol 0.5-2.5 (3) MG/3ML Soln Commonly known as: DUONEB INHALE 3 ML BY NEBULIZER EVERY 6 HOURS AS NEEDED   nitroGLYCERIN 0.4 MG SL tablet Commonly known as: NITROSTAT Place 1 tablet (0.4 mg total) under the tongue every 5 (five) minutes  as needed for chest pain.   OXYGEN Inhale into the lungs. 3-4 liters upon exertion and 3 liters continuous at night.   pantoprazole 40 MG tablet Commonly known as: PROTONIX Take 1 tablet (40 mg total) by mouth daily.   polyethylene glycol 17 g packet Commonly known as: MIRALAX / GLYCOLAX Take 17 g by mouth daily as needed for moderate constipation.   predniSONE 10 MG tablet Commonly known as: DELTASONE Take 4 tablets  (40 mg total) by mouth daily for 3 days, THEN 2 tablets (20 mg total) daily for 3 days, THEN 1 tablet (10 mg total) daily for 3 days. Start taking on: September 01, 2023 What changed:  medication strength See the new instructions. Another medication with the same name was removed. Continue taking this medication, and follow the directions you see here.   rosuvastatin 20 MG tablet Commonly known as: CRESTOR Take 1 tablet (20 mg total) by mouth daily.   silver sulfADIAZINE 1 % cream Commonly known as: SILVADENE Apply 1 Application topically daily.   sodium chloride HYPERTONIC 3 % nebulizer solution Take by nebulization in the morning and at bedtime.   STOOL SOFTENER PO Take 1 tablet by mouth every evening.        Discharge Exam: Filed Weights   08/25/23 1616  Weight: 64.5 kg   General - Elderly Caucasian laying in bed on intranasal oxygen HEENT - PERRLA, EOMI, atraumatic head, non tender sinuses. Lung - Clear, basal rales, diffuse wheezes. Heart - S1, S2 heard, no murmurs, rubs, 1+ pedal edema. Abdomen - Soft, non tender, nondistended, bowel sounds good Neuro - Alert, awake and oriented x 3, non focal exam. Skin - Warm and dry.    Condition at discharge: good  The results of significant diagnostics from this hospitalization (including imaging, microbiology, ancillary and laboratory) are listed below for reference.   Imaging Studies: ECHOCARDIOGRAM COMPLETE Result Date: 08/26/2023    ECHOCARDIOGRAM REPORT   Patient Name:   [name] [name]. Date of Exam: 08/25/2023 Medical Rec #:  [id number]            Height:       70.5 in Accession #:    [id number]           Weight:       155.0 lb Date of Birth:  27-Jan-1941            BSA:          1.883 m Patient Age:    82 years             BP:           139/77 mmHg Patient Gender: M                    HR:           90 bpm. Exam Location:  [facility] Procedure: 2D Echo, Cardiac Doppler and Color Doppler Indications:     Acytre respiratory  distress R06.03  History:         Patient has prior history of Echocardiogram examinations, most                  recent 08/07/2022. CAD, COPD, Arrythmias:Atrial Fibrillation and                  Atrial Flutter; Risk Factors:Hypertension and Dyslipidemia.  Sonographer:     Lucendia Herrlich RCS Referring Phys:  [id number] [name] Diagnosing Phys: Chilton Si MD  Sonographer Comments:  Image acquisition challenging due to respiratory motion. IMPRESSIONS  1. Left ventricular ejection fraction, by estimation, is 60 to 65%. The left ventricle has normal function. The left ventricle has no regional wall motion abnormalities. Left ventricular diastolic parameters are indeterminate.  2. Right ventricular systolic function is normal. The right ventricular size is normal. There is mildly elevated pulmonary artery systolic pressure.  3. The mitral valve is normal in structure. No evidence of mitral valve regurgitation. No evidence of mitral stenosis.  4. The aortic valve is tricuspid. Aortic valve regurgitation is not visualized. No aortic stenosis is present.  5. The inferior vena cava is normal in size with greater than 50% respiratory variability, suggesting right atrial pressure of 3 mmHg. FINDINGS  Left Ventricle: Left ventricular ejection fraction, by estimation, is 60 to 65%. The left ventricle has normal function. The left ventricle has no regional wall motion abnormalities. The left ventricular internal cavity size was normal in size. There is  no left ventricular hypertrophy. Left ventricular diastolic parameters are indeterminate. Indeterminate filling pressures. Right Ventricle: The right ventricular size is normal. No increase in right ventricular wall thickness. Right ventricular systolic function is normal. There is mildly elevated pulmonary artery systolic pressure. The tricuspid regurgitant velocity is 2.97  m/s, and with an assumed right atrial pressure of 3 mmHg, the estimated right ventricular  systolic pressure is 38.3 mmHg. Left Atrium: Left atrial size was normal in size. Right Atrium: Right atrial size was normal in size. Pericardium: There is no evidence of pericardial effusion. Mitral Valve: The mitral valve is normal in structure. No evidence of mitral valve regurgitation. No evidence of mitral valve stenosis. Tricuspid Valve: The tricuspid valve is normal in structure. Tricuspid valve regurgitation is trivial. No evidence of tricuspid stenosis. Aortic Valve: The aortic valve is tricuspid. Aortic valve regurgitation is not visualized. No aortic stenosis is present. Aortic valve peak gradient measures 5.1 mmHg. Pulmonic Valve: The pulmonic valve was normal in structure. Pulmonic valve regurgitation is not visualized. No evidence of pulmonic stenosis. Aorta: The aortic root is normal in size and structure. Venous: The inferior vena cava is normal in size with greater than 50% respiratory variability, suggesting right atrial pressure of 3 mmHg. IAS/Shunts: No atrial level shunt detected by color flow Doppler.  LEFT VENTRICLE PLAX 2D LVIDd:         4.20 cm   Diastology LVIDs:         2.60 cm   LV e' medial:    9.14 cm/s LV PW:         1.10 cm   LV E/e' medial:  10.9 LV IVS:        0.80 cm   LV e' lateral:   8.27 cm/s LVOT diam:     2.00 cm   LV E/e' lateral: 12.1 LV SV:         53 LV SV Index:   28 LVOT Area:     3.14 cm  RIGHT VENTRICLE            IVC RV S prime:     9.79 cm/s  IVC diam: 1.40 cm TAPSE (M-mode): 2.5 cm LEFT ATRIUM         Index LA diam:    3.10 cm 1.65 cm/m  AORTIC VALVE AV Area (Vmax): 2.10 cm AV Vmax:        113.00 cm/s AV Peak Grad:   5.1 mmHg LVOT Vmax:      75.63 cm/s LVOT Vmean:  49.867 cm/s LVOT VTI:       0.168 m  AORTA Ao Root diam: 3.20 cm Ao Asc diam:  3.10 cm MITRAL VALVE                TRICUSPID VALVE MV Area (PHT): 3.77 cm     TR Peak grad:   35.3 mmHg MV Decel Time: 201 msec     TR Vmax:        297.00 cm/s MV E velocity: 100.00 cm/s MV A velocity: 99.00 cm/s    SHUNTS MV E/A ratio:  1.01         Systemic VTI:  0.17 m                             Systemic Diam: 2.00 cm Chilton Si MD Electronically signed by Chilton Si MD Signature Date/Time: 08/26/2023/7:51:48 AM    Final    DG Chest Portable 1 View Result Date: 08/23/2023 CLINICAL DATA:  Shortness of breath EXAM: PORTABLE CHEST 1 VIEW COMPARISON:  Chest radiograph dated 05/31/2023 FINDINGS: Hyperinflated lungs. Asymmetric lucency of the left upper lung. No focal consolidations. No pleural effusion or pneumothorax. The heart size and mediastinal contours are within normal limits. No acute osseous abnormality. IMPRESSION: 1. Hyperinflated lungs with asymmetric lucency of the left upper lung, in keeping with emphysematous changes. 2. No focal consolidations. Electronically Signed   By: Agustin Cree M.D.   On: 08/23/2023 14:30    Microbiology: Results for orders placed or performed during the hospital encounter of 08/23/23  Blood culture (routine x 2)     Status: None   Collection Time: 08/23/23  1:14 PM   Specimen: BLOOD  Result Value Ref Range Status   Specimen Description BLOOD RIGHT ANTECUBITAL  Final   Special Requests   Final    BOTTLES DRAWN AEROBIC AND ANAEROBIC Blood Culture adequate volume   Culture   Final    NO GROWTH 5 DAYS Performed at [facility], [address] [place], [place] [address]    Report Status 08/28/2023 FINAL  Final  Resp panel by RT-PCR (RSV, Flu A&B, Covid) Anterior Nasal Swab     Status: None   Collection Time: 08/23/23  1:25 PM   Specimen: Anterior Nasal Swab  Result Value Ref Range Status   SARS Coronavirus 2 by RT PCR NEGATIVE NEGATIVE Final    Comment: (NOTE) SARS-CoV-2 target nucleic acids are NOT DETECTED.  The SARS-CoV-2 RNA is generally detectable in upper respiratory specimens during the acute phase of infection. The lowest concentration of SARS-CoV-2 viral copies this assay can detect is 138 copies/mL. A negative result does not preclude  SARS-Cov-2 infection and should not be used as the sole basis for treatment or other patient management decisions. A negative result may occur with  improper specimen collection/handling, submission of specimen other than nasopharyngeal swab, presence of viral mutation(s) within the areas targeted by this assay, and inadequate number of viral copies(<138 copies/mL). A negative result must be combined with clinical observations, patient history, and epidemiological information. The expected result is Negative.  Fact Sheet for Patients:  BloggerCourse.com  Fact Sheet for Healthcare Providers:  SeriousBroker.it  This test is no t yet approved or cleared by the [place] FDA and  has been authorized for detection and/or diagnosis of SARS-CoV-2 by FDA under an Emergency Use Authorization (EUA). This EUA will remain  in effect (meaning this test can be used) for the duration  of the COVID-19 declaration under Section 564(b)(1) of the Act, 21 U.S.C.section 360bbb-3(b)(1), unless the authorization is terminated  or revoked sooner.       Influenza A by PCR NEGATIVE NEGATIVE Final   Influenza B by PCR NEGATIVE NEGATIVE Final    Comment: (NOTE) The Xpert Xpress SARS-CoV-2/FLU/RSV plus assay is intended as an aid in the diagnosis of influenza from Nasopharyngeal swab specimens and should not be used as a sole basis for treatment. Nasal washings and aspirates are unacceptable for Xpert Xpress SARS-CoV-2/FLU/RSV testing.  Fact Sheet for Patients: BloggerCourse.com  Fact Sheet for Healthcare Providers: SeriousBroker.it  This test is not yet approved or cleared by the [place] FDA and has been authorized for detection and/or diagnosis of SARS-CoV-2 by FDA under an Emergency Use Authorization (EUA). This EUA will remain in effect (meaning this test can be used) for the duration of  the COVID-19 declaration under Section 564(b)(1) of the Act, 21 U.S.C. section 360bbb-3(b)(1), unless the authorization is terminated or revoked.     Resp Syncytial Virus by PCR NEGATIVE NEGATIVE Final    Comment: (NOTE) Fact Sheet for Patients: BloggerCourse.com  Fact Sheet for Healthcare Providers: SeriousBroker.it  This test is not yet approved or cleared by the [place] FDA and has been authorized for detection and/or diagnosis of SARS-CoV-2 by FDA under an Emergency Use Authorization (EUA). This EUA will remain in effect (meaning this test can be used) for the duration of the COVID-19 declaration under Section 564(b)(1) of the Act, 21 U.S.C. section 360bbb-3(b)(1), unless the authorization is terminated or revoked.  Performed at [facility], [address] [place], [place] [address]   Blood culture (routine x 2)     Status: None   Collection Time: 08/25/23  2:29 PM   Specimen: BLOOD  Result Value Ref Range Status   Specimen Description BLOOD BLOOD LEFT ARM  Final   Special Requests   Final    BOTTLES DRAWN AEROBIC AND ANAEROBIC Blood Culture results may not be optimal due to an inadequate volume of blood received in culture bottles   Culture   Final    NO GROWTH 5 DAYS Performed at [facility], [address] [place], [place] [address]    Report Status 08/30/2023 FINAL  Final  Respiratory (~20 pathogens) panel by PCR     Status: Abnormal   Collection Time: 08/25/23  4:16 PM   Specimen: Nasopharyngeal Swab; Respiratory  Result Value Ref Range Status   Adenovirus NOT DETECTED NOT DETECTED Final   Coronavirus 229E NOT DETECTED NOT DETECTED Final    Comment: (NOTE) The Coronavirus on the Respiratory Panel, DOES NOT test for the novel  Coronavirus (2019 nCoV)    Coronavirus HKU1 NOT DETECTED NOT DETECTED Final   Coronavirus NL63 NOT DETECTED NOT DETECTED Final   Coronavirus OC43 NOT DETECTED  NOT DETECTED Final   Metapneumovirus NOT DETECTED NOT DETECTED Final   Rhinovirus / Enterovirus NOT DETECTED NOT DETECTED Final   Influenza A H1 2009 DETECTED (A) NOT DETECTED Final   Influenza B NOT DETECTED NOT DETECTED Final   Parainfluenza Virus 1 NOT DETECTED NOT DETECTED Final   Parainfluenza Virus 2 NOT DETECTED NOT DETECTED Final   Parainfluenza Virus 3 NOT DETECTED NOT DETECTED Final   Parainfluenza Virus 4 NOT DETECTED NOT DETECTED Final   Respiratory Syncytial Virus NOT DETECTED NOT DETECTED Final   Bordetella pertussis NOT DETECTED NOT DETECTED Final   Bordetella Parapertussis NOT DETECTED NOT DETECTED Final   Chlamydophila  pneumoniae NOT DETECTED NOT DETECTED Final   Mycoplasma pneumoniae NOT DETECTED NOT DETECTED Final    Comment: Performed at [facility], [address] [place], [place] [address]    Labs: CBC: Recent Labs  Lab 09/01/23 0535  WBC 13.4*  NEUTROABS 11.3*  HGB 12.0*  HCT 36.0*  MCV 91.4  PLT 282   Basic Metabolic Panel: Recent Labs  Lab 08/26/23 0807 08/28/23 0937 08/29/23 0505 09/01/23 0535  NA 136  --  139 138  K 4.3 4.2 4.9 5.2*  CL 98  --  98 98  CO2 30  --  34* 33*  GLUCOSE 172*  --  119* 117*  BUN 49*  --  34* 29*  CREATININE 1.12  --  0.88 0.77  CALCIUM 8.8*  --  8.4* 8.3*  MG 2.3  --   --   --    Liver Function Tests: No results for input(s): "AST", "ALT", "ALKPHOS", "BILITOT", "PROT", "ALBUMIN" in the last 168 hours. CBG: No results for input(s): "GLUCAP" in the last 168 hours.  Discharge time spent:  35 minutes.  Signed: Loyce Dys, MD [facility] 09/01/2023

## 2023-09-01 NOTE — TOC Progression Note (Signed)
Transition of Care (TOC) - Progression Note    Patient Details  Name: Sage Kopera. MRN: [id number] Date of Birth: 04-27-1941  Transition of Care [facility]) CM/SW Contact  Bing Quarry, RN Phone Number: 09/01/2023, 10:20 AM  Clinical Narrative:  09/01/23: Informed [facility] [employer] P[employer] of insurance authorization approval number and that patient is able to be admitted to facility today. Updated provider and need for DC Summary when ready, to be sent via Hub per Admission Coordinator.   Gabriel Cirri MSN RN CM  RN Case Manager [place]  Transitions of Care Direct Dial: [phone] (Weekends Only) [facility] Main Office Phone: [phone] [facility] Fax: [phone] [email or web address]           Expected Discharge Plan and Services                                               Social Determinants of Health (SDOH) Interventions SDOH Screenings   Food Insecurity: No Food Insecurity (08/25/2023)  Housing: Low Risk  (08/25/2023)  Transportation Needs: No Transportation Needs (08/25/2023)  Utilities: Not At Risk (08/25/2023)  Depression (PHQ2-9): Low Risk  (07/15/2023)  Financial Resource Strain: Low Risk  (01/06/2023)  Physical Activity: Sufficiently Active (01/06/2023)  Social Connections: Unknown (08/25/2023)  Stress: No Stress Concern Present (01/06/2023)  Tobacco Use: Medium Risk (08/21/2023)    Readmission Risk Interventions     No data to display

## 2023-09-02 ENCOUNTER — Encounter: Payer: Self-pay | Admitting: Student

## 2023-09-02 ENCOUNTER — Non-Acute Institutional Stay (SKILLED_NURSING_FACILITY): Payer: Medicare Other | Admitting: Student

## 2023-09-02 DIAGNOSIS — F1020 Alcohol dependence, uncomplicated: Secondary | ICD-10-CM | POA: Diagnosis not present

## 2023-09-02 DIAGNOSIS — R2689 Other abnormalities of gait and mobility: Secondary | ICD-10-CM | POA: Diagnosis not present

## 2023-09-02 DIAGNOSIS — R278 Other lack of coordination: Secondary | ICD-10-CM | POA: Diagnosis not present

## 2023-09-02 DIAGNOSIS — I2510 Atherosclerotic heart disease of native coronary artery without angina pectoris: Secondary | ICD-10-CM | POA: Diagnosis not present

## 2023-09-02 DIAGNOSIS — J449 Chronic obstructive pulmonary disease, unspecified: Secondary | ICD-10-CM

## 2023-09-02 DIAGNOSIS — I4891 Unspecified atrial fibrillation: Secondary | ICD-10-CM

## 2023-09-02 DIAGNOSIS — J9621 Acute and chronic respiratory failure with hypoxia: Secondary | ICD-10-CM | POA: Diagnosis not present

## 2023-09-02 DIAGNOSIS — J9611 Chronic respiratory failure with hypoxia: Secondary | ICD-10-CM | POA: Diagnosis not present

## 2023-09-02 DIAGNOSIS — Z741 Need for assistance with personal care: Secondary | ICD-10-CM | POA: Diagnosis not present

## 2023-09-02 DIAGNOSIS — J431 Panlobular emphysema: Secondary | ICD-10-CM

## 2023-09-02 DIAGNOSIS — J441 Chronic obstructive pulmonary disease with (acute) exacerbation: Secondary | ICD-10-CM | POA: Diagnosis not present

## 2023-09-02 DIAGNOSIS — L98429 Non-pressure chronic ulcer of back with unspecified severity: Secondary | ICD-10-CM

## 2023-09-02 DIAGNOSIS — L97521 Non-pressure chronic ulcer of other part of left foot limited to breakdown of skin: Secondary | ICD-10-CM | POA: Diagnosis not present

## 2023-09-02 DIAGNOSIS — Z9981 Dependence on supplemental oxygen: Secondary | ICD-10-CM | POA: Diagnosis not present

## 2023-09-02 DIAGNOSIS — R2681 Unsteadiness on feet: Secondary | ICD-10-CM | POA: Diagnosis not present

## 2023-09-02 DIAGNOSIS — M6281 Muscle weakness (generalized): Secondary | ICD-10-CM | POA: Diagnosis not present

## 2023-09-02 DIAGNOSIS — I4811 Longstanding persistent atrial fibrillation: Secondary | ICD-10-CM | POA: Diagnosis not present

## 2023-09-02 DIAGNOSIS — N183 Chronic kidney disease, stage 3 unspecified: Secondary | ICD-10-CM | POA: Diagnosis not present

## 2023-09-02 DIAGNOSIS — C61 Malignant neoplasm of prostate: Secondary | ICD-10-CM

## 2023-09-02 MED ORDER — MORPHINE SULFATE (CONCENTRATE) 20 MG/ML PO SOLN: 5.0000 mg | Freq: Every day | ORAL | 0 refills | Status: DC | PRN

## 2023-09-02 MED ORDER — CLONAZEPAM 0.5 MG PO TABS: 0.5000 mg | ORAL_TABLET | Freq: Every day | ORAL | 0 refills | Status: DC

## 2023-09-02 NOTE — Progress Notes (Signed)
Provider:  Sydnee Cabal Location:  Other Nursing Home Room Number: [place] [facility] Place of Service:  SNF ([phone])  PCP: [name], MD Patient Care Team: [name], MD as PCP - General (Internal Medicine) Iran Ouch, MD as PCP - Cardiology (Cardiology) Lanier Prude, MD as PCP - Electrophysiology (Cardiology) Otho Ket, RN as [facility] Management  Extended Emergency Contact Information Primary Emergency Contact: Drew,Susan Address: [address]          [place], [place] [address] [place] of [place] Home Phone: [phone] Mobile Phone: [phone] Relation: Spouse  Code Status: DNR Goals of Care: Advanced Directive information    08/30/2023    9:11 AM  Advanced Directives  Type of Advance Directive Out of facility DNR (pink MOST or yellow form)  Pre-existing out of facility DNR order (yellow form or pink MOST form) Yellow form placed in chart (order not valid for inpatient use);Pink MOST form placed in chart (order not valid for inpatient use)      No chief complaint on file.   HPI: Patient is a 83 y.o. male seen today for admission to [facility].   History of Present Illness The patient, with COPD, presents with a COPD exacerbation.  He is experiencing a COPD exacerbation following a recent flu-like illness, which has significantly worsened his breathing. Although his breathing has improved at rest, he continues to experience significant difficulty with any activity. He was initially on a tapering dose of prednisone at home, down to 0.75 mg, but this was increased to 40 mg during his hospital stay. He has been on prednisone for an extended period and is concerned about skin thinning. He was supposed to start on 5 mg recently. He uses a nebulizer with a combination of ipratropium and albuterol and received IV steroids in the hospital. He has not had a recent steroid injection, which he used to get periodically for breathing  difficulties. He received morphine and Xanax in the hospital to help with breathing and anxiety, respectively, but these are not part of his current medication regimen. Morphine helped relax his breathing, but he does not want it regularly.  His current medications include Eliquis 5 mg twice daily, Proscar 5 mg daily, silver sulfadiazine for skin, Breztri two puffs twice daily, dofetilide twice daily, Protonix once daily, diltiazem, clonazepam for sleep and anxiety, gabapentin 200 mg at night, Atrovent twice daily, nitroglycerin as needed, DuoNebs every six hours as needed, Crestor, and Lasix with a specific dosing schedule. His oxygen level is at 92% on three liters, and he requires four liters with movement.  He reports painful bowel movements and constipation, requiring an enema in the hospital. He was taking a stool softener nightly but is unsure if he is receiving it currently. Miralax was ineffective, but Senna might help.  He has experienced significant weight loss, from 157 lbs to 142 lbs, but feels he has gained some back. He reports improved appetite over the last four days.  He has wounds on his toes and a recent wound on the back of his leg from a hospital bed injury. He has been using silver sulfadiazine on his toes, which are currently open.  Past Medical History:  Diagnosis Date   Abdominal discomfort 12/07/2020   Acquired trigger finger of right middle finger 02/04/2020   Acute prostatitis 04/08/2018   Alcohol abuse    Alcohol dependence (HCC) 12/07/2020   Anal or rectal pain 04/08/2018   Annual physical exam 12/07/2020  Anorectal disorder 12/07/2020   Arthritis    Atherosclerotic heart disease of native coronary artery without angina pectoris 04/08/2018   a. 03/2018 PCI: LM nl, LAD 61m, LCX nl, OM1 70p (2.75x8 Synergy DES), 46m (2.5x24 Synergy DES), RCA 25p, RPL1 50. EF 55-65%.   Atrial flutter (HCC)    a. 07/2022 in setting of hospitalization for resp illness/RSV. CHA2DS2VASc  = 4-->eliquis; b. 08/2022 s/p DCCV (100J); c. 09/26/2022 Recurrent Aflutter.   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 02/2022 Carotid U/S: 1-39% bilat ICA stenoses.   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   Contracture of palmar fascia 12/07/2020   COPD (chronic obstructive pulmonary disease) (HCC)    Corn of toe 12/07/2020   Corns and callosities 04/09/2018   Coronary artery disease    2019 with stents   Coronary atherosclerosis 05/07/2018   Degenerative disc disease, 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   Degenerative disc disease, 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   Diarrhea 04/09/2018   Double vision with both eyes open 12/07/2020   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   Dysphagia 08/14/2018   Dyspnea    ED (erectile dysfunction)    ED (erectile dysfunction) of organic origin 12/07/2020   Elevated prostate specific antigen (PSA) 04/08/2018   Elevated PSA    Encounter for screening for other disorder 12/07/2020   Enlarged prostate without lower urinary tract symptoms (luts) 04/09/2018   Enthesopathy 12/07/2020   Essential (primary) hypertension 04/08/2018   Ex-smoker 04/09/2018   Exertional dyspnea 04/02/2018   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 07/2022 Echo: EF 65-70%, no rwma, mild LVH, nl RV fxn.   Hyperlipidemia     Hypertension    Hypoxemia 12/07/2020   Kidney cysts    Left knee pain 12/07/2020   Low back pain 04/08/2018   Male erectile dysfunction 04/09/2018   Mixed hyperlipidemia 04/08/2018   Nocturia more than twice per night 04/09/2018   Osteoarthritis of first carpometacarpal joint 04/08/2018   Osteoarthritis of knee 12/07/2020   Other long term (current) drug therapy 12/07/2020   Pain due to onychomycosis of toenail of left foot 07/20/2019   Pain in joint 04/09/2018   Pain in knee 04/09/2018   Pain in unspecified knee 12/07/2020   Pain of finger 04/09/2018   Palpitations    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HCC) 12/07/2020   Personal history of colonic polyps 12/07/2020   Pneumonia    Pneumothorax 04/12/2020   Polypharmacy 04/09/2018   Porokeratosis 01/13/2019   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HCC)    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   Pulmonary nodules 06/08/2016   Pure hypercholesterolemia 12/07/2020   Renal cyst 12/07/2020   Sleep disorder 04/08/2018   Status post bronchoscopy 04/12/2020   Tear of rotator cuff 04/09/2018   Tobacco user 12/07/2020   Uncomplicated alcohol dependence (HCC) 04/09/2018   Unspecified rotator cuff tear or rupture of unspecified shoulder, not specified as traumatic 12/07/2020  Unspecified tear of unspecified meniscus, current injury, unspecified knee, initial encounter 12/07/2020   Visual disturbance 12/07/2020   Vitamin D deficiency 04/08/2018   Past Surgical History:  Procedure Laterality Date   BRONCHIAL BIOPSY  10/13/2019   Procedure: BRONCHIAL BIOPSIES;  Surgeon: Leslye Peer, MD;  Location: [facility] ENDOSCOPY;  Service: Pulmonary;;   BRONCHIAL BIOPSY  04/12/2020   Procedure: BRONCHIAL BIOPSIES;  Surgeon: Leslye Peer, MD;  Location: [facility] ENDOSCOPY;  Service: Pulmonary;;   BRONCHIAL BRUSHINGS  10/13/2019   Procedure: BRONCHIAL BRUSHINGS;  Surgeon: Leslye Peer, MD;  Location: [facility] ENDOSCOPY;  Service: Pulmonary;;   BRONCHIAL BRUSHINGS  04/12/2020   Procedure:  BRONCHIAL BRUSHINGS;  Surgeon: Leslye Peer, MD;  Location[facility] ENDOSCOPY;  Service: Pulmonary;;   BRONCHIAL NEEDLE ASPIRATION BIOPSY  10/13/2019   Procedure: BRONCHIAL NEEDLE ASPIRATION BIOPSIES;  Surgeon: Leslye Peer, MD;  Location: [facility] ENDOSCOPY;  Service: Pulmonary;;   BRONCHIAL NEEDLE ASPIRATION BIOPSY  04/12/2020   Procedure: BRONCHIAL NEEDLE ASPIRATION BIOPSIES;  Surgeon: Leslye Peer, MD;  Location: [facility] ENDOSCOPY;  Service: Pulmonary;;   BRONCHIAL WASHINGS  10/13/2019   Procedure: BRONCHIAL WASHINGS;  Surgeon: Leslye Peer, MD;  Location: [facility] ENDOSCOPY;  Service: Pulmonary;;   BRONCHIAL WASHINGS  04/12/2020   Procedure: BRONCHIAL WASHINGS;  Surgeon: Leslye Peer, MD;  Location: [facility] ENDOSCOPY;  Service: Pulmonary;;   CARDIAC CATHETERIZATION  2002   50% RCA   CARDIOVERSION N/A 09/17/2022   Procedure: CARDIOVERSION;  Surgeon: Iran Ouch, MD;  Location: [facility] ORS;  Service: Cardiovascular;  Laterality: N/A;   CATARACT EXTRACTION, BILATERAL     CORONARY STENT INTERVENTION N/A 04/15/2018   Procedure: CORONARY STENT INTERVENTION;  Surgeon: Corky Crafts, MD;  Location: [facility] INVASIVE CV LAB;  Service: Cardiovascular;  Laterality: N/A;  om1   EYE SURGERY     KNEE ARTHROSCOPY     LEFT HEART CATH AND CORONARY ANGIOGRAPHY N/A 04/15/2018   Procedure: LEFT HEART CATH AND CORONARY ANGIOGRAPHY;  Surgeon: Corky Crafts, MD;  Location: [facility] INVASIVE CV LAB;  Service: Cardiovascular;  Laterality: N/A;   MULTIPLE TOOTH EXTRACTIONS     TONSILLECTOMY     VIDEO BRONCHOSCOPY  04/12/2020   VIDEO BRONCHOSCOPY WITH ENDOBRONCHIAL NAVIGATION N/A 07/26/2016   Procedure: VIDEO BRONCHOSCOPY WITH ENDOBRONCHIAL NAVIGATION;  Surgeon: Leslye Peer, MD;  Location: [facility] OR;  Service: Thoracic;  Laterality: N/A;   VIDEO BRONCHOSCOPY WITH ENDOBRONCHIAL NAVIGATION N/A 10/13/2019   Procedure: [facility] AND VIDEO BRONCHOSCOPY WITH ENDOBRONCHIAL NAVIGATION;  Surgeon: Leslye Peer, MD;  Location: [facility]  ENDOSCOPY;  Service: Pulmonary;  Laterality: N/A;   VIDEO BRONCHOSCOPY WITH ENDOBRONCHIAL NAVIGATION N/A 04/12/2020   Procedure: VIDEO BRONCHOSCOPY WITH ENDOBRONCHIAL NAVIGATION;  Surgeon: Leslye Peer, MD;  Location: [facility] ENDOSCOPY;  Service: Pulmonary;  Laterality: N/A;    reports that he quit smoking about 3 years ago. His smoking use included cigarettes. He started smoking about 71 years ago. He has a 51 pack-year smoking history. He has never been exposed to tobacco smoke. He has never used smokeless tobacco. He reports that he does not drink alcohol and does not use drugs. 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  Tobacco Use  Smoking status: Former    Current packs/day: 0.00    Average packs/day: 0.8 packs/day for 68.0 years (51.0 ttl pk-yrs)    Types: Cigarettes    Start date: 31    Quit date: 07/13/2020    Years since quitting: 3.1    Passive exposure: Never   Smokeless tobacco: Never   Tobacco comments:    Recent Quit    Vaping Use   Vaping status: Former  Substance and Sexual Activity   Alcohol use: No    Alcohol/week: 0.0 standard drinks of alcohol    Comment: quit drinking 2014   Drug use: No   Sexual activity: Not on file  Other Topics Concern   Not on file  Social History Narrative   Has living will   Wife is health care POA--alternate is son Jeanice Lim)   Has daughter in [place]   Has DNR   No tube feeds if cognitively unaware   Social Drivers of Health   Financial Resource Strain: Low Risk  (01/06/2023)   Overall Financial Resource Strain (CARDIA)    Difficulty of Paying Living Expenses: Not hard at all  Food Insecurity: No Food Insecurity (08/25/2023)   Hunger Vital  Sign    Worried About Running Out of Food in the Last Year: Never true    Ran Out of Food in the Last Year: Never true  Transportation Needs: No Transportation Needs (08/25/2023)   PRAPARE - Administrator, Civil Service (Medical): No    Lack of Transportation (Non-Medical): No  Physical Activity: Sufficiently Active (01/06/2023)   Exercise Vital Sign    Days of Exercise per Week: 4 days    Minutes of Exercise per Session: 40 min  Stress: No Stress Concern Present (01/06/2023)   [employer] of Occupational Health - Occupational Stress Questionnaire    Feeling of Stress : Not at all  Social Connections: Unknown (08/25/2023)   Social Connection and Isolation Panel [NHANES]    Frequency of Communication with Friends and Family: Never    Frequency of Social Gatherings with Friends and Family: Once a week    Attends Religious Services: More than 4 times per year    Active Member of [employer] or Organizations: Not on file    Attends Banker Meetings: Not on file    Marital Status: Married  Intimate Partner Violence: Not At Risk (08/25/2023)   Humiliation, Afraid, Rape, and Kick questionnaire    Fear of Current or Ex-Partner: No    Emotionally Abused: No    Physically Abused: No    Sexually Abused: No    Functional Status Survey:    Family History  Problem Relation Age of Onset   Hypertension Mother    Alzheimer's disease Mother     Health Maintenance  Topic Date Due   DTaP/Tdap/Td (1 - Tdap) Never done   Zoster Vaccines- Shingrix (1 of 2) 03/22/1960   COVID-19 Vaccine (7 - 2024-25 season) 09/29/2023   Pneumonia Vaccine 78+ Years old  Completed   INFLUENZA VACCINE  Completed   HPV VACCINES  Aged Out    Allergies  Allergen Reactions   Flagyl [Metronidazole] Other (See Comments)    Other reaction(s): skin reaction    Outpatient Encounter Medications as of 09/02/2023  Medication Sig   morphine (ROXANOL) 20 MG/ML concentrated solution Take 0.25 mLs (5 mg  total) by mouth daily as needed.   acetaminophen (TYLENOL) 325 MG tablet Take 650 mg by mouth every 4 (four) hours as  needed.   albuterol (VENTOLIN HFA) 108 (90 Base) MCG/ACT inhaler INHALE 2 PUFFS 2-3 TIMES A DAY AS NEEDED FOR WHEEZING (30 DAYS) 30 DAY(S)   apixaban (ELIQUIS) 5 MG TABS tablet Take 1 tablet (5 mg total) by mouth 2 (two) times daily.   Budeson-Glycopyrrol-Formoterol (BREZTRI AEROSPHERE) 160-9-4.8 MCG/ACT AERO Inhale 2 puffs into the lungs 2 (two) times daily.   clonazePAM (KLONOPIN) 0.5 MG tablet Take 1 tablet (0.5 mg total) by mouth at bedtime.   diltiazem (CARDIZEM CD) 120 MG 24 hr capsule Take 1 capsule (120 mg total) by mouth 2 (two) times daily.   Docusate Calcium (STOOL SOFTENER PO) Take 1 tablet by mouth every evening.   dofetilide (TIKOSYN) 250 MCG capsule Take 1 capsule (250 mcg total) by mouth 2 (two) times daily.   finasteride (PROSCAR) 5 MG tablet Take 5 mg by mouth daily.   furosemide (LASIX) 20 MG tablet Take 2 tablets (40 mg total) by mouth as directed. Take 2 tablets in the morning (40 mg) for 4 days.   gabapentin (NEURONTIN) 100 MG capsule Take 200 mg by mouth at bedtime.   ipratropium (ATROVENT) 0.03 % nasal spray Place 2 sprays into both nostrils every 12 (twelve) hours.   ipratropium-albuterol (DUONEB) 0.5-2.5 (3) MG/3ML SOLN INHALE 3 ML BY NEBULIZER EVERY 6 HOURS AS NEEDED   nitroGLYCERIN (NITROSTAT) 0.4 MG SL tablet Place 1 tablet (0.4 mg total) under the tongue every 5 (five) minutes as needed for chest pain.   OXYGEN Inhale into the lungs. 3-4 liters upon exertion and 3 liters continuous at night.   pantoprazole (PROTONIX) 40 MG tablet Take 1 tablet (40 mg total) by mouth daily.   polyethylene glycol (MIRALAX / GLYCOLAX) 17 g packet Take 17 g by mouth daily as needed for moderate constipation.   predniSONE (DELTASONE) 10 MG tablet Take 4 tablets (40 mg total) by mouth daily for 3 days, THEN 2 tablets (20 mg total) daily for 3 days, THEN 1 tablet (10 mg total)  daily for 3 days.   rosuvastatin (CRESTOR) 20 MG tablet Take 1 tablet (20 mg total) by mouth daily.   silver sulfADIAZINE (SILVADENE) 1 % cream Apply 1 Application topically daily.   sodium chloride HYPERTONIC 3 % nebulizer solution Take by nebulization in the morning and at bedtime.   [DISCONTINUED] clonazePAM (KLONOPIN) 0.5 MG tablet TAKE 1 TABLET BY MOUTH AT BEDTIME.   No facility-administered encounter medications on file as of 09/02/2023.    Review of Systems  Vitals:   09/02/23 1457  BP: 122/66  Pulse: 75  Resp: 18  Temp: 97.8 F (36.6 C)  SpO2: 92%  Weight: 142 lb (64.4 kg)  PF: (!) 3 L/min   Body mass index is 20.37 kg/m. Physical Exam Constitutional:      Comments: thin  Cardiovascular:     Rate and Rhythm: Normal rate.     Pulses: Normal pulses.     Heart sounds: Normal heart sounds.  Pulmonary:     Breath sounds: Rhonchi present. No wheezing.  Abdominal:     General: Abdomen is flat.     Palpations: Abdomen is soft.  Skin:    Comments: Generalized skin attrophy. 2nd toe bilaterally with ulceration dry.   Neurological:     General: No focal deficit present.     Mental Status: He is alert and oriented to person, place, and time. Mental status is at baseline.     Physical Exam VITALS: SaO2- 92% MEASUREMENTS: WT- 142 pounds SKIN: Open wounds  on toes. Recent wound on back of leg from hospital bed. No tenderness over examined areas. Preventative bandages on heels, no wound observed.  Labs reviewed: Basic Metabolic Panel: Recent Labs    10/01/22 1234 03/04/23 1126 07/15/23 1114 08/23/23 1325 08/24/23 0430 08/25/23 0527 08/26/23 0807 08/28/23 0937 08/29/23 0505 09/01/23 0535  NA 141   < > 144   < > 139 137 136  --  139 138  K 4.2   < > 4.8   < > 3.6 3.9 4.3 4.2 4.9 5.2*  CL 97   < > 101   < > 94* 96* 98  --  98 98  CO2 35*   < > 34*   < > 31 31 30   --  34* 33*  GLUCOSE 90   < > 90   < > 162* 179* 172*  --  119* 117*  BUN 21   < > 26*   < > 26* 40*  49*  --  34* 29*  CREATININE 1.04   < > 1.01   < > 1.00 1.10 1.12  --  0.88 0.77  CALCIUM 9.2   < > 9.2   < > 8.4* 8.6* 8.8*  --  8.4* 8.3*  MG  --    < >  --   --  2.5* 2.4 2.3  --   --   --   PHOS 3.3  --  3.6  --   --   --   --   --   --   --    < > = values in this interval not displayed.   Liver Function Tests: Recent Labs    09/22/22 1125 10/01/22 1234 07/15/23 1114 08/23/23 1325  AST 20  --   --  25  ALT 15  --   --  18  ALKPHOS 49  --   --  61  BILITOT 0.5  --   --  0.4  PROT 6.0*  --   --  6.9  ALBUMIN 3.2* 3.5 3.8 3.6   No results for input(s): "LIPASE", "AMYLASE" in the last 8760 hours. No results for input(s): "AMMONIA" in the last 8760 hours. CBC: Recent Labs    05/31/23 1530 06/01/23 0730 08/23/23 1325 08/24/23 0430 09/01/23 0535  WBC 13.3*   < > 13.5* 5.6 13.4*  NEUTROABS 11.9*  --  12.2*  --  11.3*  HGB 12.3*   < > 13.6 11.8* 12.0*  HCT 36.6*   < > 43.0 36.0* 36.0*  MCV 92.4   < > 96.6 93.0 91.4  PLT 136*   < > 304 249 282   < > = values in this interval not displayed.   Cardiac Enzymes: No results for input(s): "CKTOTAL", "CKMB", "CKMBINDEX", "TROPONINI" in the last 8760 hours. BNP: Invalid input(s): "POCBNP" Lab Results  Component Value Date   HGBA1C 4.8 07/15/2023   Lab Results  Component Value Date   TSH 0.700 08/12/2022   Lab Results  Component Value Date   VITAMINB12 655 06/01/2023   Lab Results  Component Value Date   FOLATE 32.0 05/31/2023   Lab Results  Component Value Date   IRON 31 (L) 05/31/2023   TIBC 231 (L) 05/31/2023   FERRITIN 222 05/31/2023    Imaging and Procedures obtained prior to SNF admission: ECHOCARDIOGRAM COMPLETE Result Date: 08/26/2023    ECHOCARDIOGRAM REPORT   Patient Name:   [name] [name]. Date of Exam: 08/25/2023 Medical Rec #:  [id number]  Height:       70.5 in Accession #:    [id number]           Weight:       155.0 lb Date of Birth:  Jun 25, 1941            BSA:          1.883 m Patient  Age:    82 years             BP:           139/77 mmHg Patient Gender: M                    HR:           90 bpm. Exam Location:  [facility] Procedure: 2D Echo, Cardiac Doppler and Color Doppler Indications:     Acytre respiratory distress R06.03  History:         Patient has prior history of Echocardiogram examinations, most                  recent 08/07/2022. CAD, COPD, Arrythmias:Atrial Fibrillation and                  Atrial Flutter; Risk Factors:Hypertension and Dyslipidemia.  Sonographer:     Lucendia Herrlich RCS Referring Phys:  [id number] [name] Diagnosing Phys: Chilton Si MD  Sonographer Comments: Image acquisition challenging due to respiratory motion. IMPRESSIONS  1. Left ventricular ejection fraction, by estimation, is 60 to 65%. The left ventricle has normal function. The left ventricle has no regional wall motion abnormalities. Left ventricular diastolic parameters are indeterminate.  2. Right ventricular systolic function is normal. The right ventricular size is normal. There is mildly elevated pulmonary artery systolic pressure.  3. The mitral valve is normal in structure. No evidence of mitral valve regurgitation. No evidence of mitral stenosis.  4. The aortic valve is tricuspid. Aortic valve regurgitation is not visualized. No aortic stenosis is present.  5. The inferior vena cava is normal in size with greater than 50% respiratory variability, suggesting right atrial pressure of 3 mmHg. FINDINGS  Left Ventricle: Left ventricular ejection fraction, by estimation, is 60 to 65%. The left ventricle has normal function. The left ventricle has no regional wall motion abnormalities. The left ventricular internal cavity size was normal in size. There is  no left ventricular hypertrophy. Left ventricular diastolic parameters are indeterminate. Indeterminate filling pressures. Right Ventricle: The right ventricular size is normal. No increase in right ventricular wall thickness. Right ventricular  systolic function is normal. There is mildly elevated pulmonary artery systolic pressure. The tricuspid regurgitant velocity is 2.97  m/s, and with an assumed right atrial pressure of 3 mmHg, the estimated right ventricular systolic pressure is 38.3 mmHg. Left Atrium: Left atrial size was normal in size. Right Atrium: Right atrial size was normal in size. Pericardium: There is no evidence of pericardial effusion. Mitral Valve: The mitral valve is normal in structure. No evidence of mitral valve regurgitation. No evidence of mitral valve stenosis. Tricuspid Valve: The tricuspid valve is normal in structure. Tricuspid valve regurgitation is trivial. No evidence of tricuspid stenosis. Aortic Valve: The aortic valve is tricuspid. Aortic valve regurgitation is not visualized. No aortic stenosis is present. Aortic valve peak gradient measures 5.1 mmHg. Pulmonic Valve: The pulmonic valve was normal in structure. Pulmonic valve regurgitation is not visualized. No evidence of pulmonic stenosis. Aorta: The aortic root is normal in size and structure. Venous: The inferior vena  cava is normal in size with greater than 50% respiratory variability, suggesting right atrial pressure of 3 mmHg. IAS/Shunts: No atrial level shunt detected by color flow Doppler.  LEFT VENTRICLE PLAX 2D LVIDd:         4.20 cm   Diastology LVIDs:         2.60 cm   LV e' medial:    9.14 cm/s LV PW:         1.10 cm   LV E/e' medial:  10.9 LV IVS:        0.80 cm   LV e' lateral:   8.27 cm/s LVOT diam:     2.00 cm   LV E/e' lateral: 12.1 LV SV:         53 LV SV Index:   28 LVOT Area:     3.14 cm  RIGHT VENTRICLE            IVC RV S prime:     9.79 cm/s  IVC diam: 1.40 cm TAPSE (M-mode): 2.5 cm LEFT ATRIUM         Index LA diam:    3.10 cm 1.65 cm/m  AORTIC VALVE AV Area (Vmax): 2.10 cm AV Vmax:        113.00 cm/s AV Peak Grad:   5.1 mmHg LVOT Vmax:      75.63 cm/s LVOT Vmean:     49.867 cm/s LVOT VTI:       0.168 m  AORTA Ao Root diam: 3.20 cm Ao Asc  diam:  3.10 cm MITRAL VALVE                TRICUSPID VALVE MV Area (PHT): 3.77 cm     TR Peak grad:   35.3 mmHg MV Decel Time: 201 msec     TR Vmax:        297.00 cm/s MV E velocity: 100.00 cm/s MV A velocity: 99.00 cm/s   SHUNTS MV E/A ratio:  1.01         Systemic VTI:  0.17 m                             Systemic Diam: 2.00 cm Chilton Si MD Electronically signed by Chilton Si MD Signature Date/Time: 08/26/2023/7:51:48 AM    Final     Assessment/Plan COPD Exacerbation Admitted after a COPD exacerbation, likely triggered by influenza. Currently on prednisone 40 mg to manage inflammation. Breathing at rest has improved, but activity-induced dyspnea persists. Hospitalization included IV steroids and a higher prednisone dose. Discussed the importance of tapering to manage inflammation and avoid long-term side effects like skin thinning. Patient prefers to follow the recommended tapering schedule despite concerns about long-term use. - Administer prednisone 40 mg for 3 days, then 20 mg for 3 days, 10 mg for 2 days, and then 5 mg for 2 months - Continue DuoNebs (ipratropium and albuterol) every 6 hours as needed. - Monitor breathing and adjust oxygen therapy as needed. - Morphine 0.25 mL every day prn for shortness of breath x2 weeks.   Atrial Fibrillation On dofetilide and diltiazem for atrial fibrillation. No recent changes in medication regimen. - Continue dofetilide 500 mcg twice daily. - Continue diltiazem 120 mg twice daily.  Constipation Reports painful bowel movements and difficulty passing stool. Miralax was ineffective. - Administer Senna at night.  Skin Ulcers Sacral and Open areas on toes and a recent wound on the back of the leg from  hospital bed injury. Using silver sulfadiazine for skin care. - Apply silver sulfadiazine to open areas on toes. - Monitor and change bandages as needed.  General Health Maintenance Blood pressure, heart rate, and oxygen levels are  well-managed. Weight loss noted but has been gaining some back. No chest pain reported. - Monitor vital signs regularly. - Encourage balanced diet to regain weight.  Goals of Care Wishes to avoid long-term mechanical ventilation but is open to hospital treatment for acute exacerbations. DNR status confirmed. Prefers to maintain quality of life, including activities like playing cards and organizing community events. Palliative care follow-up to continue monthly. - Ensure DNR status is documented and followed. - Continue palliative care follow-up monthly.  Follow-up - Follow up with physical therapy to meet mobility goals. - Ensure all medications are available and administered as prescribed.   Family/ staff Communication: Nursing, spouse  Labs/tests ordered: CBC, BMP  I spent greater than 45 minutes for the care of this patient in face to face time, chart review, clinical documentation, patient education.

## 2023-09-03 ENCOUNTER — Other Ambulatory Visit: Payer: Self-pay | Admitting: *Deleted

## 2023-09-03 DIAGNOSIS — Z9981 Dependence on supplemental oxygen: Secondary | ICD-10-CM | POA: Diagnosis not present

## 2023-09-03 DIAGNOSIS — I2510 Atherosclerotic heart disease of native coronary artery without angina pectoris: Secondary | ICD-10-CM | POA: Diagnosis not present

## 2023-09-03 DIAGNOSIS — Z741 Need for assistance with personal care: Secondary | ICD-10-CM | POA: Diagnosis not present

## 2023-09-03 DIAGNOSIS — J441 Chronic obstructive pulmonary disease with (acute) exacerbation: Secondary | ICD-10-CM | POA: Diagnosis not present

## 2023-09-03 DIAGNOSIS — I4891 Unspecified atrial fibrillation: Secondary | ICD-10-CM | POA: Diagnosis not present

## 2023-09-03 DIAGNOSIS — M6281 Muscle weakness (generalized): Secondary | ICD-10-CM | POA: Diagnosis not present

## 2023-09-03 DIAGNOSIS — R278 Other lack of coordination: Secondary | ICD-10-CM | POA: Diagnosis not present

## 2023-09-03 DIAGNOSIS — J9621 Acute and chronic respiratory failure with hypoxia: Secondary | ICD-10-CM | POA: Diagnosis not present

## 2023-09-03 DIAGNOSIS — I4811 Longstanding persistent atrial fibrillation: Secondary | ICD-10-CM | POA: Diagnosis not present

## 2023-09-03 DIAGNOSIS — R2689 Other abnormalities of gait and mobility: Secondary | ICD-10-CM | POA: Diagnosis not present

## 2023-09-03 DIAGNOSIS — R2681 Unsteadiness on feet: Secondary | ICD-10-CM | POA: Diagnosis not present

## 2023-09-03 DIAGNOSIS — J431 Panlobular emphysema: Secondary | ICD-10-CM | POA: Diagnosis not present

## 2023-09-03 NOTE — Patient Outreach (Addendum)
 Mr. Antonini admitted to [facility] [place] recently. Active with [facility]) RN CM prior, as benefit of health plan and PCP.   Secure communication sent to Alfonso, Admissions Director, to make aware writer will follow for ongoing chronic care management needs and transition plans.   Will keep [facility] RN CM updated as information received.   Addendum: Alfonso confirms Mr. Bruni will return to [facility] at completion of rehab services.  Will continue to follow.   Pablo Hurst, MSN, RN, BSN [place]  [facility], [facility] RN Post- Acute Care Manager Direct Dial: [phone]

## 2023-09-03 NOTE — Consult Note (Signed)
[facility] Liaison Consult Note    09/03/2023  [name] [name]. Jul 04, 1941 [id number]  Update: Pt discharged to SNF level of care for ongoing STR  with plans to return to his IL at [facility].   Liaison will collaborate with the involved RNCM and PAC-RN for ongoing follow up post hospital discharged.  Of note, Care Management services does not replace or interfere with any services that are needed or arranged by inpatient [facility] care management team.   For additional questions or referrals please contact:   Elliot Cousin, RN, [facility] Liaison [place]   [facility], [facility] Office Hours MTWF  8:00 am-6:00 pm Direct Dial: [phone] mobile [phone] [Office toll free line] Office Hours are M-F 8:30 - 5 pm [email or web address]

## 2023-09-04 DIAGNOSIS — J9621 Acute and chronic respiratory failure with hypoxia: Secondary | ICD-10-CM | POA: Diagnosis not present

## 2023-09-04 DIAGNOSIS — Z741 Need for assistance with personal care: Secondary | ICD-10-CM | POA: Diagnosis not present

## 2023-09-04 DIAGNOSIS — J441 Chronic obstructive pulmonary disease with (acute) exacerbation: Secondary | ICD-10-CM | POA: Diagnosis not present

## 2023-09-04 DIAGNOSIS — R278 Other lack of coordination: Secondary | ICD-10-CM | POA: Diagnosis not present

## 2023-09-04 DIAGNOSIS — R2689 Other abnormalities of gait and mobility: Secondary | ICD-10-CM | POA: Diagnosis not present

## 2023-09-04 DIAGNOSIS — I4891 Unspecified atrial fibrillation: Secondary | ICD-10-CM | POA: Diagnosis not present

## 2023-09-04 DIAGNOSIS — R2681 Unsteadiness on feet: Secondary | ICD-10-CM | POA: Diagnosis not present

## 2023-09-04 DIAGNOSIS — Z9981 Dependence on supplemental oxygen: Secondary | ICD-10-CM | POA: Diagnosis not present

## 2023-09-04 DIAGNOSIS — I4811 Longstanding persistent atrial fibrillation: Secondary | ICD-10-CM | POA: Diagnosis not present

## 2023-09-04 DIAGNOSIS — M6281 Muscle weakness (generalized): Secondary | ICD-10-CM | POA: Diagnosis not present

## 2023-09-04 DIAGNOSIS — J431 Panlobular emphysema: Secondary | ICD-10-CM | POA: Diagnosis not present

## 2023-09-04 DIAGNOSIS — I2510 Atherosclerotic heart disease of native coronary artery without angina pectoris: Secondary | ICD-10-CM | POA: Diagnosis not present

## 2023-09-05 DIAGNOSIS — J431 Panlobular emphysema: Secondary | ICD-10-CM | POA: Diagnosis not present

## 2023-09-05 DIAGNOSIS — J441 Chronic obstructive pulmonary disease with (acute) exacerbation: Secondary | ICD-10-CM | POA: Diagnosis not present

## 2023-09-05 DIAGNOSIS — I2510 Atherosclerotic heart disease of native coronary artery without angina pectoris: Secondary | ICD-10-CM | POA: Diagnosis not present

## 2023-09-05 DIAGNOSIS — I4891 Unspecified atrial fibrillation: Secondary | ICD-10-CM | POA: Diagnosis not present

## 2023-09-05 DIAGNOSIS — R278 Other lack of coordination: Secondary | ICD-10-CM | POA: Diagnosis not present

## 2023-09-05 DIAGNOSIS — M6281 Muscle weakness (generalized): Secondary | ICD-10-CM | POA: Diagnosis not present

## 2023-09-05 DIAGNOSIS — R2689 Other abnormalities of gait and mobility: Secondary | ICD-10-CM | POA: Diagnosis not present

## 2023-09-05 DIAGNOSIS — Z9981 Dependence on supplemental oxygen: Secondary | ICD-10-CM | POA: Diagnosis not present

## 2023-09-05 DIAGNOSIS — Z741 Need for assistance with personal care: Secondary | ICD-10-CM | POA: Diagnosis not present

## 2023-09-05 DIAGNOSIS — R2681 Unsteadiness on feet: Secondary | ICD-10-CM | POA: Diagnosis not present

## 2023-09-05 DIAGNOSIS — J9621 Acute and chronic respiratory failure with hypoxia: Secondary | ICD-10-CM | POA: Diagnosis not present

## 2023-09-05 DIAGNOSIS — I4811 Longstanding persistent atrial fibrillation: Secondary | ICD-10-CM | POA: Diagnosis not present

## 2023-09-06 ENCOUNTER — Encounter: Payer: Self-pay | Admitting: Student

## 2023-09-06 ENCOUNTER — Non-Acute Institutional Stay (SKILLED_NURSING_FACILITY): Payer: Medicare Other | Admitting: Student

## 2023-09-06 DIAGNOSIS — J9611 Chronic respiratory failure with hypoxia: Secondary | ICD-10-CM | POA: Diagnosis not present

## 2023-09-06 DIAGNOSIS — R2681 Unsteadiness on feet: Secondary | ICD-10-CM | POA: Diagnosis not present

## 2023-09-06 DIAGNOSIS — E441 Mild protein-calorie malnutrition: Secondary | ICD-10-CM

## 2023-09-06 DIAGNOSIS — J9612 Chronic respiratory failure with hypercapnia: Secondary | ICD-10-CM

## 2023-09-06 DIAGNOSIS — M6281 Muscle weakness (generalized): Secondary | ICD-10-CM | POA: Diagnosis not present

## 2023-09-06 DIAGNOSIS — J441 Chronic obstructive pulmonary disease with (acute) exacerbation: Secondary | ICD-10-CM | POA: Diagnosis not present

## 2023-09-06 DIAGNOSIS — I959 Hypotension, unspecified: Secondary | ICD-10-CM | POA: Diagnosis not present

## 2023-09-06 DIAGNOSIS — J449 Chronic obstructive pulmonary disease, unspecified: Secondary | ICD-10-CM | POA: Diagnosis not present

## 2023-09-06 DIAGNOSIS — R2689 Other abnormalities of gait and mobility: Secondary | ICD-10-CM | POA: Diagnosis not present

## 2023-09-06 DIAGNOSIS — J9621 Acute and chronic respiratory failure with hypoxia: Secondary | ICD-10-CM | POA: Diagnosis not present

## 2023-09-06 DIAGNOSIS — I4892 Unspecified atrial flutter: Secondary | ICD-10-CM | POA: Diagnosis not present

## 2023-09-06 DIAGNOSIS — I4891 Unspecified atrial fibrillation: Secondary | ICD-10-CM | POA: Diagnosis not present

## 2023-09-06 DIAGNOSIS — I4811 Longstanding persistent atrial fibrillation: Secondary | ICD-10-CM | POA: Diagnosis not present

## 2023-09-06 DIAGNOSIS — I2510 Atherosclerotic heart disease of native coronary artery without angina pectoris: Secondary | ICD-10-CM | POA: Diagnosis not present

## 2023-09-06 DIAGNOSIS — R278 Other lack of coordination: Secondary | ICD-10-CM | POA: Diagnosis not present

## 2023-09-06 DIAGNOSIS — J431 Panlobular emphysema: Secondary | ICD-10-CM | POA: Diagnosis not present

## 2023-09-06 DIAGNOSIS — Z741 Need for assistance with personal care: Secondary | ICD-10-CM | POA: Diagnosis not present

## 2023-09-06 DIAGNOSIS — Z9981 Dependence on supplemental oxygen: Secondary | ICD-10-CM | POA: Diagnosis not present

## 2023-09-06 NOTE — Progress Notes (Signed)
 Location:  Other Nursing Home Room Number: 109 A Place of Service:  SNF (31) Provider:  Abdul Jimmy Charlie LILLETTE, MD  Patient Care Team: Jimmy Charlie LILLETTE, MD as PCP - General (Internal Medicine) Darron Deatrice LABOR, MD as PCP - Cardiology (Cardiology) Cindie Ole DASEN, MD as PCP - Electrophysiology (Cardiology) Landy Arvin BRAVO, RN as [facility]  Extended Emergency Contact Information Primary Emergency Contact: Middleton,Susan Address: [address]          [place], [place] [address] [place] Home Phone: [phone] Mobile Phone: [phone] Relation: Spouse  Code Status:  DNR Goals of care: Advanced Directive information    08/30/2023    9:11 AM  Advanced Directives  Type of Advance Directive Out of facility DNR (pink MOST or yellow form)  Pre-existing out of facility DNR order (yellow form or pink MOST form) Yellow form placed in chart (order not valid for inpatient use);Pink MOST form placed in chart (order not valid for inpatient use)     Chief Complaint  Patient presents with   Edema    Hand swelling     HPI:  Pt is a 83 y.o. male seen today for an acute visit for  skin irritation and swelling.  The patient experiences discomfort and irritation in the buttock area, describing it as 'skin scritching' and a wound that was treated with a pad. The pain has returned and feels like the skin is stretching, even when sitting on a soft surface. A cream prescribed by the wound care team was effective in healing the area previously.  Swelling in the hand has been present for three days but appears better today. The hand was very red, particularly the fingers, but he can now remove his ring as the swelling has decreased. Swelling in the legs worsens when sitting due to blood pooling but improves when in bed.  Breathing has improved, allowing him to walk to the desk without stopping and take fewer rest breaks compared to previous  attempts. He attributes this improvement to using oxygen  and a rollator.  He notes a low blood pressure reading of 99/40 the previous day, which has since increased to 130/70. He is on a tapering dose of prednisone , currently at a low dose, and recalls being on diuretics in the hospital, which may affect his blood pressure.    Past Medical History:  Diagnosis Date   Abdominal discomfort 12/07/2020   Acquired trigger finger of right middle finger 02/04/2020   Acute prostatitis 04/08/2018   Alcohol abuse    Alcohol dependence (HCC) 12/07/2020   Anal or rectal pain 04/08/2018   Annual physical exam 12/07/2020   Anorectal disorder 12/07/2020   Arthritis    Atherosclerotic heart disease of native coronary artery without angina pectoris 04/08/2018   a. 03/2018 PCI: LM nl, LAD 39m, LCX nl, OM1 70p (2.75x8 Synergy DES), 46m (2.5x24 Synergy DES), RCA 25p, RPL1 50. EF 55-65%.   Atrial flutter (HCC)    a. 07/2022 in setting of hospitalization for resp illness/RSV. CHA2DS2VASc = 4-->eliquis ; b. 08/2022 s/p DCCV (100J); c. 09/26/2022 Recurrent Aflutter.   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 02/2022 Carotid U/S: 1-39% bilat ICA stenoses.   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   Contracture of palmar fascia 12/07/2020   COPD (chronic obstructive pulmonary disease) (HCC)    Corn of toe 12/07/2020   Corns and callosities 04/09/2018   Coronary artery disease    2019 with stents   Coronary atherosclerosis 05/07/2018   Degenerative disc  disease, 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   Degenerative disc disease, 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   Diarrhea 04/09/2018   Double vision with both eyes open 12/07/2020   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   Dysphagia 08/14/2018   Dyspnea    ED (erectile dysfunction)    ED (erectile dysfunction) of organic origin 12/07/2020   Elevated prostate specific antigen (PSA) 04/08/2018   Elevated PSA    Encounter for screening for other disorder 12/07/2020   Enlarged prostate without lower urinary tract symptoms (luts) 04/09/2018   Enthesopathy 12/07/2020   Essential (primary) hypertension 04/08/2018   Ex-smoker 04/09/2018   Exertional dyspnea 04/02/2018   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 07/2022 Echo: EF 65-70%, no rwma, mild LVH, nl RV fxn.   Hyperlipidemia    Hypertension    Hypoxemia 12/07/2020   Kidney cysts    Left knee pain 12/07/2020   Low back pain 04/08/2018   Male erectile dysfunction 04/09/2018   Mixed hyperlipidemia 04/08/2018   Nocturia more than twice per night 04/09/2018   Osteoarthritis of first carpometacarpal joint 04/08/2018   Osteoarthritis of knee 12/07/2020   Other long term (current) drug therapy 12/07/2020   Pain due to onychomycosis of toenail of left foot 07/20/2019   Pain in joint 04/09/2018   Pain in knee 04/09/2018   Pain in unspecified knee 12/07/2020   Pain of finger 04/09/2018   Palpitations    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HCC) 12/07/2020   Personal history of colonic polyps 12/07/2020   Pneumonia    Pneumothorax 04/12/2020   Polypharmacy 04/09/2018   Porokeratosis 01/13/2019   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HCC)    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   Pulmonary nodules 06/08/2016   Pure hypercholesterolemia 12/07/2020   Renal cyst 12/07/2020   Sleep  disorder 04/08/2018   Status post bronchoscopy 04/12/2020   Tear of rotator cuff 04/09/2018   Tobacco user 12/07/2020   Uncomplicated alcohol dependence (HCC) 04/09/2018   Unspecified rotator cuff tear or rupture of unspecified shoulder, not specified as traumatic 12/07/2020   Unspecified tear of unspecified meniscus, current injury, unspecified knee, initial encounter 12/07/2020   Visual disturbance 12/07/2020   Vitamin D  deficiency 04/08/2018   Past Surgical History:  Procedure Laterality Date   BRONCHIAL BIOPSY  10/13/2019   Procedure: BRONCHIAL BIOPSIES;  Surgeon: Shelah Lamar RAMAN, MD;  Location: [facility] ENDOSCOPY;  Service: Pulmonary;;   BRONCHIAL BIOPSY  04/12/2020   Procedure: BRONCHIAL BIOPSIES;  Surgeon: Shelah Lamar RAMAN, MD;  Location: [facility] ENDOSCOPY;  Service: Pulmonary;;   BRONCHIAL BRUSHINGS  10/13/2019   Procedure: BRONCHIAL BRUSHINGS;  Surgeon: Shelah Lamar RAMAN, MD;  Location: [facility] ENDOSCOPY;  Service: Pulmonary;;   BRONCHIAL BRUSHINGS  04/12/2020   Procedure: BRONCHIAL BRUSHINGS;  Surgeon: Shelah Lamar RAMAN, MD;  Location: [facility] ENDOSCOPY;  Service: Pulmonary;;   BRONCHIAL NEEDLE ASPIRATION BIOPSY  10/13/2019   Procedure:  BRONCHIAL NEEDLE ASPIRATION BIOPSIES;  Surgeon: Shelah Lamar RAMAN, MD;  Location: [facility] ENDOSCOPY;  Service: Pulmonary;;   BRONCHIAL NEEDLE ASPIRATION BIOPSY  04/12/2020   Procedure: BRONCHIAL NEEDLE ASPIRATION BIOPSIES;  Surgeon: Shelah Lamar RAMAN, MD;  Location: [facility] ENDOSCOPY;  Service: Pulmonary;;   BRONCHIAL WASHINGS  10/13/2019   Procedure: BRONCHIAL WASHINGS;  Surgeon: Shelah Lamar RAMAN, MD;  Location: [facility] ENDOSCOPY;  Service: Pulmonary;;   BRONCHIAL WASHINGS  04/12/2020   Procedure: BRONCHIAL WASHINGS;  Surgeon: Shelah Lamar RAMAN, MD;  Location: [facility] ENDOSCOPY;  Service: Pulmonary;;   CARDIAC CATHETERIZATION  2002   50% RCA   CARDIOVERSION N/A 09/17/2022   Procedure: CARDIOVERSION;  Surgeon: Darron Deatrice LABOR, MD;  Location: [facility] ORS;  Service: Cardiovascular;  Laterality: N/A;    CATARACT EXTRACTION, BILATERAL     CORONARY STENT INTERVENTION N/A 04/15/2018   Procedure: CORONARY STENT INTERVENTION;  Surgeon: Dann Candyce RAMAN, MD;  Location: [facility] INVASIVE CV LAB;  Service: Cardiovascular;  Laterality: N/A;  om1   EYE SURGERY     KNEE ARTHROSCOPY     LEFT HEART CATH AND CORONARY ANGIOGRAPHY N/A 04/15/2018   Procedure: LEFT HEART CATH AND CORONARY ANGIOGRAPHY;  Surgeon: Dann Candyce RAMAN, MD;  Location: [facility] INVASIVE CV LAB;  Service: Cardiovascular;  Laterality: N/A;   MULTIPLE TOOTH EXTRACTIONS     TONSILLECTOMY     VIDEO BRONCHOSCOPY  04/12/2020   VIDEO BRONCHOSCOPY WITH ENDOBRONCHIAL NAVIGATION N/A 07/26/2016   Procedure: VIDEO BRONCHOSCOPY WITH ENDOBRONCHIAL NAVIGATION;  Surgeon: Lamar RAMAN Shelah, MD;  Location: [facility] OR;  Service: Thoracic;  Laterality: N/A;   VIDEO BRONCHOSCOPY WITH ENDOBRONCHIAL NAVIGATION N/A 10/13/2019   Procedure: [facility] AND VIDEO BRONCHOSCOPY WITH ENDOBRONCHIAL NAVIGATION;  Surgeon: Shelah Lamar RAMAN, MD;  Location: [facility] ENDOSCOPY;  Service: Pulmonary;  Laterality: N/A;   VIDEO BRONCHOSCOPY WITH ENDOBRONCHIAL NAVIGATION N/A 04/12/2020   Procedure: VIDEO BRONCHOSCOPY WITH ENDOBRONCHIAL NAVIGATION;  Surgeon: Shelah Lamar RAMAN, MD;  Location: [facility] ENDOSCOPY;  Service: Pulmonary;  Laterality: N/A;    Allergies  Allergen Reactions   Flagyl [Metronidazole] Other (See Comments)    Other reaction(s): skin reaction    Outpatient Encounter Medications as of 09/06/2023  Medication Sig   acetaminophen  (TYLENOL ) 325 MG tablet Take 650 mg by mouth every 4 (four) hours as needed.   albuterol  (VENTOLIN  HFA) 108 (90 Base) MCG/ACT inhaler INHALE 2 PUFFS 2-3 TIMES A DAY AS NEEDED FOR WHEEZING (30 DAYS) 30 DAY(S)   apixaban  (ELIQUIS ) 5 MG TABS tablet Take 1 tablet (5 mg total) by mouth 2 (two) times daily.   Budeson-Glycopyrrol-Formoterol  (BREZTRI  AEROSPHERE) 160-9-4.8 MCG/ACT AERO Inhale 2 puffs into the lungs 2 (two) times daily.   clonazePAM  (KLONOPIN ) 0.5 MG tablet  Take 1 tablet (0.5 mg total) by mouth at bedtime.   diltiazem  (CARDIZEM  CD) 120 MG 24 hr capsule TAKE 1 CAPSULE BY MOUTH 2 TIMES DAILY.   dofetilide  (TIKOSYN ) 250 MCG capsule Take 1 capsule (250 mcg total) by mouth 2 (two) times daily.   finasteride  (PROSCAR ) 5 MG tablet Take 5 mg by mouth daily.   furosemide  (LASIX ) 20 MG tablet Take 2 tablets (40 mg total) by mouth as directed. Take 2 tablets in the morning (40 mg) for 4 days.   gabapentin  (NEURONTIN ) 100 MG capsule Take 200 mg by mouth at bedtime.   ipratropium-albuterol  (DUONEB) 0.5-2.5 (3) MG/3ML SOLN INHALE 3 ML BY NEBULIZER EVERY 6 HOURS AS NEEDED   morphine  (ROXANOL) 20 MG/ML concentrated solution Take 0.25 mLs (5 mg total) by mouth daily as needed.  nitroGLYCERIN  (NITROSTAT ) 0.4 MG SL tablet Place 1 tablet (0.4 mg total) under the tongue every 5 (five) minutes as needed for chest pain.   OXYGEN  Inhale into the lungs. 3-4 liters upon exertion and 3 liters continuous at night.   pantoprazole  (PROTONIX ) 40 MG tablet Take 1 tablet (40 mg total) by mouth daily.   polyethylene glycol (MIRALAX  / GLYCOLAX ) 17 g packet Take 17 g by mouth daily as needed for moderate constipation.   predniSONE  (DELTASONE ) 10 MG tablet Take 4 tablets (40 mg total) by mouth daily for 3 days, THEN 2 tablets (20 mg total) daily for 3 days, THEN 1 tablet (10 mg total) daily for 3 days.   rosuvastatin  (CRESTOR ) 20 MG tablet Take 1 tablet (20 mg total) by mouth daily.   silver  sulfADIAZINE  (SILVADENE ) 1 % cream Apply 1 Application topically daily.   sodium chloride  HYPERTONIC 3 % nebulizer solution Take by nebulization in the morning and at bedtime.   Docusate Calcium  (STOOL SOFTENER PO) Take 1 tablet by mouth every evening.   ipratropium (ATROVENT ) 0.03 % nasal spray Place 2 sprays into both nostrils every 12 (twelve) hours. (Patient not taking: Reported on 09/06/2023)   No facility-administered encounter medications on file as of 09/06/2023.    Review of  Systems  Immunization History  Administered Date(s) Administered   Fluad Quad(high Dose 65+) 04/13/2020, 03/29/2021, 05/18/2022   Influenza Split 04/19/2009, 04/26/2010, 05/14/2011, 07/19/2011, 05/06/2015, 02/28/2019, 04/13/2020   Influenza, High Dose Seasonal PF 04/02/2018, 03/11/2019, 04/13/2020, 06/17/2023   Influenza,inj,Quad PF,6+ Mos 03/30/2016   PFIZER(Purple Top)SARS-COV-2 Vaccination 08/13/2019, 09/01/2019, 05/24/2020, 09/13/2020   Pfizer Covid-19 Vaccine Bivalent Booster 34yrs & up 04/20/2021, 05/27/2021   Pfizer(Comirnaty)Fall Seasonal Vaccine 12 years and older 08/04/2023   Pneumococcal Conjugate-13 04/29/2014   Pneumococcal Polysaccharide-23 09/12/2005, 11/27/2011, 05/01/2013   Zoster, Live 04/13/2008   Pertinent  Health Maintenance Due  Topic Date Due   INFLUENZA VACCINE  Completed      04/13/2020   12:00 PM 09/12/2020   11:17 AM 08/12/2022    1:02 PM 09/18/2022    9:26 AM 07/15/2023   10:28 AM  Fall Risk  Falls in the past year?  0  0 0  Was there an injury with Fall?    0 0  Fall Risk Category Calculator    0 0  (RETIRED) Patient Fall Risk Level Moderate fall risk Moderate fall risk Low fall risk    Patient at Risk for Falls Due to  Medication side effect  No Fall Risks Impaired balance/gait;Impaired mobility  Fall risk Follow up  Falls prevention discussed;Education provided  Falls evaluation completed Falls evaluation completed   Functional Status Survey:    Vitals:   09/06/23 1240  BP: 128/60  Pulse: 77  Temp: 97.8 F (36.6 C)  Weight: 147 lb (66.7 kg)  Height: 5' 10 (1.778 m)   Body mass index is 21.09 kg/m. Physical Exam Physical Exam VITALS: P- 77, BP- 128/60 SKIN: Stage two pressure injury present. Labs reviewed: Recent Labs    10/01/22 1234 03/04/23 1126 07/15/23 1114 08/23/23 1325 08/24/23 0430 08/25/23 0527 08/26/23 0807 08/28/23 0937 08/29/23 0505 09/01/23 0535  NA 141   < > 144   < > 139 137 136  --  139 138  K 4.2   < > 4.8    < > 3.6 3.9 4.3 4.2 4.9 5.2*  CL 97   < > 101   < > 94* 96* 98  --  98 98  CO2 35*   < >  34*   < > 31 31 30   --  34* 33*  GLUCOSE 90   < > 90   < > 162* 179* 172*  --  119* 117*  BUN 21   < > 26*   < > 26* 40* 49*  --  34* 29*  CREATININE 1.04   < > 1.01   < > 1.00 1.10 1.12  --  0.88 0.77  CALCIUM  9.2   < > 9.2   < > 8.4* 8.6* 8.8*  --  8.4* 8.3*  MG  --    < >  --   --  2.5* 2.4 2.3  --   --   --   PHOS 3.3  --  3.6  --   --   --   --   --   --   --    < > = values in this interval not displayed.   Recent Labs    09/22/22 1125 10/01/22 1234 07/15/23 1114 08/23/23 1325  AST 20  --   --  25  ALT 15  --   --  18  ALKPHOS 49  --   --  61  BILITOT 0.5  --   --  0.4  PROT 6.0*  --   --  6.9  ALBUMIN 3.2* 3.5 3.8 3.6   Recent Labs    05/31/23 1530 06/01/23 0730 08/23/23 1325 08/24/23 0430 09/01/23 0535  WBC 13.3*   < > 13.5* 5.6 13.4*  NEUTROABS 11.9*  --  12.2*  --  11.3*  HGB 12.3*   < > 13.6 11.8* 12.0*  HCT 36.6*   < > 43.0 36.0* 36.0*  MCV 92.4   < > 96.6 93.0 91.4  PLT 136*   < > 304 249 282   < > = values in this interval not displayed.   Lab Results  Component Value Date   TSH 0.700 08/12/2022   Lab Results  Component Value Date   HGBA1C 4.8 07/15/2023   Lab Results  Component Value Date   CHOL 141 08/13/2022   HDL 80 08/13/2022   LDLCALC 52 08/13/2022   TRIG 46 08/13/2022   CHOLHDL 1.8 08/13/2022    Significant Diagnostic Results in last 30 days:  ECHOCARDIOGRAM COMPLETE Result Date: 08/26/2023    ECHOCARDIOGRAM REPORT   Patient Name:   Denney Shein. Date of Exam: 08/25/2023 Medical Rec #:  [id number]            Height:       70.5 in Accession #:    [id number]           Weight:       155.0 lb Date of Birth:  [birth year]            BSA:          1.883 m Patient Age:    82 years             BP:           139/77 mmHg Patient Gender: M                    HR:           90 bpm. Exam Location:  [facility] Procedure: 2D Echo, Cardiac Doppler and Color Doppler  Indications:     Acytre respiratory distress R06.03  History:         Patient has prior history of Echocardiogram examinations, most  recent 08/07/2022. CAD, COPD, Arrythmias:Atrial Fibrillation and                  Atrial Flutter; Risk Factors:Hypertension and Dyslipidemia.  Sonographer:     Thea Norlander RCS Referring Phys:  [id number] CONCEPCION RISER Diagnosing Phys: Annabella Scarce MD  Sonographer Comments: Image acquisition challenging due to respiratory motion. IMPRESSIONS  1. Left ventricular ejection fraction, by estimation, is 60 to 65%. The left ventricle has normal function. The left ventricle has no regional wall motion abnormalities. Left ventricular diastolic parameters are indeterminate.  2. Right ventricular systolic function is normal. The right ventricular size is normal. There is mildly elevated pulmonary artery systolic pressure.  3. The mitral valve is normal in structure. No evidence of mitral valve regurgitation. No evidence of mitral stenosis.  4. The aortic valve is tricuspid. Aortic valve regurgitation is not visualized. No aortic stenosis is present.  5. The inferior vena cava is normal in size with greater than 50% respiratory variability, suggesting right atrial pressure of 3 mmHg. FINDINGS  Left Ventricle: Left ventricular ejection fraction, by estimation, is 60 to 65%. The left ventricle has normal function. The left ventricle has no regional wall motion abnormalities. The left ventricular internal cavity size was normal in size. There is  no left ventricular hypertrophy. Left ventricular diastolic parameters are indeterminate. Indeterminate filling pressures. Right Ventricle: The right ventricular size is normal. No increase in right ventricular wall thickness. Right ventricular systolic function is normal. There is mildly elevated pulmonary artery systolic pressure. The tricuspid regurgitant velocity is 2.97  m/s, and with an assumed right atrial pressure of 3  mmHg, the estimated right ventricular systolic pressure is 38.3 mmHg. Left Atrium: Left atrial size was normal in size. Right Atrium: Right atrial size was normal in size. Pericardium: There is no evidence of pericardial effusion. Mitral Valve: The mitral valve is normal in structure. No evidence of mitral valve regurgitation. No evidence of mitral valve stenosis. Tricuspid Valve: The tricuspid valve is normal in structure. Tricuspid valve regurgitation is trivial. No evidence of tricuspid stenosis. Aortic Valve: The aortic valve is tricuspid. Aortic valve regurgitation is not visualized. No aortic stenosis is present. Aortic valve peak gradient measures 5.1 mmHg. Pulmonic Valve: The pulmonic valve was normal in structure. Pulmonic valve regurgitation is not visualized. No evidence of pulmonic stenosis. Aorta: The aortic root is normal in size and structure. Venous: The inferior vena cava is normal in size with greater than 50% respiratory variability, suggesting right atrial pressure of 3 mmHg. IAS/Shunts: No atrial level shunt detected by color flow Doppler.  LEFT VENTRICLE PLAX 2D LVIDd:         4.20 cm   Diastology LVIDs:         2.60 cm   LV e' medial:    9.14 cm/s LV PW:         1.10 cm   LV E/e' medial:  10.9 LV IVS:        0.80 cm   LV e' lateral:   8.27 cm/s LVOT diam:     2.00 cm   LV E/e' lateral: 12.1 LV SV:         53 LV SV Index:   28 LVOT Area:     3.14 cm  RIGHT VENTRICLE            IVC RV S prime:     9.79 cm/s  IVC diam: 1.40 cm TAPSE (M-mode): 2.5 cm LEFT ATRIUM  Index LA diam:    3.10 cm 1.65 cm/m  AORTIC VALVE AV Area (Vmax): 2.10 cm AV Vmax:        113.00 cm/s AV Peak Grad:   5.1 mmHg LVOT Vmax:      75.63 cm/s LVOT Vmean:     49.867 cm/s LVOT VTI:       0.168 m  AORTA Ao Root diam: 3.20 cm Ao Asc diam:  3.10 cm MITRAL VALVE                TRICUSPID VALVE MV Area (PHT): 3.77 cm     TR Peak grad:   35.3 mmHg MV Decel Time: 201 msec     TR Vmax:        297.00 cm/s MV E velocity: 100.00  cm/s MV A velocity: 99.00 cm/s   SHUNTS MV E/A ratio:  1.01         Systemic VTI:  0.17 m                             Systemic Diam: 2.00 cm Annabella Scarce MD Electronically signed by Annabella Scarce MD Signature Date/Time: 08/26/2023/7:51:48 AM    Final    DG Chest Portable 1 View Result Date: 08/23/2023 CLINICAL DATA:  Shortness of breath EXAM: PORTABLE CHEST 1 VIEW COMPARISON:  Chest radiograph dated 05/31/2023 FINDINGS: Hyperinflated lungs. Asymmetric lucency of the left upper lung. No focal consolidations. No pleural effusion or pneumothorax. The heart size and mediastinal contours are within normal limits. No acute osseous abnormality. IMPRESSION: 1. Hyperinflated lungs with asymmetric lucency of the left upper lung, in keeping with emphysematous changes. 2. No focal consolidations. Electronically Signed   By: Limin  Xu M.D.   On: 08/23/2023 14:30    Assessment/Plan Pressure Injury (Stage 2) Stage 2 pressure injury on the buttocks due to prolonged sitting and pressure. Reports discomfort and irritation. Wound care team involved, using prescribed cream with zinc  oxide and menthol, effective in the past. Discussed importance of pressure relief to prevent further injury. Explained that padding and alternating pressure load can aid healing and prevent worsening. - Continue prescribed cream with zinc  oxide and menthol - Apply padding to relieve pressure - Alternate pressure load with a pillow periodically  Peripheral Edema Swelling in hands and legs due to fluid shifts and prolonged sitting. Swelling in hands improving after three days. No signs of blood clots. Discussed benefits of elevation and compression. Explained that compression socks and gloves can reduce swelling but should be comfortable. - Elevate hands and legs - Consider TED hose for leg compression - Order compression glove for hand if swelling persists  Hypotension Episodes of low blood pressure, recent reading 99/40. On  diuretics (Lasix ) and tapering prednisone . Low blood pressure may be related to fluid shifts and diuretic use. Discussed risks of hypotension, including dizziness and falls. Explained that adjusting diuretic dosage and continuing prednisone  taper can help manage blood pressure. Emphasized regular blood pressure monitoring. - Review and possibly adjust diuretic (Lasix ) dosage-- he will have 40 mg every other day  - Continue prednisone  taper, aiming for 5 mg for two months - Monitor blood pressure regularly  General Health Maintenance General health maintenance not explicitly discussed.  Family/ staff Communication: nursing, spouse  Labs/tests ordered:  none

## 2023-09-07 ENCOUNTER — Encounter: Payer: Self-pay | Admitting: Student

## 2023-09-07 DIAGNOSIS — I2510 Atherosclerotic heart disease of native coronary artery without angina pectoris: Secondary | ICD-10-CM | POA: Diagnosis not present

## 2023-09-07 DIAGNOSIS — J441 Chronic obstructive pulmonary disease with (acute) exacerbation: Secondary | ICD-10-CM | POA: Diagnosis not present

## 2023-09-07 DIAGNOSIS — R2689 Other abnormalities of gait and mobility: Secondary | ICD-10-CM | POA: Diagnosis not present

## 2023-09-07 DIAGNOSIS — I4891 Unspecified atrial fibrillation: Secondary | ICD-10-CM | POA: Diagnosis not present

## 2023-09-07 DIAGNOSIS — M6281 Muscle weakness (generalized): Secondary | ICD-10-CM | POA: Diagnosis not present

## 2023-09-07 DIAGNOSIS — Z9981 Dependence on supplemental oxygen: Secondary | ICD-10-CM | POA: Diagnosis not present

## 2023-09-07 DIAGNOSIS — R278 Other lack of coordination: Secondary | ICD-10-CM | POA: Diagnosis not present

## 2023-09-07 DIAGNOSIS — J9621 Acute and chronic respiratory failure with hypoxia: Secondary | ICD-10-CM | POA: Diagnosis not present

## 2023-09-07 DIAGNOSIS — R2681 Unsteadiness on feet: Secondary | ICD-10-CM | POA: Diagnosis not present

## 2023-09-07 DIAGNOSIS — I4811 Longstanding persistent atrial fibrillation: Secondary | ICD-10-CM | POA: Diagnosis not present

## 2023-09-07 DIAGNOSIS — Z741 Need for assistance with personal care: Secondary | ICD-10-CM | POA: Diagnosis not present

## 2023-09-07 DIAGNOSIS — J431 Panlobular emphysema: Secondary | ICD-10-CM | POA: Diagnosis not present

## 2023-09-08 DIAGNOSIS — Z741 Need for assistance with personal care: Secondary | ICD-10-CM | POA: Diagnosis not present

## 2023-09-08 DIAGNOSIS — R2681 Unsteadiness on feet: Secondary | ICD-10-CM | POA: Diagnosis not present

## 2023-09-08 DIAGNOSIS — R278 Other lack of coordination: Secondary | ICD-10-CM | POA: Diagnosis not present

## 2023-09-08 DIAGNOSIS — J431 Panlobular emphysema: Secondary | ICD-10-CM | POA: Diagnosis not present

## 2023-09-08 DIAGNOSIS — I2510 Atherosclerotic heart disease of native coronary artery without angina pectoris: Secondary | ICD-10-CM | POA: Diagnosis not present

## 2023-09-08 DIAGNOSIS — J9621 Acute and chronic respiratory failure with hypoxia: Secondary | ICD-10-CM | POA: Diagnosis not present

## 2023-09-08 DIAGNOSIS — J441 Chronic obstructive pulmonary disease with (acute) exacerbation: Secondary | ICD-10-CM | POA: Diagnosis not present

## 2023-09-08 DIAGNOSIS — R2689 Other abnormalities of gait and mobility: Secondary | ICD-10-CM | POA: Diagnosis not present

## 2023-09-08 DIAGNOSIS — I4891 Unspecified atrial fibrillation: Secondary | ICD-10-CM | POA: Diagnosis not present

## 2023-09-08 DIAGNOSIS — Z9981 Dependence on supplemental oxygen: Secondary | ICD-10-CM | POA: Diagnosis not present

## 2023-09-08 DIAGNOSIS — I4811 Longstanding persistent atrial fibrillation: Secondary | ICD-10-CM | POA: Diagnosis not present

## 2023-09-08 DIAGNOSIS — M6281 Muscle weakness (generalized): Secondary | ICD-10-CM | POA: Diagnosis not present

## 2023-09-09 DIAGNOSIS — Z741 Need for assistance with personal care: Secondary | ICD-10-CM | POA: Diagnosis not present

## 2023-09-09 DIAGNOSIS — M6281 Muscle weakness (generalized): Secondary | ICD-10-CM | POA: Diagnosis not present

## 2023-09-09 DIAGNOSIS — R278 Other lack of coordination: Secondary | ICD-10-CM | POA: Diagnosis not present

## 2023-09-09 DIAGNOSIS — J431 Panlobular emphysema: Secondary | ICD-10-CM | POA: Diagnosis not present

## 2023-09-09 DIAGNOSIS — R2681 Unsteadiness on feet: Secondary | ICD-10-CM | POA: Diagnosis not present

## 2023-09-09 DIAGNOSIS — J441 Chronic obstructive pulmonary disease with (acute) exacerbation: Secondary | ICD-10-CM | POA: Diagnosis not present

## 2023-09-09 DIAGNOSIS — I2510 Atherosclerotic heart disease of native coronary artery without angina pectoris: Secondary | ICD-10-CM | POA: Diagnosis not present

## 2023-09-09 DIAGNOSIS — R2689 Other abnormalities of gait and mobility: Secondary | ICD-10-CM | POA: Diagnosis not present

## 2023-09-09 DIAGNOSIS — I4811 Longstanding persistent atrial fibrillation: Secondary | ICD-10-CM | POA: Diagnosis not present

## 2023-09-09 DIAGNOSIS — J9621 Acute and chronic respiratory failure with hypoxia: Secondary | ICD-10-CM | POA: Diagnosis not present

## 2023-09-09 DIAGNOSIS — Z9981 Dependence on supplemental oxygen: Secondary | ICD-10-CM | POA: Diagnosis not present

## 2023-09-09 DIAGNOSIS — I4891 Unspecified atrial fibrillation: Secondary | ICD-10-CM | POA: Diagnosis not present

## 2023-09-10 ENCOUNTER — Non-Acute Institutional Stay (SKILLED_NURSING_FACILITY): Payer: Medicare Other | Admitting: Nurse Practitioner

## 2023-09-10 ENCOUNTER — Encounter: Payer: Self-pay | Admitting: Nurse Practitioner

## 2023-09-10 DIAGNOSIS — B351 Tinea unguium: Secondary | ICD-10-CM

## 2023-09-10 DIAGNOSIS — J9621 Acute and chronic respiratory failure with hypoxia: Secondary | ICD-10-CM | POA: Diagnosis not present

## 2023-09-10 DIAGNOSIS — Z9981 Dependence on supplemental oxygen: Secondary | ICD-10-CM | POA: Diagnosis not present

## 2023-09-10 DIAGNOSIS — Z741 Need for assistance with personal care: Secondary | ICD-10-CM | POA: Diagnosis not present

## 2023-09-10 DIAGNOSIS — J431 Panlobular emphysema: Secondary | ICD-10-CM | POA: Diagnosis not present

## 2023-09-10 DIAGNOSIS — R278 Other lack of coordination: Secondary | ICD-10-CM | POA: Diagnosis not present

## 2023-09-10 DIAGNOSIS — R2689 Other abnormalities of gait and mobility: Secondary | ICD-10-CM | POA: Diagnosis not present

## 2023-09-10 DIAGNOSIS — I4811 Longstanding persistent atrial fibrillation: Secondary | ICD-10-CM | POA: Diagnosis not present

## 2023-09-10 DIAGNOSIS — I2510 Atherosclerotic heart disease of native coronary artery without angina pectoris: Secondary | ICD-10-CM | POA: Diagnosis not present

## 2023-09-10 DIAGNOSIS — J441 Chronic obstructive pulmonary disease with (acute) exacerbation: Secondary | ICD-10-CM | POA: Diagnosis not present

## 2023-09-10 DIAGNOSIS — I4891 Unspecified atrial fibrillation: Secondary | ICD-10-CM | POA: Diagnosis not present

## 2023-09-10 DIAGNOSIS — M6281 Muscle weakness (generalized): Secondary | ICD-10-CM | POA: Diagnosis not present

## 2023-09-10 DIAGNOSIS — R2681 Unsteadiness on feet: Secondary | ICD-10-CM | POA: Diagnosis not present

## 2023-09-10 NOTE — Progress Notes (Deleted)
Location:  Other [place].  Nursing Home Room Number: [facility] [address]A Place of Service:  SNF [phone]) Abbey Chatters, NP  PCP: [name], MD  Patient Care Team: [name], MD as PCP - General (Internal Medicine) Iran Ouch, MD as PCP - Cardiology (Cardiology) Lanier Prude, MD as PCP - Electrophysiology (Cardiology) Otho Ket, RN as [facility] Management  Extended Emergency Contact Information Primary Emergency Contact: Patil,Susan Address: [address]          [place], [place] [address] [place] of [place] Home Phone: [phone] Mobile Phone: [phone] Relation: Spouse  Goals of care: Advanced Directive information    09/10/2023   11:20 AM  Advanced Directives  Does Patient Have a Medical Advance Directive? Yes  Type of Advance Directive Out of facility DNR (pink MOST or yellow form)  Does patient want to make changes to medical advance directive? No - Patient declined     Chief Complaint  Patient presents with   Acute Visit    Toenails.     HPI:  Pt is a 83 y.o. male seen today for an acute visit for Toenails.    Past Medical History:  Diagnosis Date   Abdominal discomfort 12/07/2020   Acquired trigger finger of right middle finger 02/04/2020   Acute prostatitis 04/08/2018   Alcohol abuse    Alcohol dependence (HCC) 12/07/2020   Anal or rectal pain 04/08/2018   Annual physical exam 12/07/2020   Anorectal disorder 12/07/2020   Arthritis    Atherosclerotic heart disease of native coronary artery without angina pectoris 04/08/2018   a. 03/2018 PCI: LM nl, LAD 76m, LCX nl, OM1 70p (2.75x8 Synergy DES), 83m (2.5x24 Synergy DES), RCA 25p, RPL1 50. EF 55-65%.   Atrial flutter (HCC)    a. 07/2022 in setting of hospitalization for resp illness/RSV. CHA2DS2VASc = 4-->eliquis; b. 08/2022 s/p DCCV (100J); c. 09/26/2022 Recurrent Aflutter.   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 02/2022 Carotid U/S: 1-39% bilat ICA stenoses.   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   Contracture of palmar fascia 12/07/2020   COPD (chronic obstructive pulmonary disease) (HCC)    Corn of toe 12/07/2020   Corns and callosities 04/09/2018   Coronary artery disease    2019 with stents   Coronary atherosclerosis 05/07/2018   Degenerative disc disease, 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   Degenerative disc disease, 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   Diarrhea 04/09/2018   Double vision with both eyes open 12/07/2020   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   Dysphagia 08/14/2018   Dyspnea    ED (erectile dysfunction)    ED (erectile dysfunction) of organic origin 12/07/2020   Elevated prostate specific antigen (PSA) 04/08/2018   Elevated PSA    Encounter for screening for other disorder 12/07/2020   Enlarged prostate without lower urinary tract symptoms (luts) 04/09/2018   Enthesopathy 12/07/2020   Essential (primary) hypertension 04/08/2018   Ex-smoker 04/09/2018   Exertional dyspnea 04/02/2018   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   Flatulence 04/08/2018   Gastro-esophageal reflux disease  without esophagitis 04/08/2018   GERD (gastroesophageal reflux disease)    History of acute otitis externa 08/14/2018   History of echocardiogram    a. 07/2022 Echo: EF 65-70%, no rwma, mild LVH, nl RV fxn.   Hyperlipidemia    Hypertension    Hypoxemia 12/07/2020   Kidney cysts    Left knee pain 12/07/2020   Low back pain 04/08/2018   Male erectile dysfunction  04/09/2018   Mixed hyperlipidemia 04/08/2018   Nocturia more than twice per night 04/09/2018   Osteoarthritis of first carpometacarpal joint 04/08/2018   Osteoarthritis of knee 12/07/2020   Other long term (current) drug therapy 12/07/2020   Pain due to onychomycosis of toenail of left foot 07/20/2019   Pain in joint 04/09/2018   Pain in knee 04/09/2018   Pain in unspecified knee 12/07/2020   Pain of finger 04/09/2018   Palpitations    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HCC) 12/07/2020   Personal history of colonic polyps 12/07/2020   Pneumonia    Pneumothorax 04/12/2020   Polypharmacy 04/09/2018   Porokeratosis 01/13/2019   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HCC)    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   Pulmonary nodules 06/08/2016   Pure hypercholesterolemia 12/07/2020   Renal cyst 12/07/2020   Sleep disorder 04/08/2018   Status post bronchoscopy 04/12/2020   Tear of rotator cuff 04/09/2018   Tobacco user 12/07/2020   Uncomplicated alcohol dependence (HCC) 04/09/2018   Unspecified rotator cuff tear or rupture of unspecified shoulder, not specified as traumatic 12/07/2020   Unspecified tear of unspecified meniscus, current injury, unspecified knee, initial encounter 12/07/2020   Visual disturbance 12/07/2020   Vitamin D deficiency 04/08/2018   Past Surgical History:  Procedure Laterality Date   BRONCHIAL BIOPSY  10/13/2019   Procedure: BRONCHIAL BIOPSIES;  Surgeon: Leslye Peer, MD;  Location: [facility] ENDOSCOPY;  Service: Pulmonary;;   BRONCHIAL BIOPSY  04/12/2020   Procedure: BRONCHIAL BIOPSIES;  Surgeon: Leslye Peer, MD;  Location: [facility] ENDOSCOPY;  Service: Pulmonary;;   BRONCHIAL BRUSHINGS  10/13/2019   Procedure: BRONCHIAL BRUSHINGS;  Surgeon: Leslye Peer, MD;  Location: [facility] ENDOSCOPY;  Service: Pulmonary;;   BRONCHIAL BRUSHINGS  04/12/2020   Procedure: BRONCHIAL BRUSHINGS;  Surgeon: Leslye Peer, MD;  Location: [facility] ENDOSCOPY;  Service: Pulmonary;;   BRONCHIAL NEEDLE ASPIRATION BIOPSY   10/13/2019   Procedure: BRONCHIAL NEEDLE ASPIRATION BIOPSIES;  Surgeon: Leslye Peer, MD;  Location: [facility] ENDOSCOPY;  Service: Pulmonary;;   BRONCHIAL NEEDLE ASPIRATION BIOPSY  04/12/2020   Procedure: BRONCHIAL NEEDLE ASPIRATION BIOPSIES;  Surgeon: Leslye Peer, MD;  Location: [facility] ENDOSCOPY;  Service: Pulmonary;;   BRONCHIAL WASHINGS  10/13/2019   Procedure: BRONCHIAL WASHINGS;  Surgeon: Leslye Peer, MD;  Location: [facility] ENDOSCOPY;  Service: Pulmonary;;   BRONCHIAL WASHINGS  04/12/2020   Procedure: BRONCHIAL WASHINGS;  Surgeon: Leslye Peer, MD;  Location: [facility] ENDOSCOPY;  Service: Pulmonary;;   CARDIAC CATHETERIZATION  2002   50% RCA   CARDIOVERSION N/A 09/17/2022   Procedure: CARDIOVERSION;  Surgeon: Iran Ouch, MD;  Location: [facility] ORS;  Service: Cardiovascular;  Laterality: N/A;   CATARACT EXTRACTION, BILATERAL     CORONARY STENT INTERVENTION N/A 04/15/2018   Procedure: CORONARY STENT INTERVENTION;  Surgeon: Corky Crafts, MD;  Location: [facility] INVASIVE CV LAB;  Service: Cardiovascular;  Laterality: N/A;  om1   EYE SURGERY     KNEE ARTHROSCOPY     LEFT HEART CATH AND CORONARY ANGIOGRAPHY N/A 04/15/2018   Procedure: LEFT HEART CATH AND CORONARY ANGIOGRAPHY;  Surgeon: Eldridge Dace,  Donnie Coffin, MD;  Location: [facility] INVASIVE CV LAB;  Service: Cardiovascular;  Laterality: N/A;   MULTIPLE TOOTH EXTRACTIONS     TONSILLECTOMY     VIDEO BRONCHOSCOPY  04/12/2020   VIDEO BRONCHOSCOPY WITH ENDOBRONCHIAL NAVIGATION N/A 07/26/2016   Procedure: VIDEO BRONCHOSCOPY WITH ENDOBRONCHIAL NAVIGATION;  Surgeon: Leslye Peer, MD;  Location: [facility] OR;  Service: Thoracic;  Laterality: N/A;   VIDEO BRONCHOSCOPY WITH ENDOBRONCHIAL NAVIGATION N/A 10/13/2019   Procedure: [facility] AND VIDEO BRONCHOSCOPY WITH ENDOBRONCHIAL NAVIGATION;  Surgeon: Leslye Peer, MD;  Location: [facility] ENDOSCOPY;  Service: Pulmonary;  Laterality: N/A;   VIDEO BRONCHOSCOPY WITH ENDOBRONCHIAL NAVIGATION N/A 04/12/2020   Procedure: VIDEO  BRONCHOSCOPY WITH ENDOBRONCHIAL NAVIGATION;  Surgeon: Leslye Peer, MD;  Location: [facility] ENDOSCOPY;  Service: Pulmonary;  Laterality: N/A;    Allergies  Allergen Reactions   Flagyl [Metronidazole] Other (See Comments)    Other reaction(s): skin reaction    Outpatient Encounter Medications as of 09/10/2023  Medication Sig   acetaminophen (TYLENOL) 325 MG tablet Take 650 mg by mouth every 4 (four) hours as needed.   albuterol (VENTOLIN HFA) 108 (90 Base) MCG/ACT inhaler INHALE 2 PUFFS 2-3 TIMES A DAY AS NEEDED FOR WHEEZING (30 DAYS) 30 DAY(S)   apixaban (ELIQUIS) 5 MG TABS tablet Take 1 tablet (5 mg total) by mouth 2 (two) times daily.   Budeson-Glycopyrrol-Formoterol (BREZTRI AEROSPHERE) 160-9-4.8 MCG/ACT AERO Inhale 2 puffs into the lungs 2 (two) times daily.   clonazePAM (KLONOPIN) 0.5 MG tablet Take 1 tablet (0.5 mg total) by mouth at bedtime.   diltiazem (CARDIZEM CD) 120 MG 24 hr capsule TAKE 1 CAPSULE BY MOUTH 2 TIMES DAILY.   dofetilide (TIKOSYN) 250 MCG capsule Take 1 capsule (250 mcg total) by mouth 2 (two) times daily.   finasteride (PROSCAR) 5 MG tablet Take 5 mg by mouth daily.   furosemide (LASIX) 20 MG tablet Give Alternating Dose of 20mg /40mg  by mouth once daily.   gabapentin (NEURONTIN) 100 MG capsule Take 200 mg by mouth at bedtime.   ipratropium-albuterol (DUONEB) 0.5-2.5 (3) MG/3ML SOLN INHALE 3 ML BY NEBULIZER EVERY 6 HOURS AS NEEDED   morphine (ROXANOL) 20 MG/ML concentrated solution Take 0.25 mLs (5 mg total) by mouth daily as needed.   nitroGLYCERIN (NITROSTAT) 0.4 MG SL tablet Place 1 tablet (0.4 mg total) under the tongue every 5 (five) minutes as needed for chest pain.   OXYGEN Inhale into the lungs. 3-4 liters upon exertion and 3 liters continuous at night.   pantoprazole (PROTONIX) 40 MG tablet Take 1 tablet (40 mg total) by mouth daily.   polyethylene glycol (MIRALAX / GLYCOLAX) 17 g packet Take 17 g by mouth daily as needed for moderate constipation.    predniSONE (DELTASONE) 10 MG tablet Take 4 tablets (40 mg total) by mouth daily for 3 days, THEN 2 tablets (20 mg total) daily for 3 days, THEN 1 tablet (10 mg total) daily for 3 days.   rosuvastatin (CRESTOR) 20 MG tablet Take 1 tablet (20 mg total) by mouth daily.   senna (SENOKOT) 8.6 MG TABS tablet Take 2 tablets by mouth at bedtime.   silver sulfADIAZINE (SILVADENE) 1 % cream Apply 1 Application topically daily.   sodium chloride HYPERTONIC 3 % nebulizer solution Take by nebulization in the morning and at bedtime.   [DISCONTINUED] Docusate Calcium (STOOL SOFTENER PO) Take 1 tablet by mouth every evening.   [DISCONTINUED] furosemide (LASIX) 20 MG tablet Take 2 tablets (40 mg total) by mouth as directed. Take 2  tablets in the morning (40 mg) for 4 days.   No facility-administered encounter medications on file as of 09/10/2023.    Review of Systems***  Immunization History  Administered Date(s) Administered   Fluad Quad(high Dose 65+) 04/13/2020, 03/29/2021, 05/18/2022   Influenza Split 04/19/2009, 04/26/2010, 05/14/2011, 07/19/2011, 05/06/2015, 02/28/2019, 04/13/2020   Influenza, High Dose Seasonal PF 04/02/2018, 03/11/2019, 04/13/2020, 06/17/2023   Influenza,inj,Quad PF,6+ Mos 03/30/2016   PFIZER(Purple Top)SARS-COV-2 Vaccination 08/13/2019, 09/01/2019, 05/24/2020, 09/13/2020   Pfizer Covid-19 Vaccine Bivalent Booster 25yrs & up 04/20/2021, 05/27/2021   Pfizer(Comirnaty)Fall Seasonal Vaccine 12 years and older 08/04/2023   Pneumococcal Conjugate-13 04/29/2014   Pneumococcal Polysaccharide-23 09/12/2005, 11/27/2011, 05/01/2013   Zoster, Live 04/13/2008   Pertinent  Health Maintenance Due  Topic Date Due   INFLUENZA VACCINE  Completed      04/13/2020   12:00 PM 09/12/2020   11:17 AM 08/12/2022    1:02 PM 09/18/2022    9:26 AM 07/15/2023   10:28 AM  Fall Risk  Falls in the past year?  0  0 0  Was there an injury with Fall?    0 0  Fall Risk Category Calculator    0 0  (RETIRED)  Patient Fall Risk Level Moderate fall risk Moderate fall risk Low fall risk    Patient at Risk for Falls Due to  Medication side effect  No Fall Risks Impaired balance/gait;Impaired mobility  Fall risk Follow up  Falls prevention discussed;Education provided  Falls evaluation completed Falls evaluation completed   Functional Status Survey:    Vitals:   09/10/23 1112 09/10/23 1122  BP: (!) 154/88 (!) 112/52  Pulse: 82   Resp: 16   Temp: (!) 97.5 F (36.4 C)   SpO2: 97%   Weight: 147 lb 3.2 oz (66.8 kg)   Height: 5\' 10"  (1.778 m)    Body mass index is 21.12 kg/m. Physical Exam***  Labs reviewed: Recent Labs    10/01/22 1234 03/04/23 1126 07/15/23 1114 08/23/23 1325 08/24/23 0430 08/25/23 0527 08/26/23 0807 08/28/23 0937 08/29/23 0505 09/01/23 0535  NA 141   < > 144   < > 139 137 136  --  139 138  K 4.2   < > 4.8   < > 3.6 3.9 4.3 4.2 4.9 5.2*  CL 97   < > 101   < > 94* 96* 98  --  98 98  CO2 35*   < > 34*   < > 31 31 30   --  34* 33*  GLUCOSE 90   < > 90   < > 162* 179* 172*  --  119* 117*  BUN 21   < > 26*   < > 26* 40* 49*  --  34* 29*  CREATININE 1.04   < > 1.01   < > 1.00 1.10 1.12  --  0.88 0.77  CALCIUM 9.2   < > 9.2   < > 8.4* 8.6* 8.8*  --  8.4* 8.3*  MG  --    < >  --   --  2.5* 2.4 2.3  --   --   --   PHOS 3.3  --  3.6  --   --   --   --   --   --   --    < > = values in this interval not displayed.   Recent Labs    09/22/22 1125 10/01/22 1234 07/15/23 1114 08/23/23 1325  AST 20  --   --  25  ALT 15  --   --  18  ALKPHOS 49  --   --  61  BILITOT 0.5  --   --  0.4  PROT 6.0*  --   --  6.9  ALBUMIN 3.2* 3.5 3.8 3.6   Recent Labs    05/31/23 1530 06/01/23 0730 08/23/23 1325 08/24/23 0430 09/01/23 0535  WBC 13.3*   < > 13.5* 5.6 13.4*  NEUTROABS 11.9*  --  12.2*  --  11.3*  HGB 12.3*   < > 13.6 11.8* 12.0*  HCT 36.6*   < > 43.0 36.0* 36.0*  MCV 92.4   < > 96.6 93.0 91.4  PLT 136*   < > 304 249 282   < > = values in this interval not  displayed.   Lab Results  Component Value Date   TSH 0.700 08/12/2022   Lab Results  Component Value Date   HGBA1C 4.8 07/15/2023   Lab Results  Component Value Date   CHOL 141 08/13/2022   HDL 80 08/13/2022   LDLCALC 52 08/13/2022   TRIG 46 08/13/2022   CHOLHDL 1.8 08/13/2022    Significant Diagnostic Results in last 30 days:  ECHOCARDIOGRAM COMPLETE Result Date: 08/26/2023    ECHOCARDIOGRAM REPORT   Patient Name:   [name] [name]. Date of Exam: 08/25/2023 Medical Rec #:  [id number]            Height:       70.5 in Accession #:    [id number]           Weight:       155.0 lb Date of Birth:  1941/06/14            BSA:          1.883 m Patient Age:    82 years             BP:           139/77 mmHg Patient Gender: M                    HR:           90 bpm. Exam Location:  [facility] Procedure: 2D Echo, Cardiac Doppler and Color Doppler Indications:     Acytre respiratory distress R06.03  History:         Patient has prior history of Echocardiogram examinations, most                  recent 08/07/2022. CAD, COPD, Arrythmias:Atrial Fibrillation and                  Atrial Flutter; Risk Factors:Hypertension and Dyslipidemia.  Sonographer:     Lucendia Herrlich RCS Referring Phys:  [id number] [name] Diagnosing Phys: Chilton Si MD  Sonographer Comments: Image acquisition challenging due to respiratory motion. IMPRESSIONS  1. Left ventricular ejection fraction, by estimation, is 60 to 65%. The left ventricle has normal function. The left ventricle has no regional wall motion abnormalities. Left ventricular diastolic parameters are indeterminate.  2. Right ventricular systolic function is normal. The right ventricular size is normal. There is mildly elevated pulmonary artery systolic pressure.  3. The mitral valve is normal in structure. No evidence of mitral valve regurgitation. No evidence of mitral stenosis.  4. The aortic valve is tricuspid. Aortic valve regurgitation is not visualized. No  aortic stenosis is present.  5. The inferior vena cava is normal in size with greater than  50% respiratory variability, suggesting right atrial pressure of 3 mmHg. FINDINGS  Left Ventricle: Left ventricular ejection fraction, by estimation, is 60 to 65%. The left ventricle has normal function. The left ventricle has no regional wall motion abnormalities. The left ventricular internal cavity size was normal in size. There is  no left ventricular hypertrophy. Left ventricular diastolic parameters are indeterminate. Indeterminate filling pressures. Right Ventricle: The right ventricular size is normal. No increase in right ventricular wall thickness. Right ventricular systolic function is normal. There is mildly elevated pulmonary artery systolic pressure. The tricuspid regurgitant velocity is 2.97  m/s, and with an assumed right atrial pressure of 3 mmHg, the estimated right ventricular systolic pressure is 38.3 mmHg. Left Atrium: Left atrial size was normal in size. Right Atrium: Right atrial size was normal in size. Pericardium: There is no evidence of pericardial effusion. Mitral Valve: The mitral valve is normal in structure. No evidence of mitral valve regurgitation. No evidence of mitral valve stenosis. Tricuspid Valve: The tricuspid valve is normal in structure. Tricuspid valve regurgitation is trivial. No evidence of tricuspid stenosis. Aortic Valve: The aortic valve is tricuspid. Aortic valve regurgitation is not visualized. No aortic stenosis is present. Aortic valve peak gradient measures 5.1 mmHg. Pulmonic Valve: The pulmonic valve was normal in structure. Pulmonic valve regurgitation is not visualized. No evidence of pulmonic stenosis. Aorta: The aortic root is normal in size and structure. Venous: The inferior vena cava is normal in size with greater than 50% respiratory variability, suggesting right atrial pressure of 3 mmHg. IAS/Shunts: No atrial level shunt detected by color flow Doppler.  LEFT  VENTRICLE PLAX 2D LVIDd:         4.20 cm   Diastology LVIDs:         2.60 cm   LV e' medial:    9.14 cm/s LV PW:         1.10 cm   LV E/e' medial:  10.9 LV IVS:        0.80 cm   LV e' lateral:   8.27 cm/s LVOT diam:     2.00 cm   LV E/e' lateral: 12.1 LV SV:         53 LV SV Index:   28 LVOT Area:     3.14 cm  RIGHT VENTRICLE            IVC RV S prime:     9.79 cm/s  IVC diam: 1.40 cm TAPSE (M-mode): 2.5 cm LEFT ATRIUM         Index LA diam:    3.10 cm 1.65 cm/m  AORTIC VALVE AV Area (Vmax): 2.10 cm AV Vmax:        113.00 cm/s AV Peak Grad:   5.1 mmHg LVOT Vmax:      75.63 cm/s LVOT Vmean:     49.867 cm/s LVOT VTI:       0.168 m  AORTA Ao Root diam: 3.20 cm Ao Asc diam:  3.10 cm MITRAL VALVE                TRICUSPID VALVE MV Area (PHT): 3.77 cm     TR Peak grad:   35.3 mmHg MV Decel Time: 201 msec     TR Vmax:        297.00 cm/s MV E velocity: 100.00 cm/s MV A velocity: 99.00 cm/s   SHUNTS MV E/A ratio:  1.01         Systemic VTI:  0.17 m  Systemic Diam: 2.00 cm Chilton Si MD Electronically signed by Chilton Si MD Signature Date/Time: 08/26/2023/7:51:48 AM    Final    DG Chest Portable 1 View Result Date: 08/23/2023 CLINICAL DATA:  Shortness of breath EXAM: PORTABLE CHEST 1 VIEW COMPARISON:  Chest radiograph dated 05/31/2023 FINDINGS: Hyperinflated lungs. Asymmetric lucency of the left upper lung. No focal consolidations. No pleural effusion or pneumothorax. The heart size and mediastinal contours are within normal limits. No acute osseous abnormality. IMPRESSION: 1. Hyperinflated lungs with asymmetric lucency of the left upper lung, in keeping with emphysematous changes. 2. No focal consolidations. Electronically Signed   By: Agustin Cree M.D.   On: 08/23/2023 14:30    Assessment/Plan No problem-specific Assessment & Plan notes found for this encounter.    Janene Harvey. Biagio Borg [facility] [phone]

## 2023-09-10 NOTE — Progress Notes (Signed)
Location:  Other [place].  Nursing Home Room Number: [facility] 109A Place of Service:  SNF [phone]) Abbey Chatters, NP  PCP: [name], MD  Patient Care Team: [name], MD as PCP - General (Internal Medicine) Iran Ouch, MD as PCP - Cardiology (Cardiology) Lanier Prude, MD as PCP - Electrophysiology (Cardiology) Otho Ket, RN as [facility] Management  Extended Emergency Contact Information Primary Emergency Contact: Cillo,Susan Address: [address]          [place], [place] [address] [place] of [place] Home Phone: [phone] Mobile Phone: [phone] Relation: Spouse  Goals of care: Advanced Directive information    09/10/2023   11:20 AM  Advanced Directives  Does Patient Have a Medical Advance Directive? Yes  Type of Advance Directive Out of facility DNR (pink MOST or yellow form)  Does patient want to make changes to medical advance directive? No - Patient declined     Chief Complaint  Patient presents with   Acute Visit    Toenails.     HPI:  Pt is a 83 y.o. male seen today for an acute visit for Toenails. Patient requested his toenails to be assessed and clipped if needed.   Past Medical History:  Diagnosis Date   Abdominal discomfort 12/07/2020   Acquired trigger finger of right middle finger 02/04/2020   Acute prostatitis 04/08/2018   Alcohol abuse    Alcohol dependence (HCC) 12/07/2020   Anal or rectal pain 04/08/2018   Annual physical exam 12/07/2020   Anorectal disorder 12/07/2020   Arthritis    Atherosclerotic heart disease of native coronary artery without angina pectoris 04/08/2018   a. 03/2018 PCI: LM nl, LAD 63m, LCX nl, OM1 70p (2.75x8 Synergy DES), 26m (2.5x24 Synergy DES), RCA 25p, RPL1 50. EF 55-65%.   Atrial flutter (HCC)    a. 07/2022 in setting of hospitalization for resp illness/RSV. CHA2DS2VASc = 4-->eliquis; b. 08/2022 s/p DCCV (100J); c. 09/26/2022 Recurrent Aflutter.    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 02/2022 Carotid U/S: 1-39% bilat ICA stenoses.   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   Contracture of palmar fascia 12/07/2020   COPD (chronic obstructive pulmonary disease) (HCC)    Corn of toe 12/07/2020   Corns and callosities 04/09/2018   Coronary artery disease    2019 with stents   Coronary atherosclerosis 05/07/2018   Degenerative disc disease, 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   Degenerative disc disease, 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   Diarrhea 04/09/2018   Double vision with both eyes open 12/07/2020   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   Dysphagia 08/14/2018   Dyspnea    ED (erectile dysfunction)    ED (erectile dysfunction) of organic origin 12/07/2020   Elevated prostate specific antigen (PSA) 04/08/2018   Elevated PSA    Encounter for screening for other disorder 12/07/2020   Enlarged prostate without lower urinary tract symptoms (luts) 04/09/2018   Enthesopathy 12/07/2020   Essential (primary) hypertension 04/08/2018   Ex-smoker 04/09/2018   Exertional dyspnea 04/02/2018   Extrapyramidal and movement disorder  04/09/2018   Flatulence 04/08/2018   Gastro-esophageal reflux disease without esophagitis 04/08/2018   GERD (gastroesophageal reflux disease)    History of acute otitis externa 08/14/2018   History of echocardiogram    a. 07/2022 Echo: EF 65-70%, no rwma, mild LVH, nl RV fxn.   Hyperlipidemia    Hypertension    Hypoxemia 12/07/2020   Kidney cysts    Left knee pain 12/07/2020    Low back pain 04/08/2018   Male erectile dysfunction 04/09/2018   Mixed hyperlipidemia 04/08/2018   Nocturia more than twice per night 04/09/2018   Osteoarthritis of first carpometacarpal joint 04/08/2018   Osteoarthritis of knee 12/07/2020   Other long term (current) drug therapy 12/07/2020   Pain due to onychomycosis of toenail of left foot 07/20/2019   Pain in joint 04/09/2018   Pain in knee 04/09/2018   Pain in unspecified knee 12/07/2020   Pain of finger 04/09/2018   Palpitations    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HCC) 12/07/2020   Personal history of colonic polyps 12/07/2020   Pneumonia    Pneumothorax 04/12/2020   Polypharmacy 04/09/2018   Porokeratosis 01/13/2019   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HCC)    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   Pulmonary nodules 06/08/2016   Pure hypercholesterolemia 12/07/2020   Renal cyst 12/07/2020   Sleep disorder 04/08/2018   Status post bronchoscopy 04/12/2020   Tear of rotator cuff 04/09/2018   Tobacco user 12/07/2020   Uncomplicated alcohol dependence (HCC) 04/09/2018   Unspecified rotator cuff tear or rupture of unspecified shoulder, not specified as traumatic 12/07/2020   Unspecified tear of unspecified meniscus, current injury, unspecified knee, initial encounter 12/07/2020   Visual disturbance 12/07/2020   Vitamin D deficiency 04/08/2018   Past Surgical History:  Procedure Laterality Date   BRONCHIAL BIOPSY  10/13/2019   Procedure: BRONCHIAL BIOPSIES;  Surgeon: Leslye Peer, MD;  Location: [facility] ENDOSCOPY;  Service: Pulmonary;;   BRONCHIAL BIOPSY  04/12/2020   Procedure: BRONCHIAL BIOPSIES;  Surgeon: Leslye Peer, MD;  Location: [facility] ENDOSCOPY;  Service: Pulmonary;;   BRONCHIAL BRUSHINGS  10/13/2019   Procedure: BRONCHIAL BRUSHINGS;  Surgeon: Leslye Peer, MD;  Location: [facility] ENDOSCOPY;  Service: Pulmonary;;   BRONCHIAL BRUSHINGS  04/12/2020   Procedure: BRONCHIAL BRUSHINGS;  Surgeon: Leslye Peer, MD;  Location: [facility] ENDOSCOPY;   Service: Pulmonary;;   BRONCHIAL NEEDLE ASPIRATION BIOPSY  10/13/2019   Procedure: BRONCHIAL NEEDLE ASPIRATION BIOPSIES;  Surgeon: Leslye Peer, MD;  Location: [facility] ENDOSCOPY;  Service: Pulmonary;;   BRONCHIAL NEEDLE ASPIRATION BIOPSY  04/12/2020   Procedure: BRONCHIAL NEEDLE ASPIRATION BIOPSIES;  Surgeon: Leslye Peer, MD;  Location: [facility] ENDOSCOPY;  Service: Pulmonary;;   BRONCHIAL WASHINGS  10/13/2019   Procedure: BRONCHIAL WASHINGS;  Surgeon: Leslye Peer, MD;  Location: [facility] ENDOSCOPY;  Service: Pulmonary;;   BRONCHIAL WASHINGS  04/12/2020   Procedure: BRONCHIAL WASHINGS;  Surgeon: Leslye Peer, MD;  Location: [facility] ENDOSCOPY;  Service: Pulmonary;;   CARDIAC CATHETERIZATION  2002   50% RCA   CARDIOVERSION N/A 09/17/2022   Procedure: CARDIOVERSION;  Surgeon: Iran Ouch, MD;  Location: [facility] ORS;  Service: Cardiovascular;  Laterality: N/A;   CATARACT EXTRACTION, BILATERAL     CORONARY STENT INTERVENTION N/A 04/15/2018   Procedure: CORONARY STENT INTERVENTION;  Surgeon: Corky Crafts, MD;  Location: [facility] INVASIVE CV LAB;  Service: Cardiovascular;  Laterality: N/A;  om1   EYE SURGERY     KNEE ARTHROSCOPY     LEFT HEART CATH AND CORONARY ANGIOGRAPHY N/A 04/15/2018  Procedure: LEFT HEART CATH AND CORONARY ANGIOGRAPHY;  Surgeon: Corky Crafts, MD;  Location: [facility] INVASIVE CV LAB;  Service: Cardiovascular;  Laterality: N/A;   MULTIPLE TOOTH EXTRACTIONS     TONSILLECTOMY     VIDEO BRONCHOSCOPY  04/12/2020   VIDEO BRONCHOSCOPY WITH ENDOBRONCHIAL NAVIGATION N/A 07/26/2016   Procedure: VIDEO BRONCHOSCOPY WITH ENDOBRONCHIAL NAVIGATION;  Surgeon: Leslye Peer, MD;  Location: [facility] OR;  Service: Thoracic;  Laterality: N/A;   VIDEO BRONCHOSCOPY WITH ENDOBRONCHIAL NAVIGATION N/A 10/13/2019   Procedure: [facility] AND VIDEO BRONCHOSCOPY WITH ENDOBRONCHIAL NAVIGATION;  Surgeon: Leslye Peer, MD;  Location: [facility] ENDOSCOPY;  Service: Pulmonary;  Laterality: N/A;   VIDEO BRONCHOSCOPY WITH  ENDOBRONCHIAL NAVIGATION N/A 04/12/2020   Procedure: VIDEO BRONCHOSCOPY WITH ENDOBRONCHIAL NAVIGATION;  Surgeon: Leslye Peer, MD;  Location: [facility] ENDOSCOPY;  Service: Pulmonary;  Laterality: N/A;    Allergies  Allergen Reactions   Flagyl [Metronidazole] Other (See Comments)    Other reaction(s): skin reaction    Outpatient Encounter Medications as of 09/10/2023  Medication Sig   acetaminophen (TYLENOL) 325 MG tablet Take 650 mg by mouth every 4 (four) hours as needed.   albuterol (VENTOLIN HFA) 108 (90 Base) MCG/ACT inhaler INHALE 2 PUFFS 2-3 TIMES A DAY AS NEEDED FOR WHEEZING (30 DAYS) 30 DAY(S)   apixaban (ELIQUIS) 5 MG TABS tablet Take 1 tablet (5 mg total) by mouth 2 (two) times daily.   Budeson-Glycopyrrol-Formoterol (BREZTRI AEROSPHERE) 160-9-4.8 MCG/ACT AERO Inhale 2 puffs into the lungs 2 (two) times daily.   clonazePAM (KLONOPIN) 0.5 MG tablet Take 1 tablet (0.5 mg total) by mouth at bedtime.   diltiazem (CARDIZEM CD) 120 MG 24 hr capsule TAKE 1 CAPSULE BY MOUTH 2 TIMES DAILY.   dofetilide (TIKOSYN) 250 MCG capsule Take 1 capsule (250 mcg total) by mouth 2 (two) times daily.   finasteride (PROSCAR) 5 MG tablet Take 5 mg by mouth daily.   furosemide (LASIX) 20 MG tablet Give Alternating Dose of 20mg /40mg  by mouth once daily.   gabapentin (NEURONTIN) 100 MG capsule Take 200 mg by mouth at bedtime.   ipratropium-albuterol (DUONEB) 0.5-2.5 (3) MG/3ML SOLN INHALE 3 ML BY NEBULIZER EVERY 6 HOURS AS NEEDED   morphine (ROXANOL) 20 MG/ML concentrated solution Take 0.25 mLs (5 mg total) by mouth daily as needed.   nitroGLYCERIN (NITROSTAT) 0.4 MG SL tablet Place 1 tablet (0.4 mg total) under the tongue every 5 (five) minutes as needed for chest pain.   OXYGEN Inhale into the lungs. 3-4 liters upon exertion and 3 liters continuous at night.   pantoprazole (PROTONIX) 40 MG tablet Take 1 tablet (40 mg total) by mouth daily.   polyethylene glycol (MIRALAX / GLYCOLAX) 17 g packet Take 17 g by  mouth daily as needed for moderate constipation.   predniSONE (DELTASONE) 10 MG tablet Take 4 tablets (40 mg total) by mouth daily for 3 days, THEN 2 tablets (20 mg total) daily for 3 days, THEN 1 tablet (10 mg total) daily for 3 days.   rosuvastatin (CRESTOR) 20 MG tablet Take 1 tablet (20 mg total) by mouth daily.   senna (SENOKOT) 8.6 MG TABS tablet Take 2 tablets by mouth at bedtime.   silver sulfADIAZINE (SILVADENE) 1 % cream Apply 1 Application topically daily.   sodium chloride HYPERTONIC 3 % nebulizer solution Take by nebulization in the morning and at bedtime.   [DISCONTINUED] Docusate Calcium (STOOL SOFTENER PO) Take 1 tablet by mouth every evening.   [DISCONTINUED] furosemide (LASIX) 20 MG tablet Take 2  tablets (40 mg total) by mouth as directed. Take 2 tablets in the morning (40 mg) for 4 days.   No facility-administered encounter medications on file as of 09/10/2023.    Review of Systems  Skin:        Toenails thick and feel sharp     Immunization History  Administered Date(s) Administered   Fluad Quad(high Dose 65+) 04/13/2020, 03/29/2021, 05/18/2022   Influenza Split 04/19/2009, 04/26/2010, 05/14/2011, 07/19/2011, 05/06/2015, 02/28/2019, 04/13/2020   Influenza, High Dose Seasonal PF 04/02/2018, 03/11/2019, 04/13/2020, 06/17/2023   Influenza,inj,Quad PF,6+ Mos 03/30/2016   PFIZER(Purple Top)SARS-COV-2 Vaccination 08/13/2019, 09/01/2019, 05/24/2020, 09/13/2020   Pfizer Covid-19 Vaccine Bivalent Booster 79yrs & up 04/20/2021, 05/27/2021   Pfizer(Comirnaty)Fall Seasonal Vaccine 12 years and older 08/04/2023   Pneumococcal Conjugate-13 04/29/2014   Pneumococcal Polysaccharide-23 09/12/2005, 11/27/2011, 05/01/2013   Zoster, Live 04/13/2008   Pertinent  Health Maintenance Due  Topic Date Due   INFLUENZA VACCINE  Completed      04/13/2020   12:00 PM 09/12/2020   11:17 AM 08/12/2022    1:02 PM 09/18/2022    9:26 AM 07/15/2023   10:28 AM  Fall Risk  Falls in the past year?   0  0 0  Was there an injury with Fall?    0 0  Fall Risk Category Calculator    0 0  (RETIRED) Patient Fall Risk Level Moderate fall risk Moderate fall risk Low fall risk    Patient at Risk for Falls Due to  Medication side effect  No Fall Risks Impaired balance/gait;Impaired mobility  Fall risk Follow up  Falls prevention discussed;Education provided  Falls evaluation completed Falls evaluation completed   Functional Status Survey:    Vitals:   09/10/23 1112 09/10/23 1122  BP: (!) 154/88 (!) 112/52  Pulse: 82   Resp: 16   Temp: (!) 97.5 F (36.4 C)   SpO2: 97%   Weight: 147 lb 3.2 oz (66.8 kg)   Height: 5\' 10"  (1.778 m)    Body mass index is 21.12 kg/m. Physical Exam Cardiovascular:     Pulses:          Dorsalis pedis pulses are 1+ on the right side and 1+ on the left side.  Musculoskeletal:     Right lower leg: Edema present.     Left lower leg: Edema present.  Feet:     Right foot:     Skin integrity: Warmth, callus and dry skin present.     Toenail Condition: Fungal disease present.    Left foot:     Skin integrity: Ulcer, warmth, callus and dry skin present.     Toenail Condition: Fungal disease present.    Labs reviewed: Recent Labs    10/01/22 1234 03/04/23 1126 07/15/23 1114 08/23/23 1325 08/24/23 0430 08/25/23 0527 08/26/23 0807 08/28/23 0937 08/29/23 0505 09/01/23 0535  NA 141   < > 144   < > 139 137 136  --  139 138  K 4.2   < > 4.8   < > 3.6 3.9 4.3 4.2 4.9 5.2*  CL 97   < > 101   < > 94* 96* 98  --  98 98  CO2 35*   < > 34*   < > 31 31 30   --  34* 33*  GLUCOSE 90   < > 90   < > 162* 179* 172*  --  119* 117*  BUN 21   < > 26*   < > 26* 40* 49*  --  34* 29*  CREATININE 1.04   < > 1.01   < > 1.00 1.10 1.12  --  0.88 0.77  CALCIUM 9.2   < > 9.2   < > 8.4* 8.6* 8.8*  --  8.4* 8.3*  MG  --    < >  --   --  2.5* 2.4 2.3  --   --   --   PHOS 3.3  --  3.6  --   --   --   --   --   --   --    < > = values in this interval not displayed.   Recent  Labs    09/22/22 1125 10/01/22 1234 07/15/23 1114 08/23/23 1325  AST 20  --   --  25  ALT 15  --   --  18  ALKPHOS 49  --   --  61  BILITOT 0.5  --   --  0.4  PROT 6.0*  --   --  6.9  ALBUMIN 3.2* 3.5 3.8 3.6   Recent Labs    05/31/23 1530 06/01/23 0730 08/23/23 1325 08/24/23 0430 09/01/23 0535  WBC 13.3*   < > 13.5* 5.6 13.4*  NEUTROABS 11.9*  --  12.2*  --  11.3*  HGB 12.3*   < > 13.6 11.8* 12.0*  HCT 36.6*   < > 43.0 36.0* 36.0*  MCV 92.4   < > 96.6 93.0 91.4  PLT 136*   < > 304 249 282   < > = values in this interval not displayed.   Lab Results  Component Value Date   TSH 0.700 08/12/2022   Lab Results  Component Value Date   HGBA1C 4.8 07/15/2023   Lab Results  Component Value Date   CHOL 141 08/13/2022   HDL 80 08/13/2022   LDLCALC 52 08/13/2022   TRIG 46 08/13/2022   CHOLHDL 1.8 08/13/2022    Significant Diagnostic Results in last 30 days:  ECHOCARDIOGRAM COMPLETE Result Date: 08/26/2023    ECHOCARDIOGRAM REPORT   Patient Name:   [name] [name]. Date of Exam: 08/25/2023 Medical Rec #:  [id number]            Height:       70.5 in Accession #:    [id number]           Weight:       155.0 lb Date of Birth:  Oct 22, 1940            BSA:          1.883 m Patient Age:    82 years             BP:           139/77 mmHg Patient Gender: M                    HR:           90 bpm. Exam Location:  [facility] Procedure: 2D Echo, Cardiac Doppler and Color Doppler Indications:     Acytre respiratory distress R06.03  History:         Patient has prior history of Echocardiogram examinations, most                  recent 08/07/2022. CAD, COPD, Arrythmias:Atrial Fibrillation and                  Atrial Flutter; Risk Factors:Hypertension and Dyslipidemia.  Sonographer:  Lucendia Herrlich RCS Referring Phys:  [id number] [name] Diagnosing Phys: Chilton Si MD  Sonographer Comments: Image acquisition challenging due to respiratory motion. IMPRESSIONS  1. Left ventricular  ejection fraction, by estimation, is 60 to 65%. The left ventricle has normal function. The left ventricle has no regional wall motion abnormalities. Left ventricular diastolic parameters are indeterminate.  2. Right ventricular systolic function is normal. The right ventricular size is normal. There is mildly elevated pulmonary artery systolic pressure.  3. The mitral valve is normal in structure. No evidence of mitral valve regurgitation. No evidence of mitral stenosis.  4. The aortic valve is tricuspid. Aortic valve regurgitation is not visualized. No aortic stenosis is present.  5. The inferior vena cava is normal in size with greater than 50% respiratory variability, suggesting right atrial pressure of 3 mmHg. FINDINGS  Left Ventricle: Left ventricular ejection fraction, by estimation, is 60 to 65%. The left ventricle has normal function. The left ventricle has no regional wall motion abnormalities. The left ventricular internal cavity size was normal in size. There is  no left ventricular hypertrophy. Left ventricular diastolic parameters are indeterminate. Indeterminate filling pressures. Right Ventricle: The right ventricular size is normal. No increase in right ventricular wall thickness. Right ventricular systolic function is normal. There is mildly elevated pulmonary artery systolic pressure. The tricuspid regurgitant velocity is 2.97  m/s, and with an assumed right atrial pressure of 3 mmHg, the estimated right ventricular systolic pressure is 38.3 mmHg. Left Atrium: Left atrial size was normal in size. Right Atrium: Right atrial size was normal in size. Pericardium: There is no evidence of pericardial effusion. Mitral Valve: The mitral valve is normal in structure. No evidence of mitral valve regurgitation. No evidence of mitral valve stenosis. Tricuspid Valve: The tricuspid valve is normal in structure. Tricuspid valve regurgitation is trivial. No evidence of tricuspid stenosis. Aortic Valve: The  aortic valve is tricuspid. Aortic valve regurgitation is not visualized. No aortic stenosis is present. Aortic valve peak gradient measures 5.1 mmHg. Pulmonic Valve: The pulmonic valve was normal in structure. Pulmonic valve regurgitation is not visualized. No evidence of pulmonic stenosis. Aorta: The aortic root is normal in size and structure. Venous: The inferior vena cava is normal in size with greater than 50% respiratory variability, suggesting right atrial pressure of 3 mmHg. IAS/Shunts: No atrial level shunt detected by color flow Doppler.  LEFT VENTRICLE PLAX 2D LVIDd:         4.20 cm   Diastology LVIDs:         2.60 cm   LV e' medial:    9.14 cm/s LV PW:         1.10 cm   LV E/e' medial:  10.9 LV IVS:        0.80 cm   LV e' lateral:   8.27 cm/s LVOT diam:     2.00 cm   LV E/e' lateral: 12.1 LV SV:         53 LV SV Index:   28 LVOT Area:     3.14 cm  RIGHT VENTRICLE            IVC RV S prime:     9.79 cm/s  IVC diam: 1.40 cm TAPSE (M-mode): 2.5 cm LEFT ATRIUM         Index LA diam:    3.10 cm 1.65 cm/m  AORTIC VALVE AV Area (Vmax): 2.10 cm AV Vmax:        113.00 cm/s AV Peak Grad:  5.1 mmHg LVOT Vmax:      75.63 cm/s LVOT Vmean:     49.867 cm/s LVOT VTI:       0.168 m  AORTA Ao Root diam: 3.20 cm Ao Asc diam:  3.10 cm MITRAL VALVE                TRICUSPID VALVE MV Area (PHT): 3.77 cm     TR Peak grad:   35.3 mmHg MV Decel Time: 201 msec     TR Vmax:        297.00 cm/s MV E velocity: 100.00 cm/s MV A velocity: 99.00 cm/s   SHUNTS MV E/A ratio:  1.01         Systemic VTI:  0.17 m                             Systemic Diam: 2.00 cm Chilton Si MD Electronically signed by Chilton Si MD Signature Date/Time: 08/26/2023/7:51:48 AM    Final    DG Chest Portable 1 View Result Date: 08/23/2023 CLINICAL DATA:  Shortness of breath EXAM: PORTABLE CHEST 1 VIEW COMPARISON:  Chest radiograph dated 05/31/2023 FINDINGS: Hyperinflated lungs. Asymmetric lucency of the left upper lung. No focal consolidations.  No pleural effusion or pneumothorax. The heart size and mediastinal contours are within normal limits. No acute osseous abnormality. IMPRESSION: 1. Hyperinflated lungs with asymmetric lucency of the left upper lung, in keeping with emphysematous changes. 2. No focal consolidations. Electronically Signed   By: Agustin Cree M.D.   On: 08/23/2023 14:30    Assessment/Plan  1. Mycotic toenails (Primary) - Patient requested a evaluation Upon examination, toe nails normal in length, not noted to be overgrown. - Per patient's request, toenail edges filed and smoothed due feeling sharp - Lotion applied to toes. Nursing to file if needed and can clip as able.  - Patient now on list to be seen and followed by Podiatry for routine foot care and maintenance.  Lenord Fellers, DNP AGPCNP Student I personally was present during the history, physical exam and medical decision-making activities of this service and have verified that the service and findings are accurately documented in the student's note Earlene Bjelland K. Biagio Borg [facility] [phone]

## 2023-09-11 ENCOUNTER — Other Ambulatory Visit: Payer: Self-pay | Admitting: *Deleted

## 2023-09-11 DIAGNOSIS — I4811 Longstanding persistent atrial fibrillation: Secondary | ICD-10-CM | POA: Diagnosis not present

## 2023-09-11 DIAGNOSIS — Z9981 Dependence on supplemental oxygen: Secondary | ICD-10-CM | POA: Diagnosis not present

## 2023-09-11 DIAGNOSIS — R2681 Unsteadiness on feet: Secondary | ICD-10-CM | POA: Diagnosis not present

## 2023-09-11 DIAGNOSIS — Z741 Need for assistance with personal care: Secondary | ICD-10-CM | POA: Diagnosis not present

## 2023-09-11 DIAGNOSIS — I2510 Atherosclerotic heart disease of native coronary artery without angina pectoris: Secondary | ICD-10-CM | POA: Diagnosis not present

## 2023-09-11 DIAGNOSIS — J9621 Acute and chronic respiratory failure with hypoxia: Secondary | ICD-10-CM | POA: Diagnosis not present

## 2023-09-11 DIAGNOSIS — J431 Panlobular emphysema: Secondary | ICD-10-CM | POA: Diagnosis not present

## 2023-09-11 DIAGNOSIS — J441 Chronic obstructive pulmonary disease with (acute) exacerbation: Secondary | ICD-10-CM | POA: Diagnosis not present

## 2023-09-11 DIAGNOSIS — I4891 Unspecified atrial fibrillation: Secondary | ICD-10-CM | POA: Diagnosis not present

## 2023-09-11 DIAGNOSIS — R2689 Other abnormalities of gait and mobility: Secondary | ICD-10-CM | POA: Diagnosis not present

## 2023-09-11 DIAGNOSIS — R278 Other lack of coordination: Secondary | ICD-10-CM | POA: Diagnosis not present

## 2023-09-11 DIAGNOSIS — M6281 Muscle weakness (generalized): Secondary | ICD-10-CM | POA: Diagnosis not present

## 2023-09-11 NOTE — Patient Outreach (Signed)
Post- Acute Care Manager follow up. Mr. [name] [name] is active with [facility] RNCM as benefit of health plan and PCP.   Voicemail left for [name], [facility] Admissions Director inquire about projected dc date/plans.   Will keep [facility] RNCM updated.   Raiford Noble, MSN, RN, BSN [place]  [facility], [facility] Commun[facility] RN Post- Acute Care Manager Direct Dial: [phone]

## 2023-09-12 ENCOUNTER — Other Ambulatory Visit: Payer: Self-pay | Admitting: *Deleted

## 2023-09-12 DIAGNOSIS — Z741 Need for assistance with personal care: Secondary | ICD-10-CM | POA: Diagnosis not present

## 2023-09-12 DIAGNOSIS — J431 Panlobular emphysema: Secondary | ICD-10-CM | POA: Diagnosis not present

## 2023-09-12 DIAGNOSIS — I4891 Unspecified atrial fibrillation: Secondary | ICD-10-CM | POA: Diagnosis not present

## 2023-09-12 DIAGNOSIS — J9621 Acute and chronic respiratory failure with hypoxia: Secondary | ICD-10-CM | POA: Diagnosis not present

## 2023-09-12 DIAGNOSIS — Z9981 Dependence on supplemental oxygen: Secondary | ICD-10-CM | POA: Diagnosis not present

## 2023-09-12 DIAGNOSIS — R278 Other lack of coordination: Secondary | ICD-10-CM | POA: Diagnosis not present

## 2023-09-12 DIAGNOSIS — J441 Chronic obstructive pulmonary disease with (acute) exacerbation: Secondary | ICD-10-CM | POA: Diagnosis not present

## 2023-09-12 DIAGNOSIS — I4811 Longstanding persistent atrial fibrillation: Secondary | ICD-10-CM | POA: Diagnosis not present

## 2023-09-12 DIAGNOSIS — M6281 Muscle weakness (generalized): Secondary | ICD-10-CM | POA: Diagnosis not present

## 2023-09-12 DIAGNOSIS — I2510 Atherosclerotic heart disease of native coronary artery without angina pectoris: Secondary | ICD-10-CM | POA: Diagnosis not present

## 2023-09-12 DIAGNOSIS — R2689 Other abnormalities of gait and mobility: Secondary | ICD-10-CM | POA: Diagnosis not present

## 2023-09-12 DIAGNOSIS — R2681 Unsteadiness on feet: Secondary | ICD-10-CM | POA: Diagnosis not present

## 2023-09-12 NOTE — Patient Outreach (Signed)
Post-Acute Care Manager follow up. Mr. Scheib resides in [place] ([place]. He was active with [facility] RN CM prior to admission as benefit of health plan and Primary Care Provider.   Update received from Jethro Poling Admissions Director. Insurance issued [facility] for last covered day of 09/13/23. Mr. Dudek is appealing decision and they are awaiting determination. If he does not win appeal, Mr. Kon will return to ILF with outpatient therapy over the weekend. He will have access to the nurse clinic as needed.   Will follow for transition date. Will update [facility] RN CM.   Raiford Noble, MSN, RN, BSN [place]  [facility], [facility] Communities RN Post- Acute Care Manager Direct Dial: [phone]

## 2023-09-13 ENCOUNTER — Other Ambulatory Visit: Payer: Self-pay | Admitting: *Deleted

## 2023-09-13 DIAGNOSIS — I4811 Longstanding persistent atrial fibrillation: Secondary | ICD-10-CM | POA: Diagnosis not present

## 2023-09-13 DIAGNOSIS — I4891 Unspecified atrial fibrillation: Secondary | ICD-10-CM | POA: Diagnosis not present

## 2023-09-13 DIAGNOSIS — J9621 Acute and chronic respiratory failure with hypoxia: Secondary | ICD-10-CM | POA: Diagnosis not present

## 2023-09-13 DIAGNOSIS — R278 Other lack of coordination: Secondary | ICD-10-CM | POA: Diagnosis not present

## 2023-09-13 DIAGNOSIS — Z9981 Dependence on supplemental oxygen: Secondary | ICD-10-CM | POA: Diagnosis not present

## 2023-09-13 DIAGNOSIS — R2689 Other abnormalities of gait and mobility: Secondary | ICD-10-CM | POA: Diagnosis not present

## 2023-09-13 DIAGNOSIS — Z741 Need for assistance with personal care: Secondary | ICD-10-CM | POA: Diagnosis not present

## 2023-09-13 DIAGNOSIS — R2681 Unsteadiness on feet: Secondary | ICD-10-CM | POA: Diagnosis not present

## 2023-09-13 DIAGNOSIS — I2510 Atherosclerotic heart disease of native coronary artery without angina pectoris: Secondary | ICD-10-CM | POA: Diagnosis not present

## 2023-09-13 DIAGNOSIS — J431 Panlobular emphysema: Secondary | ICD-10-CM | POA: Diagnosis not present

## 2023-09-13 DIAGNOSIS — M6281 Muscle weakness (generalized): Secondary | ICD-10-CM | POA: Diagnosis not present

## 2023-09-13 DIAGNOSIS — J441 Chronic obstructive pulmonary disease with (acute) exacerbation: Secondary | ICD-10-CM | POA: Diagnosis not present

## 2023-09-13 NOTE — Patient Outreach (Signed)
Post- Acute Care Manager follow up. [name] [name] resides in [place] ([place]. He is active with complex care management team as benefit of health plan and Primary Care Provider.   Update from [facility], Insurance claims handler. [name] [name] won his appeal. He will remain in SNF for a few more days.   Writer will follow up next week. Will keep [facility]BC[facility] RN CM updated.   [name] Noble, MSN, RN, BSN [name]  [facility], Healthy Communities RN Post- Acute Care Manager Direct Dial: [phone]

## 2023-09-16 ENCOUNTER — Other Ambulatory Visit: Payer: Self-pay | Admitting: *Deleted

## 2023-09-16 NOTE — Patient Outreach (Signed)
Post- Acute Care Management follow up. Mr. Royse resides in [place] ([place] He is active with [facility] RN CM.   Update received from Jethro Poling Admission Director. Mr. Ellenbecker will discharge from [facility] 09/16/23 today at 130 pm. Will have outpatient therapy services for PT/OT services. Will return to [facility] ILF.   Will update [facility] RN CM.   Raiford Noble, MSN, RN, BSN [place]  [facility], Healthy Communities RN Post- Acute Care Manager Direct Dial: [phone]

## 2023-09-17 DIAGNOSIS — R2689 Other abnormalities of gait and mobility: Secondary | ICD-10-CM | POA: Diagnosis not present

## 2023-09-17 DIAGNOSIS — R278 Other lack of coordination: Secondary | ICD-10-CM | POA: Diagnosis not present

## 2023-09-17 DIAGNOSIS — I4891 Unspecified atrial fibrillation: Secondary | ICD-10-CM | POA: Diagnosis not present

## 2023-09-17 DIAGNOSIS — Z741 Need for assistance with personal care: Secondary | ICD-10-CM | POA: Diagnosis not present

## 2023-09-17 DIAGNOSIS — I4811 Longstanding persistent atrial fibrillation: Secondary | ICD-10-CM | POA: Diagnosis not present

## 2023-09-17 DIAGNOSIS — I2510 Atherosclerotic heart disease of native coronary artery without angina pectoris: Secondary | ICD-10-CM | POA: Diagnosis not present

## 2023-09-17 DIAGNOSIS — J431 Panlobular emphysema: Secondary | ICD-10-CM | POA: Diagnosis not present

## 2023-09-17 DIAGNOSIS — Z9981 Dependence on supplemental oxygen: Secondary | ICD-10-CM | POA: Diagnosis not present

## 2023-09-17 DIAGNOSIS — R2681 Unsteadiness on feet: Secondary | ICD-10-CM | POA: Diagnosis not present

## 2023-09-17 DIAGNOSIS — J9621 Acute and chronic respiratory failure with hypoxia: Secondary | ICD-10-CM | POA: Diagnosis not present

## 2023-09-17 DIAGNOSIS — M6281 Muscle weakness (generalized): Secondary | ICD-10-CM | POA: Diagnosis not present

## 2023-09-17 DIAGNOSIS — J441 Chronic obstructive pulmonary disease with (acute) exacerbation: Secondary | ICD-10-CM | POA: Diagnosis not present

## 2023-09-18 ENCOUNTER — Inpatient Hospital Stay: Payer: Medicare Other | Admitting: Student in an Organized Health Care Education/Training Program

## 2023-09-19 ENCOUNTER — Ambulatory Visit: Payer: Medicare Other | Admitting: Internal Medicine

## 2023-09-20 DIAGNOSIS — J9621 Acute and chronic respiratory failure with hypoxia: Secondary | ICD-10-CM | POA: Diagnosis not present

## 2023-09-20 DIAGNOSIS — R278 Other lack of coordination: Secondary | ICD-10-CM | POA: Diagnosis not present

## 2023-09-20 DIAGNOSIS — I4891 Unspecified atrial fibrillation: Secondary | ICD-10-CM | POA: Diagnosis not present

## 2023-09-20 DIAGNOSIS — I2510 Atherosclerotic heart disease of native coronary artery without angina pectoris: Secondary | ICD-10-CM | POA: Diagnosis not present

## 2023-09-20 DIAGNOSIS — J431 Panlobular emphysema: Secondary | ICD-10-CM | POA: Diagnosis not present

## 2023-09-20 DIAGNOSIS — I4811 Longstanding persistent atrial fibrillation: Secondary | ICD-10-CM | POA: Diagnosis not present

## 2023-09-20 DIAGNOSIS — R2689 Other abnormalities of gait and mobility: Secondary | ICD-10-CM | POA: Diagnosis not present

## 2023-09-20 DIAGNOSIS — Z741 Need for assistance with personal care: Secondary | ICD-10-CM | POA: Diagnosis not present

## 2023-09-20 DIAGNOSIS — J441 Chronic obstructive pulmonary disease with (acute) exacerbation: Secondary | ICD-10-CM | POA: Diagnosis not present

## 2023-09-20 DIAGNOSIS — R2681 Unsteadiness on feet: Secondary | ICD-10-CM | POA: Diagnosis not present

## 2023-09-20 DIAGNOSIS — Z9981 Dependence on supplemental oxygen: Secondary | ICD-10-CM | POA: Diagnosis not present

## 2023-09-20 DIAGNOSIS — M6281 Muscle weakness (generalized): Secondary | ICD-10-CM | POA: Diagnosis not present

## 2023-09-23 DIAGNOSIS — R2689 Other abnormalities of gait and mobility: Secondary | ICD-10-CM | POA: Diagnosis not present

## 2023-09-23 DIAGNOSIS — J9621 Acute and chronic respiratory failure with hypoxia: Secondary | ICD-10-CM | POA: Diagnosis not present

## 2023-09-23 DIAGNOSIS — J431 Panlobular emphysema: Secondary | ICD-10-CM | POA: Diagnosis not present

## 2023-09-23 DIAGNOSIS — Z9981 Dependence on supplemental oxygen: Secondary | ICD-10-CM | POA: Diagnosis not present

## 2023-09-23 DIAGNOSIS — I4891 Unspecified atrial fibrillation: Secondary | ICD-10-CM | POA: Diagnosis not present

## 2023-09-23 DIAGNOSIS — M6281 Muscle weakness (generalized): Secondary | ICD-10-CM | POA: Diagnosis not present

## 2023-09-23 DIAGNOSIS — R2681 Unsteadiness on feet: Secondary | ICD-10-CM | POA: Diagnosis not present

## 2023-09-23 DIAGNOSIS — I2510 Atherosclerotic heart disease of native coronary artery without angina pectoris: Secondary | ICD-10-CM | POA: Diagnosis not present

## 2023-09-23 DIAGNOSIS — R278 Other lack of coordination: Secondary | ICD-10-CM | POA: Diagnosis not present

## 2023-09-23 DIAGNOSIS — I4811 Longstanding persistent atrial fibrillation: Secondary | ICD-10-CM | POA: Diagnosis not present

## 2023-09-23 DIAGNOSIS — Z741 Need for assistance with personal care: Secondary | ICD-10-CM | POA: Diagnosis not present

## 2023-09-23 DIAGNOSIS — J441 Chronic obstructive pulmonary disease with (acute) exacerbation: Secondary | ICD-10-CM | POA: Diagnosis not present

## 2023-09-25 DIAGNOSIS — I2510 Atherosclerotic heart disease of native coronary artery without angina pectoris: Secondary | ICD-10-CM | POA: Diagnosis not present

## 2023-09-25 DIAGNOSIS — R2681 Unsteadiness on feet: Secondary | ICD-10-CM | POA: Diagnosis not present

## 2023-09-25 DIAGNOSIS — J431 Panlobular emphysema: Secondary | ICD-10-CM | POA: Diagnosis not present

## 2023-09-25 DIAGNOSIS — R278 Other lack of coordination: Secondary | ICD-10-CM | POA: Diagnosis not present

## 2023-09-25 DIAGNOSIS — M6281 Muscle weakness (generalized): Secondary | ICD-10-CM | POA: Diagnosis not present

## 2023-09-25 DIAGNOSIS — Z9981 Dependence on supplemental oxygen: Secondary | ICD-10-CM | POA: Diagnosis not present

## 2023-09-25 DIAGNOSIS — R2689 Other abnormalities of gait and mobility: Secondary | ICD-10-CM | POA: Diagnosis not present

## 2023-09-25 DIAGNOSIS — I4891 Unspecified atrial fibrillation: Secondary | ICD-10-CM | POA: Diagnosis not present

## 2023-09-25 DIAGNOSIS — I4811 Longstanding persistent atrial fibrillation: Secondary | ICD-10-CM | POA: Diagnosis not present

## 2023-09-25 DIAGNOSIS — Z741 Need for assistance with personal care: Secondary | ICD-10-CM | POA: Diagnosis not present

## 2023-09-25 DIAGNOSIS — J441 Chronic obstructive pulmonary disease with (acute) exacerbation: Secondary | ICD-10-CM | POA: Diagnosis not present

## 2023-09-25 DIAGNOSIS — J9621 Acute and chronic respiratory failure with hypoxia: Secondary | ICD-10-CM | POA: Diagnosis not present

## 2023-09-26 DIAGNOSIS — Z741 Need for assistance with personal care: Secondary | ICD-10-CM | POA: Diagnosis not present

## 2023-09-26 DIAGNOSIS — I4891 Unspecified atrial fibrillation: Secondary | ICD-10-CM | POA: Diagnosis not present

## 2023-09-26 DIAGNOSIS — R278 Other lack of coordination: Secondary | ICD-10-CM | POA: Diagnosis not present

## 2023-09-26 DIAGNOSIS — R2681 Unsteadiness on feet: Secondary | ICD-10-CM | POA: Diagnosis not present

## 2023-09-26 DIAGNOSIS — Z9981 Dependence on supplemental oxygen: Secondary | ICD-10-CM | POA: Diagnosis not present

## 2023-09-26 DIAGNOSIS — I2510 Atherosclerotic heart disease of native coronary artery without angina pectoris: Secondary | ICD-10-CM | POA: Diagnosis not present

## 2023-09-26 DIAGNOSIS — R2689 Other abnormalities of gait and mobility: Secondary | ICD-10-CM | POA: Diagnosis not present

## 2023-09-26 DIAGNOSIS — J431 Panlobular emphysema: Secondary | ICD-10-CM | POA: Diagnosis not present

## 2023-09-26 DIAGNOSIS — I4811 Longstanding persistent atrial fibrillation: Secondary | ICD-10-CM | POA: Diagnosis not present

## 2023-09-26 DIAGNOSIS — M6281 Muscle weakness (generalized): Secondary | ICD-10-CM | POA: Diagnosis not present

## 2023-09-26 DIAGNOSIS — J9621 Acute and chronic respiratory failure with hypoxia: Secondary | ICD-10-CM | POA: Diagnosis not present

## 2023-09-26 DIAGNOSIS — J441 Chronic obstructive pulmonary disease with (acute) exacerbation: Secondary | ICD-10-CM | POA: Diagnosis not present

## 2023-09-27 ENCOUNTER — Encounter: Payer: Self-pay | Admitting: Internal Medicine

## 2023-09-27 DIAGNOSIS — R2681 Unsteadiness on feet: Secondary | ICD-10-CM | POA: Diagnosis not present

## 2023-09-27 DIAGNOSIS — R2689 Other abnormalities of gait and mobility: Secondary | ICD-10-CM | POA: Diagnosis not present

## 2023-09-27 DIAGNOSIS — Z741 Need for assistance with personal care: Secondary | ICD-10-CM | POA: Diagnosis not present

## 2023-09-27 DIAGNOSIS — R278 Other lack of coordination: Secondary | ICD-10-CM | POA: Diagnosis not present

## 2023-09-27 DIAGNOSIS — J431 Panlobular emphysema: Secondary | ICD-10-CM | POA: Diagnosis not present

## 2023-09-27 DIAGNOSIS — Z9981 Dependence on supplemental oxygen: Secondary | ICD-10-CM | POA: Diagnosis not present

## 2023-09-27 DIAGNOSIS — I4811 Longstanding persistent atrial fibrillation: Secondary | ICD-10-CM | POA: Diagnosis not present

## 2023-09-27 DIAGNOSIS — J441 Chronic obstructive pulmonary disease with (acute) exacerbation: Secondary | ICD-10-CM | POA: Diagnosis not present

## 2023-09-27 DIAGNOSIS — I4891 Unspecified atrial fibrillation: Secondary | ICD-10-CM | POA: Diagnosis not present

## 2023-09-27 DIAGNOSIS — J9621 Acute and chronic respiratory failure with hypoxia: Secondary | ICD-10-CM | POA: Diagnosis not present

## 2023-09-27 DIAGNOSIS — M6281 Muscle weakness (generalized): Secondary | ICD-10-CM | POA: Diagnosis not present

## 2023-09-27 DIAGNOSIS — I2510 Atherosclerotic heart disease of native coronary artery without angina pectoris: Secondary | ICD-10-CM | POA: Diagnosis not present

## 2023-09-30 DIAGNOSIS — J9621 Acute and chronic respiratory failure with hypoxia: Secondary | ICD-10-CM | POA: Diagnosis not present

## 2023-09-30 DIAGNOSIS — M6281 Muscle weakness (generalized): Secondary | ICD-10-CM | POA: Diagnosis not present

## 2023-09-30 DIAGNOSIS — R2681 Unsteadiness on feet: Secondary | ICD-10-CM | POA: Diagnosis not present

## 2023-09-30 DIAGNOSIS — R278 Other lack of coordination: Secondary | ICD-10-CM | POA: Diagnosis not present

## 2023-09-30 DIAGNOSIS — Z741 Need for assistance with personal care: Secondary | ICD-10-CM | POA: Diagnosis not present

## 2023-09-30 DIAGNOSIS — R2689 Other abnormalities of gait and mobility: Secondary | ICD-10-CM | POA: Diagnosis not present

## 2023-10-01 DIAGNOSIS — R278 Other lack of coordination: Secondary | ICD-10-CM | POA: Diagnosis not present

## 2023-10-01 DIAGNOSIS — M6281 Muscle weakness (generalized): Secondary | ICD-10-CM | POA: Diagnosis not present

## 2023-10-01 DIAGNOSIS — R2689 Other abnormalities of gait and mobility: Secondary | ICD-10-CM | POA: Diagnosis not present

## 2023-10-01 DIAGNOSIS — R2681 Unsteadiness on feet: Secondary | ICD-10-CM | POA: Diagnosis not present

## 2023-10-01 DIAGNOSIS — Z741 Need for assistance with personal care: Secondary | ICD-10-CM | POA: Diagnosis not present

## 2023-10-01 DIAGNOSIS — J9621 Acute and chronic respiratory failure with hypoxia: Secondary | ICD-10-CM | POA: Diagnosis not present

## 2023-10-02 ENCOUNTER — Other Ambulatory Visit: Payer: Self-pay | Admitting: Internal Medicine

## 2023-10-02 ENCOUNTER — Ambulatory Visit: Payer: Self-pay

## 2023-10-02 DIAGNOSIS — J9621 Acute and chronic respiratory failure with hypoxia: Secondary | ICD-10-CM | POA: Diagnosis not present

## 2023-10-02 DIAGNOSIS — R2681 Unsteadiness on feet: Secondary | ICD-10-CM | POA: Diagnosis not present

## 2023-10-02 DIAGNOSIS — M6281 Muscle weakness (generalized): Secondary | ICD-10-CM | POA: Diagnosis not present

## 2023-10-02 DIAGNOSIS — Z741 Need for assistance with personal care: Secondary | ICD-10-CM | POA: Diagnosis not present

## 2023-10-02 DIAGNOSIS — J431 Panlobular emphysema: Secondary | ICD-10-CM

## 2023-10-02 DIAGNOSIS — R278 Other lack of coordination: Secondary | ICD-10-CM | POA: Diagnosis not present

## 2023-10-02 DIAGNOSIS — R2689 Other abnormalities of gait and mobility: Secondary | ICD-10-CM | POA: Diagnosis not present

## 2023-10-02 NOTE — Telephone Encounter (Signed)
 Last filled 09-02-23 #30 Last OV 08-21-23 Acute Next OV 01-13-24 [facility]

## 2023-10-02 NOTE — Patient Instructions (Signed)
 Visit Information  Thank you for taking time to visit with me today. Please don't hesitate to contact me if I can be of assistance to you.   Following are the goals we discussed today:   Goals Addressed             This Visit's Progress    Management and education of chronic /health conditions       Interventions Today    Flowsheet Row Most Recent Value  Chronic Disease   Chronic disease during today's visit Chronic Obstructive Pulmonary Disease (COPD), Other  [left calf and buttock wound]  General Interventions   General Interventions Discussed/Reviewed General Interventions Reviewed, Doctor Visits  [evaluation of current treatment plan for listed health conditions and adherence to plan as established by provider.  Assessed for COPD symptoms and wound assessment regarding healing and signs of infection.]  Doctor Visits Discussed/Reviewed Doctor Visits Reviewed  [reviewed upcoming provider office visits and confirmed patient has post hospital/ SNF follow up with primary care provider/ pulmonologist.]  Exercise Interventions   Exercise Discussed/Reviewed Physical Activity  [Assessed current physical activity. Assessed for PT/ OT/ SN services.]  Education Interventions   Education Provided Provided Education  [Reviewed COPD exacerbation symptoms and signs/ symptoms of infection. Advised to contact provider for noted symptoms or call 911 for severe symptoms. Confirmed patient wearing O2 24/7. Assessed oxygen saturation readings.]  Provided Verbal Education On Other  [Assessed for wound care advisement.]  Nutrition Interventions   Nutrition Discussed/Reviewed Nutrition Reviewed  [Discussed appetite and confirmed patient is eating ok.]  Pharmacy Interventions   Pharmacy Dicussed/Reviewed Pharmacy Topics Reviewed  [Attempted to review medications with patient. Patient requested RN case manager review his medications with his wife who was not available at time of call.]               Our next appointment is by telephone on 10/30/23 at 2 pm  Please call the care guide team at [phone] if you need to cancel or reschedule your appointment.   If you are experiencing a Mental Health or Behavioral Health Crisis or need someone to talk to, please call the Suicide and Crisis Lifeline: 988 call 1-800-273-TALK (toll free, 24 hour hotline)  Patient verbalizes understanding of instructions and care plan provided today and agrees to view in MyChart. Active MyChart status and patient understanding of how to access instructions and care plan via MyChart confirmed with patient.     George Ina RN, BSN, CCM [employer], Population Health Case Manager Phone: [phone]

## 2023-10-02 NOTE — Patient Outreach (Signed)
 Care Coordination   Follow Up Visit Note   10/02/2023 Name: [name] [name]. MRN: [id number] DOB: [birth year]  [name] [name]. is a 83 y.o. year old male who sees [name], MD for primary care. I spoke with  [name] [name]. by phone today.  What matters to the patients health and wellness today?  Per chart review patient discharged from [facility] 09/16/23 post hospital stay 08/23/23 to 09/01/23 for COPD exacerbation, influenza A, HF decompensation.  Patient states he has not regained his strength fully.  He reports having PT/ OT at [facility] where he resides and sees the on site nurse regularly due to left calf and buttock wound.  Patient states he is scheduled to be seen at the wound care clinic on 10/07/23 regarding the left calf wound which he states is healing slowly however has no signs of infection.  Patient reports buttocks wound is healing.   Patient states he is returning to some of his activities such as playing cards/ bridge with friends. He states he doesn't go out much due to seasonal exposure to flu/ COVID/ pneumonia, etc.  Patient states he is wearing his oxygen at 3-4 L around the clock. He states his oxygen saturation is low  to mid 90's however will drop in the mid to high 80's with exercise. Patient states he is coughing on occasion bringing up more phlegm that he says is clear in color.  He states he is taking his medications as prescribed.  Patient unable to review medications with RN case manager at time of call due to wife managing medications. Wife not available at time of call to review.    Goals Addressed             This Visit's Progress    Management and education of chronic /health conditions       Interventions Today    Flowsheet Row Most Recent Value  Chronic Disease   Chronic disease during today's visit Chronic Obstructive Pulmonary Disease (COPD), Other  [left calf and buttock wound]  General Interventions   General Interventions  Discussed/Reviewed General Interventions Reviewed, Doctor Visits  [evaluation of current treatment plan for listed health conditions and adherence to plan as established by provider.  Assessed for COPD symptoms and wound assessment regarding healing and signs of infection.]  Doctor Visits Discussed/Reviewed Doctor Visits Reviewed  [reviewed upcoming provider office visits and confirmed patient has post hospital/ SNF follow up with primary care provider/ pulmonologist.]  Exercise Interventions   Exercise Discussed/Reviewed Physical Activity  [Assessed current physical activity. Assessed for PT/ OT/ SN services.]  Education Interventions   Education Provided Provided Education  [Reviewed COPD exacerbation symptoms and signs/ symptoms of infection. Advised to contact provider for noted symptoms or call 911 for severe symptoms. Confirmed patient wearing O2 24/7. Assessed oxygen saturation readings.]  Provided Verbal Education On Other  [Assessed for wound care advisement.]  Nutrition Interventions   Nutrition Discussed/Reviewed Nutrition Reviewed  [Discussed appetite and confirmed patient is eating ok.]  Pharmacy Interventions   Pharmacy Dicussed/Reviewed Pharmacy Topics Reviewed  [Attempted to review medications with patient. Patient requested RN case manager review his medications with his wife who was not available at time of call.]              SDOH assessments and interventions completed:  No     Care Coordination Interventions:  Yes, provided   Follow up plan: Follow up call scheduled for 10/30/23 at 2  pm    Encounter Outcome:  Patient Visit Completed   George Ina RN, BSN, CCM   [facility], Population Health Case Manager Phone: [phone]

## 2023-10-04 DIAGNOSIS — Z741 Need for assistance with personal care: Secondary | ICD-10-CM | POA: Diagnosis not present

## 2023-10-04 DIAGNOSIS — R2681 Unsteadiness on feet: Secondary | ICD-10-CM | POA: Diagnosis not present

## 2023-10-04 DIAGNOSIS — R2689 Other abnormalities of gait and mobility: Secondary | ICD-10-CM | POA: Diagnosis not present

## 2023-10-04 DIAGNOSIS — R278 Other lack of coordination: Secondary | ICD-10-CM | POA: Diagnosis not present

## 2023-10-04 DIAGNOSIS — M6281 Muscle weakness (generalized): Secondary | ICD-10-CM | POA: Diagnosis not present

## 2023-10-04 DIAGNOSIS — J9621 Acute and chronic respiratory failure with hypoxia: Secondary | ICD-10-CM | POA: Diagnosis not present

## 2023-10-07 ENCOUNTER — Encounter: Payer: Medicare Other | Attending: Physician Assistant | Admitting: Physician Assistant

## 2023-10-07 DIAGNOSIS — I129 Hypertensive chronic kidney disease with stage 1 through stage 4 chronic kidney disease, or unspecified chronic kidney disease: Secondary | ICD-10-CM | POA: Insufficient documentation

## 2023-10-07 DIAGNOSIS — R2689 Other abnormalities of gait and mobility: Secondary | ICD-10-CM | POA: Diagnosis not present

## 2023-10-07 DIAGNOSIS — L97822 Non-pressure chronic ulcer of other part of left lower leg with fat layer exposed: Secondary | ICD-10-CM | POA: Diagnosis not present

## 2023-10-07 DIAGNOSIS — I87332 Chronic venous hypertension (idiopathic) with ulcer and inflammation of left lower extremity: Secondary | ICD-10-CM | POA: Diagnosis not present

## 2023-10-07 DIAGNOSIS — I2510 Atherosclerotic heart disease of native coronary artery without angina pectoris: Secondary | ICD-10-CM | POA: Diagnosis not present

## 2023-10-07 DIAGNOSIS — N183 Chronic kidney disease, stage 3 unspecified: Secondary | ICD-10-CM | POA: Insufficient documentation

## 2023-10-07 DIAGNOSIS — L89313 Pressure ulcer of right buttock, stage 3: Secondary | ICD-10-CM | POA: Diagnosis not present

## 2023-10-07 DIAGNOSIS — I480 Paroxysmal atrial fibrillation: Secondary | ICD-10-CM | POA: Insufficient documentation

## 2023-10-07 DIAGNOSIS — J4489 Other specified chronic obstructive pulmonary disease: Secondary | ICD-10-CM | POA: Insufficient documentation

## 2023-10-07 DIAGNOSIS — Z7901 Long term (current) use of anticoagulants: Secondary | ICD-10-CM | POA: Insufficient documentation

## 2023-10-07 DIAGNOSIS — Z87891 Personal history of nicotine dependence: Secondary | ICD-10-CM | POA: Insufficient documentation

## 2023-10-07 DIAGNOSIS — L89152 Pressure ulcer of sacral region, stage 2: Secondary | ICD-10-CM | POA: Diagnosis not present

## 2023-10-07 DIAGNOSIS — S81812A Laceration without foreign body, left lower leg, initial encounter: Secondary | ICD-10-CM | POA: Diagnosis not present

## 2023-10-07 DIAGNOSIS — R278 Other lack of coordination: Secondary | ICD-10-CM | POA: Diagnosis not present

## 2023-10-07 DIAGNOSIS — J9621 Acute and chronic respiratory failure with hypoxia: Secondary | ICD-10-CM | POA: Diagnosis not present

## 2023-10-07 DIAGNOSIS — I7389 Other specified peripheral vascular diseases: Secondary | ICD-10-CM | POA: Diagnosis not present

## 2023-10-07 DIAGNOSIS — Z741 Need for assistance with personal care: Secondary | ICD-10-CM | POA: Diagnosis not present

## 2023-10-07 DIAGNOSIS — L89613 Pressure ulcer of right heel, stage 3: Secondary | ICD-10-CM | POA: Diagnosis present

## 2023-10-07 DIAGNOSIS — R2681 Unsteadiness on feet: Secondary | ICD-10-CM | POA: Diagnosis not present

## 2023-10-07 DIAGNOSIS — M6281 Muscle weakness (generalized): Secondary | ICD-10-CM | POA: Diagnosis not present

## 2023-10-08 DIAGNOSIS — M6281 Muscle weakness (generalized): Secondary | ICD-10-CM | POA: Diagnosis not present

## 2023-10-08 DIAGNOSIS — J9621 Acute and chronic respiratory failure with hypoxia: Secondary | ICD-10-CM | POA: Diagnosis not present

## 2023-10-08 DIAGNOSIS — R2689 Other abnormalities of gait and mobility: Secondary | ICD-10-CM | POA: Diagnosis not present

## 2023-10-08 DIAGNOSIS — Z741 Need for assistance with personal care: Secondary | ICD-10-CM | POA: Diagnosis not present

## 2023-10-08 DIAGNOSIS — R278 Other lack of coordination: Secondary | ICD-10-CM | POA: Diagnosis not present

## 2023-10-08 DIAGNOSIS — R2681 Unsteadiness on feet: Secondary | ICD-10-CM | POA: Diagnosis not present

## 2023-10-09 DIAGNOSIS — R278 Other lack of coordination: Secondary | ICD-10-CM | POA: Diagnosis not present

## 2023-10-09 DIAGNOSIS — R2689 Other abnormalities of gait and mobility: Secondary | ICD-10-CM | POA: Diagnosis not present

## 2023-10-09 DIAGNOSIS — M6281 Muscle weakness (generalized): Secondary | ICD-10-CM | POA: Diagnosis not present

## 2023-10-09 DIAGNOSIS — J9621 Acute and chronic respiratory failure with hypoxia: Secondary | ICD-10-CM | POA: Diagnosis not present

## 2023-10-09 DIAGNOSIS — R2681 Unsteadiness on feet: Secondary | ICD-10-CM | POA: Diagnosis not present

## 2023-10-09 DIAGNOSIS — Z741 Need for assistance with personal care: Secondary | ICD-10-CM | POA: Diagnosis not present

## 2023-10-10 ENCOUNTER — Inpatient Hospital Stay: Payer: Medicare Other | Admitting: Student in an Organized Health Care Education/Training Program

## 2023-10-10 DIAGNOSIS — R278 Other lack of coordination: Secondary | ICD-10-CM | POA: Diagnosis not present

## 2023-10-10 DIAGNOSIS — Z741 Need for assistance with personal care: Secondary | ICD-10-CM | POA: Diagnosis not present

## 2023-10-10 DIAGNOSIS — M6281 Muscle weakness (generalized): Secondary | ICD-10-CM | POA: Diagnosis not present

## 2023-10-10 DIAGNOSIS — R2689 Other abnormalities of gait and mobility: Secondary | ICD-10-CM | POA: Diagnosis not present

## 2023-10-10 DIAGNOSIS — R2681 Unsteadiness on feet: Secondary | ICD-10-CM | POA: Diagnosis not present

## 2023-10-10 DIAGNOSIS — J9621 Acute and chronic respiratory failure with hypoxia: Secondary | ICD-10-CM | POA: Diagnosis not present

## 2023-10-11 DIAGNOSIS — J9621 Acute and chronic respiratory failure with hypoxia: Secondary | ICD-10-CM | POA: Diagnosis not present

## 2023-10-11 DIAGNOSIS — Z741 Need for assistance with personal care: Secondary | ICD-10-CM | POA: Diagnosis not present

## 2023-10-11 DIAGNOSIS — R278 Other lack of coordination: Secondary | ICD-10-CM | POA: Diagnosis not present

## 2023-10-11 DIAGNOSIS — R2681 Unsteadiness on feet: Secondary | ICD-10-CM | POA: Diagnosis not present

## 2023-10-11 DIAGNOSIS — M6281 Muscle weakness (generalized): Secondary | ICD-10-CM | POA: Diagnosis not present

## 2023-10-11 DIAGNOSIS — R2689 Other abnormalities of gait and mobility: Secondary | ICD-10-CM | POA: Diagnosis not present

## 2023-10-14 DIAGNOSIS — M6281 Muscle weakness (generalized): Secondary | ICD-10-CM | POA: Diagnosis not present

## 2023-10-14 DIAGNOSIS — R278 Other lack of coordination: Secondary | ICD-10-CM | POA: Diagnosis not present

## 2023-10-14 DIAGNOSIS — J9621 Acute and chronic respiratory failure with hypoxia: Secondary | ICD-10-CM | POA: Diagnosis not present

## 2023-10-14 DIAGNOSIS — R2681 Unsteadiness on feet: Secondary | ICD-10-CM | POA: Diagnosis not present

## 2023-10-14 DIAGNOSIS — Z741 Need for assistance with personal care: Secondary | ICD-10-CM | POA: Diagnosis not present

## 2023-10-14 DIAGNOSIS — R2689 Other abnormalities of gait and mobility: Secondary | ICD-10-CM | POA: Diagnosis not present

## 2023-10-15 DIAGNOSIS — R278 Other lack of coordination: Secondary | ICD-10-CM | POA: Diagnosis not present

## 2023-10-15 DIAGNOSIS — R2689 Other abnormalities of gait and mobility: Secondary | ICD-10-CM | POA: Diagnosis not present

## 2023-10-15 DIAGNOSIS — Z741 Need for assistance with personal care: Secondary | ICD-10-CM | POA: Diagnosis not present

## 2023-10-15 DIAGNOSIS — M6281 Muscle weakness (generalized): Secondary | ICD-10-CM | POA: Diagnosis not present

## 2023-10-15 DIAGNOSIS — R2681 Unsteadiness on feet: Secondary | ICD-10-CM | POA: Diagnosis not present

## 2023-10-15 DIAGNOSIS — J9621 Acute and chronic respiratory failure with hypoxia: Secondary | ICD-10-CM | POA: Diagnosis not present

## 2023-10-16 ENCOUNTER — Encounter: Admitting: Physician Assistant

## 2023-10-16 DIAGNOSIS — R2681 Unsteadiness on feet: Secondary | ICD-10-CM | POA: Diagnosis not present

## 2023-10-16 DIAGNOSIS — M6281 Muscle weakness (generalized): Secondary | ICD-10-CM | POA: Diagnosis not present

## 2023-10-16 DIAGNOSIS — I2510 Atherosclerotic heart disease of native coronary artery without angina pectoris: Secondary | ICD-10-CM | POA: Diagnosis not present

## 2023-10-16 DIAGNOSIS — R278 Other lack of coordination: Secondary | ICD-10-CM | POA: Diagnosis not present

## 2023-10-16 DIAGNOSIS — J9621 Acute and chronic respiratory failure with hypoxia: Secondary | ICD-10-CM | POA: Diagnosis not present

## 2023-10-16 DIAGNOSIS — R2689 Other abnormalities of gait and mobility: Secondary | ICD-10-CM | POA: Diagnosis not present

## 2023-10-16 DIAGNOSIS — J4489 Other specified chronic obstructive pulmonary disease: Secondary | ICD-10-CM | POA: Diagnosis not present

## 2023-10-16 DIAGNOSIS — I129 Hypertensive chronic kidney disease with stage 1 through stage 4 chronic kidney disease, or unspecified chronic kidney disease: Secondary | ICD-10-CM | POA: Diagnosis not present

## 2023-10-16 DIAGNOSIS — L89313 Pressure ulcer of right buttock, stage 3: Secondary | ICD-10-CM | POA: Diagnosis not present

## 2023-10-16 DIAGNOSIS — I7389 Other specified peripheral vascular diseases: Secondary | ICD-10-CM | POA: Diagnosis not present

## 2023-10-16 DIAGNOSIS — Z741 Need for assistance with personal care: Secondary | ICD-10-CM | POA: Diagnosis not present

## 2023-10-16 DIAGNOSIS — Z87891 Personal history of nicotine dependence: Secondary | ICD-10-CM | POA: Diagnosis not present

## 2023-10-16 DIAGNOSIS — Z7901 Long term (current) use of anticoagulants: Secondary | ICD-10-CM | POA: Diagnosis not present

## 2023-10-16 DIAGNOSIS — N183 Chronic kidney disease, stage 3 unspecified: Secondary | ICD-10-CM | POA: Diagnosis not present

## 2023-10-16 DIAGNOSIS — L97822 Non-pressure chronic ulcer of other part of left lower leg with fat layer exposed: Secondary | ICD-10-CM | POA: Diagnosis not present

## 2023-10-16 DIAGNOSIS — I480 Paroxysmal atrial fibrillation: Secondary | ICD-10-CM | POA: Diagnosis not present

## 2023-10-16 DIAGNOSIS — I87332 Chronic venous hypertension (idiopathic) with ulcer and inflammation of left lower extremity: Secondary | ICD-10-CM | POA: Diagnosis not present

## 2023-10-17 DIAGNOSIS — R278 Other lack of coordination: Secondary | ICD-10-CM | POA: Diagnosis not present

## 2023-10-17 DIAGNOSIS — Z741 Need for assistance with personal care: Secondary | ICD-10-CM | POA: Diagnosis not present

## 2023-10-17 DIAGNOSIS — M6281 Muscle weakness (generalized): Secondary | ICD-10-CM | POA: Diagnosis not present

## 2023-10-17 DIAGNOSIS — J9621 Acute and chronic respiratory failure with hypoxia: Secondary | ICD-10-CM | POA: Diagnosis not present

## 2023-10-17 DIAGNOSIS — R2681 Unsteadiness on feet: Secondary | ICD-10-CM | POA: Diagnosis not present

## 2023-10-17 DIAGNOSIS — R2689 Other abnormalities of gait and mobility: Secondary | ICD-10-CM | POA: Diagnosis not present

## 2023-10-18 DIAGNOSIS — R278 Other lack of coordination: Secondary | ICD-10-CM | POA: Diagnosis not present

## 2023-10-18 DIAGNOSIS — M6281 Muscle weakness (generalized): Secondary | ICD-10-CM | POA: Diagnosis not present

## 2023-10-18 DIAGNOSIS — J9621 Acute and chronic respiratory failure with hypoxia: Secondary | ICD-10-CM | POA: Diagnosis not present

## 2023-10-18 DIAGNOSIS — Z741 Need for assistance with personal care: Secondary | ICD-10-CM | POA: Diagnosis not present

## 2023-10-18 DIAGNOSIS — R2689 Other abnormalities of gait and mobility: Secondary | ICD-10-CM | POA: Diagnosis not present

## 2023-10-18 DIAGNOSIS — R2681 Unsteadiness on feet: Secondary | ICD-10-CM | POA: Diagnosis not present

## 2023-10-21 ENCOUNTER — Ambulatory Visit: Payer: Self-pay | Admitting: Internal Medicine

## 2023-10-21 ENCOUNTER — Inpatient Hospital Stay
Admission: EM | Admit: 2023-10-21 | Discharge: 2023-10-29 | DRG: 189 | Disposition: A | Discharge status: Home / Self Care | Attending: Student in an Organized Health Care Education/Training Program | Admitting: Student in an Organized Health Care Education/Training Program

## 2023-10-21 ENCOUNTER — Ambulatory Visit: Admitting: Internal Medicine

## 2023-10-21 ENCOUNTER — Encounter: Payer: Self-pay | Admitting: Emergency Medicine

## 2023-10-21 ENCOUNTER — Other Ambulatory Visit: Payer: Self-pay

## 2023-10-21 ENCOUNTER — Emergency Department

## 2023-10-21 DIAGNOSIS — Z79899 Other long term (current) drug therapy: Secondary | ICD-10-CM | POA: Diagnosis not present

## 2023-10-21 DIAGNOSIS — J9621 Acute and chronic respiratory failure with hypoxia: Secondary | ICD-10-CM | POA: Diagnosis not present

## 2023-10-21 DIAGNOSIS — R062 Wheezing: Secondary | ICD-10-CM | POA: Diagnosis not present

## 2023-10-21 DIAGNOSIS — I110 Hypertensive heart disease with heart failure: Secondary | ICD-10-CM | POA: Diagnosis present

## 2023-10-21 DIAGNOSIS — I499 Cardiac arrhythmia, unspecified: Secondary | ICD-10-CM | POA: Diagnosis not present

## 2023-10-21 DIAGNOSIS — K219 Gastro-esophageal reflux disease without esophagitis: Secondary | ICD-10-CM | POA: Diagnosis not present

## 2023-10-21 DIAGNOSIS — I739 Peripheral vascular disease, unspecified: Secondary | ICD-10-CM | POA: Diagnosis present

## 2023-10-21 DIAGNOSIS — Z66 Do not resuscitate: Secondary | ICD-10-CM | POA: Diagnosis not present

## 2023-10-21 DIAGNOSIS — J441 Chronic obstructive pulmonary disease with (acute) exacerbation: Secondary | ICD-10-CM | POA: Diagnosis not present

## 2023-10-21 DIAGNOSIS — Z8546 Personal history of malignant neoplasm of prostate: Secondary | ICD-10-CM | POA: Diagnosis not present

## 2023-10-21 DIAGNOSIS — I480 Paroxysmal atrial fibrillation: Secondary | ICD-10-CM | POA: Diagnosis not present

## 2023-10-21 DIAGNOSIS — Z1152 Encounter for screening for COVID-19: Secondary | ICD-10-CM | POA: Diagnosis not present

## 2023-10-21 DIAGNOSIS — Z87891 Personal history of nicotine dependence: Secondary | ICD-10-CM | POA: Diagnosis not present

## 2023-10-21 DIAGNOSIS — J449 Chronic obstructive pulmonary disease, unspecified: Secondary | ICD-10-CM | POA: Diagnosis not present

## 2023-10-21 DIAGNOSIS — I5033 Acute on chronic diastolic (congestive) heart failure: Secondary | ICD-10-CM | POA: Diagnosis present

## 2023-10-21 DIAGNOSIS — Z85828 Personal history of other malignant neoplasm of skin: Secondary | ICD-10-CM

## 2023-10-21 DIAGNOSIS — I2510 Atherosclerotic heart disease of native coronary artery without angina pectoris: Secondary | ICD-10-CM | POA: Diagnosis not present

## 2023-10-21 DIAGNOSIS — M1810 Unilateral primary osteoarthritis of first carpometacarpal joint, unspecified hand: Secondary | ICD-10-CM | POA: Diagnosis present

## 2023-10-21 DIAGNOSIS — J432 Centrilobular emphysema: Secondary | ICD-10-CM | POA: Diagnosis not present

## 2023-10-21 DIAGNOSIS — Z7952 Long term (current) use of systemic steroids: Secondary | ICD-10-CM

## 2023-10-21 DIAGNOSIS — Z515 Encounter for palliative care: Secondary | ICD-10-CM | POA: Diagnosis not present

## 2023-10-21 DIAGNOSIS — Z955 Presence of coronary angioplasty implant and graft: Secondary | ICD-10-CM

## 2023-10-21 DIAGNOSIS — R2689 Other abnormalities of gait and mobility: Secondary | ICD-10-CM | POA: Diagnosis not present

## 2023-10-21 DIAGNOSIS — M1710 Unilateral primary osteoarthritis, unspecified knee: Secondary | ICD-10-CM | POA: Diagnosis present

## 2023-10-21 DIAGNOSIS — J9622 Acute and chronic respiratory failure with hypercapnia: Secondary | ICD-10-CM | POA: Diagnosis not present

## 2023-10-21 DIAGNOSIS — L89152 Pressure ulcer of sacral region, stage 2: Secondary | ICD-10-CM | POA: Diagnosis present

## 2023-10-21 DIAGNOSIS — E874 Mixed disorder of acid-base balance: Secondary | ICD-10-CM | POA: Diagnosis not present

## 2023-10-21 DIAGNOSIS — J438 Other emphysema: Secondary | ICD-10-CM | POA: Diagnosis not present

## 2023-10-21 DIAGNOSIS — Z743 Need for continuous supervision: Secondary | ICD-10-CM | POA: Diagnosis not present

## 2023-10-21 DIAGNOSIS — R0689 Other abnormalities of breathing: Secondary | ICD-10-CM

## 2023-10-21 DIAGNOSIS — Z7901 Long term (current) use of anticoagulants: Secondary | ICD-10-CM | POA: Diagnosis not present

## 2023-10-21 DIAGNOSIS — R2681 Unsteadiness on feet: Secondary | ICD-10-CM | POA: Diagnosis not present

## 2023-10-21 DIAGNOSIS — Z8249 Family history of ischemic heart disease and other diseases of the circulatory system: Secondary | ICD-10-CM

## 2023-10-21 DIAGNOSIS — M6281 Muscle weakness (generalized): Secondary | ICD-10-CM | POA: Diagnosis not present

## 2023-10-21 DIAGNOSIS — Z741 Need for assistance with personal care: Secondary | ICD-10-CM | POA: Diagnosis not present

## 2023-10-21 DIAGNOSIS — R278 Other lack of coordination: Secondary | ICD-10-CM | POA: Diagnosis not present

## 2023-10-21 LAB — BLOOD GAS, VENOUS
Acid-Base Excess: 10.2 mmol/L — ABNORMAL HIGH (ref 0.0–2.0)
Acid-Base Excess: 6.5 mmol/L — ABNORMAL HIGH (ref 0.0–2.0)
Acid-Base Excess: 10.6 mmol/L — ABNORMAL HIGH (ref 0.0–2.0)
Bicarbonate: 40.4 mmol/L — ABNORMAL HIGH (ref 20.0–28.0)
Bicarbonate: 36.3 mmol/L — ABNORMAL HIGH (ref 20.0–28.0)
Bicarbonate: 38.7 mmol/L — ABNORMAL HIGH (ref 20.0–28.0)
O2 Saturation: 53.4 %
O2 Saturation: 63.1 %
O2 Saturation: 57.3 %
Patient temperature: 37
Patient temperature: 37
Patient temperature: 37
pCO2, Ven: 84 mmHg (ref 44–60)
pCO2, Ven: 79 mmHg (ref 44–60)
pCO2, Ven: 67 mmHg — ABNORMAL HIGH (ref 44–60)
pH, Ven: 7.29 (ref 7.25–7.43)
pH, Ven: 7.27 (ref 7.25–7.43)
pH, Ven: 7.37 (ref 7.25–7.43)
pO2, Ven: 35 mmHg (ref 32–45)
pO2, Ven: 40 mmHg (ref 32–45)
pO2, Ven: 33 mmHg (ref 32–45)

## 2023-10-21 LAB — BASIC METABOLIC PANEL
Anion gap: 11 (ref 5–15)
Anion gap: 13 (ref 5–15)
BUN: 14 mg/dL (ref 8–23)
BUN: 19 mg/dL (ref 8–23)
CO2: 33 mmol/L — ABNORMAL HIGH (ref 22–32)
CO2: 32 mmol/L (ref 22–32)
Calcium: 8.5 mg/dL — ABNORMAL LOW (ref 8.9–10.3)
Calcium: 8.3 mg/dL — ABNORMAL LOW (ref 8.9–10.3)
Chloride: 94 mmol/L — ABNORMAL LOW (ref 98–111)
Chloride: 91 mmol/L — ABNORMAL LOW (ref 98–111)
Creatinine, Ser: 0.86 mg/dL (ref 0.61–1.24)
Creatinine, Ser: 0.9 mg/dL (ref 0.61–1.24)
GFR, Estimated: >60 mL/min (ref >60)
GFR, Estimated: >60 mL/min (ref >60)
Glucose, Bld: 105 mg/dL — ABNORMAL HIGH (ref 70–99)
Glucose, Bld: 178 mg/dL — ABNORMAL HIGH (ref 70–99)
Potassium: 3.7 mmol/L (ref 3.5–5.1)
Potassium: 3.8 mmol/L (ref 3.5–5.1)
Sodium: 138 mmol/L (ref 135–145)
Sodium: 136 mmol/L (ref 135–145)

## 2023-10-21 LAB — CBC
HCT: 38.7 % — ABNORMAL LOW (ref 39.0–52.0)
Hemoglobin: 12 g/dL — ABNORMAL LOW (ref 13.0–17.0)
MCH: 29.1 pg (ref 26.0–34.0)
MCHC: 31 g/dL (ref 30.0–36.0)
MCV: 93.9 fL (ref 80.0–100.0)
Platelets: 306 10*3/uL (ref 150–400)
RBC: 4.12 MIL/uL — ABNORMAL LOW (ref 4.22–5.81)
RDW: 15.6 % — ABNORMAL HIGH (ref 11.5–15.5)
WBC: 13 10*3/uL — ABNORMAL HIGH (ref 4.0–10.5)
nRBC: 0 % (ref 0.0–0.2)

## 2023-10-21 LAB — BRAIN NATRIURETIC PEPTIDE: B Natriuretic Peptide: 53.5 pg/mL (ref 0.0–100.0)

## 2023-10-21 LAB — RESP PANEL BY RT-PCR (RSV, FLU A&B, COVID)  RVPGX2
Influenza A by PCR: NEGATIVE
Influenza B by PCR: NEGATIVE
Resp Syncytial Virus by PCR: NEGATIVE
SARS Coronavirus 2 by RT PCR: NEGATIVE

## 2023-10-21 LAB — MAGNESIUM: Magnesium: 1.9 mg/dL (ref 1.7–2.4)

## 2023-10-21 LAB — LACTIC ACID, PLASMA
Lactic Acid, Venous: 1.6 mmol/L (ref 0.5–1.9)
Lactic Acid, Venous: 1.7 mmol/L (ref 0.5–1.9)

## 2023-10-21 LAB — TROPONIN I (HIGH SENSITIVITY): Troponin I (High Sensitivity): 13 ng/L (ref <18)

## 2023-10-21 MED ORDER — IPRATROPIUM-ALBUTEROL 0.5-2.5 (3) MG/3ML IN SOLN
9.0000 mL | Freq: Once | RESPIRATORY_TRACT | Status: AC
  Administered 2023-10-21: 9 mL via RESPIRATORY_TRACT
  Filled 2023-10-21: qty 9

## 2023-10-21 MED ORDER — IPRATROPIUM BROMIDE 0.02 % IN SOLN
0.5000 mg | Freq: Four times a day (QID) | RESPIRATORY_TRACT | Status: DC
  Administered 2023-10-21 – 2023-10-24 (×12): 0.5 mg via RESPIRATORY_TRACT
  Filled 2023-10-21 (×12): qty 2.5

## 2023-10-21 MED ORDER — FINASTERIDE 5 MG PO TABS
5.0000 mg | ORAL_TABLET | Freq: Every day | ORAL | Status: DC
  Administered 2023-10-22 – 2023-10-29 (×8): 5 mg via ORAL
  Filled 2023-10-21 (×8): qty 1

## 2023-10-21 MED ORDER — SODIUM CHLORIDE 0.9 % IV SOLN: 250.0000 mL | INTRAVENOUS | Status: AC | PRN

## 2023-10-21 MED ORDER — POLYETHYLENE GLYCOL 3350 17 G PO PACK
17.0000 g | PACK | Freq: Every day | ORAL | Status: DC | PRN
  Administered 2023-10-26 – 2023-10-27 (×2): 17 g via ORAL

## 2023-10-21 MED ORDER — CLONAZEPAM 0.5 MG PO TABS
0.5000 mg | ORAL_TABLET | Freq: Every day | ORAL | Status: DC
  Administered 2023-10-21 – 2023-10-28 (×8): 0.5 mg via ORAL
  Filled 2023-10-21 (×8): qty 1

## 2023-10-21 MED ORDER — GABAPENTIN 100 MG PO CAPS
200.0000 mg | ORAL_CAPSULE | Freq: Every day | ORAL | Status: DC
  Administered 2023-10-21 – 2023-10-28 (×8): 200 mg via ORAL
  Filled 2023-10-21 (×8): qty 2

## 2023-10-21 MED ORDER — APIXABAN 5 MG PO TABS
5.0000 mg | ORAL_TABLET | Freq: Two times a day (BID) | ORAL | Status: DC
  Administered 2023-10-21 – 2023-10-29 (×16): 5 mg via ORAL
  Filled 2023-10-21 (×16): qty 1

## 2023-10-21 MED ORDER — DOXYCYCLINE HYCLATE 100 MG PO TABS
100.0000 mg | ORAL_TABLET | Freq: Two times a day (BID) | ORAL | Status: AC
  Administered 2023-10-21 – 2023-10-25 (×10): 100 mg via ORAL
  Filled 2023-10-21 (×10): qty 1

## 2023-10-21 MED ORDER — PANTOPRAZOLE SODIUM 40 MG PO TBEC
40.0000 mg | DELAYED_RELEASE_TABLET | Freq: Every day | ORAL | Status: DC
  Administered 2023-10-22 – 2023-10-29 (×8): 40 mg via ORAL
  Filled 2023-10-21 (×8): qty 1

## 2023-10-21 MED ORDER — POTASSIUM CHLORIDE CRYS ER 20 MEQ PO TBCR
40.0000 meq | EXTENDED_RELEASE_TABLET | Freq: Once | ORAL | Status: AC
Start: 2023-10-21 — End: 2023-10-21
  Administered 2023-10-21: 40 meq via ORAL
  Filled 2023-10-21: qty 2

## 2023-10-21 MED ORDER — METOPROLOL TARTRATE 5 MG/5ML IV SOLN: 5.0000 mg | INTRAVENOUS | Status: DC | PRN

## 2023-10-21 MED ORDER — MAGNESIUM SULFATE 2 GM/50ML IV SOLN
2.0000 g | Freq: Once | INTRAVENOUS | Status: AC
Start: 2023-10-21 — End: 2023-10-21
  Administered 2023-10-21: 2 g via INTRAVENOUS
  Filled 2023-10-21: qty 50

## 2023-10-21 MED ORDER — PREDNISONE 20 MG PO TABS
40.0000 mg | ORAL_TABLET | Freq: Every day | ORAL | Status: AC
  Administered 2023-10-23 – 2023-10-26 (×4): 40 mg via ORAL
  Filled 2023-10-21 (×3): qty 2
  Filled 2023-10-21: qty 4

## 2023-10-21 MED ORDER — METHYLPREDNISOLONE SODIUM SUCC 125 MG IJ SOLR
125.0000 mg | Freq: Once | INTRAMUSCULAR | Status: AC
  Administered 2023-10-21: 125 mg via INTRAVENOUS
  Filled 2023-10-21: qty 2

## 2023-10-21 MED ORDER — DILTIAZEM HCL ER COATED BEADS 120 MG PO CP24
120.0000 mg | ORAL_CAPSULE | Freq: Two times a day (BID) | ORAL | Status: DC
  Administered 2023-10-21 – 2023-10-29 (×16): 120 mg via ORAL
  Filled 2023-10-21 (×16): qty 1

## 2023-10-21 MED ORDER — SENNA 8.6 MG PO TABS
2.0000 | ORAL_TABLET | Freq: Every day | ORAL | Status: DC
  Administered 2023-10-21 – 2023-10-28 (×8): 17.2 mg via ORAL
  Filled 2023-10-21 (×8): qty 2

## 2023-10-21 MED ORDER — SODIUM CHLORIDE 0.9 % IV SOLN
1.0000 g | Freq: Once | INTRAVENOUS | Status: AC
  Administered 2023-10-21: 1 g via INTRAVENOUS
  Filled 2023-10-21: qty 10

## 2023-10-21 MED ORDER — ALBUTEROL SULFATE (2.5 MG/3ML) 0.083% IN NEBU: 3.0000 mL | INHALATION_SOLUTION | Freq: Four times a day (QID) | RESPIRATORY_TRACT | Status: DC | PRN

## 2023-10-21 MED ORDER — FUROSEMIDE 10 MG/ML IJ SOLN
20.0000 mg | Freq: Two times a day (BID) | INTRAMUSCULAR | Status: DC
  Administered 2023-10-21 – 2023-10-22 (×2): 20 mg via INTRAVENOUS
  Filled 2023-10-21 (×3): qty 4

## 2023-10-21 MED ORDER — SODIUM CHLORIDE 0.9% FLUSH
3.0000 mL | Freq: Two times a day (BID) | INTRAVENOUS | Status: DC
  Administered 2023-10-21 – 2023-10-29 (×16): 3 mL via INTRAVENOUS

## 2023-10-21 MED ORDER — BUDESON-GLYCOPYRROL-FORMOTEROL 160-9-4.8 MCG/ACT IN AERO: 2.0000 | INHALATION_SPRAY | Freq: Two times a day (BID) | RESPIRATORY_TRACT | Status: DC

## 2023-10-21 MED ORDER — SODIUM CHLORIDE 0.9% FLUSH: 3.0000 mL | INTRAVENOUS | Status: DC | PRN

## 2023-10-21 MED ORDER — DOFETILIDE 250 MCG PO CAPS
250.0000 ug | ORAL_CAPSULE | Freq: Two times a day (BID) | ORAL | Status: DC
  Administered 2023-10-21 – 2023-10-29 (×16): 250 ug via ORAL
  Filled 2023-10-21 (×17): qty 1

## 2023-10-21 MED ORDER — ROSUVASTATIN CALCIUM 10 MG PO TABS
20.0000 mg | ORAL_TABLET | Freq: Every day | ORAL | Status: DC
  Administered 2023-10-21 – 2023-10-28 (×8): 20 mg via ORAL
  Filled 2023-10-21: qty 2
  Filled 2023-10-21: qty 4
  Filled 2023-10-21 (×4): qty 2
  Filled 2023-10-21: qty 1
  Filled 2023-10-21: qty 2

## 2023-10-21 MED ORDER — FUROSEMIDE 40 MG PO TABS: 20.0000 mg | ORAL_TABLET | Freq: Every day | ORAL | Status: DC

## 2023-10-21 MED ORDER — ACETAMINOPHEN 325 MG PO TABS: 650.0000 mg | ORAL_TABLET | Freq: Four times a day (QID) | ORAL | Status: DC | PRN

## 2023-10-21 MED ORDER — METHYLPREDNISOLONE SODIUM SUCC 40 MG IJ SOLR
40.0000 mg | Freq: Two times a day (BID) | INTRAMUSCULAR | Status: AC
  Administered 2023-10-21 – 2023-10-22 (×2): 40 mg via INTRAVENOUS
  Filled 2023-10-21 (×2): qty 1

## 2023-10-21 MED ORDER — ONDANSETRON HCL 4 MG PO TABS: 4.0000 mg | ORAL_TABLET | Freq: Four times a day (QID) | ORAL | Status: DC | PRN

## 2023-10-21 MED ORDER — ONDANSETRON HCL 4 MG/2ML IJ SOLN: 4.0000 mg | Freq: Four times a day (QID) | INTRAMUSCULAR | Status: DC | PRN

## 2023-10-21 MED ORDER — LEVALBUTEROL HCL 0.63 MG/3ML IN NEBU
0.6300 mg | INHALATION_SOLUTION | Freq: Four times a day (QID) | RESPIRATORY_TRACT | Status: DC | PRN
  Filled 2023-10-21: qty 3

## 2023-10-21 MED ORDER — BUDESONIDE 0.5 MG/2ML IN SUSP
2.0000 mg | Freq: Two times a day (BID) | RESPIRATORY_TRACT | Status: DC
  Administered 2023-10-21 – 2023-10-22 (×3): 2 mg via RESPIRATORY_TRACT
  Filled 2023-10-21 (×3): qty 8

## 2023-10-21 MED ORDER — ACETAMINOPHEN 650 MG RE SUPP: 650.0000 mg | Freq: Four times a day (QID) | RECTAL | Status: DC | PRN

## 2023-10-21 MED ORDER — ACETAZOLAMIDE SODIUM 500 MG IJ SOLR
250.0000 mg | Freq: Once | INTRAMUSCULAR | Status: AC
  Administered 2023-10-21: 250 mg via INTRAVENOUS
  Filled 2023-10-21: qty 250

## 2023-10-21 MED ORDER — IPRATROPIUM-ALBUTEROL 0.5-2.5 (3) MG/3ML IN SOLN: 3.0000 mL | Freq: Four times a day (QID) | RESPIRATORY_TRACT | Status: DC

## 2023-10-21 NOTE — ED Notes (Addendum)
 VBG = 7.29 CO2 = 84  Bicarb = 40

## 2023-10-21 NOTE — Telephone Encounter (Signed)
 Okay---I will check him at the appointment today

## 2023-10-21 NOTE — Telephone Encounter (Signed)
 Copied from CRM [id number]. Topic: Clinical - Red Word Triage >> Oct 21, 2023  8:13 AM Almira Coaster wrote: Red Word that prompted transfer to Nurse Triage: Patient's wife is calling because patient is experiencing difficulty breathing, cough and suspects a possible UTI.  Call dropped prior to transfer. Attempted to reach pt but unable to LM.

## 2023-10-21 NOTE — ED Notes (Signed)
 Patient received a quarter sized skin tear secondary to the tourniquet being placed for the second IV access. The tear was bandaged.

## 2023-10-21 NOTE — ED Triage Notes (Signed)
 Patient to ED via [facility] from home for SOB. Given duo-ned and albuterol with EMS. Wears 3-4L [place] at baseline. Given 125 solu-medrol. Wheezing noted with productive cough. Initially 94% on baseline 4L. Hx advanced COPD 20 L AC  EMS VS:  149/72 100% with nebs 98.3 temp

## 2023-10-21 NOTE — ED Notes (Signed)
 See triage notes. Patient c/o shortness of breath. Patient wears 3-4L via [place] at baseline. Patient was given Duoneb and albuterol by EMS. Hx of COPD.

## 2023-10-21 NOTE — Consult Note (Signed)
 MEDICATION RELATED CONSULT NOTE - INITIAL   Pharmacy Consult for dofetilide Indication: Afib  Allergies  Allergen Reactions   Flagyl [Metronidazole] Other (See Comments)    Other reaction(s): skin reaction    Patient Measurements: Height: 5' 10.5" (179.1 cm) Weight: 68 kg (150 lb) IBW/kg (Calculated) : 74.15  Vital Signs: Temp: 98.3 F (36.8 C) (03/24 1624) Temp Source: Oral (03/24 1624) BP: 124/63 (03/24 1530) Pulse Rate: 94 (03/24 1530) Intake/Output from previous day: No intake/output data recorded. Intake/Output from this shift: No intake/output data recorded.  Labs: Recent Labs    10/21/23 1212  WBC 13.0*  HGB 12.0*  HCT 38.7*  PLT 306  CREATININE 0.86  MG 1.9   Estimated Creatinine Clearance: 63.7 mL/min (by C-G formula based on SCr of 0.86 mg/dL).   Microbiology: Recent Results (from the past 720 hours)  Resp panel by RT-PCR (RSV, Flu A&B, Covid) Anterior Nasal Swab     Status: None   Collection Time: 10/21/23  1:26 PM   Specimen: Anterior Nasal Swab  Result Value Ref Range Status   SARS Coronavirus 2 by RT PCR NEGATIVE NEGATIVE Final    Comment: (NOTE) SARS-CoV-2 target nucleic acids are NOT DETECTED.  The SARS-CoV-2 RNA is generally detectable in upper respiratory specimens during the acute phase of infection. The lowest concentration of SARS-CoV-2 viral copies this assay can detect is 138 copies/mL. A negative result does not preclude SARS-Cov-2 infection and should not be used as the sole basis for treatment or other patient management decisions. A negative result may occur with  improper specimen collection/handling, submission of specimen other than nasopharyngeal swab, presence of viral mutation(s) within the areas targeted by this assay, and inadequate number of viral copies(<138 copies/mL). A negative result must be combined with clinical observations, patient history, and epidemiological information. The expected result is  Negative.  Fact Sheet for Patients:  BloggerCourse.com  Fact Sheet for Healthcare Providers:  SeriousBroker.it  This test is no t yet approved or cleared by the [place] FDA and  has been authorized for detection and/or diagnosis of SARS-CoV-2 by FDA under an Emergency Use Authorization (EUA). This EUA will remain  in effect (meaning this test can be used) for the duration of the COVID-19 declaration under Section 564(b)(1) of the Act, 21 U.S.C.section 360bbb-3(b)(1), unless the authorization is terminated  or revoked sooner.       Influenza A by PCR NEGATIVE NEGATIVE Final   Influenza B by PCR NEGATIVE NEGATIVE Final    Comment: (NOTE) The Xpert Xpress SARS-CoV-2/FLU/RSV plus assay is intended as an aid in the diagnosis of influenza from Nasopharyngeal swab specimens and should not be used as a sole basis for treatment. Nasal washings and aspirates are unacceptable for Xpert Xpress SARS-CoV-2/FLU/RSV testing.  Fact Sheet for Patients: BloggerCourse.com  Fact Sheet for Healthcare Providers: SeriousBroker.it  This test is not yet approved or cleared by the [place] FDA and has been authorized for detection and/or diagnosis of SARS-CoV-2 by FDA under an Emergency Use Authorization (EUA). This EUA will remain in effect (meaning this test can be used) for the duration of the COVID-19 declaration under Section 564(b)(1) of the Act, 21 U.S.C. section 360bbb-3(b)(1), unless the authorization is terminated or revoked.     Resp Syncytial Virus by PCR NEGATIVE NEGATIVE Final    Comment: (NOTE) Fact Sheet for Patients: BloggerCourse.com  Fact Sheet for Healthcare Providers: SeriousBroker.it  This test is not yet approved or cleared by the [place] and has been authorized  for detection and/or diagnosis of  SARS-CoV-2 by FDA under an Emergency Use Authorization (EUA). This EUA will remain in effect (meaning this test can be used) for the duration of the COVID-19 declaration under Section 564(b)(1) of the Act, 21 U.S.C. section 360bbb-3(b)(1), unless the authorization is terminated or revoked.  Performed at [facility], [address] [place], [place] [address]     Medical History: Past Medical History:  Diagnosis Date   Abdominal discomfort 12/07/2020   Acquired trigger finger of right middle finger 02/04/2020   Acute prostatitis 04/08/2018   Alcohol abuse    Alcohol dependence (HCC) 12/07/2020   Anal or rectal pain 04/08/2018   Annual physical exam 12/07/2020   Anorectal disorder 12/07/2020   Arthritis    Atherosclerotic heart disease of native coronary artery without angina pectoris 04/08/2018   a. 03/2018 PCI: LM nl, LAD 41m, LCX nl, OM1 70p (2.75x8 Synergy DES), 55m (2.5x24 Synergy DES), RCA 25p, RPL1 50. EF 55-65%.   Atrial flutter (HCC)    a. 07/2022 in setting of hospitalization for resp illness/RSV. CHA2DS2VASc = 4-->eliquis; b. 08/2022 s/p DCCV (100J); c. 09/26/2022 Recurrent Aflutter.   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 02/2022 Carotid U/S: 1-39% bilat ICA stenoses.   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   Contracture of palmar fascia 12/07/2020   COPD (chronic obstructive pulmonary disease) (HCC)    Corn of toe 12/07/2020   Corns and callosities 04/09/2018   Coronary artery disease    2019 with  stents   Coronary atherosclerosis 05/07/2018   Degenerative disc disease, 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   Degenerative disc disease, 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   Diarrhea 04/09/2018   Double vision with both eyes open 12/07/2020   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   Dysphagia 08/14/2018   Dyspnea    ED (erectile dysfunction)    ED (erectile dysfunction) of organic origin 12/07/2020   Elevated prostate specific antigen (PSA) 04/08/2018   Elevated PSA    Encounter for screening for other disorder 12/07/2020   Enlarged prostate without lower urinary tract symptoms (luts) 04/09/2018   Enthesopathy 12/07/2020   Essential (primary) hypertension 04/08/2018   Ex-smoker 04/09/2018   Exertional dyspnea 04/02/2018   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 07/2022 Echo: EF 65-70%, no rwma, mild LVH, nl RV fxn.   Hyperlipidemia    Hypertension    Hypoxemia 12/07/2020   Kidney cysts    Left knee pain 12/07/2020   Low back pain 04/08/2018   Male erectile dysfunction 04/09/2018   Mixed hyperlipidemia 04/08/2018   Nocturia more than twice per night 04/09/2018   Osteoarthritis of first carpometacarpal joint 04/08/2018   Osteoarthritis of knee 12/07/2020   Other long term (current) drug therapy 12/07/2020   Pain due to onychomycosis of toenail of left foot 07/20/2019   Pain in joint 04/09/2018   Pain in knee 04/09/2018  Pain in unspecified knee 12/07/2020   Pain of finger 04/09/2018   Palpitations    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HCC) 12/07/2020   Personal history of colonic polyps 12/07/2020   Pneumonia    Pneumothorax 04/12/2020   Polypharmacy 04/09/2018   Porokeratosis 01/13/2019   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HCC)    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   Pulmonary nodules 06/08/2016   Pure  hypercholesterolemia 12/07/2020   Renal cyst 12/07/2020   Sleep disorder 04/08/2018   Status post bronchoscopy 04/12/2020   Tear of rotator cuff 04/09/2018   Tobacco user 12/07/2020   Uncomplicated alcohol dependence (HCC) 04/09/2018   Unspecified rotator cuff tear or rupture of unspecified shoulder, not specified as traumatic 12/07/2020   Unspecified tear of unspecified meniscus, current injury, unspecified knee, initial encounter 12/07/2020   Visual disturbance 12/07/2020   Vitamin D deficiency 04/08/2018   Recent Labs  Lab 10/21/23 1212  K 3.7  MG 1.9    Medications:  Dofetilide 250 mcg PO twice daily  Assessment: 83 yo M with PMH including pAF, COPD, HTN, HFpEF, CAD s/p stenting (2019) presenting with COPDe. Patient is on dofetilide for Afib and pharmacy has been consulting for managing electrolytes, most notably magnesium and potassium, and monitoring drug-drug interactions during admission. Qtc <500. No new drug-drug interactions noted. Last dose of dofetilide prior to admission was 3/24 morning time.  Goal of Therapy:  K >4.0 and Mg >2.0  Plan: K 3.7 >> potassium chloride 40 mEq PO x 1 Mg 1.9 >> magnesium sulfate 2 g IV x 1 Continue to monitor potassium and magnesium daily Continue to monitor for new drug-drug interactions and Qtc prolongation  Will M. Dareen Piano, PharmD Clinical Pharmacist 10/21/2023 4:53 PM

## 2023-10-21 NOTE — Telephone Encounter (Signed)
  Chief Complaint: sob Symptoms: sob and cough Frequency: friday Pertinent Negatives: Patient denies fever Disposition: [] ED /[] Urgent Care (no appt availability in office) / [x] Appointment(In office/virtual)/ []  [place] [facility]/ [] Home Care/ [] Refused Recommended Disposition /[] [place] [facility]/ []  Follow-up with PCP Additional Notes: Wife states that patient has been having a cough at night that is productive and some SOB. States that he is normally on home O2 at 3L and need 5 L  on yesterday. States that with his O2 on he is stating in normal range. She denies other symptoms. States that he does get relief when he lays flat.    Reason for Disposition  [1] MODERATE longstanding difficulty breathing (e.g., speaks in phrases, SOB even at rest, pulse 100-120) AND [2] SAME as normal  Answer Assessment - Initial Assessment Questions 1. RESPIRATORY STATUS: "Describe your breathing?" (e.g., wheezing, shortness of breath, unable to speak, severe coughing)      sob 2. ONSET: "When did this breathing problem begin?"      Friday  4. SEVERITY: "How bad is your breathing?" (e.g., mild, moderate, severe)    - MILD: No SOB at rest, mild SOB with walking, speaks normally in sentences, can lie down, no retractions, pulse < 100.    - MODERATE: SOB at rest, SOB with minimal exertion and prefers to sit, cannot lie down flat, speaks in phrases, mild retractions, audible wheezing, pulse 100-120.    - SEVERE: Very SOB at rest, speaks in single words, struggling to breathe, sitting hunched forward, retractions, pulse > 120      Mod-sever 5. RECURRENT SYMPTOM: "Have you had difficulty breathing before?" If Yes, ask: "When was the last time?" and "What happened that time?"      Yes Jan 6. CARDIAC HISTORY: "Do you have any history of heart disease?" (e.g., heart attack, angina, bypass surgery, angioplasty)      Yes- a flutter 7. LUNG HISTORY: "Do you have any history of lung disease?"  (e.g.,  pulmonary embolus, asthma, emphysema)     copd 8. CAUSE: "What do you think is causing the breathing problem?"      copd 9. OTHER SYMPTOMS: "Do you have any other symptoms? (e.g., dizziness, runny nose, cough, chest pain, fever) Productive cough at night 10. O2 SATURATION MONITOR:  "Do you use an oxygen saturation monitor (pulse oximeter) at home?" If Yes, ask: "What is your reading (oxygen level) today?" "What is your usual oxygen saturation reading?" (e.g., 95%)       Oxygen normally on 3L needed it on 5L yesterday.  Protocols used: Breathing Difficulty-A-AH

## 2023-10-21 NOTE — ED Provider Notes (Signed)
 [facility] Provider Note    Event Date/Time   First MD Initiated Contact with Patient 10/21/23 1231     (approximate)   History   Shortness of Breath   HPI  [name] [name]. is a 83 y.o. male past medical history significant for COPD, CHF, chronic 3 to 4 L of home oxygen, who presents to the emergency department shortness of breath.  Progressively worsening shortness of breath over the past 1 week.  States that he has been feeling very tired, short of breath and coughing that has worsened over the weekend.  Felt very tired and has a decreased p.o. intake.  Cough and congestion.  Denies any significant swelling to his legs.  Denies history of DVT or PE.  No recent surgery.  No chest pain at this time.  DNR.  Does not believe that he would want to be intubated but is still thinking about it.     Physical Exam   Triage Vital Signs: ED Triage Vitals  Encounter Vitals Group     BP 10/21/23 1209 130/87     Systolic BP Percentile --      Diastolic BP Percentile --      Pulse Rate 10/21/23 1211 91     Resp 10/21/23 1209 (!) 24     Temp 10/21/23 1209 98.4 F (36.9 C)     Temp Source 10/21/23 1209 Oral     SpO2 10/21/23 1211 100 %     Weight 10/21/23 1210 150 lb (68 kg)     Height 10/21/23 1210 5' 10.5" (1.791 m)     Head Circumference --      Peak Flow --      Pain Score 10/21/23 1210 0     Pain Loc --      Pain Education --      Exclude from Growth Chart --     Most recent vital signs: Vitals:   10/21/23 1500 10/21/23 1530  BP: 114/63 124/63  Pulse: 94 94  Resp:    Temp:    SpO2: 99% 100%    Physical Exam Constitutional:      Appearance: He is well-developed.  HENT:     Head: Atraumatic.  Eyes:     Conjunctiva/sclera: Conjunctivae normal.  Cardiovascular:     Rate and Rhythm: Regular rhythm.  Pulmonary:     Effort: Tachypnea, accessory muscle usage and respiratory distress present.     Breath sounds: Wheezing and rhonchi present.   Chest:     Chest wall: No tenderness.  Musculoskeletal:     Cervical back: Normal range of motion.     Right lower leg: Edema present.     Left lower leg: Edema present.  Skin:    General: Skin is warm.     Capillary Refill: Capillary refill takes less than 2 seconds.  Neurological:     General: No focal deficit present.     Mental Status: He is alert. Mental status is at baseline.     IMPRESSION / MDM / ASSESSMENT AND PLAN / ED COURSE  I reviewed the triage vital signs and the nursing notes.  Differential diagnosis including CHF exacerbation, COPD exacerbation, pneumonia, anemia, ACS, electrolyte abnormality, dehydration  On arrival afebrile, tachypneic, normotensive, 100% on 4 L nasal cannula.  EKG  I, Corena Herter, the attending physician, personally viewed and interpreted this ECG.  Sinus tachycardia.  Frequent PVCs.  Significant artifact.  QTc 497.  PR interval 144.  Narrow  complex.  No findings of acute ischemia.  No tachycardic or bradycardic dysrhythmias while on cardiac telemetry.  RADIOLOGY I independently reviewed imaging, my interpretation of imaging: Chest x-ray with questionable left lower lobe pneumonia.  Read as no acute findings.  LABS (all labs ordered are listed, but only abnormal results are displayed) Labs interpreted as -    Labs Reviewed  BASIC METABOLIC PANEL - Abnormal; Notable for the following components:      Result Value   Chloride 94 (*)    CO2 33 (*)    Glucose, Bld 105 (*)    Calcium 8.5 (*)    All other components within normal limits  CBC - Abnormal; Notable for the following components:   WBC 13.0 (*)    RBC 4.12 (*)    Hemoglobin 12.0 (*)    HCT 38.7 (*)    RDW 15.6 (*)    All other components within normal limits  BLOOD GAS, VENOUS - Abnormal; Notable for the following components:   pCO2, Ven 84 (*)    Bicarbonate 40.4 (*)    Acid-Base Excess 10.2 (*)    All other components within normal limits  RESP PANEL BY RT-PCR  (RSV, FLU A&B, COVID)  RVPGX2  CULTURE, BLOOD (ROUTINE X 2)  CULTURE, BLOOD (ROUTINE X 2)  EXPECTORATED SPUTUM ASSESSMENT W GRAM STAIN, RFLX TO RESP C  EXPECTORATED SPUTUM ASSESSMENT W GRAM STAIN, RFLX TO RESP C  RESPIRATORY PANEL BY PCR  LACTIC ACID, PLASMA  BRAIN NATRIURETIC PEPTIDE  LACTIC ACID, PLASMA  BLOOD GAS, VENOUS  MYCOPLASMA PNEUMONIAE ANTIBODY, IGM  LEGIONELLA PNEUMOPHILA SEROGP 1 UR AG  MAGNESIUM  BASIC METABOLIC PANEL  TROPONIN I (HIGH SENSITIVITY)     MDM    Patient with no significant leukocytosis on a normal lactic acid.  Did have significant hypercarbia that was not compensated on likely the cause of his somnolence.  Patient was placed on BiPAP.  Given DuoNeb treatments and IV Solu-Medrol.  Given IV Rocephin and doxycycline to treat COPD exacerbation.  COVID and influenza testing are negative.  Chest x-ray read as no acute findings.  Have low suspicion for pulmonary embolism.  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   PROCEDURES:  Critical Care performed: yes  .Critical Care  Performed by: Corena Herter, MD Authorized by: Corena Herter, MD   Critical care provider statement:    Critical care time (minutes):  45   Critical care time was exclusive of:  Separately billable procedures and treating other patients   Critical care was necessary to treat or prevent imminent or life-threatening deterioration of the following conditions:  Respiratory failure   Critical care was time spent personally by me on the following activities:  Development of treatment plan with patient or surrogate, discussions with consultants, evaluation of patient's response to treatment, examination of patient, ordering and review of laboratory studies, ordering and review of radiographic studies, ordering and performing treatments and interventions, pulse oximetry, re-evaluation of patient's  condition and review of old charts   Care discussed with: admitting provider     Patient's presentation is most consistent with acute presentation with potential threat to life or bodily function.   MEDICATIONS ORDERED IN ED: Medications  diltiazem (CARDIZEM CD) 24 hr capsule 120 mg (has no administration in time range)  dofetilide (TIKOSYN) capsule 250 mcg (has no administration in time range)  rosuvastatin (CRESTOR) tablet 20 mg (has no administration in time  range)  pantoprazole (PROTONIX) EC tablet 40 mg (has no administration in time range)  polyethylene glycol (MIRALAX / GLYCOLAX) packet 17 g (has no administration in time range)  senna (SENOKOT) tablet 17.2 mg (has no administration in time range)  finasteride (PROSCAR) tablet 5 mg (has no administration in time range)  apixaban (ELIQUIS) tablet 5 mg (has no administration in time range)  clonazePAM (KLONOPIN) tablet 0.5 mg (has no administration in time range)  gabapentin (NEURONTIN) capsule 200 mg (has no administration in time range)  budeson-glycopyrrolate-formoterol 160-9-4.8 MCG/ACT AERO 2 puff (has no administration in time range)  doxycycline (VIBRA-TABS) tablet 100 mg (has no administration in time range)  budesonide (PULMICORT) nebulizer solution 2 mg (has no administration in time range)  methylPREDNISolone sodium succinate (SOLU-MEDROL) 40 mg/mL injection 40 mg (has no administration in time range)    Followed by  predniSONE (DELTASONE) tablet 40 mg (has no administration in time range)  acetaminophen (TYLENOL) tablet 650 mg (has no administration in time range)    Or  acetaminophen (TYLENOL) suppository 650 mg (has no administration in time range)  furosemide (LASIX) injection 20 mg (has no administration in time range)  ipratropium (ATROVENT) nebulizer solution 0.5 mg (has no administration in time range)  levalbuterol (XOPENEX) nebulizer solution 0.63 mg (has no administration in time range)  metoprolol tartrate  (LOPRESSOR) injection 5 mg (has no administration in time range)  sodium chloride flush (NS) 0.9 % injection 3 mL (has no administration in time range)  sodium chloride flush (NS) 0.9 % injection 3 mL (has no administration in time range)  0.9 %  sodium chloride infusion (has no administration in time range)  cefTRIAXone (ROCEPHIN) 1 g in sodium chloride 0.9 % 100 mL IVPB (has no administration in time range)  ipratropium-albuterol (DUONEB) 0.5-2.5 (3) MG/3ML nebulizer solution 9 mL (9 mLs Nebulization Given 10/21/23 1309)  methylPREDNISolone sodium succinate (SOLU-MEDROL) 125 mg/2 mL injection 125 mg (125 mg Intravenous Given 10/21/23 1312)  acetaZOLAMIDE (DIAMOX) injection 250 mg (250 mg Intravenous Given 10/21/23 1511)    FINAL CLINICAL IMPRESSION(S) / ED DIAGNOSES   Final diagnoses:  COPD exacerbation (HCC)  Hypercarbia     Rx / DC Orders   ED Discharge Orders     None        Note:  This document was prepared using Dragon voice recognition software and may include unintentional dictation errors.   Corena Herter, MD 10/21/23 1600

## 2023-10-21 NOTE — H&P (Signed)
 History and Physical    [name]. PIR:[id number] DOB: 10/01/1940 DOA: 10/21/2023  PCP: [name], MD (Confirm with patient/family/NH records and if not entered, this has to be entered at [facility] point of entry) Patient coming from: Home  I have personally briefly reviewed patient's old medical records in [facility]  Chief Complaint: Cough wheezing shortness of breath  HPI: [name] [name]. is a 83 y.o. male with medical history significant of COPD Gold stage III, PAF on Tikosyn and Eliquis, chronic hypoxic respiratory failure on 3 to 4 L as needed, HTN, chronic HFpEF, CAD status post stenting in 2019, presented with worsening of cough wheezing and shortness of breath.  1 week ago, patient started to develop URI like symptoms including sore throat and runny nose and followed by worsening of wheezing and productive cough with light yellowish sputum production and exertional dyspnea.  Last 2 days symptoms getting worse, constantly feeling shortness of breath denies any chest pain no fever or chills.  ED Course: Afebrile, not tachycardia and blood pressure 130/87 O2 saturation 100% on 4 L however patient is very tachypneic and VBG showed 7.2 9/80/35, blood work showed BUN 14 creatinine 0.8, bicarb 33, WBC 13, hemoglobin 12.  Patient was given IV Solu-Medrol and breathing treatment and started on BiPAP  Review of Systems: As per HPI otherwise 14 point review of systems negative.    Past Medical History:  Diagnosis Date   Abdominal discomfort 12/07/2020   Acquired trigger finger of right middle finger 02/04/2020   Acute prostatitis 04/08/2018   Alcohol abuse    Alcohol dependence (HCC) 12/07/2020   Anal or rectal pain 04/08/2018   Annual physical exam 12/07/2020   Anorectal disorder 12/07/2020   Arthritis    Atherosclerotic heart disease of native coronary artery without angina pectoris 04/08/2018   a. 03/2018 PCI: LM nl, LAD 65m, LCX nl, OM1 70p (2.75x8 Synergy  DES), 77m (2.5x24 Synergy DES), RCA 25p, RPL1 50. EF 55-65%.   Atrial flutter (HCC)    a. 07/2022 in setting of hospitalization for resp illness/RSV. CHA2DS2VASc = 4-->eliquis; b. 08/2022 s/p DCCV (100J); c. 09/26/2022 Recurrent Aflutter.   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 02/2022 Carotid U/S: 1-39% bilat ICA stenoses.   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   Contracture of palmar fascia 12/07/2020   COPD (chronic obstructive pulmonary disease) (HCC)    Corn of toe 12/07/2020   Corns and callosities 04/09/2018   Coronary artery disease    2019 with stents   Coronary atherosclerosis 05/07/2018   Degenerative disc disease, 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   Degenerative disc disease, 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   Diarrhea 04/09/2018   Double vision with both eyes open 12/07/2020   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   Dysphagia 08/14/2018   Dyspnea    ED (erectile dysfunction)    ED (erectile dysfunction) of organic origin 12/07/2020   Elevated prostate specific antigen (PSA) 04/08/2018   Elevated PSA    Encounter for screening for other disorder 12/07/2020   Enlarged prostate without lower urinary  tract symptoms (luts) 04/09/2018   Enthesopathy 12/07/2020   Essential (primary) hypertension 04/08/2018   Ex-smoker 04/09/2018   Exertional dyspnea 04/02/2018   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 07/2022 Echo: EF 65-70%, no rwma, mild LVH, nl RV fxn.   Hyperlipidemia    Hypertension    Hypoxemia 12/07/2020   Kidney cysts    Left knee pain 12/07/2020   Low back pain 04/08/2018   Male erectile dysfunction 04/09/2018   Mixed hyperlipidemia 04/08/2018   Nocturia more than twice per night 04/09/2018   Osteoarthritis of first carpometacarpal joint 04/08/2018   Osteoarthritis of knee 12/07/2020   Other long term (current) drug therapy 12/07/2020   Pain due to onychomycosis of toenail of left foot 07/20/2019   Pain in joint 04/09/2018   Pain in knee 04/09/2018   Pain in unspecified knee 12/07/2020   Pain of finger 04/09/2018   Palpitations    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HCC) 12/07/2020   Personal history of colonic polyps 12/07/2020   Pneumonia    Pneumothorax 04/12/2020   Polypharmacy 04/09/2018   Porokeratosis 01/13/2019   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HCC)    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   Pulmonary nodules 06/08/2016   Pure hypercholesterolemia 12/07/2020   Renal cyst 12/07/2020   Sleep disorder 04/08/2018   Status post bronchoscopy 04/12/2020   Tear of rotator cuff 04/09/2018   Tobacco user 12/07/2020   Uncomplicated alcohol dependence (HCC) 04/09/2018   Unspecified rotator cuff tear or rupture of unspecified shoulder, not specified as traumatic 12/07/2020   Unspecified tear of unspecified meniscus, current injury, unspecified knee, initial encounter 12/07/2020   Visual disturbance 12/07/2020   Vitamin D deficiency 04/08/2018    Past Surgical History:  Procedure Laterality Date   BRONCHIAL BIOPSY  10/13/2019   Procedure: BRONCHIAL BIOPSIES;  Surgeon: Leslye Peer, MD;  Location: [facility] ENDOSCOPY;  Service: Pulmonary;;   BRONCHIAL BIOPSY  04/12/2020   Procedure: BRONCHIAL BIOPSIES;  Surgeon: Leslye Peer, MD;  Location: [facility] ENDOSCOPY;  Service: Pulmonary;;   BRONCHIAL BRUSHINGS  10/13/2019    Procedure: BRONCHIAL BRUSHINGS;  Surgeon: Leslye Peer, MD;  Location: [facility] ENDOSCOPY;  Service: Pulmonary;;   BRONCHIAL BRUSHINGS  04/12/2020   Procedure: BRONCHIAL BRUSHINGS;  Surgeon: Leslye Peer, MD;  Location: [facility] ENDOSCOPY;  Service: Pulmonary;;   BRONCHIAL NEEDLE ASPIRATION BIOPSY  10/13/2019   Procedure: BRONCHIAL NEEDLE ASPIRATION BIOPSIES;  Surgeon: Leslye Peer, MD;  Location: [facility] ENDOSCOPY;  Service: Pulmonary;;   BRONCHIAL NEEDLE ASPIRATION BIOPSY  04/12/2020   Procedure: BRONCHIAL NEEDLE ASPIRATION BIOPSIES;  Surgeon: Leslye Peer, MD;  Location: [facility] ENDOSCOPY;  Service: Pulmonary;;   BRONCHIAL WASHINGS  10/13/2019   Procedure: BRONCHIAL WASHINGS;  Surgeon: Leslye Peer, MD;  Location: [facility] ENDOSCOPY;  Service: Pulmonary;;   BRONCHIAL WASHINGS  04/12/2020   Procedure: BRONCHIAL WASHINGS;  Surgeon: Leslye Peer, MD;  Location: [facility] ENDOSCOPY;  Service: Pulmonary;;   CARDIAC CATHETERIZATION  2002   50% RCA   CARDIOVERSION N/A 09/17/2022   Procedure: CARDIOVERSION;  Surgeon: Iran Ouch, MD;  Location: [facility] ORS;  Service: Cardiovascular;  Laterality: N/A;   CATARACT EXTRACTION, BILATERAL     CORONARY STENT INTERVENTION N/A 04/15/2018   Procedure: CORONARY STENT INTERVENTION;  Surgeon: Corky Crafts, MD;  Location: [facility] INVASIVE CV LAB;  Service:  Cardiovascular;  Laterality: N/A;  om1   EYE SURGERY     KNEE ARTHROSCOPY     LEFT HEART CATH AND CORONARY ANGIOGRAPHY N/A 04/15/2018   Procedure: LEFT HEART CATH AND CORONARY ANGIOGRAPHY;  Surgeon: Corky Crafts, MD;  Location: [facility] INVASIVE CV LAB;  Service: Cardiovascular;  Laterality: N/A;   MULTIPLE TOOTH EXTRACTIONS     TONSILLECTOMY     VIDEO BRONCHOSCOPY  04/12/2020   VIDEO BRONCHOSCOPY WITH ENDOBRONCHIAL NAVIGATION N/A 07/26/2016   Procedure: VIDEO BRONCHOSCOPY WITH ENDOBRONCHIAL NAVIGATION;  Surgeon: Leslye Peer, MD;  Location: [facility] OR;  Service: Thoracic;  Laterality: N/A;   VIDEO BRONCHOSCOPY WITH  ENDOBRONCHIAL NAVIGATION N/A 10/13/2019   Procedure: [facility] AND VIDEO BRONCHOSCOPY WITH ENDOBRONCHIAL NAVIGATION;  Surgeon: Leslye Peer, MD;  Location: [facility] ENDOSCOPY;  Service: Pulmonary;  Laterality: N/A;   VIDEO BRONCHOSCOPY WITH ENDOBRONCHIAL NAVIGATION N/A 04/12/2020   Procedure: VIDEO BRONCHOSCOPY WITH ENDOBRONCHIAL NAVIGATION;  Surgeon: Leslye Peer, MD;  Location: [facility] ENDOSCOPY;  Service: Pulmonary;  Laterality: N/A;     reports that he quit smoking about 3 years ago. His smoking use included cigarettes. He started smoking about 71 years ago. He has a 51 pack-year smoking history. He has never been exposed to tobacco smoke. He has never used smokeless tobacco. He reports that he does not drink alcohol and does not use drugs.  Allergies  Allergen Reactions   Flagyl [Metronidazole] Other (See Comments)    Other reaction(s): skin reaction    Family History  Problem Relation Age of Onset   Hypertension Mother    Alzheimer's disease Mother     Prior to Admission medications   Medication Sig Start Date End Date Taking? Authorizing Provider  acetaminophen (TYLENOL) 325 MG tablet Take 650 mg by mouth every 4 (four) hours as needed.    [provider]  albuterol (VENTOLIN HFA) 108 (90 Base) MCG/ACT inhaler INHALE 2 PUFFS 2-3 TIMES A DAY AS NEEDED FOR WHEEZING (30 DAYS) 30 DAY(S) 07/01/23   [name], MD  apixaban (ELIQUIS) 5 MG TABS tablet Take 1 tablet (5 mg total) by mouth 2 (two) times daily. 08/01/23   [name], MD  Budeson-Glycopyrrol-Formoterol (BREZTRI AEROSPHERE) 160-9-4.8 MCG/ACT AERO Inhale 2 puffs into the lungs 2 (two) times daily. 08/01/23   [name], MD  clonazePAM (KLONOPIN) 0.5 MG tablet TAKE 1 TABLET BY MOUTH EVERYDAY AT BEDTIME 10/02/23   Tillman Abide I, MD  diltiazem (CARDIZEM CD) 120 MG 24 hr capsule TAKE 1 CAPSULE BY MOUTH 2 TIMES DAILY. 09/03/23   Furth, Cadence H, PA-C  dofetilide (TIKOSYN) 250 MCG capsule Take 1 capsule (250 mcg total)  by mouth 2 (two) times daily. 03/21/23   Sherie Don, NP  finasteride (PROSCAR) 5 MG tablet Take 5 mg by mouth daily.    [provider]  furosemide (LASIX) 20 MG tablet Give Alternating Dose of 20mg /40mg  by mouth once daily.    [provider]  gabapentin (NEURONTIN) 100 MG capsule Take 200 mg by mouth at bedtime. 07/26/21   [provider]  ipratropium-albuterol (DUONEB) 0.5-2.5 (3) MG/3ML SOLN INHALE 3 ML BY NEBULIZER EVERY 6 HOURS AS NEEDED 11/29/22   Leslye Peer, MD  morphine (ROXANOL) 20 MG/ML concentrated solution Take 0.25 mLs (5 mg total) by mouth daily as needed. 09/02/23   Earnestine Mealing, MD  nitroGLYCERIN (NITROSTAT) 0.4 MG SL tablet Place 1 tablet (0.4 mg total) under the tongue every 5 (five) minutes as needed for chest pain. 07/13/21  Revankar, Aundra Dubin, MD  OXYGEN Inhale into the lungs. 3-4 liters upon exertion and 3 liters continuous at night.    [provider]  pantoprazole (PROTONIX) 40 MG tablet Take 1 tablet (40 mg total) by mouth daily. 03/13/23   [name], MD  polyethylene glycol (MIRALAX / GLYCOLAX) 17 g packet Take 17 g by mouth daily as needed for moderate constipation.    [provider]  rosuvastatin (CRESTOR) 20 MG tablet Take 1 tablet (20 mg total) by mouth daily. 04/16/23   Sherie Don, NP  senna (SENOKOT) 8.6 MG TABS tablet Take 2 tablets by mouth at bedtime.    [provider]  silver sulfADIAZINE (SILVADENE) 1 % cream Apply 1 Application topically daily. 09/20/22   Tillman Abide I, MD  sodium chloride HYPERTONIC 3 % nebulizer solution Take by nebulization in the morning and at bedtime. 06/26/23 06/25/24  Raechel Chute, MD    Physical Exam: Vitals:   10/21/23 1209 10/21/23 1210 10/21/23 1211  BP: 130/87    Pulse:   91  Resp: (!) 24    Temp: 98.4 F (36.9 C)    TempSrc: Oral    SpO2:   100%  Weight:  68 kg   Height:  5' 10.5" (1.791 m)     Constitutional: NAD, calm,  comfortable Vitals:   10/21/23 1209 10/21/23 1210 10/21/23 1211  BP: 130/87    Pulse:   91  Resp: (!) 24    Temp: 98.4 F (36.9 C)    TempSrc: Oral    SpO2:   100%  Weight:  68 kg   Height:  5' 10.5" (1.791 m)    Eyes: PERRL, lids and conjunctivae normal ENMT: Mucous membranes are moist. Posterior pharynx clear of any exudate or lesions.Normal dentition.  Neck: normal, supple, no masses, no thyromegaly Respiratory: Diminished breathing sound bilaterally, diffused crackles and wheezing bilaterally, increasing respiratory effort. No accessory muscle use.  Cardiovascular: Regular rate and rhythm, no murmurs / rubs / gallops. 1+ extremity edema. 2+ pedal pulses. No carotid bruits.  Abdomen: no tenderness, no masses palpated. No hepatosplenomegaly. Bowel sounds positive.  Musculoskeletal: no clubbing / cyanosis. No joint deformity upper and lower extremities. Good ROM, no contractures. Normal muscle tone.  Skin: no rashes, lesions, ulcers. No induration Neurologic: CN 2-12 grossly intact. Sensation intact, DTR normal. Strength 5/5 in all 4.  Psychiatric: Normal judgment and insight. Alert and oriented x 3. Normal mood.     Labs on Admission: I have personally reviewed following labs and imaging studies  CBC: Recent Labs  Lab 10/21/23 1212  WBC 13.0*  HGB 12.0*  HCT 38.7*  MCV 93.9  PLT 306   Basic Metabolic Panel: Recent Labs  Lab 10/21/23 1212  NA 138  K 3.7  CL 94*  CO2 33*  GLUCOSE 105*  BUN 14  CREATININE 0.86  CALCIUM 8.5*   GFR: Estimated Creatinine Clearance: 63.7 mL/min (by C-G formula based on SCr of 0.86 mg/dL). Liver Function Tests: No results for input(s): "AST", "ALT", "ALKPHOS", "BILITOT", "PROT", "ALBUMIN" in the last 168 hours. No results for input(s): "LIPASE", "AMYLASE" in the last 168 hours. No results for input(s): "AMMONIA" in the last 168 hours. Coagulation Profile: No results for input(s): "INR", "PROTIME" in the last 168 hours. Cardiac  Enzymes: No results for input(s): "CKTOTAL", "CKMB", "CKMBINDEX", "TROPONINI" in the last 168 hours. BNP (last 3 results) No results for input(s): "PROBNP" in the last 8760 hours. HbA1C: No results for input(s): "HGBA1C" in the  last 72 hours. CBG: No results for input(s): "GLUCAP" in the last 168 hours. Lipid Profile: No results for input(s): "CHOL", "HDL", "LDLCALC", "TRIG", "CHOLHDL", "LDLDIRECT" in the last 72 hours. Thyroid Function Tests: No results for input(s): "TSH", "T4TOTAL", "FREET4", "T3FREE", "THYROIDAB" in the last 72 hours. Anemia Panel: No results for input(s): "VITAMINB12", "FOLATE", "FERRITIN", "TIBC", "IRON", "RETICCTPCT" in the last 72 hours. Urine analysis:    Component Value Date/Time   COLORURINE YELLOW 11/16/2009 1130   APPEARANCEUR CLEAR 11/16/2009 1130   LABSPEC 1.017 11/16/2009 1130   PHURINE 7.5 11/16/2009 1130   GLUCOSEU NEGATIVE 11/16/2009 1130   HGBUR NEGATIVE 11/16/2009 1130   BILIRUBINUR NEGATIVE 11/16/2009 1130   KETONESUR NEGATIVE 11/16/2009 1130   PROTEINUR NEGATIVE 11/16/2009 1130   UROBILINOGEN 0.2 11/16/2009 1130   NITRITE NEGATIVE 11/16/2009 1130   LEUKOCYTESUR  11/16/2009 1130    NEGATIVE MICROSCOPIC NOT DONE ON URINES WITH NEGATIVE PROTEIN, BLOOD, LEUKOCYTES, NITRITE, OR GLUCOSE <1000 mg/dL.    Radiological Exams on Admission: DG Chest Port 1 View Result Date: 10/21/2023 CLINICAL DATA:  [id number] COPD (chronic obstructive pulmonary disease) (HCC) [phone] EXAM: PORTABLE CHEST 1 VIEW COMPARISON:  08/23/2023, 05/31/2023 FINDINGS: The heart size and mediastinal contours are within normal limits. Hyperinflated lungs with chronically coarsened interstitial markings bilaterally. No new focal airspace consolidation. No pleural effusion or pneumothorax. The visualized skeletal structures are unremarkable. IMPRESSION: Chronic lung changes without acute cardiopulmonary findings. Electronically Signed   By: Duanne Guess D.O.   On: 10/21/2023 14:25     EKG: Independently reviewed.  A-fib, borderline tachycardia, no acute ST changes.  Assessment/Plan Principal Problem:   COPD (chronic obstructive pulmonary disease) (HCC) Active Problems:   Acute on chronic respiratory failure with hypoxia (HCC)   COPD with acute exacerbation (HCC)  (please populate well all problems here in Problem List. (For example, if patient is on BP meds at home and you resume or decide to hold them, it is a problem that needs to be her. Same for CAD, COPD, HLD and so on)  Acute on chronic hypoxic and hypercapnic respiratory failure Acute COPD exacerbation Decompensated chronic combined respiratory acidosis and metabolic alkalosis -Continue BiPAP support -Given there is a increasing of sputum production and color changes, we will start patient on doxycycline -IV Solu-Medrol -ICS and LABA -Atrovent and as needed Xopenex -Culture sputum -Respiratory panel -Diamox x 1  Acute on chronic HFpEF decompensation -Continue IV diuresis -Recheck chest x-ray tomorrow -Echocardiogram was done recently, will not repeat echo at this time  PAF with question of RVR -EKG showed borderline RVR, telemonitoring showed few episode of breakthrough heart rate -Continue Cardizem and Tikosyn -PRN Lopressor for breakthrough rate -Continue Eliquis  Deconditioning -PT evaluation   DVT prophylaxis: Eliquis Code Status: DNR Family Communication: Wife at bedside Disposition Plan: Patient sick with acute COPD exacerbation and acute CHF pigmentation, requiring IV diuresis IV Solu-Medrol and BiPAP support, expect more than 2 midnight hospital stay Consults called: None Admission status: PCU admit   Emeline General MD [facility] Pager [phone]  10/21/2023, 3:19 PM

## 2023-10-22 DIAGNOSIS — J432 Centrilobular emphysema: Secondary | ICD-10-CM | POA: Diagnosis not present

## 2023-10-22 LAB — RESPIRATORY PANEL BY PCR

## 2023-10-22 LAB — CBC
HCT: 34.6 % — ABNORMAL LOW (ref 39.0–52.0)
Hemoglobin: 11.1 g/dL — ABNORMAL LOW (ref 13.0–17.0)
MCH: 28.9 pg (ref 26.0–34.0)
MCHC: 32.1 g/dL (ref 30.0–36.0)
MCV: 90.1 fL (ref 80.0–100.0)
Platelets: 302 10*3/uL (ref 150–400)
RBC: 3.84 MIL/uL — ABNORMAL LOW (ref 4.22–5.81)
RDW: 15.2 % (ref 11.5–15.5)
WBC: 4.3 10*3/uL (ref 4.0–10.5)
nRBC: 0 % (ref 0.0–0.2)

## 2023-10-22 LAB — BASIC METABOLIC PANEL
Anion gap: 13 (ref 5–15)
BUN: 21 mg/dL (ref 8–23)
CO2: 31 mmol/L (ref 22–32)
Calcium: 8.7 mg/dL — ABNORMAL LOW (ref 8.9–10.3)
Chloride: 95 mmol/L — ABNORMAL LOW (ref 98–111)
Creatinine, Ser: 0.89 mg/dL (ref 0.61–1.24)
GFR, Estimated: >60 mL/min (ref >60)
Glucose, Bld: 145 mg/dL — ABNORMAL HIGH (ref 70–99)
Potassium: 4.1 mmol/L (ref 3.5–5.1)
Sodium: 139 mmol/L (ref 135–145)

## 2023-10-22 LAB — MAGNESIUM: Magnesium: 2.3 mg/dL (ref 1.7–2.4)

## 2023-10-22 MED ORDER — FLUTICASONE PROPIONATE 50 MCG/ACT NA SUSP
2.0000 | Freq: Every day | NASAL | Status: DC
  Administered 2023-10-22 – 2023-10-28 (×6): 2 via NASAL
  Filled 2023-10-22: qty 16

## 2023-10-22 MED ORDER — FUROSEMIDE 20 MG PO TABS
20.0000 mg | ORAL_TABLET | Freq: Every day | ORAL | Status: DC
  Administered 2023-10-23 – 2023-10-24 (×2): 20 mg via ORAL
  Filled 2023-10-22 (×2): qty 1

## 2023-10-22 MED ORDER — ZINC OXIDE 40 % EX OINT
TOPICAL_OINTMENT | Freq: Two times a day (BID) | CUTANEOUS | Status: DC
  Filled 2023-10-22 (×2): qty 113

## 2023-10-22 MED ORDER — OXYCODONE HCL 5 MG PO TABS
5.0000 mg | ORAL_TABLET | Freq: Four times a day (QID) | ORAL | Status: DC | PRN
  Administered 2023-10-22: 10 mg via ORAL
  Filled 2023-10-22 (×2): qty 2

## 2023-10-22 MED ORDER — GUAIFENESIN ER 600 MG PO TB12
1200.0000 mg | ORAL_TABLET | Freq: Two times a day (BID) | ORAL | Status: DC
  Administered 2023-10-22 (×2): 1200 mg via ORAL
  Administered 2023-10-23: 600 mg via ORAL
  Administered 2023-10-23 – 2023-10-29 (×12): 1200 mg via ORAL
  Filled 2023-10-22 (×15): qty 2

## 2023-10-22 NOTE — Plan of Care (Signed)
  Problem: Education: Goal: Knowledge of General Education information will improve Description: Including pain rating scale, medication(s)/side effects and non-pharmacologic comfort measures Outcome: Progressing   Problem: Health Behavior/Discharge Planning: Goal: Ability to manage health-related needs will improve Outcome: Progressing   Problem: Clinical Measurements: Goal: Ability to maintain clinical measurements within normal limits will improve Outcome: Progressing Goal: Will remain free from infection Outcome: Progressing Goal: Diagnostic test results will improve Outcome: Progressing Goal: Respiratory complications will improve Outcome: Progressing Goal: Cardiovascular complication will be avoided Outcome: Progressing   Problem: Activity: Goal: Risk for activity intolerance will decrease Outcome: Progressing   Problem: Nutrition: Goal: Adequate nutrition will be maintained Outcome: Progressing   Problem: Coping: Goal: Level of anxiety will decrease Outcome: Progressing   Problem: Elimination: Goal: Will not experience complications related to bowel motility Outcome: Progressing Goal: Will not experience complications related to urinary retention Outcome: Progressing   Problem: Pain Managment: Goal: General experience of comfort will improve and/or be controlled Outcome: Progressing   Problem: Safety: Goal: Ability to remain free from injury will improve Outcome: Progressing   Problem: Skin Integrity: Goal: Risk for impaired skin integrity will decrease Outcome: Progressing   Problem: Education: Goal: Knowledge of disease or condition will improve Outcome: Progressing Goal: Knowledge of the prescribed therapeutic regimen will improve Outcome: Progressing Goal: Individualized Educational Video(s) Outcome: Progressing   Problem: Activity: Goal: Ability to tolerate increased activity will improve Outcome: Progressing Goal: Will verbalize the  importance of balancing activity with adequate rest periods Outcome: Progressing   Problem: Respiratory: Goal: Ability to maintain a clear airway will improve Outcome: Progressing Goal: Levels of oxygenation will improve Outcome: Progressing Goal: Ability to maintain adequate ventilation will improve Outcome: Progressing   Problem: Education: Goal: Knowledge of disease or condition will improve Outcome: Progressing Goal: Understanding of medication regimen will improve Outcome: Progressing Goal: Individualized Educational Video(s) Outcome: Progressing   Problem: Activity: Goal: Ability to tolerate increased activity will improve Outcome: Progressing   Problem: Cardiac: Goal: Ability to achieve and maintain adequate cardiopulmonary perfusion will improve Outcome: Progressing   Problem: Health Behavior/Discharge Planning: Goal: Ability to safely manage health-related needs after discharge will improve Outcome: Progressing

## 2023-10-22 NOTE — Progress Notes (Signed)
 Physical Therapy Treatment Patient Details Name: [name] [name]. MRN: [id number] DOB: [birth year] Today's Date: 10/22/2023   History of Present Illness 83 y/o male presented to ED on 10/21/23 for SOB and wheezing. Admitted for acute on chronic hypoxic and hypercapnic respiratory failure. 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 PTA, patient lives with wife at [facility] and reports he self propels himself in a transport chair around the house and uses rollator when he attends PT 3x/week and gym 2-3x/week. Requires 3L O2 at rest and 4L with activity at baseline. Currently, patient requires 4L O2 to maintain spO2 >90% with minimal activity and increased WOB. Required CGA for bed mobility and sit to stand. Took sidesteps at EOB with minA due to posterior LOB x 2. Patient will benefit from skilled PT services during acute stay to address listed deficits. Patient will benefit from ongoing therapy at discharge to maximize functional independence and safety.     If plan is discharge home, recommend the following: A lot of help with walking and/or transfers;A lot of help with bathing/dressing/bathroom;Assistance with cooking/housework;Assist for transportation;Help with stairs or ramp for entrance   Can travel by private vehicle        Equipment Recommendations  None recommended by PT    Recommendations for Other Services       Precautions / Restrictions Precautions Precautions: Fall Recall of Precautions/Restrictions: Intact Precaution/Restrictions Comments: O2 Restrictions Weight Bearing Restrictions Per Provider Order: No     Mobility  Bed Mobility Overal bed mobility: Needs Assistance Bed Mobility: Supine to Sit, Sit to Supine     Supine to sit: Contact guard Sit to supine: Contact guard assist   General bed mobility comments: increased time to complete    Transfers Overall transfer level: Needs  assistance Equipment used: None Transfers: Sit to/from Stand Sit to Stand: Contact guard assist                Ambulation/Gait Ambulation/Gait assistance: Min assist Gait Distance (Feet): 3 Feet Assistive device: None   Gait velocity: decreased     General Gait Details: sidestepping towards HOB with posterior LOB x 2 requiring minA   Stairs             Wheelchair Mobility     Tilt Bed    Modified Rankin (Stroke Patients Only)       Balance Overall balance assessment: Needs assistance Sitting-balance support: No upper extremity supported, Feet supported Sitting balance-[name]: Fair     Standing balance support: No upper extremity supported, During functional activity Standing balance-[name]: Poor                              Communication Communication Communication: No apparent difficulties  Cognition Arousal: Alert Behavior During Therapy: WFL for tasks assessed/performed   PT - Cognitive impairments: No apparent impairments                         Following commands: Intact      Cueing    Exercises      General Comments General comments (skin integrity, edema, etc.): SpO2 dropped to 90% on 4L with minimal activity      Pertinent Vitals/Pain Pain Assessment Pain Assessment: No/denies pain    Home Living Family/patient expects to be discharged to:: Private residence  Living Arrangements: Spouse/significant other Available Help at Discharge: Family;Available PRN/intermittently Type of Home: Independent living facility Home Access: Level entry       Home Layout: One level Home Equipment: Rollator (4 wheels);Shower seat;Transport chair;Electric scooter Additional Comments: Agricultural consultant.    Prior Function            PT Goals (current goals can now be found in the care plan section) Acute Rehab PT Goals Patient Stated Goal: to go home PT Goal Formulation: With patient Time For Goal  Achievement: 11/05/23 Potential to Achieve Goals: Fair    Frequency    Min 2X/week      PT Plan      Co-evaluation              AM-PAC PT "6 Clicks" Mobility   Outcome Measure  Help needed turning from your back to your side while in a flat bed without using bedrails?: A Little Help needed moving from lying on your back to sitting on the side of a flat bed without using bedrails?: A Little Help needed moving to and from a bed to a chair (including a wheelchair)?: A Little Help needed standing up from a chair using your arms (e.g., wheelchair or bedside chair)?: A Little Help needed to walk in hospital room?: A Lot Help needed climbing 3-5 steps with a railing? : Total 6 Click Score: 15    End of Session Equipment Utilized During Treatment: Oxygen Activity Tolerance: Patient limited by fatigue Patient left: in bed;with call bell/phone within reach;with bed alarm set Nurse Communication: Mobility status PT Visit Diagnosis: Muscle weakness (generalized) (M62.81);Unsteadiness on feet (R26.81);Other abnormalities of gait and mobility (R26.89)     Time: 0912-0932 PT Time Calculation (min) (ACUTE ONLY): 20 min  Charges:      PT General Charges $$ ACUTE PT VISIT: 1 Visit                     [name] [name], PT, DPT Physical Therapist - [facility]  [facility]    [name] [name] 10/22/2023, 11:57 AM

## 2023-10-22 NOTE — Progress Notes (Signed)
 PROGRESS NOTE    [name] [name].   WGN:[id number] DOB: [birth year]  DOA: 10/21/2023 Date of Service: 10/22/23 which is hospital day 1  PCP: [name], MD    Hospital course / significant events:   HPI: [name] [name]. is a 83 y.o. 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 Worse past 2 days   03/24: admitted to hospitalist service for COPD exacerbation Requiring BiPAP in ED.  03/25: still very SOB, continue tx      Consultants:  none  Procedures/Surgeries: none      ASSESSMENT & PLAN:   Brezrtri   Acute on chronic hypoxic and hypercapnic respiratory failure Acute COPD exacerbation Decompensated chronic combined respiratory acidosis and metabolic alkalosis Continue BiPAP support prn O2 prn  Doxycycline Steroids - IV solumedrol --> po prednisone  Continue home Breztri  Duoneb q6h  Xopenex neb q6h prn  Add pulmicort neb scheduled bid  Mucinex  Incentive spirometry    Acute on chronic HFpEF without decompensation S/p IV diuresis  I&O Continue home furosmide po  Echocardiogram was done recently, will not repeat echo at this time   PAF with question of RVR EKG showed borderline RVR, telemonitoring showed few episode of breakthrough heart rate, now resolved  Continue home Cardizem and Tikosyn PRN Lopressor for breakthrough rate Continue Eliquis   Deconditioning PT/OT evaluation  GERD PPI   No concerns based on BMI: Body mass index is 21.22 kg/m.  Underweight - under 18  overweight - 25 to 29 obese - 30 or more Class 1 obesity: BMI of 30.0 to 34 Class 2 obesity: BMI of 35.0 to 39 Class 3 obesity: BMI of 40.0 to 49 Super Morbid Obesity: BMI 50-59 Super-super Morbid Obesity: BMI 60+ Significantly low or  high BMI is associated with higher medical risk.  Weight management advised as adjunct to other disease management and risk reduction treatments    DVT prophylaxis: eliquis  IV fluids: no continuous IV fluids  Nutrition: cardiac diet Central lines / other devices: none  Code Status: DNR ACP documentation reviewed:  DNR and MOST on file in VYNCA  TOC needs: TBD Medical barriers to dispo: increased O2 requirement SOB and decreased functional capacity prohibits safe discharge at this time. Expected medical readiness for discharge next few days .              Subjective / Brief ROS:  Patient reports SOB, coughing Denies CP Pain in ankles/feet, reports plantar fasciitis Denies new weakness.  Tolerating diet.  Reports no concerns w/ urination/defecation.   Family Communication: wife at bedside on rounds     Objective Findings:  Vitals:   10/21/23 2200 10/22/23 0500 10/22/23 0829 10/22/23 1200  BP:   133/67 116/60  Pulse:   84 82  Resp:   19 17  Temp: 97.7 F (36.5 C) 98.7 F (37.1 C) 97.8 F (36.6 C) 98 F (36.7 C)  TempSrc: Oral Oral Oral Oral  SpO2:   100% 100%  Weight:      Height:        Intake/Output Summary (Last 24 hours) at 10/22/2023 1512 Last data filed at 10/22/2023 1041 Gross per 24 hour  Intake 100 ml  Output 1400 ml  Net -1300 ml   Filed Weights   10/21/23  1210  Weight: 68 kg    Examination:  Physical Exam Constitutional:      General: He is not in acute distress. Cardiovascular:     Rate and Rhythm: Normal rate and regular rhythm.  Pulmonary:     Breath sounds: Decreased breath sounds, wheezing and rhonchi (coarse breath sounds scattered) present.  Abdominal:     General: Bowel sounds are normal.     Palpations: Abdomen is soft.  Musculoskeletal:     Right lower leg: Edema present.     Left lower leg: Edema present.  Neurological:     General: No focal deficit present.     Mental Status: He is alert and oriented to person,  place, and time.  Psychiatric:        Mood and Affect: Mood normal.        Behavior: Behavior normal.          Scheduled Medications:   apixaban  5 mg Oral BID   budeson-glycopyrrolate-formoterol  2 puff Inhalation BID   budesonide (PULMICORT) nebulizer solution  2 mg Nebulization Q12H   clonazePAM  0.5 mg Oral QHS   diltiazem  120 mg Oral BID   dofetilide  250 mcg Oral BID   doxycycline  100 mg Oral Q12H   finasteride  5 mg Oral Daily   [START ON 10/23/2023] furosemide  20 mg Oral Daily   gabapentin  200 mg Oral QHS   guaiFENesin  1,200 mg Oral BID   ipratropium  0.5 mg Nebulization Q6H   liver oil-zinc oxide   Topical BID   pantoprazole  40 mg Oral Daily   [START ON 10/23/2023] predniSONE  40 mg Oral Q breakfast   rosuvastatin  20 mg Oral QHS   senna  2 tablet Oral QHS   sodium chloride flush  3 mL Intravenous Q12H    Continuous Infusions:  sodium chloride      PRN Medications:  sodium chloride, acetaminophen **OR** acetaminophen, levalbuterol, metoprolol tartrate, oxyCODONE, polyethylene glycol, sodium chloride flush  Antimicrobials from admission:  Anti-infectives (From admission, onward)    Start     Dose/Rate Route Frequency Ordered Stop   10/21/23 1600  cefTRIAXone (ROCEPHIN) 1 g in sodium chloride 0.9 % 100 mL IVPB        1 g 200 mL/hr over 30 Minutes Intravenous  Once 10/21/23 1557 10/21/23 1707   10/21/23 1530  doxycycline (VIBRA-TABS) tablet 100 mg        100 mg Oral Every 12 hours 10/21/23 1515 10/26/23 0959           Data Reviewed:  I have personally reviewed the following...  CBC: Recent Labs  Lab 10/21/23 1212 10/22/23 0713  WBC 13.0* 4.3  HGB 12.0* 11.1*  HCT 38.7* 34.6*  MCV 93.9 90.1  PLT 306 302   Basic Metabolic Panel: Recent Labs  Lab 10/21/23 1212 10/21/23 2235 10/22/23 0713  NA 138 136 139  K 3.7 3.8 4.1  CL 94* 91* 95*  CO2 33* 32 31  GLUCOSE 105* 178* 145*  BUN 14 19 21   CREATININE 0.86 0.90 0.89  CALCIUM 8.5*  8.3* 8.7*  MG 1.9  --  2.3   GFR: Estimated Creatinine Clearance: 61.5 mL/min (by C-G formula based on SCr of 0.89 mg/dL). Liver Function Tests: No results for input(s): "AST", "ALT", "ALKPHOS", "BILITOT", "PROT", "ALBUMIN" in the last 168 hours. No results for input(s): "LIPASE", "AMYLASE" in the last 168 hours. No results for input(s): "AMMONIA" in the last 168  hours. Coagulation Profile: No results for input(s): "INR", "PROTIME" in the last 168 hours. Cardiac Enzymes: No results for input(s): "CKTOTAL", "CKMB", "CKMBINDEX", "TROPONINI" in the last 168 hours. BNP (last 3 results) No results for input(s): "PROBNP" in the last 8760 hours. HbA1C: No results for input(s): "HGBA1C" in the last 72 hours. CBG: No results for input(s): "GLUCAP" in the last 168 hours. Lipid Profile: No results for input(s): "CHOL", "HDL", "LDLCALC", "TRIG", "CHOLHDL", "LDLDIRECT" in the last 72 hours. Thyroid Function Tests: No results for input(s): "TSH", "T4TOTAL", "FREET4", "T3FREE", "THYROIDAB" in the last 72 hours. Anemia Panel: No results for input(s): "VITAMINB12", "FOLATE", "FERRITIN", "TIBC", "IRON", "RETICCTPCT" in the last 72 hours. Most Recent Urinalysis On File:     Component Value Date/Time   COLORURINE YELLOW 11/16/2009 1130   APPEARANCEUR CLEAR 11/16/2009 1130   LABSPEC 1.017 11/16/2009 1130   PHURINE 7.5 11/16/2009 1130   GLUCOSEU NEGATIVE 11/16/2009 1130   HGBUR NEGATIVE 11/16/2009 1130   BILIRUBINUR NEGATIVE 11/16/2009 1130   KETONESUR NEGATIVE 11/16/2009 1130   PROTEINUR NEGATIVE 11/16/2009 1130   UROBILINOGEN 0.2 11/16/2009 1130   NITRITE NEGATIVE 11/16/2009 1130   LEUKOCYTESUR  11/16/2009 1130    NEGATIVE MICROSCOPIC NOT DONE ON URINES WITH NEGATIVE PROTEIN, BLOOD, LEUKOCYTES, NITRITE, OR GLUCOSE <1000 mg/dL.   Sepsis Labs: @LABRCNTIP (procalcitonin:4,lacticidven:4) Microbiology: Recent Results (from the past 240 hours)  Resp panel by RT-PCR (RSV, Flu A&B, Covid) Anterior  Nasal Swab     Status: None   Collection Time: 10/21/23  1:26 PM   Specimen: Anterior Nasal Swab  Result Value Ref Range Status   SARS Coronavirus 2 by RT PCR NEGATIVE NEGATIVE Final    Comment: (NOTE) SARS-CoV-2 target nucleic acids are NOT DETECTED.  The SARS-CoV-2 RNA is generally detectable in upper respiratory specimens during the acute phase of infection. The lowest concentration of SARS-CoV-2 viral copies this assay can detect is 138 copies/mL. A negative result does not preclude SARS-Cov-2 infection and should not be used as the sole basis for treatment or other patient management decisions. A negative result may occur with  improper specimen collection/handling, submission of specimen other than nasopharyngeal swab, presence of viral mutation(s) within the areas targeted by this assay, and inadequate number of viral copies(<138 copies/mL). A negative result must be combined with clinical observations, patient history, and epidemiological information. The expected result is Negative.  Fact Sheet for Patients:  BloggerCourse.com  Fact Sheet for Healthcare Providers:  SeriousBroker.it  This test is no t yet approved or cleared by the [place] FDA and  has been authorized for detection and/or diagnosis of SARS-CoV-2 by FDA under an Emergency Use Authorization (EUA). This EUA will remain  in effect (meaning this test can be used) for the duration of the COVID-19 declaration under Section 564(b)(1) of the Act, 21 U.S.C.section 360bbb-3(b)(1), unless the authorization is terminated  or revoked sooner.       Influenza A by PCR NEGATIVE NEGATIVE Final   Influenza B by PCR NEGATIVE NEGATIVE Final    Comment: (NOTE) The Xpert Xpress SARS-CoV-2/FLU/RSV plus assay is intended as an aid in the diagnosis of influenza from Nasopharyngeal swab specimens and should not be used as a sole basis for treatment. Nasal washings  and aspirates are unacceptable for Xpert Xpress SARS-CoV-2/FLU/RSV testing.  Fact Sheet for Patients: BloggerCourse.com  Fact Sheet for Healthcare Providers: SeriousBroker.it  This test is not yet approved or cleared by the [place] FDA and has been authorized for detection and/or diagnosis of SARS-CoV-2 by FDA under  an Emergency Use Authorization (EUA). This EUA will remain in effect (meaning this test can be used) for the duration of the COVID-19 declaration under Section 564(b)(1) of the Act, 21 U.S.C. section 360bbb-3(b)(1), unless the authorization is terminated or revoked.     Resp Syncytial Virus by PCR NEGATIVE NEGATIVE Final    Comment: (NOTE) Fact Sheet for Patients: BloggerCourse.com  Fact Sheet for Healthcare Providers: SeriousBroker.it  This test is not yet approved or cleared by the [place] FDA and has been authorized for detection and/or diagnosis of SARS-CoV-2 by FDA under an Emergency Use Authorization (EUA). This EUA will remain in effect (meaning this test can be used) for the duration of the COVID-19 declaration under Section 564(b)(1) of the Act, 21 U.S.C. section 360bbb-3(b)(1), unless the authorization is terminated or revoked.  Performed at [facility] [address] [place], [place] [address]   Respiratory (~20 pathogens) panel by PCR     Status: None   Collection Time: 10/21/23  6:22 PM   Specimen: Nasopharyngeal Swab; Respiratory  Result Value Ref Range Status   Adenovirus NOT DETECTED NOT DETECTED Final   Coronavirus 229E NOT DETECTED NOT DETECTED Final    Comment: (NOTE) The Coronavirus on the Respiratory Panel, DOES NOT test for the novel  Coronavirus (2019 nCoV)    Coronavirus HKU1 NOT DETECTED NOT DETECTED Final   Coronavirus NL63 NOT DETECTED NOT DETECTED Final   Coronavirus OC43 NOT DETECTED NOT DETECTED Final    Metapneumovirus NOT DETECTED NOT DETECTED Final   Rhinovirus / Enterovirus NOT DETECTED NOT DETECTED Final   Influenza A NOT DETECTED NOT DETECTED Final   Influenza B NOT DETECTED NOT DETECTED Final   Parainfluenza Virus 1 NOT DETECTED NOT DETECTED Final   Parainfluenza Virus 2 NOT DETECTED NOT DETECTED Final   Parainfluenza Virus 3 NOT DETECTED NOT DETECTED Final   Parainfluenza Virus 4 NOT DETECTED NOT DETECTED Final   Respiratory Syncytial Virus NOT DETECTED NOT DETECTED Final   Bordetella pertussis NOT DETECTED NOT DETECTED Final   Bordetella Parapertussis NOT DETECTED NOT DETECTED Final   Chlamydophila pneumoniae NOT DETECTED NOT DETECTED Final   Mycoplasma pneumoniae NOT DETECTED NOT DETECTED Final    Comment: Performed at [facility], [address], [place], [place] [address]      Radiology Studies last 3 days: DG Chest Port 1 View Result Date: 10/21/2023 CLINICAL DATA:  [address] COPD (chronic obstructive pulmonary disease) (HCC) [phone] EXAM: PORTABLE CHEST 1 VIEW COMPARISON:  08/23/2023, 05/31/2023 FINDINGS: The heart size and mediastinal contours are within normal limits. Hyperinflated lungs with chronically coarsened interstitial markings bilaterally. No new focal airspace consolidation. No pleural effusion or pneumothorax. The visualized skeletal structures are unremarkable. IMPRESSION: Chronic lung changes without acute cardiopulmonary findings. Electronically Signed   By: Duanne Guess D.O.   On: 10/21/2023 14:25       [name] Nielsen, DO [facility] 10/22/2023, 3:12 PM    Dictation software may have been used to generate the above note. Typos may occur and escape review in typed/dictated notes. Please contact Dr Lyn Hollingshead directly for clarity if needed.  Staff may message me via secure chat in Epic  but this may not receive an immediate response,  please page me for urgent matters!  If 7PM-7AM, please contact night coverage [email or web address]

## 2023-10-22 NOTE — Consult Note (Signed)
 Pharmacy: Dofetilide (Tikosyn) - Follow Up Assessment and Electrolyte Replacement  Assessment: 83 yo M with PMH including pAF, COPD, HTN, HFpEF, CAD s/p stenting (2019) presenting with COPDe. Patient is on dofetilide for Afib and pharmacy has been consulting for managing electrolytes, most notably magnesium and potassium, and monitoring drug-drug interactions during admission. Baseline Qtc <500. No new drug-drug interactions noted. Last dose of dofetilide prior to admission was 3/24 morning time.  Labs:    Component Value Date/Time   K 4.1 10/22/2023 0713   MG 2.3 10/22/2023 6045    Plan: Potassium: K >/= 4: No additional supplementation needed  Magnesium: Mg > 2: No additional supplementation needed  Recheck electrolytes in AM. Continue to monitor for new drug-drug interactions and Qtc prolongation.  Thank you for involving pharmacy in this patient's care.   Rockwell Alexandria, PharmD Clinical Pharmacist 10/22/2023 9:53 AM

## 2023-10-22 NOTE — Hospital Course (Addendum)
 Hospital course / significant events:   HPI: [name] [name]. is a 83 y.o. 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 Worse past 2 days   03/24: admitted to hospitalist service for COPD exacerbation Requiring BiPAP in ED.  03/25: still very SOB, continue tx      Consultants:  none  Procedures/Surgeries: none      ASSESSMENT & PLAN:   Brezrtri   Acute on chronic hypoxic and hypercapnic respiratory failure Acute COPD exacerbation Decompensated chronic combined respiratory acidosis and metabolic alkalosis Continue BiPAP support prn O2 prn  Doxycycline Steroids - IV solumedrol --> po prednisone  Continue home Breztri  Duoneb q6h  Xopenex neb q6h prn  Add pulmicort neb scheduled bid  Mucinex  Incentive spirometry    Acute on chronic HFpEF without decompensation S/p IV diuresis  I&O Continue home furosmide po  Echocardiogram was done recently, will not repeat echo at this time   PAF with question of RVR EKG showed borderline RVR, telemonitoring showed few episode of breakthrough heart rate, now resolved  Continue home Cardizem and Tikosyn PRN Lopressor for breakthrough rate Continue Eliquis   Deconditioning PT/OT evaluation  GERD PPI   No concerns based on BMI: Body mass index is 21.22 kg/m.  Underweight - under 18  overweight - 25 to 29 obese - 30 or more Class 1 obesity: BMI of 30.0 to 34 Class 2 obesity: BMI of 35.0 to 39 Class 3 obesity: BMI of 40.0 to 49 Super Morbid Obesity: BMI 50-59 Super-super Morbid Obesity: BMI 60+ Significantly low or high BMI is associated with higher medical risk.  Weight management advised as adjunct to other disease management and risk reduction treatments    DVT prophylaxis: eliquis  IV  fluids: no continuous IV fluids  Nutrition: cardiac diet Central lines / other devices: none  Code Status: DNR ACP documentation reviewed:  DNR and MOST on file in VYNCA  TOC needs: TBD Medical barriers to dispo: increased O2 requirement SOB and decreased functional capacity prohibits safe discharge at this time. Expected medical readiness for discharge next few days .

## 2023-10-22 NOTE — TOC CM/SW Note (Signed)
 CSW acknowledges consult for Tikosyn initiation. Pharmacy is aware.  Charlynn Court, CSW [phone]

## 2023-10-22 NOTE — TOC Initial Note (Signed)
 Transition of Care (TOC) - Initial/Assessment Note    Patient Details  Name: [name] [name]. MRN: [id number] Date of Birth: 01-24-1941  Transition of Care [facility]) CM/SW Contact:    [name] Broach, LCSW Phone Number: 10/22/2023, 3:48 PM  Clinical Narrative:                 CSW met with patient at bedside and introduced self and reason for visit.  Demographic and insurance information verified.  Patient amenable to completing assessment.  His PCP is [name], MD.  [facility] states that [facility].  Patient lives with his wife, [name] [phone]).  He has no concerns about paying for his meds.  Patient has the following DME:  concentrator, WC, rollator, BCS, and electric scooter.  Patient's baseline O2 is 3L [place] (at rest) and 4L [place] when active.  Wife will transport home at discharge.  TOC to continue to follow for discharge planning needs that may arise.        Patient Goals and CMS Choice            Expected Discharge Plan and Services                                              Prior Living Arrangements/Services                       Activities of Daily Living   ADL Screening (condition at time of admission) Independently performs ADLs?: No Does the patient have a NEW difficulty with bathing/dressing/toileting/self-feeding that is expected to last >3 days?: No Does the patient have a NEW difficulty with getting in/out of bed, walking, or climbing stairs that is expected to last >3 days?: No Does the patient have a NEW difficulty with communication that is expected to last >3 days?: No Is the patient deaf or have difficulty hearing?: No Does the patient have difficulty seeing, even when wearing glasses/contacts?: No Does the patient have difficulty concentrating, remembering, or making decisions?: No  Permission Sought/Granted                  Emotional Assessment              Admission diagnosis:  COPD  (chronic obstructive pulmonary disease) (HCC) [J44.9] Hypercarbia [R06.89] COPD exacerbation (HCC) [J44.1] Patient Active Problem List   Diagnosis Date Noted   Palliative care by specialist 08/28/2023   Pressure injury of skin 08/28/2023   Conjunctival hemorrhage, right 08/21/2023   Preventative health care 07/15/2023   Chronic renal disease, stage III (HCC) 07/15/2023   COPD with acute exacerbation (HCC) 05/31/2023   Hyperglycemia 05/31/2023   Hypercoagulable state due to persistent atrial fibrillation (HCC) 03/14/2023   Paroxysmal atrial fibrillation (HCC) 01/07/2023   Peripheral neuropathy 11/26/2022   Vasomotor rhinitis 10/19/2022   Lower extremity edema 10/01/2022   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) 09/09/2022   Oxygen dependent 08/20/2022   Persistent atrial fibrillation (HCC) 08/20/2022   Atrial flutter (HCC) 08/15/2022   Acute on chronic respiratory failure with hypoxia (HCC) 08/12/2022   Anxiety 08/12/2022   Degenerative disc disease, 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   Degenerative disc disease, 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   Chronic sinusitis 12/07/2020   Chronic anticoagulation 12/07/2020   Osteoarthritis of knee 12/07/2020   Benign prostatic hyperplasia with lower  urinary tract symptoms 12/07/2020   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   Renal cyst 12/07/2020   Chronic obstructive pulmonary disease with (acute) exacerbation (HCC) 12/07/2020   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HCC) 12/07/2020   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HCC)    Coronary artery disease    GERD (gastroesophageal reflux disease)    Kidney cysts    Aspiration pneumonia (HCC)    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   Chronic respiratory failure with hypoxia (HCC) 08/10/2019   Coagulation disorder (HCC) 01/13/2019   Dysphagia 08/14/2018   Coronary atherosclerosis 05/07/2018   Corns and callosities 04/09/2018   Osteoarthritis of first carpometacarpal joint 04/08/2018   Gastro-esophageal reflux disease without esophagitis  04/08/2018   Mixed hyperlipidemia 04/08/2018   Atherosclerotic heart disease of native coronary artery without angina pectoris 04/08/2018   Essential (primary) hypertension 04/08/2018   Sleep disorder 04/08/2018   Pulmonary nodules 06/08/2016   COPD (chronic obstructive pulmonary disease) (HCC) 06/08/2016   PCP:  [name], MD Pharmacy:   [phone] [place], [place] - [address] [place] [address] Phone: [phone] Fax: [phone]  [id number] - [place], [place] - [address] AT [facility] [address] [facility] [address] [place] [address] Phone: [phone] Fax: [phone]  [facility] [address] [place] [address] Phone: [phone] Fax: [phone]  [facility] - [place], [place] - [address] [place] [address] Phone: [phone] Fax: [phone]     Social Drivers of Health (SDOH) Social History: SDOH Screenings   Food Insecurity: No Food Insecurity (10/21/2023)  Housing: Low Risk  (10/21/2023)  Transportation Needs: No Transportation Needs (10/21/2023)  Utilities: Not At Risk (10/21/2023)  Depression (PHQ2-9): Low Risk  (07/15/2023)  Financial Resource Strain: Low Risk  (01/06/2023)  Physical Activity: Sufficiently Active (01/06/2023)  Social Connections: Socially Isolated (10/21/2023)  Stress: No Stress Concern Present (01/06/2023)  Tobacco Use: Medium Risk (10/21/2023)   SDOH Interventions:     Readmission Risk Interventions    10/22/2023    3:48 PM  Readmission Risk Prevention Plan  Transportation Screening Complete  PCP or Specialist Appt within 3-5 Days Complete  HRI or Home Care Consult Complete  Social Work Consult for Recovery Care Planning/Counseling Complete  Palliative Care Screening Complete  Medication Review Oceanographer) Complete

## 2023-10-22 NOTE — Consult Note (Addendum)
 WOC Nurse Consult Note: Patient is followed at [facility] for Stage 2 to sacrum and laceration to LLE; last seen 10/16/2023 with wound care orders for Calmoseptine (moisture barrier which is not on formulary at [facility]) for sacrum and silver for left leg wound  Reason for Consult: sacrum and left lower extremity wound  Wound type: 1. Stage 2 Pressure Injury sacrum  2.  Full thickness left lower leg r/t trauma per [facility] notes  Pressure Injury POA: Yes Measurement: see nursing flowsheet; per last note WCC sacrum 1 cm x 0.3 cm x 0.1 cm; left lower leg 3.5 cm x 4 cm x 0.1 cm  Wound bed: pink moist per [facility] notes  Drainage (amount, consistency, odor) serosanguinous  Periwound: edema noted to legs inH&P note  Dressing procedure/placement/frequency:  Cleanse sacrum/buttocks with Vashe wound cleanser Hart [place] [phone]) do not rinse and allow to air dry.  Apply a thin layer of Desitin to area 2 times a day and prn soiling. May cover with silicone foam or ABD pad and tape whichever is preferred.  Cleanse LLE wound with Vashe wound cleanser Hart Rochester [phone]) do not rinse and allow to air dry. Apply a piece of silver hydrofiber (Aquacel AG Hart Rochester [phone]) cut to fit wound bed daily, cover with silicone foam or ABD pad and Kerlix wrapped beginning right above toes and ending right below knee.  Can secure with Ace bandage applied in the same fashion as Kerlix.    Patient should resume follow-up with wound care center at discharge.    POC discussed with primary nurse. WOC team will not follow. Re-consult if further needs arise.   Thank you,    Priscella Mann MSN, RN-BC, [employer] [phone]

## 2023-10-23 DIAGNOSIS — J438 Other emphysema: Secondary | ICD-10-CM | POA: Diagnosis not present

## 2023-10-23 LAB — BASIC METABOLIC PANEL
Anion gap: 6 (ref 5–15)
BUN: 31 mg/dL — ABNORMAL HIGH (ref 8–23)
CO2: 31 mmol/L (ref 22–32)
Calcium: 8.5 mg/dL — ABNORMAL LOW (ref 8.9–10.3)
Chloride: 98 mmol/L (ref 98–111)
Creatinine, Ser: 0.86 mg/dL (ref 0.61–1.24)
GFR, Estimated: >60 mL/min (ref >60)
Glucose, Bld: 145 mg/dL — ABNORMAL HIGH (ref 70–99)
Potassium: 3.6 mmol/L (ref 3.5–5.1)
Sodium: 135 mmol/L (ref 135–145)

## 2023-10-23 LAB — MAGNESIUM: Magnesium: 2.3 mg/dL (ref 1.7–2.4)

## 2023-10-23 LAB — MYCOPLASMA PNEUMONIAE ANTIBODY, IGM: Mycoplasma pneumo IgM: <770 U/mL (ref 0–769)

## 2023-10-23 LAB — EXPECTORATED SPUTUM ASSESSMENT W GRAM STAIN, RFLX TO RESP C

## 2023-10-23 LAB — LEGIONELLA PNEUMOPHILA SEROGP 1 UR AG: L. pneumophila Serogp 1 Ur Ag: NEGATIVE

## 2023-10-23 MED ORDER — ENSURE ENLIVE PO LIQD
237.0000 mL | Freq: Two times a day (BID) | ORAL | Status: DC
  Administered 2023-10-25 – 2023-10-27 (×6): 237 mL via ORAL

## 2023-10-23 MED ORDER — UMECLIDINIUM-VILANTEROL 62.5-25 MCG/ACT IN AEPB
1.0000 | INHALATION_SPRAY | Freq: Every day | RESPIRATORY_TRACT | Status: DC
  Administered 2023-10-23 – 2023-10-29 (×7): 1 via RESPIRATORY_TRACT
  Filled 2023-10-23: qty 14

## 2023-10-23 MED ORDER — POTASSIUM CHLORIDE CRYS ER 20 MEQ PO TBCR
40.0000 meq | EXTENDED_RELEASE_TABLET | Freq: Once | ORAL | Status: AC
  Administered 2023-10-23: 40 meq via ORAL
  Filled 2023-10-23: qty 2

## 2023-10-23 MED ORDER — BUDESONIDE 0.25 MG/2ML IN SUSP
0.2500 mg | Freq: Two times a day (BID) | RESPIRATORY_TRACT | Status: DC
  Administered 2023-10-23 – 2023-10-24 (×4): 0.25 mg via RESPIRATORY_TRACT
  Filled 2023-10-23 (×4): qty 2

## 2023-10-23 NOTE — Progress Notes (Signed)
 PROGRESS NOTE  [name].    DOB: 1941-06-29, 83 y.o.  [name] [name]:[id number]    Code Status: Limited: Do not attempt resuscitation (DNR) -DNR-LIMITED -Do Not Intubate/DNI    DOA: 10/21/2023   LOS: 2   Brief hospital course  [name] [name]. is a 83 y.o. 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 Worse past 2 days    03/24: admitted to hospitalist service for COPD exacerbation Requiring BiPAP in ED.  3/25-3/26: still very SOB with minimal exertion. Very deconditioned. continue tx. Planning hopeful dc with HH tomorrow  Assessment & Plan  Principal Problem:   COPD (chronic obstructive pulmonary disease) (HCC) Active Problems:   Acute on chronic respiratory failure with hypoxia (HCC)   COPD with acute exacerbation (HCC)  Acute on chronic hypoxic and hypercapnic respiratory failure Acute COPD exacerbation Decompensated chronic combined respiratory acidosis and metabolic alkalosis Continue BiPAP support prn Wean O2 as tolerated Doxycycline Steroids - IV solumedrol --> po prednisone  Continue home Breztri  Duoneb q6h  Xopenex neb q6h prn  Add pulmicort neb scheduled bid  Mucinex  Incentive spirometry  Ambulate as tolerated   Acute on chronic HFpEF without decompensation S/p IV diuresis  I&O Continue home furosmide po  Echocardiogram was done recently, will not repeat echo at this time   PAF with question of RVR EKG showed borderline RVR, telemonitoring showed few episode of breakthrough heart rate, now resolved  Continue home Cardizem and Tikosyn PRN Lopressor for breakthrough rate Continue Eliquis   Deconditioning PT/OT evaluation   GERD PPI    No concerns based on BMI: Body mass index is 21.22 kg/m.  Body mass index is 21.22  kg/m.  VTE ppx:  apixaban (ELIQUIS) tablet 5 mg   Diet:     Diet   Diet Heart Room service appropriate? Yes; Fluid consistency: Thin   Consultants: None   Subjective 10/23/23    Pt reports feeling improved. He would like to go home today if possible. Denies SOB at rest. Has not been up out of bed though to know how he will feel when walking. Agreeable to trying. Uses a rolling walker at baseline.   Objective   Vitals:   10/23/23 0118 10/23/23 0536 10/23/23 0741 10/23/23 0755  BP:  116/65  (!) 145/68  Pulse: 75 73  75  Resp: 18 18  18   Temp:  (!) 97.5 F (36.4 C)  (!) 97.4 F (36.3 C)  TempSrc:    Oral  SpO2: 95% 100% 100% 100%  Weight:      Height:        Intake/Output Summary (Last 24 hours) at 10/23/2023 0759 Last data filed at 10/22/2023 1900 Gross per 24 hour  Intake 240 ml  Output 900 ml  Net -660 ml   Filed Weights   10/21/23 1210  Weight: 68 kg     Physical Exam:  General: awake, alert, NAD HEENT: atraumatic, clear conjunctiva, anicteric sclera, MMM, hearing grossly normal Respiratory: normal respiratory effort. Significant inhalation wheezing. No rales or rhonchi  Cardiovascular: quick capillary refill, normal S1/S2, RRR, no JVD, murmurs Gastrointestinal: soft, NT, ND Nervous: A&O x3. no gross focal neurologic deficits, normal speech Extremities: moves all equally, no edema, normal tone Skin: dry, intact, normal temperature, normal color. No rashes, lesions  or ulcers on exposed skin Psychiatry: normal mood, congruent affect  Labs   I have personally reviewed the following labs and imaging studies CBC    Component Value Date/Time   WBC 4.3 10/22/2023 0713   RBC 3.84 (L) 10/22/2023 0713   HGB 11.1 (L) 10/22/2023 0713   HGB 15.3 05/02/2023 1445   HCT 34.6 (L) 10/22/2023 0713   HCT 46.9 05/02/2023 1445   PLT 302 10/22/2023 0713   PLT 226 05/02/2023 1445   MCV 90.1 10/22/2023 0713   MCV 94 05/02/2023 1445   MCH 28.9 10/22/2023 0713   MCHC 32.1  10/22/2023 0713   RDW 15.2 10/22/2023 0713   RDW 13.1 05/02/2023 1445   LYMPHSABS 0.7 09/01/2023 0535   LYMPHSABS 1.4 04/09/2018 1650   MONOABS 0.8 09/01/2023 0535   EOSABS 0.0 09/01/2023 0535   EOSABS 0.2 04/09/2018 1650   BASOSABS 0.0 09/01/2023 0535   BASOSABS 0.1 04/09/2018 1650      Latest Ref Rng & Units 10/23/2023    5:54 AM 10/22/2023    7:13 AM 10/21/2023   10:35 PM  BMP  Glucose 70 - 99 mg/dL 161  096  045   BUN 8 - 23 mg/dL 31  21  19    Creatinine 0.61 - 1.24 mg/dL 4.09  8.11  9.14   Sodium 135 - 145 mmol/L 135  139  136   Potassium 3.5 - 5.1 mmol/L 3.6  4.1  3.8   Chloride 98 - 111 mmol/L 98  95  91   CO2 22 - 32 mmol/L 31  31  32   Calcium 8.9 - 10.3 mg/dL 8.5  8.7  8.3     DG Chest Port 1 View Result Date: 10/21/2023 CLINICAL DATA:  78295 COPD (chronic obstructive pulmonary disease) (HCC) 62130 EXAM: PORTABLE CHEST 1 VIEW COMPARISON:  08/23/2023, 05/31/2023 FINDINGS: The heart size and mediastinal contours are within normal limits. Hyperinflated lungs with chronically coarsened interstitial markings bilaterally. No new focal airspace consolidation. No pleural effusion or pneumothorax. The visualized skeletal structures are unremarkable. IMPRESSION: Chronic lung changes without acute cardiopulmonary findings. Electronically Signed   By: Duanne Guess D.O.   On: 10/21/2023 14:25    Disposition Plan & Communication  Patient status: Inpatient  Admitted From: Home Planned disposition location: Home health Anticipated discharge date: 3/27 pending respiratory status improving with ambulation  Family Communication: none at bedside    Author: Leeroy Bock, DO [facility] 10/23/2023, 7:59 AM   Available by Epic secure chat 7AM-7PM. If 7PM-7AM, please contact night-coverage.  [facility] contact information found on [email or web address].

## 2023-10-23 NOTE — Progress Notes (Signed)
 Physical Therapy Treatment Patient Details Name: [name] [name]. MRN: [id number] DOB: [birth year] Today's Date: 10/23/2023   History of Present Illness 83 y/o male presented to ED on 10/21/23 for SOB and wheezing. Admitted for acute on chronic hypoxic and hypercapnic respiratory failure. PMH: Advanced COPD on 3-4 L O2 at home, PAF, HTN, chronic HFpEF, CAD    PT Comments  Pt received in Semi-Fowler's position and agreeable to therapy.  Pt noted to be breathing heavy stating he just got back into bed and was still having difficulty with SOB.  Pt noted to have his SpO2 within functional limits >90% throughout the walk.  Pt and therapist discussed discharge planning and benefits with SNF vs HH options, along with rehab potential with pt ultimately deciding on returning home with the [facility] services he has had in the past.  Pt would likely do much better at home, however was educated and encouraged to perform exercises and bouts of walking when not in therapy at home.  Pt verbalized understanding and agreeable to this change in lifestyle.  Pt ultimately left with all questions answered and education on self-care and rationale behind exercising performed.  Pt left in bed with call bell within reach and all other needs met.   Pt will continue to benefit from skilled therapy to address remaining deficits in order to improve overall QoL and return to PLOF.      If plan is discharge home, recommend the following: A lot of help with walking and/or transfers;A lot of help with bathing/dressing/bathroom;Assistance with cooking/housework;Assist for transportation;Help with stairs or ramp for entrance   Can travel by private vehicle        Equipment Recommendations  None recommended by PT    Recommendations for Other Services       Precautions / Restrictions       Mobility  Bed Mobility               General bed mobility comments: deferred as pt was still having issues with breathing.     Transfers                        Ambulation/Gait                   Stairs             Wheelchair Mobility     Tilt Bed    Modified Rankin (Stroke Patients Only)       Balance                                            Communication Communication Communication: No apparent difficulties  Cognition Arousal: Alert Behavior During Therapy: WFL for tasks assessed/performed   PT - Cognitive impairments: No apparent impairments                         Following commands: Intact      Cueing    Exercises      General Comments        Pertinent Vitals/Pain Pain Assessment Pain Assessment: No/denies pain    Home Living                          Prior Function  PT Goals (current goals can now be found in the care plan section) Acute Rehab PT Goals Patient Stated Goal: to go home PT Goal Formulation: With patient Time For Goal Achievement: 11/05/23 Potential to Achieve Goals: Fair Progress towards PT goals: Progressing toward goals    Frequency    Min 2X/week      PT Plan      Co-evaluation              AM-PAC PT "6 Clicks" Mobility   Outcome Measure  Help needed turning from your back to your side while in a flat bed without using bedrails?: A Little Help needed moving from lying on your back to sitting on the side of a flat bed without using bedrails?: A Little Help needed moving to and from a bed to a chair (including a wheelchair)?: A Little Help needed standing up from a chair using your arms (e.g., wheelchair or bedside chair)?: A Little Help needed to walk in hospital room?: A Lot Help needed climbing 3-5 steps with a railing? : Total 6 Click Score: 15    End of Session Equipment Utilized During Treatment: Oxygen Activity Tolerance: Patient limited by fatigue Patient left: in bed;with call bell/phone within reach;with bed alarm set Nurse Communication:  Mobility status PT Visit Diagnosis: Muscle weakness (generalized) (M62.81);Unsteadiness on feet (R26.81);Other abnormalities of gait and mobility (R26.89)     Time: 9604-5409 PT Time Calculation (min) (ACUTE ONLY): 9 min  Charges:    $Self Care/Home Management: 8-22 PT General Charges $$ ACUTE PT VISIT: 1 Visit                     Nolon Bussing, PT, DPT Physical Therapist - [facility]  10/23/23, 4:28 PM

## 2023-10-23 NOTE — Plan of Care (Signed)
  Problem: Education: Goal: Knowledge of General Education information will improve Description: Including pain rating scale, medication(s)/side effects and non-pharmacologic comfort measures Outcome: Progressing   Problem: Health Behavior/Discharge Planning: Goal: Ability to manage health-related needs will improve Outcome: Progressing   Problem: Clinical Measurements: Goal: Ability to maintain clinical measurements within normal limits will improve Outcome: Progressing Goal: Will remain free from infection Outcome: Progressing Goal: Diagnostic test results will improve Outcome: Progressing Goal: Respiratory complications will improve Outcome: Progressing Goal: Cardiovascular complication will be avoided Outcome: Progressing   Problem: Activity: Goal: Risk for activity intolerance will decrease Outcome: Progressing   Problem: Nutrition: Goal: Adequate nutrition will be maintained Outcome: Progressing   Problem: Coping: Goal: Level of anxiety will decrease Outcome: Progressing   Problem: Elimination: Goal: Will not experience complications related to bowel motility Outcome: Progressing Goal: Will not experience complications related to urinary retention Outcome: Progressing   Problem: Pain Managment: Goal: General experience of comfort will improve and/or be controlled Outcome: Progressing   Problem: Safety: Goal: Ability to remain free from injury will improve Outcome: Progressing   Problem: Skin Integrity: Goal: Risk for impaired skin integrity will decrease Outcome: Progressing   Problem: Education: Goal: Knowledge of disease or condition will improve Outcome: Progressing Goal: Knowledge of the prescribed therapeutic regimen will improve Outcome: Progressing Goal: Individualized Educational Video(s) Outcome: Progressing   Problem: Activity: Goal: Ability to tolerate increased activity will improve Outcome: Progressing Goal: Will verbalize the  importance of balancing activity with adequate rest periods Outcome: Progressing   Problem: Respiratory: Goal: Ability to maintain a clear airway will improve Outcome: Progressing Goal: Levels of oxygenation will improve Outcome: Progressing Goal: Ability to maintain adequate ventilation will improve Outcome: Progressing   Problem: Education: Goal: Knowledge of disease or condition will improve Outcome: Progressing Goal: Understanding of medication regimen will improve Outcome: Progressing Goal: Individualized Educational Video(s) Outcome: Progressing   Problem: Activity: Goal: Ability to tolerate increased activity will improve Outcome: Progressing   Problem: Cardiac: Goal: Ability to achieve and maintain adequate cardiopulmonary perfusion will improve Outcome: Progressing   Problem: Health Behavior/Discharge Planning: Goal: Ability to safely manage health-related needs after discharge will improve Outcome: Progressing

## 2023-10-23 NOTE — Plan of Care (Signed)
  Problem: Education: Goal: Knowledge of General Education information will improve Description: Including pain rating scale, medication(s)/side effects and non-pharmacologic comfort measures Outcome: Progressing   Problem: Clinical Measurements: Goal: Respiratory complications will improve Outcome: Progressing   Problem: Clinical Measurements: Goal: Cardiovascular complication will be avoided Outcome: Progressing   Problem: Pain Managment: Goal: General experience of comfort will improve and/or be controlled Outcome: Progressing   Problem: Safety: Goal: Ability to remain free from injury will improve Outcome: Progressing

## 2023-10-24 DIAGNOSIS — R0689 Other abnormalities of breathing: Secondary | ICD-10-CM | POA: Diagnosis not present

## 2023-10-24 DIAGNOSIS — J438 Other emphysema: Secondary | ICD-10-CM | POA: Diagnosis not present

## 2023-10-24 DIAGNOSIS — J441 Chronic obstructive pulmonary disease with (acute) exacerbation: Secondary | ICD-10-CM | POA: Diagnosis not present

## 2023-10-24 LAB — BASIC METABOLIC PANEL WITH GFR
Anion gap: 8 (ref 5–15)
BUN: 36 mg/dL — ABNORMAL HIGH (ref 8–23)
CO2: 32 mmol/L (ref 22–32)
Calcium: 8.7 mg/dL — ABNORMAL LOW (ref 8.9–10.3)
Chloride: 99 mmol/L (ref 98–111)
Creatinine, Ser: 0.94 mg/dL (ref 0.61–1.24)
GFR, Estimated: >60 mL/min (ref >60)
Glucose, Bld: 107 mg/dL — ABNORMAL HIGH (ref 70–99)
Potassium: 4.4 mmol/L (ref 3.5–5.1)
Sodium: 139 mmol/L (ref 135–145)

## 2023-10-24 LAB — MAGNESIUM: Magnesium: 2.3 mg/dL (ref 1.7–2.4)

## 2023-10-24 MED ORDER — IPRATROPIUM-ALBUTEROL 0.5-2.5 (3) MG/3ML IN SOLN: 3.0000 mL | RESPIRATORY_TRACT | Status: DC

## 2023-10-24 MED ORDER — IPRATROPIUM-ALBUTEROL 0.5-2.5 (3) MG/3ML IN SOLN
3.0000 mL | Freq: Four times a day (QID) | RESPIRATORY_TRACT | Status: DC
  Administered 2023-10-24 – 2023-10-26 (×9): 3 mL via RESPIRATORY_TRACT
  Filled 2023-10-24 (×9): qty 3

## 2023-10-24 MED ORDER — FUROSEMIDE 40 MG PO TABS
40.0000 mg | ORAL_TABLET | Freq: Every day | ORAL | Status: DC
  Administered 2023-10-25 – 2023-10-29 (×5): 40 mg via ORAL
  Filled 2023-10-24 (×5): qty 1

## 2023-10-24 MED ORDER — IPRATROPIUM-ALBUTEROL 0.5-2.5 (3) MG/3ML IN SOLN: 3.0000 mL | Freq: Three times a day (TID) | RESPIRATORY_TRACT | Status: DC

## 2023-10-24 MED ORDER — BUDESONIDE 0.5 MG/2ML IN SUSP
0.5000 mg | Freq: Two times a day (BID) | RESPIRATORY_TRACT | Status: DC
  Administered 2023-10-24 – 2023-10-29 (×10): 0.5 mg via RESPIRATORY_TRACT
  Filled 2023-10-24 (×10): qty 2

## 2023-10-24 MED ORDER — MORPHINE SULFATE (PF) 2 MG/ML IV SOLN
2.0000 mg | Freq: Once | INTRAVENOUS | Status: AC
  Administered 2023-10-24: 2 mg via INTRAVENOUS
  Filled 2023-10-24: qty 1

## 2023-10-24 NOTE — Consult Note (Signed)
 [facility] [place] Pulmonary Medicine Consultation      Date: 10/24/2023,   MRN# [id number] [name] [name]. Aug 17, 1940     CHIEF COMPLAINT:   Acute SOB   HISTORY OF PRESENT ILLNESS  83 year old male with a past medical history of COPD end stage   Patient was previously followed by Dr. Delton Coombes, last saw him on 10/19/2022. He has a history of COPD, with chronic hypoxic respiratory failure maintained on 2-3 L via nasal cannula. He has also been followed by Dr. Delton Coombes for pulmonary nodular disease requiring serial CT scans and bronchoscopies with nodular biopsies, without an established diagnosis. Some of the biopsies had shown granulomatous inflammation. Multiple CT scans had shown waxing and waning pulmonary nodular disease. Previous lung biopsies are from 2017 (3 targets, benign lung parenchyma with mild fibrosis and anthracosis), 09/2019 (benign cytology, noncaseating granulomatous inflammation from RLL biopsy), and 03/2020 (negative cytology and pathology).   OUTPATIENT THERAPY He is maintained on Breztri for his COPD, and is also using duo-nebs and albuterol via nebulizer. Patient is chronically maintained on prednisone therapy, currently at 10 mg orally once daily. He reports being on prednisone for years.  admitted to the hospital on 05/31/2023 secondary to increased shortness of breath with increased sputum production. He was admitted for acute on chronic hypoxic respiratory failure requiring IV steroids, IV antibiotics, and nebulizers. He felt better after his discharge and his symptoms are back to baseline. Previously also admitted in January of 2024 secondary to acute on chronic hypoxic and hypercapnic respiratory failure due to RSV infection.      The patient's other medical conditions include HFpEF, Afib, and CAD s/p PCI. He is maintained on apixaban, dofetilide, digoxin, furosemide, gabapentin, and rosuvastatin. He is also chronically on prednisone 10 mg daily and is also on  gabapentin.   He has a long standing history of smoking, quit 3 years ago. He has at least 50 pack years of smoking history. He used to work in [employer] and denies occupational exposures.     Patient currently admitted for severe  COPD exacerbation Systemic steroids, NEB THERAPY AND OXYGEN THERAPY Increased WOB On NEBS, STEROIDS, OXYGEN THERAPY Plan for assessment for noninvasive ventilation     Patient has severe end stage COPD with FEV1<50% Patient continues to exhibit signs of hypercapnia associated with chronic respiratory failure secondary to severe end-stage COPD.    Patient requires the use of NIV both nightly and daytime to help with exacerbation periods. The use of NIV will treat patient's high PCO2 67 levels and can reduce the risk of exacerbations in future hospitalizations when used at night and during the day.  Patient will need these advanced settings in conjunction with his current medication regimen :BiPAP with backup rate has been tried and failed.  Failure to have NIV available for use over 24hrs  Could lead to severe respiratory failure and cardiaca arrest and death  In my opinion the patient will benefit from nocturnal NIV likely decreasing the need for frequent readmissions.   Noninvasive ventilation will also decrease morbidity and mortality in this patient due to increasing carbon dioxide levels. The use of the NIV will treat patient's high PCO2 levels and can reduce risk of exacerbations in future hospitalizations when used in a mandatory fashion at night and as needed during the day.  Patient will need these advanced settings in conjunction with her current medication regimen.   Patient can maintain and clear their own airway.Patient is able to clear secretions and protect  airway.   Failure to have NIV available for home use could lead to encephalopathy, cardiac arrest and death.   Current recommendation is for adapt VPAP or AVS, auto if possible.        PAST MEDICAL HISTORY   Past Medical History:  Diagnosis Date   Abdominal discomfort 12/07/2020   Acquired trigger finger of right middle finger 02/04/2020   Acute prostatitis 04/08/2018   Alcohol abuse    Alcohol dependence (HCC) 12/07/2020   Anal or rectal pain 04/08/2018   Annual physical exam 12/07/2020   Anorectal disorder 12/07/2020   Arthritis    Atherosclerotic heart disease of native coronary artery without angina pectoris 04/08/2018   a. 03/2018 PCI: LM nl, LAD 14m, LCX nl, OM1 70p (2.75x8 Synergy DES), 63m (2.5x24 Synergy DES), RCA 25p, RPL1 50. EF 55-65%.   Atrial flutter (HCC)    a. 07/2022 in setting of hospitalization for resp illness/RSV. CHA2DS2VASc = 4-->eliquis; b. 08/2022 s/p DCCV (100J); c. 09/26/2022 Recurrent Aflutter.   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 02/2022 Carotid U/S: 1-39% bilat ICA stenoses.   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   Contracture of palmar fascia 12/07/2020   COPD (chronic obstructive pulmonary disease) (HCC)    Corn of toe 12/07/2020   Corns and callosities 04/09/2018   Coronary artery disease    2019 with stents   Coronary atherosclerosis 05/07/2018   Degenerative disc disease, 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   Degenerative disc disease, 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   Diarrhea 04/09/2018   Double vision with both eyes open 12/07/2020   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   Dysphagia 08/14/2018   Dyspnea    ED (erectile dysfunction)    ED (erectile dysfunction) of organic origin  12/07/2020   Elevated prostate specific antigen (PSA) 04/08/2018   Elevated PSA    Encounter for screening for other disorder 12/07/2020   Enlarged prostate without lower urinary tract symptoms (luts) 04/09/2018   Enthesopathy 12/07/2020   Essential (primary) hypertension 04/08/2018   Ex-smoker 04/09/2018   Exertional dyspnea 04/02/2018   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 07/2022 Echo: EF 65-70%, no rwma, mild LVH, nl RV fxn.   Hyperlipidemia    Hypertension    Hypoxemia 12/07/2020   Kidney cysts    Left knee pain 12/07/2020   Low back pain 04/08/2018   Male erectile dysfunction 04/09/2018   Mixed hyperlipidemia 04/08/2018   Nocturia more than twice per night 04/09/2018   Osteoarthritis of first carpometacarpal joint 04/08/2018   Osteoarthritis of knee 12/07/2020   Other long term (current) drug therapy 12/07/2020   Pain due to onychomycosis of toenail of left foot 07/20/2019   Pain in joint 04/09/2018   Pain in knee 04/09/2018   Pain in unspecified knee 12/07/2020   Pain of finger 04/09/2018   Palpitations    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HCC) 12/07/2020  Personal history of colonic polyps 12/07/2020   Pneumonia    Pneumothorax 04/12/2020   Polypharmacy 04/09/2018   Porokeratosis 01/13/2019   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HCC)    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   Pulmonary nodules 06/08/2016   Pure hypercholesterolemia 12/07/2020   Renal cyst 12/07/2020   Sleep disorder 04/08/2018   Status post bronchoscopy 04/12/2020   Tear of rotator cuff 04/09/2018   Tobacco user 12/07/2020   Uncomplicated alcohol dependence (HCC) 04/09/2018   Unspecified rotator cuff tear or rupture of unspecified shoulder, not specified as traumatic 12/07/2020   Unspecified tear of  unspecified meniscus, current injury, unspecified knee, initial encounter 12/07/2020   Visual disturbance 12/07/2020   Vitamin D deficiency 04/08/2018     SURGICAL HISTORY   Past Surgical History:  Procedure Laterality Date   BRONCHIAL BIOPSY  10/13/2019   Procedure: BRONCHIAL BIOPSIES;  Surgeon: Leslye Peer, MD;  Location: [facility] ENDOSCOPY;  Service: Pulmonary;;   BRONCHIAL BIOPSY  04/12/2020   Procedure: BRONCHIAL BIOPSIES;  Surgeon: Leslye Peer, MD;  Location: [facility] ENDOSCOPY;  Service: Pulmonary;;   BRONCHIAL BRUSHINGS  10/13/2019   Procedure: BRONCHIAL BRUSHINGS;  Surgeon: Leslye Peer, MD;  Location: [facility] ENDOSCOPY;  Service: Pulmonary;;   BRONCHIAL BRUSHINGS  04/12/2020   Procedure: BRONCHIAL BRUSHINGS;  Surgeon: Leslye Peer, MD;  Location: [facility] ENDOSCOPY;  Service: Pulmonary;;   BRONCHIAL NEEDLE ASPIRATION BIOPSY  10/13/2019   Procedure: BRONCHIAL NEEDLE ASPIRATION BIOPSIES;  Surgeon: Leslye Peer, MD;  Location: [facility] ENDOSCOPY;  Service: Pulmonary;;   BRONCHIAL NEEDLE ASPIRATION BIOPSY  04/12/2020   Procedure: BRONCHIAL NEEDLE ASPIRATION BIOPSIES;  Surgeon: Leslye Peer, MD;  Location: [facility] ENDOSCOPY;  Service: Pulmonary;;   BRONCHIAL WASHINGS  10/13/2019   Procedure: BRONCHIAL WASHINGS;  Surgeon: Leslye Peer, MD;  Location: [facility] ENDOSCOPY;  Service: Pulmonary;;   BRONCHIAL WASHINGS  04/12/2020   Procedure: BRONCHIAL WASHINGS;  Surgeon: Leslye Peer, MD;  Location: [facility] ENDOSCOPY;  Service: Pulmonary;;   CARDIAC CATHETERIZATION  2002   50% RCA   CARDIOVERSION N/A 09/17/2022   Procedure: CARDIOVERSION;  Surgeon: Iran Ouch, MD;  Location: [facility] ORS;  Service: Cardiovascular;  Laterality: N/A;   CATARACT EXTRACTION, BILATERAL     CORONARY STENT INTERVENTION N/A 04/15/2018   Procedure: CORONARY STENT INTERVENTION;  Surgeon: Corky Crafts, MD;  Location: [facility] INVASIVE CV LAB;  Service: Cardiovascular;  Laterality: N/A;  om1   EYE SURGERY     KNEE ARTHROSCOPY     LEFT  HEART CATH AND CORONARY ANGIOGRAPHY N/A 04/15/2018   Procedure: LEFT HEART CATH AND CORONARY ANGIOGRAPHY;  Surgeon: Corky Crafts, MD;  Location: [facility] INVASIVE CV LAB;  Service: Cardiovascular;  Laterality: N/A;   MULTIPLE TOOTH EXTRACTIONS     TONSILLECTOMY     VIDEO BRONCHOSCOPY  04/12/2020   VIDEO BRONCHOSCOPY WITH ENDOBRONCHIAL NAVIGATION N/A 07/26/2016   Procedure: VIDEO BRONCHOSCOPY WITH ENDOBRONCHIAL NAVIGATION;  Surgeon: Leslye Peer, MD;  Location: [facility] OR;  Service: Thoracic;  Laterality: N/A;   VIDEO BRONCHOSCOPY WITH ENDOBRONCHIAL NAVIGATION N/A 10/13/2019   Procedure: [facility] AND VIDEO BRONCHOSCOPY WITH ENDOBRONCHIAL NAVIGATION;  Surgeon: Leslye Peer, MD;  Location: [facility] ENDOSCOPY;  Service: Pulmonary;  Laterality: N/A;   VIDEO BRONCHOSCOPY WITH ENDOBRONCHIAL NAVIGATION N/A 04/12/2020   Procedure: VIDEO BRONCHOSCOPY WITH ENDOBRONCHIAL NAVIGATION;  Surgeon: Leslye Peer, MD;  Location: [facility] ENDOSCOPY;  Service: Pulmonary;  Laterality: N/A;     FAMILY HISTORY   Family History  Problem Relation Age of Onset   Hypertension Mother  Alzheimer's disease Mother      SOCIAL HISTORY   Social History   Tobacco Use   Smoking status: Former    Current packs/day: 0.00    Average packs/day: 0.8 packs/day for 68.0 years (51.0 ttl pk-yrs)    Types: Cigarettes    Start date: 36    Quit date: 07/13/2020    Years since quitting: 3.2    Passive exposure: Never   Smokeless tobacco: Never   Tobacco comments:    Recent Quit    Vaping Use   Vaping status: Former  Substance Use Topics   Alcohol use: No    Alcohol/week: 0.0 standard drinks of alcohol    Comment: quit drinking 2014   Drug use: No     MEDICATIONS    Home Medication:    Current Medication:  Current Facility-Administered Medications:    acetaminophen (TYLENOL) tablet 650 mg, 650 mg, Oral, Q6H PRN **OR** acetaminophen (TYLENOL) suppository 650 mg, 650 mg, Rectal, Q6H PRN, Emeline General, MD   apixaban  (ELIQUIS) tablet 5 mg, 5 mg, Oral, BID, Mikey College T, MD, 5 mg at 10/24/23 0902   budesonide (PULMICORT) nebulizer solution 0.5 mg, 0.5 mg, Nebulization, BID, Leeroy Bock, MD   clonazePAM Scarlette Calico) tablet 0.5 mg, 0.5 mg, Oral, QHS, Mikey College T, MD, 0.5 mg at 10/23/23 2151   diltiazem (CARDIZEM CD) 24 hr capsule 120 mg, 120 mg, Oral, BID, Mikey College T, MD, 120 mg at 10/24/23 0902   dofetilide (TIKOSYN) capsule 250 mcg, 250 mcg, Oral, BID, Mikey College T, MD, 250 mcg at 10/24/23 4098   doxycycline (VIBRA-TABS) tablet 100 mg, 100 mg, Oral, Q12H, Mikey College T, MD, 100 mg at 10/24/23 1191   feeding supplement (ENSURE ENLIVE / ENSURE PLUS) liquid 237 mL, 237 mL, Oral, BID BM, Leeroy Bock, MD   finasteride (PROSCAR) tablet 5 mg, 5 mg, Oral, Daily, Chipper Herb, Ping T, MD, 5 mg at 10/24/23 0902   fluticasone (FLONASE) 50 MCG/ACT nasal spray 2 spray, 2 spray, Each Nare, Daily, Mansy, Jan A, MD, 2 spray at 10/24/23 0909   [START ON 10/25/2023] furosemide (LASIX) tablet 40 mg, 40 mg, Oral, Daily, Leeroy Bock, MD   gabapentin (NEURONTIN) capsule 200 mg, 200 mg, Oral, QHS, Zhang, Ping T, MD, 200 mg at 10/23/23 2152   guaiFENesin (MUCINEX) 12 hr tablet 1,200 mg, 1,200 mg, Oral, BID, Sunnie Nielsen, DO, 1,200 mg at 10/24/23 4782   ipratropium-albuterol (DUONEB) 0.5-2.5 (3) MG/3ML nebulizer solution 3 mL, 3 mL, Nebulization, Q4H, Starkeisha Vanwinkle, MD   liver oil-zinc oxide (DESITIN) 40 % ointment, , Topical, BID, Mikey College T, MD, Given at 10/24/23 0022   oxyCODONE (Oxy IR/ROXICODONE) immediate release tablet 5-10 mg, 5-10 mg, Oral, Q6H PRN, Sunnie Nielsen, DO, 10 mg at 10/22/23 1235   pantoprazole (PROTONIX) EC tablet 40 mg, 40 mg, Oral, Daily, Mikey College T, MD, 40 mg at 10/24/23 0902   polyethylene glycol (MIRALAX / GLYCOLAX) packet 17 g, 17 g, Oral, Daily PRN, Mikey College T, MD   [COMPLETED] methylPREDNISolone sodium succinate (SOLU-MEDROL) 40 mg/mL injection 40 mg, 40 mg,  Intravenous, Q12H, 40 mg at 10/22/23 1033 **FOLLOWED BY** predniSONE (DELTASONE) tablet 40 mg, 40 mg, Oral, Q breakfast, Mikey College T, MD, 40 mg at 10/24/23 0902   rosuvastatin (CRESTOR) tablet 20 mg, 20 mg, Oral, QHS, Mikey College T, MD, 20 mg at 10/23/23 2152   senna (SENOKOT) tablet 17.2 mg, 2 tablet, Oral, QHS, Mikey College T, MD, 17.2 mg at 10/23/23  2152   sodium chloride flush (NS) 0.9 % injection 3 mL, 3 mL, Intravenous, Q12H, Mikey College T, MD, 3 mL at 10/24/23 0909   sodium chloride flush (NS) 0.9 % injection 3 mL, 3 mL, Intravenous, PRN, Emeline General, MD   umeclidinium-vilanterol (ANORO ELLIPTA) 62.5-25 MCG/ACT 1 puff, 1 puff, Inhalation, Daily, Sunnie Nielsen, DO, 1 puff at 10/24/23 0908    ALLERGIES   Flagyl [metronidazole]     REVIEW OF SYSTEMS    Review of Systems:  Gen:  Denies  fever, sweats, chills weigh loss  HEENT: Denies blurred vision, double vision, ear pain, eye pain, hearing loss, nose bleeds, sore throat Cardiac:  No dizziness, chest pain or heaviness, chest tightness,edema Resp:   +cough + sputum porduction, +shortness of breath,+wheezing, hemoptysis,  Gi: Denies swallowing difficulty, stomach pain, nausea or vomiting, diarrhea, constipation, bowel incontinence Gu:  Denies bladder incontinence, burning urine Ext:   Denies Joint pain, stiffness or swelling Skin: Denies  skin rash, easy bruising or bleeding or hives Endoc:  Denies polyuria, polydipsia , polyphagia or weight change Psych:   Denies depression, insomnia or hallucinations   Other:  All other systems negative   VS: BP 123/62 (BP Location: Right Arm)   Pulse 78   Temp (!) 97.5 F (36.4 C)   Resp 18   Ht 5' 10.5" (1.791 m)   Wt 68 kg   SpO2 100%   BMI 21.22 kg/m      PHYSICAL EXAM  General Appearance: + distress  EYES PERRLA, EOM intact.   NECK Supple, No JVD Pulmonary: normal breath sounds, + wheezing.  CardiovascularNormal S1,S2.  No m/r/g.   Abdomen: Benign, Soft,  non-tender. Skin:   warm, no rashes, no ecchymosis  Extremities: normal, no cyanosis, clubbing. Neuro:without focal findings,  speech normal  PSYCHIATRIC: Mood, affect within normal limits.   ALL OTHER ROS ARE NEGATIVE      IMAGING    DG Chest Port 1 View Result Date: 10/21/2023 CLINICAL DATA:  [id number] COPD (chronic obstructive pulmonary disease) (HCC) 13086 EXAM: PORTABLE CHEST 1 VIEW COMPARISON:  08/23/2023, 05/31/2023 FINDINGS: The heart size and mediastinal contours are within normal limits. Hyperinflated lungs with chronically coarsened interstitial markings bilaterally. No new focal airspace consolidation. No pleural effusion or pneumothorax. The visualized skeletal structures are unremarkable. IMPRESSION: Chronic lung changes without acute cardiopulmonary findings. Electronically Signed   By: Duanne Guess D.O.   On: 10/21/2023 14:25      ASSESSMENT/PLAN   83 yo white male with acute COPD exacerbation with end stage COPD with chronic hypoxic and hypercapnic resp failure with recurrent bouts of COPD exacerbation   SEVERE COPD EXACERBATION -continue  steroids as prescribed -continue NEB THERAPY as prescribed -morphine as needed -wean fio2 as needed and tolerated Continue lasix as tolerated  Assess for non-invasive ventilation-TOC consultation    Patient  satisfied with Plan of action and management. All questions answered   I spent a total of 85 minutes reviewing chart data, face-to-face evaluation with the patient, counseling and coordination of care as detailed above.     Lucie Leather, M.D.  [facility]  Medical Director [facility] Medical Director [facility] Cardio-Pulmonary Department

## 2023-10-24 NOTE — Progress Notes (Addendum)
 Pt refused wound care at this time  states "The Nurse changed the dressing at 4:00 PM yesterday, and I would like to do the wound care the same time today." Pt was educated about its importance. Will notify incoming shift. Will continue to monitor.

## 2023-10-24 NOTE — Progress Notes (Addendum)
 Per patient report" I spoke with the doctor about ordering CPAP at bedtime to wear tonight." RN staff check patient MAR but did not see CPAP order. NP Jon Billings made aware. Will continue to monitor.  Update 2039: See new order. Will continue to monitor.  Update 2126: QTC at 444 per CCMD. Will continue to monitor.  Update 2214: Pt refused CPAP tonight d/t not having dehumidifier on the machine. Pt was educated about its importance. Wil notify incoming shift. Will continue to monitor.

## 2023-10-24 NOTE — Progress Notes (Signed)
 Physical Therapy Treatment Patient Details Name: [name] [name]. MRN: [id number] DOB: 03/09/1941 Today's Date: 10/24/2023   History of Present Illness 83 y/o male presented to ED on 10/21/23 for SOB and wheezing. Admitted for acute on chronic hypoxic and hypercapnic respiratory failure. PMH: Advanced COPD on 3-4 L O2 at home, PAF, HTN, chronic HFpEF, CAD    PT Comments  Patient is agreeable with encouragement from spouse. Bed mobility performed with supervision. Occasional steadying assistance with transfer from bed to chair using rolling walker. Dyspnea with exertion with Sp02 95%. Education on energy conservation strategies. He is hopeful to discharge home soon. PT will continue to follow.    If plan is discharge home, recommend the following: Assistance with cooking/housework;Assist for transportation;Help with stairs or ramp for entrance;A little help with walking and/or transfers;A little help with bathing/dressing/bathroom   Can travel by private vehicle        Equipment Recommendations  None recommended by PT    Recommendations for Other Services       Precautions / Restrictions Precautions Precautions: Fall Recall of Precautions/Restrictions: Intact Precaution/Restrictions Comments: O2 Restrictions Weight Bearing Restrictions Per Provider Order: No     Mobility  Bed Mobility Overal bed mobility: Needs Assistance Bed Mobility: Supine to Sit     Supine to sit: Supervision, HOB elevated     General bed mobility comments: increased time, no physical assistance needed    Transfers Overall transfer level: Needs assistance Equipment used: Rolling walker (2 wheels) Transfers: Bed to chair/wheelchair/BSC     Step pivot transfers: Min assist, Contact guard assist       General transfer comment: initial lifting assistance provided and CGA otherwise. patient has dyspnea with exertion and has difficulty with breathing techniques despite education. Sp02 95%.     Ambulation/Gait               General Gait Details: patient declined further mobility but was agreeable to get to the chair for lunch   Stairs             Wheelchair Mobility     Tilt Bed    Modified Rankin (Stroke Patients Only)       Balance Overall balance assessment: Needs assistance Sitting-balance support: No upper extremity supported, Feet supported Sitting balance-[name]: Fair     Standing balance support: Bilateral upper extremity supported Standing balance-[name]: Poor Standing balance comment: RW for support in standing                            Communication Communication Communication: No apparent difficulties  Cognition Arousal: Alert Behavior During Therapy: WFL for tasks assessed/performed   PT - Cognitive impairments: No apparent impairments                       PT - Cognition Comments: need motivation Following commands: Intact      Cueing Cueing Techniques: Verbal cues  Exercises      General Comments General comments (skin integrity, edema, etc.): educated patient on energy conservation strategies      Pertinent Vitals/Pain Pain Assessment Pain Assessment: No/denies pain    Home Living                          Prior Function            PT Goals (current goals can now be found in the care plan  section) Acute Rehab PT Goals Patient Stated Goal: to go home PT Goal Formulation: With patient Time For Goal Achievement: 11/05/23 Potential to Achieve Goals: Fair Progress towards PT goals: Progressing toward goals    Frequency    Min 2X/week      PT Plan      Co-evaluation              AM-PAC PT "6 Clicks" Mobility   Outcome Measure  Help needed turning from your back to your side while in a flat bed without using bedrails?: None Help needed moving from lying on your back to sitting on the side of a flat bed without using bedrails?: A Little Help needed  moving to and from a bed to a chair (including a wheelchair)?: A Little Help needed standing up from a chair using your arms (e.g., wheelchair or bedside chair)?: A Little Help needed to walk in hospital room?: A Little Help needed climbing 3-5 steps with a railing? : A Lot 6 Click Score: 18    End of Session Equipment Utilized During Treatment: Oxygen Activity Tolerance: Patient limited by fatigue Patient left: in chair;with call bell/phone within reach;with family/visitor present Nurse Communication: Mobility status PT Visit Diagnosis: Muscle weakness (generalized) (M62.81);Unsteadiness on feet (R26.81);Other abnormalities of gait and mobility (R26.89)     Time: 1140-1155 PT Time Calculation (min) (ACUTE ONLY): 15 min  Charges:    $Therapeutic Activity: 8-22 mins PT General Charges $$ ACUTE PT VISIT: 1 Visit                     Donna Bernard, PT, MPT   Ina Homes 10/24/2023, 12:53 PM

## 2023-10-24 NOTE — Progress Notes (Signed)
 PROGRESS NOTE  [name] [name].    DOB: 1941-03-19, 83 y.o.  GEX:[id number]    Code Status: Limited: Do not attempt resuscitation (DNR) -DNR-LIMITED -Do Not Intubate/DNI    DOA: 10/21/2023   LOS: 3   Brief hospital course  [name] [name]. is a 83 y.o. 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 Worse past 2 days    03/24: admitted to hospitalist service for COPD exacerbation Requiring BiPAP in ED.  3/25-3/27: still very SOB with minimal exertion. Very deconditioned. continue tx. Dc not happening today. Consulting pulmonology for additional recs  Assessment & Plan  Principal Problem:   COPD (chronic obstructive pulmonary disease) (HCC) Active Problems:   Acute on chronic respiratory failure with hypoxia (HCC)   COPD with acute exacerbation (HCC)  Acute on chronic hypoxic and hypercapnic respiratory failure Acute COPD exacerbation Decompensated chronic combined respiratory acidosis and metabolic alkalosis Continue BiPAP support prn Wean O2 as tolerated Doxycycline Steroids - IV solumedrol --> po prednisone  Continue home Breztri  Duoneb q6h  Xopenex neb q6h prn  Add pulmicort neb scheduled bid and increased dose Mucinex  Incentive spirometry  Ambulate as tolerated   Acute on chronic HFpEF without decompensation S/p IV diuresis  I&O Continue home furosmide po. Increased dose. Appears euvolemic on exam Echocardiogram was done recently, will not repeat echo at this time   PAF with question of RVR- rate controlled EKG showed borderline RVR, telemonitoring showed few episode of breakthrough heart rate, now resolved  Continue home Cardizem and Tikosyn Continue Eliquis   Deconditioning PT/OT evaluation   GERD PPI    No concerns based on BMI:  Body mass index is 21.22 kg/m.  Body mass index is 21.22 kg/m.  VTE ppx:  apixaban (ELIQUIS) tablet 5 mg   Diet:     Diet   Diet regular Room service appropriate? Yes; Fluid consistency: Thin   Consultants: None   Subjective 10/24/23    Pt reports still feeling SOB. Wants to go home if possible. Has pulmonology appointment tomorrow. Wants to know why he never gets better.    Objective   Vitals:   10/24/23 0000 10/24/23 0200 10/24/23 0441 10/24/23 0734  BP:   118/76   Pulse:   75   Resp:   18   Temp: 97.6 F (36.4 C)  97.7 F (36.5 C)   TempSrc: Oral  Oral   SpO2:  97% 100% 100%  Weight:      Height:        Intake/Output Summary (Last 24 hours) at 10/24/2023 0749 Last data filed at 10/23/2023 1055 Gross per 24 hour  Intake 240 ml  Output --  Net 240 ml   Filed Weights   10/21/23 1210  Weight: 68 kg     Physical Exam:  General: awake, alert, mild distress HEENT: atraumatic, clear conjunctiva, anicteric sclera, MMM, hearing grossly normal Respiratory: pursed lip breathing. Significant inhalation wheezing. No rales or rhonchi  Cardiovascular: quick capillary refill, normal S1/S2, RRR, no JVD, murmurs Gastrointestinal: soft, NT, ND Nervous: A&O x3. no gross focal neurologic deficits, normal speech Extremities: moves all equally, no edema, normal tone Skin: dry, intact, normal temperature, normal color. No rashes, lesions or ulcers on exposed skin Psychiatry: normal mood, congruent affect  Labs  I have personally reviewed the following labs and imaging studies CBC    Component Value Date/Time   WBC 4.3 10/22/2023 0713   RBC 3.84 (L) 10/22/2023 0713   HGB 11.1 (L) 10/22/2023 0713   HGB 15.3 05/02/2023 1445   HCT 34.6 (L) 10/22/2023 0713   HCT 46.9 05/02/2023 1445   PLT 302 10/22/2023 0713   PLT 226 05/02/2023 1445   MCV 90.1 10/22/2023 0713   MCV 94 05/02/2023 1445   MCH 28.9 10/22/2023 0713   MCHC 32.1 10/22/2023 0713   RDW 15.2 10/22/2023 0713    RDW 13.1 05/02/2023 1445   LYMPHSABS 0.7 09/01/2023 0535   LYMPHSABS 1.4 04/09/2018 1650   MONOABS 0.8 09/01/2023 0535   EOSABS 0.0 09/01/2023 0535   EOSABS 0.2 04/09/2018 1650   BASOSABS 0.0 09/01/2023 0535   BASOSABS 0.1 04/09/2018 1650      Latest Ref Rng & Units 10/24/2023    5:15 AM 10/23/2023    5:54 AM 10/22/2023    7:13 AM  BMP  Glucose 70 - 99 mg/dL 161  096  045   BUN 8 - 23 mg/dL 36  31  21   Creatinine 0.61 - 1.24 mg/dL 4.09  8.11  9.14   Sodium 135 - 145 mmol/L 139  135  139   Potassium 3.5 - 5.1 mmol/L 4.4  3.6  4.1   Chloride 98 - 111 mmol/L 99  98  95   CO2 22 - 32 mmol/L 32  31  31   Calcium 8.9 - 10.3 mg/dL 8.7  8.5  8.7     No results found.   Disposition Plan & Communication  Patient status: Inpatient  Admitted From: Home Planned disposition location: Home health Anticipated discharge date: 3/28 pending respiratory status improving with ambulation  Family Communication: none at bedside    Author: Leeroy Bock, DO [facility] 10/24/2023, 7:49 AM   Available by Epic secure chat 7AM-7PM. If 7PM-7AM, please contact night-coverage.  [facility] contact information found on [email or web address].

## 2023-10-25 ENCOUNTER — Inpatient Hospital Stay: Admitting: Nurse Practitioner

## 2023-10-25 DIAGNOSIS — R0689 Other abnormalities of breathing: Secondary | ICD-10-CM | POA: Diagnosis not present

## 2023-10-25 DIAGNOSIS — J441 Chronic obstructive pulmonary disease with (acute) exacerbation: Secondary | ICD-10-CM | POA: Diagnosis not present

## 2023-10-25 LAB — BLOOD GAS, ARTERIAL
Acid-Base Excess: 15.1 mmol/L — ABNORMAL HIGH (ref 0.0–2.0)
Bicarbonate: 43.4 mmol/L — ABNORMAL HIGH (ref 20.0–28.0)
Delivery systems: POSITIVE
O2 Content: 3 L/min
O2 Saturation: 99.6 %
Patient temperature: 37
pCO2 arterial: 70 mmHg (ref 32–48)
pH, Arterial: 7.4 (ref 7.35–7.45)
pO2, Arterial: 94 mmHg (ref 83–108)

## 2023-10-25 MED ORDER — POLYETHYLENE GLYCOL 3350 17 G PO PACK
17.0000 g | PACK | Freq: Once | ORAL | Status: AC
  Administered 2023-10-25: 17 g via ORAL
  Filled 2023-10-25: qty 1

## 2023-10-25 MED ORDER — MORPHINE SULFATE (PF) 2 MG/ML IV SOLN
2.0000 mg | Freq: Once | INTRAVENOUS | Status: AC
  Administered 2023-10-25: 2 mg via INTRAVENOUS
  Filled 2023-10-25: qty 1

## 2023-10-25 NOTE — TOC Progression Note (Signed)
 Transition of Care (TOC) - Progression Note    Patient Details  Name: [name] [name]. MRN: [id number] Date of Birth: Dec 20, 1940  Transition of Care [facility]) CM/SW Contact  Truddie Hidden, RN Phone Number: 10/25/2023, 2:49 PM  Clinical Narrative:   Per Dr. Belia Heman patient would require a NIV to return home. Spoke with patient regarding NIV. She was advised he would likely be approved but NIV would not be able to be delivered before tomorrow. He stated he has caregivers at [facility]. He declined HH. Patient is anxious to return home. RNCM reiterated Dr. Clovis Fredrickson recommendation for NIV and outcomes. Emotional support provided.          Expected Discharge Plan and Services                                               Social Determinants of Health (SDOH) Interventions SDOH Screenings   Food Insecurity: No Food Insecurity (10/21/2023)  Housing: Low Risk  (10/21/2023)  Transportation Needs: No Transportation Needs (10/21/2023)  Utilities: Not At Risk (10/21/2023)  Depression (PHQ2-9): Low Risk  (07/15/2023)  Financial Resource Strain: Low Risk  (01/06/2023)  Physical Activity: Sufficiently Active (01/06/2023)  Social Connections: Socially Isolated (10/21/2023)  Stress: No Stress Concern Present (01/06/2023)  Tobacco Use: Medium Risk (10/21/2023)    Readmission Risk Interventions    10/22/2023    3:48 PM  Readmission Risk Prevention Plan  Transportation Screening Complete  PCP or Specialist Appt within 3-5 Days Complete  HRI or Home Care Consult Complete  Social Work Consult for Recovery Care Planning/Counseling Complete  Palliative Care Screening Complete  Medication Review [employer]) Complete

## 2023-10-25 NOTE — Progress Notes (Signed)
 PROGRESS NOTE  [name] [name].    DOB: 10-17-1940, 83 y.o.  ZOX:[id number]    Code Status: Limited: Do not attempt resuscitation (DNR) -DNR-LIMITED -Do Not Intubate/DNI    DOA: 10/21/2023   LOS: 4   Brief hospital course  [name] Muff. is a 82 y.o. 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   03/24: admitted to hospitalist service for COPD exacerbation Requiring BiPAP in ED.  presently: still very SOB with minimal exertion. Very deconditioned. continue tx. Pulmonology consulted and recommending morphine PRN and NIV.   Assessment & Plan  Principal Problem:   COPD (chronic obstructive pulmonary disease) (HCC) Active Problems:   Acute on chronic respiratory failure with hypoxia (HCC)   COPD with acute exacerbation (HCC)  Acute on chronic hypoxic and hypercapnic respiratory failure Acute COPD exacerbation Decompensated chronic combined respiratory acidosis and metabolic alkalosis Patient received ABG while on Bipap today to evaluate for qualifications for home NIV. TOC consulted for arrangements. He states that he does not need refills of other home medications. Can likely be discharged home tomorrow once NIV can be set up at home.  Continue BiPAP support prn Wean O2 as tolerated Doxycycline Steroids - IV solumedrol --> po prednisone  Continue home Breztri  Duoneb q6h  Xopenex neb q6h prn  Add pulmicort neb scheduled bid and increased dose Mucinex  Incentive spirometry  Ambulate as tolerated   Acute on chronic HFpEF without decompensation S/p IV diuresis  Strict I&O Continue home furosmide po. Increased dose. Appears euvolemic on exam Echocardiogram was done recently, will not repeat echo at this time   PAF with question of RVR- rate  controlled EKG showed borderline RVR, telemonitoring showed few episode of breakthrough heart rate, now resolved  Continue home Cardizem and Tikosyn Continue Eliquis   Deconditioning [name] [name]/OT evaluation   GERD PPI    No concerns based on BMI: Body mass index is 21.22 kg/m.  Body mass index is 21.22 kg/m.  VTE ppx:  apixaban (ELIQUIS) tablet 5 mg   Diet:     Diet   Diet regular Room service appropriate? Yes; Fluid consistency: Thin   Consultants: Pulmonology    Subjective 10/25/23    [name] [name] reports still feeling somewhat improved today. He had great benefit from the morphine yesterday and would like to continue this. Feels very strongly about going home today. Wife at bedside in agreement. Discussed this would be possible if we get his home NIV as recommended by pulmonology arranged.    Objective   Vitals:   10/24/23 2120 10/24/23 2123 10/25/23 0051 10/25/23 0735  BP:  109/62 114/62   Pulse:   86   Resp: 20  18   Temp:   97.8 F (36.6 C)   TempSrc:      SpO2:   100% 100%  Weight:      Height:        Intake/Output Summary (Last 24 hours) at 10/25/2023 0816 Last data filed at 10/25/2023 0453 Gross per 24 hour  Intake 240 ml  Output 800 ml  Net -560 ml   Filed Weights   10/21/23 1210  Weight: 68 kg     Physical Exam:  General: awake, alert, NAD HEENT: atraumatic, clear conjunctiva, anicteric sclera, MMM, hearing grossly normal Respiratory: normal effort.  mild inhalation wheezing. No rales or rhonchi  Cardiovascular: quick capillary refill, normal S1/S2, RRR, no JVD, murmurs Nervous: A&O x3. no gross focal neurologic deficits, normal speech Extremities: moves all equally, no edema, normal tone Skin: dry, intact, normal temperature, normal color. No rashes, lesions or ulcers on exposed skin Psychiatry: normal mood, congruent affect  Labs   I have personally reviewed the following labs and imaging studies CBC    Component Value Date/Time   WBC 4.3 10/22/2023  0713   RBC 3.84 (L) 10/22/2023 0713   HGB 11.1 (L) 10/22/2023 0713   HGB 15.3 05/02/2023 1445   HCT 34.6 (L) 10/22/2023 0713   HCT 46.9 05/02/2023 1445   PLT 302 10/22/2023 0713   PLT 226 05/02/2023 1445   MCV 90.1 10/22/2023 0713   MCV 94 05/02/2023 1445   MCH 28.9 10/22/2023 0713   MCHC 32.1 10/22/2023 0713   RDW 15.2 10/22/2023 0713   RDW 13.1 05/02/2023 1445   LYMPHSABS 0.7 09/01/2023 0535   LYMPHSABS 1.4 04/09/2018 1650   MONOABS 0.8 09/01/2023 0535   EOSABS 0.0 09/01/2023 0535   EOSABS 0.2 04/09/2018 1650   BASOSABS 0.0 09/01/2023 0535   BASOSABS 0.1 04/09/2018 1650      Latest Ref Rng & Units 10/24/2023    5:15 AM 10/23/2023    5:54 AM 10/22/2023    7:13 AM  BMP  Glucose 70 - 99 mg/dL 161  096  045   BUN 8 - 23 mg/dL 36  31  21   Creatinine 0.61 - 1.24 mg/dL 4.09  8.11  9.14   Sodium 135 - 145 mmol/L 139  135  139   Potassium 3.5 - 5.1 mmol/L 4.4  3.6  4.1   Chloride 98 - 111 mmol/L 99  98  95   CO2 22 - 32 mmol/L 32  31  31   Calcium 8.9 - 10.3 mg/dL 8.7  8.5  8.7     No results found.   Disposition Plan & Communication  Patient status: Inpatient  Admitted From: Home Planned disposition location: Home health Anticipated discharge date: 3/29 pending home NIV equipment arrangements  Family Communication: wife at bedside    Author: Leeroy Bock, DO [facility] 10/25/2023, 8:16 AM   Available by Epic secure chat 7AM-7PM. If 7PM-7AM, please contact night-coverage.  [facility] contact information found on [email or web address].

## 2023-10-25 NOTE — Progress Notes (Signed)
 Pt refused dressing change this morning. Requesting to change at 5:00PM today, added"  The nurse changed my dressing at this time." Will notify incoming shift. Will continue to monitor.

## 2023-10-25 NOTE — Consult Note (Signed)
 [facility] Pulmonary Medicine Consultation      Date: 10/25/2023,   MRN# [id number] [name] [name]. 1940/09/23     CHIEF COMPLAINT:   Acute SOB   HISTORY OF PRESENT ILLNESS  83 year old male with a past medical history of COPD end stage   Patient was previously followed by Dr. Delton Coombes, last saw him on 10/19/2022. He has a history of COPD, with chronic hypoxic respiratory failure maintained on 2-3 L via nasal cannula. He has also been followed by Dr. Delton Coombes for pulmonary nodular disease requiring serial CT scans and bronchoscopies with nodular biopsies, without an established diagnosis. Some of the biopsies had shown granulomatous inflammation. Multiple CT scans had shown waxing and waning pulmonary nodular disease. Previous lung biopsies are from 2017 (3 targets, benign lung parenchyma with mild fibrosis and anthracosis), 09/2019 (benign cytology, noncaseating granulomatous inflammation from RLL biopsy), and 03/2020 (negative cytology and pathology).   OUTPATIENT THERAPY He is maintained on Breztri for his COPD, and is also using duo-nebs and albuterol via nebulizer. Patient is chronically maintained on prednisone therapy, currently at 10 mg orally once daily. He reports being on prednisone for years.  admitted to the hospital on 05/31/2023 secondary to increased shortness of breath with increased sputum production. He was admitted for acute on chronic hypoxic respiratory failure requiring IV steroids, IV antibiotics, and nebulizers. He felt better after his discharge and his symptoms are back to baseline. Previously also admitted in January of 2024 secondary to acute on chronic hypoxic and hypercapnic respiratory failure due to RSV infection.      The patient's other medical conditions include HFpEF, Afib, and CAD s/p PCI. He is maintained on apixaban, dofetilide, digoxin, furosemide, gabapentin, and rosuvastatin. He is also chronically on prednisone 10 mg daily and is also on  gabapentin.   He has a long standing history of smoking, quit 3 years ago. He has at least 50 pack years of smoking history. He used to work in [employer] and denies occupational exposures.     Patient currently admitted for severe  COPD exacerbation Systemic steroids, NEB THERAPY AND OXYGEN THERAPY Increased WOB On NEBS, STEROIDS, OXYGEN THERAPY Plan for assessment for noninvasive ventilation     Patient has severe end stage COPD with FEV1<50% Patient continues to exhibit signs of hypercapnia associated with chronic respiratory failure secondary to severe end-stage COPD.    Patient requires the use of NIV both nightly and daytime to help with exacerbation periods. The use of NIV will treat patient's high PCO2 67 levels and can reduce the risk of exacerbations in future hospitalizations when used at night and during the day.  Patient will need these advanced settings in conjunction with his current medication regimen :BiPAP with backup rate has been tried and failed.  Failure to have NIV available for use over 24hrs  Could lead to severe respiratory failure and cardiaca arrest and death  In my opinion the patient will benefit from nocturnal NIV likely decreasing the need for frequent readmissions.   Noninvasive ventilation will also decrease morbidity and mortality in this patient due to increasing carbon dioxide levels. The use of the NIV will treat patient's high PCO2 levels and can reduce risk of exacerbations in future hospitalizations when used in a mandatory fashion at night and as needed during the day.  Patient will need these advanced settings in conjunction with her current medication regimen.   Patient can maintain and clear their own airway.Patient is able to clear secretions and protect  airway.   Failure to have NIV available for home use could lead to encephalopathy, cardiac arrest and death.   Current recommendation is for adapt VPAP or AVS, auto if possible.         Hospital Course 3/27 SOB, wheezing coughing Morphine helped tremendously  3/28 less WOB but still with cough and wheezing Wants more morphine     PAST MEDICAL HISTORY   Past Medical History:  Diagnosis Date   Abdominal discomfort 12/07/2020   Acquired trigger finger of right middle finger 02/04/2020   Acute prostatitis 04/08/2018   Alcohol abuse    Alcohol dependence (HCC) 12/07/2020   Anal or rectal pain 04/08/2018   Annual physical exam 12/07/2020   Anorectal disorder 12/07/2020   Arthritis    Atherosclerotic heart disease of native coronary artery without angina pectoris 04/08/2018   a. 03/2018 PCI: LM nl, LAD 20m, LCX nl, OM1 70p (2.75x8 Synergy DES), 70m (2.5x24 Synergy DES), RCA 25p, RPL1 50. EF 55-65%.   Atrial flutter (HCC)    a. 07/2022 in setting of hospitalization for resp illness/RSV. CHA2DS2VASc = 4-->eliquis; b. 08/2022 s/p DCCV (100J); c. 09/26/2022 Recurrent Aflutter.   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 02/2022 Carotid U/S: 1-39% bilat ICA stenoses.   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   Contracture of palmar fascia 12/07/2020   COPD (chronic obstructive pulmonary disease) (HCC)    Corn of toe 12/07/2020   Corns and callosities 04/09/2018   Coronary artery disease    2019 with stents   Coronary atherosclerosis 05/07/2018   Degenerative disc disease, 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   Degenerative disc disease, 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   Diarrhea 04/09/2018   Double vision with both eyes open  12/07/2020   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   Dysphagia 08/14/2018   Dyspnea    ED (erectile dysfunction)    ED (erectile dysfunction) of organic origin 12/07/2020   Elevated prostate specific antigen (PSA) 04/08/2018   Elevated PSA    Encounter for screening for other disorder 12/07/2020   Enlarged prostate without lower urinary tract symptoms (luts) 04/09/2018   Enthesopathy 12/07/2020   Essential (primary) hypertension 04/08/2018   Ex-smoker 04/09/2018   Exertional dyspnea 04/02/2018   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 07/2022 Echo: EF 65-70%, no rwma, mild LVH, nl RV fxn.   Hyperlipidemia    Hypertension    Hypoxemia 12/07/2020   Kidney cysts    Left knee pain 12/07/2020   Low back pain 04/08/2018   Male erectile dysfunction 04/09/2018   Mixed hyperlipidemia 04/08/2018   Nocturia more than twice per night 04/09/2018   Osteoarthritis of first carpometacarpal joint 04/08/2018   Osteoarthritis of knee 12/07/2020   Other long term (current) drug therapy 12/07/2020   Pain due to onychomycosis of toenail of left foot 07/20/2019   Pain in joint 04/09/2018   Pain in knee  04/09/2018   Pain in unspecified knee 12/07/2020   Pain of finger 04/09/2018   Palpitations    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HCC) 12/07/2020   Personal history of colonic polyps 12/07/2020   Pneumonia    Pneumothorax 04/12/2020   Polypharmacy 04/09/2018   Porokeratosis 01/13/2019   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HCC)    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   Pulmonary nodules 06/08/2016   Pure hypercholesterolemia 12/07/2020   Renal cyst 12/07/2020   Sleep disorder 04/08/2018   Status post bronchoscopy 04/12/2020   Tear of rotator cuff 04/09/2018   Tobacco user 12/07/2020   Uncomplicated  alcohol dependence (HCC) 04/09/2018   Unspecified rotator cuff tear or rupture of unspecified shoulder, not specified as traumatic 12/07/2020   Unspecified tear of unspecified meniscus, current injury, unspecified knee, initial encounter 12/07/2020   Visual disturbance 12/07/2020   Vitamin D deficiency 04/08/2018     SURGICAL HISTORY   Past Surgical History:  Procedure Laterality Date   BRONCHIAL BIOPSY  10/13/2019   Procedure: BRONCHIAL BIOPSIES;  Surgeon: Leslye Peer, MD;  Location: [facility] ENDOSCOPY;  Service: Pulmonary;;   BRONCHIAL BIOPSY  04/12/2020   Procedure: BRONCHIAL BIOPSIES;  Surgeon: Leslye Peer, MD;  Location: [facility] ENDOSCOPY;  Service: Pulmonary;;   BRONCHIAL BRUSHINGS  10/13/2019   Procedure: BRONCHIAL BRUSHINGS;  Surgeon: Leslye Peer, MD;  Location: [facility] ENDOSCOPY;  Service: Pulmonary;;   BRONCHIAL BRUSHINGS  04/12/2020   Procedure: BRONCHIAL BRUSHINGS;  Surgeon: Leslye Peer, MD;  Location: [facility] ENDOSCOPY;  Service: Pulmonary;;   BRONCHIAL NEEDLE ASPIRATION BIOPSY  10/13/2019   Procedure: BRONCHIAL NEEDLE ASPIRATION BIOPSIES;  Surgeon: Leslye Peer, MD;  Location: [facility] ENDOSCOPY;  Service: Pulmonary;;   BRONCHIAL NEEDLE ASPIRATION BIOPSY  04/12/2020   Procedure: BRONCHIAL NEEDLE ASPIRATION BIOPSIES;  Surgeon: Leslye Peer, MD;  Location: [facility] ENDOSCOPY;  Service: Pulmonary;;   BRONCHIAL WASHINGS  10/13/2019   Procedure: BRONCHIAL WASHINGS;  Surgeon: Leslye Peer, MD;  Location: [facility] ENDOSCOPY;  Service: Pulmonary;;   BRONCHIAL WASHINGS  04/12/2020   Procedure: BRONCHIAL WASHINGS;  Surgeon: Leslye Peer, MD;  Location: [facility] ENDOSCOPY;  Service: Pulmonary;;   CARDIAC CATHETERIZATION  2002   50% RCA   CARDIOVERSION N/A 09/17/2022   Procedure: CARDIOVERSION;  Surgeon: Iran Ouch, MD;  Location: [facility] ORS;  Service: Cardiovascular;  Laterality: N/A;   CATARACT EXTRACTION, BILATERAL     CORONARY STENT INTERVENTION N/A 04/15/2018   Procedure: CORONARY STENT  INTERVENTION;  Surgeon: Corky Crafts, MD;  Location: [facility] INVASIVE CV LAB;  Service: Cardiovascular;  Laterality: N/A;  om1   EYE SURGERY     KNEE ARTHROSCOPY     LEFT HEART CATH AND CORONARY ANGIOGRAPHY N/A 04/15/2018   Procedure: LEFT HEART CATH AND CORONARY ANGIOGRAPHY;  Surgeon: Corky Crafts, MD;  Location: [facility] INVASIVE CV LAB;  Service: Cardiovascular;  Laterality: N/A;   MULTIPLE TOOTH EXTRACTIONS     TONSILLECTOMY     VIDEO BRONCHOSCOPY  04/12/2020   VIDEO BRONCHOSCOPY WITH ENDOBRONCHIAL NAVIGATION N/A 07/26/2016   Procedure: VIDEO BRONCHOSCOPY WITH ENDOBRONCHIAL NAVIGATION;  Surgeon: Leslye Peer, MD;  Location: [facility] OR;  Service: Thoracic;  Laterality: N/A;   VIDEO BRONCHOSCOPY WITH ENDOBRONCHIAL NAVIGATION N/A 10/13/2019   Procedure: [facility] AND VIDEO BRONCHOSCOPY WITH ENDOBRONCHIAL NAVIGATION;  Surgeon: Leslye Peer, MD;  Location: [facility] ENDOSCOPY;  Service: Pulmonary;  Laterality: N/A;   VIDEO BRONCHOSCOPY WITH ENDOBRONCHIAL NAVIGATION N/A 04/12/2020   Procedure: VIDEO BRONCHOSCOPY WITH ENDOBRONCHIAL NAVIGATION;  Surgeon: Leslye Peer, MD;  Location: [facility]  ENDOSCOPY;  Service: Pulmonary;  Laterality: N/A;     FAMILY HISTORY   Family History  Problem Relation Age of Onset   Hypertension Mother    Alzheimer's disease Mother      SOCIAL HISTORY   Social History   Tobacco Use   Smoking status: Former    Current packs/day: 0.00    Average packs/day: 0.8 packs/day for 68.0 years (51.0 ttl pk-yrs)    Types: Cigarettes    Start date: 62    Quit date: 07/13/2020    Years since quitting: 3.2    Passive exposure: Never   Smokeless tobacco: Never   Tobacco comments:    Recent Quit    Vaping Use   Vaping status: Former  Substance Use Topics   Alcohol use: No    Alcohol/week: 0.0 standard drinks of alcohol    Comment: quit drinking 2014   Drug use: No     MEDICATIONS    Home Medication:    Current Medication:  Current Facility-Administered  Medications:    acetaminophen (TYLENOL) tablet 650 mg, 650 mg, Oral, Q6H PRN **OR** acetaminophen (TYLENOL) suppository 650 mg, 650 mg, Rectal, Q6H PRN, Emeline General, MD   apixaban Everlene Balls) tablet 5 mg, 5 mg, Oral, BID, Mikey College T, MD, 5 mg at 10/24/23 2124   budesonide (PULMICORT) nebulizer solution 0.5 mg, 0.5 mg, Nebulization, BID, Jamelle Rushing L, MD, 0.5 mg at 10/25/23 2956   clonazePAM (KLONOPIN) tablet 0.5 mg, 0.5 mg, Oral, QHS, Mikey College T, MD, 0.5 mg at 10/24/23 2125   diltiazem (CARDIZEM CD) 24 hr capsule 120 mg, 120 mg, Oral, BID, Mikey College T, MD, 120 mg at 10/24/23 2125   dofetilide (TIKOSYN) capsule 250 mcg, 250 mcg, Oral, BID, Mikey College T, MD, 250 mcg at 10/24/23 2126   doxycycline (VIBRA-TABS) tablet 100 mg, 100 mg, Oral, Q12H, Mikey College T, MD, 100 mg at 10/24/23 2124   feeding supplement (ENSURE ENLIVE / ENSURE PLUS) liquid 237 mL, 237 mL, Oral, BID BM, Leeroy Bock, MD   finasteride (PROSCAR) tablet 5 mg, 5 mg, Oral, Daily, Chipper Herb, Ping T, MD, 5 mg at 10/24/23 0902   fluticasone (FLONASE) 50 MCG/ACT nasal spray 2 spray, 2 spray, Each Nare, Daily, Mansy, Jan A, MD, 2 spray at 10/24/23 2130   furosemide (LASIX) tablet 40 mg, 40 mg, Oral, Daily, Leeroy Bock, MD   gabapentin (NEURONTIN) capsule 200 mg, 200 mg, Oral, QHS, Mikey College T, MD, 200 mg at 10/24/23 2124   guaiFENesin (MUCINEX) 12 hr tablet 1,200 mg, 1,200 mg, Oral, BID, Sunnie Nielsen, DO, 1,200 mg at 10/24/23 2124   ipratropium-albuterol (DUONEB) 0.5-2.5 (3) MG/3ML nebulizer solution 3 mL, 3 mL, Nebulization, Q6H, Damali Broadfoot, MD, 3 mL at 10/25/23 0733   liver oil-zinc oxide (DESITIN) 40 % ointment, , Topical, BID, Mikey College T, MD, Given at 10/24/23 2200   morphine (PF) 2 MG/ML injection 2 mg, 2 mg, Intravenous, Once, Erin Fulling, MD   oxyCODONE (Oxy IR/ROXICODONE) immediate release tablet 5-10 mg, 5-10 mg, Oral, Q6H PRN, Sunnie Nielsen, DO, 10 mg at 10/22/23 1235   pantoprazole  (PROTONIX) EC tablet 40 mg, 40 mg, Oral, Daily, Mikey College T, MD, 40 mg at 10/24/23 0902   polyethylene glycol (MIRALAX / GLYCOLAX) packet 17 g, 17 g, Oral, Daily PRN, Mikey College T, MD   polyethylene glycol (MIRALAX / GLYCOLAX) packet 17 g, 17 g, Oral, Once, Leeroy Bock, MD   [COMPLETED] methylPREDNISolone sodium succinate (SOLU-MEDROL) 40 mg/mL injection  40 mg, 40 mg, Intravenous, Q12H, 40 mg at 10/22/23 1033 **FOLLOWED BY** predniSONE (DELTASONE) tablet 40 mg, 40 mg, Oral, Q breakfast, Mikey College T, MD, 40 mg at 10/25/23 0855   rosuvastatin (CRESTOR) tablet 20 mg, 20 mg, Oral, QHS, Mikey College T, MD, 20 mg at 10/24/23 2124   senna (SENOKOT) tablet 17.2 mg, 2 tablet, Oral, QHS, Mikey College T, MD, 17.2 mg at 10/24/23 2124   sodium chloride flush (NS) 0.9 % injection 3 mL, 3 mL, Intravenous, Q12H, Zhang, Ping T, MD, 3 mL at 10/24/23 2200   sodium chloride flush (NS) 0.9 % injection 3 mL, 3 mL, Intravenous, PRN, Mikey College T, MD   umeclidinium-vilanterol (ANORO ELLIPTA) 62.5-25 MCG/ACT 1 puff, 1 puff, Inhalation, Daily, Sunnie Nielsen, DO, 1 puff at 10/25/23 0856    ALLERGIES   Flagyl [metronidazole]  IMAGING    DG Chest Port 1 View Result Date: 10/21/2023 CLINICAL DATA:  04540 COPD (chronic obstructive pulmonary disease) (HCC) 98119 EXAM: PORTABLE CHEST 1 VIEW COMPARISON:  08/23/2023, 05/31/2023 FINDINGS: The heart size and mediastinal contours are within normal limits. Hyperinflated lungs with chronically coarsened interstitial markings bilaterally. No new focal airspace consolidation. No pleural effusion or pneumothorax. The visualized skeletal structures are unremarkable. IMPRESSION: Chronic lung changes without acute cardiopulmonary findings. Electronically Signed   By: Duanne Guess D.O.   On: 10/21/2023 14:25   BP (!) 130/55 (BP Location: Left Arm)   Pulse 89   Temp 97.8 F (36.6 C)   Resp 18   Ht 5' 10.5" (1.791 m)   Wt 68 kg   SpO2 100%   BMI 21.22 kg/m       Review of Systems: Gen:  Denies  fever, sweats, chills weight loss  HEENT: Denies blurred vision, double vision, ear pain, eye pain, hearing loss, nose bleeds, sore throat Cardiac:  No dizziness, chest pain or heaviness, chest tightness,edema, No JVD Resp:   No cough, -sputum production, -shortness of breath,-wheezing, -hemoptysis,  Other:  All other systems negative   Physical Examination:   General Appearance: No distress  EYES PERRLA, EOM intact.   NECK Supple, No JVD Pulmonary: normal breath sounds, No wheezing.  CardiovascularNormal S1,S2.  No m/r/g.   Abdomen: Benign, Soft, non-tender. Neurology UE/LE 5/5 strength, no focal deficits Ext pulses intact, cap refill intact ALL OTHER ROS ARE NEGATIVE   ASSESSMENT/PLAN   83 yo white male with acute COPD exacerbation with end stage COPD with chronic hypoxic and hypercapnic resp failure with recurrent bouts of COPD exacerbation   SEVERE COPD EXACERBATION -continue  steroids as prescribed -continue NEB THERAPY as prescribed -morphine as needed -wean fio2 as needed and tolerated Continue lasix as tolerated  Assess for non-invasive ventilation-TOC consultation    Patient  satisfied with Plan of action and management. All questions answered   I spent a total of 35 minutes reviewing chart data, face-to-face evaluation with the patient, counseling and coordination of care as detailed above.     Lucie Leather, M.D.  [facility]  Medical Director [facility] Medical Director [facility] Cardio-Pulmonary Department

## 2023-10-25 NOTE — Plan of Care (Signed)
  Problem: Education: Goal: Knowledge of General Education information will improve Description: Including pain rating scale, medication(s)/side effects and non-pharmacologic comfort measures Outcome: Progressing   Problem: Clinical Measurements: Goal: Respiratory complications will improve Outcome: Progressing   Problem: Clinical Measurements: Goal: Cardiovascular complication will be avoided Outcome: Progressing   Problem: Elimination: Goal: Will not experience complications related to urinary retention Outcome: Progressing   Problem: Pain Managment: Goal: General experience of comfort will improve and/or be controlled Outcome: Progressing   Problem: Safety: Goal: Ability to remain free from injury will improve Outcome: Progressing

## 2023-10-25 NOTE — Care Management Important Message (Signed)
 Important Message  Patient Details  Name: [name] [name]. MRN: [id number] Date of Birth: 13-Aug-1940   Important Message Given:  Yes - Medicare IM     Bernadette Hoit 10/25/2023, 10:37 AM

## 2023-10-26 DIAGNOSIS — R0689 Other abnormalities of breathing: Secondary | ICD-10-CM | POA: Diagnosis not present

## 2023-10-26 DIAGNOSIS — J441 Chronic obstructive pulmonary disease with (acute) exacerbation: Secondary | ICD-10-CM | POA: Diagnosis not present

## 2023-10-26 LAB — BASIC METABOLIC PANEL WITH GFR
Anion gap: 4 — ABNORMAL LOW (ref 5–15)
BUN: 29 mg/dL — ABNORMAL HIGH (ref 8–23)
CO2: 37 mmol/L — ABNORMAL HIGH (ref 22–32)
Calcium: 8.6 mg/dL — ABNORMAL LOW (ref 8.9–10.3)
Chloride: 98 mmol/L (ref 98–111)
Creatinine, Ser: 0.72 mg/dL (ref 0.61–1.24)
GFR, Estimated: >60 mL/min (ref >60)
Glucose, Bld: 107 mg/dL — ABNORMAL HIGH (ref 70–99)
Potassium: 4.6 mmol/L (ref 3.5–5.1)
Sodium: 139 mmol/L (ref 135–145)

## 2023-10-26 LAB — CULTURE, RESPIRATORY W GRAM STAIN: Culture: NORMAL

## 2023-10-26 LAB — CBC
HCT: 32.9 % — ABNORMAL LOW (ref 39.0–52.0)
Hemoglobin: 10.5 g/dL — ABNORMAL LOW (ref 13.0–17.0)
MCH: 29 pg (ref 26.0–34.0)
MCHC: 31.9 g/dL (ref 30.0–36.0)
MCV: 90.9 fL (ref 80.0–100.0)
Platelets: 337 10*3/uL (ref 150–400)
RBC: 3.62 MIL/uL — ABNORMAL LOW (ref 4.22–5.81)
RDW: 14.8 % (ref 11.5–15.5)
WBC: 9.7 10*3/uL (ref 4.0–10.5)
nRBC: 0 % (ref 0.0–0.2)

## 2023-10-26 LAB — CULTURE, BLOOD (ROUTINE X 2)
Culture: NO GROWTH
Culture: NO GROWTH
Special Requests: ADEQUATE
Special Requests: ADEQUATE

## 2023-10-26 LAB — MAGNESIUM: Magnesium: 2.1 mg/dL (ref 1.7–2.4)

## 2023-10-26 LAB — PHOSPHORUS: Phosphorus: 3.1 mg/dL (ref 2.5–4.6)

## 2023-10-26 MED ORDER — IPRATROPIUM-ALBUTEROL 0.5-2.5 (3) MG/3ML IN SOLN
3.0000 mL | Freq: Three times a day (TID) | RESPIRATORY_TRACT | Status: DC
  Administered 2023-10-27 – 2023-10-29 (×8): 3 mL via RESPIRATORY_TRACT
  Filled 2023-10-26 (×8): qty 3

## 2023-10-26 MED ORDER — MORPHINE SULFATE (PF) 2 MG/ML IV SOLN
2.0000 mg | Freq: Once | INTRAVENOUS | Status: AC
  Administered 2023-10-26: 2 mg via INTRAVENOUS
  Filled 2023-10-26: qty 1

## 2023-10-26 NOTE — Plan of Care (Signed)
  Problem: Education: Goal: Knowledge of General Education information will improve Description: Including pain rating scale, medication(s)/side effects and non-pharmacologic comfort measures Outcome: Progressing   Problem: Health Behavior/Discharge Planning: Goal: Ability to manage health-related needs will improve Outcome: Progressing   Problem: Clinical Measurements: Goal: Ability to maintain clinical measurements within normal limits will improve Outcome: Progressing Goal: Will remain free from infection Outcome: Progressing Goal: Diagnostic test results will improve Outcome: Progressing Goal: Respiratory complications will improve Outcome: Progressing Goal: Cardiovascular complication will be avoided Outcome: Progressing   Problem: Activity: Goal: Risk for activity intolerance will decrease Outcome: Progressing   Problem: Nutrition: Goal: Adequate nutrition will be maintained Outcome: Progressing   Problem: Coping: Goal: Level of anxiety will decrease Outcome: Progressing   Problem: Elimination: Goal: Will not experience complications related to bowel motility Outcome: Progressing Goal: Will not experience complications related to urinary retention Outcome: Progressing   Problem: Pain Managment: Goal: General experience of comfort will improve and/or be controlled Outcome: Progressing   Problem: Safety: Goal: Ability to remain free from injury will improve Outcome: Progressing   Problem: Skin Integrity: Goal: Risk for impaired skin integrity will decrease Outcome: Progressing   Problem: Education: Goal: Knowledge of disease or condition will improve Outcome: Progressing Goal: Knowledge of the prescribed therapeutic regimen will improve Outcome: Progressing Goal: Individualized Educational Video(s) Outcome: Progressing   Problem: Activity: Goal: Ability to tolerate increased activity will improve Outcome: Progressing Goal: Will verbalize the  importance of balancing activity with adequate rest periods Outcome: Progressing   Problem: Respiratory: Goal: Ability to maintain a clear airway will improve Outcome: Progressing Goal: Levels of oxygenation will improve Outcome: Progressing Goal: Ability to maintain adequate ventilation will improve Outcome: Progressing   Problem: Education: Goal: Knowledge of disease or condition will improve Outcome: Progressing Goal: Understanding of medication regimen will improve Outcome: Progressing Goal: Individualized Educational Video(s) Outcome: Progressing   Problem: Activity: Goal: Ability to tolerate increased activity will improve Outcome: Progressing   Problem: Cardiac: Goal: Ability to achieve and maintain adequate cardiopulmonary perfusion will improve Outcome: Progressing   Problem: Health Behavior/Discharge Planning: Goal: Ability to safely manage health-related needs after discharge will improve Outcome: Progressing

## 2023-10-26 NOTE — TOC Progression Note (Addendum)
 Transition of Care (TOC) - Progression Note    Patient Details  Name: [name] [name]. MRN: [id number] Date of Birth: 1940-11-16  Transition of Care [facility]) CM/SW Contact  Bing Quarry, RN Phone Number: 10/26/2023, 9:39 AM  Clinical Narrative: 10/26/23: Left voice mail on [employer] representative. Marthann Schiller, at [phone] regarding updates on NIV status, at provider request, and after reviewing prior RN CM note from 10/25/23.   Gabriel Cirri MSN RN CM  RN Case Manager [place]  Transitions of Care Direct Dial: [phone] (Weekends Only) [facility] Main Office Phone: [phone] [facility] Fax: [phone] [email or web address]   Update: 1000 am; Mitch from [employer] returned call to RN CM. Will email order form for provider to fill out and return and then will be submitted to their compliance department for insurance authorization. In the interim, he stated that a specific BiPAP will be used until authorization is received from [facility]. Order from received from [facility] and printed. Provider nearby and will come sign. RN CM will email back to [employer].    Gabriel Cirri MSN RN CM  RN Case Manager [place]  Transitions of Care Direct Dial: [phone] (Weekends Only) [facility] Main Office Phone: [phone] [facility] Fax: [phone] [email or web address]        Expected Discharge Plan and Services                                               Social Determinants of Health (SDOH) Interventions SDOH Screenings   Food Insecurity: No Food Insecurity (10/21/2023)  Housing: Low Risk  (10/21/2023)  Transportation Needs: No Transportation Needs (10/21/2023)  Utilities: Not At Risk (10/21/2023)  Depression (PHQ2-9): Low Risk  (07/15/2023)  Financial Resource Strain: Low Risk  (01/06/2023)  Physical Activity: Sufficiently Active (01/06/2023)  Social Connections: Socially Isolated (10/21/2023)  Stress: No Stress Concern Present (01/06/2023)  Tobacco Use: Medium Risk (10/21/2023)    Readmission Risk  Interventions    10/22/2023    3:48 PM  Readmission Risk Prevention Plan  Transportation Screening Complete  PCP or Specialist Appt within 3-5 Days Complete  HRI or Home Care Consult Complete  Social Work Consult for Recovery Care Planning/Counseling Complete  Palliative Care Screening Complete  Medication Review [employer]) Complete

## 2023-10-26 NOTE — Plan of Care (Signed)
    Patient has severe end stage COPD with FEV1<50% Patient continues to exhibit signs of hypercapnia associated with chronic respiratory failure secondary to severe end-stage COPD.    Patient requires the use of NIV both nightly and daytime to help with exacerbation periods. The use of NIV will treat patient's high PCO2 67 levels and can reduce the risk of exacerbations in future hospitalizations when used at night and during the day.  Patient will need these advanced settings in conjunction with his current medication regimen :BiPAP with backup rate has been tried and failed.  Failure to have NIV available for use over 24hrs  Could lead to severe respiratory failure and cardiaca arrest and death  In my opinion the patient will benefit from nocturnal NIV likely decreasing the need for frequent readmissions.   Noninvasive ventilation will also decrease morbidity and mortality in this patient due to increasing carbon dioxide levels. The use of the NIV will treat patient's high PCO2 levels and can reduce risk of exacerbations in future hospitalizations when used in a mandatory fashion at night and as needed during the day.  Patient will need these advanced settings in conjunction with her current medication regimen.   Patient can maintain and clear their own airway.Patient is able to clear secretions and protect airway.   Failure to have NIV available for home use could lead to encephalopathy, cardiac arrest and death.   Current recommendation is for adapt VPAP or AVS, auto if possible.         Lucie Leather, M.D.  [facility]  Medical Director [facility] Medical Director [facility] Cardio-Pulmonary Department

## 2023-10-26 NOTE — Progress Notes (Signed)
 [facility] [place] Consultation      Date: 10/26/2023,   MRN# [id number] [name] [name]. 1941/02/25     CHIEF COMPLAINT:   Acute SOB   HISTORY OF PRESENT ILLNESS  83 year old male with a past medical history of COPD end stage   Patient was previously followed by Dr. Delton Coombes, last saw him on 10/19/2022. He has a history of COPD, with chronic hypoxic respiratory failure maintained on 2-3 L via nasal cannula. He has also been followed by Dr. Delton Coombes for pulmonary nodular disease requiring serial CT scans and bronchoscopies with nodular biopsies, without an established diagnosis. Some of the biopsies had shown granulomatous inflammation. Multiple CT scans had shown waxing and waning pulmonary nodular disease. Previous lung biopsies are from 2017 (3 targets, benign lung parenchyma with mild fibrosis and anthracosis), 09/2019 (benign cytology, noncaseating granulomatous inflammation from RLL biopsy), and 03/2020 (negative cytology and pathology).   OUTPATIENT THERAPY He is maintained on Breztri for his COPD, and is also using duo-nebs and albuterol via nebulizer. Patient is chronically maintained on prednisone therapy, currently at 10 mg orally once daily. He reports being on prednisone for years.  admitted to the hospital on 05/31/2023 secondary to increased shortness of breath with increased sputum production. He was admitted for acute on chronic hypoxic respiratory failure requiring IV steroids, IV antibiotics, and nebulizers. He felt better after his discharge and his symptoms are back to baseline. Previously also admitted in January of 2024 secondary to acute on chronic hypoxic and hypercapnic respiratory failure due to RSV infection.      The patient's other medical conditions include HFpEF, Afib, and CAD s/p PCI. He is maintained on apixaban, dofetilide, digoxin, furosemide, gabapentin, and rosuvastatin. He is also chronically on prednisone 10 mg daily and is also on  gabapentin.   He has a long standing history of smoking, quit 3 years ago. He has at least 50 pack years of smoking history. He used to work in [employer] and denies occupational exposures.     Patient currently admitted for severe  COPD exacerbation Systemic steroids, NEB THERAPY AND OXYGEN THERAPY Increased WOB On NEBS, STEROIDS, OXYGEN THERAPY Plan for assessment for noninvasive ventilation  3/27 SOB, wheezing coughing Morphine helped tremendously  3/28 less WOB but still with cough and wheezing Wants more morphine  3/29 increased WOB, placed back on BiPAP Will give morphine     PAST MEDICAL HISTORY   Past Medical History:  Diagnosis Date   Abdominal discomfort 12/07/2020   Acquired trigger finger of right middle finger 02/04/2020   Acute prostatitis 04/08/2018   Alcohol abuse    Alcohol dependence (HCC) 12/07/2020   Anal or rectal pain 04/08/2018   Annual physical exam 12/07/2020   Anorectal disorder 12/07/2020   Arthritis    Atherosclerotic heart disease of native coronary artery without angina pectoris 04/08/2018   a. 03/2018 PCI: LM nl, LAD 12m, LCX nl, OM1 70p (2.75x8 Synergy DES), 48m (2.5x24 Synergy DES), RCA 25p, RPL1 50. EF 55-65%.   Atrial flutter (HCC)    a. 07/2022 in setting of hospitalization for resp illness/RSV. CHA2DS2VASc = 4-->eliquis; b. 08/2022 s/p DCCV (100J); c. 09/26/2022 Recurrent Aflutter.   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 02/2022 Carotid U/S: 1-39% bilat ICA stenoses.   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   Contracture of palmar fascia 12/07/2020   COPD (chronic obstructive pulmonary disease) (HCC)    Corn of toe 12/07/2020   Corns and callosities 04/09/2018   Coronary artery disease    2019 with stents   Coronary atherosclerosis 05/07/2018   Degenerative disc disease, 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   Degenerative disc disease, 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   Diarrhea 04/09/2018   Double vision with both eyes open 12/07/2020   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   Dysphagia 08/14/2018   Dyspnea    ED (erectile dysfunction)    ED (erectile dysfunction) of organic origin 12/07/2020   Elevated prostate specific antigen (PSA) 04/08/2018   Elevated PSA    Encounter for screening for other disorder 12/07/2020   Enlarged prostate without lower urinary tract symptoms (luts) 04/09/2018   Enthesopathy 12/07/2020   Essential (primary) hypertension 04/08/2018   Ex-smoker 04/09/2018   Exertional dyspnea 04/02/2018   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 07/2022 Echo: EF 65-70%, no rwma, mild LVH, nl RV fxn.   Hyperlipidemia    Hypertension    Hypoxemia 12/07/2020   Kidney cysts    Left knee pain 12/07/2020   Low back pain 04/08/2018   Male erectile dysfunction 04/09/2018   Mixed hyperlipidemia 04/08/2018   Nocturia more than twice per night 04/09/2018   Osteoarthritis of first carpometacarpal joint 04/08/2018   Osteoarthritis of knee 12/07/2020   Other long term (current) drug therapy 12/07/2020   Pain due to onychomycosis of toenail of left foot 07/20/2019   Pain in joint 04/09/2018   Pain in knee 04/09/2018   Pain in unspecified knee 12/07/2020   Pain of finger 04/09/2018   Palpitations    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HCC) 12/07/2020   Personal  history of colonic polyps 12/07/2020   Pneumonia    Pneumothorax 04/12/2020   Polypharmacy 04/09/2018   Porokeratosis 01/13/2019   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HCC)    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   Pulmonary nodules 06/08/2016   Pure hypercholesterolemia 12/07/2020   Renal cyst 12/07/2020   Sleep disorder 04/08/2018   Status post bronchoscopy 04/12/2020   Tear of rotator cuff 04/09/2018   Tobacco user 12/07/2020   Uncomplicated alcohol dependence (HCC) 04/09/2018   Unspecified rotator cuff tear or rupture of unspecified shoulder, not specified as traumatic 12/07/2020   Unspecified tear of unspecified meniscus, current injury, unspecified knee, initial encounter 12/07/2020   Visual disturbance 12/07/2020   Vitamin D deficiency 04/08/2018     SURGICAL HISTORY   Past Surgical History:  Procedure Laterality Date   BRONCHIAL BIOPSY  10/13/2019   Procedure: BRONCHIAL BIOPSIES;  Surgeon: Leslye Peer, MD;  Location: [facility] ENDOSCOPY;  Service: Pulmonary;;   BRONCHIAL BIOPSY  04/12/2020   Procedure: BRONCHIAL BIOPSIES;  Surgeon: Leslye Peer, MD;  Location[facility] ENDOSCOPY;  Service: Pulmonary;;   BRONCHIAL BRUSHINGS  10/13/2019   Procedure: BRONCHIAL BRUSHINGS;  Surgeon: Leslye Peer, MD;  Location: [facility] ENDOSCOPY;  Service: Pulmonary;;   BRONCHIAL BRUSHINGS  04/12/2020   Procedure: BRONCHIAL BRUSHINGS;  Surgeon: Leslye Peer, MD;  Location: [facility] ENDOSCOPY;  Service: Pulmonary;;   BRONCHIAL NEEDLE ASPIRATION BIOPSY  10/13/2019   Procedure: BRONCHIAL NEEDLE ASPIRATION BIOPSIES;  Surgeon: Leslye Peer, MD;  Location: [facility] ENDOSCOPY;  Service: Pulmonary;;   BRONCHIAL NEEDLE ASPIRATION BIOPSY  04/12/2020   Procedure: BRONCHIAL NEEDLE ASPIRATION BIOPSIES;  Surgeon: Leslye Peer, MD;  Location: [facility] ENDOSCOPY;  Service: Pulmonary;;   BRONCHIAL WASHINGS  10/13/2019   Procedure: BRONCHIAL WASHINGS;  Surgeon: Leslye Peer, MD;  Location: [facility] ENDOSCOPY;  Service: Pulmonary;;   BRONCHIAL WASHINGS   04/12/2020   Procedure: BRONCHIAL WASHINGS;  Surgeon: Leslye Peer, MD;  Location: [facility] ENDOSCOPY;  Service: Pulmonary;;   CARDIAC CATHETERIZATION  2002   50% RCA   CARDIOVERSION N/A 09/17/2022   Procedure: CARDIOVERSION;  Surgeon: Iran Ouch, MD;  Location: [facility] ORS;  Service: Cardiovascular;  Laterality: N/A;   CATARACT EXTRACTION, BILATERAL     CORONARY STENT INTERVENTION N/A 04/15/2018   Procedure: CORONARY STENT INTERVENTION;  Surgeon: Corky Crafts, MD;  Location: [facility] INVASIVE CV LAB;  Service: Cardiovascular;  Laterality: N/A;  om1   EYE SURGERY     KNEE ARTHROSCOPY     LEFT HEART CATH AND CORONARY ANGIOGRAPHY N/A 04/15/2018   Procedure: LEFT HEART CATH AND CORONARY ANGIOGRAPHY;  Surgeon: Corky Crafts, MD;  Location: [facility] INVASIVE CV LAB;  Service: Cardiovascular;  Laterality: N/A;   MULTIPLE TOOTH EXTRACTIONS     TONSILLECTOMY     VIDEO BRONCHOSCOPY  04/12/2020   VIDEO BRONCHOSCOPY WITH ENDOBRONCHIAL NAVIGATION N/A 07/26/2016   Procedure: VIDEO BRONCHOSCOPY WITH ENDOBRONCHIAL NAVIGATION;  Surgeon: Leslye Peer, MD;  Location: [facility] OR;  Service: Thoracic;  Laterality: N/A;   VIDEO BRONCHOSCOPY WITH ENDOBRONCHIAL NAVIGATION N/A 10/13/2019   Procedure: [facility] AND VIDEO BRONCHOSCOPY WITH ENDOBRONCHIAL NAVIGATION;  Surgeon: Leslye Peer, MD;  Location: [facility] ENDOSCOPY;  Service: Pulmonary;  Laterality: N/A;   VIDEO BRONCHOSCOPY WITH ENDOBRONCHIAL NAVIGATION N/A 04/12/2020   Procedure: VIDEO BRONCHOSCOPY WITH ENDOBRONCHIAL NAVIGATION;  Surgeon: Leslye Peer, MD;  Location: [facility] ENDOSCOPY;  Service: Pulmonary;  Laterality: N/A;     FAMILY HISTORY   Family History  Problem Relation Age of Onset   Hypertension Mother    Alzheimer's disease Mother      SOCIAL HISTORY   Social History   Tobacco Use   Smoking status: Former    Current packs/day: 0.00    Average packs/day: 0.8 packs/day for 68.0 years (51.0 ttl pk-yrs)    Types: Cigarettes    Start date: 16     Quit date: 07/13/2020    Years since quitting: 3.2    Passive exposure: Never   Smokeless tobacco: Never   Tobacco comments:    Recent Quit    Vaping Use   Vaping status: Former  Substance Use Topics   Alcohol use: No    Alcohol/week: 0.0 standard drinks of alcohol    Comment: quit drinking 2014   Drug use: No     MEDICATIONS    Home Medication:    Current Medication:  Current Facility-Administered Medications:    acetaminophen (TYLENOL) tablet 650 mg, 650 mg, Oral, Q6H PRN **OR** acetaminophen (TYLENOL) suppository 650 mg, 650 mg, Rectal, Q6H PRN, Mikey College T, MD   apixaban (ELIQUIS) tablet 5 mg, 5 mg, Oral, BID, Mikey College T, MD, 5 mg at 10/26/23 0934   budesonide (PULMICORT) nebulizer solution 0.5 mg, 0.5 mg, Nebulization, BID, Jamelle Rushing L, MD, 0.5 mg at 10/26/23 0828   clonazePAM (KLONOPIN) tablet 0.5 mg, 0.5  mg, Oral, QHS, Mikey College T, MD, 0.5 mg at 10/25/23 2142   diltiazem (CARDIZEM CD) 24 hr capsule 120 mg, 120 mg, Oral, BID, Mikey College T, MD, 120 mg at 10/26/23 1610   dofetilide (TIKOSYN) capsule 250 mcg, 250 mcg, Oral, BID, Mikey College T, MD, 250 mcg at 10/26/23 9604   feeding supplement (ENSURE ENLIVE / ENSURE PLUS) liquid 237 mL, 237 mL, Oral, BID BM, Leeroy Bock, MD, 237 mL at 10/26/23 0935   finasteride (PROSCAR) tablet 5 mg, 5 mg, Oral, Daily, Mikey College T, MD, 5 mg at 10/26/23 0934   fluticasone (FLONASE) 50 MCG/ACT nasal spray 2 spray, 2 spray, Each Nare, Daily, Mansy, Jan A, MD, 2 spray at 10/26/23 0935   furosemide (LASIX) tablet 40 mg, 40 mg, Oral, Daily, Leeroy Bock, MD, 40 mg at 10/26/23 0934   gabapentin (NEURONTIN) capsule 200 mg, 200 mg, Oral, QHS, Mikey College T, MD, 200 mg at 10/25/23 2142   guaiFENesin (MUCINEX) 12 hr tablet 1,200 mg, 1,200 mg, Oral, BID, Sunnie Nielsen, DO, 1,200 mg at 10/26/23 0934   ipratropium-albuterol (DUONEB) 0.5-2.5 (3) MG/3ML nebulizer solution 3 mL, 3 mL, Nebulization, Q6H, Rohnan Bartleson, MD, 3  mL at 10/26/23 0828   liver oil-zinc oxide (DESITIN) 40 % ointment, , Topical, BID, Mikey College T, MD, Given at 10/26/23 [phone]   morphine (PF) 2 MG/ML injection 2 mg, 2 mg, Intravenous, Once, Erin Fulling, MD   oxyCODONE (Oxy IR/ROXICODONE) immediate release tablet 5-10 mg, 5-10 mg, Oral, Q6H PRN, Sunnie Nielsen, DO, 10 mg at 10/22/23 1235   pantoprazole (PROTONIX) EC tablet 40 mg, 40 mg, Oral, Daily, Mikey College T, MD, 40 mg at 10/26/23 0934   polyethylene glycol (MIRALAX / GLYCOLAX) packet 17 g, 17 g, Oral, Daily PRN, Mikey College T, MD, 17 g at 10/26/23 8119   rosuvastatin (CRESTOR) tablet 20 mg, 20 mg, Oral, QHS, Zhang, Ping T, MD, 20 mg at 10/25/23 2142   senna (SENOKOT) tablet 17.2 mg, 2 tablet, Oral, QHS, Zhang, Ilda Foil T, MD, 17.2 mg at 10/25/23 2142   sodium chloride flush (NS) 0.9 % injection 3 mL, 3 mL, Intravenous, Q12H, Mikey College T, MD, 3 mL at 10/26/23 0934   sodium chloride flush (NS) 0.9 % injection 3 mL, 3 mL, Intravenous, PRN, Mikey College T, MD   umeclidinium-vilanterol (ANORO ELLIPTA) 62.5-25 MCG/ACT 1 puff, 1 puff, Inhalation, Daily, Sunnie Nielsen, DO, 1 puff at 10/26/23 0857    ALLERGIES   Flagyl [metronidazole]  IMAGING    DG Chest Port 1 View Result Date: 10/21/2023 CLINICAL DATA:  14782 COPD (chronic obstructive pulmonary disease) (HCC) 95621 EXAM: PORTABLE CHEST 1 VIEW COMPARISON:  08/23/2023, 05/31/2023 FINDINGS: The heart size and mediastinal contours are within normal limits. Hyperinflated lungs with chronically coarsened interstitial markings bilaterally. No new focal airspace consolidation. No pleural effusion or pneumothorax. The visualized skeletal structures are unremarkable. IMPRESSION: Chronic lung changes without acute cardiopulmonary findings. Electronically Signed   By: Duanne Guess D.O.   On: 10/21/2023 14:25   BP (!) 121/57 (BP Location: Left Arm)   Pulse 78   Temp 98.3 F (36.8 C) (Oral)   Resp 20   Ht 5' 10.5" (1.791 m)   Wt 68 kg    SpO2 100%   BMI 21.22 kg/m        Review of Systems: Gen:  Denies  fever, sweats, chills weight loss  HEENT: Denies blurred vision, double vision, ear pain, eye pain, hearing loss, nose bleeds, sore throat Cardiac:  No dizziness, chest pain or heaviness, chest tightness,edema, No JVD Resp:   No cough, -sputum production, +shortness of breath,+wheezing, -hemoptysis,  Other:  All other systems negative   Physical Examination:   General Appearance: +distress  EYES PERRLA, EOM intact.   NECK Supple, No JVD Pulmonary: normal breath sounds, +wheezing.  CardiovascularNormal S1,S2.  No m/r/g.   Abdomen: Benign, Soft, non-tender. Neurology UE/LE 5/5 strength, no focal deficits Ext pulses intact, cap refill intact ALL OTHER ROS ARE NEGATIVE    ASSESSMENT/PLAN   83 yo white male with acute COPD exacerbation with end stage COPD with chronic hypoxic and hypercapnic resp failure with recurrent bouts of COPD exacerbation   SEVERE COPD EXACERBATION biPAP as needed -continue  steroids as prescribed -continue NEB THERAPY as prescribed -morphine as needed -wean fio2 as needed and tolerated Continue lasix as tolerated  Assess for non-invasive ventilation-TOC consultation    Patient  satisfied with Plan of action and management. All questions answered   I spent a total of 35 minutes reviewing chart data, face-to-face evaluation with the patient, counseling and coordination of care as detailed above.     Lucie Leather, M.D.  [facility]  Medical Director [facility] Medical Director [facility] Cardio-Pulmonary Department

## 2023-10-26 NOTE — Progress Notes (Signed)
 PROGRESS NOTE  [name] [name].    DOB: [birth year], 83 y.o.  WUJ:[id number]    Code Status: Limited: Do not attempt resuscitation (DNR) -DNR-LIMITED -Do Not Intubate/DNI    DOA: 10/21/2023   LOS: 5   Brief hospital course  [name] [name]. is a 83 y.o. 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   03/24: admitted to hospitalist service for COPD exacerbation Requiring BiPAP in ED.  presently: still very SOB with minimal exertion. Very deconditioned. continue tx. Pulmonology consulted and recommending morphine PRN and NIV. Currently awaiting discharge home once respiratory status stabilizes and home NIV can be delivered.   Assessment & Plan  Principal Problem:   COPD (chronic obstructive pulmonary disease) (HCC) Active Problems:   Acute on chronic respiratory failure with hypoxia (HCC)   COPD with acute exacerbation (HCC)  Acute on chronic hypoxic and hypercapnic respiratory failure Acute COPD exacerbation Patient received ABG while on Bipap  to evaluate for qualifications for home NIV. pCO2 70. TOC consulted for arrangements. Dc plans for today delayed due to increased WOB. Continue BiPAP support prn Wean O2 as tolerated Doxycycline Steroids - IV solumedrol --> po prednisone  Continue home Breztri  Duoneb q6h  Xopenex neb q6h prn  Add pulmicort neb scheduled bid and increased dose Mucinex  Incentive spirometry  Ambulate as tolerated F/u pulmonology consult Continue morphine PRN   Acute on chronic HFpEF without decompensation S/p IV diuresis  Strict I&O Continue home furosmide po. Increased dose. Appears euvolemic on exam Echocardiogram was done recently, will not repeat echo at this time   PAF with question of RVR- rate controlled EKG showed  borderline RVR, telemonitoring showed few episode of breakthrough heart rate, now resolved  Continue home Cardizem and Tikosyn Continue Eliquis   Deconditioning PT/OT evaluation   GERD PPI    No concerns based on BMI: Body mass index is 21.22 kg/m.  Body mass index is 21.22 kg/m.  VTE ppx:  apixaban (ELIQUIS) tablet 5 mg   Diet:     Diet   Diet regular Room service appropriate? Yes; Fluid consistency: Thin   Consultants: Pulmonology    Subjective 10/26/23    Pt reports feeling worse today. SOB. Really disappointed to not be going home but wants to stay where he can receive the best care. Has been on bipap this morning but appears improved and ready to trial off.    Objective   Vitals:   10/25/23 2010 10/25/23 2040 10/26/23 0014 10/26/23 0329  BP: (!) 107/55  (!) 110/39 128/68  Pulse: 85  84 78  Resp: 20  20 20   Temp: 97.7 F (36.5 C)  97.7 F (36.5 C) 97.6 F (36.4 C)  TempSrc: Oral   Oral  SpO2: 100% 97% 97% 98%  Weight:      Height:        Intake/Output Summary (Last 24 hours) at 10/26/2023 0758 Last data filed at 10/26/2023 0500 Gross per 24 hour  Intake 380 ml  Output 1300 ml  Net -920 ml   Filed Weights   10/21/23 1210  Weight: 68 kg     Physical Exam:  General: awake, alert, NAD HEENT: atraumatic, clear conjunctiva, anicteric sclera, MMM, hearing grossly normal Respiratory: normal effort. mild inhalation wheezing. No rales or rhonchi  Cardiovascular: quick capillary refill, normal S1/S2, RRR, no JVD, murmurs Nervous: A&O x3. no gross focal neurologic deficits, normal speech Extremities: moves all equally, no edema, normal tone Skin: dry, intact, normal temperature, normal color. No rashes, lesions or ulcers on exposed skin Psychiatry: normal mood, congruent affect  Labs   I have personally reviewed the following labs and imaging studies CBC    Component Value Date/Time   WBC 9.7 10/26/2023 0435   RBC 3.62 (L) 10/26/2023 0435   HGB 10.5  (L) 10/26/2023 0435   HGB 15.3 05/02/2023 1445   HCT 32.9 (L) 10/26/2023 0435   HCT 46.9 05/02/2023 1445   PLT 337 10/26/2023 0435   PLT 226 05/02/2023 1445   MCV 90.9 10/26/2023 0435   MCV 94 05/02/2023 1445   MCH 29.0 10/26/2023 0435   MCHC 31.9 10/26/2023 0435   RDW 14.8 10/26/2023 0435   RDW 13.1 05/02/2023 1445   LYMPHSABS 0.7 09/01/2023 0535   LYMPHSABS 1.4 04/09/2018 1650   MONOABS 0.8 09/01/2023 0535   EOSABS 0.0 09/01/2023 0535   EOSABS 0.2 04/09/2018 1650   BASOSABS 0.0 09/01/2023 0535   BASOSABS 0.1 04/09/2018 1650      Latest Ref Rng & Units 10/26/2023    4:35 AM 10/24/2023    5:15 AM 10/23/2023    5:54 AM  BMP  Glucose 70 - 99 mg/dL 161  096  045   BUN 8 - 23 mg/dL 29  36  31   Creatinine 0.61 - 1.24 mg/dL 4.09  8.11  9.14   Sodium 135 - 145 mmol/L 139  139  135   Potassium 3.5 - 5.1 mmol/L 4.6  4.4  3.6   Chloride 98 - 111 mmol/L 98  99  98   CO2 22 - 32 mmol/L 37  32  31   Calcium 8.9 - 10.3 mg/dL 8.6  8.7  8.5     No results found.   Disposition Plan & Communication  Patient status: Inpatient  Admitted From: Home Planned disposition location: Home health Anticipated discharge date: 3/30 pending home NIV equipment arrangements and respiratory status improvement  Family Communication: wife at bedside    Author: Leeroy Bock, DO [facility] 10/26/2023, 7:58 AM   Available by Epic secure chat 7AM-7PM. If 7PM-7AM, please contact night-coverage.  [facility] contact information found on [email or web address].

## 2023-10-27 DIAGNOSIS — R0689 Other abnormalities of breathing: Secondary | ICD-10-CM | POA: Diagnosis not present

## 2023-10-27 DIAGNOSIS — J441 Chronic obstructive pulmonary disease with (acute) exacerbation: Secondary | ICD-10-CM | POA: Diagnosis not present

## 2023-10-27 LAB — BASIC METABOLIC PANEL WITH GFR
Anion gap: 7 (ref 5–15)
BUN: 31 mg/dL — ABNORMAL HIGH (ref 8–23)
CO2: 33 mmol/L — ABNORMAL HIGH (ref 22–32)
Calcium: 8.4 mg/dL — ABNORMAL LOW (ref 8.9–10.3)
Chloride: 98 mmol/L (ref 98–111)
Creatinine, Ser: 0.83 mg/dL (ref 0.61–1.24)
GFR, Estimated: >60 mL/min (ref >60)
Glucose, Bld: 94 mg/dL (ref 70–99)
Potassium: 4.4 mmol/L (ref 3.5–5.1)
Sodium: 138 mmol/L (ref 135–145)

## 2023-10-27 LAB — CBC
HCT: 32.2 % — ABNORMAL LOW (ref 39.0–52.0)
Hemoglobin: 10.3 g/dL — ABNORMAL LOW (ref 13.0–17.0)
MCH: 29.1 pg (ref 26.0–34.0)
MCHC: 32 g/dL (ref 30.0–36.0)
MCV: 91 fL (ref 80.0–100.0)
Platelets: 300 10*3/uL (ref 150–400)
RBC: 3.54 MIL/uL — ABNORMAL LOW (ref 4.22–5.81)
RDW: 14.6 % (ref 11.5–15.5)
WBC: 9.3 10*3/uL (ref 4.0–10.5)
nRBC: 0 % (ref 0.0–0.2)

## 2023-10-27 LAB — PHOSPHORUS: Phosphorus: 3.4 mg/dL (ref 2.5–4.6)

## 2023-10-27 LAB — MAGNESIUM: Magnesium: 2.1 mg/dL (ref 1.7–2.4)

## 2023-10-27 MED ORDER — ALUM & MAG HYDROXIDE-SIMETH 200-200-20 MG/5ML PO SUSP
30.0000 mL | ORAL | Status: DC | PRN
  Filled 2023-10-27: qty 30

## 2023-10-27 MED ORDER — MORPHINE SULFATE (PF) 2 MG/ML IV SOLN
2.0000 mg | Freq: Every day | INTRAVENOUS | Status: DC | PRN
  Administered 2023-10-27: 2 mg via INTRAVENOUS
  Filled 2023-10-27: qty 1

## 2023-10-27 MED ORDER — PREDNISONE 20 MG PO TABS
40.0000 mg | ORAL_TABLET | Freq: Every day | ORAL | Status: DC
  Administered 2023-10-28 – 2023-10-29 (×2): 40 mg via ORAL
  Filled 2023-10-27 (×2): qty 2

## 2023-10-27 NOTE — TOC Progression Note (Signed)
 Transition of Care (TOC) - Progression Note    Patient Details  Name: [name] [name]. MRN: [id number] Date of Birth: 1940/11/12  Transition of Care [facility]) CM/SW Contact  Bing Quarry, RN Phone Number: 10/27/2023, 3:31 PM  Clinical Narrative:  Update at 315 pm: [employer] returned call. BiPap used in insurance authorization period should be ready around noon Monday 10/28/23. Patient was still very weak and short of breath today.    Gabriel Cirri MSN RN CM  RN Case Manager [place]  Transitions of Care Direct Dial: [phone] (Weekends Only) [facility] Main Office Phone: [phone] [facility] Fax: [phone] [email or web address]           Expected Discharge Plan and Services         Expected Discharge Date: 10/27/23                                     Social Determinants of Health (SDOH) Interventions SDOH Screenings   Food Insecurity: No Food Insecurity (10/21/2023)  Housing: Low Risk  (10/21/2023)  Transportation Needs: No Transportation Needs (10/21/2023)  Utilities: Not At Risk (10/21/2023)  Depression (PHQ2-9): Low Risk  (07/15/2023)  Financial Resource Strain: Low Risk  (01/06/2023)  Physical Activity: Sufficiently Active (01/06/2023)  Social Connections: Socially Isolated (10/21/2023)  Stress: No Stress Concern Present (01/06/2023)  Tobacco Use: Medium Risk (10/21/2023)    Readmission Risk Interventions    10/22/2023    3:48 PM  Readmission Risk Prevention Plan  Transportation Screening Complete  PCP or Specialist Appt within 3-5 Days Complete  HRI or Home Care Consult Complete  Social Work Consult for Recovery Care Planning/Counseling Complete  Palliative Care Screening Complete  Medication Review Oceanographer) Complete

## 2023-10-27 NOTE — Progress Notes (Signed)
 [facility] [place] [facility] Medicine Consultation      Date: 10/27/2023,   MRN# [id number] [name] [name]. 1941-01-20     CHIEF COMPLAINT:   Acute SOB   HISTORY OF PRESENT ILLNESS  83 year old male with a past medical history of COPD end stage   Patient was previously followed by Dr. Delton Coombes, last saw him on 10/19/2022. He has a history of COPD, with chronic hypoxic respiratory failure maintained on 2-3 L via nasal cannula. He has also been followed by Dr. Delton Coombes for pulmonary nodular disease requiring serial CT scans and bronchoscopies with nodular biopsies, without an established diagnosis. Some of the biopsies had shown granulomatous inflammation. Multiple CT scans had shown waxing and waning pulmonary nodular disease. Previous lung biopsies are from 2017 (3 targets, benign lung parenchyma with mild fibrosis and anthracosis), 09/2019 (benign cytology, noncaseating granulomatous inflammation from RLL biopsy), and 03/2020 (negative cytology and pathology).   OUTPATIENT THERAPY He is maintained on Breztri for his COPD, and is also using duo-nebs and albuterol via nebulizer. Patient is chronically maintained on prednisone therapy, currently at 10 mg orally once daily. He reports being on prednisone for years.  admitted to the hospital on 05/31/2023 secondary to increased shortness of breath with increased sputum production. He was admitted for acute on chronic hypoxic respiratory failure requiring IV steroids, IV antibiotics, and nebulizers. He felt better after his discharge and his symptoms are back to baseline. Previously also admitted in January of 2024 secondary to acute on chronic hypoxic and hypercapnic respiratory failure due to RSV infection.      The patient's other medical conditions include HFpEF, Afib, and CAD s/p PCI. He is maintained on apixaban, dofetilide, digoxin, furosemide, gabapentin, and rosuvastatin. He is also chronically on prednisone 10 mg daily and is also on  gabapentin.   He has a long standing history of smoking, quit 3 years ago. He has at least 50 pack years of smoking history. He used to work in [employer] and denies occupational exposures.     Patient currently admitted for severe  COPD exacerbation Systemic steroids, NEB THERAPY AND OXYGEN THERAPY Increased WOB On NEBS, STEROIDS, OXYGEN THERAPY Plan for assessment for noninvasive ventilation  3/27 SOB, wheezing coughing Morphine helped tremendously  3/28 less WOB but still with cough and wheezing Wants more morphine  3/29 increased WOB, placed back on BiPAP Will give morphine  3/30 responds well to morphine Still WOB and Wheezing, awaiting TOC for update on Noninvasive ventilation   EVENTS OVERNIGHT Wheezing Increased SOB Feels a little better than yesterday   PAST MEDICAL HISTORY   Past Medical History:  Diagnosis Date   Abdominal discomfort 12/07/2020   Acquired trigger finger of right middle finger 02/04/2020   Acute prostatitis 04/08/2018   Alcohol abuse    Alcohol dependence (HCC) 12/07/2020   Anal or rectal pain 04/08/2018   Annual physical exam 12/07/2020   Anorectal disorder 12/07/2020   Arthritis    Atherosclerotic heart disease of native coronary artery without angina pectoris 04/08/2018   a. 03/2018 PCI: LM nl, LAD 1m, LCX nl, OM1 70p (2.75x8 Synergy DES), 29m (2.5x24 Synergy DES), RCA 25p, RPL1 50. EF 55-65%.   Atrial flutter (HCC)    a. 07/2022 in setting of hospitalization for resp illness/RSV. CHA2DS2VASc = 4-->eliquis; b. 08/2022 s/p DCCV (100J); c. 09/26/2022 Recurrent Aflutter.   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 02/2022 Carotid U/S: 1-39% bilat ICA stenoses.   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   Contracture of palmar fascia 12/07/2020   COPD (chronic obstructive pulmonary disease) (HCC)    Corn of toe 12/07/2020   Corns and callosities 04/09/2018   Coronary artery disease    2019 with stents   Coronary atherosclerosis 05/07/2018   Degenerative disc disease, 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   Degenerative disc disease, 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   Diarrhea 04/09/2018   Double vision with both eyes open 12/07/2020   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   Dysphagia 08/14/2018   Dyspnea    ED (erectile dysfunction)    ED (erectile dysfunction) of organic origin 12/07/2020   Elevated prostate specific antigen (PSA) 04/08/2018   Elevated PSA    Encounter for screening for other disorder 12/07/2020   Enlarged prostate without lower urinary tract symptoms (luts) 04/09/2018   Enthesopathy 12/07/2020   Essential (primary) hypertension 04/08/2018   Ex-smoker 04/09/2018   Exertional dyspnea 04/02/2018   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 07/2022 Echo: EF 65-70%, no rwma, mild LVH, nl RV fxn.   Hyperlipidemia    Hypertension    Hypoxemia 12/07/2020   Kidney cysts    Left knee pain 12/07/2020   Low back pain 04/08/2018   Male erectile dysfunction 04/09/2018   Mixed hyperlipidemia 04/08/2018   Nocturia more than twice per night 04/09/2018   Osteoarthritis of first carpometacarpal joint 04/08/2018   Osteoarthritis of knee 12/07/2020   Other long term (current) drug therapy 12/07/2020   Pain due to onychomycosis of toenail of left foot 07/20/2019   Pain in  joint 04/09/2018   Pain in knee 04/09/2018   Pain in unspecified knee 12/07/2020   Pain of finger 04/09/2018   Palpitations    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HCC) 12/07/2020   Personal history of colonic polyps 12/07/2020   Pneumonia    Pneumothorax 04/12/2020   Polypharmacy 04/09/2018   Porokeratosis 01/13/2019   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HCC)    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   Pulmonary nodules 06/08/2016   Pure hypercholesterolemia 12/07/2020   Renal cyst 12/07/2020   Sleep disorder 04/08/2018   Status post bronchoscopy 04/12/2020   Tear of rotator cuff 04/09/2018   Tobacco user 12/07/2020   Uncomplicated alcohol dependence (HCC) 04/09/2018   Unspecified rotator cuff tear or rupture of unspecified shoulder, not specified as traumatic 12/07/2020   Unspecified tear of unspecified meniscus, current injury, unspecified knee, initial encounter 12/07/2020   Visual disturbance 12/07/2020   Vitamin D deficiency 04/08/2018     SURGICAL HISTORY   Past Surgical History:  Procedure Laterality Date   BRONCHIAL BIOPSY  10/13/2019   Procedure: BRONCHIAL BIOPSIES;  Surgeon: Leslye Peer, MD;  Location: [facility] ENDOSCOPY;  Service: Pulmonary;;   BRONCHIAL BIOPSY  04/12/2020   Procedure: BRONCHIAL BIOPSIES;  Surgeon: Leslye Peer, MD;  Location: [facility] ENDOSCOPY;  Service: Pulmonary;;   BRONCHIAL BRUSHINGS  10/13/2019   Procedure: BRONCHIAL BRUSHINGS;  Surgeon: Delton Coombes,  Les Pou, MD;  Location: [facility] ENDOSCOPY;  Service: Pulmonary;;   BRONCHIAL BRUSHINGS  04/12/2020   Procedure: BRONCHIAL BRUSHINGS;  Surgeon: Leslye Peer, MD;  Location: [facility] ENDOSCOPY;  Service: Pulmonary;;   BRONCHIAL NEEDLE ASPIRATION BIOPSY  10/13/2019   Procedure: BRONCHIAL NEEDLE ASPIRATION BIOPSIES;  Surgeon: Leslye Peer, MD;  Location: [facility] ENDOSCOPY;  Service: Pulmonary;;   BRONCHIAL NEEDLE ASPIRATION BIOPSY  04/12/2020   Procedure: BRONCHIAL NEEDLE ASPIRATION BIOPSIES;  Surgeon: Leslye Peer, MD;  Location: [facility] ENDOSCOPY;   Service: Pulmonary;;   BRONCHIAL WASHINGS  10/13/2019   Procedure: BRONCHIAL WASHINGS;  Surgeon: Leslye Peer, MD;  Location: [facility] ENDOSCOPY;  Service: Pulmonary;;   BRONCHIAL WASHINGS  04/12/2020   Procedure: BRONCHIAL WASHINGS;  Surgeon: Leslye Peer, MD;  Location: [facility] ENDOSCOPY;  Service: Pulmonary;;   CARDIAC CATHETERIZATION  2002   50% RCA   CARDIOVERSION N/A 09/17/2022   Procedure: CARDIOVERSION;  Surgeon: Iran Ouch, MD;  Location: [facility] ORS;  Service: Cardiovascular;  Laterality: N/A;   CATARACT EXTRACTION, BILATERAL     CORONARY STENT INTERVENTION N/A 04/15/2018   Procedure: CORONARY STENT INTERVENTION;  Surgeon: Corky Crafts, MD;  Location: [facility] INVASIVE CV LAB;  Service: Cardiovascular;  Laterality: N/A;  om1   EYE SURGERY     KNEE ARTHROSCOPY     LEFT HEART CATH AND CORONARY ANGIOGRAPHY N/A 04/15/2018   Procedure: LEFT HEART CATH AND CORONARY ANGIOGRAPHY;  Surgeon: Corky Crafts, MD;  Location[facility] INVASIVE CV LAB;  Service: Cardiovascular;  Laterality: N/A;   MULTIPLE TOOTH EXTRACTIONS     TONSILLECTOMY     VIDEO BRONCHOSCOPY  04/12/2020   VIDEO BRONCHOSCOPY WITH ENDOBRONCHIAL NAVIGATION N/A 07/26/2016   Procedure: VIDEO BRONCHOSCOPY WITH ENDOBRONCHIAL NAVIGATION;  Surgeon: Leslye Peer, MD;  Location: [facility] OR;  Service: Thoracic;  Laterality: N/A;   VIDEO BRONCHOSCOPY WITH ENDOBRONCHIAL NAVIGATION N/A 10/13/2019   Procedure: [facility] AND VIDEO BRONCHOSCOPY WITH ENDOBRONCHIAL NAVIGATION;  Surgeon: Leslye Peer, MD;  Location: [facility] ENDOSCOPY;  Service: Pulmonary;  Laterality: N/A;   VIDEO BRONCHOSCOPY WITH ENDOBRONCHIAL NAVIGATION N/A 04/12/2020   Procedure: VIDEO BRONCHOSCOPY WITH ENDOBRONCHIAL NAVIGATION;  Surgeon: Leslye Peer, MD;  Location: [facility] ENDOSCOPY;  Service: Pulmonary;  Laterality: N/A;     FAMILY HISTORY   Family History  Problem Relation Age of Onset   Hypertension Mother    Alzheimer's disease Mother      SOCIAL HISTORY   Social  History   Tobacco Use   Smoking status: Former    Current packs/day: 0.00    Average packs/day: 0.8 packs/day for 68.0 years (51.0 ttl pk-yrs)    Types: Cigarettes    Start date: 24    Quit date: 07/13/2020    Years since quitting: 3.2    Passive exposure: Never   Smokeless tobacco: Never   Tobacco comments:    Recent Quit    Vaping Use   Vaping status: Former  Substance Use Topics   Alcohol use: No    Alcohol/week: 0.0 standard drinks of alcohol    Comment: quit drinking 2014   Drug use: No     MEDICATIONS    Home Medication:    Current Medication:  Current Facility-Administered Medications:    acetaminophen (TYLENOL) tablet 650 mg, 650 mg, Oral, Q6H PRN **OR** acetaminophen (TYLENOL) suppository 650 mg, 650 mg, Rectal, Q6H PRN, Emeline General, MD   apixaban Everlene Balls) tablet 5 mg, 5 mg, Oral, BID, Mikey College T, MD, 5 mg at 10/27/23 [phone]  budesonide (PULMICORT) nebulizer solution 0.5 mg, 0.5 mg, Nebulization, BID, Jamelle Rushing L, MD, 0.5 mg at 10/27/23 0753   clonazePAM (KLONOPIN) tablet 0.5 mg, 0.5 mg, Oral, QHS, Mikey College T, MD, 0.5 mg at 10/26/23 2101   diltiazem (CARDIZEM CD) 24 hr capsule 120 mg, 120 mg, Oral, BID, Mikey College T, MD, 120 mg at 10/27/23 0847   dofetilide (TIKOSYN) capsule 250 mcg, 250 mcg, Oral, BID, Mikey College T, MD, 250 mcg at 10/27/23 0847   feeding supplement (ENSURE ENLIVE / ENSURE PLUS) liquid 237 mL, 237 mL, Oral, BID BM, Leeroy Bock, MD, 237 mL at 10/27/23 0841   finasteride (PROSCAR) tablet 5 mg, 5 mg, Oral, Daily, Mikey College T, MD, 5 mg at 10/27/23 0848   fluticasone (FLONASE) 50 MCG/ACT nasal spray 2 spray, 2 spray, Each Nare, Daily, Mansy, Jan A, MD, 2 spray at 10/27/23 0850   furosemide (LASIX) tablet 40 mg, 40 mg, Oral, Daily, Leeroy Bock, MD, 40 mg at 10/27/23 0848   gabapentin (NEURONTIN) capsule 200 mg, 200 mg, Oral, QHS, Mikey College T, MD, 200 mg at 10/26/23 2100   guaiFENesin (MUCINEX) 12 hr tablet 1,200  mg, 1,200 mg, Oral, BID, Sunnie Nielsen, DO, 1,200 mg at 10/27/23 0848   ipratropium-albuterol (DUONEB) 0.5-2.5 (3) MG/3ML nebulizer solution 3 mL, 3 mL, Nebulization, TID, Erin Fulling, MD, 3 mL at 10/27/23 0753   liver oil-zinc oxide (DESITIN) 40 % ointment, , Topical, BID, Mikey College T, MD, Given at 10/27/23 [phone]   morphine (PF) 2 MG/ML injection 2 mg, 2 mg, Intravenous, Daily PRN, Leeroy Bock, MD, 2 mg at 10/27/23 1112   oxyCODONE (Oxy IR/ROXICODONE) immediate release tablet 5-10 mg, 5-10 mg, Oral, Q6H PRN, Sunnie Nielsen, DO, 10 mg at 10/22/23 1235   pantoprazole (PROTONIX) EC tablet 40 mg, 40 mg, Oral, Daily, Chipper Herb, Ping T, MD, 40 mg at 10/27/23 0847   polyethylene glycol (MIRALAX / GLYCOLAX) packet 17 g, 17 g, Oral, Daily PRN, Mikey College T, MD, 17 g at 10/27/23 1115   rosuvastatin (CRESTOR) tablet 20 mg, 20 mg, Oral, QHS, Zhang, Ping T, MD, 20 mg at 10/26/23 2101   senna (SENOKOT) tablet 17.2 mg, 2 tablet, Oral, QHS, Mikey College T, MD, 17.2 mg at 10/26/23 2100   sodium chloride flush (NS) 0.9 % injection 3 mL, 3 mL, Intravenous, Q12H, Mikey College T, MD, 3 mL at 10/27/23 0850   sodium chloride flush (NS) 0.9 % injection 3 mL, 3 mL, Intravenous, PRN, Mikey College T, MD   umeclidinium-vilanterol (ANORO ELLIPTA) 62.5-25 MCG/ACT 1 puff, 1 puff, Inhalation, Daily, Sunnie Nielsen, DO, 1 puff at 10/27/23 0840    ALLERGIES   Flagyl [metronidazole]  IMAGING    DG Chest Port 1 View Result Date: 10/21/2023 CLINICAL DATA:  96045 COPD (chronic obstructive pulmonary disease) (HCC) 40981 EXAM: PORTABLE CHEST 1 VIEW COMPARISON:  08/23/2023, 05/31/2023 FINDINGS: The heart size and mediastinal contours are within normal limits. Hyperinflated lungs with chronically coarsened interstitial markings bilaterally. No new focal airspace consolidation. No pleural effusion or pneumothorax. The visualized skeletal structures are unremarkable. IMPRESSION: Chronic lung changes without acute  cardiopulmonary findings. Electronically Signed   By: Duanne Guess D.O.   On: 10/21/2023 14:25   BP 133/70 (BP Location: Right Arm)   Pulse 84   Temp 97.8 F (36.6 C)   Resp 18   Ht 5' 10.5" (1.791 m)   Wt 68 kg   SpO2 99%   BMI 21.22 kg/m  Review of Systems: Gen:  Denies  fever, sweats, chills weight loss  HEENT: Denies blurred vision, double vision, ear pain, eye pain, hearing loss, nose bleeds, sore throat Cardiac:  No dizziness, chest pain or heaviness, chest tightness,edema, No JVD Resp: +shortness of breath,+wheezing Other:  All other systems negative   Physical Examination:   General Appearance: + distress  EYES PERRLA, EOM intact.   NECK Supple, No JVD Pulmonary: normal breath sounds, No wheezing.  CardiovascularNormal S1,S2.  No m/r/g.   Abdomen: Benign, Soft, non-tender. Neurology UE/LE 5/5 strength, no focal deficits Ext pulses intact, cap refill intact ALL OTHER ROS ARE NEGATIVE     ASSESSMENT/PLAN   83 yo white male with acute/severe  COPD exacerbation with end stage COPD with chronic hypoxic and hypercapnic resp failure with recurrent bouts of COPD exacerbation   SEVERE COPD EXACERBATION Still remains SOB and wheezing Very poor resp insufficiency Use biPAP as needed -continue  steroids as prescribed -continue NEB THERAPY as prescribed(maintenance  therapy)  Patient will not tolerate traditional inhaler therapy Consider outpatient assessment for biological agents  -morphine as needed -wean fio2 as needed and tolerated Continue lasix as tolerated  Assess for non-invasive ventilation-TOC consultation    Patient  satisfied with Plan of action and management. All questions answered   I spent a total of 40 minutes reviewing chart data, face-to-face evaluation with the patient, counseling and coordination of care as detailed above.     Lucie Leather, M.D.  [facility]  Medical Director  [facility] Medical Director [facility] Cardio-Pulmonary Department

## 2023-10-27 NOTE — Progress Notes (Signed)
 PROGRESS NOTE  [name] [name].    DOB: [birth year], 83 y.o.  ZOX:[id number]    Code Status: Limited: Do not attempt resuscitation (DNR) -DNR-LIMITED -Do Not Intubate/DNI    DOA: 10/21/2023   LOS: 6   Brief hospital course  [name] Addo. is a 83 y.o. 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   03/24: admitted to hospitalist service for COPD exacerbation Requiring BiPAP in ED.  presently: still very SOB with minimal exertion. Very deconditioned. continue tx. Pulmonology consulted and recommending morphine PRN and NIV. Currently awaiting discharge home once respiratory status stabilizes and home NIV can be delivered. As soon as 3/31.  Assessment & Plan  Principal Problem:   COPD (chronic obstructive pulmonary disease) (HCC) Active Problems:   Acute on chronic respiratory failure with hypoxia (HCC)   COPD with acute exacerbation (HCC)  Acute on chronic hypoxic and hypercapnic respiratory failure Acute COPD exacerbation Patient received ABG while on Bipap  to evaluate for qualifications for home NIV. pCO2 70. TOC consulted for arrangements. Dc plans for today delayed due to increased WOB. Continue BiPAP support prn Completed Doxycycline Steroids - IV solumedrol --> po prednisone completed Continue home Breztri  Duoneb q6h  Xopenex neb q6h prn  Add pulmicort neb scheduled bid and increased dose Mucinex  Incentive spirometry  Ambulate as tolerated F/u pulmonology consult Continue morphine PRN   Acute on chronic HFpEF without decompensation S/p IV diuresis  Strict I&O Continue home furosmide po. Increased dose. Appears euvolemic on exam Echocardiogram was done recently, will not repeat echo at this time   PAF with question of RVR- rate  controlled Continue home Cardizem and Tikosyn Continue Eliquis   Deconditioning PT/OT evaluation   GERD PPI    No concerns based on BMI: Body mass index is 21.22 kg/m.  Body mass index is 21.22 kg/m.  VTE ppx:  apixaban (ELIQUIS) tablet 5 mg   Diet:     Diet   Diet regular Room service appropriate? Yes; Fluid consistency: Thin   Consultants: Pulmonology    Subjective 10/27/23    Pt initially planning to go home today against medical advice but as he got up to use bedside commode he became substantially more short of breath and changed his mind about leaving.    Objective   Vitals:   10/26/23 2059 10/27/23 0113 10/27/23 0619 10/27/23 0744  BP: 109/62 123/62 120/65 133/70  Pulse: 92 81 80 84  Resp: 19 19  18   Temp: 98.6 F (37 C) (!) 97.4 F (36.3 C) 97.6 F (36.4 C) 97.8 F (36.6 C)  TempSrc: Oral Oral Oral   SpO2: 99% 96% 99% 99%  Weight:      Height:        Intake/Output Summary (Last 24 hours) at 10/27/2023 0752 Last data filed at 10/27/2023 0600 Gross per 24 hour  Intake 720 ml  Output 500 ml  Net 220 ml   Filed Weights   10/21/23 1210  Weight: 68 kg    Physical Exam:  General: awake, alert, NAD HEENT: atraumatic, clear conjunctiva, anicteric sclera, MMM, hearing grossly normal Respiratory: increased effort. mild inhalation wheezing. No rales or rhonchi. Pursed lip breathing Cardiovascular: quick capillary refill, normal S1/S2, RRR, no JVD, murmurs Nervous: A&O x3. no gross focal neurologic  deficits, normal speech Extremities: moves all equally, no edema, normal tone Skin: dry, intact, normal temperature, normal color. No rashes, lesions or ulcers on exposed skin Psychiatry: normal mood, congruent affect  Labs   I have personally reviewed the following labs and imaging studies CBC    Component Value Date/Time   WBC 9.3 10/27/2023 0547   RBC 3.54 (L) 10/27/2023 0547   HGB 10.3 (L) 10/27/2023 0547   HGB 15.3 05/02/2023 1445   HCT 32.2 (L)  10/27/2023 0547   HCT 46.9 05/02/2023 1445   PLT 300 10/27/2023 0547   PLT 226 05/02/2023 1445   MCV 91.0 10/27/2023 0547   MCV 94 05/02/2023 1445   MCH 29.1 10/27/2023 0547   MCHC 32.0 10/27/2023 0547   RDW 14.6 10/27/2023 0547   RDW 13.1 05/02/2023 1445   LYMPHSABS 0.7 09/01/2023 0535   LYMPHSABS 1.4 04/09/2018 1650   MONOABS 0.8 09/01/2023 0535   EOSABS 0.0 09/01/2023 0535   EOSABS 0.2 04/09/2018 1650   BASOSABS 0.0 09/01/2023 0535   BASOSABS 0.1 04/09/2018 1650      Latest Ref Rng & Units 10/27/2023    5:47 AM 10/26/2023    4:35 AM 10/24/2023    5:15 AM  BMP  Glucose 70 - 99 mg/dL 94  782  956   BUN 8 - 23 mg/dL 31  29  36   Creatinine 0.61 - 1.24 mg/dL 2.13  0.86  5.78   Sodium 135 - 145 mmol/L 138  139  139   Potassium 3.5 - 5.1 mmol/L 4.4  4.6  4.4   Chloride 98 - 111 mmol/L 98  98  99   CO2 22 - 32 mmol/L 33  37  32   Calcium 8.9 - 10.3 mg/dL 8.4  8.6  8.7     No results found.   Disposition Plan & Communication  Patient status: Inpatient  Admitted From: Home Planned disposition location: Home health Anticipated discharge date: 3/31 pending home NIV equipment arrangements and respiratory status improvement  Family Communication: wife at bedside    Author: Leeroy Bock, DO [facility] 10/27/2023, 7:52 AM   Available by Epic secure chat 7AM-7PM. If 7PM-7AM, please contact night-coverage.  [facility] contact information found on [email or web address].

## 2023-10-28 ENCOUNTER — Other Ambulatory Visit: Payer: Self-pay

## 2023-10-28 DIAGNOSIS — R0689 Other abnormalities of breathing: Secondary | ICD-10-CM | POA: Diagnosis not present

## 2023-10-28 DIAGNOSIS — J441 Chronic obstructive pulmonary disease with (acute) exacerbation: Principal | ICD-10-CM

## 2023-10-28 LAB — CBC
HCT: 33.3 % — ABNORMAL LOW (ref 39.0–52.0)
Hemoglobin: 10.4 g/dL — ABNORMAL LOW (ref 13.0–17.0)
MCH: 28.6 pg (ref 26.0–34.0)
MCHC: 31.2 g/dL (ref 30.0–36.0)
MCV: 91.5 fL (ref 80.0–100.0)
Platelets: 309 10*3/uL (ref 150–400)
RBC: 3.64 MIL/uL — ABNORMAL LOW (ref 4.22–5.81)
RDW: 14.9 % (ref 11.5–15.5)
WBC: 10.4 10*3/uL (ref 4.0–10.5)
nRBC: 0 % (ref 0.0–0.2)

## 2023-10-28 LAB — BASIC METABOLIC PANEL WITH GFR
Anion gap: 8 (ref 5–15)
BUN: 27 mg/dL — ABNORMAL HIGH (ref 8–23)
CO2: 33 mmol/L — ABNORMAL HIGH (ref 22–32)
Calcium: 8.3 mg/dL — ABNORMAL LOW (ref 8.9–10.3)
Chloride: 97 mmol/L — ABNORMAL LOW (ref 98–111)
Creatinine, Ser: 0.68 mg/dL (ref 0.61–1.24)
GFR, Estimated: >60 mL/min (ref >60)
Glucose, Bld: 87 mg/dL (ref 70–99)
Potassium: 4.3 mmol/L (ref 3.5–5.1)
Sodium: 138 mmol/L (ref 135–145)

## 2023-10-28 LAB — MAGNESIUM: Magnesium: 2.1 mg/dL (ref 1.7–2.4)

## 2023-10-28 LAB — PHOSPHORUS: Phosphorus: 3.2 mg/dL (ref 2.5–4.6)

## 2023-10-28 MED ORDER — MORPHINE SULFATE (PF) 2 MG/ML IV SOLN
2.0000 mg | INTRAVENOUS | Status: AC
  Administered 2023-10-28 (×2): 2 mg via INTRAVENOUS
  Filled 2023-10-28 (×3): qty 1

## 2023-10-28 MED ORDER — PREDNISONE 20 MG PO TABS
40.0000 mg | ORAL_TABLET | Freq: Every day | ORAL | 0 refills | Status: AC
  Filled 2023-10-28: qty 12, 6d supply, fill #0

## 2023-10-28 NOTE — TOC Progression Note (Addendum)
 Transition of Care (TOC) - Progression Note    Patient Details  Name: [name] [name]. MRN: [id number] Date of Birth: 1941-02-23  Transition of Care [facility]) CM/SW Contact  Truddie Hidden, RN Phone Number: 10/28/2023, 12:21 PM  Clinical Narrative:    Per Marthann Schiller at [employer] the patient has been approved for a generic BIPAP while awaiting approval from his insurance for an adjustable BIPAP. Patient BIPAP will be delivered to his room today. MD notified.   Per MD discharge cancelled for today.           Expected Discharge Plan and Services         Expected Discharge Date: 10/28/23                                     Social Determinants of Health (SDOH) Interventions SDOH Screenings   Food Insecurity: No Food Insecurity (10/21/2023)  Housing: Low Risk  (10/21/2023)  Transportation Needs: No Transportation Needs (10/21/2023)  Utilities: Not At Risk (10/21/2023)  Depression (PHQ2-9): Low Risk  (07/15/2023)  Financial Resource Strain: Low Risk  (01/06/2023)  Physical Activity: Sufficiently Active (01/06/2023)  Social Connections: Socially Isolated (10/21/2023)  Stress: No Stress Concern Present (01/06/2023)  Tobacco Use: Medium Risk (10/21/2023)    Readmission Risk Interventions    10/22/2023    3:48 PM  Readmission Risk Prevention Plan  Transportation Screening Complete  PCP or Specialist Appt within 3-5 Days Complete  HRI or Home Care Consult Complete  Social Work Consult for Recovery Care Planning/Counseling Complete  Palliative Care Screening Complete  Medication Review Oceanographer) Complete

## 2023-10-28 NOTE — Progress Notes (Signed)
 OT Cancellation Note  Patient Details Name: [name] [name]. MRN: [id number] DOB: 05/05/1941   Cancelled Treatment:    Reason Eval/Treat Not Completed: Patient declined, no reason specified Received Pt while sitting up in bed, pt refused OT eval at this time, stating he "I am waiting on the MD before we do anything more since I am leaving soon." Despite verbal encouragement to proceed, will re attempt on next available date.  Glenard Haring M.S. OTR/L  10/28/23, 9:10 AM

## 2023-10-28 NOTE — Discharge Summary (Signed)
 Physician Discharge Summary  Patient: [name] [name]. WUJ:[id number] DOB: 06-Oct-1940   Code Status: Limited: Do not attempt resuscitation (DNR) -DNR-LIMITED -Do Not Intubate/DNI  Admit date: 10/21/2023 Discharge date: 10/29/2023 Disposition: Home health, PT PCP: [name], MD  Recommendations for Outpatient Follow-up:  Follow up with PCP within 1-2 weeks Regarding general hospital follow up and preventative care Recommend close follow-up with hospice Follow up with pulmonology at earliest available appointment  Discharge Diagnoses:  Principal Problem:   COPD (chronic obstructive pulmonary disease) (HCC) Active Problems:   Acute on chronic respiratory failure with hypoxia (HCC)   COPD with acute exacerbation (HCC)   Hypercarbia   COPD exacerbation [facility])  Brief Hospital Course Summary: [name] [name]. is a 83 y.o. 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   03/24: admitted to hospitalist service for COPD exacerbation Requiring BiPAP in ED.  presently: still very SOB with minimal exertion. Very deconditioned. continue tx. Pulmonology consulted and recommending morphine PRN and NIV. Currently awaiting discharge home once respiratory status stabilizes and home NIV can be delivered.  Although patient is very deconditioned, him and his spouse feel very strongly about going home.  Palliative was consulted and discussed hospice care with the patient and wife.  Initially, they were interested in being enrolled with hospice care with just prior to discharge they declined and said that they would follow-up outpatient. Patient CPAP had been delivered bedside and the wife took at home.  Once insurance authorization has been completed his home health company is  supposed to exchange the CPAP for an NIV.  All other chronic conditions were treated with home medications.    Discharge Condition: Fair, improved Recommended discharge diet: Regular healthy diet  Consultations: Palliative Pulmonary  Procedures/Studies: BiPAP  Allergies as of 10/29/2023       Reactions   Flagyl [metronidazole] Other (See Comments)   Other reaction(s): skin reaction        Medication List     STOP taking these medications    silver sulfADIAZINE 1 % cream Commonly known as: SILVADENE       TAKE these medications    acetaminophen 325 MG tablet Commonly known as: TYLENOL Take 650 mg by mouth every 4 (four) hours as needed.   albuterol 108 (90 Base) MCG/ACT inhaler Commonly known as: VENTOLIN HFA INHALE 2 PUFFS 2-3 TIMES A DAY AS NEEDED FOR WHEEZING (30 DAYS) 30 DAY(S)   apixaban 5 MG Tabs tablet Commonly known as: ELIQUIS Take 1 tablet (5 mg total) by mouth 2 (two) times daily.   Breztri Aerosphere 160-9-4.8 MCG/ACT Aero Generic drug: budeson-glycopyrrolate-formoterol Inhale 2 puffs into the lungs 2 (two) times daily.   clonazePAM 0.5 MG tablet Commonly known as: KLONOPIN TAKE 1 TABLET BY MOUTH EVERYDAY AT BEDTIME   diltiazem 120 MG 24 hr capsule Commonly known as: CARDIZEM CD TAKE 1 CAPSULE BY MOUTH 2 TIMES DAILY.   dofetilide 250 MCG capsule Commonly known as: TIKOSYN Take 1 capsule (250 mcg total) by mouth 2 (two) times daily.   finasteride 5 MG tablet Commonly known as: PROSCAR Take 5 mg by mouth daily.   furosemide 20 MG tablet Commonly known as: LASIX Take 20-40 mg by mouth daily. Give Alternating Dose of 20mg /40mg  by mouth once daily.  gabapentin 100 MG capsule Commonly known as: NEURONTIN Take 200 mg by mouth at bedtime.   ipratropium-albuterol 0.5-2.5 (3) MG/3ML Soln Commonly known as: DUONEB INHALE 3 ML BY NEBULIZER EVERY 6 HOURS AS NEEDED   morphine 20 MG/ML concentrated solution Commonly known as: ROXANOL Take  0.25 mLs (5 mg total) by mouth daily as needed.   nitroGLYCERIN 0.4 MG SL tablet Commonly known as: NITROSTAT Place 1 tablet (0.4 mg total) under the tongue every 5 (five) minutes as needed for chest pain.   OXYGEN Inhale into the lungs. 3-4 liters upon exertion and 3 liters continuous at night.   pantoprazole 40 MG tablet Commonly known as: PROTONIX Take 1 tablet (40 mg total) by mouth daily.   polyethylene glycol 17 g packet Commonly known as: MIRALAX / GLYCOLAX Take 17 g by mouth daily as needed for moderate constipation.   predniSONE 20 MG tablet Commonly known as: DELTASONE Take 2 tablets (40 mg total) by mouth daily with breakfast for 6 days.   rosuvastatin 20 MG tablet Commonly known as: CRESTOR Take 1 tablet (20 mg total) by mouth daily.   senna 8.6 MG Tabs tablet Commonly known as: SENOKOT Take 2 tablets by mouth at bedtime.   sodium chloride HYPERTONIC 3 % nebulizer solution Take by nebulization in the morning and at bedtime.               Durable Medical Equipment  (From admission, onward)           Start     Ordered   10/25/23 0821  For home use only DME continuous positive airway pressure (CPAP)  Once       Question Answer Comment  Length of Need Lifetime   Patient has OSA or probable OSA No   Is the patient currently using CPAP in the home No   Settings Autotitration   CPAP supplies needed Mask, headgear, cushions, filters, heated tubing and water chamber   Additional equipment included Heated humification and supplies      10/25/23 0820            Follow-up Information     [name], MD. Schedule an appointment as soon as possible for a visit in 1 week(s).   Specialties: Internal Medicine, Pediatrics Contact information: [address] [place] [address] [phone]                 Subjective   Pt reports feeling shortness of breath with minimal exertion.  Denies any shortness of breath at rest.   Denies chest pain.  He states that he is interested in comfort care.  All questions and concerns were addressed at time of discharge.  Objective  Blood pressure (!) 103/43, pulse 86, temperature 97.6 F (36.4 C), resp. rate 20, height 5' 10.5" (1.791 m), weight 68 kg, SpO2 100%.   General: Pt is alert, awake, not in acute distress Cardiovascular: RRR, S1/S2 +, no rubs, no gallops Respiratory: Scattered wheezing, breathing through pursed lips, mild accessory muscle use. Abdominal: Soft, NT, ND, bowel sounds + Extremities: no edema, no cyanosis  The results of significant diagnostics from this hospitalization (including imaging, microbiology, ancillary and laboratory) are listed below for reference.   Imaging studies: DG Chest Port 1 View Result Date: 10/21/2023 CLINICAL DATA:  [id number] COPD (chronic obstructive pulmonary disease) (HCC) [id number] EXAM: PORTABLE CHEST 1 VIEW COMPARISON:  08/23/2023, 05/31/2023 FINDINGS: The heart size and mediastinal contours are within normal limits. Hyperinflated lungs with chronically coarsened  interstitial markings bilaterally. No new focal airspace consolidation. No pleural effusion or pneumothorax. The visualized skeletal structures are unremarkable. IMPRESSION: Chronic lung changes without acute cardiopulmonary findings. Electronically Signed   By: Duanne Guess D.O.   On: 10/21/2023 14:25    Labs: Basic Metabolic Panel: Recent Labs  Lab 10/23/23 0554 10/24/23 0515 10/26/23 0435 10/27/23 0547 10/28/23 0326  NA 135 139 139 138 138  K 3.6 4.4 4.6 4.4 4.3  CL 98 99 98 98 97*  CO2 31 32 37* 33* 33*  GLUCOSE 145* 107* 107* 94 87  BUN 31* 36* 29* 31* 27*  CREATININE 0.86 0.94 0.72 0.83 0.68  CALCIUM 8.5* 8.7* 8.6* 8.4* 8.3*  MG 2.3 2.3 2.1 2.1 2.1  PHOS  --   --  3.1 3.4 3.2   CBC: Recent Labs  Lab 10/26/23 0435 10/27/23 0547 10/28/23 0326  WBC 9.7 9.3 10.4  HGB 10.5* 10.3* 10.4*  HCT 32.9* 32.2* 33.3*  MCV 90.9 91.0 91.5  PLT 337 300 309    Microbiology: Results for orders placed or performed during the hospital encounter of 10/21/23  Resp panel by RT-PCR (RSV, Flu A&B, Covid) Anterior Nasal Swab     Status: None   Collection Time: 10/21/23  1:26 PM   Specimen: Anterior Nasal Swab  Result Value Ref Range Status   SARS Coronavirus 2 by RT PCR NEGATIVE NEGATIVE Final    Comment: (NOTE) SARS-CoV-2 target nucleic acids are NOT DETECTED.  The SARS-CoV-2 RNA is generally detectable in upper respiratory specimens during the acute phase of infection. The lowest concentration of SARS-CoV-2 viral copies this assay can detect is 138 copies/mL. A negative result does not preclude SARS-Cov-2 infection and should not be used as the sole basis for treatment or other patient management decisions. A negative result may occur with  improper specimen collection/handling, submission of specimen other than nasopharyngeal swab, presence of viral mutation(s) within the areas targeted by this assay, and inadequate number of viral copies(<138 copies/mL). A negative result must be combined with clinical observations, patient history, and epidemiological information. The expected result is Negative.  Fact Sheet for Patients:  BloggerCourse.com  Fact Sheet for Healthcare Providers:  SeriousBroker.it  This test is no t yet approved or cleared by the [place] FDA and  has been authorized for detection and/or diagnosis of SARS-CoV-2 by FDA under an Emergency Use Authorization (EUA). This EUA will remain  in effect (meaning this test can be used) for the duration of the COVID-19 declaration under Section 564(b)(1) of the Act, 21 U.S.C.section 360bbb-3(b)(1), unless the authorization is terminated  or revoked sooner.       Influenza A by PCR NEGATIVE NEGATIVE Final   Influenza B by PCR NEGATIVE NEGATIVE Final    Comment: (NOTE) The Xpert Xpress SARS-CoV-2/FLU/RSV plus assay is intended  as an aid in the diagnosis of influenza from Nasopharyngeal swab specimens and should not be used as a sole basis for treatment. Nasal washings and aspirates are unacceptable for Xpert Xpress SARS-CoV-2/FLU/RSV testing.  Fact Sheet for Patients: BloggerCourse.com  Fact Sheet for Healthcare Providers: SeriousBroker.it  This test is not yet approved or cleared by the [place] FDA and has been authorized for detection and/or diagnosis of SARS-CoV-2 by FDA under an Emergency Use Authorization (EUA). This EUA will remain in effect (meaning this test can be used) for the duration of the COVID-19 declaration under Section 564(b)(1) of the Act, 21 U.S.C. section 360bbb-3(b)(1), unless the authorization is terminated or revoked.  Resp Syncytial Virus by PCR NEGATIVE NEGATIVE Final    Comment: (NOTE) Fact Sheet for Patients: BloggerCourse.com  Fact Sheet for Healthcare Providers: SeriousBroker.it  This test is not yet approved or cleared by the [place] FDA and has been authorized for detection and/or diagnosis of SARS-CoV-2 by FDA under an Emergency Use Authorization (EUA). This EUA will remain in effect (meaning this test can be used) for the duration of the COVID-19 declaration under Section 564(b)(1) of the Act, 21 U.S.C. section 360bbb-3(b)(1), unless the authorization is terminated or revoked.  Performed at [facility], [address] [place], [place] [address]   Blood Culture (routine x 2)     Status: None   Collection Time: 10/21/23  1:27 PM   Specimen: BLOOD  Result Value Ref Range Status   Specimen Description BLOOD RIGHT ANTECUBITAL  Final   Special Requests   Final    BOTTLES DRAWN AEROBIC AND ANAEROBIC Blood Culture adequate volume   Culture   Final    NO GROWTH 5 DAYS Performed at [facility], [address] [place], [place]  [address]    Report Status 10/26/2023 FINAL  Final  Blood Culture (routine x 2)     Status: None   Collection Time: 10/21/23  1:27 PM   Specimen: BLOOD  Result Value Ref Range Status   Specimen Description BLOOD LEFT ANTECUBITAL  Final   Special Requests   Final    BOTTLES DRAWN AEROBIC AND ANAEROBIC Blood Culture adequate volume   Culture   Final    NO GROWTH 5 DAYS Performed at [facility], [address] [place], [place] [address]    Report Status 10/26/2023 FINAL  Final  Respiratory (~20 pathogens) panel by PCR     Status: None   Collection Time: 10/21/23  6:22 PM   Specimen: Nasopharyngeal Swab; Respiratory  Result Value Ref Range Status   Adenovirus NOT DETECTED NOT DETECTED Final   Coronavirus 229E NOT DETECTED NOT DETECTED Final    Comment: (NOTE) The Coronavirus on the Respiratory Panel, DOES NOT test for the novel  Coronavirus (2019 nCoV)    Coronavirus HKU1 NOT DETECTED NOT DETECTED Final   Coronavirus NL63 NOT DETECTED NOT DETECTED Final   Coronavirus OC43 NOT DETECTED NOT DETECTED Final   Metapneumovirus NOT DETECTED NOT DETECTED Final   Rhinovirus / Enterovirus NOT DETECTED NOT DETECTED Final   Influenza A NOT DETECTED NOT DETECTED Final   Influenza B NOT DETECTED NOT DETECTED Final   Parainfluenza Virus 1 NOT DETECTED NOT DETECTED Final   Parainfluenza Virus 2 NOT DETECTED NOT DETECTED Final   Parainfluenza Virus 3 NOT DETECTED NOT DETECTED Final   Parainfluenza Virus 4 NOT DETECTED NOT DETECTED Final   Respiratory Syncytial Virus NOT DETECTED NOT DETECTED Final   Bordetella pertussis NOT DETECTED NOT DETECTED Final   Bordetella Parapertussis NOT DETECTED NOT DETECTED Final   Chlamydophila pneumoniae NOT DETECTED NOT DETECTED Final   Mycoplasma pneumoniae NOT DETECTED NOT DETECTED Final    Comment: Performed at [facility], [address], [place], [place] [address]  Expectorated Sputum Assessment w Gram Stain, Rflx to Resp Cult     Status: None    Collection Time: 10/23/23  9:51 AM   Specimen: Sputum  Result Value Ref Range Status   Specimen Description SPUTUM  Final   Special Requests NONE  Final   Sputum evaluation   Final    THIS SPECIMEN IS ACCEPTABLE FOR SPUTUM CULTURE Performed at [facility]  [facility], [address] [place], [place] [address]    Report Status 10/23/2023 FINAL  Final  Culture, Respiratory w Gram Stain     Status: None   Collection Time: 10/23/23  9:51 AM   Specimen: SPU  Result Value Ref Range Status   Specimen Description   Final    SPUTUM Performed at [facility], [address], [place], [place] [address]    Special Requests   Final    NONE Reflexed from [id number] Performed at [facility], [address] [place], [place] [address]    Gram Stain   Final    RARE WBC PRESENT,BOTH PMN AND MONONUCLEAR RARE GRAM POSITIVE COCCI IN CLUSTERS RARE BUDDING YEAST SEEN    Culture   Final    Normal respiratory flora-no Staph aureus or Pseudomonas seen Performed at [facility], [address], [place], [place] [address]    Report Status 10/26/2023 FINAL  Final    Time coordinating discharge: Over 30 minutes  Leeroy Bock, MD  [facility] 10/29/2023, 2:00 PM

## 2023-10-28 NOTE — Plan of Care (Signed)
  Problem: Education: Goal: Knowledge of General Education information will improve Description: Including pain rating scale, medication(s)/side effects and non-pharmacologic comfort measures Outcome: Progressing   Problem: Health Behavior/Discharge Planning: Goal: Ability to manage health-related needs will improve Outcome: Progressing   Problem: Clinical Measurements: Goal: Ability to maintain clinical measurements within normal limits will improve Outcome: Progressing Goal: Will remain free from infection Outcome: Progressing Goal: Diagnostic test results will improve Outcome: Progressing Goal: Respiratory complications will improve Outcome: Progressing Goal: Cardiovascular complication will be avoided Outcome: Progressing   Problem: Activity: Goal: Risk for activity intolerance will decrease Outcome: Progressing   Problem: Nutrition: Goal: Adequate nutrition will be maintained Outcome: Progressing   Problem: Coping: Goal: Level of anxiety will decrease Outcome: Progressing   Problem: Elimination: Goal: Will not experience complications related to bowel motility Outcome: Progressing Goal: Will not experience complications related to urinary retention Outcome: Progressing   Problem: Pain Managment: Goal: General experience of comfort will improve and/or be controlled Outcome: Progressing   Problem: Safety: Goal: Ability to remain free from injury will improve Outcome: Progressing   Problem: Skin Integrity: Goal: Risk for impaired skin integrity will decrease Outcome: Progressing   Problem: Education: Goal: Knowledge of disease or condition will improve Outcome: Progressing Goal: Knowledge of the prescribed therapeutic regimen will improve Outcome: Progressing Goal: Individualized Educational Video(s) Outcome: Progressing   Problem: Activity: Goal: Ability to tolerate increased activity will improve Outcome: Progressing Goal: Will verbalize the  importance of balancing activity with adequate rest periods Outcome: Progressing   Problem: Respiratory: Goal: Ability to maintain a clear airway will improve Outcome: Progressing Goal: Levels of oxygenation will improve Outcome: Progressing Goal: Ability to maintain adequate ventilation will improve Outcome: Progressing   Problem: Education: Goal: Knowledge of disease or condition will improve Outcome: Progressing Goal: Understanding of medication regimen will improve Outcome: Progressing Goal: Individualized Educational Video(s) Outcome: Progressing   Problem: Activity: Goal: Ability to tolerate increased activity will improve Outcome: Progressing   Problem: Cardiac: Goal: Ability to achieve and maintain adequate cardiopulmonary perfusion will improve Outcome: Progressing   Problem: Health Behavior/Discharge Planning: Goal: Ability to safely manage health-related needs after discharge will improve Outcome: Progressing

## 2023-10-28 NOTE — Progress Notes (Signed)
 Physical Therapy Treatment Patient Details Name: [name] [name]. MRN: [id number] DOB: 04/29/1941 Today's Date: 10/28/2023   History of Present Illness 83 y/o male presented to ED on 10/21/23 for SOB and wheezing. Admitted for acute on chronic hypoxic and hypercapnic respiratory failure. PMH: Advanced COPD on 3-4 L O2 at home, PAF, HTN, chronic HFpEF, CAD    PT Comments  Patient alert, agreeable to PT, on 3L at rest. Pt reported he uses 4L for mobility, placed on 4L. Pt spO2 reading >95% for all mobility today, but pt reported increasing SOB, asked for 5L, and RN notified of pt status. Respiratory in room at end of session to assist pt with donning bipap. The pt was CGA with RW for sit <> stand and step pivot, but unable to tolerate any further mobility besides BSC transfer due to fatigue, SOB. MaxA for pericare in standing. The patient would benefit from further skilled PT intervention to continue to progress towards goals.    If plan is discharge home, recommend the following: Assistance with cooking/housework;Assist for transportation;Help with stairs or ramp for entrance;A little help with walking and/or transfers;A little help with bathing/dressing/bathroom   Can travel by private vehicle        Equipment Recommendations  None recommended by PT    Recommendations for Other Services       Precautions / Restrictions Precautions Precautions: Fall Recall of Precautions/Restrictions: Intact Precaution/Restrictions Comments: O2 Restrictions Weight Bearing Restrictions Per Provider Order: No     Mobility  Bed Mobility Overal bed mobility: Needs Assistance Bed Mobility: Supine to Sit     Supine to sit: Supervision, HOB elevated          Transfers Overall transfer level: Needs assistance Equipment used: Rolling walker (2 wheels) Transfers: Bed to chair/wheelchair/BSC Sit to Stand: Contact guard assist   Step pivot transfers: Contact guard assist       General  transfer comment: CGA for safety    Ambulation/Gait               General Gait Details: deferred due to SOB, wanting bipap, respiratory in room   Stairs             Wheelchair Mobility     Tilt Bed    Modified Rankin (Stroke Patients Only)       Balance Overall balance assessment: Needs assistance Sitting-balance support: No upper extremity supported, Feet supported Sitting balance-[name]: Fair     Standing balance support: Bilateral upper extremity supported, Reliant on assistive device for balance Standing balance-[name]: Poor                              Communication    Cognition Arousal: Alert Behavior During Therapy: WFL for tasks assessed/performed   PT - Cognitive impairments: No apparent impairments                         Following commands: Intact      Cueing Cueing Techniques: Verbal cues  Exercises      General Comments        Pertinent Vitals/Pain Pain Assessment Pain Assessment: No/denies pain    Home Living                          Prior Function            PT Goals (current goals can  now be found in the care plan section) Progress towards PT goals: Progressing toward goals    Frequency    Min 2X/week      PT Plan      Co-evaluation              AM-PAC PT "6 Clicks" Mobility   Outcome Measure  Help needed turning from your back to your side while in a flat bed without using bedrails?: None Help needed moving from lying on your back to sitting on the side of a flat bed without using bedrails?: A Little Help needed moving to and from a bed to a chair (including a wheelchair)?: A Little Help needed standing up from a chair using your arms (e.g., wheelchair or bedside chair)?: A Little Help needed to walk in hospital room?: A Lot Help needed climbing 3-5 steps with a railing? : A Lot 6 Click Score: 17    End of Session Equipment Utilized During Treatment:  Oxygen Activity Tolerance: Patient limited by fatigue;Other (comment) (limited by SOB) Patient left: in chair;with call bell/phone within reach;with chair alarm set Nurse Communication: Mobility status PT Visit Diagnosis: Muscle weakness (generalized) (M62.81);Unsteadiness on feet (R26.81);Other abnormalities of gait and mobility (R26.89)     Time: 1010-1027 PT Time Calculation (min) (ACUTE ONLY): 17 min  Charges:    $Therapeutic Activity: 8-22 mins PT General Charges $$ ACUTE PT VISIT: 1 Visit                     Olga Coaster PT, DPT 11:37 AM,10/28/23

## 2023-10-28 NOTE — Progress Notes (Signed)
 Patient has refused bipap for tonight. States he wore appx an hour last night then took off.  Hopeful that equipment he has with adapt will be more comfortable once he gets home to use it.

## 2023-10-28 NOTE — Discharge Instructions (Signed)
 Please follow up with your pulmonologist at earliest available appointment Can continue to use morphine as needed as well as the BIPAP nightly and as needed for increased work with breathing Continue your steroid course until completed.  Continue your scheduled breathing treatments  I recommend following up with hospice as early as possible

## 2023-10-28 NOTE — Progress Notes (Signed)
 PROGRESS NOTE  [name].    DOB: 14-Dec-1940, 83 y.o.  ZOX:[id number]    Code Status: Limited: Do not attempt resuscitation (DNR) -DNR-LIMITED -Do Not Intubate/DNI    DOA: 10/21/2023   LOS: 7   Brief hospital course  [name] [name]. is a 83 y.o. 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   03/24: admitted to hospitalist service for COPD exacerbation Requiring BiPAP in ED.  presently: still very SOB with minimal exertion. Very deconditioned. continue tx. Pulmonology consulted and recommending morphine PRN and NIV. Currently awaiting discharge home once respiratory status stabilizes and home NIV can be delivered. As soon as 4/1. Patient expressed interest in comfort care. Palliative care is consulted.   Assessment & Plan  Principal Problem:   COPD (chronic obstructive pulmonary disease) (HCC) Active Problems:   Acute on chronic respiratory failure with hypoxia (HCC)   COPD with acute exacerbation (HCC)  Acute on chronic hypoxic and hypercapnic respiratory failure Acute COPD exacerbation Patient received ABG while on Bipap  to evaluate for qualifications for home NIV. pCO2 70. TOC consulted for arrangements. Dc plans for today delayed due to instability for transferring from bed. Would not be able to transport home. He expresses desire for comfort care and would like to talk to palliative care.  Continue BiPAP support prn Completed Doxycycline Steroids - IV solumedrol --> po prednisone Continue home Breztri  Duoneb q6h  Xopenex neb q6h prn  Add pulmicort neb scheduled bid and increased dose Mucinex  Incentive spirometry  Ambulate as tolerated F/u pulmonology consult Continue morphine PRN  GOC- palliative consulted. Patient and wife agreeable  to information about hospice vs comfort care. Patient expressed that he doesn't want to prolong his life.    Acute on chronic HFpEF without decompensation S/p IV diuresis  Strict I&O Continue home furosmide po. Increased dose. Appears euvolemic on exam Echocardiogram was done recently, will not repeat echo at this time   PAF with question of RVR- rate controlled Continue home Cardizem and Tikosyn Continue Eliquis   Deconditioning PT/OT evaluation   GERD PPI    No concerns based on BMI: Body mass index is 21.22 kg/m.  Body mass index is 21.22 kg/m.  VTE ppx:  apixaban (ELIQUIS) tablet 5 mg   Diet:     Diet   Diet regular Room service appropriate? Yes; Fluid consistency: Thin   Consultants: Pulmonology    Subjective 10/28/23    Pt stated that he felt well this morning while laying in bed. Minimal movement to try to stand at edge of bed left him unstable with hypoxia, hypotension, bradycardia. He recovered once seated an placed on bipap. He is not stable for transfer to home. His wife is very upset that he had not been able to go home yet. He says he wants comfort care. Wife doesn't think that's what he really wants and talks to him about his quality of life.    Objective   Vitals:   10/27/23 1956 10/27/23 2135 10/27/23 2200 10/28/23 0503  BP: (!) 112/53   123/63  Pulse: 89   76  Resp: 20     Temp: (!) 97.5 F (36.4 C)   98.3 F (36.8 C)  TempSrc: Oral   Oral  SpO2: 100% 98% 100% 100%  Weight:      Height:        Intake/Output Summary (Last 24 hours) at 10/28/2023 0757 Last data filed at 10/28/2023 0520 Gross per 24 hour  Intake 360 ml  Output 850 ml  Net -490 ml   Filed Weights   10/21/23 1210  Weight: 68 kg    Physical Exam:  General: awake, alert, NAD HEENT: atraumatic, clear conjunctiva, anicteric sclera, MMM, hearing grossly normal Respiratory: increased effort. mild inhalation wheezing. No rales or rhonchi. Pursed lip breathing Cardiovascular:  quick capillary refill, normal S1/S2, RRR, no JVD, murmurs Nervous: A&O x3. no gross focal neurologic deficits, normal speech Extremities: moves all equally, no edema, normal tone Skin: dry, intact, normal temperature, normal color. No rashes, lesions or ulcers on exposed skin Psychiatry: normal mood, congruent affect  UPDATE: after patient attempted to stand, he was on bipap on reassessment and lethargic. Speaking in short sentences.   Labs   I have personally reviewed the following labs and imaging studies CBC    Component Value Date/Time   WBC 10.4 10/28/2023 0326   RBC 3.64 (L) 10/28/2023 0326   HGB 10.4 (L) 10/28/2023 0326   HGB 15.3 05/02/2023 1445   HCT 33.3 (L) 10/28/2023 0326   HCT 46.9 05/02/2023 1445   PLT 309 10/28/2023 0326   PLT 226 05/02/2023 1445   MCV 91.5 10/28/2023 0326   MCV 94 05/02/2023 1445   MCH 28.6 10/28/2023 0326   MCHC 31.2 10/28/2023 0326   RDW 14.9 10/28/2023 0326   RDW 13.1 05/02/2023 1445   LYMPHSABS 0.7 09/01/2023 0535   LYMPHSABS 1.4 04/09/2018 1650   MONOABS 0.8 09/01/2023 0535   EOSABS 0.0 09/01/2023 0535   EOSABS 0.2 04/09/2018 1650   BASOSABS 0.0 09/01/2023 0535   BASOSABS 0.1 04/09/2018 1650      Latest Ref Rng & Units 10/28/2023    3:26 AM 10/27/2023    5:47 AM 10/26/2023    4:35 AM  BMP  Glucose 70 - 99 mg/dL 87  94  784   BUN 8 - 23 mg/dL 27  31  29    Creatinine 0.61 - 1.24 mg/dL 6.96  2.95  2.84   Sodium 135 - 145 mmol/L 138  138  139   Potassium 3.5 - 5.1 mmol/L 4.3  4.4  4.6   Chloride 98 - 111 mmol/L 97  98  98   CO2 22 - 32 mmol/L 33  33  37   Calcium 8.9 - 10.3 mg/dL 8.3  8.4  8.6     No results found.   Disposition Plan & Communication  Patient status: Inpatient  Admitted From: Home Planned disposition location: TBD Anticipated discharge date: TBD  Family Communication: wife at bedside    Author: Leeroy Bock, DO [facility] 10/28/2023, 7:57 AM   Available by Epic secure chat 7AM-7PM. If  7PM-7AM, please contact night-coverage.  [facility] contact information found on [email or web address].

## 2023-10-29 DIAGNOSIS — J9621 Acute and chronic respiratory failure with hypoxia: Secondary | ICD-10-CM

## 2023-10-29 DIAGNOSIS — Z515 Encounter for palliative care: Secondary | ICD-10-CM | POA: Diagnosis not present

## 2023-10-29 DIAGNOSIS — J432 Centrilobular emphysema: Secondary | ICD-10-CM | POA: Diagnosis not present

## 2023-10-29 DIAGNOSIS — J441 Chronic obstructive pulmonary disease with (acute) exacerbation: Secondary | ICD-10-CM | POA: Diagnosis not present

## 2023-10-29 DIAGNOSIS — R0689 Other abnormalities of breathing: Secondary | ICD-10-CM | POA: Diagnosis not present

## 2023-10-29 MED ORDER — MORPHINE SULFATE (PF) 2 MG/ML IV SOLN
2.0000 mg | INTRAVENOUS | Status: DC
  Administered 2023-10-29 (×2): 2 mg via INTRAVENOUS
  Filled 2023-10-29 (×2): qty 1

## 2023-10-29 NOTE — Consult Note (Signed)
 Consultation Note Date: 10/29/2023 at 1000  Patient Name: [name] [name].  DOB: 26-Jul-1941  MRN: [id number]  Age / Sex: 83 y.o., male  PCP: [name], MD Referring Physician: Leeroy Bock, MD  HPI/Patient Profile: 83 y.o. male  with past medical history of steroid-dependent COPD, chronic hypoxic respiratory failure (threes 3-4 L as needed), HTN, chronic HFpEF, CAD s/p stenting (2019), PAF (Tikosyn, Eliquis), and hospitalization in late January of this year for AECOPD and CHF exacerbation admitted on 10/21/2023 with cough, wheezing, and shortness of breath.  Course of hospitalization patient has required BiPAP.  He is very deconditioned.  Pulmonology was consulted and recommended morphine as needed and NIV.  NIV delivered to patient's home.  Awaiting discharge to home.  PMT was consulted to discuss boundaries and goals of care.  Of note, patient is familiar to Clinical research associate as I met with patient and wife during January hospitalization.   Clinical Assessment and Goals of Care: Extensive chart review completed prior to meeting patient including labs, vital signs, imaging, progress notes, orders, and available advanced directive documents from current and previous encounters. I then met with patient and his wife at bedside to discuss diagnosis prognosis, GOC, EOL wishes, disposition and options.  I introduced Palliative Medicine as specialized medical care for people living with serious illness. It focuses on providing relief from the symptoms and stress of a serious illness. The goal is to improve quality of life for both the patient and the family.  We discussed a brief life review of the patient.  Since meeting in January, patient was able to attend the family wedding in March that he was remaining hopeful for.  However, the day after the wedding was when he was brought to the hospital.  As far as  functional and nutritional status he endorses a significant decline since previous hospitalization.  He shares he cannot walk to the bathroom without feeling winded.  He endorses feeling short of breath but experiencing immense relief with use of morphine.  We discussed patient's current illness and what it means in the larger context of patient's on-going co-morbidities.  Discussed cyclical nature of COPD exacerbations in length between exacerbations a shortening.  Discussed decline in functional status is significant contributor to patient's overall prognosis.  Natural disease trajectory and expectations at EOL were discussed.  I attempted to elicit values and goals of care important to the patient.  He again highlights that he would never want to be in pain or gasping for air when he passes.  He remains in agreement with DNR and limited interventions.  He would never want artificial means to sustain his life.  He continues to never be accepting of ventilatory support. Goals and wishes as outlined in MOST in ACP tab of epic remain patient's current wishes.  The difference between aggressive medical intervention and comfort care was considered in light of the patient's goals of care.  The difference between palliative medicine and hospice services discussed in detail.  Comfort care also detailed extensively.  Space and opportunity provided for patient and wife to ask questions and share their thoughts.  They request that return bedside later when their son is at bedside.  I returned to bedside a few hours later and met with patient, his wife, and his son.  Again discussed next steps.  Discussed pros and cons of continue with current plan of care, shifting to comfort focused care, enacting hospice benefits, and returning to [facility] with hospice services to follow.   . Family had several questions in regards to what services would still be available if they enacted his hospice benefits.  TOC contacted  and spoke with family.  Discussed medications used with aggressive symptom management and various modalities available to keep the patient comfortable at home.  Discussed aging in place versus calling 911 to rushed back to the hospital.  Ultimately, patient has decided to discharge home without enacting hospice benefits at this time.  They would like to speak with people at [facility] in regards to insurance coverage.  I shared that patient does not need a hospitalization or require any sort of acute event in order to enact his hospice benefits.  Questions and concerns were addressed.  Plan remains for patient to discharge after speaking with TOC today.  PMT remains labile to patient and family throughout his hospitalization.  Please reengage if goals change, at patient/family's request, or if patient's health deteriorates prior to discharge.  Primary Decision Maker PATIENT  Physical Exam Vitals reviewed.  Constitutional:      General: He is not in acute distress.    Appearance: He is normal weight. He is not ill-appearing.  HENT:     Head: Normocephalic.  Cardiovascular:     Rate and Rhythm: Normal rate.  Pulmonary:     Breath sounds: Examination of the right-lower field reveals decreased breath sounds. Examination of the left-lower field reveals decreased breath sounds. Decreased breath sounds and wheezing present.  Skin:    General: Skin is warm and dry.  Neurological:     Mental Status: He is alert and oriented to person, place, and time.  Psychiatric:        Mood and Affect: Mood normal. Mood is not anxious.        Behavior: Behavior normal. Behavior is not agitated.     Palliative Assessment/Data:50%     Thank you for this consult. Palliative medicine will continue to follow and assist holistically.   Time Total: 120 minutes  Time spent includes: Detailed review of medical records (labs, imaging, vital signs), medically appropriate exam (mental status, respiratory,  cardiac, skin), discussed with treatment team, counseling and educating patient, family and staff, documenting clinical information, medication management and coordination of care.  Signed by: Georgiann Cocker, DNP, FNP-BC Palliative Medicine   Please contact Palliative Medicine Team providers via [facility] for questions and concerns.

## 2023-10-29 NOTE — Consult Note (Signed)
 Pharmacy: Dofetilide (Tikosyn) - Follow Up Assessment and Electrolyte Replacement  Assessment: 83 yo M with PMH including pAF, COPD, HTN, HFpEF, CAD s/p stenting (2019) presenting with COPDe. Patient is on dofetilide for Afib and pharmacy has been consulting for managing electrolytes, most notably magnesium and potassium, and monitoring drug-drug interactions during admission. Baseline Qtc <500. No new drug-drug interactions noted.   Labs:    Component Value Date/Time   K 4.3 10/28/2023 0326   MG 2.1 10/28/2023 0326    Plan: Potassium: K >/= 4: No additional supplementation needed  Magnesium: Mg > 2: No additional supplementation needed  Recheck electrolytes in AM. Continue to monitor for new drug-drug interactions and Qtc prolongation.  Thank you for involving pharmacy in this patient's care.   Traniya Prichett Rodriguez-Guzman PharmD, BCPS 10/29/2023 8:54 AM

## 2023-10-29 NOTE — Evaluation (Signed)
 Occupational Therapy Evaluation Patient Details Name: [name] [name]. MRN: [id number] DOB: 07-13-1941 Today's Date: 10/29/2023   History of Present Illness   83 y/o male presented to ED on 10/21/23 for SOB and wheezing. Admitted for acute on chronic hypoxic and hypercapnic respiratory failure. PMH: Advanced COPD on 3-4 L O2 at home, PAF, HTN, chronic HFpEF, CAD     Clinical Impressions PTA, pt lives with spouse at [facility], was participating in OT/PT several times weekly. Pt uses 3-4L O2 at baseline, uses transport chair for household mobility and has limited endurance for ADLs/mobility due to increased WOB/SOB upon activity. Pt reports he walks ~20 ft at home.   Pt appears to be close to physical baseline, moves quickly for activity which immediately results in increased work of breathing. Performs bed mobility with supervision, stands impulsively at EOB without physical assist and supervision for safety, requires cues for energy conservation strategies. Pt declines further mobility participation due to arrival of breakfast. Anticipate pt requiring CGA for functional transfers to recliner/BSC with RW, and up to MOD A for LB ADLs due to decreased activity tolerance. Pt hopeful to discharge today with [facility] therapies. Educated on role and purpose of OT in acute care, and discussed activity pacing with pt. Pt would benefit from skilled OT services to address noted impairments and functional limitations (see below for any additional details) in order to maximize safety and independence while minimizing falls risk and caregiver burden. Anticipate the need for follow up [facility] OT services upon acute hospital DC.      If plan is discharge home, recommend the following:   A little help with walking and/or transfers;A little help with bathing/dressing/bathroom;Assistance with cooking/housework;Help with stairs or ramp for entrance     Functional Status Assessment   Patient has had a recent  decline in their functional status and demonstrates the ability to make significant improvements in function in a reasonable and predictable amount of time.     Equipment Recommendations   None recommended by OT (has all necessary DME)      Precautions/Restrictions   Precautions Precautions: Fall Restrictions Weight Bearing Restrictions Per Provider Order: No     Mobility Bed Mobility Overal bed mobility: Needs Assistance Bed Mobility: Supine to Sit     Supine to sit: Supervision, HOB elevated     General bed mobility comments: pt moves quickly despite increased WOB, edu on task pacing    Transfers Overall transfer level: Needs assistance Equipment used: Rolling walker (2 wheels) Transfers: Sit to/from Stand Sit to Stand: Supervision           General transfer comment: Pt stands impulsively from EOB without waiting for therapist of manage lines, no physical assist required      Balance Overall balance assessment: Needs assistance Sitting-balance support: No upper extremity supported, Feet supported Sitting balance-[name]: Fair     Standing balance support: Bilateral upper extremity supported, Reliant on assistive device for balance Standing balance-[name]: Poor Standing balance comment: RW for support in standing                           ADL either performed or assessed with clinical judgement   ADL Overall ADL's : Needs assistance/impaired Eating/Feeding: Bed level;Independent                                   Functional  mobility during ADLs: Cueing for sequencing;Cueing for safety;Supervision/safety;Rolling walker (2 wheels) General ADL Comments: Anticipate pt requiring cues for energy conservation techniques and physical assist due to low endurance.     Vision Baseline Vision/History: 1 Wears glasses Ability to See in Adequate Light: 0 Adequate Patient Visual Report: No change from baseline            Extremity/Trunk Assessment Upper Extremity Assessment Upper Extremity Assessment: Overall WFL for tasks assessed   Lower Extremity Assessment Lower Extremity Assessment: Defer to PT evaluation   Cervical / Trunk Assessment Cervical / Trunk Assessment: Normal   Communication Communication Communication: No apparent difficulties   Cognition Arousal: Alert Behavior During Therapy: WFL for tasks assessed/performed                                 Following commands: Intact       Cueing  General Comments   Cueing Techniques: Verbal cues  On 3L/min upon arrival with SpO2 at 100%, increased to 4L/min prior to activity (per pt request which is his baseline), increased SOB/WOB with standing attempt SpO2 100%   Exercises     Shoulder Instructions      Home Living Family/patient expects to be discharged to:: Private residence Living Arrangements: Spouse/significant other Available Help at Discharge: Family;Available PRN/intermittently Type of Home: Independent living facility Home Access: Level entry     Home Layout: One level     Bathroom Shower/Tub: Producer, television/film/video: Standard     Home Equipment: Rollator (4 wheels);Shower seat;Transport chair;Electric scooter   Additional Comments: Agricultural consultant.      Prior Functioning/Environment Prior Level of Function : Driving;Needs assist             Mobility Comments: Uses transport chair in home (uses LE's to propel self or his wife pushes him); uses electric scooter in community; goes to PT 3x/week and uses rollator to walk.  Uses 3 L supplemental O2 at rest and 4 L with activity.  Goes to gym 2 days/week. ADLs Comments: Pt reports having aide come 5x/wk for bathing assist.    OT Problem List: Decreased activity tolerance;Decreased strength;Decreased safety awareness;Cardiopulmonary status limiting activity;Decreased knowledge of precautions;Impaired balance (sitting and/or  standing);Decreased knowledge of use of DME or AE   OT Treatment/Interventions: Self-care/ADL training;Therapeutic exercise;Neuromuscular education;DME and/or AE instruction;Energy conservation;Therapeutic activities;Patient/family education;Balance training      OT Goals(Current goals can be found in the care plan section)   Acute Rehab OT Goals OT Goal Formulation: With patient Time For Goal Achievement: 11/12/23 Potential to Achieve Goals: Fair   OT Frequency:  Min 2X/week       AM-PAC OT "6 Clicks" Daily Activity     Outcome Measure Help from another person eating meals?: None Help from another person taking care of personal grooming?: None Help from another person toileting, which includes using toliet, bedpan, or urinal?: A Little Help from another person bathing (including washing, rinsing, drying)?: A Little Help from another person to put on and taking off regular upper body clothing?: A Little Help from another person to put on and taking off regular lower body clothing?: A Little 6 Click Score: 20   End of Session Equipment Utilized During Treatment: Oxygen;Rolling walker (2 wheels) Nurse Communication: Mobility status  Activity Tolerance: Patient tolerated treatment well Patient left: in bed;with call bell/phone within reach;with bed alarm set  OT Visit Diagnosis: Unsteadiness on feet (R26.81);Other  abnormalities of gait and mobility (R26.89)                Time: 5621-3086 OT Time Calculation (min): 33 min Charges:  OT General Charges $OT Visit: 1 Visit OT Evaluation $OT Eval Low Complexity: 1 Low OT Treatments $Self Care/Home Management : 8-22 mins Cruze Zingaro L. Dajuana Palen, OTR/L  10/29/23, 9:36 AM

## 2023-10-29 NOTE — Plan of Care (Signed)
  Problem: Clinical Measurements: Goal: Cardiovascular complication will be avoided Outcome: Progressing   Problem: Coping: Goal: Level of anxiety will decrease Outcome: Progressing   Problem: Elimination: Goal: Will not experience complications related to urinary retention Outcome: Progressing   Problem: Pain Managment: Goal: General experience of comfort will improve and/or be controlled Outcome: Progressing   Problem: Skin Integrity: Goal: Risk for impaired skin integrity will decrease Outcome: Progressing

## 2023-10-29 NOTE — Progress Notes (Incomplete)
 PROGRESS NOTE  [name].    DOB: [birth year], 83 y.o.  WNU:[id number]    Code Status: Limited: Do not attempt resuscitation (DNR) -DNR-LIMITED -Do Not Intubate/DNI    DOA: 10/21/2023   LOS: 8   Brief hospital course  [name] [name]. is a 83 y.o. 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   03/24: admitted to hospitalist service for COPD exacerbation Requiring BiPAP in ED.  presently: still very SOB with minimal exertion. Very deconditioned. continue tx. Pulmonology consulted and recommending morphine PRN and NIV. Currently awaiting discharge home once respiratory status stabilizes and home NIV can be delivered. As soon as 4/1. Patient expressed interest in comfort care. Palliative care is consulted.   Assessment & Plan  Principal Problem:   COPD (chronic obstructive pulmonary disease) (HCC) Active Problems:   Acute on chronic respiratory failure with hypoxia (HCC)   COPD with acute exacerbation (HCC)   Hypercarbia   COPD exacerbation (HCC)  Acute on chronic hypoxic and hypercapnic respiratory failure Acute COPD exacerbation Patient received ABG while on Bipap  to evaluate for qualifications for home NIV. pCO2 70. TOC consulted for arrangements. Dc plans for today delayed due to instability for transferring from bed. Would not be able to transport home. He expresses desire for comfort care and would like to talk to palliative care.  Continue BiPAP support prn Completed Doxycycline Steroids - IV solumedrol --> po prednisone Continue home Breztri  Duoneb q6h  Xopenex neb q6h prn  Add pulmicort neb scheduled bid and increased dose Mucinex  Incentive spirometry  Ambulate as tolerated F/u pulmonology consult Continue morphine PRN  GOC-  palliative consulted. Patient and wife agreeable to information about hospice vs comfort care. Patient expressed that he doesn't want to prolong his life.    Acute on chronic HFpEF without decompensation S/p IV diuresis  Strict I&O Continue home furosmide po. Increased dose. Appears euvolemic on exam Echocardiogram was done recently, will not repeat echo at this time   PAF with question of RVR- rate controlled Continue home Cardizem and Tikosyn Continue Eliquis   Deconditioning PT/OT evaluation   GERD PPI    No concerns based on BMI: Body mass index is 21.22 kg/m.  Body mass index is 21.22 kg/m.  VTE ppx:  apixaban (ELIQUIS) tablet 5 mg   Diet:     Diet   Diet regular Room service appropriate? Yes; Fluid consistency: Thin   Consultants: Pulmonology    Subjective 10/29/23    Pt stated that he felt well this morning while laying in bed. Minimal movement to try to stand at edge of bed left him unstable with hypoxia, hypotension, bradycardia. He recovered once seated an placed on bipap. He is not stable for transfer to home. His wife is very upset that he had not been able to go home yet. He says he wants comfort care. Wife doesn't think that's what he really wants and talks to him about his quality of life.    Objective   Vitals:   10/28/23 2111 10/28/23 2120 10/28/23 2354 10/29/23 0736  BP: (!) 108/49  (!) 101/53 (!) 115/50  Pulse: 88  91 81  Resp: 18  18 18   Temp:   97.7 F (36.5 C) 98.9 F (37.2 C)  TempSrc:  SpO2: 100% 98% 95% 91%  Weight:      Height:        Intake/Output Summary (Last 24 hours) at 10/29/2023 0754 Last data filed at 10/29/2023 0334 Gross per 24 hour  Intake 240 ml  Output 1075 ml  Net -835 ml   Filed Weights   10/21/23 1210  Weight: 68 kg    Physical Exam:  General: awake, alert, NAD HEENT: atraumatic, clear conjunctiva, anicteric sclera, MMM, hearing grossly normal Respiratory: increased effort. mild inhalation wheezing. No  rales or rhonchi. Pursed lip breathing Cardiovascular: quick capillary refill, normal S1/S2, RRR, no JVD, murmurs Nervous: A&O x3. no gross focal neurologic deficits, normal speech Extremities: moves all equally, no edema, normal tone Skin: dry, intact, normal temperature, normal color. No rashes, lesions or ulcers on exposed skin Psychiatry: normal mood, congruent affect  UPDATE: after patient attempted to stand, he was on bipap on reassessment and lethargic. Speaking in short sentences.   Labs   I have personally reviewed the following labs and imaging studies CBC    Component Value Date/Time   WBC 10.4 10/28/2023 0326   RBC 3.64 (L) 10/28/2023 0326   HGB 10.4 (L) 10/28/2023 0326   HGB 15.3 05/02/2023 1445   HCT 33.3 (L) 10/28/2023 0326   HCT 46.9 05/02/2023 1445   PLT 309 10/28/2023 0326   PLT 226 05/02/2023 1445   MCV 91.5 10/28/2023 0326   MCV 94 05/02/2023 1445   MCH 28.6 10/28/2023 0326   MCHC 31.2 10/28/2023 0326   RDW 14.9 10/28/2023 0326   RDW 13.1 05/02/2023 1445   LYMPHSABS 0.7 09/01/2023 0535   LYMPHSABS 1.4 04/09/2018 1650   MONOABS 0.8 09/01/2023 0535   EOSABS 0.0 09/01/2023 0535   EOSABS 0.2 04/09/2018 1650   BASOSABS 0.0 09/01/2023 0535   BASOSABS 0.1 04/09/2018 1650      Latest Ref Rng & Units 10/28/2023    3:26 AM 10/27/2023    5:47 AM 10/26/2023    4:35 AM  BMP  Glucose 70 - 99 mg/dL 87  94  846   BUN 8 - 23 mg/dL 27  31  29    Creatinine 0.61 - 1.24 mg/dL 9.62  9.52  8.41   Sodium 135 - 145 mmol/L 138  138  139   Potassium 3.5 - 5.1 mmol/L 4.3  4.4  4.6   Chloride 98 - 111 mmol/L 97  98  98   CO2 22 - 32 mmol/L 33  33  37   Calcium 8.9 - 10.3 mg/dL 8.3  8.4  8.6     No results found.   Disposition Plan & Communication  Patient status: Inpatient  Admitted From: Home Planned disposition location: TBD Anticipated discharge date: TBD  Family Communication: wife at bedside    Author: Leeroy Bock, DO [facility] 10/29/2023, 7:54  AM   Available by Epic secure chat 7AM-7PM. If 7PM-7AM, please contact night-coverage.  [facility] contact information found on [email or web address].

## 2023-10-29 NOTE — Progress Notes (Signed)
 Patient refused 4 am VS. Patient requests to be left alone so he can sleep

## 2023-10-30 ENCOUNTER — Telehealth: Payer: Self-pay

## 2023-10-30 DIAGNOSIS — J9621 Acute and chronic respiratory failure with hypoxia: Secondary | ICD-10-CM | POA: Diagnosis not present

## 2023-10-30 DIAGNOSIS — R2681 Unsteadiness on feet: Secondary | ICD-10-CM | POA: Diagnosis not present

## 2023-10-30 DIAGNOSIS — M6281 Muscle weakness (generalized): Secondary | ICD-10-CM | POA: Diagnosis not present

## 2023-10-30 DIAGNOSIS — R2689 Other abnormalities of gait and mobility: Secondary | ICD-10-CM | POA: Diagnosis not present

## 2023-10-30 DIAGNOSIS — Z741 Need for assistance with personal care: Secondary | ICD-10-CM | POA: Diagnosis not present

## 2023-10-30 DIAGNOSIS — R278 Other lack of coordination: Secondary | ICD-10-CM | POA: Diagnosis not present

## 2023-10-30 NOTE — Telephone Encounter (Signed)
 This encounter was created in error - please disregard.

## 2023-10-30 NOTE — Patient Outreach (Signed)
 Care Coordination   Collaboration  Visit Note   10/30/2023 Name: [name] [name]. MRN: [id number] DOB: 04/27/1941  [name] Register. is a 83 y.o. year old male who sees [name], MD for primary care. Message received from [facility], RN transition of care stating patient is going on hospice.     Goals Addressed             This Visit's Progress    COMPLETED: Management and education of chronic /health conditions       Interventions Today    Flowsheet Row Most Recent Value  Chronic Disease   Chronic disease during today's visit Chronic Obstructive Pulmonary Disease (COPD), Other  [left calf and buttock wound]  General Interventions   General Interventions Discussed/Reviewed General Interventions Reviewed, Doctor Visits  [evaluation of current treatment plan for listed health conditions and adherence to plan as established by provider.  Assessed for COPD symptoms and wound assessment regarding healing and signs of infection.]  Doctor Visits Discussed/Reviewed Doctor Visits Reviewed  [reviewed upcoming provider office visits and confirmed patient has post hospital/ SNF follow up with primary care provider/ pulmonologist.]  Exercise Interventions   Exercise Discussed/Reviewed Physical Activity  [Assessed current physical activity. Assessed for PT/ OT/ SN services.]  Education Interventions   Education Provided Provided Education  [Reviewed COPD exacerbation symptoms and signs/ symptoms of infection. Advised to contact provider for noted symptoms or call 911 for severe symptoms. Confirmed patient wearing O2 24/7. Assessed oxygen saturation readings.]  Provided Verbal Education On Other  [Assessed for wound care advisement.]  Nutrition Interventions   Nutrition Discussed/Reviewed Nutrition Reviewed  [Discussed appetite and confirmed patient is eating ok.]  Pharmacy Interventions   Pharmacy Dicussed/Reviewed Pharmacy Topics Reviewed  [Attempted to review medications  with patient. Patient requested RN case manager review his medications with his wife who was not available at time of call.]              SDOH assessments and interventions completed:  No     Care Coordination Interventions:  Closed out due to patient going into hospice.    Follow up plan: No further intervention required.   Encounter Outcome:  Patient Visit Completed   George Ina RN, BSN, CCM [place]  [facility], Population Health Case Manager Phone: [phone]

## 2023-10-30 NOTE — Transitions of Care (Post Inpatient/ED Visit) (Signed)
   10/30/2023  Name: [name]. MRN: [id number] DOB: [birth year]  Today's TOC FU Call Status: Today's TOC FU Call Status:: Unsuccessful Call (1st Attempt) Unsuccessful Call (1st Attempt) Date: 10/30/23  Attempted to reach the patient regarding the most recent Inpatient/ED visit.  Follow Up Plan: No further outreach attempts will be made at this time. We have been unable to contact the patient. The patient plans to follow up with Hospice   Deidre Ala, BSN, RN   VBCI - [facility] RN Care Manager [phone]

## 2023-10-31 ENCOUNTER — Ambulatory Visit: Admitting: Physician Assistant

## 2023-11-01 ENCOUNTER — Encounter (INDEPENDENT_AMBULATORY_CARE_PROVIDER_SITE_OTHER)

## 2023-11-01 ENCOUNTER — Ambulatory Visit: Payer: Medicare Other | Admitting: Cardiology

## 2023-11-19 ENCOUNTER — Other Ambulatory Visit (INDEPENDENT_AMBULATORY_CARE_PROVIDER_SITE_OTHER): Payer: Self-pay | Admitting: Physician Assistant

## 2023-11-19 DIAGNOSIS — L97822 Non-pressure chronic ulcer of other part of left lower leg with fat layer exposed: Secondary | ICD-10-CM

## 2023-11-20 ENCOUNTER — Encounter: Payer: Self-pay | Admitting: Podiatry

## 2023-11-20 ENCOUNTER — Ambulatory Visit: Admitting: Podiatry

## 2023-11-20 DIAGNOSIS — L02612 Cutaneous abscess of left foot: Secondary | ICD-10-CM

## 2023-11-20 DIAGNOSIS — I739 Peripheral vascular disease, unspecified: Secondary | ICD-10-CM

## 2023-11-20 DIAGNOSIS — L03032 Cellulitis of left toe: Secondary | ICD-10-CM | POA: Diagnosis not present

## 2023-11-20 DIAGNOSIS — M25476 Effusion, unspecified foot: Secondary | ICD-10-CM | POA: Diagnosis not present

## 2023-11-20 NOTE — Progress Notes (Signed)
 He presents today concerned about the edema in the bilateral lower extremities states that he has been in the hospital as of late and had developed cellulitis from a lesion on his toe of his left foot.  States that he just wants his feet looked at to make sure that he does not go through that again continues to heal a wound by the head of the left fibula.  The nurse at [facility] is treating that slowly.  Objective: Vital signs are stable alert and oriented x 3 presents today with a oxygen  pump for pulmonary insufficiency.  He has considerable edema in the bilateral lower extremity no open lesions or wounds are identifiable nothing is not overly tender there is no signs of infection.  Ankle joints and joints distal good range of motion without crepitation.  Assessment: Edema bilateral lower extremity.  Plan: Follow-up with us  on an as-needed basis I recommended he continue compression hose and I did recommend that consider compression hose without the toes.

## 2023-11-21 NOTE — Addendum Note (Signed)
 Addended by: Sanda Crome on: 11/21/2023 08:37 AM   Modules accepted: Level of Service

## 2023-12-17 ENCOUNTER — Encounter (INDEPENDENT_AMBULATORY_CARE_PROVIDER_SITE_OTHER): Payer: Self-pay

## 2023-12-28 ENCOUNTER — Other Ambulatory Visit: Payer: Self-pay | Admitting: Medical

## 2024-01-06 ENCOUNTER — Telehealth: Payer: Self-pay | Admitting: Internal Medicine

## 2024-01-06 NOTE — Telephone Encounter (Signed)
 Patient called office has appointment scheduled for 6/16. Wanted to know if he needs to keep now that he is on hospice care.

## 2024-01-08 NOTE — Telephone Encounter (Signed)
 Called and spoke to wife on dpr . Gave all information will call if any questions.

## 2024-01-13 ENCOUNTER — Ambulatory Visit: Payer: Medicare Other | Admitting: Internal Medicine

## 2024-02-11 ENCOUNTER — Observation Stay (HOSPITAL_COMMUNITY)

## 2024-02-11 ENCOUNTER — Encounter (HOSPITAL_COMMUNITY): Payer: Self-pay | Admitting: Internal Medicine

## 2024-02-11 ENCOUNTER — Emergency Department (HOSPITAL_COMMUNITY)

## 2024-02-11 ENCOUNTER — Observation Stay (HOSPITAL_COMMUNITY)
Admission: EM | Admit: 2024-02-11 | Discharge: 2024-02-13 | Disposition: A | Discharge status: Home / Self Care | Attending: Emergency Medicine | Admitting: Emergency Medicine

## 2024-02-11 DIAGNOSIS — I480 Paroxysmal atrial fibrillation: Secondary | ICD-10-CM | POA: Diagnosis not present

## 2024-02-11 DIAGNOSIS — R0602 Shortness of breath: Secondary | ICD-10-CM | POA: Diagnosis not present

## 2024-02-11 DIAGNOSIS — J449 Chronic obstructive pulmonary disease, unspecified: Secondary | ICD-10-CM

## 2024-02-11 DIAGNOSIS — Z515 Encounter for palliative care: Secondary | ICD-10-CM | POA: Insufficient documentation

## 2024-02-11 DIAGNOSIS — E43 Unspecified severe protein-calorie malnutrition: Secondary | ICD-10-CM | POA: Diagnosis not present

## 2024-02-11 DIAGNOSIS — Z87891 Personal history of nicotine dependence: Secondary | ICD-10-CM | POA: Diagnosis not present

## 2024-02-11 DIAGNOSIS — N179 Acute kidney failure, unspecified: Secondary | ICD-10-CM | POA: Insufficient documentation

## 2024-02-11 DIAGNOSIS — Z66 Do not resuscitate: Secondary | ICD-10-CM | POA: Diagnosis not present

## 2024-02-11 DIAGNOSIS — J9601 Acute respiratory failure with hypoxia: Secondary | ICD-10-CM

## 2024-02-11 DIAGNOSIS — G9341 Metabolic encephalopathy: Secondary | ICD-10-CM | POA: Insufficient documentation

## 2024-02-11 DIAGNOSIS — Z7901 Long term (current) use of anticoagulants: Secondary | ICD-10-CM | POA: Insufficient documentation

## 2024-02-11 DIAGNOSIS — I5032 Chronic diastolic (congestive) heart failure: Secondary | ICD-10-CM | POA: Diagnosis not present

## 2024-02-11 DIAGNOSIS — J9621 Acute and chronic respiratory failure with hypoxia: Secondary | ICD-10-CM | POA: Diagnosis not present

## 2024-02-11 DIAGNOSIS — M79659 Pain in unspecified thigh: Secondary | ICD-10-CM | POA: Insufficient documentation

## 2024-02-11 DIAGNOSIS — I132 Hypertensive heart and chronic kidney disease with heart failure and with stage 5 chronic kidney disease, or end stage renal disease: Secondary | ICD-10-CM | POA: Insufficient documentation

## 2024-02-11 DIAGNOSIS — J9622 Acute and chronic respiratory failure with hypercapnia: Secondary | ICD-10-CM

## 2024-02-11 DIAGNOSIS — Z955 Presence of coronary angioplasty implant and graft: Secondary | ICD-10-CM | POA: Diagnosis not present

## 2024-02-11 DIAGNOSIS — I2510 Atherosclerotic heart disease of native coronary artery without angina pectoris: Secondary | ICD-10-CM | POA: Insufficient documentation

## 2024-02-11 DIAGNOSIS — R400 Somnolence: Secondary | ICD-10-CM | POA: Diagnosis not present

## 2024-02-11 DIAGNOSIS — D631 Anemia in chronic kidney disease: Secondary | ICD-10-CM | POA: Insufficient documentation

## 2024-02-11 DIAGNOSIS — R930 Abnormal findings on diagnostic imaging of skull and head, not elsewhere classified: Secondary | ICD-10-CM | POA: Diagnosis not present

## 2024-02-11 DIAGNOSIS — N186 End stage renal disease: Secondary | ICD-10-CM | POA: Diagnosis not present

## 2024-02-11 DIAGNOSIS — J9611 Chronic respiratory failure with hypoxia: Secondary | ICD-10-CM

## 2024-02-11 DIAGNOSIS — R4182 Altered mental status, unspecified: Principal | ICD-10-CM | POA: Diagnosis present

## 2024-02-11 DIAGNOSIS — I6782 Cerebral ischemia: Secondary | ICD-10-CM | POA: Diagnosis not present

## 2024-02-11 DIAGNOSIS — J9602 Acute respiratory failure with hypercapnia: Principal | ICD-10-CM

## 2024-02-11 LAB — CBC WITH DIFFERENTIAL/PLATELET
Abs Immature Granulocytes: 0.09 K/uL — ABNORMAL HIGH (ref 0.00–0.07)
Basophils Absolute: 0.1 K/uL (ref 0.0–0.1)
Basophils Relative: 1 %
Eosinophils Absolute: 0.1 K/uL (ref 0.0–0.5)
Eosinophils Relative: 1 %
HCT: 38.1 % — ABNORMAL LOW (ref 39.0–52.0)
Hemoglobin: 11.1 g/dL — ABNORMAL LOW (ref 13.0–17.0)
Immature Granulocytes: 1 %
Lymphocytes Relative: 12 %
Lymphs Abs: 1.3 K/uL (ref 0.7–4.0)
MCH: 27.8 pg (ref 26.0–34.0)
MCHC: 29.1 g/dL — ABNORMAL LOW (ref 30.0–36.0)
MCV: 95.3 fL (ref 80.0–100.0)
Monocytes Absolute: 0.9 K/uL (ref 0.1–1.0)
Monocytes Relative: 8 %
Neutro Abs: 8.6 K/uL — ABNORMAL HIGH (ref 1.7–7.7)
Neutrophils Relative %: 77 %
Platelets: 364 K/uL (ref 150–400)
RBC: 4 MIL/uL — ABNORMAL LOW (ref 4.22–5.81)
RDW: 15.6 % — ABNORMAL HIGH (ref 11.5–15.5)
WBC: 11.1 K/uL — ABNORMAL HIGH (ref 4.0–10.5)
nRBC: 0 % (ref 0.0–0.2)

## 2024-02-11 LAB — BASIC METABOLIC PANEL WITH GFR
Anion gap: 10 (ref 5–15)
BUN: 23 mg/dL (ref 8–23)
CO2: 42 mmol/L — ABNORMAL HIGH (ref 22–32)
Calcium: 9 mg/dL (ref 8.9–10.3)
Chloride: 88 mmol/L — ABNORMAL LOW (ref 98–111)
Creatinine, Ser: 1.27 mg/dL — ABNORMAL HIGH (ref 0.61–1.24)
GFR, Estimated: 56 mL/min — ABNORMAL LOW (ref >60)
Glucose, Bld: 187 mg/dL — ABNORMAL HIGH (ref 70–99)
Potassium: 5.1 mmol/L (ref 3.5–5.1)
Sodium: 140 mmol/L (ref 135–145)

## 2024-02-11 LAB — RESP PANEL BY RT-PCR (RSV, FLU A&B, COVID)  RVPGX2
Influenza A by PCR: NEGATIVE
Influenza B by PCR: NEGATIVE
Resp Syncytial Virus by PCR: NEGATIVE
SARS Coronavirus 2 by RT PCR: NEGATIVE

## 2024-02-11 LAB — I-STAT VENOUS BLOOD GAS, ED
Acid-Base Excess: 16 mmol/L — ABNORMAL HIGH (ref 0.0–2.0)
Bicarbonate: 47.9 mmol/L — ABNORMAL HIGH (ref 20.0–28.0)
Calcium, Ion: 1.14 mmol/L — ABNORMAL LOW (ref 1.15–1.40)
HCT: 36 % — ABNORMAL LOW (ref 39.0–52.0)
Hemoglobin: 12.2 g/dL — ABNORMAL LOW (ref 13.0–17.0)
O2 Saturation: 68 %
Potassium: 5.1 mmol/L (ref 3.5–5.1)
Sodium: 137 mmol/L (ref 135–145)
TCO2: >50 mmol/L — ABNORMAL HIGH (ref 22–32)
pCO2, Ven: 109.9 mmHg (ref 44–60)
pH, Ven: 7.248 — ABNORMAL LOW (ref 7.25–7.43)
pO2, Ven: 45 mmHg (ref 32–45)

## 2024-02-11 LAB — I-STAT CG4 LACTIC ACID, ED
Lactic Acid, Venous: 1.9 mmol/L (ref 0.5–1.9)
Lactic Acid, Venous: 0.7 mmol/L (ref 0.5–1.9)

## 2024-02-11 LAB — CK: Total CK: 47 U/L — ABNORMAL LOW (ref 49–397)

## 2024-02-11 LAB — TROPONIN I (HIGH SENSITIVITY)
Troponin I (High Sensitivity): 18 ng/L — ABNORMAL HIGH (ref <18)
Troponin I (High Sensitivity): 17 ng/L (ref <18)

## 2024-02-11 LAB — BRAIN NATRIURETIC PEPTIDE: B Natriuretic Peptide: 187.3 pg/mL — ABNORMAL HIGH (ref 0.0–100.0)

## 2024-02-11 LAB — PROTIME-INR
INR: 1.1 (ref 0.8–1.2)
Prothrombin Time: 15 s (ref 11.4–15.2)

## 2024-02-11 LAB — CBG MONITORING, ED: Glucose-Capillary: 185 mg/dL — ABNORMAL HIGH (ref 70–99)

## 2024-02-11 LAB — MAGNESIUM: Magnesium: 2 mg/dL (ref 1.7–2.4)

## 2024-02-11 MED ORDER — SODIUM CHLORIDE 0.9 % IV BOLUS
500.0000 mL | Freq: Once | INTRAVENOUS | Status: AC
Start: 2024-02-11 — End: 2024-02-12
  Administered 2024-02-11: 500 mL via INTRAVENOUS

## 2024-02-11 MED ORDER — ALBUTEROL SULFATE (2.5 MG/3ML) 0.083% IN NEBU: 2.5000 mg | INHALATION_SOLUTION | RESPIRATORY_TRACT | Status: DC | PRN

## 2024-02-11 MED ORDER — PANTOPRAZOLE SODIUM 40 MG PO TBEC
40.0000 mg | DELAYED_RELEASE_TABLET | Freq: Every morning | ORAL | Status: DC
  Administered 2024-02-12 – 2024-02-13 (×2): 40 mg via ORAL
  Filled 2024-02-11 (×2): qty 1

## 2024-02-11 MED ORDER — SODIUM CHLORIDE 0.9 % IV SOLN: 500.0000 mg | INTRAVENOUS | Status: DC

## 2024-02-11 MED ORDER — HEPARIN SODIUM (PORCINE) 5000 UNIT/ML IJ SOLN: 5000.0000 [IU] | Freq: Two times a day (BID) | INTRAMUSCULAR | Status: DC

## 2024-02-11 MED ORDER — SODIUM CHLORIDE 0.9 % IV SOLN
100.0000 mg | Freq: Two times a day (BID) | INTRAVENOUS | Status: DC
  Administered 2024-02-11 – 2024-02-12 (×2): 100 mg via INTRAVENOUS
  Filled 2024-02-11 (×2): qty 100

## 2024-02-11 MED ORDER — BISACODYL 5 MG PO TBEC: 5.0000 mg | DELAYED_RELEASE_TABLET | Freq: Every day | ORAL | Status: DC | PRN

## 2024-02-11 MED ORDER — APIXABAN 5 MG PO TABS: 5.0000 mg | ORAL_TABLET | Freq: Two times a day (BID) | ORAL | Status: DC

## 2024-02-11 MED ORDER — LACTATED RINGERS IV SOLN: INTRAVENOUS | Status: AC

## 2024-02-11 MED ORDER — SODIUM CHLORIDE 0.9 % IV SOLN: 2.0000 g | INTRAVENOUS | Status: DC

## 2024-02-11 MED ORDER — ONDANSETRON HCL 4 MG/2ML IJ SOLN: 4.0000 mg | Freq: Four times a day (QID) | INTRAMUSCULAR | Status: DC | PRN

## 2024-02-11 MED ORDER — DOFETILIDE 250 MCG PO CAPS
250.0000 ug | ORAL_CAPSULE | Freq: Two times a day (BID) | ORAL | Status: DC
  Administered 2024-02-12 – 2024-02-13 (×3): 250 ug via ORAL
  Filled 2024-02-11 (×6): qty 1

## 2024-02-11 MED ORDER — SODIUM CHLORIDE 0.9 % IV SOLN
2.0000 g | Freq: Two times a day (BID) | INTRAVENOUS | Status: DC
  Administered 2024-02-11 – 2024-02-12 (×2): 2 g via INTRAVENOUS
  Filled 2024-02-11 (×2): qty 12.5

## 2024-02-11 MED ORDER — IOHEXOL 350 MG/ML SOLN
75.0000 mL | Freq: Once | INTRAVENOUS | Status: AC | PRN
  Administered 2024-02-11: 75 mL via INTRAVENOUS

## 2024-02-11 MED ORDER — DEXTROSE-SODIUM CHLORIDE 5-0.9 % IV SOLN: INTRAVENOUS | Status: DC

## 2024-02-11 MED ORDER — ACETAMINOPHEN 650 MG RE SUPP: 650.0000 mg | Freq: Four times a day (QID) | RECTAL | Status: DC | PRN

## 2024-02-11 MED ORDER — ONDANSETRON HCL 4 MG PO TABS: 4.0000 mg | ORAL_TABLET | Freq: Four times a day (QID) | ORAL | Status: DC | PRN

## 2024-02-11 MED ORDER — IPRATROPIUM-ALBUTEROL 0.5-2.5 (3) MG/3ML IN SOLN
3.0000 mL | Freq: Four times a day (QID) | RESPIRATORY_TRACT | Status: DC
  Administered 2024-02-11 – 2024-02-13 (×7): 3 mL via RESPIRATORY_TRACT
  Filled 2024-02-11 (×7): qty 3

## 2024-02-11 MED ORDER — ROSUVASTATIN CALCIUM 20 MG PO TABS
20.0000 mg | ORAL_TABLET | Freq: Every day | ORAL | Status: DC
  Administered 2024-02-12 – 2024-02-13 (×2): 20 mg via ORAL
  Filled 2024-02-11 (×3): qty 1

## 2024-02-11 MED ORDER — DILTIAZEM HCL ER COATED BEADS 120 MG PO CP24
120.0000 mg | ORAL_CAPSULE | Freq: Every day | ORAL | Status: DC
  Administered 2024-02-13: 120 mg via ORAL
  Filled 2024-02-11 (×3): qty 1

## 2024-02-11 MED ORDER — SODIUM CHLORIDE 0.9% FLUSH
3.0000 mL | Freq: Two times a day (BID) | INTRAVENOUS | Status: DC
  Administered 2024-02-11: 3 mL via INTRAVENOUS

## 2024-02-11 MED ORDER — IPRATROPIUM-ALBUTEROL 0.5-2.5 (3) MG/3ML IN SOLN: 3.0000 mL | RESPIRATORY_TRACT | Status: DC

## 2024-02-11 MED ORDER — MORPHINE SULFATE (CONCENTRATE) 10 MG /0.5 ML PO SOLN: 5.0000 mg | ORAL | Status: DC | PRN

## 2024-02-11 MED ORDER — GABAPENTIN 100 MG PO CAPS
200.0000 mg | ORAL_CAPSULE | Freq: Every day | ORAL | Status: DC
  Administered 2024-02-11 – 2024-02-12 (×2): 200 mg via ORAL
  Filled 2024-02-11 (×2): qty 2

## 2024-02-11 MED ORDER — FINASTERIDE 5 MG PO TABS
5.0000 mg | ORAL_TABLET | Freq: Every morning | ORAL | Status: DC
  Administered 2024-02-12 – 2024-02-13 (×2): 5 mg via ORAL
  Filled 2024-02-11 (×2): qty 1

## 2024-02-11 MED ORDER — BUDESONIDE 0.25 MG/2ML IN SUSP
0.2500 mg | Freq: Two times a day (BID) | RESPIRATORY_TRACT | Status: DC
  Administered 2024-02-11 – 2024-02-13 (×4): 0.25 mg via RESPIRATORY_TRACT
  Filled 2024-02-11 (×4): qty 2

## 2024-02-11 MED ORDER — ACETAMINOPHEN 325 MG PO TABS: 650.0000 mg | ORAL_TABLET | Freq: Four times a day (QID) | ORAL | Status: DC | PRN

## 2024-02-11 NOTE — ED Triage Notes (Signed)
 Pt arrives via POV. Wife reports she was taking the patient to the dentist and he became unresponsive. Security, NT, and this RN lifted the patient out of the vehicle, placed him on a stretcher and took him straight back to a room. Dr. Dean at bedside. Initial spo2 was 45% on his portable oxygen. NRB placed, spo2 improved. Pt began speaking at that time. Spouse states patient has had increased o2 requirements and has been very fatigued over the past few days.   When patient was emergently lifted out of the car to be placed on a stretcher, he obtained an skin tear to his right forearm. Dressing applied.

## 2024-02-11 NOTE — Progress Notes (Addendum)
 Based on ct chest started cefepime  and azithromycin.

## 2024-02-11 NOTE — Progress Notes (Signed)
   02/11/24 1433  Spiritual Encounters  Type of Visit Initial  Care provided to: Pt and family  Reason for visit Routine spiritual support  OnCall Visit No  Spiritual Framework  Presenting Themes Rituals and practive  Patient Stress Factors Health changes  Family Stress Factors Exhausted;Health changes  Interventions  Spiritual Care Interventions Made Prayer;Established relationship of care and support;Reconciliation with self/others  Intervention Outcomes  Outcomes Awareness of support;Awareness of health;Reduced anxiety   Chaplain was asked to  respond to the ED for patient arrival. Pt's spouse was waiting in the ED conference room. Chaplain accompanied and support the spouse to the Pt's bedside. Spouse shared that they had been married for 61 years and request prayer.  Chaplain offered a compassionate presence and provided prayer at bedside. Spiritual and emotional support were provided during this time. Chaplain Services remains available for continued care as needed.

## 2024-02-11 NOTE — Progress Notes (Signed)
 [facility] ED [facility] liaison note     This patient is a current hospice patient with [facility]. Please see media tab for detailed hospice report.    Liaison will continue to follow for any discharge planning needs and to coordinate continuation of hospice care.    Please don't hesitate to call with any Hospice related questions or concerns.    Thank you for the opportunity to participate in this patient's care.  Madelene Schanz, BSN, RN, OCN [employer] [phone]

## 2024-02-11 NOTE — ED Notes (Addendum)
 [name] [name]

## 2024-02-11 NOTE — ED Notes (Signed)
 CCMD called.

## 2024-02-11 NOTE — Progress Notes (Addendum)
 Received message from Dr.Stutzman for CT imaging:  Head CT: Small subdural hematoma 4 mm on 3 mm on right. No mass effect or midline shift.  Informed wife at bedside of same. Pt is sleeping.  Will consult NS . Chest ct is pending.  Discussed case with Lauraine Pickle provider with neurosurgery who recommended Rescan in 8 hours- ct noncontrast.   To call back for any changes or worsening and we we will hold Eliquis  and any blood thinners.

## 2024-02-11 NOTE — H&P (Addendum)
 History and Physical    Patient: [name] [name]. FMW:[id number] DOB: [birth year] DOA: 02/11/2024 DOS: the patient was seen and examined on 02/11/2024 . PCP: Pcp, No  Patient coming from: Home Chief complaint: Chief Complaint  Patient presents with   Unresponsive   HPI:  [name] [name]. is a 83 y.o. male with past medical history  of  COPD Gold stage III, PAF on Tikosyn  and Eliquis , chronic hypoxic respiratory failure on 3 to 4 L as needed, HTN, chronic HFpEF, CAD status post stenting in 2019, brought from dentist office for AMS that he developed at the dentist office and would not wake up. Pt is DNR/ DNI and wants to be completely treated until that point.  Patient was moved out of the vehicle placed on a stretcher and initial O2 sat was noted to be at 45% and a nonrebreather was placed.  Wife reports increased O2 demand over the past few days, prior to this patient was supposed to be on 2 to 4 L at home, patient is established with hospice of our care.   ED Course:  Vital signs in the ED were notable for the following:  Vitals:   02/11/24 1403 02/11/24 1408 02/11/24 1415 02/11/24 1459  BP:   (!) 119/46   Pulse: 85 81 80   Temp:    (!) 96.5 F (35.8 C)  Resp: (!) 21 15 (!) 24   SpO2: 100% 100% 100%   TempSrc:    Axillary  >>ED evaluation thus far shows: Venous blood gas showing PCO2109.9 pH of 7.2 pO2 45. Basic metabolic panel showing glucose of 187, acute kidney injury with a creatinine of 1.27 EGFR of 56. Troponin of 18. Lactic acid of 1.9. CBC showing white count of 11.1 hemoglobin of 11.1 platelets of 364. Blood cultures obtained in the emergency room. Chest x-ray done today shows hyperinflation with chronic changes. EKG today sinus rhythm 89 with PACs PR interval of 187 right axis deviation QRS of 93 and prolonged QT at 538.   >>While in the ED patient received the following: Medications  sodium chloride  0.9 % bolus 500 mL (has no administration in time range)    Review of Systems  Musculoskeletal:        Thigh pain.        Past Medical History:  Diagnosis Date   Abdominal discomfort 12/07/2020   Acquired trigger finger of right middle finger 02/04/2020   Acute prostatitis 04/08/2018   Alcohol abuse     Alcohol dependence (HCC) 12/07/2020   Anal or rectal pain 04/08/2018   Annual physical exam 12/07/2020   Anorectal disorder 12/07/2020   Arthritis     Atherosclerotic heart disease of native coronary artery without angina pectoris 04/08/2018    a. 03/2018 PCI: LM nl, LAD 60m, LCX nl, OM1 70p (2.75x8 Synergy DES), 44m (2.5x24 Synergy DES), RCA 25p, RPL1 50. EF 55-65%.   Atrial flutter (HCC)      a. 07/2022 in setting of hospitalization for resp illness/RSV. CHA2DS2VASc = 4-->eliquis ; b. 08/2022 s/p DCCV (100J); c. 09/26/2022 Recurrent Aflutter.   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 02/2022 Carotid U/S: 1-39% bilat ICA stenoses.   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   Contracture of palmar fascia 12/07/2020   COPD (chronic obstructive pulmonary disease) (HCC)     Corn of toe 12/07/2020   Corns and callosities 04/09/2018   Coronary artery disease      2019 with stents   Coronary atherosclerosis 05/07/2018   Degenerative disc disease, 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   Degenerative disc disease, 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   Diarrhea 04/09/2018   Double vision with both eyes open 12/07/2020   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   Dysphagia 08/14/2018   Dyspnea     ED (erectile  dysfunction)     ED (erectile dysfunction) of organic origin 12/07/2020   Elevated prostate specific antigen (PSA) 04/08/2018   Elevated PSA     Encounter for screening for other disorder 12/07/2020   Enlarged prostate without lower urinary tract symptoms (luts) 04/09/2018   Enthesopathy 12/07/2020   Essential (primary) hypertension 04/08/2018   Ex-smoker 04/09/2018   Exertional dyspnea 04/02/2018   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   Flatulence 04/08/2018   Gastro-esophageal reflux disease without esophagitis 04/08/2018   GERD (gastroesophageal reflux disease)     History of acute otitis externa 08/14/2018   History of echocardiogram      a. 07/2022 Echo: EF 65-70%, no rwma, mild LVH, nl RV fxn.   Hyperlipidemia     Hypertension     Hypoxemia 12/07/2020   Kidney cysts     Left knee pain 12/07/2020   Low back pain 04/08/2018   Male erectile dysfunction 04/09/2018   Mixed hyperlipidemia 04/08/2018   Nocturia more than twice per night 04/09/2018   Osteoarthritis of first carpometacarpal joint 04/08/2018   Osteoarthritis of knee 12/07/2020   Other long term (current) drug therapy 12/07/2020   Pain due to onychomycosis of toenail of left foot 07/20/2019   Pain in joint 04/09/2018   Pain in knee 04/09/2018   Pain in unspecified knee 12/07/2020   Pain of finger 04/09/2018   Palpitations     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HCC) 12/07/2020   Personal history of colonic polyps 12/07/2020   Pneumonia     Pneumothorax 04/12/2020   Polypharmacy 04/09/2018   Porokeratosis 01/13/2019   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HCC)     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   Pulmonary nodules 06/08/2016   Pure hypercholesterolemia 12/07/2020   Renal cyst 12/07/2020   Sleep disorder 04/08/2018   Status post bronchoscopy 04/12/2020   Tear of rotator cuff 04/09/2018   Tobacco user 12/07/2020   Uncomplicated alcohol dependence (HCC) 04/09/2018   Unspecified rotator cuff tear or rupture of  unspecified shoulder, not specified as traumatic 12/07/2020   Unspecified tear of unspecified meniscus, current injury, unspecified knee, initial encounter 12/07/2020   Visual disturbance 12/07/2020   Vitamin D deficiency 04/08/2018               Past Surgical History:  Procedure Laterality Date   BRONCHIAL BIOPSY   10/13/2019    Procedure: BRONCHIAL BIOPSIES;  Surgeon: Shelah Lamar RAMAN, MD;  Location: [facility] ENDOSCOPY;  Service: Pulmonary;;   BRONCHIAL BIOPSY   04/12/2020    Procedure: BRONCHIAL BIOPSIES;  Surgeon: Shelah Lamar RAMAN, MD;  Location: [facility] ENDOSCOPY;  Service: Pulmonary;;   BRONCHIAL BRUSHINGS   10/13/2019    Procedure: BRONCHIAL BRUSHINGS;  Surgeon: Shelah Lamar RAMAN, MD;  Location: [facility] ENDOSCOPY;  Service: Pulmonary;;   BRONCHIAL  BRUSHINGS   04/12/2020    Procedure: BRONCHIAL BRUSHINGS;  Surgeon: Shelah Lamar RAMAN, MD;  Location: [facility] ENDOSCOPY;  Service: Pulmonary;;   BRONCHIAL NEEDLE ASPIRATION BIOPSY   10/13/2019    Procedure: BRONCHIAL NEEDLE ASPIRATION BIOPSIES;  Surgeon: Shelah Lamar RAMAN, MD;  Location: [facility] ENDOSCOPY;  Service: Pulmonary;;   BRONCHIAL NEEDLE ASPIRATION BIOPSY   04/12/2020    Procedure: BRONCHIAL NEEDLE ASPIRATION BIOPSIES;  Surgeon: Shelah Lamar RAMAN, MD;  Location: [facility] ENDOSCOPY;  Service: Pulmonary;;   BRONCHIAL WASHINGS   10/13/2019    Procedure: BRONCHIAL WASHINGS;  Surgeon: Shelah Lamar RAMAN, MD;  Location: [facility] ENDOSCOPY;  Service: Pulmonary;;   BRONCHIAL WASHINGS   04/12/2020    Procedure: BRONCHIAL WASHINGS;  Surgeon: Shelah Lamar RAMAN, MD;  Location: [facility] ENDOSCOPY;  Service: Pulmonary;;   CARDIAC CATHETERIZATION   2002    50% RCA   CARDIOVERSION N/A 09/17/2022    Procedure: CARDIOVERSION;  Surgeon: Darron Deatrice LABOR, MD;  Location: [facility] ORS;  Service: Cardiovascular;  Laterality: N/A;   CATARACT EXTRACTION, BILATERAL       CORONARY STENT INTERVENTION N/A 04/15/2018    Procedure: CORONARY STENT INTERVENTION;  Surgeon: Dann Candyce RAMAN, MD;  Location: [facility] INVASIVE CV LAB;   Service: Cardiovascular;  Laterality: N/A;  om1   EYE SURGERY       KNEE ARTHROSCOPY       LEFT HEART CATH AND CORONARY ANGIOGRAPHY N/A 04/15/2018    Procedure: LEFT HEART CATH AND CORONARY ANGIOGRAPHY;  Surgeon: Dann Candyce RAMAN, MD;  Location: [facility] INVASIVE CV LAB;  Service: Cardiovascular;  Laterality: N/A;   MULTIPLE TOOTH EXTRACTIONS       TONSILLECTOMY       VIDEO BRONCHOSCOPY   04/12/2020   VIDEO BRONCHOSCOPY WITH ENDOBRONCHIAL NAVIGATION N/A 07/26/2016    Procedure: VIDEO BRONCHOSCOPY WITH ENDOBRONCHIAL NAVIGATION;  Surgeon: Lamar RAMAN Shelah, MD;  Location: [facility] OR;  Service: Thoracic;  Laterality: N/A;   VIDEO BRONCHOSCOPY WITH ENDOBRONCHIAL NAVIGATION N/A 10/13/2019    Procedure: [facility] AND VIDEO BRONCHOSCOPY WITH ENDOBRONCHIAL NAVIGATION;  Surgeon: Shelah Lamar RAMAN, MD;  Location: [facility] ENDOSCOPY;  Service: Pulmonary;  Laterality: N/A;   VIDEO BRONCHOSCOPY WITH ENDOBRONCHIAL NAVIGATION N/A 04/12/2020    Procedure: VIDEO BRONCHOSCOPY WITH ENDOBRONCHIAL NAVIGATION;  Surgeon: Shelah Lamar RAMAN, MD;  Location: [facility] ENDOSCOPY;  Service: Pulmonary;  Laterality: N/A;           reports that he quit smoking about 3 years ago. His smoking use included cigarettes. He started smoking about 71 years ago. He has a 51 pack-year smoking history. He has never been exposed to tobacco smoke. He has never used smokeless tobacco. He reports that he does not drink alcohol and does not use drugs.   Allergies       Allergies  Allergen Reactions   Flagyl [Metronidazole] Other (See Comments)      Other reaction(s): skin reaction             Family History  Problem Relation Age of Onset   Hypertension Mother     Alzheimer's disease Mother                   Prior to Admission medications   Medication Sig Start Date End Date Taking? Authorizing Provider  acetaminophen  (TYLENOL ) 325 MG tablet Take 650 mg by mouth every 4 (four) hours as needed.       [provider]  albuterol  (VENTOLIN  HFA) 108  (90 Base) MCG/ACT inhaler INHALE 2 PUFFS 2-3 TIMES A DAY AS  NEEDED FOR WHEEZING (30 DAYS) 30 DAY(S) 07/01/23     Jimmy Charlie FERNS, MD  apixaban  (ELIQUIS ) 5 MG TABS tablet Take 1 tablet (5 mg total) by mouth 2 (two) times daily. 08/01/23     Jimmy Charlie FERNS, MD  Budeson-Glycopyrrol-Formoterol (BREZTRI AEROSPHERE) 160-9-4.8 MCG/ACT AERO Inhale 2 puffs into the lungs 2 (two) times daily. 08/01/23     Jimmy Charlie FERNS, MD  clonazePAM  (KLONOPIN ) 0.5 MG tablet TAKE 1 TABLET BY MOUTH EVERYDAY AT BEDTIME 10/02/23     Jimmy Charlie I, MD  diltiazem  (CARDIZEM  CD) 120 MG 24 hr capsule TAKE 1 CAPSULE BY MOUTH 2 TIMES DAILY. 09/03/23     Furth, Cadence H, PA-C  dofetilide  (TIKOSYN ) 250 MCG capsule Take 1 capsule (250 mcg total) by mouth 2 (two) times daily. 03/21/23     Riddle, Suzann, NP  finasteride  (PROSCAR ) 5 MG tablet Take 5 mg by mouth daily.       [provider]  furosemide (LASIX) 20 MG tablet Give Alternating Dose of 20mg /40mg  by mouth once daily.       [provider]  gabapentin  (NEURONTIN ) 100 MG capsule Take 200 mg by mouth at bedtime. 07/26/21     [provider]  ipratropium-albuterol  (DUONEB) 0.5-2.5 (3) MG/3ML SOLN INHALE 3 ML BY NEBULIZER EVERY 6 HOURS AS NEEDED 11/29/22     Shelah Lamar RAMAN, MD  morphine  (ROXANOL) 20 MG/ML concentrated solution Take 0.25 mLs (5 mg total) by mouth daily as needed. 09/02/23     Abdul Fine, MD  nitroGLYCERIN (NITROSTAT) 0.4 MG SL tablet Place 1 tablet (0.4 mg total) under the tongue every 5 (five) minutes as needed for chest pain. 07/13/21     Revankar, Jennifer SAUNDERS, MD  OXYGEN Inhale into the lungs. 3-4 liters upon exertion and 3 liters continuous at night.       [provider]  pantoprazole  (PROTONIX ) 40 MG tablet Take 1 tablet (40 mg total) by mouth daily. 03/13/23     Letvak, Richard I, MD  polyethylene glycol (MIRALAX / GLYCOLAX) 17 g packet Take 17 g by mouth daily as needed for moderate constipation.       [provider]   rosuvastatin  (CRESTOR ) 20 MG tablet Take 1 tablet (20 mg total) by mouth daily. 04/16/23     Riddle, Suzann, NP  senna (SENOKOT) 8.6 MG TABS tablet Take 2 tablets by mouth at bedtime.       [provider]  silver sulfADIAZINE (SILVADENE) 1 % cream Apply 1 Application topically daily. 09/20/22     Letvak, Richard I, MD  sodium chloride  HYPERTONIC 3 % nebulizer solution Take by nebulization in the morning and at bedtime. 06/26/23 06/25/24   Isadora Hose, MD                                                                                   Vitals:   02/11/24 1403 02/11/24 1408 02/11/24 1415 02/11/24 1459  BP:   (!) 119/46   Pulse: 85 81 80   Resp: (!) 21 15 (!) 24   Temp:    (!) 96.5 F (35.8 C)  TempSrc:    Axillary  SpO2: 100%  100% 100%    Physical Exam Vitals reviewed.  Constitutional:      General: He is not in acute distress.    Appearance: He is ill-appearing.     Interventions: Face mask in place.  HENT:     Head: Normocephalic.  Eyes:     Extraocular Movements: Extraocular movements intact.  Cardiovascular:     Rate and Rhythm: Normal rate and regular rhythm.     Pulses: Normal pulses.     Heart sounds: Normal heart sounds.  Pulmonary:     Breath sounds: Wheezing present.  Abdominal:     General: There is no distension.     Palpations: Abdomen is soft.     Tenderness: There is no abdominal tenderness.  Musculoskeletal:     Right lower leg: Edema present.     Left lower leg: Edema present.  Neurological:     General: No focal deficit present.     Mental Status: He is alert and oriented to person, place, and time.     Labs on Admission: I have personally reviewed following labs and imaging studies CBC: Recent Labs  Lab 02/11/24 1404 02/11/24 1409  WBC 11.1*  --   NEUTROABS 8.6*  --   HGB 11.1* 12.2*  HCT 38.1* 36.0*  MCV 95.3  --   PLT 364  --    Basic Metabolic Panel: Recent Labs  Lab 02/11/24 1404 02/11/24 1409  NA 140 137  K 5.1 5.1   CL 88*  --   CO2 42*  --   GLUCOSE 187*  --   BUN 23  --   CREATININE 1.27*  --   CALCIUM  9.0  --    GFR: CrCl cannot be calculated (Unknown ideal weight.). Liver Function Tests: No results for input(s): AST, ALT, ALKPHOS, BILITOT, PROT, ALBUMIN in the last 168 hours. No results for input(s): LIPASE, AMYLASE in the last 168 hours. No results for input(s): AMMONIA in the last 168 hours. Coagulation Profile: No results for input(s): INR, PROTIME in the last 168 hours. Cardiac Enzymes: No results for input(s): CKTOTAL, CKMB, CKMBINDEX, TROPONINI in the last 168 hours. BNP (last 3 results) No results for input(s): PROBNP in the last 8760 hours. HbA1C: No results for input(s): HGBA1C in the last 72 hours. CBG: Recent Labs  Lab 02/11/24 1351  GLUCAP 185*   Lipid Profile: No results for input(s): CHOL, HDL, LDLCALC, TRIG, CHOLHDL, LDLDIRECT in the last 72 hours. Thyroid Function Tests: No results for input(s): TSH, T4TOTAL, FREET4, T3FREE, THYROIDAB in the last 72 hours. Anemia Panel: No results for input(s): VITAMINB12, FOLATE, FERRITIN, TIBC, IRON, RETICCTPCT in the last 72 hours. Urine analysis: No results found for: COLORURINE, APPEARANCEUR, LABSPEC, PHURINE, GLUCOSEU, HGBUR, BILIRUBINUR, KETONESUR, PROTEINUR, UROBILINOGEN, NITRITE, LEUKOCYTESUR Radiological Exams on Admission: DG Chest Port 1 View Result Date: 02/11/2024 CLINICAL DATA:  Shortness of breath EXAM: PORTABLE CHEST 1 VIEW COMPARISON:  X-ray 10/21/2023 and older FINDINGS: Hyperinflation. Chronic lung changes. No consolidation, pneumothorax or effusion. No edema. Normal cardiopericardial silhouette. Degenerative changes along the spine. Overlapping cardiac leads. IMPRESSION: Hyperinflation with chronic changes. Electronically Signed   By: Ranell Bring M.D.   On: 02/11/2024 14:43   Data Reviewed: Relevant notes from primary  care and specialist visits, past discharge summaries as available in EHR, including Care Everywhere . Prior diagnostic testing as pertinent to current admission diagnoses, Updated medications and problem lists for reconciliation .ED course, including vitals, labs, imaging, treatment and response to treatment,Triage notes, nursing and pharmacy notes and  ED provider's notes.Notable results as noted in HPI.Discussed case with EDMD/ ED APP/ or Specialty MD on call and as needed.  Assessment & Plan  >> AMS/  metabolic encephalopathy : 2/2 Acute on chronic hypoxic hypercapnic respiratory failure: -CTA chest pending.  -Continue BiPAP support -Pulmicort  nebulizer.  -ICS and LABA - Prn albuterol .   >>AKI: -renally dose and avoid contrast. Urinlaysis pendingd  Lab Results  Component Value Date   CREATININE 1.27 (H) 02/11/2024  Cont proscar .   >>Anemia: Mild with hb of 11.1. We will follow, no report of bleeding or LOC.     >>Chronic HFpEF decompensation: -Continue IV diuresis if as BP allows. -Recheck chest x-ray as needed.  -Echocardiogram was done recently, chart merge pending.   >>PAF : -EKG showed SR . -Continue Cardizem  at half dose  and Tikosyn  -Continue Eliquis    >>Home hospice: Followed by [facility].   >>Thigh pain: We will Venous doppler. Pt is on eliquis  and is less likely that he will have DVT . CPK ordered and pending.     DVT prophylaxis:  Eliquis   Consults:  None   Advance Care Planning:    Code Status: Limited: Do not attempt resuscitation (DNR) -DNR-LIMITED -Do Not Intubate/DNI    Family Communication:  Wife  Disposition Plan:  Home.  Severity of Illness: The appropriate patient status for this patient is OBSERVATION. Observation status is judged to be reasonable and necessary in order to provide the required intensity of service to ensure the patient's safety. The patient's presenting symptoms, physical exam findings, and initial radiographic and  laboratory data in the context of their medical condition is felt to place them at decreased risk for further clinical deterioration. Furthermore, it is anticipated that the patient will be medically stable for discharge from the hospital within 2 midnights of admission.   Unresulted Labs (From admission, onward)     Start     Ordered   02/12/24 0500  Comprehensive metabolic panel  Tomorrow morning,   R        02/11/24 1708   02/12/24 0500  CBC  Tomorrow morning,   R        02/11/24 1708   02/11/24 1722  CK  Add-on,   AD        02/11/24 1721   02/11/24 1720  Urinalysis, Routine w reflex microscopic -Urine, Random  Add-on,   AD       Question:  Specimen Source  Answer:  Urine, Random   02/11/24 1719   02/11/24 1635  Magnesium  Add-on,   AD        02/11/24 1634   02/11/24 1403  Urinalysis, Routine w reflex microscopic -Urine, Clean Catch  Once,   URGENT       Question:  Specimen Source  Answer:  Urine, Clean Catch   02/11/24 1402   02/11/24 1402  Culture, blood (routine x 2)  BLOOD CULTURE X 2,   R      02/11/24 1402            Meds ordered this encounter  Medications   sodium chloride  0.9 % bolus 500 mL   sodium chloride  flush (NS) 0.9 % injection 3 mL   lactated ringers  infusion   OR Linked Order Group    acetaminophen  (TYLENOL ) tablet 650 mg    acetaminophen  (TYLENOL ) suppository 650 mg   bisacodyl  (DULCOLAX) EC tablet 5 mg   OR Linked Order Group    ondansetron  (ZOFRAN ) tablet 4 mg  ondansetron  (ZOFRAN ) injection 4 mg   ipratropium-albuterol  (DUONEB) 0.5-2.5 (3) MG/3ML nebulizer solution 3 mL   albuterol  (PROVENTIL ) (2.5 MG/3ML) 0.083% nebulizer solution 2.5 mg   DISCONTD: heparin  injection 5,000 Units   budesonide  (PULMICORT ) nebulizer solution 0.25 mg   dofetilide  (TIKOSYN ) capsule 250 mcg   apixaban  (ELIQUIS ) tablet 5 mg   finasteride  (PROSCAR ) tablet 5 mg   gabapentin  (NEURONTIN ) capsule 200 mg   DISCONTD: ipratropium-albuterol  (DUONEB) 0.5-2.5 (3) MG/3ML  nebulizer solution 3 mL    Inhale 3mL by nebulization twice daily. May use extra if needed.     morphine  CONCENTRATE 10 mg / 0.5 ml oral solution 5 mg    Refill:  0   pantoprazole  (PROTONIX ) EC tablet 40 mg   rosuvastatin  (CRESTOR ) tablet 20 mg   diltiazem  (CARDIZEM  CD) 24 hr capsule 120 mg     Orders Placed This Encounter  Procedures   Resp panel by RT-PCR (RSV, Flu A&B, Covid) Anterior Nasal Swab   Culture, blood (routine x 2)   DG Chest Port 1 View   CT Head Wo Contrast   CT Angio Chest PE W and/or Wo Contrast   Basic metabolic panel   CBC with Differential   Brain natriuretic peptide   Urinalysis, Routine w reflex microscopic -Urine, Clean Catch   Magnesium   Comprehensive metabolic panel   CBC   Urinalysis, Routine w reflex microscopic -Urine, Random   CK   Diet NPO time specified   ED Cardiac monitoring   Refer to Sidebar Report:   Apply Chronic Obstructive Pulmonary Disease Care Plan   Provide Smoking / Tobacco cessation education.  Provide education material and information on community counseling.   Cardiac Monitoring Continuous x 24 hours Indications for use: Other; other indications for use: COPD exacerbation   Maintain IV access   Vital signs   Notify physician (specify)   Mobility Protocol: No Restrictions RN to initiate protocols based on patient's level of care   Refer to Sidebar Report Refer to ICU, Med-Surg, Progressive, and Step-Down Mobility Protocol Sidebars   Initiate Adult Central Line Maintenance and Catheter Protocol for patients with central line (CVC, PICC, Port, Hemodialysis, Trialysis)   If patient diabetic or glucose greater than 140 notify physician for Sliding Scale Insulin Orders   Intake and Output   Do not place and if present remove PureWick   Initiate Oral Care Protocol   Initiate Carrier Fluid Protocol   Nurse to provide smoking / tobacco cessation education   RN may order General Admission PRN Orders utilizing General Admission PRN  medications (through manage orders) for the following patient needs: allergy symptoms (Claritin), cold sores (Carmex), cough (Robitussin DM), eye irritation (Liquifilm Tears), hemorrhoids (Tucks), indigestion (Maalox), minor skin irritation (Hydrocortisone Cream), muscle pain Lucienne Gay), nose irritation (saline nasal spray) and sore throat (Chloraseptic spray).   Do not attempt resuscitation (DNR)- Limited -Do Not Intubate (DNI)   Consult to hospitalist   Nutritional services consult   Consult to Transition of Care Team   Consult to respiratory care treatment   Pharmacy consult   OT eval and treat   PT eval and treat   ED Pulse oximetry, continuous   Bipap   Pulse oximetry check with vital signs   Oxygen therapy Mode or (Route): Nasal cannula; Liters Per Minute: 2; Keep O2 saturation between: greater than 92 %   CBG monitoring, ED   POC CBG, ED   I-Stat venous blood gas, ([facility] ED, [facility], [facility])   I-Stat CG4 Lactic  Acid   ED EKG   EKG 12-Lead   Insert peripheral IV   Saline lock IV   Place in observation (patient's expected length of stay will be less than 2 midnights)   Place in observation (patient's expected length of stay will be less than 2 midnights)   VAS US  LOWER EXTREMITY VENOUS (DVT)    Author: Mario LULLA Blanch, MD 12 pm- 8 pm. [facility]. 02/11/2024 5:49 PM Please note for any communication after hours contact [facility] Assigned provider on call on Amion.

## 2024-02-11 NOTE — ED Provider Notes (Signed)
 [place] EMERGENCY DEPARTMENT AT [facility] Provider Note   CSN: [id number] Arrival date & time: 02/11/24  1349     Patient presents with: [name] [name]. is a 83 y.o. male.   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  Pt's wife said he's been more confused for the last several days.  He's been more sob, so she's been turning up his oxygen.  According to chart, he's supposed to be on 2-4 L oxygen, but they have been turning it up to 10.  Today, they were on the way to the dentist and she looked over at him to ask him where the dentist office was and he was asleep and would not wake up.  She was on [facility] so turned around and brought him here.  He had to be lifted out of the vehicle by staff and they brought him straight to a room.  O2 sat 45% upon arrival to the room.  Pt put on a NRB briefly and he did wake up to tell us  his name.  His wife said he's not been using his CPAP much at home.         Prior to Admission medications   Not on File    Allergies: Patient has no known allergies.    Review of Systems  Respiratory:  Positive for shortness of breath.   All other systems reviewed and are negative.   Updated Vital Signs BP (!) 119/46   Pulse 80   Temp (!) 96.5 F (35.8 C) (Axillary)   Resp (!) 24   SpO2 100%   Physical Exam Vitals and nursing note reviewed.  Constitutional:      General: He is in acute distress.     Appearance: He is ill-appearing and toxic-appearing.  HENT:     Head: Normocephalic and atraumatic.     Right Ear: External ear normal.     Left Ear: External ear normal.     Nose: Nose normal.     Mouth/Throat:     Mouth: Mucous membranes are dry.  Eyes:     Extraocular Movements: Extraocular movements intact.     Conjunctiva/sclera: Conjunctivae normal.     Pupils: Pupils are equal, round, and reactive to  light.  Cardiovascular:     Rate and Rhythm: Normal rate and regular rhythm.     Pulses: Normal pulses.     Heart sounds: Normal heart sounds.  Pulmonary:     Effort: Bradypnea and respiratory distress present.     Breath sounds: Decreased air movement present.  Abdominal:     General: Abdomen is flat. Bowel sounds are normal.     Palpations: Abdomen is soft.  Musculoskeletal:        General: Normal range of motion.     Cervical back: Normal range of motion and neck supple.  Skin:    General: Skin is warm.     Capillary Refill: Capillary refill takes less than 2 seconds.  Neurological:     Mental Status: He is lethargic and disoriented.     Comments: After oxygen, he was able to wake up and tell us  his name.    Psychiatric:     Comments: Unable to assess     (all labs ordered are listed, but only abnormal results are displayed) Labs Reviewed  BASIC METABOLIC PANEL WITH  GFR - Abnormal; Notable for the following components:      Result Value   Chloride 88 (*)    CO2 42 (*)    Glucose, Bld 187 (*)    Creatinine, Ser 1.27 (*)    GFR, Estimated 56 (*)    All other components within normal limits  CBC WITH DIFFERENTIAL/PLATELET - Abnormal; Notable for the following components:   WBC 11.1 (*)    RBC 4.00 (*)    Hemoglobin 11.1 (*)    HCT 38.1 (*)    MCHC 29.1 (*)    RDW 15.6 (*)    Neutro Abs 8.6 (*)    Abs Immature Granulocytes 0.09 (*)    All other components within normal limits  CBG MONITORING, ED - Abnormal; Notable for the following components:   Glucose-Capillary 185 (*)    All other components within normal limits  I-STAT VENOUS BLOOD GAS, ED - Abnormal; Notable for the following components:   pH, Ven 7.248 (*)    pCO2, Ven 109.9 (*)    Bicarbonate 47.9 (*)    TCO2 >50 (*)    Acid-Base Excess 16.0 (*)    Calcium , Ion 1.14 (*)    HCT 36.0 (*)    Hemoglobin 12.2 (*)    All other components within normal limits  TROPONIN I (HIGH SENSITIVITY) - Abnormal; Notable  for the following components:   Troponin I (High Sensitivity) 18 (*)    All other components within normal limits  RESP PANEL BY RT-PCR (RSV, FLU A&B, COVID)  RVPGX2  CULTURE, BLOOD (ROUTINE X 2)  CULTURE, BLOOD (ROUTINE X 2)  BRAIN NATRIURETIC PEPTIDE  URINALYSIS, ROUTINE W REFLEX MICROSCOPIC  CBG MONITORING, ED  I-STAT CG4 LACTIC ACID, ED  I-STAT CG4 LACTIC ACID, ED  TROPONIN I (HIGH SENSITIVITY)    EKG: EKG Interpretation Date/Time:  Tuesday February 11 2024 13:58:57 EDT Ventricular Rate:  89 PR Interval:  187 QRS Duration:  93 QT Interval:  442 QTC Calculation: 538 R Axis:   83  Text Interpretation: Sinus rhythm Atrial premature complexes Borderline right axis deviation Prolonged QT interval No old tracing to compare Confirmed by Dean Clarity ([id number]) on 02/11/2024 2:55:39 PM  Radiology: [facility] Chest Port 1 View Result Date: 02/11/2024 CLINICAL DATA:  Shortness of breath EXAM: PORTABLE CHEST 1 VIEW COMPARISON:  X-ray 10/21/2023 and older FINDINGS: Hyperinflation. Chronic lung changes. No consolidation, pneumothorax or effusion. No edema. Normal cardiopericardial silhouette. Degenerative changes along the spine. Overlapping cardiac leads. IMPRESSION: Hyperinflation with chronic changes. Electronically Signed   By: Ranell Bring M.D.   On: 02/11/2024 14:43     Procedures   Medications Ordered in the ED  sodium chloride  0.9 % bolus 500 mL (has no administration in time range)                                    Medical Decision Making Amount and/or Complexity of Data Reviewed Labs: ordered. Radiology: ordered.  Risk Decision regarding hospitalization.   This patient presents to the ED for concern of ams, this involves an extensive number of treatment options, and is a complaint that carries with it a high risk of complications and morbidity.  The differential diagnosis includes hypoglycemia, hypercarbia, cva, infection   Co morbidities that complicate the patient  evaluation  COPD on home O2 (on Hospice; DNR), CAD, HTN, PVD, afib (on Eliquis ), GERD, and neuropathy   Additional history obtained:  Additional history obtained from epic chart review External records from outside source obtained and reviewed including wife   Lab Tests:  I Ordered, and personally interpreted labs.  The pertinent results include:  cbc with hgb 11.1 (10.4 in March); bmp with cr 1.27 (stable); trop nl   Imaging Studies ordered:  I ordered imaging studies including cxr  I independently visualized and interpreted imaging which showed Hyperinflation with chronic changes.  I agree with the radiologist interpretation   Cardiac Monitoring:  The patient was maintained on a cardiac monitor.  I personally viewed and interpreted the cardiac monitored which showed an underlying rhythm of: nsr   Medicines ordered and prescription drug management:  I ordered medication including bipap  for sx  Reevaluation of the patient after these medicines showed that the patient improved I have reviewed the patients home medicines and have made adjustments as needed   Test Considered:  ct   Critical Interventions:  bipap   Consultations Obtained:  I requested consultation with the hospitalist (Dr. Tobie),  and discussed lab and imaging findings as well as pertinent plan - she will admit   Problem List / ED Course:  Resp failure:  pt DNR, so he's put on bipap. Pt will be admitted.   Reevaluation:  After the interventions noted above, I reevaluated the patient and found that they have :improved   Social Determinants of Health:  Lives at home   Dispostion:  After consideration of the diagnostic results and the patients response to treatment, I feel that the patent would benefit from admission.    CRITICAL CARE Performed by: Mliss Boyers   Total critical care time: 30 minutes  Critical care time was exclusive of separately billable procedures and treating  other patients.  Critical care was necessary to treat or prevent imminent or life-threatening deterioration.  Critical care was time spent personally by me on the following activities: development of treatment plan with patient and/or surrogate as well as nursing, discussions with consultants, evaluation of patient's response to treatment, examination of patient, obtaining history from patient or surrogate, ordering and performing treatments and interventions, ordering and review of laboratory studies, ordering and review of radiographic studies, pulse oximetry and re-evaluation of patient's condition.      Final diagnoses:  Acute respiratory failure with hypoxia and hypercapnia [facility])    ED Discharge Orders     None          Boyers Mliss, MD 02/11/24 1549

## 2024-02-12 ENCOUNTER — Observation Stay (HOSPITAL_COMMUNITY)

## 2024-02-12 DIAGNOSIS — R400 Somnolence: Secondary | ICD-10-CM | POA: Diagnosis not present

## 2024-02-12 DIAGNOSIS — Z515 Encounter for palliative care: Secondary | ICD-10-CM

## 2024-02-12 DIAGNOSIS — Z66 Do not resuscitate: Secondary | ICD-10-CM | POA: Diagnosis not present

## 2024-02-12 DIAGNOSIS — J449 Chronic obstructive pulmonary disease, unspecified: Secondary | ICD-10-CM | POA: Diagnosis not present

## 2024-02-12 DIAGNOSIS — J9612 Chronic respiratory failure with hypercapnia: Secondary | ICD-10-CM | POA: Diagnosis not present

## 2024-02-12 DIAGNOSIS — J9622 Acute and chronic respiratory failure with hypercapnia: Secondary | ICD-10-CM

## 2024-02-12 DIAGNOSIS — J9621 Acute and chronic respiratory failure with hypoxia: Secondary | ICD-10-CM

## 2024-02-12 DIAGNOSIS — J9611 Chronic respiratory failure with hypoxia: Secondary | ICD-10-CM

## 2024-02-12 LAB — CBC
HCT: 31.2 % — ABNORMAL LOW (ref 39.0–52.0)
Hemoglobin: 9.3 g/dL — ABNORMAL LOW (ref 13.0–17.0)
MCH: 28.3 pg (ref 26.0–34.0)
MCHC: 29.8 g/dL — ABNORMAL LOW (ref 30.0–36.0)
MCV: 94.8 fL (ref 80.0–100.0)
Platelets: 294 K/uL (ref 150–400)
RBC: 3.29 MIL/uL — ABNORMAL LOW (ref 4.22–5.81)
RDW: 15.9 % — ABNORMAL HIGH (ref 11.5–15.5)
WBC: 11.8 K/uL — ABNORMAL HIGH (ref 4.0–10.5)
nRBC: 0 % (ref 0.0–0.2)

## 2024-02-12 LAB — URINALYSIS, ROUTINE W REFLEX MICROSCOPIC
Bacteria, UA: NONE SEEN
Bacteria, UA: NONE SEEN
Bilirubin Urine: NEGATIVE
Bilirubin Urine: NEGATIVE
Glucose, UA: NEGATIVE mg/dL
Glucose, UA: NEGATIVE mg/dL
Hgb urine dipstick: NEGATIVE
Hgb urine dipstick: NEGATIVE
Ketones, ur: NEGATIVE mg/dL
Ketones, ur: NEGATIVE mg/dL
Leukocytes,Ua: NEGATIVE
Leukocytes,Ua: NEGATIVE
Nitrite: NEGATIVE
Nitrite: NEGATIVE
Protein, ur: NEGATIVE mg/dL
Protein, ur: 30 mg/dL — AB
Specific Gravity, Urine: >1.046 — ABNORMAL HIGH (ref 1.005–1.030)
Specific Gravity, Urine: >1.046 — ABNORMAL HIGH (ref 1.005–1.030)
pH: 5 (ref 5.0–8.0)
pH: 5 (ref 5.0–8.0)

## 2024-02-12 LAB — COMPREHENSIVE METABOLIC PANEL WITH GFR
ALT: 14 U/L (ref 0–44)
AST: 21 U/L (ref 15–41)
Albumin: 2.6 g/dL — ABNORMAL LOW (ref 3.5–5.0)
Alkaline Phosphatase: 53 U/L (ref 38–126)
Anion gap: 12 (ref 5–15)
BUN: 19 mg/dL (ref 8–23)
CO2: 39 mmol/L — ABNORMAL HIGH (ref 22–32)
Calcium: 8.8 mg/dL — ABNORMAL LOW (ref 8.9–10.3)
Chloride: 91 mmol/L — ABNORMAL LOW (ref 98–111)
Creatinine, Ser: 1.06 mg/dL (ref 0.61–1.24)
GFR, Estimated: >60 mL/min (ref >60)
Glucose, Bld: 91 mg/dL (ref 70–99)
Potassium: 4.5 mmol/L (ref 3.5–5.1)
Sodium: 142 mmol/L (ref 135–145)
Total Bilirubin: 0.7 mg/dL (ref 0.0–1.2)
Total Protein: 5.5 g/dL — ABNORMAL LOW (ref 6.5–8.1)

## 2024-02-12 LAB — BLOOD GAS, VENOUS
Acid-Base Excess: 14 mmol/L — ABNORMAL HIGH (ref 0.0–2.0)
Bicarbonate: 42.6 mmol/L — ABNORMAL HIGH (ref 20.0–28.0)
O2 Saturation: 65.9 %
Patient temperature: 36.8
pCO2, Ven: 71 mmHg (ref 44–60)
pH, Ven: 7.38 (ref 7.25–7.43)
pO2, Ven: 36 mmHg (ref 32–45)

## 2024-02-12 LAB — GLUCOSE, CAPILLARY: Glucose-Capillary: 99 mg/dL (ref 70–99)

## 2024-02-12 MED ORDER — CLONAZEPAM 0.5 MG PO TABS: 0.5000 mg | ORAL_TABLET | Freq: Three times a day (TID) | ORAL | Status: DC | PRN

## 2024-02-12 NOTE — ED Notes (Signed)
 Patient disoriented and continues to call out from room.  Does not want to be alone.  Attempted to reorient.  Placed on hospital bed for comfort and per request.

## 2024-02-12 NOTE — ED Notes (Signed)
 Pt IV was leaking pt changed IV removed

## 2024-02-12 NOTE — Assessment & Plan Note (Signed)
 02-12-2024 due to hypercapnia. Mentation improved when his PCO2 down to 71 mmHg today.  02-13-2024 due to hypercapnic respiratory failure. Now resolved.

## 2024-02-12 NOTE — Assessment & Plan Note (Signed)
 02-12-2024 admitted to acute on chronic hypercapnia and CO2 narcosis.  Tolerated bipap. Repeat VBG showed pH 7.38, PCO2 of 71. He probably lives with his PCO2 around high 70s all the time. Stressed importance of keeping his O2 sats 88-92%. His hypoxia is what drives his ventilation. Discussed with wife and son that if pt keeps his O2 sats >93%, he could stop breathing. Wife not sure if pt has cpap or bipap. She thinks cpap. Pt thinks he has bipap. Either way, he does not wear if because he does not like the way the mask fits on his face. Discussed with hospice rep. They will try and get pt nasal pillow and a full face mask to see if either increases his compliance. Pt has tried a mask that just covers his nose/mouth and only his mouth. Both of which he says are very uncomfortable for him.  He is stable for DC. Family and pt wants him to go home today. Continue to f/u with hospice care.  02-13-2024 Confirmed with hospice rep that pt has Bipap on his OSA machine at home. Repeat VBG this AM after he was on bipap most of the night shows VBG pH of 7.4, PCO2 of 73. Consistent with chronic hypercapnic respiratory failure. [employer] has brought liquid O2 tank for him to go home on.

## 2024-02-12 NOTE — Care Management Obs Status (Signed)
 MEDICARE OBSERVATION STATUS NOTIFICATION   Patient Details  Name: [name] [name]. MRN: [id number] Date of Birth: 1941/02/05   Medicare Observation Status Notification Given:  Yes    Debarah Saunas, RN 02/12/2024, 3:55 PM

## 2024-02-12 NOTE — ED Notes (Signed)
 [facility] called

## 2024-02-12 NOTE — Progress Notes (Signed)
 Patient arrived onto the unit from the ED on BIPAP with RN and RT transfer. VS obtained and tele monitor applied. Patient has bilateral skin tears. RN dressed both skin tears with foam dressings. R arm skin tear is 9x2 and L outer arm skin tear is 4x1 and L inner arm skin tear is 2x1. Patient is currently in A Fib, HR trending 90-120s. Patient asymptomatic. Family at bedside. Call bell within reach.

## 2024-02-12 NOTE — Discharge Instructions (Signed)
 Discharge instructions 1. Follow up with your primary care provider in 1-2 weeks following discharge from hospital. 2. Do not let your oxygen saturations get above 93%. Keep them between 88-92%. Adjust your oxygen supply as needed. Will be somewhere between 3-6 Liters/min 3. Follow up with hospice to find a CPAP mask that fits. You may need to change to a bipap machine.

## 2024-02-12 NOTE — Assessment & Plan Note (Signed)
 02-12-2024 continue DNR/DNI status. Discussed with pt, wife and son at bedside.  02-13-2024 continue with DNR/DNI status.

## 2024-02-12 NOTE — ED Provider Notes (Signed)
 [facility] called for patient.  He was getting dressed and ready for d/c and was in the wheelchair when he had some apnea briefly and became unresponsive.  The nurse quickly got him back in bed and put  him on the monitor.  O2 sat back again in the 40s.  He was put on a NRB at 10L and he is now awake and sats are up to 90s.  We have put him back on bipap.  Dr. Laurence notified.   Dean Clarity, MD 02/12/24 [phone]

## 2024-02-12 NOTE — ED Notes (Signed)
 Family at bedside.

## 2024-02-12 NOTE — Evaluation (Deleted)
 Physical Therapy Evaluation Patient Details Name: [name] [name]. MRN: [id number] DOB: 13-Aug-1940 Today's Date: 02/12/2024  History of Present Illness  [name] [name]. is a 83 y.o. male who presented with AMS and low LOA. O2 saturation was 45% upon arrival to ED room. Found to have small subdural hematoma 4 mm on 3 mm on right. PMHx: COPD Gold stage III, PAF on Tikosyn  and Eliquis , chronic hypoxic respiratory failure on 3 to 4 L as needed, HTN, chronic HFpEF, CAD status post stenting in 2019  Clinical Impression  Pt answered PLOF and home set up questions, however per pt's wife his level of function is slightly less than pt remembers. Pt is in Hospice care and has assists with bathing and dressing. Pt is limited in safe mobility by SoB and generalized weakness. Pt is supervision for bed mobility and min A for transfers. Pt unable to progress further due to SoB. SpO2 on 8L dropped to 89      If plan is discharge home, recommend the following: A little help with walking and/or transfers;A lot of help with bathing/dressing/bathroom;Assistance with cooking/housework;Direct supervision/assist for medications management;Direct supervision/assist for financial management;Assist for transportation;Help with stairs or ramp for entrance;Supervision due to cognitive status   Can travel by private vehicle    Yes    Equipment Recommendations None recommended by PT     Functional Status Assessment Patient has not had a recent decline in their functional status     Precautions / Restrictions Precautions Precautions: Fall Recall of Precautions/Restrictions: Intact Precaution/Restrictions Comments: chronic 2-10L O2 Restrictions Weight Bearing Restrictions Per Provider Order: No      Mobility  Bed Mobility Overal bed mobility: Needs Assistance Bed Mobility: Supine to Sit, Sit to Supine     Supine to sit: Contact guard Sit to supine: Supervision        Transfers Overall transfer  level: Needs assistance Equipment used: 1 person hand held assist Transfers: Sit to/from Stand Sit to Stand: Min assist           General transfer comment: Pt has his way of doing things and is bothered by PT close proximity with coming to standing. Pt able to power up but needs min A and use of posterior of legs against the side of the bed, pt requires minA for steadying with lateral steps along bed. Pt limited by SoB and unable to progress mobilization any further.    Ambulation/Gait               General Gait Details: unable due to SoB        Balance Overall balance assessment: Needs assistance Sitting-balance support: Feet supported Sitting balance-[name]: Fair     Standing balance support: Single extremity supported, During functional activity Standing balance-[name]: Fair                               Pertinent Vitals/Pain Pain Assessment Pain Assessment: Faces Faces Pain Scale: Hurts a little bit Pain Location: skin tear on L arm Pain Descriptors / Indicators: Discomfort, Grimacing Pain Intervention(s): Limited activity within patient's tolerance, Monitored during session, Repositioned    Home Living Family/patient expects to be discharged to:: Private residence Living Arrangements: Spouse/significant other Available Help at Discharge: Family;Personal care attendant;Available 24 hours/day Type of Home: House Home Access: Level entry       Home Layout: One level Home Equipment: Agricultural consultant (2 wheels);Rollator (4 wheels);BSC/3in1;Shower seat;Wheelchair -  manual;Electric scooter      Prior Function Prior Level of Function : Needs assist             Mobility Comments: intermittent assist with transfers, uses WC, rollator or RW in the home; electric scooter outdoors. Denies falls. ADLs Comments: assist 2x/wk for showers, daily assist for dressing. Wife does all IADLs     Extremity/Trunk Assessment   Upper Extremity  Assessment Upper Extremity Assessment: Defer to OT evaluation    Lower Extremity Assessment Lower Extremity Assessment: Generalized weakness    Cervical / Trunk Assessment Cervical / Trunk Assessment: Kyphotic (mild)  Communication   Communication Communication: No apparent difficulties    Cognition Arousal: Alert Behavior During Therapy: WFL for tasks assessed/performed                             Following commands: Intact       Cueing Cueing Techniques: Verbal cues     General Comments General comments (skin integrity, edema, etc.): SpO2 on 8L O2 via [place] 87%O2 with mobiization, and associated  SoB and cues for pursed lip breathing and pt able to recover to 90%O2 HR in low 120s, RN to give breathing treatment at end of Eval        Assessment/Plan    PT Assessment Patient does not need any further PT services         PT Goals (Current goals can be found in the Care Plan section)  Acute Rehab PT Goals PT Goal Formulation: All assessment and education complete, DC therapy     AM-PAC PT 6 Clicks Mobility  Outcome Measure Help needed turning from your back to your side while in a flat bed without using bedrails?: None Help needed moving from lying on your back to sitting on the side of a flat bed without using bedrails?: None Help needed moving to and from a bed to a chair (including a wheelchair)?: A Little Help needed standing up from a chair using your arms (e.g., wheelchair or bedside chair)?: A Little Help needed to walk in hospital room?: A Little Help needed climbing 3-5 steps with a railing? : A Lot 6 Click Score: 19    End of Session Equipment Utilized During Treatment: Gait belt Activity Tolerance: Treatment limited secondary to medical complications (Comment) (increased O2 demand, SoB) Patient left: in bed;with call bell/phone within reach;with bed alarm set;with family/visitor present Nurse Communication: Mobility status PT Visit  Diagnosis: Other abnormalities of gait and mobility (R26.89);Difficulty in walking, not elsewhere classified (R26.2);Muscle weakness (generalized) (M62.81)    Time: 9094-9066 PT Time Calculation (min) (ACUTE ONLY): 28 min   Charges:   PT Evaluation $PT Eval Moderate Complexity: 1 Mod PT Treatments $Therapeutic Activity: 8-22 mins PT General Charges $$ ACUTE PT VISIT: 1 Visit         Nalina Yeatman B. Fleeta Lapidus PT, DPT Acute Rehabilitation Services Please use secure chat or  Call Office [phone]   Almarie KATHEE Fleeta [facility] 02/12/2024, 10:42 AM

## 2024-02-12 NOTE — Progress Notes (Signed)
 PROGRESS NOTE    [name] [name].  FMW:[id number] DOB: [birth year] DOA: 02/11/2024 PCP: Pcp, No  Subjective: Pt seen and examined. Met with pt, wife, son and hospice rep at bedside. Discussed with hospice rep getting patient a full face mask and nasal pillows to try to increase his compliance with non-invasive ventilation at home. Pt and wife unclear if he has cpap vs bipap machine at home.   Hospital Course: No notes on file   Assessment and Plan: * AMS (altered mental status) 02-12-2024 due to hypercapnia. Mentation improved when his PCO2 down to 71 mmHg today.  DNR (do not resuscitate)/DNI(Do Not Intubate) 02-12-2024 continue DNR/DNI status. Discussed with pt, wife and son at bedside.  Hospice care patient 02-12-2024 pt continues with hospice care at home.  Acute on chronic respiratory failure with hypoxia and hypercapnia (HCC) 02-12-2024 admitted to acute on chronic hypercapnia and CO2 narcosis.  Tolerated bipap. Repeat VBG showed pH 7.38, PCO2 of 71. He probably lives with his PCO2 around high 70s all the time. Stressed importance of keeping his O2 sats 88-92%. His hypoxia is what drives his ventilation. Discussed with wife and son that if pt keeps his O2 sats >93%, he could stop breathing. Wife not sure if pt has cpap or bipap. She thinks cpap. Pt thinks he has bipap. Either way, he does not wear if because he does not like the way the mask fits on his face. Discussed with hospice rep. They will try and get pt nasal pillow and a full face mask to see if either increases his compliance. Pt has tried a mask that just covers his nose/mouth and only his mouth. Both of which he says are very uncomfortable for him.  He is stable for DC. Family and pt wants him to go home today. Continue to f/u with hospice care.  Chronic respiratory failure with hypoxia and hypercapnia (HCC) - baseline O2 requirement 4-6 L/min.  keep O2 sats 88-92%. baseline PCO2 high 70s-low 80s mmHg 02-12-2024 He  probably lives with his PCO2 around high 70s all the time. Stressed importance of keeping his O2 sats 88-92%. His hypoxia is what drives his ventilation. Discussed with wife and son that if pt keeps his O2 sats >93%, he could stop breathing.    DVT prophylaxis:   Hospice patieint   Code Status: Limited: Do not attempt resuscitation (DNR) -DNR-LIMITED -Do Not Intubate/DNI  Family Communication: discussed at bedside with pt, wife and son Disposition Plan: return home Reason for continuing need for hospitalization: stable for DC  Objective: Vitals:   02/12/24 0915 02/12/24 0930 02/12/24 1330 02/12/24 1346  BP: (!) 148/110 (!) 140/70 (!) 114/94   Pulse: 89 96    Resp: 20  (!) 23 (!) 21  Temp:    98.1 F (36.7 C)  TempSrc:      SpO2: 100% 97%      Intake/Output Summary (Last 24 hours) at 02/12/2024 1356 Last data filed at 02/12/2024 1106 Gross per 24 hour  Intake 950.28 ml  Output --  Net 950.28 ml   There were no vitals filed for this visit.  Examination:  Physical Exam Vitals and nursing note reviewed.  Constitutional:      General: He is not in acute distress.    Comments: Appears chronically ill  HENT:     Head: Normocephalic and atraumatic.  Cardiovascular:     Rate and Rhythm: Normal rate.  Pulmonary:     Effort: No respiratory distress.  Comments: Scattered wheezing. Coarse BS. Limited air exchange. No respiratory distress Abdominal:     General: Abdomen is flat. Bowel sounds are normal.  Musculoskeletal:     Right lower leg: No edema.     Left lower leg: No edema.  Skin:    General: Skin is warm and dry.     Capillary Refill: Capillary refill takes less than 2 seconds.  Neurological:     General: No focal deficit present.     Mental Status: He is alert and oriented to person, place, and time.     Data Reviewed: I have personally reviewed following labs and imaging studies  CBC: Recent Labs  Lab 02/11/24 1404 02/11/24 1409 02/12/24 0556  WBC  11.1*  --  11.8*  NEUTROABS 8.6*  --   --   HGB 11.1* 12.2* 9.3*  HCT 38.1* 36.0* 31.2*  MCV 95.3  --  94.8  PLT 364  --  294   Basic Metabolic Panel: Recent Labs  Lab 02/11/24 1404 02/11/24 1409 02/11/24 1820 02/12/24 0556  NA 140 137  --  142  K 5.1 5.1  --  4.5  CL 88*  --   --  91*  CO2 42*  --   --  39*  GLUCOSE 187*  --   --  91  BUN 23  --   --  19  CREATININE 1.27*  --   --  1.06  CALCIUM  9.0  --   --  8.8*  MG  --   --  2.0  --    GFR: CrCl cannot be calculated (Unknown ideal weight.). Liver Function Tests: Recent Labs  Lab 02/12/24 0556  AST 21  ALT 14  ALKPHOS 53  BILITOT 0.7  PROT 5.5*  ALBUMIN 2.6*   Coagulation Profile: Recent Labs  Lab 02/11/24 1404  INR 1.1   Cardiac Enzymes: Recent Labs  Lab 02/11/24 1800  CKTOTAL 47*   BNP (last 3 results) Recent Labs    02/11/24 1404  BNP 187.3*   CBG: Recent Labs  Lab 02/11/24 1351  GLUCAP 185*   Sepsis Labs: Recent Labs  Lab 02/11/24 1409 02/11/24 1828  LATICACIDVEN 1.9 0.7    Recent Results (from the past 240 hours)  Resp panel by RT-PCR (RSV, Flu A&B, Covid) Anterior Nasal Swab     Status: None   Collection Time: 02/11/24  1:57 PM   Specimen: Anterior Nasal Swab  Result Value Ref Range Status   SARS Coronavirus 2 by RT PCR NEGATIVE NEGATIVE Final   Influenza A by PCR NEGATIVE NEGATIVE Final   Influenza B by PCR NEGATIVE NEGATIVE Final    Comment: (NOTE) The Xpert Xpress SARS-CoV-2/FLU/RSV plus assay is intended as an aid in the diagnosis of influenza from Nasopharyngeal swab specimens and should not be used as a sole basis for treatment. Nasal washings and aspirates are unacceptable for Xpert Xpress SARS-CoV-2/FLU/RSV testing.  Fact Sheet for Patients: BloggerCourse.com  Fact Sheet for Healthcare Providers: SeriousBroker.it  This test is not yet approved or cleared by the [place]  FDA and has been authorized for  detection and/or diagnosis of SARS-CoV-2 by FDA under an Emergency Use Authorization (EUA). This EUA will remain in effect (meaning this test can be used) for the duration of the COVID-19 declaration under Section 564(b)(1) of the Act, 21 U.S.C. section 360bbb-3(b)(1), unless the authorization is terminated or revoked.     Resp Syncytial Virus by PCR NEGATIVE NEGATIVE Final    Comment: (NOTE) Fact Sheet  for Patients: BloggerCourse.com  Fact Sheet for Healthcare Providers: SeriousBroker.it  This test is not yet approved or cleared by the [place]  FDA and has been authorized for detection and/or diagnosis of SARS-CoV-2 by FDA under an Emergency Use Authorization (EUA). This EUA will remain in effect (meaning this test can be used) for the duration of the COVID-19 declaration under Section 564(b)(1) of the Act, 21 U.S.C. section 360bbb-3(b)(1), unless the authorization is terminated or revoked.  Performed at [facility], [address] [place], [place] [address]   Culture, blood (routine x 2)     Status: None (Preliminary result)   Collection Time: 02/11/24  2:04 PM   Specimen: BLOOD  Result Value Ref Range Status   Specimen Description BLOOD LEFT ANTECUBITAL  Final   Special Requests   Final    BOTTLES DRAWN AEROBIC AND ANAEROBIC Blood Culture adequate volume   Culture   Final    NO GROWTH < 24 HOURS Performed at [facility], [address], [place], [place] [address]    Report Status PENDING  Incomplete  Culture, blood (routine x 2)     Status: None (Preliminary result)   Collection Time: 02/11/24  2:14 PM   Specimen: BLOOD LEFT WRIST  Result Value Ref Range Status   Specimen Description BLOOD LEFT WRIST  Final   Special Requests   Final    BOTTLES DRAWN AEROBIC AND ANAEROBIC Blood Culture adequate volume   Culture   Final    NO GROWTH < 24 HOURS Performed at [facility], [address]  [place], [place] [address]    Report Status PENDING  Incomplete     Radiology Studies: CT HEAD WO CONTRAST ( ) Result Date: 02/12/2024 CLINICAL DATA:  Mental status change. 8 HOURS FROM FIRST STUDY PLEASE RETIME. EXAM: CT HEAD WITHOUT CONTRAST TECHNIQUE: Contiguous axial images were obtained from the base of the skull through the vertex without intravenous contrast. RADIATION DOSE REDUCTION: This exam was performed according to the departmental dose-optimization program which includes automated exposure control, adjustment of the mA and/or kV according to patient size and/or use of iterative reconstruction technique. COMPARISON:  CT head 02/11/2024. FINDINGS: Brain: No change in possible trace bilateral subdural fluid collections. No convincing subarachnoid hemorrhage. No evidence of acute infarction, hydrocephalus, extra-axial collection or mass lesion/mass effect. Patchy white matter hypodensities are nonspecific but compatible with chronic microvascular ischemic change. Vascular: No hyperdense vessel identified. Skull: No acute fracture. Sinuses/Orbits: Mostly clear sinuses.  No acute orbital findings. IMPRESSION: 1. No change in possible trace low density bilateral subdural hematomas. No mass effect. 2. No convincing subarachnoid hemorrhage. 3. No new/interval acute abnormality. Electronically Signed   By: Gilmore GORMAN Molt M.D.   On: 02/12/2024 02:21   CT Head Wo Contrast Result Date: 02/11/2024 EXAM: CT HEAD WITHOUT CONTRAST 02/11/2024 06:06:00 PM TECHNIQUE: CT of the head was performed without the administration of intravenous contrast. Automated exposure control, iterative reconstruction, and/or weight based adjustment of the mA/kV was utilized to reduce the radiation dose to as low as reasonably achievable. COMPARISON: None available. CLINICAL HISTORY: Mental status change, unknown cause. FINDINGS: BRAIN AND VENTRICLES: Generalized cerebral atrophy. Advanced chronic small vessel ischemic disease.  Suspected small bilateral subdural hematomas measuring 4 mm in radial dimension on the left and 3 mm on the right (see series 5, image 46). Minimal, if any, mass effect. No midline shift. Hyperdensity deep to the subdural hematomas may be artifactual or represent a small amount of subarachnoid  hemorrhage. ORBITS: No acute abnormality. SINUSES: No acute abnormality. SOFT TISSUES AND SKULL: No acute soft tissue abnormality. No skull fracture. IMPRESSION: 1. Suspected small bilateral subdural hematomas (4 mm left, 3 mm right) with minimal if any mass effect and no midline shift. Associated deep hyperdensity may be artifactual or represent a small amount of subarachnoid hemorrhage. 2. Generalized cerebral atrophy and advanced chronic small vessel ischemic disease. Critical results reported by telephone at 7:00 pm onJul 15 2025 to Dr. Tobie who verbalized acknowledged these results Electronically signed by: Norman Gatlin MD 02/11/2024 07:02 PM EDT RP Workstation: [id number]   CT Angio Chest PE W and/or Wo Contrast Result Date: 02/11/2024 CLINICAL DATA:  Pulmonary embolism (PE) suspected, high prob EXAM: CT ANGIOGRAPHY CHEST WITH CONTRAST TECHNIQUE: Multidetector CT imaging of the chest was performed using the standard protocol during bolus administration of intravenous contrast. Multiplanar CT image reconstructions and MIPs were obtained to evaluate the vascular anatomy. RADIATION DOSE REDUCTION: This exam was performed according to the departmental dose-optimization program which includes automated exposure control, adjustment of the mA and/or kV according to patient size and/or use of iterative reconstruction technique. CONTRAST:  75mL OMNIPAQUE  IOHEXOL  350 MG/ML SOLN COMPARISON:  Chest radiograph same date. Chest CTA 05/31/2023. Chest CT 11/07/2022. FINDINGS: Cardiovascular: The pulmonary arteries are well opacified with contrast to the level of the segmental branches. There is no evidence of acute pulmonary  embolism. Diffuse atherosclerosis of the aorta, great vessels and coronary arteries. No acute systemic arterial abnormalities are identified. There are calcifications of the aortic valve and lipomatous hypertrophy of the interatrial septum. The heart size is normal. There is no pericardial effusion. Mediastinum/Nodes: Similar mildly prominent hilar and subcarinal lymph nodes, likely reactive. No progressive thoracic adenopathy. The thyroid gland, trachea and esophagus demonstrate no significant findings. Lungs/Pleura: New small left to the right pleural effusions with increased central airway thickening and mucous plugging in both lower lobes. Moderate centrilobular and paraseptal emphysema. New band like opacity in the superior segment of the right lower lobe measuring up to 1.6 cm transverse on image 84/6. There is also new clustered nodularity within the lingula (image 107/6). Other scattered pulmonary nodules bilaterally are similar to previous CTs, including a solid left lower lobe nodule measuring 1.1 x 0.9 cm on image 96/6. Upper abdomen: The visualized upper abdomen appears stable, without acute or significant findings. Aortic and branch vessel atherosclerosis. Musculoskeletal/Chest wall: There is no chest wall mass or suspicious osseous finding. 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 Review of the MIP images confirms the above findings. IMPRESSION: 1. No evidence of acute pulmonary embolism or other acute vascular findings. 2. New small left to the right pleural effusions with increased central airway thickening and mucous plugging in both lower lobes, likely due to bronchitis. 3. New band like opacity in the superior segment of the right lower lobe and clustered nodularity in the lingula, likely infectious/inflammatory. Consider follow-up, as clinically warranted. 4. Other scattered pulmonary nodules bilaterally are similar to previous CT's. 5. Aortic  Atherosclerosis (ICD10-I70.0) and Emphysema (ICD10-J43.9). Electronically Signed   By: Elsie Perone M.D.   On: 02/11/2024 18:36   DG Chest Port 1 View Result Date: 02/11/2024 CLINICAL DATA:  Shortness of breath EXAM: PORTABLE CHEST 1 VIEW COMPARISON:  X-ray 10/21/2023 and older FINDINGS: Hyperinflation. Chronic lung changes. No consolidation, pneumothorax or effusion. No edema. Normal cardiopericardial silhouette. Degenerative changes along the spine. Overlapping cardiac leads. IMPRESSION: Hyperinflation with chronic changes. Electronically Signed   By: Ranell Bring  M.D.   On: 02/11/2024 14:43    Scheduled Meds:  budesonide  (PULMICORT ) nebulizer solution  0.25 mg Nebulization BID   diltiazem   120 mg Oral Daily   dofetilide   250 mcg Oral BID   finasteride   5 mg Oral q AM   gabapentin   200 mg Oral QHS   ipratropium-albuterol   3 mL Nebulization Q6H   pantoprazole   40 mg Oral q AM   rosuvastatin   20 mg Oral Daily   sodium chloride  flush  3 mL Intravenous Q12H   Continuous Infusions:  ceFEPime  (MAXIPIME ) IV Stopped (02/12/24 1106)   dextrose  5 % and 0.9 % NaCl Stopped (02/12/24 0310)   doxycycline  (VIBRAMYCIN ) IV 100 mg (02/12/24 1121)     LOS: 0 days   Time spent: 55 minutes  Camellia Door, DO  [facility]  02/12/2024, 1:56 PM

## 2024-02-12 NOTE — Assessment & Plan Note (Signed)
 02-12-2024 He probably lives with his PCO2 around high 70s all the time. Stressed importance of keeping his O2 sats 88-92%. His hypoxia is what drives his ventilation. Discussed with wife and son that if pt keeps his O2 sats >93%, he could stop breathing.   02-13-2024 baseline PCO2 of low 70s.  Keep O2 sats between 88-92%. On 3-6 liters per min of O2.

## 2024-02-12 NOTE — Evaluation (Signed)
 Physical Therapy Evaluation Patient Details Name: [name] [name]. MRN: [id number] DOB: [birth year] Today's Date: 02/12/2024  History of Present Illness  [name] [name]. is a 83 y.o. male who presented with AMS and low LOA. O2 saturation was 45% upon arrival to ED room. Found to have small subdural hematoma 4 mm on 3 mm on right. PMHx: COPD Gold stage III, PAF on Tikosyn  and Eliquis , chronic hypoxic respiratory failure on 3 to 4 L as needed, HTN, chronic HFpEF, CAD status post stenting in 2019  Clinical Impression  PTA pt living with wife using wheelchair for mobilization in home, and electric scooter outside. Pt is under Hospice care and is receiving assist for dressing daily and bathing 2x/wk. Pt is limited in safe mobility today by 4/4 DOE on 8L O2 via [place], with SpO2 <90% and generalized weakness. Pt is supervision for bed mobility and min A for transfers which wife reports is below his baseline. PT recommending HHPT at discharge to return to PLOF. PT will continue to follow acutely and will refer to Mobility Specialist       If plan is discharge home, recommend the following: A little help with walking and/or transfers;A lot of help with bathing/dressing/bathroom;Assistance with cooking/housework;Direct supervision/assist for medications management;Direct supervision/assist for financial management;Assist for transportation;Help with stairs or ramp for entrance;Supervision due to cognitive status   Can travel by private vehicle   YesYes    Equipment Recommendations None recommended by PT     Functional Status Assessment Patient has had a recent decline in their functional status and demonstrates the ability to make significant improvements in function in a reasonable and predictable amount of time.     Precautions / Restrictions Precautions Precautions: Fall Recall of Precautions/Restrictions: Intact Precaution/Restrictions Comments: chronic 2-10L O2 Restrictions Weight Bearing  Restrictions Per Provider Order: No      Mobility  Bed Mobility Overal bed mobility: Needs Assistance Bed Mobility: Supine to Sit, Sit to Supine     Supine to sit: Contact guard Sit to supine: Supervision        Transfers Overall transfer level: Needs assistance Equipment used: 1 person hand held assist Transfers: Sit to/from Stand Sit to Stand: Min assist           General transfer comment: Pt has his way of doing things and is bothered by PT close proximity with coming to standing. Pt able to power up but needs min A and use of posterior of legs against the side of the bed, pt requires minA for steadying with lateral steps along bed. Pt limited by SoB and unable to progress mobilization any further.    Ambulation/Gait               General Gait Details: unable due to SoB        Balance Overall balance assessment: Needs assistance Sitting-balance support: Feet supported Sitting balance-[name]: Fair     Standing balance support: Single extremity supported, During functional activity Standing balance-[name]: Fair                               Pertinent Vitals/Pain Pain Assessment Pain Assessment: Faces Faces Pain Scale: Hurts a little bit Pain Location: skin tear on L arm Pain Descriptors / Indicators: Discomfort, Grimacing Pain Intervention(s): Limited activity within patient's tolerance, Monitored during session, Repositioned    Home Living Family/patient expects to be discharged to:: Private residence Living Arrangements: Spouse/significant other  Available Help at Discharge: Family;Personal care attendant;Available 24 hours/day Type of Home: House Home Access: Level entry       Home Layout: One level Home Equipment: Agricultural consultant (2 wheels);Rollator (4 wheels);BSC/3in1;Shower seat;Wheelchair - IT trainer      Prior Function Prior Level of Function : Needs assist             Mobility Comments:  intermittent assist with transfers, uses WC, rollator or RW in the home; electric scooter outdoors. Denies falls. ADLs Comments: assist 2x/wk for showers, daily assist for dressing. Wife does all IADLs     Extremity/Trunk Assessment   Upper Extremity Assessment Upper Extremity Assessment: Defer to OT evaluation    Lower Extremity Assessment Lower Extremity Assessment: Generalized weakness    Cervical / Trunk Assessment Cervical / Trunk Assessment: Kyphotic (mild)  Communication   Communication Communication: No apparent difficulties    Cognition Arousal: Alert Behavior During Therapy: WFL for tasks assessed/performed                             Following commands: Intact       Cueing Cueing Techniques: Verbal cues     General Comments General comments (skin integrity, edema, etc.): SpO2 on 8L O2 via [place]  87%O2 with mobiization, and associated  SoB and cues for pursed lip breathing and pt able to recover to 90%O2 HR in low 120s, RN to give breathing treatment at end of Eval        Assessment/Plan    PT Assessment Patient needs continued PT services  PT Problem List Decreased strength;Decreased activity tolerance;Decreased balance;Cardiopulmonary status limiting activity       PT Treatment Interventions Gait training;Functional mobility training;Balance training;Therapeutic activities;Patient/family education;Wheelchair mobility training    PT Goals (Current goals can be found in the Care Plan section)  Acute Rehab PT Goals PT Goal Formulation: With patient/family Time For Goal Achievement: 02/26/24 Potential to Achieve Goals: Fair    Frequency Min 2X/week        AM-PAC PT 6 Clicks Mobility  Outcome Measure Help needed turning from your back to your side while in a flat bed without using bedrails?: None Help needed moving from lying on your back to sitting on the side of a flat bed without using bedrails?: None Help needed moving to and from a  bed to a chair (including a wheelchair)?: A Little Help needed standing up from a chair using your arms (e.g., wheelchair or bedside chair)?: A Little Help needed to walk in hospital room?: A Little Help needed climbing 3-5 steps with a railing? : A Lot 6 Click Score: 19    End of Session Equipment Utilized During Treatment: Gait belt Activity Tolerance: Treatment limited secondary to medical complications (Comment) (increased O2 demand, SoB) Patient left: in bed;with call bell/phone within reach;with bed alarm set;with family/visitor present Nurse Communication: Mobility status PT Visit Diagnosis: Other abnormalities of gait and mobility (R26.89);Difficulty in walking, not elsewhere classified (R26.2);Muscle weakness (generalized) (M62.81)    Time: 9094-9066 PT Time Calculation (min) (ACUTE ONLY): 28 min   Charges:   PT Evaluation $PT Eval Moderate Complexity: 1 Mod PT Treatments $Therapeutic Activity: 8-22 mins PT General Charges $$ ACUTE PT VISIT: 1 Visit         Carron Mcmurry B. Fleeta Lapidus PT, DPT Acute Rehabilitation Services Please use secure chat or  Call Office [phone]   Almarie KATHEE Fleeta [facility] 02/12/2024, 10:54 AM

## 2024-02-12 NOTE — Assessment & Plan Note (Signed)
 02-12-2024 pt continues with hospice care at home.  02-13-2024 f.u with hospice

## 2024-02-12 NOTE — Progress Notes (Signed)
   As RN was getting him ready to go home, pt became unresponsive. Sats in the 40s. Pt had been on his home portable pulsed-oxygen concentrator. It may not be enough. Once he was placed back on continuous O2, pt is now alert and oriented. Family does not have liquid O2 tank for discharge. Will need to arrange in AM for liquid O2. Asked family to bring in his CPAP/Bipap machine so we can check to see what kind of NIV machine he has.  Discussed with wife and son at bedside.  Discharge canceled. Will need progressive bed for bipap.  Camellia Door, DO [facility]

## 2024-02-12 NOTE — ED Notes (Addendum)
 While discharging patient into wheelchair, [name] [name] became unresponsive to voice. Nonrebreather mask applied for oxygenation saturation in the 60s%. Transferred back into bed safely. MD Laurence GUTTING MD [facility] notified of event

## 2024-02-12 NOTE — Evaluation (Signed)
 Occupational Therapy Evaluation Patient Details Name: [name] [name]. MRN: [id number] DOB: [birth year] Today's Date: 02/12/2024   History of Present Illness   [name] [name]. is a 83 y.o. male who presented with AMS and low LOA. O2 saturation was 45% upon arrival to ED room. Found to have small subdural hematoma 4 mm on 3 mm on right. PMHx: COPD Gold stage III, PAF on Tikosyn  and Eliquis , chronic hypoxic respiratory failure on 3 to 4 L as needed, HTN, chronic HFpEF, CAD status post stenting in 2019     Clinical Impressions [name] [name] was evaluated s/p the above admission list. He needs assist for mobility and ADLs from family and PCA's at baseline, he walks very short distances with AD, uses a WC in the home a scooter outside and has help for all ADLs. He reports using 2-10L O2 at home depending on his activity level. Upon evaluation the pt was limited by increased O2 needs, elevated RR, generalized weakness,  painful skin tear on L forearm, unsteady gait and decreased activity tolerance. Overall he did not need assist for bed mobility and had fair sitting EOB balance for Adls. He required minA for standing and side stepping along the EOB with HHA. Due to the deficits listed below the pt also needs up to mod A for LB ADLs and set up A for UB ADLs. Pt is likely near his functional baseline however he will benefit from continued acute OT services and HHOT.  8L maintained throughout, SpO2 down to 89% with activity, RR to 30s, vitals quickly stabilize with cues to PLB and rest.       If plan is discharge home, recommend the following:   A little help with walking and/or transfers;A lot of help with bathing/dressing/bathroom;Assistance with cooking/housework;Assist for transportation;Help with stairs or ramp for entrance     Functional Status Assessment   Patient has had a recent decline in their functional status and demonstrates the ability to make significant improvements in function in  a reasonable and predictable amount of time.     Equipment Recommendations   None recommended by OT      Precautions/Restrictions   Precautions Precautions: Fall Recall of Precautions/Restrictions: Intact Precaution/Restrictions Comments: chronic 2-10L O2 Restrictions Weight Bearing Restrictions Per Provider Order: No     Mobility Bed Mobility Overal bed mobility: Needs Assistance Bed Mobility: Supine to Sit, Sit to Supine     Supine to sit: Contact guard Sit to supine: Supervision        Transfers Overall transfer level: Needs assistance Equipment used: 1 person hand held assist Transfers: Sit to/from Stand Sit to Stand: Min assist           General transfer comment: pt mildly impulsive with STS, min A for balance and safety. pt able to side step along the EOB with HHA      Balance Overall balance assessment: Needs assistance Sitting-balance support: Feet supported Sitting balance-[name]: Fair     Standing balance support: Single extremity supported, During functional activity Standing balance-[name]: Fair                             ADL either performed or assessed with clinical judgement   ADL Overall ADL's : Needs assistance/impaired Eating/Feeding: Independent   Grooming: Set up;Sitting   Upper Body Bathing: Set up;Sitting   Lower Body Bathing: Moderate assistance;Sit to/from stand   Upper Body Dressing : Set up;Sitting  Lower Body Dressing: Moderate assistance;Sit to/from stand   Toilet Transfer: Minimal assistance;Stand-pivot;BSC/3in1   Toileting- Clothing Manipulation and Hygiene: Contact guard assist;Sit to/from stand;Sitting/lateral lean       Functional mobility during ADLs: Minimal assistance General ADL Comments: pt used urinal on the side of the bed on evaluation. pt slightly impulsive with STS, min A for balance and safety. cues for PLB. increased RR with activity. Pt on 8L throughout     Vision  Baseline Vision/History: 1 Wears glasses Patient Visual Report: Other (comment) (floaters) Vision Assessment?: Vision impaired- to be further tested in functional context Additional Comments: pt reports new floaters that were not present before     Perception Perception: Within Functional Limits       Praxis Praxis: [facility]       Pertinent Vitals/Pain Pain Assessment Pain Assessment: Faces Faces Pain Scale: Hurts a little bit Pain Location: skin tear on L arm Pain Descriptors / Indicators: Discomfort, Grimacing Pain Intervention(s): Limited activity within patient's tolerance, Monitored during session     Extremity/Trunk Assessment Upper Extremity Assessment Upper Extremity Assessment: Generalized weakness   Lower Extremity Assessment Lower Extremity Assessment: Defer to PT evaluation   Cervical / Trunk Assessment Cervical / Trunk Assessment: Kyphotic (mild)   Communication Communication Communication: No apparent difficulties   Cognition Arousal: Alert Behavior During Therapy: WFL for tasks assessed/performed Cognition: No family/caregiver present to determine baseline             OT - Cognition Comments: likely baseline, alert and oriented. follows all commands.                 Following commands: Intact       Cueing  General Comments   Cueing Techniques: Verbal cues  SpO2 to 89% on 8L during activity. HR to 110s. RR to 34   Exercises     Shoulder Instructions      Home Living Family/patient expects to be discharged to:: Private residence Living Arrangements: Spouse/significant other Available Help at Discharge: Family;Personal care attendant;Available 24 hours/day Type of Home: House Home Access: Level entry     Home Layout: One level     Bathroom Shower/Tub: Producer, television/film/video: Handicapped height     Home Equipment: Agricultural consultant (2 wheels);Rollator (4 wheels);BSC/3in1;Shower seat;Wheelchair - Production manager          Prior Functioning/Environment Prior Level of Function : Needs assist             Mobility Comments: intermittent assist with transfers, uses WC, rollator or RW in the home; electric scooter outdoors. Denies falls. ADLs Comments: assist 2x/wk for showers, daily assist for dressing. Wife does all IADLs    OT Problem List: Decreased strength;Decreased range of motion;Decreased activity tolerance;Impaired balance (sitting and/or standing);Decreased cognition;Decreased safety awareness;Decreased knowledge of use of DME or AE;Decreased knowledge of precautions   OT Treatment/Interventions: Self-care/ADL training;Therapeutic exercise;DME and/or AE instruction;Energy conservation;Therapeutic activities;Patient/family education;Balance training      OT Goals(Current goals can be found in the care plan section)   Acute Rehab OT Goals Patient Stated Goal: home OT Goal Formulation: With patient Time For Goal Achievement: 02/26/24 Potential to Achieve Goals: Good ADL Goals Pt Will Perform Upper Body Dressing: with modified independence Pt Will Perform Lower Body Dressing: with contact guard assist Pt Will Transfer to Toilet: with supervision;ambulating Additional ADL Goal #1: pt will tolerate 5 minutes of functional activity while maintaining SpO2 >92% using energy conservation strategies   OT Frequency:  Min 2X/week  Co-evaluation              AM-PAC OT 6 Clicks Daily Activity     Outcome Measure Help from another person eating meals?: None Help from another person taking care of personal grooming?: A Little Help from another person toileting, which includes using toliet, bedpan, or urinal?: A Little Help from another person bathing (including washing, rinsing, drying)?: A Lot Help from another person to put on and taking off regular upper body clothing?: A Little Help from another person to put on and taking off regular lower body clothing?: A Lot 6  Click Score: 17   End of Session Equipment Utilized During Treatment: Gait belt;Oxygen Nurse Communication: Mobility status  Activity Tolerance: Patient tolerated treatment well Patient left: in bed;with call bell/phone within reach  OT Visit Diagnosis: Unsteadiness on feet (R26.81);Other abnormalities of gait and mobility (R26.89);Muscle weakness (generalized) (M62.81)                Time: 9189-9149 OT Time Calculation (min): 40 min Charges:  OT General Charges $OT Visit: 1 Visit OT Evaluation $OT Eval Moderate Complexity: 1 Mod OT Treatments $Self Care/Home Management : 8-22 mins $Therapeutic Activity: 8-22 mins  Lucie Kendall, OTR/L Acute Rehabilitation Services Office [phone] Secure Chat Communication Preferred   Lucie JONETTA Kendall 02/12/2024, 9:02 AM

## 2024-02-12 NOTE — Progress Notes (Signed)
 Venous duplex lower ext  has been completed. Refer to [facility] under chart review to view preliminary results.   02/12/2024  4:37 PM Judea Riches, Ricka BIRCH

## 2024-02-12 NOTE — Progress Notes (Signed)
 [facility] Patient Visit  [name] [name] is a current hospice patient, with hospice diagnosis of COPD. He was admitted 7.15.25 with diagnosis of acute respiratory failure with hypoxia and hypercapnia. [facility] was notified by patient's spouse upon arrival to ED. Per Dr. Norleen Laurence, hospice physician, this is a related hospital admission.   Visited with patient in ED multiple times today. Patient was planning to discharge home but had repeated episode of unresponsiveness when attempting to get into wheelchair for discharge. He had hypoxia that responded to increased oxygen delivery. Plan for admission overnight with Bipap use. Patient's current CPAP home machine can provide Bipap as well upon discharge if needed. Also, attending is requesting liquid O2 portable tank for discharge instead of demand portable O2 concentrator. Spouse and patient's son involved in multiple discussions today.  Patient is GIP appropriate for evaluation of sudden unresponsiveness  Vital Signs: 98.2/92/22 122/58   O2 94% 6L via [place]  I&O:  850.3/not documented  Abnormal labs: PH 7.248, pCO2 109.9, TCO2 >50, Acid/Base excess 16, bicarb 47.9, HGB 12.2, Ch 91, Ca 8.8, Alb 2.6, Tpro 5.5, WBC 11.8, UA positive for protein, BC x 2 pending.   Diagnostics: chest xray without significant findings.   CT head x 2 with stable subdural hematomas  CT angiography chest with contrast: MPRESSION: 1. No evidence of acute pulmonary embolism or other acute vascular findings. 2. New small left to the right pleural effusions with increased central airway thickening and mucous plugging in both lower lobes, likely due to bronchitis. 3. New band like opacity in the superior segment of the right lower lobe and clustered nodularity in the lingula, likely infectious/inflammatory. Consider follow-up, as clinically warranted. 4. Other scattered pulmonary nodules bilaterally are similar  to previous CT's. 5. Aortic Atherosclerosis (ICD10-I70.0) and Emphysema (ICD10-J43.9).  IV/PRN Meds:  Maxipime  2G IV every 12 hours, Vibromycin 100mg  IV every 12 hours, NS 500ml x 1.  Problem List: from note 7.16 Assessment and Plan: * AMS (altered mental status) 02-12-2024 due to hypercapnia. Mentation improved when his PCO2 down to 71 mmHg today.   DNR (do not resuscitate)/DNI(Do Not Intubate) 02-12-2024 continue DNR/DNI status. Discussed with pt, wife and son at bedside.   Hospice care patient 02-12-2024 pt continues with hospice care at home.   Acute on chronic respiratory failure with hypoxia and hypercapnia (HCC) 02-12-2024 admitted to acute on chronic hypercapnia and CO2 narcosis.  Tolerated bipap. Repeat VBG showed pH 7.38, PCO2 of 71. He probably lives with his PCO2 around high 70s all the time. Stressed importance of keeping his O2 sats 88-92%. His hypoxia is what drives his ventilation. Discussed with wife and son that if pt keeps his O2 sats >93%, he could stop breathing. Wife not sure if pt has cpap or bipap. She thinks cpap. Pt thinks he has bipap. Either way, he does not wear if because he does not like the way the mask fits on his face. Discussed with hospice rep. They will try and get pt nasal pillow and a full face mask to see if either increases his compliance. Pt has tried a mask that just covers his nose/mouth and only his mouth. Both of which he says are very uncomfortable for him.  He is stable for DC. Family and pt wants him to go home today. Continue to f/u with hospice care.   Chronic respiratory failure with hypoxia and hypercapnia (HCC) - baseline O2 requirement 4-6 L/min.  keep O2 sats 88-92%. baseline  PCO2 high 70s-low 80s mmHg 02-12-2024 He probably lives with his PCO2 around high 70s all the time. Stressed importance of keeping his O2 sats 88-92%. His hypoxia is what drives his ventilation. Discussed with wife and son that if pt keeps his O2 sats >93%, he could  stop breathing.    Discharge Planning: Ongoing  Family Contact: Communicated with spouse and son at bedside  IDT: Updated  Goals of Care: DNR  If patient requires transport, please use [facility] as we contract with them for services.   Elouise Husband BSN, RN, [facility] H[facility]ice hospital liaison [phone]

## 2024-02-12 NOTE — Discharge Planning (Signed)
 Transition of Care [facility]) - Inpatient Brief Assessment   Patient Details  Name: [name] [name]. MRN: [id number] Date of Birth: 07-03-1941  Transition of [facility]) CM/SW Contact:    Debarah Saunas, RN Phone Number: 02/12/2024, 4:00 PM   Clinical Narrative: RNCM met with pt and family at bedside regarding discharge plan.  Pt plans to return home with hospice services provided by [facility].  Elouise Husband, RN aware of admission.   Transition of Care Asessment: Insurance and Status: Insurance coverage has been reviewed Patient has primary care physician: Yes Photographer) Home environment has been reviewed: from home with spouse Prior level of function:: per spouse: moderate assist with ADLs, lots of mobility equipment, cpap Prior/Current Home Services: Current home services Photographer) Social Drivers of Health Review: SDOH reviewed no interventions necessary Readmission risk has been reviewed: Yes Transition of care needs: no transition of care needs at this time

## 2024-02-12 NOTE — Subjective & Objective (Signed)
 Pt seen and examined. Met with pt, wife, son and hospice rep at bedside. Discussed with hospice rep getting patient a full face mask and nasal pillows to try to increase his compliance with non-invasive ventilation at home. Pt and wife unclear if he has cpap vs bipap machine at home.

## 2024-02-12 NOTE — ED Notes (Signed)
Gave patient water to drink. 

## 2024-02-12 NOTE — Discharge Summary (Signed)
 [facility] Phys[facility] Discharge Summary   Patient name: [name] [name].  Admit date:     02/11/2024  Discharge date: 02/12/2024  Attending Physician: TOBIE MARIO LULLA DEDRA  Discharge Physician: Camellia Door   PCP: Pcp, No  Admitted From: Home  Disposition:  Home  Recommendations for Outpatient Follow-up:  Follow up with PCP in 1-2 weeks Follow up with hospice Patient need to change to bipap machine if CPAP ineffective in keeping patient's PCO2 level down.  Home Health:No Equipment/Devices: Oxygen 4-6 L/min. Chronically.  Keep O2 sats 88-92%. No higher than 93%  Discharge Condition:Hospice CODE STATUS:DNR/DNI Diet recommendation: Heart Healthy Fluid Restriction: None  Hospital Summary: No notes on file  Hospital Course by Problem: * AMS (altered mental status) 02-12-2024 due to hypercapnia. Mentation improved when his PCO2 down to 71 mmHg today.  DNR (do not resuscitate)/DNI(Do Not Intubate) 02-12-2024 continue DNR/DNI status. Discussed with pt, wife and son at bedside.  Hospice care patient 02-12-2024 pt continues with hospice care at home.  Acute on chronic respiratory failure with hypoxia and hypercapnia (HCC) 02-12-2024 admitted to acute on chronic hypercapnia and CO2 narcosis.  Tolerated bipap. Repeat VBG showed pH 7.38, PCO2 of 71. He probably lives with his PCO2 around high 70s all the time. Stressed importance of keeping his O2 sats 88-92%. His hypoxia is what drives his ventilation. Discussed with wife and son that if pt keeps his O2 sats >93%, he could stop breathing. Wife not sure if pt has cpap or bipap. She thinks cpap. Pt thinks he has bipap. Either way, he does not wear if because he does not like the way the mask fits on his face. Discussed with hospice rep. They will try and get pt nasal pillow and a full face mask to see if either increases his compliance. Pt has tried a mask that just covers his nose/mouth and only his mouth. Both of which he says  are very uncomfortable for him.  He is stable for DC. Family and pt wants him to go home today. Continue to f/u with hospice care.  Chronic respiratory failure with hypoxia and hypercapnia (HCC) - baseline O2 requirement 4-6 L/min.  keep O2 sats 88-92%. baseline PCO2 high 70s-low 80s mmHg 02-12-2024 He probably lives with his PCO2 around high 70s all the time. Stressed importance of keeping his O2 sats 88-92%. His hypoxia is what drives his ventilation. Discussed with wife and son that if pt keeps his O2 sats >93%, he could stop breathing.     Discharge Diagnoses:  Principal Problem:   AMS (altered mental status) Active Problems:   Chronic respiratory failure with hypoxia and hypercapnia (HCC) - baseline O2 requirement 4-6 L/min.  keep O2 sats 88-92%. baseline PCO2 high 70s-low 80s mmHg   Acute on chronic respiratory failure with hypoxia and hypercapnia (HCC)   Hospice care patient   DNR (do not resuscitate)/DNI(Do Not Intubate)   Discharge Instructions  Discharge Instructions     Call MD for:  difficulty breathing, headache or visual disturbances   Complete by: As directed    Call MD for:  extreme fatigue   Complete by: As directed    Call MD for:  hives   Complete by: As directed    Call MD for:  persistant dizziness or light-headedness   Complete by: As directed    Call MD for:  persistant nausea and vomiting   Complete by: As directed    Call MD for:  redness, tenderness, or signs of infection (  pain, swelling, redness, odor or green/yellow discharge around incision site)   Complete by: As directed    Call MD for:  severe uncontrolled pain   Complete by: As directed    Call MD for:  temperature >100.4   Complete by: As directed    Diet - low sodium heart healthy   Complete by: As directed    Discharge instructions   Complete by: As directed    1. Follow up with your primary care provider in 1-2 weeks following discharge from hospital. 2. Do not let your oxygen saturations  get above 93%. Keep them between 88-92%. Adjust your oxygen supply as needed. Will be somewhere between 3-6 Liters/min 3. Follow up with hospice to find a CPAP mask that fits. You may need to change to a bipap machine.   Increase activity slowly   Complete by: As directed       Allergies as of 02/12/2024   No Known Allergies      Medication List     TAKE these medications    clonazePAM  0.5 MG tablet Commonly known as: KLONOPIN  Take 0.5 tablets by mouth in the morning, at noon, in the evening, and at bedtime.   diltiazem  120 MG 24 hr capsule Commonly known as: CARDIZEM  CD Take 120 mg by mouth 2 (two) times daily.   dofetilide  250 MCG capsule Commonly known as: TIKOSYN  Take 250 mcg by mouth 2 (two) times daily.   Eliquis  5 MG Tabs tablet Generic drug: apixaban  Take 5 mg by mouth 2 (two) times daily.   finasteride  5 MG tablet Commonly known as: PROSCAR  Take 5 mg by mouth in the morning.   gabapentin  100 MG capsule Commonly known as: NEURONTIN  Take 200 mg by mouth at bedtime.   guaiFENesin 600 MG 12 hr tablet Commonly known as: MUCINEX Take 600 mg by mouth in the morning.   ipratropium-albuterol  0.5-2.5 (3) MG/3ML Soln Commonly known as: DUONEB Take 3 mLs by nebulization See admin instructions. Inhale 3mL by nebulization twice daily. May use extra if needed.   morphine  CONCENTRATE 10 mg / 0.5 ml concentrated solution Take 5 mg by mouth as needed for moderate pain (pain score 4-6) or severe pain (pain score 7-10).   ONE A DAY MEN 50 PLUS PO Take 1 tablet by mouth in the morning.   pantoprazole  40 MG tablet Commonly known as: PROTONIX  Take 40 mg by mouth in the morning.   rosuvastatin  20 MG tablet Commonly known as: CRESTOR  Take 20 mg by mouth daily.   ZyrTEC Allergy 10 MG tablet Generic drug: cetirizine Take 10 mg by mouth in the morning.        No Known Allergies  Discharge Exam: Vitals:   02/12/24 1330 02/12/24 1346  BP: (!) 114/94   Pulse:     Resp: (!) 23 (!) 21  Temp:  98.1 F (36.7 C)  SpO2:      Physical Exam Vitals and nursing note reviewed.  Constitutional:      General: He is not in acute distress.    Comments: Appears chronically ill  HENT:     Head: Normocephalic and atraumatic.  Cardiovascular:     Rate and Rhythm: Normal rate.  Pulmonary:     Effort: No respiratory distress.     Comments: Scattered wheezing. Coarse BS. Limited air exchange. No respiratory distress Abdominal:     General: Abdomen is flat. Bowel sounds are normal.  Musculoskeletal:     Right lower leg: No edema.  Left lower leg: No edema.  Skin:    General: Skin is warm and dry.     Capillary Refill: Capillary refill takes less than 2 seconds.  Neurological:     General: No focal deficit present.     Mental Status: He is alert and oriented to person, place, and time.     The results of significant diagnostics from this hospitalization (including imaging, microbiology, ancillary and laboratory) are listed below for reference.    Microbiology: Recent Results (from the past 240 hours)  Resp panel by RT-PCR (RSV, Flu A&B, Covid) Anterior Nasal Swab     Status: None   Collection Time: 02/11/24  1:57 PM   Specimen: Anterior Nasal Swab  Result Value Ref Range Status   SARS Coronavirus 2 by RT PCR NEGATIVE NEGATIVE Final   Influenza A by PCR NEGATIVE NEGATIVE Final   Influenza B by PCR NEGATIVE NEGATIVE Final    Comment: (NOTE) The Xpert Xpress SARS-CoV-2/FLU/RSV plus assay is intended as an aid in the diagnosis of influenza from Nasopharyngeal swab specimens and should not be used as a sole basis for treatment. Nasal washings and aspirates are unacceptable for Xpert Xpress SARS-CoV-2/FLU/RSV testing.  Fact Sheet for Patients: BloggerCourse.com  Fact Sheet for Healthcare Providers: SeriousBroker.it  This test is not yet approved or cleared by the [place]  FDA and has been  authorized for detection and/or diagnosis of SARS-CoV-2 by FDA under an Emergency Use Authorization (EUA). This EUA will remain in effect (meaning this test can be used) for the duration of the COVID-19 declaration under Section 564(b)(1) of the Act, 21 U.S.C. section 360bbb-3(b)(1), unless the authorization is terminated or revoked.     Resp Syncytial Virus by PCR NEGATIVE NEGATIVE Final    Comment: (NOTE) Fact Sheet for Patients: BloggerCourse.com  Fact Sheet for Healthcare Providers: SeriousBroker.it  This test is not yet approved or cleared by the [place]  FDA and has been authorized for detection and/or diagnosis of SARS-CoV-2 by FDA under an Emergency Use Authorization (EUA). This EUA will remain in effect (meaning this test can be used) for the duration of the COVID-19 declaration under Section 564(b)(1) of the Act, 21 U.S.C. section 360bbb-3(b)(1), unless the authorization is terminated or revoked.  Performed at [facility], [address], [place], [place] [address]   Culture, blood (routine x 2)     Status: None (Preliminary result)   Collection Time: 02/11/24  2:04 PM   Specimen: BLOOD  Result Value Ref Range Status   Specimen Description BLOOD LEFT ANTECUBITAL  Final   Special Requests   Final    BOTTLES DRAWN AEROBIC AND ANAEROBIC Blood Culture adequate volume   Culture   Final    NO GROWTH < 24 HOURS Performed at [facility], [address], [place], [place] [address]    Report Status PENDING  Incomplete  Culture, blood (routine x 2)     Status: None (Preliminary result)   Collection Time: 02/11/24  2:14 PM   Specimen: BLOOD LEFT WRIST  Result Value Ref Range Status   Specimen Description BLOOD LEFT WRIST  Final   Special Requests   Final    BOTTLES DRAWN AEROBIC AND ANAEROBIC Blood Culture adequate volume   Culture   Final    NO GROWTH < 24 HOURS Performed at [facility],  [address], [place], [place] [address]    Report Status PENDING  Incomplete     Labs: BNP (last 3 results)  Recent Labs    02/11/24 1404  BNP 187.3*   Basic Metabolic Panel: Recent Labs  Lab 02/11/24 1404 02/11/24 1409 02/11/24 1820 02/12/24 0556  NA 140 137  --  142  K 5.1 5.1  --  4.5  CL 88*  --   --  91*  CO2 42*  --   --  39*  GLUCOSE 187*  --   --  91  BUN 23  --   --  19  CREATININE 1.27*  --   --  1.06  CALCIUM  9.0  --   --  8.8*  MG  --   --  2.0  --    Liver Function Tests: Recent Labs  Lab 02/12/24 0556  AST 21  ALT 14  ALKPHOS 53  BILITOT 0.7  PROT 5.5*  ALBUMIN 2.6*   CBC: Recent Labs  Lab 02/11/24 1404 02/11/24 1409 02/12/24 0556  WBC 11.1*  --  11.8*  NEUTROABS 8.6*  --   --   HGB 11.1* 12.2* 9.3*  HCT 38.1* 36.0* 31.2*  MCV 95.3  --  94.8  PLT 364  --  294   Cardiac Enzymes: Recent Labs  Lab 02/11/24 1800  CKTOTAL 47*   BNP: Recent Labs  Lab 02/11/24 1404  BNP 187.3*   CBG: Recent Labs  Lab 02/11/24 1351  GLUCAP 185*   Urinalysis    Component Value Date/Time   COLORURINE YELLOW 02/12/2024 0410   APPEARANCEUR CLEAR 02/12/2024 0410   LABSPEC >1.046 (H) 02/12/2024 0410   PHURINE 5.0 02/12/2024 0410   GLUCOSEU NEGATIVE 02/12/2024 0410   HGBUR NEGATIVE 02/12/2024 0410   BILIRUBINUR NEGATIVE 02/12/2024 0410   KETONESUR NEGATIVE 02/12/2024 0410   PROTEINUR 30 (A) 02/12/2024 0410   NITRITE NEGATIVE 02/12/2024 0410   LEUKOCYTESUR NEGATIVE 02/12/2024 0410   Sepsis Labs Recent Labs  Lab 02/11/24 1404 02/12/24 0556  WBC 11.1* 11.8*    Venous Blood Gas: Recent Labs    02/11/24 1409 02/12/24 1245  PHVEN 7.248* 7.38  PCO2VEN 109.9* 71*  PO2VEN 45 36  HCO3 47.9* 42.6*  O2SAT 68 65.9     Procedures/Studies: CT HEAD WO CONTRAST ( ) Result Date: 02/12/2024 CLINICAL DATA:  Mental status change. 8 HOURS FROM FIRST STUDY PLEASE RETIME. EXAM: CT HEAD WITHOUT CONTRAST TECHNIQUE: Contiguous axial images were obtained  from the base of the skull through the vertex without intravenous contrast. RADIATION DOSE REDUCTION: This exam was performed according to the departmental dose-optimization program which includes automated exposure control, adjustment of the mA and/or kV according to patient size and/or use of iterative reconstruction technique. COMPARISON:  CT head 02/11/2024. FINDINGS: Brain: No change in possible trace bilateral subdural fluid collections. No convincing subarachnoid hemorrhage. No evidence of acute infarction, hydrocephalus, extra-axial collection or mass lesion/mass effect. Patchy white matter hypodensities are nonspecific but compatible with chronic microvascular ischemic change. Vascular: No hyperdense vessel identified. Skull: No acute fracture. Sinuses/Orbits: Mostly clear sinuses.  No acute orbital findings. IMPRESSION: 1. No change in possible trace low density bilateral subdural hematomas. No mass effect. 2. No convincing subarachnoid hemorrhage. 3. No new/interval acute abnormality. Electronically Signed   By: Gilmore GORMAN Molt M.D.   On: 02/12/2024 02:21   CT Head Wo Contrast Result Date: 02/11/2024 EXAM: CT HEAD WITHOUT CONTRAST 02/11/2024 06:06:00 PM TECHNIQUE: CT of the head was performed without the administration of intravenous contrast. Automated exposure control, iterative reconstruction, and/or weight based adjustment of the mA/kV was utilized to reduce the radiation dose to as low  as reasonably achievable. COMPARISON: None available. CLINICAL HISTORY: Mental status change, unknown cause. FINDINGS: BRAIN AND VENTRICLES: Generalized cerebral atrophy. Advanced chronic small vessel ischemic disease. Suspected small bilateral subdural hematomas measuring 4 mm in radial dimension on the left and 3 mm on the right (see series 5, image 46). Minimal, if any, mass effect. No midline shift. Hyperdensity deep to the subdural hematomas may be artifactual or represent a small amount of subarachnoid  hemorrhage. ORBITS: No acute abnormality. SINUSES: No acute abnormality. SOFT TISSUES AND SKULL: No acute soft tissue abnormality. No skull fracture. IMPRESSION: 1. Suspected small bilateral subdural hematomas (4 mm left, 3 mm right) with minimal if any mass effect and no midline shift. Associated deep hyperdensity may be artifactual or represent a small amount of subarachnoid hemorrhage. 2. Generalized cerebral atrophy and advanced chronic small vessel ischemic disease. Critical results reported by telephone at 7:00 pm onJul 15 2025 to Dr. Tobie who verbalized acknowledged these results Electronically signed by: Norman Gatlin MD 02/11/2024 07:02 PM EDT RP Workstation: [id number]   CT Angio Chest PE W and/or Wo Contrast Result Date: 02/11/2024 CLINICAL DATA:  Pulmonary embolism (PE) suspected, high prob EXAM: CT ANGIOGRAPHY CHEST WITH CONTRAST TECHNIQUE: Multidetector CT imaging of the chest was performed using the standard protocol during bolus administration of intravenous contrast. Multiplanar CT image reconstructions and MIPs were obtained to evaluate the vascular anatomy. RADIATION DOSE REDUCTION: This exam was performed according to the departmental dose-optimization program which includes automated exposure control, adjustment of the mA and/or kV according to patient size and/or use of iterative reconstruction technique. CONTRAST:  75mL OMNIPAQUE  IOHEXOL  350 MG/ML SOLN COMPARISON:  Chest radiograph same date. Chest CTA 05/31/2023. Chest CT 11/07/2022. FINDINGS: Cardiovascular: The pulmonary arteries are well opacified with contrast to the level of the segmental branches. There is no evidence of acute pulmonary embolism. Diffuse atherosclerosis of the aorta, great vessels and coronary arteries. No acute systemic arterial abnormalities are identified. There are calcifications of the aortic valve and lipomatous hypertrophy of the interatrial septum. The heart size is normal. There is no pericardial effusion.  Mediastinum/Nodes: Similar mildly prominent hilar and subcarinal lymph nodes, likely reactive. No progressive thoracic adenopathy. The thyroid gland, trachea and esophagus demonstrate no significant findings. Lungs/Pleura: New small left to the right pleural effusions with increased central airway thickening and mucous plugging in both lower lobes. Moderate centrilobular and paraseptal emphysema. New band like opacity in the superior segment of the right lower lobe measuring up to 1.6 cm transverse on image 84/6. There is also new clustered nodularity within the lingula (image 107/6). Other scattered pulmonary nodules bilaterally are similar to previous CTs, including a solid left lower lobe nodule measuring 1.1 x 0.9 cm on image 96/6. Upper abdomen: The visualized upper abdomen appears stable, without acute or significant findings. Aortic and branch vessel atherosclerosis. Musculoskeletal/Chest wall: There is no chest wall mass or suspicious osseous finding. 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 Review of the MIP images confirms the above findings. IMPRESSION: 1. No evidence of acute pulmonary embolism or other acute vascular findings. 2. New small left to the right pleural effusions with increased central airway thickening and mucous plugging in both lower lobes, likely due to bronchitis. 3. New band like opacity in the superior segment of the right lower lobe and clustered nodularity in the lingula, likely infectious/inflammatory. Consider follow-up, as clinically warranted. 4. Other scattered pulmonary nodules bilaterally are similar to previous CT's. 5. Aortic Atherosclerosis (ICD10-I70.0) and Emphysema (ICD10-J43.9). Electronically Signed  By: Elsie Perone M.D.   On: 02/11/2024 18:36   DG Chest Port 1 View Result Date: 02/11/2024 CLINICAL DATA:  Shortness of breath EXAM: PORTABLE CHEST 1 VIEW COMPARISON:  X-ray 10/21/2023 and older FINDINGS: Hyperinflation.  Chronic lung changes. No consolidation, pneumothorax or effusion. No edema. Normal cardiopericardial silhouette. Degenerative changes along the spine. Overlapping cardiac leads. IMPRESSION: Hyperinflation with chronic changes. Electronically Signed   By: Ranell Bring M.D.   On: 02/11/2024 14:43    Time coordinating discharge: 55 mins  SIGNED:  Camellia Door, DO [facility] 02/12/24, 1:58 PM

## 2024-02-13 DIAGNOSIS — Z515 Encounter for palliative care: Secondary | ICD-10-CM | POA: Diagnosis not present

## 2024-02-13 DIAGNOSIS — Z66 Do not resuscitate: Secondary | ICD-10-CM | POA: Diagnosis not present

## 2024-02-13 DIAGNOSIS — J9622 Acute and chronic respiratory failure with hypercapnia: Secondary | ICD-10-CM | POA: Diagnosis not present

## 2024-02-13 DIAGNOSIS — J449 Chronic obstructive pulmonary disease, unspecified: Secondary | ICD-10-CM

## 2024-02-13 DIAGNOSIS — E43 Unspecified severe protein-calorie malnutrition: Secondary | ICD-10-CM | POA: Insufficient documentation

## 2024-02-13 DIAGNOSIS — J9612 Chronic respiratory failure with hypercapnia: Secondary | ICD-10-CM | POA: Diagnosis not present

## 2024-02-13 DIAGNOSIS — R400 Somnolence: Secondary | ICD-10-CM | POA: Diagnosis not present

## 2024-02-13 DIAGNOSIS — J9611 Chronic respiratory failure with hypoxia: Secondary | ICD-10-CM | POA: Diagnosis not present

## 2024-02-13 DIAGNOSIS — J9621 Acute and chronic respiratory failure with hypoxia: Secondary | ICD-10-CM | POA: Diagnosis not present

## 2024-02-13 LAB — BLOOD GAS, VENOUS
Acid-Base Excess: 17 mmol/L — ABNORMAL HIGH (ref 0.0–2.0)
Bicarbonate: 45.8 mmol/L — ABNORMAL HIGH (ref 20.0–28.0)
Drawn by: 64724
O2 Saturation: 72 %
Patient temperature: 36.8
pCO2, Ven: 73 mmHg (ref 44–60)
pH, Ven: 7.4 (ref 7.25–7.43)
pO2, Ven: 43 mmHg (ref 32–45)

## 2024-02-13 NOTE — Discharge Summary (Addendum)
 [facility] Phys[facility] Discharge Summary   Patient name: [name] [name].  Admit date:     02/11/2024  Discharge date: 02/13/2024  Attending Physician: LAURENCE Jemar Paulsen [[id number]]  Discharge Physician: Camellia LAURENCE   PCP: Pcp, No  Admitted From: Home  Disposition:  Home  Recommendations for Outpatient Follow-up:  Follow up with PCP in 1-2 weeks Follow up with hospice  Home Health:No Equipment/Devices: Liquid O2. Family to call [employer] to get liquid O2 for home use and portable liquid O2 for office visits.  Discharge Condition:Stable CODE STATUS:DNR/DNI Diet recommendation: Heart Healthy Fluid Restriction: None  Hospital Summary: No notes on file  Hospital Course by Problem: * AMS (altered mental status) 02-12-2024 due to hypercapnia. Mentation improved when his PCO2 down to 71 mmHg today.  02-13-2024 due to hypercapnic respiratory failure. Now resolved.  Acute on chronic respiratory failure with hypoxia and hypercapnia (HCC) 02-12-2024 admitted to acute on chronic hypercapnia and CO2 narcosis.  Tolerated bipap. Repeat VBG showed pH 7.38, PCO2 of 71. He probably lives with his PCO2 around high 70s all the time. Stressed importance of keeping his O2 sats 88-92%. His hypoxia is what drives his ventilation. Discussed with wife and son that if pt keeps his O2 sats >93%, he could stop breathing. Wife not sure if pt has cpap or bipap. She thinks cpap. Pt thinks he has bipap. Either way, he does not wear if because he does not like the way the mask fits on his face. Discussed with hospice rep. They will try and get pt nasal pillow and a full face mask to see if either increases his compliance. Pt has tried a mask that just covers his nose/mouth and only his mouth. Both of which he says are very uncomfortable for him.  He is stable for DC. Family and pt wants him to go home today. Continue to f/u with hospice care.  02-13-2024 Confirmed with hospice rep that pt has Bipap on his OSA machine at  home. Repeat VBG this AM after he was on bipap most of the night shows VBG pH of 7.4, PCO2 of 73. Consistent with chronic hypercapnic respiratory failure. [employer] has brought liquid O2 tank for him to go home on.   End stage COPD (HCC) 02-13-2024 pt has end stage COPD and is active patient with hospice.  Chronic respiratory failure with hypoxia and hypercapnia (HCC) - baseline O2 requirement 4-6 L/min.  keep O2 sats 88-92%. baseline PCO2 low 70s mmHg 02-12-2024 He probably lives with his PCO2 around high 70s all the time. Stressed importance of keeping his O2 sats 88-92%. His hypoxia is what drives his ventilation. Discussed with wife and son that if pt keeps his O2 sats >93%, he could stop breathing.   02-13-2024 baseline PCO2 of low 70s.  Keep O2 sats between 88-92%. On 3-6 liters per min of O2.  DNR (do not resuscitate)/DNI(Do Not Intubate) 02-12-2024 continue DNR/DNI status. Discussed with pt, wife and son at bedside.  02-13-2024 continue with DNR/DNI status.    Hospice care patient 02-12-2024 pt continues with hospice care at home.  02-13-2024 f.u with hospice  Protein-calorie malnutrition, severe Nutrition Status: Nutrition Problem: Severe Malnutrition Etiology: chronic illness, decreased appetite Signs/Symptoms: severe muscle depletion, severe fat depletion, energy intake < 75% for > or equal to 3 months Interventions: Refer to RD note for recommendations      Discharge Diagnoses:  Principal Problem:   AMS (altered mental status) Active Problems:   Acute on chronic respiratory failure with hypoxia  and hypercapnia (HCC)   Chronic respiratory failure with hypoxia and hypercapnia (HCC) - baseline O2 requirement 4-6 L/min.  keep O2 sats 88-92%. baseline PCO2 low 70s mmHg   End stage COPD (HCC)   Hospice care patient   DNR (do not resuscitate)/DNI(Do Not Intubate)   Protein-calorie malnutrition, severe   Discharge Instructions  Discharge Instructions     Call MD for:   difficulty breathing, headache or visual disturbances   Complete by: As directed    Call MD for:  extreme fatigue   Complete by: As directed    Call MD for:  hives   Complete by: As directed    Call MD for:  persistant dizziness or light-headedness   Complete by: As directed    Call MD for:  persistant nausea and vomiting   Complete by: As directed    Call MD for:  redness, tenderness, or signs of infection (pain, swelling, redness, odor or green/yellow discharge around incision site)   Complete by: As directed    Call MD for:  severe uncontrolled pain   Complete by: As directed    Call MD for:  temperature >100.4   Complete by: As directed    Diet - low sodium heart healthy   Complete by: As directed    Discharge instructions   Complete by: As directed    1. Follow up with your primary care provider in 1-2 weeks following discharge from hospital. 2. Do not let your oxygen saturations get above 93%. Keep them between 88-92%. Adjust your oxygen supply as needed. Will be somewhere between 3-6 Liters/min 3. Follow up with hospice to find a CPAP mask that fits. You may need to change to a bipap machine.   Increase activity slowly   Complete by: As directed       Allergies as of 02/13/2024   No Known Allergies      Medication List     TAKE these medications    clonazePAM  0.5 MG tablet Commonly known as: KLONOPIN  Take 0.5 tablets by mouth in the morning, at noon, in the evening, and at bedtime.   diltiazem  120 MG 24 hr capsule Commonly known as: CARDIZEM  CD Take 120 mg by mouth 2 (two) times daily.   dofetilide  250 MCG capsule Commonly known as: TIKOSYN  Take 250 mcg by mouth 2 (two) times daily.   Eliquis  5 MG Tabs tablet Generic drug: apixaban  Take 5 mg by mouth 2 (two) times daily.   finasteride  5 MG tablet Commonly known as: PROSCAR  Take 5 mg by mouth in the morning.   gabapentin  100 MG capsule Commonly known as: NEURONTIN  Take 200 mg by mouth at bedtime.    guaiFENesin 600 MG 12 hr tablet Commonly known as: MUCINEX Take 600 mg by mouth in the morning.   ipratropium-albuterol  0.5-2.5 (3) MG/3ML Soln Commonly known as: DUONEB Take 3 mLs by nebulization See admin instructions. Inhale 3mL by nebulization twice daily. May use extra if needed.   morphine  CONCENTRATE 10 mg / 0.5 ml concentrated solution Take 5 mg by mouth as needed for moderate pain (pain score 4-6) or severe pain (pain score 7-10).   ONE A DAY MEN 50 PLUS PO Take 1 tablet by mouth in the morning.   pantoprazole  40 MG tablet Commonly known as: PROTONIX  Take 40 mg by mouth in the morning.   rosuvastatin  20 MG tablet Commonly known as: CRESTOR  Take 20 mg by mouth daily.   ZyrTEC Allergy 10 MG tablet Generic drug: cetirizine Take  10 mg by mouth in the morning.        No Known Allergies  Discharge Exam: Vitals:   02/13/24 1400 02/13/24 1425  BP:    Pulse:  97  Resp: (!) 32 16  Temp:    SpO2:  94%    Physical Exam Vitals and nursing note reviewed.  Constitutional:      General: He is not in acute distress.    Appearance: He is not toxic-appearing or diaphoretic.     Comments: Chronically ill appearing  HENT:     Head: Normocephalic and atraumatic.     Nose: Nose normal.  Eyes:     General: No scleral icterus. Cardiovascular:     Rate and Rhythm: Normal rate and regular rhythm.  Pulmonary:     Effort: Pulmonary effort is normal.  Abdominal:     General: Bowel sounds are normal.     Palpations: Abdomen is soft.  Musculoskeletal:     Right lower leg: No edema.     Left lower leg: No edema.  Skin:    General: Skin is warm and dry.     Capillary Refill: Capillary refill takes less than 2 seconds.  Neurological:     Mental Status: He is alert and oriented to person, place, and time.     The results of significant diagnostics from this hospitalization (including imaging, microbiology, ancillary and laboratory) are listed below for reference.     Microbiology: Recent Results (from the past 240 hours)  Resp panel by RT-PCR (RSV, Flu A&B, Covid) Anterior Nasal Swab     Status: None   Collection Time: 02/11/24  1:57 PM   Specimen: Anterior Nasal Swab  Result Value Ref Range Status   SARS Coronavirus 2 by RT PCR NEGATIVE NEGATIVE Final   Influenza A by PCR NEGATIVE NEGATIVE Final   Influenza B by PCR NEGATIVE NEGATIVE Final    Comment: (NOTE) The Xpert Xpress SARS-CoV-2/FLU/RSV plus assay is intended as an aid in the diagnosis of influenza from Nasopharyngeal swab specimens and should not be used as a sole basis for treatment. Nasal washings and aspirates are unacceptable for Xpert Xpress SARS-CoV-2/FLU/RSV testing.  Fact Sheet for Patients: BloggerCourse.com  Fact Sheet for Healthcare Providers: SeriousBroker.it  This test is not yet approved or cleared by the [place]  FDA and has been authorized for detection and/or diagnosis of SARS-CoV-2 by FDA under an Emergency Use Authorization (EUA). This EUA will remain in effect (meaning this test can be used) for the duration of the COVID-19 declaration under Section 564(b)(1) of the Act, 21 U.S.C. section 360bbb-3(b)(1), unless the authorization is terminated or revoked.     Resp Syncytial Virus by PCR NEGATIVE NEGATIVE Final    Comment: (NOTE) Fact Sheet for Patients: BloggerCourse.com  Fact Sheet for Healthcare Providers: SeriousBroker.it  This test is not yet approved or cleared by the [place]  FDA and has been authorized for detection and/or diagnosis of SARS-CoV-2 by FDA under an Emergency Use Authorization (EUA). This EUA will remain in effect (meaning this test can be used) for the duration of the COVID-19 declaration under Section 564(b)(1) of the Act, 21 U.S.C. section 360bbb-3(b)(1), unless the authorization is terminated or revoked.  Performed at  [facility], [address] [place], [place] [address]   Culture, blood (routine x 2)     Status: None (Preliminary result)   Collection Time: 02/11/24  2:04 PM   Specimen: BLOOD  Result Value Ref Range Status   Specimen  Description BLOOD LEFT ANTECUBITAL  Final   Special Requests   Final    BOTTLES DRAWN AEROBIC AND ANAEROBIC Blood Culture adequate volume   Culture   Final    NO GROWTH 2 DAYS Performed at [facility], [address], [place], [place] [address]    Report Status PENDING  Incomplete  Culture, blood (routine x 2)     Status: None (Preliminary result)   Collection Time: 02/11/24  2:14 PM   Specimen: BLOOD LEFT WRIST  Result Value Ref Range Status   Specimen Description BLOOD LEFT WRIST  Final   Special Requests   Final    BOTTLES DRAWN AEROBIC AND ANAEROBIC Blood Culture adequate volume   Culture   Final    NO GROWTH 2 DAYS Performed at [facility], [address], [place], [place] [address]    Report Status PENDING  Incomplete     Labs: BNP (last 3 results) Recent Labs    02/11/24 1404  BNP 187.3*   Basic Metabolic Panel: Recent Labs  Lab 02/11/24 1404 02/11/24 1409 02/11/24 1820 02/12/24 0556  NA 140 137  --  142  K 5.1 5.1  --  4.5  CL 88*  --   --  91*  CO2 42*  --   --  39*  GLUCOSE 187*  --   --  91  BUN 23  --   --  19  CREATININE 1.27*  --   --  1.06  CALCIUM  9.0  --   --  8.8*  MG  --   --  2.0  --    Liver Function Tests: Recent Labs  Lab 02/12/24 0556  AST 21  ALT 14  ALKPHOS 53  BILITOT 0.7  PROT 5.5*  ALBUMIN 2.6*   CBC: Recent Labs  Lab 02/11/24 1404 02/11/24 1409 02/12/24 0556  WBC 11.1*  --  11.8*  NEUTROABS 8.6*  --   --   HGB 11.1* 12.2* 9.3*  HCT 38.1* 36.0* 31.2*  MCV 95.3  --  94.8  PLT 364  --  294   Cardiac Enzymes: Recent Labs  Lab 02/11/24 1800  CKTOTAL 47*   BNP: Recent Labs  Lab 02/11/24 1404  BNP 187.3*   CBG: Recent Labs  Lab 02/11/24 1351 02/12/24 1731  GLUCAP  185* 99   Urinalysis    Component Value Date/Time   COLORURINE YELLOW 02/12/2024 0410   APPEARANCEUR CLEAR 02/12/2024 0410   LABSPEC >1.046 (H) 02/12/2024 0410   PHURINE 5.0 02/12/2024 0410   GLUCOSEU NEGATIVE 02/12/2024 0410   HGBUR NEGATIVE 02/12/2024 0410   BILIRUBINUR NEGATIVE 02/12/2024 0410   KETONESUR NEGATIVE 02/12/2024 0410   PROTEINUR 30 (A) 02/12/2024 0410   NITRITE NEGATIVE 02/12/2024 0410   LEUKOCYTESUR NEGATIVE 02/12/2024 0410   Sepsis Labs Recent Labs  Lab 02/11/24 1404 02/12/24 0556  WBC 11.1* 11.8*    Procedures/Studies: VAS US  LOWER EXTREMITY VENOUS (DVT) Result Date: 02/12/2024  Lower Venous DVT Study Patient Name:  [name] [name].  Date of Exam:   02/12/2024 Medical Rec #: [id number]             Accession #:    [id number] Date of Birth: [birth year]             Patient Gender: M Patient Age:   24 years Exam Location:  [facility] Procedure:      VAS US  LOWER EXTREMITY VENOUS (DVT) Referring Phys: EKTA PATEL --------------------------------------------------------------------------------  Indications: Pain,  in lower legs, Swelling, Edema, and SOB.  Comparison Study: No priors. Performing Technologist: Ricka Sturdivant-Jones RDMS, RVT  Examination Guidelines: A complete evaluation includes B-mode imaging, spectral Doppler, color Doppler, and power Doppler as needed of all accessible portions of each vessel. Bilateral testing is considered an integral part of a complete examination. Limited examinations for reoccurring indications may be performed as noted. The reflux portion of the exam is performed with the patient in reverse Trendelenburg.  +---------+---------------+---------+-----------+----------+--------------+ RIGHT    CompressibilityPhasicitySpontaneityPropertiesThrombus Aging +---------+---------------+---------+-----------+----------+--------------+ CFV      Full           Yes      Yes                                  +---------+---------------+---------+-----------+----------+--------------+ SFJ      Full                                                        +---------+---------------+---------+-----------+----------+--------------+ FV Prox  Full                                                        +---------+---------------+---------+-----------+----------+--------------+ FV Mid   Full                                                        +---------+---------------+---------+-----------+----------+--------------+ FV DistalFull                                                        +---------+---------------+---------+-----------+----------+--------------+ PFV      Full                                                        +---------+---------------+---------+-----------+----------+--------------+ POP      Full           Yes      Yes                                 +---------+---------------+---------+-----------+----------+--------------+ PTV      Full                                                        +---------+---------------+---------+-----------+----------+--------------+ PERO     Full                                                        +---------+---------------+---------+-----------+----------+--------------+   +---------+---------------+---------+-----------+----------+--------------+  LEFT     CompressibilityPhasicitySpontaneityPropertiesThrombus Aging +---------+---------------+---------+-----------+----------+--------------+ CFV      Full           Yes      Yes                                 +---------+---------------+---------+-----------+----------+--------------+ SFJ      Full                                                        +---------+---------------+---------+-----------+----------+--------------+ FV Prox  Full                                                         +---------+---------------+---------+-----------+----------+--------------+ FV Mid   Full                                                        +---------+---------------+---------+-----------+----------+--------------+ FV DistalFull                                                        +---------+---------------+---------+-----------+----------+--------------+ PFV      Full                                                        +---------+---------------+---------+-----------+----------+--------------+ POP      Full           Yes      Yes                                 +---------+---------------+---------+-----------+----------+--------------+ PTV      Full                                                        +---------+---------------+---------+-----------+----------+--------------+ PERO     Full                                                        +---------+---------------+---------+-----------+----------+--------------+ Incidental finding - occluded left SFA.    Summary: RIGHT: - There is no evidence of deep vein thrombosis in the lower extremity.  - No cystic structure found in the popliteal fossa.  LEFT: - There is no evidence of deep vein thrombosis in the  lower extremity.  - A cystic structure is found in the popliteal fossa. - Occluded left SFA was noted.  *See table(s) above for measurements and observations.    Preliminary    CT HEAD WO CONTRAST ( ) Result Date: 02/12/2024 CLINICAL DATA:  Mental status change. 8 HOURS FROM FIRST STUDY PLEASE RETIME. EXAM: CT HEAD WITHOUT CONTRAST TECHNIQUE: Contiguous axial images were obtained from the base of the skull through the vertex without intravenous contrast. RADIATION DOSE REDUCTION: This exam was performed according to the departmental dose-optimization program which includes automated exposure control, adjustment of the mA and/or kV according to patient size and/or use of iterative reconstruction  technique. COMPARISON:  CT head 02/11/2024. FINDINGS: Brain: No change in possible trace bilateral subdural fluid collections. No convincing subarachnoid hemorrhage. No evidence of acute infarction, hydrocephalus, extra-axial collection or mass lesion/mass effect. Patchy white matter hypodensities are nonspecific but compatible with chronic microvascular ischemic change. Vascular: No hyperdense vessel identified. Skull: No acute fracture. Sinuses/Orbits: Mostly clear sinuses.  No acute orbital findings. IMPRESSION: 1. No change in possible trace low density bilateral subdural hematomas. No mass effect. 2. No convincing subarachnoid hemorrhage. 3. No new/interval acute abnormality. Electronically Signed   By: Gilmore GORMAN Molt M.D.   On: 02/12/2024 02:21   CT Head Wo Contrast Result Date: 02/11/2024 EXAM: CT HEAD WITHOUT CONTRAST 02/11/2024 06:06:00 PM TECHNIQUE: CT of the head was performed without the administration of intravenous contrast. Automated exposure control, iterative reconstruction, and/or weight based adjustment of the mA/kV was utilized to reduce the radiation dose to as low as reasonably achievable. COMPARISON: None available. CLINICAL HISTORY: Mental status change, unknown cause. FINDINGS: BRAIN AND VENTRICLES: Generalized cerebral atrophy. Advanced chronic small vessel ischemic disease. Suspected small bilateral subdural hematomas measuring 4 mm in radial dimension on the left and 3 mm on the right (see series 5, image 46). Minimal, if any, mass effect. No midline shift. Hyperdensity deep to the subdural hematomas may be artifactual or represent a small amount of subarachnoid hemorrhage. ORBITS: No acute abnormality. SINUSES: No acute abnormality. SOFT TISSUES AND SKULL: No acute soft tissue abnormality. No skull fracture. IMPRESSION: 1. Suspected small bilateral subdural hematomas (4 mm left, 3 mm right) with minimal if any mass effect and no midline shift. Associated deep hyperdensity may be  artifactual or represent a small amount of subarachnoid hemorrhage. 2. Generalized cerebral atrophy and advanced chronic small vessel ischemic disease. Critical results reported by telephone at 7:00 pm onJul 15 2025 to Dr. Tobie who verbalized acknowledged these results Electronically signed by: Norman Gatlin MD 02/11/2024 07:02 PM EDT RP Workstation: [id number]   CT Angio Chest PE W and/or Wo Contrast Result Date: 02/11/2024 CLINICAL DATA:  Pulmonary embolism (PE) suspected, high prob EXAM: CT ANGIOGRAPHY CHEST WITH CONTRAST TECHNIQUE: Multidetector CT imaging of the chest was performed using the standard protocol during bolus administration of intravenous contrast. Multiplanar CT image reconstructions and MIPs were obtained to evaluate the vascular anatomy. RADIATION DOSE REDUCTION: This exam was performed according to the departmental dose-optimization program which includes automated exposure control, adjustment of the mA and/or kV according to patient size and/or use of iterative reconstruction technique. CONTRAST:  75mL OMNIPAQUE  IOHEXOL  350 MG/ML SOLN COMPARISON:  Chest radiograph same date. Chest CTA 05/31/2023. Chest CT 11/07/2022. FINDINGS: Cardiovascular: The pulmonary arteries are well opacified with contrast to the level of the segmental branches. There is no evidence of acute pulmonary embolism. Diffuse atherosclerosis of the aorta, great vessels and coronary arteries. No acute systemic arterial abnormalities are identified.  There are calcifications of the aortic valve and lipomatous hypertrophy of the interatrial septum. The heart size is normal. There is no pericardial effusion. Mediastinum/Nodes: Similar mildly prominent hilar and subcarinal lymph nodes, likely reactive. No progressive thoracic adenopathy. The thyroid gland, trachea and esophagus demonstrate no significant findings. Lungs/Pleura: New small left to the right pleural effusions with increased central airway thickening and mucous  plugging in both lower lobes. Moderate centrilobular and paraseptal emphysema. New band like opacity in the superior segment of the right lower lobe measuring up to 1.6 cm transverse on image 84/6. There is also new clustered nodularity within the lingula (image 107/6). Other scattered pulmonary nodules bilaterally are similar to previous CTs, including a solid left lower lobe nodule measuring 1.1 x 0.9 cm on image 96/6. Upper abdomen: The visualized upper abdomen appears stable, without acute or significant findings. Aortic and branch vessel atherosclerosis. Musculoskeletal/Chest wall: There is no chest wall mass or suspicious osseous finding. 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 Review of the MIP images confirms the above findings. IMPRESSION: 1. No evidence of acute pulmonary embolism or other acute vascular findings. 2. New small left to the right pleural effusions with increased central airway thickening and mucous plugging in both lower lobes, likely due to bronchitis. 3. New band like opacity in the superior segment of the right lower lobe and clustered nodularity in the lingula, likely infectious/inflammatory. Consider follow-up, as clinically warranted. 4. Other scattered pulmonary nodules bilaterally are similar to previous CT's. 5. Aortic Atherosclerosis (ICD10-I70.0) and Emphysema (ICD10-J43.9). Electronically Signed   By: Elsie Perone M.D.   On: 02/11/2024 18:36   DG Chest Port 1 View Result Date: 02/11/2024 CLINICAL DATA:  Shortness of breath EXAM: PORTABLE CHEST 1 VIEW COMPARISON:  X-ray 10/21/2023 and older FINDINGS: Hyperinflation. Chronic lung changes. No consolidation, pneumothorax or effusion. No edema. Normal cardiopericardial silhouette. Degenerative changes along the spine. Overlapping cardiac leads. IMPRESSION: Hyperinflation with chronic changes. Electronically Signed   By: Ranell Bring M.D.   On: 02/11/2024 14:43    Time coordinating  discharge: 55 mins  SIGNED:  Camellia Door, DO [facility] 02/13/24, 3:01 PM

## 2024-02-13 NOTE — Progress Notes (Signed)
 PROGRESS NOTE    Helayne DELENA Comer Mickey.  FMW:[id number] DOB: Jul 24, 1941 DOA: 02/11/2024 PCP: Pcp, No  Subjective: Pt seen and examined. Met with pt, wife, son and dtr in law at bedside. Confirmed with hospice rep that pt has Bipap on his OSA machine at home. Repeat VBG this AM after he was on bipap most of the night shows VBG pH of 7.4, PCO2 of 73. Consistent with chronic hypercapnic respiratory failure.  [employer] has brought liquid O2 tank for him to go home on.  Reviewed procedure with family. Pt needs to be wearing his liquid portable O2 and have it turned on to 4-6 L/min while still he is in hospital bed. He should also be wearing hospital [place] connected to wall O2.  After both O2 sources are confirmed to be in his nose and turned on, he can transfer to [facility]. Give him 5 mins to rest and then turn off hospital [place] O2 source.  Then he can transport to his private care.  Confirmed with hospice rep that RT will be at pt's home this evening or tomorrow to verify his home Bipap settings and bring different masks for him to try(nasal pillows and full face mask).   Hospital Course: No notes on file   Assessment and Plan: * AMS (altered mental status) 02-12-2024 due to hypercapnia. Mentation improved when his PCO2 down to 71 mmHg today.  02-13-2024 due to hypercapnic respiratory failure. Now resolved.  Acute on chronic respiratory failure with hypoxia and hypercapnia (HCC) 02-12-2024 admitted to acute on chronic hypercapnia and CO2 narcosis.  Tolerated bipap. Repeat VBG showed pH 7.38, PCO2 of 71. He probably lives with his PCO2 around high 70s all the time. Stressed importance of keeping his O2 sats 88-92%. His hypoxia is what drives his ventilation. Discussed with wife and son that if pt keeps his O2 sats >93%, he could stop breathing. Wife not sure if pt has cpap or bipap. She thinks cpap. Pt thinks he has bipap. Either way, he does not wear if because he does not like the way the mask fits on his face.  Discussed with hospice rep. They will try and get pt nasal pillow and a full face mask to see if either increases his compliance. Pt has tried a mask that just covers his nose/mouth and only his mouth. Both of which he says are very uncomfortable for him.  He is stable for DC. Family and pt wants him to go home today. Continue to f/u with hospice care.  02-13-2024 Confirmed with hospice rep that pt has Bipap on his OSA machine at home. Repeat VBG this AM after he was on bipap most of the night shows VBG pH of 7.4, PCO2 of 73. Consistent with chronic hypercapnic respiratory failure. [employer] has brought liquid O2 tank for him to go home on.   End stage COPD (HCC) 02-13-2024 pt has end stage COPD and is active patient with hospice.  Chronic respiratory failure with hypoxia and hypercapnia (HCC) - baseline O2 requirement 4-6 L/min.  keep O2 sats 88-92%. baseline PCO2 low 70s mmHg 02-12-2024 He probably lives with his PCO2 around high 70s all the time. Stressed importance of keeping his O2 sats 88-92%. His hypoxia is what drives his ventilation. Discussed with wife and son that if pt keeps his O2 sats >93%, he could stop breathing.   02-13-2024 baseline PCO2 of low 70s.  Keep O2 sats between 88-92%. On 3-6 liters per min of O2.  DNR (do not resuscitate)/DNI(Do  Not Intubate) 02-12-2024 continue DNR/DNI status. Discussed with pt, wife and son at bedside.  02-13-2024 continue with DNR/DNI status.  Hospice care patient 02-12-2024 pt continues with hospice care at home.  02-13-2024 f.u with hospice   DVT prophylaxis:   SCDs   Code Status: Limited: Do not attempt resuscitation (DNR) -DNR-LIMITED -Do Not Intubate/DNI  Family Communication: discussed with pt, wife, son and dtr-in-law at bedside Disposition Plan: return home Reason for continuing need for hospitalization: stable for DC.  Objective: Vitals:   02/13/24 0358 02/13/24 0835 02/13/24 0846 02/13/24 1141  BP: (!) 115/95 (!) 121/56  116/67   Pulse: 98 100 96 (!) 103  Resp: (!) 23 (!) 21 (!) 21 16  Temp: 98.4 F (36.9 C) 98 F (36.7 C)  98 F (36.7 C)  TempSrc: Oral Oral  Oral  SpO2: 100% 93%  94%    Intake/Output Summary (Last 24 hours) at 02/13/2024 1317 Last data filed at 02/13/2024 1140 Gross per 24 hour  Intake 240 ml  Output 870 ml  Net -630 ml   There were no vitals filed for this visit.  Examination:  Physical Exam Vitals and nursing note reviewed.  Constitutional:      General: He is not in acute distress.    Appearance: He is not toxic-appearing or diaphoretic.     Comments: Chronically ill appearing  HENT:     Head: Normocephalic and atraumatic.     Nose: Nose normal.  Eyes:     General: No scleral icterus. Cardiovascular:     Rate and Rhythm: Normal rate and regular rhythm.  Pulmonary:     Effort: Pulmonary effort is normal.  Abdominal:     General: Bowel sounds are normal.     Palpations: Abdomen is soft.  Musculoskeletal:     Right lower leg: No edema.     Left lower leg: No edema.  Skin:    General: Skin is warm and dry.     Capillary Refill: Capillary refill takes less than 2 seconds.  Neurological:     Mental Status: He is alert and oriented to person, place, and time.     Data Reviewed: I have personally reviewed following labs and imaging studies  CBC: Recent Labs  Lab 02/11/24 1404 02/11/24 1409 02/12/24 0556  WBC 11.1*  --  11.8*  NEUTROABS 8.6*  --   --   HGB 11.1* 12.2* 9.3*  HCT 38.1* 36.0* 31.2*  MCV 95.3  --  94.8  PLT 364  --  294   Basic Metabolic Panel: Recent Labs  Lab 02/11/24 1404 02/11/24 1409 02/11/24 1820 02/12/24 0556  NA 140 137  --  142  K 5.1 5.1  --  4.5  CL 88*  --   --  91*  CO2 42*  --   --  39*  GLUCOSE 187*  --   --  91  BUN 23  --   --  19  CREATININE 1.27*  --   --  1.06  CALCIUM  9.0  --   --  8.8*  MG  --   --  2.0  --    GFR: CrCl cannot be calculated (Unknown ideal weight.). Liver Function Tests: Recent Labs  Lab  02/12/24 0556  AST 21  ALT 14  ALKPHOS 53  BILITOT 0.7  PROT 5.5*  ALBUMIN 2.6*   Coagulation Profile: Recent Labs  Lab 02/11/24 1404  INR 1.1   Cardiac Enzymes: Recent Labs  Lab 02/11/24 1800  CKTOTAL  47*   BNP (last 3 results) Recent Labs    02/11/24 1404  BNP 187.3*   CBG: Recent Labs  Lab 02/11/24 1351 02/12/24 1731  GLUCAP 185* 99   Lipid Profile: No results for input(s): CHOL, HDL, LDLCALC, TRIG, CHOLHDL, LDLDIRECT in the last 72 hours. Thyroid Function Tests: No results for input(s): TSH, T4TOTAL, FREET4, T3FREE, THYROIDAB in the last 72 hours. Anemia Panel: No results for input(s): VITAMINB12, FOLATE, FERRITIN, TIBC, IRON, RETICCTPCT in the last 72 hours. Sepsis Labs: Recent Labs  Lab 02/11/24 1409 02/11/24 1828  LATICACIDVEN 1.9 0.7    Recent Results (from the past 240 hours)  Resp panel by RT-PCR (RSV, Flu A&B, Covid) Anterior Nasal Swab     Status: None   Collection Time: 02/11/24  1:57 PM   Specimen: Anterior Nasal Swab  Result Value Ref Range Status   SARS Coronavirus 2 by RT PCR NEGATIVE NEGATIVE Final   Influenza A by PCR NEGATIVE NEGATIVE Final   Influenza B by PCR NEGATIVE NEGATIVE Final    Comment: (NOTE) The Xpert Xpress SARS-CoV-2/FLU/RSV plus assay is intended as an aid in the diagnosis of influenza from Nasopharyngeal swab specimens and should not be used as a sole basis for treatment. Nasal washings and aspirates are unacceptable for Xpert Xpress SARS-CoV-2/FLU/RSV testing.  Fact Sheet for Patients: BloggerCourse.com  Fact Sheet for Healthcare Providers: SeriousBroker.it  This test is not yet approved or cleared by the [place]  FDA and has been authorized for detection and/or diagnosis of SARS-CoV-2 by FDA under an Emergency Use Authorization (EUA). This EUA will remain in effect (meaning this test can be used) for the duration of  the COVID-19 declaration under Section 564(b)(1) of the Act, 21 U.S.C. section 360bbb-3(b)(1), unless the authorization is terminated or revoked.     Resp Syncytial Virus by PCR NEGATIVE NEGATIVE Final    Comment: (NOTE) Fact Sheet for Patients: BloggerCourse.com  Fact Sheet for Healthcare Providers: SeriousBroker.it  This test is not yet approved or cleared by the [place]  FDA and has been authorized for detection and/or diagnosis of SARS-CoV-2 by FDA under an Emergency Use Authorization (EUA). This EUA will remain in effect (meaning this test can be used) for the duration of the COVID-19 declaration under Section 564(b)(1) of the Act, 21 U.S.C. section 360bbb-3(b)(1), unless the authorization is terminated or revoked.  Performed at [facility], [address], [place], [place] [address]   Culture, blood (routine x 2)     Status: None (Preliminary result)   Collection Time: 02/11/24  2:04 PM   Specimen: BLOOD  Result Value Ref Range Status   Specimen Description BLOOD LEFT ANTECUBITAL  Final   Special Requests   Final    BOTTLES DRAWN AEROBIC AND ANAEROBIC Blood Culture adequate volume   Culture   Final    NO GROWTH 2 DAYS Performed at [facility], [address], [place], [place] [address]    Report Status PENDING  Incomplete  Culture, blood (routine x 2)     Status: None (Preliminary result)   Collection Time: 02/11/24  2:14 PM   Specimen: BLOOD LEFT WRIST  Result Value Ref Range Status   Specimen Description BLOOD LEFT WRIST  Final   Special Requests   Final    BOTTLES DRAWN AEROBIC AND ANAEROBIC Blood Culture adequate volume   Culture   Final    NO GROWTH 2 DAYS Performed at [facility], [address] [place], [place] [address]  Report Status PENDING  Incomplete     Radiology Studies: VAS US  LOWER EXTREMITY VENOUS (DVT) Result Date: 02/12/2024  Lower Venous DVT Study Patient Name:   Ringo Sherod.  Date of Exam:   02/12/2024 Medical Rec #: [id number]             Accession #:    [id number] Date of Birth: [birth year]             Patient Gender: M Patient Age:   83 years Exam Location:  [facility] Procedure:      VAS US  LOWER EXTREMITY VENOUS (DVT) Referring Phys: EKTA PATEL --------------------------------------------------------------------------------  Indications: Pain, in lower legs, Swelling, Edema, and SOB.  Comparison Study: No priors. Performing Technologist: Ricka Sturdivant-Jones RDMS, RVT  Examination Guidelines: A complete evaluation includes B-mode imaging, spectral Doppler, color Doppler, and power Doppler as needed of all accessible portions of each vessel. Bilateral testing is considered an integral part of a complete examination. Limited examinations for reoccurring indications may be performed as noted. The reflux portion of the exam is performed with the patient in reverse Trendelenburg.  +---------+---------------+---------+-----------+----------+--------------+ RIGHT    CompressibilityPhasicitySpontaneityPropertiesThrombus Aging +---------+---------------+---------+-----------+----------+--------------+ CFV      Full           Yes      Yes                                 +---------+---------------+---------+-----------+----------+--------------+ SFJ      Full                                                        +---------+---------------+---------+-----------+----------+--------------+ FV Prox  Full                                                        +---------+---------------+---------+-----------+----------+--------------+ FV Mid   Full                                                        +---------+---------------+---------+-----------+----------+--------------+ FV DistalFull                                                        +---------+---------------+---------+-----------+----------+--------------+ PFV       Full                                                        +---------+---------------+---------+-----------+----------+--------------+ POP      Full           Yes      Yes                                 +---------+---------------+---------+-----------+----------+--------------+  PTV      Full                                                        +---------+---------------+---------+-----------+----------+--------------+ PERO     Full                                                        +---------+---------------+---------+-----------+----------+--------------+   +---------+---------------+---------+-----------+----------+--------------+ LEFT     CompressibilityPhasicitySpontaneityPropertiesThrombus Aging +---------+---------------+---------+-----------+----------+--------------+ CFV      Full           Yes      Yes                                 +---------+---------------+---------+-----------+----------+--------------+ SFJ      Full                                                        +---------+---------------+---------+-----------+----------+--------------+ FV Prox  Full                                                        +---------+---------------+---------+-----------+----------+--------------+ FV Mid   Full                                                        +---------+---------------+---------+-----------+----------+--------------+ FV DistalFull                                                        +---------+---------------+---------+-----------+----------+--------------+ PFV      Full                                                        +---------+---------------+---------+-----------+----------+--------------+ POP      Full           Yes      Yes                                 +---------+---------------+---------+-----------+----------+--------------+ PTV      Full                                                         +---------+---------------+---------+-----------+----------+--------------+  PERO     Full                                                        +---------+---------------+---------+-----------+----------+--------------+ Incidental finding - occluded left SFA.    Summary: RIGHT: - There is no evidence of deep vein thrombosis in the lower extremity.  - No cystic structure found in the popliteal fossa.  LEFT: - There is no evidence of deep vein thrombosis in the lower extremity.  - A cystic structure is found in the popliteal fossa. - Occluded left SFA was noted.  *See table(s) above for measurements and observations.    Preliminary    CT HEAD WO CONTRAST ( ) Result Date: 02/12/2024 CLINICAL DATA:  Mental status change. 8 HOURS FROM FIRST STUDY PLEASE RETIME. EXAM: CT HEAD WITHOUT CONTRAST TECHNIQUE: Contiguous axial images were obtained from the base of the skull through the vertex without intravenous contrast. RADIATION DOSE REDUCTION: This exam was performed according to the departmental dose-optimization program which includes automated exposure control, adjustment of the mA and/or kV according to patient size and/or use of iterative reconstruction technique. COMPARISON:  CT head 02/11/2024. FINDINGS: Brain: No change in possible trace bilateral subdural fluid collections. No convincing subarachnoid hemorrhage. No evidence of acute infarction, hydrocephalus, extra-axial collection or mass lesion/mass effect. Patchy white matter hypodensities are nonspecific but compatible with chronic microvascular ischemic change. Vascular: No hyperdense vessel identified. Skull: No acute fracture. Sinuses/Orbits: Mostly clear sinuses.  No acute orbital findings. IMPRESSION: 1. No change in possible trace low density bilateral subdural hematomas. No mass effect. 2. No convincing subarachnoid hemorrhage. 3. No new/interval acute abnormality. Electronically Signed   By: Gilmore GORMAN Molt M.D.   On:  02/12/2024 02:21   CT Head Wo Contrast Result Date: 02/11/2024 EXAM: CT HEAD WITHOUT CONTRAST 02/11/2024 06:06:00 PM TECHNIQUE: CT of the head was performed without the administration of intravenous contrast. Automated exposure control, iterative reconstruction, and/or weight based adjustment of the mA/kV was utilized to reduce the radiation dose to as low as reasonably achievable. COMPARISON: None available. CLINICAL HISTORY: Mental status change, unknown cause. FINDINGS: BRAIN AND VENTRICLES: Generalized cerebral atrophy. Advanced chronic small vessel ischemic disease. Suspected small bilateral subdural hematomas measuring 4 mm in radial dimension on the left and 3 mm on the right (see series 5, image 46). Minimal, if any, mass effect. No midline shift. Hyperdensity deep to the subdural hematomas may be artifactual or represent a small amount of subarachnoid hemorrhage. ORBITS: No acute abnormality. SINUSES: No acute abnormality. SOFT TISSUES AND SKULL: No acute soft tissue abnormality. No skull fracture. IMPRESSION: 1. Suspected small bilateral subdural hematomas (4 mm left, 3 mm right) with minimal if any mass effect and no midline shift. Associated deep hyperdensity may be artifactual or represent a small amount of subarachnoid hemorrhage. 2. Generalized cerebral atrophy and advanced chronic small vessel ischemic disease. Critical results reported by telephone at 7:00 pm onJul 15 2025 to Dr. Tobie who verbalized acknowledged these results Electronically signed by: Norman Gatlin MD 02/11/2024 07:02 PM EDT RP Workstation: [id number]   CT Angio Chest PE W and/or Wo Contrast Result Date: 02/11/2024 CLINICAL DATA:  Pulmonary embolism (PE) suspected, high prob EXAM: CT ANGIOGRAPHY CHEST WITH CONTRAST TECHNIQUE: Multidetector CT imaging of the chest was performed using the standard protocol during bolus administration of intravenous contrast. Multiplanar  CT image reconstructions and MIPs were obtained to  evaluate the vascular anatomy. RADIATION DOSE REDUCTION: This exam was performed according to the departmental dose-optimization program which includes automated exposure control, adjustment of the mA and/or kV according to patient size and/or use of iterative reconstruction technique. CONTRAST:  75mL OMNIPAQUE  IOHEXOL  350 MG/ML SOLN COMPARISON:  Chest radiograph same date. Chest CTA 05/31/2023. Chest CT 11/07/2022. FINDINGS: Cardiovascular: The pulmonary arteries are well opacified with contrast to the level of the segmental branches. There is no evidence of acute pulmonary embolism. Diffuse atherosclerosis of the aorta, great vessels and coronary arteries. No acute systemic arterial abnormalities are identified. There are calcifications of the aortic valve and lipomatous hypertrophy of the interatrial septum. The heart size is normal. There is no pericardial effusion. Mediastinum/Nodes: Similar mildly prominent hilar and subcarinal lymph nodes, likely reactive. No progressive thoracic adenopathy. The thyroid gland, trachea and esophagus demonstrate no significant findings. Lungs/Pleura: New small left to the right pleural effusions with increased central airway thickening and mucous plugging in both lower lobes. Moderate centrilobular and paraseptal emphysema. New band like opacity in the superior segment of the right lower lobe measuring up to 1.6 cm transverse on image 84/6. There is also new clustered nodularity within the lingula (image 107/6). Other scattered pulmonary nodules bilaterally are similar to previous CTs, including a solid left lower lobe nodule measuring 1.1 x 0.9 cm on image 96/6. Upper abdomen: The visualized upper abdomen appears stable, without acute or significant findings. Aortic and branch vessel atherosclerosis. Musculoskeletal/Chest wall: There is no chest wall mass or suspicious osseous finding. 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 Review of  the MIP images confirms the above findings. IMPRESSION: 1. No evidence of acute pulmonary embolism or other acute vascular findings. 2. New small left to the right pleural effusions with increased central airway thickening and mucous plugging in both lower lobes, likely due to bronchitis. 3. New band like opacity in the superior segment of the right lower lobe and clustered nodularity in the lingula, likely infectious/inflammatory. Consider follow-up, as clinically warranted. 4. Other scattered pulmonary nodules bilaterally are similar to previous CT's. 5. Aortic Atherosclerosis (ICD10-I70.0) and Emphysema (ICD10-J43.9). Electronically Signed   By: Elsie Perone M.D.   On: 02/11/2024 18:36   DG Chest Port 1 View Result Date: 02/11/2024 CLINICAL DATA:  Shortness of breath EXAM: PORTABLE CHEST 1 VIEW COMPARISON:  X-ray 10/21/2023 and older FINDINGS: Hyperinflation. Chronic lung changes. No consolidation, pneumothorax or effusion. No edema. Normal cardiopericardial silhouette. Degenerative changes along the spine. Overlapping cardiac leads. IMPRESSION: Hyperinflation with chronic changes. Electronically Signed   By: Ranell Bring M.D.   On: 02/11/2024 14:43    Scheduled Meds:  budesonide  (PULMICORT ) nebulizer solution  0.25 mg Nebulization BID   diltiazem   120 mg Oral Daily   dofetilide   250 mcg Oral BID   finasteride   5 mg Oral q AM   gabapentin   200 mg Oral QHS   ipratropium-albuterol   3 mL Nebulization Q6H   pantoprazole   40 mg Oral q AM   rosuvastatin   20 mg Oral Daily   sodium chloride  flush  3 mL Intravenous Q12H   Continuous Infusions:   LOS: 0 days   Time spent: 55 minutes  Camellia Door, DO  [facility]  02/13/2024, 1:17 PM

## 2024-02-13 NOTE — Assessment & Plan Note (Signed)
 02-13-2024 pt has end stage COPD and is active patient with hospice.

## 2024-02-13 NOTE — Assessment & Plan Note (Signed)
 Nutrition Status: Nutrition Problem: Severe Malnutrition Etiology: chronic illness, decreased appetite Signs/Symptoms: severe muscle depletion, severe fat depletion, energy intake < 75% for > or equal to 3 months Interventions: Refer to RD note for recommendations

## 2024-02-13 NOTE — TOC Initial Note (Addendum)
 Transition of Care (TOC) - Initial/Assessment Note    Patient Details  Name: [name] [name]. MRN: [id number] Date of Birth: 1940/08/15  Transition of Care [facility]) CM/SW Contact:    Sudie Erminio Deems, RN Phone Number: 02/13/2024, 10:45 AM  Clinical Narrative: Patient became unresponsive as he was discharging home. PTA patient was from [facility] in [place] with his spouse. Patient is currently active with [facility] for [facility]. Patient has DME CPAP/BIPAP, wheelchair, bedside commode, and rollator. Spouse states they will have to make some room for a hospital bed. Case Manager received a message that the patient will need liquid oxygen delivered to the hospital- [facility] is aware and will have [employer] to deliver. Spouse states patient can travel home via private vehicle once stable to transition home. No further needs identified at this time.                    1056 02-13-24 Patient will not have outpatient PT services with Hospice at this time. Per Liaison, Hospice Services will need to be revoked if patient requires PT/OT. MD is aware.   1228 02-13-24 Shawn states Liquid oxygen will now be delivered by [employer]. No further needs identified at this time.  1450 02-13-24 Patient will transition home now via ambulance- spouse is agreeable for [facility] to transport home. ETA unavailable at this time; staff and RN are aware.  Expected Discharge Plan: Home w Hospice Care Barriers to Discharge: Continued Medical Work up   Patient Goals and CMS Choice Patient states their goals for this hospitalization and ongoing recovery are:: Plan to return home with Hospice Services.          Expected Discharge Plan and Services In-house Referral: NA Discharge Planning Services: CM Consult Post Acute Care Choice: Hospice Living arrangements for the past 2 months: Independent Living Facility Expected Discharge Date: 02/12/24                  DME Agency: NA       HH Arranged: RN, Disease Management HH Agency:  Engineering geologist) Date HH Agency Contacted: 02/13/24 Time HH Agency Contacted: 1043 Representative spoke with at [facility] Agency: Shawn  Prior Living Arrangements/Services Living arrangements for the past 2 months: Independent Living Facility Lives with:: Spouse Patient language and need for interpreter reviewed:: Yes Do you feel safe going back to the place where you live?: Yes      Need for Family Participation in Patient Care: Yes (Comment) Care giver support system in place?: Yes (comment) Current home services: DME (CPAP/BIPAP, WC, BSC, Rollator) Criminal Activity/Legal Involvement Pertinent to Current Situation/Hospitalization: No - Comment as needed  Activities of Daily Living      Permission Sought/Granted Permission sought to share information with : Family Supports, Magazine features editor, Case Estate manager/land agent granted to share information with : Yes, Verbal Permission Granted     Permission granted to share info w AGENCY: Scientist, water quality        Emotional Assessment Appearance:: Appears stated age Attitude/Demeanor/Rapport: Engaged Affect (typically observed): Appropriate Orientation: : Oriented to Self, Oriented to Place, Oriented to  Time, Oriented to Situation Alcohol / Substance Use: Not Applicable Psych Involvement: No (comment)  Admission diagnosis:  Acute respiratory failure with hypoxia and hypercapnia (HCC) [J96.01, J96.02] Acute on chronic respiratory failure with hypoxia and hypercapnia (HCC) [J96.21, J96.22] AMS (altered mental status) [R41.82] Patient Active Problem List   Diagnosis Date Noted  End stage COPD (HCC) 02/13/2024   Chronic respiratory failure with hypoxia and hypercapnia (HCC) - baseline O2 requirement 4-6 L/min.  keep O2 sats 88-92%. baseline PCO2 low 70s mmHg 02/12/2024   Acute on chronic respiratory failure with hypoxia and  hypercapnia (HCC) 02/12/2024   Hospice care patient 02/12/2024   DNR (do not resuscitate)/DNI(Do Not Intubate) 02/12/2024   AMS (altered mental status) 02/11/2024   PCP:  Freddrick, No Pharmacy:   [facility] - [place], [place] - [address] [place] [address] Phone: [phone] Fax: [phone]     Social Drivers of Health (SDOH) Social History: SDOH Screenings   Food Insecurity: No Food Insecurity (02/13/2024)  Housing: Unknown (02/13/2024)  Transportation Needs: No Transportation Needs (02/13/2024)  Utilities: Not At Risk (02/13/2024)   SDOH Interventions:     Readmission Risk Interventions     No data to display

## 2024-02-13 NOTE — Progress Notes (Signed)
 Initial Nutrition Assessment  DOCUMENTATION CODES:   Severe malnutrition in context of chronic illness  INTERVENTION:  -Continue regular menu, regular texture, thin liquids diet -Add Ensure Plus High Protein BID -Add Magic Cup TID -Add MVI w/min -Encouraged pt to eat whatever he wanted, pt states he typically enjoys milkshakes and ice cream.    NUTRITION DIAGNOSIS:   Severe Malnutrition related to chronic illness, decreased appetite as evidenced by severe muscle depletion, severe fat depletion, energy intake < 75% for > or equal to 3 months.  GOAL:   Goal of nutrition to support comfort and quality of life through liberalized nutrition care focused on patient preferences and enjoyment of food.    MONITOR:   PO intake, Supplement acceptance, Labs, Weight trends, Skin  REASON FOR ASSESSMENT:   Consult Assessment of nutrition requirement/status  ASSESSMENT:   Hx COPD on home O2 (on Hospice; DNR), CAD, HTN, PVD, afib (on Eliquis ), GERD, and neuropathy. Pt was d/c yesterday when he became unresponsive and a code blue was called. Pt readmitted for stabilization.  Spoke to pt and wife in room. Pt denies n/v/c/d or chewing/swallowing difficulties at this time. Pt is on hospice care at baseline, requires home O2, BiPAP. Nutrition consult for nutrition goals. Goal of nutrition to support comfort and quality of life through liberalized nutrition care focused on patient preferences and enjoyment of food. Pt likes to drink milkshakes and eat ice cream at home, encouraged pt to continue to do so. NFPE performed. SPCM. +supplements while here, though pt may d/c today. No further nutrition interventions planned at this time, will sign off. Please re-consult as needed.    NUTRITION - FOCUSED PHYSICAL EXAM:  Flowsheet Row Most Recent Value  Orbital Region Moderate depletion  Upper Arm Region Moderate depletion  Thoracic and Lumbar Region Severe depletion  Buccal Region Moderate depletion   Temple Region Moderate depletion  Clavicle Bone Region Severe depletion  Clavicle and Acromion Bone Region Severe depletion  Scapular Bone Region Severe depletion  Dorsal Hand Moderate depletion  Patellar Region Severe depletion  Anterior Thigh Region Severe depletion  Posterior Calf Region Severe depletion  Edema (RD Assessment) None  Hair Reviewed  Eyes Reviewed  Mouth Reviewed  Skin Reviewed  Nails Reviewed    Diet Order:   Diet Order             Diet regular Room service appropriate? Yes; Fluid consistency: Thin  Diet effective now           Diet - low sodium heart healthy                   EDUCATION NEEDS:   Education needs have been addressed  Skin:  Skin Assessment: Reviewed RN Assessment  Last BM:  7/15  Height:   Ht Readings from Last 1 Encounters:  No data found for Ht  5'10 per pt  Weight:   Wt Readings from Last 1 Encounters:  No data found for Wt  150 lbs per pt  BMI: 20.0  Estimated Nutritional Needs:   Kcal:  1700-2050 kcal  Protein:  80-100 g  Fluid:  >1500 ml    Aileana Hodder Daml-Budig, RDN, LDN Registered Dietitian Nutritionist RD Inpatient Contact Info in [place]

## 2024-02-14 ENCOUNTER — Encounter: Payer: Self-pay | Admitting: Podiatry

## 2024-02-16 LAB — CULTURE, BLOOD (ROUTINE X 2)
Culture: NO GROWTH
Culture: NO GROWTH
Special Requests: ADEQUATE
Special Requests: ADEQUATE

## 2024-02-17 ENCOUNTER — Non-Acute Institutional Stay (SKILLED_NURSING_FACILITY): Admitting: Student

## 2024-02-17 ENCOUNTER — Encounter: Payer: Self-pay | Admitting: Student

## 2024-02-17 DIAGNOSIS — I4892 Unspecified atrial flutter: Secondary | ICD-10-CM | POA: Diagnosis not present

## 2024-02-17 DIAGNOSIS — J9612 Chronic respiratory failure with hypercapnia: Secondary | ICD-10-CM

## 2024-02-17 DIAGNOSIS — I2510 Atherosclerotic heart disease of native coronary artery without angina pectoris: Secondary | ICD-10-CM

## 2024-02-17 DIAGNOSIS — J9611 Chronic respiratory failure with hypoxia: Secondary | ICD-10-CM | POA: Diagnosis not present

## 2024-02-19 ENCOUNTER — Telehealth: Payer: Self-pay

## 2024-02-19 NOTE — Telephone Encounter (Signed)
 Received CMN from [facility] for Portable gaseous oxygen  system; rental.  CMN prepped and placed on Beth's desk in B POD for signature.  Once signed, will fax to [employer] at [phone].  Faxed to [employer] at [phone].

## 2024-02-27 ENCOUNTER — Telehealth: Payer: Self-pay | Admitting: *Deleted

## 2024-02-27 NOTE — Telephone Encounter (Signed)
 CMN regaxed to [employer], received fax confirmation that fax was sent successfully.  Sent to scan.  Nothing further needed.

## 2024-02-28 NOTE — Progress Notes (Signed)
 Location:  Other [facility]) Nursing Home Room Number: [place] 111-A Place of Service:  SNF (31) [facility])  Provider:  Abdul Fine, MD  PCP: Pcp, No Patient Care Team: Pcp, No as PCP - General Darron Deatrice LABOR, MD as PCP - Cardiology (Cardiology) Cindie Ole DASEN, MD as PCP - Electrophysiology (Cardiology) Jimmy Charlie FERNS, MD (Internal Medicine)  Extended Emergency Contact Information Primary Emergency Contact: [name] [name],[name] Address: [address]          [place], [place] [address] [place] Home Phone: [phone] Mobile Phone: [phone] Relation: Spouse Secondary Emergency Contact: [name] [name] [name] Mobile Phone: [phone] Relation: Spouse  Code Status: DNR Goals of care:  Advanced Directive information    02/17/2024   11:20 AM  Advanced Directives  Does Patient Have a Medical Advance Directive? Yes  Type of Advance Directive Out of facility DNR (pink MOST or yellow form)  Does patient want to make changes to medical advance directive? No - Patient declined  Pre-existing out of facility DNR order (yellow form or pink MOST form) Yellow form placed in chart (order not valid for inpatient use);Pink MOST form placed in chart (order not valid for inpatient use)     Allergies  Allergen Reactions  . Flagyl [Metronidazole] Other (See Comments)    Other reaction(s): skin reaction    Chief Complaint  Patient presents with  . Transitions Of Care    discharge    HPI:  83 y.o. male  admitted to the facility last night and now being discharged as he is deceased.   History of Present Illness He, with COPD, presents with increased difficulty managing his condition at home.  He has a history of chronic obstructive pulmonary disease (COPD) and was recently hospitalized last Tuesday. Despite his health challenges, he attempted to maintain his normal routine, including attending a dentist appointment.  Family members have been actively involved  in his care, with one daughter traveling to be with him and another family member assisting with transportation from the airport.  There is mention of medication management and disposal, with suggestions to contact the pharmacy for assistance in handling excess medications.   Past Medical History:  Diagnosis Date  . Abdominal discomfort 12/07/2020  . Acquired trigger finger of right middle finger 02/04/2020  . Acute prostatitis 04/08/2018  . Alcohol abuse   . Alcohol dependence (HCC) 12/07/2020  . Anal or rectal pain 04/08/2018  . Annual physical exam 12/07/2020  . Anorectal disorder 12/07/2020  . Arthritis   . Atherosclerotic heart disease of native coronary artery without angina pectoris 04/08/2018   a. 03/2018 PCI: LM nl, LAD 40m, LCX nl, OM1 70p (2.75x8 Synergy DES), 41m (2.5x24 Synergy DES), RCA 25p, RPL1 50. EF 55-65%.  . Atrial flutter (HCC)    a. 07/2022 in setting of hospitalization for resp illness/RSV. CHA2DS2VASc = 4-->eliquis ; b. 08/2022 s/p DCCV (100J); c. 09/26/2022 Recurrent Aflutter.  . Benign prostatic hyperplasia with lower urinary tract symptoms 12/07/2020  . BPH with elevated PSA   . 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 Carotid arterial disease (HCC)    a. 02/2022 Carotid U/S: 1-39% bilat ICA stenoses.  . Chronic obstructive pulmonary disease with (acute) exacerbation (HCC) 12/07/2020  . Chronic respiratory failure with hypoxia (HCC) 08/10/2019  . Chronic sinusitis 12/07/2020  . Cigarette nicotine dependence, uncomplicated 04/09/2018  . Coagulation disorder (HCC) 01/13/2019  . 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 Contracture of  palmar fascia 12/07/2020  . COPD (chronic obstructive pulmonary disease) (HCC)   . Corn of toe 12/07/2020  . Corns and callosities 04/09/2018  . Coronary artery disease    2019 with stents  . Coronary atherosclerosis 05/07/2018  . Degenerative disc disease, cervical  10/02/2021  . Degenerative disc disease, 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  . Diarrhea 04/09/2018  . Double vision with both eyes open 12/07/2020  . 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 Dysphagia 08/14/2018  . Dyspnea   . ED (erectile dysfunction)   . ED (erectile dysfunction) of organic origin 12/07/2020  . Elevated prostate specific antigen (PSA) 04/08/2018  . Elevated PSA   . Encounter for screening for other disorder 12/07/2020  . Enlarged prostate without lower urinary tract symptoms (luts) 04/09/2018  . Enthesopathy 12/07/2020  . Essential (primary) hypertension 04/08/2018  . Ex-smoker 04/09/2018  . Exertional dyspnea 04/02/2018  . 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 Flatulence 04/08/2018  . Gastro-esophageal reflux disease without esophagitis 04/08/2018  . GERD (gastroesophageal reflux disease)   . History of acute otitis externa 08/14/2018  . History of echocardiogram    a. 07/2022 Echo: EF 65-70%, no rwma, mild LVH, nl RV fxn.  . Hyperlipidemia   . Hypertension   . Hypoxemia 12/07/2020  . Kidney cysts   . Left knee pain 12/07/2020  . Low back pain 04/08/2018  . Male erectile dysfunction 04/09/2018  . Mixed hyperlipidemia 04/08/2018  . Nocturia more than twice per night 04/09/2018  . Osteoarthritis of first carpometacarpal joint 04/08/2018  . Osteoarthritis of knee 12/07/2020  . Other long term (current) drug therapy 12/07/2020  . Pain due to onychomycosis of toenail of left foot 07/20/2019  . Pain in joint 04/09/2018  . Pain in knee 04/09/2018  . Pain in unspecified knee 12/07/2020  . Pain of finger 04/09/2018  . Palpitations   . 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HCC) 12/07/2020  . Personal history of colonic polyps 12/07/2020  . Pneumonia   . Pneumothorax 04/12/2020  . Polypharmacy 04/09/2018  . Porokeratosis 01/13/2019  . 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HCC)   . 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  . Pulmonary nodules 06/08/2016  . Pure hypercholesterolemia 12/07/2020   . Renal cyst 12/07/2020  . Sleep disorder 04/08/2018  . Status post bronchoscopy 04/12/2020  . Tear of rotator cuff 04/09/2018  . Tobacco user 12/07/2020  . Uncomplicated alcohol dependence (HCC) 04/09/2018  . Unspecified rotator cuff tear or rupture of unspecified shoulder, not specified as traumatic 12/07/2020  . Unspecified tear of unspecified meniscus, current injury, unspecified knee, initial encounter 12/07/2020  . Visual disturbance 12/07/2020  . Vitamin D  deficiency 04/08/2018    Past Surgical History:  Procedure Laterality Date  . BRONCHIAL BIOPSY  10/13/2019   Procedure: BRONCHIAL BIOPSIES;  Surgeon: Shelah Lamar RAMAN, MD;  Location: [facility] ENDOSCOPY;  Service: Pulmonary;;  . BRONCHIAL BIOPSY  04/12/2020   Procedure: BRONCHIAL BIOPSIES;  Surgeon: Shelah Lamar RAMAN, MD;  Location: [facility] ENDOSCOPY;  Service: Pulmonary;;  . BRONCHIAL BRUSHINGS  10/13/2019   Procedure: BRONCHIAL BRUSHINGS;  Surgeon: Shelah Lamar RAMAN, MD;  Location: [facility] ENDOSCOPY;  Service: Pulmonary;;  . BRONCHIAL BRUSHINGS  04/12/2020   Procedure: BRONCHIAL BRUSHINGS;  Surgeon: Shelah Lamar RAMAN, MD;  Location: [facility] ENDOSCOPY;  Service: Pulmonary;;  . BRONCHIAL NEEDLE ASPIRATION BIOPSY  10/13/2019   Procedure: BRONCHIAL NEEDLE ASPIRATION BIOPSIES;  Surgeon: Shelah Lamar RAMAN, MD;  Location: [facility] ENDOSCOPY;  Service: Pulmonary;;  . BRONCHIAL NEEDLE ASPIRATION BIOPSY  04/12/2020   Procedure: BRONCHIAL NEEDLE ASPIRATION BIOPSIES;  Surgeon: Shelah Lamar  S, MD;  Location: [facility] ENDOSCOPY;  Service: Pulmonary;;  . BRONCHIAL WASHINGS  10/13/2019   Procedure: BRONCHIAL WASHINGS;  Surgeon: Shelah Lamar RAMAN, MD;  Location: [facility] ENDOSCOPY;  Service: Pulmonary;;  . BRONCHIAL WASHINGS  04/12/2020   Procedure: BRONCHIAL WASHINGS;  Surgeon: Shelah Lamar RAMAN, MD;  Location: [facility] ENDOSCOPY;  Service: Pulmonary;;  . CARDIAC CATHETERIZATION  2002   50% RCA  . CARDIOVERSION N/A 09/17/2022   Procedure: CARDIOVERSION;  Surgeon: Darron Deatrice LABOR, MD;  Location: [facility]  ORS;  Service: Cardiovascular;  Laterality: N/A;  . CATARACT EXTRACTION, BILATERAL    . CORONARY STENT INTERVENTION N/A 04/15/2018   Procedure: CORONARY STENT INTERVENTION;  Surgeon: Dann Candyce RAMAN, MD;  Location: [facility] INVASIVE CV LAB;  Service: Cardiovascular;  Laterality: N/A;  om1  . EYE SURGERY    . KNEE ARTHROSCOPY    . LEFT HEART CATH AND CORONARY ANGIOGRAPHY N/A 04/15/2018   Procedure: LEFT HEART CATH AND CORONARY ANGIOGRAPHY;  Surgeon: Dann Candyce RAMAN, MD;  Location: [facility] INVASIVE CV LAB;  Service: Cardiovascular;  Laterality: N/A;  . MULTIPLE TOOTH EXTRACTIONS    . TONSILLECTOMY    . VIDEO BRONCHOSCOPY  04/12/2020  . VIDEO BRONCHOSCOPY WITH ENDOBRONCHIAL NAVIGATION N/A 07/26/2016   Procedure: VIDEO BRONCHOSCOPY WITH ENDOBRONCHIAL NAVIGATION;  Surgeon: Lamar RAMAN Shelah, MD;  Location: [facility] OR;  Service: Thoracic;  Laterality: N/A;  . VIDEO BRONCHOSCOPY WITH ENDOBRONCHIAL NAVIGATION N/A 10/13/2019   Procedure: [facility] AND VIDEO BRONCHOSCOPY WITH ENDOBRONCHIAL NAVIGATION;  Surgeon: Shelah Lamar RAMAN, MD;  Location: [facility] ENDOSCOPY;  Service: Pulmonary;  Laterality: N/A;  . VIDEO BRONCHOSCOPY WITH ENDOBRONCHIAL NAVIGATION N/A 04/12/2020   Procedure: VIDEO BRONCHOSCOPY WITH ENDOBRONCHIAL NAVIGATION;  Surgeon: Shelah Lamar RAMAN, MD;  Location: [facility] ENDOSCOPY;  Service: Pulmonary;  Laterality: N/A;      reports that he quit smoking about 3 years ago. His smoking use included cigarettes. He started smoking about 71 years ago. He has a 51 pack-year smoking history. He has never been exposed to tobacco smoke. He has never used smokeless tobacco. He reports that he does not drink alcohol and does not use drugs. Social History   Socioeconomic History  . Marital status: Married    Spouse name: Not on file  . Number of children: 2  . Years of education: Not on file  . Highest education level: Master's degree (e.g., MA, MS, MEng, MEd, MSW, MBA)  Occupational History  . 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 Smoking status: Former    Current packs/day: 0.00    Average packs/day: 0.8 packs/day for 68.0 years (51.0 ttl pk-yrs)    Types: Cigarettes    Start date: 61    Quit date: 07/13/2020    Years since quitting: 3.6    Passive exposure: Never  . Smokeless tobacco: Never  . Tobacco comments:    Recent Quit    Vaping Use  . Vaping status: Former  Substance and Sexual Activity  . Alcohol use: No    Alcohol/week: 0.0 standard drinks of alcohol    Comment: quit drinking 2014  . Drug use: No  . Sexual activity: Not on file  Other Topics Concern  . Not on file  Social History Narrative   ** Merged History Encounter **       Has living will Wife is health care POA--alternate is son Audria) Has daughter in [place]  Has DNR No tube feeds if cognitively unaware   Social Drivers of Health  Financial Resource Strain: Low Risk  (01/06/2023)   Overall Financial Resource Strain (CARDIA)   . Difficulty of Paying Living Expenses: Not hard at all  Food Insecurity: No Food Insecurity (02/13/2024)   Hunger Vital Sign   . Worried About Programme researcher, broadcasting/film/video in the Last Year: Never true   . Ran Out of Food in the Last Year: Never true  Transportation Needs: No Transportation Needs (02/13/2024)   PRAPARE - Transportation   . Lack of Transportation (Medical): No   . Lack of Transportation (Non-Medical): No  Physical Activity: Sufficiently Active (01/06/2023)   Exercise Vital Sign   . Days of Exercise per Week: 4 days   . Minutes of Exercise per Session: 40 min  Stress: No Stress Concern Present (01/06/2023)   [employer] of Occupational Health - Occupational Stress Questionnaire   . Feeling of Stress : Not at all  Social Connections: Socially Isolated (10/21/2023)   Social Connection and Isolation Panel   . Frequency of Communication with Friends and Family: Never   . Frequency of Social Gatherings with Friends and Family: Once  a week   . Attends Religious Services: Never   . Active Member of Clubs or Organizations: No   . Attends Banker Meetings: Never   . Marital Status: Married  Catering manager Violence: Not At Risk (10/21/2023)   Humiliation, Afraid, Rape, and Kick questionnaire   . Fear of Current or Ex-Partner: No   . Emotionally Abused: No   . Physically Abused: No   . Sexually Abused: No   Functional Status Survey:    Allergies  Allergen Reactions  . Flagyl [Metronidazole] Other (See Comments)    Other reaction(s): skin reaction    Pertinent  Health Maintenance Due  Topic Date Due  . INFLUENZA VACCINE  02/28/2024    Medications: Outpatient Encounter Medications as of 02/17/2024  Medication Sig  . morphine  (ROXANOL) 20 MG/ML concentrated solution Take 0.25 mLs (5 mg total) by mouth daily as needed.  . Morphine  Sulfate (MORPHINE  CONCENTRATE) 10 mg / 0.5 ml concentrated solution Take 5 mg by mouth as needed for moderate pain (pain score 4-6) or severe pain (pain score 7-10).  . OXYGEN  Inhale into the lungs. 3-4 liters upon exertion and 3 liters continuous at night.  . acetaminophen  (TYLENOL ) 325 MG tablet Take 650 mg by mouth every 4 (four) hours as needed. (Patient not taking: Reported on 02/17/2024)  . Budeson-Glycopyrrol-Formoterol  (BREZTRI  AEROSPHERE) 160-9-4.8 MCG/ACT AERO Inhale 2 puffs into the lungs 2 (two) times daily. (Patient not taking: Reported on 02/17/2024)  . furosemide  (LASIX ) 20 MG tablet Take 20-40 mg by mouth daily. Give Alternating Dose of 20mg /40mg  by mouth once daily. (Patient not taking: Reported on 02/17/2024)  . ipratropium-albuterol  (DUONEB) 0.5-2.5 (3) MG/3ML SOLN Take 3 mLs by nebulization See admin instructions. Inhale 3mL by nebulization twice daily. May use extra if needed. (Patient not taking: Reported on 02/17/2024)  . [DISCONTINUED] albuterol  (VENTOLIN  HFA) 108 (90 Base) MCG/ACT inhaler INHALE 2 PUFFS 2-3 TIMES A DAY AS NEEDED FOR WHEEZING (30 DAYS) 30  DAY(S)  . [DISCONTINUED] apixaban  (ELIQUIS ) 5 MG TABS tablet Take 1 tablet (5 mg total) by mouth 2 (two) times daily.  . [DISCONTINUED] clonazePAM  (KLONOPIN ) 0.5 MG tablet TAKE 1 TABLET BY MOUTH EVERYDAY AT BEDTIME  . [DISCONTINUED] clonazePAM  (KLONOPIN ) 0.5 MG tablet Take 0.5 tablets by mouth in the morning, at noon, in the evening, and at bedtime.  . [DISCONTINUED] diltiazem  (CARDIZEM  CD) 120 MG 24 hr capsule  TAKE 1 CAPSULE BY MOUTH 2 TIMES DAILY.  . [DISCONTINUED] diltiazem  (CARDIZEM  CD) 120 MG 24 hr capsule Take 120 mg by mouth 2 (two) times daily.  . [DISCONTINUED] dofetilide  (TIKOSYN ) 250 MCG capsule Take 1 capsule (250 mcg total) by mouth 2 (two) times daily.  . [DISCONTINUED] dofetilide  (TIKOSYN ) 250 MCG capsule Take 250 mcg by mouth 2 (two) times daily.  . [DISCONTINUED] ELIQUIS  5 MG TABS tablet Take 5 mg by mouth 2 (two) times daily.  . [DISCONTINUED] finasteride  (PROSCAR ) 5 MG tablet Take 5 mg by mouth daily.  . [DISCONTINUED] finasteride  (PROSCAR ) 5 MG tablet Take 5 mg by mouth in the morning.  . [DISCONTINUED] gabapentin  (NEURONTIN ) 100 MG capsule Take 200 mg by mouth at bedtime.  . [DISCONTINUED] gabapentin  (NEURONTIN ) 100 MG capsule Take 200 mg by mouth at bedtime.  . [DISCONTINUED] guaiFENesin  (MUCINEX ) 600 MG 12 hr tablet Take 600 mg by mouth in the morning.  . [DISCONTINUED] ipratropium-albuterol  (DUONEB) 0.5-2.5 (3) MG/3ML SOLN INHALE 3 ML BY NEBULIZER EVERY 6 HOURS AS NEEDED  . [DISCONTINUED] Multiple Vitamins-Minerals (ONE A DAY MEN 50 PLUS PO) Take 1 tablet by mouth in the morning.  . [DISCONTINUED] nitroGLYCERIN  (NITROSTAT ) 0.4 MG SL tablet Place 1 tablet (0.4 mg total) under the tongue every 5 (five) minutes as needed for chest pain.  . [DISCONTINUED] pantoprazole  (PROTONIX ) 40 MG tablet Take 1 tablet (40 mg total) by mouth daily.  . [DISCONTINUED] pantoprazole  (PROTONIX ) 40 MG tablet Take 40 mg by mouth in the morning.  . [DISCONTINUED] polyethylene glycol (MIRALAX  /  GLYCOLAX ) 17 g packet Take 17 g by mouth daily as needed for moderate constipation.  . [DISCONTINUED] rosuvastatin  (CRESTOR ) 20 MG tablet Take 1 tablet (20 mg total) by mouth daily.  . [DISCONTINUED] rosuvastatin  (CRESTOR ) 20 MG tablet Take 20 mg by mouth daily.  . [DISCONTINUED] senna (SENOKOT) 8.6 MG TABS tablet Take 2 tablets by mouth at bedtime.  . [DISCONTINUED] sodium chloride  HYPERTONIC 3 % nebulizer solution Take by nebulization in the morning and at bedtime.  . [DISCONTINUED] ZYRTEC ALLERGY 10 MG tablet Take 10 mg by mouth in the morning.   No facility-administered encounter medications on file as of 02/17/2024.    Review of Systems  Vitals:   02/17/24 1121  BP: (!) 159/63  Pulse: 97  Resp: 18  Temp: 98.2 F (36.8 C)  SpO2: (!) 88%  Weight: 149 lb (67.6 kg)  Height: 5' 10.5 (1.791 m)   Body mass index is 21.08 kg/m. Physical Exam Constitutional:      Comments: Patient is deceased      Labs reviewed: Basic Metabolic Panel: Recent Labs    10/26/23 0435 10/27/23 0547 10/28/23 0326 02/11/24 1404 02/11/24 1409 02/11/24 1820 02/12/24 0556  NA 139 138 138 140 137  --  142  K 4.6 4.4 4.3 5.1 5.1  --  4.5  CL 98 98 97* 88*  --   --  91*  CO2 37* 33* 33* 42*  --   --  39*  GLUCOSE 107* 94 87 187*  --   --  91  BUN 29* 31* 27* 23  --   --  19  CREATININE 0.72 0.83 0.68 1.27*  --   --  1.06  CALCIUM  8.6* 8.4* 8.3* 9.0  --   --  8.8*  MG 2.1 2.1 2.1  --   --  2.0  --   PHOS 3.1 3.4 3.2  --   --   --   --  Liver Function Tests: Recent Labs    07/15/23 1114 08/23/23 1325 02/12/24 0556  AST  --  25 21  ALT  --  18 14  ALKPHOS  --  61 53  BILITOT  --  0.4 0.7  PROT  --  6.9 5.5*  ALBUMIN 3.8 3.6 2.6*   No results for input(s): LIPASE, AMYLASE in the last 8760 hours. No results for input(s): AMMONIA in the last 8760 hours. CBC: Recent Labs    08/23/23 1325 08/24/23 0430 09/01/23 0535 10/21/23 1212 10/28/23 0326 02/11/24 1404 02/11/24 1409  02/12/24 0556  WBC 13.5*   < > 13.4*   < > 10.4 11.1*  --  11.8*  NEUTROABS 12.2*  --  11.3*  --   --  8.6*  --   --   HGB 13.6   < > 12.0*   < > 10.4* 11.1* 12.2* 9.3*  HCT 43.0   < > 36.0*   < > 33.3* 38.1* 36.0* 31.2*  MCV 96.6   < > 91.4   < > 91.5 95.3  --  94.8  PLT 304   < > 282   < > 309 364  --  294   < > = values in this interval not displayed.   Cardiac Enzymes: Recent Labs    02/11/24 1800  CKTOTAL 47*   BNP: Invalid input(s): POCBNP CBG: Recent Labs    02/11/24 1351 02/12/24 1731  GLUCAP 185* 99    Procedures and Imaging Studies During Stay: VAS US  LOWER EXTREMITY VENOUS (DVT) Result Date: 02/12/2024  Lower Venous DVT Study Patient Name:  [name] [name] [name].  Date of Exam:   02/12/2024 Medical Rec #: [id number]             Accession #:    [id number] Date of Birth: [birth year]             Patient Gender: M Patient Age:   8 years Exam Location:  [facility] Procedure:      VAS US  LOWER EXTREMITY VENOUS (DVT) Referring Phys: EKTA PATEL --------------------------------------------------------------------------------  Indications: Pain, in lower legs, Swelling, Edema, and SOB.  Comparison Study: No priors. Performing Technologist: Ricka Sturdivant-Jones RDMS, RVT  Examination Guidelines: A complete evaluation includes B-mode imaging, spectral Doppler, color Doppler, and power Doppler as needed of all accessible portions of each vessel. Bilateral testing is considered an integral part of a complete examination. Limited examinations for reoccurring indications may be performed as noted. The reflux portion of the exam is performed with the patient in reverse Trendelenburg.  +---------+---------------+---------+-----------+----------+--------------+ RIGHT    CompressibilityPhasicitySpontaneityPropertiesThrombus Aging +---------+---------------+---------+-----------+----------+--------------+ CFV      Full           Yes      Yes                                  +---------+---------------+---------+-----------+----------+--------------+ SFJ      Full                                                        +---------+---------------+---------+-----------+----------+--------------+ FV Prox  Full                                                        +---------+---------------+---------+-----------+----------+--------------+  FV Mid   Full                                                        +---------+---------------+---------+-----------+----------+--------------+ FV DistalFull                                                        +---------+---------------+---------+-----------+----------+--------------+ PFV      Full                                                        +---------+---------------+---------+-----------+----------+--------------+ POP      Full           Yes      Yes                                 +---------+---------------+---------+-----------+----------+--------------+ PTV      Full                                                        +---------+---------------+---------+-----------+----------+--------------+ PERO     Full                                                        +---------+---------------+---------+-----------+----------+--------------+   +---------+---------------+---------+-----------+----------+--------------+ LEFT     CompressibilityPhasicitySpontaneityPropertiesThrombus Aging +---------+---------------+---------+-----------+----------+--------------+ CFV      Full           Yes      Yes                                 +---------+---------------+---------+-----------+----------+--------------+ SFJ      Full                                                        +---------+---------------+---------+-----------+----------+--------------+ FV Prox  Full                                                         +---------+---------------+---------+-----------+----------+--------------+ FV Mid   Full                                                        +---------+---------------+---------+-----------+----------+--------------+  FV DistalFull                                                        +---------+---------------+---------+-----------+----------+--------------+ PFV      Full                                                        +---------+---------------+---------+-----------+----------+--------------+ POP      Full           Yes      Yes                                 +---------+---------------+---------+-----------+----------+--------------+ PTV      Full                                                        +---------+---------------+---------+-----------+----------+--------------+ PERO     Full                                                        +---------+---------------+---------+-----------+----------+--------------+ Incidental finding - occluded left SFA.    Summary: RIGHT: - There is no evidence of deep vein thrombosis in the lower extremity.  - No cystic structure found in the popliteal fossa.  LEFT: - There is no evidence of deep vein thrombosis in the lower extremity.  - A cystic structure is found in the popliteal fossa. - Occluded left SFA was noted.  *See table(s) above for measurements and observations.    Preliminary    CT HEAD WO CONTRAST ( ) Result Date: 02/12/2024 CLINICAL DATA:  Mental status change. 8 HOURS FROM FIRST STUDY PLEASE RETIME. EXAM: CT HEAD WITHOUT CONTRAST TECHNIQUE: Contiguous axial images were obtained from the base of the skull through the vertex without intravenous contrast. RADIATION DOSE REDUCTION: This exam was performed according to the departmental dose-optimization program which includes automated exposure control, adjustment of the mA and/or kV according to patient size and/or use of iterative reconstruction  technique. COMPARISON:  CT head 02/11/2024. FINDINGS: Brain: No change in possible trace bilateral subdural fluid collections. No convincing subarachnoid hemorrhage. No evidence of acute infarction, hydrocephalus, extra-axial collection or mass lesion/mass effect. Patchy white matter hypodensities are nonspecific but compatible with chronic microvascular ischemic change. Vascular: No hyperdense vessel identified. Skull: No acute fracture. Sinuses/Orbits: Mostly clear sinuses.  No acute orbital findings. IMPRESSION: 1. No change in possible trace low density bilateral subdural hematomas. No mass effect. 2. No convincing subarachnoid hemorrhage. 3. No new/interval acute abnormality. Electronically Signed   By: Gilmore GORMAN Molt M.D.   On: 02/12/2024 02:21   CT Head Wo Contrast Result Date: 02/11/2024 EXAM: CT HEAD WITHOUT CONTRAST 02/11/2024 06:06:00 PM TECHNIQUE: CT of the head was performed without the administration of intravenous contrast. Automated exposure control, iterative reconstruction,  and/or weight based adjustment of the mA/kV was utilized to reduce the radiation dose to as low as reasonably achievable. COMPARISON: None available. CLINICAL HISTORY: Mental status change, unknown cause. FINDINGS: BRAIN AND VENTRICLES: Generalized cerebral atrophy. Advanced chronic small vessel ischemic disease. Suspected small bilateral subdural hematomas measuring 4 mm in radial dimension on the left and 3 mm on the right (see series 5, image 46). Minimal, if any, mass effect. No midline shift. Hyperdensity deep to the subdural hematomas may be artifactual or represent a small amount of subarachnoid hemorrhage. ORBITS: No acute abnormality. SINUSES: No acute abnormality. SOFT TISSUES AND SKULL: No acute soft tissue abnormality. No skull fracture. IMPRESSION: 1. Suspected small bilateral subdural hematomas (4 mm left, 3 mm right) with minimal if any mass effect and no midline shift. Associated deep hyperdensity may be  artifactual or represent a small amount of subarachnoid hemorrhage. 2. Generalized cerebral atrophy and advanced chronic small vessel ischemic disease. Critical results reported by telephone at 7:00 pm onJul 15 2025 to Dr. Tobie who verbalized acknowledged these results Electronically signed by: Norman Gatlin MD 02/11/2024 07:02 PM EDT RP Workstation: [id number]   CT Angio Chest PE W and/or Wo Contrast Result Date: 02/11/2024 CLINICAL DATA:  Pulmonary embolism (PE) suspected, high prob EXAM: CT ANGIOGRAPHY CHEST WITH CONTRAST TECHNIQUE: Multidetector CT imaging of the chest was performed using the standard protocol during bolus administration of intravenous contrast. Multiplanar CT image reconstructions and MIPs were obtained to evaluate the vascular anatomy. RADIATION DOSE REDUCTION: This exam was performed according to the departmental dose-optimization program which includes automated exposure control, adjustment of the mA and/or kV according to patient size and/or use of iterative reconstruction technique. CONTRAST:  75mL OMNIPAQUE  IOHEXOL  350 MG/ML SOLN COMPARISON:  Chest radiograph same date. Chest CTA 05/31/2023. Chest CT 11/07/2022. FINDINGS: Cardiovascular: The pulmonary arteries are well opacified with contrast to the level of the segmental branches. There is no evidence of acute pulmonary embolism. Diffuse atherosclerosis of the aorta, great vessels and coronary arteries. No acute systemic arterial abnormalities are identified. There are calcifications of the aortic valve and lipomatous hypertrophy of the interatrial septum. The heart size is normal. There is no pericardial effusion. Mediastinum/Nodes: Similar mildly prominent hilar and subcarinal lymph nodes, likely reactive. No progressive thoracic adenopathy. The thyroid  gland, trachea and esophagus demonstrate no significant findings. Lungs/Pleura: New small left to the right pleural effusions with increased central airway thickening and mucous  plugging in both lower lobes. Moderate centrilobular and paraseptal emphysema. New band like opacity in the superior segment of the right lower lobe measuring up to 1.6 cm transverse on image 84/6. There is also new clustered nodularity within the lingula (image 107/6). Other scattered pulmonary nodules bilaterally are similar to previous CTs, including a solid left lower lobe nodule measuring 1.1 x 0.9 cm on image 96/6. Upper abdomen: The visualized upper abdomen appears stable, without acute or significant findings. Aortic and branch vessel atherosclerosis. Musculoskeletal/Chest wall: There is no chest wall mass or suspicious osseous finding. 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 Review of the MIP images confirms the above findings. IMPRESSION: 1. No evidence of acute pulmonary embolism or other acute vascular findings. 2. New small left to the right pleural effusions with increased central airway thickening and mucous plugging in both lower lobes, likely due to bronchitis. 3. New band like opacity in the superior segment of the right lower lobe and clustered nodularity in the lingula, likely infectious/inflammatory. Consider follow-up, as clinically warranted. 4. Other scattered  pulmonary nodules bilaterally are similar to previous CT's. 5. Aortic Atherosclerosis (ICD10-I70.0) and Emphysema (ICD10-J43.9). Electronically Signed   By: Elsie Perone M.D.   On: 02/11/2024 18:36   DG Chest Port 1 View Result Date: 02/11/2024 CLINICAL DATA:  Shortness of breath EXAM: PORTABLE CHEST 1 VIEW COMPARISON:  X-ray 10/21/2023 and older FINDINGS: Hyperinflation. Chronic lung changes. No consolidation, pneumothorax or effusion. No edema. Normal cardiopericardial silhouette. Degenerative changes along the spine. Overlapping cardiac leads. IMPRESSION: Hyperinflation with chronic changes. Electronically Signed   By: Ranell Bring M.D.   On: 02/11/2024 14:43    Assessment/Plan:    Chronic Obstructive Pulmonary Disease (COPD) Severe COPD with recent hospitalization and significant functional decline, necessitating transition to hospice care. - Ensure appropriate medication management and disposal through pharmacy services  End-of-Life Care In end stages of life with focus on comfort and support for him and his family. Hospice provides essential grief counseling and support services. - Provide grief counseling and support services through hospice - Ensure completion of necessary documentation such as death certificate - Release body to the funeral home.   Patient is being discharged with the following home health services:    Patient is being discharged with the following durable medical equipment:    Patient has been advised to f/u with their PCP in 1-2 weeks to for a transitions of care visit.  Social services at their facility was responsible for arranging this appointment.  Pt was provided with adequate prescriptions of noncontrolled medications to reach the scheduled appointment .  For controlled substances, a limited supply was provided as appropriate for the individual patient.  If the pt normally receives these medications from a pain clinic or has a contract with another physician, these medications should be received from that clinic or physician only).    Future labs/tests needed:  none

## 2024-02-28 DEATH — deceased
